# Patient Record
Sex: Female | Born: 1955 | Hispanic: Yes | Smoking: Never smoker
Health system: Southern US, Community
[De-identification: ages and names within clinical notes are randomized; demographics above are authoritative.]

## PROBLEM LIST (undated history)

## (undated) DIAGNOSIS — L97509 Non-pressure chronic ulcer of other part of unspecified foot with unspecified severity: Secondary | ICD-10-CM

## (undated) DIAGNOSIS — I10 Essential (primary) hypertension: Secondary | ICD-10-CM

## (undated) DIAGNOSIS — H539 Unspecified visual disturbance: Secondary | ICD-10-CM

## (undated) DIAGNOSIS — K275 Chronic or unspecified peptic ulcer, site unspecified, with perforation: Secondary | ICD-10-CM

## (undated) DIAGNOSIS — K219 Gastro-esophageal reflux disease without esophagitis: Secondary | ICD-10-CM

## (undated) DIAGNOSIS — K279 Peptic ulcer, site unspecified, unspecified as acute or chronic, without hemorrhage or perforation: Secondary | ICD-10-CM

## (undated) DIAGNOSIS — R413 Other amnesia: Secondary | ICD-10-CM

## (undated) DIAGNOSIS — K269 Duodenal ulcer, unspecified as acute or chronic, without hemorrhage or perforation: Secondary | ICD-10-CM

## (undated) DIAGNOSIS — M359 Systemic involvement of connective tissue, unspecified: Secondary | ICD-10-CM

## (undated) DIAGNOSIS — C4490 Unspecified malignant neoplasm of skin, unspecified: Secondary | ICD-10-CM

## (undated) DIAGNOSIS — I2692 Saddle embolus of pulmonary artery without acute cor pulmonale: Secondary | ICD-10-CM

## (undated) HISTORY — DX: Saddle embolus of pulmonary artery without acute cor pulmonale: I26.92

## (undated) HISTORY — DX: Unspecified visual disturbance: H53.9

## (undated) HISTORY — DX: Other amnesia: R41.3

## (undated) HISTORY — PX: TONSILLECTOMY: SUR1361

## (undated) HISTORY — DX: Duodenal ulcer, unspecified as acute or chronic, without hemorrhage or perforation: K26.9

## (undated) HISTORY — DX: Peptic ulcer, site unspecified, unspecified as acute or chronic, without hemorrhage or perforation: K27.9

## (undated) HISTORY — PX: DIAGNOSTIC LAPAROSCOPY: SUR761

## (undated) HISTORY — DX: Chronic or unspecified peptic ulcer, site unspecified, with perforation: K27.5

## (undated) HISTORY — DX: Essential (primary) hypertension: I10

---

## 2014-09-09 ENCOUNTER — Emergency Department (HOSPITAL_COMMUNITY)
Admission: EM | Admit: 2014-09-09 | Discharge: 2014-09-09 | Disposition: A | Discharge status: Home / Self Care | Payer: No Typology Code available for payment source | Attending: Emergency Medicine | Admitting: Emergency Medicine

## 2014-09-09 ENCOUNTER — Encounter (HOSPITAL_COMMUNITY): Payer: Self-pay | Admitting: Emergency Medicine

## 2014-09-09 ENCOUNTER — Emergency Department (HOSPITAL_COMMUNITY): Payer: No Typology Code available for payment source

## 2014-09-09 DIAGNOSIS — R0789 Other chest pain: Secondary | ICD-10-CM

## 2014-09-09 DIAGNOSIS — M542 Cervicalgia: Secondary | ICD-10-CM

## 2014-09-09 DIAGNOSIS — Y9389 Activity, other specified: Secondary | ICD-10-CM | POA: Diagnosis not present

## 2014-09-09 DIAGNOSIS — Y998 Other external cause status: Secondary | ICD-10-CM | POA: Insufficient documentation

## 2014-09-09 DIAGNOSIS — R51 Headache: Secondary | ICD-10-CM | POA: Diagnosis not present

## 2014-09-09 DIAGNOSIS — S4992XA Unspecified injury of left shoulder and upper arm, initial encounter: Secondary | ICD-10-CM | POA: Diagnosis not present

## 2014-09-09 DIAGNOSIS — S299XXA Unspecified injury of thorax, initial encounter: Secondary | ICD-10-CM | POA: Diagnosis not present

## 2014-09-09 DIAGNOSIS — Z79899 Other long term (current) drug therapy: Secondary | ICD-10-CM | POA: Insufficient documentation

## 2014-09-09 DIAGNOSIS — I10 Essential (primary) hypertension: Secondary | ICD-10-CM | POA: Diagnosis not present

## 2014-09-09 DIAGNOSIS — Y92410 Unspecified street and highway as the place of occurrence of the external cause: Secondary | ICD-10-CM | POA: Insufficient documentation

## 2014-09-09 DIAGNOSIS — S199XXA Unspecified injury of neck, initial encounter: Secondary | ICD-10-CM | POA: Diagnosis not present

## 2014-09-09 HISTORY — DX: Essential (primary) hypertension: I10

## 2014-09-09 MED ORDER — CYCLOBENZAPRINE HCL 10 MG PO TABS: 10.0000 mg | ORAL_TABLET | Freq: Two times a day (BID) | ORAL | Status: DC | PRN

## 2014-09-09 MED ORDER — NAPROXEN 500 MG PO TABS: 500.0000 mg | ORAL_TABLET | Freq: Two times a day (BID) | ORAL | Status: DC
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
## 2014-09-09 NOTE — Discharge Instructions (Signed)
X-rays were all normal. You will be sore for several days. You may find miscellaneous shards of glass.  Can shower. Medications for pain and muscle spasm.

## 2014-09-09 NOTE — ED Notes (Signed)
Removal of cervical collar cleared with Dr. Lacinda Axon.
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
## 2015-01-24 ENCOUNTER — Ambulatory Visit (INDEPENDENT_AMBULATORY_CARE_PROVIDER_SITE_OTHER): Payer: Self-pay | Admitting: Neurology

## 2015-01-24 ENCOUNTER — Encounter: Payer: Self-pay | Admitting: Neurology

## 2015-01-24 VITALS — BP 154/92 | HR 68 | Resp 16 | Ht 64.0 in | Wt 218.4 lb

## 2015-01-24 DIAGNOSIS — M542 Cervicalgia: Secondary | ICD-10-CM

## 2015-01-24 DIAGNOSIS — R413 Other amnesia: Secondary | ICD-10-CM

## 2015-01-24 DIAGNOSIS — F329 Major depressive disorder, single episode, unspecified: Secondary | ICD-10-CM

## 2015-01-24 DIAGNOSIS — F32A Depression, unspecified: Secondary | ICD-10-CM

## 2015-01-24 DIAGNOSIS — I10 Essential (primary) hypertension: Secondary | ICD-10-CM

## 2015-01-24 DIAGNOSIS — R42 Dizziness and giddiness: Secondary | ICD-10-CM

## 2015-01-24 HISTORY — DX: Essential (primary) hypertension: I10

## 2015-01-24 MED ORDER — ESCITALOPRAM OXALATE 10 MG PO TABS: 10.0000 mg | ORAL_TABLET | Freq: Every day | ORAL | Status: DC

## 2015-01-24 MED ORDER — CYCLOBENZAPRINE HCL 5 MG PO TABS: 5.0000 mg | ORAL_TABLET | Freq: Every day | ORAL | Status: DC
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
## 2015-01-25 ENCOUNTER — Telehealth: Payer: Self-pay | Admitting: *Deleted

## 2015-01-25 LAB — TSH: TSH: 1.5 u[IU]/mL (ref 0.450–4.500)

## 2015-01-25 LAB — SEDIMENTATION RATE: Sed Rate: 4 mm/hr (ref 0–40)

## 2015-01-25 LAB — VITAMIN B12: Vitamin B-12: 374 pg/mL (ref 211–946)

## 2015-01-25 NOTE — Telephone Encounter (Signed)
-----   Message from Britt Bottom, MD sent at 01/25/2015 10:09 AM EDT ----- Blood tests looked good. We'll let her know the results of the MRI after it is done.

## 2015-01-25 NOTE — Telephone Encounter (Signed)
I have spoken with Maria Murphy this morning and per RAS, advised that labs were ok; we will call with mri results once that's done.  She verbalized understanding of same/fim

## 2015-03-26 ENCOUNTER — Ambulatory Visit: Payer: Self-pay | Admitting: Neurology

## 2015-03-28 ENCOUNTER — Encounter: Payer: Self-pay | Admitting: Neurology

## 2015-04-02 ENCOUNTER — Ambulatory Visit (INDEPENDENT_AMBULATORY_CARE_PROVIDER_SITE_OTHER): Payer: Self-pay | Admitting: Neurology

## 2015-04-02 ENCOUNTER — Encounter: Payer: Self-pay | Admitting: Neurology

## 2015-04-02 VITALS — BP 160/92 | HR 86 | Resp 18 | Ht 64.0 in | Wt 217.8 lb

## 2015-04-02 DIAGNOSIS — F329 Major depressive disorder, single episode, unspecified: Secondary | ICD-10-CM

## 2015-04-02 DIAGNOSIS — M542 Cervicalgia: Secondary | ICD-10-CM

## 2015-04-02 DIAGNOSIS — F32A Depression, unspecified: Secondary | ICD-10-CM

## 2015-04-02 DIAGNOSIS — R413 Other amnesia: Secondary | ICD-10-CM

## 2015-04-02 DIAGNOSIS — Z0289 Encounter for other administrative examinations: Secondary | ICD-10-CM

## 2015-04-02 DIAGNOSIS — R42 Dizziness and giddiness: Secondary | ICD-10-CM

## 2015-04-02 MED ORDER — ESCITALOPRAM OXALATE 20 MG PO TABS
ORAL_TABLET | ORAL | Status: DC
Start: 2015-04-02 — End: 2017-12-06
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
## 2017-10-25 ENCOUNTER — Encounter: Payer: Self-pay | Admitting: Obstetrics & Gynecology

## 2017-10-25 ENCOUNTER — Ambulatory Visit (INDEPENDENT_AMBULATORY_CARE_PROVIDER_SITE_OTHER): Payer: Medicaid Other | Admitting: Obstetrics & Gynecology

## 2017-10-25 ENCOUNTER — Encounter (INDEPENDENT_AMBULATORY_CARE_PROVIDER_SITE_OTHER): Payer: Self-pay

## 2017-10-25 VITALS — BP 130/80 | HR 96 | Ht 64.0 in | Wt 215.0 lb

## 2017-10-25 DIAGNOSIS — N814 Uterovaginal prolapse, unspecified: Secondary | ICD-10-CM | POA: Diagnosis not present

## 2017-10-25 NOTE — Progress Notes (Signed)
Chief Complaint  Patient presents with  . work-in-gyn    prolapsed uterus      62 y.o. No obstetric history on file. No LMP recorded. Patient is postmenopausal. The current method of family planning is post menopausal status.  Outpatient Encounter Medications as of 10/25/2017  Medication Sig  . cyclobenzaprine (FLEXERIL) 5 MG tablet Take 1 tablet (5 mg total) by mouth at bedtime. (Patient not taking: Reported on 10/25/2017)  . escitalopram (LEXAPRO) 20 MG tablet One po daily (Patient not taking: Reported on 10/25/2017)  . naproxen (NAPROSYN) 500 MG tablet Take 1 tablet (500 mg total) by mouth 2 (two) times daily. (Patient not taking: Reported on 10/25/2017)  . valsartan (DIOVAN) 320 MG tablet Take 320 mg by mouth daily.   No facility-administered encounter medications on file as of 10/25/2017.     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 Essential hypertension, benign 01/24/2015  . Hypertension   . Memory loss   . Vision abnormalities     Past Surgical History:  Procedure Laterality Date  . TONSILLECTOMY      OB History    No data available      No Known Allergies  Social History   Socioeconomic History  . Marital status: Widowed    Spouse name: None  . Number of children: None  . Years of education: None  . Highest education level: None  Social Needs  . Financial resource strain: None  . Food insecurity - worry: None  . Food insecurity - inability: None  . Transportation needs - medical: None  . Transportation needs - non-medical: None  Occupational History  . None  Tobacco Use  . Smoking status: Never Smoker  . Smokeless tobacco: Never Used  Substance and Sexual Activity  . Alcohol  use: No    Alcohol/week: 0.6 oz    Types: 1 Glasses of wine per week    Frequency: Never  . Drug use: No  . Sexual activity: No  Other Topics Concern  . None  Social History Narrative  . None    Family History  Problem Relation Age of Onset  . High Cholesterol Mother   . Hypertension Mother   . Arthritis/Rheumatoid Mother   . Alcohol abuse Father     Medications:       Current Outpatient Medications:  .  cyclobenzaprine (FLEXERIL) 5 MG tablet, Take 1 tablet (5 mg total) by mouth at bedtime. (Patient not taking: Reported on 10/25/2017), Disp: 30 tablet, Rfl: 3 .  escitalopram (LEXAPRO) 20 MG tablet, One po daily (Patient not taking: Reported on 10/25/2017), Disp: 90 tablet, Rfl: 3 .  naproxen (NAPROSYN) 500 MG tablet, Take 1 tablet (500 mg total) by mouth 2 (two) times daily. (Patient not taking: Reported on 10/25/2017), Disp: 20 tablet, Rfl: 0 .  valsartan (DIOVAN) 320 MG tablet, Take 320 mg by mouth daily., Disp: , Rfl:   Objective Blood pressure 130/80, pulse 96, height 5\' 4"  (1.626 m), weight 215 lb (97.5 kg).  General WDWN female NAD Vulva:  normal appearing vulva with no masses, tenderness or lesions Vagina:  Grade 2 cystocoele, grade 2 uterine prolapse, rectum is well supported Cervix:  no cervical motion tenderness and no  lesions Uterus:  normal size, contour, position, consistency, mobility, non-tender Adnexa: ovaries:present,     Pertinent ROS No burning with urination, frequency or urgency No nausea, vomiting or diarrhea Nor fever chills or other constitutional symptoms   Labs or studies     Impression Diagnoses this Encounter::   ICD-10-CM   1. 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   Labs or Scans Ordered: No orders of the defined types were placed in this encounter.   Management:: milex ring with support #4 is ordered  today, will see back in 10 days to place in vagina  Follow up Return in about 10 days (around 11/04/2017) for Follow up, with Dr Elonda Husky.      All questions were answered.

## 2017-10-28 ENCOUNTER — Telehealth: Payer: Self-pay | Admitting: *Deleted

## 2017-10-28 NOTE — Telephone Encounter (Signed)
Pt requesting letter for work stating that she should not be doing heavy lifting. Informed pt that letter would be available for her to pick up at her convenience.

## 2017-11-04 ENCOUNTER — Ambulatory Visit: Payer: Medicaid Other | Admitting: Obstetrics & Gynecology

## 2017-11-04 ENCOUNTER — Encounter: Payer: Self-pay | Admitting: Obstetrics & Gynecology

## 2017-11-04 VITALS — BP 136/78 | HR 92 | Ht 64.0 in | Wt 212.0 lb

## 2017-11-04 DIAGNOSIS — N814 Uterovaginal prolapse, unspecified: Secondary | ICD-10-CM | POA: Diagnosis not present

## 2017-11-04 NOTE — Progress Notes (Signed)
Chief Complaint  Patient presents with  . Follow-up      62 y.o. No obstetric history on file. No LMP recorded. Patient is postmenopausal. The current method of family planning is post menopausal status.  Outpatient Encounter Medications as of 11/04/2017  Medication Sig  . valsartan (DIOVAN) 320 MG tablet Take 320 mg by mouth daily.  . cyclobenzaprine (FLEXERIL) 5 MG tablet Take 1 tablet (5 mg total) by mouth at bedtime. (Patient not taking: Reported on 10/25/2017)  . escitalopram (LEXAPRO) 20 MG tablet One po daily (Patient not taking: Reported on 10/25/2017)  . naproxen (NAPROSYN) 500 MG tablet Take 1 tablet (500 mg total) by mouth 2 (two) times daily. (Patient not taking: Reported on 10/25/2017)   No facility-administered encounter medications on file as of 11/04/2017.     Subjective Maria Murphy  Is here today to place her pessary for her cystocele with uterine prolapse as well She continues to have the same symptoms of pressure in the sensation that her bladder is hanging out The pain is vaginal its moderate severity last all the time Past Medical History:  Diagnosis Date  . Essential hypertension, benign 01/24/2015  . Hypertension   . Memory loss   . Vision abnormalities     Past Surgical History:  Procedure Laterality Date  . TONSILLECTOMY      OB History    No data available      No Known Allergies  Social History   Socioeconomic History  . Marital status: Widowed    Spouse name: None  . Number of children: None  . Years of education: None  . Highest education level: None  Social Needs  . Financial resource strain: None  . Food insecurity - worry: None  . Food insecurity - inability: None  . Transportation needs - medical: None  . Transportation needs - non-medical: None  Occupational History  . None  Tobacco Use  . Smoking status: Never Smoker  . Smokeless tobacco: Never Used  Substance and Sexual Activity  . Alcohol use: No    Alcohol/week:  0.6 oz    Types: 1 Glasses of wine per week    Frequency: Never  . Drug use: No  . Sexual activity: No  Other Topics Concern  . None  Social History Narrative  . None    Family History  Problem Relation Age of Onset  . High Cholesterol Mother   . Hypertension Mother   . Arthritis/Rheumatoid Mother   . Alcohol abuse Father     Medications:       Current Outpatient Medications:  .  valsartan (DIOVAN) 320 MG tablet, Take 320 mg by mouth daily., Disp: , Rfl:  .  cyclobenzaprine (FLEXERIL) 5 MG tablet, Take 1 tablet (5 mg total) by mouth at bedtime. (Patient not taking: Reported on 10/25/2017), Disp: 30 tablet, Rfl: 3 .  escitalopram (LEXAPRO) 20 MG tablet, One po daily (Patient not taking: Reported on 10/25/2017), Disp: 90 tablet, Rfl: 3 .  naproxen (NAPROSYN) 500 MG tablet, Take 1 tablet (500 mg total) by mouth 2 (two) times daily. (Patient not taking: Reported on 10/25/2017), Disp: 20 tablet, Rfl: 0  Objective Blood pressure 136/78, pulse 92, height 5\' 4"  (1.626 m), weight 212 lb (96.2 kg).  Neuro well-developed well-nourished female no acute distress slightly obese The Milex ring with support #4 is placed without difficulty The fitting is good with good seating and no inappropriate mucosal pressure The patient states it is comfortable and  she cannot tell as there  Pertinent ROS   Labs or studies     Impression Diagnoses this Encounter::   ICD-10-CM   1. 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   Labs or Scans Ordered: No orders of the defined types were placed in this encounter.   Management:: Placement of Milex ring with support #4 today follow-up in a month to see how she is doing with that Call prior if needed  Follow up Return in about 1 month (around 12/05/2017) for Follow up, with Dr Elonda Husky.      All questions were answered.

## 2017-11-05 ENCOUNTER — Encounter: Payer: Self-pay | Admitting: Obstetrics & Gynecology

## 2017-12-06 ENCOUNTER — Ambulatory Visit: Payer: Self-pay | Admitting: Obstetrics & Gynecology

## 2017-12-06 ENCOUNTER — Other Ambulatory Visit: Payer: Self-pay

## 2017-12-06 ENCOUNTER — Encounter: Payer: Self-pay | Admitting: Obstetrics & Gynecology

## 2017-12-06 ENCOUNTER — Ambulatory Visit (INDEPENDENT_AMBULATORY_CARE_PROVIDER_SITE_OTHER): Payer: Medicaid Other | Admitting: Obstetrics & Gynecology

## 2017-12-06 VITALS — BP 122/74 | HR 72 | Ht 64.0 in | Wt 208.0 lb

## 2017-12-06 DIAGNOSIS — Z4689 Encounter for fitting and adjustment of other specified devices: Secondary | ICD-10-CM | POA: Diagnosis not present

## 2017-12-06 DIAGNOSIS — N814 Uterovaginal prolapse, unspecified: Secondary | ICD-10-CM | POA: Diagnosis not present

## 2017-12-06 NOTE — Progress Notes (Signed)
Chief Complaint  Patient presents with  . pessary maintanence    Blood pressure 122/74, pulse 72, height 5\' 4"  (1.626 m), weight 208 lb (94.3 kg).  Maria Murphy presents today for routine follow up related to her pessary.   She uses a Milex ring with support #4 She reports no vaginal discharge or vaginal bleeding.  Exam reveals no undue vaginal mucosal pressure of breakdown, no discharge and no vaginal bleeding.  The pessary is removed, cleaned and replaced without difficulty.    Maria Murphy will be sen back in 3 months for continued follow up.  Maria Buff, MD  12/06/2017 2:19 PM

## 2018-03-07 ENCOUNTER — Ambulatory Visit: Payer: Medicaid Other | Admitting: Obstetrics & Gynecology

## 2018-06-16 ENCOUNTER — Other Ambulatory Visit: Payer: Self-pay

## 2018-06-16 ENCOUNTER — Emergency Department (HOSPITAL_COMMUNITY)
Admission: EM | Admit: 2018-06-16 | Discharge: 2018-06-16 | Disposition: A | Discharge status: Home / Self Care | Payer: Medicaid Other | Attending: Emergency Medicine | Admitting: Emergency Medicine

## 2018-06-16 ENCOUNTER — Encounter (HOSPITAL_COMMUNITY): Payer: Self-pay

## 2018-06-16 DIAGNOSIS — I10 Essential (primary) hypertension: Secondary | ICD-10-CM | POA: Diagnosis not present

## 2018-06-16 DIAGNOSIS — F329 Major depressive disorder, single episode, unspecified: Secondary | ICD-10-CM | POA: Insufficient documentation

## 2018-06-16 DIAGNOSIS — Z79899 Other long term (current) drug therapy: Secondary | ICD-10-CM | POA: Insufficient documentation

## 2018-06-16 DIAGNOSIS — G8929 Other chronic pain: Secondary | ICD-10-CM | POA: Insufficient documentation

## 2018-06-16 DIAGNOSIS — M25562 Pain in left knee: Secondary | ICD-10-CM | POA: Insufficient documentation

## 2018-06-16 MED ORDER — OXYCODONE-ACETAMINOPHEN 5-325 MG PO TABS: 1.0000 | ORAL_TABLET | Freq: Four times a day (QID) | ORAL | 0 refills | Status: DC | PRN

## 2018-06-16 MED ORDER — METHYLPREDNISOLONE ACETATE 80 MG/ML IJ SUSP
40.0000 mg | Freq: Once | INTRAMUSCULAR | Status: AC
  Administered 2018-06-16: 40 mg via INTRA_ARTICULAR
  Filled 2018-06-16: qty 1

## 2018-06-16 MED ORDER — BUPIVACAINE HCL (PF) 0.25 % IJ SOLN
5.0000 mL | Freq: Once | INTRAMUSCULAR | Status: AC
  Administered 2018-06-16: 5 mL
  Filled 2018-06-16: qty 30

## 2018-06-16 NOTE — ED Notes (Signed)
Pt ambulatory to waiting room. Pt verbalized understanding of discharge instructions.   

## 2018-06-16 NOTE — ED Triage Notes (Signed)
Pt c/o chronic left knee pain but worse since yesterday.  Denies injury.  Pt tearful.  Pt says has been diagnosed with arthritis in knee.
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
## 2018-07-05 ENCOUNTER — Other Ambulatory Visit (HOSPITAL_COMMUNITY): Payer: Self-pay | Admitting: Internal Medicine

## 2018-07-05 DIAGNOSIS — Z1231 Encounter for screening mammogram for malignant neoplasm of breast: Secondary | ICD-10-CM

## 2018-07-15 ENCOUNTER — Ambulatory Visit (HOSPITAL_COMMUNITY): Payer: Medicaid Other

## 2018-07-20 ENCOUNTER — Ambulatory Visit: Payer: Medicaid Other | Admitting: Orthopedic Surgery

## 2018-07-20 ENCOUNTER — Encounter: Payer: Self-pay | Admitting: Orthopedic Surgery

## 2018-07-25 ENCOUNTER — Ambulatory Visit: Payer: Medicaid Other

## 2018-07-25 ENCOUNTER — Telehealth: Payer: Self-pay

## 2018-07-25 NOTE — Telephone Encounter (Signed)
PATIENT WAS A NO SHOW AND LETTER SENT  °

## 2018-07-25 NOTE — Telephone Encounter (Signed)
noted 

## 2018-07-27 ENCOUNTER — Ambulatory Visit: Payer: Medicaid Other | Admitting: Orthopedic Surgery

## 2018-08-10 ENCOUNTER — Emergency Department (HOSPITAL_COMMUNITY): Payer: Medicaid Other

## 2018-08-10 ENCOUNTER — Encounter (HOSPITAL_COMMUNITY): Payer: Self-pay | Admitting: Emergency Medicine

## 2018-08-10 ENCOUNTER — Emergency Department (HOSPITAL_COMMUNITY)
Admission: EM | Admit: 2018-08-10 | Discharge: 2018-08-10 | Disposition: A | Discharge status: Home / Self Care | Payer: Medicaid Other | Attending: Emergency Medicine | Admitting: Emergency Medicine

## 2018-08-10 ENCOUNTER — Other Ambulatory Visit: Payer: Self-pay

## 2018-08-10 DIAGNOSIS — I10 Essential (primary) hypertension: Secondary | ICD-10-CM | POA: Insufficient documentation

## 2018-08-10 DIAGNOSIS — M1711 Unilateral primary osteoarthritis, right knee: Secondary | ICD-10-CM | POA: Diagnosis not present

## 2018-08-10 DIAGNOSIS — M25561 Pain in right knee: Secondary | ICD-10-CM | POA: Diagnosis present

## 2018-08-10 MED ORDER — OXYCODONE-ACETAMINOPHEN 5-325 MG PO TABS: 1.0000 | ORAL_TABLET | ORAL | 0 refills | Status: DC | PRN

## 2018-08-10 MED ORDER — ACETAMINOPHEN 500 MG PO TABS: 500.0000 mg | ORAL_TABLET | Freq: Four times a day (QID) | ORAL | 0 refills | Status: DC | PRN

## 2018-08-10 MED ORDER — IBUPROFEN 400 MG PO TABS: 400.0000 mg | ORAL_TABLET | Freq: Four times a day (QID) | ORAL | 0 refills | Status: DC | PRN

## 2018-08-10 NOTE — ED Notes (Signed)
Advised patient not to drive while taking prescription pain medication. Patient verbalized understanding.

## 2018-08-10 NOTE — Discharge Instructions (Signed)
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  As discussed,  a heating pad can help with your arthritis pain.   Also, you can take a combination of the ibuprofen prescribed with the tylenol in place of the oxycodone - this can give fair pain relief without the drowsiness.  Be aware that oxycodone can cause constipation - if you are prone to this you may want to consider taking a stool softener while on this medicine.  Your xrays show you have very severe arthritis in your knee.  Dr. Aline Brochure can help you further with this problem (he may suggest surgery as discussed).

## 2018-08-10 NOTE — ED Triage Notes (Signed)
Pt c/o right knee pain x 3 days and right ear pain, pt reports hx of arthritis, usually takes Ibuprofen but has not helped
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
## 2018-08-25 ENCOUNTER — Other Ambulatory Visit: Payer: Medicaid Other | Admitting: Obstetrics & Gynecology

## 2018-09-16 ENCOUNTER — Encounter: Payer: Self-pay | Admitting: Orthopedic Surgery

## 2018-09-16 ENCOUNTER — Ambulatory Visit: Payer: Medicaid Other | Admitting: Orthopedic Surgery

## 2018-09-16 ENCOUNTER — Ambulatory Visit (INDEPENDENT_AMBULATORY_CARE_PROVIDER_SITE_OTHER): Payer: Medicaid Other

## 2018-09-16 VITALS — BP 112/79 | HR 77 | Ht 64.0 in | Wt 212.0 lb

## 2018-09-16 DIAGNOSIS — M25562 Pain in left knee: Secondary | ICD-10-CM | POA: Diagnosis not present

## 2018-09-16 DIAGNOSIS — M25561 Pain in right knee: Secondary | ICD-10-CM

## 2018-09-16 DIAGNOSIS — G8929 Other chronic pain: Secondary | ICD-10-CM

## 2018-09-16 NOTE — Progress Notes (Signed)
Marine Lezotte  09/16/2018  HISTORY SECTION :  Chief Complaint  Patient presents with  . Knee Pain    Bilat but right worse   HPI The patient presents for evaluation of bilateral knee pain right worse than left  Location diffuse bilateral knee pain right more than left Duration over 5 years Quality dull aching pain Severity 10 out of 10 at its worse Associated with difficulty walking, difficulty getting up and down from a chair getting in and out of a car going up and down the steps  Prior treatment includes Aleve and ibuprofen, she is using a cane.  She had to retire from work because of issues with her knees   Review of Systems  Musculoskeletal: Positive for myalgias.  All other systems reviewed and are negative.   Past Medical History:  Diagnosis Date  . Essential hypertension, benign 01/24/2015  . Hypertension   . Memory loss   . Vision abnormalities     Past Surgical History:  Procedure Laterality Date  . TONSILLECTOMY      Social History   Tobacco Use  . Smoking status: Never Smoker  . Smokeless tobacco: Never Used  Substance Use Topics  . Alcohol use: No    Alcohol/week: 1.0 standard drinks    Types: 1 Glasses of wine per week    Frequency: Never  . Drug use: No    Family History  Problem Relation Age of Onset  . High Cholesterol Mother   . Hypertension Mother   . Arthritis/Rheumatoid Mother   . Alcohol abuse Father       Allergies  Allergen Reactions  . Wheat Extract Swelling     Current Outpatient Medications:  .  acetaminophen (TYLENOL) 500 MG tablet, Take 1 tablet (500 mg total) by mouth every 6 (six) hours as needed., Disp: 30 tablet, Rfl: 0 .  citalopram (CELEXA) 20 MG tablet, Take 20 mg by mouth daily., Disp: , Rfl:  .  ibuprofen (ADVIL,MOTRIN) 400 MG tablet, Take 1 tablet (400 mg total) by mouth every 6 (six) hours as needed for moderate pain., Disp: 30 tablet, Rfl: 0 .  lisinopril-hydrochlorothiazide (PRINZIDE,ZESTORETIC)  10-12.5 MG tablet, Take 1 tablet by mouth daily., Disp: , Rfl:  .  oxyCODONE-acetaminophen (PERCOCET/ROXICET) 5-325 MG tablet, Take 1 tablet by mouth every 4 (four) hours as needed., Disp: 20 tablet, Rfl: 0 .  traZODone (DESYREL) 50 MG tablet, Take 50 mg by mouth at bedtime., Disp: , Rfl:  .  Ginger, Zingiber officinalis, (GINGER PO), Take 1 tablet by mouth daily., Disp: , Rfl:    PHYSICAL EXAM SECTION: BP 112/79   Pulse 77   Ht 5\' 4"  (1.626 m)   Wt 212 lb (96.2 kg)   BMI 36.39 kg/m   Body mass index is 36.39 kg/m. General appearance: Well-developed well-nourished no gross deformities  Eyes clear normal vision no evidence of conjunctivitis or jaundice, extraocular muscles intact  ENT: ears hearing normal, nasal passages clear, throat clear   Lymph nodes: No lymphadenopathy  Neck is supple without palpable mass, full range of motion  Cardiovascular normal pulse and perfusion in all 4 extremities normal color without edema  Neurologically deep tendon reflexes are equal and normal, no sensation loss or deficits no pathologic reflexes  Psychological: Awake alert and oriented x3 mood and affect normal  Skin no lacerations or ulcerations no nodularity no palpable masses, no erythema or nodularity  Musculoskeletal:   Upper extremities skin is normal.  There are no sensory deficits.  Color  capillary refill is normal.  Alignment is normal.  There is no contracture subluxation atrophy or tremor.  Right knee right knee flexion is 0-60 tenderness medial lateral joint line and patellofemoral compartment Strength normal quadriceps ACL and PCL stable Edema Normal sensation, skin normal Left knee left knee flexion is 0-100 patellofemoral tenderness medial lateral joint line tenderness Quad strength normal Stable all planes Edema Normal sensation, skin normal MEDICAL DECISION SECTION:  Encounter Diagnosis  Name Primary?  . Chronic pain of both knees Yes    Imaging Have an image of  the right knee which is notable for severe arthritis severe osteophyte formation severe joint space narrowing widening of the femoral bone consistent with severe degenerative arthritis the alignment is mild varus  I took a new image of the left knee it shows severe arthritis of the knee multiple osteophytes secondary bone changes joint space narrowing, the knee looks horrible  Plan:   The patient needs bilateral total knees in sequential fashion   (Rx., Inj., surg., Frx, MRI/CT, XR:2) # 1 preop medical clearance seen by PMD  #2 need MCaid preop confirmation of therapy for 6 weeks   #3 needs Medicaid preop approval for 2 to 3 weeks in skilled nursing 10:33 AM

## 2018-09-16 NOTE — Patient Instructions (Signed)
Total Knee Replacement Total knee replacement is a procedure to replace the knee joint with an artificial (prosthetic) knee joint. The purpose of this surgery is to reduce knee pain and improve knee function. The prosthetic knee joint (prosthesis) may be made of metal, plastic or ceramic. It replaces parts of the thigh bone (femur), lower leg bone (tibia), and kneecap (patella) that are removed during the procedure. Tell a health care provider about:  Any allergies you have.  All medicines you are taking, including vitamins, herbs, eye drops, creams, and over-the-counter medicines.  Any problems you or family members have had with anesthetic medicines.  Any blood disorders you have.  Any surgeries you have had.  Any medical conditions you have.  Whether you are pregnant or may be pregnant. What are the risks? Generally, this is a safe procedure. However, problems may occur, including:  Infection.  Bleeding.  Allergic reactions to medicines.  Damage to other structures or organs.  Decreased range of motion of the knee.  Instability of the knee.  Loosening of the prosthetic joint.  Knee pain that does not go away (chronic pain). What happens before the procedure? Staying hydrated Follow instructions from your health care provider about hydration, which may include:  Up to 2 hours before the procedure - you may continue to drink clear liquids, such as water, clear fruit juice, black coffee, and plain tea.  Eating and drinking restrictions Follow instructions from your health care provider about eating and drinking, which may include:  8 hours before the procedure - stop eating heavy meals or foods such as meat, fried foods, or fatty foods.  6 hours before the procedure - stop eating light meals or foods, such as toast or cereal.  6 hours before the procedure - stop drinking milk or drinks that contain milk.  2 hours before the procedure - stop drinking clear  liquids. Medicines  Ask your health care provider about: ? Changing or stopping your regular medicines. This is especially important if you are taking diabetes medicines or blood thinners. ? Taking medicines such as aspirin and ibuprofen. These medicines can thin your blood. Do not take these medicines before your procedure if your health care provider instructs you not to.  You may be given antibiotic medicine to help prevent infection. General instructions  Have dental care and routine cleanings completed before your procedure. Plan to not have dental work done for 3 months after your procedure. Germs from anywhere in your body, including your mouth, can travel to your new joint and infect it.  Ask your health care provider how your surgical site will be marked or identified.  If your health care provider prescribes physical therapy, do exercises as instructed.  Do not use any tobacco products, such as cigarettes, chewing tobacco, or e-cigarettes. If you need help quitting, ask your health care provider.  You may have a physical exam.  You may have tests, such as: ? X-rays. ? MRI. ? CT scan. ? Bone scans.  You may have a blood or urine sample taken.  Plan to have someone take you home after the procedure.  If you will be going home right after the procedure, plan to have someone with you for at least 24 hours. It is recommended that you have someone to help care for you for at least 4-6 weeks after your procedure. What happens during the procedure?   To reduce your risk of infection: ? Your health care team will wash or sanitize their  hands. ? Your skin will be washed with soap.  An IV tube will be inserted into one of your veins.  You will be given one or more of the following: ? A medicine to help you relax (sedative). ? A medicine to numb the area (local anesthetic). ? A medicine to make you fall asleep (general anesthetic). ? A medicine that is injected into your  spine to numb the area below and slightly above the injection site (spinal anesthetic). ? A medicine that is injected into an area of your body to numb everything below the injection site (regional anesthetic).  An incision will be made in your knee.  Damaged cartilage and bone will be removed from your femur, tibia, and patella.  Parts of the prosthesis (liners) will be placed over the areas of bone and cartilage that were removed. A metal liner will be placed over your femur, and plastic liners will be placed over your tibia and the underside of your patella.  One or more small tubes (drains) may be placed near your incision to help drain extra fluid from your surgical site.  Your incision will be closed with stitches (sutures), skin glue, or adhesive strips. Medicine may be applied to your incision.  A bandage (dressing) will be placed over your incision. The procedure may vary among health care providers and hospitals. What happens after the procedure?  Your blood pressure, heart rate, breathing rate, and blood oxygen level will be monitored often until the medicines you were given have worn off.  You may continue to receive fluids and medicines through an IV tube.  You will have some pain. Pain medicines will be available to help you.  You may have fluid coming from one or more drains in your incision.  You may have to wear compression stockings. These stockings help to prevent blood clots and reduce swelling in your legs.  You will be encouraged to move around as much as possible.  You may be given a continuous passive motion machine to use at home. You will be shown how to use this machine.  Do not drive for 24 hours if you received a sedative. This information is not intended to replace advice given to you by your health care provider. Make sure you discuss any questions you have with your health care provider. Document Released: 11/16/2000 Document Revised: 01/28/2017  Document Reviewed: 07/17/2015 Elsevier Interactive Patient Education  2019 [employer].

## 2018-10-13 ENCOUNTER — Ambulatory Visit: Payer: Medicaid Other | Admitting: Obstetrics & Gynecology

## 2018-10-20 ENCOUNTER — Ambulatory Visit (INDEPENDENT_AMBULATORY_CARE_PROVIDER_SITE_OTHER): Payer: Medicaid Other | Admitting: Obstetrics & Gynecology

## 2018-10-20 ENCOUNTER — Encounter: Payer: Self-pay | Admitting: Obstetrics & Gynecology

## 2018-10-20 ENCOUNTER — Other Ambulatory Visit: Payer: Self-pay

## 2018-10-20 VITALS — BP 130/83 | HR 64 | Ht 64.0 in | Wt 204.0 lb

## 2018-10-20 DIAGNOSIS — N814 Uterovaginal prolapse, unspecified: Secondary | ICD-10-CM | POA: Diagnosis not present

## 2018-10-20 NOTE — Progress Notes (Signed)
Chief Complaint  Patient presents with  . Follow-up    doesnt like pessary      63 y.o. G2P2 No LMP recorded. Patient is postmenopausal. The current method of family planning is post menopausal status.  Outpatient Encounter Medications as of 10/20/2018  Medication Sig  . acetaminophen (TYLENOL) 500 MG tablet Take 1 tablet (500 mg total) by mouth every 6 (six) hours as needed.  . citalopram (CELEXA) 20 MG tablet Take 20 mg by mouth daily.  . Ginger, Zingiber officinalis, (GINGER PO) Take 1 tablet by mouth daily.  Marland Kitchen ibuprofen (ADVIL,MOTRIN) 400 MG tablet Take 1 tablet (400 mg total) by mouth every 6 (six) hours as needed for moderate pain.  Marland Kitchen lisinopril-hydrochlorothiazide (PRINZIDE,ZESTORETIC) 10-12.5 MG tablet Take 1 tablet by mouth daily.  Marland Kitchen oxyCODONE-acetaminophen (PERCOCET/ROXICET) 5-325 MG tablet Take 1 tablet by mouth every 4 (four) hours as needed.  . traZODone (DESYREL) 50 MG tablet Take 50 mg by mouth at bedtime.   No facility-administered encounter medications on file as of 10/20/2018.     Subjective Pt took her pessary out Does not like the discharge associated Wants permanent solution Past Medical History:  Diagnosis Date  . Essential hypertension, benign 01/24/2015  . Hypertension   . Memory loss   . Vision abnormalities     Past Surgical History:  Procedure Laterality Date  . TONSILLECTOMY      OB History    Gravida  2   Para  2   Term      Preterm      AB      Living  2     SAB      TAB      Ectopic      Multiple      Live Births              Allergies  Allergen Reactions  . Wheat Extract Swelling    Social History   Socioeconomic History  . Marital status: Widowed    Spouse name: Not on file  . Number of children: Not on file  . Years of education: Not on file  . Highest education level: Not on file  Occupational History  . Not on file  Social Needs  . Financial resource strain: Not on file  . Food insecurity:      Worry: Not on file    Inability: Not on file  . Transportation needs:    Medical: Not on file    Non-medical: Not on file  Tobacco Use  . Smoking status: Never Smoker  . Smokeless tobacco: Never Used  Substance and Sexual Activity  . Alcohol use: No    Alcohol/week: 1.0 standard drinks    Types: 1 Glasses of wine per week    Frequency: Never  . Drug use: No  . Sexual activity: Never  Lifestyle  . Physical activity:    Days per week: Not on file    Minutes per session: Not on file  . Stress: Not on file  Relationships  . Social connections:    Talks on phone: Not on file    Gets together: Not on file    Attends religious service: Not on file    Active member of club or organization: Not on file    Attends meetings of clubs or organizations: Not on file    Relationship status: Not on file  Other Topics Concern  . Not on file  Social History Narrative  .  Not on file    Family History  Problem Relation Age of Onset  . High Cholesterol Mother   . Hypertension Mother   . Arthritis/Rheumatoid Mother   . Alcohol abuse Father     Medications:       Current Outpatient Medications:  .  acetaminophen (TYLENOL) 500 MG tablet, Take 1 tablet (500 mg total) by mouth every 6 (six) hours as needed., Disp: 30 tablet, Rfl: 0 .  citalopram (CELEXA) 20 MG tablet, Take 20 mg by mouth daily., Disp: , Rfl:  .  Ginger, Zingiber officinalis, (GINGER PO), Take 1 tablet by mouth daily., Disp: , Rfl:  .  ibuprofen (ADVIL,MOTRIN) 400 MG tablet, Take 1 tablet (400 mg total) by mouth every 6 (six) hours as needed for moderate pain., Disp: 30 tablet, Rfl: 0 .  lisinopril-hydrochlorothiazide (PRINZIDE,ZESTORETIC) 10-12.5 MG tablet, Take 1 tablet by mouth daily., Disp: , Rfl:  .  oxyCODONE-acetaminophen (PERCOCET/ROXICET) 5-325 MG tablet, Take 1 tablet by mouth every 4 (four) hours as needed., Disp: 20 tablet, Rfl: 0 .  traZODone (DESYREL) 50 MG tablet, Take 50 mg by mouth at bedtime., Disp: , Rfl:    Objective Blood pressure 130/83, pulse 64, height 5\' 4"  (1.626 m), weight 204 lb (92.5 kg).  Gen WDWN NAD  Pertinent ROS No burning with urination, frequency or urgency No nausea, vomiting or diarrhea Nor fever chills or other constitutional symptoms   Labs or studies none    Impression Diagnoses this Encounter::   ICD-10-CM   1. 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   Labs or Scans Ordered: No orders of the defined types were placed in this encounter.   Management:: >recommend TVH cystocoele repair with VV suspension > pt will consider and re contact with her decision  Follow up Return if symptoms worsen or fail to improve, for ball is in her court.        Face to face time:  15 minutes  Greater than 50% of the visit time was spent in counseling and coordination of care with the patient.  The summary and outline of the counseling and care coordination is summarized in the note above.   All questions were answered.

## 2018-10-21 ENCOUNTER — Encounter: Payer: Self-pay | Admitting: Obstetrics & Gynecology

## 2018-11-01 ENCOUNTER — Other Ambulatory Visit: Payer: Self-pay | Admitting: Obstetrics & Gynecology

## 2018-11-30 ENCOUNTER — Telehealth (INDEPENDENT_AMBULATORY_CARE_PROVIDER_SITE_OTHER): Payer: Self-pay | Admitting: Radiology

## 2018-11-30 NOTE — Telephone Encounter (Signed)
I left voicemail for patient requesting return call to confirm appointment on 12/01/2018 in the [place] office and to review COVID-19 screening questions.

## 2018-12-01 ENCOUNTER — Ambulatory Visit (INDEPENDENT_AMBULATORY_CARE_PROVIDER_SITE_OTHER): Payer: Medicaid Other | Admitting: Orthopaedic Surgery

## 2018-12-01 ENCOUNTER — Other Ambulatory Visit: Payer: Self-pay

## 2018-12-06 ENCOUNTER — Other Ambulatory Visit: Payer: Medicaid Other | Admitting: Obstetrics & Gynecology

## 2018-12-18 ENCOUNTER — Encounter: Payer: Self-pay | Admitting: Obstetrics & Gynecology

## 2018-12-18 ENCOUNTER — Encounter: Payer: Self-pay | Admitting: Orthopedic Surgery

## 2018-12-19 ENCOUNTER — Other Ambulatory Visit: Payer: Self-pay | Admitting: Orthopedic Surgery

## 2018-12-19 ENCOUNTER — Telehealth: Payer: Self-pay | Admitting: Orthopedic Surgery

## 2018-12-19 DIAGNOSIS — M25561 Pain in right knee: Principal | ICD-10-CM

## 2018-12-19 DIAGNOSIS — G8929 Other chronic pain: Secondary | ICD-10-CM

## 2018-12-19 DIAGNOSIS — M25562 Pain in left knee: Principal | ICD-10-CM

## 2018-12-19 MED ORDER — TRAMADOL-ACETAMINOPHEN 37.5-325 MG PO TABS: 1.0000 | ORAL_TABLET | ORAL | 0 refills | Status: AC | PRN

## 2018-12-19 NOTE — Telephone Encounter (Signed)
Maria Murphy called and stated that her knee is still hurting her and she wants something for pain.  She is just having to wait until after the restrictions are lifted to do anything further regarding her knee pain  She uses [facility]

## 2018-12-20 NOTE — Telephone Encounter (Signed)
She needed clearance from PCP for total knee Has not done this Do you want her to schedule with you?

## 2018-12-20 NOTE — Telephone Encounter (Signed)
Patient also requesting in-person appointment with Dr Aline Brochure due to bilateral knee pain - said someone did try to call her back, and she missed the call.

## 2018-12-21 NOTE — Telephone Encounter (Signed)
I called her, she needs to see her PCP for clearance, meds from PCP   Left message for her to call back so we can advise.

## 2018-12-21 NOTE — Telephone Encounter (Signed)
You told her she  needs bilateral total knees in sequential fashion   (Rx., Inj., surg., Frx, MRI/CT, XR:2) # 1 preop medical clearance seen by PMD  #2 need MCaid preop confirmation of therapy for 6 weeks   #3 needs Medicaid preop approval for 2 to 3 weeks in skilled nursing   She has declined to get clearance from primary care, so everything just sitting. She has called to request medication. Now is requesting appointment.

## 2018-12-21 NOTE — Telephone Encounter (Signed)
No clearance firts

## 2018-12-21 NOTE — Telephone Encounter (Signed)
For??? Surgery?injection?

## 2018-12-23 NOTE — Telephone Encounter (Signed)
Patient aware.

## 2019-01-12 ENCOUNTER — Other Ambulatory Visit: Payer: Medicaid Other | Admitting: Obstetrics & Gynecology

## 2019-02-02 ENCOUNTER — Other Ambulatory Visit: Payer: Medicaid Other | Admitting: Obstetrics & Gynecology

## 2019-02-09 ENCOUNTER — Telehealth: Payer: Self-pay | Admitting: Adult Health

## 2019-02-09 NOTE — Telephone Encounter (Signed)
Patient informed we are still not allowing any visitors or children to come in during appointment time unless physical assistance is needed. Asked if has had any exposure to anyone suspected or confirmed of having COVID-19 or if she was experiencing any of the following, to reschedule: fever, cough, shortness of breath, muscle pain, diarrhea, rash, vomiting, abdominal pain, red eye, weakness, bruising, bleeding, joint pain, or a severe headache.  Stated no to all.  Asked that she complete E-check-in via mychart prior to arrival.  Advised to check-in via [facility] and call our office on arrival in our office parking lot to complete registration over the phone. Advised to also use the provided hand sanitizer when entering the office and to wear a mask if she has one, if not, we will provide one. Pt verbalized understanding.     

## 2019-02-13 ENCOUNTER — Ambulatory Visit (INDEPENDENT_AMBULATORY_CARE_PROVIDER_SITE_OTHER): Payer: Medicaid Other | Admitting: Adult Health

## 2019-02-13 ENCOUNTER — Other Ambulatory Visit: Payer: Self-pay

## 2019-02-13 ENCOUNTER — Encounter: Payer: Self-pay | Admitting: Adult Health

## 2019-02-13 ENCOUNTER — Other Ambulatory Visit (HOSPITAL_COMMUNITY)
Admission: RE | Admit: 2019-02-13 | Discharge: 2019-02-13 | Disposition: A | Discharge status: Home / Self Care | Payer: Medicaid Other | Source: Ambulatory Visit | Attending: Obstetrics & Gynecology | Admitting: Obstetrics & Gynecology

## 2019-02-13 VITALS — BP 143/82 | HR 78 | Ht 61.5 in | Wt 205.5 lb

## 2019-02-13 DIAGNOSIS — Z1212 Encounter for screening for malignant neoplasm of rectum: Secondary | ICD-10-CM | POA: Diagnosis not present

## 2019-02-13 DIAGNOSIS — Z0000 Encounter for general adult medical examination without abnormal findings: Secondary | ICD-10-CM | POA: Diagnosis not present

## 2019-02-13 DIAGNOSIS — Z1211 Encounter for screening for malignant neoplasm of colon: Secondary | ICD-10-CM | POA: Diagnosis not present

## 2019-02-13 DIAGNOSIS — Z124 Encounter for screening for malignant neoplasm of cervix: Secondary | ICD-10-CM | POA: Insufficient documentation

## 2019-02-13 DIAGNOSIS — Z01419 Encounter for gynecological examination (general) (routine) without abnormal findings: Secondary | ICD-10-CM | POA: Diagnosis not present

## 2019-02-13 DIAGNOSIS — N814 Uterovaginal prolapse, unspecified: Secondary | ICD-10-CM | POA: Insufficient documentation

## 2019-02-13 LAB — HEMOCCULT GUIAC POC 1CARD (OFFICE): Fecal Occult Blood, POC: NEGATIVE

## 2019-02-13 NOTE — Progress Notes (Signed)
Patient ID: Maria Murphy, female   DOB: April 14, 1956, 63 y.o.   MRN: [id number] History of Present Illness: Orabelle is a 63 year old black female, widowed, PM, in for a well woman gyn exam and pap. She had pessary and had it removed, did not like it.  PCP is Dr Legrand Rams.    Current Medications, Allergies, Past Medical History, Past Surgical History, Family History and Social History were reviewed in [employer] record.     Review of Systems:  Patient denies any headaches, hearing loss, fatigue, blurred vision, shortness of breath, chest pain, abdominal pain, problems with bowel movements, urination, or intercourse(not having sex). No joint pain or mood swings.   Physical Exam:BP (!) 143/82 (BP Location: Left Arm, Patient Position: Sitting, Cuff Size: Large)   Pulse 78   Ht 5' 1.5" (1.562 m)   Wt 205 lb 8 oz (93.2 kg)   BMI 38.20 kg/m  General:  Well developed, well nourished, no acute distress Skin:  Warm and dry Neck:  Midline trachea, normal thyroid, good ROM, no lymphadenopathy,no carotid bruits heard Lungs; Clear to auscultation bilaterally Breast:  No dominant palpable mass, retraction, or nipple discharge Cardiovascular: Regular rate and rhythm Abdomen:  Soft, non tender, no hepatosplenomegaly Pelvic:  External genitalia is normal in appearance, no lesions.  The vagina is pale with loss of moisture and rugae, has cystocele. Urethra has no lesions or masses. The cervix is smooth, pap with HPV performed, +descensus.  Uterus is felt to be normal size, shape, and contour.  No adnexal masses or tenderness noted.Bladder is non tender, no masses felt. Rectal: Good sphincter tone, no polyps, or hemorrhoids felt.  Hemoccult negative. Extremities/musculoskeletal:  No swelling or varicosities noted, no clubbing or cyanosis, has scratches both lower legs has cat and she climbs her pants legs. Psych:  No mood changes, alert and cooperative,seems happy Fall risk is low. PHQ 9  score is 10, is on celexa and denies being suicidal. Examination chaperoned by Levy Pupa  She said she had negative cologuard.    Impression:  1. Encounter for gynecological examination with Papanicolaou smear of cervix   2. Routine cervical smear   3. Screening for colorectal cancer   4. Cystocele with uterine descensus      Plan: Number given to get mammogram  Labs with PCP Physical in 1 year Pap in 3 if normal She is not ready to do anything about cystocele

## 2019-02-15 LAB — CYTOLOGY - PAP
Chlamydia: NEGATIVE
Diagnosis: NEGATIVE
HPV: NOT DETECTED
Neisseria Gonorrhea: NEGATIVE

## 2019-08-16 ENCOUNTER — Other Ambulatory Visit: Payer: Self-pay

## 2019-08-16 ENCOUNTER — Ambulatory Visit
Admission: EM | Admit: 2019-08-16 | Discharge: 2019-08-16 | Disposition: A | Discharge status: Home / Self Care | Payer: Medicaid Other | Attending: Urgent Care | Admitting: Urgent Care

## 2019-08-16 DIAGNOSIS — H9313 Tinnitus, bilateral: Secondary | ICD-10-CM

## 2019-08-16 DIAGNOSIS — F419 Anxiety disorder, unspecified: Secondary | ICD-10-CM

## 2019-08-16 MED ORDER — CITALOPRAM HYDROBROMIDE 20 MG PO TABS: 20.0000 mg | ORAL_TABLET | Freq: Every day | ORAL | 0 refills | Status: DC

## 2019-08-16 NOTE — ED Provider Notes (Signed)
[place]     MRN: [id number] DOB: 1955/11/01  Subjective:   Maria Murphy is a 63 y.o. female presenting for several day history of recurrent bilateral tinnitus, right worse than left.  Patient states that this is very consistent with her having run out of her Celexa.  Takes Celexa for her anxiety which is pretty severe, has a lot of stressors.  Unfortunately she has not been able to get in with her PCP lately and ran out of her Celexa about 3 weeks ago.  Denies fever, sinus pain, runny stuffy nose, sore throat, cough, ear pain, ear drainage.  No current facility-administered medications for this encounter.  Current Outpatient Medications:  .  acetaminophen (TYLENOL) 500 MG tablet, Take 1 tablet (500 mg total) by mouth every 6 (six) hours as needed., Disp: 30 tablet, Rfl: 0 .  citalopram (CELEXA) 20 MG tablet, Take 20 mg by mouth daily., Disp: , Rfl:  .  Ginger, Zingiber officinalis, (GINGER PO), Take 1 tablet by mouth. , Disp: , Rfl:  .  ibuprofen (ADVIL,MOTRIN) 400 MG tablet, Take 1 tablet (400 mg total) by mouth every 6 (six) hours as needed for moderate pain., Disp: 30 tablet, Rfl: 0 .  lisinopril-hydrochlorothiazide (PRINZIDE,ZESTORETIC) 10-12.5 MG tablet, Take 1 tablet by mouth daily., Disp: , Rfl:  .  oxyCODONE-acetaminophen (PERCOCET/ROXICET) 5-325 MG tablet, Take 1 tablet by mouth every 4 (four) hours as needed., Disp: 20 tablet, Rfl: 0 .  traZODone (DESYREL) 50 MG tablet, Take 50 mg by mouth at bedtime., Disp: , Rfl:     Allergies  Allergen Reactions  . Wheat Extract Swelling    Past Medical History:  Diagnosis Date  . Essential hypertension, benign 01/24/2015  . Hypertension   . Memory loss   . Vision abnormalities      Past Surgical History:  Procedure Laterality Date  . TONSILLECTOMY      Family History  Problem Relation Age of Onset  . High Cholesterol Mother   . Hypertension Mother   . Arthritis/Rheumatoid Mother   . Alcohol abuse  Father     Social History   Tobacco Use  . Smoking status: Never Smoker  . Smokeless tobacco: Never Used  Substance Use Topics  . Alcohol use: No    Alcohol/week: 1.0 standard drinks    Types: 1 Glasses of wine per week  . Drug use: No    ROS   Objective:   Vitals: BP 130/81 (BP Location: Right Arm)   Pulse 74   Resp 16   SpO2 96%   Physical Exam Constitutional:      General: She is not in acute distress.    Appearance: Normal appearance. She is well-developed. She is not ill-appearing, toxic-appearing or diaphoretic.  HENT:     Head: Normocephalic and atraumatic.     Right Ear: Tympanic membrane, ear canal and external ear normal. There is no impacted cerumen.     Left Ear: Tympanic membrane, ear canal and external ear normal. There is no impacted cerumen.     Nose: Nose normal.     Mouth/Throat:     Mouth: Mucous membranes are moist.  Eyes:     Extraocular Movements: Extraocular movements intact.     Pupils: Pupils are equal, round, and reactive to light.  Cardiovascular:     Rate and Rhythm: Normal rate and regular rhythm.     Pulses: Normal pulses.     Heart sounds: Normal heart sounds. No murmur. No friction rub. No gallop.  Pulmonary:     Effort: Pulmonary effort is normal. No respiratory distress.     Breath sounds: Normal breath sounds. No stridor. No wheezing, rhonchi or rales.  Skin:    General: Skin is warm and dry.     Findings: No rash.  Neurological:     Mental Status: She is alert and oriented to person, place, and time.  Psychiatric:        Mood and Affect: Mood normal.        Behavior: Behavior normal.        Thought Content: Thought content normal.        Judgment: Judgment normal.      Assessment and Plan :   1. Tinnitus of both ears   2. Anxiety     ENT exam is very reassuring, vital signs are stable.  Counseled patient that I will refill her Celexa at 20 mg once daily as opposed to her usual 40mg  as she has been off of this for  few weeks now.  Emphasized need to follow-up with her new PCP to obtain referrals and do dosing titration for her.  Also recommended she use an oral antihistamine or nasal spray to help with any possible ETD manifesting as tinnitus. Counseled patient on potential for adverse effects with medications prescribed/recommended today, ER and return-to-clinic precautions discussed, patient verbalized understanding.    Jaynee Eagles, [place] 08/16/19 1644

## 2019-08-16 NOTE — ED Triage Notes (Signed)
Pt presents to UC w/ c/o ringing in ears x3 weeks. Pt states she starts having ringing in her ears after she stops or is out of her celexa.

## 2019-08-22 ENCOUNTER — Other Ambulatory Visit (HOSPITAL_COMMUNITY): Payer: Self-pay | Admitting: Internal Medicine

## 2019-08-22 DIAGNOSIS — Z1231 Encounter for screening mammogram for malignant neoplasm of breast: Secondary | ICD-10-CM

## 2019-08-30 ENCOUNTER — Ambulatory Visit (HOSPITAL_COMMUNITY)
Admission: RE | Admit: 2019-08-30 | Discharge: 2019-08-30 | Disposition: A | Discharge status: Home / Self Care | Payer: Medicaid Other | Source: Ambulatory Visit | Attending: Internal Medicine | Admitting: Internal Medicine

## 2019-08-30 ENCOUNTER — Other Ambulatory Visit: Payer: Self-pay

## 2019-08-30 DIAGNOSIS — Z1231 Encounter for screening mammogram for malignant neoplasm of breast: Secondary | ICD-10-CM | POA: Diagnosis present

## 2019-09-04 ENCOUNTER — Other Ambulatory Visit (HOSPITAL_COMMUNITY): Payer: Self-pay | Admitting: Internal Medicine

## 2019-09-04 DIAGNOSIS — R928 Other abnormal and inconclusive findings on diagnostic imaging of breast: Secondary | ICD-10-CM

## 2019-09-12 ENCOUNTER — Ambulatory Visit (HOSPITAL_COMMUNITY)
Admission: RE | Admit: 2019-09-12 | Discharge: 2019-09-12 | Disposition: A | Discharge status: Home / Self Care | Payer: Medicaid Other | Source: Ambulatory Visit | Attending: Internal Medicine | Admitting: Internal Medicine

## 2019-09-12 ENCOUNTER — Ambulatory Visit (HOSPITAL_COMMUNITY): Admission: RE | Admit: 2019-09-12 | Payer: Medicaid Other | Source: Ambulatory Visit

## 2019-09-12 ENCOUNTER — Other Ambulatory Visit: Payer: Self-pay

## 2019-09-12 DIAGNOSIS — R928 Other abnormal and inconclusive findings on diagnostic imaging of breast: Secondary | ICD-10-CM | POA: Insufficient documentation

## 2019-10-02 ENCOUNTER — Telehealth: Payer: Self-pay | Admitting: Orthopedic Surgery

## 2019-10-02 NOTE — Telephone Encounter (Signed)
Do whatever we need to do to get the clearance

## 2019-10-02 NOTE — Telephone Encounter (Signed)
Called patient; relayed. Printed and re-faxed the letter to Dr Josephine Cables office [phone]; also called office and left a message.

## 2019-10-02 NOTE — Telephone Encounter (Signed)
I sent the letter  They have trouble receiving these some times  Do you have their fax number?   I can print/ fax  She needs appointment to discuss surgery AFTER clearance.

## 2019-10-02 NOTE — Telephone Encounter (Signed)
Patient called to relay that her primary care provider, Dr Legrand Rams, said that they are awaiting a clearance form to be filled out for surgery. Said they have not ever received a letter. Aware that covid-19 rules began shortly after her last visit. Due to last visit being January of 2020, I relayed to patient that she may need to schedule another visit - patient aware we will forward the message to clinical staff. Please advise.

## 2019-10-02 NOTE — Telephone Encounter (Signed)
Sending to Dr Aline Brochure  We last saw her in April 2020   She was told to see her primary care physician for clearance  Do you want to send letter ?  It was in the office visit notes

## 2019-11-07 ENCOUNTER — Telehealth: Payer: Self-pay | Admitting: Radiology

## 2019-11-07 NOTE — Telephone Encounter (Addendum)
Dr Legrand Rams has sent fax, patient is low risk for orthopedic surgery  A1c 6.0 BMI is 37  To Dr Bill Salinas  She needs appointment to discuss surgery / has been almost a year since she was here.

## 2019-11-07 NOTE — Telephone Encounter (Signed)
Called patient; scheduled appointment; aware.

## 2019-11-07 NOTE — Telephone Encounter (Signed)
Lets get her in

## 2019-11-10 ENCOUNTER — Ambulatory Visit: Payer: Medicaid Other | Attending: Internal Medicine

## 2019-11-10 DIAGNOSIS — Z23 Encounter for immunization: Secondary | ICD-10-CM

## 2019-11-10 NOTE — Progress Notes (Signed)
   Covid-19 Vaccination Clinic  Name:  [name]    MRN: [id number] DOB: [birth year]  11/10/2019  [name] was observed post Covid-19 immunization for 15 minutes without incident. She was provided with Vaccine Information Sheet and instruction to access the V-Safe system.   [name] was instructed to call 911 with any severe reactions post vaccine: Marland Kitchen Difficulty breathing  . Swelling of face and throat  . A fast heartbeat  . A bad rash all over body  . Dizziness and weakness   Immunizations Administered    Name Date Dose VIS Date Route   Moderna COVID-19 Vaccine 11/10/2019 11:34 AM 0.5 mL 07/25/2019 Intramuscular   Manufacturer: Moderna   Lot: [id number]   OctaVO:[id number]

## 2019-11-21 ENCOUNTER — Ambulatory Visit (INDEPENDENT_AMBULATORY_CARE_PROVIDER_SITE_OTHER): Payer: Medicaid Other | Admitting: Orthopedic Surgery

## 2019-11-21 ENCOUNTER — Encounter: Payer: Self-pay | Admitting: Orthopedic Surgery

## 2019-11-21 ENCOUNTER — Other Ambulatory Visit: Payer: Self-pay

## 2019-11-21 ENCOUNTER — Ambulatory Visit: Payer: Medicaid Other

## 2019-11-21 VITALS — BP 111/70 | HR 67 | Ht 64.0 in | Wt 210.0 lb

## 2019-11-21 DIAGNOSIS — M170 Bilateral primary osteoarthritis of knee: Secondary | ICD-10-CM | POA: Diagnosis not present

## 2019-11-21 DIAGNOSIS — G8929 Other chronic pain: Secondary | ICD-10-CM | POA: Diagnosis not present

## 2019-11-21 DIAGNOSIS — M25561 Pain in right knee: Secondary | ICD-10-CM

## 2019-11-21 DIAGNOSIS — M25562 Pain in left knee: Secondary | ICD-10-CM

## 2019-11-21 DIAGNOSIS — M1710 Unilateral primary osteoarthritis, unspecified knee: Secondary | ICD-10-CM

## 2019-11-21 NOTE — Progress Notes (Signed)
Maria Murphy  11/21/2019  Body mass index is 36.05 kg/m.   PREOP    HISTORY SECTION :  Chief Complaint  Patient presents with  . Knee Pain    right knee is worse/ both knees hurt    64 years old cleared by Dr. Legrand Rams her note is in the media section of the chart she has severe right knee pain with a severe stiffness requiring her to use a cane she has trouble bending her knee climbing squatting kneeling pain has not been relieved by usual nonoperative measures she presents for right total knee replacement    Review of Systems  All other systems reviewed and are negative.    has a past medical history of Essential hypertension, benign (01/24/2015), Hypertension, Memory loss, and Vision abnormalities.   Past Surgical History:  Procedure Laterality Date  . TONSILLECTOMY      Body mass index is 36.05 kg/m.   Allergies  Allergen Reactions  . Wheat Extract Swelling     Current Outpatient Medications:  .  acetaminophen (TYLENOL) 500 MG tablet, Take 1 tablet (500 mg total) by mouth every 6 (six) hours as needed., Disp: 30 tablet, Rfl: 0 .  citalopram (CELEXA) 20 MG tablet, Take 1 tablet (20 mg total) by mouth daily., Disp: 30 tablet, Rfl: 0 .  gabapentin (NEURONTIN) 300 MG capsule, TAKE 1 CAPSULE BY MOUTH TWICE DAILY, Disp: , Rfl:  .  Ginger, Zingiber officinalis, (GINGER PO), Take 1 tablet by mouth. , Disp: , Rfl:  .  ibuprofen (ADVIL,MOTRIN) 400 MG tablet, Take 1 tablet (400 mg total) by mouth every 6 (six) hours as needed for moderate pain., Disp: 30 tablet, Rfl: 0 .  lisinopril-hydrochlorothiazide (PRINZIDE,ZESTORETIC) 10-12.5 MG tablet, Take 1 tablet by mouth daily., Disp: , Rfl:  .  traZODone (DESYREL) 100 MG tablet, TAKE 1 TABLET BY MOUTH ONCE DAILY AFTER A MEAL, Disp: , Rfl:    PHYSICAL EXAM SECTION: 1) BP 111/70   Pulse 67   Ht 5\' 4"  (1.626 m)   Wt 210 lb (95.3 kg)   BMI 36.05 kg/m   Body mass index is 36.05 kg/m. General appearance: Well-developed  well-nourished no gross deformities  2) Cardiovascular normal pulse and perfusion , normal color   3) Neurologically deep tendon reflexes are equal and normal, no sensation loss or deficits no pathologic reflexes  4) Psychological: Awake alert and oriented x3 mood and affect normal  5) Skin no lacerations or ulcerations no nodularity no palpable masses, no erythema or nodularity  6) Musculoskeletal:   Right knee only been 70 degrees and actually comes to full extension.  She does use a cane to walk and she is limping.  She stable in the sagittal and coronal plane of all degrees of flexion she has good muscle tone and strength she has some mild distal edema  Upper extremities are normal  Left knee only bends to 90 degrees also come straight seems to be stable to stress testing neurovascular exam is intact bilaterally     MEDICAL DECISION MAKING  Images were taken of both knees today she has severe arthritis right greater than left  Right knee shows a multitude of osteophytes in the patella near the quadriceps tendon posteriorly tibia and femur  Similar findings on the left knee varus deformity is not as bad posterior osteophytes are nearly as bad and there are also multiple osteophytes around the patella    A.  Encounter Diagnoses  Name Primary?  . Chronic pain of both  knees Yes  . Primary localized osteoarthritis of knee    Plan for right total knee replacement  She has 3 steps to get in the house everything is on the first floor.  She has a friend that will help her for week  We discussed the risk of surgery including bleeding infection blood clot and stiffness.  She is an extensive risk for stiffness at the preop flexion of only 70 degrees.  She also has diabetes which makes her at increased risk of infection this was discussed  We discussed across the 6-week plan for opioid pain management and tapering  The procedure has been fully reviewed with the patient; The risks  and benefits of surgery have been discussed and explained and understood. Alternative treatment has also been reviewed, questions were encouraged and answered. The postoperative plan is also been reviewed.  No orders of the defined types were placed in this encounter.     Arther Abbott, MD  11/21/2019 2:40 PM

## 2019-11-21 NOTE — Patient Instructions (Signed)
Preparing for Knee Replacement °Getting prepared before knee replacement surgery can make your recovery easier and more comfortable. This document provides some tips and guidelines that will help you prepare for your surgery. °Talk with your health care provider so you can learn what to expect before, during, and after surgery. Ask questions if you do not understand something. °To ease concerns about your financial responsibilities, call your insurance company as soon as you decide to have surgery. Ask how much of your surgery and hospital stay will be covered. Also ask about coverage for medical equipment, rehabilitation facilities, and home care. °How should I arrange for help? °In the first couple weeks after surgery, it will likely be harder for you to do some of your regular activities. You may get tired easily, and you will have limited movement in your leg. Follow these guidelines to make sure you have all the help you need after your surgery: °· Plan to have someone take you home from the hospital. Your health care provider will tell you how many days you can expect to be in the hospital. °· Cancel all your work, caregiving, and volunteer responsibilities for at least 4-6 weeks after your surgery. °· Plan to have someone stay with you day and night for the first week. This person should be someone you are comfortable with. You may need this person to help you with your exercises and personal care, such as bathing and using the toilet. °· If you live alone, arrange for someone to take care of your home and pets for the first 4-6 weeks after surgery. °· Arrange for drivers to take you to and from follow-up appointments, the grocery store, and other places you may need to go for at least 4-6 weeks. °· Consider applying for a disabled parking permit. To get an application, contact the Department of Motor Vehicles or your health care provider's office. °How should I prepare my home? ° °  ° °· Pick a recovery  spot, but do not plan on recovering in bed. Sitting upright is better for your health. You may want to use a recliner with a small table nearby. Place the items you use most frequently on that table. These may include the TV remote, a cordless phone, a cell phone, a book or laptop computer, and a water glass. °· To see if you will be able to move around in your home with a walker, hold your hands out about 6 inches (15 cm) from your sides, and walk from your recovery spot to your kitchen and bathroom. Then walk from your bed to the bathroom. If you do not hit anything with your hands, you will have enough room for a walker. °· Minimize the use of stairs after you return home to reduce your risk of falling or tripping. °· Remove all clutter from your floors. Also remove any throw rugs. This will help you avoid tripping after your surgery. °· Move the items you use most often in your kitchen, bathroom, and bedroom to shelves and drawers that are at countertop height. °· Prepare a few meals to freeze and reheat later. °· Consider getting safety equipment that will be helpful during your recovery, such as: °? Grab bars added in the shower and near the toilet. °? A raised toilet seat to help you get on and off the toilet more easily. °? A tub or shower bench. °How should I prepare my body? °· Have a preoperative exam. °? During the exam, your health   care provider will make sure that your body is healthy enough to safely have the surgery. ? When you go to the exam, bring a complete list of all your medicines and supplements, including herbs and vitamins. ? You may need to have additional tests to ensure your safety.  Have elective dental care and routine cleanings done before your surgery. Germs from anywhere in your body, including your mouth, can travel to your new joint and infect it. It is important that you do not have any dental work done for at least 3 months after your surgery.  Maintain a healthy diet. Do  not change your diet before surgery unless your health care provider tells you to do that.  Do not use any products that contain nicotine or tobacco, such as cigarettes and e-cigarettes. These can delay bone healing after surgery. If you need help quitting, ask your health care provider.  Tell your health care provider if: ? You develop any skin infections or skin irritations. You may need to improve the condition of your skin before surgery. ? You have a fever, a cold, or any other illness in the week before your surgery.  Do not drink any alcohol for at least 48 hours before surgery.  The day before your surgery, follow instructions from your health care provider about showering, eating, drinking, and taking medicines. These directions are for your safety.  Talk to your health care provider about doing exercises before your surgery. ? Be sure to follow the exercise program only as directed by your health care provider. ? Doing these exercises in the weeks before your surgery may help reduce pain and improve function after surgery. Summary  Getting prepared before knee replacement surgery can make your recovery easier and more comfortable.  Prepare your home and arrange for help at home.  Keep all of your preoperative appointments to ensure that you are ready for your surgery.  Plan to have someone take you home from the hospital and stay with you day and night for the first week. This information is not intended to replace advice given to you by your health care provider. Make sure you discuss any questions you have with your health care provider. Document Revised: 09/27/2017 Document Reviewed: 09/27/2017 Elsevier Patient Education  [place].  Total Knee Replacement  Total knee replacement is a surgery to replace a bad knee joint with a man-made (prosthetic) joint. The man-made joint is called a prosthesis. This joint may be made of plastic, metal, or ceramic parts. It  replaces parts of the thigh bone (femur), lower leg bone (tibia), and kneecap (patella). This is done to reduce pain and help you move better. Tell your doctor about:  Any allergies you have.  All medicines you are taking. This includes vitamins, herbs, eye drops, creams, and over-the-counter medicines.  Any problems you or family members have had with anesthetic medicines.  Any blood disorders you have.  Any surgeries you have had.  Any medical conditions you have.  Whether you are pregnant or may be pregnant. What are the risks? Generally, this is a safe procedure. However, problems may occur, such as:  Infection.  Bleeding.  Blood clot.  Allergic reaction to medicines.  Damage to nerves and other parts of the knee.  Not being able to move your knee as much.  A feeling that the knee is weak or unstable.  Loosening of the new joint.  Pain that does not go away (chronic pain). What happens before  the procedure? Staying hydrated Follow instructions from your doctor about hydration. These may include:  Up to 2 hours before the procedure - you may continue to drink clear liquids. These include water, clear fruit juice, black coffee, and plain tea.  Eating and drinking Follow instructions from your doctor about eating and drinking. These may include:  8 hours before the procedure - stop eating heavy meals or foods. These include meat, fried foods, or fatty foods.  6 hours before the procedure - stop eating light meals or foods. These include toast or cereal.  6 hours before the procedure - stop drinking milk or drinks that contain milk.  2 hours before the procedure - stop drinking clear liquids. Medicines Ask your doctor about:  Changing or stopping your normal medicines. This is important.  Taking aspirin and ibuprofen. Do not take these medicines unless your doctor tells you to take them.  Taking over-the-counter medicines, vitamins, herbs, and  supplements. Tests and exams  You may have a physical exam.  You may have tests, such as: ? X-rays. ? MRI. ? CT scan. ? Bone scans.  You may have a blood or urine sample taken. Lifestyle   If your doctor tells you to do physical therapy, do exercises as told.  Keep your body and teeth clean. Germs from anywhere in your body can infect your new joint. Tell your doctor if: ? You plan to have dental care and routine cleanings. ? You get any skin infections.  If you are overweight, work with your doctor to reach a safe weight. Extra weight can affect your knee.  Do not use any products that contain nicotine or tobacco, such as cigarettes, e-cigarettes, and chewing tobacco. These can delay healing. If you need help quitting, ask your doctor. General instructions  Plan to have someone take you home from the hospital or clinic.  If you will be going home right after the procedure, plan to have someone with you for at least 24 hours. It is best to have someone to help care for you for at least 4-6 weeks after surgery.  Do not shave your legs just before surgery. This will be done in the hospital if needed.  Ask your doctor: ? How your surgery site will be marked. ? What steps will be taken to help prevent the spread of germs. These may include:  Removing hair at the surgery site.  Washing skin with a germ-killing soap. What happens during the procedure?  An IV tube will be put into one of your veins.  You will be given one or more of the following: ? A medicine to help you relax (sedative). ? A medicine to numb everything below the injection site (peripheral nerve block). ? A medicine that numbs your body below the waist (spinal anesthetic). ? A medicine to make you fall asleep (general anesthetic).  A cut (incision) will be made in your knee.  Damaged parts of your thigh bone, lower leg bone, and kneecap will be removed.  Parts of the new joint will be placed on your  thigh bone, your lower leg bone, and the underside of your kneecap.  One or more small tubes (drains) may be placed near your cut. This will help to drain fluid.  Your cut will be closed. Your doctor will use stitches (sutures), skin glue, or skin tape (adhesive) strips.  A bandage (dressing) will be placed over your cut. The procedure may vary among doctors and hospitals. What happens after the  procedure?  You will be monitored until you leave the hospital or clinic. Your blood pressure, heart rate, breathing rate, and blood oxygen level will be checked.  You will be given medicines for pain.  You may: ? Keep getting fluids through an IV tube. ? Have fluid coming from the drain. ? Have to wear special socks (compression stockings). ? Be given a knee joint motion machine (continuous passive motion machine) to use at home.  You will be told to move around as much as you can.  Do not drive until your health care provider tells you it is okay. Summary  Total knee replacement is a surgery to replace a bad knee joint with a man-made joint.  Before the procedure, follow what you are told about eating, drinking, and taking medicines.  Plan to have someone take you home from the hospital or clinic. This information is not intended to replace advice given to you by your health care provider. Make sure you discuss any questions you have with your health care provider. Document Revised: 03/24/2018 Document Reviewed: 03/24/2018 Elsevier Patient Education  [place].  Total Knee Replacement, Care After This sheet gives you information about how to care for yourself after your procedure. Your doctor may also give you more specific instructions. If you have problems or questions, contact your doctor. What can I expect after the procedure? After the procedure, it is common to have:  Pain.  Swelling.  A small amount of blood coming from your cut from surgery (incision).  Clear  fluid coming from your cut from surgery.  Limited movement of your knee. Follow these instructions at home: Medicines  Take over-the-counter and prescription medicines only as told by your doctor.  If you were prescribed a blood thinner (anticoagulant), take it as told by your doctor.  Ask your doctor if the medicine prescribed to you: ? Requires you to avoid driving or using heavy machinery. ? Can cause trouble pooping (constipation). You may need to take steps to prevent or treat trouble pooping:  Drink enough fluid to keep your pee (urine) pale yellow.  Take over-the-counter or prescription medicines.  Eat foods that are high in fiber. These include beans, whole grains, and fresh fruits and vegetables.  Limit foods that are high in fat and sugar. These include fried or sweet foods. Bathing  Do not take baths, swim, or use a hot tub until your doctor approves. Ask your doctor if you may take showers. You may only be allowed to take sponge baths.  Keep your bandage (dressing) dry until your doctor says it can be taken off. Incision care and drain care   Follow instructions from your doctor about how to take care of your cut from surgery. Make sure you: ? Wash your hands with soap and water before and after you change your bandage. If you cannot use soap and water, use hand sanitizer. ? Change your bandage as told by your doctor. ? Leave stitches (sutures), skin glue, or skin tape (adhesive) strips in place. They may need to stay in place for 2 weeks or longer. If tape strips get loose and curl up, you may trim the loose edges. Do not remove tape strips completely unless your doctor says it is okay.  Check your cut from surgery and your drain site every day for signs of infection. Check for: ? More redness, swelling, or pain. ? More fluid or blood. ? Warmth. ? Pus or a bad smell.  If you  have a drain, follow instructions from your doctor about caring for it. Managing pain,  stiffness, and swelling      If told, put ice on your knee. ? Put ice in a plastic bag or use the icing device (cold flow pad or cryocuff) that you were given. Follow your doctor's directions about how to use the icing device. ? Place a towel between your skin and the bag or between your skin and the icing device. ? Leave the ice on for 20 minutes, 2-3 times per day.  If told, put heat on your knee before you exercise. Use the heat source that your doctor recommends, such as a moist heat pack or a heating pad. ? Place a towel between your skin and the heat source. ? Leave the heat on for 20-30 minutes. ? Remove the heat if your skin turns bright red. This is very important if you are unable to feel pain, heat, or cold. You may have a greater risk of getting burned.  Move your toes often.  Raise (elevate) your knee above the level of your heart while you are sitting or lying down. ? Use several pillows to keep your leg straight. ? Do not put a pillow just under the knee. If the knee is bent for a long time, this may make the knee stiff.  Wear elastic knee support as told by your doctor. Activity  Rest as told by your doctor.  Do not sit for a long time without moving. Get up to take short walks every 1-2 hours. This is important. Ask for help if you feel weak or unsteady.  Ask your doctor what activities are safe for you.  Avoid activities that put stress on your knees. These include running, jumping rope, and jumping jacks.  Do not play contact sports until your doctor says it is okay.  Do exercises as told by your physical therapist.  If you have been sent home with a knee joint motion machine (continuous passive motion machine), use it as told by your doctor. Safety   Do not use your leg to support your body weight until your doctor says that you can. Use crutches or a walker as told by your doctor.  Do not drive until your doctor says it is okay. Ask your doctor when it  is safe to drive. General instructions  Do not use any products that contain nicotine or tobacco, such as cigarettes, e-cigarettes, and chewing tobacco. These can delay healing. If you need help quitting, ask your doctor.  Wear special socks (compression stockings) as told by your doctor.  Tell your doctor if you plan to have dental work. Also: ? Tell your dentist about your joint replacement. ? Ask your doctor if there are instructions you need to follow before dental care and routine cleanings.  Keep all follow-up visits as told by your doctor. This is important. Contact a doctor if:  You have more redness, swelling, or pain around your cut from surgery or your drain.  You have more fluid or blood coming from your cut from surgery or your drain.  You have pus or a bad smell coming from your cut from surgery or your drain.  Your cut from surgery or your drain area feels warm to the touch.  You have a fever.  Your cut breaks open.  You have knee pain that does not go away.  The movement of your knee is getting worse.  Your new joint feels loose. Get  help right away if you have:  Pain in your calf or thigh.  Swelling in your calf or thigh.  Shortness of breath.  Trouble breathing.  Chest pain. Summary  After the procedure, it is common to have pain and swelling, blood or fluid coming from your cut from surgery, and trouble moving your knee.  Follow instructions from your doctor about how to take care of your cut from surgery.  Use crutches or a walker as told by your doctor.  If you were prescribed a blood thinner, take it as told by your doctor.  Keep all follow-up visits as told by your doctor. This is important. This information is not intended to replace advice given to you by your health care provider. Make sure you discuss any questions you have with your health care provider. Document Revised: 12/19/2018 Document Reviewed: 03/24/2018 Elsevier Patient  Education  [place].

## 2019-11-22 ENCOUNTER — Telehealth: Payer: Self-pay | Admitting: Radiology

## 2019-11-22 NOTE — Telephone Encounter (Signed)
Ok to [place] to June, left message for her to call me back to discuss date.

## 2019-11-22 NOTE — Telephone Encounter (Signed)
RS June 8th

## 2019-11-22 NOTE — Telephone Encounter (Signed)
Patient called, wants to reschedule her surgery til sometime in June maybe, when her disability will be active.  Please call her to discuss.

## 2019-12-05 ENCOUNTER — Telehealth: Payer: Self-pay | Admitting: Radiology

## 2019-12-05 NOTE — Telephone Encounter (Signed)
-----   Message from Josue Hector sent at 12/04/2019 10:17 AM EDT ----- Maria Murphy,  I just spoke with this patient and she states that she wants to lose weight before doing this surgery.  She stated that she is going to call to cancel.  I am going to drop her down into the dep[place].  Will you just let me know when you talk to her so that I can either cancel or if she is going to reschedule it further out.  Thanks,  Hoyle Sauer

## 2019-12-05 NOTE — Telephone Encounter (Signed)
I have asked Hoyle Sauer to cancel  Patient not ready yet, she will let me know when she is ready. She has read online she needs to lose weight first  To you FYI

## 2019-12-13 ENCOUNTER — Ambulatory Visit: Payer: Medicaid Other | Attending: Internal Medicine

## 2019-12-13 DIAGNOSIS — Z23 Encounter for immunization: Secondary | ICD-10-CM

## 2019-12-13 NOTE — Progress Notes (Signed)
   Covid-19 Vaccination Clinic  Name:  Maria Murphy    MRN: [id number] DOB: 06-04-1956  12/13/2019  [name] was observed post Covid-19 immunization for 15 minutes without incident. She was provided with Vaccine Information Sheet and instruction to access the V-Safe system.   [name] was instructed to call 911 with any severe reactions post vaccine: Marland Kitchen Difficulty breathing  . Swelling of face and throat  . A fast heartbeat  . A bad rash all over body  . Dizziness and weakness   Immunizations Administered    Name Date Dose VIS Date Route   Moderna COVID-19 Vaccine 12/13/2019  2:42 PM 0.5 mL 07/2019 Intramuscular   Manufacturer: Moderna   Lot: [id number]   [id number]

## 2020-01-30 ENCOUNTER — Ambulatory Visit: Admit: 2020-01-30 | Payer: Medicaid Other | Admitting: Orthopedic Surgery

## 2020-01-30 SURGERY — ARTHROPLASTY, KNEE, TOTAL
Anesthesia: Choice | Site: Knee | Laterality: Right

## 2020-02-12 ENCOUNTER — Other Ambulatory Visit (HOSPITAL_COMMUNITY): Payer: Self-pay | Admitting: Internal Medicine

## 2020-02-12 DIAGNOSIS — R928 Other abnormal and inconclusive findings on diagnostic imaging of breast: Secondary | ICD-10-CM

## 2020-02-12 DIAGNOSIS — R921 Mammographic calcification found on diagnostic imaging of breast: Secondary | ICD-10-CM

## 2020-02-14 ENCOUNTER — Ambulatory Visit: Payer: Medicaid Other | Admitting: Orthopedic Surgery

## 2020-02-15 ENCOUNTER — Other Ambulatory Visit: Payer: Self-pay

## 2020-02-15 ENCOUNTER — Encounter (HOSPITAL_BASED_OUTPATIENT_CLINIC_OR_DEPARTMENT_OTHER): Payer: Medicaid Other | Attending: Internal Medicine | Admitting: Internal Medicine

## 2020-02-15 DIAGNOSIS — I890 Lymphedema, not elsewhere classified: Secondary | ICD-10-CM | POA: Diagnosis not present

## 2020-02-15 DIAGNOSIS — Z7984 Long term (current) use of oral hypoglycemic drugs: Secondary | ICD-10-CM | POA: Insufficient documentation

## 2020-02-15 DIAGNOSIS — I10 Essential (primary) hypertension: Secondary | ICD-10-CM | POA: Diagnosis not present

## 2020-02-15 DIAGNOSIS — L97222 Non-pressure chronic ulcer of left calf with fat layer exposed: Secondary | ICD-10-CM | POA: Diagnosis not present

## 2020-02-15 DIAGNOSIS — I87312 Chronic venous hypertension (idiopathic) with ulcer of left lower extremity: Secondary | ICD-10-CM | POA: Diagnosis present

## 2020-02-15 NOTE — Progress Notes (Signed)
Maria, Murphy ([id number]) Visit Report for 02/15/2020 Abuse/Suicide Risk Screen Details Patient Name: Date of Service: Maria Murphy, [place] RIA 02/15/2020 2:45 PM Medical Record Number: [id number] Patient Account Number: [id number] Date of Birth/Sex: Treating RN: 1956/04/22 (64 y.o. Maria Maria Murphy Maria Murphy: Maria Murphy Other Clinician: Referring Vivika Poythress: Treating Tykira Wachs/Extender: [employer] Murphy Weeks in Treatment: 0 Abuse/Suicide Risk Screen Items Answer ABUSE RISK SCREEN: Has anyone close to you tried to hurt or harm you recentlyo No Do you feel uncomfortable with anyone in your familyo No Has anyone forced you do things that you didnt want to doo No Electronic Signature(s) Signed: 02/15/2020 5:29:15 PM By: Maria Murphy Entered By: Maria Murphy on 02/15/2020 15:18:08 -------------------------------------------------------------------------------- Activities of Daily Living Details Patient Name: Date of Service: Maria Murphy, [place] 02/15/2020 2:45 PM Medical Record Number: [id number] Patient Account Number: [id number] Date of Birth/Sex: Treating RN: 1955/11/10 (64 y.o. Maria Maria Murphy Maria Murphy: Maria Murphy Other Clinician: Referring [place] Leedy: Treating Meshell Abdulaziz/Extender: [employer] Murphy Weeks in Treatment: 0 Activities of Daily Living Items Answer Activities of Daily Living (Please select one for each item) Drive Automobile Completely Able T Medications ake Completely Able Use T elephone Completely Able Murphy for Appearance Completely Able Use T oilet Completely Able Bath / Shower Completely Able Dress Self Completely Able Feed Self Completely Able Walk Completely Able Get In / Out Bed Completely Able Housework Completely Able Prepare Meals Completely Maria Murphy for Self Completely Able Electronic Signature(s) Signed: 02/15/2020 5:29:15 PM By: Maria Murphy Entered By: Maria Murphy on 02/15/2020 15:18:27 -------------------------------------------------------------------------------- Education Screening Details Patient Name: Date of Service: Maria Murphy, [place] 02/15/2020 2:45 PM Medical Record Number: [id number] Patient Account Number: [id number] Date of Birth/Sex: Treating RN: 27-Sep-1955 (63 y.o. Maria Maria Murphy Maria Murphy: Maria Murphy Other Clinician: Referring Jillana Selph: Treating Diamond Jentz/Extender: Tomasa Hosteller Weeks in Treatment: 0 Primary Learner Assessed: Patient Learning Preferences/Education Level/Primary Language Learning Preference: Explanation Preferred Language: English Cognitive Barrier Language Barrier: No Translator Needed: No Memory Deficit: No Emotional Barrier: No Cultural/Religious Beliefs Affecting Medical Murphy: No Physical Barrier Impaired Vision: No Impaired Hearing: No Decreased Hand dexterity: No Knowledge/Comprehension Knowledge Level: High Comprehension Level: High Ability to understand written instructions: High Ability to understand verbal instructions: High Motivation Anxiety Level: Calm Cooperation: Cooperative Education Importance: Acknowledges Need Interest in Health Problems: Asks Questions Perception: Coherent Willingness to Engage in Self-Management High Activities: Readiness to Engage in Self-Management High Activities: Electronic Signature(s) Signed: 02/15/2020 5:29:15 PM By: Maria Murphy Entered By: Maria Murphy on 02/15/2020 15:18:44 -------------------------------------------------------------------------------- Fall Risk Assessment Details Patient Name: Date of Service: Maria Murphy, Maria Murphy 02/15/2020 2:45 PM Medical Record Number: [id number] Patient Account Number: [id number] Date of Birth/Sex: Treating RN: 1956/04/08 (63 y.o. Maria Maria Murphy Maria Murphy: Maria Murphy Other Clinician: Referring  Maria Murphy: Treating Maria Murphy/Extender: [employer] Murphy Weeks in Treatment: 0 Fall Risk Assessment Items Have you had 2 or more falls in the last 12 monthso 0 No Have you had any fall that resulted in injury in the last 12 monthso 0 No FALLS RISK SCREEN History of falling - immediate or within 3 months 0 No Secondary diagnosis (Do you have 2 or more medical diagnoseso) 0 No Ambulatory aid None/bed rest/wheelchair/nurse 0 No Crutches/cane/walker 15 Yes Furniture 0 No Intravenous therapy Access/Saline/Heparin Lock 0 No Gait/Transferring Normal/ bed rest/ wheelchair 0 Yes Weak (short steps with or without shuffle, stooped but able to  lift head while walking, may seek 0 No support from furniture) Impaired (short steps with shuffle, may have difficulty arising from chair, head down, impaired 0 No balance) Mental Status Oriented to own ability 0 Yes Electronic Signature(s) Signed: 02/15/2020 5:29:15 PM By: Maria Murphy Entered By: Maria Murphy on 02/15/2020 15:19:27 -------------------------------------------------------------------------------- Foot Assessment Details Patient Name: Date of Service: Maria Murphy, Maria Murphy 02/15/2020 2:45 PM Medical Record Number: [id number] Patient Account Number: [id number] Date of Birth/Sex: Treating RN: 1956/08/17 (64 y.o. Maria Maria Murphy Taylin Leder: Maria Murphy Other Clinician: Referring [place] Voytko: Treating Ottilia Pippenger/Extender: [employer] Murphy Weeks in Treatment: 0 Foot Assessment Items Site Locations + = Sensation present, - = Sensation absent, C = Callus, U = Ulcer R = Redness, W = Warmth, M = Maceration, PU = Pre-ulcerative lesion F = Fissure, S = Swelling, Murphy = Dryness Assessment Right: Left: Other Deformity: No No Prior Foot Ulcer: No No Prior Amputation: No No Charcot Joint: No No Ambulatory Status: Ambulatory With Help Assistance Device: Cane Gait: Steady Electronic  Signature(s) Signed: 02/15/2020 5:29:15 PM By: Maria Murphy Entered By: Maria Murphy on 02/15/2020 15:22:24 -------------------------------------------------------------------------------- Nutrition Risk Screening Details Patient Name: Date of Service: Maria Murphy, Maria Murphy Murphy 02/15/2020 2:45 PM Medical Record Number: [id number] Patient Account Number: [id number] Date of Birth/Sex: Treating RN: 1956/03/27 (65 y.o. Maria Maria Murphy Jalon Blackwelder: Maria Murphy Other Clinician: Referring Swade Shonka: Treating Stella Bortle/Extender: [employer] Murphy Weeks in Treatment: 0 Height (in): 64 Weight (lbs): 197 Body Mass Index (BMI): 33.8 Nutrition Risk Screening Items Score Screening NUTRITION RISK SCREEN: I have an illness or condition that made me change the kind and/or amount of food I eat 0 No I eat fewer than two meals per day 0 No I eat few fruits and vegetables, or milk products 0 No I have three or more drinks of beer, liquor or wine almost every day 0 No I have tooth or mouth problems that make it hard for me to eat 0 No I don't always have enough money to buy the food I need 0 No I eat alone most of the time 0 No I take three or more different prescribed or over-the-counter drugs a day 1 Yes Without wanting to, I have lost or gained 10 pounds in the last six months 0 No I am not always physically able to shop, cook and/or feed myself 0 No Nutrition Protocols Good Risk Protocol 0 No interventions needed Moderate Risk Protocol High Risk Proctocol Risk Level: Good Risk Score: 1 Electronic Signature(s) Signed: 02/15/2020 5:29:15 PM By: Maria Murphy Entered By: Maria Murphy on 02/15/2020 15:19:42

## 2020-02-16 NOTE — Progress Notes (Signed)
SAPPHIRA, HARJO ([id number]) Visit Report for 02/15/2020 Chief Complaint Document Details Patient Name: Date of Service: Maria Murphy, [place] RIA 02/15/2020 2:45 PM Medical Record Number: [id number] Patient Account Number: [id number] Date of Birth/Sex: Treating RN: 1955-11-24 (64 y.o. Maria Murphy Primary Care Provider: Rosita Fire Murphy Other Clinician: Referring Provider: Treating Provider/Extender: Maria Murphy Weeks in Treatment: 0 Information Obtained from: Patient Chief Complaint 02/15/2020; patient is here for review of a wound on the left medial lower extremity Electronic Signature(s) Signed: 02/16/2020 5:27:17 PM By: Maria Ham MD Entered By: Maria Murphy on 02/15/2020 15:55:47 -------------------------------------------------------------------------------- Debridement Details Patient Name: Date of Service: Maria Murphy, [place] RIA 02/15/2020 2:45 PM Medical Record Number: [id number] Patient Account Number: [id number] Date of Birth/Sex: Treating RN: March 18, 1956 (64 y.o. Maria Murphy, Maria Murphy Primary Care Provider: Rosita Fire Murphy Other Clinician: Referring Provider: Treating Provider/Extender: Maria Murphy Weeks in Treatment: 0 Debridement Performed for Assessment: Wound #1 Left,Medial Lower Leg Performed By: Physician Maria Murphy., MD Debridement Type: Debridement Level of Consciousness (Pre-procedure): Awake and Alert Pre-procedure Verification/Time Out Yes - 15:38 Taken: Start Time: 15:39 Pain Control: Lidocaine Injectable : 1 T Area Debrided (L x W): otal 1.2 (cm) x 1.5 (cm) = 1.8 (cm) Tissue and other material debrided: Non-Viable, Eschar, Fat, Slough, Subcutaneous, Skin: Dermis , Slough Level: Skin/Subcutaneous Tissue Debridement Description: Excisional Instrument: Curette Bleeding: Minimum Hemostasis Achieved: Pressure End Time: 15:44 Procedural Pain: 0 Post Procedural Pain: 2 Response to Treatment: Procedure was  tolerated well Level of Consciousness (Post- Awake and Alert procedure): Post Debridement Measurements of Total Wound Length: (cm) 1.2 Width: (cm) 1.5 Depth: (cm) 0.2 Volume: (cm) 0.283 Character of Wound/Ulcer Post Debridement: Requires Further Debridement Post Procedure Diagnosis Same as Pre-procedure Electronic Signature(s) Signed: 02/15/2020 5:34:37 PM By: Maria Murphy Signed: 02/16/2020 5:27:17 PM By: Maria Ham MD Entered By: Maria Murphy on 02/15/2020 15:55:26 -------------------------------------------------------------------------------- HPI Details Patient Name: Date of Service: Maria Murphy, [place] 02/15/2020 2:45 PM Medical Record Number: [id number] Patient Account Number: [id number] Date of Birth/Sex: Treating RN: 04-Feb-1956 (64 y.o. Maria Murphy Primary Care Provider: Rosita Fire Murphy Other Clinician: Referring Provider: Treating Provider/Extender: Maria Murphy Weeks in Treatment: 0 History of Present Illness HPI Description: ADMISSION 02/15/2020 This is a 64 year old woman who is a type II diabetic on oral agents. She states that 3 months ago a friend's cat scratched her on the medial left lower extremity. This opened into a wound she has not been able to get this to close. She has largely been using topical antibiotic ointments of various description over-the-counter. She saw Dr. Legrand Murphy who is her primary doctor on 6/10 she was given a course of doxycycline which she is finished. Past medical history includes knee pain bilaterally secondary to osteoarthritis, hypertension, type 2 diabetes, probable diabetic neuropathy ABI was 0.96 on the left Electronic Signature(s) Signed: 02/16/2020 5:27:17 PM By: Maria Ham MD Entered By: Maria Murphy on 02/15/2020 15:57:01 -------------------------------------------------------------------------------- Physical Exam Details Patient Name: Date of Service: Maria Murphy, [place] RIA 02/15/2020 2:45  PM Medical Record Number: [id number] Patient Account Number: [id number] Date of Birth/Sex: Treating RN: 1956/08/07 (64 y.o. Maria Murphy Primary Care Provider: Rosita Fire Murphy Other Clinician: Referring Provider: Treating Provider/Extender: Maria Murphy Weeks in Treatment: 0 Constitutional Patient is hypertensive.. Pulse regular and within target range for patient.Marland Kitchen Respirations regular, non-labored and within target range.. Temperature is normal and within the target range for the patient.Marland Kitchen Appears in no distress. Respiratory work of breathing  is normal. Bilateral breath sounds are clear and equal in all lobes with no wheezes, rales or rhonchi.. Cardiovascular Heart rhythm and rate regular, without murmur or gallop.. Popliteal pulses palpable. Pedal pulses on the left are palpable. Integumentary (Hair, Skin) Multiple skin excoriations on her bilateral lower extremities secondary to pruritus. I am not sure of the etiology here.. Neurological Diabetic insensate neuropathy to light touch and vibration. Psychiatric appears at normal baseline. Notes Wound exam; the area in question is on the left medial lower extremity. Initially presenting as a small wound with surface eschar. I removed this with a #5 curette. There was completely nonviable tissue underneath this which I also removed. At that point the patient complained of too much pain. I used injectable lidocaine for anesthesia. I then continued the debridement until there was viable tissue. It is likely she is still going to require debridement next week. Electronic Signature(s) Signed: 02/16/2020 5:27:17 PM By: Maria Ham MD Entered By: Maria Murphy on 02/15/2020 15:58:48 -------------------------------------------------------------------------------- Physician Orders Details Patient Name: Date of Service: Maria Murphy, [place] 02/15/2020 2:45 PM Medical Record Number: [id number] Patient Account Number:  [id number] Date of Birth/Sex: Treating RN: 12-08-1955 (64 y.o. Maria Murphy Primary Care Provider: Rosita Fire Murphy Other Clinician: Referring Provider: Treating Provider/Extender: Maria Murphy Weeks in Treatment: 0 Verbal / Phone Orders: No Diagnosis Coding Follow-up Appointments Return Appointment in 1 week. Dressing Change Frequency Do not change entire dressing for one week. Skin Barriers/Peri-Wound Care Moisturizing lotion - patient to lotion right leg every night. TCA Cream or Ointment - mixed with lotion. apply liberally to left leg in clinic. Wound Cleansing May shower with protection. - use cast protector for showering and protection of wrap. Primary Wound Dressing Wound #1 Left,Medial Lower Leg Iodoflex Secondary Dressing Dry Gauze ABD pad Edema Control 3 Layer Compression System - Left Lower Extremity Avoid standing for long periods of time Elevate legs to the level of the heart or above for 30 minutes daily and/or when sitting, a frequency of: - throughout the day Exercise regularly Patient Medications llergies: wheat A Notifications Medication Indication Start End lidocaine DOSE topical 4 % gel - gel topical applied only in clinic for debridement by MD. Electronic Signature(s) Signed: 02/15/2020 5:34:37 PM By: Maria Murphy Signed: 02/16/2020 5:27:17 PM By: Maria Ham MD Entered By: Maria Murphy on 02/15/2020 15:49:24 Prescription 02/15/2020 -------------------------------------------------------------------------------- Marena Chancy MD Patient Name: Provider: [birth year] [id number] Date of Birth: NPI#Wanda [id number] [id number] Sex: DEA #: [phone] Phone #: License #: [place] Patient Address: [address] [place], [place] [address] [place], [place] [address] [phone] Allergies Name Reaction  Severity wheat Medication Medication: Route: Strength: Form: lidocaine topical 4% gel Class: TOPICAL LOCAL ANESTHETICS Dose: Frequency / Time: Indication: gel topical applied only in clinic for debridement by MD. Number of Refills: Number of Units: 0 Generic Substitution: Start Date: End Date: Administered at Facility: Substitution Permitted Yes Time Administered: Time Discontinued: Note to Pharmacy: Hand Signature: Date(s): Electronic Signature(s) Signed: 02/15/2020 5:34:37 PM By: Maria Murphy Signed: 02/16/2020 5:27:17 PM By: Maria Ham MD Entered By: Maria Murphy on 02/15/2020 15:49:24 -------------------------------------------------------------------------------- Problem List Details Patient Name: Date of Service: Maria Murphy, [place] 02/15/2020 2:45 PM Medical Record Number: [id number] Patient Account Number: [id number] Date of Birth/Sex: Treating RN: 1956-08-14 (64 y.o. Maria Murphy Primary Care Provider: Rosita Fire Murphy Other Clinician: Referring Provider: Treating Provider/Extender: Elise Benne, Brandon Melnick Murphy Weeks  in Treatment: 0 Active Problems ICD-10 Encounter Code Description Active Date MDM Diagnosis L97.222 Non-pressure chronic ulcer of left calf with fat layer exposed 02/15/2020 No Yes I87.312 Chronic venous hypertension (idiopathic) with ulcer of left lower extremity 02/15/2020 No Yes I89.0 Lymphedema, not elsewhere classified 02/15/2020 No Yes Inactive Problems Resolved Problems Electronic Signature(s) Signed: 02/16/2020 5:27:17 PM By: Maria Ham MD Entered By: Maria Murphy on 02/15/2020 15:55:00 -------------------------------------------------------------------------------- Progress Note Details Patient Name: Date of Service: Maria Murphy, [place] 02/15/2020 2:45 PM Medical Record Number: [id number] Patient Account Number: [id number] Date of Birth/Sex: Treating RN: 1956-01-15 (64 y.o. Maria Murphy Primary Care Provider:  Rosita Fire Murphy Other Clinician: Referring Provider: Treating Provider/Extender: Maria Murphy Weeks in Treatment: 0 Subjective Chief Complaint Information obtained from Patient 02/15/2020; patient is here for review of a wound on the left medial lower extremity History of Present Illness (HPI) ADMISSION 02/15/2020 This is a 64 year old woman who is a type II diabetic on oral agents. She states that 3 months ago a friend's cat scratched her on the medial left lower extremity. This opened into a wound she has not been able to get this to close. She has largely been using topical antibiotic ointments of various description over-the-counter. She saw Dr. Legrand Murphy who is her primary doctor on 6/10 she was given a course of doxycycline which she is finished. Past medical history includes knee pain bilaterally secondary to osteoarthritis, hypertension, type 2 diabetes, probable diabetic neuropathy ABI was 0.96 on the left Patient History Information obtained from Patient. Allergies wheat General Notes: patient thinks its a form of wheat Family History Heart Disease - Maternal Grandparents, Hypertension - Maternal Grandparents, Lung Disease - Mother, Thyroid Problems - Mother,Siblings, No family history of Cancer, Diabetes, Hereditary Spherocytosis, Kidney Disease, Seizures, Stroke, Tuberculosis. Social History Never smoker, Marital Status - Widowed, Alcohol Use - Rarely - wine on weekends, Drug Use - No History, Caffeine Use - Moderate. Medical History Eyes Denies history of Cataracts, Glaucoma, Optic Neuritis Ear/Nose/Mouth/Throat Denies history of Chronic sinus problems/congestion, Middle ear problems Hematologic/Lymphatic Denies history of Anemia, Hemophilia, Human Immunodeficiency Virus, Lymphedema, Sickle Cell Disease Respiratory Denies history of Aspiration, Asthma, Chronic Obstructive Pulmonary Disease (COPD), Pneumothorax, Sleep Apnea,  Tuberculosis Cardiovascular Patient has history of Arrhythmia, Hypertension Denies history of Angina, Congestive Heart Failure, Coronary Artery Disease, Deep Vein Thrombosis, Hypotension, Myocardial Infarction, Peripheral Arterial Disease, Peripheral Venous Disease, Phlebitis, Vasculitis Gastrointestinal Denies history of Cirrhosis , Colitis, Crohnoos, Hepatitis A, Hepatitis B, Hepatitis C Endocrine Denies history of Type I Diabetes, Type II Diabetes Genitourinary Denies history of End Stage Renal Disease Immunological Denies history of Lupus Erythematosus, Raynaudoos, Scleroderma Integumentary (Skin) Denies history of History of Burn Musculoskeletal Denies history of Gout, Rheumatoid Arthritis, Osteoarthritis, Osteomyelitis Neurologic Denies history of Dementia, Neuropathy, Quadriplegia, Paraplegia, Seizure Disorder Oncologic Denies history of Received Chemotherapy, Received Radiation Psychiatric Denies history of Anorexia/bulimia, Confinement Anxiety Hospitalization/Surgery History - tonsillectomy, 1972. Medical A Surgical History Notes nd Endocrine Pre-Diabetic, takes metformin Musculoskeletal arthritis Psychiatric depression Review of Systems (ROS) Constitutional Symptoms (General Health) Denies complaints or symptoms of Fatigue, Fever, Chills, Marked Weight Change. Eyes Denies complaints or symptoms of Dry Eyes, Vision Changes, Glasses / Contacts. Ear/Nose/Mouth/Throat Denies complaints or symptoms of Chronic sinus problems or rhinitis. Cardiovascular Denies complaints or symptoms of Chest pain. Gastrointestinal Denies complaints or symptoms of Frequent diarrhea, Nausea, Vomiting. Endocrine Denies complaints or symptoms of Heat/cold intolerance. Genitourinary Denies complaints or symptoms of Frequent urination. Integumentary (Skin) Denies complaints or symptoms of Wounds, left  lower leg Musculoskeletal Denies complaints or symptoms of Muscle Pain, Muscle  Weakness. Neurologic Denies complaints or symptoms of Numbness/parasthesias. Psychiatric Denies complaints or symptoms of Claustrophobia, Suicidal. Objective Constitutional Patient is hypertensive.. Pulse regular and within target range for patient.Marland Kitchen Respirations regular, non-labored and within target range.. Temperature is normal and within the target range for the patient.Marland Kitchen Appears in no distress. Vitals Time Taken: 3:10 PM, Height: 64 in, Source: Stated, Weight: 197 lbs, Source: Stated, BMI: 33.8, Temperature: 98.2 F, Pulse: 67 bpm, Respiratory Rate: 18 breaths/min, Blood Pressure: 156/84 mmHg. Respiratory work of breathing is normal. Bilateral breath sounds are clear and equal in all lobes with no wheezes, rales or rhonchi.. Cardiovascular Heart rhythm and rate regular, without murmur or gallop.. Popliteal pulses palpable. Pedal pulses on the left are palpable. Neurological Diabetic insensate neuropathy to light touch and vibration. Psychiatric appears at normal baseline. General Notes: Wound exam; the area in question is on the left medial lower extremity. Initially presenting as a small wound with surface eschar. I removed this with a #5 curette. There was completely nonviable tissue underneath this which I also removed. At that point the patient complained of too much pain. I used injectable lidocaine for anesthesia. I then continued the debridement until there was viable tissue. It is likely she is still going to require debridement next week. Integumentary (Hair, Skin) Multiple skin excoriations on her bilateral lower extremities secondary to pruritus. I am not sure of the etiology here.. Wound #1 status is Open. Original cause of wound was Trauma. The wound is located on the Left,Medial Lower Leg. The wound measures 1.2cm length x 1.5cm width x 0.1cm depth; 1.414cm^2 area and 0.141cm^3 volume. There is no tunneling or undermining noted. There is a none present amount of  drainage noted. The wound margin is distinct with the outline attached to the wound base. There is no granulation within the wound bed. There is a large (67-100%) amount of necrotic tissue within the wound bed including Eschar. Assessment Active Problems ICD-10 Non-pressure chronic ulcer of left calf with fat layer exposed Chronic venous hypertension (idiopathic) with ulcer of left lower extremity Lymphedema, not elsewhere classified Procedures Wound #1 Pre-procedure diagnosis of Wound #1 is a Trauma, Other located on the Left,Medial Lower Leg . There was a Excisional Skin/Subcutaneous Tissue Debridement with a total area of 1.8 sq cm performed by Maria Murphy., MD. With the following instrument(s): Curette to remove Non-Viable tissue/material. Material removed includes Fat, Eschar, Subcutaneous Tissue, Slough, and Skin: Dermis after achieving pain control using Lidocaine Injectable: 1%. A time out was conducted at 15:38, prior to the start of the procedure. A Minimum amount of bleeding was controlled with Pressure. The procedure was tolerated well with a pain level of 0 throughout and a pain level of 2 following the procedure. Post Debridement Measurements: 1.2cm length x 1.5cm width x 0.2cm depth; 0.283cm^3 volume. Character of Wound/Ulcer Post Debridement requires further debridement. Post procedure Diagnosis Wound #1: Same as Pre-Procedure Plan Follow-up Appointments: Return Appointment in 1 week. Dressing Change Frequency: Do not change entire dressing for one week. Skin Barriers/Peri-Wound Care: Moisturizing lotion - patient to lotion right leg every night. TCA Cream or Ointment - mixed with lotion. apply liberally to left leg in clinic. Wound Cleansing: May shower with protection. - use cast protector for showering and protection of wrap. Primary Wound Dressing: Wound #1 Left,Medial Lower Leg: Iodoflex Secondary Dressing: Dry Gauze ABD pad Edema Control: 3 Layer  Compression System - Left Lower Extremity Avoid  standing for long periods of time Elevate legs to the level of the heart or above for 30 minutes daily and/or when sitting, a frequency of: - throughout the day Exercise regularly The following medication(s) was prescribed: lidocaine topical 4 % gel gel topical applied only in clinic for debridement by MD. was prescribed at facility 1. Iodoflex under 3 layer compression 2. She has Medicaid which will mean that we will have to probably keep her leg under compression. She will not be able to change this, we could not supply her with wound care products 3. Likely she is going to require debridement next week hopefully not as extensively as today 4. I am not certain why she has such pruritus. I am going to use TCA and moisturizer under the wrap hopefully to help keep this under control. I spent 32 minutes in review of this patient's past medical history face-to-face evaluation and preparation of this record Electronic Signature(s) Signed: 02/16/2020 5:27:17 PM By: Maria Ham MD Entered By: Maria Murphy on 02/15/2020 16:00:03 -------------------------------------------------------------------------------- HxROS Details Patient Name: Date of Service: Maria Murphy, [place] 02/15/2020 2:45 PM Medical Record Number: [id number] Patient Account Number: [id number] Date of Birth/Sex: Treating RN: Feb 08, 1956 (64 y.o. Clearnce Sorrel Primary Care Provider: Rosita Fire Murphy Other Clinician: Referring Provider: Treating Provider/Extender: Maria Murphy Weeks in Treatment: 0 Information Obtained From Patient Constitutional Symptoms (General Health) Complaints and Symptoms: Negative for: Fatigue; Fever; Chills; Marked Weight Change Eyes Complaints and Symptoms: Negative for: Dry Eyes; Vision Changes; Glasses / Contacts Medical History: Negative for: Cataracts; Glaucoma; Optic Neuritis Ear/Nose/Mouth/Throat Complaints and  Symptoms: Negative for: Chronic sinus problems or rhinitis Medical History: Negative for: Chronic sinus problems/congestion; Middle ear problems Cardiovascular Complaints and Symptoms: Negative for: Chest pain Medical History: Positive for: Arrhythmia; Hypertension Negative for: Angina; Congestive Heart Failure; Coronary Artery Disease; Deep Vein Thrombosis; Hypotension; Myocardial Infarction; Peripheral Arterial Disease; Peripheral Venous Disease; Phlebitis; Vasculitis Gastrointestinal Complaints and Symptoms: Negative for: Frequent diarrhea; Nausea; Vomiting Medical History: Negative for: Cirrhosis ; Colitis; Crohns; Hepatitis A; Hepatitis B; Hepatitis C Endocrine Complaints and Symptoms: Negative for: Heat/cold intolerance Medical History: Negative for: Type I Diabetes; Type II Diabetes Past Medical History Notes: Pre-Diabetic, takes metformin Genitourinary Complaints and Symptoms: Negative for: Frequent urination Medical History: Negative for: End Stage Renal Disease Integumentary (Skin) Complaints and Symptoms: Negative for: Wounds Review of System Notes: left lower leg Medical History: Negative for: History of Burn Musculoskeletal Complaints and Symptoms: Negative for: Muscle Pain; Muscle Weakness Medical History: Negative for: Gout; Rheumatoid Arthritis; Osteoarthritis; Osteomyelitis Past Medical History Notes: arthritis Neurologic Complaints and Symptoms: Negative for: Numbness/parasthesias Medical History: Negative for: Dementia; Neuropathy; Quadriplegia; Paraplegia; Seizure Disorder Psychiatric Complaints and Symptoms: Negative for: Claustrophobia; Suicidal Medical History: Negative for: Anorexia/bulimia; Confinement Anxiety Past Medical History Notes: depression Hematologic/Lymphatic Medical History: Negative for: Anemia; Hemophilia; Human Immunodeficiency Virus; Lymphedema; Sickle Cell Disease Respiratory Medical History: Negative for:  Aspiration; Asthma; Chronic Obstructive Pulmonary Disease (COPD); Pneumothorax; Sleep Apnea; Tuberculosis Immunological Medical History: Negative for: Lupus Erythematosus; Raynauds; Scleroderma Oncologic Medical History: Negative for: Received Chemotherapy; Received Radiation Immunizations Pneumococcal Vaccine: Received Pneumococcal Vaccination: No Implantable Devices None Hospitalization / Surgery History Type of Hospitalization/Surgery tonsillectomy, 1972 Family and Social History Cancer: No; Diabetes: No; Heart Disease: Yes - Maternal Grandparents; Hereditary Spherocytosis: No; Hypertension: Yes - Maternal Grandparents; Kidney Disease: No; Lung Disease: Yes - Mother; Seizures: No; Stroke: No; Thyroid Problems: Yes - Mother,Siblings; Tuberculosis: No; Never smoker; Marital Status - Widowed; Alcohol Use: Rarely - wine on  weekends; Drug Use: No History; Caffeine Use: Moderate; Financial Concerns: No; Food, Clothing or Shelter Needs: No; Support System Lacking: No; Transportation Concerns: No Electronic Signature(s) Signed: 02/15/2020 5:29:15 PM By: Kela Millin Signed: 02/16/2020 5:27:17 PM By: Maria Ham MD Entered By: Kela Millin on 02/15/2020 15:18:02 -------------------------------------------------------------------------------- SuperBill Details Patient Name: Date of Service: Maria Murphy, [place] RIA 02/15/2020 Medical Record Number: [id number] Patient Account Number: [id number] Date of Birth/Sex: Treating RN: 02-Oct-1955 (64 y.o. Maria Murphy Primary Care Provider: Rosita Fire Murphy Other Clinician: Referring Provider: Treating Provider/Extender: Maria Murphy Weeks in Treatment: 0 Diagnosis Coding ICD-10 Codes Code Description [phone] Non-pressure chronic ulcer of left calf with fat layer exposed I87.312 Chronic venous hypertension (idiopathic) with ulcer of left lower extremity I89.0 Lymphedema, not elsewhere classified E11.622 Type 2  diabetes mellitus with other skin ulcer Facility Procedures CPT4 Code: [id number] Description: 99213 - WOUND CARE VISIT-LEV 3 EST PT Modifier: Quantity: 1 CPT4 Code: [id number] Description: 11042 - DEB SUBQ TISSUE 20 SQ CM/< ICD-10 Diagnosis Description L97.222 Non-pressure chronic ulcer of left calf with fat layer exposed Modifier: Quantity: 1 Physician Procedures : CPT4 Code Description Modifier [id number] WC PHYS LEVEL 3 NEW PT 25 ICD-10 Diagnosis Description L97.222 Non-pressure chronic ulcer of left calf with fat layer exposed I87.312 Chronic venous hypertension (idiopathic) with ulcer of left lower extremity  I89.0 Lymphedema, not elsewhere classified E11.622 Type 2 diabetes mellitus with other skin ulcer Quantity: 1 : [id number] 11042 - WC PHYS SUBQ TISS 20 SQ CM ICD-10 Diagnosis Description L97.222 Non-pressure chronic ulcer of left calf with fat layer exposed Quantity: 1 Electronic Signature(s) Signed: 02/15/2020 5:34:37 PM By: Maria Murphy Signed: 02/16/2020 5:27:17 PM By: Maria Ham MD Entered By: Maria Murphy on 02/15/2020 17:17:11

## 2020-02-16 NOTE — Progress Notes (Signed)
Fat Layer (Subcutaneous Tissue) Exposed: No Tendon Exposed: No Muscle Exposed: No Joint Exposed: No Bone Exposed: No Treatment Notes Wound #1 (Left, Medial Lower Leg) 2. Periwound Care Moisturizing lotion TCA Cream 3. Primary Dressing Applied Iodoflex 4. Secondary Dressing Dry Gauze 6. Support Layer Applied 3 layer compression wrap Electronic Signature(s) Signed: 02/15/2020 5:29:15 PM By: Kela Millin Entered By: Kela Millin on 02/15/2020 15:25:20 -------------------------------------------------------------------------------- Vitals Details Patient Name: Date of Service: Maria Murphy, [place] RIA 02/15/2020 2:45 PM Medical Record Number: [id number] Patient Account Number: [id number] Date of Birth/Sex: Treating RN: [birth year] (64 y.o. Maria Murphy Primary Care Dmani Mizer: Rosita Fire D Other Clinician: Referring Tymel Conely: Treating Rashel Okeefe/Extender: [employer] Weeks in Treatment: 0 Vital Signs Time Taken: 15:10 Temperature (F): 98.2 Height (in): 64 Pulse (bpm): 67 Source: Stated Respiratory Rate (breaths/min): 18 Weight (lbs): 197 Blood Pressure (mmHg): 156/84 Source: Stated Reference Range: 80 - 120 mg / dl Body Mass Index (BMI): 33.8 Electronic Signature(s) Signed: 02/15/2020 5:29:15 PM By: Kela Millin Entered By: Kela Millin on 02/15/2020 15:10:49  Maria Murphy, Maria Murphy ([id number]) Visit Report for 02/15/2020 Allergy List Details Patient Name: Date of Service: Maria Murphy, [place] RIA 02/15/2020 2:45 PM Medical Record Number: [id number] Patient Account Number: [id number] Date of Birth/Sex: Treating RN: 11-26-1955 (64 y.o. Maria Murphy Primary Care Braiden Rodman: Rosita Fire D Other Clinician: Referring Kellan Raffield: Treating Priti Consoli/Extender: [employer] Weeks in Treatment: 0 Allergies Active Allergies wheat Allergy Notes patient thinks its a form of wheat Electronic Signature(s) Signed: 02/15/2020 5:29:15 PM By: Kela Millin Entered By: Kela Millin on 02/15/2020 15:23:02 -------------------------------------------------------------------------------- Arrival Information Details Patient Name: Date of Service: Maria Murphy, [place] RIA 02/15/2020 2:45 PM Medical Record Number: [id number] Patient Account Number: [id number] Date of Birth/Sex: Treating RN: June 25, 1956 (64 y.o. Maria Murphy Primary Care Daven Pinckney: Rosita Fire D Other Clinician: Referring Greysen Devino: Treating Aarianna Hoadley/Extender: Tomasa Hosteller Weeks in Treatment: 0 Visit Information Patient Arrived: [name] Arrival Time: 15:09 Accompanied By: self Transfer Assistance: None Patient Identification Verified: Yes Secondary Verification Process Completed: Yes Patient Has Alerts: Yes Electronic Signature(s) Signed: 02/15/2020 5:29:15 PM By: Kela Millin Entered By: Kela Millin on 02/15/2020 15:10:11 -------------------------------------------------------------------------------- Clinic Level of Care Assessment Details Patient Name: Date of Service: Maria Murphy, [place] 02/15/2020 2:45 PM Medical Record Number: [id number] Patient Account Number: [id number] Date of Birth/Sex: Treating RN: [birth year] (64 y.o. [name] Primary Care Makeda Peeks: Rosita [employer] D Other Clinician: Referring Chong Wojdyla: Treating  Bracken Moffa/Extender: [employer] Weeks in Treatment: 0 Clinic Level of Care Assessment Items TOOL 1 Quantity Score X- 1 0 Use when EandM and Procedure is performed on INITIAL visit ASSESSMENTS - Nursing Assessment / Reassessment X- 1 20 General Physical Exam (combine w/ comprehensive assessment (listed just below) when performed on new pt. evals) X- 1 25 Comprehensive Assessment (HX, ROS, Risk Assessments, Wounds Hx, etc.) ASSESSMENTS - Wound and Skin Assessment / Reassessment X- 1 10 Dermatologic / Skin Assessment (not related to wound area) ASSESSMENTS - Ostomy and/or Continence Assessment and Care '[]'$  - 0 Incontinence Assessment and Management '[]'$  - 0 Ostomy Care Assessment and Management (repouching, etc.) PROCESS - Coordination of Care X - Simple Patient / Family Education for ongoing care 1 15 '[]'$  - 0 Complex (extensive) Patient / Family Education for ongoing care X- 1 10 Staff obtains Programmer, systems, Records, T Results / Process Orders est '[]'$  - 0 Staff telephones HHA, Nursing Homes / Clarify orders / etc '[]'$  - 0 Routine Transfer to another Facility (non-emergent condition) '[]'$  - 0 Routine Hospital Admission (non-emergent condition) X- 1 15 New Admissions / Biomedical engineer / Ordering NPWT Apligraf, etc. , '[]'$  - 0 Emergency Hospital Admission (emergent condition) PROCESS - Special Needs '[]'$  - 0 Pediatric / Minor Patient Management '[]'$  - 0 Isolation Patient Management '[]'$  - 0 Hearing / Language / Visual special needs '[]'$  - 0 Assessment of Community assistance (transportation, D/C planning, etc.) '[]'$  - 0 Additional assistance / Altered mentation '[]'$  - 0 Support Surface(s) Assessment (bed, cushion, seat, etc.) INTERVENTIONS - Miscellaneous '[]'$  - 0 External ear exam '[]'$  - 0 Patient Transfer (multiple staff / Civil Service fast streamer / Similar devices) '[]'$  - 0 Simple Staple / Suture removal (25 or less) '[]'$  - 0 Complex Staple / Suture removal (26 or more) '[]'$  -  0 Hypo/Hyperglycemic Management (do not check if billed separately) X- 1 15 Ankle / Brachial Index (ABI) - do not check if billed separately Has the patient been seen at the hospital within the last three years: Yes Total Score: 110 Level Of Care: New/Established -  Level 3 Electronic Signature(s) Signed: 02/15/2020 5:34:37 PM By: Deon Pilling Entered By: Deon Pilling on 02/15/2020 15:49:48 -------------------------------------------------------------------------------- Encounter Discharge Information Details Patient Name: Date of Service: Maria Murphy, [place] RIA 02/15/2020 2:45 PM Medical Record Number: [id number] Patient Account Number: [id number] Date of Birth/Sex: Treating RN: Feb 10, 1956 (64 y.o. [name] Primary Care Rochelle Nephew: Rosita Fire D Other Clinician: Referring Mccayla Shimada: Treating Ashleynicole Mcclees/Extender: [employer] Weeks in Treatment: 0 Encounter Discharge Information Items Post Procedure Vitals Discharge Condition: Stable Temperature (F): 98.2 Ambulatory Status: Cane Pulse (bpm): 67 Discharge Destination: Home Respiratory Rate (breaths/min): 18 Transportation: Private Auto Blood Pressure (mmHg): 156/84 Accompanied By: self Schedule Follow-up Appointment: Yes Clinical Summary of Care: Patient Declined Electronic Signature(s) Signed: 02/15/2020 5:20:52 PM By: Baruch Gouty RN, BSN Entered By: Baruch Gouty on 02/15/2020 16:16:49 -------------------------------------------------------------------------------- Lower Extremity Assessment Details Patient Name: Date of Service: Maria Murphy, [place] 02/15/2020 2:45 PM Medical Record Number: [id number] Patient Account Number: [id number] Date of Birth/Sex: Treating RN: [birth year] (64 y.o. Maria Murphy Primary Care Jannet Calip: Rosita Fire D Other Clinician: Referring Daysi Boggan: Treating Liddy Deam/Extender: [employer] Weeks in Treatment: 0 Edema Assessment Assessed:  [Left: No] [Right: No] E[Left: dema] [Right: :] Calf Left: Right: Point of Measurement: 29 cm From Medial Instep 41.5 cm cm Ankle Left: Right: Point of Measurement: 10 cm From Medial Instep 24.2 cm cm Vascular Assessment Pulses: Dorsalis Pedis Palpable: [Left:Yes] Blood Pressure: Brachial: [Left:156] Ankle: [Left:Dorsalis Pedis: 150 0.96] Electronic Signature(s) Signed: 02/15/2020 5:29:15 PM By: Kela Millin Entered By: Kela Millin on 02/15/2020 15:27:08 -------------------------------------------------------------------------------- Multi Wound Chart Details Patient Name: Date of Service: Maria Murphy, [place] 02/15/2020 2:45 PM Medical Record Number: [id number] Patient Account Number: [id number] Date of Birth/Sex: Treating RN: 12-Jul-1956 (64 y.o. [name] Primary Care Arlone Lenhardt: Rosita Fire D Other Clinician: Referring Aneyah Lortz: Treating Lucely Leard/Extender: [employer] Weeks in Treatment: 0 Vital Signs Height(in): 64 Pulse(bpm): 67 Weight(lbs): 197 Blood Pressure(mmHg): 156/84 Body Mass Index(BMI): 34 Temperature(F): 98.2 Respiratory Rate(breaths/min): 18 Photos: [1:No Photos Left, Medial Lower Leg] [N/A:N/A N/A] Wound Location: [1:Trauma] [N/A:N/A] Wounding Event: [1:Trauma, Other] [N/A:N/A] Primary Etiology: [1:Arrhythmia, Hypertension] [N/A:N/A] Comorbid History: [1:11/02/2019] [N/A:N/A] Date Acquired: [1:0] [N/A:N/A] Weeks of Treatment: [1:Open] [N/A:N/A] Wound Status: [1:1.2x1.5x0.1] [N/A:N/A] Measurements L x W x D (cm) [1:1.414] [N/A:N/A] A (cm) : rea [1:0.141] [N/A:N/A] Volume (cm) : [1:Unclassifiable] [N/A:N/A] Classification: [1:None Present] [N/A:N/A] Exudate A mount: [1:Distinct, outline attached] [N/A:N/A] Wound Margin: [1:None Present (0%)] [N/A:N/A] Granulation A mount: [1:Large (67-100%)] [N/A:N/A] Necrotic A mount: [1:Eschar] [N/A:N/A] Necrotic Tissue: [1:Fascia: No] [N/A:N/A] Exposed  Structures: [1:Fat Layer (Subcutaneous Tissue) Exposed: No Tendon: No Muscle: No Joint: No Bone: No None] [N/A:N/A] Epithelialization: [1:Debridement - Excisional] [N/A:N/A] Debridement: Pre-procedure Verification/Time Out 15:38 [N/A:N/A] Taken: [1:Lidocaine Injectable] [N/A:N/A] Pain Control: [1:Necrotic/Eschar, Fat, Subcutaneous,] [N/A:N/A] Tissue Debrided: [1:Slough Skin/Subcutaneous Tissue] [N/A:N/A] Level: [1:1.8] [N/A:N/A] Debridement A (sq cm): [1:rea Curette] [N/A:N/A] Instrument: [1:Minimum] [N/A:N/A] Bleeding: [1:Pressure] [N/A:N/A] Hemostasis Achieved: [1:0] [N/A:N/A] Procedural Pain: [1:2] [N/A:N/A] Post Procedural Pain: Debridement Treatment Response: Procedure was tolerated well [N/A:N/A] Post Debridement Measurements L x 1.2x1.5x0.2 [N/A:N/A] W x D (cm) [1:0.283] [N/A:N/A] Post Debridement Volume: (cm) [1:Debridement] [N/A:N/A] Treatment Notes Electronic Signature(s) Signed: 02/15/2020 5:34:37 PM By: Deon Pilling Signed: 02/16/2020 5:27:17 PM By: Linton Ham MD Entered By: Linton Ham on 02/15/2020 15:55:11 -------------------------------------------------------------------------------- Multi-Disciplinary Care Plan Details Patient Name: Date of Service: Maria Murphy, [place] 02/15/2020 2:45 PM Medical Record Number: [id number] Patient Account Number: [id number] Date of Birth/Sex: Treating RN: May 31, 1956 (64 y.o. F) [place], [place]  Maria Murphy, Maria Murphy ([id number]) Visit Report for 02/15/2020 Allergy List Details Patient Name: Date of Service: Maria Murphy, [place] RIA 02/15/2020 2:45 PM Medical Record Number: [id number] Patient Account Number: [id number] Date of Birth/Sex: Treating RN: 11-26-1955 (64 y.o. Maria Murphy Primary Care Braiden Rodman: Rosita Fire D Other Clinician: Referring Kellan Raffield: Treating Priti Consoli/Extender: [employer] Weeks in Treatment: 0 Allergies Active Allergies wheat Allergy Notes patient thinks its a form of wheat Electronic Signature(s) Signed: 02/15/2020 5:29:15 PM By: Kela Millin Entered By: Kela Millin on 02/15/2020 15:23:02 -------------------------------------------------------------------------------- Arrival Information Details Patient Name: Date of Service: Maria Murphy, [place] RIA 02/15/2020 2:45 PM Medical Record Number: [id number] Patient Account Number: [id number] Date of Birth/Sex: Treating RN: June 25, 1956 (64 y.o. Maria Murphy Primary Care Daven Pinckney: Rosita Fire D Other Clinician: Referring Greysen Devino: Treating Aarianna Hoadley/Extender: Tomasa Hosteller Weeks in Treatment: 0 Visit Information Patient Arrived: [name] Arrival Time: 15:09 Accompanied By: self Transfer Assistance: None Patient Identification Verified: Yes Secondary Verification Process Completed: Yes Patient Has Alerts: Yes Electronic Signature(s) Signed: 02/15/2020 5:29:15 PM By: Kela Millin Entered By: Kela Millin on 02/15/2020 15:10:11 -------------------------------------------------------------------------------- Clinic Level of Care Assessment Details Patient Name: Date of Service: Maria Murphy, [place] 02/15/2020 2:45 PM Medical Record Number: [id number] Patient Account Number: [id number] Date of Birth/Sex: Treating RN: [birth year] (64 y.o. [name] Primary Care Makeda Peeks: Rosita Fire D Other Clinician: Referring Chong Wojdyla: Treating  Bracken Moffa/Extender: [employer] Weeks in Treatment: 0 Clinic Level of Care Assessment Items TOOL 1 Quantity Score X- 1 0 Use when EandM and Procedure is performed on INITIAL visit ASSESSMENTS - Nursing Assessment / Reassessment X- 1 20 General Physical Exam (combine w/ comprehensive assessment (listed just below) when performed on new pt. evals) X- 1 25 Comprehensive Assessment (HX, ROS, Risk Assessments, Wounds Hx, etc.) ASSESSMENTS - Wound and Skin Assessment / Reassessment X- 1 10 Dermatologic / Skin Assessment (not related to wound area) ASSESSMENTS - Ostomy and/or Continence Assessment and Care '[]'$  - 0 Incontinence Assessment and Management '[]'$  - 0 Ostomy Care Assessment and Management (repouching, etc.) PROCESS - Coordination of Care X - Simple Patient / Family Education for ongoing care 1 15 '[]'$  - 0 Complex (extensive) Patient / Family Education for ongoing care X- 1 10 Staff obtains Programmer, systems, Records, T Results / Process Orders est '[]'$  - 0 Staff telephones HHA, Nursing Homes / Clarify orders / etc '[]'$  - 0 Routine Transfer to another Facility (non-emergent condition) '[]'$  - 0 Routine Hospital Admission (non-emergent condition) X- 1 15 New Admissions / Biomedical engineer / Ordering NPWT Apligraf, etc. , '[]'$  - 0 Emergency Hospital Admission (emergent condition) PROCESS - Special Needs '[]'$  - 0 Pediatric / Minor Patient Management '[]'$  - 0 Isolation Patient Management '[]'$  - 0 Hearing / Language / Visual special needs '[]'$  - 0 Assessment of Community assistance (transportation, D/C planning, etc.) '[]'$  - 0 Additional assistance / Altered mentation '[]'$  - 0 Support Surface(s) Assessment (bed, cushion, seat, etc.) INTERVENTIONS - Miscellaneous '[]'$  - 0 External ear exam '[]'$  - 0 Patient Transfer (multiple staff / Civil Service fast streamer / Similar devices) '[]'$  - 0 Simple Staple / Suture removal (25 or less) '[]'$  - 0 Complex Staple / Suture removal (26 or more) '[]'$  -  0 Hypo/Hyperglycemic Management (do not check if billed separately) X- 1 15 Ankle / Brachial Index (ABI) - do not check if billed separately Has the patient been seen at the hospital within the last three years: Yes Total Score: 110 Level Of Care: New/Established -

## 2020-02-22 ENCOUNTER — Other Ambulatory Visit: Payer: Self-pay

## 2020-02-22 ENCOUNTER — Encounter (HOSPITAL_BASED_OUTPATIENT_CLINIC_OR_DEPARTMENT_OTHER): Payer: Medicaid Other | Attending: Internal Medicine | Admitting: Internal Medicine

## 2020-02-22 DIAGNOSIS — I890 Lymphedema, not elsewhere classified: Secondary | ICD-10-CM | POA: Diagnosis not present

## 2020-02-22 DIAGNOSIS — L97222 Non-pressure chronic ulcer of left calf with fat layer exposed: Secondary | ICD-10-CM | POA: Insufficient documentation

## 2020-02-22 DIAGNOSIS — M1990 Unspecified osteoarthritis, unspecified site: Secondary | ICD-10-CM | POA: Diagnosis not present

## 2020-02-22 DIAGNOSIS — I87312 Chronic venous hypertension (idiopathic) with ulcer of left lower extremity: Secondary | ICD-10-CM | POA: Insufficient documentation

## 2020-02-22 DIAGNOSIS — I10 Essential (primary) hypertension: Secondary | ICD-10-CM | POA: Diagnosis not present

## 2020-02-22 DIAGNOSIS — Z7984 Long term (current) use of oral hypoglycemic drugs: Secondary | ICD-10-CM | POA: Insufficient documentation

## 2020-02-22 DIAGNOSIS — E11621 Type 2 diabetes mellitus with foot ulcer: Secondary | ICD-10-CM | POA: Insufficient documentation

## 2020-02-23 NOTE — Progress Notes (Signed)
HANALEI, GLACE ([id number]) Visit Report for 02/22/2020 Debridement Details Patient Name: Date of Service: [name], [place] RIA 02/22/2020 2:45 PM Medical Record Number: [id number] Patient Account Number: [id number] Date of Birth/Sex: Treating RN: Nov 24, 1955 (64 y.o. [name] Primary Care Provider: Rosita Fire D Other Clinician: Referring Provider: Treating Provider/Extender: [employer] Weeks in Treatment: 1 Debridement Performed for Assessment: Wound #1 Left,Medial Lower Leg Performed By: Physician Ricard Dillon., MD Debridement Type: Debridement Level of Consciousness (Pre-procedure): Awake and Alert Pre-procedure Verification/Time Out Yes - 16:25 Taken: Start Time: 16:25 T Area Debrided (L x W): otal 1.3 (cm) x 1.5 (cm) = 1.95 (cm) Tissue and other material debrided: Viable, Non-Viable, Slough, Subcutaneous, Slough Level: Skin/Subcutaneous Tissue Debridement Description: Excisional Instrument: Curette Bleeding: Minimum Hemostasis Achieved: Pressure End Time: 16:26 Procedural Pain: 0 Post Procedural Pain: 0 Response to Treatment: Procedure was tolerated well Level of Consciousness (Post- Awake and Alert procedure): Post Debridement Measurements of Total Wound Length: (cm) 1.3 Width: (cm) 1.5 Depth: (cm) 0.3 Volume: (cm) 0.459 Character of Wound/Ulcer Post Debridement: Requires Further Debridement Post Procedure Diagnosis Same as Pre-procedure Electronic Signature(s) Signed: 02/22/2020 5:47:00 PM By: Linton Ham MD Signed: 02/23/2020 4:42:40 PM By: Levan Hurst RN, BSN Entered By: Linton Ham on 02/22/2020 17:15:58 -------------------------------------------------------------------------------- HPI Details Patient Name: Date of Service: [name], [place] 02/22/2020 2:45 PM Medical Record Number: [id number] Patient Account Number: [id number] Date of Birth/Sex: Treating RN: February 22, 1956 (64 y.o. [name] Primary Care  Provider: Rosita Fire D Other Clinician: Referring Provider: Treating Provider/Extender: [employer] Weeks in Treatment: 1 History of Present Illness HPI Description: ADMISSION 02/15/2020 This is a 64 year old woman who is a type II diabetic on oral agents. She states that 3 months ago a friend's cat scratched her on the medial left lower extremity. This opened into a wound she has not been able to get this to close. She has largely been using topical antibiotic ointments of various description over-the-counter. She saw Dr. Legrand Rams who is her primary doctor on 6/10 she was given a course of doxycycline which she is finished. Past medical history includes knee pain bilaterally secondary to osteoarthritis, hypertension, type 2 diabetes, probable diabetic neuropathy ABI was 0.96 on the left 7/1; this is a patient with lymphedema. This was initially a cat scratch injury. She came into the clinic last time with a punched out area on the left medial lower leg. Aggressive debridement last week we have been using Iodoflex. Electronic Signature(s) Signed: 02/22/2020 5:47:00 PM By: Linton Ham MD Entered By: Linton Ham on 02/22/2020 17:16:38 -------------------------------------------------------------------------------- Physical Exam Details Patient Name: Date of Service: [name], [place] RIA 02/22/2020 2:45 PM Medical Record Number: [id number] Patient Account Number: [id number] Date of Birth/Sex: Treating RN: [birth year] (64 y.o. [name] Primary Care Provider: Rosita Fire D Other Clinician: Referring Provider: Treating Provider/Extender: [employer] Weeks in Treatment: 1 Cardiovascular Pedal pulses are palpable. Edema present in both extremities. We still have edema in the left leg. Notes Wound exam; hearing questions on the left medial lower extremity. Better looking surface this week but continued debridement of necrotic debris  including eschar subcutaneous tissue over the wound surface. Hemostasis with direct pressure. There is no evidence of surrounding infection Electronic Signature(s) Signed: 02/22/2020 5:47:00 PM By: Linton Ham MD Entered By: Linton Ham on 02/22/2020 17:17:47 -------------------------------------------------------------------------------- Physician Orders Details Patient Name: Date of Service: [name], [place] 02/22/2020 2:45 PM Medical Record Number: [id number] Patient Account Number: [id number]  Date of Birth/Sex: Treating RN: 14-Jan-1956 (64 y.o. [name] Primary Care Provider: Rosita Fire D Other Clinician: Referring Provider: Treating Provider/Extender: [employer] Weeks in Treatment: 1 Verbal / Phone Orders: No Diagnosis Coding ICD-10 Coding Code Description [phone] Non-pressure chronic ulcer of left calf with fat layer exposed I87.312 Chronic venous hypertension (idiopathic) with ulcer of left lower extremity I89.0 Lymphedema, not elsewhere classified Follow-up Appointments Return Appointment in 1 week. Dressing Change Frequency Wound #1 Left,Medial Lower Leg Do not change entire dressing for one week. Skin Barriers/Peri-Wound Care Moisturizing lotion Wound Cleansing May shower with protection. - use cast protector for showering and protection of wrap. Primary Wound Dressing Wound #1 Left,Medial Lower Leg Iodoflex Secondary Dressing Dry Gauze ABD pad Edema Control 3 Layer Compression System - Left Lower Extremity Avoid standing for long periods of time Elevate legs to the level of the heart or above for 30 minutes daily and/or when sitting, a frequency of: - throughout the day Exercise regularly Electronic Signature(s) Signed: 02/22/2020 5:47:00 PM By: Linton Ham MD Signed: 02/23/2020 4:42:40 PM By: Levan Hurst RN, BSN Entered By: Levan Hurst on 02/22/2020  16:27:37 -------------------------------------------------------------------------------- Problem List Details Patient Name: Date of Service: [name], [place] 02/22/2020 2:45 PM Medical Record Number: [id number] Patient Account Number: [id number] Date of Birth/Sex: Treating RN: 1955/10/06 (64 y.o. [name] Primary Care Provider: Rosita Fire D Other Clinician: Referring Provider: Treating Provider/Extender: [employer] Weeks in Treatment: 1 Active Problems ICD-10 Encounter Code Description Active Date MDM Diagnosis [phone] Non-pressure chronic ulcer of left calf with fat layer exposed 02/15/2020 No Yes I87.312 Chronic venous hypertension (idiopathic) with ulcer of left lower extremity 02/15/2020 No Yes I89.0 Lymphedema, not elsewhere classified 02/15/2020 No Yes Inactive Problems Resolved Problems Electronic Signature(s) Signed: 02/22/2020 5:47:00 PM By: Linton Ham MD Entered By: Linton Ham on 02/22/2020 17:15:41 -------------------------------------------------------------------------------- Progress Note Details Patient Name: Date of Service: [name], [place] 02/22/2020 2:45 PM Medical Record Number: [id number] Patient Account Number: [id number] Date of Birth/Sex: Treating RN: 1955/10/30 (63 y.o. [name] Primary Care Provider: Rosita Fire D Other Clinician: Referring Provider: Treating Provider/Extender: [employer] Weeks in Treatment: 1 Subjective History of Present Illness (HPI) ADMISSION 02/15/2020 This is a 64 year old woman who is a type II diabetic on oral agents. She states that 3 months ago a friend's cat scratched her on the medial left lower extremity. This opened into a wound she has not been able to get this to close. She has largely been using topical antibiotic ointments of various description over-the-counter. She saw Dr. Legrand Rams who is her primary doctor on 6/10 she was given a course of  doxycycline which she is finished. Past medical history includes knee pain bilaterally secondary to osteoarthritis, hypertension, type 2 diabetes, probable diabetic neuropathy ABI was 0.96 on the left 7/1; this is a patient with lymphedema. This was initially a cat scratch injury. She came into the clinic last time with a punched out area on the left medial lower leg. Aggressive debridement last week we have been using Iodoflex. Objective Constitutional Vitals Time Taken: 3:42 PM, Height: 64 in, Source: Stated, Weight: 197 lbs, Source: Stated, BMI: 33.8, Temperature: 98.3 F, Pulse: 96 bpm, Respiratory Rate: 18 breaths/min, Blood Pressure: 146/82 mmHg. Cardiovascular Pedal pulses are palpable. Edema present in both extremities. We still have edema in the left leg. General Notes: Wound exam; hearing questions on the left medial lower extremity. Better looking surface this week but  continued debridement of necrotic debris including eschar subcutaneous tissue over the wound surface. Hemostasis with direct pressure. There is no evidence of surrounding infection Integumentary (Hair, Skin) Wound #1 status is Open. Original cause of wound was Trauma. The wound is located on the Left,Medial Lower Leg. The wound measures 1.3cm length x 1.5cm width x 0.3cm depth; 1.532cm^2 area and 0.459cm^3 volume. There is Fat Layer (Subcutaneous Tissue) Exposed exposed. There is no tunneling or undermining noted. There is a small amount of serosanguineous drainage noted. The wound margin is distinct with the outline attached to the wound base. There is small (1-33%) red granulation within the wound bed. There is a large (67-100%) amount of necrotic tissue within the wound bed including Eschar and Adherent Slough. Assessment Active Problems ICD-10 Non-pressure chronic ulcer of left calf with fat layer exposed Chronic venous hypertension (idiopathic) with ulcer of left lower extremity Lymphedema, not elsewhere  classified Procedures Wound #1 Pre-procedure diagnosis of Wound #1 is a Trauma, Other located on the Left,Medial Lower Leg . There was a Excisional Skin/Subcutaneous Tissue Debridement with a total area of 1.95 sq cm performed by Ricard Dillon., MD. With the following instrument(s): Curette to remove Viable and Non-Viable tissue/material. Material removed includes Subcutaneous Tissue and Slough and. No specimens were taken. A time out was conducted at 16:25, prior to the start of the procedure. A Minimum amount of bleeding was controlled with Pressure. The procedure was tolerated well with a pain level of 0 throughout and a pain level of 0 following the procedure. Post Debridement Measurements: 1.3cm length x 1.5cm width x 0.3cm depth; 0.459cm^3 volume. Character of Wound/Ulcer Post Debridement requires further debridement. Post procedure Diagnosis Wound #1: Same as Pre-Procedure Pre-procedure diagnosis of Wound #1 is a Trauma, Other located on the Left,Medial Lower Leg . There was a Three Layer Compression Therapy Procedure by Levan Hurst, RN. Post procedure Diagnosis Wound #1: Same as Pre-Procedure Plan Follow-up Appointments: Return Appointment in 1 week. Dressing Change Frequency: Wound #1 Left,Medial Lower Leg: Do not change entire dressing for one week. Skin Barriers/Peri-Wound Care: Moisturizing lotion Wound Cleansing: May shower with protection. - use cast protector for showering and protection of wrap. Primary Wound Dressing: Wound #1 Left,Medial Lower Leg: Iodoflex Secondary Dressing: Dry Gauze ABD pad Edema Control: 3 Layer Compression System - Left Lower Extremity Avoid standing for long periods of time Elevate legs to the level of the heart or above for 30 minutes daily and/or when sitting, a frequency of: - throughout the day Exercise regularly 1. Continue with Iodoflex again for ongoing debridement of the wound surface 2. As soon as a surface will allow likely  a collagen-based dressing. Still under 3 layer compression Electronic Signature(s) Signed: 02/22/2020 5:47:00 PM By: Linton Ham MD Entered By: Linton Ham on 02/22/2020 17:18:28 -------------------------------------------------------------------------------- SuperBill Details Patient Name: Date of Service: [name], [place] RIA 02/22/2020 Medical Record Number: [id number] Patient Account Number: [id number] Date of Birth/Sex: Treating RN: [birth year] (63 y.o. [name] Primary Care Provider: Rosita Fire D Other Clinician: Referring Provider: Treating Provider/Extender: [employer] Weeks in Treatment: 1 Diagnosis Coding ICD-10 Codes Code Description [phone] Non-pressure chronic ulcer of left calf with fat layer exposed I87.312 Chronic venous hypertension (idiopathic) with ulcer of left lower extremity I89.0 Lymphedema, not elsewhere classified Facility Procedures CPT4 Code: [id number] I87 Description: 11042 - DEB SUBQ TISSUE 20 SQ CM/< ICD-10 Diagnosis Description L97.222 Non-pressure chronic ulcer of left calf with fat layer exposed .312 Chronic venous hypertension (idiopathic) with  ulcer of left lower extremity Modifier: Quantity: 1 Physician Procedures : CPT4 Code Description Modifier [id number] - WC PHYS SUBQ TISS 20 SQ CM ICD-10 Diagnosis Description L97.222 Non-pressure chronic ulcer of left calf with fat layer exposed I87.312 Chronic venous hypertension (idiopathic) with ulcer of left lower  extremity Quantity: 1 Electronic Signature(s) Signed: 02/22/2020 5:47:00 PM By: Linton Ham MD Entered By: Linton Ham on 02/22/2020 17:18:43

## 2020-02-23 NOTE — Progress Notes (Signed)
Patient Account Number: [id number] Date of Birth/Sex: Treating RN: [birth year] (64 y.o. Maria Murphy Maria Care [name]: Maria Murphy Other Clinician: Referring Maria Murphy: Maria Murphy Weeks in Treatment: 1 Active Problems Location of Pain Severity and Description of Pain Patient Has Paino Yes Site Locations Pain Location: Pain in Ulcers With Dressing Change: Yes Rate the pain. Current Pain Level: 2 Worst Pain Level: 10 Least Pain Level: 0 Character of Pain Describe the Pain: Tender, Other: stinging Pain Management and Medication Current Pain Management: Medication: Yes Is the Current Pain Management Adequate: Adequate How does your wound impact your activities of daily livingo Sleep: No Bathing: No Appetite: No Relationship With Others: No Bladder Continence: No Emotions: No Bowel Continence: No Work: No Toileting: No Drive: No Dressing: No Hobbies: No Electronic Signature(s) Signed: 02/22/2020 6:06:33 PM By: Maria Gouty RN, Murphy Entered By: Maria Murphy on 02/22/2020 15:50:13 -------------------------------------------------------------------------------- Patient/Caregiver Education Details Patient Name: Date of Service: Maria Murphy 7/1/2021andnbsp2:45 PM Medical Record Number: [id number] Patient Account Number: [id number] Date of Birth/Gender: Treating RN: 1956/08/24 (64 y.o. Maria Murphy Maria Care Physician: Maria Murphy Other Clinician: Referring Physician: Treating Physician/Extender: Maria Murphy Weeks in Treatment: 1 Education Assessment Education Provided To: Patient Education Topics Provided Wound/Skin Impairment: Methods: Explain/Verbal Responses: State content correctly Electronic Signature(s) Signed: 02/23/2020 4:42:40 PM By: Maria Hurst RN, Murphy Entered By: Maria Murphy on 02/22/2020 17:58:17 -------------------------------------------------------------------------------- Wound Assessment Details Patient Name: Date of Service: Maria Murphy, Maria Murphy 02/22/2020 2:45 PM Medical Record Number: [id number] Patient Account Number: [id number] Date of Birth/Sex: Treating RN: [birth year] (64 y.o. Maria Murphy Maria Murphy: Maria Murphy Other Clinician: Referring Maria Murphy: Maria Maria Murphy/Extender: Maria Murphy Weeks in Treatment: 1 Wound Status Wound Number: 1 Maria Etiology: Trauma, Other Wound Location: Left, Medial Lower Leg Wound Status: Open Wounding Event: Trauma Comorbid History: Arrhythmia, Hypertension Date Acquired: 11/02/2019 Weeks Of Treatment: 1 Clustered Wound: No Photos Photo Uploaded By: Maria Murphy on 02/23/2020 11:56:26 Wound Measurements Length: (cm) 1.3 Width: (cm) 1.5 Depth: (cm) 0.3 Area: (cm) 1.532 Volume: (cm) 0.459 % Reduction in Area: -8.3% % Reduction in Volume: -225.5% Epithelialization: None Tunneling: No Undermining: No Wound Description Classification: Full Thickness Without Exposed Support Structu Wound Margin: Distinct, outline attached Exudate Amount: Small Exudate Type: Serosanguineous Exudate Color: red, brown res Foul Odor After Cleansing: No Slough/Fibrino No Wound Bed Granulation Amount: Small (1-33%) Exposed Structure Granulation Quality: Red Fascia Exposed: No Necrotic Amount: Large (67-100%) Fat Layer (Subcutaneous Tissue) Exposed: Yes Necrotic Quality: Eschar, Adherent Slough Tendon Exposed: No Muscle Exposed: No Joint Exposed: No Bone Exposed: No Treatment Notes Wound #1 (Left, Medial Lower Leg) 2. Periwound Care Moisturizing lotion 3. Maria Dressing Applied Iodoflex 4. Secondary Dressing Dry Gauze 6. Support Layer Applied 3 layer compression Water quality scientist) Signed: 02/22/2020 6:06:33 PM By: Maria Gouty RN, Murphy Entered By: Maria Murphy on 02/22/2020 15:53:10 -------------------------------------------------------------------------------- Vitals Details Patient Name: Date of  Service: Maria Murphy, Maria Murphy 02/22/2020 2:45 PM Medical Record Number: [id number] Patient Account Number: [id number] Date of Birth/Sex: Treating RN: 12-02-1955 (64 y.o. Maria Murphy Maria Care Bren Borys: Maria Murphy Other Clinician: Referring Maria Murphy: Maria Maria Murphy/Extender: Maria Murphy Weeks in Treatment: 1 Vital Signs Time Taken: 15:42 Temperature (F): 98.3 Height (in): 64 Pulse (bpm): 96 Source: Stated Respiratory Rate (breaths/min): 18 Weight (lbs): 197 Blood Pressure (mmHg): 146/82 Source: Stated Reference Range: 80 - 120 mg / dl Body Mass Index (  Maria Murphy, Maria Murphy ([id number]) Visit Report for 02/22/2020 Arrival Information Details Patient Name: Date of Service: Maria Murphy, Maria Murphy 02/22/2020 2:45 PM Medical Record Number: [id number] Patient Account Number: [id number] Date of Birth/Sex: Treating RN: [birth year] (64 y.o. Maria Murphy Maria Care Ellakate Gonsalves: Maria Murphy Other Clinician: Referring Maria Murphy: Maria Maria Murphy/Extender: Maria Murphy Weeks in Treatment: 1 Visit Information History Since Last Visit Added or deleted any medications: No Patient Arrived: Maria Murphy Any new allergies or adverse reactions: No Arrival Time: 15:37 Had a fall or experienced change in No Accompanied By: self activities of daily living that may affect Transfer Assistance: None risk of falls: Patient Identification Verified: Yes Signs or symptoms of abuse/neglect since last visito No Secondary Verification Process Completed: Yes Hospitalized since last visit: No Patient Requires Transmission-Based Precautions: No Implantable device outside of the clinic excluding No Patient Has Alerts: Yes cellular tissue based products placed in the center since last visit: Has Dressing in Place as Prescribed: Yes Has Compression in Place as Prescribed: Yes Pain Present Now: Yes Electronic Signature(s) Signed: 02/22/2020 6:06:33 PM By: Maria Murphy Entered By: Maria Murphy on 02/22/2020 15:40:24 -------------------------------------------------------------------------------- Compression Therapy Details Patient Name: Date of Service: Maria Murphy 02/22/2020 2:45 PM Medical Record Number: [id number] Patient Account Number: [id number] Date of Birth/Sex: Treating RN: Sep 19, 1955 (63 y.o. Maria Murphy Maria Care Sinia Antosh: Maria Murphy Other Clinician: Referring Tao Satz: Maria Duilio Heritage/Extender: Maria Murphy Weeks in Treatment: 1 Compression Therapy Performed for Wound Assessment: Wound #1  Left,Medial Lower Leg Performed By: Clinician Maria Abts, RN Compression Type: Three Layer Post Procedure Diagnosis Same as Pre-procedure Electronic Signature(s) Signed: 02/23/2020 4:42:40 PM By: Maria Abts RN, Murphy Entered By: Maria Murphy on 02/22/2020 16:26:48 -------------------------------------------------------------------------------- Encounter Discharge Information Details Patient Name: Date of Service: Maria Murphy 02/22/2020 2:45 PM Medical Record Number: [id number] Patient Account Number: [id number] Date of Birth/Sex: Treating RN: 1955/12/03 (64 y.o. Maria Murphy Maria Care Maui Ahart: Maria Murphy Other Clinician: Referring Torryn Hudspeth: Maria Masiel Gentzler/Extender: Maria Murphy Weeks in Treatment: 1 Encounter Discharge Information Items Post Procedure Vitals Discharge Condition: Stable Temperature (F): 98.3 Ambulatory Status: Cane Pulse (bpm): 96 Discharge Destination: Home Respiratory Rate (breaths/min): 18 Transportation: Private Auto Blood Pressure (mmHg): 146/82 Accompanied By: self Schedule Follow-up Appointment: Yes Clinical Summary of Care: Patient Declined Electronic Signature(s) Signed: 02/22/2020 6:06:33 PM By: Maria Murphy Entered By: Maria Murphy on 02/22/2020 17:06:43 -------------------------------------------------------------------------------- Lower Extremity Assessment Details Patient Name: Date of Service: Maria Murphy 02/22/2020 2:45 PM Medical Record Number: [id number] Patient Account Number: [id number] Date of Birth/Sex: Treating RN: May 26, 1956 (64 y.o. Maria Murphy Maria Care Revella Shelton: Maria Murphy Other Clinician: Referring Yuto Cajuste: Maria Champagne Paletta/Extender: Maria Murphy Weeks in Treatment: 1 Edema Assessment Assessed: [Left: No] [Right: No] Edema: [Left: Ye] [Right: s] Calf Left: Right: Point of Measurement: 29 cm From Medial Instep 39.8 cm  cm Ankle Left: Right: Point of Measurement: 10 cm From Medial Instep 24.1 cm cm Vascular Assessment Pulses: Dorsalis Pedis Palpable: [Left:Yes] Electronic Signature(s) Signed: 02/22/2020 6:06:33 PM By: Maria Murphy Entered By: Maria Murphy on 02/22/2020 15:50:45 -------------------------------------------------------------------------------- Multi Wound Chart Details Patient Name: Date of Service: Maria Murphy 02/22/2020 2:45 PM Medical Record Number: [id number] Patient Account Number: [id number] Date of Birth/Sex: Treating RN: 29-May-1956 (63 y.o. Maria Murphy Maria Care Arleigh Dicola: Maria Murphy Other Clinician: Referring Aswad Wandrey: Maria Andee Chivers/Extender: Maria Murphy Weeks in Treatment:  Maria Murphy, Maria Murphy ([id number]) Visit Report for 02/22/2020 Arrival Information Details Patient Name: Date of Service: Maria Murphy, Maria Murphy 02/22/2020 2:45 PM Medical Record Number: [id number] Patient Account Number: [id number] Date of Birth/Sex: Treating RN: [birth year] (64 y.o. Maria Murphy Maria Care Ellakate Gonsalves: Maria Murphy Other Clinician: Referring Maria Murphy: Maria Maria Murphy/Extender: Maria Murphy Weeks in Treatment: 1 Visit Information History Since Last Visit Added or deleted any medications: No Patient Arrived: Maria Murphy Any new allergies or adverse reactions: No Arrival Time: 15:37 Had a fall or experienced change in No Accompanied By: self activities of daily living that may affect Transfer Assistance: None risk of falls: Patient Identification Verified: Yes Signs or symptoms of abuse/neglect since last visito No Secondary Verification Process Completed: Yes Hospitalized since last visit: No Patient Requires Transmission-Based Precautions: No Implantable device outside of the clinic excluding No Patient Has Alerts: Yes cellular tissue based products placed in the center since last visit: Has Dressing in Place as Prescribed: Yes Has Compression in Place as Prescribed: Yes Pain Present Now: Yes Electronic Signature(s) Signed: 02/22/2020 6:06:33 PM By: Maria Murphy Entered By: Maria Murphy on 02/22/2020 15:40:24 -------------------------------------------------------------------------------- Compression Therapy Details Patient Name: Date of Service: Maria Murphy 02/22/2020 2:45 PM Medical Record Number: [id number] Patient Account Number: [id number] Date of Birth/Sex: Treating RN: Sep 19, 1955 (63 y.o. Maria Murphy Maria Care Sinia Antosh: Maria Murphy Other Clinician: Referring Tao Satz: Maria Duilio Heritage/Extender: Maria Murphy Weeks in Treatment: 1 Compression Therapy Performed for Wound Assessment: Wound #1  Left,Medial Lower Leg Performed By: Clinician Maria Abts, RN Compression Type: Three Layer Post Procedure Diagnosis Same as Pre-procedure Electronic Signature(s) Signed: 02/23/2020 4:42:40 PM By: Maria Abts RN, Murphy Entered By: Maria Murphy on 02/22/2020 16:26:48 -------------------------------------------------------------------------------- Encounter Discharge Information Details Patient Name: Date of Service: Maria Murphy 02/22/2020 2:45 PM Medical Record Number: [id number] Patient Account Number: [id number] Date of Birth/Sex: Treating RN: 1955/12/03 (64 y.o. Maria Murphy Maria Care Maui Ahart: Maria Murphy Other Clinician: Referring Torryn Hudspeth: Maria Masiel Gentzler/Extender: Maria Murphy Weeks in Treatment: 1 Encounter Discharge Information Items Post Procedure Vitals Discharge Condition: Stable Temperature (F): 98.3 Ambulatory Status: Cane Pulse (bpm): 96 Discharge Destination: Home Respiratory Rate (breaths/min): 18 Transportation: Private Auto Blood Pressure (mmHg): 146/82 Accompanied By: self Schedule Follow-up Appointment: Yes Clinical Summary of Care: Patient Declined Electronic Signature(s) Signed: 02/22/2020 6:06:33 PM By: Maria Murphy Entered By: Maria Murphy on 02/22/2020 17:06:43 -------------------------------------------------------------------------------- Lower Extremity Assessment Details Patient Name: Date of Service: Maria Murphy 02/22/2020 2:45 PM Medical Record Number: [id number] Patient Account Number: [id number] Date of Birth/Sex: Treating RN: May 26, 1956 (64 y.o. Maria Murphy Maria Care Revella Shelton: Maria Murphy Other Clinician: Referring Yuto Cajuste: Maria Champagne Paletta/Extender: Maria Murphy Weeks in Treatment: 1 Edema Assessment Assessed: [Left: No] [Right: No] Edema: [Left: Ye] [Right: s] Calf Left: Right: Point of Measurement: 29 cm From Medial Instep 39.8 cm  cm Ankle Left: Right: Point of Measurement: 10 cm From Medial Instep 24.1 cm cm Vascular Assessment Pulses: Dorsalis Pedis Palpable: [Left:Yes] Electronic Signature(s) Signed: 02/22/2020 6:06:33 PM By: Maria Murphy Entered By: Maria Murphy on 02/22/2020 15:50:45 -------------------------------------------------------------------------------- Multi Wound Chart Details Patient Name: Date of Service: Maria Murphy 02/22/2020 2:45 PM Medical Record Number: [id number] Patient Account Number: [id number] Date of Birth/Sex: Treating RN: 29-May-1956 (63 y.o. Maria Murphy Maria Care Arleigh Dicola: Maria Murphy Other Clinician: Referring Aswad Wandrey: Maria Andee Chivers/Extender: Maria Murphy Weeks in Treatment:  1 Vital Signs Height(in): 64 Pulse(bpm): 96 Weight(lbs): 197 Blood Pressure(mmHg): 146/82 Body Mass Index(BMI): 34 Temperature(F): 98.3 Respiratory Rate(breaths/min): 18 Photos: [1:No Photos Left, Medial Lower Leg] [N/A:N/A N/A] Wound Location: [1:Trauma] [N/A:N/A] Wounding Event: [1:Trauma, Other] [N/A:N/A] Maria Etiology: [1:Arrhythmia, Hypertension] [N/A:N/A] Comorbid History: [1:11/02/2019] [N/A:N/A] Date Acquired: [1:1] [N/A:N/A] Weeks of Treatment: [1:Open] [N/A:N/A] Wound Status: [1:1.3x1.5x0.3] [N/A:N/A] Measurements L x W x Murphy (cm) [1:1.532] [N/A:N/A] A (cm) : rea [1:0.459] [N/A:N/A] Volume (cm) : [1:-8.30%] [N/A:N/A] % Reduction in A rea: [1:-225.50%] [N/A:N/A] % Reduction in Volume: [1:Full Thickness Without Exposed] [N/A:N/A] Classification: [1:Support Structures Small] [N/A:N/A] Exudate A mount: [1:Serosanguineous] [N/A:N/A] Exudate Type: [1:red, brown] [N/A:N/A] Exudate Color: [1:Distinct, outline attached] [N/A:N/A] Wound Margin: [1:Small (1-33%)] [N/A:N/A] Granulation A mount: [1:Red] [N/A:N/A] Granulation Quality: [1:Large (67-100%)] [N/A:N/A] Necrotic A mount: [1:Eschar, Adherent Slough] [N/A:N/A] Necrotic  Tissue: [1:Fat Layer (Subcutaneous Tissue)] [N/A:N/A] Exposed Structures: [1:Exposed: Yes Fascia: No Tendon: No Muscle: No Joint: No Bone: No None] [N/A:N/A] Epithelialization: [1:Debridement - Excisional] [N/A:N/A] Debridement: Pre-procedure Verification/Time Out 16:25 [N/A:N/A] Taken: [1:Subcutaneous, Slough] [N/A:N/A] Tissue Debrided: [1:Skin/Subcutaneous Tissue] [N/A:N/A] Level: [1:1.95] [N/A:N/A] Debridement A (sq cm): [1:rea Curette] [N/A:N/A] Instrument: [1:Minimum] [N/A:N/A] Bleeding: [1:Pressure] [N/A:N/A] Hemostasis A chieved: [1:0] [N/A:N/A] Procedural Pain: [1:0] [N/A:N/A] Post Procedural Pain: [1:Procedure was tolerated well] [N/A:N/A] Debridement Treatment Response: [1:1.3x1.5x0.3] [N/A:N/A] Post Debridement Measurements L x W x Murphy (cm) [1:0.459] [N/A:N/A] Post Debridement Volume: (cm) [1:Compression Therapy] [N/A:N/A] Procedures Performed: [1:Debridement] Treatment Notes Wound #1 (Left, Medial Lower Leg) 2. Periwound Care Moisturizing lotion 3. Maria Dressing Applied Iodoflex 4. Secondary Dressing Dry Gauze 6. Support Layer Applied 3 layer compression Water quality scientist) Signed: 02/22/2020 5:47:00 PM By: Linton Ham MD Signed: 02/23/2020 4:42:40 PM By: Maria Hurst RN, Murphy Entered By: Linton Ham on 02/22/2020 17:15:47 -------------------------------------------------------------------------------- Multi-Disciplinary Care Plan Details Patient Name: Date of Service: Maria Murphy, [place] 02/22/2020 2:45 PM Medical Record Number: [id number] Patient Account Number: [id number] Date of Birth/Sex: Treating RN: 10-11-1955 (64 y.o. Maria Murphy Maria Care Charika Mikelson: Maria Murphy Other Clinician: Referring Brooklin Rieger: Maria Eboni Coval/Extender: Maria Murphy Weeks in Treatment: 1 Active Inactive Orientation to the Wound Care Program Nursing Diagnoses: Knowledge deficit related to the wound healing center  program Goals: Patient/caregiver will verbalize understanding of the [facility] Date Initiated: 02/15/2020 Target Resolution Date: 03/01/2020 Goal Status: Active Interventions: Provide education on orientation to the wound center Notes: Pain, Acute or Chronic Nursing Diagnoses: Pain, acute or chronic: actual or potential Potential alteration in comfort, pain Goals: Patient will verbalize adequate pain control and receive pain control interventions during procedures as needed Date Initiated: 02/15/2020 Target Resolution Date: 02/23/2020 Goal Status: Active Patient/caregiver will verbalize comfort level met Date Initiated: 02/15/2020 Target Resolution Date: 02/23/2020 Goal Status: Active Interventions: Complete pain assessment as per visit requirements Provide education on pain management Treatment Activities: Administer pain control measures as ordered : 02/15/2020 Notes: Wound/Skin Impairment Nursing Diagnoses: Knowledge deficit related to ulceration/compromised skin integrity Goals: Patient/caregiver will verbalize understanding of skin care regimen Date Initiated: 02/15/2020 Target Resolution Date: 03/01/2020 Goal Status: Active Interventions: Assess patient/caregiver ability to perform ulcer/skin care regimen upon admission and as needed Assess ulceration(s) every visit Provide education on ulcer and skin care Treatment Activities: Skin care regimen initiated : 02/15/2020 Topical wound management initiated : 02/15/2020 Notes: Electronic Signature(s) Signed: 02/23/2020 4:42:40 PM By: Maria Hurst RN, Murphy Entered By: Maria Murphy on 02/22/2020 17:58:08 -------------------------------------------------------------------------------- Pain Assessment Details Patient Name: Date of Service: Maria Murphy, [place] 02/22/2020 2:45 PM Medical Record Number: [id number]

## 2020-02-29 ENCOUNTER — Encounter (HOSPITAL_BASED_OUTPATIENT_CLINIC_OR_DEPARTMENT_OTHER): Payer: Medicaid Other | Admitting: Internal Medicine

## 2020-02-29 DIAGNOSIS — E11621 Type 2 diabetes mellitus with foot ulcer: Secondary | ICD-10-CM | POA: Diagnosis not present

## 2020-03-05 ENCOUNTER — Ambulatory Visit: Payer: Medicaid Other | Admitting: Orthopedic Surgery

## 2020-03-05 NOTE — Progress Notes (Addendum)
Maria Murphy, Maria Murphy ([id number]) Visit Report for 02/29/2020 Debridement Details Patient Name: Date of Service: Maria Murphy, [place] RIA 02/29/2020 9:15 A M Medical Record Number: [id number] Patient Account Number: [id number] Date of Birth/Sex: Treating RN: 21-Aug-1956 (64 y.o. Female) Maria Murphy Primary Care Provider: Rosita Fire Murphy Other Clinician: Referring Provider: Treating Provider/Extender: Maria Murphy Weeks in Treatment: 2 Debridement Performed for Assessment: Wound #1 Left,Medial Lower Leg Performed By: Physician Maria Murphy., MD Debridement Type: Debridement Level of Consciousness (Pre-procedure): Awake and Alert Pre-procedure Verification/Time Out Yes - 10:01 Taken: Start Time: 10:01 Pain Control: Other : Benzocaine 20% T Area Debrided (L x W): otal 1.2 (cm) x 1.5 (cm) = 1.8 (cm) Tissue and other material debrided: Viable, Non-Viable, Slough, Subcutaneous, Slough Level: Skin/Subcutaneous Tissue Debridement Description: Excisional Instrument: Curette Bleeding: Minimum Hemostasis Achieved: Pressure End Time: 10:02 Procedural Pain: 4 Post Procedural Pain: 2 Response to Treatment: Procedure was tolerated well Level of Consciousness (Post- Awake and Alert procedure): Post Debridement Measurements of Total Wound Length: (cm) 1.2 Width: (cm) 1.5 Depth: (cm) 0.3 Volume: (cm) 0.424 Character of Wound/Ulcer Post Debridement: Requires Further Debridement Post Procedure Diagnosis Same as Pre-procedure Electronic Signature(s) Signed: 02/29/2020 5:28:43 PM By: Maria Ham MD Signed: 03/04/2020 5:04:17 PM By: Maria Hurst RN, BSN Entered By: Maria Murphy on 02/29/2020 10:04:26 -------------------------------------------------------------------------------- HPI Details Patient Name: Date of Service: Maria Murphy, [place] RIA 02/29/2020 9:15 A M Medical Record Number: [id number] Patient Account Number: [id number] Date of Birth/Sex: Treating RN: 04-07-1956 (64  y.o. Female) Maria Murphy Primary Care Provider: Peterson Murphy Other Clinician: Referring Provider: Treating Provider/Extender: Maria Murphy Weeks in Treatment: 2 History of Present Illness HPI Description: ADMISSION 02/15/2020 This is a 64 year old woman who is a type II diabetic on oral agents. She states that 3 months ago a friend's cat scratched her on the medial left lower extremity. This opened into a wound she has not been able to get this to close. She has largely been using topical antibiotic ointments of various description over-the-counter. She saw Dr. Legrand Rams who is her primary doctor on 6/10 she was given a course of doxycycline which she is finished. Past medical history includes knee pain bilaterally secondary to osteoarthritis, hypertension, type 2 diabetes, probable diabetic neuropathy ABI was 0.96 on the left 7/1; this is a patient with lymphedema. This was initially a cat scratch injury. She came into the clinic last time with a punched out area on the left medial lower leg. Aggressive debridement last week we have been using Iodoflex. 7/8; using Iodoflex under compression. Still adherent debris requiring debridement I changed to [employer]) Signed: 02/29/2020 5:28:43 PM By: Maria Ham MD Entered By: Maria Murphy on 02/29/2020 10:05:08 -------------------------------------------------------------------------------- Physical Exam Details Patient Name: Date of Service: Maria Murphy, [place] RIA 02/29/2020 9:15 A M Medical Record Number: [id number] Patient Account Number: [id number] Date of Birth/Sex: Treating RN: [birth year] (64 y.o. Female) Maria Murphy Primary Care Provider: Peterson Murphy Other Clinician: Referring Provider: Treating Provider/Extender: Maria Murphy Weeks in Treatment: 2 Constitutional Sitting or standing Blood Pressure is within target range for patient.. Pulse regular and within target  range for patient.Marland Kitchen Respirations regular, non-labored and within target range.. Temperature is normal and within the target range for the patient.Marland Kitchen Appears in no distress. Notes Wound exam; the area in question is on the left medial lower extremity. Circular wound with depth. Still requiring debridement of very adherent necrotic debris using a #3 curette. Overall there is less of  this and it certainly cleans up after debridement. Hemostasis with direct pressure no evidence of surrounding infection Electronic Signature(s) Signed: 02/29/2020 5:28:43 PM By: Maria Ham MD Entered By: Maria Murphy on 02/29/2020 10:06:36 -------------------------------------------------------------------------------- Physician Orders Details Patient Name: Date of Service: Maria Murphy, [place] RIA 02/29/2020 9:15 A M Medical Record Number: [id number] Patient Account Number: [id number] Date of Birth/Sex: Treating RN: 23-Oct-1955 (64 y.o. Female) Maria Murphy Primary Care Provider: Rosita Fire Murphy Other Clinician: Referring Provider: Treating Provider/Extender: Maria Murphy Weeks in Treatment: 2 Verbal / Phone Orders: No Diagnosis Coding ICD-10 Coding Code Description [phone] Non-pressure chronic ulcer of left calf with fat layer exposed I87.312 Chronic venous hypertension (idiopathic) with ulcer of left lower extremity I89.0 Lymphedema, not elsewhere classified Follow-up Appointments ppointment in 2 weeks. - MD visit Return A Nurse Visit: - 1 week for rewrap Dressing Change Frequency Wound #1 Left,Medial Lower Leg Do not change entire dressing for one week. Skin Barriers/Peri-Wound Care Moisturizing lotion Wound Cleansing May shower with protection. - use cast protector for showering and protection of wrap. Primary Wound Dressing Wound #1 Left,Medial Lower Leg Cutimed Sorbact - thin layer of hydrogel over sorbact Secondary Dressing Wound #1 Left,Medial Lower Leg Dry Gauze ABD  pad Edema Control 3 Layer Compression System - Left Lower Extremity Avoid standing for long periods of time Elevate legs to the level of the heart or above for 30 minutes daily and/or when sitting, a frequency of: - throughout the day Exercise regularly Electronic Signature(s) Signed: 02/29/2020 5:28:43 PM By: Maria Ham MD Signed: 03/04/2020 5:04:17 PM By: Maria Hurst RN, BSN Entered By: Maria Murphy on 02/29/2020 10:03:42 -------------------------------------------------------------------------------- Problem List Details Patient Name: Date of Service: Maria Murphy, [place] RIA 02/29/2020 9:15 A M Medical Record Number: [id number] Patient Account Number: [id number] Date of Birth/Sex: Treating RN: 1955-10-21 (64 y.o. Female) Maria Murphy Primary Care Provider: Rosita Fire Murphy Other Clinician: Referring Provider: Treating Provider/Extender: Maria Murphy Weeks in Treatment: 2 Active Problems ICD-10 Encounter Code Description Active Date MDM Diagnosis [phone] Non-pressure chronic ulcer of left calf with fat layer exposed 02/15/2020 No Yes I87.312 Chronic venous hypertension (idiopathic) with ulcer of left lower extremity 02/15/2020 No Yes I89.0 Lymphedema, not elsewhere classified 02/15/2020 No Yes Inactive Problems Resolved Problems Electronic Signature(s) Signed: 02/29/2020 5:28:43 PM By: Maria Ham MD Entered By: Maria Murphy on 02/29/2020 10:03:54 -------------------------------------------------------------------------------- Progress Note Details Patient Name: Date of Service: Maria Mirza, [place] RIA 02/29/2020 9:15 A M Medical Record Number: [id number] Patient Account Number: [id number] Date of Birth/Sex: Treating RN: [birth year] (64 y.o. Female) Maria Murphy Primary Care Provider: Peterson Murphy Other Clinician: Referring Provider: Treating Provider/Extender: Maria Murphy Weeks in Treatment: 2 Subjective History of Present  Illness (HPI) ADMISSION 02/15/2020 This is a 64 year old woman who is a type II diabetic on oral agents. She states that 3 months ago a friend's cat scratched her on the medial left lower extremity. This opened into a wound she has not been able to get this to close. She has largely been using topical antibiotic ointments of various description over-the-counter. She saw Dr. Legrand Rams who is her primary doctor on 6/10 she was given a course of doxycycline which she is finished. Past medical history includes knee pain bilaterally secondary to osteoarthritis, hypertension, type 2 diabetes, probable diabetic neuropathy ABI was 0.96 on the left 7/1; this is a patient with lymphedema. This was initially a cat scratch injury. She came into the clinic last time with a punched out  area on the left medial lower leg. Aggressive debridement last week we have been using Iodoflex. 7/8; using Iodoflex under compression. Still adherent debris requiring debridement I changed to Sorbact Objective Constitutional Sitting or standing Blood Pressure is within target range for patient.. Pulse regular and within target range for patient.Marland Kitchen Respirations regular, non-labored and within target range.. Temperature is normal and within the target range for the patient.Marland Kitchen Appears in no distress. Vitals Time Taken: 9:09 AM, Height: 64 in, Weight: 197 lbs, BMI: 33.8, Temperature: 98.0 F, Pulse: 58 bpm, Respiratory Rate: 18 breaths/min, Blood Pressure: 126/79 mmHg. General Notes: Wound exam; the area in question is on the left medial lower extremity. Circular wound with depth. Still requiring debridement of very adherent necrotic debris using a #3 curette. Overall there is less of this and it certainly cleans up after debridement. Hemostasis with direct pressure no evidence of surrounding infection Integumentary (Hair, Skin) Wound #1 status is Open. Original cause of wound was Trauma. The wound is located on the Left,Medial Lower  Leg. The wound measures 1.2cm length x 1.5cm width x 0.3cm depth; 1.414cm^2 area and 0.424cm^3 volume. There is Fat Layer (Subcutaneous Tissue) Exposed exposed. There is no tunneling or undermining noted. There is a small amount of serosanguineous drainage noted. The wound margin is distinct with the outline attached to the wound base. There is medium (34-66%) red granulation within the wound bed. There is a medium (34-66%) amount of necrotic tissue within the wound bed including Adherent Slough. Assessment Active Problems ICD-10 Non-pressure chronic ulcer of left calf with fat layer exposed Chronic venous hypertension (idiopathic) with ulcer of left lower extremity Lymphedema, not elsewhere classified Procedures Wound #1 Pre-procedure diagnosis of Wound #1 is a Trauma, Other located on the Left,Medial Lower Leg . There was a Excisional Skin/Subcutaneous Tissue Debridement with a total area of 1.8 sq cm performed by Maria Murphy., MD. With the following instrument(s): Curette to remove Viable and Non-Viable tissue/material. Material removed includes Subcutaneous Tissue and Slough and after achieving pain control using Other (Benzocaine 20%). No specimens were taken. A time out was conducted at 10:01, prior to the start of the procedure. A Minimum amount of bleeding was controlled with Pressure. The procedure was tolerated well with a pain level of 4 throughout and a pain level of 2 following the procedure. Post Debridement Measurements: 1.2cm length x 1.5cm width x 0.3cm depth; 0.424cm^3 volume. Character of Wound/Ulcer Post Debridement requires further debridement. Post procedure Diagnosis Wound #1: Same as Pre-Procedure Pre-procedure diagnosis of Wound #1 is a Trauma, Other located on the Left,Medial Lower Leg . There was a Three Layer Compression Therapy Procedure by Maria Hurst, RN. Post procedure Diagnosis Wound #1: Same as Pre-Procedure Plan Follow-up Appointments: Return  Appointment in 2 weeks. - MD visit Nurse Visit: - 1 week for rewrap Dressing Change Frequency: Wound #1 Left,Medial Lower Leg: Do not change entire dressing for one week. Skin Barriers/Peri-Wound Care: Moisturizing lotion Wound Cleansing: May shower with protection. - use cast protector for showering and protection of wrap. Primary Wound Dressing: Wound #1 Left,Medial Lower Leg: Cutimed Sorbact - thin layer of hydrogel over sorbact Secondary Dressing: Wound #1 Left,Medial Lower Leg: Dry Gauze ABD pad Edema Control: 3 Layer Compression System - Left Lower Extremity Avoid standing for long periods of time Elevate legs to the level of the heart or above for 30 minutes daily and/or when sitting, a frequency of: - throughout the day Exercise regularly 1. I change the primary dressing to Sorbac to  see if we can get rid of the last vestiges of the debris than I am having to do mechanical debridement on 2. Collagen-based dressing hopefully by the next time I see her Electronic Signature(s) Signed: 02/29/2020 5:28:43 PM By: Maria Ham MD Entered By: Maria Murphy on 02/29/2020 10:07:53 -------------------------------------------------------------------------------- SuperBill Details Patient Name: Date of Service: Maria Murphy, [place] RIA 02/29/2020 Medical Record Number: [id number] Patient Account Number: [id number] Date of Birth/Sex: Treating RN: 1955/11/18 (64 y.o. Female) Maria Murphy Primary Care Provider: Rosita Fire Murphy Other Clinician: Referring Provider: Treating Provider/Extender: Maria Murphy Weeks in Treatment: 2 Diagnosis Coding ICD-10 Codes Code Description [phone] Non-pressure chronic ulcer of left calf with fat layer exposed I87.312 Chronic venous hypertension (idiopathic) with ulcer of left lower extremity I89.0 Lymphedema, not elsewhere classified Facility Procedures CPT4 Code: [id number] Description: 61443 - DEB SUBQ TISSUE 20 SQ CM/< ICD-10  Diagnosis Description L97.222 Non-pressure chronic ulcer of left calf with fat layer exposed Modifier: Quantity: 1 Physician Procedures : CPT4 Code Description Modifier [id number] - WC PHYS SUBQ TISS 20 SQ CM ICD-10 Diagnosis Description L97.222 Non-pressure chronic ulcer of left calf with fat layer exposed Quantity: 1 Electronic Signature(s) Signed: 02/29/2020 5:28:43 PM By: Maria Ham MD Entered By: Maria Murphy on 02/29/2020 10:08:14

## 2020-03-05 NOTE — Progress Notes (Addendum)
Weight(lbs): 197 Blood Pressure(mmHg): 126/79 Body Mass Index(BMI): 34 Temperature(F): 98.0 Respiratory Rate(breaths/min): 18 Photos: [1:No Photos Left, Medial Lower Leg] [N/A:N/A N/A] Wound Location: [1:Trauma] [N/A:N/A] Wounding Event: [1:Trauma, Other] [N/A:N/A] Primary Etiology: [1:Arrhythmia, Hypertension] [N/A:N/A] Comorbid History: [1:11/02/2019] [N/A:N/A] Date Acquired: [1:2] [N/A:N/A] Weeks of Treatment: [1:Open] [N/A:N/A] Wound Status: [1:1.2x1.5x0.3] [N/A:N/A] Measurements L x W x D (cm) [1:1.414] [N/A:N/A] A (cm) : rea [1:0.424] [N/A:N/A] Volume (cm) : [1:0.00%] [N/A:N/A] % Reduction in A rea: [1:-200.70%] [N/A:N/A] % Reduction in Volume: [1:Full Thickness Without Exposed] [N/A:N/A] Classification: [1:Support Structures Small] [N/A:N/A] Exudate A mount: [1:Serosanguineous] [N/A:N/A] Exudate Type: [1:red, brown] [N/A:N/A] Exudate Color: [1:Distinct, outline attached] [N/A:N/A] Wound Margin: [1:Medium (34-66%)] [N/A:N/A] Granulation A mount: [1:Red] [N/A:N/A] Granulation Quality: [1:Medium (34-66%)] [N/A:N/A] Necrotic A mount: [1:Fat Layer (Subcutaneous Tissue)] [N/A:N/A] Exposed Structures: [1:Exposed: Yes  Fascia: No Tendon: No Muscle: No Joint: No Bone: No None] [N/A:N/A] Epithelialization: [1:Debridement - Excisional] [N/A:N/A] Debridement: Pre-procedure Verification/Time Out 10:01 [N/A:N/A] Taken: [1:Other] [N/A:N/A] Pain Control: [1:Subcutaneous, Slough] [N/A:N/A] Tissue Debrided: [1:Skin/Subcutaneous Tissue] [N/A:N/A] Level: [1:1.8] [N/A:N/A] Debridement A (sq cm): [1:rea Curette] [N/A:N/A] Instrument: [1:Minimum] [N/A:N/A] Bleeding: [1:Pressure] [N/A:N/A] Hemostasis A chieved: [1:4] [N/A:N/A] Procedural Pain: [1:2] [N/A:N/A] Post Procedural Pain: [1:Procedure was tolerated well] [N/A:N/A] Debridement Treatment Response: [1:1.2x1.5x0.3] [N/A:N/A] Post Debridement Measurements L x W x D (cm) [1:0.424] [N/A:N/A] Post Debridement Volume: (cm) [1:Compression Therapy] [N/A:N/A] Procedures Performed: [1:Debridement] Treatment Notes Electronic Signature(s) Signed: 02/29/2020 5:28:43 PM By: Linton Ham MD Signed: 03/04/2020 5:04:17 PM By: Levan Hurst RN, BSN Entered By: Linton Ham on 02/29/2020 10:04:10 -------------------------------------------------------------------------------- Multi-Disciplinary Care Plan Details Patient Name: Date of Service: [name], [place] RIA 02/29/2020 9:15 A M Medical Record Number: [id number] Patient Account Number: [id number] Date of Birth/Sex: Treating RN: September 07, 1955 (64 y.o. Female) Levan Hurst Primary Care Antoniette Peake: Rosita Fire D Other Clinician: Referring Yizel Canby: Treating Hennessey Cantrell/Extender: [employer] Weeks in Treatment: 2 Active Inactive Wound/Skin Impairment Nursing Diagnoses: Knowledge deficit related to ulceration/compromised skin integrity Goals: Patient/caregiver will verbalize understanding of skin care regimen Date Initiated: 02/15/2020 Target Resolution Date: 03/15/2020 Goal Status: Active Interventions: Assess patient/caregiver ability to perform ulcer/skin care regimen upon admission and as  needed Assess ulceration(s) every visit Provide education on ulcer and skin care Treatment Activities: Skin care regimen initiated : 02/15/2020 Topical wound management initiated : 02/15/2020 Notes: Electronic Signature(s) Signed: 03/04/2020 5:04:17 PM By: Levan Hurst RN, BSN Entered By: Levan Hurst on 02/29/2020 09:59:17 -------------------------------------------------------------------------------- Pain Assessment Details Patient Name: Date of Service: [name], [place] RIA 02/29/2020 9:15 A M Medical Record Number: [id number] Patient Account Number: [id number] Date of Birth/Sex: Treating RN: 1956/04/10 (64 y.o. Female) Levan Hurst Primary Care Donja Tipping: Rosita Fire D Other Clinician: Referring Micayla Brathwaite: Treating Bueford Arp/Extender: [employer] Weeks in Treatment: 2 Active Problems Location of Pain Severity and Description of Pain Patient Has Paino No Site Locations Pain Management and Medication Current Pain Management: Electronic Signature(s) Signed: 03/01/2020 2:13:12 PM By: Sandre Kitty Signed: 03/04/2020 5:04:17 PM By: Levan Hurst RN, BSN Entered By: Sandre Kitty on 02/29/2020 09:09:49 -------------------------------------------------------------------------------- Patient/Caregiver Education Details Patient Name: Date of Service: [name], [place] RIA 7/8/2021andnbsp9:15 [place] Record Number: [id number] Patient Account Number: [id number] Date of Birth/Gender: Treating RN: 03/11/1956 (64 y.o. Female) Levan Hurst Primary Care Physician: Peterson Ao Other Clinician: Referring Physician: Treating Physician/Extender: Tomasa Hosteller Weeks in Treatment: 2 Education Assessment Education Provided To: Patient Education Topics Provided Wound/Skin Impairment: Methods: Explain/Verbal Responses: State content correctly Electronic Signature(s) Signed: 03/04/2020 5:04:17 PM By: Levan Hurst RN, BSN  SHANYCE, DARIS ([id number]) Visit Report for 02/29/2020 Arrival Information Details Patient Name: Date of Service: [name], [place] RIA 02/29/2020 9:15 A M Medical Record Number: [id number] Patient Account Number: [id number] Date of Birth/Sex: Treating RN: 09/10/1955 (64 y.o. Female) Levan Hurst Primary Care Kaelob Persky: Rosita Fire D Other Clinician: Referring Kelliann Pendergraph: Treating Kimbery Harwood/Extender: Tomasa Hosteller Weeks in Treatment: 2 Visit Information History Since Last Visit Added or deleted any medications: No Patient Arrived: [name] Any new allergies or adverse reactions: No Arrival Time: 09:09 Had a fall or experienced change in No Accompanied By: self activities of daily living that may affect Transfer Assistance: None risk of falls: Patient Identification Verified: Yes Signs or symptoms of abuse/neglect since last visito No Secondary Verification Process Completed: Yes Hospitalized since last visit: No Patient Requires Transmission-Based Precautions: No Implantable device outside of the clinic excluding No Patient Has Alerts: Yes cellular tissue based products placed in the center since last visit: Has Dressing in Place as Prescribed: Yes Pain Present Now: No Electronic Signature(s) Signed: 03/01/2020 2:13:12 PM By: Sandre Kitty Entered By: Sandre Kitty on 02/29/2020 09:09:29 -------------------------------------------------------------------------------- Compression Therapy Details Patient Name: Date of Service: [name], [place] RIA 02/29/2020 9:15 A M Medical Record Number: [id number] Patient Account Number: [id number] Date of Birth/Sex: Treating RN: Dec 30, 1955 (64 y.o. Female) Levan Hurst Primary Care Catheleen Langhorne: Peterson Ao Other Clinician: Referring Caydan Mctavish: Treating Miniya Miguez/Extender: [employer] Weeks in Treatment: 2 Compression Therapy Performed for Wound Assessment: Wound #1 Left,Medial Lower Leg Performed By:  Clinician Levan Hurst, RN Compression Type: Three Layer Post Procedure Diagnosis Same as Pre-procedure Electronic Signature(s) Signed: 03/04/2020 5:04:17 PM By: Levan Hurst RN, BSN Entered By: Levan Hurst on 02/29/2020 10:02:08 -------------------------------------------------------------------------------- Encounter Discharge Information Details Patient Name: Date of Service: [name], [place] RIA 02/29/2020 9:15 A M Medical Record Number: [id number] Patient Account Number: [id number] Date of Birth/Sex: Treating RN: [birth year] (64 y.o. Female) Carlene Coria Primary Care Ayleen Mckinstry: Rosita Fire D Other Clinician: Referring Rossy Virag: Treating Marshia Tropea/Extender: [employer] Weeks in Treatment: 2 Encounter Discharge Information Items Post Procedure Vitals Discharge Condition: Stable Temperature (F): 98 Ambulatory Status: Cane Pulse (bpm): 58 Discharge Destination: Home Respiratory Rate (breaths/min): 18 Transportation: Private Auto Blood Pressure (mmHg): 126/79 Accompanied By: self Schedule Follow-up Appointment: Yes Clinical Summary of Care: Patient Declined Electronic Signature(s) Signed: 03/04/2020 5:13:16 PM By: Carlene Coria RN Entered By: Carlene Coria on 02/29/2020 10:17:49 -------------------------------------------------------------------------------- Lower Extremity Assessment Details Patient Name: Date of Service: [name], [place] RIA 02/29/2020 9:15 A M Medical Record Number: [id number] Patient Account Number: [id number] Date of Birth/Sex: Treating RN: 11/29/1955 (64 y.o. Female) Kela Millin Primary Care Rosio Weiss: Rosita Fire D Other Clinician: Referring Lakota Markgraf: Treating Aldridge Krzyzanowski/Extender: [employer] Weeks in Treatment: 2 Edema Assessment Assessed: [Left: No] [Right: No] Edema: [Left: Ye] [Right: s] Calf Left: Right: Point of Measurement: 29 cm From Medial Instep 39.8 cm cm Ankle Left: Right: Point of  Measurement: 10 cm From Medial Instep 24.1 cm cm Vascular Assessment Pulses: Dorsalis Pedis Palpable: [Left:Yes] Electronic Signature(s) Signed: 02/29/2020 5:25:24 PM By: Kela Millin Entered By: Kela Millin on 02/29/2020 09:23:11 -------------------------------------------------------------------------------- Multi Wound Chart Details Patient Name: Date of Service: [name], [place] RIA 02/29/2020 9:15 A M Medical Record Number: [id number] Patient Account Number: [id number] Date of Birth/Sex: Treating RN: 21-Apr-1956 (64 y.o. Female) Levan Hurst Primary Care Rasheida Broden: Peterson Ao Other Clinician: Referring Anelis Hrivnak: Treating Tennie Grussing/Extender: [employer] Weeks in Treatment: 2 Vital Signs Height(in): 64 Pulse(bpm): 51  Weight(lbs): 197 Blood Pressure(mmHg): 126/79 Body Mass Index(BMI): 34 Temperature(F): 98.0 Respiratory Rate(breaths/min): 18 Photos: [1:No Photos Left, Medial Lower Leg] [N/A:N/A N/A] Wound Location: [1:Trauma] [N/A:N/A] Wounding Event: [1:Trauma, Other] [N/A:N/A] Primary Etiology: [1:Arrhythmia, Hypertension] [N/A:N/A] Comorbid History: [1:11/02/2019] [N/A:N/A] Date Acquired: [1:2] [N/A:N/A] Weeks of Treatment: [1:Open] [N/A:N/A] Wound Status: [1:1.2x1.5x0.3] [N/A:N/A] Measurements L x W x D (cm) [1:1.414] [N/A:N/A] A (cm) : rea [1:0.424] [N/A:N/A] Volume (cm) : [1:0.00%] [N/A:N/A] % Reduction in A rea: [1:-200.70%] [N/A:N/A] % Reduction in Volume: [1:Full Thickness Without Exposed] [N/A:N/A] Classification: [1:Support Structures Small] [N/A:N/A] Exudate A mount: [1:Serosanguineous] [N/A:N/A] Exudate Type: [1:red, brown] [N/A:N/A] Exudate Color: [1:Distinct, outline attached] [N/A:N/A] Wound Margin: [1:Medium (34-66%)] [N/A:N/A] Granulation A mount: [1:Red] [N/A:N/A] Granulation Quality: [1:Medium (34-66%)] [N/A:N/A] Necrotic A mount: [1:Fat Layer (Subcutaneous Tissue)] [N/A:N/A] Exposed Structures: [1:Exposed: Yes  Fascia: No Tendon: No Muscle: No Joint: No Bone: No None] [N/A:N/A] Epithelialization: [1:Debridement - Excisional] [N/A:N/A] Debridement: Pre-procedure Verification/Time Out 10:01 [N/A:N/A] Taken: [1:Other] [N/A:N/A] Pain Control: [1:Subcutaneous, Slough] [N/A:N/A] Tissue Debrided: [1:Skin/Subcutaneous Tissue] [N/A:N/A] Level: [1:1.8] [N/A:N/A] Debridement A (sq cm): [1:rea Curette] [N/A:N/A] Instrument: [1:Minimum] [N/A:N/A] Bleeding: [1:Pressure] [N/A:N/A] Hemostasis A chieved: [1:4] [N/A:N/A] Procedural Pain: [1:2] [N/A:N/A] Post Procedural Pain: [1:Procedure was tolerated well] [N/A:N/A] Debridement Treatment Response: [1:1.2x1.5x0.3] [N/A:N/A] Post Debridement Measurements L x W x D (cm) [1:0.424] [N/A:N/A] Post Debridement Volume: (cm) [1:Compression Therapy] [N/A:N/A] Procedures Performed: [1:Debridement] Treatment Notes Electronic Signature(s) Signed: 02/29/2020 5:28:43 PM By: Linton Ham MD Signed: 03/04/2020 5:04:17 PM By: Levan Hurst RN, BSN Entered By: Linton Ham on 02/29/2020 10:04:10 -------------------------------------------------------------------------------- Multi-Disciplinary Care Plan Details Patient Name: Date of Service: [name], [place] RIA 02/29/2020 9:15 A M Medical Record Number: [id number] Patient Account Number: [id number] Date of Birth/Sex: Treating RN: September 07, 1955 (64 y.o. Female) Levan Hurst Primary Care Antoniette Peake: Rosita Fire D Other Clinician: Referring Yizel Canby: Treating Hennessey Cantrell/Extender: [employer] Weeks in Treatment: 2 Active Inactive Wound/Skin Impairment Nursing Diagnoses: Knowledge deficit related to ulceration/compromised skin integrity Goals: Patient/caregiver will verbalize understanding of skin care regimen Date Initiated: 02/15/2020 Target Resolution Date: 03/15/2020 Goal Status: Active Interventions: Assess patient/caregiver ability to perform ulcer/skin care regimen upon admission and as  needed Assess ulceration(s) every visit Provide education on ulcer and skin care Treatment Activities: Skin care regimen initiated : 02/15/2020 Topical wound management initiated : 02/15/2020 Notes: Electronic Signature(s) Signed: 03/04/2020 5:04:17 PM By: Levan Hurst RN, BSN Entered By: Levan Hurst on 02/29/2020 09:59:17 -------------------------------------------------------------------------------- Pain Assessment Details Patient Name: Date of Service: [name], [place] RIA 02/29/2020 9:15 A M Medical Record Number: [id number] Patient Account Number: [id number] Date of Birth/Sex: Treating RN: 1956/04/10 (64 y.o. Female) Levan Hurst Primary Care Donja Tipping: Rosita Fire D Other Clinician: Referring Micayla Brathwaite: Treating Bueford Arp/Extender: [employer] Weeks in Treatment: 2 Active Problems Location of Pain Severity and Description of Pain Patient Has Paino No Site Locations Pain Management and Medication Current Pain Management: Electronic Signature(s) Signed: 03/01/2020 2:13:12 PM By: Sandre Kitty Signed: 03/04/2020 5:04:17 PM By: Levan Hurst RN, BSN Entered By: Sandre Kitty on 02/29/2020 09:09:49 -------------------------------------------------------------------------------- Patient/Caregiver Education Details Patient Name: Date of Service: [name], [place] RIA 7/8/2021andnbsp9:15 [facility] Record Number: [id number] Patient Account Number: [id number] Date of Birth/Gender: Treating RN: 03/11/1956 (64 y.o. Female) Levan Hurst Primary Care Physician: Peterson Ao Other Clinician: Referring Physician: Treating Physician/Extender: Tomasa Hosteller Weeks in Treatment: 2 Education Assessment Education Provided To: Patient Education Topics Provided Wound/Skin Impairment: Methods: Explain/Verbal Responses: State content correctly Electronic Signature(s) Signed: 03/04/2020 5:04:17 PM By: Levan Hurst RN, BSN

## 2020-03-07 ENCOUNTER — Encounter (HOSPITAL_BASED_OUTPATIENT_CLINIC_OR_DEPARTMENT_OTHER): Payer: Medicaid Other | Admitting: Internal Medicine

## 2020-03-08 ENCOUNTER — Encounter (HOSPITAL_BASED_OUTPATIENT_CLINIC_OR_DEPARTMENT_OTHER): Payer: Medicaid Other | Admitting: Internal Medicine

## 2020-03-08 ENCOUNTER — Other Ambulatory Visit: Payer: Self-pay

## 2020-03-08 DIAGNOSIS — E11621 Type 2 diabetes mellitus with foot ulcer: Secondary | ICD-10-CM | POA: Diagnosis not present

## 2020-03-11 NOTE — Progress Notes (Signed)
RENAI, RHYNER ([id number]) Visit Report for 03/08/2020 Arrival Information Details Patient Name: Date of Service: Maria Murphy, Maria Murphy RIA 03/08/2020 9:00 A M Medical Record Number: [id number] Patient Account Number: [id number] Date of Birth/Sex: Treating RN: 06-May-1956 (64 y.o. Maria Murphy Primary Care Maria Murphy: Maria Murphy Other Clinician: Referring Maria Murphy: Treating Maria Murphy/Extender: Maria Murphy in Treatment: 3 Visit Information History Since Last Visit Added or deleted any medications: No Patient Arrived: Maria Murphy Any new allergies or adverse reactions: No Arrival Time: 09:04 Had a fall or experienced change in No Accompanied By: self activities of daily living that may affect Transfer Assistance: None risk of falls: Patient Identification Verified: Yes Signs or symptoms of abuse/neglect since last visito No Secondary Verification Process Completed: Yes Hospitalized since last visit: No Patient Requires Transmission-Based Precautions: No Implantable device outside of the clinic excluding No Patient Has Alerts: Yes cellular tissue based products placed in the center since last visit: Has Dressing in Place as Prescribed: Yes Pain Present Now: No Electronic Signature(s) Signed: 03/11/2020 4:00:56 PM By: Maria Murphy Entered By: Maria Murphy on 03/08/2020 09:05:15 -------------------------------------------------------------------------------- Compression Therapy Details Patient Name: Date of Service: Maria Heck, [place] RIA 03/08/2020 9:00 A M Medical Record Number: [id number] Patient Account Number: [id number] Date of Birth/Sex: Treating RN: February 16, 1956 (64 y.o. Maria Murphy Primary Care Maria Murphy: Maria Murphy Other Clinician: Referring Varnell Orvis: Treating Nehal Witting/Extender: Harmon Dun Murphy Murphy in Treatment: 3 Compression Therapy Performed for Wound Assessment: Wound #1 Left,Medial Lower Leg Performed By:  Clinician Maria Deed, RN Compression Type: Three Emergency planning/management officer) Signed: 03/08/2020 4:41:06 PM By: Maria Deed RN, BSN Entered By: Maria Murphy on 03/08/2020 09:11:18 -------------------------------------------------------------------------------- Encounter Discharge Information Details Patient Name: Date of Service: Maria Heck, [place] RIA 03/08/2020 9:00 A M Medical Record Number: [id number] Patient Account Number: [id number] Date of Birth/Sex: Treating RN: 12/11/1955 (64 y.o. Maria Murphy Primary Care Ulla Mckiernan: Other Clinician: Avon Gully Murphy Referring Britini Garcilazo: Treating Gad Aymond/Extender: Maria Murphy in Treatment: 3 Encounter Discharge Information Items Discharge Condition: Stable Ambulatory Status: Cane Discharge Destination: Home Transportation: Private Auto Accompanied By: self Schedule Follow-up Appointment: Yes Clinical Summary of Care: Patient Declined Electronic Signature(s) Signed: 03/08/2020 4:41:06 PM By: Maria Deed RN, BSN Entered By: Maria Murphy on 03/08/2020 09:12:34 -------------------------------------------------------------------------------- Patient/Caregiver Education Details Patient Name: Date of Service: Maria Heck, [place] RIA 7/16/2021andnbsp9:00 A M Medical Record Number: [id number] Patient Account Number: [id number] Date of Birth/Gender: Treating RN: [birth year] (64 y.o. Maria Murphy Primary Care Physician: Maria Murphy Other Clinician: Referring Physician: Treating Physician/Extender: Maria Murphy in Treatment: 3 Education Assessment Education Provided To: Patient Education Topics Provided Venous: Methods: Explain/Verbal Responses: Reinforcements needed, State content correctly Wound/Skin Impairment: Methods: Explain/Verbal Responses: Reinforcements needed, State content correctly Electronic Signature(s) Signed: 03/08/2020 4:41:06 PM By: Maria Deed RN, BSN Entered By: Maria Murphy on 03/08/2020 09:12:16 -------------------------------------------------------------------------------- Wound Assessment Details Patient Name: Date of Service: Maria Heck, [place] RIA 03/08/2020 9:00 A M Medical Record Number: [id number] Patient Account Number: [id number] Date of Birth/Sex: Treating RN: 01/21/1956 (64 y.o. Maria Murphy Primary Care Sheli Dorin: Maria Murphy Other Clinician: Referring Elmus Mathes: Treating Aireal Slater/Extender: Harmon Dun Murphy Murphy in Treatment: 3 Wound Status Wound Number: 1 Primary Etiology: Trauma, Other Wound Location: Left, Medial Lower Leg Wound Status: Open Wounding Event: Trauma Comorbid History: Arrhythmia, Hypertension Date Acquired: 11/02/2019 Murphy Of Treatment: 3 Clustered Wound: No Wound Measurements Length: (cm) 1.2 Width: (cm) 1.5 Depth: (cm) 0.3 Area: (cm)  1.414 Volume: (cm) 0.424 % Reduction in Area: 0% % Reduction in Volume: -200.7% Epithelialization: None Tunneling: No Undermining: No Wound Description Classification: Full Thickness Without Exposed Support Structures Wound Margin: Distinct, outline attached Exudate Amount: Small Exudate Type: Serosanguineous Exudate Color: red, brown Foul Odor After Cleansing: No Slough/Fibrino Yes Wound Bed Granulation Amount: Large (67-100%) Exposed Structure Granulation Quality: Red Fascia Exposed: No Necrotic Amount: Small (1-33%) Fat Layer (Subcutaneous Tissue) Exposed: Yes Necrotic Quality: Adherent Slough Tendon Exposed: No Muscle Exposed: No Joint Exposed: No Bone Exposed: No Treatment Notes Wound #1 (Left, Medial Lower Leg) 2. Periwound Care Moisturizing lotion 3. Primary Dressing Applied Hydrogel or K-Y Jelly Other primary dressing (specifiy in notes) 4. Secondary Dressing Dry Gauze 6. Support Layer Applied 3 layer compression wrap Notes sorbact, netting Electronic Signature(s) Signed: 03/08/2020  4:41:06 PM By: Maria Deed RN, BSN Signed: 03/08/2020 4:43:21 PM By: Cherylin Mylar Entered By: Maria Murphy on 03/08/2020 09:10:53 -------------------------------------------------------------------------------- Vitals Details Patient Name: Date of Service: Maria Heck, [place] RIA 03/08/2020 9:00 A M Medical Record Number: [id number] Patient Account Number: [id number] Date of Birth/Sex: Treating RN: 02/06/1956 (64 y.o. Maria Murphy Primary Care Braydon Kullman: Maria Murphy Other Clinician: Referring Brynnly Bonet: Treating Kawanda Drumheller/Extender: Harmon Dun Murphy Murphy in Treatment: 3 Vital Signs Time Taken: 09:05 Temperature (F): 98.1 Height (in): 64 Pulse (bpm): 54 Weight (lbs): 197 Respiratory Rate (breaths/min): 18 Body Mass Index (BMI): 33.8 Blood Pressure (mmHg): 131/57 Reference Range: 80 - 120 mg / dl Electronic Signature(s) Signed: 03/11/2020 4:00:56 PM By: Maria Murphy Entered By: Maria Murphy on 03/08/2020 09:07:56

## 2020-03-11 NOTE — Progress Notes (Signed)
GENEE, RANN ([id number]) Visit Report for 03/08/2020 SuperBill Details Patient Name: Date of Service: [name], [place] RIA 03/08/2020 Medical Record Number: [id number] Patient Account Number: [id number] Date of Birth/Sex: Treating RN: Dec 25, 1955 (64 y.o. 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

## 2020-03-12 ENCOUNTER — Other Ambulatory Visit: Payer: Self-pay

## 2020-03-12 ENCOUNTER — Ambulatory Visit (HOSPITAL_COMMUNITY)
Admission: RE | Admit: 2020-03-12 | Discharge: 2020-03-12 | Disposition: A | Discharge status: Home / Self Care | Payer: Medicaid Other | Source: Ambulatory Visit | Attending: Internal Medicine | Admitting: Internal Medicine

## 2020-03-12 DIAGNOSIS — R921 Mammographic calcification found on diagnostic imaging of breast: Secondary | ICD-10-CM

## 2020-03-12 DIAGNOSIS — R928 Other abnormal and inconclusive findings on diagnostic imaging of breast: Secondary | ICD-10-CM | POA: Diagnosis not present

## 2020-03-14 ENCOUNTER — Other Ambulatory Visit: Payer: Self-pay

## 2020-03-14 ENCOUNTER — Encounter (HOSPITAL_BASED_OUTPATIENT_CLINIC_OR_DEPARTMENT_OTHER): Payer: Medicaid Other | Admitting: Internal Medicine

## 2020-03-14 DIAGNOSIS — E11621 Type 2 diabetes mellitus with foot ulcer: Secondary | ICD-10-CM | POA: Diagnosis not present

## 2020-03-14 NOTE — Progress Notes (Signed)
[name], [name] ([id number]) Visit Report for 03/14/2020 HPI Details Patient Name: Date of Service: Maria Murphy, [place] RIA 03/14/2020 9:00 [place] Record Number: [id number] Patient Account Number: [id number] Date of Birth/Sex: Treating RN: Jan 16, 1956 (64 y.o. [name] Primary Care Provider: Rosita Fire D Other Clinician: Referring Provider: Treating Provider/Extender: [employer] Weeks in Treatment: 4 History of Present Illness HPI Description: ADMISSION 02/15/2020 This is a 64 year old woman who is a type II diabetic on oral agents. She states that 3 months ago a friend's cat scratched her on the medial left lower extremity. This opened into a wound she has not been able to get this to close. She has largely been using topical antibiotic ointments of various description over-the-counter. She saw Dr. Legrand Rams who is her primary doctor on 6/10 she was given a course of doxycycline which she is finished. Past medical history includes knee pain bilaterally secondary to osteoarthritis, hypertension, type 2 diabetes, probable diabetic neuropathy ABI was 0.96 on the left 7/1; this is a patient with lymphedema. This was initially a cat scratch injury. She came into the clinic last time with a punched out area on the left medial lower leg. Aggressive debridement last week we have been using Iodoflex. 7/8; using Iodoflex under compression. Still adherent debris requiring debridement I changed to Sorbact 7/22 using Sorbact under compression. The wound is measuring smaller and the surface looks a lot better. My plan is been to change to collagen today however there was such an improvement I continued with the Sorbact Electronic Signature(s) Signed: 03/14/2020 5:29:29 PM By: Linton Ham MD Entered By: Linton Ham on 03/14/2020 09:36:12 -------------------------------------------------------------------------------- Physical Exam Details Patient Name: Date of  Service: Maria Murphy, [place] RIA 03/14/2020 9:00 [place] Number: [id number] Patient Account Number: [id number] Date of Birth/Sex: Treating RN: 1956/05/21 (64 y.o. [name] Primary Care Provider: Rosita Fire D Other Clinician: Referring Provider: Treating Provider/Extender: [employer] Weeks in Treatment: 4 Constitutional Sitting or standing Blood Pressure is within target range for patient.. Pulse regular and within target range for patient.Marland Kitchen Respirations regular, non-labored and within target range.. Temperature is normal and within the target range for the patient.Marland Kitchen Appears in no distress. Respiratory work of breathing is normal. Cardiovascular Pedal pulses are palpable on the left. Good edema control. Notes Wound exam; the area in question is on the left medial lower extremity. Circular wound. This appears to have filled in nicely not nearly the same depth and surface area is improved. Under illumination the surface of the wound looks healthy. No mechanical debridement was felt to be necessary. The wound was debrided with wound cleanser and gauze. No evidence of surrounding infection Electronic Signature(s) Signed: 03/14/2020 5:29:29 PM By: Linton Ham MD Entered By: Linton Ham on 03/14/2020 09:37:42 -------------------------------------------------------------------------------- Physician Orders Details Patient Name: Date of Service: Maria Murphy, Uncertain RIA 03/14/2020 9:00 [place] Number: [id number] Patient Account Number: [id number] Date of Birth/Sex: Treating RN: [birth year] (64 y.o. [name] Primary Care Provider: Rosita Fire D Other Clinician: Referring Provider: Treating Provider/Extender: Tomasa Hosteller Weeks in Treatment: 4 Verbal / Phone Orders: No Diagnosis Coding ICD-10 Coding Code Description [phone] Non-pressure chronic ulcer of left calf with fat layer exposed I87.312 Chronic venous  hypertension (idiopathic) with ulcer of left lower extremity I89.0 Lymphedema, not elsewhere classified Follow-up Appointments ppointment in 2 weeks. - MD visit Return A Nurse Visit: - 1 week for rewrap Dressing Change Frequency Wound #1 Left,Medial  Lower Leg Do not change entire dressing for one week. Skin Barriers/Peri-Wound Care Moisturizing lotion Wound Cleansing May shower with protection. - use cast protector for showering and protection of wrap. Primary Wound Dressing Wound #1 Left,Medial Lower Leg Cutimed Sorbact - thin layer of hydrogel over sorbact Secondary Dressing Wound #1 Left,Medial Lower Leg Dry Gauze Edema Control 3 Layer Compression System - Left Lower Extremity Avoid standing for long periods of time Elevate legs to the level of the heart or above for 30 minutes daily and/or when sitting, a frequency of: - throughout the day Exercise regularly Electronic Signature(s) Signed: 03/14/2020 5:02:14 PM By: Levan Hurst RN, BSN Signed: 03/14/2020 5:29:29 PM By: Linton Ham MD Entered By: Levan Hurst on 03/14/2020 09:33:00 -------------------------------------------------------------------------------- Problem List Details Patient Name: Date of Service: Maria Murphy, [place] 03/14/2020 9:00 [place] Number: [id number] Patient Account Number: [id number] Date of Birth/Sex: Treating RN: 02-24-1956 (64 y.o. [name] Primary Care Provider: Rosita Fire D Other Clinician: Referring Provider: Treating Provider/Extender: [employer] Weeks in Treatment: 4 Active Problems ICD-10 Encounter Code Description Active Date MDM Diagnosis [phone] Non-pressure chronic ulcer of left calf with fat layer exposed 02/15/2020 No Yes I87.312 Chronic venous hypertension (idiopathic) with ulcer of left lower extremity 02/15/2020 No Yes I89.0 Lymphedema, not elsewhere classified 02/15/2020 No Yes Inactive Problems Resolved Problems Electronic  Signature(s) Signed: 03/14/2020 5:29:29 PM By: Linton Ham MD Entered By: Linton Ham on 03/14/2020 09:35:07 -------------------------------------------------------------------------------- Progress Note Details Patient Name: Date of Service: Maria Murphy, [place] RIA 03/14/2020 9:00 [place] Record Number: [id number] Patient Account Number: [id number] Date of Birth/Sex: Treating RN: Nov 26, 1955 (64 y.o. [name] Primary Care Provider: Rosita Fire D Other Clinician: Referring Provider: Treating Provider/Extender: [employer] Weeks in Treatment: 4 Subjective History of Present Illness (HPI) ADMISSION 02/15/2020 This is a 64 year old woman who is a type II diabetic on oral agents. She states that 3 months ago a friend's cat scratched her on the medial left lower extremity. This opened into a wound she has not been able to get this to close. She has largely been using topical antibiotic ointments of various description over-the-counter. She saw Dr. Legrand Rams who is her primary doctor on 6/10 she was given a course of doxycycline which she is finished. Past medical history includes knee pain bilaterally secondary to osteoarthritis, hypertension, type 2 diabetes, probable diabetic neuropathy ABI was 0.96 on the left 7/1; this is a patient with lymphedema. This was initially a cat scratch injury. She came into the clinic last time with a punched out area on the left medial lower leg. Aggressive debridement last week we have been using Iodoflex. 7/8; using Iodoflex under compression. Still adherent debris requiring debridement I changed to Sorbact 7/22 using Sorbact under compression. The wound is measuring smaller and the surface looks a lot better. My plan is been to change to collagen today however there was such an improvement I continued with the Sorbact Objective Constitutional Sitting or standing Blood Pressure is within target range for patient.. Pulse  regular and within target range for patient.Marland Kitchen Respirations regular, non-labored and within target range.. Temperature is normal and within the target range for the patient.Marland Kitchen Appears in no distress. Vitals Time Taken: 9:15 AM, Height: 64 in, Weight: 197 lbs, BMI: 33.8, Temperature: 98.2 F, Pulse: 68 bpm, Respiratory Rate: 18 breaths/min, Blood Pressure: 137/84 mmHg. Respiratory work of breathing is normal. Cardiovascular Pedal pulses are palpable on the left. Good edema control. General  Notes: Wound exam; the area in question is on the left medial lower extremity. Circular wound. This appears to have filled in nicely not nearly the same depth and surface area is improved. Under illumination the surface of the wound looks healthy. No mechanical debridement was felt to be necessary. The wound was debrided with wound cleanser and gauze. No evidence of surrounding infection Integumentary (Hair, Skin) Wound #1 status is Open. Original cause of wound was Trauma. The wound is located on the Left,Medial Lower Leg. The wound measures 0.9cm length x 1.3cm width x 0.2cm depth; 0.919cm^2 area and 0.184cm^3 volume. There is Fat Layer (Subcutaneous Tissue) Exposed exposed. There is no tunneling or undermining noted. There is a small amount of serosanguineous drainage noted. The wound margin is distinct with the outline attached to the wound base. There is large (67-100%) red granulation within the wound bed. There is a small (1-33%) amount of necrotic tissue within the wound bed including Adherent Slough. Assessment Active Problems ICD-10 Non-pressure chronic ulcer of left calf with fat layer exposed Chronic venous hypertension (idiopathic) with ulcer of left lower extremity Lymphedema, not elsewhere classified Procedures Wound #1 Pre-procedure diagnosis of Wound #1 is a Trauma, Other located on the Left,Medial Lower Leg . There was a Three Layer Compression Therapy Procedure by Levan Hurst, RN. Post  procedure Diagnosis Wound #1: Same as Pre-Procedure Plan Follow-up Appointments: Return Appointment in 2 weeks. - MD visit Nurse Visit: - 1 week for rewrap Dressing Change Frequency: Wound #1 Left,Medial Lower Leg: Do not change entire dressing for one week. Skin Barriers/Peri-Wound Care: Moisturizing lotion Wound Cleansing: May shower with protection. - use cast protector for showering and protection of wrap. Primary Wound Dressing: Wound #1 Left,Medial Lower Leg: Cutimed Sorbact - thin layer of hydrogel over sorbact Secondary Dressing: Wound #1 Left,Medial Lower Leg: Dry Gauze Edema Control: 3 Layer Compression System - Left Lower Extremity Avoid standing for long periods of time Elevate legs to the level of the heart or above for 30 minutes daily and/or when sitting, a frequency of: - throughout the day Exercise regularly 1. Improvement in surface area and condition of the wound. So much so I elected to push on with the Sorbact rather than changing to a collagen-based dressing. Still under 3 layer compression which she is tolerating well. 2. Should be back next week for a nurse visit Electronic Signature(s) Signed: 03/14/2020 5:29:29 PM By: Linton Ham MD Entered By: Linton Ham on 03/14/2020 09:38:35 -------------------------------------------------------------------------------- [place] Details Patient Name: Date of Service: Maria Murphy, [place] RIA 03/14/2020 Medical Record Number: [id number] Patient Account Number: [id number] Date of Birth/Sex: Treating RN: 07/29/1956 (64 y.o. [name] Primary Care Provider: Rosita Fire D Other Clinician: Referring Provider: Treating Provider/Extender: [employer] Weeks in Treatment: 4 Diagnosis Coding ICD-10 Codes Code Description [phone] Non-pressure chronic ulcer of left calf with fat layer exposed I87.312 Chronic venous hypertension (idiopathic) with ulcer of left lower extremity I89.0  Lymphedema, not elsewhere classified Facility Procedures CPT4 Code: [id number] Description: (Facility Use Only) [phone] - [facility] LWR LT LEG Modifier: Quantity: 1 Physician Procedures : CPT4 Code Description Modifier [id number] - WC PHYS LEVEL 3 - EST PT ICD-10 Diagnosis Description L97.222 Non-pressure chronic ulcer of left calf with fat layer exposed I87.312 Chronic venous hypertension (idiopathic) with ulcer of left lower  extremity I89.0 Lymphedema, not elsewhere classified Quantity: 1 Electronic Signature(s) Signed: 03/14/2020 5:02:14 PM By: Levan Hurst RN, BSN Signed: 03/14/2020 5:29:29 PM By: Linton Ham MD Entered  By: Levan Hurst on 03/14/2020 09:39:47

## 2020-03-14 NOTE — Progress Notes (Signed)
Photo Uploaded By: [name] on 03/14/2020 15:09:20 Wound  Measurements Length: (cm) 0.9 Width: (cm) 1.3 Depth: (cm) 0.2 Area: (cm) 0.91 Volume: (cm) 0.18 % Reduction in Area: 35% % Reduction in Volume: -30.5% Epithelialization: None 9 Tunneling: No 4 Undermining: No Wound Description Classification: Full Thickness Without Exposed Support Struc Wound Margin: Distinct, outline attached Exudate Amount: Small Exudate Type: Serosanguineous Exudate Color: red, brown tures Foul Odor After Cleansing: No Slough/Fibrino Yes Wound Bed Granulation Amount: Large (67-100%) Exposed Structure Granulation Quality: Red Fascia Exposed: No Necrotic Amount: Small (1-33%) Fat Layer (Subcutaneous Tissue) Exposed: Yes Necrotic Quality: Adherent Slough Tendon Exposed: No Muscle Exposed: No Joint Exposed: No Bone Exposed: No Treatment Notes Wound #1 (Left, Medial Lower Leg) 2. Periwound Care Moisturizing lotion 3. Primary Dressing Applied Hydrogel or K-Y Jelly Other primary dressing (specifiy in notes) 4. Secondary Dressing Dry Gauze 6. Support Layer Applied 3 layer compression wrap Notes sorbact, netting Electronic Signature(s) Signed: 03/14/2020 4:44:31 PM By: Kela Millin Entered By: Kela Millin on 03/14/2020 09:23:00 -------------------------------------------------------------------------------- Vitals Details Patient Name: Date of Service: Maria Murphy, [place] RIA 03/14/2020 9:00 [place] Number: [id number] Patient Account Number: [id number] Date of Birth/Sex: Treating RN: [birth year] (64 y.o. Clearnce Sorrel Primary Care Teila Skalsky: Rosita Fire D Other Clinician: Referring Estephany Perot: Treating Ashayla Subia/Extender: [employer] Weeks in Treatment: 4 Vital Signs Time Taken: 09:15 Temperature (F): 98.2 Height (in): 64 Pulse (bpm): 68 Weight (lbs): 197 Respiratory Rate (breaths/min): 18 Body Mass Index (BMI): 33.8 Blood Pressure (mmHg): 137/84 Reference Range: 80 - 120 mg / dl Electronic  Signature(s) Signed: 03/14/2020 4:44:31 PM By: Kela Millin Entered By: Kela Millin on 03/14/2020 09:16:40  Temperature(F): 98.2 Respiratory Rate(breaths/min): 18 Photos: [1:No Photos Left, Medial Lower Leg] [N/A:N/A N/A] Wound Location: [1:Trauma] [N/A:N/A] Wounding Event: [1:Trauma, Other] [N/A:N/A] Primary Etiology: [1:Arrhythmia, Hypertension] [N/A:N/A] Comorbid History: [1:11/02/2019] [N/A:N/A] Date Acquired: [1:4] [N/A:N/A] Weeks of Treatment: [1:Open] [N/A:N/A] Wound Status: [1:0.9x1.3x0.2] [N/A:N/A] Measurements L x W x D (cm) [1:0.919] [N/A:N/A] A (cm) : rea [1:0.184] [N/A:N/A] Volume (cm) : [1:35.00%] [N/A:N/A] % Reduction in A rea: [1:-30.50%] [N/A:N/A] % Reduction in Volume: [1:Full Thickness Without Exposed] [N/A:N/A] Classification: [1:Support Structures Small] [N/A:N/A] Exudate Amount: [1:Serosanguineous] [N/A:N/A] Exudate Type: [1:red, brown] [N/A:N/A] Exudate Color: [1:Distinct, outline attached] [N/A:N/A] Wound Margin: [1:Large (67-100%)] [N/A:N/A] Granulation Amount: [1:Red] [N/A:N/A] Granulation Quality: [1:Small (1-33%)] [N/A:N/A] Necrotic Amount: [1:Fat Layer (Subcutaneous Tissue)] [N/A:N/A] Exposed Structures: [1:Exposed: Yes Fascia: No Tendon: No Muscle: No Joint: No Bone: No None] [N/A:N/A] Epithelialization:  [1:Compression Therapy] [N/A:N/A] Treatment Notes Electronic Signature(s) Signed: 03/14/2020 5:02:14 PM By: Levan Hurst RN, BSN Signed: 03/14/2020 5:29:29 PM By: Linton Ham MD Entered By: Linton Ham on 03/14/2020 09:35:21 -------------------------------------------------------------------------------- Multi-Disciplinary Care Plan Details Patient Name: Date of Service: Maria Murphy, [place] 03/14/2020 9:00 [place] Record Number: [id number] Patient Account Number: [id number] Date of Birth/Sex: Treating RN: 1956-01-09 (64 y.o. [name] Primary Care Melea Prezioso: Rosita Fire D Other Clinician: Referring Crystalyn Delia: Treating Keithan Dileonardo/Extender: [employer] Weeks in Treatment: 4 Active Inactive Wound/Skin Impairment Nursing Diagnoses: Knowledge deficit related to ulceration/compromised skin integrity Goals: Patient/caregiver will verbalize understanding of skin care regimen Date Initiated: 02/15/2020 Target Resolution Date: 04/12/2020 Goal Status: Active Interventions: Assess patient/caregiver ability to perform ulcer/skin care regimen upon admission and as needed Assess ulceration(s) every visit Provide education on ulcer and skin care Treatment Activities: Skin care regimen initiated : 02/15/2020 Topical wound management initiated : 02/15/2020 Notes: Electronic Signature(s) Signed: 03/14/2020 5:02:14 PM By: Levan Hurst RN, BSN Entered By: Levan Hurst on 03/14/2020 09:20:58 -------------------------------------------------------------------------------- Pain Assessment Details Patient Name: Date of Service: Maria Murphy, [place] 03/14/2020 9:00 [place] Record Number: [id number] Patient Account Number: [id number] Date of Birth/Sex: Treating RN: Apr 10, 1956 (64 y.o. Clearnce Sorrel Primary Care Zaydn Gutridge: Rosita Fire D Other Clinician: Referring Aundria Bitterman: Treating Shavon Ashmore/Extender: [employer] Weeks in  Treatment: 4 Active Problems Location of Pain Severity and Description of Pain Patient Has Paino No Site Locations Pain Management and Medication Current Pain Management: Electronic Signature(s) Signed: 03/14/2020 4:44:31 PM By: Kela Millin Entered By: Kela Millin on 03/14/2020 09:16:45 -------------------------------------------------------------------------------- Patient/Caregiver Education Details Patient Name: Date of Service: Maria Mirza, [place] RIA 7/22/2021andnbsp9:00 [place] Number: [id number] Patient Account Number: [id number] Date of Birth/Gender: Treating RN: 03-29-1956 (64 y.o. [name] Primary Care Physician: Rosita Fire D Other Clinician: Referring Physician: Treating Physician/Extender: Tomasa Hosteller Weeks in Treatment: 4 Education Assessment Education Provided To: Patient Education Topics Provided Wound/Skin Impairment: Methods: Explain/Verbal Responses: State content correctly Electronic Signature(s) Signed: 03/14/2020 5:02:14 PM By: Levan Hurst RN, BSN Entered By: Levan Hurst on 03/14/2020 09:21:09 -------------------------------------------------------------------------------- Wound Assessment Details Patient Name: Date of Service: Maria Murphy, [place] RIA 03/14/2020 9:00 [place] Record Number: [id number] Patient Account Number: [id number] Date of Birth/Sex: Treating RN: August 24, 1956 (64 y.o. Clearnce Sorrel Primary Care Lilie Vezina: Rosita Fire D Other Clinician: Referring Dave Mergen: Treating Izaha Shughart/Extender: [employer] Weeks in Treatment: 4 Wound Status Wound Number: 1 Primary Etiology: Trauma, Other Wound Location: Left, Medial Lower Leg Wound Status: Open Wounding Event: Trauma Comorbid History: Arrhythmia, Hypertension Date Acquired: 11/02/2019 Weeks Of Treatment: 4 Clustered Wound: No Photos  Photo Uploaded By: [name] on 03/14/2020 15:09:20 Wound  Measurements Length: (cm) 0.9 Width: (cm) 1.3 Depth: (cm) 0.2 Area: (cm) 0.91 Volume: (cm) 0.18 % Reduction in Area: 35% % Reduction in Volume: -30.5% Epithelialization: None 9 Tunneling: No 4 Undermining: No Wound Description Classification: Full Thickness Without Exposed Support Struc Wound Margin: Distinct, outline attached Exudate Amount: Small Exudate Type: Serosanguineous Exudate Color: red, brown tures Foul Odor After Cleansing: No Slough/Fibrino Yes Wound Bed Granulation Amount: Large (67-100%) Exposed Structure Granulation Quality: Red Fascia Exposed: No Necrotic Amount: Small (1-33%) Fat Layer (Subcutaneous Tissue) Exposed: Yes Necrotic Quality: Adherent Slough Tendon Exposed: No Muscle Exposed: No Joint Exposed: No Bone Exposed: No Treatment Notes Wound #1 (Left, Medial Lower Leg) 2. Periwound Care Moisturizing lotion 3. Primary Dressing Applied Hydrogel or K-Y Jelly Other primary dressing (specifiy in notes) 4. Secondary Dressing Dry Gauze 6. Support Layer Applied 3 layer compression wrap Notes sorbact, netting Electronic Signature(s) Signed: 03/14/2020 4:44:31 PM By: Kela Millin Entered By: Kela Millin on 03/14/2020 09:23:00 -------------------------------------------------------------------------------- Vitals Details Patient Name: Date of Service: Maria Murphy, [place] RIA 03/14/2020 9:00 [place] Number: [id number] Patient Account Number: [id number] Date of Birth/Sex: Treating RN: [birth year] (64 y.o. Clearnce Sorrel Primary Care Teila Skalsky: Rosita Fire D Other Clinician: Referring Estephany Perot: Treating Ashayla Subia/Extender: [employer] Weeks in Treatment: 4 Vital Signs Time Taken: 09:15 Temperature (F): 98.2 Height (in): 64 Pulse (bpm): 68 Weight (lbs): 197 Respiratory Rate (breaths/min): 18 Body Mass Index (BMI): 33.8 Blood Pressure (mmHg): 137/84 Reference Range: 80 - 120 mg / dl Electronic  Signature(s) Signed: 03/14/2020 4:44:31 PM By: Kela Millin Entered By: Kela Millin on 03/14/2020 09:16:40

## 2020-03-18 ENCOUNTER — Other Ambulatory Visit: Payer: Self-pay | Admitting: Internal Medicine

## 2020-03-18 ENCOUNTER — Other Ambulatory Visit (HOSPITAL_COMMUNITY): Payer: Self-pay | Admitting: Internal Medicine

## 2020-03-18 DIAGNOSIS — R413 Other amnesia: Secondary | ICD-10-CM

## 2020-03-19 ENCOUNTER — Other Ambulatory Visit: Payer: Self-pay

## 2020-03-19 ENCOUNTER — Ambulatory Visit (HOSPITAL_COMMUNITY)
Admission: RE | Admit: 2020-03-19 | Discharge: 2020-03-19 | Disposition: A | Discharge status: Home / Self Care | Payer: Medicaid Other | Source: Ambulatory Visit | Attending: Internal Medicine | Admitting: Internal Medicine

## 2020-03-19 DIAGNOSIS — R413 Other amnesia: Secondary | ICD-10-CM | POA: Insufficient documentation

## 2020-03-21 ENCOUNTER — Other Ambulatory Visit: Payer: Self-pay

## 2020-03-21 ENCOUNTER — Encounter (HOSPITAL_BASED_OUTPATIENT_CLINIC_OR_DEPARTMENT_OTHER): Payer: Medicaid Other | Admitting: Internal Medicine

## 2020-03-21 DIAGNOSIS — E11621 Type 2 diabetes mellitus with foot ulcer: Secondary | ICD-10-CM | POA: Diagnosis not present

## 2020-03-25 ENCOUNTER — Other Ambulatory Visit: Payer: Self-pay

## 2020-03-25 ENCOUNTER — Encounter (HOSPITAL_COMMUNITY): Payer: Self-pay

## 2020-03-25 DIAGNOSIS — Z79899 Other long term (current) drug therapy: Secondary | ICD-10-CM | POA: Diagnosis not present

## 2020-03-25 DIAGNOSIS — M130 Polyarthritis, unspecified: Secondary | ICD-10-CM | POA: Diagnosis not present

## 2020-03-25 DIAGNOSIS — M25529 Pain in unspecified elbow: Secondary | ICD-10-CM | POA: Diagnosis not present

## 2020-03-25 DIAGNOSIS — M25512 Pain in left shoulder: Secondary | ICD-10-CM | POA: Diagnosis not present

## 2020-03-25 DIAGNOSIS — I10 Essential (primary) hypertension: Secondary | ICD-10-CM | POA: Insufficient documentation

## 2020-03-25 DIAGNOSIS — M26629 Arthralgia of temporomandibular joint, unspecified side: Secondary | ICD-10-CM | POA: Diagnosis not present

## 2020-03-25 DIAGNOSIS — M791 Myalgia, unspecified site: Secondary | ICD-10-CM | POA: Diagnosis not present

## 2020-03-25 DIAGNOSIS — G8929 Other chronic pain: Secondary | ICD-10-CM | POA: Insufficient documentation

## 2020-03-25 DIAGNOSIS — M25539 Pain in unspecified wrist: Secondary | ICD-10-CM | POA: Diagnosis not present

## 2020-03-25 DIAGNOSIS — Z85038 Personal history of other malignant neoplasm of large intestine: Secondary | ICD-10-CM | POA: Diagnosis not present

## 2020-03-25 DIAGNOSIS — M25511 Pain in right shoulder: Secondary | ICD-10-CM | POA: Diagnosis present

## 2020-03-25 NOTE — ED Triage Notes (Addendum)
POV from home c/o Arthritic Pain. States it is hard to walk and do ADLs. States its just been getting worse. No one at home to help.  a friend dropped her off here.

## 2020-03-26 ENCOUNTER — Emergency Department (HOSPITAL_COMMUNITY): Payer: Medicaid Other

## 2020-03-26 ENCOUNTER — Emergency Department (HOSPITAL_COMMUNITY)
Admission: EM | Admit: 2020-03-26 | Discharge: 2020-03-26 | Disposition: A | Discharge status: Home / Self Care | Payer: Medicaid Other | Attending: Emergency Medicine | Admitting: Emergency Medicine

## 2020-03-26 DIAGNOSIS — M2550 Pain in unspecified joint: Secondary | ICD-10-CM

## 2020-03-26 LAB — COMPREHENSIVE METABOLIC PANEL
ALT: 10 U/L (ref 0–44)
AST: 20 U/L (ref 15–41)
Albumin: 4.7 g/dL (ref 3.5–5.0)
Alkaline Phosphatase: 41 U/L (ref 38–126)
Anion gap: 10 (ref 5–15)
BUN: 15 mg/dL (ref 8–23)
CO2: 24 mmol/L (ref 22–32)
Calcium: 8.9 mg/dL (ref 8.9–10.3)
Chloride: 102 mmol/L (ref 98–111)
Creatinine, Ser: 0.87 mg/dL (ref 0.44–1.00)
GFR calc Af Amer: >60 mL/min (ref >60)
GFR calc non Af Amer: >60 mL/min (ref >60)
Glucose, Bld: 115 mg/dL — ABNORMAL HIGH (ref 70–99)
Potassium: 3.7 mmol/L (ref 3.5–5.1)
Sodium: 136 mmol/L (ref 135–145)
Total Bilirubin: 0.7 mg/dL (ref 0.3–1.2)
Total Protein: 7.8 g/dL (ref 6.5–8.1)

## 2020-03-26 LAB — CBC
HCT: 43.4 % (ref 36.0–46.0)
Hemoglobin: 14.2 g/dL (ref 12.0–15.0)
MCH: 29.6 pg (ref 26.0–34.0)
MCHC: 32.7 g/dL (ref 30.0–36.0)
MCV: 90.6 fL (ref 80.0–100.0)
Platelets: 179 10*3/uL (ref 150–400)
RBC: 4.79 MIL/uL (ref 3.87–5.11)
RDW: 13.9 % (ref 11.5–15.5)
WBC: 5.4 10*3/uL (ref 4.0–10.5)
nRBC: 0 % (ref 0.0–0.2)

## 2020-03-26 LAB — URIC ACID: Uric Acid, Serum: 5.6 mg/dL (ref 2.5–7.1)

## 2020-03-26 LAB — SEDIMENTATION RATE: Sed Rate: 10 mm/hr (ref 0–22)

## 2020-03-26 LAB — CK: Total CK: 75 U/L (ref 38–234)

## 2020-03-26 MED ORDER — KETOROLAC TROMETHAMINE 30 MG/ML IJ SOLN
30.0000 mg | Freq: Once | INTRAMUSCULAR | Status: AC
  Administered 2020-03-26: 30 mg via INTRAMUSCULAR
  Filled 2020-03-26: qty 1

## 2020-03-26 MED ORDER — METHOCARBAMOL 500 MG PO TABS
500.0000 mg | ORAL_TABLET | Freq: Three times a day (TID) | ORAL | 0 refills | Status: DC | PRN
Start: 2020-03-26 — End: 2020-05-17

## 2020-03-26 MED ORDER — NAPROXEN 500 MG PO TABS: 500.0000 mg | ORAL_TABLET | Freq: Two times a day (BID) | ORAL | 0 refills | Status: DC | PRN

## 2020-03-26 MED ORDER — HYDROCODONE-ACETAMINOPHEN 5-325 MG PO TABS
1.0000 | ORAL_TABLET | Freq: Once | ORAL | Status: AC
  Administered 2020-03-26: 1 via ORAL
  Filled 2020-03-26: qty 1

## 2020-03-26 NOTE — ED Notes (Signed)
Pt able to walk a few steps with cane

## 2020-03-26 NOTE — ED Notes (Signed)
+   pulse in left foot . Site marked with X

## 2020-03-26 NOTE — Discharge Instructions (Addendum)
Take the anti-inflammatories as prescribed.  Your x-rays show severe arthritis in her knees worse on the right.  There is no evidence of infection.  You should follow-up with your PCP as well as the orthopedic doctor to discuss possible knee replacement. You will be called by the case manager to arrange for home health with physical therapy and a nurse aide. Return to the ED with worsening pain, fever, vomiting, chest pain, shortness of breath, or any other concerns.
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
## 2020-03-27 NOTE — Progress Notes (Signed)
ARNOLD, DEPINTO ([id number]) Visit Report for 03/21/2020 SuperBill Details Patient Name: Date of Service: [name], [place] RIA 03/21/2020 Medical Record Number: [id number] Patient Account Number: [id number] Date of Birth/Sex: Treating RN: 1956-07-08 (64 y.o. [name] Primary Care Provider: Rosita Fire D Other Clinician: Referring Provider: Treating Provider/Extender: [employer] D Weeks in Treatment: 5 Diagnosis Coding ICD-10 Codes Code Description [phone] Non-pressure chronic ulcer of left calf with fat layer exposed I87.312 Chronic venous hypertension (idiopathic) with ulcer of left lower extremity I89.0 Lymphedema, not elsewhere classified Facility Procedures CPT4 Code Description Modifier Quantity [phone] (Facility Use Only) [phone] - APPLY MULTLAY COMPRS LWR LT LEG 1 Electronic Signature(s) Signed: 03/21/2020 11:53:13 AM By: Baruch Gouty RN, BSN Signed: 03/27/2020 9:56:59 AM By: Linton Ham MD Entered By: Baruch Gouty on 03/21/2020 09:52:26

## 2020-03-27 NOTE — Progress Notes (Signed)
0.919 Volume: (cm) 0.184 % Reduction in Area: 35% % Reduction in Volume: -30.5% Epithelialization: Small (1-33%) Tunneling: No Undermining: No Wound Description Classification: Full Thickness Without Exposed Support Structures Wound Margin: Flat and Intact Exudate Amount: Small Exudate Type: Serosanguineous Exudate Color: red, brown Foul Odor After Cleansing: No Slough/Fibrino Yes Wound Bed Granulation Amount: Large (67-100%) Exposed Structure Granulation Quality: Red Fascia Exposed: No Necrotic Amount: None Present (0%) Fat Layer (Subcutaneous Tissue) Exposed: Yes Tendon Exposed: No Muscle Exposed: No Joint Exposed: No Bone Exposed: No Treatment Notes Wound #1 (Left, Medial Lower Leg) 2. Periwound Care Moisturizing lotion 3. Primary Dressing Applied Hydrogel or K-Y Jelly Other primary dressing (specifiy in notes) 4. Secondary Dressing Dry Gauze 6. Support Layer Applied 3 layer compression wrap Notes sorbact, netting Electronic Signature(s) Signed: 03/21/2020 11:53:13 AM By: Baruch Gouty RN,  BSN Entered By: Baruch Gouty on 03/21/2020 09:52:08 -------------------------------------------------------------------------------- [place] Details Patient Name: Date of Service: [name], [place] RIA 03/21/2020 9:00 [place] Number: [id number] Patient Account Number: [id number] Date of Birth/Sex: Treating RN: 01-30-1956 (64 y.o. [name] Primary Care Chyanne Kohut: Rosita Fire D Other Clinician: Referring Kathee Tumlin: Treating Ashyr Hedgepath/Extender: [employer] D Weeks in Treatment: 5 Vital Signs Time Taken: 09:11 Temperature (F): 98.3 Height (in): 64 Pulse (bpm): 74 Weight (lbs): 197 Respiratory Rate (breaths/min): 18 Body Mass Index (BMI): 33.8 Blood Pressure (mmHg): 129/84 Reference Range: 80 - 120 mg / dl Electronic Signature(s) Signed: 03/27/2020 8:00:11 AM By: Sandre Kitty Entered By: Sandre Kitty on 03/21/2020 09:11:24  CONSETTA, COSNER ([id number]) Visit Report for 03/21/2020 Arrival Information Details Patient Name: Date of Service: [name], [place] RIA 03/21/2020 9:00 [place] Record Number: [id number] Patient Account Number: [id number] Date of Birth/Sex: Treating RN: 1956-03-10 (64 y.o. Martyn Malay, [place] Primary Care Aristeo Hankerson: Rosita Fire D Other Clinician: Referring Cash Meadow: Treating Harlee Eckroth/Extender: Tomasa Hosteller Weeks in Treatment: 5 Visit Information History Since Last Visit Added or deleted any medications: No Patient Arrived: [name] Any new allergies or adverse reactions: No Arrival Time: 09:07 Had a fall or experienced change in No Accompanied By: self activities of daily living that may affect Transfer Assistance: None risk of falls: Patient Identification Verified: Yes Signs or symptoms of abuse/neglect since last visito No Secondary Verification Process Completed: Yes Hospitalized since last visit: No Patient Requires Transmission-Based Precautions: No Implantable device outside of the clinic excluding No Patient Has Alerts: Yes cellular tissue based products placed in the center since last visit: Has Dressing in Place as Prescribed: Yes Pain Present Now: No Electronic Signature(s) Signed: 03/27/2020 8:00:11 AM By: Sandre Kitty Entered By: Sandre Kitty on 03/21/2020 09:10:20 -------------------------------------------------------------------------------- Compression Therapy Details Patient Name: Date of Service: [name], [place] RIA 03/21/2020 9:00 [place] Record Number: [id number] Patient Account Number: [id number] Date of Birth/Sex: Treating RN: 09/14/1955 (64 y.o. [name] Primary Care Blessing Ozga: Rosita Fire D Other Clinician: Referring Paden Senger: Treating Kalyan Barabas/Extender: [employer] Weeks in Treatment: 5 Compression Therapy Performed for Wound Assessment: Wound #1 Left,Medial Lower Leg Performed By: Clinician  Baruch Gouty, RN Compression Type: Three Layer Electronic Signature(s) Signed: 03/21/2020 11:53:13 AM By: Baruch Gouty RN, BSN Entered By: Baruch Gouty on 03/21/2020 09:50:24 -------------------------------------------------------------------------------- Encounter Discharge Information Details Patient Name: Date of Service: [name], [place] RIA 03/21/2020 9:00 [place] Number: [id number] Patient Account Number: [id number] Date of Birth/Sex: Treating RN: 1956-05-10 (64 y.o. [name] Primary Care Spenser Cong: Other Clinician: Rosita Fire D Referring Minnette Merida: Treating Cabrini Ruggieri/Extender: Tomasa Hosteller Weeks in Treatment: 5 Encounter Discharge Information Items Discharge Condition: Stable Ambulatory Status: Cane Discharge Destination: Home Transportation: Private Auto Accompanied By: self Schedule Follow-up Appointment: Yes Clinical Summary of Care: Patient Declined Electronic Signature(s) Signed: 03/21/2020 11:53:13 AM By: Baruch Gouty RN, BSN Entered By: Baruch Gouty on 03/21/2020 09:51:16 -------------------------------------------------------------------------------- Patient/Caregiver Education Details Patient Name: Date of Service: [name], [place] RIA 7/29/2021andnbsp9:00 [place] Number: [id number] Patient Account Number: [id number] Date of Birth/Gender: Treating RN: 18-Mar-1956 (64 y.o. [name] Primary Care Physician: Rosita Fire D Other Clinician: Referring Physician: Treating Physician/Extender: Tomasa Hosteller Weeks in Treatment: 5 Education Assessment Education Provided To: Patient Education Topics Provided Venous: Methods: Explain/Verbal Responses: Reinforcements needed, State content correctly Wound/Skin Impairment: Methods: Explain/Verbal Responses: Reinforcements needed, State content correctly Electronic Signature(s) Signed: 03/21/2020 11:53:13 AM By: Baruch Gouty  RN, BSN Entered By: Baruch Gouty on 03/21/2020 09:50:59 -------------------------------------------------------------------------------- Wound Assessment Details Patient Name: Date of Service: [name], [place] RIA 03/21/2020 9:00 [place] Record Number: [id number] Patient Account Number: [id number] Date of Birth/Sex: Treating RN: Nov 07, 1955 (64 y.o. [name] Primary Care Danesha Kirchoff: Rosita Fire D Other Clinician: Referring Rogenia Werntz: Treating Alethea Terhaar/Extender: [employer] Weeks in Treatment: 5 Wound Status Wound Number: 1 Primary Etiology: Trauma, Other Wound Location: Left, Medial Lower Leg Wound Status: Open Wounding Event: Trauma Comorbid History: Arrhythmia, Hypertension Date Acquired: 11/02/2019 Weeks Of Treatment: 5 Clustered Wound: No Wound Measurements Length: (cm) 0.9 Width: (cm) 1.3 Depth: (cm) 0.2 Area: (cm)

## 2020-03-28 ENCOUNTER — Encounter (HOSPITAL_BASED_OUTPATIENT_CLINIC_OR_DEPARTMENT_OTHER): Payer: Medicaid Other | Attending: Internal Medicine | Admitting: Physician Assistant

## 2020-03-28 ENCOUNTER — Other Ambulatory Visit: Payer: Self-pay

## 2020-03-28 DIAGNOSIS — W5503XA Scratched by cat, initial encounter: Secondary | ICD-10-CM | POA: Diagnosis not present

## 2020-03-28 DIAGNOSIS — L97222 Non-pressure chronic ulcer of left calf with fat layer exposed: Secondary | ICD-10-CM | POA: Insufficient documentation

## 2020-03-28 DIAGNOSIS — S80812A Abrasion, left lower leg, initial encounter: Secondary | ICD-10-CM | POA: Diagnosis not present

## 2020-03-28 DIAGNOSIS — I87312 Chronic venous hypertension (idiopathic) with ulcer of left lower extremity: Secondary | ICD-10-CM | POA: Insufficient documentation

## 2020-03-28 DIAGNOSIS — M1990 Unspecified osteoarthritis, unspecified site: Secondary | ICD-10-CM | POA: Diagnosis not present

## 2020-03-28 DIAGNOSIS — I890 Lymphedema, not elsewhere classified: Secondary | ICD-10-CM | POA: Diagnosis not present

## 2020-03-28 DIAGNOSIS — I10 Essential (primary) hypertension: Secondary | ICD-10-CM | POA: Insufficient documentation

## 2020-03-28 DIAGNOSIS — E11622 Type 2 diabetes mellitus with other skin ulcer: Secondary | ICD-10-CM | POA: Insufficient documentation

## 2020-03-28 NOTE — Progress Notes (Addendum)
LISETH, WANN ([id number]) Visit Report for 03/28/2020 Chief Complaint Document Details Patient Name: Date of Service: Maria Murphy, [place] RIA 03/28/2020 3:15 PM Medical Record Number: [id number] Patient Account Number: [id number] Date of Birth/Sex: Treating RN: Aug 27, 1955 (64 y.o. [name] Primary Care Provider: Rosita Fire D Other Clinician: Referring Provider: Treating Provider/Extender: Mackie Pai D Weeks in Treatment: 6 Information Obtained from: Patient Chief Complaint 02/15/2020; patient is here for review of a wound on the left medial lower extremity Electronic Signature(s) Signed: 03/28/2020 3:18:19 PM By: Worthy Keeler PA-C Entered By: Worthy Keeler on 03/28/2020 15:18:18 -------------------------------------------------------------------------------- HPI Details Patient Name: Date of Service: Maria Murphy, [place] RIA 03/28/2020 3:15 PM Medical Record Number: [id number] Patient Account Number: [id number] Date of Birth/Sex: Treating RN: December 20, 1955 (64 y.o. [name] Primary Care Provider: Rosita Fire D Other Clinician: Referring Provider: Treating Provider/Extender: Mackie Pai D Weeks in Treatment: 6 History of Present Illness HPI Description: ADMISSION 02/15/2020 This is a 64 year old woman who is a type II diabetic on oral agents. She states that 3 months ago a friend's cat scratched her on the medial left lower extremity. This opened into a wound she has not been able to get this to close. She has largely been using topical antibiotic ointments of various description over-the-counter. She saw Dr. Legrand Rams who is her primary doctor on 6/10 she was given a course of doxycycline which she is finished. Past medical history includes knee pain bilaterally secondary to osteoarthritis, hypertension, type 2 diabetes, probable diabetic neuropathy ABI was 0.96 on the left 7/1; this is a patient with lymphedema. This was initially a cat  scratch injury. She came into the clinic last time with a punched out area on the left medial lower leg. Aggressive debridement last week we have been using Iodoflex. 7/8; using Iodoflex under compression. Still adherent debris requiring debridement I changed to Sorbact 7/22 using Sorbact under compression. The wound is measuring smaller and the surface looks a lot better. My plan is been to change to collagen today however there was such an improvement I continued with the Sorbact 03/28/2020 upon evaluation today patient appears to be doing excellent in regard to her lower extremity. This is almost completely healed there is just a small pinpoint areas really to that are over the original wound location. This is almost completely closed and overall I am extremely pleased with where things stand. Electronic Signature(s) Signed: 03/28/2020 4:26:28 PM By: Worthy Keeler PA-C Entered By: Worthy Keeler on 03/28/2020 71:24:58 -------------------------------------------------------------------------------- Physical Exam Details Patient Name: Date of Service: Maria Murphy, [place] RIA 03/28/2020 3:15 PM Medical Record Number: [id number] Patient Account Number: [id number] Date of Birth/Sex: Treating RN: December 09, 1955 (64 y.o. [name] Primary Care Provider: Rosita Fire D Other Clinician: Referring Provider: Treating Provider/Extender: Mackie Pai D Weeks in Treatment: 6 Constitutional Well-nourished and well-hydrated in no acute distress. Respiratory normal breathing without difficulty. Psychiatric this patient is able to make decisions and demonstrates good insight into disease process. Alert and Oriented x 3. pleasant and cooperative. Notes It showed signs of excellent epithelization of does not appear to be any evidence of infection overall I think she is progressing quite nicely. She is very happy that this is doing so well and she is having no pain. Electronic  Signature(s) Signed: 03/28/2020 4:27:08 PM By: Worthy Keeler PA-C Entered By: Worthy Keeler on 03/28/2020 16:27:08 -------------------------------------------------------------------------------- Physician Orders Details Patient Name: Date of Service: Maria NTE,  [place] RIA 03/28/2020 3:15 PM Medical Record Number: [id number] Patient Account Number: [id number] Date of Birth/Sex: Treating RN: 1955-11-09 (64 y.o. [name] Primary Care Provider: Rosita Fire D Other Clinician: Referring Provider: Treating Provider/Extender: Mackie Pai D Weeks in Treatment: 6 Verbal / Phone Orders: No Diagnosis Coding ICD-10 Coding Code Description [phone] Non-pressure chronic ulcer of left calf with fat layer exposed I87.312 Chronic venous hypertension (idiopathic) with ulcer of left lower extremity I89.0 Lymphedema, not elsewhere classified Follow-up Appointments Return Appointment in 1 week. Dressing Change Frequency Wound #1 Left,Medial Lower Leg Do not change entire dressing for one week. Skin Barriers/Peri-Wound Care Moisturizing lotion Wound Cleansing May shower with protection. - use cast protector for showering and protection of wrap. Primary Wound Dressing Wound #1 Left,Medial Lower Leg Cutimed Sorbact - thin layer of hydrogel over sorbact Secondary Dressing Wound #1 Left,Medial Lower Leg Dry Gauze Edema Control 3 Layer Compression System - Left Lower Extremity Avoid standing for long periods of time Elevate legs to the level of the heart or above for 30 minutes daily and/or when sitting, a frequency of: - throughout the day Exercise regularly Electronic Signature(s) Signed: 03/28/2020 5:21:32 PM By: Levan Hurst RN, BSN Signed: 03/28/2020 6:02:08 PM By: Worthy Keeler PA-C Entered By: Levan Hurst on 03/28/2020 16:20:39 -------------------------------------------------------------------------------- Problem List Details Patient Name: Date of  Service: Maria Murphy, Maria Murphy RIA 03/28/2020 3:15 PM Medical Record Number: [id number] Patient Account Number: [id number] Date of Birth/Sex: Treating RN: June 10, 1956 (64 y.o. [name] Primary Care Provider: Rosita Fire D Other Clinician: Referring Provider: Treating Provider/Extender: Mackie Pai D Weeks in Treatment: 6 Active Problems ICD-10 Encounter Code Description Active Date MDM Diagnosis [phone] Non-pressure chronic ulcer of left calf with fat layer exposed 02/15/2020 No Yes I87.312 Chronic venous hypertension (idiopathic) with ulcer of left lower extremity 02/15/2020 No Yes I89.0 Lymphedema, not elsewhere classified 02/15/2020 No Yes Inactive Problems Resolved Problems Electronic Signature(s) Signed: 03/28/2020 3:18:03 PM By: Worthy Keeler PA-C Entered By: Worthy Keeler on 03/28/2020 15:18:03 -------------------------------------------------------------------------------- Progress Note Details Patient Name: Date of Service: Maria Murphy, Maria Murphy RIA 03/28/2020 3:15 PM Medical Record Number: [id number] Patient Account Number: [id number] Date of Birth/Sex: Treating RN: May 20, 1956 (64 y.o. [name] Primary Care Provider: Rosita Fire D Other Clinician: Referring Provider: Treating Provider/Extender: Mackie Pai D Weeks in Treatment: 6 Subjective Chief Complaint Information obtained from Patient 02/15/2020; patient is here for review of a wound on the left medial lower extremity History of Present Illness (HPI) ADMISSION 02/15/2020 This is a 63 year old woman who is a type II diabetic on oral agents. She states that 3 months ago a friend's cat scratched her on the medial left lower extremity. This opened into a wound she has not been able to get this to close. She has largely been using topical antibiotic ointments of various description over-the-counter. She saw Dr. Legrand Rams who is her primary doctor on 6/10 she was given a course of  doxycycline which she is finished. Past medical history includes knee pain bilaterally secondary to osteoarthritis, hypertension, type 2 diabetes, probable diabetic neuropathy ABI was 0.96 on the left 7/1; this is a patient with lymphedema. This was initially a cat scratch injury. She came into the clinic last time with a punched out area on the left medial lower leg. Aggressive debridement last week we have been using Iodoflex. 7/8; using Iodoflex under compression. Still adherent debris requiring debridement I changed to Sorbact 7/22 using Sorbact under  compression. The wound is measuring smaller and the surface looks a lot better. My plan is been to change to collagen today however there was such an improvement I continued with the Sorbact 03/28/2020 upon evaluation today patient appears to be doing excellent in regard to her lower extremity. This is almost completely healed there is just a small pinpoint areas really to that are over the original wound location. This is almost completely closed and overall I am extremely pleased with where things stand. Objective Constitutional Well-nourished and well-hydrated in no acute distress. Vitals Time Taken: 3:37 PM, Height: 64 in, Weight: 197 lbs, BMI: 33.8, Temperature: 98.8 F, Pulse: 62 bpm, Respiratory Rate: 18 breaths/min, Blood Pressure: 128/74 mmHg. Respiratory normal breathing without difficulty. Psychiatric this patient is able to make decisions and demonstrates good insight into disease process. Alert and Oriented x 3. pleasant and cooperative. General Notes: It showed signs of excellent epithelization of does not appear to be any evidence of infection overall I think she is progressing quite nicely. She is very happy that this is doing so well and she is having no pain. Integumentary (Hair, Skin) Wound #1 status is Open. Original cause of wound was Trauma. The wound is located on the Left,Medial Lower Leg. The wound measures 0.1cm length  x 0.1cm width x 0.1cm depth; 0.008cm^2 area and 0.001cm^3 volume. There is Fat Layer (Subcutaneous Tissue) Exposed exposed. There is no tunneling or undermining noted. There is a small amount of serosanguineous drainage noted. The wound margin is flat and intact. There is large (67-100%) red granulation within the wound bed. There is no necrotic tissue within the wound bed. Assessment Active Problems ICD-10 Non-pressure chronic ulcer of left calf with fat layer exposed Chronic venous hypertension (idiopathic) with ulcer of left lower extremity Lymphedema, not elsewhere classified Procedures Wound #1 Pre-procedure diagnosis of Wound #1 is a Trauma, Other located on the Left,Medial Lower Leg . There was a Three Layer Compression Therapy Procedure by Levan Hurst, RN. Post procedure Diagnosis Wound #1: Same as Pre-Procedure Plan Follow-up Appointments: Return Appointment in 1 week. Dressing Change Frequency: Wound #1 Left,Medial Lower Leg: Do not change entire dressing for one week. Skin Barriers/Peri-Wound Care: Moisturizing lotion Wound Cleansing: May shower with protection. - use cast protector for showering and protection of wrap. Primary Wound Dressing: Wound #1 Left,Medial Lower Leg: Cutimed Sorbact - thin layer of hydrogel over sorbact Secondary Dressing: Wound #1 Left,Medial Lower Leg: Dry Gauze Edema Control: 3 Layer Compression System - Left Lower Extremity Avoid standing for long periods of time Elevate legs to the level of the heart or above for 30 minutes daily and/or when sitting, a frequency of: - throughout the day Exercise regularly 1. I would recommend currently that we go ahead and continue with the wound care measures as before specifically with regard to the Sorbact followed by a 3 layer compression wrap. 2. I would recommend she elevate her legs much as possible although I think she can be as active as she wants she is needs to make sure not to stand  too long for any period of time but fortunately in general she does not seem to swell that much. We will see patient back for reevaluation in 1 week here in the clinic. If anything worsens or changes patient will contact our office for additional recommendations. Electronic Signature(s) Signed: 03/28/2020 4:28:03 PM By: Worthy Keeler PA-C Entered By: Worthy Keeler on 03/28/2020 16:28:03 -------------------------------------------------------------------------------- SuperBill Details Patient Name: Date of Service: Maria NTE,  [place] RIA 03/28/2020 Medical Record Number: [id number] Patient Account Number: [id number] Date of Birth/Sex: Treating RN: May 15, 1956 (64 y.o. [name] Primary Care Provider: Rosita Fire D Other Clinician: Referring Provider: Treating Provider/Extender: Mackie Pai D Weeks in Treatment: 6 Diagnosis Coding ICD-10 Codes Code Description [phone] Non-pressure chronic ulcer of left calf with fat layer exposed I87.312 Chronic venous hypertension (idiopathic) with ulcer of left lower extremity I89.0 Lymphedema, not elsewhere classified Facility Procedures CPT4 Code: [id number] Description: (Facility Use Only) [phone] - [facility] LWR LT LEG Modifier: Quantity: 1 Physician Procedures : CPT4 Code Description Modifier [id number] - WC PHYS LEVEL 3 - EST PT ICD-10 Diagnosis Description L97.222 Non-pressure chronic ulcer of left calf with fat layer exposed I87.312 Chronic venous hypertension (idiopathic) with ulcer of left lower  extremity I89.0 Lymphedema, not elsewhere classified Quantity: 1 Electronic Signature(s) Signed: 03/28/2020 4:28:15 PM By: Worthy Keeler PA-C Entered By: Worthy Keeler on 03/28/2020 16:28:14

## 2020-03-29 NOTE — Progress Notes (Signed)
BHAWNA, BURDINE ([id number]) Visit Report for 03/28/2020 Arrival Information Details Patient Name: Date of Service: Chryl Heck, [place] RIA 03/28/2020 3:15 PM Medical Record Number: [id number] Patient Account Number: [id number] Date of Birth/Sex: Treating RN: 01-14-1956 (64 y.o. Freddy Finner Primary Care Kase Shughart: Avon Gully D Other Clinician: Referring Shaylene Paganelli: Treating Jashanti Clinkscale/Extender: Nelwyn Salisbury D Weeks in Treatment: 6 Visit Information History Since Last Visit All ordered tests and consults were completed: No Patient Arrived: [name] Added or deleted any medications: No Arrival Time: 15:37 Any new allergies or adverse reactions: No Accompanied By: self Had a fall or experienced change in No Transfer Assistance: None activities of daily living that may affect Patient Identification Verified: Yes risk of falls: Secondary Verification Process Completed: Yes Signs or symptoms of abuse/neglect since last visito No Patient Requires Transmission-Based Precautions: No Hospitalized since last visit: No Patient Has Alerts: Yes Implantable device outside of the clinic excluding No cellular tissue based products placed in the center since last visit: Has Dressing in Place as Prescribed: Yes Has Compression in Place as Prescribed: Yes Pain Present Now: No Electronic Signature(s) Signed: 03/29/2020 4:42:20 PM By: Yevonne Pax RN Entered By: Yevonne Pax on 03/28/2020 15:37:49 -------------------------------------------------------------------------------- Compression Therapy Details Patient Name: Date of Service: Chryl Heck, [place] RIA 03/28/2020 3:15 PM Medical Record Number: [id number] Patient Account Number: [id number] Date of Birth/Sex: Treating RN: 11/14/1955 (64 y.o. Wynelle Link Primary Care Beuford Garcilazo: Avon Gully D Other Clinician: Referring Obert Espindola: Treating Estevan Kersh/Extender: Nelwyn Salisbury D Weeks in Treatment: 6 Compression Therapy  Performed for Wound Assessment: Wound #1 Left,Medial Lower Leg Performed By: Clinician Zandra Abts, RN Compression Type: Three Layer Post Procedure Diagnosis Same as Pre-procedure Electronic Signature(s) Signed: 03/28/2020 5:21:32 PM By: Zandra Abts RN, BSN Entered By: Zandra Abts on 03/28/2020 16:21:43 -------------------------------------------------------------------------------- Encounter Discharge Information Details Patient Name: Date of Service: Chryl Heck, [place] RIA 03/28/2020 3:15 PM Medical Record Number: [id number] Patient Account Number: [id number] Date of Birth/Sex: Treating RN: [birth year] (64 y.o. [name] Primary Care Mcgregor Tinnon: Avon Gully D Other Clinician: Referring Corrion Stirewalt: Treating Imanol Bihl/Extender: Marvel Plan Weeks in Treatment: 6 Encounter Discharge Information Items Discharge Condition: Stable Ambulatory Status: Cane Discharge Destination: Home Transportation: Private Auto Accompanied By: self Schedule Follow-up Appointment: Yes Clinical Summary of Care: Patient Declined Electronic Signature(s) Signed: 03/28/2020 5:04:18 PM By: Zenaida Deed RN, BSN Entered By: Zenaida Deed on 03/28/2020 16:42:52 -------------------------------------------------------------------------------- Lower Extremity Assessment Details Patient Name: Date of Service: Chryl Heck, [place] RIA 03/28/2020 3:15 PM Medical Record Number: [id number] Patient Account Number: [id number] Date of Birth/Sex: Treating RN: [birth year] (64 y.o. Freddy Finner Primary Care Faviola Klare: Avon Gully D Other Clinician: Referring Caidyn Blossom: Treating Hamna Asa/Extender: Lilly [place], Tesfaye D Weeks in Treatment: 6 Edema Assessment Assessed: [Left: No] [Right: No] Edema: [Left: Ye] [Right: s] Calf Left: Right: Point of Measurement: 29 cm From Medial Instep 43 cm cm Ankle Left: Right: Point of Measurement: 10 cm From Medial Instep 24 cm cm Electronic  Signature(s) Signed: 03/29/2020 4:42:20 PM By: Yevonne Pax RN Entered By: Yevonne Pax on 03/28/2020 15:41:40 -------------------------------------------------------------------------------- Multi-Disciplinary Care Plan Details Patient Name: Date of Service: Chryl Heck, [place] RIA 03/28/2020 3:15 PM Medical Record Number: [id number] Patient Account Number: [id number] Date of Birth/Sex: Treating RN: 12/12/1955 (64 y.o. Wynelle Link Primary Care Jakeline Dave: Avon Gully D Other Clinician: Referring Kem Hensen: Treating Roseland Braun/Extender: Nelwyn Salisbury D Weeks in Treatment: 6 Active Inactive Wound/Skin Impairment Nursing Diagnoses: Knowledge deficit related to ulceration/compromised skin  integrity Goals: Patient/caregiver will verbalize understanding of skin care regimen Date Initiated: 02/15/2020 Target Resolution Date: 04/12/2020 Goal Status: Active Interventions: Assess patient/caregiver ability to perform ulcer/skin care regimen upon admission and as needed Assess ulceration(s) every visit Provide education on ulcer and skin care Treatment Activities: Skin care regimen initiated : 02/15/2020 Topical wound management initiated : 02/15/2020 Notes: Electronic Signature(s) Signed: 03/28/2020 5:21:32 PM By: Zandra Abts RN, BSN Entered By: Zandra Abts on 03/28/2020 17:11:54 -------------------------------------------------------------------------------- Pain Assessment Details Patient Name: Date of Service: Chryl Heck, [place] RIA 03/28/2020 3:15 PM Medical Record Number: [id number] Patient Account Number: [id number] Date of Birth/Sex: Treating RN: 23-Feb-1956 (64 y.o. Freddy Finner Primary Care Yves Fodor: Avon Gully D Other Clinician: Referring Kamaya Keckler: Treating Jonnatan Hanners/Extender: Nelwyn Salisbury D Weeks in Treatment: 6 Active Problems Location of Pain Severity and Description of Pain Patient Has Paino No Site Locations Pain Management and  Medication Current Pain Management: Electronic Signature(s) Signed: 03/29/2020 4:42:20 PM By: Yevonne Pax RN Entered By: Yevonne Pax on 03/28/2020 15:38:51 -------------------------------------------------------------------------------- Patient/Caregiver Education Details Patient Name: Date of Service: Chryl [place], [place] RIA 8/5/2021andnbsp3:15 PM Medical Record Number: [id number] Patient Account Number: [id number] Date of Birth/Gender: Treating RN: [birth year] (64 y.o. Wynelle Link Primary Care Physician: Avon Gully D Other Clinician: Referring Physician: Treating Physician/Extender: Marvel Plan Weeks in Treatment: 6 Education Assessment Education Provided To: Patient Education Topics Provided Wound/Skin Impairment: Methods: Explain/Verbal Responses: State content correctly Electronic Signature(s) Signed: 03/28/2020 5:21:32 PM By: Zandra Abts RN, BSN Entered By: Zandra Abts on 03/28/2020 17:12:04 -------------------------------------------------------------------------------- Wound Assessment Details Patient Name: Date of Service: Chryl [place], [place] RIA 03/28/2020 3:15 PM Medical Record Number: [id number] Patient Account Number: [id number] Date of Birth/Sex: Treating RN: [birth year] (63 y.o. Freddy Finner Primary Care Alecia Doi: Avon Gully D Other Clinician: Referring October Peery: Treating Charda Janis/Extender: Nelwyn Salisbury D Weeks in Treatment: 6 Wound Status Wound Number: 1 Primary Etiology: Trauma, Other Wound Location: Left, Medial Lower Leg Wound Status: Open Wounding Event: Trauma Comorbid History: Arrhythmia, Hypertension Date Acquired: 11/02/2019 Weeks Of Treatment: 6 Clustered Wound: No Wound Measurements Length: (cm) 0.1 Width: (cm) 0.1 Depth: (cm) 0.1 Area: (cm) 0.008 Volume: (cm) 0.001 Wound Description Classification: Full Thickness Without Exposed Support Struc Wound Margin: Flat and Intact Exudate  Amount: Small Exudate Type: Serosanguineous Exudate Color: red, brown Foul Odor After Cleansing: Slough/Fibrino % Reduction in Area: 99.4% % Reduction in Volume: 99.3% Epithelialization: Small (1-33%) Tunneling: No Undermining: No tures No Yes Wound Bed Granulation Amount: Large (67-100%) Exposed Structure Granulation Quality: Red Fascia Exposed: No Necrotic Amount: None Present (0%) Fat Layer (Subcutaneous Tissue) Exposed: Yes Tendon Exposed: No Muscle Exposed: No Joint Exposed: No Bone Exposed: No Treatment Notes Wound #1 (Left, Medial Lower Leg) 2. Periwound Care Moisturizing lotion 3. Primary Dressing Applied Hydrogel or K-Y Jelly Other primary dressing (specifiy in notes) 4. Secondary Dressing Dry Gauze 6. Support Layer Applied 3 layer compression wrap Notes sorbact, netting Electronic Signature(s) Signed: 03/29/2020 4:42:20 PM By: Yevonne Pax RN Entered By: Yevonne Pax on 03/28/2020 15:57:00 -------------------------------------------------------------------------------- Vitals Details Patient Name: Date of Service: Chryl Heck, [place] RIA 03/28/2020 3:15 PM Medical Record Number: [id number] Patient Account Number: [id number] Date of Birth/Sex: Treating RN: 07/11/1956 (64 y.o. Freddy Finner Primary Care Hurshel Bouillon: Avon Gully D Other Clinician: Referring Mitsugi Schrader: Treating Amand Lemoine/Extender: Nelwyn Salisbury D Weeks in Treatment: 6 Vital Signs Time Taken: 15:37 Temperature (F): 98.8 Height (in): 64 Pulse (bpm): 62 Weight (lbs): 197 Respiratory Rate (breaths/min): 18  Body Mass Index (BMI): 33.8 Blood Pressure (mmHg): 128/74 Reference Range: 80 - 120 mg / dl Electronic Signature(s) Signed: 03/29/2020 4:42:20 PM By: Yevonne Pax RN Entered By: Yevonne Pax on 03/28/2020 15:38:39

## 2020-04-02 ENCOUNTER — Ambulatory Visit: Payer: Medicaid Other | Admitting: Orthopedic Surgery

## 2020-04-04 ENCOUNTER — Encounter (HOSPITAL_BASED_OUTPATIENT_CLINIC_OR_DEPARTMENT_OTHER): Payer: Medicaid Other | Admitting: Internal Medicine

## 2020-04-04 ENCOUNTER — Other Ambulatory Visit: Payer: Self-pay

## 2020-04-04 DIAGNOSIS — S80812A Abrasion, left lower leg, initial encounter: Secondary | ICD-10-CM | POA: Diagnosis not present

## 2020-04-05 NOTE — Progress Notes (Signed)
AMARIS, DELAFUENTE ([id number]) Visit Report for 04/04/2020 HPI Details Patient Name: Date of Service: Maria Murphy, [place] RIA 04/04/2020 9:45 A M Medical Record Number: [id number] Patient Account Number: [id number] Date of Birth/Sex: Treating RN: [birth year] (64 y.o. [name] Primary Care Provider: Rosita Fire D Other Clinician: Referring Provider: Treating Provider/Extender: Lelon Mast D Weeks in Treatment: 7 History of Present Illness HPI Description: ADMISSION 02/15/2020 This is a 64 year old woman who is a type II diabetic on oral agents. She states that 3 months ago a friend's cat scratched her on the medial left lower extremity. This opened into a wound she has not been able to get this to close. She has largely been using topical antibiotic ointments of various description over-the-counter. She saw Dr. Legrand Rams who is her primary doctor on 6/10 she was given a course of doxycycline which she is finished. Past medical history includes knee pain bilaterally secondary to osteoarthritis, hypertension, type 2 diabetes, probable diabetic neuropathy ABI was 0.96 on the left 7/1; this is a patient with lymphedema. This was initially a cat scratch injury. She came into the clinic last time with a punched out area on the left medial lower leg. Aggressive debridement last week we have been using Iodoflex. 7/8; using Iodoflex under compression. Still adherent debris requiring debridement I changed to Sorbact 7/22 using Sorbact under compression. The wound is measuring smaller and the surface looks a lot better. My plan is been to change to collagen today however there was such an improvement I continued with the Sorbact 03/28/2020 upon evaluation today patient appears to be doing excellent in regard to her lower extremity. This is almost completely healed there is just a small pinpoint areas really to that are over the original wound location. This is almost completely closed and  overall I am extremely pleased with where things stand. 04/04/20-Patient returns at 1 week, the wound on the left left lower extremity has closed Electronic Signature(s) Signed: 04/04/2020 9:57:58 AM By: Tobi Bastos MD, MBA Entered By: Tobi Bastos on 04/04/2020 09:57:57 -------------------------------------------------------------------------------- Physical Exam Details Patient Name: Date of Service: Maria Murphy, [place] RIA 04/04/2020 9:45 A M Medical Record Number: [id number] Patient Account Number: [id number] Date of Birth/Sex: Treating RN: [birth year] (64 y.o. [name] Primary Care Provider: Rosita Fire D Other Clinician: Referring Provider: Treating Provider/Extender: Lelon Mast D Weeks in Treatment: 7 Constitutional alert and oriented x 3. sitting or standing blood pressure is within target range for patient.. supine blood pressure is within target range for patient.. pulse regular and within target range for patient.Marland Kitchen respirations regular, non-labored and within target range for patient.Marland Kitchen temperature within target range for patient.. . . Well- nourished and well-hydrated in no acute distress. Notes Left lower extremity medial wound has closed Electronic Signature(s) Signed: 04/04/2020 9:58:17 AM By: Tobi Bastos MD, MBA Entered By: Tobi Bastos on 04/04/2020 09:58:17 -------------------------------------------------------------------------------- Physician Orders Details Patient Name: Date of Service: Maria Murphy, [place] RIA 04/04/2020 9:45 A M Medical Record Number: [id number] Patient Account Number: [id number] Date of Birth/Sex: Treating RN: 1956-05-31 (64 y.o. [name] Primary Care Provider: Rosita Fire D Other Clinician: Referring Provider: Treating Provider/Extender: Lelon Mast D Weeks in Treatment: 7 Verbal / Phone Orders: No Diagnosis Coding ICD-10 Coding Code Description [phone] Non-pressure chronic  ulcer of left calf with fat layer exposed I87.312 Chronic venous hypertension (idiopathic) with ulcer of left lower extremity I89.0 Lymphedema, not elsewhere classified Discharge From [facility] Services Discharge from [place] Signature(s)  Signed: 04/04/2020 5:03:09 PM By: Tobi Bastos MD, MBA Signed: 04/05/2020 5:19:54 PM By: Levan Hurst RN, BSN Entered By: Levan Hurst on 04/04/2020 09:57:16 -------------------------------------------------------------------------------- Problem List Details Patient Name: Date of Service: Maria Murphy, [place] RIA 04/04/2020 9:45 A M Medical Record Number: [id number] Patient Account Number: [id number] Date of Birth/Sex: Treating RN: May 03, 1956 (64 y.o. [name] Primary Care Provider: Rosita Fire D Other Clinician: Referring Provider: Treating Provider/Extender: Lelon Mast D Weeks in Treatment: 7 Active Problems ICD-10 Encounter Code Description Active Date MDM Diagnosis L97.222 Non-pressure chronic ulcer of left calf with fat layer exposed 02/15/2020 No Yes I87.312 Chronic venous hypertension (idiopathic) with ulcer of left lower extremity 02/15/2020 No Yes I89.0 Lymphedema, not elsewhere classified 02/15/2020 No Yes Inactive Problems Resolved Problems Electronic Signature(s) Signed: 04/04/2020 5:03:09 PM By: Tobi Bastos MD, MBA Signed: 04/05/2020 5:19:54 PM By: Levan Hurst RN, BSN Entered By: Levan Hurst on 04/04/2020 09:54:59 -------------------------------------------------------------------------------- Progress Note Details Patient Name: Date of Service: Maria Murphy, [place] RIA 04/04/2020 9:45 A M Medical Record Number: [id number] Patient Account Number: [id number] Date of Birth/Sex: Treating RN: 1956/06/14 (64 y.o. [name] Primary Care Provider: Rosita Fire D Other Clinician: Referring Provider: Treating Provider/Extender: Lelon Mast D Weeks in Treatment:  7 Subjective History of Present Illness (HPI) ADMISSION 02/15/2020 This is a 64 year old woman who is a type II diabetic on oral agents. She states that 3 months ago a friend's cat scratched her on the medial left lower extremity. This opened into a wound she has not been able to get this to close. She has largely been using topical antibiotic ointments of various description over-the-counter. She saw Dr. Legrand Rams who is her primary doctor on 6/10 she was given a course of doxycycline which she is finished. Past medical history includes knee pain bilaterally secondary to osteoarthritis, hypertension, type 2 diabetes, probable diabetic neuropathy ABI was 0.96 on the left 7/1; this is a patient with lymphedema. This was initially a cat scratch injury. She came into the clinic last time with a punched out area on the left medial lower leg. Aggressive debridement last week we have been using Iodoflex. 7/8; using Iodoflex under compression. Still adherent debris requiring debridement I changed to Sorbact 7/22 using Sorbact under compression. The wound is measuring smaller and the surface looks a lot better. My plan is been to change to collagen today however there was such an improvement I continued with the Sorbact 03/28/2020 upon evaluation today patient appears to be doing excellent in regard to her lower extremity. This is almost completely healed there is just a small pinpoint areas really to that are over the original wound location. This is almost completely closed and overall I am extremely pleased with where things stand. 04/04/20-Patient returns at 1 week, the wound on the left left lower extremity has closed Objective Constitutional alert and oriented x 3. sitting or standing blood pressure is within target range for patient.. supine blood pressure is within target range for patient.. pulse regular and within target range for patient.Marland Kitchen respirations regular, non-labored and within target range for  patient.Marland Kitchen temperature within target range for patient.. Well- nourished and well-hydrated in no acute distress. Vitals Time Taken: 9:18 AM, Height: 64 in, Weight: 197 lbs, BMI: 33.8, Temperature: 98.1 F, Pulse: 73 bpm, Respiratory Rate: 18 breaths/min, Blood Pressure: 116/78 mmHg. General Notes: Left lower extremity medial wound has closed Integumentary (Hair, Skin) Wound #1 status is Healed - Epithelialized. Original cause of wound was Trauma. The wound  is located on the Left,Medial Lower Leg. The wound measures 0cm length x 0cm width x 0cm depth; 0cm^2 area and 0cm^3 volume. There is no tunneling or undermining noted. There is a none present amount of drainage noted. The wound margin is distinct with the outline attached to the wound base. There is no granulation within the wound bed. There is no necrotic tissue within the wound bed. Assessment Active Problems ICD-10 Non-pressure chronic ulcer of left calf with fat layer exposed Chronic venous hypertension (idiopathic) with ulcer of left lower extremity Lymphedema, not elsewhere classified Plan Discharge From [facility] Services: Discharge from [place] -The left lower extremity distal wound is closed, patient will be discharged in the clinic -Patient has very little edema in the legs and does not need any compressive devices at this time Electronic Signature(s) Signed: 04/04/2020 9:58:54 AM By: Tobi Bastos MD, MBA Entered By: Tobi Bastos on 04/04/2020 09:58:54 -------------------------------------------------------------------------------- SuperBill Details Patient Name: Date of Service: Maria Mirza, MA [place] 04/04/2020 Medical Record Number: [id number] Patient Account Number: [id number] Date of Birth/Sex: Treating RN: 1955-09-30 (64 y.o. [name] Primary Care Provider: Rosita Fire D Other Clinician: Referring Provider: Treating Provider/Extender: Lelon Mast D Weeks in Treatment:  7 Diagnosis Coding ICD-10 Codes Code Description [phone] Non-pressure chronic ulcer of left calf with fat layer exposed I87.312 Chronic venous hypertension (idiopathic) with ulcer of left lower extremity I89.0 Lymphedema, not elsewhere classified Facility Procedures CPT4 Code: 88828003 Description: 99213 - WOUND CARE VISIT-LEV 3 EST PT Modifier: Quantity: 1 Physician Procedures : CPT4 Code Description Modifier [id number] - WC PHYS LEVEL 2 - EST PT ICD-10 Diagnosis Description L97.222 Non-pressure chronic ulcer of left calf with fat layer exposed Quantity: 1 Electronic Signature(s) Signed: 04/04/2020 5:03:09 PM By: Tobi Bastos MD, MBA Signed: 04/05/2020 5:19:54 PM By: Levan Hurst RN, BSN Previous Signature: 04/04/2020 10:00:03 AM Version By: Tobi Bastos MD, MBA Entered By: Levan Hurst on 04/04/2020 10:01:05

## 2020-04-05 NOTE — Progress Notes (Signed)
5 []  - 0 Complex Wound Cleansing - multiple wounds X- 1 5 Wound Imaging (photographs - any number of wounds) []  - 0 Wound Tracing (instead of photographs) X- 1 5 Simple Wound Measurement - one wound []  - 0 Complex Wound Measurement - multiple wounds INTERVENTIONS - Wound Dressings []  - 0 Small Wound Dressing one or multiple wounds []  - 0 Medium Wound Dressing one or multiple wounds []  - 0 Large Wound Dressing one or multiple wounds []  - 0 Application of Medications - topical []  - 0 Application of Medications - injection INTERVENTIONS - Miscellaneous []  - 0 External ear exam []  - 0 Specimen Collection (cultures, biopsies, blood, body fluids, etc.) []  - 0 Specimen(s) / Culture(s) sent or taken to Lab for analysis []  - 0 Patient Transfer (multiple staff / Civil Service fast streamer / Similar devices) []  - 0 Simple Staple / Suture removal (25 or less) []  - 0 Complex Staple / Suture removal (26 or more) []  - 0 Hypo / Hyperglycemic Management (close monitor of Blood Glucose) []  - 0 Ankle / Brachial Index (ABI) - do not check if billed separately X- 1 5 Vital Signs Has the patient been seen at the hospital within the last three years: Yes Total Score: 80 Level Of Care: New/Established - Level 3 Electronic Signature(s) Signed: 04/05/2020 5:19:54 PM By: Levan Hurst RN, BSN Entered By: Levan Hurst on 04/04/2020 10:00:54 -------------------------------------------------------------------------------- Lower Extremity Assessment Details Patient Name: Date of Service: Maria Murphy, [place] RIA 04/04/2020 9:45 A M Medical Record Number: [id number] Patient Account Number: [id number] Date of Birth/Sex: Treating RN: [birth year] (64 y.o. Clearnce Sorrel Primary Care Sudiksha Victor: Rosita Fire D Other Clinician: Referring  Karyme Mcconathy: Treating Marcie Shearon/Extender: Lelon Mast D Weeks in Treatment: 7 Edema Assessment Assessed: [Left: No] [Right: No] Edema: [Left: Ye] [Right: s] Calf Left: Right: Point of Measurement: 29 cm From Medial Instep 43 cm cm Ankle Left: Right: Point of Measurement: 10 cm From Medial Instep 24 cm cm Vascular Assessment Pulses: Dorsalis Pedis Palpable: [Left:Yes] Electronic Signature(s) Signed: 04/04/2020 5:05:13 PM By: Kela Millin Entered By: Kela Millin on 04/04/2020 09:20:56 -------------------------------------------------------------------------------- Multi-Disciplinary Care Plan Details Patient Name: Date of Service: Maria Murphy, [place] RIA 04/04/2020 9:45 A M Medical Record Number: [id number] Patient Account Number: [id number] Date of Birth/Sex: Treating RN: 1956/07/05 (64 y.o. [name] Primary Care Atleigh Gruen: Rosita Fire D Other Clinician: Referring Ainslie Mazurek: Treating Jamarien Rodkey/Extender: Lelon Mast D Weeks in Treatment: 7 Active Inactive Electronic Signature(s) Signed: 04/05/2020 5:19:54 PM By: Levan Hurst RN, BSN Entered By: Levan Hurst on 04/04/2020 10:00:21 -------------------------------------------------------------------------------- Pain Assessment Details Patient Name: Date of Service: Maria Murphy, [place] RIA 04/04/2020 9:45 A M Medical Record Number: [id number] Patient Account Number: [id number] Date of Birth/Sex: Treating RN: 06/11/1956 (64 y.o. Clearnce Sorrel Primary Care Tansy Lorek: Rosita Fire D Other Clinician: Referring Quanetta Truss: Treating Tonimarie Gritz/Extender: Lelon Mast D Weeks in Treatment: 7 Active Problems Location of Pain Severity and Description of Pain Patient Has Paino No Site Locations Pain Management and Medication Current Pain Management: Electronic Signature(s) Signed: 04/04/2020 5:05:13 PM By: Kela Millin Entered By: Kela Millin on  04/04/2020 09:20:46 -------------------------------------------------------------------------------- Patient/Caregiver Education Details Patient Name: Date of Service: Maria Mirza, [place] RIA 8/12/2021andnbsp9:45 [place] Record Number: [id number] Patient Account Number: [id number] Date of Birth/Gender: Treating RN: [birth year] (64 y.o. [name] Primary Care Physician: Rosita Fire D Other Clinician: Referring Physician: Treating Physician/Extender: Tonna Corner Weeks in Treatment: 7 Education Assessment Education Provided To: Patient Education Topics  BRITTIANY, WIEHE ([id number]) Visit Report for 04/04/2020 Arrival Information Details Patient Name: Date of Service: Maria Murphy, [place] RIA 04/04/2020 9:45 A M Medical Record Number: [id number] Patient Account Number: [id number] Date of Birth/Sex: Treating RN: Jun 21, 1956 (64 y.o. Clearnce Sorrel Primary Care Liliyana Thobe: Rosita Fire D Other Clinician: Referring Emrey Thornley: Treating Daeveon Zweber/Extender: Lelon Mast D Weeks in Treatment: 7 Visit Information History Since Last Visit Added or deleted any medications: No Patient Arrived: [name] Any new allergies or adverse reactions: No Arrival Time: 09:18 Had a fall or experienced change in No Accompanied By: self activities of daily living that may affect Transfer Assistance: None risk of falls: Patient Identification Verified: Yes Signs or symptoms of abuse/neglect since last visito No Secondary Verification Process Completed: Yes Hospitalized since last visit: No Patient Requires Transmission-Based Precautions: No Implantable device outside of the clinic excluding No Patient Has Alerts: Yes cellular tissue based products placed in the center since last visit: Has Dressing in Place as Prescribed: Yes Has Compression in Place as Prescribed: Yes Pain Present Now: No Electronic Signature(s) Signed: 04/04/2020 5:05:13 PM By: Kela Millin Entered By: Kela Millin on 04/04/2020 09:18:55 -------------------------------------------------------------------------------- Clinic Level of Care Assessment Details Patient Name: Date of Service: Maria Murphy, [place] RIA 04/04/2020 9:45 A M Medical Record Number: [id number] Patient Account Number: [id number] Date of Birth/Sex: Treating RN: 02-03-1956 (64 y.o. [name] Primary Care Hoa Deriso: Rosita Fire D Other Clinician: Referring Tressie Ragin: Treating Marthe Dant/Extender: Lelon Mast D Weeks in Treatment: 7 Clinic Level of Care Assessment Items TOOL 4  Quantity Score X- 1 0 Use when only an EandM is performed on FOLLOW-UP visit ASSESSMENTS - Nursing Assessment / Reassessment X- 1 10 Reassessment of Co-morbidities (includes updates in patient status) X- 1 5 Reassessment of Adherence to Treatment Plan ASSESSMENTS - Wound and Skin A ssessment / Reassessment X - Simple Wound Assessment / Reassessment - one wound 1 5 []  - 0 Complex Wound Assessment / Reassessment - multiple wounds []  - 0 Dermatologic / Skin Assessment (not related to wound area) ASSESSMENTS - Focused Assessment []  - 0 Circumferential Edema Measurements - multi extremities []  - 0 Nutritional Assessment / Counseling / Intervention X- 1 5 Lower Extremity Assessment (monofilament, tuning fork, pulses) []  - 0 Peripheral Arterial Disease Assessment (using hand held doppler) ASSESSMENTS - Ostomy and/or Continence Assessment and Care []  - 0 Incontinence Assessment and Management []  - 0 Ostomy Care Assessment and Management (repouching, etc.) PROCESS - Coordination of Care X - Simple Patient / Family Education for ongoing care 1 15 []  - 0 Complex (extensive) Patient / Family Education for ongoing care X- 1 10 Staff obtains Programmer, systems, Records, T Results / Process Orders est []  - 0 Staff telephones HHA, Nursing Homes / Clarify orders / etc []  - 0 Routine Transfer to another Facility (non-emergent condition) []  - 0 Routine Hospital Admission (non-emergent condition) []  - 0 New Admissions / Biomedical engineer / Ordering NPWT Apligraf, etc. , []  - 0 Emergency Hospital Admission (emergent condition) X- 1 10 Simple Discharge Coordination []  - 0 Complex (extensive) Discharge Coordination PROCESS - Special Needs []  - 0 Pediatric / Minor Patient Management []  - 0 Isolation Patient Management []  - 0 Hearing / Language / Visual special needs []  - 0 Assessment of Community assistance (transportation, D/C planning, etc.) []  - 0 Additional assistance / Altered  mentation []  - 0 Support Surface(s) Assessment (bed, cushion, seat, etc.) INTERVENTIONS - Wound Cleansing / Measurement X - Simple Wound Cleansing - one wound 1  5 []  - 0 Complex Wound Cleansing - multiple wounds X- 1 5 Wound Imaging (photographs - any number of wounds) []  - 0 Wound Tracing (instead of photographs) X- 1 5 Simple Wound Measurement - one wound []  - 0 Complex Wound Measurement - multiple wounds INTERVENTIONS - Wound Dressings []  - 0 Small Wound Dressing one or multiple wounds []  - 0 Medium Wound Dressing one or multiple wounds []  - 0 Large Wound Dressing one or multiple wounds []  - 0 Application of Medications - topical []  - 0 Application of Medications - injection INTERVENTIONS - Miscellaneous []  - 0 External ear exam []  - 0 Specimen Collection (cultures, biopsies, blood, body fluids, etc.) []  - 0 Specimen(s) / Culture(s) sent or taken to Lab for analysis []  - 0 Patient Transfer (multiple staff / Civil Service fast streamer / Similar devices) []  - 0 Simple Staple / Suture removal (25 or less) []  - 0 Complex Staple / Suture removal (26 or more) []  - 0 Hypo / Hyperglycemic Management (close monitor of Blood Glucose) []  - 0 Ankle / Brachial Index (ABI) - do not check if billed separately X- 1 5 Vital Signs Has the patient been seen at the hospital within the last three years: Yes Total Score: 80 Level Of Care: New/Established - Level 3 Electronic Signature(s) Signed: 04/05/2020 5:19:54 PM By: Levan Hurst RN, BSN Entered By: Levan Hurst on 04/04/2020 10:00:54 -------------------------------------------------------------------------------- Lower Extremity Assessment Details Patient Name: Date of Service: Maria Murphy, [place] RIA 04/04/2020 9:45 A M Medical Record Number: [id number] Patient Account Number: [id number] Date of Birth/Sex: Treating RN: [birth year] (64 y.o. Clearnce Sorrel Primary Care Sudiksha Victor: Rosita Fire D Other Clinician: Referring  Karyme Mcconathy: Treating Marcie Shearon/Extender: Lelon Mast D Weeks in Treatment: 7 Edema Assessment Assessed: [Left: No] [Right: No] Edema: [Left: Ye] [Right: s] Calf Left: Right: Point of Measurement: 29 cm From Medial Instep 43 cm cm Ankle Left: Right: Point of Measurement: 10 cm From Medial Instep 24 cm cm Vascular Assessment Pulses: Dorsalis Pedis Palpable: [Left:Yes] Electronic Signature(s) Signed: 04/04/2020 5:05:13 PM By: Kela Millin Entered By: Kela Millin on 04/04/2020 09:20:56 -------------------------------------------------------------------------------- Multi-Disciplinary Care Plan Details Patient Name: Date of Service: Maria Murphy, [place] RIA 04/04/2020 9:45 A M Medical Record Number: [id number] Patient Account Number: [id number] Date of Birth/Sex: Treating RN: 1956/07/05 (64 y.o. [name] Primary Care Atleigh Gruen: Rosita Fire D Other Clinician: Referring Ainslie Mazurek: Treating Jamarien Rodkey/Extender: Lelon Mast D Weeks in Treatment: 7 Active Inactive Electronic Signature(s) Signed: 04/05/2020 5:19:54 PM By: Levan Hurst RN, BSN Entered By: Levan Hurst on 04/04/2020 10:00:21 -------------------------------------------------------------------------------- Pain Assessment Details Patient Name: Date of Service: Maria Murphy, [place] RIA 04/04/2020 9:45 A M Medical Record Number: [id number] Patient Account Number: [id number] Date of Birth/Sex: Treating RN: 06/11/1956 (64 y.o. Clearnce Sorrel Primary Care Tansy Lorek: Rosita Fire D Other Clinician: Referring Quanetta Truss: Treating Tonimarie Gritz/Extender: Lelon Mast D Weeks in Treatment: 7 Active Problems Location of Pain Severity and Description of Pain Patient Has Paino No Site Locations Pain Management and Medication Current Pain Management: Electronic Signature(s) Signed: 04/04/2020 5:05:13 PM By: Kela Millin Entered By: Kela Millin on  04/04/2020 09:20:46 -------------------------------------------------------------------------------- Patient/Caregiver Education Details Patient Name: Date of Service: Maria Mirza, [place] R[place] 8/12/2021andnbsp9:45 [place] Record Number: [id number] Patient Account Number: [id number] Date of Birth/Gender: Treating RN: [birth year] (64 y.o. [name] Primary Care Physician: Rosita Fire D Other Clinician: Referring Physician: Treating Physician/Extender: Tonna Corner Weeks in Treatment: 7 Education Assessment Education Provided To: Patient Education Topics

## 2020-04-20 ENCOUNTER — Other Ambulatory Visit: Payer: Self-pay

## 2020-04-20 ENCOUNTER — Emergency Department (HOSPITAL_COMMUNITY): Payer: Medicaid Other

## 2020-04-20 ENCOUNTER — Emergency Department (HOSPITAL_COMMUNITY)
Admission: EM | Admit: 2020-04-20 | Discharge: 2020-04-21 | Disposition: A | Discharge status: Home / Self Care | Payer: Medicaid Other | Source: Home / Self Care | Attending: Emergency Medicine | Admitting: Emergency Medicine

## 2020-04-20 DIAGNOSIS — Z79899 Other long term (current) drug therapy: Secondary | ICD-10-CM | POA: Insufficient documentation

## 2020-04-20 DIAGNOSIS — R200 Anesthesia of skin: Secondary | ICD-10-CM

## 2020-04-20 DIAGNOSIS — R109 Unspecified abdominal pain: Secondary | ICD-10-CM | POA: Insufficient documentation

## 2020-04-20 DIAGNOSIS — R202 Paresthesia of skin: Secondary | ICD-10-CM | POA: Insufficient documentation

## 2020-04-20 DIAGNOSIS — I10 Essential (primary) hypertension: Secondary | ICD-10-CM | POA: Insufficient documentation

## 2020-04-20 DIAGNOSIS — Z20822 Contact with and (suspected) exposure to covid-19: Secondary | ICD-10-CM | POA: Insufficient documentation

## 2020-04-20 LAB — URINALYSIS, ROUTINE W REFLEX MICROSCOPIC
Glucose, UA: NEGATIVE mg/dL
Hgb urine dipstick: NEGATIVE
Ketones, ur: NEGATIVE mg/dL
Leukocytes,Ua: NEGATIVE
Nitrite: NEGATIVE
Protein, ur: NEGATIVE mg/dL
Specific Gravity, Urine: 1.03 (ref 1.005–1.030)
pH: 5 (ref 5.0–8.0)

## 2020-04-20 LAB — COMPREHENSIVE METABOLIC PANEL
ALT: 9 U/L (ref 0–44)
AST: 22 U/L (ref 15–41)
Albumin: 4.2 g/dL (ref 3.5–5.0)
Alkaline Phosphatase: 37 U/L — ABNORMAL LOW (ref 38–126)
Anion gap: 14 (ref 5–15)
BUN: 24 mg/dL — ABNORMAL HIGH (ref 8–23)
CO2: 22 mmol/L (ref 22–32)
Calcium: 8.8 mg/dL — ABNORMAL LOW (ref 8.9–10.3)
Chloride: 105 mmol/L (ref 98–111)
Creatinine, Ser: 1.31 mg/dL — ABNORMAL HIGH (ref 0.44–1.00)
GFR calc Af Amer: 50 mL/min — ABNORMAL LOW (ref >60)
GFR calc non Af Amer: 43 mL/min — ABNORMAL LOW (ref >60)
Glucose, Bld: 97 mg/dL (ref 70–99)
Potassium: 4.1 mmol/L (ref 3.5–5.1)
Sodium: 141 mmol/L (ref 135–145)
Total Bilirubin: 3 mg/dL — ABNORMAL HIGH (ref 0.3–1.2)
Total Protein: 7.3 g/dL (ref 6.5–8.1)

## 2020-04-20 LAB — CBC WITH DIFFERENTIAL/PLATELET
Abs Immature Granulocytes: 0.01 10*3/uL (ref 0.00–0.07)
Basophils Absolute: 0 10*3/uL (ref 0.0–0.1)
Basophils Relative: 0 %
Eosinophils Absolute: 0.2 10*3/uL (ref 0.0–0.5)
Eosinophils Relative: 2 %
HCT: 37.1 % (ref 36.0–46.0)
Hemoglobin: 12.4 g/dL (ref 12.0–15.0)
Immature Granulocytes: 0 %
Lymphocytes Relative: 20 %
Lymphs Abs: 1.5 10*3/uL (ref 0.7–4.0)
MCH: 29.8 pg (ref 26.0–34.0)
MCHC: 33.4 g/dL (ref 30.0–36.0)
MCV: 89.2 fL (ref 80.0–100.0)
Monocytes Absolute: 0.7 10*3/uL (ref 0.1–1.0)
Monocytes Relative: 9 %
Neutro Abs: 5.2 10*3/uL (ref 1.7–7.7)
Neutrophils Relative %: 69 %
Platelets: 173 10*3/uL (ref 150–400)
RBC: 4.16 MIL/uL (ref 3.87–5.11)
RDW: 13.9 % (ref 11.5–15.5)
WBC: 7.5 10*3/uL (ref 4.0–10.5)
nRBC: 0 % (ref 0.0–0.2)

## 2020-04-20 LAB — CBG MONITORING, ED: Glucose-Capillary: 96 mg/dL (ref 70–99)

## 2020-04-20 LAB — BLOOD GAS, ARTERIAL
Acid-Base Excess: 1.3 mmol/L (ref 0.0–2.0)
Bicarbonate: 25.7 mmol/L (ref 20.0–28.0)
Drawn by: 22223
FIO2: 21
O2 Saturation: 97.2 %
Patient temperature: 37
pCO2 arterial: 37.7 mmHg (ref 32.0–48.0)
pH, Arterial: 7.439 (ref 7.350–7.450)
pO2, Arterial: 103 mmHg (ref 83.0–108.0)

## 2020-04-20 LAB — LACTIC ACID, PLASMA: Lactic Acid, Venous: 1.4 mmol/L (ref 0.5–1.9)

## 2020-04-20 LAB — AMMONIA: Ammonia: 36 umol/L — ABNORMAL HIGH (ref 9–35)

## 2020-04-20 LAB — TROPONIN I (HIGH SENSITIVITY): Troponin I (High Sensitivity): 2 ng/L (ref <18)

## 2020-04-20 LAB — SARS CORONAVIRUS 2 BY RT PCR (HOSPITAL ORDER, PERFORMED IN ~~LOC~~ HOSPITAL LAB): SARS Coronavirus 2: NEGATIVE

## 2020-04-20 MED ORDER — IOHEXOL 300 MG/ML  SOLN
100.0000 mL | Freq: Once | INTRAMUSCULAR | Status: AC | PRN
  Administered 2020-04-20: 75 mL via INTRAVENOUS

## 2020-04-20 NOTE — Discharge Instructions (Signed)
Please have your family doctor recheck your kidney function, today your creatinine was 1.3, please also have them check your bilirubin level which was 3.  I would encourage you to follow-up with the brain specialist, Dr. Merlene Laughter, he can help to find out more about the numbness that you are having.  Your testing today was otherwise unremarkable, you have improved significantly and looks like you are back to baseline.  Please seek medical exam for any severe worsening symptoms in the emergency department but have your family doctor recheck you within the week.
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
## 2020-04-20 NOTE — ED Notes (Signed)
Pt to CT

## 2020-04-20 NOTE — ED Notes (Signed)
Pt placed on purewick 

## 2020-04-20 NOTE — ED Triage Notes (Signed)
PT from home via [facility]. Pt called EMS for "stroke." Per EMS pt was ambulatory to the front door of the home. When asking pt question during triaging pt not responding. When attempting to cut patient clothes pt stated "don't cut my clothes." Pt  then sat up in the bed unassisted.

## 2020-04-21 ENCOUNTER — Encounter (HOSPITAL_COMMUNITY): Payer: Self-pay | Admitting: Emergency Medicine

## 2020-04-21 ENCOUNTER — Inpatient Hospital Stay (HOSPITAL_COMMUNITY)
Admission: EM | Admit: 2020-04-21 | Discharge: 2020-05-17 | DRG: 326 | Disposition: A | Discharge status: Home Health | Payer: Medicaid Other | Attending: Family Medicine | Admitting: Family Medicine

## 2020-04-21 ENCOUNTER — Other Ambulatory Visit: Payer: Self-pay

## 2020-04-21 DIAGNOSIS — Y838 Other surgical procedures as the cause of abnormal reaction of the patient, or of later complication, without mention of misadventure at the time of the procedure: Secondary | ICD-10-CM | POA: Diagnosis not present

## 2020-04-21 DIAGNOSIS — Z6838 Body mass index (BMI) 38.0-38.9, adult: Secondary | ICD-10-CM

## 2020-04-21 DIAGNOSIS — F419 Anxiety disorder, unspecified: Secondary | ICD-10-CM | POA: Diagnosis present

## 2020-04-21 DIAGNOSIS — F329 Major depressive disorder, single episode, unspecified: Secondary | ICD-10-CM | POA: Diagnosis present

## 2020-04-21 DIAGNOSIS — R1013 Epigastric pain: Secondary | ICD-10-CM | POA: Diagnosis present

## 2020-04-21 DIAGNOSIS — K275 Chronic or unspecified peptic ulcer, site unspecified, with perforation: Secondary | ICD-10-CM

## 2020-04-21 DIAGNOSIS — R1012 Left upper quadrant pain: Secondary | ICD-10-CM

## 2020-04-21 DIAGNOSIS — E876 Hypokalemia: Secondary | ICD-10-CM | POA: Diagnosis not present

## 2020-04-21 DIAGNOSIS — R062 Wheezing: Secondary | ICD-10-CM

## 2020-04-21 DIAGNOSIS — N281 Cyst of kidney, acquired: Secondary | ICD-10-CM | POA: Diagnosis present

## 2020-04-21 DIAGNOSIS — K449 Diaphragmatic hernia without obstruction or gangrene: Secondary | ICD-10-CM | POA: Diagnosis present

## 2020-04-21 DIAGNOSIS — R52 Pain, unspecified: Secondary | ICD-10-CM

## 2020-04-21 DIAGNOSIS — L988 Other specified disorders of the skin and subcutaneous tissue: Secondary | ICD-10-CM | POA: Diagnosis not present

## 2020-04-21 DIAGNOSIS — B9620 Unspecified Escherichia coli [E. coli] as the cause of diseases classified elsewhere: Secondary | ICD-10-CM | POA: Diagnosis not present

## 2020-04-21 DIAGNOSIS — K863 Pseudocyst of pancreas: Secondary | ICD-10-CM | POA: Diagnosis present

## 2020-04-21 DIAGNOSIS — J9811 Atelectasis: Secondary | ICD-10-CM | POA: Diagnosis not present

## 2020-04-21 DIAGNOSIS — K659 Peritonitis, unspecified: Secondary | ICD-10-CM | POA: Diagnosis present

## 2020-04-21 DIAGNOSIS — L0291 Cutaneous abscess, unspecified: Secondary | ICD-10-CM

## 2020-04-21 DIAGNOSIS — I10 Essential (primary) hypertension: Secondary | ICD-10-CM | POA: Diagnosis present

## 2020-04-21 DIAGNOSIS — I959 Hypotension, unspecified: Secondary | ICD-10-CM

## 2020-04-21 DIAGNOSIS — Z20822 Contact with and (suspected) exposure to covid-19: Secondary | ICD-10-CM | POA: Diagnosis present

## 2020-04-21 DIAGNOSIS — K255 Chronic or unspecified gastric ulcer with perforation: Principal | ICD-10-CM | POA: Diagnosis present

## 2020-04-21 DIAGNOSIS — Z85828 Personal history of other malignant neoplasm of skin: Secondary | ICD-10-CM

## 2020-04-21 DIAGNOSIS — R109 Unspecified abdominal pain: Secondary | ICD-10-CM

## 2020-04-21 DIAGNOSIS — E669 Obesity, unspecified: Secondary | ICD-10-CM | POA: Diagnosis present

## 2020-04-21 DIAGNOSIS — I2692 Saddle embolus of pulmonary artery without acute cor pulmonale: Secondary | ICD-10-CM | POA: Diagnosis not present

## 2020-04-21 DIAGNOSIS — Z83438 Family history of other disorder of lipoprotein metabolism and other lipidemia: Secondary | ICD-10-CM

## 2020-04-21 DIAGNOSIS — I2699 Other pulmonary embolism without acute cor pulmonale: Secondary | ICD-10-CM

## 2020-04-21 DIAGNOSIS — E8770 Fluid overload, unspecified: Secondary | ICD-10-CM | POA: Diagnosis present

## 2020-04-21 DIAGNOSIS — M1990 Unspecified osteoarthritis, unspecified site: Secondary | ICD-10-CM | POA: Diagnosis present

## 2020-04-21 DIAGNOSIS — T8143XA Infection following a procedure, organ and space surgical site, initial encounter: Secondary | ICD-10-CM | POA: Diagnosis not present

## 2020-04-21 DIAGNOSIS — Z79899 Other long term (current) drug therapy: Secondary | ICD-10-CM

## 2020-04-21 DIAGNOSIS — R188 Other ascites: Secondary | ICD-10-CM | POA: Diagnosis present

## 2020-04-21 DIAGNOSIS — R0600 Dyspnea, unspecified: Secondary | ICD-10-CM

## 2020-04-21 DIAGNOSIS — Z811 Family history of alcohol abuse and dependence: Secondary | ICD-10-CM

## 2020-04-21 DIAGNOSIS — K229 Disease of esophagus, unspecified: Secondary | ICD-10-CM | POA: Diagnosis present

## 2020-04-21 DIAGNOSIS — J9601 Acute respiratory failure with hypoxia: Secondary | ICD-10-CM | POA: Diagnosis not present

## 2020-04-21 DIAGNOSIS — R000 Tachycardia, unspecified: Secondary | ICD-10-CM | POA: Diagnosis not present

## 2020-04-21 DIAGNOSIS — Z8249 Family history of ischemic heart disease and other diseases of the circulatory system: Secondary | ICD-10-CM

## 2020-04-21 DIAGNOSIS — K651 Peritoneal abscess: Secondary | ICD-10-CM | POA: Diagnosis not present

## 2020-04-21 DIAGNOSIS — R6521 Severe sepsis with septic shock: Secondary | ICD-10-CM | POA: Diagnosis not present

## 2020-04-21 DIAGNOSIS — A4181 Sepsis due to Enterococcus: Secondary | ICD-10-CM | POA: Diagnosis not present

## 2020-04-21 DIAGNOSIS — B952 Enterococcus as the cause of diseases classified elsewhere: Secondary | ICD-10-CM | POA: Diagnosis present

## 2020-04-21 DIAGNOSIS — R413 Other amnesia: Secondary | ICD-10-CM | POA: Diagnosis present

## 2020-04-21 DIAGNOSIS — K862 Cyst of pancreas: Secondary | ICD-10-CM | POA: Diagnosis present

## 2020-04-21 DIAGNOSIS — R1011 Right upper quadrant pain: Secondary | ICD-10-CM

## 2020-04-21 DIAGNOSIS — K861 Other chronic pancreatitis: Secondary | ICD-10-CM | POA: Diagnosis present

## 2020-04-21 DIAGNOSIS — K750 Abscess of liver: Secondary | ICD-10-CM | POA: Diagnosis not present

## 2020-04-21 DIAGNOSIS — Z0189 Encounter for other specified special examinations: Secondary | ICD-10-CM

## 2020-04-21 DIAGNOSIS — R198 Other specified symptoms and signs involving the digestive system and abdomen: Secondary | ICD-10-CM

## 2020-04-21 HISTORY — DX: Non-pressure chronic ulcer of other part of unspecified foot with unspecified severity: L97.509

## 2020-04-21 HISTORY — DX: Unspecified malignant neoplasm of skin, unspecified: C44.90

## 2020-04-21 LAB — COMPREHENSIVE METABOLIC PANEL
ALT: 9 U/L (ref 0–44)
AST: 18 U/L (ref 15–41)
Albumin: 4.5 g/dL (ref 3.5–5.0)
Alkaline Phosphatase: 37 U/L — ABNORMAL LOW (ref 38–126)
Anion gap: 12 (ref 5–15)
BUN: 20 mg/dL (ref 8–23)
CO2: 21 mmol/L — ABNORMAL LOW (ref 22–32)
Calcium: 8.8 mg/dL — ABNORMAL LOW (ref 8.9–10.3)
Chloride: 105 mmol/L (ref 98–111)
Creatinine, Ser: 1.03 mg/dL — ABNORMAL HIGH (ref 0.44–1.00)
GFR calc Af Amer: >60 mL/min (ref >60)
GFR calc non Af Amer: 58 mL/min — ABNORMAL LOW (ref >60)
Glucose, Bld: 94 mg/dL (ref 70–99)
Potassium: 3.7 mmol/L (ref 3.5–5.1)
Sodium: 138 mmol/L (ref 135–145)
Total Bilirubin: 4.3 mg/dL — ABNORMAL HIGH (ref 0.3–1.2)
Total Protein: 7.8 g/dL (ref 6.5–8.1)

## 2020-04-21 LAB — CBC WITH DIFFERENTIAL/PLATELET
Abs Immature Granulocytes: 0.03 10*3/uL (ref 0.00–0.07)
Basophils Absolute: 0 10*3/uL (ref 0.0–0.1)
Basophils Relative: 0 %
Eosinophils Absolute: 0 10*3/uL (ref 0.0–0.5)
Eosinophils Relative: 0 %
HCT: 40.1 % (ref 36.0–46.0)
Hemoglobin: 13.6 g/dL (ref 12.0–15.0)
Immature Granulocytes: 0 %
Lymphocytes Relative: 9 %
Lymphs Abs: 0.9 10*3/uL (ref 0.7–4.0)
MCH: 30.4 pg (ref 26.0–34.0)
MCHC: 33.9 g/dL (ref 30.0–36.0)
MCV: 89.7 fL (ref 80.0–100.0)
Monocytes Absolute: 0.6 10*3/uL (ref 0.1–1.0)
Monocytes Relative: 6 %
Neutro Abs: 7.8 10*3/uL — ABNORMAL HIGH (ref 1.7–7.7)
Neutrophils Relative %: 85 %
Platelets: 175 10*3/uL (ref 150–400)
RBC: 4.47 MIL/uL (ref 3.87–5.11)
RDW: 14.1 % (ref 11.5–15.5)
WBC: 9.3 10*3/uL (ref 4.0–10.5)
nRBC: 0 % (ref 0.0–0.2)

## 2020-04-21 LAB — ETHANOL: Alcohol, Ethyl (B): <10 mg/dL (ref <10)

## 2020-04-21 LAB — LIPASE, BLOOD: Lipase: 33 U/L (ref 11–51)

## 2020-04-21 MED ORDER — LISINOPRIL-HYDROCHLOROTHIAZIDE 10-12.5 MG PO TABS: 1.0000 | ORAL_TABLET | Freq: Every day | ORAL | Status: DC

## 2020-04-21 MED ORDER — PIPERACILLIN-TAZOBACTAM 3.375 G IVPB 30 MIN: 3.3750 g | Freq: Three times a day (TID) | INTRAVENOUS | Status: DC

## 2020-04-21 MED ORDER — METHOCARBAMOL 500 MG PO TABS: 500.0000 mg | ORAL_TABLET | Freq: Three times a day (TID) | ORAL | Status: DC | PRN

## 2020-04-21 MED ORDER — TRAZODONE HCL 50 MG PO TABS
100.0000 mg | ORAL_TABLET | Freq: Every day | ORAL | Status: DC
  Administered 2020-04-22: 100 mg via ORAL
  Filled 2020-04-21: qty 2

## 2020-04-21 MED ORDER — HYDROCODONE-ACETAMINOPHEN 5-325 MG PO TABS
1.0000 | ORAL_TABLET | Freq: Once | ORAL | Status: AC
  Administered 2020-04-21: 1 via ORAL
  Filled 2020-04-21: qty 1

## 2020-04-21 MED ORDER — ONDANSETRON HCL 4 MG PO TABS: 4.0000 mg | ORAL_TABLET | Freq: Three times a day (TID) | ORAL | 0 refills | Status: DC | PRN

## 2020-04-21 MED ORDER — IBUPROFEN 400 MG PO TABS: 400.0000 mg | ORAL_TABLET | Freq: Four times a day (QID) | ORAL | Status: DC | PRN

## 2020-04-21 MED ORDER — ONDANSETRON HCL 4 MG/2ML IJ SOLN
4.0000 mg | Freq: Once | INTRAMUSCULAR | Status: AC
  Administered 2020-04-21: 4 mg via INTRAVENOUS
  Filled 2020-04-21: qty 2

## 2020-04-21 MED ORDER — CELECOXIB 100 MG PO CAPS: 100.0000 mg | ORAL_CAPSULE | Freq: Two times a day (BID) | ORAL | 0 refills | Status: DC

## 2020-04-21 MED ORDER — GABAPENTIN 300 MG PO CAPS
300.0000 mg | ORAL_CAPSULE | Freq: Two times a day (BID) | ORAL | Status: DC
  Administered 2020-04-22: 300 mg via ORAL
  Filled 2020-04-21 (×2): qty 1

## 2020-04-21 MED ORDER — SODIUM CHLORIDE 0.9% FLUSH
3.0000 mL | INTRAVENOUS | Status: DC | PRN
  Administered 2020-04-24: 3 mL via INTRAVENOUS

## 2020-04-21 MED ORDER — MORPHINE SULFATE (PF) 4 MG/ML IV SOLN
4.0000 mg | INTRAVENOUS | Status: DC | PRN
  Administered 2020-04-22 (×2): 4 mg via INTRAVENOUS
  Filled 2020-04-21 (×2): qty 1

## 2020-04-21 MED ORDER — FENTANYL CITRATE (PF) 100 MCG/2ML IJ SOLN
50.0000 ug | Freq: Once | INTRAMUSCULAR | Status: AC
  Administered 2020-04-21: 50 ug via INTRAVENOUS
  Filled 2020-04-21: qty 2

## 2020-04-21 MED ORDER — METOCLOPRAMIDE HCL 5 MG/ML IJ SOLN
10.0000 mg | Freq: Once | INTRAMUSCULAR | Status: AC
  Administered 2020-04-21: 10 mg via INTRAVENOUS
  Filled 2020-04-21: qty 2

## 2020-04-21 MED ORDER — KETOROLAC TROMETHAMINE 30 MG/ML IJ SOLN: 30.0000 mg | Freq: Four times a day (QID) | INTRAMUSCULAR | Status: DC | PRN

## 2020-04-21 MED ORDER — SODIUM CHLORIDE 0.9 % IV SOLN: 250.0000 mL | INTRAVENOUS | Status: DC | PRN

## 2020-04-21 MED ORDER — ACETAMINOPHEN 500 MG PO TABS: 500.0000 mg | ORAL_TABLET | Freq: Four times a day (QID) | ORAL | Status: DC | PRN

## 2020-04-21 MED ORDER — CITALOPRAM HYDROBROMIDE 20 MG PO TABS
20.0000 mg | ORAL_TABLET | Freq: Every day | ORAL | Status: DC
  Filled 2020-04-21 (×2): qty 1

## 2020-04-21 MED ORDER — ONDANSETRON HCL 4 MG/2ML IJ SOLN
4.0000 mg | Freq: Four times a day (QID) | INTRAMUSCULAR | Status: DC
  Administered 2020-04-22 (×2): 4 mg via INTRAVENOUS
  Filled 2020-04-21 (×3): qty 2

## 2020-04-21 MED ORDER — FAMOTIDINE 20 MG PO TABS: 20.0000 mg | ORAL_TABLET | Freq: Two times a day (BID) | ORAL | 0 refills | Status: DC

## 2020-04-21 MED ORDER — SODIUM CHLORIDE 0.9% FLUSH
3.0000 mL | Freq: Two times a day (BID) | INTRAVENOUS | Status: DC
  Administered 2020-04-22 – 2020-04-25 (×8): 3 mL via INTRAVENOUS

## 2020-04-21 MED ORDER — DONEPEZIL HCL 5 MG PO TABS
5.0000 mg | ORAL_TABLET | Freq: Every day | ORAL | Status: DC
  Filled 2020-04-21 (×2): qty 1

## 2020-04-21 MED ORDER — HEPARIN SODIUM (PORCINE) 5000 UNIT/ML IJ SOLN: 5000.0000 [IU] | Freq: Three times a day (TID) | INTRAMUSCULAR | Status: DC
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
## 2020-04-21 NOTE — ED Provider Notes (Addendum)
   Patient signed out to me by Margarita Mail, PA-C at end of shift pending completion of work-up.  Ms Dack was seen here last night and evaluated for possible code stroke.  She reports having waxing and waning of upper abdominal pain for several days.  She returns today for increasing pain with vomiting that began this morning.  She does admit to drinking more alcohol recently along with taking Aleve for her aches and pains of her joints.  She had a CT scan of her abdomen and pelvis last evening that showed cholelithiasis without gallbladder inflammation or biliary dilatation it was also noted there were coarse calcifications in the pancreatic head that was consistent with pancreatitis.  There were 3 cystic lesions in the pancreatic head with the largest measuring 2.2 cm.  And she was found to have a bilirubin of 3.  On my exam this evening, patient has moderate tenderness of the epigastric and left upper quadrant.  Abdomen is otherwise soft and without guarding.  She reports continued nausea without vomiting here.  Her bilirubin this evening has increased to 4.3 without significant elevation of transaminases.  She is uncomfortable appearing, but nontoxic.  The source of her hyperbilirubinemia is unclear at this time.  I will consult gastroenterology for further recommendation on this patient's care.  There was one documented episode of an O2 sat of 86% sometime after pt was given IV fentanyl.  I believe this was episodic I have been in and out of the exam room several times and she has maintained O2 sats in the upper 90s and does not appear hypoxic and has not required oxygen   Labs Reviewed  CBC WITH DIFFERENTIAL/PLATELET - Abnormal; Notable for the following components:      Result Value   Neutro Abs 7.8 (*)    All other components within normal limits  COMPREHENSIVE METABOLIC PANEL - Abnormal; Notable for the following components:   CO2 21 (*)    Creatinine, Ser 1.03 (*)    Calcium 8.8 (*)     Alkaline Phosphatase 37 (*)    Total Bilirubin 4.3 (*)    GFR calc non Af Amer 58 (*)    All other components within normal limits  LIPASE, BLOOD  ETHANOL     2110 consulted gastroenterology, Dr. Gala Romney and discussed findings.  He recommends that patient be admitted to hospitalist service, n.p.o. after midnight and he will consult on this pt in the morning.  He also asked that a hepatic profile (fractionated bilirubin) be ordered for tomorrow morning  Consulted hospitalist, Dr. Linda Hedges who agrees to admit patient.   Kem Parkinson, PA-C 04/21/20 2143    Kem Parkinson, PA-C 04/21/20 2200    Noemi Chapel, MD 04/22/20 1128

## 2020-04-21 NOTE — H&P (Signed)
History and Physical    Maria Murphy KVQ:[id number] DOB: [birth year] DOA: 04/21/2020  PCP: Rosita Fire, MD (Confirm with patient/family/NH records and if not entered, this has to be entered at [facility] point of entry) Patient coming from: home  I have personally briefly reviewed patient's old medical records in [place]  Chief Complaint: epigastric pain  HPI: Maria Murphy is a 64 y.o. female with medical history significant of HTN, decreased memory on medication. Maria Murphy was seen in [facility] last night, 04/20/20 and evaluated for possible code stroke with negative results.  She also reports having had waxing and waning of upper abdominal pain for several days.  She returns to [facility] today for increasing epigastric pain with vomiting that began this morning.  She does admit to drinking more alcohol recently along with taking Aleve for her aches and pains of her joints.  She had a CT scan of her abdomen and pelvis 04/20/20 that revealed cholelithiasis without gallbladder inflammation or biliary dilatation. Coarse calcifications were noted in the pancreatic head consistent with chronic pancreatitis.  There were 3 cystic lesions in the pancreatic head with the largest measuring 2.2 cm.  And she was found to have a bilirubin of 3.  ED Course: On re-exam this evening the patient has moderate tenderness of the epigastric and left upper quadrant.  Her abdomen is otherwise soft and without guarding.  She reports continued nausea without vomiting here.  Her bilirubin this evening has increased to 4.3 without significant elevation of transaminases.  She is uncomfortable appearing, but nontoxic.  The source of her hyperbilirubinemia is unclear at this time. There was one documented episode of an O2 sat of 86% sometime after pt was given IV fentanyl. Dr Sydell Axon for GI was consulted. He recommended having the patient admitted, made NPO /p MN and he will consult in the AM. [facility] is called to admit the patient.    Review of Systems: As per HPI otherwise 10 point review of systems negative.    Past Medical History:  Diagnosis Date  . Essential hypertension, benign 01/24/2015  . Hypertension   . Memory loss   . Vision abnormalities     Past Surgical History:  Procedure Laterality Date  . TONSILLECTOMY     Soc Hx -  Widowed after 86 years of marriage. She has children and grandchildren. 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 She willhave a glass of wine on weekend days but is not a regular drinker and has never been a heavy drinker.    reports that she has never smoked. She has never used smokeless tobacco. She reports that she does not drink alcohol and does not use drugs.  Allergies  Allergen Reactions  . Wheat Extract Swelling    Family History  Problem Relation Age of Onset  . High Cholesterol Mother   . Hypertension Mother   . Arthritis/Rheumatoid Mother   . Alcohol abuse Father      Prior to Admission medications   Medication Sig Start Date End Date Taking? Authorizing Provider  donepezil (ARICEPT) 10 MG tablet Take 5 mg by mouth daily. 04/02/20  Yes [provider]  acetaminophen (TYLENOL) 500 MG tablet Take 1 tablet (500 mg total) by mouth every 6 (six) hours as needed. 08/10/18   Evalee Jefferson, PA-C  celecoxib (CELEBREX) 100 MG capsule Take 1 capsule (100 mg total) by mouth 2 (two) times daily. 04/21/20   Margarita Mail, PA-C  citalopram (CELEXA) 20 MG tablet Take 1 tablet (20  mg total) by mouth daily. 08/16/19   Jaynee Eagles, PA-C  famotidine (PEPCID) 20 MG tablet Take 1 tablet (20 mg total) by mouth 2 (two) times daily. 04/21/20   Harris, Vernie Shanks, PA-C  gabapentin (NEURONTIN) 300 MG capsule TAKE 1 CAPSULE BY MOUTH TWICE DAILY 08/23/19   [provider]  Ginger, Zingiber officinalis, (GINGER PO) Take 1 tablet by mouth.     [provider]  ibuprofen (ADVIL,MOTRIN) 400 MG tablet Take 1 tablet (400 mg total) by mouth every 6 (six) hours as needed  for moderate pain. 08/10/18   Evalee Jefferson, PA-C  lisinopril-hydrochlorothiazide (PRINZIDE,ZESTORETIC) 10-12.5 MG tablet Take 1 tablet by mouth daily. 08/07/18   [provider]  methocarbamol (ROBAXIN) 500 MG tablet Take 1 tablet (500 mg total) by mouth every 8 (eight) hours as needed for muscle spasms. 03/26/20   Rancour, Annie Main, MD  naproxen (NAPROSYN) 500 MG tablet Take 1 tablet (500 mg total) by mouth 2 (two) times daily as needed. 03/26/20   Rancour, Annie Main, MD  ondansetron (ZOFRAN) 4 MG tablet Take 1 tablet (4 mg total) by mouth every 8 (eight) hours as needed for nausea or vomiting. 04/21/20   Margarita Mail, PA-C  traZODone (DESYREL) 100 MG tablet Take 100 mg by mouth at bedtime.  08/02/19   [provider]    Physical Exam: Vitals:   04/21/20 1930 04/21/20 2030 04/21/20 2130 04/21/20 2200  BP: (!) 144/98 139/72 134/75 103/76  Pulse:  71 63 76  Resp:  16    Temp:      TempSrc:      SpO2:  (!) 86% 99% 99%  Weight:      Height:         Vitals:   04/21/20 1930 04/21/20 2030 04/21/20 2130 04/21/20 2200  BP: (!) 144/98 139/72 134/75 103/76  Pulse:  71 63 76  Resp:  16    Temp:      TempSrc:      SpO2:  (!) 86% 99% 99%  Weight:      Height:       General: overweight woman who is very uncomfortable Eyes: PERRL, lids and conjunctivae normal ENMT: Mucous membranes are moist. Posterior pharynx clear of any exudate or lesions.Normal dentition.  Neck: normal, supple, no masses, no thyromegaly Respiratory: clear to auscultation bilaterally, no wheezing, no crackles. Normal respiratory effort. No accessory muscle use.  Cardiovascular: Regular rate and rhythm, no murmurs / rubs / gallops. No extremity edema. 2+ pedal pulses. No carotid bruits.  Abdomen: hypoactive to absent BS. Diffuse tenderness to light palpation with marked tenderness and guarding to deeper palpation or percussion. Maximum tenderness RLQ, RUQ and epigastric region.  Musculoskeletal: no clubbing /  cyanosis. No joint deformity upper and lower extremities. Good ROM, no contractures. Normal muscle tone.  Skin: no rashes, lesions, ulcers. No induration Neurologic: CN 2-12 grossly intact. Sensation intact, DTR normal. Strength 5/5 in all 4.  Psychiatric: Normal judgment and insight. Alert and oriented x 3. Normal mood.     Labs on Admission: I have personally reviewed following labs and imaging studies  CBC: Recent Labs  Lab 04/20/20 2022 04/21/20 1835  WBC 7.5 9.3  NEUTROABS 5.2 7.8*  HGB 12.4 13.6  HCT 37.1 40.1  MCV 89.2 89.7  PLT 173 720   Basic Metabolic Panel: Recent Labs  Lab 04/20/20 2022 04/21/20 1835  NA 141 138  K 4.1 3.7  CL 105 105  CO2 22 21*  GLUCOSE 97 94  BUN 24*  20  CREATININE 1.31* 1.03*  CALCIUM 8.8* 8.8*   GFR: Estimated Creatinine Clearance: 61 mL/min (A) (by C-G formula based on SCr of 1.03 mg/dL (H)). Liver Function Tests: Recent Labs  Lab 04/20/20 2022 04/21/20 1835  AST 22 18  ALT 9 9  ALKPHOS 37* 37*  BILITOT 3.0* 4.3*  PROT 7.3 7.8  ALBUMIN 4.2 4.5   Recent Labs  Lab 04/21/20 1835  LIPASE 33   Recent Labs  Lab 04/20/20 2022  AMMONIA 36*   Coagulation Profile: No results for input(s): INR, PROTIME in the last 168 hours. Cardiac Enzymes: No results for input(s): CKTOTAL, CKMB, CKMBINDEX, TROPONINI in the last 168 hours. BNP (last 3 results) No results for input(s): PROBNP in the last 8760 hours. HbA1C: No results for input(s): HGBA1C in the last 72 hours. CBG: Recent Labs  Lab 04/20/20 2019  GLUCAP 96   Lipid Profile: No results for input(s): CHOL, HDL, LDLCALC, TRIG, CHOLHDL, LDLDIRECT in the last 72 hours. Thyroid Function Tests: No results for input(s): TSH, T4TOTAL, FREET4, T3FREE, THYROIDAB in the last 72 hours. Anemia Panel: No results for input(s): VITAMINB12, FOLATE, FERRITIN, TIBC, IRON, RETICCTPCT in the last 72 hours. Urine analysis:    Component Value Date/Time   COLORURINE YELLOW 04/20/2020 2020    APPEARANCEUR CLEAR 04/20/2020 2020   LABSPEC 1.030 04/20/2020 2020   PHURINE 5.0 04/20/2020 2020   GLUCOSEU NEGATIVE 04/20/2020 2020   HGBUR NEGATIVE 04/20/2020 2020   BILIRUBINUR MODERATE (A) 04/20/2020 2020   KETONESUR NEGATIVE 04/20/2020 2020   PROTEINUR NEGATIVE 04/20/2020 2020   NITRITE NEGATIVE 04/20/2020 2020   LEUKOCYTESUR NEGATIVE 04/20/2020 2020    Radiological Exams on Admission: CT Head Wo Contrast  Result Date: 04/20/2020 CLINICAL DATA:  Mental status change EXAM: CT HEAD WITHOUT CONTRAST TECHNIQUE: Contiguous axial images were obtained from the base of the skull through the vertex without intravenous contrast. COMPARISON:  CT brain 03/19/2020 FINDINGS: Brain: No acute territorial infarction, hemorrhage, or intracranial mass. The ventricles are nonenlarged. Vascular: No hyperdense vessels.  No unexpected calcification. Skull: Normal. Negative for fracture or focal lesion. Sinuses/Orbits: No acute finding. Other: None IMPRESSION: Negative non contrasted CT appearance of the brain. Electronically Signed   By: Donavan Foil M.D.   On: 04/20/2020 21:14   CT ABDOMEN PELVIS W CONTRAST  Result Date: 04/20/2020 CLINICAL DATA:  Acute abdominal pain. Elevated bilirubin. Altered mental status. EXAM: CT ABDOMEN AND PELVIS WITH CONTRAST TECHNIQUE: Multidetector CT imaging of the abdomen and pelvis was performed using the standard protocol following bolus administration of intravenous contrast. CONTRAST:  15mL OMNIPAQUE IOHEXOL 300 MG/ML  SOLN COMPARISON:  No prior abdominal imaging. FINDINGS: Lower chest: Mild hypoventilatory changes in the lung bases. No pleural fluid or confluent airspace disease. Hepatobiliary: 7 mm hypodensity in the right dome of the liver, series 2, image 12, too small to accurately characterize. Multiple cholesterol gallstones in the gallbladder. No pericholecystic inflammation or evidence of wall thickening. No common bile duct dilatation. Pancreas: Coarse calcification  in the pancreatic head. There are at least 3 cystic lesions in the pancreatic head/uncinate process, largest measuring 2.2 cm, series 2, image 29. There is an adjacent 1.3 cm cystic structure and a 1 cm cyst more inferiorly. No definite peripancreatic fat stranding or inflammation. There is no pancreatic ductal dilatation. Spleen: Normal in size without focal abnormality. Adrenals/Urinary Tract: No adrenal nodule. There is a punctate calcification involving the medial limb of the left adrenal gland. Indeterminate lesion arising from the upper right kidney measures 17  x 18 mm has internal heterogeneity and peripheral calcifications. This is adjacent to a small cortical cyst. There are scattered additional cysts within the right kidney as well as parapelvic cysts. Cortical as well as parapelvic cysts in the left kidney. No hydronephrosis. No perinephric edema. Urinary bladder is unremarkable. Stomach/Bowel: Small hiatal hernia with mild distal esophageal wall thickening. Stomach is unremarkable. Normal positioning of the duodenum and ligament of Treitz. There is no small bowel obstruction or inflammatory change. Cecum is high-riding in the right mid abdomen. Normal air-filled appendix. Transverse colonic tortuosity. Colonic diverticulosis involving the distal descending and sigmoid colon. No acute diverticulitis. Vascular/Lymphatic: Aortic atherosclerosis with mild tortuosity. No aortic aneurysm. The portal vein is patent. There is bi-iliac tortuosity. No bulky enlarged abdominopelvic lymph nodes. Reproductive: Globular enlarged uterus with multiple presumed fibroids, the largest appears projecting posteriorly and measures 5.6 cm. Right ovary tentatively visualized and normal. The left ovary is not definitively seen. Other: No free air or free fluid. Musculoskeletal: Multilevel degenerative change in the spine. Vacuum phenomena at L4-L5 and L5-S1. Prominent facet hypertrophy. Modic endplate changes at W29-F62. There  are no acute or suspicious osseous abnormalities. IMPRESSION: 1. Cholelithiasis without gallbladder inflammation. No biliary dilatation. 2. Coarse calcification in the pancreatic head consistent with chronic pancreatitis. There are at least 3 cystic lesions in the pancreatic head/uncinate process, largest measuring 2.2 cm. These may represent sequela of prior pancreatitis and pseudocysts, however recommend further evaluation with pancreatic protocol MRI for further characterization. This should ideally be performed on an elective basis after resolution of acute event when patient is able to tolerate breath hold technique. 3. Indeterminate 17 x 18 mm lesion arising from the upper right kidney has internal heterogeneity and peripheral calcifications. 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 Recommend MRI characterization of this lesion. There are additional simple cysts as well as parapelvic cysts in both kidneys. 4. Globular enlarged uterus with multiple presumed fibroids, the largest measuring 5.6 cm. 5. Small hiatal hernia with mild distal esophageal wall thickening, can be seen with reflux or esophagitis. 6. Colonic diverticulosis without acute inflammation. Aortic Atherosclerosis (ICD10-I70.0). Electronically Signed   By: Keith Rake M.D.   On: 04/20/2020 23:14   DG Chest Port 1 View  Result Date: 04/20/2020 CLINICAL DATA:  Altered mental status. EXAM: PORTABLE CHEST 1 VIEW COMPARISON:  September 09, 2014 FINDINGS: Mild, diffuse chronic appearing increased lung markings are seen without evidence of acute infiltrate, pleural effusion or pneumothorax. The cardiac silhouette is mildly enlarged. The visualized skeletal structures are unremarkable. IMPRESSION: Chronic appearing increased lung markings without evidence of acute or active cardiopulmonary disease. Electronically Signed   By: Virgina Norfolk M.D.   On: 04/20/2020 21:05    EKG:  Independently reviewed. NSR nl EKG  Assessment/Plan Active Problems:   Hyperbilirubinemia   Epigastric pain   Essential hypertension, benign  (please populate well all problems here in Problem List. (For example, if patient is on BP meds at home and you resume or decide to hold them, it is a problem that needs to be her. Same for CAD, COPD, HLD and so on)   1. GI - patient with diffuse abdominal pain worst at RUQ/epigrastric region. No leukocytosis. Normal LFTs, normal lipase. CT reveals cholesterol stones. Concern for stone related pancreatitis acute on chronic, doubt EtOH as etiology. With RLQ pain concern for appendicitis. Plan Zosyn for coverage of potential intra-abdominal infection  MRCP for better evaluation for CBD or  biliary obstruction  Maria 4mg  q4 for pain control ` Zofran for nausea  NPO /x sips with meds, ice chips  IVF  2. HTN- continue home meds  3. Psych - will continue psycho-tropic meds  DVT prophylaxis: heparin  Code Status: full code  Family Communication: attempted to call sister [name] - nonworking number Disposition Plan: TBD Consults called: Dr. Gala Romney for GI- will see patient 04/22/20 AM (with names) Admission status: obs    Adella Hare MD [facility] Pager [phone]  If 7PM-7AM, please contact night-coverage [email or web address] Password [facility]  04/21/2020, 11:20 PM

## 2020-04-21 NOTE — ED Triage Notes (Signed)
Here last night   Per note pt was not co operative  Here today w complaint of abd pain

## 2020-04-21 NOTE — Discharge Instructions (Addendum)
Wound Care Instructions: Continue to pack open portion of midline wound with iodoform gauze once daily until tract closes.    Information on my medicine - XARELTO (rivaroxaban)  This medication education was reviewed with me or my healthcare representative as part of my discharge preparation.    WHY WAS XARELTO PRESCRIBED FOR YOU? Xarelto was prescribed to treat blood clots that may have been found in the veins of your legs (deep vein thrombosis) or in your lungs (pulmonary embolism) and to reduce the risk of them occurring again.  What do you need to know about Xarelto? The starting dose is one 15 mg tablet taken TWICE daily with food for the FIRST 21 DAYS then on  05/25/20  the dose is changed to one 20 mg tablet taken ONCE A DAY with your evening meal.  DO NOT stop taking Xarelto without talking to the health care provider who prescribed the medication.  Refill your prescription for 20 mg tablets before you run out.  After discharge, you should have regular check-up appointments with your healthcare provider that is prescribing your Xarelto.  In the future your dose may need to be changed if your kidney function changes by a significant amount.  What do you do if you miss a dose? If you are taking Xarelto TWICE DAILY and you miss a dose, take it as soon as you remember. You may take two 15 mg tablets (total 30 mg) at the same time then resume your regularly scheduled 15 mg twice daily the next day.  If you are taking Xarelto ONCE DAILY and you miss a dose, take it as soon as you remember on the same day then continue your regularly scheduled once daily regimen the next day. Do not take two doses of Xarelto at the same time.   Important Safety Information Xarelto is a blood thinner medicine that can cause bleeding. You should call your healthcare provider right away if you experience any of the following: ? Bleeding from an injury or your nose that does not stop. ? Unusual colored  urine (red or dark brown) or unusual colored stools (red or black). ? Unusual bruising for unknown reasons. ? A serious fall or if you hit your head (even if there is no bleeding).  Some medicines may interact with Xarelto and might increase your risk of bleeding while on Xarelto. To help avoid this, consult your healthcare provider or pharmacist prior to using any new prescription or non-prescription medications, including herbals, vitamins, non-steroidal anti-inflammatory drugs (NSAIDs) and supplements.  This website has more information on Xarelto: [email or web address]

## 2020-04-22 ENCOUNTER — Observation Stay (HOSPITAL_COMMUNITY): Payer: Medicaid Other

## 2020-04-22 ENCOUNTER — Inpatient Hospital Stay (HOSPITAL_COMMUNITY): Payer: Medicaid Other

## 2020-04-22 ENCOUNTER — Encounter (HOSPITAL_COMMUNITY): Payer: Self-pay | Admitting: Internal Medicine

## 2020-04-22 ENCOUNTER — Observation Stay (HOSPITAL_COMMUNITY): Payer: Medicaid Other | Admitting: Anesthesiology

## 2020-04-22 ENCOUNTER — Encounter (HOSPITAL_COMMUNITY)
Admission: EM | Disposition: A | Discharge status: Home Health | Payer: Self-pay | Source: Home / Self Care | Attending: General Surgery

## 2020-04-22 DIAGNOSIS — Z8249 Family history of ischemic heart disease and other diseases of the circulatory system: Secondary | ICD-10-CM | POA: Diagnosis not present

## 2020-04-22 DIAGNOSIS — I9589 Other hypotension: Secondary | ICD-10-CM

## 2020-04-22 DIAGNOSIS — J9601 Acute respiratory failure with hypoxia: Secondary | ICD-10-CM | POA: Diagnosis not present

## 2020-04-22 DIAGNOSIS — R1013 Epigastric pain: Secondary | ICD-10-CM | POA: Diagnosis not present

## 2020-04-22 DIAGNOSIS — A4181 Sepsis due to Enterococcus: Secondary | ICD-10-CM | POA: Diagnosis not present

## 2020-04-22 DIAGNOSIS — R198 Other specified symptoms and signs involving the digestive system and abdomen: Secondary | ICD-10-CM

## 2020-04-22 DIAGNOSIS — K255 Chronic or unspecified gastric ulcer with perforation: Secondary | ICD-10-CM | POA: Diagnosis present

## 2020-04-22 DIAGNOSIS — I2692 Saddle embolus of pulmonary artery without acute cor pulmonale: Secondary | ICD-10-CM | POA: Diagnosis not present

## 2020-04-22 DIAGNOSIS — Z20822 Contact with and (suspected) exposure to covid-19: Secondary | ICD-10-CM | POA: Diagnosis present

## 2020-04-22 DIAGNOSIS — I10 Essential (primary) hypertension: Secondary | ICD-10-CM | POA: Diagnosis present

## 2020-04-22 DIAGNOSIS — K275 Chronic or unspecified peptic ulcer, site unspecified, with perforation: Secondary | ICD-10-CM

## 2020-04-22 DIAGNOSIS — J9811 Atelectasis: Secondary | ICD-10-CM | POA: Diagnosis not present

## 2020-04-22 DIAGNOSIS — K861 Other chronic pancreatitis: Secondary | ICD-10-CM | POA: Diagnosis present

## 2020-04-22 DIAGNOSIS — Z6838 Body mass index (BMI) 38.0-38.9, adult: Secondary | ICD-10-CM | POA: Diagnosis not present

## 2020-04-22 DIAGNOSIS — K863 Pseudocyst of pancreas: Secondary | ICD-10-CM | POA: Diagnosis present

## 2020-04-22 DIAGNOSIS — K862 Cyst of pancreas: Secondary | ICD-10-CM | POA: Diagnosis present

## 2020-04-22 DIAGNOSIS — R6521 Severe sepsis with septic shock: Secondary | ICD-10-CM | POA: Diagnosis not present

## 2020-04-22 DIAGNOSIS — R188 Other ascites: Secondary | ICD-10-CM | POA: Diagnosis present

## 2020-04-22 DIAGNOSIS — E861 Hypovolemia: Secondary | ICD-10-CM | POA: Diagnosis not present

## 2020-04-22 DIAGNOSIS — Z83438 Family history of other disorder of lipoprotein metabolism and other lipidemia: Secondary | ICD-10-CM | POA: Diagnosis not present

## 2020-04-22 DIAGNOSIS — K659 Peritonitis, unspecified: Secondary | ICD-10-CM | POA: Diagnosis present

## 2020-04-22 DIAGNOSIS — R1010 Upper abdominal pain, unspecified: Secondary | ICD-10-CM | POA: Diagnosis not present

## 2020-04-22 DIAGNOSIS — M1990 Unspecified osteoarthritis, unspecified site: Secondary | ICD-10-CM | POA: Diagnosis present

## 2020-04-22 DIAGNOSIS — K750 Abscess of liver: Secondary | ICD-10-CM | POA: Diagnosis not present

## 2020-04-22 DIAGNOSIS — Z79899 Other long term (current) drug therapy: Secondary | ICD-10-CM | POA: Diagnosis not present

## 2020-04-22 DIAGNOSIS — Z811 Family history of alcohol abuse and dependence: Secondary | ICD-10-CM | POA: Diagnosis not present

## 2020-04-22 DIAGNOSIS — E669 Obesity, unspecified: Secondary | ICD-10-CM | POA: Diagnosis present

## 2020-04-22 DIAGNOSIS — L0291 Cutaneous abscess, unspecified: Secondary | ICD-10-CM | POA: Diagnosis not present

## 2020-04-22 DIAGNOSIS — Y838 Other surgical procedures as the cause of abnormal reaction of the patient, or of later complication, without mention of misadventure at the time of the procedure: Secondary | ICD-10-CM | POA: Diagnosis not present

## 2020-04-22 DIAGNOSIS — R413 Other amnesia: Secondary | ICD-10-CM | POA: Diagnosis present

## 2020-04-22 DIAGNOSIS — T8143XA Infection following a procedure, organ and space surgical site, initial encounter: Secondary | ICD-10-CM | POA: Diagnosis not present

## 2020-04-22 DIAGNOSIS — K651 Peritoneal abscess: Secondary | ICD-10-CM | POA: Diagnosis not present

## 2020-04-22 HISTORY — PX: LAPAROTOMY: SHX154

## 2020-04-22 HISTORY — PX: GASTRORRHAPHY: SHX6263

## 2020-04-22 HISTORY — PX: CENTRAL LINE INSERTION: CATH118232

## 2020-04-22 HISTORY — DX: Other specified symptoms and signs involving the digestive system and abdomen: R19.8

## 2020-04-22 LAB — COMPREHENSIVE METABOLIC PANEL
ALT: 7 U/L (ref 0–44)
AST: 21 U/L (ref 15–41)
Albumin: 3.8 g/dL (ref 3.5–5.0)
Alkaline Phosphatase: 27 U/L — ABNORMAL LOW (ref 38–126)
Anion gap: 11 (ref 5–15)
BUN: 24 mg/dL — ABNORMAL HIGH (ref 8–23)
CO2: 20 mmol/L — ABNORMAL LOW (ref 22–32)
Calcium: 8.3 mg/dL — ABNORMAL LOW (ref 8.9–10.3)
Chloride: 108 mmol/L (ref 98–111)
Creatinine, Ser: 0.98 mg/dL (ref 0.44–1.00)
GFR calc Af Amer: >60 mL/min (ref >60)
GFR calc non Af Amer: >60 mL/min (ref >60)
Glucose, Bld: 130 mg/dL — ABNORMAL HIGH (ref 70–99)
Potassium: 3.8 mmol/L (ref 3.5–5.1)
Sodium: 139 mmol/L (ref 135–145)
Total Bilirubin: 1.3 mg/dL — ABNORMAL HIGH (ref 0.3–1.2)
Total Protein: 6.4 g/dL — ABNORMAL LOW (ref 6.5–8.1)

## 2020-04-22 LAB — ABO/RH: ABO/RH(D): O NEG

## 2020-04-22 LAB — URINE CULTURE: Culture: NO GROWTH

## 2020-04-22 LAB — SARS CORONAVIRUS 2 BY RT PCR (HOSPITAL ORDER, PERFORMED IN ~~LOC~~ HOSPITAL LAB): SARS Coronavirus 2: NEGATIVE

## 2020-04-22 LAB — HIV ANTIBODY (ROUTINE TESTING W REFLEX): HIV Screen 4th Generation wRfx: NONREACTIVE

## 2020-04-22 LAB — TYPE AND SCREEN
ABO/RH(D): O NEG
Antibody Screen: NEGATIVE

## 2020-04-22 LAB — MRSA PCR SCREENING: MRSA by PCR: NEGATIVE

## 2020-04-22 SURGERY — LAPAROTOMY, EXPLORATORY
Anesthesia: General | Site: Groin | Laterality: Right

## 2020-04-22 MED ORDER — SODIUM CHLORIDE 0.9 % IV BOLUS
1000.0000 mL | Freq: Once | INTRAVENOUS | Status: AC
  Administered 2020-04-22: 1000 mL via INTRAVENOUS

## 2020-04-22 MED ORDER — MIDAZOLAM HCL 2 MG/2ML IJ SOLN
INTRAMUSCULAR | Status: DC | PRN
  Administered 2020-04-22: 2 mg via INTRAVENOUS

## 2020-04-22 MED ORDER — BUPIVACAINE LIPOSOME 1.3 % IJ SUSP
INTRAMUSCULAR | Status: DC | PRN
  Administered 2020-04-22: 20 mL

## 2020-04-22 MED ORDER — LIDOCAINE HCL (PF) 1 % IJ SOLN
INTRAMUSCULAR | Status: AC
  Filled 2020-04-22: qty 2

## 2020-04-22 MED ORDER — PROPOFOL 10 MG/ML IV BOLUS
INTRAVENOUS | Status: AC
  Filled 2020-04-22: qty 20

## 2020-04-22 MED ORDER — LIDOCAINE HCL (CARDIAC) PF 50 MG/5ML IV SOSY
PREFILLED_SYRINGE | INTRAVENOUS | Status: DC | PRN
  Administered 2020-04-22: 100 mg via INTRAVENOUS

## 2020-04-22 MED ORDER — SODIUM CHLORIDE 0.9 % IR SOLN
Status: DC | PRN
  Administered 2020-04-22: 3000 mL

## 2020-04-22 MED ORDER — ONDANSETRON HCL 4 MG/2ML IJ SOLN
INTRAMUSCULAR | Status: DC | PRN
  Administered 2020-04-22: 4 mg via INTRAVENOUS

## 2020-04-22 MED ORDER — DEXAMETHASONE SODIUM PHOSPHATE 10 MG/ML IJ SOLN
INTRAMUSCULAR | Status: DC | PRN
  Administered 2020-04-22: 5 mg via INTRAVENOUS

## 2020-04-22 MED ORDER — FENTANYL CITRATE (PF) 100 MCG/2ML IJ SOLN
INTRAMUSCULAR | Status: DC | PRN
  Administered 2020-04-22: 100 ug via INTRAVENOUS
  Administered 2020-04-22: 50 ug via INTRAVENOUS
  Administered 2020-04-22: 100 ug via INTRAVENOUS

## 2020-04-22 MED ORDER — ACETAMINOPHEN 650 MG RE SUPP: 650.0000 mg | Freq: Four times a day (QID) | RECTAL | Status: DC | PRN

## 2020-04-22 MED ORDER — PANTOPRAZOLE SODIUM 40 MG IV SOLR
40.0000 mg | Freq: Two times a day (BID) | INTRAVENOUS | Status: DC
  Administered 2020-04-22 – 2020-04-25 (×7): 40 mg via INTRAVENOUS
  Filled 2020-04-22 (×8): qty 40

## 2020-04-22 MED ORDER — HEPARIN SOD (PORK) LOCK FLUSH 100 UNIT/ML IV SOLN
INTRAVENOUS | Status: AC
  Filled 2020-04-22: qty 5

## 2020-04-22 MED ORDER — KETAMINE HCL 50 MG/5ML IJ SOSY
PREFILLED_SYRINGE | INTRAMUSCULAR | Status: AC
  Filled 2020-04-22: qty 5

## 2020-04-22 MED ORDER — CHLORHEXIDINE GLUCONATE CLOTH 2 % EX PADS: 6.0000 | MEDICATED_PAD | Freq: Once | CUTANEOUS | Status: DC

## 2020-04-22 MED ORDER — SUCCINYLCHOLINE CHLORIDE 20 MG/ML IJ SOLN
INTRAMUSCULAR | Status: DC | PRN
  Administered 2020-04-22: 120 mg via INTRAVENOUS

## 2020-04-22 MED ORDER — PIPERACILLIN-TAZOBACTAM 3.375 G IVPB
3.3750 g | Freq: Three times a day (TID) | INTRAVENOUS | Status: DC
  Administered 2020-04-22 – 2020-05-06 (×43): 3.375 g via INTRAVENOUS
  Filled 2020-04-22 (×41): qty 50

## 2020-04-22 MED ORDER — ONDANSETRON HCL 4 MG/2ML IJ SOLN
4.0000 mg | Freq: Four times a day (QID) | INTRAMUSCULAR | Status: DC | PRN
  Administered 2020-04-25 – 2020-05-12 (×3): 4 mg via INTRAVENOUS
  Filled 2020-04-22 (×3): qty 2

## 2020-04-22 MED ORDER — ESMOLOL HCL 100 MG/10ML IV SOLN
INTRAVENOUS | Status: DC | PRN
  Administered 2020-04-22: 30 mg via INTRAVENOUS

## 2020-04-22 MED ORDER — PANTOPRAZOLE SODIUM 40 MG IV SOLR
40.0000 mg | Freq: Two times a day (BID) | INTRAVENOUS | Status: DC
  Administered 2020-04-22: 40 mg via INTRAVENOUS
  Filled 2020-04-22: qty 40

## 2020-04-22 MED ORDER — KETAMINE HCL 10 MG/ML IJ SOLN
INTRAMUSCULAR | Status: DC | PRN
  Administered 2020-04-22: 10 mg via INTRAVENOUS

## 2020-04-22 MED ORDER — SODIUM CHLORIDE 0.9% FLUSH
10.0000 mL | Freq: Two times a day (BID) | INTRAVENOUS | Status: DC
  Administered 2020-04-22 – 2020-04-25 (×7): 10 mL

## 2020-04-22 MED ORDER — NOREPINEPHRINE 4 MG/250ML-% IV SOLN
0.0000 ug/min | INTRAVENOUS | Status: DC
  Administered 2020-04-22: 3 ug/min via INTRAVENOUS
  Administered 2020-04-22: 8 ug/min via INTRAVENOUS
  Administered 2020-04-23: 4 ug/min via INTRAVENOUS
  Filled 2020-04-22 (×2): qty 250

## 2020-04-22 MED ORDER — ONDANSETRON 4 MG PO TBDP: 4.0000 mg | ORAL_TABLET | Freq: Four times a day (QID) | ORAL | Status: DC | PRN

## 2020-04-22 MED ORDER — LACTATED RINGERS IV SOLN
INTRAVENOUS | Status: DC
  Administered 2020-04-22: 1000 mL via INTRAVENOUS

## 2020-04-22 MED ORDER — SUGAMMADEX SODIUM 200 MG/2ML IV SOLN
INTRAVENOUS | Status: DC | PRN
  Administered 2020-04-22: 200 mg via INTRAVENOUS

## 2020-04-22 MED ORDER — PHENOL 1.4 % MT LIQD: 1.0000 | OROMUCOSAL | Status: DC | PRN

## 2020-04-22 MED ORDER — BUPIVACAINE LIPOSOME 1.3 % IJ SUSP
INTRAMUSCULAR | Status: AC
  Filled 2020-04-22: qty 10

## 2020-04-22 MED ORDER — LORAZEPAM 2 MG/ML IJ SOLN
1.0000 mg | INTRAMUSCULAR | Status: DC | PRN
  Administered 2020-04-23 – 2020-05-01 (×4): 1 mg via INTRAVENOUS
  Filled 2020-04-22 (×4): qty 1

## 2020-04-22 MED ORDER — CHLORHEXIDINE GLUCONATE 0.12 % MT SOLN: 15.0000 mL | Freq: Once | OROMUCOSAL | Status: DC

## 2020-04-22 MED ORDER — LACTATED RINGERS IV SOLN
Freq: Once | INTRAVENOUS | Status: AC
  Administered 2020-04-22: 1000 mL via INTRAVENOUS

## 2020-04-22 MED ORDER — PROMETHAZINE HCL 25 MG/ML IJ SOLN: 6.2500 mg | INTRAMUSCULAR | Status: DC | PRN

## 2020-04-22 MED ORDER — ORAL CARE MOUTH RINSE: 15.0000 mL | Freq: Once | OROMUCOSAL | Status: DC

## 2020-04-22 MED ORDER — MEPERIDINE HCL 50 MG/ML IJ SOLN: 6.2500 mg | INTRAMUSCULAR | Status: DC | PRN

## 2020-04-22 MED ORDER — PROPOFOL 10 MG/ML IV BOLUS
INTRAVENOUS | Status: DC | PRN
  Administered 2020-04-22: 40 mg via INTRAVENOUS

## 2020-04-22 MED ORDER — MIDAZOLAM HCL 2 MG/2ML IJ SOLN
INTRAMUSCULAR | Status: AC
  Filled 2020-04-22: qty 2

## 2020-04-22 MED ORDER — LACTATED RINGERS IV BOLUS
1000.0000 mL | Freq: Once | INTRAVENOUS | Status: AC
  Administered 2020-04-22: 1000 mL via INTRAVENOUS

## 2020-04-22 MED ORDER — FENTANYL CITRATE (PF) 250 MCG/5ML IJ SOLN
INTRAMUSCULAR | Status: AC
  Filled 2020-04-22: qty 10

## 2020-04-22 MED ORDER — CHLORHEXIDINE GLUCONATE CLOTH 2 % EX PADS
6.0000 | MEDICATED_PAD | Freq: Every day | CUTANEOUS | Status: DC
  Administered 2020-04-22 – 2020-05-17 (×14): 6 via TOPICAL

## 2020-04-22 MED ORDER — ENOXAPARIN SODIUM 40 MG/0.4ML ~~LOC~~ SOLN
40.0000 mg | SUBCUTANEOUS | Status: DC
  Administered 2020-04-23 – 2020-04-30 (×8): 40 mg via SUBCUTANEOUS
  Filled 2020-04-22 (×8): qty 0.4

## 2020-04-22 MED ORDER — HYDROMORPHONE HCL 1 MG/ML IJ SOLN: 0.2500 mg | INTRAMUSCULAR | Status: DC | PRN

## 2020-04-22 MED ORDER — LISINOPRIL 10 MG PO TABS
10.0000 mg | ORAL_TABLET | Freq: Every day | ORAL | Status: DC
  Filled 2020-04-22: qty 1

## 2020-04-22 MED ORDER — SODIUM CHLORIDE 0.9 % IV SOLN: INTRAVENOUS | Status: DC

## 2020-04-22 MED ORDER — SODIUM CHLORIDE 0.45 % IV SOLN: INTRAVENOUS | Status: DC

## 2020-04-22 MED ORDER — LACTATED RINGERS IV SOLN: INTRAVENOUS | Status: DC | PRN

## 2020-04-22 MED ORDER — ACETAMINOPHEN 325 MG PO TABS
650.0000 mg | ORAL_TABLET | Freq: Four times a day (QID) | ORAL | Status: DC | PRN
  Administered 2020-04-25 – 2020-05-09 (×5): 650 mg via ORAL
  Filled 2020-04-22 (×4): qty 2

## 2020-04-22 MED ORDER — ROCURONIUM BROMIDE 100 MG/10ML IV SOLN
INTRAVENOUS | Status: DC | PRN
  Administered 2020-04-22 (×2): 50 mg via INTRAVENOUS

## 2020-04-22 MED ORDER — SODIUM CHLORIDE 0.9% FLUSH: 10.0000 mL | INTRAVENOUS | Status: DC | PRN

## 2020-04-22 MED ORDER — GADOBUTROL 1 MMOL/ML IV SOLN
7.0000 mL | Freq: Once | INTRAVENOUS | Status: AC | PRN
  Administered 2020-04-22: 7 mL via INTRAVENOUS

## 2020-04-22 MED ORDER — HYDROCHLOROTHIAZIDE 12.5 MG PO CAPS
12.5000 mg | ORAL_CAPSULE | Freq: Every day | ORAL | Status: DC
  Filled 2020-04-22 (×2): qty 1

## 2020-04-22 MED ORDER — FENTANYL CITRATE (PF) 100 MCG/2ML IJ SOLN
25.0000 ug | INTRAMUSCULAR | Status: DC | PRN
  Administered 2020-04-22 – 2020-04-23 (×4): 25 ug via INTRAVENOUS
  Filled 2020-04-22 (×4): qty 2

## 2020-04-22 MED ORDER — LIDOCAINE 2% (20 MG/ML) 5 ML SYRINGE
INTRAMUSCULAR | Status: AC
  Filled 2020-04-22: qty 5

## 2020-04-22 SURGICAL SUPPLY — 71 items
APPLIER CLIP 11 MED OPEN (CLIP)
APPLIER CLIP 13 LRG OPEN (CLIP)
BARRIER SKIN 2 3/4 (OSTOMY) IMPLANT
BARRIER SKIN 2 3/4 INCH (OSTOMY)
BARRIER SKIN OD2.25 2 3/4 FLNG (OSTOMY) IMPLANT
CELLS DAT CNTRL 66122 CELL SVR (MISCELLANEOUS) ×3 IMPLANT
CHLORAPREP W/TINT 26 (MISCELLANEOUS) ×5 IMPLANT
CLAMP POUCH DRAINAGE QUIET (OSTOMY) IMPLANT
CLIP APPLIE 11 MED OPEN (CLIP) IMPLANT
CLIP APPLIE 13 LRG OPEN (CLIP) IMPLANT
CLOTH BEACON ORANGE TIMEOUT ST (SAFETY) ×5 IMPLANT
COVER LIGHT HANDLE STERIS (MISCELLANEOUS) ×10 IMPLANT
COVER WAND RF STERILE (DRAPES) ×5 IMPLANT
DRAPE WARM FLUID 44X44 (DRAPES) ×5 IMPLANT
DRSG OPSITE POSTOP 4X10 (GAUZE/BANDAGES/DRESSINGS) ×2 IMPLANT
DRSG OPSITE POSTOP 4X8 (GAUZE/BANDAGES/DRESSINGS) IMPLANT
ELECT BLADE 6 FLAT ULTRCLN (ELECTRODE) ×2 IMPLANT
ELECT REM PT RETURN 9FT ADLT (ELECTROSURGICAL) ×5
ELECTRODE REM PT RTRN 9FT ADLT (ELECTROSURGICAL) ×3 IMPLANT
EVACUATOR DRAINAGE 10X20 100CC (DRAIN) IMPLANT
EVACUATOR SILICONE 100CC (DRAIN) ×2
GLOVE BIO SURGEON STRL SZ 6.5 (GLOVE) ×5 IMPLANT
GLOVE BIO SURGEONS STRL SZ 6.5 (GLOVE) ×2
GLOVE BIOGEL PI IND STRL 6.5 (GLOVE) ×3 IMPLANT
GLOVE BIOGEL PI IND STRL 7.0 (GLOVE) ×6 IMPLANT
GLOVE BIOGEL PI INDICATOR 6.5 (GLOVE) ×6
GLOVE BIOGEL PI INDICATOR 7.0 (GLOVE) ×4
GLOVE SURG SS PI 7.5 STRL IVOR (GLOVE) ×5 IMPLANT
GOWN STRL REUS W/TWL LRG LVL3 (GOWN DISPOSABLE) ×17 IMPLANT
HANDLE SUCTION POOLE (INSTRUMENTS) ×3 IMPLANT
INST SET MAJOR GENERAL (KITS) ×5 IMPLANT
KIT REMOVER STAPLE SKIN (MISCELLANEOUS) IMPLANT
KIT TURNOVER KIT A (KITS) ×5 IMPLANT
LIGASURE IMPACT 36 18CM CVD LR (INSTRUMENTS) ×2 IMPLANT
MANIFOLD NEPTUNE II (INSTRUMENTS) ×5 IMPLANT
NDL HYPO 18GX1.5 BLUNT FILL (NEEDLE) ×3 IMPLANT
NEEDLE HYPO 18GX1.5 BLUNT FILL (NEEDLE) ×5 IMPLANT
NEEDLE HYPO 22GX1.5 SAFETY (NEEDLE) ×5 IMPLANT
NS IRRIG 1000ML POUR BTL (IV SOLUTION) ×12 IMPLANT
PACK ABDOMINAL MAJOR (CUSTOM PROCEDURE TRAY) ×2 IMPLANT
PAD ARMBOARD 7.5X6 YLW CONV (MISCELLANEOUS) ×5 IMPLANT
PENCIL SMOKE EVACUATOR COATED (MISCELLANEOUS) ×5 IMPLANT
POUCH OSTOMY 2 3/4  H 3804 (WOUND CARE)
POUCH OSTOMY 2 PC DRNBL 2.75 (WOUND CARE) IMPLANT
RELOAD LINEAR CUT PROX 55 BLUE (ENDOMECHANICALS) IMPLANT
RELOAD PROXIMATE 75MM BLUE (ENDOMECHANICALS) IMPLANT
RELOAD STAPLE 55 3.8 BLU REG (ENDOMECHANICALS) IMPLANT
RELOAD STAPLE 75 3.8 BLU REG (ENDOMECHANICALS) IMPLANT
RETRACTOR WND ALEXIS 18 MED (MISCELLANEOUS) IMPLANT
RETRACTOR WND ALEXIS 25 LRG (MISCELLANEOUS) IMPLANT
RTRCTR WOUND ALEXIS 18CM MED (MISCELLANEOUS) ×5
RTRCTR WOUND ALEXIS 25CM LRG (MISCELLANEOUS)
SET BASIN LINEN APH (SET/KITS/TRAYS/PACK) ×5 IMPLANT
SPONGE DRAIN TRACH 4X4 STRL 2S (GAUZE/BANDAGES/DRESSINGS) ×2 IMPLANT
SPONGE LAP 18X18 RF (DISPOSABLE) ×5 IMPLANT
STAPLER GUN LINEAR PROX 60 (STAPLE) IMPLANT
STAPLER PROXIMATE 55 BLUE (STAPLE) IMPLANT
STAPLER PROXIMATE 75MM BLUE (STAPLE) IMPLANT
STAPLER VISISTAT (STAPLE) ×5 IMPLANT
SUCTION POOLE HANDLE (INSTRUMENTS) ×5
SUT CHROMIC 0 SH (SUTURE) IMPLANT
SUT CHROMIC 2 0 SH (SUTURE) IMPLANT
SUT CHROMIC 3 0 SH 27 (SUTURE) IMPLANT
SUT ETHILON 3 0 FSL (SUTURE) ×2 IMPLANT
SUT PDS AB CT VIOLET #0 27IN (SUTURE) ×10 IMPLANT
SUT PROLENE 2 0 SH 30 (SUTURE) IMPLANT
SUT SILK 3 0 SH CR/8 (SUTURE) ×5 IMPLANT
SWAB CULTURE LIQ STUART DBL (MISCELLANEOUS) ×2 IMPLANT
SYR 20ML LL LF (SYRINGE) ×10 IMPLANT
TAPE HYPAFIX 4 X30'CHARGABLE (GAUZE/BANDAGES/DRESSINGS) ×2 IMPLANT
TRAY FOLEY MTR SLVR 16FR STAT (SET/KITS/TRAYS/PACK) ×3 IMPLANT

## 2020-04-22 NOTE — Consult Note (Addendum)
Referring Provider: Dr. Manuella Ghazi  Primary Care Physician:  Rosita Fire, MD Primary Gastroenterologist:  Dr. Gala Romney   Date of Admission: 04/21/20 Date of Consultation: 03/3020  Reason for Consultation:  Abdominal pain   HPI:  Maria Murphy is a 64 y.o. year old female who presented to the ED on 04/20/20 and evaluated for possible stroke but negative evaluation. She was sent home but returned again the next day with worsening diffuse abdominal pain. CT 8/28 with cholelithiasis but no inflammatory changes. She did have coarse calcifications in pancreatic head consistent with chronic pancreaittis and pancreatic lesions. Small hiatal hernia with mild distal esophageal wall thickening. Further findings as below. MRI/MRCP results pending from this morning. Bilirubin elevated at 3.0 on 8/28, increased to 4.3 yesterday and now 1.3 this morning. Transaminases and alk phos normal. Lipase normal at 33. CBC without leukocytosis or anemia. GI consulted due to persistent pain.    Patient is a difficult historian. Sister at bedside. Patient notes abdominal pain "all over". 8/28 went to ED and then sent home. States acute onset of abdominal pain on 8/28, worsening. Associated nausea. No vomiting. No chronic GERD symptoms. Pepcid at home. Naproxen BID for 2 weeks due to joint pain. Celebrex on outpatient list as well. No aspirin powders.  Drank a little wine for past 2 weeks every day for joint pain. Drank one glass a day. Prior to this, she would drink one glass of wine on weekends with meal.  +Chills, no fever. Previously did not drink alcohol daily. Pain is described as diffuse but moreso in RUQ currently.   No prior colonoscopy/EGD. Lives at home alone.     Past Medical History:  Diagnosis Date  . Essential hypertension, benign 01/24/2015  . Foot ulcer ([place])   . Hypertension   . Memory loss   . Skin cancer   . Vision abnormalities     Past Surgical History:  Procedure Laterality Date  . TONSILLECTOMY       Prior to Admission medications   Medication Sig Start Date End Date Taking? Authorizing Provider  donepezil (ARICEPT) 10 MG tablet Take 5 mg by mouth daily. 04/02/20  Yes [provider]  acetaminophen (TYLENOL) 500 MG tablet Take 1 tablet (500 mg total) by mouth every 6 (six) hours as needed. 08/10/18   Evalee Jefferson, PA-C  celecoxib (CELEBREX) 100 MG capsule Take 1 capsule (100 mg total) by mouth 2 (two) times daily. 04/21/20   Margarita Mail, PA-C  citalopram (CELEXA) 20 MG tablet Take 1 tablet (20 mg total) by mouth daily. 08/16/19   Jaynee Eagles, PA-C  famotidine (PEPCID) 20 MG tablet Take 1 tablet (20 mg total) by mouth 2 (two) times daily. 04/21/20   Harris, Vernie Shanks, PA-C  gabapentin (NEURONTIN) 300 MG capsule TAKE 1 CAPSULE BY MOUTH TWICE DAILY 08/23/19   [provider]  Ginger, Zingiber officinalis, (GINGER PO) Take 1 tablet by mouth.     [provider]  ibuprofen (ADVIL,MOTRIN) 400 MG tablet Take 1 tablet (400 mg total) by mouth every 6 (six) hours as needed for moderate pain. 08/10/18   Evalee Jefferson, PA-C  lisinopril-hydrochlorothiazide (PRINZIDE,ZESTORETIC) 10-12.5 MG tablet Take 1 tablet by mouth daily. 08/07/18   [provider]  methocarbamol (ROBAXIN) 500 MG tablet Take 1 tablet (500 mg total) by mouth every 8 (eight) hours as needed for muscle spasms. 03/26/20   Rancour, Annie Main, MD  naproxen (NAPROSYN) 500 MG tablet Take 1 tablet (500 mg total) by mouth 2 (two) times daily as needed.  03/26/20   Rancour, Jeannett Senior, MD  ondansetron (ZOFRAN) 4 MG tablet Take 1 tablet (4 mg total) by mouth every 8 (eight) hours as needed for nausea or vomiting. 04/21/20   Arthor Captain, PA-C  traZODone (DESYREL) 100 MG tablet Take 100 mg by mouth at bedtime.  08/02/19   [provider]    Current Facility-Administered Medications  Medication Dose Route Frequency Provider Last Rate Last Admin  . 0.45 % sodium chloride infusion   Intravenous Continuous Norins,  Rosalyn Gess, MD 50 mL/hr at 04/22/20 0052 New Bag at 04/22/20 0052  . 0.9 %  sodium chloride infusion  250 mL Intravenous PRN Norins, Rosalyn Gess, MD      . acetaminophen (TYLENOL) tablet 500 mg  500 mg Oral Q6H PRN Norins, Rosalyn Gess, MD      . citalopram (CELEXA) tablet 20 mg  20 mg Oral Daily Norins, Rosalyn Gess, MD      . donepezil (ARICEPT) tablet 5 mg  5 mg Oral Daily Norins, Rosalyn Gess, MD      . gabapentin (NEURONTIN) capsule 300 mg  300 mg Oral BID Norins, Rosalyn Gess, MD   300 mg at 04/22/20 0059  . heparin injection 5,000 Units  5,000 Units Subcutaneous Q8H Norins, Rosalyn Gess, MD      . lisinopril (ZESTRIL) tablet 10 mg  10 mg Oral Daily Norins, Rosalyn Gess, MD       And  . hydrochlorothiazide (MICROZIDE) capsule 12.5 mg  12.5 mg Oral Daily Norins, Rosalyn Gess, MD      . ibuprofen (ADVIL) tablet 400 mg  400 mg Oral Q6H PRN Norins, Rosalyn Gess, MD      . ketorolac (TORADOL) 30 MG/ML injection 30 mg  30 mg Intravenous Q6H PRN Norins, Rosalyn Gess, MD      . methocarbamol (ROBAXIN) tablet 500 mg  500 mg Oral Q8H PRN Norins, Rosalyn Gess, MD      . morphine 4 MG/ML injection 4 mg  4 mg Intravenous Q4H PRN Norins, Rosalyn Gess, MD   4 mg at 04/22/20 0247  . ondansetron (ZOFRAN) injection 4 mg  4 mg Intravenous Q6H Norins, Rosalyn Gess, MD   4 mg at 04/22/20 1033  . piperacillin-tazobactam (ZOSYN) IVPB 3.375 g  3.375 g Intravenous Q8H Norins, Rosalyn Gess, MD 12.5 mL/hr at 04/22/20 1030 3.375 g at 04/22/20 1030  . sodium chloride 0.9 % bolus 1,000 mL  1,000 mL Intravenous Once Sherryll Burger, Pratik D, DO      . sodium chloride flush (NS) 0.9 % injection 3 mL  3 mL Intravenous Q12H Norins, Michael E, MD      . sodium chloride flush (NS) 0.9 % injection 3 mL  3 mL Intravenous PRN Norins, Rosalyn Gess, MD      . traZODone (DESYREL) tablet 100 mg  100 mg Oral QHS Norins, Rosalyn Gess, MD   100 mg at 04/22/20 0100    Allergies as of 04/21/2020 - Review Complete 04/21/2020  Allergen Reaction Noted  . Wheat extract Swelling 10/29/2017     Family History  Problem Relation Age of Onset  . High Cholesterol Mother   . Hypertension Mother   . Arthritis/Rheumatoid Mother   . Alcohol abuse Father   . Colon cancer Neg Hx   . Colon polyps Neg Hx     Social History   Socioeconomic History  . Marital status: Widowed    Spouse name: Not on file  . Number of children: Not on file  . Years of  education: Not on file  . Highest education level: Not on file  Occupational History  . Not on file  Tobacco Use  . Smoking status: Never Smoker  . Smokeless tobacco: Never Used  Vaping Use  . Vaping Use: Never used  Substance and Sexual Activity  . Alcohol use: No    Alcohol/week: 1.0 standard drink    Types: 1 Glasses of wine per week    Comment: drank alcohol for past 2 weeks, one glass of wine. Prior to this would drink glass of alcohol on weekends with dinner.   . Drug use: No  . Sexual activity: Not Currently    Birth control/protection: Post-menopausal  Other Topics Concern  . Not on file  Social History Narrative  . Not on file   Social Determinants of Health   Financial Resource Strain:   . Difficulty of Paying Living Expenses: Not on file  Food Insecurity:   . Worried About Charity fundraiser in the Last Year: Not on file  . Ran Out of Food in the Last Year: Not on file  Transportation Needs:   . Lack of Transportation (Medical): Not on file  . Lack of Transportation (Non-Medical): Not on file  Physical Activity:   . Days of Exercise per Week: Not on file  . Minutes of Exercise per Session: Not on file  Stress:   . Feeling of Stress : Not on file  Social Connections:   . Frequency of Communication with Friends and Family: Not on file  . Frequency of Social Gatherings with Friends and Family: Not on file  . Attends Religious Services: Not on file  . Active Member of Clubs or Organizations: Not on file  . Attends Archivist Meetings: Not on file  . Marital Status: Not on file  Intimate  Partner Violence:   . Fear of Current or Ex-Partner: Not on file  . Emotionally Abused: Not on file  . Physically Abused: Not on file  . Sexually Abused: Not on file    Review of Systems: Gen: see HPI CV: Denies chest pain, heart palpitations, syncope, edema  Resp: Denies shortness of breath with rest, cough, wheezing GI: see HPI GU : Denies urinary burning, urinary frequency, urinary incontinence.  MS: see HPI Derm: Denies rash, itching, dry skin Psych: Denies depression, anxiety,confusion, or memory loss Heme: Denies bruising, bleeding, and enlarged lymph nodes.  Physical Exam: Vital signs in last 24 hours: Temp:  [97.6 F (36.4 C)-98.5 F (36.9 C)] 97.6 F (36.4 C) (08/30 1059) Pulse Rate:  [59-76] 63 (08/30 1059) Resp:  [14-20] 14 (08/30 1059) BP: (64-159)/(42-98) 73/52 (08/30 1059) SpO2:  [86 %-100 %] 100 % (08/30 1044) Weight:  [90.5 kg-90.7 kg] 90.5 kg (08/30 0037)   General:   Alert,  Uncomfortable and in pain at time of consultation, spitting up clear phlegm. Head:  Normocephalic and atraumatic. Eyes:  Sclera clear, no icterus. Ears:  Normal auditory acuity. Nose:  No deformity, discharge,  or lesions. Mouth:  No deformity or lesions, dentition normal. Lungs:  Clear throughout to auscultation.   Heart:  S1 S2 present without obvious murmurs. Abdomen:  Soft, obese, TTP diffusely but more concentrated RUQ/right-sided abdomen and epigastric. No masses, hepatosplenomegaly or hernias noted. Hypoactive bowel sounds Rectal:  Deferred  Msk:  Symmetrical without gross deformities. Normal posture. Pulses:  Normal pulses noted. Extremities:  Without  edema. Neurologic:  Alert and  oriented x 4 Psych:  Alert and cooperative. Normal mood and affect.  Intake/Output from previous day: 08/29 0701 - 08/30 0700 In: 130.6 [I.V.:106.7; IV Piggyback:23.9] Out: -  Intake/Output this shift: No intake/output data recorded.  Lab Results: Recent Labs    04/20/20 2022  04/21/20 1835  WBC 7.5 9.3  HGB 12.4 13.6  HCT 37.1 40.1  PLT 173 175   BMET Recent Labs    04/20/20 2022 04/21/20 1835 04/22/20 0629  NA 141 138 139  K 4.1 3.7 3.8  CL 105 105 108  CO2 22 21* 20*  GLUCOSE 97 94 130*  BUN 24* 20 24*  CREATININE 1.31* 1.03* 0.98  CALCIUM 8.8* 8.8* 8.3*   LFT Recent Labs    04/20/20 2022 04/21/20 1835 04/22/20 0629  PROT 7.3 7.8 6.4*  ALBUMIN 4.2 4.5 3.8  AST $Re'22 18 21  'YQl$ ALT $R'9 9 7  'pw$ ALKPHOS 37* 37* 27*  BILITOT 3.0* 4.3* 1.3*    Studies/Results: CT Head Wo Contrast  Result Date: 04/20/2020 CLINICAL DATA:  Mental status change EXAM: CT HEAD WITHOUT CONTRAST TECHNIQUE: Contiguous axial images were obtained from the base of the skull through the vertex without intravenous contrast. COMPARISON:  CT brain 03/19/2020 FINDINGS: Brain: No acute territorial infarction, hemorrhage, or intracranial mass. The ventricles are nonenlarged. Vascular: No hyperdense vessels.  No unexpected calcification. Skull: Normal. Negative for fracture or focal lesion. Sinuses/Orbits: No acute finding. Other: None IMPRESSION: Negative non contrasted CT appearance of the brain. Electronically Signed   By: Donavan Foil M.D.   On: 04/20/2020 21:14   CT ABDOMEN PELVIS W CONTRAST  Result Date: 04/20/2020 CLINICAL DATA:  Acute abdominal pain. Elevated bilirubin. Altered mental status. EXAM: CT ABDOMEN AND PELVIS WITH CONTRAST TECHNIQUE: Multidetector CT imaging of the abdomen and pelvis was performed using the standard protocol following bolus administration of intravenous contrast. CONTRAST:  37mL OMNIPAQUE IOHEXOL 300 MG/ML  SOLN COMPARISON:  No prior abdominal imaging. FINDINGS: Lower chest: Mild hypoventilatory changes in the lung bases. No pleural fluid or confluent airspace disease. Hepatobiliary: 7 mm hypodensity in the right dome of the liver, series 2, image 12, too small to accurately characterize. Multiple cholesterol gallstones in the gallbladder. No pericholecystic  inflammation or evidence of wall thickening. No common bile duct dilatation. Pancreas: Coarse calcification in the pancreatic head. There are at least 3 cystic lesions in the pancreatic head/uncinate process, largest measuring 2.2 cm, series 2, image 29. There is an adjacent 1.3 cm cystic structure and a 1 cm cyst more inferiorly. No definite peripancreatic fat stranding or inflammation. There is no pancreatic ductal dilatation. Spleen: Normal in size without focal abnormality. Adrenals/Urinary Tract: No adrenal nodule. There is a punctate calcification involving the medial limb of the left adrenal gland. Indeterminate lesion arising from the upper right kidney measures 17 x 18 mm has internal heterogeneity and peripheral calcifications. This is adjacent to a small cortical cyst. There are scattered additional cysts within the right kidney as well as parapelvic cysts. Cortical as well as parapelvic cysts in the left kidney. No hydronephrosis. No perinephric edema. Urinary bladder is unremarkable. Stomach/Bowel: Small hiatal hernia with mild distal esophageal wall thickening. Stomach is unremarkable. Normal positioning of the duodenum and ligament of Treitz. There is no small bowel obstruction or inflammatory change. Cecum is high-riding in the right mid abdomen. Normal air-filled appendix. Transverse colonic tortuosity. Colonic diverticulosis involving the distal descending and sigmoid colon. No acute diverticulitis. Vascular/Lymphatic: Aortic atherosclerosis with mild tortuosity. No aortic aneurysm. The portal vein is patent. There is bi-iliac tortuosity. No bulky enlarged abdominopelvic lymph nodes.  Reproductive: Globular enlarged uterus with multiple presumed fibroids, the largest appears projecting posteriorly and measures 5.6 cm. Right ovary tentatively visualized and normal. The left ovary is not definitively seen. Other: No free air or free fluid. Musculoskeletal: Multilevel degenerative change in the spine.  Vacuum phenomena at L4-L5 and L5-S1. Prominent facet hypertrophy. Modic endplate changes at H08-M57. There are no acute or suspicious osseous abnormalities. IMPRESSION: 1. Cholelithiasis without gallbladder inflammation. No biliary dilatation. 2. Coarse calcification in the pancreatic head consistent with chronic pancreatitis. There are at least 3 cystic lesions in the pancreatic head/uncinate process, largest measuring 2.2 cm. These may represent sequela of prior pancreatitis and pseudocysts, however recommend further evaluation with pancreatic protocol MRI for further characterization. This should ideally be performed on an elective basis after resolution of acute event when patient is able to tolerate breath hold technique. 3. Indeterminate 17 x 18 mm lesion arising from the upper right kidney has internal heterogeneity and peripheral calcifications. 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 Recommend MRI characterization of this lesion. There are additional simple cysts as well as parapelvic cysts in both kidneys. 4. Globular enlarged uterus with multiple presumed fibroids, the largest measuring 5.6 cm. 5. Small hiatal hernia with mild distal esophageal wall thickening, can be seen with reflux or esophagitis. 6. Colonic diverticulosis without acute inflammation. Aortic Atherosclerosis (ICD10-I70.0). Electronically Signed   By: Keith Rake M.D.   On: 04/20/2020 23:14    DG Chest Port 1 View  Result Date: 04/20/2020 CLINICAL DATA:  Altered mental status. EXAM: PORTABLE CHEST 1 VIEW COMPARISON:  September 09, 2014 FINDINGS: Mild, diffuse chronic appearing increased lung markings are seen without evidence of acute infiltrate, pleural effusion or pneumothorax. The cardiac silhouette is mildly enlarged. The visualized skeletal structures are unremarkable. IMPRESSION: Chronic appearing increased lung markings without evidence of acute or active  cardiopulmonary disease. Electronically Signed   By: Virgina Norfolk M.D.   On: 04/20/2020 21:05     Impression: 64 year old female with diffuse abdominal pain, worsening and moreso now located in epigastric and RUQ, with findings of cholelithiasis on CT without gallbladder inflammation, pancreatic calcifications consistent with chronic pancreatitis and cystic lesions, small hiatal hernia with mild distal esophageal wall thickening, further findings as above. Bilirubin non-specifically elevated but now trending down, with transaminases remaining normal.   MRI/MRCP resulted just after consultation with patient. Findings of pneumoperitoneum, concerning for perforated viscus organ, possible perforated gastric ulceration. Marked decline in patient per nursing staff with SBP in 50s/60s, somnolent after consultation. Went back to patient's bedside, discussed with Dr. Manuella Ghazi, who also met patient at bedside. Rapid response team called. Dr. Constance Haw aware. Patient to be transferred to ICU. NS bolus started. Further care per hospitalist and surgical team. I have attempted to call patient's sister, who was at bedside earlier this morning. I have not been able to contact or leave message.     Plan: To ICU Surgical Consult Further care per attending GI will follow peripherally Attempting to make contact with sister regarding patient's decline but have been unable to reach successfully yet.   Annitta Needs, PhD, ANP-BC [facility]     LOS: 0 days    04/22/2020, 11:02 AM

## 2020-04-22 NOTE — Progress Notes (Signed)
   04/22/20 1044  Assess: MEWS Score  Temp 97.6 F (36.4 C)  BP (!) 64/42  Pulse Rate 65  Resp 16  Level of Consciousness Alert  SpO2 100 %  O2 Device Room Air  Patient Activity (if Appropriate) In bed  Assess: MEWS Score  MEWS Temp 0  MEWS Systolic 3  MEWS Pulse 0  MEWS RR 0  MEWS LOC 0  MEWS Score 3  MEWS Score Color Yellow  Assess: if the MEWS score is Yellow or Red  Were vital signs taken at a resting state? Yes  Focused Assessment Change from prior assessment (see assessment flowsheet)  Early Detection of Sepsis Score *See Row Information* Medium  MEWS guidelines implemented *See Row Information* Yes  Treat  Pain Scale 0-10  Pain Score 10  Pain Type Acute pain  Pain Location Abdomen  Pain Orientation Left;Right;Mid  Pain Descriptors / Indicators Aching  Pain Frequency Constant  Pain Onset On-going  Patients Stated Pain Goal 0  Pain Intervention(s) MD notified (Comment);Emotional support;Environmental changes;Rest;Relaxation  Multiple Pain Sites No  Take Vital Signs  Increase Vital Sign Frequency  Yellow: Q 2hr X 2 then Q 4hr X 2, if remains yellow, continue Q 4hrs  Escalate  MEWS: Escalate Yellow: discuss with charge nurse/RN and consider discussing with provider and RRT  Notify: Charge Nurse/RN  Name of Charge Nurse/RN Notified L Shayann Garbutt   Date Charge Nurse/RN Notified 04/22/20  Time Charge Nurse/RN Notified 1044  Notify: Provider  Provider Name/Title Dr. Manuella Ghazi  Date Provider Notified 04/22/20  Time Provider Notified 1044  Notification Type Page  Notification Reason Change in status  Response See new orders  Date of Provider Response 04/22/20  Time of Provider Response 0277  Notify: Rapid Response  Name of Rapid Response RN Notified Tiffany Vogler  Date Rapid Response Notified 04/22/20  Time Rapid Response Notified 4128  Document  Patient Outcome Transferred/level of care increased  Progress note created (see row info) Yes

## 2020-04-22 NOTE — Progress Notes (Signed)
PROGRESS NOTE    Maria Murphy  NWG:[id number] DOB: [birth year] DOA: 04/21/2020 PCP: Avon Gully, MD   Brief Narrative:  Per HPI: Maria Murphy is a 64 y.o. female with medical history significant of HTN, decreased memory on medication. Maria Murphy seen in [facility] last night, 04/20/20 and evaluated for possible code stroke with negative results.She also reports having had waxing and waning of upper abdominal pain for several days. She returns to [facility] today for increasing epigastric pain with vomiting that began this morning. She does admit to drinking more alcohol recently along with taking Aleve for her aches and pains of her joints. She had a CT scan of her abdomen and pelvis 04/20/20 that revealed cholelithiasis without gallbladder inflammation or biliary dilatation. Coarse calcifications were noted in the pancreatic head consistent with chronic pancreatitis. There were 3 cystic lesions in the pancreatic head with the largest measuring 2.2 cm. And she was found to have a bilirubin of 3.  8/30: Patient was admitted with diffuse abdominal pain likely related to chronic pancreatitis and had undergone MRCP this morning with results demonstrating acute pneumoperitoneum and ascites.  She subsequently was noted to be quite hypotensive and a rapid response was called.  She has been transferred to ICU for closer management and has been started on pressors with fluid bolus ongoing.  General surgery notified for urgent need of patient to go to the OR for exploratory laparotomy.  General surgery has discussed with sister.  Assessment & Plan:   Active Problems:   Essential hypertension, benign   Hyperbilirubinemia   Epigastric pain   Perforated viscus  Acute pneumoperitoneum likely secondary to peptic ulcer perforation -Continue on Zosyn for abdominal coverage -PPI IV twice daily -Keep n.p.o. -Continue IV fluid  Acute hypotension likely secondary to above -Monitor in stepdown unit with  pressors initiated -Continue fluid bolus and aggressive IV fluid resuscitation  Chronic pancreatitis with cystic lesions -Continue on aggressive IV fluid and n.p.o. for time being -Likely related to chronic alcohol intake which will need to be used continued  Pancreatic pseudocyst versus mucinous neoplasm -Recommend EGD with FNA at a later date once stable for further evaluation  Hemorrhagic renal cyst/multiple cysts -Continue following repeat imaging, does not appear to be of clinical consequence  History of hypertension -Hold blood pressure agents in the setting of hypotension  Memory impairment -Hold donepezil and keep strict n.p.o. in anticipation for OR management   DVT prophylaxis: Heparin Code Status: Full code Family Communication:  Disposition Plan:  Status is: Observation  The patient will require care spanning > 2 midnights and should be moved to inpatient because: Unsafe d/c plan, IV treatments appropriate due to intensity of illness or inability to take PO and Inpatient level of care appropriate due to severity of illness  Dispo: The patient is from: Home              Anticipated d/c is to: Home              Anticipated d/c date is: > 3 days              Patient currently is not medically stable to d/c.  Consultants:   GI  General surgery  Procedures:   See below  Antimicrobials:  Anti-infectives (From admission, onward)   Start     Dose/Rate Route Frequency Ordered Stop   04/22/20 0100  piperacillin-tazobactam (ZOSYN) IVPB 3.375 g        3.375 g 12.5 mL/hr over 240 Minutes Intravenous Every  8 hours 04/22/20 0002     04/22/20 0000  piperacillin-tazobactam (ZOSYN) IVPB 3.375 g  Status:  Discontinued        3.375 g 100 mL/hr over 30 Minutes Intravenous Every 8 hours 04/21/20 2349 04/22/20 0002      Subjective: Patient seen and evaluated today with noted ongoing abdominal pain after returning from MRCP this morning.  Objective: Vitals:   04/22/20  0435 04/22/20 1044 04/22/20 1059 04/22/20 1113  BP: 110/67 (!) 64/42 (!) 73/52 (!) 92/52  Pulse: 71 65 63 67  Resp: 16 16 14  (!) 31  Temp: 97.9 F (36.6 C) 97.6 F (36.4 C) 97.6 F (36.4 C)   TempSrc:  Oral    SpO2: 92% 100%  96%  Weight:      Height:        Intake/Output Summary (Last 24 hours) at 04/22/2020 1209 Last data filed at 04/22/2020 1137 Gross per 24 hour  Intake 516.74 ml  Output --  Net 516.74 ml   Filed Weights   04/21/20 1352 04/22/20 0037  Weight: 90.7 kg 90.5 kg    Examination:  General exam: Appears to be in moderate distress Respiratory system: Clear to auscultation. Respiratory effort normal. Cardiovascular system: S1 & S2 heard, RRR.  Gastrointestinal system: Abdomen is tender to palpation, minimally distended Central nervous system: Alert and oriented. No focal neurological deficits. Extremities: No edema Skin: No rashes, lesions or ulcers Psychiatry: Flat affect    Data Reviewed: I have personally reviewed following labs and imaging studies  CBC: Recent Labs  Lab 04/20/20 2022 04/21/20 1835  WBC 7.5 9.3  NEUTROABS 5.2 7.8*  HGB 12.4 13.6  HCT 37.1 40.1  MCV 89.2 89.7  PLT 173 175   Basic Metabolic Panel: Recent Labs  Lab 04/20/20 2022 04/21/20 1835 04/22/20 0629  NA 141 138 139  K 4.1 3.7 3.8  CL 105 105 108  CO2 22 21* 20*  GLUCOSE 97 94 130*  BUN 24* 20 24*  CREATININE 1.31* 1.03* 0.98  CALCIUM 8.8* 8.8* 8.3*   GFR: Estimated Creatinine Clearance: 64 mL/min (by C-G formula based on SCr of 0.98 mg/dL). Liver Function Tests: Recent Labs  Lab 04/20/20 2022 04/21/20 1835 04/22/20 0629  AST 22 18 21   ALT 9 9 7   ALKPHOS 37* 37* 27*  BILITOT 3.0* 4.3* 1.3*  PROT 7.3 7.8 6.4*  ALBUMIN 4.2 4.5 3.8   Recent Labs  Lab 04/21/20 1835  LIPASE 33   Recent Labs  Lab 04/20/20 2022  AMMONIA 36*   Coagulation Profile: No results for input(s): INR, PROTIME in the last 168 hours. Cardiac Enzymes: No results for  input(s): CKTOTAL, CKMB, CKMBINDEX, TROPONINI in the last 168 hours. BNP (last 3 results) No results for input(s): PROBNP in the last 8760 hours. HbA1C: No results for input(s): HGBA1C in the last 72 hours. CBG: Recent Labs  Lab 04/20/20 2019  GLUCAP 96   Lipid Profile: No results for input(s): CHOL, HDL, LDLCALC, TRIG, CHOLHDL, LDLDIRECT in the last 72 hours. Thyroid Function Tests: No results for input(s): TSH, T4TOTAL, FREET4, T3FREE, THYROIDAB in the last 72 hours. Anemia Panel: No results for input(s): VITAMINB12, FOLATE, FERRITIN, TIBC, IRON, RETICCTPCT in the last 72 hours. Sepsis Labs: Recent Labs  Lab 04/20/20 2022  LATICACIDVEN 1.4    Recent Results (from the past 240 hour(s))  Urine Culture     Status: None   Collection Time: 04/20/20  8:20 PM   Specimen: Urine, Catheterized  Result Value Ref Range Status  Specimen Description   Final    URINE, CATHETERIZED Performed at [facility], [address], [place], [place] [address]    Special Requests   Final    NONE Performed at [facility], [address] [place], [place] [address]    Culture   Final    NO GROWTH Performed at [facility], [address] [place], [place] [address]    Report Status 04/22/2020 FINAL  Final  SARS Coronavirus 2 by RT PCR (hospital order, performed in [facility] hospital lab) Nasopharyngeal Nasopharyngeal Swab     Status: None   Collection Time: 04/20/20  8:21 PM   Specimen: Nasopharyngeal Swab  Result Value Ref Range Status   SARS Coronavirus 2 NEGATIVE NEGATIVE Final    Comment: (NOTE) SARS-CoV-2 target nucleic acids are NOT DETECTED.  The SARS-CoV-2 RNA is generally detectable in upper and lower respiratory specimens during the acute phase of infection. The lowest concentration of SARS-CoV-2 viral copies this assay can detect is 250 copies / mL. A negative result does not preclude SARS-CoV-2 infection and should not be used as the sole basis for treatment or  other patient management decisions.  A negative result may occur with improper specimen collection / handling, submission of specimen other than nasopharyngeal swab, presence of viral mutation(s) within the areas targeted by this assay, and inadequate number of viral copies (<250 copies / mL). A negative result must be combined with clinical observations, patient history, and epidemiological information.  Fact Sheet for Patients:   BoilerBrush.com.cy  Fact Sheet for Healthcare Providers: https://pope.com/  This test is not yet approved or  cleared by the [place] FDA and has been authorized for detection and/or diagnosis of SARS-CoV-2 by FDA under an Emergency Use Authorization (EUA).  This EUA will remain in effect (meaning this test can be used) for the duration of the COVID-19 declaration under Section 564(b)(1) of the Act, 21 U.S.C. section 360bbb-3(b)(1), unless the authorization is terminated or revoked sooner.  Performed at [facility], [address], [place], [place] [address]   SARS Coronavirus 2 by RT PCR (hospital order, performed in [facility] hospital lab) Nasopharyngeal Nasopharyngeal Swab     Status: None   Collection Time: 04/22/20 12:16 AM   Specimen: Nasopharyngeal Swab  Result Value Ref Range Status   SARS Coronavirus 2 NEGATIVE NEGATIVE Final    Comment: (NOTE) SARS-CoV-2 target nucleic acids are NOT DETECTED.  The SARS-CoV-2 RNA is generally detectable in upper and lower respiratory specimens during the acute phase of infection. The lowest concentration of SARS-CoV-2 viral copies this assay can detect is 250 copies / mL. A negative result does not preclude SARS-CoV-2 infection and should not be used as the sole basis for treatment or other patient management decisions.  A negative result may occur with improper specimen collection / handling, submission of specimen other than nasopharyngeal swab,  presence of viral mutation(s) within the areas targeted by this assay, and inadequate number of viral copies (<250 copies / mL). A negative result must be combined with clinical observations, patient history, and epidemiological information.  Fact Sheet for Patients:   BoilerBrush.com.cy  Fact Sheet for Healthcare Providers: https://pope.com/  This test is not yet approved or  cleared by the [place] FDA and has been authorized for detection and/or diagnosis of SARS-CoV-2 by FDA under an Emergency Use Authorization (EUA).  This EUA will remain in effect (meaning this test can be used) for the duration of the COVID-19 declaration under Section  564(b)(1) of the Act, 21 U.S.C. section 360bbb-3(b)(1), unless the authorization is terminated or revoked sooner.  Performed at [facility], [address] [place], [place] [address]          Radiology Studies: CT Head Wo Contrast  Result Date: 04/20/2020 CLINICAL DATA:  Mental status change EXAM: CT HEAD WITHOUT CONTRAST TECHNIQUE: Contiguous axial images were obtained from the base of the skull through the vertex without intravenous contrast. COMPARISON:  CT brain 03/19/2020 FINDINGS: Brain: No acute territorial infarction, hemorrhage, or intracranial mass. The ventricles are nonenlarged. Vascular: No hyperdense vessels.  No unexpected calcification. Skull: Normal. Negative for fracture or focal lesion. Sinuses/Orbits: No acute finding. Other: None IMPRESSION: Negative non contrasted CT appearance of the brain. Electronically Signed   By: Jasmine Pang M.D.   On: 04/20/2020 21:14   CT ABDOMEN PELVIS W CONTRAST  Result Date: 04/20/2020 CLINICAL DATA:  Acute abdominal pain. Elevated bilirubin. Altered mental status. EXAM: CT ABDOMEN AND PELVIS WITH CONTRAST TECHNIQUE: Multidetector CT imaging of the abdomen and pelvis was performed using the standard protocol following bolus administration of  intravenous contrast. CONTRAST:  75mL OMNIPAQUE IOHEXOL 300 MG/ML  SOLN COMPARISON:  No prior abdominal imaging. FINDINGS: Lower chest: Mild hypoventilatory changes in the lung bases. No pleural fluid or confluent airspace disease. Hepatobiliary: 7 mm hypodensity in the right dome of the liver, series 2, image 12, too small to accurately characterize. Multiple cholesterol gallstones in the gallbladder. No pericholecystic inflammation or evidence of wall thickening. No common bile duct dilatation. Pancreas: Coarse calcification in the pancreatic head. There are at least 3 cystic lesions in the pancreatic head/uncinate process, largest measuring 2.2 cm, series 2, image 29. There is an adjacent 1.3 cm cystic structure and a 1 cm cyst more inferiorly. No definite peripancreatic fat stranding or inflammation. There is no pancreatic ductal dilatation. Spleen: Normal in size without focal abnormality. Adrenals/Urinary Tract: No adrenal nodule. There is a punctate calcification involving the medial limb of the left adrenal gland. Indeterminate lesion arising from the upper right kidney measures 17 x 18 mm has internal heterogeneity and peripheral calcifications. This is adjacent to a small cortical cyst. There are scattered additional cysts within the right kidney as well as parapelvic cysts. Cortical as well as parapelvic cysts in the left kidney. No hydronephrosis. No perinephric edema. Urinary bladder is unremarkable. Stomach/Bowel: Small hiatal hernia with mild distal esophageal wall thickening. Stomach is unremarkable. Normal positioning of the duodenum and ligament of Treitz. There is no small bowel obstruction or inflammatory change. Cecum is high-riding in the right mid abdomen. Normal air-filled appendix. Transverse colonic tortuosity. Colonic diverticulosis involving the distal descending and sigmoid colon. No acute diverticulitis. Vascular/Lymphatic: Aortic atherosclerosis with mild tortuosity. No aortic  aneurysm. The portal vein is patent. There is bi-iliac tortuosity. No bulky enlarged abdominopelvic lymph nodes. Reproductive: Globular enlarged uterus with multiple presumed fibroids, the largest appears projecting posteriorly and measures 5.6 cm. Right ovary tentatively visualized and normal. The left ovary is not definitively seen. Other: No free air or free fluid. Musculoskeletal: Multilevel degenerative change in the spine. Vacuum phenomena at L4-L5 and L5-S1. Prominent facet hypertrophy. Modic endplate changes at T11-T12. There are no acute or suspicious osseous abnormalities. IMPRESSION: 1. Cholelithiasis without gallbladder inflammation. No biliary dilatation. 2. Coarse calcification in the pancreatic head consistent with chronic pancreatitis. There are at least 3 cystic lesions in the pancreatic head/uncinate process, largest measuring 2.2 cm. These may represent sequela of prior pancreatitis and pseudocysts, however recommend further evaluation  with pancreatic protocol MRI for further characterization. This should ideally be performed on an elective basis after resolution of acute event when patient is able to tolerate breath hold technique. 3. Indeterminate 17 x 18 mm lesion arising from the upper right kidney has internal heterogeneity and peripheral calcifications. This likely represents a Bosniak 67F renal cyst, but is not definitively characterized on the current exam. Recommend MRI characterization of this lesion. There are additional simple cysts as well as parapelvic cysts in both kidneys. 4. Globular enlarged uterus with multiple presumed fibroids, the largest measuring 5.6 cm. 5. Small hiatal hernia with mild distal esophageal wall thickening, can be seen with reflux or esophagitis. 6. Colonic diverticulosis without acute inflammation. Aortic Atherosclerosis (ICD10-I70.0). Electronically Signed   By: Narda Rutherford M.D.   On: 04/20/2020 23:14   MR 3D Recon At Scanner  Result Date:  04/22/2020 CLINICAL DATA:  Pancreatitis suspected, characterize pancreatic lesions, characterize incidental right renal lesion EXAM: MRI ABDOMEN WITHOUT AND WITH CONTRAST (INCLUDING MRCP) TECHNIQUE: Multiplanar multisequence MR imaging of the abdomen was performed both before and after the administration of intravenous contrast. Heavily T2-weighted images of the biliary and pancreatic ducts were obtained, and three-dimensional MRCP images were rendered by post processing. CONTRAST:  7mL GADAVIST GADOBUTROL 1 MMOL/ML IV SOLN COMPARISON:  CT abdomen pelvis, 04/20/2020 FINDINGS: Lower chest: Small right pleural effusion associated atelectasis or consolidation. Trace left pleural effusion. Hepatobiliary: No mass or other parenchymal abnormality identified. Gallstones in the gallbladder. No gallbladder wall thickening. No biliary ductal dilatation. Pancreas: There is a multicystic fluid signal lesion in the anterior pancreatic head measuring 4.1 x 2.6 x 3.0 cm (series 5, image 26, series 4, image 12). There are multiple associated small calcifications. This is clearly distinct from the pancreatic duct and there is no pancreatic ductal dilatation. No solid contrast enhancing component. Spleen:  Within normal limits in size and appearance. Adrenals/Urinary Tract: No masses identified. Numerous bilateral renal cysts, predominantly parapelvic although also with several cortical cysts. An exophytic cyst of the superior pole of the right kidney is partially calcified and demonstrates mixed intrinsic signal, but demonstrates no contrast enhancement. Remaining cysts are simple and fluid signal without enhancement. No evidence of hydronephrosis. Stomach/Bowel: There is gastric mucosal thickening of the antrum and pylorus and a possible small ulceration of the anterior gastric antrum (series 5, image 22). Vascular/Lymphatic: No pathologically enlarged lymph nodes identified. No abdominal aortic aneurysm demonstrated. Other:  There  is new moderate volume ascites and pneumoperitoneum. Musculoskeletal: No suspicious bone lesions identified. IMPRESSION: 1. There is new moderate volume ascites throughout the abdomen and pneumoperitoneum, concerning for perforated viscus organ. There is gastric mucosal thickening of the antrum and pylorus and a possible small ulceration of the anterior pylorus, suggesting a perforated gastric ulceration although nidus of perforation is not definitively localized. 2. There is a multicystic fluid signal lesion in the anterior pancreatic head measuring 4.1 cm. This is clearly distinct from the pancreatic duct and there are multiple associated small calcifications. Favor pancreatic pseudocyst although mucinous cystic neoplasm is a substantial differential consideration given appearance. Given lesion size recommend consideration of EUS and FNA for diagnosis. 3. No pancreatic ductal dilatation. 4. Numerous bilateral renal cysts, predominantly parapelvic although also with several cortical cysts. An exophytic cyst of the superior pole of the right kidney is partially calcified and demonstrates mixed intrinsic signal, but demonstrates no contrast enhancement. This is consistent with a benign proteinaceous or hemorrhagic cyst. 5. Cholelithiasis. No evidence of acute cholecystitis. No biliary ductal  dilatation. These results were called by telephone at the time of interpretation on 04/22/2020 at 10:17 am to Dr. Sherryll Burger, who verbally acknowledged these results. Electronically Signed   By: Lauralyn Primes M.D.   On: 04/22/2020 10:31   DG Chest Port 1 View  Result Date: 04/20/2020 CLINICAL DATA:  Altered mental status. EXAM: PORTABLE CHEST 1 VIEW COMPARISON:  September 09, 2014 FINDINGS: Mild, diffuse chronic appearing increased lung markings are seen without evidence of acute infiltrate, pleural effusion or pneumothorax. The cardiac silhouette is mildly enlarged. The visualized skeletal structures are unremarkable. IMPRESSION:  Chronic appearing increased lung markings without evidence of acute or active cardiopulmonary disease. Electronically Signed   By: Aram Candela M.D.   On: 04/20/2020 21:05   MR ABDOMEN MRCP W WO CONTAST  Result Date: 04/22/2020 CLINICAL DATA:  Pancreatitis suspected, characterize pancreatic lesions, characterize incidental right renal lesion EXAM: MRI ABDOMEN WITHOUT AND WITH CONTRAST (INCLUDING MRCP) TECHNIQUE: Multiplanar multisequence MR imaging of the abdomen was performed both before and after the administration of intravenous contrast. Heavily T2-weighted images of the biliary and pancreatic ducts were obtained, and three-dimensional MRCP images were rendered by post processing. CONTRAST:  7mL GADAVIST GADOBUTROL 1 MMOL/ML IV SOLN COMPARISON:  CT abdomen pelvis, 04/20/2020 FINDINGS: Lower chest: Small right pleural effusion associated atelectasis or consolidation. Trace left pleural effusion. Hepatobiliary: No mass or other parenchymal abnormality identified. Gallstones in the gallbladder. No gallbladder wall thickening. No biliary ductal dilatation. Pancreas: There is a multicystic fluid signal lesion in the anterior pancreatic head measuring 4.1 x 2.6 x 3.0 cm (series 5, image 26, series 4, image 12). There are multiple associated small calcifications. This is clearly distinct from the pancreatic duct and there is no pancreatic ductal dilatation. No solid contrast enhancing component. Spleen:  Within normal limits in size and appearance. Adrenals/Urinary Tract: No masses identified. Numerous bilateral renal cysts, predominantly parapelvic although also with several cortical cysts. An exophytic cyst of the superior pole of the right kidney is partially calcified and demonstrates mixed intrinsic signal, but demonstrates no contrast enhancement. Remaining cysts are simple and fluid signal without enhancement. No evidence of hydronephrosis. Stomach/Bowel: There is gastric mucosal thickening of the  antrum and pylorus and a possible small ulceration of the anterior gastric antrum (series 5, image 22). Vascular/Lymphatic: No pathologically enlarged lymph nodes identified. No abdominal aortic aneurysm demonstrated. Other:  There is new moderate volume ascites and pneumoperitoneum. Musculoskeletal: No suspicious bone lesions identified. IMPRESSION: 1. There is new moderate volume ascites throughout the abdomen and pneumoperitoneum, concerning for perforated viscus organ. There is gastric mucosal thickening of the antrum and pylorus and a possible small ulceration of the anterior pylorus, suggesting a perforated gastric ulceration although nidus of perforation is not definitively localized. 2. There is a multicystic fluid signal lesion in the anterior pancreatic head measuring 4.1 cm. This is clearly distinct from the pancreatic duct and there are multiple associated small calcifications. Favor pancreatic pseudocyst although mucinous cystic neoplasm is a substantial differential consideration given appearance. Given lesion size recommend consideration of EUS and FNA for diagnosis. 3. No pancreatic ductal dilatation. 4. Numerous bilateral renal cysts, predominantly parapelvic although also with several cortical cysts. An exophytic cyst of the superior pole of the right kidney is partially calcified and demonstrates mixed intrinsic signal, but demonstrates no contrast enhancement. This is consistent with a benign proteinaceous or hemorrhagic cyst. 5. Cholelithiasis. No evidence of acute cholecystitis. No biliary ductal dilatation. These results were called by telephone at the time of interpretation  on 04/22/2020 at 10:17 am to Dr. Sherryll Burger, who verbally acknowledged these results. Electronically Signed   By: Lauralyn Primes M.D.   On: 04/22/2020 10:31        Scheduled Meds: . Chlorhexidine Gluconate Cloth  6 each Topical Daily  . citalopram  20 mg Oral Daily  . donepezil  5 mg Oral Daily  . gabapentin  300 mg Oral  BID  . heparin  5,000 Units Subcutaneous Q8H  . lisinopril  10 mg Oral Daily   And  . hydrochlorothiazide  12.5 mg Oral Daily  . ondansetron (ZOFRAN) IV  4 mg Intravenous Q6H  . sodium chloride flush  3 mL Intravenous Q12H  . traZODone  100 mg Oral QHS   Continuous Infusions: . sodium chloride 50 mL/hr at 04/22/20 0052  . sodium chloride    . norepinephrine (LEVOPHED) Adult infusion 3 mcg/min (04/22/20 1137)  . piperacillin-tazobactam (ZOSYN)  IV 12.5 mL/hr at 04/22/20 1137     LOS: 0 days    Time spent: 35 minutes    Baron Parmelee Hoover Brunette, DO [facility]  If 7PM-7AM, please contact night-coverage [email or web address] 04/22/2020, 12:09 PM

## 2020-04-22 NOTE — Op Note (Signed)
Patient:  Maria Murphy  DOB:  08/14/1956  MRN:  [id number]   Preop Diagnosis: Pneumoperitoneum, peritonitis  Postop Diagnosis: Same, perforated prepyloric gastric ulcer  Procedure: Exploratory laparotomy, gastrorrhaphy Phillip Heal plication), central line placement  Surgeon: Aviva Signs, MD  Assistant: Curlene Labrum, MD  Anes: General endotracheal  Indications: Patient is a 64 year old black female who was admitted yesterday evening for work-up of cholelithiasis who on MRCP this morning was noted to have pneumoperitoneum and free fluid in the abdomen.  The patient now comes emergently to the operating room for exploratory laparotomy.  The risks and benefits of the procedure including bleeding, infection, cardiopulmonary difficulties, and the possibility of remaining intubated after the surgery were fully explained to the patient, who gave informed consent.  Procedure note: After induction of general endotracheal anesthesia, the left upper chest was prepped and draped using usual sterile technique with ChloraPrep.  Surgical site confirmation was performed.  I attempted to place a left subclavian triple-lumen catheter, but I could not thread the guidewire.  I thus went to the right femoral area and prepped it with ChloraPrep.  A needle was advanced into the right femoral vein using the Seldinger technique without difficulty.  The guidewire was then advanced.  Introducer was placed over the guidewire.  The triple-lumen catheter was then inserted and the guidewire removed.  Good backflow of venous blood was noted on aspiration of all 3 ports.  All 3 ports were flushed with saline.  There was secured in place at the skin level using a 3-0 silk suture.  Betadine ointment and dry sterile dressing were applied.  Next, we proceeded with the exploratory laparotomy.  Surgical site confirmation was performed.  An upper midline incision was made.  The peritoneal cavity was entered into without difficulty.   Immediately upon entering the abdomen, bilious fluid was found.  Aerobic cultures were taken and sent to microbiology.  I did evacuate the bilious fluid and a three quarters of a centimeter perforation was noted in the prepyloric nature along the lesser curve of the stomach and the antrum.  There was no evidence of malignancy.  We elected to proceed with a Graham plication.  Several 3-0 silk sutures were laid across the perforation.  The piece of omentum was freed away from the greater curvature of the stomach using the LigaSure and then secured to the anterior aspect of the perforation using a 3-0 silk sutures.  The previously placed that silk sutures were overlie the omentum to secured in place.  #10 flat Jackson-Pratt drain was then placed into this region and brought through separate stab wound inferior to the right of the midline.  It was secured in place using a 3-0 nylon suture.  The abdominal cavity was again copiously irrigated with normal saline until the effluent was clear.  The fascia was then reapproximated using an 0 PDS running suture.  The subcutaneous layer was irrigated with normal saline.  Exparel was instilled into the surrounding wound.  The skin was closed using staples.  Betadine ointment and dry sterile dressings were applied.  All tape and needle counts were correct at the end of the procedure.  The patient was transferred to the ICU intubated in guarded but stable condition, with the intent of extubating once the muscle relaxant wore off.  Complications: None  EBL: 25 cc  Specimen: Intra-abdominal aerobic cultures

## 2020-04-22 NOTE — Consult Note (Signed)
Reason for Consult: Pneumoperitoneum, hypotension Referring Physician: Dr. Carolann Littler Maria Murphy is an 64 y.o. female.  HPI: Patient is a 64 year old black female who was admitted yesterday evening for upper abdominal pain that was felt to be secondary to chronic cholecystitis and cholelithiasis.  She was undergoing an MRCP today for her elevated bilirubin and she was found to have new free fluid in the abdomen and pneumoperitoneum.  A rapid response team was called as soon as she got back to her bed due to hypotension and she was transferred to the intensive care unit.  General surgery was asked to see her emergently for the pneumoperitoneum.  Patient states that she takes Naprosyn approximately twice a day to help out with arthritis.  She has never been told she has an ulcer.  She drinks wine on occasion.  She states her abdomen hurts when she moves.  Past Medical History:  Diagnosis Date  . Essential hypertension, benign 01/24/2015  . Foot ulcer ([place])   . Hypertension   . Memory loss   . Skin cancer   . Vision abnormalities     Past Surgical History:  Procedure Laterality Date  . TONSILLECTOMY      Family History  Problem Relation Age of Onset  . High Cholesterol Mother   . Hypertension Mother   . Arthritis/Rheumatoid Mother   . Alcohol abuse Father   . Colon cancer Neg Hx   . Colon polyps Neg Hx     Social History:  reports that she has never smoked. She has never used smokeless tobacco. She reports that she does not drink alcohol and does not use drugs.  Allergies:  Allergies  Allergen Reactions  . Wheat Extract Swelling    Medications: I have reviewed the patient's current medications.  Results for orders placed or performed during the hospital encounter of 04/21/20 (from the past 48 hour(s))  CBC with Differential     Status: Abnormal   Collection Time: 04/21/20  6:35 PM  Result Value Ref Range   WBC 9.3 4.0 - 10.5 K/uL   RBC 4.47 3.87 - 5.11 MIL/uL   Hemoglobin  13.6 12.0 - 15.0 g/dL   HCT 40.1 36 - 46 %   MCV 89.7 80.0 - 100.0 fL   MCH 30.4 26.0 - 34.0 pg   MCHC 33.9 30.0 - 36.0 g/dL   RDW 14.1 11.5 - 15.5 %   Platelets 175 150 - 400 K/uL   nRBC 0.0 0.0 - 0.2 %   Neutrophils Relative % 85 %   Neutro Abs 7.8 (H) 1.7 - 7.7 K/uL   Lymphocytes Relative 9 %   Lymphs Abs 0.9 0.7 - 4.0 K/uL   Monocytes Relative 6 %   Monocytes Absolute 0.6 0 - 1 K/uL   Eosinophils Relative 0 %   Eosinophils Absolute 0.0 0 - 0 K/uL   Basophils Relative 0 %   Basophils Absolute 0.0 0 - 0 K/uL   Immature Granulocytes 0 %   Abs Immature Granulocytes 0.03 0.00 - 0.07 K/uL    Comment: Performed at [facility], [address], [place], [place] [address]  Comprehensive metabolic panel     Status: Abnormal   Collection Time: 04/21/20  6:35 PM  Result Value Ref Range   Sodium 138 135 - 145 mmol/L   Potassium 3.7 3.5 - 5.1 mmol/L   Chloride 105 98 - 111 mmol/L   CO2 21 (L) 22 - 32 mmol/L   Glucose, Bld 94 70 - 99 mg/dL  Comment: Glucose reference range applies only to samples taken after fasting for at least 8 hours.   BUN 20 8 - 23 mg/dL   Creatinine, Ser 1.03 (H) 0.44 - 1.00 mg/dL   Calcium 8.8 (L) 8.9 - 10.3 mg/dL   Total Protein 7.8 6.5 - 8.1 g/dL   Albumin 4.5 3.5 - 5.0 g/dL   AST 18 15 - 41 U/L   ALT 9 0 - 44 U/L   Alkaline Phosphatase 37 (L) 38 - 126 U/L   Total Bilirubin 4.3 (H) 0.3 - 1.2 mg/dL   GFR calc non Af Amer 58 (L) >60 mL/min   GFR calc Af Amer >60 >60 mL/min   Anion gap 12 5 - 15    Comment: Performed at [facility], [address] [place], [place] [address]  Lipase, blood     Status: None   Collection Time: 04/21/20  6:35 PM  Result Value Ref Range   Lipase 33 11 - 51 U/L    Comment: Performed at [facility], [address], [place], [place] [address]  Ethanol     Status: None   Collection Time: 04/21/20  6:35 PM  Result Value Ref Range   Alcohol, Ethyl (B) <10 <10 mg/dL    Comment: (NOTE) Lowest detectable limit for serum alcohol is  10 mg/dL.  For medical purposes only. Performed at [facility], [address], [place], [place] [address]   HIV Antibody (routine testing w rflx)     Status: None   Collection Time: 04/21/20  6:35 PM  Result Value Ref Range   HIV Screen 4th Generation wRfx Non Reactive Non Reactive    Comment: Performed at [facility], [place] [address] [place], [place] [address]  SARS Coronavirus 2 by RT PCR (hospital order, performed in [facility] hospital lab) Nasopharyngeal Nasopharyngeal Swab     Status: None   Collection Time: 04/22/20 12:16 AM   Specimen: Nasopharyngeal Swab  Result Value Ref Range   SARS Coronavirus 2 NEGATIVE NEGATIVE    Comment: (NOTE) SARS-CoV-2 target nucleic acids are NOT DETECTED.  The SARS-CoV-2 RNA is generally detectable in upper and lower respiratory specimens during the acute phase of infection. The lowest concentration of SARS-CoV-2 viral copies this assay can detect is 250 copies / mL. A negative result does not preclude SARS-CoV-2 infection and should not be used as the sole basis for treatment or other patient management decisions.  A negative result may occur with improper specimen collection / handling, submission of specimen other than nasopharyngeal swab, presence of viral mutation(s) within the areas targeted by this assay, and inadequate number of viral copies (<250 copies / mL). A negative result must be combined with clinical observations, patient history, and epidemiological information.  Fact Sheet for Patients:   StrictlyIdeas.no  Fact Sheet for Healthcare Providers: BankingDealers.co.za  This test is not yet approved or  cleared by the [place] FDA and has been authorized for detection and/or diagnosis of SARS-CoV-2 by FDA under an Emergency Use Authorization (EUA).  This EUA will remain in effect (meaning this test can be used) for the duration of the COVID-19 declaration under  Section 564(b)(1) of the Act, 21 U.S.C. section 360bbb-3(b)(1), unless the authorization is terminated or revoked sooner.  Performed at [facility], [address] [place], [place] [address]   Comprehensive metabolic panel     Status: Abnormal   Collection Time: 04/22/20  6:29 AM  Result Value Ref Range   Sodium 139 135 -  145 mmol/L   Potassium 3.8 3.5 - 5.1 mmol/L   Chloride 108 98 - 111 mmol/L   CO2 20 (L) 22 - 32 mmol/L   Glucose, Bld 130 (H) 70 - 99 mg/dL    Comment: Glucose reference range applies only to samples taken after fasting for at least 8 hours.   BUN 24 (H) 8 - 23 mg/dL   Creatinine, Ser 0.98 0.44 - 1.00 mg/dL   Calcium 8.3 (L) 8.9 - 10.3 mg/dL   Total Protein 6.4 (L) 6.5 - 8.1 g/dL   Albumin 3.8 3.5 - 5.0 g/dL   AST 21 15 - 41 U/L   ALT 7 0 - 44 U/L   Alkaline Phosphatase 27 (L) 38 - 126 U/L   Total Bilirubin 1.3 (H) 0.3 - 1.2 mg/dL   GFR calc non Af Amer >60 >60 mL/min   GFR calc Af Amer >60 >60 mL/min   Anion gap 11 5 - 15    Comment: Performed at [facility], [address], [place], [place] [address]    CT Head Wo Contrast  Result Date: 04/20/2020 CLINICAL DATA:  Mental status change EXAM: CT HEAD WITHOUT CONTRAST TECHNIQUE: Contiguous axial images were obtained from the base of the skull through the vertex without intravenous contrast. COMPARISON:  CT brain 03/19/2020 FINDINGS: Brain: No acute territorial infarction, hemorrhage, or intracranial mass. The ventricles are nonenlarged. Vascular: No hyperdense vessels.  No unexpected calcification. Skull: Normal. Negative for fracture or focal lesion. Sinuses/Orbits: No acute finding. Other: None IMPRESSION: Negative non contrasted CT appearance of the brain. Electronically Signed   By: Donavan Foil M.D.   On: 04/20/2020 21:14   CT ABDOMEN PELVIS W CONTRAST  Result Date: 04/20/2020 CLINICAL DATA:  Acute abdominal pain. Elevated bilirubin. Altered mental status. EXAM: CT ABDOMEN AND PELVIS WITH CONTRAST TECHNIQUE:  Multidetector CT imaging of the abdomen and pelvis was performed using the standard protocol following bolus administration of intravenous contrast. CONTRAST:  71mL OMNIPAQUE IOHEXOL 300 MG/ML  SOLN COMPARISON:  No prior abdominal imaging. FINDINGS: Lower chest: Mild hypoventilatory changes in the lung bases. No pleural fluid or confluent airspace disease. Hepatobiliary: 7 mm hypodensity in the right dome of the liver, series 2, image 12, too small to accurately characterize. Multiple cholesterol gallstones in the gallbladder. No pericholecystic inflammation or evidence of wall thickening. No common bile duct dilatation. Pancreas: Coarse calcification in the pancreatic head. There are at least 3 cystic lesions in the pancreatic head/uncinate process, largest measuring 2.2 cm, series 2, image 29. There is an adjacent 1.3 cm cystic structure and a 1 cm cyst more inferiorly. No definite peripancreatic fat stranding or inflammation. There is no pancreatic ductal dilatation. Spleen: Normal in size without focal abnormality. Adrenals/Urinary Tract: No adrenal nodule. There is a punctate calcification involving the medial limb of the left adrenal gland. Indeterminate lesion arising from the upper right kidney measures 17 x 18 mm has internal heterogeneity and peripheral calcifications. This is adjacent to a small cortical cyst. There are scattered additional cysts within the right kidney as well as parapelvic cysts. Cortical as well as parapelvic cysts in the left kidney. No hydronephrosis. No perinephric edema. Urinary bladder is unremarkable. Stomach/Bowel: Small hiatal hernia with mild distal esophageal wall thickening. Stomach is unremarkable. Normal positioning of the duodenum and ligament of Treitz. There is no small bowel obstruction or inflammatory change. Cecum is high-riding in the right mid abdomen. Normal air-filled appendix. Transverse colonic tortuosity. Colonic diverticulosis involving the distal descending  and sigmoid  colon. No acute diverticulitis. Vascular/Lymphatic: Aortic atherosclerosis with mild tortuosity. No aortic aneurysm. The portal vein is patent. There is bi-iliac tortuosity. No bulky enlarged abdominopelvic lymph nodes. Reproductive: Globular enlarged uterus with multiple presumed fibroids, the largest appears projecting posteriorly and measures 5.6 cm. Right ovary tentatively visualized and normal. The left ovary is not definitively seen. Other: No free air or free fluid. Musculoskeletal: Multilevel degenerative change in the spine. Vacuum phenomena at L4-L5 and L5-S1. Prominent facet hypertrophy. Modic endplate changes at W29-F62. There are no acute or suspicious osseous abnormalities. IMPRESSION: 1. Cholelithiasis without gallbladder inflammation. No biliary dilatation. 2. Coarse calcification in the pancreatic head consistent with chronic pancreatitis. There are at least 3 cystic lesions in the pancreatic head/uncinate process, largest measuring 2.2 cm. These may represent sequela of prior pancreatitis and pseudocysts, however recommend further evaluation with pancreatic protocol MRI for further characterization. This should ideally be performed on an elective basis after resolution of acute event when patient is able to tolerate breath hold technique. 3. Indeterminate 17 x 18 mm lesion arising from the upper right kidney has internal heterogeneity and peripheral calcifications. This likely represents a Bosniak 65F renal cyst, but is not definitively characterized on the current exam. Recommend MRI characterization of this lesion. There are additional simple cysts as well as parapelvic cysts in both kidneys. 4. Globular enlarged uterus with multiple presumed fibroids, the largest measuring 5.6 cm. 5. Small hiatal hernia with mild distal esophageal wall thickening, can be seen with reflux or esophagitis. 6. Colonic diverticulosis without acute inflammation. Aortic Atherosclerosis (ICD10-I70.0).  Electronically Signed   By: Keith Rake M.D.   On: 04/20/2020 23:14   MR 3D Recon At Scanner  Result Date: 04/22/2020 CLINICAL DATA:  Pancreatitis suspected, characterize pancreatic lesions, characterize incidental right renal lesion EXAM: MRI ABDOMEN WITHOUT AND WITH CONTRAST (INCLUDING MRCP) TECHNIQUE: Multiplanar multisequence MR imaging of the abdomen was performed both before and after the administration of intravenous contrast. Heavily T2-weighted images of the biliary and pancreatic ducts were obtained, and three-dimensional MRCP images were rendered by post processing. CONTRAST:  1mL GADAVIST GADOBUTROL 1 MMOL/ML IV SOLN COMPARISON:  CT abdomen pelvis, 04/20/2020 FINDINGS: Lower chest: Small right pleural effusion associated atelectasis or consolidation. Trace left pleural effusion. Hepatobiliary: No mass or other parenchymal abnormality identified. Gallstones in the gallbladder. No gallbladder wall thickening. No biliary ductal dilatation. Pancreas: There is a multicystic fluid signal lesion in the anterior pancreatic head measuring 4.1 x 2.6 x 3.0 cm (series 5, image 26, series 4, image 12). There are multiple associated small calcifications. This is clearly distinct from the pancreatic duct and there is no pancreatic ductal dilatation. No solid contrast enhancing component. Spleen:  Within normal limits in size and appearance. Adrenals/Urinary Tract: No masses identified. Numerous bilateral renal cysts, predominantly parapelvic although also with several cortical cysts. An exophytic cyst of the superior pole of the right kidney is partially calcified and demonstrates mixed intrinsic signal, but demonstrates no contrast enhancement. Remaining cysts are simple and fluid signal without enhancement. No evidence of hydronephrosis. Stomach/Bowel: There is gastric mucosal thickening of the antrum and pylorus and a possible small ulceration of the anterior gastric antrum (series 5, image 22).  Vascular/Lymphatic: No pathologically enlarged lymph nodes identified. No abdominal aortic aneurysm demonstrated. Other:  There is new moderate volume ascites and pneumoperitoneum. Musculoskeletal: No suspicious bone lesions identified. IMPRESSION: 1. There is new moderate volume ascites throughout the abdomen and pneumoperitoneum, concerning for perforated viscus organ. There is gastric mucosal thickening of the  antrum and pylorus and a possible small ulceration of the anterior pylorus, suggesting a perforated gastric ulceration although nidus of perforation is not definitively localized. 2. There is a multicystic fluid signal lesion in the anterior pancreatic head measuring 4.1 cm. This is clearly distinct from the pancreatic duct and there are multiple associated small calcifications. Favor pancreatic pseudocyst although mucinous cystic neoplasm is a substantial differential consideration given appearance. Given lesion size recommend consideration of EUS and FNA for diagnosis. 3. No pancreatic ductal dilatation. 4. Numerous bilateral renal cysts, predominantly parapelvic although also with several cortical cysts. An exophytic cyst of the superior pole of the right kidney is partially calcified and demonstrates mixed intrinsic signal, but demonstrates no contrast enhancement. This is consistent with a benign proteinaceous or hemorrhagic cyst. 5. Cholelithiasis. No evidence of acute cholecystitis. No biliary ductal dilatation. These results were called by telephone at the time of interpretation on 04/22/2020 at 10:17 am to Dr. Manuella Ghazi, who verbally acknowledged these results. Electronically Signed   By: Eddie Candle M.D.   On: 04/22/2020 10:31   DG Chest Port 1 View  Result Date: 04/20/2020 CLINICAL DATA:  Altered mental status. EXAM: PORTABLE CHEST 1 VIEW COMPARISON:  September 09, 2014 FINDINGS: Mild, diffuse chronic appearing increased lung markings are seen without evidence of acute infiltrate, pleural effusion  or pneumothorax. The cardiac silhouette is mildly enlarged. The visualized skeletal structures are unremarkable. IMPRESSION: Chronic appearing increased lung markings without evidence of acute or active cardiopulmonary disease. Electronically Signed   By: Virgina Norfolk M.D.   On: 04/20/2020 21:05   MR ABDOMEN MRCP W WO CONTAST  Result Date: 04/22/2020 CLINICAL DATA:  Pancreatitis suspected, characterize pancreatic lesions, characterize incidental right renal lesion EXAM: MRI ABDOMEN WITHOUT AND WITH CONTRAST (INCLUDING MRCP) TECHNIQUE: Multiplanar multisequence MR imaging of the abdomen was performed both before and after the administration of intravenous contrast. Heavily T2-weighted images of the biliary and pancreatic ducts were obtained, and three-dimensional MRCP images were rendered by post processing. CONTRAST:  76mL GADAVIST GADOBUTROL 1 MMOL/ML IV SOLN COMPARISON:  CT abdomen pelvis, 04/20/2020 FINDINGS: Lower chest: Small right pleural effusion associated atelectasis or consolidation. Trace left pleural effusion. Hepatobiliary: No mass or other parenchymal abnormality identified. Gallstones in the gallbladder. No gallbladder wall thickening. No biliary ductal dilatation. Pancreas: There is a multicystic fluid signal lesion in the anterior pancreatic head measuring 4.1 x 2.6 x 3.0 cm (series 5, image 26, series 4, image 12). There are multiple associated small calcifications. This is clearly distinct from the pancreatic duct and there is no pancreatic ductal dilatation. No solid contrast enhancing component. Spleen:  Within normal limits in size and appearance. Adrenals/Urinary Tract: No masses identified. Numerous bilateral renal cysts, predominantly parapelvic although also with several cortical cysts. An exophytic cyst of the superior pole of the right kidney is partially calcified and demonstrates mixed intrinsic signal, but demonstrates no contrast enhancement. Remaining cysts are simple and  fluid signal without enhancement. No evidence of hydronephrosis. Stomach/Bowel: There is gastric mucosal thickening of the antrum and pylorus and a possible small ulceration of the anterior gastric antrum (series 5, image 22). Vascular/Lymphatic: No pathologically enlarged lymph nodes identified. No abdominal aortic aneurysm demonstrated. Other:  There is new moderate volume ascites and pneumoperitoneum. Musculoskeletal: No suspicious bone lesions identified. IMPRESSION: 1. There is new moderate volume ascites throughout the abdomen and pneumoperitoneum, concerning for perforated viscus organ. There is gastric mucosal thickening of the antrum and pylorus and a possible small ulceration of the anterior pylorus,  suggesting a perforated gastric ulceration although nidus of perforation is not definitively localized. 2. There is a multicystic fluid signal lesion in the anterior pancreatic head measuring 4.1 cm. This is clearly distinct from the pancreatic duct and there are multiple associated small calcifications. Favor pancreatic pseudocyst although mucinous cystic neoplasm is a substantial differential consideration given appearance. Given lesion size recommend consideration of EUS and FNA for diagnosis. 3. No pancreatic ductal dilatation. 4. Numerous bilateral renal cysts, predominantly parapelvic although also with several cortical cysts. An exophytic cyst of the superior pole of the right kidney is partially calcified and demonstrates mixed intrinsic signal, but demonstrates no contrast enhancement. This is consistent with a benign proteinaceous or hemorrhagic cyst. 5. Cholelithiasis. No evidence of acute cholecystitis. No biliary ductal dilatation. These results were called by telephone at the time of interpretation on 04/22/2020 at 10:17 am to Dr. Manuella Ghazi, who verbally acknowledged these results. Electronically Signed   By: Eddie Candle M.D.   On: 04/22/2020 10:31    ROS:  Pertinent items are noted in  HPI.  Blood pressure (!) 92/52, pulse 67, temperature 97.6 F (36.4 C), resp. rate (!) 31, height 5\' 4"  (1.626 m), weight 90.5 kg, SpO2 96 %. Physical Exam: Pleasant black female in moderate distress when she moves Abdomen is diffusely tender with palpation.  She does have rigidity.  MRI and CT scan images personally reviewed  Assessment/Plan: Impression: Acute onset pneumoperitoneum, probably from a peptic ulcer perforation Plan: We will proceed with fluid resuscitation.  Agree with Levophed.  She is already on Zosyn.  Either myself or Dr. Constance Haw will proceed with exploratory laparotomy, possible central line placement.  The risks and benefits of the procedure including bleeding, infection, and cardiopulmonary difficulties were fully explained to the patient and her sister, Maria Murphy, who gave informed consent.  Foley and NG tube placement have been ordered.  She is receiving fluid boluses.  Aviva Signs 04/22/2020, 11:59 AM

## 2020-04-22 NOTE — Progress Notes (Signed)
JP drain in place and intact to right lower abd. Small amount of dark red blood in drain. Dressing around is clean/intact

## 2020-04-22 NOTE — Anesthesia Preprocedure Evaluation (Addendum)
Anesthesia Evaluation  Patient identified by MRN, date of birth, ID band Patient awake    Reviewed: Allergy & Precautions, NPO status , Patient's Chart, lab work & pertinent test results  History of Anesthesia Complications Negative for: history of anesthetic complications  Airway Mallampati: II  TM Distance: >3 FB Neck ROM: Full    Dental  (+) Dental Advisory Given, Lower Dentures, Missing   Pulmonary neg pulmonary ROS,           Cardiovascular Exercise Tolerance: Good hypertension, Pt. on medications and Pt. on home beta blockers  Rhythm:Regular Rate:Normal     Neuro/Psych PSYCHIATRIC DISORDERS Depression negative neurological ROS     GI/Hepatic negative GI ROS, Neg liver ROS,   Endo/Other  negative endocrine ROS  Renal/GU negative Renal ROS     Musculoskeletal negative musculoskeletal ROS (+)   Abdominal   Peds  Hematology negative hematology ROS (+)   Anesthesia Other Findings   Reproductive/Obstetrics negative OB ROS                          Anesthesia Physical Anesthesia Plan  ASA: IV and emergent  Anesthesia Plan: General   Post-op Pain Management:    Induction: Intravenous, Rapid sequence and Cricoid pressure planned  PONV Risk Score and Plan: 4 or greater and Ondansetron and Dexamethasone  Airway Management Planned: Oral ETT  Additional Equipment:   Intra-op Plan:   Post-operative Plan: Extubation in OR and Possible Post-op intubation/ventilation  Informed Consent: I have reviewed the patients History and Physical, chart, labs and discussed the procedure including the risks, benefits and alternatives for the proposed anesthesia with the patient or authorized representative who has indicated his/her understanding and acceptance.     Dental advisory given  Plan Discussed with: CRNA and Surgeon  Anesthesia Plan Comments: (Patient is in septic shock. Blood  pressures low, received 2 liters of iv fluid bolus in ICU, will give one more liter of LR bolus before patient goes to OR.)      Anesthesia Quick Evaluation

## 2020-04-22 NOTE — Progress Notes (Signed)
Reacting pt in ICU. Pt arrived [id number]. Primary ICU RN at bedside as well.

## 2020-04-22 NOTE — Anesthesia Procedure Notes (Signed)
Arterial Line Insertion Performed by: Denese Killings, MD, Angela Vazguez, Octavio Graves, CRNA, CRNA  Patient location: OR. Preanesthetic checklist: patient identified, IV checked, site marked, risks and benefits discussed, surgical consent, monitors and equipment checked, pre-op evaluation and timeout performed Lidocaine 1% used for infiltration Right, radial was placed Catheter size: 20 G Hand hygiene performed  and Seldinger technique used Allen's test indicative of satisfactory collateral circulation Attempts: 1 Procedure performed using ultrasound guided technique. Ultrasound Notes:anatomy identified Following insertion, dressing applied. Patient tolerated the procedure well with no immediate complications.

## 2020-04-22 NOTE — H&P (View-Only) (Signed)
Reason for Consult: Pneumoperitoneum, hypotension Referring Physician: [name] is an 64 y.o. female.  HPI: Patient is a 64 year old black female who was admitted yesterday evening for upper abdominal pain that was felt to be secondary to chronic cholecystitis and cholelithiasis.  Maria Murphy was undergoing an MRCP today for her elevated bilirubin and Maria Murphy was found to have new free fluid in the abdomen and pneumoperitoneum.  A rapid response team was called as soon as Maria Murphy got back to her bed due to hypotension and Maria Murphy was transferred to the intensive care unit.  General surgery was asked to see her emergently for the pneumoperitoneum.  Patient states that Maria Murphy takes Naprosyn approximately twice a day to help out with arthritis.  Maria Murphy has never been told Maria Murphy has an ulcer.  Maria Murphy drinks wine on occasion.  Maria Murphy states her abdomen hurts when Maria Murphy moves.  Past Medical History:  Diagnosis Date  . Essential hypertension, benign 01/24/2015  . Foot ulcer ([place])   . Hypertension   . Memory loss   . Skin cancer   . Vision abnormalities     Past Surgical History:  Procedure Laterality Date  . TONSILLECTOMY      Family History  Problem Relation Age of Onset  . High Cholesterol Mother   . Hypertension Mother   . Arthritis/Rheumatoid Mother   . Alcohol abuse Father   . Colon cancer Neg Hx   . Colon polyps Neg Hx     Social History:  reports that Maria Murphy has never smoked. Maria Murphy has never used smokeless tobacco. Maria Murphy reports that Maria Murphy does not drink alcohol and does not use drugs.  Allergies:  Allergies  Allergen Reactions  . Wheat Extract Swelling    Medications: I have reviewed the patient's current medications.  Results for orders placed or performed during the hospital encounter of 04/21/20 (from the past 48 hour(s))  CBC with Differential     Status: Abnormal   Collection Time: 04/21/20  6:35 PM  Result Value Ref Range   WBC 9.3 4.0 - 10.5 K/uL   RBC 4.47 3.87 - 5.11 MIL/uL   Hemoglobin  13.6 12.0 - 15.0 g/dL   HCT 40.1 36 - 46 %   MCV 89.7 80.0 - 100.0 fL   MCH 30.4 26.0 - 34.0 pg   MCHC 33.9 30.0 - 36.0 g/dL   RDW 14.1 11.5 - 15.5 %   Platelets 175 150 - 400 K/uL   nRBC 0.0 0.0 - 0.2 %   Neutrophils Relative % 85 %   Neutro Abs 7.8 (H) 1.7 - 7.7 K/uL   Lymphocytes Relative 9 %   Lymphs Abs 0.9 0.7 - 4.0 K/uL   Monocytes Relative 6 %   Monocytes Absolute 0.6 0 - 1 K/uL   Eosinophils Relative 0 %   Eosinophils Absolute 0.0 0 - 0 K/uL   Basophils Relative 0 %   Basophils Absolute 0.0 0 - 0 K/uL   Immature Granulocytes 0 %   Abs Immature Granulocytes 0.03 0.00 - 0.07 K/uL    Comment: Performed at [facility], [address] [place],  [address]  Comprehensive metabolic panel     Status: Abnormal   Collection Time: 04/21/20  6:35 PM  Result Value Ref Range   Sodium 138 135 - 145 mmol/L   Potassium 3.7 3.5 - 5.1 mmol/L   Chloride 105 98 - 111 mmol/L   CO2 21 (L) 22 - 32 mmol/L   Glucose, Bld 94 70 - 99 mg/dL  Comment: Glucose reference range applies only to samples taken after fasting for at least 8 hours.   BUN 20 8 - 23 mg/dL   Creatinine, Ser 1.03 (H) 0.44 - 1.00 mg/dL   Calcium 8.8 (L) 8.9 - 10.3 mg/dL   Total Protein 7.8 6.5 - 8.1 g/dL   Albumin 4.5 3.5 - 5.0 g/dL   AST 18 15 - 41 U/L   ALT 9 0 - 44 U/L   Alkaline Phosphatase 37 (L) 38 - 126 U/L   Total Bilirubin 4.3 (H) 0.3 - 1.2 mg/dL   GFR calc non Af Amer 58 (L) >60 mL/min   GFR calc Af Amer >60 >60 mL/min   Anion gap 12 5 - 15    Comment: Performed at [facility], [address], [place], [place] [address]  Lipase, blood     Status: None   Collection Time: 04/21/20  6:35 PM  Result Value Ref Range   Lipase 33 11 - 51 U/L    Comment: Performed at [facility], [address], [place], [place] [address]  Ethanol     Status: None   Collection Time: 04/21/20  6:35 PM  Result Value Ref Range   Alcohol, Ethyl (B) <10 <10 mg/dL    Comment: (NOTE) Lowest detectable limit for serum alcohol is  10 mg/dL.  For medical purposes only. Performed at [facility], [address] [place], [place] [address]   HIV Antibody (routine testing w rflx)     Status: None   Collection Time: 04/21/20  6:35 PM  Result Value Ref Range   HIV Screen 4th Generation wRfx Non Reactive Non Reactive    Comment: Performed at [facility], [place] [address], [place], [place] [address]  SARS Coronavirus 2 by RT PCR (hospital order, performed in [facility] hospital lab) Nasopharyngeal Nasopharyngeal Swab     Status: None   Collection Time: 04/22/20 12:16 AM   Specimen: Nasopharyngeal Swab  Result Value Ref Range   SARS Coronavirus 2 NEGATIVE NEGATIVE    Comment: (NOTE) SARS-CoV-2 target nucleic acids are NOT DETECTED.  The SARS-CoV-2 RNA is generally detectable in upper and lower respiratory specimens during the acute phase of infection. The lowest concentration of SARS-CoV-2 viral copies this assay can detect is 250 copies / mL. A negative result does not preclude SARS-CoV-2 infection and should not be used as the sole basis for treatment or other patient management decisions.  A negative result may occur with improper specimen collection / handling, submission of specimen other than nasopharyngeal swab, presence of viral mutation(s) within the areas targeted by this assay, and inadequate number of viral copies (<250 copies / mL). A negative result must be combined with clinical observations, patient history, and epidemiological information.  Fact Sheet for Patients:   StrictlyIdeas.no  Fact Sheet for Healthcare Providers: BankingDealers.co.za  This test is not yet approved or  cleared by the [place] FDA and has been authorized for detection and/or diagnosis of SARS-CoV-2 by FDA under an Emergency Use Authorization (EUA).  This EUA will remain in effect (meaning this test can be used) for the duration of the COVID-19 declaration under  Section 564(b)(1) of the Act, 21 U.S.C. section 360bbb-3(b)(1), unless the authorization is terminated or revoked sooner.  Performed at [facility], [address] [place], [place] [address]   Comprehensive metabolic panel     Status: Abnormal   Collection Time: 04/22/20  6:29 AM  Result Value Ref Range   Sodium 139 135 -  145 mmol/L   Potassium 3.8 3.5 - 5.1 mmol/L   Chloride 108 98 - 111 mmol/L   CO2 20 (L) 22 - 32 mmol/L   Glucose, Bld 130 (H) 70 - 99 mg/dL    Comment: Glucose reference range applies only to samples taken after fasting for at least 8 hours.   BUN 24 (H) 8 - 23 mg/dL   Creatinine, Ser 0.98 0.44 - 1.00 mg/dL   Calcium 8.3 (L) 8.9 - 10.3 mg/dL   Total Protein 6.4 (L) 6.5 - 8.1 g/dL   Albumin 3.8 3.5 - 5.0 g/dL   AST 21 15 - 41 U/L   ALT 7 0 - 44 U/L   Alkaline Phosphatase 27 (L) 38 - 126 U/L   Total Bilirubin 1.3 (H) 0.3 - 1.2 mg/dL   GFR calc non Af Amer >60 >60 mL/min   GFR calc Af Amer >60 >60 mL/min   Anion gap 11 5 - 15    Comment: Performed at [facility], [address], [place], [place] [address]    CT Head Wo Contrast  Result Date: 04/20/2020 CLINICAL DATA:  Mental status change EXAM: CT HEAD WITHOUT CONTRAST TECHNIQUE: Contiguous axial images were obtained from the base of the skull through the vertex without intravenous contrast. COMPARISON:  CT brain 03/19/2020 FINDINGS: Brain: No acute territorial infarction, hemorrhage, or intracranial mass. The ventricles are nonenlarged. Vascular: No hyperdense vessels.  No unexpected calcification. Skull: Normal. Negative for fracture or focal lesion. Sinuses/Orbits: No acute finding. Other: None IMPRESSION: Negative non contrasted CT appearance of the brain. Electronically Signed   By: Donavan Foil M.D.   On: 04/20/2020 21:14   CT ABDOMEN PELVIS W CONTRAST  Result Date: 04/20/2020 CLINICAL DATA:  Acute abdominal pain. Elevated bilirubin. Altered mental status. EXAM: CT ABDOMEN AND PELVIS WITH CONTRAST TECHNIQUE:  Multidetector CT imaging of the abdomen and pelvis was performed using the standard protocol following bolus administration of intravenous contrast. CONTRAST:  65mL OMNIPAQUE IOHEXOL 300 MG/ML  SOLN COMPARISON:  No prior abdominal imaging. FINDINGS: Lower chest: Mild hypoventilatory changes in the lung bases. No pleural fluid or confluent airspace disease. Hepatobiliary: 7 mm hypodensity in the right dome of the liver, series 2, image 12, too small to accurately characterize. Multiple cholesterol gallstones in the gallbladder. No pericholecystic inflammation or evidence of wall thickening. No common bile duct dilatation. Pancreas: Coarse calcification in the pancreatic head. There are at least 3 cystic lesions in the pancreatic head/uncinate process, largest measuring 2.2 cm, series 2, image 29. There is an adjacent 1.3 cm cystic structure and a 1 cm cyst more inferiorly. No definite peripancreatic fat stranding or inflammation. There is no pancreatic ductal dilatation. Spleen: Normal in size without focal abnormality. Adrenals/Urinary Tract: No adrenal nodule. There is a punctate calcification involving the medial limb of the left adrenal gland. Indeterminate lesion arising from the upper right kidney measures 17 x 18 mm has internal heterogeneity and peripheral calcifications. This is adjacent to a small cortical cyst. There are scattered additional cysts within the right kidney as well as parapelvic cysts. Cortical as well as parapelvic cysts in the left kidney. No hydronephrosis. No perinephric edema. Urinary bladder is unremarkable. Stomach/Bowel: Small hiatal hernia with mild distal esophageal wall thickening. Stomach is unremarkable. Normal positioning of the duodenum and ligament of Treitz. There is no small bowel obstruction or inflammatory change. Cecum is high-riding in the right mid abdomen. Normal air-filled appendix. Transverse colonic tortuosity. Colonic diverticulosis involving the distal descending  and sigmoid  colon. No acute diverticulitis. Vascular/Lymphatic: Aortic atherosclerosis with mild tortuosity. No aortic aneurysm. The portal vein is patent. There is bi-iliac tortuosity. No bulky enlarged abdominopelvic lymph nodes. Reproductive: Globular enlarged uterus with multiple presumed fibroids, the largest appears projecting posteriorly and measures 5.6 cm. Right ovary tentatively visualized and normal. The left ovary is not definitively seen. Other: No free air or free fluid. Musculoskeletal: Multilevel degenerative change in the spine. Vacuum phenomena at L4-L5 and L5-S1. Prominent facet hypertrophy. Modic endplate changes at I43-P29. There are no acute or suspicious osseous abnormalities. IMPRESSION: 1. Cholelithiasis without gallbladder inflammation. No biliary dilatation. 2. Coarse calcification in the pancreatic head consistent with chronic pancreatitis. There are at least 3 cystic lesions in the pancreatic head/uncinate process, largest measuring 2.2 cm. These may represent sequela of prior pancreatitis and pseudocysts, however recommend further evaluation with pancreatic protocol MRI for further characterization. This should ideally be performed on an elective basis after resolution of acute event when patient is able to tolerate breath hold technique. 3. Indeterminate 17 x 18 mm lesion arising from the upper right kidney has internal heterogeneity and peripheral calcifications. This likely represents a Bosniak 79F renal cyst, but is not definitively characterized on the current exam. Recommend MRI characterization of this lesion. There are additional simple cysts as well as parapelvic cysts in both kidneys. 4. Globular enlarged uterus with multiple presumed fibroids, the largest measuring 5.6 cm. 5. Small hiatal hernia with mild distal esophageal wall thickening, can be seen with reflux or esophagitis. 6. Colonic diverticulosis without acute inflammation. Aortic Atherosclerosis (ICD10-I70.0).  Electronically Signed   By: Keith Rake M.D.   On: 04/20/2020 23:14   MR 3D Recon At Scanner  Result Date: 04/22/2020 CLINICAL DATA:  Pancreatitis suspected, characterize pancreatic lesions, characterize incidental right renal lesion EXAM: MRI ABDOMEN WITHOUT AND WITH CONTRAST (INCLUDING MRCP) TECHNIQUE: Multiplanar multisequence MR imaging of the abdomen was performed both before and after the administration of intravenous contrast. Heavily T2-weighted images of the biliary and pancreatic ducts were obtained, and three-dimensional MRCP images were rendered by post processing. CONTRAST:  59mL GADAVIST GADOBUTROL 1 MMOL/ML IV SOLN COMPARISON:  CT abdomen pelvis, 04/20/2020 FINDINGS: Lower chest: Small right pleural effusion associated atelectasis or consolidation. Trace left pleural effusion. Hepatobiliary: No mass or other parenchymal abnormality identified. Gallstones in the gallbladder. No gallbladder wall thickening. No biliary ductal dilatation. Pancreas: There is a multicystic fluid signal lesion in the anterior pancreatic head measuring 4.1 x 2.6 x 3.0 cm (series 5, image 26, series 4, image 12). There are multiple associated small calcifications. This is clearly distinct from the pancreatic duct and there is no pancreatic ductal dilatation. No solid contrast enhancing component. Spleen:  Within normal limits in size and appearance. Adrenals/Urinary Tract: No masses identified. Numerous bilateral renal cysts, predominantly parapelvic although also with several cortical cysts. An exophytic cyst of the superior pole of the right kidney is partially calcified and demonstrates mixed intrinsic signal, but demonstrates no contrast enhancement. Remaining cysts are simple and fluid signal without enhancement. No evidence of hydronephrosis. Stomach/Bowel: There is gastric mucosal thickening of the antrum and pylorus and a possible small ulceration of the anterior gastric antrum (series 5, image 22).  Vascular/Lymphatic: No pathologically enlarged lymph nodes identified. No abdominal aortic aneurysm demonstrated. Other:  There is new moderate volume ascites and pneumoperitoneum. Musculoskeletal: No suspicious bone lesions identified. IMPRESSION: 1. There is new moderate volume ascites throughout the abdomen and pneumoperitoneum, concerning for perforated viscus organ. There is gastric mucosal thickening of the  antrum and pylorus and a possible small ulceration of the anterior pylorus, suggesting a perforated gastric ulceration although nidus of perforation is not definitively localized. 2. There is a multicystic fluid signal lesion in the anterior pancreatic head measuring 4.1 cm. This is clearly distinct from the pancreatic duct and there are multiple associated small calcifications. Favor pancreatic pseudocyst although mucinous cystic neoplasm is a substantial differential consideration given appearance. Given lesion size recommend consideration of EUS and FNA for diagnosis. 3. No pancreatic ductal dilatation. 4. Numerous bilateral renal cysts, predominantly parapelvic although also with several cortical cysts. An exophytic cyst of the superior pole of the right kidney is partially calcified and demonstrates mixed intrinsic signal, but demonstrates no contrast enhancement. This is consistent with a benign proteinaceous or hemorrhagic cyst. 5. Cholelithiasis. No evidence of acute cholecystitis. No biliary ductal dilatation. These results were called by telephone at the time of interpretation on 04/22/2020 at 10:17 am to Dr. Manuella Ghazi, who verbally acknowledged these results. Electronically Signed   By: Eddie Candle M.D.   On: 04/22/2020 10:31   DG Chest Port 1 View  Result Date: 04/20/2020 CLINICAL DATA:  Altered mental status. EXAM: PORTABLE CHEST 1 VIEW COMPARISON:  September 09, 2014 FINDINGS: Mild, diffuse chronic appearing increased lung markings are seen without evidence of acute infiltrate, pleural effusion  or pneumothorax. The cardiac silhouette is mildly enlarged. The visualized skeletal structures are unremarkable. IMPRESSION: Chronic appearing increased lung markings without evidence of acute or active cardiopulmonary disease. Electronically Signed   By: Virgina Norfolk M.D.   On: 04/20/2020 21:05   MR ABDOMEN MRCP W WO CONTAST  Result Date: 04/22/2020 CLINICAL DATA:  Pancreatitis suspected, characterize pancreatic lesions, characterize incidental right renal lesion EXAM: MRI ABDOMEN WITHOUT AND WITH CONTRAST (INCLUDING MRCP) TECHNIQUE: Multiplanar multisequence MR imaging of the abdomen was performed both before and after the administration of intravenous contrast. Heavily T2-weighted images of the biliary and pancreatic ducts were obtained, and three-dimensional MRCP images were rendered by post processing. CONTRAST:  42mL GADAVIST GADOBUTROL 1 MMOL/ML IV SOLN COMPARISON:  CT abdomen pelvis, 04/20/2020 FINDINGS: Lower chest: Small right pleural effusion associated atelectasis or consolidation. Trace left pleural effusion. Hepatobiliary: No mass or other parenchymal abnormality identified. Gallstones in the gallbladder. No gallbladder wall thickening. No biliary ductal dilatation. Pancreas: There is a multicystic fluid signal lesion in the anterior pancreatic head measuring 4.1 x 2.6 x 3.0 cm (series 5, image 26, series 4, image 12). There are multiple associated small calcifications. This is clearly distinct from the pancreatic duct and there is no pancreatic ductal dilatation. No solid contrast enhancing component. Spleen:  Within normal limits in size and appearance. Adrenals/Urinary Tract: No masses identified. Numerous bilateral renal cysts, predominantly parapelvic although also with several cortical cysts. An exophytic cyst of the superior pole of the right kidney is partially calcified and demonstrates mixed intrinsic signal, but demonstrates no contrast enhancement. Remaining cysts are simple and  fluid signal without enhancement. No evidence of hydronephrosis. Stomach/Bowel: There is gastric mucosal thickening of the antrum and pylorus and a possible small ulceration of the anterior gastric antrum (series 5, image 22). Vascular/Lymphatic: No pathologically enlarged lymph nodes identified. No abdominal aortic aneurysm demonstrated. Other:  There is new moderate volume ascites and pneumoperitoneum. Musculoskeletal: No suspicious bone lesions identified. IMPRESSION: 1. There is new moderate volume ascites throughout the abdomen and pneumoperitoneum, concerning for perforated viscus organ. There is gastric mucosal thickening of the antrum and pylorus and a possible small ulceration of the anterior pylorus,  suggesting a perforated gastric ulceration although nidus of perforation is not definitively localized. 2. There is a multicystic fluid signal lesion in the anterior pancreatic head measuring 4.1 cm. This is clearly distinct from the pancreatic duct and there are multiple associated small calcifications. Favor pancreatic pseudocyst although mucinous cystic neoplasm is a substantial differential consideration given appearance. Given lesion size recommend consideration of EUS and FNA for diagnosis. 3. No pancreatic ductal dilatation. 4. Numerous bilateral renal cysts, predominantly parapelvic although also with several cortical cysts. An exophytic cyst of the superior pole of the right kidney is partially calcified and demonstrates mixed intrinsic signal, but demonstrates no contrast enhancement. This is consistent with a benign proteinaceous or hemorrhagic cyst. 5. Cholelithiasis. No evidence of acute cholecystitis. No biliary ductal dilatation. These results were called by telephone at the time of interpretation on 04/22/2020 at 10:17 am to Dr. Manuella Ghazi, who verbally acknowledged these results. Electronically Signed   By: Eddie Candle M.D.   On: 04/22/2020 10:31    ROS:  Pertinent items are noted in  HPI.  Blood pressure (!) 92/52, pulse 67, temperature 97.6 F (36.4 C), resp. rate (!) 31, height 5\' 4"  (1.626 m), weight 90.5 kg, SpO2 96 %. Physical Exam: Pleasant black female in moderate distress when Maria Murphy moves Abdomen is diffusely tender with palpation.  Maria Murphy does have rigidity.  MRI and CT scan images personally reviewed  Assessment/Plan: Impression: Acute onset pneumoperitoneum, probably from a peptic ulcer perforation Plan: We will proceed with fluid resuscitation.  Agree with Levophed.  Maria Murphy is already on Zosyn.  Either myself or Dr. Constance Haw will proceed with exploratory laparotomy, possible central line placement.  The risks and benefits of the procedure including bleeding, infection, and cardiopulmonary difficulties were fully explained to the patient and her sister, [name], who gave informed consent.  Foley and NG tube placement have been ordered.  Maria Murphy is receiving fluid boluses.  Aviva Signs 04/22/2020, 11:59 AM

## 2020-04-22 NOTE — Transfer of Care (Addendum)
Immediate Anesthesia Transfer of Care Note  Patient: Maria Murphy  Procedure(s) Performed: EXPLORATORY LAPAROTOMY (N/A ) CENTRAL LINE INSERTION (Right Groin)  Patient Location: PACU and ICU  Anesthesia Type:General  Level of Consciousness: awake, alert  and sedated  Airway & Oxygen Therapy: Patient Spontanous Breathing and Patient connected to face mask oxygen  Post-op Assessment: Report given to RN and Post -op Vital signs reviewed and stable  Post vital signs: Reviewed and stable  Last Vitals:  Vitals Value Taken Time  BP 144/71 1810  Temp 98   Pulse 99 04/22/20 1828  Resp 21 04/22/20 1828  SpO2 95 % 04/22/20 1828  Vitals shown include unvalidated device data.  Last Pain:  Vitals:   04/22/20 1754  TempSrc:   PainSc: 0-No pain      Patients Stated Pain Goal: 0 (65/68/12 7517)  Complications: No complications documented. Kept her intubated and transferred to ICU via continuous monitoring, extubated in ICU, patient's vital signs stable during transportation and in ICU after extubation

## 2020-04-22 NOTE — Addendum Note (Signed)
Addendum  created 04/22/20 1909 by Denese Killings, MD   Order list changed

## 2020-04-22 NOTE — Interval H&P Note (Signed)
History and Physical Interval Note:  04/22/2020 3:15 PM  Maria Murphy  has presented today for surgery, with the diagnosis of perforated viscus.  The various methods of treatment have been discussed with the patient and family. After consideration of risks, benefits and other options for treatment, the patient has consented to  Procedure(s): EXPLORATORY LAPAROTOMY (N/A) as a surgical intervention.  The patient's history has been reviewed, patient examined, no change in status, stable for surgery.  I have reviewed the patient's chart and labs.  Questions were answered to the patient's satisfaction.     Dr. Arnoldo Morale saw patient earlier. I have also seen her and checked on her. Continues to be on levophed at 5 mcg. Received some fluid. More appropriate now and oriented X 4 (self, location, situation, time) and she says that she understand the risk of surgery including but not limited to bleeding, infection and risk of finding something like cancer, risk of being critical ill and needing to stay on the ventilator.   She would want to receive blood if needed, and she is NOT a Jehovah witness. She IS CONSENTING TO BLOOD. She understands risk of allergic reactions and infections.    She has 2 children but does not want them to decide her care/ does not want them to be decision makers.  She has a sister in town who we can relay messages. She wants her sister, Maria Murphy in [place] to be her decision maker if needed. We are ensuring we have her number and will make sure social work is aware of these issues.   This conversation was had in front of Benay Pillow, RN and [facility] RN and Dr. Wyatt Haste.    Virl Cagey

## 2020-04-22 NOTE — Anesthesia Procedure Notes (Signed)
Procedure Name: Intubation Performed by: Tacy Learn, CRNA Pre-anesthesia Checklist: Patient identified, Emergency Drugs available, Suction available, Timeout performed and Patient being monitored Patient Re-evaluated:Patient Re-evaluated prior to induction Oxygen Delivery Method: Circle system utilized Preoxygenation: Pre-oxygenation with 100% oxygen Induction Type: IV induction Laryngoscope Size: Miller and 2 Grade View: Grade II Tube size: 7.0 mm Number of attempts: 1 Airway Equipment and Method: Stylet Placement Confirmation: ETT inserted through vocal cords under direct vision,  positive ETCO2,  breath sounds checked- equal and bilateral and CO2 detector Secured at: 21 cm Tube secured with: Tape Dental Injury: Teeth and Oropharynx as per pre-operative assessment

## 2020-04-22 NOTE — Anesthesia Postprocedure Evaluation (Signed)
Anesthesia Post Note  Patient: Zandra Abts  Procedure(s) Performed: EXPLORATORY LAPAROTOMY (N/A ) CENTRAL LINE INSERTION (Right Groin) GASTRORRHAPHY  Patient location during evaluation: ICU Anesthesia Type: General Level of consciousness: awake and alert, oriented and sedated Pain management: pain level controlled Vital Signs Assessment: post-procedure vital signs reviewed and stable Respiratory status: spontaneous breathing, respiratory function stable and patient connected to nasal cannula oxygen Cardiovascular status: blood pressure returned to baseline (preop levels, on levophed 21mcg/min) Postop Assessment: no apparent nausea or vomiting Anesthetic complications: no   No complications documented.   Last Vitals:  Vitals:   04/22/20 1754 04/22/20 1821  BP: 117/73   Pulse: 91   Resp: 17   Temp: 36.7 C   SpO2: 100% 95%    Last Pain:  Vitals:   04/22/20 1754  TempSrc:   PainSc: 0-No pain                 Ileana Chalupa C Dominick Zertuche

## 2020-04-23 ENCOUNTER — Encounter (HOSPITAL_COMMUNITY): Payer: Self-pay | Admitting: General Surgery

## 2020-04-23 ENCOUNTER — Ambulatory Visit: Payer: Self-pay | Admitting: Orthopedic Surgery

## 2020-04-23 DIAGNOSIS — I959 Hypotension, unspecified: Secondary | ICD-10-CM

## 2020-04-23 LAB — COMPREHENSIVE METABOLIC PANEL
ALT: 22 U/L (ref 0–44)
AST: 59 U/L — ABNORMAL HIGH (ref 15–41)
Albumin: 3.2 g/dL — ABNORMAL LOW (ref 3.5–5.0)
Alkaline Phosphatase: 26 U/L — ABNORMAL LOW (ref 38–126)
Anion gap: 13 (ref 5–15)
BUN: 32 mg/dL — ABNORMAL HIGH (ref 8–23)
CO2: 19 mmol/L — ABNORMAL LOW (ref 22–32)
Calcium: 7.9 mg/dL — ABNORMAL LOW (ref 8.9–10.3)
Chloride: 107 mmol/L (ref 98–111)
Creatinine, Ser: 1.65 mg/dL — ABNORMAL HIGH (ref 0.44–1.00)
GFR calc Af Amer: 38 mL/min — ABNORMAL LOW (ref >60)
GFR calc non Af Amer: 33 mL/min — ABNORMAL LOW (ref >60)
Glucose, Bld: 137 mg/dL — ABNORMAL HIGH (ref 70–99)
Potassium: 4.7 mmol/L (ref 3.5–5.1)
Sodium: 139 mmol/L (ref 135–145)
Total Bilirubin: 0.9 mg/dL (ref 0.3–1.2)
Total Protein: 6.4 g/dL — ABNORMAL LOW (ref 6.5–8.1)

## 2020-04-23 LAB — CBC
HCT: 49.3 % — ABNORMAL HIGH (ref 36.0–46.0)
Hemoglobin: 15.7 g/dL — ABNORMAL HIGH (ref 12.0–15.0)
MCH: 29.6 pg (ref 26.0–34.0)
MCHC: 31.8 g/dL (ref 30.0–36.0)
MCV: 93 fL (ref 80.0–100.0)
Platelets: 166 10*3/uL (ref 150–400)
RBC: 5.3 MIL/uL — ABNORMAL HIGH (ref 3.87–5.11)
RDW: 14.6 % (ref 11.5–15.5)
WBC: 12.4 10*3/uL — ABNORMAL HIGH (ref 4.0–10.5)
nRBC: 0 % (ref 0.0–0.2)

## 2020-04-23 LAB — MAGNESIUM: Magnesium: 1.7 mg/dL (ref 1.7–2.4)

## 2020-04-23 LAB — PHOSPHORUS: Phosphorus: 4 mg/dL (ref 2.5–4.6)

## 2020-04-23 MED ORDER — SODIUM CHLORIDE 0.9 % IV BOLUS
1000.0000 mL | Freq: Once | INTRAVENOUS | Status: AC
  Administered 2020-04-23: 1000 mL via INTRAVENOUS

## 2020-04-23 MED ORDER — FENTANYL CITRATE (PF) 100 MCG/2ML IJ SOLN
25.0000 ug | INTRAMUSCULAR | Status: DC | PRN
  Administered 2020-04-23 – 2020-05-03 (×15): 25 ug via INTRAVENOUS
  Filled 2020-04-23 (×15): qty 2

## 2020-04-23 MED ORDER — SODIUM CHLORIDE 0.9 % IV BOLUS
500.0000 mL | Freq: Once | INTRAVENOUS | Status: AC
  Administered 2020-04-23: 500 mL via INTRAVENOUS

## 2020-04-23 MED ORDER — MENTHOL 3 MG MT LOZG
1.0000 | LOZENGE | OROMUCOSAL | Status: DC | PRN
  Administered 2020-04-25: 3 mg via ORAL
  Filled 2020-04-23 (×2): qty 9

## 2020-04-23 NOTE — Progress Notes (Addendum)
1 Day Post-Op  Subjective: Maria Murphy reports feeling better today than yesterday. She has no abdominal pain at rest but has intermittent right lower quadrant cramping near her JP tube. She denies n/v since surgery and has had no trouble with eating ice chips. She denies having any BM and flatus.  Denies fever, body aches, chills, chest pain, shortness of breath.  Objective: Vital signs in last 24 hours: Temp:  [97.6 F (36.4 C)-98.1 F (36.7 C)] 98.1 F (36.7 C) (08/31 0500) Pulse Rate:  [63-91] 79 (08/31 0645) Resp:  [12-71] 23 (08/31 0645) BP: (64-136)/(42-92) 136/70 (08/31 0630) SpO2:  [92 %-100 %] 98 % (08/31 0645) Arterial Line BP: (88-122)/(51-71) 112/57 (08/31 0645)    Intake/Output from previous day: 08/30 0701 - 08/31 0700 In: 6656.6 [I.V.:5195.9; IV Piggyback:1460.7] Out: 1050 [Urine:600; Emesis/NG output:250; Drains:175; Blood:25] Intake/Output this shift: No intake/output data recorded.  Physical Exam Vitals and nursing note reviewed.  Constitutional: no acute distress, tired appearing and resting comfortably in bed, appears dry Cardiovascular: regular rate and rhythm, normal heart sounds Pulmonary: effort normal, normal breath sounds bilaterally Abdominal: flat and soft, slight TTP diffusely, no rebound tenderness, bowel sounds normal, surgical site without erythema, warmth, or drainage Skin: warm and dry, appears dry  Lab Results:  Recent Labs    04/21/20 1835 04/23/20 0451  WBC 9.3 12.4*  HGB 13.6 15.7*  HCT 40.1 49.3*  PLT 175 166   BMET Recent Labs    04/22/20 0629 04/23/20 0451  NA 139 139  K 3.8 4.7  CL 108 107  CO2 20* 19*  GLUCOSE 130* 137*  BUN 24* 32*  CREATININE 0.98 1.65*  CALCIUM 8.3* 7.9*   Studies/Results: MR 3D Recon At Scanner  Result Date: 04/22/2020 CLINICAL DATA:  Pancreatitis suspected, characterize pancreatic lesions, characterize incidental right renal lesion EXAM: MRI ABDOMEN WITHOUT AND WITH CONTRAST (INCLUDING  MRCP) TECHNIQUE: Multiplanar multisequence MR imaging of the abdomen was performed both before and after the administration of intravenous contrast. Heavily T2-weighted images of the biliary and pancreatic ducts were obtained, and three-dimensional MRCP images were rendered by post processing. CONTRAST:  38mL GADAVIST GADOBUTROL 1 MMOL/ML IV SOLN COMPARISON:  CT abdomen pelvis, 04/20/2020 FINDINGS: Lower chest: Small right pleural effusion associated atelectasis or consolidation. Trace left pleural effusion. Hepatobiliary: No mass or other parenchymal abnormality identified. Gallstones in the gallbladder. No gallbladder wall thickening. No biliary ductal dilatation. Pancreas: There is a multicystic fluid signal lesion in the anterior pancreatic head measuring 4.1 x 2.6 x 3.0 cm (series 5, image 26, series 4, image 12). There are multiple associated small calcifications. This is clearly distinct from the pancreatic duct and there is no pancreatic ductal dilatation. No solid contrast enhancing component. Spleen:  Within normal limits in size and appearance. Adrenals/Urinary Tract: No masses identified. Numerous bilateral renal cysts, predominantly parapelvic although also with several cortical cysts. An exophytic cyst of the superior pole of the right kidney is partially calcified and demonstrates mixed intrinsic signal, but demonstrates no contrast enhancement. Remaining cysts are simple and fluid signal without enhancement. No evidence of hydronephrosis. Stomach/Bowel: There is gastric mucosal thickening of the antrum and pylorus and a possible small ulceration of the anterior gastric antrum (series 5, image 22). Vascular/Lymphatic: No pathologically enlarged lymph nodes identified. No abdominal aortic aneurysm demonstrated. Other:  There is new moderate volume ascites and pneumoperitoneum. Musculoskeletal: No suspicious bone lesions identified. IMPRESSION: 1. There is new moderate volume ascites throughout the  abdomen and pneumoperitoneum, concerning for perforated viscus organ.  There is gastric mucosal thickening of the antrum and pylorus and a possible small ulceration of the anterior pylorus, suggesting a perforated gastric ulceration although nidus of perforation is not definitively localized. 2. There is a multicystic fluid signal lesion in the anterior pancreatic head measuring 4.1 cm. This is clearly distinct from the pancreatic duct and there are multiple associated small calcifications. Favor pancreatic pseudocyst although mucinous cystic neoplasm is a substantial differential consideration given appearance. Given lesion size recommend consideration of EUS and FNA for diagnosis. 3. No pancreatic ductal dilatation. 4. Numerous bilateral renal cysts, predominantly parapelvic although also with several cortical cysts. An exophytic cyst of the superior pole of the right kidney is partially calcified and demonstrates mixed intrinsic signal, but demonstrates no contrast enhancement. This is consistent with a benign proteinaceous or hemorrhagic cyst. 5. Cholelithiasis. No evidence of acute cholecystitis. No biliary ductal dilatation. These results were called by telephone at the time of interpretation on 04/22/2020 at 10:17 am to Dr. Manuella Ghazi, who verbally acknowledged these results. Electronically Signed   By: Eddie Candle M.D.   On: 04/22/2020 10:31   DG Chest Port 1 View  Result Date: 04/22/2020 CLINICAL DATA:  Peri op EXAM: PORTABLE CHEST 1 VIEW COMPARISON:  04/22/2020, CT 04/20/2020, MRI 04/22/2020 FINDINGS: Esophageal tube tip overlies the gastric outlet. New cutaneous staples over the right hemiabdomen. Low lung volumes. Borderline cardiomegaly. Patchy atelectasis at the bases. Resolution of previously noted free air beneath the right diaphragm. No pneumothorax. IMPRESSION: 1. Esophageal tube tip overlies the gastric outlet. 2. Low lung volumes with patchy basilar atelectasis. Resolution of previously noted free  air beneath the right diaphragm Electronically Signed   By: Donavan Foil M.D.   On: 04/22/2020 20:08   DG CHEST PORT 1 VIEW  Result Date: 04/22/2020 CLINICAL DATA:  Pneumoperitoneum. EXAM: PORTABLE CHEST 1 VIEW COMPARISON:  04/22/2020 MR and prior studies FINDINGS: Pneumoperitoneum underlying the RIGHT hemidiaphragm is noted, identified on earlier MRI. An NG tube is present with tip overlying the distal stomach. Bilateral LOWER lung opacities/atelectasis are present. There is no evidence of pneumothorax. IMPRESSION: 1. Pneumoperitoneum underlying the RIGHT hemidiaphragm. 2. Bilateral LOWER lung opacities/atelectasis. Electronically Signed   By: Margarette Canada M.D.   On: 04/22/2020 12:43   MR ABDOMEN MRCP W WO CONTAST  Result Date: 04/22/2020 CLINICAL DATA:  Pancreatitis suspected, characterize pancreatic lesions, characterize incidental right renal lesion EXAM: MRI ABDOMEN WITHOUT AND WITH CONTRAST (INCLUDING MRCP) TECHNIQUE: Multiplanar multisequence MR imaging of the abdomen was performed both before and after the administration of intravenous contrast. Heavily T2-weighted images of the biliary and pancreatic ducts were obtained, and three-dimensional MRCP images were rendered by post processing. CONTRAST:  18mL GADAVIST GADOBUTROL 1 MMOL/ML IV SOLN COMPARISON:  CT abdomen pelvis, 04/20/2020 FINDINGS: Lower chest: Small right pleural effusion associated atelectasis or consolidation. Trace left pleural effusion. Hepatobiliary: No mass or other parenchymal abnormality identified. Gallstones in the gallbladder. No gallbladder wall thickening. No biliary ductal dilatation. Pancreas: There is a multicystic fluid signal lesion in the anterior pancreatic head measuring 4.1 x 2.6 x 3.0 cm (series 5, image 26, series 4, image 12). There are multiple associated small calcifications. This is clearly distinct from the pancreatic duct and there is no pancreatic ductal dilatation. No solid contrast enhancing component.  Spleen:  Within normal limits in size and appearance. Adrenals/Urinary Tract: No masses identified. Numerous bilateral renal cysts, predominantly parapelvic although also with several cortical cysts. An exophytic cyst of the superior pole of the right kidney is  partially calcified and demonstrates mixed intrinsic signal, but demonstrates no contrast enhancement. Remaining cysts are simple and fluid signal without enhancement. No evidence of hydronephrosis. Stomach/Bowel: There is gastric mucosal thickening of the antrum and pylorus and a possible small ulceration of the anterior gastric antrum (series 5, image 22). Vascular/Lymphatic: No pathologically enlarged lymph nodes identified. No abdominal aortic aneurysm demonstrated. Other:  There is new moderate volume ascites and pneumoperitoneum. Musculoskeletal: No suspicious bone lesions identified. IMPRESSION: 1. There is new moderate volume ascites throughout the abdomen and pneumoperitoneum, concerning for perforated viscus organ. There is gastric mucosal thickening of the antrum and pylorus and a possible small ulceration of the anterior pylorus, suggesting a perforated gastric ulceration although nidus of perforation is not definitively localized. 2. There is a multicystic fluid signal lesion in the anterior pancreatic head measuring 4.1 cm. This is clearly distinct from the pancreatic duct and there are multiple associated small calcifications. Favor pancreatic pseudocyst although mucinous cystic neoplasm is a substantial differential consideration given appearance. Given lesion size recommend consideration of EUS and FNA for diagnosis. 3. No pancreatic ductal dilatation. 4. Numerous bilateral renal cysts, predominantly parapelvic although also with several cortical cysts. An exophytic cyst of the superior pole of the right kidney is partially calcified and demonstrates mixed intrinsic signal, but demonstrates no contrast enhancement. This is consistent with a  benign proteinaceous or hemorrhagic cyst. 5. Cholelithiasis. No evidence of acute cholecystitis. No biliary ductal dilatation. These results were called by telephone at the time of interpretation on 04/22/2020 at 10:17 am to Dr. Manuella Ghazi, who verbally acknowledged these results. Electronically Signed   By: Eddie Candle M.D.   On: 04/22/2020 10:31    Anti-infectives: Anti-infectives (From admission, onward)   Start     Dose/Rate Route Frequency Ordered Stop   04/22/20 0100  piperacillin-tazobactam (ZOSYN) IVPB 3.375 g        3.375 g 12.5 mL/hr over 240 Minutes Intravenous Every 8 hours 04/22/20 0002     04/22/20 0000  piperacillin-tazobactam (ZOSYN) IVPB 3.375 g  Status:  Discontinued        3.375 g 100 mL/hr over 30 Minutes Intravenous Every 8 hours 04/21/20 2349 04/22/20 0002      Assessment/Plan: s/p Procedure(s): EXPLORATORY LAPAROTOMY CENTRAL LINE INSERTION GASTRORRHAPHY Maria Murphy is a 64 yo female 1 day post-op from exploratory laparotomy, gastrorrhaphy, and femoral line insertion. Pt is feeling well today with abdominal pain well controlled on Fentanyl. Has had no n/v and denies having BM or flatus since surgery. Hemodynamically stable at this time, last dose Levophed at 8:09 this AM. Having some crampy pain around JP tube that is likely due to muscle irritation at the site. Leukocytosis at 12.4 from 9.3, this is not surprising given gastric perforation and inflammatory reaction, remains afebrile. F/u CBC in AM and monitor for improvement. sCr elevated at 1.65 from 0.98 yesterday with only small amount of concentrated urine output since surgery. Likely prerenal in etiology given hypotension yesterday and use of levophed for BP support. Appears dry on exam and hemoconcentrated at hct to 49.3 from 40.1. Will provide 1 L fluid bolus this AM and watch for improvement, proceed cautiously given mild cardiomegaly on CXR. Continue to monitor Mag and Phos over the next several days and replete as  necessary. -IV NS 150 mL/hr -fentanyl 37mcg q2h PRN -Zosyn 3.375g q8h -avoid nephrotoxic medications including NSAIDs -f/u CBC, Mag, Phos in AM    LOS: 1 day    Maria Murphy 04/23/2020

## 2020-04-23 NOTE — Progress Notes (Signed)
PROGRESS NOTE    Jamella Neri  ZOX:[id number] DOB: [birth year] DOA: 04/21/2020 PCP: Avon Gully, MD   Brief Narrative:  Per HPI: Maria Murphy a 64 y.o.femalewith medical history significant ofHTN, decreased memory on medication.Ms Reinantewas seenin [facility], 8/28/21and evaluated for possible code stroke with negative results.Shealsoreports havinghadwaxing and waning of upper abdominal pain for several days. She returnsto [facility]today for increasingepigastricpain with vomiting that began this morning. She does admit to drinking more alcohol recently along with taking Aleve for her aches and pains of her joints. She had a CT scan of her abdomen and pelvis8/28/21that revealedcholelithiasis without gallbladder inflammation or biliary dilatation. Coarse calcificationswere notedin the pancreatic head consistent withchronicpancreatitis. There were 3 cystic lesions in the pancreatic head with the largest measuring 2.2 cm. And she was found to have a bilirubin of 3.  8/30: Patient was admitted with diffuse abdominal pain likely related to chronic pancreatitis and had undergone MRCP this morning with results demonstrating acute pneumoperitoneum and ascites.  She subsequently was noted to be quite hypotensive and a rapid response was called.  She has been transferred to ICU for closer management and has been started on pressors with fluid bolus ongoing.  General surgery notified for urgent need of patient to go to the OR for exploratory laparotomy.  General surgery has discussed with sister.  8/31: Patient is doing well after surgery this morning.  She denies significant abdominal pain.  She continues to remain on Levophed for blood pressure support this morning which will be weaned.  Creatinine levels noted to be somewhat elevated.  She will need continued monitoring and will be transferred to general surgery service.  Assessment & Plan:   Active Problems:    Essential hypertension, benign   Hyperbilirubinemia   Epigastric pain   Perforated viscus   Perforated abdominal viscus   Peptic ulcer with perforation (HCC)   Acute pneumoperitoneum likely secondary to peptic ulcer perforation -Status post exploratory laparotomy with gastrorrhaphy POD #1 -Continue on Zosyn for abdominal coverage -Fentanyl as needed for pain control -PPI IV twice daily -Advance diet per general surgery -Continue IV fluid  Acute hypotension likely secondary to above-improving -Wean pressors as tolerated -Consider removal of a line and femoral line once off pressors and stable  Chronic pancreatitis with cystic lesions -Continue on aggressive IV fluid and n.p.o. for time being -Likely related to chronic alcohol intake which will need to be used continued  Pancreatic pseudocyst versus mucinous neoplasm -Recommend EGD with FNA at a later date once stable for further evaluation  Hemorrhagic renal cyst/multiple cysts -Continue following repeat imaging, does not appear to be of clinical consequence  History of hypertension -Hold blood pressure agents in the setting of hypotension  Memory impairment -Hold donepezil and keep strict n.p.o. in anticipation for OR management  Hospitalist team will sign off for now, please reconsult as needed.   DVT prophylaxis:  Lovenox Code Status: Full code Family Communication:  Discussed with sister at bedside on 8/30 Disposition Plan:  Status is: Inpatient  Remains inpatient appropriate because:IV treatments appropriate due to intensity of illness or inability to take PO   Dispo: The patient is from: Home              Anticipated d/c is to: Home              Anticipated d/c date is: 3 days              Patient currently is not medically stable to d/c.  Consultants:   GI  General surgery  Procedures:  Exploratory laparotomy with gastrorrhaphy and central line placement 8/30  Antimicrobials:   Anti-infectives (From admission, onward)   Start     Dose/Rate Route Frequency Ordered Stop   04/22/20 0100  piperacillin-tazobactam (ZOSYN) IVPB 3.375 g        3.375 g 12.5 mL/hr over 240 Minutes Intravenous Every 8 hours 04/22/20 0002     04/22/20 0000  piperacillin-tazobactam (ZOSYN) IVPB 3.375 g  Status:  Discontinued        3.375 g 100 mL/hr over 30 Minutes Intravenous Every 8 hours 04/21/20 2349 04/22/20 0002       Subjective: Patient seen and evaluated today with no new acute complaints or concerns. No acute concerns or events noted overnight.  She continues to remain on some vasopressor support this morning, but no significant concerns otherwise noted except for mild abdominal pain.  Objective: Vitals:   04/23/20 0500 04/23/20 0615 04/23/20 0630 04/23/20 0645  BP:  128/74 136/70   Pulse: 70 73 78 79  Resp: (!) 22 17 (!) 22 (!) 23  Temp: 98.1 F (36.7 C)     TempSrc: Oral     SpO2: 99% 99% 98% 98%  Weight:      Height:        Intake/Output Summary (Last 24 hours) at 04/23/2020 0745 Last data filed at 04/23/2020 0400 Gross per 24 hour  Intake 6656.6 ml  Output 1050 ml  Net 5606.6 ml   Filed Weights   04/21/20 1352 04/22/20 0037  Weight: 90.7 kg 90.5 kg    Examination:  General exam: Appears calm and comfortable  Respiratory system: Clear to auscultation. Respiratory effort normal.  Currently on nasal cannula oxygen support Cardiovascular system: S1 & S2 heard, RRR. Gastrointestinal system: Abdomen is nondistended, soft and nontender.  Incision clean dry and intact with JP drain with serosanguineous fluid. Central nervous system: Alert and oriented. No focal neurological deficits. Extremities: Symmetric 5 x 5 power. Skin: No rashes, lesions or ulcers Psychiatry: Judgement and insight appear normal. Mood & affect appropriate.     Data Reviewed: I have personally reviewed following labs and imaging studies  CBC: Recent Labs  Lab 04/20/20 2022  04/21/20 1835 04/23/20 0451  WBC 7.5 9.3 12.4*  NEUTROABS 5.2 7.8*  --   HGB 12.4 13.6 15.7*  HCT 37.1 40.1 49.3*  MCV 89.2 89.7 93.0  PLT 173 175 166   Basic Metabolic Panel: Recent Labs  Lab 04/20/20 2022 04/21/20 1835 04/22/20 0629 04/23/20 0451  NA 141 138 139 139  K 4.1 3.7 3.8 4.7  CL 105 105 108 107  CO2 22 21* 20* 19*  GLUCOSE 97 94 130* 137*  BUN 24* 20 24* 32*  CREATININE 1.31* 1.03* 0.98 1.65*  CALCIUM 8.8* 8.8* 8.3* 7.9*  MG  --   --   --  1.7  PHOS  --   --   --  4.0   GFR: Estimated Creatinine Clearance: 38 mL/min (A) (by C-G formula based on SCr of 1.65 mg/dL (H)). Liver Function Tests: Recent Labs  Lab 04/20/20 2022 04/21/20 1835 04/22/20 0629 04/23/20 0451  AST 22 18 21  59*  ALT 9 9 7 22   ALKPHOS 37* 37* 27* 26*  BILITOT 3.0* 4.3* 1.3* 0.9  PROT 7.3 7.8 6.4* 6.4*  ALBUMIN 4.2 4.5 3.8 3.2*   Recent Labs  Lab 04/21/20 1835  LIPASE 33   Recent Labs  Lab 04/20/20 2022  AMMONIA 36*  Coagulation Profile: No results for input(s): INR, PROTIME in the last 168 hours. Cardiac Enzymes: No results for input(s): CKTOTAL, CKMB, CKMBINDEX, TROPONINI in the last 168 hours. BNP (last 3 results) No results for input(s): PROBNP in the last 8760 hours. HbA1C: No results for input(s): HGBA1C in the last 72 hours. CBG: Recent Labs  Lab 04/20/20 2019  GLUCAP 96   Lipid Profile: No results for input(s): CHOL, HDL, LDLCALC, TRIG, CHOLHDL, LDLDIRECT in the last 72 hours. Thyroid Function Tests: No results for input(s): TSH, T4TOTAL, FREET4, T3FREE, THYROIDAB in the last 72 hours. Anemia Panel: No results for input(s): VITAMINB12, FOLATE, FERRITIN, TIBC, IRON, RETICCTPCT in the last 72 hours. Sepsis Labs: Recent Labs  Lab 04/20/20 2022  LATICACIDVEN 1.4    Recent Results (from the past 240 hour(s))  Urine Culture     Status: None   Collection Time: 04/20/20  8:20 PM   Specimen: Urine, Catheterized  Result Value Ref Range Status   Specimen  Description   Final    URINE, CATHETERIZED Performed at [facility], [address] [place], [place] [address]    Special Requests   Final    NONE Performed at [facility], [address] [place], [place] [address]    Culture   Final    NO GROWTH Performed at [facility], [address], [place], [place] [address]    Report Status 04/22/2020 FINAL  Final  SARS Coronavirus 2 by RT PCR (hospital order, performed in [facility] hospital lab) Nasopharyngeal Nasopharyngeal Swab     Status: None   Collection Time: 04/20/20  8:21 PM   Specimen: Nasopharyngeal Swab  Result Value Ref Range Status   SARS Coronavirus 2 NEGATIVE NEGATIVE Final    Comment: (NOTE) SARS-CoV-2 target nucleic acids are NOT DETECTED.  The SARS-CoV-2 RNA is generally detectable in upper and lower respiratory specimens during the acute phase of infection. The lowest concentration of SARS-CoV-2 viral copies this assay can detect is 250 copies / mL. A negative result does not preclude SARS-CoV-2 infection and should not be used as the sole basis for treatment or other patient management decisions.  A negative result may occur with improper specimen collection / handling, submission of specimen other than nasopharyngeal swab, presence of viral mutation(s) within the areas targeted by this assay, and inadequate number of viral copies (<250 copies / mL). A negative result must be combined with clinical observations, patient history, and epidemiological information.  Fact Sheet for Patients:   BoilerBrush.com.cy  Fact Sheet for Healthcare Providers: https://pope.com/  This test is not yet approved or  cleared by the [place] FDA and has been authorized for detection and/or diagnosis of SARS-CoV-2 by FDA under an Emergency Use Authorization (EUA).  This EUA will remain in effect (meaning this test can be used) for the duration of the COVID-19 declaration  under Section 564(b)(1) of the Act, 21 U.S.C. section 360bbb-3(b)(1), unless the authorization is terminated or revoked sooner.  Performed at [facility], [address], [place], [place] [address]   SARS Coronavirus 2 by RT PCR (hospital order, performed in [facility] hospital lab) Nasopharyngeal Nasopharyngeal Swab     Status: None   Collection Time: 04/22/20 12:16 AM   Specimen: Nasopharyngeal Swab  Result Value Ref Range Status   SARS Coronavirus 2 NEGATIVE NEGATIVE Final    Comment: (NOTE) SARS-CoV-2 target nucleic acids are NOT DETECTED.  The SARS-CoV-2 RNA is generally detectable in upper and lower respiratory specimens during the acute  phase of infection. The lowest concentration of SARS-CoV-2 viral copies this assay can detect is 250 copies / mL. A negative result does not preclude SARS-CoV-2 infection and should not be used as the sole basis for treatment or other patient management decisions.  A negative result may occur with improper specimen collection / handling, submission of specimen other than nasopharyngeal swab, presence of viral mutation(s) within the areas targeted by this assay, and inadequate number of viral copies (<250 copies / mL). A negative result must be combined with clinical observations, patient history, and epidemiological information.  Fact Sheet for Patients:   BoilerBrush.com.cy  Fact Sheet for Healthcare Providers: https://pope.com/  This test is not yet approved or  cleared by the [place] FDA and has been authorized for detection and/or diagnosis of SARS-CoV-2 by FDA under an Emergency Use Authorization (EUA).  This EUA will remain in effect (meaning this test can be used) for the duration of the COVID-19 declaration under Section 564(b)(1) of the Act, 21 U.S.C. section 360bbb-3(b)(1), unless the authorization is terminated or revoked sooner.  Performed at [facility], [address], [place], [place] [address]   MRSA PCR Screening     Status: None   Collection Time: 04/22/20 11:23 AM   Specimen: Nasopharyngeal  Result Value Ref Range Status   MRSA by PCR NEGATIVE NEGATIVE Final    Comment:        The GeneXpert MRSA Assay (FDA approved for NASAL specimens only), is one component of a comprehensive MRSA colonization surveillance program. It is not intended to diagnose MRSA infection nor to guide or monitor treatment for MRSA infections. Performed at [facility], [address] [place], [place] [address]   Aerobic Culture (superficial specimen)     Status: None (Preliminary result)   Collection Time: 04/22/20  6:45 PM   Specimen: Wound  Result Value Ref Range Status   Specimen Description WOUND DRAINAGE  Final   Special Requests INTRA ABDOMINAL FLUID  Final   Gram Stain   Final    RARE WBC PRESENT,BOTH PMN AND MONONUCLEAR NO ORGANISMS SEEN Performed at [facility], [address] [place], [place] [address]    Culture PENDING  Incomplete   Report Status PENDING  Incomplete         Radiology Studies: MR 3D Recon At Scanner  Result Date: 04/22/2020 CLINICAL DATA:  Pancreatitis suspected, characterize pancreatic lesions, characterize incidental right renal lesion EXAM: MRI ABDOMEN WITHOUT AND WITH CONTRAST (INCLUDING MRCP) TECHNIQUE: Multiplanar multisequence MR imaging of the abdomen was performed both before and after the administration of intravenous contrast. Heavily T2-weighted images of the biliary and pancreatic ducts were obtained, and three-dimensional MRCP images were rendered by post processing. CONTRAST:  7mL GADAVIST GADOBUTROL 1 MMOL/ML IV SOLN COMPARISON:  CT abdomen pelvis, 04/20/2020 FINDINGS: Lower chest: Small right pleural effusion associated atelectasis or consolidation. Trace left pleural effusion. Hepatobiliary: No mass or other parenchymal abnormality identified. Gallstones in the gallbladder. No gallbladder wall thickening. No  biliary ductal dilatation. Pancreas: There is a multicystic fluid signal lesion in the anterior pancreatic head measuring 4.1 x 2.6 x 3.0 cm (series 5, image 26, series 4, image 12). There are multiple associated small calcifications. This is clearly distinct from the pancreatic duct and there is no pancreatic ductal dilatation. No solid contrast enhancing component. Spleen:  Within normal limits in size and appearance. Adrenals/Urinary Tract: No masses identified. Numerous bilateral renal cysts, predominantly parapelvic although also with several cortical cysts. An exophytic cyst  of the superior pole of the right kidney is partially calcified and demonstrates mixed intrinsic signal, but demonstrates no contrast enhancement. Remaining cysts are simple and fluid signal without enhancement. No evidence of hydronephrosis. Stomach/Bowel: There is gastric mucosal thickening of the antrum and pylorus and a possible small ulceration of the anterior gastric antrum (series 5, image 22). Vascular/Lymphatic: No pathologically enlarged lymph nodes identified. No abdominal aortic aneurysm demonstrated. Other:  There is new moderate volume ascites and pneumoperitoneum. Musculoskeletal: No suspicious bone lesions identified. IMPRESSION: 1. There is new moderate volume ascites throughout the abdomen and pneumoperitoneum, concerning for perforated viscus organ. There is gastric mucosal thickening of the antrum and pylorus and a possible small ulceration of the anterior pylorus, suggesting a perforated gastric ulceration although nidus of perforation is not definitively localized. 2. There is a multicystic fluid signal lesion in the anterior pancreatic head measuring 4.1 cm. This is clearly distinct from the pancreatic duct and there are multiple associated small calcifications. Favor pancreatic pseudocyst although mucinous cystic neoplasm is a substantial differential consideration given appearance. Given lesion size recommend  consideration of EUS and FNA for diagnosis. 3. No pancreatic ductal dilatation. 4. Numerous bilateral renal cysts, predominantly parapelvic although also with several cortical cysts. An exophytic cyst of the superior pole of the right kidney is partially calcified and demonstrates mixed intrinsic signal, but demonstrates no contrast enhancement. This is consistent with a benign proteinaceous or hemorrhagic cyst. 5. Cholelithiasis. No evidence of acute cholecystitis. No biliary ductal dilatation. These results were called by telephone at the time of interpretation on 04/22/2020 at 10:17 am to Dr. Sherryll Burger, who verbally acknowledged these results. Electronically Signed   By: Lauralyn Primes M.D.   On: 04/22/2020 10:31   DG Chest Port 1 View  Result Date: 04/22/2020 CLINICAL DATA:  Peri op EXAM: PORTABLE CHEST 1 VIEW COMPARISON:  04/22/2020, CT 04/20/2020, MRI 04/22/2020 FINDINGS: Esophageal tube tip overlies the gastric outlet. New cutaneous staples over the right hemiabdomen. Low lung volumes. Borderline cardiomegaly. Patchy atelectasis at the bases. Resolution of previously noted free air beneath the right diaphragm. No pneumothorax. IMPRESSION: 1. Esophageal tube tip overlies the gastric outlet. 2. Low lung volumes with patchy basilar atelectasis. Resolution of previously noted free air beneath the right diaphragm Electronically Signed   By: Jasmine Pang M.D.   On: 04/22/2020 20:08   DG CHEST PORT 1 VIEW  Result Date: 04/22/2020 CLINICAL DATA:  Pneumoperitoneum. EXAM: PORTABLE CHEST 1 VIEW COMPARISON:  04/22/2020 MR and prior studies FINDINGS: Pneumoperitoneum underlying the RIGHT hemidiaphragm is noted, identified on earlier MRI. An NG tube is present with tip overlying the distal stomach. Bilateral LOWER lung opacities/atelectasis are present. There is no evidence of pneumothorax. IMPRESSION: 1. Pneumoperitoneum underlying the RIGHT hemidiaphragm. 2. Bilateral LOWER lung opacities/atelectasis. Electronically  Signed   By: Harmon Pier M.D.   On: 04/22/2020 12:43   MR ABDOMEN MRCP W WO CONTAST  Result Date: 04/22/2020 CLINICAL DATA:  Pancreatitis suspected, characterize pancreatic lesions, characterize incidental right renal lesion EXAM: MRI ABDOMEN WITHOUT AND WITH CONTRAST (INCLUDING MRCP) TECHNIQUE: Multiplanar multisequence MR imaging of the abdomen was performed both before and after the administration of intravenous contrast. Heavily T2-weighted images of the biliary and pancreatic ducts were obtained, and three-dimensional MRCP images were rendered by post processing. CONTRAST:  7mL GADAVIST GADOBUTROL 1 MMOL/ML IV SOLN COMPARISON:  CT abdomen pelvis, 04/20/2020 FINDINGS: Lower chest: Small right pleural effusion associated atelectasis or consolidation. Trace left pleural effusion. Hepatobiliary: No mass or other parenchymal abnormality  identified. Gallstones in the gallbladder. No gallbladder wall thickening. No biliary ductal dilatation. Pancreas: There is a multicystic fluid signal lesion in the anterior pancreatic head measuring 4.1 x 2.6 x 3.0 cm (series 5, image 26, series 4, image 12). There are multiple associated small calcifications. This is clearly distinct from the pancreatic duct and there is no pancreatic ductal dilatation. No solid contrast enhancing component. Spleen:  Within normal limits in size and appearance. Adrenals/Urinary Tract: No masses identified. Numerous bilateral renal cysts, predominantly parapelvic although also with several cortical cysts. An exophytic cyst of the superior pole of the right kidney is partially calcified and demonstrates mixed intrinsic signal, but demonstrates no contrast enhancement. Remaining cysts are simple and fluid signal without enhancement. No evidence of hydronephrosis. Stomach/Bowel: There is gastric mucosal thickening of the antrum and pylorus and a possible small ulceration of the anterior gastric antrum (series 5, image 22). Vascular/Lymphatic: No  pathologically enlarged lymph nodes identified. No abdominal aortic aneurysm demonstrated. Other:  There is new moderate volume ascites and pneumoperitoneum. Musculoskeletal: No suspicious bone lesions identified. IMPRESSION: 1. There is new moderate volume ascites throughout the abdomen and pneumoperitoneum, concerning for perforated viscus organ. There is gastric mucosal thickening of the antrum and pylorus and a possible small ulceration of the anterior pylorus, suggesting a perforated gastric ulceration although nidus of perforation is not definitively localized. 2. There is a multicystic fluid signal lesion in the anterior pancreatic head measuring 4.1 cm. This is clearly distinct from the pancreatic duct and there are multiple associated small calcifications. Favor pancreatic pseudocyst although mucinous cystic neoplasm is a substantial differential consideration given appearance. Given lesion size recommend consideration of EUS and FNA for diagnosis. 3. No pancreatic ductal dilatation. 4. Numerous bilateral renal cysts, predominantly parapelvic although also with several cortical cysts. An exophytic cyst of the superior pole of the right kidney is partially calcified and demonstrates mixed intrinsic signal, but demonstrates no contrast enhancement. This is consistent with a benign proteinaceous or hemorrhagic cyst. 5. Cholelithiasis. No evidence of acute cholecystitis. No biliary ductal dilatation. These results were called by telephone at the time of interpretation on 04/22/2020 at 10:17 am to Dr. Sherryll Burger, who verbally acknowledged these results. Electronically Signed   By: Lauralyn Primes M.D.   On: 04/22/2020 10:31        Scheduled Meds: . Chlorhexidine Gluconate Cloth  6 each Topical Daily  . enoxaparin (LOVENOX) injection  40 mg Subcutaneous Q24H  . pantoprazole (PROTONIX) IV  40 mg Intravenous Q12H  . sodium chloride flush  10-40 mL Intracatheter Q12H  . sodium chloride flush  3 mL Intravenous Q12H    Continuous Infusions: . sodium chloride 150 mL/hr at 04/23/20 0510  . norepinephrine (LEVOPHED) Adult infusion 7 mcg/min (04/23/20 0114)  . piperacillin-tazobactam (ZOSYN)  IV 3.375 g (04/23/20 0019)     LOS: 1 day    Time spent: 35 minutes    Rehanna Oloughlin D Sherryll Burger, DO [facility]  If 7PM-7AM, please contact night-coverage [email or web address] 04/23/2020, 7:45 AM

## 2020-04-24 ENCOUNTER — Encounter: Payer: Self-pay | Admitting: Gastroenterology

## 2020-04-24 ENCOUNTER — Telehealth: Payer: Self-pay | Admitting: Gastroenterology

## 2020-04-24 DIAGNOSIS — I2699 Other pulmonary embolism without acute cor pulmonale: Secondary | ICD-10-CM

## 2020-04-24 HISTORY — DX: Other pulmonary embolism without acute cor pulmonale: I26.99

## 2020-04-24 LAB — COMPREHENSIVE METABOLIC PANEL
ALT: 14 U/L (ref 0–44)
AST: 25 U/L (ref 15–41)
Albumin: 2.5 g/dL — ABNORMAL LOW (ref 3.5–5.0)
Alkaline Phosphatase: 23 U/L — ABNORMAL LOW (ref 38–126)
Anion gap: 9 (ref 5–15)
BUN: 24 mg/dL — ABNORMAL HIGH (ref 8–23)
CO2: 20 mmol/L — ABNORMAL LOW (ref 22–32)
Calcium: 7.4 mg/dL — ABNORMAL LOW (ref 8.9–10.3)
Chloride: 111 mmol/L (ref 98–111)
Creatinine, Ser: 1.06 mg/dL — ABNORMAL HIGH (ref 0.44–1.00)
GFR calc Af Amer: >60 mL/min (ref >60)
GFR calc non Af Amer: 56 mL/min — ABNORMAL LOW (ref >60)
Glucose, Bld: 84 mg/dL (ref 70–99)
Potassium: 3.5 mmol/L (ref 3.5–5.1)
Sodium: 140 mmol/L (ref 135–145)
Total Bilirubin: 0.8 mg/dL (ref 0.3–1.2)
Total Protein: 5.3 g/dL — ABNORMAL LOW (ref 6.5–8.1)

## 2020-04-24 LAB — CBC
HCT: 34.1 % — ABNORMAL LOW (ref 36.0–46.0)
Hemoglobin: 10.9 g/dL — ABNORMAL LOW (ref 12.0–15.0)
MCH: 29.5 pg (ref 26.0–34.0)
MCHC: 32 g/dL (ref 30.0–36.0)
MCV: 92.4 fL (ref 80.0–100.0)
Platelets: 119 10*3/uL — ABNORMAL LOW (ref 150–400)
RBC: 3.69 MIL/uL — ABNORMAL LOW (ref 3.87–5.11)
RDW: 14.9 % (ref 11.5–15.5)
WBC: 9 10*3/uL (ref 4.0–10.5)
nRBC: 0 % (ref 0.0–0.2)

## 2020-04-24 LAB — PHOSPHORUS: Phosphorus: 1.9 mg/dL — ABNORMAL LOW (ref 2.5–4.6)

## 2020-04-24 LAB — H. PYLORI ANTIBODY, IGG: H Pylori IgG: 0.14 Index Value (ref 0.00–0.79)

## 2020-04-24 LAB — MAGNESIUM: Magnesium: 1.6 mg/dL — ABNORMAL LOW (ref 1.7–2.4)

## 2020-04-24 MED ORDER — KCL IN DEXTROSE-NACL 20-5-0.45 MEQ/L-%-% IV SOLN: INTRAVENOUS | Status: DC

## 2020-04-24 MED ORDER — MAGNESIUM SULFATE 2 GM/50ML IV SOLN
2.0000 g | Freq: Once | INTRAVENOUS | Status: AC
  Administered 2020-04-24: 2 g via INTRAVENOUS
  Filled 2020-04-24: qty 50

## 2020-04-24 MED ORDER — POTASSIUM PHOSPHATES 15 MMOLE/5ML IV SOLN
20.0000 mmol | Freq: Once | INTRAVENOUS | Status: AC
  Administered 2020-04-24: 20 mmol via INTRAVENOUS
  Filled 2020-04-24: qty 6.67

## 2020-04-24 NOTE — Progress Notes (Signed)
Pharmacy Electrolyte Replacement  Recent Labs:  Recent Labs    04/24/20 0442  K 3.5  MG 1.6*  PHOS 1.9*  CREATININE 1.06*    Low Critical Values (K </= 2.5, Phos </= 1, Mg </= 1) Present: Phos = 1.9  A/Plan: 2 Day post op with JP tube and low Phos. MD consulted to replace IV. Will Give 20mmols of KPhos and f/u labs in AM  Thanks for the opportunity to participate in the care of this patient,  Isac Sarna, BS [place], [place] Pharmacist Pager [phone]

## 2020-04-24 NOTE — Telephone Encounter (Signed)
Maria Murphy, please arrange hospital follow-up in 6-8 weeks with me. Thanks!

## 2020-04-24 NOTE — Progress Notes (Signed)
2 Days Post-Op  Subjective: Maria Murphy reports feeling improved today. She has good pain control currently and denies any nausea or vomiting. Has not yet had a bowel movement or flatus. Has had no fever, body aches, or chills.  Objective: Vital signs in last 24 hours: Temp:  [97.6 F (36.4 C)-98.5 F (36.9 C)] 97.6 F (36.4 C) (08/31 2000) Pulse Rate:  [77-115] 88 (09/01 0700) Resp:  [13-29] 26 (09/01 0700) BP: (92-135)/(59-78) 113/66 (09/01 0700) SpO2:  [94 %-98 %] 96 % (09/01 0700) Arterial Line BP: (77-128)/(38-71) 100/59 (09/01 0700)    Intake/Output from previous day: 08/31 0701 - 09/01 0700 In: 4602 [I.V.:2942.5; IV Piggyback:1659.5] Out: 1435 [Urine:1000; Drains:435] Intake/Output this shift: No intake/output data recorded.  Physical Exam Vitals and nursing note reviewed.  Constitutional: no acute distress, resting comfortably in bed Cardiovascular: regular rate and rhythm, normal heart sounds Pulmonary: effort normal, normal breath sounds bilaterally Abdominal: flat, nontender, midline incision without erythema or purulence, small amount of serosanguinous drainage from JP tube. Skin: warm and dry Neurological: alert, no focal deficit  Lab Results:  Recent Labs    04/23/20 0451 04/24/20 0442  WBC 12.4* 9.0  HGB 15.7* 10.9*  HCT 49.3* 34.1*  PLT 166 119*   BMET Recent Labs    04/23/20 0451 04/24/20 0442  NA 139 140  K 4.7 3.5  CL 107 111  CO2 19* 20*  GLUCOSE 137* 84  BUN 32* 24*  CREATININE 1.65* 1.06*  CALCIUM 7.9* 7.4*   Studies/Results: MR 3D Recon At Scanner  Result Date: 04/22/2020 CLINICAL DATA:  Pancreatitis suspected, characterize pancreatic lesions, characterize incidental right renal lesion EXAM: MRI ABDOMEN WITHOUT AND WITH CONTRAST (INCLUDING MRCP) TECHNIQUE: Multiplanar multisequence MR imaging of the abdomen was performed both before and after the administration of intravenous contrast. Heavily T2-weighted images of the biliary and  pancreatic ducts were obtained, and three-dimensional MRCP images were rendered by post processing. CONTRAST:  46mL GADAVIST GADOBUTROL 1 MMOL/ML IV SOLN COMPARISON:  CT abdomen pelvis, 04/20/2020 FINDINGS: Lower chest: Small right pleural effusion associated atelectasis or consolidation. Trace left pleural effusion. Hepatobiliary: No mass or other parenchymal abnormality identified. Gallstones in the gallbladder. No gallbladder wall thickening. No biliary ductal dilatation. Pancreas: There is a multicystic fluid signal lesion in the anterior pancreatic head measuring 4.1 x 2.6 x 3.0 cm (series 5, image 26, series 4, image 12). There are multiple associated small calcifications. This is clearly distinct from the pancreatic duct and there is no pancreatic ductal dilatation. No solid contrast enhancing component. Spleen:  Within normal limits in size and appearance. Adrenals/Urinary Tract: No masses identified. Numerous bilateral renal cysts, predominantly parapelvic although also with several cortical cysts. An exophytic cyst of the superior pole of the right kidney is partially calcified and demonstrates mixed intrinsic signal, but demonstrates no contrast enhancement. Remaining cysts are simple and fluid signal without enhancement. No evidence of hydronephrosis. Stomach/Bowel: There is gastric mucosal thickening of the antrum and pylorus and a possible small ulceration of the anterior gastric antrum (series 5, image 22). Vascular/Lymphatic: No pathologically enlarged lymph nodes identified. No abdominal aortic aneurysm demonstrated. Other:  There is new moderate volume ascites and pneumoperitoneum. Musculoskeletal: No suspicious bone lesions identified. IMPRESSION: 1. There is new moderate volume ascites throughout the abdomen and pneumoperitoneum, concerning for perforated viscus organ. There is gastric mucosal thickening of the antrum and pylorus and a possible small ulceration of the anterior pylorus, suggesting  a perforated gastric ulceration although nidus of perforation is not definitively  localized. 2. There is a multicystic fluid signal lesion in the anterior pancreatic head measuring 4.1 cm. This is clearly distinct from the pancreatic duct and there are multiple associated small calcifications. Favor pancreatic pseudocyst although mucinous cystic neoplasm is a substantial differential consideration given appearance. Given lesion size recommend consideration of EUS and FNA for diagnosis. 3. No pancreatic ductal dilatation. 4. Numerous bilateral renal cysts, predominantly parapelvic although also with several cortical cysts. An exophytic cyst of the superior pole of the right kidney is partially calcified and demonstrates mixed intrinsic signal, but demonstrates no contrast enhancement. This is consistent with a benign proteinaceous or hemorrhagic cyst. 5. Cholelithiasis. No evidence of acute cholecystitis. No biliary ductal dilatation. These results were called by telephone at the time of interpretation on 04/22/2020 at 10:17 am to Dr. Manuella Ghazi, who verbally acknowledged these results. Electronically Signed   By: Eddie Candle M.D.   On: 04/22/2020 10:31   DG Chest Port 1 View  Result Date: 04/22/2020 CLINICAL DATA:  Peri op EXAM: PORTABLE CHEST 1 VIEW COMPARISON:  04/22/2020, CT 04/20/2020, MRI 04/22/2020 FINDINGS: Esophageal tube tip overlies the gastric outlet. New cutaneous staples over the right hemiabdomen. Low lung volumes. Borderline cardiomegaly. Patchy atelectasis at the bases. Resolution of previously noted free air beneath the right diaphragm. No pneumothorax. IMPRESSION: 1. Esophageal tube tip overlies the gastric outlet. 2. Low lung volumes with patchy basilar atelectasis. Resolution of previously noted free air beneath the right diaphragm Electronically Signed   By: Donavan Foil M.D.   On: 04/22/2020 20:08   DG CHEST PORT 1 VIEW  Result Date: 04/22/2020 CLINICAL DATA:  Pneumoperitoneum. EXAM:  PORTABLE CHEST 1 VIEW COMPARISON:  04/22/2020 MR and prior studies FINDINGS: Pneumoperitoneum underlying the RIGHT hemidiaphragm is noted, identified on earlier MRI. An NG tube is present with tip overlying the distal stomach. Bilateral LOWER lung opacities/atelectasis are present. There is no evidence of pneumothorax. IMPRESSION: 1. Pneumoperitoneum underlying the RIGHT hemidiaphragm. 2. Bilateral LOWER lung opacities/atelectasis. Electronically Signed   By: Margarette Canada M.D.   On: 04/22/2020 12:43   MR ABDOMEN MRCP W WO CONTAST  Result Date: 04/22/2020 CLINICAL DATA:  Pancreatitis suspected, characterize pancreatic lesions, characterize incidental right renal lesion EXAM: MRI ABDOMEN WITHOUT AND WITH CONTRAST (INCLUDING MRCP) TECHNIQUE: Multiplanar multisequence MR imaging of the abdomen was performed both before and after the administration of intravenous contrast. Heavily T2-weighted images of the biliary and pancreatic ducts were obtained, and three-dimensional MRCP images were rendered by post processing. CONTRAST:  11mL GADAVIST GADOBUTROL 1 MMOL/ML IV SOLN COMPARISON:  CT abdomen pelvis, 04/20/2020 FINDINGS: Lower chest: Small right pleural effusion associated atelectasis or consolidation. Trace left pleural effusion. Hepatobiliary: No mass or other parenchymal abnormality identified. Gallstones in the gallbladder. No gallbladder wall thickening. No biliary ductal dilatation. Pancreas: There is a multicystic fluid signal lesion in the anterior pancreatic head measuring 4.1 x 2.6 x 3.0 cm (series 5, image 26, series 4, image 12). There are multiple associated small calcifications. This is clearly distinct from the pancreatic duct and there is no pancreatic ductal dilatation. No solid contrast enhancing component. Spleen:  Within normal limits in size and appearance. Adrenals/Urinary Tract: No masses identified. Numerous bilateral renal cysts, predominantly parapelvic although also with several cortical  cysts. An exophytic cyst of the superior pole of the right kidney is partially calcified and demonstrates mixed intrinsic signal, but demonstrates no contrast enhancement. Remaining cysts are simple and fluid signal without enhancement. No evidence of hydronephrosis. Stomach/Bowel: There is gastric mucosal thickening  of the antrum and pylorus and a possible small ulceration of the anterior gastric antrum (series 5, image 22). Vascular/Lymphatic: No pathologically enlarged lymph nodes identified. No abdominal aortic aneurysm demonstrated. Other:  There is new moderate volume ascites and pneumoperitoneum. Musculoskeletal: No suspicious bone lesions identified. IMPRESSION: 1. There is new moderate volume ascites throughout the abdomen and pneumoperitoneum, concerning for perforated viscus organ. There is gastric mucosal thickening of the antrum and pylorus and a possible small ulceration of the anterior pylorus, suggesting a perforated gastric ulceration although nidus of perforation is not definitively localized. 2. There is a multicystic fluid signal lesion in the anterior pancreatic head measuring 4.1 cm. This is clearly distinct from the pancreatic duct and there are multiple associated small calcifications. Favor pancreatic pseudocyst although mucinous cystic neoplasm is a substantial differential consideration given appearance. Given lesion size recommend consideration of EUS and FNA for diagnosis. 3. No pancreatic ductal dilatation. 4. Numerous bilateral renal cysts, predominantly parapelvic although also with several cortical cysts. An exophytic cyst of the superior pole of the right kidney is partially calcified and demonstrates mixed intrinsic signal, but demonstrates no contrast enhancement. This is consistent with a benign proteinaceous or hemorrhagic cyst. 5. Cholelithiasis. No evidence of acute cholecystitis. No biliary ductal dilatation. These results were called by telephone at the time of  interpretation on 04/22/2020 at 10:17 am to Dr. Manuella Ghazi, who verbally acknowledged these results. Electronically Signed   By: Eddie Candle M.D.   On: 04/22/2020 10:31    Anti-infectives: Anti-infectives (From admission, onward)   Start     Dose/Rate Route Frequency Ordered Stop   04/22/20 0100  piperacillin-tazobactam (ZOSYN) IVPB 3.375 g        3.375 g 12.5 mL/hr over 240 Minutes Intravenous Every 8 hours 04/22/20 0002     04/22/20 0000  piperacillin-tazobactam (ZOSYN) IVPB 3.375 g  Status:  Discontinued        3.375 g 100 mL/hr over 30 Minutes Intravenous Every 8 hours 04/21/20 2349 04/22/20 0002      Assessment/Plan: s/p Procedure(s): EXPLORATORY LAPAROTOMY CENTRAL LINE INSERTION GASTRORRHAPHY Maria Murphy is a 64 year old female 2 days post-op from exploratory laparotomy, gastrorrhaphy, and central line insertion. Maria Murphy continues to improve today with regards to her pain. Has yet to have a bowel movement or flatus. She has been afebrile since surgery and leukocytosis improved to 9.0 from 12.4. Mag 1.6 and phos 1.9 today, will replete. Hgb decreased to 10.9 from 13.6 prior to surgery, will continue to monitor. Creatinine returned to baseline today at 1.06 from 1.65 yesterday. Small amount of urine output recorded this AM. Pt is hemodynamically stable off of levophed since yesterday. JP tube draining serosanguinois fluid without signs of biliary drainage. Will order upper GI contrast study tomorrow to assess for leakage at gastrorrhaphy site. -NPO, small sips with meds -D5, 1/2 NS 100 mL/hr -fentanyl injection 25 mcg PRN -Zosyn 3.375g q8h, Day 3 -IV Mag Sulfate 2g -IV K-Phos 20 mmol  LOS: 2 days    Fulton Reek 04/24/2020

## 2020-04-24 NOTE — Progress Notes (Signed)
Bladder scan performed, revealed about 200 mls. urine in bladder.  No urine has been collected by purewick and bed is not wet.

## 2020-04-25 ENCOUNTER — Inpatient Hospital Stay (HOSPITAL_COMMUNITY): Payer: Medicaid Other

## 2020-04-25 LAB — CBC
HCT: 34.3 % — ABNORMAL LOW (ref 36.0–46.0)
Hemoglobin: 11.1 g/dL — ABNORMAL LOW (ref 12.0–15.0)
MCH: 29.4 pg (ref 26.0–34.0)
MCHC: 32.4 g/dL (ref 30.0–36.0)
MCV: 90.7 fL (ref 80.0–100.0)
Platelets: 142 10*3/uL — ABNORMAL LOW (ref 150–400)
RBC: 3.78 MIL/uL — ABNORMAL LOW (ref 3.87–5.11)
RDW: 14.9 % (ref 11.5–15.5)
WBC: 12.8 10*3/uL — ABNORMAL HIGH (ref 4.0–10.5)
nRBC: 0 % (ref 0.0–0.2)

## 2020-04-25 LAB — BASIC METABOLIC PANEL
Anion gap: 7 (ref 5–15)
BUN: 13 mg/dL (ref 8–23)
CO2: 19 mmol/L — ABNORMAL LOW (ref 22–32)
Calcium: 7.5 mg/dL — ABNORMAL LOW (ref 8.9–10.3)
Chloride: 110 mmol/L (ref 98–111)
Creatinine, Ser: 0.77 mg/dL (ref 0.44–1.00)
GFR calc Af Amer: >60 mL/min (ref >60)
GFR calc non Af Amer: >60 mL/min (ref >60)
Glucose, Bld: 161 mg/dL — ABNORMAL HIGH (ref 70–99)
Potassium: 3.7 mmol/L (ref 3.5–5.1)
Sodium: 136 mmol/L (ref 135–145)

## 2020-04-25 LAB — MAGNESIUM: Magnesium: 2.1 mg/dL (ref 1.7–2.4)

## 2020-04-25 LAB — PHOSPHORUS: Phosphorus: 1.7 mg/dL — ABNORMAL LOW (ref 2.5–4.6)

## 2020-04-25 MED ORDER — POTASSIUM PHOSPHATES 15 MMOLE/5ML IV SOLN
20.0000 mmol | Freq: Once | INTRAVENOUS | Status: AC
  Administered 2020-04-25: 20 mmol via INTRAVENOUS
  Filled 2020-04-25: qty 6.67

## 2020-04-25 MED ORDER — IOPAMIDOL (ISOVUE-300) INJECTION 61%
150.0000 mL | Freq: Once | INTRAVENOUS | Status: AC | PRN
  Administered 2020-04-25: 150 mL via ORAL

## 2020-04-25 NOTE — Progress Notes (Signed)
Pharmacy Electrolyte Replacement  Recent Labs:  Recent Labs    04/25/20 0545  K 3.7  MG 2.1  PHOS 1.7*  CREATININE 0.77    Low Critical Values (K </= 2.5, Phos </= 1, Mg </= 1) Present: Phos = 1.9> 1.7  A/Plan: Day 3 post op with JP tube and low Phos still.  MD consulted to replace IV. Will Give an additional  85mmols of KPhos and f/u labs in AM  Thanks for the opportunity to participate in the care of this patient,  Isac Sarna, BS Vena [place], [place] Pharmacist Pager [phone]

## 2020-04-25 NOTE — Evaluation (Signed)
Physical Therapy Evaluation Patient Details Name: Maria Murphy MRN: [id number] DOB: 1956-02-29 Today's Date: 04/25/2020   History of Present Illness  Maria Murphy is a 64 y.o. female s/p  Exploratory laparotomy, gastrorrhaphy [facility] plication), central line placement on 04/22/20 with medical history significant of HTN, decreased memory on medication. Maria Murphy was seen in [facility] last night, 04/20/20 and evaluated for possible code stroke with negative results.  She also reports having had waxing and waning of upper abdominal pain for several days.  She returns to [facility] today for increasing epigastric pain with vomiting that began this morning.  She does admit to drinking more alcohol recently along with taking Aleve for her aches and pains of her joints.  She had a CT scan of her abdomen and pelvis 04/20/20 that revealed cholelithiasis without gallbladder inflammation or biliary dilatation. Coarse calcifications were noted in the pancreatic head consistent with chronic pancreatitis.  There were 3 cystic lesions in the pancreatic head with the largest measuring 2.2 cm.  And she was found to have a bilirubin of 3.    Clinical Impression  Patient has difficulty sitting up at bedside due to c/o increased abdominal pain requiring Mod/max assist to complete supine to sitting, has limited right knee flexion resulting in increased time/labord movement for completing sit to stands and limited to a few steps at bedside due to severe fall risk.  Patient able to transfer to [facility] to have a bowel movement before transferring to chair.  Patient tolerated sitting up in chair after therapy - nursing staff notified.  Patient will benefit from continued physical therapy in hospital and recommended venue below to increase strength, balance, endurance for safe ADLs and gait.     Follow Up Recommendations SNF    Equipment Recommendations  Rolling walker with 5" wheels    Recommendations for Other Services        Precautions / Restrictions Precautions Precautions: Fall Restrictions Weight Bearing Restrictions: No      Mobility  Bed Mobility Overal bed mobility: Needs Assistance Bed Mobility: Supine to Sit     Supine to sit: Mod assist;Max assist     General bed mobility comments: slow labored movement with c/o increased abdominal pain with movement  Transfers Overall transfer level: Needs assistance Equipment used: Rolling walker (2 wheeled) Transfers: Sit to/from [facility] Sit to Stand: Mod assist Stand pivot transfers: Mod assist       General transfer comment: slow labored movement, has difficulty completing sit to stands with RLE due to decreased knee flexion  Ambulation/Gait Ambulation/Gait assistance: Mod assist;Max assist Gait Distance (Feet): 4 Feet Assistive device: Rolling walker (2 wheeled) Gait Pattern/deviations: Decreased step length - right;Decreased step length - left;Decreased stride length Gait velocity: decreased   General Gait Details: limited to 4-5 slow labored shuffling steps with difficulty advancing RLE due to limited knee flexion and generalized weakness  Stairs            Wheelchair Mobility    Modified Rankin (Stroke Patients Only)       Balance Overall balance assessment: Needs assistance Sitting-balance support: Feet supported;No upper extremity supported Sitting balance-Leahy Scale: Fair Sitting balance - Comments: seated at EOB   Standing balance support: During functional activity;Bilateral upper extremity supported Standing balance-Leahy Scale: Poor Standing balance comment: using RW                             Pertinent Vitals/Pain Pain Assessment: 0-10 Pain  Score: 7  Pain Location: abdomen at surgical site Pain Descriptors / Indicators: Sore;Guarding;Grimacing Pain Intervention(s): Limited activity within patient's tolerance;Monitored during session;Repositioned    Home Living  Family/patient expects to be discharged to:: Private residence Living Arrangements: Alone Available Help at Discharge: Friend(s);Available PRN/intermittently Type of Home: Apartment Home Access: Stairs to enter Entrance Stairs-Rails: None Entrance Stairs-Number of Steps: 1 Home Layout: One level Home Equipment: Cane - single point      Prior Function Level of Independence: Independent with assistive device(s)         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SPC, does not drive     Hand Dominance        Extremity/Trunk Assessment   Upper Extremity Assessment Upper Extremity Assessment: Generalized weakness    Lower Extremity Assessment Lower Extremity Assessment: Generalized weakness    Cervical / Trunk Assessment Cervical / Trunk Assessment: Kyphotic  Communication   Communication: No difficulties  Cognition Arousal/Alertness: Awake/alert Behavior During Therapy: WFL for tasks assessed/performed Overall Cognitive Status: Within Functional Limits for tasks assessed                                        General Comments      Exercises     Assessment/Plan    PT Assessment Patient needs continued PT services  PT Problem List Decreased strength;Decreased activity tolerance;Decreased balance;Decreased mobility       PT Treatment Interventions Gait training;Stair training;Functional mobility training;Therapeutic activities;Therapeutic exercise;Patient/family education;Balance training    PT Goals (Current goals can be found in the Care Plan section)  Acute Rehab PT Goals Patient Stated Goal: return home able to take care of self PT Goal Formulation: With patient Time For Goal Achievement: 05/09/20 Potential to Achieve Goals: Good    Frequency Min 4X/week   Barriers to discharge        Co-evaluation               AM-PAC PT "6 Clicks" Mobility  Outcome Measure Help needed turning from your back to your side while in a flat bed  without using bedrails?: A Lot Help needed moving from lying on your back to sitting on the side of a flat bed without using bedrails?: A Lot Help needed moving to and from a bed to a chair (including a wheelchair)?: A Lot Help needed standing up from a chair using your arms (e.g., wheelchair or bedside chair)?: A Lot Help needed to walk in hospital room?: A Lot Help needed climbing 3-5 steps with a railing? : Total 6 Click Score: 11    End of Session   Activity Tolerance: Patient tolerated treatment well;Patient limited by fatigue Patient left: in chair;with call bell/phone within reach Nurse Communication: Mobility status PT Visit Diagnosis: Unsteadiness on feet (R26.81);Other abnormalities of gait and mobility (R26.89);Muscle weakness (generalized) (M62.81)    Time: 1031-1101 PT Time Calculation (min) (ACUTE ONLY): 30 min   Charges:   PT Evaluation $PT Eval Moderate Complexity: 1 Mod PT Treatments $Therapeutic Activity: 23-37 mins        12:28 PM, 04/25/20 Lonell Grandchild, MPT Physical Therapist with [facility] [phone] office [phone] mobile phone

## 2020-04-25 NOTE — Progress Notes (Signed)
A-Line and Femoral Line removed, per order. No issues noted.

## 2020-04-25 NOTE — Progress Notes (Addendum)
3 Days Post-Op  Subjective: Maria Murphy reports improvement in her abdominal cramping and pain this morning. She denies nausea or vomiting and has had no body aches or chills. She has had some ice chips without nausea or abdominal pain. Maria Murphy does report some shortness of breath that started yesterday afternoon. She denies any chest pain, palpitations, leg pain or redness.  Objective: Vital signs in last 24 hours: Temp:  [98.5 F (36.9 C)-99.1 F (37.3 C)] 98.8 F (37.1 C) (09/02 0750) Pulse Rate:  [84-99] 84 (09/02 0750) Resp:  [16-34] 25 (09/02 0750) BP: (122-174)/(66-98) 160/96 (09/02 0730) SpO2:  [96 %-100 %] 97 % (09/02 0750) Arterial Line BP: (106-160)/(68-83) 160/83 (09/02 0400) Weight:  [102 kg] 102 kg (09/02 0424)    Intake/Output from previous day: 09/01 0701 - 09/02 0700 In: 2370.3 [I.V.:1690.3; IV Piggyback:680] Out: 935 [Urine:900; Drains:35] Intake/Output this shift: No intake/output data recorded.  Physical Exam Vitals and nursing note reviewed.  Constitutional: no acute distress, resting comfortably in bed Neck: +JVD to mid-neck at 30 degrees Cardiovascular: regular rate and rhythm, normal heart sounds Pulmonary: effort normal, normal breath sounds anteriorly - unable to auscultate posteriorly due to pt pain with movement Abdominal: flat, nontender, midline incision without erythema or purulence, small amount of serosanguinous drainage from JP tube Musculoskeletal: 1+ pitting edema right lower extremity, trace pitting edema left lower extremity Skin: warm and dry Neurological: alert, no focal deficit  Lab Results:  Recent Labs    04/24/20 0442 04/25/20 0545  WBC 9.0 12.8*  HGB 10.9* 11.1*  HCT 34.1* 34.3*  PLT 119* 142*   BMET Recent Labs    04/24/20 0442 04/25/20 0545  NA 140 136  K 3.5 3.7  CL 111 110  CO2 20* 19*  GLUCOSE 84 161*  BUN 24* 13  CREATININE 1.06* 0.77  CALCIUM 7.4* 7.5*    Anti-infectives: Anti-infectives (From  admission, onward)   Start     Dose/Rate Route Frequency Ordered Stop   04/22/20 0100  piperacillin-tazobactam (ZOSYN) IVPB 3.375 g        3.375 g 12.5 mL/hr over 240 Minutes Intravenous Every 8 hours 04/22/20 0002     04/22/20 0000  piperacillin-tazobactam (ZOSYN) IVPB 3.375 g  Status:  Discontinued        3.375 g 100 mL/hr over 30 Minutes Intravenous Every 8 hours 04/21/20 2349 04/22/20 0002      Assessment/Plan: s/p Procedure(s): EXPLORATORY LAPAROTOMY CENTRAL LINE INSERTION GASTRORRHAPHY Maria Murphy is a 64 year old female 2 days post-op from exploratory laparotomy, gastrorrhaphy, and central line insertion She no longer has crampy pain in her right abdomen but does have pain with movement. This continues to improve. Small amount of serosanguinous fluid in JP tube. Gastrograffin study to be performed today to assure no gastric leakage. She was afebrile overnight and hemodynamically stable since d/c levophed 2 days ago. Mag improved to 2.1. Phosphorous remains low at 1.7, replete per pharmacy. Leukocytosis to 12.8 from 9.0, will continue to watch. Urine output has improved, 900 mL collected this AM and sCr at 0.77. However, shortness of breath, bilateral LE pitting edema, net positive I/O of ~1.5 L, and +JVD along with weight gain since admission day indicate she is likely volume overloaded from fluid resuscitation. Will decrease maintenance fluids to 50 mL/hr and watch for improvement. -NPO pending gastrograffin study -IV D5 1/2 NS 50 mL/hr -fentanyl injection, 25 mcg PRN -Zosyn 3.375g q8H, Day 4 -Phosphorous repletion per pharmacy    LOS: 3 days  Paulino Door Hind Chesler 04/25/2020

## 2020-04-25 NOTE — Plan of Care (Signed)
  Problem: Acute Rehab PT Goals(only PT should resolve) Goal: Pt Will Go Supine/Side To Sit Outcome: Progressing Flowsheets (Taken 04/25/2020 1232) Pt will go Supine/Side to Sit:  with minimal assist  with moderate assist Goal: Patient Will Transfer Sit To/From Stand Outcome: Progressing Flowsheets (Taken 04/25/2020 1232) Patient will transfer sit to/from stand: with minimal assist Goal: Pt Will Transfer Bed To Chair/Chair To Bed Outcome: Progressing Flowsheets (Taken 04/25/2020 1232) Pt will Transfer Bed to Chair/Chair to Bed: with min assist Goal: Pt Will Ambulate Outcome: Progressing Flowsheets (Taken 04/25/2020 1232) Pt will Ambulate:  25 feet  with minimal assist  with moderate assist  with rolling walker   12:32 PM, 04/25/20 Lonell Grandchild, MPT Physical Therapist with [facility] [phone] office [phone] mobile phone

## 2020-04-25 NOTE — Progress Notes (Signed)
Patient called nurse into room and asked to see different nurse upon this RN answering call light. Other nurse entered room to patient complaining that this RN and NT have done nothing to help patient all night. Patient has had several questions throughout shift, all thoroughly answered by this RN. Patient also complained that staff did not perform bladder scan on her which was done at 0000 and that I&O cath was not preformed without doctor's order. This RN did explain to patient at time of bladder scan that doctor's order would be necessary before catheterization could be performed. Patient also states that I&O cath was never performed at all. All questions respectfully answered throughout shift concerning all care and patient has been cared for appropriately with bladder scan and I&O cath performed per protocol.

## 2020-04-26 LAB — CBC
HCT: 35 % — ABNORMAL LOW (ref 36.0–46.0)
Hemoglobin: 11.3 g/dL — ABNORMAL LOW (ref 12.0–15.0)
MCH: 29.4 pg (ref 26.0–34.0)
MCHC: 32.3 g/dL (ref 30.0–36.0)
MCV: 91.1 fL (ref 80.0–100.0)
Platelets: 145 10*3/uL — ABNORMAL LOW (ref 150–400)
RBC: 3.84 MIL/uL — ABNORMAL LOW (ref 3.87–5.11)
RDW: 14.8 % (ref 11.5–15.5)
WBC: 13.2 10*3/uL — ABNORMAL HIGH (ref 4.0–10.5)
nRBC: 0 % (ref 0.0–0.2)

## 2020-04-26 LAB — BASIC METABOLIC PANEL
Anion gap: 7 (ref 5–15)
BUN: 9 mg/dL (ref 8–23)
CO2: 20 mmol/L — ABNORMAL LOW (ref 22–32)
Calcium: 7.8 mg/dL — ABNORMAL LOW (ref 8.9–10.3)
Chloride: 110 mmol/L (ref 98–111)
Creatinine, Ser: 0.65 mg/dL (ref 0.44–1.00)
GFR calc Af Amer: >60 mL/min (ref >60)
GFR calc non Af Amer: >60 mL/min (ref >60)
Glucose, Bld: 131 mg/dL — ABNORMAL HIGH (ref 70–99)
Potassium: 3.4 mmol/L — ABNORMAL LOW (ref 3.5–5.1)
Sodium: 137 mmol/L (ref 135–145)

## 2020-04-26 LAB — AEROBIC CULTURE W GRAM STAIN (SUPERFICIAL SPECIMEN)

## 2020-04-26 LAB — PHOSPHORUS: Phosphorus: 1.9 mg/dL — ABNORMAL LOW (ref 2.5–4.6)

## 2020-04-26 LAB — MAGNESIUM: Magnesium: 2 mg/dL (ref 1.7–2.4)

## 2020-04-26 MED ORDER — PANTOPRAZOLE SODIUM 40 MG PO TBEC
40.0000 mg | DELAYED_RELEASE_TABLET | Freq: Two times a day (BID) | ORAL | Status: DC
  Administered 2020-04-26 – 2020-05-06 (×22): 40 mg via ORAL
  Filled 2020-04-26 (×21): qty 1

## 2020-04-26 MED ORDER — POTASSIUM PHOSPHATES 15 MMOLE/5ML IV SOLN
20.0000 mmol | Freq: Once | INTRAVENOUS | Status: AC
  Administered 2020-04-26: 20 mmol via INTRAVENOUS
  Filled 2020-04-26: qty 6.67

## 2020-04-26 MED ORDER — FLUCONAZOLE IN SODIUM CHLORIDE 400-0.9 MG/200ML-% IV SOLN
400.0000 mg | INTRAVENOUS | Status: AC
  Administered 2020-04-26 – 2020-05-06 (×10): 400 mg via INTRAVENOUS
  Filled 2020-04-26 (×15): qty 200

## 2020-04-26 NOTE — Progress Notes (Signed)
Pharmacy Electrolyte Replacement  Recent Labs:  Recent Labs    04/26/20 0803  K 3.4*  MG 2.0  PHOS 1.9*  CREATININE 0.65    Low Critical Values (K </= 2.5, Phos </= 1, Mg </= 1) Present: Phos = 1.9  A/Plan: Day 3 post op with JP tube and low Phos still.  MD consulted to replace IV. Will Give an additional  58mmols of KPhos and f/u labs in AM  Thanks for the opportunity to participate in the care of this patient,  Margot Ables, PharmD Clinical Pharmacist 04/26/2020 12:01 PM

## 2020-04-26 NOTE — Progress Notes (Signed)
Physical Therapy Treatment Patient Details Name: Malayiah Mcbrayer MRN: [id number] DOB: 1955-11-22 Today's Date: 04/26/2020    History of Present Illness Raianna Slight is a 64 y.o. female s/p  Exploratory laparotomy, gastrorrhaphy [facility] plication), central line placement on 04/22/20 with medical history significant of HTN, decreased memory on medication. Ms Tonkovich was seen in [facility] last night, 04/20/20 and evaluated for possible code stroke with negative results.  She also reports having had waxing and waning of upper abdominal pain for several days.  She returns to [facility] today for increasing epigastric pain with vomiting that began this morning.  She does admit to drinking more alcohol recently along with taking Aleve for her aches and pains of her joints.  She had a CT scan of her abdomen and pelvis 04/20/20 that revealed cholelithiasis without gallbladder inflammation or biliary dilatation. Coarse calcifications were noted in the pancreatic head consistent with chronic pancreatitis.  There were 3 cystic lesions in the pancreatic head with the largest measuring 2.2 cm.  And she was found to have a bilirubin of 3.    PT Comments    Patient requires mod assist to transition to seated EOB with HOB elevated and has c/o increased abdominal pain throughout. She demonstrates good sitting tolerance and balance while seated EOB using unilateral and bilateral UE support intermittently. She requires mod assist, RW, and verbal cueing to transfer to standing. She demonstrates fair standing tolerance with RW and has trunk flexed secondary to pain and weakness. She is limited to a few slow, shuffled, lateral steps at bedside. Patient completes seated exercises EOB within limited range. Patient assisted back into bed at end of session. Patient stated she was cold throughout session and she appeared flush - RN notified. RN was also notified that patient stated L ankle wound was looking a little worse than it was  previously. Patient will benefit from continued physical therapy in hospital and recommended venue below to increase strength, balance, endurance for safe ADLs and gait.   Follow Up Recommendations  SNF     Equipment Recommendations  Rolling walker with 5" wheels    Recommendations for Other Services       Precautions / Restrictions Precautions Precautions: Fall Restrictions Weight Bearing Restrictions: No    Mobility  Bed Mobility Overal bed mobility: Needs Assistance Bed Mobility: Supine to Sit     Supine to sit: Mod assist;HOB elevated     General bed mobility comments: slow labored movement with c/o increased abdominal pain with movement, pull to seated with HOB elevated  Transfers   Equipment used: Rolling walker (2 wheeled) Transfers: Sit to/from Stand Sit to Stand: Mod assist         General transfer comment: slow, labored transition, cueing for glute activation and upright posture  Ambulation/Gait Ambulation/Gait assistance: Mod assist Gait Distance (Feet): 2 Feet Assistive device: Rolling walker (2 wheeled) Gait Pattern/deviations: Decreased step length - right;Decreased step length - left;Shuffle Gait velocity: decreased   General Gait Details: limited to several small, shuffled, lateral steps at bedside with RW   Stairs             Wheelchair Mobility    Modified Rankin (Stroke Patients Only)       Balance Overall balance assessment: Needs assistance Sitting-balance support: Feet supported;No upper extremity supported Sitting balance-Leahy Scale: Fair Sitting balance - Comments: seated at EOB   Standing balance support: During functional activity;Bilateral upper extremity supported Standing balance-Leahy Scale: Poor Standing balance comment: using RW  Cognition Arousal/Alertness: Awake/alert Behavior During Therapy: WFL for tasks assessed/performed Overall Cognitive Status: Within Functional  Limits for tasks assessed                                        Exercises General Exercises - Lower Extremity Long Arc Quad: AROM;Both;10 reps;Seated Hip Flexion/Marching: AROM;10 reps;Both;Seated Toe Raises: AROM;Both;10 reps Heel Raises: AROM;Both;10 reps;Seated    General Comments        Pertinent Vitals/Pain Pain Assessment: 0-10 Pain Score: 8  Pain Location: abdomen at surgical site Pain Descriptors / Indicators: Sore;Guarding;Grimacing Pain Intervention(s): Limited activity within patient's tolerance;Monitored during session;Repositioned;Utilized relaxation techniques    Home Living                      Prior Function            PT Goals (current goals can now be found in the care plan section) Acute Rehab PT Goals Patient Stated Goal: return home able to take care of self PT Goal Formulation: With patient Time For Goal Achievement: 05/09/20 Potential to Achieve Goals: Good Progress towards PT goals: Progressing toward goals    Frequency    Min 4X/week      PT Plan Current plan remains appropriate    Co-evaluation              AM-PAC PT "6 Clicks" Mobility   Outcome Measure  Help needed turning from your back to your side while in a flat bed without using bedrails?: A Lot Help needed moving from lying on your back to sitting on the side of a flat bed without using bedrails?: A Lot Help needed moving to and from a bed to a chair (including a wheelchair)?: A Lot Help needed standing up from a chair using your arms (e.g., wheelchair or bedside chair)?: A Lot Help needed to walk in hospital room?: A Lot Help needed climbing 3-5 steps with a railing? : Total 6 Click Score: 11    End of Session Equipment Utilized During Treatment: Gait belt Activity Tolerance: Patient tolerated treatment well;Patient limited by fatigue Patient left: in bed;with bed alarm set;with call bell/phone within reach Nurse Communication: Mobility  status;Other (comment) (LLE wound) PT Visit Diagnosis: Unsteadiness on feet (R26.81);Other abnormalities of gait and mobility (R26.89);Muscle weakness (generalized) (M62.81)     Time: 2035-5974 PT Time Calculation (min) (ACUTE ONLY): 25 min  Charges:  $Therapeutic Exercise: 8-22 mins $Therapeutic Activity: 8-22 mins                    2:51 PM, 04/26/20 Mearl Latin PT, DPT Physical Therapist at [facility]

## 2020-04-26 NOTE — Progress Notes (Addendum)
4 Days Post-Op  Subjective: Maria. Maria Murphy reports having well controlled pain. She only feels some occasional crampy pain in her lower abdomen. Has passed gas and had a small loose bowel movement today. No nausea or vomiting. Reports having some ginger ale since her upper GI study yesterday without any nausea or abdominal pain/pressure. She reports that her shortness of breath worsened last night but this morning is improved and better than yesterday when it started.  Objective: Vital signs in last 24 hours: Temp:  [98.8 F (37.1 C)-99.9 F (37.7 C)] 99.5 F (37.5 C) (09/03 0500) Pulse Rate:  [75-97] 76 (09/03 0500) Resp:  [15-30] 16 (09/03 0500) BP: (150-177)/(79-99) 150/82 (09/03 0500) SpO2:  [93 %-97 %] 96 % (09/03 0500) Last BM Date: 04/25/20  Intake/Output from previous day: 09/02 0701 - 09/03 0700 In: 2219.1 [P.O.:240; I.V.:1322.2; IV Piggyback:656.9] Out: 592 [Urine:200; Drains:390; Stool:2] Intake/Output this shift: No intake/output data recorded.  Physical Exam Vitals and nursing note reviewed.  Constitutional: no acute distress, resting comfortably in bed Neck: JVP disappears with inspiration Cardiovascular: regular rate and rhythm, normal heart sounds Pulmonary: effort normal, normal breath sounds bilaterally Abdominal: flat, nontender,NABS, midline incision without erythema or purulence, small amount of serosanguinous drainage from JP tube Musculoskeletal: trace bilateral lower extremity edema Skin: warm and dry Neurological: alert, no focal deficit  Lab Results:  Recent Labs    04/25/20 0545 04/26/20 0803  WBC 12.8* 13.2*  HGB 11.1* 11.3*  HCT 34.3* 35.0*  PLT 142* 145*   BMET Recent Labs    04/25/20 0545 04/26/20 0803  NA 136 137  K 3.7 3.4*  CL 110 110  CO2 19* 20*  GLUCOSE 161* 131*  BUN 13 9  CREATININE 0.77 0.65  CALCIUM 7.5* 7.8*   PT/INR No results for input(s): LABPROT, INR in the last 72 hours.  Studies/Results: DG UGI W SINGLE CM  (SOL OR THIN BA)  Result Date: 04/25/2020 CLINICAL DATA:  Perforated prepyloric ulcer due to naproxen, post repair EXAM: WATER SOLUBLE UPPER GI SERIES TECHNIQUE: Single-column upper GI series was performed using water soluble contrast. CONTRAST:  167mL ISOVUE-300 IOPAMIDOL (ISOVUE-300) INJECTION 61% orally COMPARISON:  CT abdomen and pelvis 04/20/2020 FLUOROSCOPY TIME:  Fluoroscopy Time:  1 minutes 18 seconds Radiation Exposure Index (if provided by the fluoroscopic device): 49.5 mGy Number of Acquired Spot Images: 8 + multiple fluoroscopic screen captures FINDINGS: Scout image demonstrates tip of nasogastric tube projecting over stomach. JP drain projects over duodenal bulb, pylorus, and prepyloric region. Skin clips from upper abdominal laparotomy. Gaseous distention of stomach. Contrast opacifies the gastric lumen and with patient turned to RIGHT lateral decubitus position, empties from the stomach into duodenum. No evidence of gastric outlet obstruction. Mild bowel wall thickening of the pylorus, duodenal bulb and pre pyloric mucosa. No extraluminal contrast extravasation identified. No contrast is seen along the JP drain. IMPRESSION: Edema at the surgical region at the pylorus, pre pyloric, and duodenal bulb regions. No evidence of contrast extravasation. Electronically Signed   By: Lavonia Dana M.D.   On: 04/25/2020 10:11    Anti-infectives: Anti-infectives (From admission, onward)   Start     Dose/Rate Route Frequency Ordered Stop   04/26/20 1000  fluconazole (DIFLUCAN) IVPB 400 mg        400 mg 100 mL/hr over 120 Minutes Intravenous Every 24 hours 04/26/20 0907     04/22/20 0100  piperacillin-tazobactam (ZOSYN) IVPB 3.375 g        3.375 g 12.5 mL/hr over 240 Minutes  Intravenous Every 8 hours 04/22/20 0002     04/22/20 0000  piperacillin-tazobactam (ZOSYN) IVPB 3.375 g  Status:  Discontinued        3.375 g 100 mL/hr over 30 Minutes Intravenous Every 8 hours 04/21/20 2349 04/22/20 0002       Assessment/Plan: s/p Procedure(s): EXPLORATORY LAPAROTOMY CENTRAL LINE INSERTION GASTRORRHAPHY Maria Murphy is a 64 year old female 4 days post-op from exploratory laparotomy, gastrorrhaphy, and central line insertion. Maria Murphy continues to do well. She has well controlled pain. Upper GI study yesterday did not show any gastric leakage so diet has been advanced. Tolerated clear liquids well thus far, will trial soft diet today. She has passed gas and had a BM today. Pt hemodynamically stable, will d/c central line as no further need for levophed. Hct stable at 35.0. Phosphorous 1.9 from 1.7 yesterday, will continue to monitor and replete per pharmacy. K slightly low at 3.4, repleting as needed. Mild leukocytosis to 13.2 but has been afebrile with minimal pain and clean, well healing incision sites. Intraabdominal culture showed rare enterococcus species and yeast. Will continue Zosyn and start diflucan. -soft diet -OOB -d/c NG tube -IV D5 1.2 NS with KCl 20 mEq/L 50 mL/hr -Zosyn 3.375g q8h, Day 5 -Diflucan per pharmacy -Pantoprazole 40 mg BID    LOS: 4 days    Fulton Reek 04/26/2020

## 2020-04-26 NOTE — Progress Notes (Signed)
Pharmacy Antibiotic Note  Maria Murphy is a 64 y.o. female admitted on 04/21/2020 with intra-abdominal infection.  Pharmacy has been consulted for fluconazole dosing.  Plan: Fluconazole 400 mg IV every 24 hours. Monitor labs, c/s, and patient improvement.  Height: 5\' 4"  (162.6 cm) Weight: 102 kg (224 lb 13.9 oz) IBW/kg (Calculated) : 54.7  Temp (24hrs), Avg:99.3 F (37.4 C), Min:98.8 F (37.1 C), Max:99.9 F (37.7 C)  Recent Labs  Lab 04/20/20 2022 04/20/20 2022 04/21/20 1835 04/22/20 0629 04/23/20 0451 04/24/20 0442 04/25/20 0545 04/26/20 0803  WBC 7.5   < > 9.3  --  12.4* 9.0 12.8* 13.2*  CREATININE 1.31*   < > 1.03* 0.98 1.65* 1.06* 0.77  --   LATICACIDVEN 1.4  --   --   --   --   --   --   --    < > = values in this interval not displayed.    Estimated Creatinine Clearance: 83.6 mL/min (by C-G formula based on SCr of 0.77 mg/dL).    Allergies  Allergen Reactions  . Wheat Extract Swelling    Antimicrobials this admission: Fluconazole 9/3 >>  Zosyn 8/30 >>   Dose adjustments this admission: N/A  Microbiology results: IA fluid Cx: rare yeast and enterococcus gallinarum 8/30 MRSA PCR: neg  Thank you for allowing pharmacy to be a part of this patient's care.  Margot Ables, PharmD Clinical Pharmacist 04/26/2020 10:36 AM

## 2020-04-27 LAB — PHOSPHORUS: Phosphorus: 2.8 mg/dL (ref 2.5–4.6)

## 2020-04-27 MED ORDER — OXYCODONE HCL 5 MG PO TABS
5.0000 mg | ORAL_TABLET | ORAL | Status: DC | PRN
  Administered 2020-04-27 – 2020-05-06 (×29): 5 mg via ORAL
  Filled 2020-04-27 (×30): qty 1

## 2020-04-27 MED ORDER — GERHARDT'S BUTT CREAM
TOPICAL_CREAM | Freq: Every day | CUTANEOUS | Status: DC
  Filled 2020-04-27 (×2): qty 1

## 2020-04-27 NOTE — Progress Notes (Signed)
[facility] Progress Note  5 Days Post-Op  Subjective: PT recommending SNF. Tolerating a diet and had BM. Feel ok she says. JP with serous output.   Objective: Vital signs in last 24 hours: Temp:  [98.8 F (37.1 C)-100.5 F (38.1 C)] 98.8 F (37.1 C) (09/04 0656) Pulse Rate:  [71-83] 73 (09/04 0656) Resp:  [16-18] 18 (09/04 0656) BP: (138-160)/(71-88) 160/87 (09/04 0656) SpO2:  [95 %-97 %] 97 % (09/04 0656) Last BM Date: 04/26/20  Intake/Output from previous day: 09/03 0701 - 09/04 0700 In: 1415.9 [P.O.:240; I.V.:651.8; IV Piggyback:524.1] Out: 385 [Drains:385] Intake/Output this shift: Total I/O In: -  Out: 560 [Urine:450; Drains:110]  General appearance: alert, cooperative and no distress Resp: normal work of breathing GI: soft, nondistended, appropriately tender, honeycomb with no drainage or erythema, JP with serous output  Lab Results:  Recent Labs    04/25/20 0545 04/26/20 0803  WBC 12.8* 13.2*  HGB 11.1* 11.3*  HCT 34.3* 35.0*  PLT 142* 145*   BMET Recent Labs    04/25/20 0545 04/26/20 0803  NA 136 137  K 3.7 3.4*  CL 110 110  CO2 19* 20*  GLUCOSE 161* 131*  BUN 13 9  CREATININE 0.77 0.65  CALCIUM 7.5* 7.8*   Anti-infectives: Anti-infectives (From admission, onward)   Start     Dose/Rate Route Frequency Ordered Stop   04/26/20 1000  fluconazole (DIFLUCAN) IVPB 400 mg        400 mg 100 mL/hr over 120 Minutes Intravenous Every 24 hours 04/26/20 0907     04/22/20 0100  piperacillin-tazobactam (ZOSYN) IVPB 3.375 g        3.375 g 12.5 mL/hr over 240 Minutes Intravenous Every 8 hours 04/22/20 0002     04/22/20 0000  piperacillin-tazobactam (ZOSYN) IVPB 3.375 g  Status:  Discontinued        3.375 g 100 mL/hr over 30 Minutes Intravenous Every 8 hours 04/21/20 2349 04/22/20 0002      Assessment/Plan: Ms. Bristol is a 64 yo s/p Ex lap, gastrorrhaphy ([facility] plication) doing well and tolerating a diet.   Added Roxicodone for  pain, fentanyl for severe pain IS, OOB to chair if possible HD ok Soft diet, JP with serous output, will keep in place for now until d/c 385 out today Labs in AM Zosyn and diflucan for contamination post op UOP adequate SCDs, lovenox  SNF placement, social work consult placed    LOS: 5 days    Virl Cagey 04/27/2020

## 2020-04-28 LAB — BASIC METABOLIC PANEL
Anion gap: 9 (ref 5–15)
BUN: 6 mg/dL — ABNORMAL LOW (ref 8–23)
CO2: 21 mmol/L — ABNORMAL LOW (ref 22–32)
Calcium: 7.7 mg/dL — ABNORMAL LOW (ref 8.9–10.3)
Chloride: 104 mmol/L (ref 98–111)
Creatinine, Ser: 0.64 mg/dL (ref 0.44–1.00)
GFR calc Af Amer: >60 mL/min (ref >60)
GFR calc non Af Amer: >60 mL/min (ref >60)
Glucose, Bld: 127 mg/dL — ABNORMAL HIGH (ref 70–99)
Potassium: 3 mmol/L — ABNORMAL LOW (ref 3.5–5.1)
Sodium: 134 mmol/L — ABNORMAL LOW (ref 135–145)

## 2020-04-28 LAB — CBC WITH DIFFERENTIAL/PLATELET
Abs Immature Granulocytes: 0.58 10*3/uL — ABNORMAL HIGH (ref 0.00–0.07)
Basophils Absolute: 0 10*3/uL (ref 0.0–0.1)
Basophils Relative: 0 %
Eosinophils Absolute: 0.2 10*3/uL (ref 0.0–0.5)
Eosinophils Relative: 1 %
HCT: 34.3 % — ABNORMAL LOW (ref 36.0–46.0)
Hemoglobin: 11.2 g/dL — ABNORMAL LOW (ref 12.0–15.0)
Immature Granulocytes: 4 %
Lymphocytes Relative: 8 %
Lymphs Abs: 1.1 10*3/uL (ref 0.7–4.0)
MCH: 29.1 pg (ref 26.0–34.0)
MCHC: 32.7 g/dL (ref 30.0–36.0)
MCV: 89.1 fL (ref 80.0–100.0)
Monocytes Absolute: 1.7 10*3/uL — ABNORMAL HIGH (ref 0.1–1.0)
Monocytes Relative: 12 %
Neutro Abs: 10.2 10*3/uL — ABNORMAL HIGH (ref 1.7–7.7)
Neutrophils Relative %: 75 %
Platelets: 167 10*3/uL (ref 150–400)
RBC: 3.85 MIL/uL — ABNORMAL LOW (ref 3.87–5.11)
RDW: 14.6 % (ref 11.5–15.5)
WBC: 13.7 10*3/uL — ABNORMAL HIGH (ref 4.0–10.5)
nRBC: 0 % (ref 0.0–0.2)

## 2020-04-28 LAB — SARS CORONAVIRUS 2 BY RT PCR (HOSPITAL ORDER, PERFORMED IN ~~LOC~~ HOSPITAL LAB): SARS Coronavirus 2: NEGATIVE

## 2020-04-28 NOTE — Progress Notes (Signed)
6 Days Post-Op  Subjective: No complaints of pain.  Still gets winded with physical therapy.  Objective: Vital signs in last 24 hours: Temp:  [98.8 F (37.1 C)-99.1 F (37.3 C)] 98.8 F (37.1 C) (09/05 0512) Pulse Rate:  [77-79] 78 (09/05 0512) Resp:  [18-20] 18 (09/05 0001) BP: (149-154)/(76-82) 149/82 (09/05 0512) SpO2:  [95 %-97 %] 95 % (09/05 0512) Last BM Date: 04/27/20  Intake/Output from previous day: 09/04 0701 - 09/05 0700 In: 1964.5 [P.O.:960; I.V.:658.1; IV Piggyback:346.4] Out: 2230 [Urine:1900; Drains:330] Intake/Output this shift: Total I/O In: 320 [P.O.:320] Out: 850 [Urine:850]  General appearance: alert, cooperative and no distress Resp: clear to auscultation bilaterally Cardio: regular rate and rhythm, S1, S2 normal, no murmur, click, rub or gallop GI: Soft, incision healing well.  JP drainage serosanguineous.  Lab Results:  Recent Labs    04/26/20 0803 04/28/20 0705  WBC 13.2* 13.7*  HGB 11.3* 11.2*  HCT 35.0* 34.3*  PLT 145* 167   BMET Recent Labs    04/26/20 0803 04/28/20 0705  NA 137 134*  K 3.4* 3.0*  CL 110 104  CO2 20* 21*  GLUCOSE 131* 127*  BUN 9 6*  CREATININE 0.65 0.64  CALCIUM 7.8* 7.7*   PT/INR No results for input(s): LABPROT, INR in the last 72 hours.  Studies/Results: No results found.  Anti-infectives: Anti-infectives (From admission, onward)   Start     Dose/Rate Route Frequency Ordered Stop   04/26/20 1000  fluconazole (DIFLUCAN) IVPB 400 mg        400 mg 100 mL/hr over 120 Minutes Intravenous Every 24 hours 04/26/20 0907     04/22/20 0100  piperacillin-tazobactam (ZOSYN) IVPB 3.375 g        3.375 g 12.5 mL/hr over 240 Minutes Intravenous Every 8 hours 04/22/20 0002     04/22/20 0000  piperacillin-tazobactam (ZOSYN) IVPB 3.375 g  Status:  Discontinued        3.375 g 100 mL/hr over 30 Minutes Intravenous Every 8 hours 04/21/20 2349 04/22/20 0002      Assessment/Plan: s/p Procedure(s): EXPLORATORY  LAPAROTOMY CENTRAL LINE INSERTION GASTRORRHAPHY Impression: Continues to progress well.  Awaiting skilled nursing placement.  Continue Zosyn and Diflucan.  LOS: 6 days    Aviva Signs 04/28/2020

## 2020-04-29 MED ORDER — ALBUTEROL SULFATE (2.5 MG/3ML) 0.083% IN NEBU
2.5000 mg | INHALATION_SOLUTION | Freq: Four times a day (QID) | RESPIRATORY_TRACT | Status: DC | PRN
  Administered 2020-04-29 – 2020-04-30 (×2): 2.5 mg via RESPIRATORY_TRACT
  Filled 2020-04-29 (×2): qty 3

## 2020-04-29 NOTE — Progress Notes (Signed)
Patient with some wheezing RN and respiratory reporting. Albuterol neb ordered and CXR for AM.  Curlene Labrum, MD
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
## 2020-04-29 NOTE — Progress Notes (Signed)
Pharmacy Antibiotic Note  Maria Murphy is a 64 y.o. female admitted on 04/21/2020 with intra-abdominal infection.  Pharmacy has been consulted for fluconazole dosing.  Plan: Fluconazole 400 mg IV every 24 hours. Monitor labs, c/s, and patient improvement.  Height: 5\' 4"  (162.6 cm) Weight: 102 kg (224 lb 13.9 oz) IBW/kg (Calculated) : 54.7  Temp (24hrs), Avg:99.3 F (37.4 C), Min:98.5 F (36.9 C), Max:100.6 F (38.1 C)  Recent Labs  Lab 04/23/20 0451 04/24/20 0442 04/25/20 0545 04/26/20 0803 04/28/20 0705  WBC 12.4* 9.0 12.8* 13.2* 13.7*  CREATININE 1.65* 1.06* 0.77 0.65 0.64    Estimated Creatinine Clearance: 83.6 mL/min (by C-G formula based on SCr of 0.64 mg/dL).    Allergies  Allergen Reactions  . Wheat Extract Swelling    Antimicrobials this admission: Fluconazole 9/3 >>  Zosyn 8/30 >>   Dose adjustments this admission: N/A  Microbiology results: IA fluid Cx: rare yeast and enterococcus gallinarum 8/30 MRSA PCR: neg  Thank you for allowing pharmacy to be a part of this patient's care.  Thomasenia Sales, PharmD, MBA, BCGP Clinical Pharmacist  04/29/2020 7:52 AM

## 2020-04-29 NOTE — Progress Notes (Signed)
I assessed patient earlier at RN's request.  RN stated that she believed that patient was wheezing.  Went in and you could hear audible wheezing, but it was not very loud.  Listed to BS and there was some expiratory wheeze, but patient seemed pretty diminished.  Had RN call doctor for medication.  Also asked patient to continue to do her IS and to take nice deep breaths.  We talked about the fact that she was hurting in her abdomen, but I told patient even though I knew she was in pain, she still needed to do her IS and taking nice deep breaths.  Gave patient her Albuterol PRN treatment.  Will continue to monitor patient.

## 2020-04-29 NOTE — Progress Notes (Signed)
Physical Therapy Treatment Patient Details Name: Eliyanah Elgersma MRN: [id number] DOB: 09/15/1955 Today's Date: 04/29/2020    History of Present Illness Arzu Mcgaughey is a 64 y.o. female s/p  Exploratory laparotomy, gastrorrhaphy Phillip Heal plication), central line placement on 04/22/20 with medical history significant of HTN, decreased memory on medication. Ms Palecek was seen in [facility] last night, 04/20/20 and evaluated for possible code stroke with negative results.  She also reports having had waxing and waning of upper abdominal pain for several days.  She returns to [facility] today for increasing epigastric pain with vomiting that began this morning.  She does admit to drinking more alcohol recently along with taking Aleve for her aches and pains of her joints.  She had a CT scan of her abdomen and pelvis 04/20/20 that revealed cholelithiasis without gallbladder inflammation or biliary dilatation. Coarse calcifications were noted in the pancreatic head consistent with chronic pancreatitis.  There were 3 cystic lesions in the pancreatic head with the largest measuring 2.2 cm.  And she was found to have a bilirubin of 3.    PT Comments    Patient requires mod assist to pull to seated EOB. She is limited by pain throughout session but is agreeable to continue to participate. Patient completes seated exercises showing fair/good sitting balance. She fatigues quickly with sitting today. She performs lateral scoots in bed without physical assist and requires assist for LE movement back into bed. Patient instructed and able to perform supine exercises with moderate increase in symptoms. Patient educated on continuing to complete while in hospital for improved LE circulation. Patient limited by pain and fatigue today. Patient will benefit from continued physical therapy in hospital and recommended venue below to increase strength, balance, endurance for safe ADLs and gait.    Follow Up Recommendations  SNF      Equipment Recommendations  Rolling walker with 5" wheels    Recommendations for Other Services       Precautions / Restrictions Precautions Precautions: Fall Restrictions Weight Bearing Restrictions: No    Mobility  Bed Mobility Overal bed mobility: Needs Assistance Bed Mobility: Supine to Sit;Sit to Supine     Supine to sit: Mod assist;HOB elevated Sit to supine: Mod assist   General bed mobility comments: slow labored movement with c/o increased abdominal pain with movement, pull to seated with HOB elevated  Transfers Overall transfer level: Needs assistance   Transfers: Lateral/Scoot Transfers          Lateral/Scoot Transfers: Min guard General transfer comment: lateral scooting EOB without physical assist  Ambulation/Gait                 Stairs             Wheelchair Mobility    Modified Rankin (Stroke Patients Only)       Balance Overall balance assessment: Needs assistance Sitting-balance support: Feet supported;No upper extremity supported Sitting balance-Leahy Scale: Fair Sitting balance - Comments: fair/good seated EOB                                    Cognition Arousal/Alertness: Awake/alert Behavior During Therapy: WFL for tasks assessed/performed Overall Cognitive Status: Within Functional Limits for tasks assessed  Exercises General Exercises - Lower Extremity Ankle Circles/Pumps: AROM;Both;5 reps;Seated Quad Sets: AROM;Both;5 reps;Supine Long Arc Quad: AROM;Both;Seated;5 reps Heel Slides: AROM;Both;5 reps;Supine Hip Flexion/Marching: AROM;Both;Seated;5 reps Toe Raises: AROM;Both;10 reps;Seated Heel Raises: AROM;Both;10 reps;Seated    General Comments        Pertinent Vitals/Pain Pain Assessment: 0-10 Pain Location: abdomen at surgical site Pain Descriptors / Indicators: Sore;Guarding;Grimacing Pain Intervention(s): Limited activity within  patient's tolerance;Monitored during session;Repositioned;Utilized relaxation techniques    Home Living                      Prior Function            PT Goals (current goals can now be found in the care plan section) Acute Rehab PT Goals Patient Stated Goal: return home able to take care of self PT Goal Formulation: With patient Time For Goal Achievement: 05/09/20 Potential to Achieve Goals: Good Progress towards PT goals: Progressing toward goals    Frequency    Min 4X/week      PT Plan Current plan remains appropriate    Co-evaluation              AM-PAC PT "6 Clicks" Mobility   Outcome Measure  Help needed turning from your back to your side while in a flat bed without using bedrails?: A Lot Help needed moving from lying on your back to sitting on the side of a flat bed without using bedrails?: A Lot Help needed moving to and from a bed to a chair (including a wheelchair)?: A Lot Help needed standing up from a chair using your arms (e.g., wheelchair or bedside chair)?: A Lot Help needed to walk in hospital room?: A Lot Help needed climbing 3-5 steps with a railing? : Total 6 Click Score: 11    End of Session   Activity Tolerance: Patient limited by fatigue;Patient limited by pain Patient left: in bed;with bed alarm set;with call bell/phone within reach Nurse Communication: Mobility status PT Visit Diagnosis: Unsteadiness on feet (R26.81);Other abnormalities of gait and mobility (R26.89);Muscle weakness (generalized) (M62.81)     Time: 8341-9622 PT Time Calculation (min) (ACUTE ONLY): 10 min  Charges:  $Therapeutic Exercise: 8-22 mins                     3:02 PM, 04/29/20 Mearl Latin PT, DPT Physical Therapist at [facility]

## 2020-04-29 NOTE — Progress Notes (Signed)
[facility] Progress Note  7 Days Post-Op  Subjective: No placement available today. Complaining of soreness after sitting yesterday. Feels weak. Some upper respiratory sounds. Up in chair now. Very overnight but resolved this AM, likely ateletasis.   Objective: Vital signs in last 24 hours: Temp:  [98.5 F (36.9 C)-100.6 F (38.1 C)] 98.8 F (37.1 C) (09/06 0554) Pulse Rate:  [83-93] 83 (09/06 0554) Resp:  [20] 20 (09/06 0554) BP: (153-162)/(81-84) 153/84 (09/06 0554) SpO2:  [93 %-98 %] 93 % (09/06 0554) Last BM Date: 04/28/20  Intake/Output from previous day: 09/05 0701 - 09/06 0700 In: 1610.4 [P.O.:1260; I.V.:350.4] Out: 2580 [Urine:2300; Drains:280] Intake/Output this shift: No intake/output data recorded.  General appearance: alert, cooperative and no distress Resp: normal work of breathing GI: soft, nondistended, JP with light SS output, midline with honeycomb no erythema or drainage, appropriately tender  Lab Results:  Recent Labs    04/28/20 0705  WBC 13.7*  HGB 11.2*  HCT 34.3*  PLT 167   BMET Recent Labs    04/28/20 0705  NA 134*  K 3.0*  CL 104  CO2 21*  GLUCOSE 127*  BUN 6*  CREATININE 0.64  CALCIUM 7.7*    Anti-infectives: Anti-infectives (From admission, onward)   Start     Dose/Rate Route Frequency Ordered Stop   04/26/20 1000  fluconazole (DIFLUCAN) IVPB 400 mg        400 mg 100 mL/hr over 120 Minutes Intravenous Every 24 hours 04/26/20 0907     04/22/20 0100  piperacillin-tazobactam (ZOSYN) IVPB 3.375 g        3.375 g 12.5 mL/hr over 240 Minutes Intravenous Every 8 hours 04/22/20 0002     04/22/20 0000  piperacillin-tazobactam (ZOSYN) IVPB 3.375 g  Status:  Discontinued        3.375 g 100 mL/hr over 30 Minutes Intravenous Every 8 hours 04/21/20 2349 04/22/20 0002      Assessment/Plan: Ms. Hayashida is a 64 yo s/p Ex lap, gastrorrhaphy (graham plication) doing well and tolerating a diet.   Encouraged use of PRN if  needed IS, OOB, up in chair Soft diet, JP out at dc Zosyn and diflucan post op, can d/c on discharge as would have received sufficient dosing post op UOP good SCDs, lovenox SNF placement pending bed, repeat COVID negative, may need another if > 48 hrs since that test when she gets bed    LOS: 7 days    Virl Cagey 04/29/2020
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
## 2020-04-30 ENCOUNTER — Inpatient Hospital Stay (HOSPITAL_COMMUNITY): Payer: Medicaid Other

## 2020-04-30 LAB — CBC
HCT: 37.1 % (ref 36.0–46.0)
Hemoglobin: 12.2 g/dL (ref 12.0–15.0)
MCH: 29.5 pg (ref 26.0–34.0)
MCHC: 32.9 g/dL (ref 30.0–36.0)
MCV: 89.8 fL (ref 80.0–100.0)
Platelets: 200 10*3/uL (ref 150–400)
RBC: 4.13 MIL/uL (ref 3.87–5.11)
RDW: 14.7 % (ref 11.5–15.5)
WBC: 19.5 10*3/uL — ABNORMAL HIGH (ref 4.0–10.5)
nRBC: 0 % (ref 0.0–0.2)

## 2020-04-30 LAB — BASIC METABOLIC PANEL
Anion gap: 11 (ref 5–15)
BUN: 5 mg/dL — ABNORMAL LOW (ref 8–23)
CO2: 26 mmol/L (ref 22–32)
Calcium: 8.2 mg/dL — ABNORMAL LOW (ref 8.9–10.3)
Chloride: 99 mmol/L (ref 98–111)
Creatinine, Ser: 0.71 mg/dL (ref 0.44–1.00)
GFR calc Af Amer: >60 mL/min (ref >60)
GFR calc non Af Amer: >60 mL/min (ref >60)
Glucose, Bld: 144 mg/dL — ABNORMAL HIGH (ref 70–99)
Potassium: 3.4 mmol/L — ABNORMAL LOW (ref 3.5–5.1)
Sodium: 136 mmol/L (ref 135–145)

## 2020-04-30 MED ORDER — ALBUTEROL SULFATE (2.5 MG/3ML) 0.083% IN NEBU
2.5000 mg | INHALATION_SOLUTION | RESPIRATORY_TRACT | Status: DC | PRN
  Administered 2020-04-30: 2.5 mg via RESPIRATORY_TRACT
  Filled 2020-04-30: qty 3

## 2020-04-30 MED ORDER — SODIUM CHLORIDE 0.9 % IV SOLN
INTRAVENOUS | Status: DC | PRN
  Administered 2020-04-30 – 2020-05-06 (×2): 500 mL via INTRAVENOUS
  Administered 2020-05-07: 1000 mL via INTRAVENOUS

## 2020-04-30 MED ORDER — IOHEXOL 300 MG/ML  SOLN
100.0000 mL | Freq: Once | INTRAMUSCULAR | Status: AC | PRN
  Administered 2020-04-30: 100 mL via INTRAVENOUS

## 2020-04-30 NOTE — Progress Notes (Signed)
Pharmacy Antibiotic Note  Maria Murphy is a 64 y.o. female admitted on 04/21/2020 with intra-abdominal infection.  Pharmacy has been consulted for fluconazole dosing. Patient is improving, afebrile.   Plan: Continue Fluconazole 400 mg IV every 24 hours. Continue zosyn 3.375 g IV q8h for EID Monitor labs, c/s, and patient improvement. F/U transition to po tx  Height: 5\' 4"  (162.6 cm) Weight: 102 kg (224 lb 13.9 oz) IBW/kg (Calculated) : 54.7  Temp (24hrs), Avg:99.2 F (37.3 C), Min:99.2 F (37.3 C), Max:99.2 F (37.3 C)  Recent Labs  Lab 04/24/20 0442 04/25/20 0545 04/26/20 0803 04/28/20 0705  WBC 9.0 12.8* 13.2* 13.7*  CREATININE 1.06* 0.77 0.65 0.64    Estimated Creatinine Clearance: 83.6 mL/min (by C-G formula based on SCr of 0.64 mg/dL).    Allergies  Allergen Reactions   Wheat Extract Swelling    Antimicrobials this admission: Fluconazole 9/3 >>  Zosyn 8/30 >>   Dose adjustments this admission: N/A  Microbiology results: 8/30 Iintra-Abdominal fluid Cx: candida albicans  rare enterococcus gallinarum: s-amp, linezolid R- Vanc 8/30 MRSA PCR: neg Thank you for allowing pharmacy to be a part of this patients care.  Isac Sarna, BS Pharm D, [place] Clinical Pharmacist Pager [phone] 04/30/2020 9:14 AM

## 2020-04-30 NOTE — Progress Notes (Signed)
8 Days Post-Op  Subjective: Patient comfortable this morning.  He denies any significant incisional pain.  States her breathing is okay.  Objective: Vital signs in last 24 hours: Temp:  [99.2 F (37.3 C)] 99.2 F (37.3 C) (09/07 0529) Pulse Rate:  [87-102] 102 (09/07 0529) Resp:  [15-16] 15 (09/07 0529) BP: (150-159)/(88-95) 150/88 (09/07 0529) SpO2:  [91 %-94 %] 94 % (09/07 0529) Last BM Date: 04/28/20  Intake/Output from previous day: 09/06 0701 - 09/07 0700 In: -  Out: 755 [Urine:600; Drains:155] Intake/Output this shift: No intake/output data recorded.  General appearance: alert, cooperative and no distress Resp: clear to auscultation bilaterally Cardio: regular rate and rhythm, S1, S2 normal, no murmur, click, rub or gallop GI: Soft, incision healing well.  JP drainage serous in nature.  JP drain removed.  Lab Results:  Recent Labs    04/28/20 0705  WBC 13.7*  HGB 11.2*  HCT 34.3*  PLT 167   BMET Recent Labs    04/28/20 0705  NA 134*  K 3.0*  CL 104  CO2 21*  GLUCOSE 127*  BUN 6*  CREATININE 0.64  CALCIUM 7.7*   PT/INR No results for input(s): LABPROT, INR in the last 72 hours.  Studies/Results: DG Chest Port 1 View  Result Date: 04/30/2020 CLINICAL DATA:  Wheezing and coughing. EXAM: PORTABLE CHEST 1 VIEW COMPARISON:  One-view chest x-ray 04/22/2020 FINDINGS: Enteric tube was removed. Heart is enlarged. Pulmonary vascular congestion is again noted. Aeration and lung volumes are proved bilaterally. Chronic elevation of the right hemidiaphragm is noted. Mild degenerative changes are noted in the cervical spine and shoulders. IMPRESSION: 1. Stable cardiomegaly and mild pulmonary vascular congestion. 2. Improved aeration and lung volumes bilaterally. Electronically Signed   By: San Morelle M.D.   On: 04/30/2020 05:12    Anti-infectives: Anti-infectives (From admission, onward)   Start     Dose/Rate Route Frequency Ordered Stop   04/26/20 1000   fluconazole (DIFLUCAN) IVPB 400 mg        400 mg 100 mL/hr over 120 Minutes Intravenous Every 24 hours 04/26/20 0907     04/22/20 0100  piperacillin-tazobactam (ZOSYN) IVPB 3.375 g        3.375 g 12.5 mL/hr over 240 Minutes Intravenous Every 8 hours 04/22/20 0002     04/22/20 0000  piperacillin-tazobactam (ZOSYN) IVPB 3.375 g  Status:  Discontinued        3.375 g 100 mL/hr over 30 Minutes Intravenous Every 8 hours 04/21/20 2349 04/22/20 0002      Assessment/Plan: s/p Procedure(s): EXPLORATORY LAPAROTOMY CENTRAL LINE INSERTION GASTRORRHAPHY Impression: Postoperative day 8, status post gastrorrhaphy.  Central line has been removed.  JP drain removed.  Awaiting nursing home placement.  Will stop antibiotics on discharge.  LOS: 8 days    Aviva Signs 04/30/2020

## 2020-04-30 NOTE — Progress Notes (Signed)
Physical Therapy Treatment Patient Details Name: Maria Murphy MRN: [id number] DOB: July 02, 1956 Today's Date: 04/30/2020    History of Present Illness 64 y.o. female s/p  Exploratory laparotomy, gastrorrhaphy Maria Murphy plication), central line placement on 04/22/20 with medical history significant of HTN, decreased memory on medication. Maria Murphy was seen in [facility] last night, 04/20/20 and evaluated for possible code stroke with negative results.  She also reports having had waxing and waning of upper abdominal pain for several days.  She returns to [facility] today for increasing epigastric pain with vomiting that began this morning.  She does admit to drinking more alcohol recently along with taking Aleve for her aches and pains of her joints.  She had a CT scan of her abdomen and pelvis 04/20/20 that revealed cholelithiasis without gallbladder inflammation or biliary dilatation. Coarse calcifications were noted in the pancreatic head consistent with chronic pancreatitis.  There were 3 cystic lesions in the pancreatic head with the largest measuring 2.2 cm.  And she was found to have a bilirubin of 3.    PT Comments    Pt requires mod A to transfer to sitting EOB, heavy cues for sequencing through log rolling and assistance to upright trunk. Once sitting, pt became fatigued and increased pain and unable to tolerate sitting EOB for exercise so pt performed lateral scooting to [facility] then assisted back to supine. Pt requires mod A to scoot up in bed, cued to push through bil feet. Pt tolerates supine therapeutic exercises, but with increased pain so unable to tolerate additional exercises. Pt left supine in bed with alarm on and all needs in reach. Patient will benefit from continued physical therapy in hospital and recommendations below to increase strength, balance, endurance for safe ADLs and gait.    Follow Up Recommendations  SNF     Equipment Recommendations  Rolling walker with 5" wheels     Recommendations for Other Services       Precautions / Restrictions Precautions Precautions: Fall Restrictions Weight Bearing Restrictions: No    Mobility  Bed Mobility Overal bed mobility: Needs Assistance Bed Mobility: Supine to Sit;Sit to Supine  Supine to sit: Mod assist;HOB elevated Sit to supine: Mod assist   General bed mobility comments: mod A form trunk uprighting through log rolling to sit up at bedside, cues for sequencing, mod A to return BLE back into bed, slow, labored movement  Transfers  General transfer comment: pt able to compelte 3 lateral scoots towards HOB with min G assist, unable to stand or take steps due to abdominal pain  Ambulation/Gait  General Gait Details: unable to stand or take steps due to abdominal pain   Stairs             Wheelchair Mobility    Modified Rankin (Stroke Patients Only)       Balance Overall balance assessment: Needs assistance Sitting-balance support: Feet supported;No upper extremity supported Sitting balance-Leahy Scale: Fair Sitting balance - Comments: seated EOB         Cognition Arousal/Alertness: Awake/alert Behavior During Therapy: WFL for tasks assessed/performed Overall Cognitive Status: Within Functional Limits for tasks assessed          Exercises General Exercises - Lower Extremity Ankle Circles/Pumps: AROM;Strengthening;Both;20 reps;Supine Short Arc Quad: AROM;Strengthening;Both;10 reps;Supine Heel Slides: AROM;Strengthening;Both;10 reps;Supine    General Comments General comments (skin integrity, edema, etc.): SpOt 94-95% on RA with HR 99-106 with mobility/exercise      Pertinent Vitals/Pain Pain Assessment: 0-10 Pain Score: 5  Pain Location: abdomen  surgical site Pain Descriptors / Indicators: Aching;Sore;Grimacing;Discomfort Pain Intervention(s): Limited activity within patient's tolerance;Monitored during session;Premedicated before session;Repositioned    Home Living                       Prior Function            PT Goals (current goals can now be found in the care plan section) Acute Rehab PT Goals Patient Stated Goal: return home able to take care of self PT Goal Formulation: With patient Time For Goal Achievement: 05/09/20 Potential to Achieve Goals: Good Progress towards PT goals: Progressing toward goals    Frequency    Min 4X/week      PT Plan Current plan remains appropriate    Co-evaluation              AM-PAC PT "6 Clicks" Mobility   Outcome Measure  Help needed turning from your back to your side while in a flat bed without using bedrails?: A Lot Help needed moving from lying on your back to sitting on the side of a flat bed without using bedrails?: A Lot Help needed moving to and from a bed to a chair (including a wheelchair)?: A Lot Help needed standing up from a chair using your arms (e.g., wheelchair or bedside chair)?: A Lot Help needed to walk in hospital room?: A Lot Help needed climbing 3-5 steps with a railing? : Total 6 Click Score: 11    End of Session   Activity Tolerance: Patient limited by pain Patient left: in bed;with bed alarm set;with call bell/phone within reach Nurse Communication: Mobility status PT Visit Diagnosis: Unsteadiness on feet (R26.81);Other abnormalities of gait and mobility (R26.89);Muscle weakness (generalized) (M62.81)     Time: 2956-2130 PT Time Calculation (min) (ACUTE ONLY): 24 min  Charges:  $Therapeutic Exercise: 8-22 mins $Therapeutic Activity: 8-22 mins                      Maria Murphy PT, DPT 04/30/20, 1:57 PM [phone]

## 2020-04-30 NOTE — Progress Notes (Signed)
8 Days Post-Op  Subjective: Maria Murphy reports some abdominal soreness this morning. She states that she continues to have some shortness of breath but this seems to be improving. She states that using incentive spirometry and moving around seem to help. She continues to have watery bowel movements. Occasional nausea after eating. Some throat soreness since NG tube removal. Reports some momentary, occasional chest pain in her right chest. Says that this only lasts for a second or two. She cannot identify any triggers or alleviating factors. Otherwise denies fever, body aches, chills, and palpitations.  Objective: Vital signs in last 24 hours: Temp:  [99.2 F (37.3 C)] 99.2 F (37.3 C) (09/07 0529) Pulse Rate:  [87-102] 102 (09/07 0529) Resp:  [15-16] 15 (09/07 0529) BP: (150-159)/(88-95) 150/88 (09/07 0529) SpO2:  [91 %-94 %] 94 % (09/07 0529) Last BM Date: 04/28/20  Intake/Output from previous day: 09/06 0701 - 09/07 0700 In: -  Out: 755 [Urine:600; Drains:155] Intake/Output this shift: No intake/output data recorded.  General appearance: alert, cooperative and no distress Resp: normal work of breathing, normal breath sounds bilaterally GI: soft, nondistended, appropriately tender, honeycomb with no drainage or erythema, JP with serous output  Lab Results:  Recent Labs    04/28/20 0705  WBC 13.7*  HGB 11.2*  HCT 34.3*  PLT 167   BMET Recent Labs    04/28/20 0705  NA 134*  K 3.0*  CL 104  CO2 21*  GLUCOSE 127*  BUN 6*  CREATININE 0.64  CALCIUM 7.7*    Studies/Results: DG Chest Port 1 View  Result Date: 04/30/2020 CLINICAL DATA:  Wheezing and coughing. EXAM: PORTABLE CHEST 1 VIEW COMPARISON:  One-view chest x-ray 04/22/2020 FINDINGS: Enteric tube was removed. Heart is enlarged. Pulmonary vascular congestion is again noted. Aeration and lung volumes are proved bilaterally. Chronic elevation of the right hemidiaphragm is noted. Mild degenerative changes are noted in  the cervical spine and shoulders. IMPRESSION: 1. Stable cardiomegaly and mild pulmonary vascular congestion. 2. Improved aeration and lung volumes bilaterally. Electronically Signed   By: San Morelle M.D.   On: 04/30/2020 05:12    Anti-infectives: Anti-infectives (From admission, onward)   Start     Dose/Rate Route Frequency Ordered Stop   04/26/20 1000  fluconazole (DIFLUCAN) IVPB 400 mg        400 mg 100 mL/hr over 120 Minutes Intravenous Every 24 hours 04/26/20 0907     04/22/20 0100  piperacillin-tazobactam (ZOSYN) IVPB 3.375 g        3.375 g 12.5 mL/hr over 240 Minutes Intravenous Every 8 hours 04/22/20 0002     04/22/20 0000  piperacillin-tazobactam (ZOSYN) IVPB 3.375 g  Status:  Discontinued        3.375 g 100 mL/hr over 30 Minutes Intravenous Every 8 hours 04/21/20 2349 04/22/20 0002      Assessment/Plan: s/p Procedure(s): EXPLORATORY LAPAROTOMY CENTRAL LINE INSERTION GASTRORRHAPHY Maria Murphy is a 64 yo s/p Ex lap, gastrorrhaphy, post-op day 8.  Improved shortness of breath with movement and IS. Portable CXR this AM showing continued vascular congestion but improved aeration bilaterally. Chest pain likely musculoskeletal in nature given several days post-op in bed and no concerning features, will continue to monitor. Tolerating diet well though still having some nausea intermittently after meals. Having consistent BM and urine output is good. Hemodynamically stable. K low on 9/5 at 3.0, will replete today. D/c pending SNF placement. -Soft diet -IS, OOB, Up in Chair -continue PT -Zosyn and diflucan until d/c -SCDs, lovenox -K-Chlor  40 mEq BID today   LOS: 8 days    Fulton Reek 04/30/2020

## 2020-04-30 NOTE — TOC Progression Note (Signed)
Transition of Care [facility]) - Progression Note   Patient Details  Name: Maria Murphy MRN: [id number] Date of Birth: 01-16-1956  Transition of Care [facility]) CM/SW [place], LCSW Phone Number: 04/30/2020, 4:38 PM  Clinical Narrative: Patient received bed offers from [facility]. CSW followed up with Ebony Hail with [facility]. Per Ebony Hail, there are no available beds at this time and it will be several days before beds are available. CSW followed up with Jackelyn Poling at [facility]. Per Jackelyn Poling, she will need corporate approval and to meet the patient at the hospital. TOC to follow.  Expected Discharge Plan: [place] Barriers to Discharge: Continued Medical Work up  Expected Discharge Plan and Services Expected Discharge Plan: [place] In-house Referral: Clinical Social Work Post Acute Care Choice: [facility] Living arrangements for the past 2 months: Apartment  Readmission Risk Interventions Readmission Risk Prevention Plan 04/29/2020  Medication Screening Complete  Transportation Screening Complete  Some recent data might be hidden

## 2020-05-01 ENCOUNTER — Inpatient Hospital Stay (HOSPITAL_COMMUNITY): Payer: Medicaid Other

## 2020-05-01 ENCOUNTER — Encounter (HOSPITAL_COMMUNITY): Payer: Self-pay | Admitting: Internal Medicine

## 2020-05-01 ENCOUNTER — Ambulatory Visit (HOSPITAL_COMMUNITY): Payer: Medicaid Other | Attending: General Surgery

## 2020-05-01 DIAGNOSIS — K275 Chronic or unspecified peptic ulcer, site unspecified, with perforation: Secondary | ICD-10-CM

## 2020-05-01 DIAGNOSIS — I2692 Saddle embolus of pulmonary artery without acute cor pulmonale: Secondary | ICD-10-CM

## 2020-05-01 LAB — CBC
HCT: 32.8 % — ABNORMAL LOW (ref 36.0–46.0)
Hemoglobin: 10.8 g/dL — ABNORMAL LOW (ref 12.0–15.0)
MCH: 29.4 pg (ref 26.0–34.0)
MCHC: 32.9 g/dL (ref 30.0–36.0)
MCV: 89.4 fL (ref 80.0–100.0)
Platelets: 198 10*3/uL (ref 150–400)
RBC: 3.67 MIL/uL — ABNORMAL LOW (ref 3.87–5.11)
RDW: 14.6 % (ref 11.5–15.5)
WBC: 17 10*3/uL — ABNORMAL HIGH (ref 4.0–10.5)
nRBC: 0 % (ref 0.0–0.2)

## 2020-05-01 LAB — PROTIME-INR
INR: 1.2 (ref 0.8–1.2)
Prothrombin Time: 14.3 seconds (ref 11.4–15.2)

## 2020-05-01 MED ORDER — FENTANYL CITRATE (PF) 100 MCG/2ML IJ SOLN
INTRAMUSCULAR | Status: AC | PRN
  Administered 2020-05-01 (×2): 50 ug via INTRAVENOUS

## 2020-05-01 MED ORDER — MIDAZOLAM HCL 2 MG/2ML IJ SOLN
INTRAMUSCULAR | Status: AC | PRN
  Administered 2020-05-01 (×2): 1 mg via INTRAVENOUS
  Administered 2020-05-01: 0.5 mg via INTRAVENOUS

## 2020-05-01 MED ORDER — MIDAZOLAM HCL 2 MG/2ML IJ SOLN
INTRAMUSCULAR | Status: AC
  Filled 2020-05-01: qty 4

## 2020-05-01 MED ORDER — HEPARIN BOLUS VIA INFUSION
5000.0000 [IU] | Freq: Once | INTRAVENOUS | Status: AC
  Administered 2020-05-01: 5000 [IU] via INTRAVENOUS
  Filled 2020-05-01: qty 5000

## 2020-05-01 MED ORDER — FENTANYL CITRATE (PF) 100 MCG/2ML IJ SOLN
INTRAMUSCULAR | Status: AC
Start: 2020-05-01 — End: 2020-05-02
  Filled 2020-05-01: qty 4

## 2020-05-01 MED ORDER — IOHEXOL 350 MG/ML SOLN
100.0000 mL | Freq: Once | INTRAVENOUS | Status: AC | PRN
  Administered 2020-05-01: 100 mL via INTRAVENOUS

## 2020-05-01 MED ORDER — HEPARIN (PORCINE) 25000 UT/250ML-% IV SOLN
1600.0000 [IU]/h | INTRAVENOUS | Status: DC
  Administered 2020-05-01: 1300 [IU]/h via INTRAVENOUS
  Administered 2020-05-02: 1500 [IU]/h via INTRAVENOUS
  Administered 2020-05-03: 1600 [IU]/h via INTRAVENOUS
  Filled 2020-05-01 (×3): qty 250

## 2020-05-01 MED ORDER — SODIUM CHLORIDE 0.9% FLUSH
5.0000 mL | Freq: Three times a day (TID) | INTRAVENOUS | Status: DC
  Administered 2020-05-01 – 2020-05-13 (×29): 5 mL

## 2020-05-01 NOTE — Progress Notes (Signed)
Neurointerventional Radiology Brief Note:  Maria Murphy is a 64 year old woman admitted to [facility] 10 days with perforated gastric ulcer POD #8 gastrorrhaphy. She was progressing toward discharge with improvement.  Her central line and surgical JP drain were removed yesterday in anticipation of this, however appears she developed acute nausea/vomiting/pain overnight. CT Abdomen Pelvis shows pelvic fluid collection.   Dr. Laurence Ferrari has approved [name] for a pelvic aspiration/drain.  Very small window, but likely contributing to her symptoms/bowel obstruction and percutaneous attempt can be made.    Discussed with RN who reports patient is stable and consentable.  RN to assist in arranging [facility] transport.  Patient to return to [facility] after procedure.   Dr. Laurence Ferrari also notes concern for possible PE based on CT imaging yesterday. Per RN, patient did increase neb treatments yesterday due to episodes of wheezing and required O2 for shortness of breath while lying flat for scan overnight.  Dr. Arnoldo Morale notified and is aware.  He is ordering a stat CT Chest.  Proceed with transport to IR today as long as patient remains stable this AM.   Brynda Greathouse, MS RD PA-C 8:55 AM

## 2020-05-01 NOTE — H&P (Signed)
History and Physical    Maria Murphy TWS:[id number] DOB: September 23, 1955 DOA: 04/21/2020  PCP: Rosita Fire, MD (Confirm with patient/family/NH records and if not entered, this has to be entered at [facility] point of entry) Patient coming from: [facility]  I have personally briefly reviewed patient's old medical records in [place]  Chief Complaint: Feeling okay  HPI: Maria Murphy is a 64 y.o. female with medical history significant of recent perforated prepyloric gastric ulcer secondary to NSAIDs status post exploratory laparotomy and gastrorrhaphy Maria Murphy plication), chronic NSAIDs use for joint pains, HTN, memory loss, presented to [facility] 10 days ago for new onset of epigastric pain for which patient has been taking extra NSAIDs.  Before that, patient had a positive CT abdomen showed cholelithiasis and gallbladder inflammation/cholecystitis.  Work-up showed enlargement of pancreatic to 2.2 cm, bilirubin increased to 3.  GI consultation required, MRCP demonstrated pneumoperitoneum and ascites.  Patient was started on Zosyn on admission and remained n.p.o.    Soon after admission, patient developed severe sepsis with drop of blood pressure then septic shock, and had to be admitted to ICU for pressor support.  Surgical consult aquired, decision was made to take patient to the OR.  During exploratory laparotomy, bilious fluid was found in peritoneal cavity, and a three quarters of centimeter perforation was found in the prepyloric area along the lesser curvature of the stomach and antrum and  Graham plication was proceeded.  Patient tolerated surgery well.  Peritoneal culture came back positive for Enterococcus and rare Candida.  Zosyn and Diflucan were continued after surgery.  Patient initially recovered well after surgery, JP drain was removed yesterday (POD #8).  Patient was ready to be discharged to SNF to complete antibiotic treatment as of yesterday.  Also noted that the  patient has an up-trending of WBC count 13.7>19.5>17. But no fever or drop of BP.  Overnight, patient developed acute nausea vomiting and lower abdominal discomfort.  CT abdomen pelvis with contrast showed new 5.3 x 14.0 x 3.9 cm abscess in pelvis, and saddle pulmonary embolism accidentally.  A dedicated CTA was done today showed multifocal PE arising in a saddle type distribution each main pulmonary artery with extension into multiple lower lobe pulmonary arteries.  There is also absence consistent with at least submassive PE with RV/LV=1:1.  DVT study negative for DVT today.  Patient denies any chest pain or shortness of breath no cough no fever chills.  Continues to complain about lower abdominal/pelvic heaviness.  Surgeon discussed with IR, given recent surgery patient not a candidate for TPA treatment.  And given the size of PE, surgeon request medicine admission to treat pulmonary embolism, and surgeon agreed with heparin drip.  Review of Systems: As per HPI otherwise 14 point review of systems negative.    Past Medical History:  Diagnosis Date  . Essential hypertension, benign 01/24/2015  . Foot ulcer ([place])   . Hypertension   . Memory loss   . Skin cancer   . Vision abnormalities     Past Surgical History:  Procedure Laterality Date  . CENTRAL LINE INSERTION Right 04/22/2020   Procedure: CENTRAL LINE INSERTION;  Surgeon: Virl Cagey, MD;  Location: [facility];  Service: General;  Laterality: Right;  . GASTRORRHAPHY  04/22/2020   Procedure: GASTRORRHAPHY;  Surgeon: Virl Cagey, MD;  Location: [facility];  Service: General;;  . LAPAROTOMY N/A 04/22/2020   Procedure: EXPLORATORY LAPAROTOMY;  Surgeon: Virl Cagey, MD;  Location: [facility];  Service: General;  Laterality: N/A;  . TONSILLECTOMY       reports that she has never smoked. She has never used smokeless tobacco. She reports that she does not drink alcohol and does not use drugs.  Allergies  Allergen Reactions  .  Wheat Extract Swelling    Family History  Problem Relation Age of Onset  . High Cholesterol Mother   . Hypertension Mother   . Arthritis/Rheumatoid Mother   . Alcohol abuse Father   . Colon cancer Neg Hx   . Colon polyps Neg Hx      Prior to Admission medications   Medication Sig Start Date End Date Taking? Authorizing Provider  acetaminophen (TYLENOL) 500 MG tablet Take 1 tablet (500 mg total) by mouth every 6 (six) hours as needed. 08/10/18  Yes Idol, Almyra Free, PA-C  citalopram (CELEXA) 20 MG tablet Take 1 tablet (20 mg total) by mouth daily. 08/16/19  Yes Jaynee Eagles, PA-C  donepezil (ARICEPT) 10 MG tablet Take 5-10 mg by mouth in the morning and at bedtime.  04/02/20  Yes [provider]  gabapentin (NEURONTIN) 300 MG capsule Take 300 mg by mouth 2 (two) times daily.  08/23/19  Yes [provider]  lisinopril-hydrochlorothiazide (PRINZIDE,ZESTORETIC) 10-12.5 MG tablet Take 1 tablet by mouth daily. 08/07/18  Yes [provider]  traZODone (DESYREL) 100 MG tablet Take 200 mg by mouth at bedtime.  08/02/19  Yes [provider]  celecoxib (CELEBREX) 100 MG capsule Take 1 capsule (100 mg total) by mouth 2 (two) times daily. 04/21/20   Margarita Mail, PA-C  famotidine (PEPCID) 20 MG tablet Take 1 tablet (20 mg total) by mouth 2 (two) times daily. 04/21/20   Harris, Abigail, PA-C  Ginger, Zingiber officinalis, (GINGER PO) Take 1 tablet by mouth daily.     [provider]  methocarbamol (ROBAXIN) 500 MG tablet Take 1 tablet (500 mg total) by mouth every 8 (eight) hours as needed for muscle spasms. Patient not taking: Reported on 04/22/2020 03/26/20   Ezequiel Essex, MD  naproxen (NAPROSYN) 500 MG tablet Take 1 tablet (500 mg total) by mouth 2 (two) times daily as needed. Patient not taking: Reported on 04/22/2020 03/26/20   Ezequiel Essex, MD  ondansetron (ZOFRAN) 4 MG tablet Take 1 tablet (4 mg total) by mouth every 8 (eight) hours as needed for nausea  or vomiting. 04/21/20   Margarita Mail, PA-C    Physical Exam: Vitals:   05/01/20 1400 05/01/20 1405 05/01/20 1410 05/01/20 1415  BP: 120/74 121/70 126/75 134/76  Pulse: 89 96 90 97  Resp: 15 20 14 19   Temp:      TempSrc:      SpO2: 95% 95% 96% 96%  Weight:      Height:        Constitutional: NAD, calm, comfortable Vitals:   05/01/20 1400 05/01/20 1405 05/01/20 1410 05/01/20 1415  BP: 120/74 121/70 126/75 134/76  Pulse: 89 96 90 97  Resp: 15 20 14 19   Temp:      TempSrc:      SpO2: 95% 95% 96% 96%  Weight:      Height:       Eyes: PERRL, lids and conjunctivae normal ENMT: Mucous membranes are moist. Posterior pharynx clear of any exudate or lesions.Normal dentition.  Neck: normal, supple, no masses, no thyromegaly Respiratory: clear to auscultation bilaterally, no wheezing, no crackles. Normal respiratory effort. No accessory muscle use.  Cardiovascular: Regular rate and rhythm, no murmurs / rubs / gallops.  No extremity edema. 2+ pedal pulses. No carotid bruits.  Abdomen: no tenderness, no masses palpated. No hepatosplenomegaly. Bowel sounds positive.  Surgical site clean, no discharge no bleeding Musculoskeletal: no clubbing / cyanosis. No joint deformity upper and lower extremities. Good ROM, no contractures. Normal muscle tone.  Skin: no rashes, lesions, ulcers. No induration Neurologic: CN 2-12 grossly intact. Sensation intact, DTR normal. Strength 5/5 in all 4.  Psychiatric: Normal judgment and insight. Alert and oriented x 3. Normal mood.     Labs on Admission: I have personally reviewed following labs and imaging studies  CBC: Recent Labs  Lab 04/25/20 0545 04/26/20 0803 04/28/20 0705 04/30/20 0906 05/01/20 0534  WBC 12.8* 13.2* 13.7* 19.5* 17.0*  NEUTROABS  --   --  10.2*  --   --   HGB 11.1* 11.3* 11.2* 12.2 10.8*  HCT 34.3* 35.0* 34.3* 37.1 32.8*  MCV 90.7 91.1 89.1 89.8 89.4  PLT 142* 145* 167 200 956   Basic Metabolic Panel: Recent Labs  Lab  04/25/20 0545 04/26/20 0803 04/27/20 1045 04/28/20 0705 04/30/20 0906  NA 136 137  --  134* 136  K 3.7 3.4*  --  3.0* 3.4*  CL 110 110  --  104 99  CO2 19* 20*  --  21* 26  GLUCOSE 161* 131*  --  127* 144*  BUN 13 9  --  6* 5*  CREATININE 0.77 0.65  --  0.64 0.71  CALCIUM 7.5* 7.8*  --  7.7* 8.2*  MG 2.1 2.0  --   --   --   PHOS 1.7* 1.9* 2.8  --   --    GFR: Estimated Creatinine Clearance: 83.6 mL/min (by C-G formula based on SCr of 0.71 mg/dL). Liver Function Tests: No results for input(s): AST, ALT, ALKPHOS, BILITOT, PROT, ALBUMIN in the last 168 hours. No results for input(s): LIPASE, AMYLASE in the last 168 hours. No results for input(s): AMMONIA in the last 168 hours. Coagulation Profile: Recent Labs  Lab 05/01/20 0818  INR 1.2   Cardiac Enzymes: No results for input(s): CKTOTAL, CKMB, CKMBINDEX, TROPONINI in the last 168 hours. BNP (last 3 results) No results for input(s): PROBNP in the last 8760 hours. HbA1C: No results for input(s): HGBA1C in the last 72 hours. CBG: No results for input(s): GLUCAP in the last 168 hours. Lipid Profile: No results for input(s): CHOL, HDL, LDLCALC, TRIG, CHOLHDL, LDLDIRECT in the last 72 hours. Thyroid Function Tests: No results for input(s): TSH, T4TOTAL, FREET4, T3FREE, THYROIDAB in the last 72 hours. Anemia Panel: No results for input(s): VITAMINB12, FOLATE, FERRITIN, TIBC, IRON, RETICCTPCT in the last 72 hours. Urine analysis:    Component Value Date/Time   COLORURINE YELLOW 04/20/2020 2020   APPEARANCEUR CLEAR 04/20/2020 2020   LABSPEC 1.030 04/20/2020 2020   PHURINE 5.0 04/20/2020 2020   GLUCOSEU NEGATIVE 04/20/2020 2020   HGBUR NEGATIVE 04/20/2020 2020   BILIRUBINUR MODERATE (A) 04/20/2020 2020   KETONESUR NEGATIVE 04/20/2020 2020   PROTEINUR NEGATIVE 04/20/2020 2020   NITRITE NEGATIVE 04/20/2020 2020   LEUKOCYTESUR NEGATIVE 04/20/2020 2020    Radiological Exams on Admission: CT ANGIO CHEST PE W OR WO  CONTRAST  Result Date: 05/01/2020 CLINICAL DATA:  Pulmonary embolus seen on recent abdominal CT. Recent repair of gastric ulcer. EXAM: CT ANGIOGRAPHY CHEST WITH CONTRAST TECHNIQUE: Multidetector CT imaging of the chest was performed using the standard protocol during bolus administration of intravenous contrast. Multiplanar CT image reconstructions and MIPs were obtained to evaluate the vascular anatomy.  CONTRAST:  12mL OMNIPAQUE IOHEXOL 350 MG/ML SOLN COMPARISON:  CT abdomen and pelvis including lower lung regions April 30, 2020 FINDINGS: Cardiovascular: Pulmonary emboli arise in each main pulmonary artery in a saddle type distribution with extension of pulmonary emboli into multiple lower lobe pulmonary arterial branches bilaterally. Right ventricle to left ventricle diameter ratio is measured at 1.1 consistent with a degree of right heart strain. There is no appreciable thoracic aortic aneurysm or dissection. Visualized great vessels appear normal. No pericardial effusion or pericardial thickening is evident. Mediastinum/Nodes: Thyroid appears unremarkable. No adenopathy is evident. There is a small hiatal hernia. Lungs/Pleura: There are fairly small pleural effusions bilaterally with consolidation in each lower lobe region, somewhat more on the right than on the left. Upper lung regions are clear. No pneumothorax. Upper Abdomen: There is a mild degree of ascites. There is mild pneumoperitoneum which was present 1 day prior. Incomplete visualization of upper pole renal cystic lesions again noted with complex lesion arising from the periphery of the upper pole the right kidney, better seen on prior CT of the abdomen. Cholelithiasis again noted. Postoperative changes noted in the upper right abdomen, incompletely visualized. Air in the midline upper abdominal wall region likely of recent postoperative etiology. Musculoskeletal: Degenerative change noted in the thoracic spine. No blastic or lytic bone lesions.  No appreciable chest wall lesions. Review of the MIP images confirms the above findings. IMPRESSION: 1. Again noted multifocal pulmonary embolus with pulmonary emboli arising in a saddle type distribution from each main pulmonary artery with extension into multiple lower lobe pulmonary arteries bilaterally. Positive for acute PE with CTevidence of right heart strain (RV/LV Ratio = 1.1) consistent with at least submassive (intermediate risk) PE. The presence of right heart strain has been associated with an increased risk of morbidity and mortality. 2. Airspace consolidation consistent with combination of pneumonia and compressive atelectasis in each lower lung region with fairly small pleural effusions bilaterally. 3.  No appreciable adenopathy. 4.  Small hiatal hernia again noted. 5. Pneumoperitoneum likely secondary to recent surgery. Mild ascites present. Postoperative changes in right upper quadrant again noted. Air in the upper abdominal wall is likely of recent postoperative etiology. 6.  Cholelithiasis. 7. Incomplete visualization of renal cystic lesions with complex mass arising from upper pole right kidney, better seen and characterized on recent abdominal CT examination. These results will be called to the ordering clinician or representative by the Radiologist Assistant, and communication documented in the PACS or [employer]. Electronically Signed   By: Lowella Grip III M.D.   On: 05/01/2020 11:22   CT ABDOMEN PELVIS W CONTRAST  Addendum Date: 05/01/2020   ADDENDUM REPORT: 05/01/2020 08:21 ADDENDUM: Upon further review, there are new thin non-occlusive saddle pulmonary emboli involving the left and right pulmonary arteries with some extension into both lower lobes. Dedicated CTA of the chest is recommended for further evaluation. Critical Value/emergent results were called by telephone on 05/01/2020 at 8:20 am to provider [facility], who verbally acknowledged these results. Electronically  Signed   By: Titus Dubin M.D.   On: 05/01/2020 08:21   Result Date: 05/01/2020 CLINICAL DATA:  History of perforated pre-pyloric gastric ulcer status post repair 8 days ago, now with worsening epigastric pain and vomiting. EXAM: CT ABDOMEN AND PELVIS WITH CONTRAST TECHNIQUE: Multidetector CT imaging of the abdomen and pelvis was performed using the standard protocol following bolus administration of intravenous contrast. CONTRAST:  159mL OMNIPAQUE IOHEXOL 300 MG/ML  SOLN COMPARISON:  MRI abdomen dated April 22, 2020. CT abdomen pelvis dated April 20, 2020. FINDINGS: Lower chest: Unchanged small right and trace left pleural effusions with adjacent lower lobe atelectasis. Hepatobiliary: Unchanged subcentimeter low-density lesion in the hepatic dome, too small to characterize. A small amount of perihepatic fluid with more focal 5.1 x 2.1 x 2.0 cm subcapsular fluid collection along the inferior right liver (series 2, image 31: Series 5, image 70) multiple gallstones again noted. No gallbladder wall thickening or biliary dilatation. Pancreas: Multiple cystic lesions in pancreatic head measuring up to 2.2 cm are unchanged. No ductal dilatation or surrounding inflammatory changes. Spleen: Normal in size without focal abnormality. Adrenals/Urinary Tract: Adrenal glands are unremarkable. Unchanged bilateral renal cysts, including a 1.8 cm complex cyst in the upper pole of the right kidney as characterized on recent MRI. No renal calculi or hydronephrosis. Bladder is unremarkable. Stomach/Bowel: Unchanged small hiatal hernia. Mildly pre-pyloric gastric wall thickening is likely postsurgical. There are few mildly dilated loops of jejunum in the mid and lower abdomen with gradual transition to ileum. Oral contrast reaches the terminal ileum. No bowel wall thickening or surrounding inflammatory changes. Left-sided colonic diverticulosis. Normal appendix. Vascular/Lymphatic: No significant vascular findings. No enlarged  abdominal or pelvic lymph nodes. Reproductive: Unchanged fibroid uterus.  No adnexal mass. Other: New 5.3 x 14.0 x 3.9 cm (AP by transverse by CC) rim enhancing fluid collection in the pelvis anterior to the uterine fundus. There are few residual foci of pneumoperitoneum in the right upper quadrant and deep to the midline abdominal incision. Musculoskeletal: No acute or significant osseous findings. IMPRESSION: 1. New 5.3 x 14.0 x 3.9 cm abscess in the pelvis. 2. Small amount of perihepatic fluid with more focal 5.1 x 2.1 x 2.0 cm subcapsular fluid collection along the inferior right liver, also concerning for abscess. 3. Trace residual pneumoperitoneum in the right upper quadrant and deep to the midline abdominal incision, likely postsurgical. 4. Mildly dilated loops of small bowel in the mid and lower abdomen are favored to represent ileus as oral contrast reaches the terminal ileum. 5. Unchanged cystic lesions in the pancreatic head measuring up to 2.2 cm, better evaluated on recent MRI. Please see that report for follow-up recommendations. 6. Unchanged cholelithiasis. 7. Unchanged small right and trace left pleural effusions. Electronically Signed: By: Titus Dubin M.D. On: 04/30/2020 19:04   US Venous Img Lower Bilateral (DVT)  Result Date: 05/01/2020 CLINICAL DATA:  Recent pulmonary embolus EXAM: BILATERAL LOWER EXTREMITY VENOUS DUPLEX ULTRASOUND TECHNIQUE: Gray-scale sonography with graded compression, as well as color Doppler and duplex ultrasound were performed to evaluate the lower extremity deep venous systems from the level of the common femoral vein and including the common femoral, femoral, profunda femoral, popliteal and calf veins including the posterior tibial, peroneal and gastrocnemius veins when visible. The superficial great saphenous vein was also interrogated. Spectral Doppler was utilized to evaluate flow at rest and with distal augmentation maneuvers in the common femoral, femoral and  popliteal veins. COMPARISON:  CT abdomen and pelvis including portions of lower chest April 30, 2020 FINDINGS: RIGHT LOWER EXTREMITY Common Femoral Vein: No evidence of thrombus. Normal compressibility, respiratory phasicity and response to augmentation. Saphenofemoral Junction: No evidence of thrombus. Normal compressibility and flow on color Doppler imaging. Profunda Femoral Vein: No evidence of thrombus. Normal compressibility and flow on color Doppler imaging. Femoral Vein: No evidence of thrombus. Normal compressibility, respiratory phasicity and response to augmentation. Popliteal Vein: No evidence of thrombus. Normal compressibility, respiratory phasicity and response to augmentation. Calf Veins:  No evidence of thrombus. Normal compressibility and flow on color Doppler imaging. Superficial Great Saphenous Vein: No evidence of thrombus. Normal compressibility. Venous Reflux:  None. Other Findings:  None. LEFT LOWER EXTREMITY Common Femoral Vein: No evidence of thrombus. Normal compressibility, respiratory phasicity and response to augmentation. Saphenofemoral Junction: No evidence of thrombus. Normal compressibility and flow on color Doppler imaging. Profunda Femoral Vein: No evidence of thrombus. Normal compressibility and flow on color Doppler imaging. Femoral Vein: No evidence of thrombus. Normal compressibility, respiratory phasicity and response to augmentation. Popliteal Vein: No evidence of thrombus. Normal compressibility, respiratory phasicity and response to augmentation. Calf Veins: No evidence of thrombus. Normal compressibility and flow on color Doppler imaging. Superficial Great Saphenous Vein: No evidence of thrombus. Normal compressibility. Venous Reflux:  None. Other Findings:  None. IMPRESSION: No evidence of deep venous thrombosis in either lower extremity. Electronically Signed   By: Lowella Grip III M.D.   On: 05/01/2020 10:17   DG Chest Port 1 View  Result Date:  04/30/2020 CLINICAL DATA:  Wheezing and coughing. EXAM: PORTABLE CHEST 1 VIEW COMPARISON:  One-view chest x-ray 04/22/2020 FINDINGS: Enteric tube was removed. Heart is enlarged. Pulmonary vascular congestion is again noted. Aeration and lung volumes are proved bilaterally. Chronic elevation of the right hemidiaphragm is noted. Mild degenerative changes are noted in the cervical spine and shoulders. IMPRESSION: 1. Stable cardiomegaly and mild pulmonary vascular congestion. 2. Improved aeration and lung volumes bilaterally. Electronically Signed   By: San Morelle M.D.   On: 04/30/2020 05:12    EKG: Independently reviewed.  Sinus rhythm, no acute ST changes  Assessment/Plan Active Problems:   Essential hypertension, benign   Hyperbilirubinemia   Epigastric pain   Perforated viscus   Perforated abdominal viscus   Peptic ulcer with perforation (HCC)   Hypotension  (please populate well all problems here in Problem List. (For example, if patient is on BP meds at home and you resume or decide to hold them, it is a problem that needs to be her. Same for CAD, COPD, HLD and so on)  Submassive PE -As discussed with surgeon and IR, not a candidate for tPA, for recent intra-abdominal surgery.  Surgeon agreed with heparin drip.  Given the size of the PE, surgeon concerned about patient's deterioration and may need further IR/pulmonary intervention, thus ask medicine to admit/trasfer pt to [facility] for close monitoring. -Echo to evaluate RV function -DVT negative. -Probably a provoked PE given the history of GI perforation and surgery, recommend outpatient hematology follow-up for hypercoagulable state work-up.  Peritonitis status post septic shock -Continue Zosyn and Diflucan for now, discussed with infection disease doctor, recommend at least 2 weeks Zosyn plus Diflucan Diflucan and rescan CT abdomen pelvis. -Re-adjust antibiotic regimen according to culture sent today  Recent gastric perforation  status post exploratory laparotomy and Maria Murphy patch -Continue PPI -Surgeon cleared patient to resume regular diet and discharge to SNF  Intra-abdominal abscess -Undergoing IR guided drainage, IR plans to remove drainage in about 2 weeks, follow-up culture to readjust antibiotics -Anatomical location of the pelvic abscess seems somewhat far from the gastric perforation, d/w ID attending Dr. Linus Salmons, infection disease recommend continue Zosyn and Diflucan for at least 2 weeks from now and rescan CT abdomen and pelvis to document resolution.  Pancreatic cyst -Outpatient GI follow-up for EUS/FNA to rule out malignancy  HTN -BP borderline low, hold BP meds for now.  Memory loss -Continue home meds and SSRI.  DVT prophylaxis: Heparin drip  Code Status: Full  Family Communication: Sister not picking up x2. Disposition Plan: Pt sick, will need more than 2 midnight hospital stay Consults called: IR. ID over phone Admission status: PCU   Lequita Halt MD [facility] Pager [phone]  05/01/2020, 2:29 PM

## 2020-05-01 NOTE — Sedation Documentation (Signed)
Patient transported to 4E Room 11. Patient informed by Dr. Roosevelt Locks prior to sedation and procedure that she will be transferred here to [facility] for management of her pulmonary embolism. Patient sister to be informed by MD on transfer.   1445-Patient transferred to [place] room 11. Report given to 4E RN. Patient belongings (cellphone) transferred with patient to room.

## 2020-05-01 NOTE — Progress Notes (Signed)
Patient currently at [facility], IR.  Had extensive discussion with Dr. Laurence Ferrari.  It is not felt that she needs IR treatment of her pulmonary emboli.  She will have drainage of her pelvic abscess.  Due to the newly diagnosed saddle embolus, it is felt the patient would be better served at [facility] should she suddenly decompensate and need emergent interventional radiology.  I have discussed this with Dr. Roosevelt Locks at [facility] who was agreed to transfer the patient to the hospitalist service.  Dr. Laurence Ferrari of IR is aware.  I have tried to contact a sister of the patient but was unable to get them.  Please call me at [phone] with any questions.  From a surgical standpoint, nothing more is needed.  She should continue on Protonix as this was an NSAID induced peptic ulcer perforation.

## 2020-05-01 NOTE — Progress Notes (Signed)
Patient arrived from IR to 4E room 11 after transfer from [facility].  CHG bath and skin assessment completed.  Telemetry monitor applied and CCMD notified.  Pt oriented to unit and room to include call light and phone.  Will continue to monitor.

## 2020-05-01 NOTE — Progress Notes (Addendum)
9 Days Post-Op  Subjective: Pt reports having some lower midline abdominal pain that was not present several days ago. She has been passing gas but has not had a bowel movement since yesterday. Minimal appetite today and continues to have some shortness of breath. Pt requiring O2 at 2 LPM this morning.  Objective: Vital signs in last 24 hours: Temp:  [99 F (37.2 C)-99.5 F (37.5 C)] 99.1 F (37.3 C) (09/08 0541) Pulse Rate:  [89-101] 89 (09/08 0541) Resp:  [14-20] 14 (09/08 0541) BP: (142-163)/(90-97) 161/91 (09/08 0541) SpO2:  [93 %-98 %] 97 % (09/08 0541) Last BM Date: 04/30/20  Intake/Output from previous day: 09/07 0701 - 09/08 0700 In: 100 [IV Piggyback:100] Out: 1750 [Urine:1750]  General appearance:alert, cooperative and no distress Resp:normal work of breathing, normal breath sounds bilaterally AL:PFXT, nondistended, appropriately tender, steri-strips in place over midline incision - without erythema or drainage, JP tube removed  Lab Results:  Recent Labs    04/30/20 0906 05/01/20 0534  WBC 19.5* 17.0*  HGB 12.2 10.8*  HCT 37.1 32.8*  PLT 200 198   BMET Recent Labs    04/30/20 0906  NA 136  K 3.4*  CL 99  CO2 26  GLUCOSE 144*  BUN 5*  CREATININE 0.71  CALCIUM 8.2*   Studies/Results: CT ABDOMEN PELVIS W CONTRAST  Addendum Date: 05/01/2020   ADDENDUM REPORT: 05/01/2020 08:21 ADDENDUM: Upon further review, there are new thin non-occlusive saddle pulmonary emboli involving the left and right pulmonary arteries with some extension into both lower lobes. Dedicated CTA of the chest is recommended for further evaluation. Critical Value/emergent results were called by telephone on 05/01/2020 at 8:20 am to provider [facility], who verbally acknowledged these results. Electronically Signed   By: Titus Dubin M.D.   On: 05/01/2020 08:21   Result Date: 05/01/2020 CLINICAL DATA:  History of perforated pre-pyloric gastric ulcer status post repair 8 days ago, now  with worsening epigastric pain and vomiting. EXAM: CT ABDOMEN AND PELVIS WITH CONTRAST TECHNIQUE: Multidetector CT imaging of the abdomen and pelvis was performed using the standard protocol following bolus administration of intravenous contrast. CONTRAST:  157mL OMNIPAQUE IOHEXOL 300 MG/ML  SOLN COMPARISON:  MRI abdomen dated April 22, 2020. CT abdomen pelvis dated April 20, 2020. FINDINGS: Lower chest: Unchanged small right and trace left pleural effusions with adjacent lower lobe atelectasis. Hepatobiliary: Unchanged subcentimeter low-density lesion in the hepatic dome, too small to characterize. A small amount of perihepatic fluid with more focal 5.1 x 2.1 x 2.0 cm subcapsular fluid collection along the inferior right liver (series 2, image 31: Series 5, image 70) multiple gallstones again noted. No gallbladder wall thickening or biliary dilatation. Pancreas: Multiple cystic lesions in pancreatic head measuring up to 2.2 cm are unchanged. No ductal dilatation or surrounding inflammatory changes. Spleen: Normal in size without focal abnormality. Adrenals/Urinary Tract: Adrenal glands are unremarkable. Unchanged bilateral renal cysts, including a 1.8 cm complex cyst in the upper pole of the right kidney as characterized on recent MRI. No renal calculi or hydronephrosis. Bladder is unremarkable. Stomach/Bowel: Unchanged small hiatal hernia. Mildly pre-pyloric gastric wall thickening is likely postsurgical. There are few mildly dilated loops of jejunum in the mid and lower abdomen with gradual transition to ileum. Oral contrast reaches the terminal ileum. No bowel wall thickening or surrounding inflammatory changes. Left-sided colonic diverticulosis. Normal appendix. Vascular/Lymphatic: No significant vascular findings. No enlarged abdominal or pelvic lymph nodes. Reproductive: Unchanged fibroid uterus.  No adnexal mass. Other: New 5.3 x  14.0 x 3.9 cm (AP by transverse by CC) rim enhancing fluid collection in the  pelvis anterior to the uterine fundus. There are few residual foci of pneumoperitoneum in the right upper quadrant and deep to the midline abdominal incision. Musculoskeletal: No acute or significant osseous findings. IMPRESSION: 1. New 5.3 x 14.0 x 3.9 cm abscess in the pelvis. 2. Small amount of perihepatic fluid with more focal 5.1 x 2.1 x 2.0 cm subcapsular fluid collection along the inferior right liver, also concerning for abscess. 3. Trace residual pneumoperitoneum in the right upper quadrant and deep to the midline abdominal incision, likely postsurgical. 4. Mildly dilated loops of small bowel in the mid and lower abdomen are favored to represent ileus as oral contrast reaches the terminal ileum. 5. Unchanged cystic lesions in the pancreatic head measuring up to 2.2 cm, better evaluated on recent MRI. Please see that report for follow-up recommendations. 6. Unchanged cholelithiasis. 7. Unchanged small right and trace left pleural effusions. Electronically Signed: By: Titus Dubin M.D. On: 04/30/2020 19:04   DG Chest Port 1 View  Result Date: 04/30/2020 CLINICAL DATA:  Wheezing and coughing. EXAM: PORTABLE CHEST 1 VIEW COMPARISON:  One-view chest x-ray 04/22/2020 FINDINGS: Enteric tube was removed. Heart is enlarged. Pulmonary vascular congestion is again noted. Aeration and lung volumes are proved bilaterally. Chronic elevation of the right hemidiaphragm is noted. Mild degenerative changes are noted in the cervical spine and shoulders. IMPRESSION: 1. Stable cardiomegaly and mild pulmonary vascular congestion. 2. Improved aeration and lung volumes bilaterally. Electronically Signed   By: San Morelle M.D.   On: 04/30/2020 05:12    Anti-infectives: Anti-infectives (From admission, onward)   Start     Dose/Rate Route Frequency Ordered Stop   04/26/20 1000  fluconazole (DIFLUCAN) IVPB 400 mg        400 mg 100 mL/hr over 120 Minutes Intravenous Every 24 hours 04/26/20 0907     04/22/20 0100   piperacillin-tazobactam (ZOSYN) IVPB 3.375 g        3.375 g 12.5 mL/hr over 240 Minutes Intravenous Every 8 hours 04/22/20 0002     04/22/20 0000  piperacillin-tazobactam (ZOSYN) IVPB 3.375 g  Status:  Discontinued        3.375 g 100 mL/hr over 30 Minutes Intravenous Every 8 hours 04/21/20 2349 04/22/20 0002      Assessment/Plan: s/p Procedure(s): EXPLORATORY LAPAROTOMY CENTRAL LINE INSERTION GASTRORRHAPHY Ms. Johndrow is a 64 yo s/p Ex lap, gastrorrhaphy, post-op day 9. Pt hemodynamically stable and afebrile but with persistent leukocytosis and new lower midline abdominal pain. Ct abdomen shows new abscess in the pelvis measuring 5.3x14.0x3.9 cm. Additional findings concerning for smaller abscess in the subcapsular area of the inferior right liver. This is likely contributing to her continued leukocytosis and new lower midline abdominal pain. Pt to be transported to [facility] for drainage by IR. Will continue anti-infectives with Zosyn and Diflucan. Additionally, thin, non-occlusive saddle emboli found in pulmonary arteries. This is likely the cause of her shortness of breath/intermittent oxygen requirement. Pt is not a candidate for TPA given recent surgery. Will perform CT angio chest, LE duplex ultrasound, and heparinize the patient. -NPO -CT angio chest -LE duplex ultrasound -hold Lovenox -heparin gtt pending abscess drainage -Zosyn and diflucan   LOS: 9 days    Fulton Reek 05/01/2020

## 2020-05-01 NOTE — TOC Progression Note (Signed)
Transition of Care [facility]) - Progression Note   Patient Details  Name: Shivani Barrantes MRN: [id number] Date of Birth: 1956-03-01  Transition [facility] CM/SW [place], LCSW Phone Number: 05/01/2020, 1:13 PM  Clinical Narrative: CSW spoke with Debbie from [place] for the patient to be assessed today. CSW updated by physician that patient was transferred to [facility] due to PE. CSW updated Debbie at [place]. Patient cannot be assessed by Jackelyn Poling at this time. TOC to follow.  Expected Discharge Plan: [place] Barriers to Discharge: Continued Medical Work up  Expected Discharge Plan and Services Expected Discharge Plan: [place] In-house Referral: Clinical Social Work Post Acute Care Choice: [place] Living arrangements for the past 2 months: Apartment  Readmission Risk Interventions Readmission Risk Prevention Plan 04/29/2020  Medication Screening Complete  Transportation Screening Complete  Some recent data might be hidden

## 2020-05-01 NOTE — Procedures (Signed)
Interventional Radiology Procedure Note  Procedure: Placement of a 96F drain into the low abdominopelvic fluid collection.  Aspiration yields 30 mL purulent appearing fluid.   Complications: None  Estimated Blood Loss: None  Recommendations: - Drain to JP bulb - Flush Q shift   Signed,  Criselda Peaches, MD

## 2020-05-01 NOTE — Progress Notes (Signed)
Danise Mina, RN at [facility] 4E 11 called to receive report.  Report given.  Sister called and will stop by and get patient's belongings

## 2020-05-01 NOTE — Consult Note (Signed)
Chief Complaint: Patient was seen in consultation today for intra-abdominal fluid collection  Referring Physician(s): Dr. Aviva Signs  Supervising Physician: Jacqulynn Cadet  Patient Status: [facility] - In-pt  History of Present Illness: Maria Murphy is a 64 y.o. female with history of HTN who presented to the [facility] ED 04/20/20 with upper abdominal pain for several days, vomiting, recent alcohol use.  CT Abdomen Pelvis shows cholelithiasis, possible pancreatitis. GI was consulted and MRI/MRCP obtained which showed pneumoperitoneum with perforated gastric ulcer. Patient now POD #8 from gastrorrhaphy.  She was progressing well toward discharge when she develop acute pain last evening.  CT Abdomen/Pelvis obtained which showed: 1. New 5.3 x 14.0 x 3.9 cm abscess in the pelvis. 2. Small amount of perihepatic fluid with more focal 5.1 x 2.1 x 2.0 cm subcapsular fluid collection along the inferior right liver, also concerning for abscess. 3. Trace residual pneumoperitoneum in the right upper quadrant and deep to the midline abdominal incision, likely postsurgical. 4. Mildly dilated loops of small bowel in the mid and lower abdomen are favored to represent ileus as oral contrast reaches the terminal ileum. 5. Unchanged cystic lesions in the pancreatic head measuring up to 2.2 cm, better evaluated on recent MRI. Please see that report for follow-up recommendations. 6. Unchanged cholelithiasis. 7. Unchanged small right and trace left pleural effusions.  Upon further review, there are new thin non-occlusive saddle pulmonary emboli involving the left and right pulmonary arteries with some extension into both lower lobes. Dedicated CTA of the chest is recommended for further evaluation.  IR consulted for aspiration and drainage of pelvic fluid collection.  Patient case reviewed by Dr. Laurence Ferrari who approves for percutaneous attempt today.   Discussed with RN this AM who noted new change in  O2 requirement overnight likely related to new CT findings of saddle PE which was later confirmed by CTA Chest this AM.   Patient transported to [facility] IR via [place].  She arrives in stable condition, mentating well. Describes ongoing generalized abdominal pain, difficulty urinating. Mild nausea. She is agreeable to procedure today.   Past Medical History:  Diagnosis Date  . Essential hypertension, benign 01/24/2015  . Foot ulcer ([place])   . Hypertension   . Memory loss   . Skin cancer   . Vision abnormalities     Past Surgical History:  Procedure Laterality Date  . CENTRAL LINE INSERTION Right 04/22/2020   Procedure: CENTRAL LINE INSERTION;  Surgeon: Virl Cagey, MD;  Location: [facility] ORS;  Service: General;  Laterality: Right;  . GASTRORRHAPHY  04/22/2020   Procedure: GASTRORRHAPHY;  Surgeon: Virl Cagey, MD;  Location: [facility] ORS;  Service: General;;  . LAPAROTOMY N/A 04/22/2020   Procedure: EXPLORATORY LAPAROTOMY;  Surgeon: Virl Cagey, MD;  Location: [facility] ORS;  Service: General;  Laterality: N/A;  . TONSILLECTOMY      Allergies: Wheat extract  Medications: Prior to Admission medications   Medication Sig Start Date End Date Taking? Authorizing Provider  acetaminophen (TYLENOL) 500 MG tablet Take 1 tablet (500 mg total) by mouth every 6 (six) hours as needed. 08/10/18  Yes Idol, Almyra Free, PA-C  citalopram (CELEXA) 20 MG tablet Take 1 tablet (20 mg total) by mouth daily. 08/16/19  Yes Jaynee Eagles, PA-C  donepezil (ARICEPT) 10 MG tablet Take 5-10 mg by mouth in the morning and at bedtime.  04/02/20  Yes [provider]  gabapentin (NEURONTIN) 300 MG capsule Take 300 mg by mouth 2 (two) times daily.  08/23/19  Yes  [provider]  lisinopril-hydrochlorothiazide (PRINZIDE,ZESTORETIC) 10-12.5 MG tablet Take 1 tablet by mouth daily. 08/07/18  Yes [provider]  traZODone (DESYREL) 100 MG tablet Take 200 mg by mouth at bedtime.  08/02/19  Yes [provider]  celecoxib (CELEBREX) 100 MG capsule Take 1 capsule (100 mg total) by mouth 2 (two) times daily. 04/21/20   Margarita Mail, PA-C  famotidine (PEPCID) 20 MG tablet Take 1 tablet (20 mg total) by mouth 2 (two) times daily. 04/21/20   Harris, Abigail, PA-C  Ginger, Zingiber officinalis, (GINGER PO) Take 1 tablet by mouth daily.     [provider]  methocarbamol (ROBAXIN) 500 MG tablet Take 1 tablet (500 mg total) by mouth every 8 (eight) hours as needed for muscle spasms. Patient not taking: Reported on 04/22/2020 03/26/20   Ezequiel Essex, MD  naproxen (NAPROSYN) 500 MG tablet Take 1 tablet (500 mg total) by mouth 2 (two) times daily as needed. Patient not taking: Reported on 04/22/2020 03/26/20   Ezequiel Essex, MD  ondansetron (ZOFRAN) 4 MG tablet Take 1 tablet (4 mg total) by mouth every 8 (eight) hours as needed for nausea or vomiting. 04/21/20   Margarita Mail, PA-C     Family History  Problem Relation Age of Onset  . High Cholesterol Mother   . Hypertension Mother   . Arthritis/Rheumatoid Mother   . Alcohol abuse Father   . Colon cancer Neg Hx   . Colon polyps Neg Hx     Social History   Socioeconomic History  . Marital status: Widowed    Spouse name: Not on file  . Number of children: Not on file  . Years of education: Not on file  . Highest education level: Not on file  Occupational History  . Not on file  Tobacco Use  . Smoking status: Never Smoker  . Smokeless tobacco: Never Used  Vaping Use  . Vaping Use: Never used  Substance and Sexual Activity  . Alcohol use: No    Alcohol/week: 1.0 standard drink    Types: 1 Glasses of wine per week    Comment: drank alcohol for past 2 weeks, one glass of wine. Prior to this would drink glass of alcohol on weekends with dinner.   . Drug use: No  . Sexual activity: Not Currently    Birth control/protection: Post-menopausal  Other Topics Concern  . Not on file  Social History Narrative  . Not on file    Social Determinants of Health   Financial Resource Strain:   . Difficulty of Paying Living Expenses: Not on file  Food Insecurity:   . Worried About Charity fundraiser in the Last Year: Not on file  . Ran Out of Food in the Last Year: Not on file  Transportation Needs:   . Lack of Transportation (Medical): Not on file  . Lack of Transportation (Non-Medical): Not on file  Physical Activity:   . Days of Exercise per Week: Not on file  . Minutes of Exercise per Session: Not on file  Stress:   . Feeling of Stress : Not on file  Social Connections:   . Frequency of Communication with Friends and Family: Not on file  . Frequency of Social Gatherings with Friends and Family: Not on file  . Attends Religious Services: Not on file  . Active Member of Clubs or Organizations: Not on file  . Attends Archivist Meetings: Not on file  . Marital Status: Not on file  Review of Systems: A 12 point ROS discussed and pertinent positives are indicated in the HPI above.  All other systems are negative.  Review of Systems  Constitutional: Negative for fatigue and fever.  Respiratory: Negative for cough and shortness of breath.   Cardiovascular: Negative for chest pain.  Gastrointestinal: Positive for abdominal pain, nausea and vomiting.  Genitourinary: Positive for difficulty urinating.  Musculoskeletal: Negative for back pain.  Psychiatric/Behavioral: Negative for behavioral problems and confusion.    Vital Signs: BP (!) 142/80   Pulse 82   Temp 98.9 F (37.2 C)   Resp 16   Ht 5\' 4"  (1.626 m)   Wt 224 lb 13.9 oz (102 kg)   SpO2 97%   BMI 38.60 kg/m   Physical Exam Vitals and nursing note reviewed.  Constitutional:      General: She is not in acute distress.    Appearance: She is well-developed. She is not ill-appearing.  Cardiovascular:     Rate and Rhythm: Normal rate and regular rhythm.  Pulmonary:     Effort: Pulmonary effort is normal.     Breath sounds:  Normal breath sounds.  Abdominal:     Palpations: Abdomen is soft.     Tenderness: There is abdominal tenderness.  Skin:    General: Skin is warm and dry.  Neurological:     General: No focal deficit present.     Mental Status: She is alert and oriented to person, place, and time.  Psychiatric:        Mood and Affect: Mood normal.        Behavior: Behavior normal.      MD Evaluation Airway: WNL Heart: WNL Abdomen: WNL Chest/ Lungs: Other (comments) Chest/ lungs comments: PE ASA  Classification: 3 Mallampati/Airway Score: One   Imaging: CT Head Wo Contrast  Result Date: 04/20/2020 CLINICAL DATA:  Mental status change EXAM: CT HEAD WITHOUT CONTRAST TECHNIQUE: Contiguous axial images were obtained from the base of the skull through the vertex without intravenous contrast. COMPARISON:  CT brain 03/19/2020 FINDINGS: Brain: No acute territorial infarction, hemorrhage, or intracranial mass. The ventricles are nonenlarged. Vascular: No hyperdense vessels.  No unexpected calcification. Skull: Normal. Negative for fracture or focal lesion. Sinuses/Orbits: No acute finding. Other: None IMPRESSION: Negative non contrasted CT appearance of the brain. Electronically Signed   By: Donavan Foil M.D.   On: 04/20/2020 21:14   CT ANGIO CHEST PE W OR WO CONTRAST  Result Date: 05/01/2020 CLINICAL DATA:  Pulmonary embolus seen on recent abdominal CT. Recent repair of gastric ulcer. EXAM: CT ANGIOGRAPHY CHEST WITH CONTRAST TECHNIQUE: Multidetector CT imaging of the chest was performed using the standard protocol during bolus administration of intravenous contrast. Multiplanar CT image reconstructions and MIPs were obtained to evaluate the vascular anatomy. CONTRAST:  172mL OMNIPAQUE IOHEXOL 350 MG/ML SOLN COMPARISON:  CT abdomen and pelvis including lower lung regions April 30, 2020 FINDINGS: Cardiovascular: Pulmonary emboli arise in each main pulmonary artery in a saddle type distribution with extension  of pulmonary emboli into multiple lower lobe pulmonary arterial branches bilaterally. Right ventricle to left ventricle diameter ratio is measured at 1.1 consistent with a degree of right heart strain. There is no appreciable thoracic aortic aneurysm or dissection. Visualized great vessels appear normal. No pericardial effusion or pericardial thickening is evident. Mediastinum/Nodes: Thyroid appears unremarkable. No adenopathy is evident. There is a small hiatal hernia. Lungs/Pleura: There are fairly small pleural effusions bilaterally with consolidation in each lower lobe region, somewhat more on the  right than on the left. Upper lung regions are clear. No pneumothorax. Upper Abdomen: There is a mild degree of ascites. There is mild pneumoperitoneum which was present 1 day prior. Incomplete visualization of upper pole renal cystic lesions again noted with complex lesion arising from the periphery of the upper pole the right kidney, better seen on prior CT of the abdomen. Cholelithiasis again noted. Postoperative changes noted in the upper right abdomen, incompletely visualized. Air in the midline upper abdominal wall region likely of recent postoperative etiology. Musculoskeletal: Degenerative change noted in the thoracic spine. No blastic or lytic bone lesions. No appreciable chest wall lesions. Review of the MIP images confirms the above findings. IMPRESSION: 1. Again noted multifocal pulmonary embolus with pulmonary emboli arising in a saddle type distribution from each main pulmonary artery with extension into multiple lower lobe pulmonary arteries bilaterally. Positive for acute PE with CTevidence of right heart strain (RV/LV Ratio = 1.1) consistent with at least submassive (intermediate risk) PE. The presence of right heart strain has been associated with an increased risk of morbidity and mortality. 2. Airspace consolidation consistent with combination of pneumonia and compressive atelectasis in each lower  lung region with fairly small pleural effusions bilaterally. 3.  No appreciable adenopathy. 4.  Small hiatal hernia again noted. 5. Pneumoperitoneum likely secondary to recent surgery. Mild ascites present. Postoperative changes in right upper quadrant again noted. Air in the upper abdominal wall is likely of recent postoperative etiology. 6.  Cholelithiasis. 7. Incomplete visualization of renal cystic lesions with complex mass arising from upper pole right kidney, better seen and characterized on recent abdominal CT examination. These results will be called to the ordering clinician or representative by the Radiologist Assistant, and communication documented in the PACS or [employer]. Electronically Signed   By: Lowella Grip III M.D.   On: 05/01/2020 11:22   CT ABDOMEN PELVIS W CONTRAST  Addendum Date: 05/01/2020   ADDENDUM REPORT: 05/01/2020 08:21 ADDENDUM: Upon further review, there are new thin non-occlusive saddle pulmonary emboli involving the left and right pulmonary arteries with some extension into both lower lobes. Dedicated CTA of the chest is recommended for further evaluation. Critical Value/emergent results were called by telephone on 05/01/2020 at 8:20 am to provider [facility], who verbally acknowledged these results. Electronically Signed   By: Titus Dubin M.D.   On: 05/01/2020 08:21   Result Date: 05/01/2020 CLINICAL DATA:  History of perforated pre-pyloric gastric ulcer status post repair 8 days ago, now with worsening epigastric pain and vomiting. EXAM: CT ABDOMEN AND PELVIS WITH CONTRAST TECHNIQUE: Multidetector CT imaging of the abdomen and pelvis was performed using the standard protocol following bolus administration of intravenous contrast. CONTRAST:  188mL OMNIPAQUE IOHEXOL 300 MG/ML  SOLN COMPARISON:  MRI abdomen dated April 22, 2020. CT abdomen pelvis dated April 20, 2020. FINDINGS: Lower chest: Unchanged small right and trace left pleural effusions with adjacent  lower lobe atelectasis. Hepatobiliary: Unchanged subcentimeter low-density lesion in the hepatic dome, too small to characterize. A small amount of perihepatic fluid with more focal 5.1 x 2.1 x 2.0 cm subcapsular fluid collection along the inferior right liver (series 2, image 31: Series 5, image 70) multiple gallstones again noted. No gallbladder wall thickening or biliary dilatation. Pancreas: Multiple cystic lesions in pancreatic head measuring up to 2.2 cm are unchanged. No ductal dilatation or surrounding inflammatory changes. Spleen: Normal in size without focal abnormality. Adrenals/Urinary Tract: Adrenal glands are unremarkable. Unchanged bilateral renal cysts, including a 1.8 cm complex  cyst in the upper pole of the right kidney as characterized on recent MRI. No renal calculi or hydronephrosis. Bladder is unremarkable. Stomach/Bowel: Unchanged small hiatal hernia. Mildly pre-pyloric gastric wall thickening is likely postsurgical. There are few mildly dilated loops of jejunum in the mid and lower abdomen with gradual transition to ileum. Oral contrast reaches the terminal ileum. No bowel wall thickening or surrounding inflammatory changes. Left-sided colonic diverticulosis. Normal appendix. Vascular/Lymphatic: No significant vascular findings. No enlarged abdominal or pelvic lymph nodes. Reproductive: Unchanged fibroid uterus.  No adnexal mass. Other: New 5.3 x 14.0 x 3.9 cm (AP by transverse by CC) rim enhancing fluid collection in the pelvis anterior to the uterine fundus. There are few residual foci of pneumoperitoneum in the right upper quadrant and deep to the midline abdominal incision. Musculoskeletal: No acute or significant osseous findings. IMPRESSION: 1. New 5.3 x 14.0 x 3.9 cm abscess in the pelvis. 2. Small amount of perihepatic fluid with more focal 5.1 x 2.1 x 2.0 cm subcapsular fluid collection along the inferior right liver, also concerning for abscess. 3. Trace residual pneumoperitoneum in  the right upper quadrant and deep to the midline abdominal incision, likely postsurgical. 4. Mildly dilated loops of small bowel in the mid and lower abdomen are favored to represent ileus as oral contrast reaches the terminal ileum. 5. Unchanged cystic lesions in the pancreatic head measuring up to 2.2 cm, better evaluated on recent MRI. Please see that report for follow-up recommendations. 6. Unchanged cholelithiasis. 7. Unchanged small right and trace left pleural effusions. Electronically Signed: By: Titus Dubin M.D. On: 04/30/2020 19:04   CT ABDOMEN PELVIS W CONTRAST  Result Date: 04/20/2020 CLINICAL DATA:  Acute abdominal pain. Elevated bilirubin. Altered mental status. EXAM: CT ABDOMEN AND PELVIS WITH CONTRAST TECHNIQUE: Multidetector CT imaging of the abdomen and pelvis was performed using the standard protocol following bolus administration of intravenous contrast. CONTRAST:  110mL OMNIPAQUE IOHEXOL 300 MG/ML  SOLN COMPARISON:  No prior abdominal imaging. FINDINGS: Lower chest: Mild hypoventilatory changes in the lung bases. No pleural fluid or confluent airspace disease. Hepatobiliary: 7 mm hypodensity in the right dome of the liver, series 2, image 12, too small to accurately characterize. Multiple cholesterol gallstones in the gallbladder. No pericholecystic inflammation or evidence of wall thickening. No common bile duct dilatation. Pancreas: Coarse calcification in the pancreatic head. There are at least 3 cystic lesions in the pancreatic head/uncinate process, largest measuring 2.2 cm, series 2, image 29. There is an adjacent 1.3 cm cystic structure and a 1 cm cyst more inferiorly. No definite peripancreatic fat stranding or inflammation. There is no pancreatic ductal dilatation. Spleen: Normal in size without focal abnormality. Adrenals/Urinary Tract: No adrenal nodule. There is a punctate calcification involving the medial limb of the left adrenal gland. Indeterminate lesion arising from the  upper right kidney measures 17 x 18 mm has internal heterogeneity and peripheral calcifications. This is adjacent to a small cortical cyst. There are scattered additional cysts within the right kidney as well as parapelvic cysts. Cortical as well as parapelvic cysts in the left kidney. No hydronephrosis. No perinephric edema. Urinary bladder is unremarkable. Stomach/Bowel: Small hiatal hernia with mild distal esophageal wall thickening. Stomach is unremarkable. Normal positioning of the duodenum and ligament of Treitz. There is no small bowel obstruction or inflammatory change. Cecum is high-riding in the right mid abdomen. Normal air-filled appendix. Transverse colonic tortuosity. Colonic diverticulosis involving the distal descending and sigmoid colon. No acute diverticulitis. Vascular/Lymphatic: Aortic atherosclerosis with mild  tortuosity. No aortic aneurysm. The portal vein is patent. There is bi-iliac tortuosity. No bulky enlarged abdominopelvic lymph nodes. Reproductive: Globular enlarged uterus with multiple presumed fibroids, the largest appears projecting posteriorly and measures 5.6 cm. Right ovary tentatively visualized and normal. The left ovary is not definitively seen. Other: No free air or free fluid. Musculoskeletal: Multilevel degenerative change in the spine. Vacuum phenomena at L4-L5 and L5-S1. Prominent facet hypertrophy. Modic endplate changes at C94-B09. There are no acute or suspicious osseous abnormalities. IMPRESSION: 1. Cholelithiasis without gallbladder inflammation. No biliary dilatation. 2. Coarse calcification in the pancreatic head consistent with chronic pancreatitis. There are at least 3 cystic lesions in the pancreatic head/uncinate process, largest measuring 2.2 cm. These may represent sequela of prior pancreatitis and pseudocysts, however recommend further evaluation with pancreatic protocol MRI for further characterization. This should ideally be performed on an elective basis  after resolution of acute event when patient is able to tolerate breath hold technique. 3. Indeterminate 17 x 18 mm lesion arising from the upper right kidney has internal heterogeneity and peripheral calcifications. This likely represents a Bosniak 9F renal cyst, but is not definitively characterized on the current exam. Recommend MRI characterization of this lesion. There are additional simple cysts as well as parapelvic cysts in both kidneys. 4. Globular enlarged uterus with multiple presumed fibroids, the largest measuring 5.6 cm. 5. Small hiatal hernia with mild distal esophageal wall thickening, can be seen with reflux or esophagitis. 6. Colonic diverticulosis without acute inflammation. Aortic Atherosclerosis (ICD10-I70.0). Electronically Signed   By: Keith Rake M.D.   On: 04/20/2020 23:14   MR 3D Recon At Scanner  Result Date: 04/22/2020 CLINICAL DATA:  Pancreatitis suspected, characterize pancreatic lesions, characterize incidental right renal lesion EXAM: MRI ABDOMEN WITHOUT AND WITH CONTRAST (INCLUDING MRCP) TECHNIQUE: Multiplanar multisequence MR imaging of the abdomen was performed both before and after the administration of intravenous contrast. Heavily T2-weighted images of the biliary and pancreatic ducts were obtained, and three-dimensional MRCP images were rendered by post processing. CONTRAST:  20mL GADAVIST GADOBUTROL 1 MMOL/ML IV SOLN COMPARISON:  CT abdomen pelvis, 04/20/2020 FINDINGS: Lower chest: Small right pleural effusion associated atelectasis or consolidation. Trace left pleural effusion. Hepatobiliary: No mass or other parenchymal abnormality identified. Gallstones in the gallbladder. No gallbladder wall thickening. No biliary ductal dilatation. Pancreas: There is a multicystic fluid signal lesion in the anterior pancreatic head measuring 4.1 x 2.6 x 3.0 cm (series 5, image 26, series 4, image 12). There are multiple associated small calcifications. This is clearly distinct  from the pancreatic duct and there is no pancreatic ductal dilatation. No solid contrast enhancing component. Spleen:  Within normal limits in size and appearance. Adrenals/Urinary Tract: No masses identified. Numerous bilateral renal cysts, predominantly parapelvic although also with several cortical cysts. An exophytic cyst of the superior pole of the right kidney is partially calcified and demonstrates mixed intrinsic signal, but demonstrates no contrast enhancement. Remaining cysts are simple and fluid signal without enhancement. No evidence of hydronephrosis. Stomach/Bowel: There is gastric mucosal thickening of the antrum and pylorus and a possible small ulceration of the anterior gastric antrum (series 5, image 22). Vascular/Lymphatic: No pathologically enlarged lymph nodes identified. No abdominal aortic aneurysm demonstrated. Other:  There is new moderate volume ascites and pneumoperitoneum. Musculoskeletal: No suspicious bone lesions identified. IMPRESSION: 1. There is new moderate volume ascites throughout the abdomen and pneumoperitoneum, concerning for perforated viscus organ. There is gastric mucosal thickening of the antrum and pylorus and a possible small ulceration of  the anterior pylorus, suggesting a perforated gastric ulceration although nidus of perforation is not definitively localized. 2. There is a multicystic fluid signal lesion in the anterior pancreatic head measuring 4.1 cm. This is clearly distinct from the pancreatic duct and there are multiple associated small calcifications. Favor pancreatic pseudocyst although mucinous cystic neoplasm is a substantial differential consideration given appearance. Given lesion size recommend consideration of EUS and FNA for diagnosis. 3. No pancreatic ductal dilatation. 4. Numerous bilateral renal cysts, predominantly parapelvic although also with several cortical cysts. An exophytic cyst of the superior pole of the right kidney is partially calcified  and demonstrates mixed intrinsic signal, but demonstrates no contrast enhancement. This is consistent with a benign proteinaceous or hemorrhagic cyst. 5. Cholelithiasis. No evidence of acute cholecystitis. No biliary ductal dilatation. These results were called by telephone at the time of interpretation on 04/22/2020 at 10:17 am to Dr. Manuella Ghazi, who verbally acknowledged these results. Electronically Signed   By: Eddie Candle M.D.   On: 04/22/2020 10:31   US Venous Img Lower Bilateral (DVT)  Result Date: 05/01/2020 CLINICAL DATA:  Recent pulmonary embolus EXAM: BILATERAL LOWER EXTREMITY VENOUS DUPLEX ULTRASOUND TECHNIQUE: Gray-scale sonography with graded compression, as well as color Doppler and duplex ultrasound were performed to evaluate the lower extremity deep venous systems from the level of the common femoral vein and including the common femoral, femoral, profunda femoral, popliteal and calf veins including the posterior tibial, peroneal and gastrocnemius veins when visible. The superficial great saphenous vein was also interrogated. Spectral Doppler was utilized to evaluate flow at rest and with distal augmentation maneuvers in the common femoral, femoral and popliteal veins. COMPARISON:  CT abdomen and pelvis including portions of lower chest April 30, 2020 FINDINGS: RIGHT LOWER EXTREMITY Common Femoral Vein: No evidence of thrombus. Normal compressibility, respiratory phasicity and response to augmentation. Saphenofemoral Junction: No evidence of thrombus. Normal compressibility and flow on color Doppler imaging. Profunda Femoral Vein: No evidence of thrombus. Normal compressibility and flow on color Doppler imaging. Femoral Vein: No evidence of thrombus. Normal compressibility, respiratory phasicity and response to augmentation. Popliteal Vein: No evidence of thrombus. Normal compressibility, respiratory phasicity and response to augmentation. Calf Veins: No evidence of thrombus. Normal compressibility  and flow on color Doppler imaging. Superficial Great Saphenous Vein: No evidence of thrombus. Normal compressibility. Venous Reflux:  None. Other Findings:  None. LEFT LOWER EXTREMITY Common Femoral Vein: No evidence of thrombus. Normal compressibility, respiratory phasicity and response to augmentation. Saphenofemoral Junction: No evidence of thrombus. Normal compressibility and flow on color Doppler imaging. Profunda Femoral Vein: No evidence of thrombus. Normal compressibility and flow on color Doppler imaging. Femoral Vein: No evidence of thrombus. Normal compressibility, respiratory phasicity and response to augmentation. Popliteal Vein: No evidence of thrombus. Normal compressibility, respiratory phasicity and response to augmentation. Calf Veins: No evidence of thrombus. Normal compressibility and flow on color Doppler imaging. Superficial Great Saphenous Vein: No evidence of thrombus. Normal compressibility. Venous Reflux:  None. Other Findings:  None. IMPRESSION: No evidence of deep venous thrombosis in either lower extremity. Electronically Signed   By: Lowella Grip III M.D.   On: 05/01/2020 10:17   DG Chest Port 1 View  Result Date: 04/30/2020 CLINICAL DATA:  Wheezing and coughing. EXAM: PORTABLE CHEST 1 VIEW COMPARISON:  One-view chest x-ray 04/22/2020 FINDINGS: Enteric tube was removed. Heart is enlarged. Pulmonary vascular congestion is again noted. Aeration and lung volumes are proved bilaterally. Chronic elevation of the right hemidiaphragm is noted. Mild degenerative changes are  noted in the cervical spine and shoulders. IMPRESSION: 1. Stable cardiomegaly and mild pulmonary vascular congestion. 2. Improved aeration and lung volumes bilaterally. Electronically Signed   By: San Morelle M.D.   On: 04/30/2020 05:12   DG Chest Port 1 View  Result Date: 04/22/2020 CLINICAL DATA:  Peri op EXAM: PORTABLE CHEST 1 VIEW COMPARISON:  04/22/2020, CT 04/20/2020, MRI 04/22/2020 FINDINGS:  Esophageal tube tip overlies the gastric outlet. New cutaneous staples over the right hemiabdomen. Low lung volumes. Borderline cardiomegaly. Patchy atelectasis at the bases. Resolution of previously noted free air beneath the right diaphragm. No pneumothorax. IMPRESSION: 1. Esophageal tube tip overlies the gastric outlet. 2. Low lung volumes with patchy basilar atelectasis. Resolution of previously noted free air beneath the right diaphragm Electronically Signed   By: Donavan Foil M.D.   On: 04/22/2020 20:08   DG CHEST PORT 1 VIEW  Result Date: 04/22/2020 CLINICAL DATA:  Pneumoperitoneum. EXAM: PORTABLE CHEST 1 VIEW COMPARISON:  04/22/2020 MR and prior studies FINDINGS: Pneumoperitoneum underlying the RIGHT hemidiaphragm is noted, identified on earlier MRI. An NG tube is present with tip overlying the distal stomach. Bilateral LOWER lung opacities/atelectasis are present. There is no evidence of pneumothorax. IMPRESSION: 1. Pneumoperitoneum underlying the RIGHT hemidiaphragm. 2. Bilateral LOWER lung opacities/atelectasis. Electronically Signed   By: Margarette Canada M.D.   On: 04/22/2020 12:43   DG Chest Port 1 View  Result Date: 04/20/2020 CLINICAL DATA:  Altered mental status. EXAM: PORTABLE CHEST 1 VIEW COMPARISON:  September 09, 2014 FINDINGS: Mild, diffuse chronic appearing increased lung markings are seen without evidence of acute infiltrate, pleural effusion or pneumothorax. The cardiac silhouette is mildly enlarged. The visualized skeletal structures are unremarkable. IMPRESSION: Chronic appearing increased lung markings without evidence of acute or active cardiopulmonary disease. Electronically Signed   By: Virgina Norfolk M.D.   On: 04/20/2020 21:05   DG UGI W SINGLE CM (SOL OR THIN BA)  Result Date: 04/25/2020 CLINICAL DATA:  Perforated prepyloric ulcer due to naproxen, post repair EXAM: WATER SOLUBLE UPPER GI SERIES TECHNIQUE: Single-column upper GI series was performed using water soluble  contrast. CONTRAST:  135mL ISOVUE-300 IOPAMIDOL (ISOVUE-300) INJECTION 61% orally COMPARISON:  CT abdomen and pelvis 04/20/2020 FLUOROSCOPY TIME:  Fluoroscopy Time:  1 minutes 18 seconds Radiation Exposure Index (if provided by the fluoroscopic device): 49.5 mGy Number of Acquired Spot Images: 8 + multiple fluoroscopic screen captures FINDINGS: Scout image demonstrates tip of nasogastric tube projecting over stomach. JP drain projects over duodenal bulb, pylorus, and prepyloric region. Skin clips from upper abdominal laparotomy. Gaseous distention of stomach. Contrast opacifies the gastric lumen and with patient turned to RIGHT lateral decubitus position, empties from the stomach into duodenum. No evidence of gastric outlet obstruction. Mild bowel wall thickening of the pylorus, duodenal bulb and pre pyloric mucosa. No extraluminal contrast extravasation identified. No contrast is seen along the JP drain. IMPRESSION: Edema at the surgical region at the pylorus, pre pyloric, and duodenal bulb regions. No evidence of contrast extravasation. Electronically Signed   By: Lavonia Dana M.D.   On: 04/25/2020 10:11   MR ABDOMEN MRCP W WO CONTAST  Result Date: 04/22/2020 CLINICAL DATA:  Pancreatitis suspected, characterize pancreatic lesions, characterize incidental right renal lesion EXAM: MRI ABDOMEN WITHOUT AND WITH CONTRAST (INCLUDING MRCP) TECHNIQUE: Multiplanar multisequence MR imaging of the abdomen was performed both before and after the administration of intravenous contrast. Heavily T2-weighted images of the biliary and pancreatic ducts were obtained, and three-dimensional MRCP images were rendered by post processing.  CONTRAST:  75mL GADAVIST GADOBUTROL 1 MMOL/ML IV SOLN COMPARISON:  CT abdomen pelvis, 04/20/2020 FINDINGS: Lower chest: Small right pleural effusion associated atelectasis or consolidation. Trace left pleural effusion. Hepatobiliary: No mass or other parenchymal abnormality identified. Gallstones in  the gallbladder. No gallbladder wall thickening. No biliary ductal dilatation. Pancreas: There is a multicystic fluid signal lesion in the anterior pancreatic head measuring 4.1 x 2.6 x 3.0 cm (series 5, image 26, series 4, image 12). There are multiple associated small calcifications. This is clearly distinct from the pancreatic duct and there is no pancreatic ductal dilatation. No solid contrast enhancing component. Spleen:  Within normal limits in size and appearance. Adrenals/Urinary Tract: No masses identified. Numerous bilateral renal cysts, predominantly parapelvic although also with several cortical cysts. An exophytic cyst of the superior pole of the right kidney is partially calcified and demonstrates mixed intrinsic signal, but demonstrates no contrast enhancement. Remaining cysts are simple and fluid signal without enhancement. No evidence of hydronephrosis. Stomach/Bowel: There is gastric mucosal thickening of the antrum and pylorus and a possible small ulceration of the anterior gastric antrum (series 5, image 22). Vascular/Lymphatic: No pathologically enlarged lymph nodes identified. No abdominal aortic aneurysm demonstrated. Other:  There is new moderate volume ascites and pneumoperitoneum. Musculoskeletal: No suspicious bone lesions identified. IMPRESSION: 1. There is new moderate volume ascites throughout the abdomen and pneumoperitoneum, concerning for perforated viscus organ. There is gastric mucosal thickening of the antrum and pylorus and a possible small ulceration of the anterior pylorus, suggesting a perforated gastric ulceration although nidus of perforation is not definitively localized. 2. There is a multicystic fluid signal lesion in the anterior pancreatic head measuring 4.1 cm. This is clearly distinct from the pancreatic duct and there are multiple associated small calcifications. Favor pancreatic pseudocyst although mucinous cystic neoplasm is a substantial differential consideration  given appearance. Given lesion size recommend consideration of EUS and FNA for diagnosis. 3. No pancreatic ductal dilatation. 4. Numerous bilateral renal cysts, predominantly parapelvic although also with several cortical cysts. An exophytic cyst of the superior pole of the right kidney is partially calcified and demonstrates mixed intrinsic signal, but demonstrates no contrast enhancement. This is consistent with a benign proteinaceous or hemorrhagic cyst. 5. Cholelithiasis. No evidence of acute cholecystitis. No biliary ductal dilatation. These results were called by telephone at the time of interpretation on 04/22/2020 at 10:17 am to Dr. Manuella Ghazi, who verbally acknowledged these results. Electronically Signed   By: Eddie Candle M.D.   On: 04/22/2020 10:31    Labs:  CBC: Recent Labs    04/26/20 0803 04/28/20 0705 04/30/20 0906 05/01/20 0534  WBC 13.2* 13.7* 19.5* 17.0*  HGB 11.3* 11.2* 12.2 10.8*  HCT 35.0* 34.3* 37.1 32.8*  PLT 145* 167 200 198    COAGS: Recent Labs    05/01/20 0818  INR 1.2    BMP: Recent Labs    04/25/20 0545 04/26/20 0803 04/28/20 0705 04/30/20 0906  NA 136 137 134* 136  K 3.7 3.4* 3.0* 3.4*  CL 110 110 104 99  CO2 19* 20* 21* 26  GLUCOSE 161* 131* 127* 144*  BUN 13 9 6* 5*  CALCIUM 7.5* 7.8* 7.7* 8.2*  CREATININE 0.77 0.65 0.64 0.71  GFRNONAA >60 >60 >60 >60  GFRAA >60 >60 >60 >60    LIVER FUNCTION TESTS: Recent Labs    04/21/20 1835 04/22/20 0629 04/23/20 0451 04/24/20 0442  BILITOT 4.3* 1.3* 0.9 0.8  AST 18 21 59* 25  ALT 9 7 22  14  ALKPHOS 37* 27* 26* 23*  PROT 7.8 6.4* 6.4* 5.3*  ALBUMIN 4.5 3.8 3.2* 2.5*    TUMOR MARKERS: No results for input(s): AFPTM, CEA, CA199, CHROMGRNA in the last 8760 hours.  Assessment and Plan: Intra-abdominal fluid collection Patient POD #8 from perforated gastric ulcer repair.  Progressing well with noted shortness of breath while working with PT, "[place]." Now with pelvic  fluid collection and newly identified saddle PE.  Lovenox held for procedure today in anticipation of drain placement with plans to initiate heparin therapy once intervention complete.   Patient arrives to Radiology today in stable condition.  She is on 2L [place] with O2 sats 98%, HR 88.  States she has no shortness of breath at rest.   WBC 17.0 INR 1.2  Risks and benefits discussed with the patient including bleeding, infection, damage to adjacent structures, bowel perforation/fistula connection, and sepsis.  All of the patient's questions were answered, patient is agreeable to proceed. Consent signed and in chart.  Thank you for this interesting consult.  I greatly enjoyed meeting Vear Staton and look forward to participating in their care.  A copy of this report was sent to the requesting provider on this date.  Electronically Signed: Docia Barrier, PA 05/01/2020, 1:02 PM   I spent a total of 40 Minutes    in face to face in clinical consultation, greater than 50% of which was counseling/coordinating care for intra-abdominal fluid collection.

## 2020-05-01 NOTE — Progress Notes (Addendum)
ANTICOAGULATION CONSULT NOTE - Initial Consult  Pharmacy Consult for heparin IV Indication: pulmonary embolus  Allergies  Allergen Reactions  . Wheat Extract Swelling    Patient Measurements: Height: 5\' 4"  (162.6 cm) Weight: 102 kg (224 lb 13.9 oz) IBW/kg (Calculated) : 54.7 Heparin Dosing Weight: 75 kg  Vital Signs: Temp: 98.9 F (37.2 C) (09/08 0858) Temp Source: Oral (09/08 0541) BP: 134/76 (09/08 1415) Pulse Rate: 97 (09/08 1415)  Labs: Recent Labs    04/30/20 0906 05/01/20 0534 05/01/20 0818  HGB 12.2 10.8*  --   HCT 37.1 32.8*  --   PLT 200 198  --   LABPROT  --   --  14.3  INR  --   --  1.2  CREATININE 0.71  --   --     Estimated Creatinine Clearance: 83.6 mL/min (by C-G formula based on SCr of 0.71 mg/dL).   Medical History: Past Medical History:  Diagnosis Date  . Essential hypertension, benign 01/24/2015  . Foot ulcer ([place])   . Hypertension   . Memory loss   . Skin cancer   . Vision abnormalities     Medications:  Medications Prior to Admission  Medication Sig Dispense Refill Last Dose  . acetaminophen (TYLENOL) 500 MG tablet Take 1 tablet (500 mg total) by mouth every 6 (six) hours as needed. 30 tablet 0 unknown  . citalopram (CELEXA) 20 MG tablet Take 1 tablet (20 mg total) by mouth daily. 30 tablet 0 unknown  . donepezil (ARICEPT) 10 MG tablet Take 5-10 mg by mouth in the morning and at bedtime.    unknown  . gabapentin (NEURONTIN) 300 MG capsule Take 300 mg by mouth 2 (two) times daily.    unknown  . lisinopril-hydrochlorothiazide (PRINZIDE,ZESTORETIC) 10-12.5 MG tablet Take 1 tablet by mouth daily.   unknown  . traZODone (DESYREL) 100 MG tablet Take 200 mg by mouth at bedtime.    unknown  . Ginger, Zingiber officinalis, (GINGER PO) Take 1 tablet by mouth daily.    unknown  . methocarbamol (ROBAXIN) 500 MG tablet Take 1 tablet (500 mg total) by mouth every 8 (eight) hours as needed for muscle spasms. (Patient not taking: Reported on 04/22/2020)  20 tablet 0 Not Taking at Unknown time  . naproxen (NAPROSYN) 500 MG tablet Take 1 tablet (500 mg total) by mouth 2 (two) times daily as needed. (Patient not taking: Reported on 04/22/2020) 30 tablet 0 Not Taking at Unknown time    Assessment: Pharmacy consulted to dose heparin in patient with non-occlusive saddle emboli found in pulmonary arteries.  Patient not on anticoagulation prior to admission.  Last lovenox dose 9/7 0900.  Patient is now transferring to [facility] after IR drainage of pelvic fluid.  Per IR procedure protocol- standard bleeding risk protocol for abdominal abscess drainage.  Will start heparin 6 hours post procedure.  CBC WNL  Goal of Therapy:  Heparin level 0.3-0.7 units/ml Monitor platelets by anticoagulation protocol: Yes   Plan:  Start heparin infusion 6 hours post procedure. Give 5000 units bolus x 1 Start heparin infusion at 1300 units/hr Check anti-Xa level in 6 hours and daily while on heparin Continue to monitor H&H and platelets  Margot Ables, PharmD Clinical Pharmacist 05/01/2020 2:21 PM

## 2020-05-02 ENCOUNTER — Inpatient Hospital Stay (HOSPITAL_COMMUNITY): Payer: Medicaid Other

## 2020-05-02 DIAGNOSIS — I2692 Saddle embolus of pulmonary artery without acute cor pulmonale: Secondary | ICD-10-CM

## 2020-05-02 LAB — CBC WITH DIFFERENTIAL/PLATELET
Abs Immature Granulocytes: 0.08 10*3/uL — ABNORMAL HIGH (ref 0.00–0.07)
Basophils Absolute: 0 10*3/uL (ref 0.0–0.1)
Basophils Relative: 0 %
Eosinophils Absolute: 0.2 10*3/uL (ref 0.0–0.5)
Eosinophils Relative: 1 %
HCT: 31.2 % — ABNORMAL LOW (ref 36.0–46.0)
Hemoglobin: 10.5 g/dL — ABNORMAL LOW (ref 12.0–15.0)
Immature Granulocytes: 1 %
Lymphocytes Relative: 9 %
Lymphs Abs: 1.3 10*3/uL (ref 0.7–4.0)
MCH: 30 pg (ref 26.0–34.0)
MCHC: 33.7 g/dL (ref 30.0–36.0)
MCV: 89.1 fL (ref 80.0–100.0)
Monocytes Absolute: 1 10*3/uL (ref 0.1–1.0)
Monocytes Relative: 7 %
Neutro Abs: 11.6 10*3/uL — ABNORMAL HIGH (ref 1.7–7.7)
Neutrophils Relative %: 82 %
Platelets: 203 10*3/uL (ref 150–400)
RBC: 3.5 MIL/uL — ABNORMAL LOW (ref 3.87–5.11)
RDW: 14.4 % (ref 11.5–15.5)
WBC: 14.1 10*3/uL — ABNORMAL HIGH (ref 4.0–10.5)
nRBC: 0 % (ref 0.0–0.2)

## 2020-05-02 LAB — COMPREHENSIVE METABOLIC PANEL
ALT: 15 U/L (ref 0–44)
AST: 25 U/L (ref 15–41)
Albumin: 2 g/dL — ABNORMAL LOW (ref 3.5–5.0)
Alkaline Phosphatase: 64 U/L (ref 38–126)
Anion gap: 10 (ref 5–15)
BUN: 6 mg/dL — ABNORMAL LOW (ref 8–23)
CO2: 27 mmol/L (ref 22–32)
Calcium: 7.9 mg/dL — ABNORMAL LOW (ref 8.9–10.3)
Chloride: 98 mmol/L (ref 98–111)
Creatinine, Ser: 0.75 mg/dL (ref 0.44–1.00)
GFR calc Af Amer: >60 mL/min (ref >60)
GFR calc non Af Amer: >60 mL/min (ref >60)
Glucose, Bld: 109 mg/dL — ABNORMAL HIGH (ref 70–99)
Potassium: 4 mmol/L (ref 3.5–5.1)
Sodium: 135 mmol/L (ref 135–145)
Total Bilirubin: 0.7 mg/dL (ref 0.3–1.2)
Total Protein: 5.4 g/dL — ABNORMAL LOW (ref 6.5–8.1)

## 2020-05-02 LAB — ECHOCARDIOGRAM COMPLETE
Area-P 1/2: 5.66 cm2
Height: 64 in
S' Lateral: 3.1 cm
Weight: 3597.91 oz

## 2020-05-02 LAB — HEPARIN LEVEL (UNFRACTIONATED)
Heparin Unfractionated: 0.18 IU/mL — ABNORMAL LOW (ref 0.30–0.70)
Heparin Unfractionated: 0.34 IU/mL (ref 0.30–0.70)
Heparin Unfractionated: 0.54 IU/mL (ref 0.30–0.70)

## 2020-05-02 LAB — PHOSPHORUS: Phosphorus: 3.3 mg/dL (ref 2.5–4.6)

## 2020-05-02 MED ORDER — HEPARIN BOLUS VIA INFUSION
2000.0000 [IU] | Freq: Once | INTRAVENOUS | Status: AC
  Administered 2020-05-02: 2000 [IU] via INTRAVENOUS
  Filled 2020-05-02: qty 2000

## 2020-05-02 MED ORDER — LORAZEPAM 1 MG PO TABS
1.0000 mg | ORAL_TABLET | ORAL | Status: DC | PRN
  Administered 2020-05-02 – 2020-05-05 (×6): 1 mg via ORAL
  Filled 2020-05-02 (×6): qty 1

## 2020-05-02 NOTE — Progress Notes (Signed)
[place] for heparin Indication: pulmonary embolus  Allergies  Allergen Reactions  . Wheat Extract Swelling    Patient Measurements: Height: 5\' 4"  (162.6 cm) Weight: 102 kg (224 lb 13.9 oz) IBW/kg (Calculated) : 54.7 Heparin Dosing Weight: 75 kg  Vital Signs: Temp: 98.8 F (37.1 C) (09/09 2000) Temp Source: Oral (09/09 2000) BP: 148/83 (09/09 2000) Pulse Rate: 91 (09/09 2000)  Labs: Recent Labs    04/30/20 0906 04/30/20 0906 05/01/20 0534 05/01/20 0818 05/02/20 0130 05/02/20 1223 05/02/20 2112  HGB 12.2   < > 10.8*  --  10.5*  --   --   HCT 37.1  --  32.8*  --  31.2*  --   --   PLT 200  --  198  --  203  --   --   LABPROT  --   --   --  14.3  --   --   --   INR  --   --   --  1.2  --   --   --   HEPARINUNFRC  --   --   --   --  0.18* 0.34 0.54  CREATININE 0.71  --   --   --  0.75  --   --    < > = values in this interval not displayed.    Estimated Creatinine Clearance: 83.6 mL/min (by C-G formula based on SCr of 0.75 mg/dL).  Assessment: 85 yoF admitted with cholecystitis and septic shock s/p ex lap c/b saddle PE noted 9/8. Pharmacy to dose IV heparin. No AC PTA. -heparin level at goal at 1600 units/hr   Goal of Therapy:  Heparin level 0.3-0.7 units/ml Monitor platelets by anticoagulation protocol: Yes   Plan:  Continue heparin 1600 units/h Daily heparin level/CBC  Hildred Laser, PharmD Clinical Pharmacist **Pharmacist phone directory can now be found on [email or web address] ([email or web address]).  Listed under [employer] [facility].

## 2020-05-02 NOTE — Progress Notes (Signed)
[place] for heparin Indication: pulmonary embolus  Allergies  Allergen Reactions  . Wheat Extract Swelling    Patient Measurements: Height: 5\' 4"  (162.6 cm) Weight: 102 kg (224 lb 13.9 oz) IBW/kg (Calculated) : 54.7 Heparin Dosing Weight: 75 kg  Vital Signs: Temp: 98.4 F (36.9 C) (09/09 0823) Temp Source: Oral (09/09 0823) BP: 143/79 (09/09 1200) Pulse Rate: 71 (09/09 1200)  Labs: Recent Labs    04/30/20 0906 04/30/20 0906 05/01/20 0534 05/01/20 0818 05/02/20 0130 05/02/20 1223  HGB 12.2   < > 10.8*  --  10.5*  --   HCT 37.1  --  32.8*  --  31.2*  --   PLT 200  --  198  --  203  --   LABPROT  --   --   --  14.3  --   --   INR  --   --   --  1.2  --   --   HEPARINUNFRC  --   --   --   --  0.18* 0.34  CREATININE 0.71  --   --   --  0.75  --    < > = values in this interval not displayed.    Estimated Creatinine Clearance: 83.6 mL/min (by C-G formula based on SCr of 0.75 mg/dL).  Assessment: 62 yoF admitted with cholecystitis and septic shock s/p ex lap c/b saddle PE noted 9/8. Pharmacy to dose IV heparin. No AC PTA.  Heparin level therapeutic at 0.34 this morning, CBC is stable. Will increase infusion slightly to maintain therapeutic range (bolus given this morning).  Goal of Therapy:  Heparin level 0.3-0.7 units/ml Monitor platelets by anticoagulation protocol: Yes   Plan:  Increase heparin to 1600 units/h Confirmatory heparin level in 6h   Arrie Senate, PharmD, BCPS Clinical Pharmacist [phone] Please check AMION for all [place] numbers 05/02/2020

## 2020-05-02 NOTE — Progress Notes (Signed)
PROGRESS NOTE    Maria Murphy  NFA:[id number] DOB: [birth year] DOA: 04/21/2020 PCP: Avon Gully, MD    Brief Narrative:  Patient was transferred from [facility] to [facility], for further management multifocal/ saddle pulmonary embolism.  64 year old female, hospitalized to [facility] on 8/29, with the working diagnosis biliary pancreatitis, chronic cholecystitis/cholelithiasis.  Patient underwent further work-up with MRCP, which showed free fluid in the abdomen and pneumoperitoneum.  Patient was placed on broad-spectrum antibiotic therapy and vasopressors, she underwent emergent laparotomy.  Patient was found to have a perforation in the prepyloric area along the lesser curvature of the stomach and antrum, and a Cheree Ditto application was performed. Surgical culture positive for Enterococcus and Candida.  Patient was stabilized with antibiotic therapy, she developed worsening leukocytosis.  A CT of the abdomen pelvis on 9/7 showed a new 5.3 x 14 x 3.9 cm abscess in the pelvis, 5.1 x 2.6 x 2.0 subcapsular fluid collection along the right inferior liver, also concerning for abscess.  CT of the chest with multifocal pulmonary embolus, pulmonary emboli arising in the saddle Distribution from each main pulmonary artery with extension into multiple lower lobe point arteries bilaterally.  Positive right heart strain, consistent with submassive.   Assessment & Plan:   Active Problems:   Essential hypertension, benign   Hyperbilirubinemia   Epigastric pain   Perforated viscus   Perforated abdominal viscus   Peptic ulcer with perforation (HCC)   Hypotension   Acute saddle pulmonary embolism without acute cor pulmonale (HCC)   1. Submassive acute pulmonary embolism/ saddle with multiple bilateral embolus. Blood pressure 143 mmHg systolic, oxygenation is 93% on 2 L/min per [place]. She continue to have dyspnea but not chest pain. Pending echocardiogram report.   Will continue anticoagulation with IV heparin, if  echocardiogram with preserved RV function will transition to oral antithrombotic therapy with DOAC.   Continue telemetry and oxymetry monitoring for now.   Follow chest film with right base atelectasis.   2. Abdominal abscess pelvic and hepatic/ enterococcus and candida/ sp IR drainage. Pelvic drain 80 cc over last 24 H. Positive abdominal pain. Wbc is down to 14.1.  Continue with antibiotic therapy with Zosyn and Fluconazole, plan to continue antibiotic therapy for a total of 2 weeks, with follow up CT abdomen to confirm resolution.   3. Perforated gastric ulcer/ peritonitis/ sp laparotomy and graham application. Continue antiacid therapy with pantoprazole, patient is tolerating po well, with no nausea or vomiting.   Continue pain control with oxycodone and IV fentanyl.   4. HTN. Continue to hold on antihypertensive medications to prevent hypotension.   5. Depression. As needed lorazepam, will change from IV to po as needed.    Patient continue to be at high risk for worsening PE  Status is: Inpatient  Remains inpatient appropriate because:IV treatments appropriate due to intensity of illness or inability to take PO   Dispo: The patient is from: Home              Anticipated d/c is to: SNF              Anticipated d/c date is: 3 days              Patient currently is not medically stable to d/c.  DVT prophylaxis: IV heparin   Code Status:   full  Family Communication:  No family at the bedside       Consultants:   Surgery   IR   ID   Procedures:   Laparotomy  IR pelvic drain to abscess.   Antimicrobials:   Zosyn   Fluconazole     Subjective: Patient with positive dyspnea, no chest pain, no nausea or vomiting. Mild abdominal pain when eating.   Objective: Vitals:   05/01/20 2341 05/02/20 0200 05/02/20 0400 05/02/20 0823  BP: 132/80 (!) 143/76 137/69 (!) 144/79  Pulse: 93 94 85   Resp: 18 17 15 18   Temp: 99.2 F (37.3 C)  98.7 F (37.1 C) 98.4 F  (36.9 C)  TempSrc: Oral  Oral Oral  SpO2: 96% 96% 94% 93%  Weight:      Height:        Intake/Output Summary (Last 24 hours) at 05/02/2020 1230 Last data filed at 05/02/2020 0828 Gross per 24 hour  Intake 408.69 ml  Output 1980 ml  Net -1571.31 ml   Filed Weights   04/21/20 1352 04/22/20 0037 04/25/20 0424  Weight: 90.7 kg 90.5 kg 102 kg    Examination:   General: deconditioned  Neurology: Awake and alert, non focal  E ENT: mild  pallor, no icterus, oral mucosa moist Cardiovascular: No JVD. S1-S2 present, no S3 rhythmic, no gallops, rubs, or murmurs. Non pitting bilateral lower extremity edema. Pulmonary: decreased breath sounds bilaterally at bases, decreased air movement, no wheezing, rhonchi or rales. Gastrointestinal. Abdomen distended and but not tender to superficial palpation Skin. No rashes Musculoskeletal: no joint deformities     Data Reviewed: I have personally reviewed following labs and imaging studies  CBC: Recent Labs  Lab 04/26/20 0803 04/28/20 0705 04/30/20 0906 05/01/20 0534 05/02/20 0130  WBC 13.2* 13.7* 19.5* 17.0* 14.1*  NEUTROABS  --  10.2*  --   --  11.6*  HGB 11.3* 11.2* 12.2 10.8* 10.5*  HCT 35.0* 34.3* 37.1 32.8* 31.2*  MCV 91.1 89.1 89.8 89.4 89.1  PLT 145* 167 200 198 203   Basic Metabolic Panel: Recent Labs  Lab 04/26/20 0803 04/27/20 1045 04/28/20 0705 04/30/20 0906 05/02/20 0130  NA 137  --  134* 136 135  K 3.4*  --  3.0* 3.4* 4.0  CL 110  --  104 99 98  CO2 20*  --  21* 26 27  GLUCOSE 131*  --  127* 144* 109*  BUN 9  --  6* 5* 6*  CREATININE 0.65  --  0.64 0.71 0.75  CALCIUM 7.8*  --  7.7* 8.2* 7.9*  MG 2.0  --   --   --   --   PHOS 1.9* 2.8  --   --  3.3   GFR: Estimated Creatinine Clearance: 83.6 mL/min (by C-G formula based on SCr of 0.75 mg/dL). Liver Function Tests: Recent Labs  Lab 05/02/20 0130  AST 25  ALT 15  ALKPHOS 64  BILITOT 0.7  PROT 5.4*  ALBUMIN 2.0*   No results for input(s): LIPASE, AMYLASE  in the last 168 hours. No results for input(s): AMMONIA in the last 168 hours. Coagulation Profile: Recent Labs  Lab 05/01/20 0818  INR 1.2   Cardiac Enzymes: No results for input(s): CKTOTAL, CKMB, CKMBINDEX, TROPONINI in the last 168 hours. BNP (last 3 results) No results for input(s): PROBNP in the last 8760 hours. HbA1C: No results for input(s): HGBA1C in the last 72 hours. CBG: No results for input(s): GLUCAP in the last 168 hours. Lipid Profile: No results for input(s): CHOL, HDL, LDLCALC, TRIG, CHOLHDL, LDLDIRECT in the last 72 hours. Thyroid Function Tests: No results for input(s): TSH, T4TOTAL, FREET4, T3FREE, THYROIDAB in the last 72  hours. Anemia Panel: No results for input(s): VITAMINB12, FOLATE, FERRITIN, TIBC, IRON, RETICCTPCT in the last 72 hours.    Radiology Studies: I have reviewed all of the imaging during this hospital visit personally     Scheduled Meds: . Chlorhexidine Gluconate Cloth  6 each Topical Daily  . Gerhardt's butt cream   Topical Daily  . pantoprazole  40 mg Oral BID  . sodium chloride flush  5 mL Intracatheter Q8H   Continuous Infusions: . sodium chloride 500 mL (04/30/20 1145)  . fluconazole (DIFLUCAN) IV 400 mg (04/30/20 1147)  . heparin 1,500 Units/hr (05/02/20 0843)  . piperacillin-tazobactam (ZOSYN)  IV 3.375 g (05/02/20 4098)     LOS: 10 days        [place] Youngblood Annett Gula, MD

## 2020-05-02 NOTE — Progress Notes (Signed)
[place] for heparin Indication: pulmonary embolus  Allergies  Allergen Reactions  . Wheat Extract Swelling    Patient Measurements: Height: 5\' 4"  (162.6 cm) Weight: 102 kg (224 lb 13.9 oz) IBW/kg (Calculated) : 54.7 Heparin Dosing Weight: 75 kg  Vital Signs: Temp: 99.2 F (37.3 C) (09/08 2341) Temp Source: Oral (09/08 2341) BP: 143/76 (09/09 0200) Pulse Rate: 94 (09/09 0200)  Labs: Recent Labs    04/30/20 0906 04/30/20 0906 05/01/20 0534 05/01/20 0818 05/02/20 0130  HGB 12.2   < > 10.8*  --  10.5*  HCT 37.1  --  32.8*  --  31.2*  PLT 200  --  198  --  203  LABPROT  --   --   --  14.3  --   INR  --   --   --  1.2  --   HEPARINUNFRC  --   --   --   --  0.18*  CREATININE 0.71  --   --   --  0.75   < > = values in this interval not displayed.    Estimated Creatinine Clearance: 83.6 mL/min (by C-G formula based on SCr of 0.75 mg/dL).  Assessment: 64 y.o. female with PE for heparin  Goal of Therapy:  Heparin level 0.3-0.7 units/ml Monitor platelets by anticoagulation protocol: Yes   Plan:  Heparin 2000 units IV bolus, then increase heparin 1500 units/hr Check heparin level in 6 hours.   Phillis Knack, PharmD, BCPS     05/02/2020 3:47 AM

## 2020-05-02 NOTE — Progress Notes (Signed)
  Echocardiogram 2D Echocardiogram has been performed.  Maria Murphy 05/02/2020, 2:04 PM

## 2020-05-02 NOTE — Progress Notes (Signed)
Referring Physician(s): * No referring provider recorded for this case *  Supervising Physician: Sandi Mariscal  Patient Status:  [facility] - In-pt  Chief Complaint: Intra-abdominal fluid collection  Subjective: Patient lying in bed.  Appears more short of breath today although O2 95% on 1 L [place].  Drain in place.  No complaints related to drain.   Allergies: Wheat extract  Medications: Prior to Admission medications   Medication Sig Start Date End Date Taking? Authorizing Provider  acetaminophen (TYLENOL) 500 MG tablet Take 1 tablet (500 mg total) by mouth every 6 (six) hours as needed. 08/10/18  Yes Idol, Almyra Free, PA-C  citalopram (CELEXA) 20 MG tablet Take 1 tablet (20 mg total) by mouth daily. 08/16/19  Yes Jaynee Eagles, PA-C  donepezil (ARICEPT) 10 MG tablet Take 5-10 mg by mouth in the morning and at bedtime.  04/02/20  Yes [provider]  gabapentin (NEURONTIN) 300 MG capsule Take 300 mg by mouth 2 (two) times daily.  08/23/19  Yes [provider]  lisinopril-hydrochlorothiazide (PRINZIDE,ZESTORETIC) 10-12.5 MG tablet Take 1 tablet by mouth daily. 08/07/18  Yes [provider]  traZODone (DESYREL) 100 MG tablet Take 200 mg by mouth at bedtime.  08/02/19  Yes [provider]  celecoxib (CELEBREX) 100 MG capsule Take 1 capsule (100 mg total) by mouth 2 (two) times daily. 04/21/20   Margarita Mail, PA-C  famotidine (PEPCID) 20 MG tablet Take 1 tablet (20 mg total) by mouth 2 (two) times daily. 04/21/20   Harris, Abigail, PA-C  Ginger, Zingiber officinalis, (GINGER PO) Take 1 tablet by mouth daily.     [provider]  methocarbamol (ROBAXIN) 500 MG tablet Take 1 tablet (500 mg total) by mouth every 8 (eight) hours as needed for muscle spasms. Patient not taking: Reported on 04/22/2020 03/26/20   Ezequiel Essex, MD  naproxen (NAPROSYN) 500 MG tablet Take 1 tablet (500 mg total) by mouth 2 (two) times daily as needed. Patient not taking: Reported on  04/22/2020 03/26/20   Ezequiel Essex, MD  ondansetron (ZOFRAN) 4 MG tablet Take 1 tablet (4 mg total) by mouth every 8 (eight) hours as needed for nausea or vomiting. 04/21/20   Margarita Mail, PA-C     Vital Signs: BP (!) 143/79   Pulse 71   Temp 98.4 F (36.9 C) (Oral)   Resp 16   Ht 5\' 4"  (1.626 m)   Wt 224 lb 13.9 oz (102 kg)   SpO2 93%   BMI 38.60 kg/m   Physical Exam  NAD, alert, appears less comfortable Chest: increased work of breathing, shallow breaths Abdomen: soft, non-tender. LLQ drain in place.  Small amount of serosanguinous output. Flushes easily with return of thick material  Imaging: DG Chest 1 View  Result Date: 05/02/2020 CLINICAL DATA:  At dyspnea EXAM: CHEST  1 VIEW COMPARISON:  Portable exam 1540 hours compared to 04/30/2020 FINDINGS: Chronic elevation of RIGHT diaphragm. Enlargement of cardiac silhouette. Mediastinal contours and pulmonary vascularity normal. Persistent RIGHT basilar atelectasis. No infiltrate, pleural effusion or pneumothorax. IMPRESSION: Persistent RIGHT basilar atelectasis. Electronically Signed   By: Lavonia Dana M.D.   On: 05/02/2020 15:55   CT ANGIO CHEST PE W OR WO CONTRAST  Result Date: 05/01/2020 CLINICAL DATA:  Pulmonary embolus seen on recent abdominal CT. Recent repair of gastric ulcer. EXAM: CT ANGIOGRAPHY CHEST WITH CONTRAST TECHNIQUE: Multidetector CT imaging of the chest was performed using the standard protocol during bolus administration of intravenous contrast. Multiplanar CT image reconstructions and MIPs  were obtained to evaluate the vascular anatomy. CONTRAST:  116mL OMNIPAQUE IOHEXOL 350 MG/ML SOLN COMPARISON:  CT abdomen and pelvis including lower lung regions April 30, 2020 FINDINGS: Cardiovascular: Pulmonary emboli arise in each main pulmonary artery in a saddle type distribution with extension of pulmonary emboli into multiple lower lobe pulmonary arterial branches bilaterally. Right ventricle to left ventricle diameter  ratio is measured at 1.1 consistent with a degree of right heart strain. There is no appreciable thoracic aortic aneurysm or dissection. Visualized great vessels appear normal. No pericardial effusion or pericardial thickening is evident. Mediastinum/Nodes: Thyroid appears unremarkable. No adenopathy is evident. There is a small hiatal hernia. Lungs/Pleura: There are fairly small pleural effusions bilaterally with consolidation in each lower lobe region, somewhat more on the right than on the left. Upper lung regions are clear. No pneumothorax. Upper Abdomen: There is a mild degree of ascites. There is mild pneumoperitoneum which was present 1 day prior. Incomplete visualization of upper pole renal cystic lesions again noted with complex lesion arising from the periphery of the upper pole the right kidney, better seen on prior CT of the abdomen. Cholelithiasis again noted. Postoperative changes noted in the upper right abdomen, incompletely visualized. Air in the midline upper abdominal wall region likely of recent postoperative etiology. Musculoskeletal: Degenerative change noted in the thoracic spine. No blastic or lytic bone lesions. No appreciable chest wall lesions. Review of the MIP images confirms the above findings. IMPRESSION: 1. Again noted multifocal pulmonary embolus with pulmonary emboli arising in a saddle type distribution from each main pulmonary artery with extension into multiple lower lobe pulmonary arteries bilaterally. Positive for acute PE with CTevidence of right heart strain (RV/LV Ratio = 1.1) consistent with at least submassive (intermediate risk) PE. The presence of right heart strain has been associated with an increased risk of morbidity and mortality. 2. Airspace consolidation consistent with combination of pneumonia and compressive atelectasis in each lower lung region with fairly small pleural effusions bilaterally. 3.  No appreciable adenopathy. 4.  Small hiatal hernia again noted.  5. Pneumoperitoneum likely secondary to recent surgery. Mild ascites present. Postoperative changes in right upper quadrant again noted. Air in the upper abdominal wall is likely of recent postoperative etiology. 6.  Cholelithiasis. 7. Incomplete visualization of renal cystic lesions with complex mass arising from upper pole right kidney, better seen and characterized on recent abdominal CT examination. These results will be called to the ordering clinician or representative by the Radiologist Assistant, and communication documented in the PACS or [employer]. Electronically Signed   By: Lowella Grip III M.D.   On: 05/01/2020 11:22   CT ABDOMEN PELVIS W CONTRAST  Addendum Date: 05/01/2020   ADDENDUM REPORT: 05/01/2020 08:21 ADDENDUM: Upon further review, there are new thin non-occlusive saddle pulmonary emboli involving the left and right pulmonary arteries with some extension into both lower lobes. Dedicated CTA of the chest is recommended for further evaluation. Critical Value/emergent results were called by telephone on 05/01/2020 at 8:20 am to provider [facility], who verbally acknowledged these results. Electronically Signed   By: Titus Dubin M.D.   On: 05/01/2020 08:21   Result Date: 05/01/2020 CLINICAL DATA:  History of perforated pre-pyloric gastric ulcer status post repair 8 days ago, now with worsening epigastric pain and vomiting. EXAM: CT ABDOMEN AND PELVIS WITH CONTRAST TECHNIQUE: Multidetector CT imaging of the abdomen and pelvis was performed using the standard protocol following bolus administration of intravenous contrast. CONTRAST:  150mL OMNIPAQUE IOHEXOL 300 MG/ML  SOLN COMPARISON:  MRI abdomen dated April 22, 2020. CT abdomen pelvis dated April 20, 2020. FINDINGS: Lower chest: Unchanged small right and trace left pleural effusions with adjacent lower lobe atelectasis. Hepatobiliary: Unchanged subcentimeter low-density lesion in the hepatic dome, too small to characterize. A  small amount of perihepatic fluid with more focal 5.1 x 2.1 x 2.0 cm subcapsular fluid collection along the inferior right liver (series 2, image 31: Series 5, image 70) multiple gallstones again noted. No gallbladder wall thickening or biliary dilatation. Pancreas: Multiple cystic lesions in pancreatic head measuring up to 2.2 cm are unchanged. No ductal dilatation or surrounding inflammatory changes. Spleen: Normal in size without focal abnormality. Adrenals/Urinary Tract: Adrenal glands are unremarkable. Unchanged bilateral renal cysts, including a 1.8 cm complex cyst in the upper pole of the right kidney as characterized on recent MRI. No renal calculi or hydronephrosis. Bladder is unremarkable. Stomach/Bowel: Unchanged small hiatal hernia. Mildly pre-pyloric gastric wall thickening is likely postsurgical. There are few mildly dilated loops of jejunum in the mid and lower abdomen with gradual transition to ileum. Oral contrast reaches the terminal ileum. No bowel wall thickening or surrounding inflammatory changes. Left-sided colonic diverticulosis. Normal appendix. Vascular/Lymphatic: No significant vascular findings. No enlarged abdominal or pelvic lymph nodes. Reproductive: Unchanged fibroid uterus.  No adnexal mass. Other: New 5.3 x 14.0 x 3.9 cm (AP by transverse by CC) rim enhancing fluid collection in the pelvis anterior to the uterine fundus. There are few residual foci of pneumoperitoneum in the right upper quadrant and deep to the midline abdominal incision. Musculoskeletal: No acute or significant osseous findings. IMPRESSION: 1. New 5.3 x 14.0 x 3.9 cm abscess in the pelvis. 2. Small amount of perihepatic fluid with more focal 5.1 x 2.1 x 2.0 cm subcapsular fluid collection along the inferior right liver, also concerning for abscess. 3. Trace residual pneumoperitoneum in the right upper quadrant and deep to the midline abdominal incision, likely postsurgical. 4. Mildly dilated loops of small bowel in  the mid and lower abdomen are favored to represent ileus as oral contrast reaches the terminal ileum. 5. Unchanged cystic lesions in the pancreatic head measuring up to 2.2 cm, better evaluated on recent MRI. Please see that report for follow-up recommendations. 6. Unchanged cholelithiasis. 7. Unchanged small right and trace left pleural effusions. Electronically Signed: By: Titus Dubin M.D. On: 04/30/2020 19:04   US Venous Img Lower Bilateral (DVT)  Result Date: 05/01/2020 CLINICAL DATA:  Recent pulmonary embolus EXAM: BILATERAL LOWER EXTREMITY VENOUS DUPLEX ULTRASOUND TECHNIQUE: Gray-scale sonography with graded compression, as well as color Doppler and duplex ultrasound were performed to evaluate the lower extremity deep venous systems from the level of the common femoral vein and including the common femoral, femoral, profunda femoral, popliteal and calf veins including the posterior tibial, peroneal and gastrocnemius veins when visible. The superficial great saphenous vein was also interrogated. Spectral Doppler was utilized to evaluate flow at rest and with distal augmentation maneuvers in the common femoral, femoral and popliteal veins. COMPARISON:  CT abdomen and pelvis including portions of lower chest April 30, 2020 FINDINGS: RIGHT LOWER EXTREMITY Common Femoral Vein: No evidence of thrombus. Normal compressibility, respiratory phasicity and response to augmentation. Saphenofemoral Junction: No evidence of thrombus. Normal compressibility and flow on color Doppler imaging. Profunda Femoral Vein: No evidence of thrombus. Normal compressibility and flow on color Doppler imaging. Femoral Vein: No evidence of thrombus. Normal compressibility, respiratory phasicity and response to augmentation. Popliteal Vein: No evidence of thrombus. Normal compressibility, respiratory  phasicity and response to augmentation. Calf Veins: No evidence of thrombus. Normal compressibility and flow on color Doppler  imaging. Superficial Great Saphenous Vein: No evidence of thrombus. Normal compressibility. Venous Reflux:  None. Other Findings:  None. LEFT LOWER EXTREMITY Common Femoral Vein: No evidence of thrombus. Normal compressibility, respiratory phasicity and response to augmentation. Saphenofemoral Junction: No evidence of thrombus. Normal compressibility and flow on color Doppler imaging. Profunda Femoral Vein: No evidence of thrombus. Normal compressibility and flow on color Doppler imaging. Femoral Vein: No evidence of thrombus. Normal compressibility, respiratory phasicity and response to augmentation. Popliteal Vein: No evidence of thrombus. Normal compressibility, respiratory phasicity and response to augmentation. Calf Veins: No evidence of thrombus. Normal compressibility and flow on color Doppler imaging. Superficial Great Saphenous Vein: No evidence of thrombus. Normal compressibility. Venous Reflux:  None. Other Findings:  None. IMPRESSION: No evidence of deep venous thrombosis in either lower extremity. Electronically Signed   By: Lowella Grip III M.D.   On: 05/01/2020 10:17   DG Chest Port 1 View  Result Date: 04/30/2020 CLINICAL DATA:  Wheezing and coughing. EXAM: PORTABLE CHEST 1 VIEW COMPARISON:  One-view chest x-ray 04/22/2020 FINDINGS: Enteric tube was removed. Heart is enlarged. Pulmonary vascular congestion is again noted. Aeration and lung volumes are proved bilaterally. Chronic elevation of the right hemidiaphragm is noted. Mild degenerative changes are noted in the cervical spine and shoulders. IMPRESSION: 1. Stable cardiomegaly and mild pulmonary vascular congestion. 2. Improved aeration and lung volumes bilaterally. Electronically Signed   By: San Morelle M.D.   On: 04/30/2020 05:12   CT IMAGE GUIDED FLUID DRAIN BY CATHETER  Result Date: 05/02/2020 INDICATION: 64 year old female approximately 8 days status post open surgical repair of a perforated pre-pyloric gastric ulcer.  Recent CT imaging demonstrated new accumulation of a fluid collection in the dependent anatomic pelvis anterior to the uterus and superior to the bladder. She presents for CT-guided drain placement. EXAM: CT-guided drain placement MEDICATIONS: The patient is currently admitted to the hospital and receiving intravenous antibiotics. The antibiotics were administered within an appropriate time frame prior to the initiation of the procedure. ANESTHESIA/SEDATION: Fentanyl 100 mcg IV; Versed 2.5 mg IV Moderate Sedation Time:  27 minutes The patient was continuously monitored during the procedure by the interventional radiology nurse under my direct supervision. COMPLICATIONS: None immediate. PROCEDURE: Informed written consent was obtained from the patient after a thorough discussion of the procedural risks, benefits and alternatives. All questions were addressed. Maximal Sterile Barrier Technique was utilized including caps, mask, sterile gowns, sterile gloves, sterile drape, hand hygiene and skin antiseptic. A timeout was performed prior to the initiation of the procedure. A planning axial CT scan was performed. The fluid collection was successfully identified. There is a very small window approaching from inferior to the sigmoid colon. A skin entry site was selected and marked. The overlying skin was sterilely prepped and draped in the standard fashion using chlorhexidine skin prep. Local anesthesia was attained by infiltration with 1% lidocaine. A small dermatotomy was made. Under intermittent CT guidance, an 18 gauge trocar needle was carefully advanced under the sigmoid colon and into the fluid collection. A 0.035 wire was coiled in the fluid collection. The skin tract was dilated to 12 [place]. A Cook 12 [place] all-purpose drainage catheter was then advanced over the wire and formed. Aspiration yields approximately 30 mL of purulent appearing fluid. The fluid was sent for Gram stain and culture. The drainage  catheter was gently flushed and secured to the skin  with 0 Prolene suture and an adhesive fixation device. The patient tolerated the procedure well. IMPRESSION: Successful placement of a 12 French drainage catheter into the dependent low abdominal fluid collection. Aspiration yields 30 mL of purulent appearing fluid which was sent for Gram stain and culture. PLAN: 1. Maintain drain to JP bulb suction. 2. Flush drainage catheter with 10 mL saline at least once per shift. 3. When drain output is minimal (serous and less than 15 mL per day for at least 48 hours) in the patient is clinically improved, the drainage catheter can be removed. Signed, Criselda Peaches, MD, [facility] Electronically Signed   By: Jacqulynn Cadet M.D.   On: 05/02/2020 09:36    Labs:  CBC: Recent Labs    04/28/20 0705 04/30/20 0906 05/01/20 0534 05/02/20 0130  WBC 13.7* 19.5* 17.0* 14.1*  HGB 11.2* 12.2 10.8* 10.5*  HCT 34.3* 37.1 32.8* 31.2*  PLT 167 200 198 203    COAGS: Recent Labs    05/01/20 0818  INR 1.2    BMP: Recent Labs    04/26/20 0803 04/28/20 0705 04/30/20 0906 05/02/20 0130  NA 137 134* 136 135  K 3.4* 3.0* 3.4* 4.0  CL 110 104 99 98  CO2 20* 21* 26 27  GLUCOSE 131* 127* 144* 109*  BUN 9 6* 5* 6*  CALCIUM 7.8* 7.7* 8.2* 7.9*  CREATININE 0.65 0.64 0.71 0.75  GFRNONAA >60 >60 >60 >60  GFRAA >60 >60 >60 >60    LIVER FUNCTION TESTS: Recent Labs    04/22/20 0629 04/23/20 0451 04/24/20 0442 05/02/20 0130  BILITOT 1.3* 0.9 0.8 0.7  AST 21 59* 25 25  ALT 7 22 14 15   ALKPHOS 27* 26* 23* 64  PROT 6.4* 6.4* 5.3* 5.4*  ALBUMIN 3.8 3.2* 2.5* 2.0*    Assessment and Plan: Intra-abdominal fluid collection s/p aspiration and drainage with LLQ drain placement 9/8 by Dr. Garlon Hatchet remains in place with sero-sanguinous output.  WBC improved to 14.5 Afebrile.  Cx with NGTD.   Appears more short of breath today,  encouraged deep breaths. RN discussing with primary team.  On 1L [place].  O2 94%.   Continue current management and routine drain care.  IR following.    Electronically Signed: Docia Barrier, PA 05/02/2020, 4:34 PM   I spent a total of 15 Minutes at the the patient's bedside AND on the patient's hospital floor or unit, greater than 50% of which was counseling/coordinating care for intra-abdominal fluid collection.

## 2020-05-02 NOTE — TOC Progression Note (Addendum)
Transition of Care [facility]) - Progression Note    Patient Details  Name: [name] MRN: [id number] Date of Birth: 1956-06-15  Transition of Care [facility]) CM/SW [place], [place] Phone Number: 05/02/2020, 2:47 PM  Clinical Narrative:     CSW spoke with patient to provide bed offers. Patient wants CSW to call [facility] to see if they can accept her for SNF placement. CSW called Debbie with [facility] and she has requested to come see patient tomorrow morning. Debbie with [facility] will call CSW first thing in the morning and plan to go see patient to see if she can accept patient for SNF placement.CSW let patient know that CSW and Jackelyn Poling will see her in the morning.  Pending bed offers.  CSW will continue to follow. Expected Discharge Plan: [place] Barriers to Discharge: Continued Medical Work up  Expected Discharge Plan and Services Expected Discharge Plan: [place] In-house Referral: Clinical Social Work   Post Acute Care Choice: [place] Living arrangements for the past 2 months: Apartment                                       Social Determinants of Health (SDOH) Interventions    Readmission Risk Interventions Readmission Risk Prevention Plan 04/29/2020  Medication Screening Complete  Transportation Screening Complete  Some recent data might be hidden

## 2020-05-02 NOTE — Progress Notes (Signed)
Pt noted to be crying and when asked, reports 8-10/10 pain in her mid/right abdomen.  Vitals stable.  Emotional support given.  Ordered pain and anxiety medication administered and MD made aware.  Will continue to monitor.

## 2020-05-02 NOTE — Progress Notes (Signed)
PT Cancellation Note  Patient Details Name: Maria Murphy MRN: [id number] DOB: 1956/02/29   Cancelled Treatment:    Reason Eval/Treat Not Completed: Medical issues which prohibited therapy Pt transferred to [facility] from [facility] due to [facility] PE.  She has been started on heparin but levels are below therapeutic level.  Will hold PT today. Abran Richard, PT Acute Rehab Services Pager [phone] [facility] [place] 05/02/2020, 10:08 AM

## 2020-05-03 LAB — CBC WITH DIFFERENTIAL/PLATELET
Abs Immature Granulocytes: 0.08 10*3/uL — ABNORMAL HIGH (ref 0.00–0.07)
Basophils Absolute: 0 10*3/uL (ref 0.0–0.1)
Basophils Relative: 0 %
Eosinophils Absolute: 0.1 10*3/uL (ref 0.0–0.5)
Eosinophils Relative: 1 %
HCT: 30.8 % — ABNORMAL LOW (ref 36.0–46.0)
Hemoglobin: 10.5 g/dL — ABNORMAL LOW (ref 12.0–15.0)
Immature Granulocytes: 1 %
Lymphocytes Relative: 9 %
Lymphs Abs: 1.1 10*3/uL (ref 0.7–4.0)
MCH: 30.2 pg (ref 26.0–34.0)
MCHC: 34.1 g/dL (ref 30.0–36.0)
MCV: 88.5 fL (ref 80.0–100.0)
Monocytes Absolute: 1 10*3/uL (ref 0.1–1.0)
Monocytes Relative: 8 %
Neutro Abs: 9.9 10*3/uL — ABNORMAL HIGH (ref 1.7–7.7)
Neutrophils Relative %: 81 %
Platelets: 210 10*3/uL (ref 150–400)
RBC: 3.48 MIL/uL — ABNORMAL LOW (ref 3.87–5.11)
RDW: 14 % (ref 11.5–15.5)
WBC: 12.2 10*3/uL — ABNORMAL HIGH (ref 4.0–10.5)
nRBC: 0.2 % (ref 0.0–0.2)

## 2020-05-03 LAB — BASIC METABOLIC PANEL
Anion gap: 9 (ref 5–15)
BUN: 5 mg/dL — ABNORMAL LOW (ref 8–23)
CO2: 25 mmol/L (ref 22–32)
Calcium: 7.9 mg/dL — ABNORMAL LOW (ref 8.9–10.3)
Chloride: 101 mmol/L (ref 98–111)
Creatinine, Ser: 0.72 mg/dL (ref 0.44–1.00)
GFR calc Af Amer: >60 mL/min (ref >60)
GFR calc non Af Amer: >60 mL/min (ref >60)
Glucose, Bld: 108 mg/dL — ABNORMAL HIGH (ref 70–99)
Potassium: 3.3 mmol/L — ABNORMAL LOW (ref 3.5–5.1)
Sodium: 135 mmol/L (ref 135–145)

## 2020-05-03 LAB — HEPARIN LEVEL (UNFRACTIONATED): Heparin Unfractionated: 0.58 IU/mL (ref 0.30–0.70)

## 2020-05-03 MED ORDER — RIVAROXABAN 20 MG PO TABS: 20.0000 mg | ORAL_TABLET | Freq: Every day | ORAL | Status: DC

## 2020-05-03 MED ORDER — RIVAROXABAN 15 MG PO TABS
15.0000 mg | ORAL_TABLET | Freq: Two times a day (BID) | ORAL | Status: DC
  Administered 2020-05-03 – 2020-05-06 (×7): 15 mg via ORAL
  Filled 2020-05-03 (×7): qty 1

## 2020-05-03 NOTE — Progress Notes (Signed)
PROGRESS NOTE    Maria Murphy  ZOX:[id number] DOB: 06-05-1956 DOA: 04/21/2020 PCP: Avon Gully, MD    Brief Narrative:  Patient was transferred from [facility] to [facility], for further management multifocal/ saddle pulmonary embolism.  64 year old female, hospitalized to [facility] on 8/29, with the working diagnosis biliary pancreatitis, chronic cholecystitis/cholelithiasis.  Patient underwent further work-up with MRCP, which showed free fluid in the abdomen and pneumoperitoneum.  Patient was placed on broad-spectrum antibiotic therapy and vasopressors, she underwent emergent laparotomy.  Patient was found to have a perforation in the prepyloric area along the lesser curvature of the stomach and antrum, and a Cheree Ditto application was performed. Surgical culture positive for Enterococcus and Candida.  Patient was stabilized with antibiotic therapy, she developed worsening leukocytosis.  A CT of the abdomen pelvis on 9/7 showed a new 5.3 x 14 x 3.9 cm abscess in the pelvis, 5.1 x 2.6 x 2.0 subcapsular fluid collection along the right inferior liver, also concerning for abscess.  CT of the chest with multifocal pulmonary embolus, pulmonary emboli arising in the saddle Distribution from each main pulmonary artery with extension into multiple lower lobe point arteries bilaterally.  Positive right heart strain, consistent with submassive.  Patient has been placed on heparin drip with good toleration. Patient very weak and deconditioned.    Assessment & Plan:   Active Problems:   Essential hypertension, benign   Hyperbilirubinemia   Epigastric pain   Perforated viscus   Perforated abdominal viscus   Peptic ulcer with perforation (HCC)   Hypotension   Acute saddle pulmonary embolism without acute cor pulmonale (HCC)   1. Submassive acute pulmonary embolism/ saddle with multiple bilateral embolus. Patient continue to be hemodynamically stable, but very weak and deconditioned. Tolerating well anticoagulation  with IV heparin. Echocardiogram with preserved LV and RV function.    Will transition patient to rivaroxaban for anticoagulation. Continue with airway clearing techniques with incentive spirometer. Continue with oxymetry monitoring and supplemental 02 per [place].   2. Abdominal abscess pelvic and hepatic/ enterococcus and candida/ sp IR drainage. Pelvic drain 20 cc over last 24 H. Wbc continue trending down to 12,2 .  Tolerating well antibiotic therapy with Zosyn and Fluconazole. Will plan to repeat CT abdomen and pelvis at completion of therapy, or if worsening abdominal pain or leukocytosis may have to do it earlier.   3. Perforated gastric ulcer/ peritonitis/ sp laparotomy and graham application. Tolerating po well, will continue with  Pantoprazole and pain control with oxycodone and fentanyl.   4. HTN. Holding on antihypertensive medications,.   5. Depression. On PRN lorazepam.    Status is: Inpatient  Remains inpatient appropriate because:IV treatments appropriate due to intensity of illness or inability to take PO   Dispo: The patient is from: Home              Anticipated d/c is to: SNF              Anticipated d/c date is: 3 days              Patient currently is not medically stable to d/c.  DVT prophylaxis: rivaroxaban   Code Status:   full  Family Communication:  No family at the bedside      Consultants:   Surgery   IR   ID   Procedures:   Laparotomy   IR pelvic drain to abscess.   Antimicrobials:   Zosyn   Fluconazole     Subjective: Patient with intermittent dyspnea and chest pain,  improved with analgesics and antiacids.   Objective: Vitals:   05/03/20 0329 05/03/20 0800 05/03/20 1013 05/03/20 1200  BP: (!) 156/80 (!) 143/83  (!) 153/79  Pulse: 88 91 84 92  Resp: 20 (!) 23 18 (!) 22  Temp: 99 F (37.2 C) 98.8 F (37.1 C)  98.4 F (36.9 C)  TempSrc: Oral Oral  Oral  SpO2: 93% 92% 94% 93%  Weight:      Height:        Intake/Output  Summary (Last 24 hours) at 05/03/2020 1512 Last data filed at 05/03/2020 0800 Gross per 24 hour  Intake 582 ml  Output 2270 ml  Net -1688 ml   Filed Weights   04/21/20 1352 04/22/20 0037 04/25/20 0424  Weight: 90.7 kg 90.5 kg 102 kg    Examination:   General:  Deconditioned  Neurology: Awake and alert, non focal  E ENT: no pallor, no icterus, oral mucosa moist Cardiovascular: No JVD. S1-S2 present, rhythmic, no gallops, rubs, or murmurs. No lower extremity edema. Pulmonary: decreased breath sounds bilaterally, mild rhonchi but no wheezing.  Gastrointestinal. Abdomen soft and non tender.  Skin. No rashes Musculoskeletal: no joint deformities     Data Reviewed: I have personally reviewed following labs and imaging studies  CBC: Recent Labs  Lab 04/28/20 0705 04/30/20 0906 05/01/20 0534 05/02/20 0130 05/03/20 0242  WBC 13.7* 19.5* 17.0* 14.1* 12.2*  NEUTROABS 10.2*  --   --  11.6* 9.9*  HGB 11.2* 12.2 10.8* 10.5* 10.5*  HCT 34.3* 37.1 32.8* 31.2* 30.8*  MCV 89.1 89.8 89.4 89.1 88.5  PLT 167 200 198 203 210   Basic Metabolic Panel: Recent Labs  Lab 04/27/20 1045 04/28/20 0705 04/30/20 0906 05/02/20 0130 05/03/20 0242  NA  --  134* 136 135 135  K  --  3.0* 3.4* 4.0 3.3*  CL  --  104 99 98 101  CO2  --  21* 26 27 25   GLUCOSE  --  127* 144* 109* 108*  BUN  --  6* 5* 6* 5*  CREATININE  --  0.64 0.71 0.75 0.72  CALCIUM  --  7.7* 8.2* 7.9* 7.9*  PHOS 2.8  --   --  3.3  --    GFR: Estimated Creatinine Clearance: 83.6 mL/min (by C-G formula based on SCr of 0.72 mg/dL). Liver Function Tests: Recent Labs  Lab 05/02/20 0130  AST 25  ALT 15  ALKPHOS 64  BILITOT 0.7  PROT 5.4*  ALBUMIN 2.0*   No results for input(s): LIPASE, AMYLASE in the last 168 hours. No results for input(s): AMMONIA in the last 168 hours. Coagulation Profile: Recent Labs  Lab 05/01/20 0818  INR 1.2   Cardiac Enzymes: No results for input(s): CKTOTAL, CKMB, CKMBINDEX, TROPONINI in  the last 168 hours. BNP (last 3 results) No results for input(s): PROBNP in the last 8760 hours. HbA1C: No results for input(s): HGBA1C in the last 72 hours. CBG: No results for input(s): GLUCAP in the last 168 hours. Lipid Profile: No results for input(s): CHOL, HDL, LDLCALC, TRIG, CHOLHDL, LDLDIRECT in the last 72 hours. Thyroid Function Tests: No results for input(s): TSH, T4TOTAL, FREET4, T3FREE, THYROIDAB in the last 72 hours. Anemia Panel: No results for input(s): VITAMINB12, FOLATE, FERRITIN, TIBC, IRON, RETICCTPCT in the last 72 hours.    Radiology Studies: I have reviewed all of the imaging during this hospital visit personally     Scheduled Meds: . Chlorhexidine Gluconate Cloth  6 each Topical Daily  . Gerhardt's  butt cream   Topical Daily  . pantoprazole  40 mg Oral BID  . sodium chloride flush  5 mL Intracatheter Q8H   Continuous Infusions: . sodium chloride 500 mL (04/30/20 1145)  . fluconazole (DIFLUCAN) IV 400 mg (05/03/20 0846)  . heparin 1,600 Units/hr (05/03/20 0043)  . piperacillin-tazobactam (ZOSYN)  IV 3.375 g (05/03/20 0840)     LOS: 11 days        Keaun Schnabel Annett Gula, MD

## 2020-05-03 NOTE — TOC Progression Note (Addendum)
Transition of Care [facility]) - Progression Note    Patient Details  Name: Maria Murphy MRN: [id number] Date of Birth: [birth year]  [facility] CM/SW [place], [place] Phone Number: 05/03/2020, 5:03 PM  Clinical Narrative:     CSW spoke with Debbie with [facility] who was unable to come see patient today. Jackelyn Poling is going to come see patient between 9am and 10am tomorrow morning. CSW called patient to let her know Jackelyn Poling will be by to assess her in the morning for possible SNF placement. Patient is in agreement and is hopeful for possible SNF placement with [facility].  TOC team will continue to follow.  Expected Discharge Plan: [place] Barriers to Discharge: Continued Medical Work up  Expected Discharge Plan and Services Expected Discharge Plan: [place] In-house Referral: Clinical Social Work   Post Acute Care Choice: [place] Living arrangements for the past 2 months: Apartment                                       Social Determinants of Health (SDOH) Interventions    Readmission Risk Interventions Readmission Risk Prevention Plan 04/29/2020  Medication Screening Complete  Transportation Screening Complete  Some recent data might be hidden

## 2020-05-03 NOTE — Progress Notes (Signed)
ANTICOAGULATION + ANTIBIOTIC CONSULT NOTE - Follow Up Consult  Pharmacy Consult for Heparin;  Fluconazole Indication: pulmonary embolus; intra-abdominal infection  Allergies  Allergen Reactions  . Wheat Extract Swelling    Patient Measurements: Height: 5\' 4"  (162.6 cm) Weight: 102 kg (224 lb 13.9 oz) IBW/kg (Calculated) : 54.7 Heparin Dosing Weight: 80 kg  Vital Signs: Temp: 98.4 F (36.9 C) (09/10 1200) Temp Source: Oral (09/10 1200) BP: 153/79 (09/10 1200) Pulse Rate: 92 (09/10 1200)  Labs: Recent Labs    05/01/20 0534 05/01/20 0534 05/01/20 0818 05/02/20 0130 05/02/20 0130 05/02/20 1223 05/02/20 2112 05/03/20 0242 05/03/20 1141  HGB 10.8*   < >  --  10.5*  --   --   --  10.5*  --   HCT 32.8*  --   --  31.2*  --   --   --  30.8*  --   PLT 198  --   --  203  --   --   --  210  --   LABPROT  --   --  14.3  --   --   --   --   --   --   INR  --   --  1.2  --   --   --   --   --   --   HEPARINUNFRC  --   --   --  0.18*   < > 0.34 0.54  --  0.58  CREATININE  --   --   --  0.75  --   --   --  0.72  --    < > = values in this interval not displayed.    Estimated Creatinine Clearance: 83.6 mL/min (by C-G formula based on SCr of 0.72 mg/dL).  Assessment:  31 yoF admitted 8/29 with cholecystitis and septic shock, s/p exp lap 8/30 for erforated prepyloric gastric ulcer and peritonitis.   Pharmacy consulted to dose IV heparin for non-occlusive saddle PE and multiple bilateral emboli per CTA on 9/8. Dopplers negative for DVT. No antiboagulation PTA.   Heparin level remains therapeutic (0.58) on 1600 units/hr. CBC stable, no bleeding reported.   On day #8 Fluconazole and day#12 Zosyn for peritonitis.abdominal abscess.  8/30 culture grew Enterococcus and Candida albicans. Drain placed in IR 9/8.   9/8 abscess culture:  No growth < 24 hrs to date.  Goal of Therapy:  Heparin level 0.3-0.7 units/ml Monitor platelets by anticoagulation protocol: Yes  Appropriate  Fluconazole dose for indication   Plan:  Continue heparin drip at 1600 units/hr Daily heparin level and CBC while on heparin. Follow up for oral anticoagulation when able.  Continue Fluconazole 400 mg IV q24hrs. Also on Zosyn 3.375 gm IV q8h (each over 4 hours) Noted plan for at least 2 wks antibiotics from drain placement. Follow up final culture,  Arty Baumgartner, [place] Phone: [phone] 05/03/2020,1:20 PM

## 2020-05-03 NOTE — Progress Notes (Signed)
ANTICOAGULATION + ANTIBIOTIC CONSULT NOTE - Follow Up Consult  Pharmacy Consult for Heparin>>Rivaroxaban Indication: pulmonary embolus  Allergies  Allergen Reactions  . Wheat Extract Swelling    Patient Measurements: Height: 5\' 4"  (162.6 cm) Weight: 102 kg (224 lb 13.9 oz) IBW/kg (Calculated) : 54.7 Heparin Dosing Weight: 80 kg  Vital Signs: Temp: 98.4 F (36.9 C) (09/10 1200) Temp Source: Oral (09/10 1200) BP: 153/79 (09/10 1200) Pulse Rate: 98 (09/10 1530)  Labs: Recent Labs    05/01/20 0534 05/01/20 0534 05/01/20 0818 05/02/20 0130 05/02/20 0130 05/02/20 1223 05/02/20 2112 05/03/20 0242 05/03/20 1141  HGB 10.8*   < >  --  10.5*  --   --   --  10.5*  --   HCT 32.8*  --   --  31.2*  --   --   --  30.8*  --   PLT 198  --   --  203  --   --   --  210  --   LABPROT  --   --  14.3  --   --   --   --   --   --   INR  --   --  1.2  --   --   --   --   --   --   HEPARINUNFRC  --   --   --  0.18*   < > 0.34 0.54  --  0.58  CREATININE  --   --   --  0.75  --   --   --  0.72  --    < > = values in this interval not displayed.    Estimated Creatinine Clearance: 83.6 mL/min (by C-G formula based on SCr of 0.72 mg/dL).  Assessment:  42 yoF admitted 8/29 with cholecystitis and septic shock, s/p exp lap 8/30 for erforated prepyloric gastric ulcer and peritonitis.   Pharmacy consulted to dose IV heparin for non-occlusive saddle PE and multiple bilateral emboli per CTA on 9/8. Dopplers negative for DVT. No antiboagulation PTA. CBC stable, no bleeding reported.  MD now wishes to start rivaroxaban. Will start 15mg  BID for 3 weeks , then 20mg  daily thereafter.    Goal of Therapy:  Monitor platelets by anticoagulation protocol: Yes     Plan:  D/C heparin drip at time of first rivaroxaban dose Rivaroxaban 15mg  PO BID x 21 days, then 20mg  daily  Nyair Depaulo A. Levada Dy, PharmD, BCPS, FNKF Clinical Pharmacist [place] Please utilize Amion for appropriate phone number to reach  the unit pharmacist ([place])   05/03/2020,4:39 PM

## 2020-05-03 NOTE — Progress Notes (Signed)
Referring Physician(s): Dr. Arnoldo Morale  Supervising Physician: Jacqulynn Cadet  Patient Status:  [facility] - In-pt  Chief Complaint: Intra-abdominal fluid collection  Subjective: Patient lying in bed. Falls asleep easily.  Reports epigastric/RUQ pain.  Drain in place.  No complaints related to drain.  Has not worked with PT since transfer.   Allergies: Wheat extract  Medications: Prior to Admission medications   Medication Sig Start Date End Date Taking? Authorizing Provider  acetaminophen (TYLENOL) 500 MG tablet Take 1 tablet (500 mg total) by mouth every 6 (six) hours as needed. 08/10/18  Yes Idol, Almyra Free, PA-C  citalopram (CELEXA) 20 MG tablet Take 1 tablet (20 mg total) by mouth daily. 08/16/19  Yes Jaynee Eagles, PA-C  donepezil (ARICEPT) 10 MG tablet Take 5-10 mg by mouth in the morning and at bedtime.  04/02/20  Yes [provider]  gabapentin (NEURONTIN) 300 MG capsule Take 300 mg by mouth 2 (two) times daily.  08/23/19  Yes [provider]  lisinopril-hydrochlorothiazide (PRINZIDE,ZESTORETIC) 10-12.5 MG tablet Take 1 tablet by mouth daily. 08/07/18  Yes [provider]  traZODone (DESYREL) 100 MG tablet Take 200 mg by mouth at bedtime.  08/02/19  Yes [provider]  celecoxib (CELEBREX) 100 MG capsule Take 1 capsule (100 mg total) by mouth 2 (two) times daily. 04/21/20   Margarita Mail, PA-C  famotidine (PEPCID) 20 MG tablet Take 1 tablet (20 mg total) by mouth 2 (two) times daily. 04/21/20   Harris, Abigail, PA-C  Ginger, Zingiber officinalis, (GINGER PO) Take 1 tablet by mouth daily.     [provider]  methocarbamol (ROBAXIN) 500 MG tablet Take 1 tablet (500 mg total) by mouth every 8 (eight) hours as needed for muscle spasms. Patient not taking: Reported on 04/22/2020 03/26/20   Ezequiel Essex, MD  naproxen (NAPROSYN) 500 MG tablet Take 1 tablet (500 mg total) by mouth 2 (two) times daily as needed. Patient not taking: Reported on  04/22/2020 03/26/20   Ezequiel Essex, MD  ondansetron (ZOFRAN) 4 MG tablet Take 1 tablet (4 mg total) by mouth every 8 (eight) hours as needed for nausea or vomiting. 04/21/20   Margarita Mail, PA-C     Vital Signs: BP (!) 153/79 (BP Location: Left Arm)   Pulse 92   Temp 98.4 F (36.9 C) (Oral)   Resp (!) 22   Ht 5\' 4"  (1.626 m)   Wt 224 lb 13.9 oz (102 kg)   SpO2 93%   BMI 38.60 kg/m   Physical Exam  NAD, alert, appears less comfortable Chest: increased work of breathing Abdomen: soft. LLQ drain in place. Thick, bloody output.  Large clot noted in tubing which was stripped. Flushes with some resistance.   Imaging: DG Chest 1 View  Result Date: 05/02/2020 CLINICAL DATA:  At dyspnea EXAM: CHEST  1 VIEW COMPARISON:  Portable exam 1540 hours compared to 04/30/2020 FINDINGS: Chronic elevation of RIGHT diaphragm. Enlargement of cardiac silhouette. Mediastinal contours and pulmonary vascularity normal. Persistent RIGHT basilar atelectasis. No infiltrate, pleural effusion or pneumothorax. IMPRESSION: Persistent RIGHT basilar atelectasis. Electronically Signed   By: Lavonia Dana M.D.   On: 05/02/2020 15:55   CT ANGIO CHEST PE W OR WO CONTRAST  Result Date: 05/01/2020 CLINICAL DATA:  Pulmonary embolus seen on recent abdominal CT. Recent repair of gastric ulcer. EXAM: CT ANGIOGRAPHY CHEST WITH CONTRAST TECHNIQUE: Multidetector CT imaging of the chest was performed using the standard protocol during bolus administration of intravenous contrast. Multiplanar CT image reconstructions and MIPs  were obtained to evaluate the vascular anatomy. CONTRAST:  178mL OMNIPAQUE IOHEXOL 350 MG/ML SOLN COMPARISON:  CT abdomen and pelvis including lower lung regions April 30, 2020 FINDINGS: Cardiovascular: Pulmonary emboli arise in each main pulmonary artery in a saddle type distribution with extension of pulmonary emboli into multiple lower lobe pulmonary arterial branches bilaterally. Right ventricle to left  ventricle diameter ratio is measured at 1.1 consistent with a degree of right heart strain. There is no appreciable thoracic aortic aneurysm or dissection. Visualized great vessels appear normal. No pericardial effusion or pericardial thickening is evident. Mediastinum/Nodes: Thyroid appears unremarkable. No adenopathy is evident. There is a small hiatal hernia. Lungs/Pleura: There are fairly small pleural effusions bilaterally with consolidation in each lower lobe region, somewhat more on the right than on the left. Upper lung regions are clear. No pneumothorax. Upper Abdomen: There is a mild degree of ascites. There is mild pneumoperitoneum which was present 1 day prior. Incomplete visualization of upper pole renal cystic lesions again noted with complex lesion arising from the periphery of the upper pole the right kidney, better seen on prior CT of the abdomen. Cholelithiasis again noted. Postoperative changes noted in the upper right abdomen, incompletely visualized. Air in the midline upper abdominal wall region likely of recent postoperative etiology. Musculoskeletal: Degenerative change noted in the thoracic spine. No blastic or lytic bone lesions. No appreciable chest wall lesions. Review of the MIP images confirms the above findings. IMPRESSION: 1. Again noted multifocal pulmonary embolus with pulmonary emboli arising in a saddle type distribution from each main pulmonary artery with extension into multiple lower lobe pulmonary arteries bilaterally. Positive for acute PE with CTevidence of right heart strain (RV/LV Ratio = 1.1) consistent with at least submassive (intermediate risk) PE. The presence of right heart strain has been associated with an increased risk of morbidity and mortality. 2. Airspace consolidation consistent with combination of pneumonia and compressive atelectasis in each lower lung region with fairly small pleural effusions bilaterally. 3.  No appreciable adenopathy. 4.  Small hiatal  hernia again noted. 5. Pneumoperitoneum likely secondary to recent surgery. Mild ascites present. Postoperative changes in right upper quadrant again noted. Air in the upper abdominal wall is likely of recent postoperative etiology. 6.  Cholelithiasis. 7. Incomplete visualization of renal cystic lesions with complex mass arising from upper pole right kidney, better seen and characterized on recent abdominal CT examination. These results will be called to the ordering clinician or representative by the Radiologist Assistant, and communication documented in the PACS or [employer]. Electronically Signed   By: Lowella Grip III M.D.   On: 05/01/2020 11:22   CT ABDOMEN PELVIS W CONTRAST  Addendum Date: 05/01/2020   ADDENDUM REPORT: 05/01/2020 08:21 ADDENDUM: Upon further review, there are new thin non-occlusive saddle pulmonary emboli involving the left and right pulmonary arteries with some extension into both lower lobes. Dedicated CTA of the chest is recommended for further evaluation. Critical Value/emergent results were called by telephone on 05/01/2020 at 8:20 am to provider [facility], who verbally acknowledged these results. Electronically Signed   By: Titus Dubin M.D.   On: 05/01/2020 08:21   Result Date: 05/01/2020 CLINICAL DATA:  History of perforated pre-pyloric gastric ulcer status post repair 8 days ago, now with worsening epigastric pain and vomiting. EXAM: CT ABDOMEN AND PELVIS WITH CONTRAST TECHNIQUE: Multidetector CT imaging of the abdomen and pelvis was performed using the standard protocol following bolus administration of intravenous contrast. CONTRAST:  145mL OMNIPAQUE IOHEXOL 300 MG/ML  SOLN COMPARISON:  MRI abdomen dated April 22, 2020. CT abdomen pelvis dated April 20, 2020. FINDINGS: Lower chest: Unchanged small right and trace left pleural effusions with adjacent lower lobe atelectasis. Hepatobiliary: Unchanged subcentimeter low-density lesion in the hepatic dome, too small  to characterize. A small amount of perihepatic fluid with more focal 5.1 x 2.1 x 2.0 cm subcapsular fluid collection along the inferior right liver (series 2, image 31: Series 5, image 70) multiple gallstones again noted. No gallbladder wall thickening or biliary dilatation. Pancreas: Multiple cystic lesions in pancreatic head measuring up to 2.2 cm are unchanged. No ductal dilatation or surrounding inflammatory changes. Spleen: Normal in size without focal abnormality. Adrenals/Urinary Tract: Adrenal glands are unremarkable. Unchanged bilateral renal cysts, including a 1.8 cm complex cyst in the upper pole of the right kidney as characterized on recent MRI. No renal calculi or hydronephrosis. Bladder is unremarkable. Stomach/Bowel: Unchanged small hiatal hernia. Mildly pre-pyloric gastric wall thickening is likely postsurgical. There are few mildly dilated loops of jejunum in the mid and lower abdomen with gradual transition to ileum. Oral contrast reaches the terminal ileum. No bowel wall thickening or surrounding inflammatory changes. Left-sided colonic diverticulosis. Normal appendix. Vascular/Lymphatic: No significant vascular findings. No enlarged abdominal or pelvic lymph nodes. Reproductive: Unchanged fibroid uterus.  No adnexal mass. Other: New 5.3 x 14.0 x 3.9 cm (AP by transverse by CC) rim enhancing fluid collection in the pelvis anterior to the uterine fundus. There are few residual foci of pneumoperitoneum in the right upper quadrant and deep to the midline abdominal incision. Musculoskeletal: No acute or significant osseous findings. IMPRESSION: 1. New 5.3 x 14.0 x 3.9 cm abscess in the pelvis. 2. Small amount of perihepatic fluid with more focal 5.1 x 2.1 x 2.0 cm subcapsular fluid collection along the inferior right liver, also concerning for abscess. 3. Trace residual pneumoperitoneum in the right upper quadrant and deep to the midline abdominal incision, likely postsurgical. 4. Mildly dilated  loops of small bowel in the mid and lower abdomen are favored to represent ileus as oral contrast reaches the terminal ileum. 5. Unchanged cystic lesions in the pancreatic head measuring up to 2.2 cm, better evaluated on recent MRI. Please see that report for follow-up recommendations. 6. Unchanged cholelithiasis. 7. Unchanged small right and trace left pleural effusions. Electronically Signed: By: Titus Dubin M.D. On: 04/30/2020 19:04   US Venous Img Lower Bilateral (DVT)  Result Date: 05/01/2020 CLINICAL DATA:  Recent pulmonary embolus EXAM: BILATERAL LOWER EXTREMITY VENOUS DUPLEX ULTRASOUND TECHNIQUE: Gray-scale sonography with graded compression, as well as color Doppler and duplex ultrasound were performed to evaluate the lower extremity deep venous systems from the level of the common femoral vein and including the common femoral, femoral, profunda femoral, popliteal and calf veins including the posterior tibial, peroneal and gastrocnemius veins when visible. The superficial great saphenous vein was also interrogated. Spectral Doppler was utilized to evaluate flow at rest and with distal augmentation maneuvers in the common femoral, femoral and popliteal veins. COMPARISON:  CT abdomen and pelvis including portions of lower chest April 30, 2020 FINDINGS: RIGHT LOWER EXTREMITY Common Femoral Vein: No evidence of thrombus. Normal compressibility, respiratory phasicity and response to augmentation. Saphenofemoral Junction: No evidence of thrombus. Normal compressibility and flow on color Doppler imaging. Profunda Femoral Vein: No evidence of thrombus. Normal compressibility and flow on color Doppler imaging. Femoral Vein: No evidence of thrombus. Normal compressibility, respiratory phasicity and response to augmentation. Popliteal Vein: No evidence of thrombus. Normal compressibility, respiratory  phasicity and response to augmentation. Calf Veins: No evidence of thrombus. Normal compressibility and flow  on color Doppler imaging. Superficial Great Saphenous Vein: No evidence of thrombus. Normal compressibility. Venous Reflux:  None. Other Findings:  None. LEFT LOWER EXTREMITY Common Femoral Vein: No evidence of thrombus. Normal compressibility, respiratory phasicity and response to augmentation. Saphenofemoral Junction: No evidence of thrombus. Normal compressibility and flow on color Doppler imaging. Profunda Femoral Vein: No evidence of thrombus. Normal compressibility and flow on color Doppler imaging. Femoral Vein: No evidence of thrombus. Normal compressibility, respiratory phasicity and response to augmentation. Popliteal Vein: No evidence of thrombus. Normal compressibility, respiratory phasicity and response to augmentation. Calf Veins: No evidence of thrombus. Normal compressibility and flow on color Doppler imaging. Superficial Great Saphenous Vein: No evidence of thrombus. Normal compressibility. Venous Reflux:  None. Other Findings:  None. IMPRESSION: No evidence of deep venous thrombosis in either lower extremity. Electronically Signed   By: Lowella Grip III M.D.   On: 05/01/2020 10:17   DG Chest Port 1 View  Result Date: 04/30/2020 CLINICAL DATA:  Wheezing and coughing. EXAM: PORTABLE CHEST 1 VIEW COMPARISON:  One-view chest x-ray 04/22/2020 FINDINGS: Enteric tube was removed. Heart is enlarged. Pulmonary vascular congestion is again noted. Aeration and lung volumes are proved bilaterally. Chronic elevation of the right hemidiaphragm is noted. Mild degenerative changes are noted in the cervical spine and shoulders. IMPRESSION: 1. Stable cardiomegaly and mild pulmonary vascular congestion. 2. Improved aeration and lung volumes bilaterally. Electronically Signed   By: San Morelle M.D.   On: 04/30/2020 05:12   ECHOCARDIOGRAM COMPLETE  Result Date: 05/02/2020    ECHOCARDIOGRAM REPORT   Patient Name:   PARADISE VENSEL Date of Exam: 05/02/2020 Medical Rec #:  [id number]      Height:        64.0 in Accession #:    [id number]     Weight:       224.9 lb Date of Birth:  08/18/1956      BSA:          2.056 m Patient Age:    67 years       BP:           143/79 mmHg Patient Gender: F              HR:           71 bpm. Exam Location:  Inpatient Procedure: 2D Echo, Cardiac Doppler and Color Doppler Indications:    Pulmonary Embolus  History:        Patient has no prior history of Echocardiogram examinations.                 Risk Factors:Hypertension and Obesity. Pulmonary embolus, s/p GI                 surgery.  Sonographer:    Dustin Flock Referring Phys: [id number] [place]  1. Left ventricular ejection fraction, by estimation, is 60 to 65%. The left ventricle has normal function. The left ventricle has no regional wall motion abnormalities. Left ventricular diastolic parameters were normal. There is the interventricular septum is flattened in systole, consistent with right ventricular pressure overload.  2. Right ventricular systolic function is normal. The right ventricular size is mildly enlarged. There is moderately elevated pulmonary artery systolic pressure. The estimated right ventricular systolic pressure is 76.2 mmHg.  3. The mitral valve is normal in structure. Trivial mitral valve regurgitation. No evidence of mitral stenosis.  4.  The aortic valve is tricuspid. Aortic valve regurgitation is not visualized. No aortic stenosis is present. Comparison(s): No prior Echocardiogram. FINDINGS  Left Ventricle: Left ventricular ejection fraction, by estimation, is 60 to 65%. The left ventricle has normal function. The left ventricle has no regional wall motion abnormalities. The left ventricular internal cavity size was normal in size. There is  no left ventricular hypertrophy. The interventricular septum is flattened in systole, consistent with right ventricular pressure overload. Left ventricular diastolic parameters were normal. Right Ventricle: The right ventricular size is mildly  enlarged. No increase in right ventricular wall thickness. Right ventricular systolic function is normal. There is moderately elevated pulmonary artery systolic pressure. The tricuspid regurgitant velocity is 3.28 m/s, and with an assumed right atrial pressure of 8 mmHg, the estimated right ventricular systolic pressure is 85.0 mmHg. Left Atrium: Left atrial size was normal in size. Right Atrium: Right atrial size was normal in size. Pericardium: There is no evidence of pericardial effusion. Mitral Valve: The mitral valve is normal in structure. Trivial mitral valve regurgitation. No evidence of mitral valve stenosis. Tricuspid Valve: The tricuspid valve is normal in structure. Tricuspid valve regurgitation is trivial. No evidence of tricuspid stenosis. Aortic Valve: The aortic valve is tricuspid. Aortic valve regurgitation is not visualized. No aortic stenosis is present. Pulmonic Valve: The pulmonic valve was not well visualized. Pulmonic valve regurgitation is not visualized. Aorta: The aortic root and ascending aorta are structurally normal, with no evidence of dilitation. Venous: The inferior vena cava was not well visualized. IAS/Shunts: The atrial septum is grossly normal.  LEFT VENTRICLE PLAX 2D LVIDd:         5.10 cm  Diastology LVIDs:         3.10 cm  LV e' medial:    12.70 cm/s LV PW:         1.00 cm  LV E/e' medial:  6.7 LV IVS:        1.00 cm  LV e' lateral:   10.90 cm/s LVOT diam:     2.40 cm  LV E/e' lateral: 7.8 LV SV:         118 LV SV Index:   57 LVOT Area:     4.52 cm  RIGHT VENTRICLE RV Basal diam:  3.20 cm RV S prime:     21.30 cm/s TAPSE (M-mode): 3.8 cm LEFT ATRIUM             Index       RIGHT ATRIUM           Index LA diam:        3.70 cm 1.80 cm/m  RA Area:     15.60 cm LA Vol (A2C):   51.8 ml 25.19 ml/m RA Volume:   38.90 ml  18.92 ml/m LA Vol (A4C):   38.3 ml 18.63 ml/m LA Biplane Vol: 49.0 ml 23.83 ml/m  AORTIC VALVE LVOT Vmax:   126.00 cm/s LVOT Vmean:  84.700 cm/s LVOT VTI:     0.261 m  AORTA Ao Root diam: 2.90 cm MITRAL VALVE               TRICUSPID VALVE MV Area (PHT): 5.66 cm    TR Peak grad:   43.0 mmHg MV Decel Time: 134 msec    TR Vmax:        328.00 cm/s MV E velocity: 85.30 cm/s MV A velocity: 93.00 cm/s  SHUNTS MV E/A ratio:  0.92  Systemic VTI:  0.26 m                            Systemic Diam: 2.40 cm Buford Dresser MD Electronically signed by Buford Dresser MD Signature Date/Time: 05/02/2020/6:06:31 PM    Final    CT IMAGE GUIDED FLUID DRAIN BY CATHETER  Result Date: 05/02/2020 INDICATION: 65 year old female approximately 8 days status post open surgical repair of a perforated pre-pyloric gastric ulcer. Recent CT imaging demonstrated new accumulation of a fluid collection in the dependent anatomic pelvis anterior to the uterus and superior to the bladder. She presents for CT-guided drain placement. EXAM: CT-guided drain placement MEDICATIONS: The patient is currently admitted to the hospital and receiving intravenous antibiotics. The antibiotics were administered within an appropriate time frame prior to the initiation of the procedure. ANESTHESIA/SEDATION: Fentanyl 100 mcg IV; Versed 2.5 mg IV Moderate Sedation Time:  27 minutes The patient was continuously monitored during the procedure by the interventional radiology nurse under my direct supervision. COMPLICATIONS: None immediate. PROCEDURE: Informed written consent was obtained from the patient after a thorough discussion of the procedural risks, benefits and alternatives. All questions were addressed. Maximal Sterile Barrier Technique was utilized including caps, mask, sterile gowns, sterile gloves, sterile drape, hand hygiene and skin antiseptic. A timeout was performed prior to the initiation of the procedure. A planning axial CT scan was performed. The fluid collection was successfully identified. There is a very small window approaching from inferior to the sigmoid colon. A skin entry site was  selected and marked. The overlying skin was sterilely prepped and draped in the standard fashion using chlorhexidine skin prep. Local anesthesia was attained by infiltration with 1% lidocaine. A small dermatotomy was made. Under intermittent CT guidance, an 18 gauge trocar needle was carefully advanced under the sigmoid colon and into the fluid collection. A 0.035 wire was coiled in the fluid collection. The skin tract was dilated to 12 [place]. A Cook 12 [place] all-purpose drainage catheter was then advanced over the wire and formed. Aspiration yields approximately 30 mL of purulent appearing fluid. The fluid was sent for Gram stain and culture. The drainage catheter was gently flushed and secured to the skin with 0 Prolene suture and an adhesive fixation device. The patient tolerated the procedure well. IMPRESSION: Successful placement of a 12 French drainage catheter into the dependent low abdominal fluid collection. Aspiration yields 30 mL of purulent appearing fluid which was sent for Gram stain and culture. PLAN: 1. Maintain drain to JP bulb suction. 2. Flush drainage catheter with 10 mL saline at least once per shift. 3. When drain output is minimal (serous and less than 15 mL per day for at least 48 hours) in the patient is clinically improved, the drainage catheter can be removed. Signed, Criselda Peaches, MD, [facility] Radiology Electronically Signed   By: Jacqulynn Cadet M.D.   On: 05/02/2020 09:36    Labs:  CBC: Recent Labs    04/30/20 0906 05/01/20 0534 05/02/20 0130 05/03/20 0242  WBC 19.5* 17.0* 14.1* 12.2*  HGB 12.2 10.8* 10.5* 10.5*  HCT 37.1 32.8* 31.2* 30.8*  PLT 200 198 203 210    COAGS: Recent Labs    05/01/20 0818  INR 1.2    BMP: Recent Labs    04/28/20 0705 04/30/20 0906 05/02/20 0130 05/03/20 0242  NA 134* 136 135 135  K 3.0* 3.4* 4.0 3.3*  CL 104 99  98 101  CO2 21* 26 27 25   GLUCOSE 127* 144*  109* 108*  BUN 6* 5* 6* 5*  CALCIUM 7.7* 8.2* 7.9* 7.9*  CREATININE 0.64 0.71 0.75 0.72  GFRNONAA >60 >60 >60 >60  GFRAA >60 >60 >60 >60    LIVER FUNCTION TESTS: Recent Labs    04/22/20 0629 04/23/20 0451 04/24/20 0442 05/02/20 0130  BILITOT 1.3* 0.9 0.8 0.7  AST 21 59* 25 25  ALT 7 22 14 15   ALKPHOS 27* 26* 23* 64  PROT 6.4* 6.4* 5.3* 5.4*  ALBUMIN 3.8 3.2* 2.5* 2.0*    Assessment and Plan: Intra-abdominal fluid collection s/p aspiration and drainage with LLQ drain placement 9/8 by Dr. Garlon Hatchet remains in place with thick, bloody output.  WBC improved to 12.2 Afebrile.  Cx with NGTD.   Reports ongoing abdominal pain, shortness of breath. Unsure if being followed by surgery here.  Have reached out to primary team.   On 1L [place].  O2 94%.   Continue current management and routine drain care.  IR following.   Consider CT Abdomen Pelvis in 2-3 days if no change.   Electronically Signed: Docia Barrier, PA 05/03/2020, 2:46 PM   I spent a total of 15 Minutes at the the patient's bedside AND on the patient's hospital floor or unit, greater than 50% of which was counseling/coordinating care for intra-abdominal fluid collection.

## 2020-05-04 LAB — BASIC METABOLIC PANEL
Anion gap: 11 (ref 5–15)
BUN: 7 mg/dL — ABNORMAL LOW (ref 8–23)
CO2: 25 mmol/L (ref 22–32)
Calcium: 8 mg/dL — ABNORMAL LOW (ref 8.9–10.3)
Chloride: 99 mmol/L (ref 98–111)
Creatinine, Ser: 0.66 mg/dL (ref 0.44–1.00)
GFR calc Af Amer: >60 mL/min (ref >60)
GFR calc non Af Amer: >60 mL/min (ref >60)
Glucose, Bld: 123 mg/dL — ABNORMAL HIGH (ref 70–99)
Potassium: 3.2 mmol/L — ABNORMAL LOW (ref 3.5–5.1)
Sodium: 135 mmol/L (ref 135–145)

## 2020-05-04 LAB — CBC
HCT: 30.7 % — ABNORMAL LOW (ref 36.0–46.0)
Hemoglobin: 10.1 g/dL — ABNORMAL LOW (ref 12.0–15.0)
MCH: 28.6 pg (ref 26.0–34.0)
MCHC: 32.9 g/dL (ref 30.0–36.0)
MCV: 87 fL (ref 80.0–100.0)
Platelets: 231 10*3/uL (ref 150–400)
RBC: 3.53 MIL/uL — ABNORMAL LOW (ref 3.87–5.11)
RDW: 13.8 % (ref 11.5–15.5)
WBC: 12 10*3/uL — ABNORMAL HIGH (ref 4.0–10.5)
nRBC: 0 % (ref 0.0–0.2)

## 2020-05-04 MED ORDER — POTASSIUM CHLORIDE CRYS ER 20 MEQ PO TBCR
40.0000 meq | EXTENDED_RELEASE_TABLET | ORAL | Status: AC
  Administered 2020-05-04 (×2): 40 meq via ORAL
  Filled 2020-05-04 (×2): qty 2

## 2020-05-04 MED ORDER — FENTANYL CITRATE (PF) 100 MCG/2ML IJ SOLN
25.0000 ug | Freq: Four times a day (QID) | INTRAMUSCULAR | Status: DC | PRN
  Administered 2020-05-04: 25 ug via INTRAVENOUS
  Filled 2020-05-04: qty 2

## 2020-05-04 NOTE — Progress Notes (Signed)
Physical Therapy Treatment Patient Details Name: Maria Murphy MRN: [id number] DOB: 28-May-1956 Today's Date: 05/04/2020    History of Present Illness Pt adm to [facility] on 8/29 with biliary pancreatitis, chronic cholecystitis. Pt found to have gastric perforation and underwent emergent laparotomy for repair. On 9/7 pt found to have pelvic abscesses and drain placed. 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 Transferred to [facility] on 9/8.  PMH - HTN, foot ulcer, memory loss, obesity.    PT Comments    Pt had been followed at [facility] by PT. Pt continues to have poor mobility and require extensive assistance. Continue to recommend SNF and goals updated.    Follow Up Recommendations  SNF     Equipment Recommendations  Rolling walker with 5" wheels    Recommendations for Other Services       Precautions / Restrictions Precautions Precautions: Fall    Mobility  Bed Mobility Overal bed mobility: Needs Assistance Bed Mobility: Supine to Sit;Sit to Supine     Supine to sit: +2 for physical assistance;Mod assist;HOB elevated Sit to supine: Mod assist;+2 for physical assistance   General bed mobility comments: Assist to bring legs off of bed, elevate trunk into sitting and bring hips to EOB. Assist to lower trunk and bring legs back up into bed.  Transfers Overall transfer level: Needs assistance Equipment used: Rolling walker (2 wheeled)   Sit to Stand: +2 physical assistance;Max assist         General transfer comment: Assist to bring hips up using bed pad underneath. Unable to come fully into stand. More difficult due to pt's rt knee will not go into flexion which prevents her from being able to use that leg to push up. Performed x 2  Ambulation/Gait                 Stairs             Wheelchair Mobility    Modified Rankin (Stroke Patients Only)       Balance Overall balance assessment: Needs assistance Sitting-balance support: Feet supported;No  upper extremity supported Sitting balance-Leahy Scale: Fair Sitting balance - Comments: Sat EOB x 10 minutes   Standing balance support: During functional activity;Bilateral upper extremity supported Standing balance-Leahy Scale: Zero Standing balance comment: Unable to fully stand with +2 max assist                            Cognition Arousal/Alertness: Awake/alert Behavior During Therapy: WFL for tasks assessed/performed Overall Cognitive Status: Within Functional Limits for tasks assessed                                        Exercises      General Comments General comments (skin integrity, edema, etc.): Pt reported dizziness on second standing attempt.      Pertinent Vitals/Pain Pain Assessment: Faces Faces Pain Scale: Hurts even more Pain Location: abdomen with mobility Pain Descriptors / Indicators: Sore;Grimacing;Guarding Pain Intervention(s): Limited activity within patient's tolerance;Monitored during session;Repositioned    Home Living                      Prior Function            PT Goals (current goals can now be found in the care plan section) Acute Rehab PT Goals Patient  Stated Goal: return home able to take care of self PT Goal Formulation: With patient Time For Goal Achievement: 05/23/20 Potential to Achieve Goals: Fair Progress towards PT goals: Goals downgraded-see care plan    Frequency    Min 2X/week      PT Plan Current plan remains appropriate;Frequency needs to be updated    Co-evaluation              AM-PAC PT "6 Clicks" Mobility   Outcome Measure  Help needed turning from your back to your side while in a flat bed without using bedrails?: Total Help needed moving from lying on your back to sitting on the side of a flat bed without using bedrails?: Total Help needed moving to and from a bed to a chair (including a wheelchair)?: Total Help needed standing up from a chair using your arms  (e.g., wheelchair or bedside chair)?: Total Help needed to walk in hospital room?: Total Help needed climbing 3-5 steps with a railing? : Total 6 Click Score: 6    End of Session Equipment Utilized During Treatment: Gait belt Activity Tolerance: Patient limited by fatigue Patient left: in bed;with call bell/phone within reach;with family/visitor present Nurse Communication: Mobility status PT Visit Diagnosis: Unsteadiness on feet (R26.81);Other abnormalities of gait and mobility (R26.89);Muscle weakness (generalized) (M62.81)     Time: 1420-1440 PT Time Calculation (min) (ACUTE ONLY): 20 min  Charges:  $Therapeutic Activity: 8-22 mins                     [place] Pager [phone] Office [place] 05/04/2020, 4:00 PM

## 2020-05-04 NOTE — Plan of Care (Signed)
  Problem: Clinical Measurements: Goal: Respiratory complications will improve Outcome: Progressing Goal: Cardiovascular complication will be avoided Outcome: Progressing   Problem: Health Behavior/Discharge Planning: Goal: Ability to manage health-related needs will improve Outcome: Not Progressing   

## 2020-05-04 NOTE — TOC Progression Note (Signed)
Transition of Care [facility]) - Progression Note    Patient Details  Name: Maria Murphy MRN: [id number] Date of Birth: Mar 28, 1956  Transition of Care [facility]) CM/SW [place], [place] Phone Number: [phone] 05/04/2020, 2:39 PM  Clinical Narrative:     CSW attempted to follow up with Debbie from [facility] to ascertain if they will accept. CSW had to send a text due to VM being full. CSW awaiting a call back.  TOC team will continue to assist with discharge planning needs.   Expected Discharge Plan: [place] Barriers to Discharge: Continued Medical Work up  Expected Discharge Plan and Services Expected Discharge Plan: [place] In-house Referral: Clinical Social Work   Post Acute Care Choice: [place] Living arrangements for the past 2 months: Apartment                                       Social Determinants of Health (SDOH) Interventions    Readmission Risk Interventions Readmission Risk Prevention Plan 04/29/2020  Medication Screening Complete  Transportation Screening Complete  Some recent data might be hidden

## 2020-05-04 NOTE — Progress Notes (Signed)
PROGRESS NOTE    Maria Murphy  NWG:[id number] DOB: 1956-06-19 DOA: 04/21/2020 PCP: Avon Gully, MD    Brief Narrative:  [facility]for further management multifocal/saddlepulmonary embolism. [facility] admitted [facility] on [facility] with the working diagnosis of perforated prepyloric gastric ulcer.   64 year old female, [facility],with the working diagnosis biliary pancreatitis,chronic cholecystitis/cholelithiasis. Patient underwent further work-up with MRCP, which showed free fluid in the abdomen and pneumoperitoneum. Patient was placed on broad-spectrum antibiotic therapy and vasopressors, she underwent emergent laparotomy. Patient was found to have a perforation in the prepyloric area along the lesser curvature of the stomach and antrum, and a Cheree Ditto application was performed. Surgical culture positive for Enterococcus and Candida. Patient was stabilized with antibiotic therapy,but she developed worsening leukocytosis.  A CT of the abdomen pelvis on 9/7 showed a new 5.3 x 14 x 3.9 cm abscess in the pelvis,5.1 x 2.6 x 2.0 subcapsular fluid collection along the right inferior liver, also concerning for abscess. CT of the chest with multifocal pulmonary embolus, pulmonary emboli arising in the saddle distribution from each main pulmonary artery with extension into multiple lower lobe point arteries bilaterally. Positive right heart strain,consistent with submassive.  She underwent 12 F drain placement into the low abdominopelvic fluid collection. Culture positive for enterococcus and candida.   Patient has been placed on heparin drip with good toleration. Patient very weak and deconditioned.   Echocardiogram with preserved RV function, now transitioned to oral anticoagulation with good toleration.    Assessment & Plan:   Principal Problem:   Perforated abdominal viscus Active Problems:   Essential hypertension, benign    Hyperbilirubinemia   Epigastric pain   Perforated viscus   Peptic ulcer with perforation (HCC)   Hypotension   Acute saddle pulmonary embolism without acute cor pulmonale (HCC)   1. Submassive acute pulmonary embolism/ saddle with multiple bilateral embolus/ acute hypoxic respiratory failure.echocardiogram with preserved LV and RV function. Now transitioned to oral anticoagulation with rivaroxaban.   Oxymetry is 96% on supplemental 02, patient continue to have dyspnea. She does have poor inspiratory effort and pulmonary atelectasis, continue to encourage incentive spirometer and out of bed to chair tid with meals.   PT/OT, patient will need SNF.   2. Abdominal abscess pelvic and hepatic/ enterococcus and candida/ sp IR drainage.Pelvic drain 30 cc over last 24 H. Wbc continue trending down to 12,0 .  Continue with Zosyn and Fluconazole #12/14  3. Perforated gastric ulcer/ peritonitis/ sp laparotomy and graham application. Continue with Pantoprazole, tolerating po well, but continue to have pain, requiring fentanyl IV and oxycodone for control. No nausea or vomiting.   Consult nutrition.   4.HTN. stable blood pressure off antihypertensive medications.   5. Depression. Continue with PRN lorazepam.   6. Hypokalemia. K down to 3,2 today, stable renal function with serum cr at 0.66 with serum bicarbonate at 25. Add 80 meq KCl in 2 divided doses and will follow up on renal panel in am, including Mg.     Status is: Inpatient  Remains inpatient appropriate because:IV treatments appropriate due to intensity of illness or inability to take PO   Dispo: The patient is from: Home              Anticipated d/c is to: SNF              Anticipated d/c date is: 2 days              Patient currently is not  medically stable to d/c.   DVT prophylaxis: rivaroxaban   Code Status:   full  Family Communication:  No family at the bedside. I offer call her family but she prefers to  communicate her self with them.      Consultants:  Surgery   IR   ID  Procedures:  Laparotomy   IR pelvic drain to abscess.  Antimicrobials:  Zosyn  Fluconazole   Subjective: Patient continue to have abdominal pain, but improved with analgesics, no nausea or vomiting, dyspnea has improved but not yet back to baseline, continue to be very weak and deconditioned   Objective: Vitals:   05/04/20 0005 05/04/20 0425 05/04/20 0750 05/04/20 0901  BP: (!) 146/80 136/80 (!) 142/83   Pulse: 99 87 86 79  Resp: 16 17 17 20   Temp: 98.4 F (36.9 C) 98.1 F (36.7 C) 98.4 F (36.9 C)   TempSrc: Oral Oral Oral   SpO2: 96% 96% 96% 96%  Weight:      Height:        Intake/Output Summary (Last 24 hours) at 05/04/2020 1152 Last data filed at 05/04/2020 0624 Gross per 24 hour  Intake 360 ml  Output 1230 ml  Net -870 ml   Filed Weights   04/21/20 1352 04/22/20 0037 04/25/20 0424  Weight: 90.7 kg 90.5 kg 102 kg    Examination:   General: deconditioned, mild dyspnea at rest.  Neurology: Awake and alert, non focal  E ENT: mild pallor, no icterus, oral mucosa moist Cardiovascular: No JVD. S1-S2 present, rhythmic, no gallops, rubs, or murmurs. No lower extremity edema. Pulmonary: vesicular breath sounds bilaterally, adequate air movement, no wheezing, rhonchi or rales. Gastrointestinal. Abdomen mild distended, but not tender to superficial palpation. No rebound or guarding.  Skin. No rashes Musculoskeletal: no joint deformities     Data Reviewed: I have personally reviewed following labs and imaging studies  CBC: Recent Labs  Lab 04/28/20 0705 04/28/20 0705 04/30/20 0906 05/01/20 0534 05/02/20 0130 05/03/20 0242 05/04/20 0146  WBC 13.7*   < > 19.5* 17.0* 14.1* 12.2* 12.0*  NEUTROABS 10.2*  --   --   --  11.6* 9.9*  --   HGB 11.2*   < > 12.2 10.8* 10.5* 10.5* 10.1*  HCT 34.3*   < > 37.1 32.8* 31.2* 30.8* 30.7*  MCV 89.1   < > 89.8 89.4 89.1 88.5 87.0  PLT  167   < > 200 198 203 210 231   < > = values in this interval not displayed.   Basic Metabolic Panel: Recent Labs  Lab 04/28/20 0705 04/30/20 0906 05/02/20 0130 05/03/20 0242 05/04/20 0146  NA 134* 136 135 135 135  K 3.0* 3.4* 4.0 3.3* 3.2*  CL 104 99 98 101 99  CO2 21* 26 27 25 25   GLUCOSE 127* 144* 109* 108* 123*  BUN 6* 5* 6* 5* 7*  CREATININE 0.64 0.71 0.75 0.72 0.66  CALCIUM 7.7* 8.2* 7.9* 7.9* 8.0*  PHOS  --   --  3.3  --   --    GFR: Estimated Creatinine Clearance: 83.6 mL/min (by C-G formula based on SCr of 0.66 mg/dL). Liver Function Tests: Recent Labs  Lab 05/02/20 0130  AST 25  ALT 15  ALKPHOS 64  BILITOT 0.7  PROT 5.4*  ALBUMIN 2.0*   No results for input(s): LIPASE, AMYLASE in the last 168 hours. No results for input(s): AMMONIA in the last 168 hours. Coagulation Profile: Recent Labs  Lab 05/01/20 0818  INR  1.2   Cardiac Enzymes: No results for input(s): CKTOTAL, CKMB, CKMBINDEX, TROPONINI in the last 168 hours. BNP (last 3 results) No results for input(s): PROBNP in the last 8760 hours. HbA1C: No results for input(s): HGBA1C in the last 72 hours. CBG: No results for input(s): GLUCAP in the last 168 hours. Lipid Profile: No results for input(s): CHOL, HDL, LDLCALC, TRIG, CHOLHDL, LDLDIRECT in the last 72 hours. Thyroid Function Tests: No results for input(s): TSH, T4TOTAL, FREET4, T3FREE, THYROIDAB in the last 72 hours. Anemia Panel: No results for input(s): VITAMINB12, FOLATE, FERRITIN, TIBC, IRON, RETICCTPCT in the last 72 hours.    Radiology Studies: I have reviewed all of the imaging during this hospital visit personally     Scheduled Meds: . Chlorhexidine Gluconate Cloth  6 each Topical Daily  . Gerhardt's butt cream   Topical Daily  . pantoprazole  40 mg Oral BID  . potassium chloride  40 mEq Oral Q4H  . Rivaroxaban  15 mg Oral BID WC   Followed by  . [START ON 05/24/2020] rivaroxaban  20 mg Oral Q supper  . sodium chloride  flush  5 mL Intracatheter Q8H   Continuous Infusions: . sodium chloride 500 mL (04/30/20 1145)  . fluconazole (DIFLUCAN) IV 400 mg (05/04/20 0919)  . piperacillin-tazobactam (ZOSYN)  IV 3.375 g (05/04/20 0916)     LOS: 12 days        Maria Murin Annett Gula, MD

## 2020-05-05 LAB — CBC
HCT: 31 % — ABNORMAL LOW (ref 36.0–46.0)
Hemoglobin: 10.1 g/dL — ABNORMAL LOW (ref 12.0–15.0)
MCH: 28.7 pg (ref 26.0–34.0)
MCHC: 32.6 g/dL (ref 30.0–36.0)
MCV: 88.1 fL (ref 80.0–100.0)
Platelets: 219 10*3/uL (ref 150–400)
RBC: 3.52 MIL/uL — ABNORMAL LOW (ref 3.87–5.11)
RDW: 14 % (ref 11.5–15.5)
WBC: 9 10*3/uL (ref 4.0–10.5)
nRBC: 0 % (ref 0.0–0.2)

## 2020-05-05 LAB — BASIC METABOLIC PANEL
Anion gap: 8 (ref 5–15)
BUN: 7 mg/dL — ABNORMAL LOW (ref 8–23)
CO2: 25 mmol/L (ref 22–32)
Calcium: 8.3 mg/dL — ABNORMAL LOW (ref 8.9–10.3)
Chloride: 102 mmol/L (ref 98–111)
Creatinine, Ser: 0.77 mg/dL (ref 0.44–1.00)
GFR calc Af Amer: >60 mL/min (ref >60)
GFR calc non Af Amer: >60 mL/min (ref >60)
Glucose, Bld: 138 mg/dL — ABNORMAL HIGH (ref 70–99)
Potassium: 4 mmol/L (ref 3.5–5.1)
Sodium: 135 mmol/L (ref 135–145)

## 2020-05-05 LAB — SARS CORONAVIRUS 2 BY RT PCR (HOSPITAL ORDER, PERFORMED IN ~~LOC~~ HOSPITAL LAB): SARS Coronavirus 2: NEGATIVE

## 2020-05-05 LAB — GLUCOSE, CAPILLARY: Glucose-Capillary: 133 mg/dL — ABNORMAL HIGH (ref 70–99)

## 2020-05-05 LAB — MAGNESIUM: Magnesium: 1.8 mg/dL (ref 1.7–2.4)

## 2020-05-05 NOTE — Plan of Care (Signed)
  Problem: Clinical Measurements: Goal: Respiratory complications will improve Outcome: Progressing   

## 2020-05-05 NOTE — TOC Progression Note (Addendum)
Transition of Care [facility]) - Progression Note    Patient Details  Name: Maria Murphy MRN: [id number] Date of Birth: 12-Jan-1956  Transition of Care [facility]) CM/SW [place], [place] Phone Number: 05/05/2020, 12:03 PM  Clinical Narrative:     Update 9/12 3:39pm-CSW and Case Manager Steffanie Dunn went and saw patient at bedside to speak with her about discharge plans. Patient says that she wants to home. Patient told CSW and Case manager that she will have friends and neighbors checking in on her through out the day at her home. Case manager explained that right now she needs 24/7 supervision. Patient said she would not have someone that could be with her 24/7. Patient agreed to SNF placement with [facility] and understands that she needs 24/7 care at a SNF. CSW let patient know that we awaiting to hear back from [facility] to confirm placement and will follow up with her tomorrow once confirmed.  CSW will continue to follow.  Update 9/12 2:46pm-CSW spoke with patient and patient has changed her mind and has declined SNF. Patient wants to go home with home health services. CSW told patient she will reach out to case manager to follow up with her about home health needs.  CSW will continue to follow.  CSW spoke with patient and informed patient that CSW has not heard back from [place] with [employer] health. Patient has decided to go with [facility] for SNF placement. CSW called [facility] to confirm bed offer. CSW awaiting call back for confirmation.   CSW will continue to follow.   Expected Discharge Plan: [place] Barriers to Discharge: Continued Medical Work up  Expected Discharge Plan and Services Expected Discharge Plan: [place] In-house Referral: Clinical Social Work   Post Acute Care Choice: [place] Living arrangements for the past 2 months: Apartment                                       Social Determinants  of Health (SDOH) Interventions    Readmission Risk Interventions Readmission Risk Prevention Plan 04/29/2020  Medication Screening Complete  Transportation Screening Complete  Some recent data might be hidden

## 2020-05-05 NOTE — Progress Notes (Signed)
PROGRESS NOTE    Maria Murphy  UJW:[id number] DOB: Aug 30, 1955 DOA: 04/21/2020 PCP: Avon Gully, MD    Brief Narrative:  [facility]for further management multifocal/saddlepulmonary embolism. [facility] on [facility] with the working diagnosis of perforated prepyloric gastric ulcer.   64 year old female, [facility],with the working diagnosis biliary pancreatitis,chronic cholecystitis/cholelithiasis. Patient underwent further work-up with MRCP, which showed free fluid in the abdomen and pneumoperitoneum. Patient was placed on broad-spectrum antibiotic therapy and vasopressors, she underwent emergent laparotomy. Patient was found to have a perforation in the prepyloric area along the lesser curvature of the stomach and antrum, and a Cheree Ditto application was performed. Surgical culture positive for Enterococcus and Candida. Patient was stabilized with antibiotic therapy,but she developed worsening leukocytosis.  A CT of the abdomen pelvis on 9/7 showed a new 5.3 x 14 x 3.9 cm abscess in the pelvis,5.1 x 2.6 x 2.0 subcapsular fluid collection along the right inferior liver, also concerning for abscess. CT of the chest with multifocal pulmonary embolus, pulmonary emboli arising in the saddle distribution from each main pulmonary artery with extension into multiple lower lobe point arteries bilaterally. Positive right heart strain,consistent with submassive.  She underwent 12 F drain placement into the low abdominopelvic fluid collection. Culture positive for enterococcus and candida.   Patient has been placed on heparin drip with good toleration. Patient very weak and deconditioned.  Echocardiogram with preserved RV function, now transitioned to oral anticoagulation with good toleration.   Transitioned to oral anticoagulation with good toleration, patient continue to be very weak and deconditioned, plan to transfer to  SNF.    Assessment & Plan:   Principal Problem:   Perforated abdominal viscus Active Problems:   Essential hypertension, benign   Hyperbilirubinemia   Epigastric pain   Perforated viscus   Peptic ulcer with perforation (HCC)   Hypotension   Acute saddle pulmonary embolism without acute cor pulmonale (HCC)   1. Submassive acute pulmonary embolism/ saddle with multiple bilateral embolus/ acute hypoxic respiratory failure.echocardiogram with preserved LV and RV function. Continue to be hemodynamically stable, and oxygenating well. Working with Facilities manager.   Continue anticoagulation with rivaroxaban. Ok to discontinue telemetry. Mobility per PT and OT, patient will need SNF.   2. Abdominal abscess pelvic and hepatic/ enterococcus and candida/ sp IR drainage.Pelvic draindecreasing down to 10 cc over last 24 H.leukocytosis has resolved, today wbc down to 9,0/  . Antibiotic therapy with Zosyn and Fluconazole #13/14  3. Perforated gastric ulcer/ peritonitis/ sp laparotomy and graham application.OnPantoprazole, with improved pain control, continue with oxycodone and IV fentanyl.  Improved po intake with no nausea or vomiting.   Nutritional supplements.   4.HTN.Continue to hold on antihypertensive medications. Blood pressure is 136/71 this am.   5. Depression.On PRNlorazepam.  6. Hypokalemia. Stable renal function with serum cr at 0.77 and K at 4,0 ,bicarbonate at 25 and Mg at 1,8.  Add 2 g Mg sulfate. Patient is tolerating po well.      Status is: Inpatient  Remains inpatient appropriate because:Unsafe d/c plan and Inpatient level of care appropriate due to severity of illness   Dispo: The patient is from: Home              Anticipated d/c is to: SNF              Anticipated d/c date is: 1 day              Patient currently is medically stable  to d/c.   DVT prophylaxis: rivaroxaban   Code Status:   full  Family Communication:  No family at the  bedside     Consultants:  Surgery   IR   ID  Procedures:  Laparotomy   IR pelvic drain to abscess.  Antimicrobials:  Zosyn  Fluconazole  Subjective: Patient continue to be very weak and deconditioned, no nausea or vomiting, no chest pain or dyspnea. Abdominal pain improved with analgesics.   Objective: Vitals:   05/04/20 1952 05/05/20 0017 05/05/20 0355 05/05/20 0831  BP: 136/88 134/84 119/77 136/71  Pulse: 97 94 87 86  Resp: 20 (!) 23 17 20   Temp: 98.8 F (37.1 C) 98.9 F (37.2 C) 98.3 F (36.8 C) 99 F (37.2 C)  TempSrc: Oral Oral Oral Oral  SpO2: 96% 95% 96% 95%  Weight:      Height:        Intake/Output Summary (Last 24 hours) at 05/05/2020 0845 Last data filed at 05/05/2020 0611 Gross per 24 hour  Intake 529.95 ml  Output 2010 ml  Net -1480.05 ml   Filed Weights   04/21/20 1352 04/22/20 0037 04/25/20 0424  Weight: 90.7 kg 90.5 kg 102 kg    Examination:   General: Not in pain or dyspnea, deconditioned.  Neurology: Awake and alert, non focal  E ENT: no pallor, no icterus, oral mucosa moist Cardiovascular: No JVD. S1-S2 present, rhythmic, no gallops, rubs, or murmurs. No lower extremity edema. Pulmonary: positive breath sounds bilaterally, adequate air movement, no wheezing, rhonchi or rales. Gastrointestinal. Abdomen soft and non tender. Drain in place.  Skin. No rashes Musculoskeletal: no joint deformities     Data Reviewed: I have personally reviewed following labs and imaging studies  CBC: Recent Labs  Lab 05/01/20 0534 05/02/20 0130 05/03/20 0242 05/04/20 0146 05/05/20 0332  WBC 17.0* 14.1* 12.2* 12.0* 9.0  NEUTROABS  --  11.6* 9.9*  --   --   HGB 10.8* 10.5* 10.5* 10.1* 10.1*  HCT 32.8* 31.2* 30.8* 30.7* 31.0*  MCV 89.4 89.1 88.5 87.0 88.1  PLT 198 203 210 231 219   Basic Metabolic Panel: Recent Labs  Lab 04/30/20 0906 05/02/20 0130 05/03/20 0242 05/04/20 0146 05/05/20 0332  NA 136 135 135 135 135  K 3.4* 4.0  3.3* 3.2* 4.0  CL 99 98 101 99 102  CO2 26 27 25 25 25   GLUCOSE 144* 109* 108* 123* 138*  BUN 5* 6* 5* 7* 7*  CREATININE 0.71 0.75 0.72 0.66 0.77  CALCIUM 8.2* 7.9* 7.9* 8.0* 8.3*  MG  --   --   --   --  1.8  PHOS  --  3.3  --   --   --    GFR: Estimated Creatinine Clearance: 83.6 mL/min (by C-G formula based on SCr of 0.77 mg/dL). Liver Function Tests: Recent Labs  Lab 05/02/20 0130  AST 25  ALT 15  ALKPHOS 64  BILITOT 0.7  PROT 5.4*  ALBUMIN 2.0*   No results for input(s): LIPASE, AMYLASE in the last 168 hours. No results for input(s): AMMONIA in the last 168 hours. Coagulation Profile: Recent Labs  Lab 05/01/20 0818  INR 1.2   Cardiac Enzymes: No results for input(s): CKTOTAL, CKMB, CKMBINDEX, TROPONINI in the last 168 hours. BNP (last 3 results) No results for input(s): PROBNP in the last 8760 hours. HbA1C: No results for input(s): HGBA1C in the last 72 hours. CBG: No results for input(s): GLUCAP in the last 168 hours. Lipid Profile: No  results for input(s): CHOL, HDL, LDLCALC, TRIG, CHOLHDL, LDLDIRECT in the last 72 hours. Thyroid Function Tests: No results for input(s): TSH, T4TOTAL, FREET4, T3FREE, THYROIDAB in the last 72 hours. Anemia Panel: No results for input(s): VITAMINB12, FOLATE, FERRITIN, TIBC, IRON, RETICCTPCT in the last 72 hours.    Radiology Studies: I have reviewed all of the imaging during this hospital visit personally     Scheduled Meds: . Chlorhexidine Gluconate Cloth  6 each Topical Daily  . Gerhardt's butt cream   Topical Daily  . pantoprazole  40 mg Oral BID  . Rivaroxaban  15 mg Oral BID WC   Followed by  . [START ON 05/24/2020] rivaroxaban  20 mg Oral Q supper  . sodium chloride flush  5 mL Intracatheter Q8H   Continuous Infusions: . sodium chloride 500 mL (04/30/20 1145)  . fluconazole (DIFLUCAN) IV 400 mg (05/04/20 0919)  . piperacillin-tazobactam (ZOSYN)  IV 3.375 g (05/05/20 0016)     LOS: 13 days         Maria Dziuba Annett Gula, MD

## 2020-05-06 ENCOUNTER — Inpatient Hospital Stay (HOSPITAL_COMMUNITY): Payer: Medicaid Other

## 2020-05-06 DIAGNOSIS — I959 Hypotension, unspecified: Secondary | ICD-10-CM

## 2020-05-06 LAB — AEROBIC/ANAEROBIC CULTURE W GRAM STAIN (SURGICAL/DEEP WOUND)

## 2020-05-06 MED ORDER — PIPERACILLIN-TAZOBACTAM 3.375 G IVPB 30 MIN
3.3750 g | Freq: Once | INTRAVENOUS | Status: AC
  Administered 2020-05-06: 3.375 g via INTRAVENOUS
  Filled 2020-05-06: qty 50

## 2020-05-06 MED ORDER — PANTOPRAZOLE SODIUM 40 MG PO TBEC: 40.0000 mg | DELAYED_RELEASE_TABLET | Freq: Two times a day (BID) | ORAL | 0 refills | Status: DC

## 2020-05-06 MED ORDER — PANTOPRAZOLE SODIUM 40 MG IV SOLR
40.0000 mg | Freq: Two times a day (BID) | INTRAVENOUS | Status: DC
  Administered 2020-05-07 – 2020-05-13 (×14): 40 mg via INTRAVENOUS
  Filled 2020-05-06 (×14): qty 40

## 2020-05-06 MED ORDER — RIVAROXABAN 20 MG PO TABS
20.0000 mg | ORAL_TABLET | Freq: Every day | ORAL | 0 refills | Status: DC
Start: 2020-05-24 — End: 2020-07-12

## 2020-05-06 MED ORDER — PIPERACILLIN-TAZOBACTAM 3.375 G IVPB
3.3750 g | Freq: Three times a day (TID) | INTRAVENOUS | Status: DC
  Administered 2020-05-07 – 2020-05-13 (×20): 3.375 g via INTRAVENOUS
  Filled 2020-05-06 (×20): qty 50

## 2020-05-06 MED ORDER — RIVAROXABAN 15 MG PO TABS: 15.0000 mg | ORAL_TABLET | Freq: Two times a day (BID) | ORAL | 0 refills | Status: DC

## 2020-05-06 MED ORDER — OXYCODONE HCL 5 MG PO TABS
5.0000 mg | ORAL_TABLET | Freq: Four times a day (QID) | ORAL | 0 refills | Status: DC | PRN
Start: 2020-05-06 — End: 2020-07-23

## 2020-05-06 MED ORDER — IOHEXOL 300 MG/ML  SOLN
100.0000 mL | Freq: Once | INTRAMUSCULAR | Status: AC | PRN
  Administered 2020-05-06: 100 mL via INTRAVENOUS

## 2020-05-06 MED ORDER — PIPERACILLIN-TAZOBACTAM 3.375 G IVPB 30 MIN: 3.3750 g | Freq: Three times a day (TID) | INTRAVENOUS | Status: DC

## 2020-05-06 MED ORDER — LACTATED RINGERS IV SOLN: INTRAVENOUS | Status: DC

## 2020-05-06 MED ORDER — LORAZEPAM 2 MG/ML IJ SOLN
1.0000 mg | Freq: Four times a day (QID) | INTRAMUSCULAR | Status: DC | PRN
  Administered 2020-05-09 – 2020-05-16 (×8): 1 mg via INTRAVENOUS
  Filled 2020-05-06 (×8): qty 1

## 2020-05-06 MED ORDER — LORAZEPAM 1 MG PO TABS: 1.0000 mg | ORAL_TABLET | Freq: Four times a day (QID) | ORAL | 0 refills | Status: DC | PRN

## 2020-05-06 MED ORDER — FLUCONAZOLE IN SODIUM CHLORIDE 400-0.9 MG/200ML-% IV SOLN
400.0000 mg | INTRAVENOUS | Status: AC
  Administered 2020-05-07 – 2020-05-13 (×7): 400 mg via INTRAVENOUS
  Filled 2020-05-06 (×7): qty 200

## 2020-05-06 NOTE — Progress Notes (Signed)
ANTICOAGULATION CONSULT NOTE - Follow Up Consult  Pharmacy Consult for Xarelto Indication: pulmonary embolus  Allergies  Allergen Reactions  . Wheat Extract Swelling    Patient Measurements: Height: 5\' 4"  (162.6 cm) Weight: 102 kg (224 lb 13.9 oz) IBW/kg (Calculated) : 54.7  Vital Signs: Temp: 98.2 F (36.8 C) (09/13 0337) Temp Source: Oral (09/13 0337) BP: 132/91 (09/13 0337) Pulse Rate: 92 (09/13 0337)  Labs: Recent Labs    05/03/20 1141 05/04/20 0146 05/05/20 0332  HGB  --  10.1* 10.1*  HCT  --  30.7* 31.0*  PLT  --  231 219  HEPARINUNFRC 0.58  --   --   CREATININE  --  0.66 0.77    Estimated Creatinine Clearance: 83.6 mL/min (by C-G formula based on SCr of 0.77 mg/dL).   Assessment: Anticoag: Heparin>xarelto for Submassive acute pulmonary embolism/ saddle with multiple bilateral embolus per CTA 9/8. dopplers negative for DVT on 9/8. Hgb 10.1 stable.  - s/p peptic ulcer perforation  Goal of Therapy:  Therapeutic oral anticoagulation   Plan:  Xarelto 15 BID through 9/30 Xarelto 20mg  daily   Maria Murphy, PharmD, BCPS Clinical Staff Pharmacist [email or web address] Alford Murphy, Eshani Maestre Stillinger 05/06/2020,7:58 AM

## 2020-05-06 NOTE — Progress Notes (Signed)
Nutrition Brief Note  RD consulted for assessment.  Originally planned to d/c to SNF this afternoon but this has now been pushed until tomorrow. If patient remains admitted tomorrow, RD to see at bedside for full assessment.   Mariana Single RD, LDN Clinical Nutrition Pager listed in [place]

## 2020-05-06 NOTE — TOC Progression Note (Signed)
Transition of Care [facility]) - Progression Note    Patient Details  Name: Maria Murphy MRN: [id number] Date of Birth: 11-03-1955  Transition of Care [facility]) CM/SW [place], [place] Phone Number: 05/06/2020, 8:45 AM  Clinical Narrative:     CSW spoke with [facility] and they can accept patient for SNF placement.  Patient has SNF bed at [facility] when medically ready.  CSW will continue to follow.  Expected Discharge Plan: [place] Barriers to Discharge: Continued Medical Work up  Expected Discharge Plan and Services Expected Discharge Plan: [place] In-house Referral: Clinical Social Work   Post Acute Care Choice: [place] Living arrangements for the past 2 months: Apartment                                       Social Determinants of Health (SDOH) Interventions    Readmission Risk Interventions Readmission Risk Prevention Plan 04/29/2020  Medication Screening Complete  Transportation Screening Complete  Some recent data might be hidden

## 2020-05-06 NOTE — Discharge Summary (Addendum)
Physician Discharge Summary  Maria Murphy TDS:[id number] DOB: 13-Apr-1956 DOA: 04/21/2020  PCP: Rosita Fire, MD  Admit date: 04/21/2020 Discharge date: 05/06/2020  Admitted From: Home  Disposition:  SNF   Recommendations for Outpatient Follow-up and new medication changes:  1. Follow up with Dr. Legrand Rams in 7 days.  2. Patient has been placed on pantoprazole for peptic ulcer disease 3. 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  64 year old female, [facility],with the working diagnosis biliary pancreatitis,chronic cholecystitis/cholelithiasis. Patient underwent further work-up with MRCP, which showed free fluid in the abdomen and pneumoperitoneum. Patient was placed on broad-spectrum antibiotic therapy and vasopressors, she underwent emergent laparotomy. Patient was found to have a perforation in the prepyloric area along the lesser curvature of the stomach and antrum, and a Phillip Heal application was performed. Surgical culture positive for Enterococcus and Candida. Patient was stabilized with antibiotic therapy,butshe developed worsening leukocytosis.  A CT of the abdomen pelvis on 9/7 showed a new 5.3 x 14 x 3.9 cm abscess in the pelvis,5.1 x 2.6 x 2.0 subcapsular fluid collection along the right inferior liver, also concerning for abscess. CT of the chest with multifocal pulmonary embolus, pulmonary emboli arising in the saddledistribution from each main pulmonary artery with extension into multiple lower lobe point arteries bilaterally. Positive right heart strain,consistent with  submassive.  She underwent 12 F drain placement into the low abdominopelvic fluid collection. Culture positive for enterococcus and candida.  Patient has been placed on heparin drip with good toleration. Patient very weak and deconditioned.  Echocardiogram with preserved RV function, now transitioned to oral anticoagulation with good toleration.  Patient continue to be very weak and deconditioned, plan to transfer to SNF.    1. Submassive acute pulmonary embolism/ saddle with multiple bilateral embolus/ acute hypoxic respiratory failure.Patient received initially intravenous heparin with good coloration, now transitioned to rivaroxaban.  She remained hemodynamically stable, echocardiography showed preserved RV and LV function. Ultrasonography of the lower extremities negative for deep vein thrombosis.  This is considered a provoked venous thromboembolism related to critical care illness, patient will required antithrombotic therapy for at least 6 months.   2. Abdominal abscess pelvic and hepatic/ enterococcus and candida/ sp IR drainage.with septic shock, not present on admission.  Patient received intravenous Zosyn and fluconazole for for 14 days with good toleration.  The output from the drain has been reduceding.  Her abdominal pain and leukocytosis have improved.  Will check with radiology drain care, before patient gets discharged.  Follow up CT abdomen and pelvis will be performed at the finalization of antibiotic therapy.   3. Perforated gastric ulcer/ peritonitis/ sp laparotomy and graham application. Patient underwent emergent laparotomy, finding perforated prepyloric ulcer, she underwent a successful Phillip Heal application. Her diet advancement, she received initially proton pump inhibitors intravenously now been transitioned to oral.  Her abdominal pain has improved.  4.HTN. Antihypertensive agents were held during her hospitalization.  5. Depression.  Episodic  anxiety, improved with lorazepam.   6. Hypokalemia/hypomagnesemia.   Her renal function remained stable, her electrolytes were corrected, at discharge sodium 135, potassium 4.0, chloride 102, bicarb 25, BUN 7 and creatinine 0.77.  Discharge Diagnoses:  Principal Problem:   Perforated abdominal viscus  cysts, predominantly parapelvic although also with several cortical cysts. An exophytic cyst of the superior pole of the right kidney is partially calcified and demonstrates mixed intrinsic signal, but demonstrates no contrast enhancement. This is consistent with a benign proteinaceous or hemorrhagic cyst. 5. Cholelithiasis. No evidence of acute cholecystitis. No biliary ductal dilatation. These results were called by telephone at the time of interpretation on 04/22/2020 at 10:17 am to Dr. Manuella Ghazi, who verbally acknowledged these results. Electronically Signed   By: Eddie Candle M.D.   On: 04/22/2020 10:31   ECHOCARDIOGRAM COMPLETE  Result Date: 05/02/2020    ECHOCARDIOGRAM REPORT   Patient Name:   Maria Murphy Date of Exam: 05/02/2020 Medical Rec #:  [id number]      Height:       64.0 in Accession #:    [id number]     Weight:       224.9 lb Date of  Birth:  1956/03/28      BSA:          2.056 m Patient Age:    64 years       BP:           143/79 mmHg Patient Gender: F              HR:           71 bpm. Exam Location:  Inpatient Procedure: 2D Echo, Cardiac Doppler and Color Doppler Indications:    Pulmonary Embolus  History:        Patient has no prior history of Echocardiogram examinations.                 Risk Factors:Hypertension and Obesity. Pulmonary embolus, s/p GI                 surgery.  Sonographer:    Dustin Flock Referring Phys: [id number] LeRoy  1. Left ventricular ejection fraction, by estimation, is 60 to 65%. The left ventricle has normal function. The left ventricle has no regional wall motion abnormalities. Left ventricular diastolic parameters were normal. There is the interventricular septum is flattened in systole, consistent with right ventricular pressure overload.  2. Right ventricular systolic function is normal. The right ventricular size is mildly enlarged. There is moderately elevated pulmonary artery systolic pressure. The estimated right ventricular systolic pressure is 53.2 mmHg.  3. The mitral valve is normal in structure. Trivial mitral valve regurgitation. No evidence of mitral stenosis.  4. The aortic valve is tricuspid. Aortic valve regurgitation is not visualized. No aortic stenosis is present. Comparison(s): No prior Echocardiogram. FINDINGS  Left Ventricle: Left ventricular ejection fraction, by estimation, is 60 to 65%. The left ventricle has normal function. The left ventricle has no regional wall motion abnormalities. The left ventricular internal cavity size was normal in size. There is  no left ventricular hypertrophy. The interventricular septum is flattened in systole, consistent with right ventricular pressure overload. Left ventricular diastolic parameters were normal. Right Ventricle: The right ventricular size is mildly enlarged. No increase in right ventricular wall thickness. Right  ventricular systolic function is normal. There is moderately elevated pulmonary artery systolic pressure. The tricuspid regurgitant velocity is 3.28 m/s, and with an assumed right atrial pressure of 8 mmHg, the estimated right ventricular systolic pressure is 99.2 mmHg. Left Atrium: Left atrial size was normal in size. Right Atrium: Right atrial size was normal in size. Pericardium: There is no evidence of pericardial effusion. Mitral Valve: The mitral valve is normal in structure.  cysts, predominantly parapelvic although also with several cortical cysts. An exophytic cyst of the superior pole of the right kidney is partially calcified and demonstrates mixed intrinsic signal, but demonstrates no contrast enhancement. This is consistent with a benign proteinaceous or hemorrhagic cyst. 5. Cholelithiasis. No evidence of acute cholecystitis. No biliary ductal dilatation. These results were called by telephone at the time of interpretation on 04/22/2020 at 10:17 am to Dr. Manuella Ghazi, who verbally acknowledged these results. Electronically Signed   By: Eddie Candle M.D.   On: 04/22/2020 10:31   ECHOCARDIOGRAM COMPLETE  Result Date: 05/02/2020    ECHOCARDIOGRAM REPORT   Patient Name:   Maria Murphy Date of Exam: 05/02/2020 Medical Rec #:  [id number]      Height:       64.0 in Accession #:    [id number]     Weight:       224.9 lb Date of  Birth:  1956/03/28      BSA:          2.056 m Patient Age:    64 years       BP:           143/79 mmHg Patient Gender: F              HR:           71 bpm. Exam Location:  Inpatient Procedure: 2D Echo, Cardiac Doppler and Color Doppler Indications:    Pulmonary Embolus  History:        Patient has no prior history of Echocardiogram examinations.                 Risk Factors:Hypertension and Obesity. Pulmonary embolus, s/p GI                 surgery.  Sonographer:    Dustin Flock Referring Phys: [id number] LeRoy  1. Left ventricular ejection fraction, by estimation, is 60 to 65%. The left ventricle has normal function. The left ventricle has no regional wall motion abnormalities. Left ventricular diastolic parameters were normal. There is the interventricular septum is flattened in systole, consistent with right ventricular pressure overload.  2. Right ventricular systolic function is normal. The right ventricular size is mildly enlarged. There is moderately elevated pulmonary artery systolic pressure. The estimated right ventricular systolic pressure is 53.2 mmHg.  3. The mitral valve is normal in structure. Trivial mitral valve regurgitation. No evidence of mitral stenosis.  4. The aortic valve is tricuspid. Aortic valve regurgitation is not visualized. No aortic stenosis is present. Comparison(s): No prior Echocardiogram. FINDINGS  Left Ventricle: Left ventricular ejection fraction, by estimation, is 60 to 65%. The left ventricle has normal function. The left ventricle has no regional wall motion abnormalities. The left ventricular internal cavity size was normal in size. There is  no left ventricular hypertrophy. The interventricular septum is flattened in systole, consistent with right ventricular pressure overload. Left ventricular diastolic parameters were normal. Right Ventricle: The right ventricular size is mildly enlarged. No increase in right ventricular wall thickness. Right  ventricular systolic function is normal. There is moderately elevated pulmonary artery systolic pressure. The tricuspid regurgitant velocity is 3.28 m/s, and with an assumed right atrial pressure of 8 mmHg, the estimated right ventricular systolic pressure is 99.2 mmHg. Left Atrium: Left atrial size was normal in size. Right Atrium: Right atrial size was normal in size. Pericardium: There is no evidence of pericardial effusion. Mitral Valve: The mitral valve is normal in structure.  cysts, predominantly parapelvic although also with several cortical cysts. An exophytic cyst of the superior pole of the right kidney is partially calcified and demonstrates mixed intrinsic signal, but demonstrates no contrast enhancement. This is consistent with a benign proteinaceous or hemorrhagic cyst. 5. Cholelithiasis. No evidence of acute cholecystitis. No biliary ductal dilatation. These results were called by telephone at the time of interpretation on 04/22/2020 at 10:17 am to Dr. Manuella Ghazi, who verbally acknowledged these results. Electronically Signed   By: Eddie Candle M.D.   On: 04/22/2020 10:31   ECHOCARDIOGRAM COMPLETE  Result Date: 05/02/2020    ECHOCARDIOGRAM REPORT   Patient Name:   Maria Murphy Date of Exam: 05/02/2020 Medical Rec #:  [id number]      Height:       64.0 in Accession #:    [id number]     Weight:       224.9 lb Date of  Birth:  1956/03/28      BSA:          2.056 m Patient Age:    64 years       BP:           143/79 mmHg Patient Gender: F              HR:           71 bpm. Exam Location:  Inpatient Procedure: 2D Echo, Cardiac Doppler and Color Doppler Indications:    Pulmonary Embolus  History:        Patient has no prior history of Echocardiogram examinations.                 Risk Factors:Hypertension and Obesity. Pulmonary embolus, s/p GI                 surgery.  Sonographer:    Dustin Flock Referring Phys: [id number] LeRoy  1. Left ventricular ejection fraction, by estimation, is 60 to 65%. The left ventricle has normal function. The left ventricle has no regional wall motion abnormalities. Left ventricular diastolic parameters were normal. There is the interventricular septum is flattened in systole, consistent with right ventricular pressure overload.  2. Right ventricular systolic function is normal. The right ventricular size is mildly enlarged. There is moderately elevated pulmonary artery systolic pressure. The estimated right ventricular systolic pressure is 53.2 mmHg.  3. The mitral valve is normal in structure. Trivial mitral valve regurgitation. No evidence of mitral stenosis.  4. The aortic valve is tricuspid. Aortic valve regurgitation is not visualized. No aortic stenosis is present. Comparison(s): No prior Echocardiogram. FINDINGS  Left Ventricle: Left ventricular ejection fraction, by estimation, is 60 to 65%. The left ventricle has normal function. The left ventricle has no regional wall motion abnormalities. The left ventricular internal cavity size was normal in size. There is  no left ventricular hypertrophy. The interventricular septum is flattened in systole, consistent with right ventricular pressure overload. Left ventricular diastolic parameters were normal. Right Ventricle: The right ventricular size is mildly enlarged. No increase in right ventricular wall thickness. Right  ventricular systolic function is normal. There is moderately elevated pulmonary artery systolic pressure. The tricuspid regurgitant velocity is 3.28 m/s, and with an assumed right atrial pressure of 8 mmHg, the estimated right ventricular systolic pressure is 99.2 mmHg. Left Atrium: Left atrial size was normal in size. Right Atrium: Right atrial size was normal in size. Pericardium: There is no evidence of pericardial effusion. Mitral Valve: The mitral valve is normal in structure.  cysts, predominantly parapelvic although also with several cortical cysts. An exophytic cyst of the superior pole of the right kidney is partially calcified and demonstrates mixed intrinsic signal, but demonstrates no contrast enhancement. This is consistent with a benign proteinaceous or hemorrhagic cyst. 5. Cholelithiasis. No evidence of acute cholecystitis. No biliary ductal dilatation. These results were called by telephone at the time of interpretation on 04/22/2020 at 10:17 am to Dr. Manuella Ghazi, who verbally acknowledged these results. Electronically Signed   By: Eddie Candle M.D.   On: 04/22/2020 10:31   ECHOCARDIOGRAM COMPLETE  Result Date: 05/02/2020    ECHOCARDIOGRAM REPORT   Patient Name:   Maria Murphy Date of Exam: 05/02/2020 Medical Rec #:  [id number]      Height:       64.0 in Accession #:    [id number]     Weight:       224.9 lb Date of  Birth:  1956/03/28      BSA:          2.056 m Patient Age:    64 years       BP:           143/79 mmHg Patient Gender: F              HR:           71 bpm. Exam Location:  Inpatient Procedure: 2D Echo, Cardiac Doppler and Color Doppler Indications:    Pulmonary Embolus  History:        Patient has no prior history of Echocardiogram examinations.                 Risk Factors:Hypertension and Obesity. Pulmonary embolus, s/p GI                 surgery.  Sonographer:    Dustin Flock Referring Phys: [id number] LeRoy  1. Left ventricular ejection fraction, by estimation, is 60 to 65%. The left ventricle has normal function. The left ventricle has no regional wall motion abnormalities. Left ventricular diastolic parameters were normal. There is the interventricular septum is flattened in systole, consistent with right ventricular pressure overload.  2. Right ventricular systolic function is normal. The right ventricular size is mildly enlarged. There is moderately elevated pulmonary artery systolic pressure. The estimated right ventricular systolic pressure is 53.2 mmHg.  3. The mitral valve is normal in structure. Trivial mitral valve regurgitation. No evidence of mitral stenosis.  4. The aortic valve is tricuspid. Aortic valve regurgitation is not visualized. No aortic stenosis is present. Comparison(s): No prior Echocardiogram. FINDINGS  Left Ventricle: Left ventricular ejection fraction, by estimation, is 60 to 65%. The left ventricle has normal function. The left ventricle has no regional wall motion abnormalities. The left ventricular internal cavity size was normal in size. There is  no left ventricular hypertrophy. The interventricular septum is flattened in systole, consistent with right ventricular pressure overload. Left ventricular diastolic parameters were normal. Right Ventricle: The right ventricular size is mildly enlarged. No increase in right ventricular wall thickness. Right  ventricular systolic function is normal. There is moderately elevated pulmonary artery systolic pressure. The tricuspid regurgitant velocity is 3.28 m/s, and with an assumed right atrial pressure of 8 mmHg, the estimated right ventricular systolic pressure is 99.2 mmHg. Left Atrium: Left atrial size was normal in size. Right Atrium: Right atrial size was normal in size. Pericardium: There is no evidence of pericardial effusion. Mitral Valve: The mitral valve is normal in structure.  Physician Discharge Summary  Maria Murphy TDS:[id number] DOB: 13-Apr-1956 DOA: 04/21/2020  PCP: Rosita Fire, MD  Admit date: 04/21/2020 Discharge date: 05/06/2020  Admitted From: Home  Disposition:  SNF   Recommendations for Outpatient Follow-up and new medication changes:  1. Follow up with Dr. Legrand Rams in 7 days.  2. Patient has been placed on pantoprazole for peptic ulcer disease 3. 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  64 year old female, hospitalized[facility],with the working diagnosis biliary pancreatitis,chronic cholecystitis/cholelithiasis. Patient underwent further work-up with MRCP, which showed free fluid in the abdomen and pneumoperitoneum. Patient was placed on broad-spectrum antibiotic therapy and vasopressors, she underwent emergent laparotomy. Patient was found to have a perforation in the prepyloric area along the lesser curvature of the stomach and antrum, and a Phillip Heal application was performed. Surgical culture positive for Enterococcus and Candida. Patient was stabilized with antibiotic therapy,butshe developed worsening leukocytosis.  A CT of the abdomen pelvis on 9/7 showed a new 5.3 x 14 x 3.9 cm abscess in the pelvis,5.1 x 2.6 x 2.0 subcapsular fluid collection along the right inferior liver, also concerning for abscess. CT of the chest with multifocal pulmonary embolus, pulmonary emboli arising in the saddledistribution from each main pulmonary artery with extension into multiple lower lobe point arteries bilaterally. Positive right heart strain,consistent with  submassive.  She underwent 12 F drain placement into the low abdominopelvic fluid collection. Culture positive for enterococcus and candida.  Patient has been placed on heparin drip with good toleration. Patient very weak and deconditioned.  Echocardiogram with preserved RV function, now transitioned to oral anticoagulation with good toleration.  Patient continue to be very weak and deconditioned, plan to transfer to SNF.    1. Submassive acute pulmonary embolism/ saddle with multiple bilateral embolus/ acute hypoxic respiratory failure.Patient received initially intravenous heparin with good coloration, now transitioned to rivaroxaban.  She remained hemodynamically stable, echocardiography showed preserved RV and LV function. Ultrasonography of the lower extremities negative for deep vein thrombosis.  This is considered a provoked venous thromboembolism related to critical care illness, patient will required antithrombotic therapy for at least 6 months.   2. Abdominal abscess pelvic and hepatic/ enterococcus and candida/ sp IR drainage.with septic shock, not present on admission.  Patient received intravenous Zosyn and fluconazole for for 14 days with good toleration.  The output from the drain has been reduceding.  Her abdominal pain and leukocytosis have improved.  Will check with radiology drain care, before patient gets discharged.  Follow up CT abdomen and pelvis will be performed at the finalization of antibiotic therapy.   3. Perforated gastric ulcer/ peritonitis/ sp laparotomy and graham application. Patient underwent emergent laparotomy, finding perforated prepyloric ulcer, she underwent a successful Phillip Heal application. Her diet advancement, she received initially proton pump inhibitors intravenously now been transitioned to oral.  Her abdominal pain has improved.  4.HTN. Antihypertensive agents were held during her hospitalization.  5. Depression.  Episodic  anxiety, improved with lorazepam.   6. Hypokalemia/hypomagnesemia.   Her renal function remained stable, her electrolytes were corrected, at discharge sodium 135, potassium 4.0, chloride 102, bicarb 25, BUN 7 and creatinine 0.77.  Discharge Diagnoses:  Principal Problem:   Perforated abdominal viscus  Physician Discharge Summary  Maria Murphy TDS:[id number] DOB: 13-Apr-1956 DOA: 04/21/2020  PCP: Rosita Fire, MD  Admit date: 04/21/2020 Discharge date: 05/06/2020  Admitted From: Home  Disposition:  SNF   Recommendations for Outpatient Follow-up and new medication changes:  1. Follow up with Dr. Legrand Rams in 7 days.  2. Patient has been placed on pantoprazole for peptic ulcer disease 3. 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  64 year old female, [facility],with the working diagnosis biliary pancreatitis,chronic cholecystitis/cholelithiasis. Patient underwent further work-up with MRCP, which showed free fluid in the abdomen and pneumoperitoneum. Patient was placed on broad-spectrum antibiotic therapy and vasopressors, she underwent emergent laparotomy. Patient was found to have a perforation in the prepyloric area along the lesser curvature of the stomach and antrum, and a Phillip Heal application was performed. Surgical culture positive for Enterococcus and Candida. Patient was stabilized with antibiotic therapy,butshe developed worsening leukocytosis.  A CT of the abdomen pelvis on 9/7 showed a new 5.3 x 14 x 3.9 cm abscess in the pelvis,5.1 x 2.6 x 2.0 subcapsular fluid collection along the right inferior liver, also concerning for abscess. CT of the chest with multifocal pulmonary embolus, pulmonary emboli arising in the saddledistribution from each main pulmonary artery with extension into multiple lower lobe point arteries bilaterally. Positive right heart strain,consistent with  submassive.  She underwent 12 F drain placement into the low abdominopelvic fluid collection. Culture positive for enterococcus and candida.  Patient has been placed on heparin drip with good toleration. Patient very weak and deconditioned.  Echocardiogram with preserved RV function, now transitioned to oral anticoagulation with good toleration.  Patient continue to be very weak and deconditioned, plan to transfer to SNF.    1. Submassive acute pulmonary embolism/ saddle with multiple bilateral embolus/ acute hypoxic respiratory failure.Patient received initially intravenous heparin with good coloration, now transitioned to rivaroxaban.  She remained hemodynamically stable, echocardiography showed preserved RV and LV function. Ultrasonography of the lower extremities negative for deep vein thrombosis.  This is considered a provoked venous thromboembolism related to critical care illness, patient will required antithrombotic therapy for at least 6 months.   2. Abdominal abscess pelvic and hepatic/ enterococcus and candida/ sp IR drainage.with septic shock, not present on admission.  Patient received intravenous Zosyn and fluconazole for for 14 days with good toleration.  The output from the drain has been reduceding.  Her abdominal pain and leukocytosis have improved.  Will check with radiology drain care, before patient gets discharged.  Follow up CT abdomen and pelvis will be performed at the finalization of antibiotic therapy.   3. Perforated gastric ulcer/ peritonitis/ sp laparotomy and graham application. Patient underwent emergent laparotomy, finding perforated prepyloric ulcer, she underwent a successful Phillip Heal application. Her diet advancement, she received initially proton pump inhibitors intravenously now been transitioned to oral.  Her abdominal pain has improved.  4.HTN. Antihypertensive agents were held during her hospitalization.  5. Depression.  Episodic  anxiety, improved with lorazepam.   6. Hypokalemia/hypomagnesemia.   Her renal function remained stable, her electrolytes were corrected, at discharge sodium 135, potassium 4.0, chloride 102, bicarb 25, BUN 7 and creatinine 0.77.  Discharge Diagnoses:  Principal Problem:   Perforated abdominal viscus  Physician Discharge Summary  Maria Murphy TDS:[id number] DOB: 13-Apr-1956 DOA: 04/21/2020  PCP: Rosita Fire, MD  Admit date: 04/21/2020 Discharge date: 05/06/2020  Admitted From: Home  Disposition:  SNF   Recommendations for Outpatient Follow-up and new medication changes:  1. Follow up with Dr. Legrand Rams in 7 days.  2. Patient has been placed on pantoprazole for peptic ulcer disease 3. 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  64 year old female, [facility],with the working diagnosis biliary pancreatitis,chronic cholecystitis/cholelithiasis. Patient underwent further work-up with MRCP, which showed free fluid in the abdomen and pneumoperitoneum. Patient was placed on broad-spectrum antibiotic therapy and vasopressors, she underwent emergent laparotomy. Patient was found to have a perforation in the prepyloric area along the lesser curvature of the stomach and antrum, and a Phillip Heal application was performed. Surgical culture positive for Enterococcus and Candida. Patient was stabilized with antibiotic therapy,butshe developed worsening leukocytosis.  A CT of the abdomen pelvis on 9/7 showed a new 5.3 x 14 x 3.9 cm abscess in the pelvis,5.1 x 2.6 x 2.0 subcapsular fluid collection along the right inferior liver, also concerning for abscess. CT of the chest with multifocal pulmonary embolus, pulmonary emboli arising in the saddledistribution from each main pulmonary artery with extension into multiple lower lobe point arteries bilaterally. Positive right heart strain,consistent with  submassive.  She underwent 12 F drain placement into the low abdominopelvic fluid collection. Culture positive for enterococcus and candida.  Patient has been placed on heparin drip with good toleration. Patient very weak and deconditioned.  Echocardiogram with preserved RV function, now transitioned to oral anticoagulation with good toleration.  Patient continue to be very weak and deconditioned, plan to transfer to SNF.    1. Submassive acute pulmonary embolism/ saddle with multiple bilateral embolus/ acute hypoxic respiratory failure.Patient received initially intravenous heparin with good coloration, now transitioned to rivaroxaban.  She remained hemodynamically stable, echocardiography showed preserved RV and LV function. Ultrasonography of the lower extremities negative for deep vein thrombosis.  This is considered a provoked venous thromboembolism related to critical care illness, patient will required antithrombotic therapy for at least 6 months.   2. Abdominal abscess pelvic and hepatic/ enterococcus and candida/ sp IR drainage.with septic shock, not present on admission.  Patient received intravenous Zosyn and fluconazole for for 14 days with good toleration.  The output from the drain has been reduceding.  Her abdominal pain and leukocytosis have improved.  Will check with radiology drain care, before patient gets discharged.  Follow up CT abdomen and pelvis will be performed at the finalization of antibiotic therapy.   3. Perforated gastric ulcer/ peritonitis/ sp laparotomy and graham application. Patient underwent emergent laparotomy, finding perforated prepyloric ulcer, she underwent a successful Phillip Heal application. Her diet advancement, she received initially proton pump inhibitors intravenously now been transitioned to oral.  Her abdominal pain has improved.  4.HTN. Antihypertensive agents were held during her hospitalization.  5. Depression.  Episodic  anxiety, improved with lorazepam.   6. Hypokalemia/hypomagnesemia.   Her renal function remained stable, her electrolytes were corrected, at discharge sodium 135, potassium 4.0, chloride 102, bicarb 25, BUN 7 and creatinine 0.77.  Discharge Diagnoses:  Principal Problem:   Perforated abdominal viscus  cysts, predominantly parapelvic although also with several cortical cysts. An exophytic cyst of the superior pole of the right kidney is partially calcified and demonstrates mixed intrinsic signal, but demonstrates no contrast enhancement. This is consistent with a benign proteinaceous or hemorrhagic cyst. 5. Cholelithiasis. No evidence of acute cholecystitis. No biliary ductal dilatation. These results were called by telephone at the time of interpretation on 04/22/2020 at 10:17 am to Dr. Manuella Ghazi, who verbally acknowledged these results. Electronically Signed   By: Eddie Candle M.D.   On: 04/22/2020 10:31   ECHOCARDIOGRAM COMPLETE  Result Date: 05/02/2020    ECHOCARDIOGRAM REPORT   Patient Name:   Maria Murphy Date of Exam: 05/02/2020 Medical Rec #:  [id number]      Height:       64.0 in Accession #:    [id number]     Weight:       224.9 lb Date of  Birth:  1956/03/28      BSA:          2.056 m Patient Age:    64 years       BP:           143/79 mmHg Patient Gender: F              HR:           71 bpm. Exam Location:  Inpatient Procedure: 2D Echo, Cardiac Doppler and Color Doppler Indications:    Pulmonary Embolus  History:        Patient has no prior history of Echocardiogram examinations.                 Risk Factors:Hypertension and Obesity. Pulmonary embolus, s/p GI                 surgery.  Sonographer:    Dustin Flock Referring Phys: [id number] LeRoy  1. Left ventricular ejection fraction, by estimation, is 60 to 65%. The left ventricle has normal function. The left ventricle has no regional wall motion abnormalities. Left ventricular diastolic parameters were normal. There is the interventricular septum is flattened in systole, consistent with right ventricular pressure overload.  2. Right ventricular systolic function is normal. The right ventricular size is mildly enlarged. There is moderately elevated pulmonary artery systolic pressure. The estimated right ventricular systolic pressure is 53.2 mmHg.  3. The mitral valve is normal in structure. Trivial mitral valve regurgitation. No evidence of mitral stenosis.  4. The aortic valve is tricuspid. Aortic valve regurgitation is not visualized. No aortic stenosis is present. Comparison(s): No prior Echocardiogram. FINDINGS  Left Ventricle: Left ventricular ejection fraction, by estimation, is 60 to 65%. The left ventricle has normal function. The left ventricle has no regional wall motion abnormalities. The left ventricular internal cavity size was normal in size. There is  no left ventricular hypertrophy. The interventricular septum is flattened in systole, consistent with right ventricular pressure overload. Left ventricular diastolic parameters were normal. Right Ventricle: The right ventricular size is mildly enlarged. No increase in right ventricular wall thickness. Right  ventricular systolic function is normal. There is moderately elevated pulmonary artery systolic pressure. The tricuspid regurgitant velocity is 3.28 m/s, and with an assumed right atrial pressure of 8 mmHg, the estimated right ventricular systolic pressure is 99.2 mmHg. Left Atrium: Left atrial size was normal in size. Right Atrium: Right atrial size was normal in size. Pericardium: There is no evidence of pericardial effusion. Mitral Valve: The mitral valve is normal in structure.  cysts, predominantly parapelvic although also with several cortical cysts. An exophytic cyst of the superior pole of the right kidney is partially calcified and demonstrates mixed intrinsic signal, but demonstrates no contrast enhancement. This is consistent with a benign proteinaceous or hemorrhagic cyst. 5. Cholelithiasis. No evidence of acute cholecystitis. No biliary ductal dilatation. These results were called by telephone at the time of interpretation on 04/22/2020 at 10:17 am to Dr. Manuella Ghazi, who verbally acknowledged these results. Electronically Signed   By: Eddie Candle M.D.   On: 04/22/2020 10:31   ECHOCARDIOGRAM COMPLETE  Result Date: 05/02/2020    ECHOCARDIOGRAM REPORT   Patient Name:   Maria Murphy Date of Exam: 05/02/2020 Medical Rec #:  [id number]      Height:       64.0 in Accession #:    [id number]     Weight:       224.9 lb Date of  Birth:  1956/03/28      BSA:          2.056 m Patient Age:    64 years       BP:           143/79 mmHg Patient Gender: F              HR:           71 bpm. Exam Location:  Inpatient Procedure: 2D Echo, Cardiac Doppler and Color Doppler Indications:    Pulmonary Embolus  History:        Patient has no prior history of Echocardiogram examinations.                 Risk Factors:Hypertension and Obesity. Pulmonary embolus, s/p GI                 surgery.  Sonographer:    Dustin Flock Referring Phys: [id number] LeRoy  1. Left ventricular ejection fraction, by estimation, is 60 to 65%. The left ventricle has normal function. The left ventricle has no regional wall motion abnormalities. Left ventricular diastolic parameters were normal. There is the interventricular septum is flattened in systole, consistent with right ventricular pressure overload.  2. Right ventricular systolic function is normal. The right ventricular size is mildly enlarged. There is moderately elevated pulmonary artery systolic pressure. The estimated right ventricular systolic pressure is 53.2 mmHg.  3. The mitral valve is normal in structure. Trivial mitral valve regurgitation. No evidence of mitral stenosis.  4. The aortic valve is tricuspid. Aortic valve regurgitation is not visualized. No aortic stenosis is present. Comparison(s): No prior Echocardiogram. FINDINGS  Left Ventricle: Left ventricular ejection fraction, by estimation, is 60 to 65%. The left ventricle has normal function. The left ventricle has no regional wall motion abnormalities. The left ventricular internal cavity size was normal in size. There is  no left ventricular hypertrophy. The interventricular septum is flattened in systole, consistent with right ventricular pressure overload. Left ventricular diastolic parameters were normal. Right Ventricle: The right ventricular size is mildly enlarged. No increase in right ventricular wall thickness. Right  ventricular systolic function is normal. There is moderately elevated pulmonary artery systolic pressure. The tricuspid regurgitant velocity is 3.28 m/s, and with an assumed right atrial pressure of 8 mmHg, the estimated right ventricular systolic pressure is 99.2 mmHg. Left Atrium: Left atrial size was normal in size. Right Atrium: Right atrial size was normal in size. Pericardium: There is no evidence of pericardial effusion. Mitral Valve: The mitral valve is normal in structure.  cysts, predominantly parapelvic although also with several cortical cysts. An exophytic cyst of the superior pole of the right kidney is partially calcified and demonstrates mixed intrinsic signal, but demonstrates no contrast enhancement. This is consistent with a benign proteinaceous or hemorrhagic cyst. 5. Cholelithiasis. No evidence of acute cholecystitis. No biliary ductal dilatation. These results were called by telephone at the time of interpretation on 04/22/2020 at 10:17 am to Dr. Manuella Ghazi, who verbally acknowledged these results. Electronically Signed   By: Eddie Candle M.D.   On: 04/22/2020 10:31   ECHOCARDIOGRAM COMPLETE  Result Date: 05/02/2020    ECHOCARDIOGRAM REPORT   Patient Name:   Maria Murphy Date of Exam: 05/02/2020 Medical Rec #:  [id number]      Height:       64.0 in Accession #:    [id number]     Weight:       224.9 lb Date of  Birth:  1956/03/28      BSA:          2.056 m Patient Age:    64 years       BP:           143/79 mmHg Patient Gender: F              HR:           71 bpm. Exam Location:  Inpatient Procedure: 2D Echo, Cardiac Doppler and Color Doppler Indications:    Pulmonary Embolus  History:        Patient has no prior history of Echocardiogram examinations.                 Risk Factors:Hypertension and Obesity. Pulmonary embolus, s/p GI                 surgery.  Sonographer:    Dustin Flock Referring Phys: [id number] LeRoy  1. Left ventricular ejection fraction, by estimation, is 60 to 65%. The left ventricle has normal function. The left ventricle has no regional wall motion abnormalities. Left ventricular diastolic parameters were normal. There is the interventricular septum is flattened in systole, consistent with right ventricular pressure overload.  2. Right ventricular systolic function is normal. The right ventricular size is mildly enlarged. There is moderately elevated pulmonary artery systolic pressure. The estimated right ventricular systolic pressure is 53.2 mmHg.  3. The mitral valve is normal in structure. Trivial mitral valve regurgitation. No evidence of mitral stenosis.  4. The aortic valve is tricuspid. Aortic valve regurgitation is not visualized. No aortic stenosis is present. Comparison(s): No prior Echocardiogram. FINDINGS  Left Ventricle: Left ventricular ejection fraction, by estimation, is 60 to 65%. The left ventricle has normal function. The left ventricle has no regional wall motion abnormalities. The left ventricular internal cavity size was normal in size. There is  no left ventricular hypertrophy. The interventricular septum is flattened in systole, consistent with right ventricular pressure overload. Left ventricular diastolic parameters were normal. Right Ventricle: The right ventricular size is mildly enlarged. No increase in right ventricular wall thickness. Right  ventricular systolic function is normal. There is moderately elevated pulmonary artery systolic pressure. The tricuspid regurgitant velocity is 3.28 m/s, and with an assumed right atrial pressure of 8 mmHg, the estimated right ventricular systolic pressure is 99.2 mmHg. Left Atrium: Left atrial size was normal in size. Right Atrium: Right atrial size was normal in size. Pericardium: There is no evidence of pericardial effusion. Mitral Valve: The mitral valve is normal in structure.  Physician Discharge Summary  Maria Murphy TDS:[id number] DOB: 13-Apr-1956 DOA: 04/21/2020  PCP: Rosita Fire, MD  Admit date: 04/21/2020 Discharge date: 05/06/2020  Admitted From: Home  Disposition:  SNF   Recommendations for Outpatient Follow-up and new medication changes:  1. Follow up with Dr. Legrand Rams in 7 days.  2. Patient has been placed on pantoprazole for peptic ulcer disease 3. 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  64 year old female, [facility],with the working diagnosis biliary pancreatitis,chronic cholecystitis/cholelithiasis. Patient underwent further work-up with MRCP, which showed free fluid in the abdomen and pneumoperitoneum. Patient was placed on broad-spectrum antibiotic therapy and vasopressors, she underwent emergent laparotomy. Patient was found to have a perforation in the prepyloric area along the lesser curvature of the stomach and antrum, and a Phillip Heal application was performed. Surgical culture positive for Enterococcus and Candida. Patient was stabilized with antibiotic therapy,butshe developed worsening leukocytosis.  A CT of the abdomen pelvis on 9/7 showed a new 5.3 x 14 x 3.9 cm abscess in the pelvis,5.1 x 2.6 x 2.0 subcapsular fluid collection along the right inferior liver, also concerning for abscess. CT of the chest with multifocal pulmonary embolus, pulmonary emboli arising in the saddledistribution from each main pulmonary artery with extension into multiple lower lobe point arteries bilaterally. Positive right heart strain,consistent with  submassive.  She underwent 12 F drain placement into the low abdominopelvic fluid collection. Culture positive for enterococcus and candida.  Patient has been placed on heparin drip with good toleration. Patient very weak and deconditioned.  Echocardiogram with preserved RV function, now transitioned to oral anticoagulation with good toleration.  Patient continue to be very weak and deconditioned, plan to transfer to SNF.    1. Submassive acute pulmonary embolism/ saddle with multiple bilateral embolus/ acute hypoxic respiratory failure.Patient received initially intravenous heparin with good coloration, now transitioned to rivaroxaban.  She remained hemodynamically stable, echocardiography showed preserved RV and LV function. Ultrasonography of the lower extremities negative for deep vein thrombosis.  This is considered a provoked venous thromboembolism related to critical care illness, patient will required antithrombotic therapy for at least 6 months.   2. Abdominal abscess pelvic and hepatic/ enterococcus and candida/ sp IR drainage.with septic shock, not present on admission.  Patient received intravenous Zosyn and fluconazole for for 14 days with good toleration.  The output from the drain has been reduceding.  Her abdominal pain and leukocytosis have improved.  Will check with radiology drain care, before patient gets discharged.  Follow up CT abdomen and pelvis will be performed at the finalization of antibiotic therapy.   3. Perforated gastric ulcer/ peritonitis/ sp laparotomy and graham application. Patient underwent emergent laparotomy, finding perforated prepyloric ulcer, she underwent a successful Phillip Heal application. Her diet advancement, she received initially proton pump inhibitors intravenously now been transitioned to oral.  Her abdominal pain has improved.  4.HTN. Antihypertensive agents were held during her hospitalization.  5. Depression.  Episodic  anxiety, improved with lorazepam.   6. Hypokalemia/hypomagnesemia.   Her renal function remained stable, her electrolytes were corrected, at discharge sodium 135, potassium 4.0, chloride 102, bicarb 25, BUN 7 and creatinine 0.77.  Discharge Diagnoses:  Principal Problem:   Perforated abdominal viscus  cysts, predominantly parapelvic although also with several cortical cysts. An exophytic cyst of the superior pole of the right kidney is partially calcified and demonstrates mixed intrinsic signal, but demonstrates no contrast enhancement. This is consistent with a benign proteinaceous or hemorrhagic cyst. 5. Cholelithiasis. No evidence of acute cholecystitis. No biliary ductal dilatation. These results were called by telephone at the time of interpretation on 04/22/2020 at 10:17 am to Dr. Manuella Ghazi, who verbally acknowledged these results. Electronically Signed   By: Eddie Candle M.D.   On: 04/22/2020 10:31   ECHOCARDIOGRAM COMPLETE  Result Date: 05/02/2020    ECHOCARDIOGRAM REPORT   Patient Name:   Maria Murphy Date of Exam: 05/02/2020 Medical Rec #:  [id number]      Height:       64.0 in Accession #:    [id number]     Weight:       224.9 lb Date of  Birth:  1956/03/28      BSA:          2.056 m Patient Age:    64 years       BP:           143/79 mmHg Patient Gender: F              HR:           71 bpm. Exam Location:  Inpatient Procedure: 2D Echo, Cardiac Doppler and Color Doppler Indications:    Pulmonary Embolus  History:        Patient has no prior history of Echocardiogram examinations.                 Risk Factors:Hypertension and Obesity. Pulmonary embolus, s/p GI                 surgery.  Sonographer:    Dustin Flock Referring Phys: [id number] LeRoy  1. Left ventricular ejection fraction, by estimation, is 60 to 65%. The left ventricle has normal function. The left ventricle has no regional wall motion abnormalities. Left ventricular diastolic parameters were normal. There is the interventricular septum is flattened in systole, consistent with right ventricular pressure overload.  2. Right ventricular systolic function is normal. The right ventricular size is mildly enlarged. There is moderately elevated pulmonary artery systolic pressure. The estimated right ventricular systolic pressure is 53.2 mmHg.  3. The mitral valve is normal in structure. Trivial mitral valve regurgitation. No evidence of mitral stenosis.  4. The aortic valve is tricuspid. Aortic valve regurgitation is not visualized. No aortic stenosis is present. Comparison(s): No prior Echocardiogram. FINDINGS  Left Ventricle: Left ventricular ejection fraction, by estimation, is 60 to 65%. The left ventricle has normal function. The left ventricle has no regional wall motion abnormalities. The left ventricular internal cavity size was normal in size. There is  no left ventricular hypertrophy. The interventricular septum is flattened in systole, consistent with right ventricular pressure overload. Left ventricular diastolic parameters were normal. Right Ventricle: The right ventricular size is mildly enlarged. No increase in right ventricular wall thickness. Right  ventricular systolic function is normal. There is moderately elevated pulmonary artery systolic pressure. The tricuspid regurgitant velocity is 3.28 m/s, and with an assumed right atrial pressure of 8 mmHg, the estimated right ventricular systolic pressure is 99.2 mmHg. Left Atrium: Left atrial size was normal in size. Right Atrium: Right atrial size was normal in size. Pericardium: There is no evidence of pericardial effusion. Mitral Valve: The mitral valve is normal in structure.  Physician Discharge Summary  Maria Murphy TDS:[id number] DOB: 13-Apr-1956 DOA: 04/21/2020  PCP: Rosita Fire, MD  Admit date: 04/21/2020 Discharge date: 05/06/2020  Admitted From: Home  Disposition:  SNF   Recommendations for Outpatient Follow-up and new medication changes:  1. Follow up with Dr. Legrand Rams in 7 days.  2. Patient has been placed on pantoprazole for peptic ulcer disease 3. 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  64 year old female, [facility],with the working diagnosis biliary pancreatitis,chronic cholecystitis/cholelithiasis. Patient underwent further work-up with MRCP, which showed free fluid in the abdomen and pneumoperitoneum. Patient was placed on broad-spectrum antibiotic therapy and vasopressors, she underwent emergent laparotomy. Patient was found to have a perforation in the prepyloric area along the lesser curvature of the stomach and antrum, and a Phillip Heal application was performed. Surgical culture positive for Enterococcus and Candida. Patient was stabilized with antibiotic therapy,butshe developed worsening leukocytosis.  A CT of the abdomen pelvis on 9/7 showed a new 5.3 x 14 x 3.9 cm abscess in the pelvis,5.1 x 2.6 x 2.0 subcapsular fluid collection along the right inferior liver, also concerning for abscess. CT of the chest with multifocal pulmonary embolus, pulmonary emboli arising in the saddledistribution from each main pulmonary artery with extension into multiple lower lobe point arteries bilaterally. Positive right heart strain,consistent with  submassive.  She underwent 12 F drain placement into the low abdominopelvic fluid collection. Culture positive for enterococcus and candida.  Patient has been placed on heparin drip with good toleration. Patient very weak and deconditioned.  Echocardiogram with preserved RV function, now transitioned to oral anticoagulation with good toleration.  Patient continue to be very weak and deconditioned, plan to transfer to SNF.    1. Submassive acute pulmonary embolism/ saddle with multiple bilateral embolus/ acute hypoxic respiratory failure.Patient received initially intravenous heparin with good coloration, now transitioned to rivaroxaban.  She remained hemodynamically stable, echocardiography showed preserved RV and LV function. Ultrasonography of the lower extremities negative for deep vein thrombosis.  This is considered a provoked venous thromboembolism related to critical care illness, patient will required antithrombotic therapy for at least 6 months.   2. Abdominal abscess pelvic and hepatic/ enterococcus and candida/ sp IR drainage.with septic shock, not present on admission.  Patient received intravenous Zosyn and fluconazole for for 14 days with good toleration.  The output from the drain has been reduceding.  Her abdominal pain and leukocytosis have improved.  Will check with radiology drain care, before patient gets discharged.  Follow up CT abdomen and pelvis will be performed at the finalization of antibiotic therapy.   3. Perforated gastric ulcer/ peritonitis/ sp laparotomy and graham application. Patient underwent emergent laparotomy, finding perforated prepyloric ulcer, she underwent a successful Phillip Heal application. Her diet advancement, she received initially proton pump inhibitors intravenously now been transitioned to oral.  Her abdominal pain has improved.  4.HTN. Antihypertensive agents were held during her hospitalization.  5. Depression.  Episodic  anxiety, improved with lorazepam.   6. Hypokalemia/hypomagnesemia.   Her renal function remained stable, her electrolytes were corrected, at discharge sodium 135, potassium 4.0, chloride 102, bicarb 25, BUN 7 and creatinine 0.77.  Discharge Diagnoses:  Principal Problem:   Perforated abdominal viscus  cysts, predominantly parapelvic although also with several cortical cysts. An exophytic cyst of the superior pole of the right kidney is partially calcified and demonstrates mixed intrinsic signal, but demonstrates no contrast enhancement. This is consistent with a benign proteinaceous or hemorrhagic cyst. 5. Cholelithiasis. No evidence of acute cholecystitis. No biliary ductal dilatation. These results were called by telephone at the time of interpretation on 04/22/2020 at 10:17 am to Dr. Manuella Ghazi, who verbally acknowledged these results. Electronically Signed   By: Eddie Candle M.D.   On: 04/22/2020 10:31   ECHOCARDIOGRAM COMPLETE  Result Date: 05/02/2020    ECHOCARDIOGRAM REPORT   Patient Name:   Maria Murphy Date of Exam: 05/02/2020 Medical Rec #:  [id number]      Height:       64.0 in Accession #:    [id number]     Weight:       224.9 lb Date of  Birth:  1956/03/28      BSA:          2.056 m Patient Age:    64 years       BP:           143/79 mmHg Patient Gender: F              HR:           71 bpm. Exam Location:  Inpatient Procedure: 2D Echo, Cardiac Doppler and Color Doppler Indications:    Pulmonary Embolus  History:        Patient has no prior history of Echocardiogram examinations.                 Risk Factors:Hypertension and Obesity. Pulmonary embolus, s/p GI                 surgery.  Sonographer:    Dustin Flock Referring Phys: [id number] [place]  1. Left ventricular ejection fraction, by estimation, is 60 to 65%. The left ventricle has normal function. The left ventricle has no regional wall motion abnormalities. Left ventricular diastolic parameters were normal. There is the interventricular septum is flattened in systole, consistent with right ventricular pressure overload.  2. Right ventricular systolic function is normal. The right ventricular size is mildly enlarged. There is moderately elevated pulmonary artery systolic pressure. The estimated right ventricular systolic pressure is 53.2 mmHg.  3. The mitral valve is normal in structure. Trivial mitral valve regurgitation. No evidence of mitral stenosis.  4. The aortic valve is tricuspid. Aortic valve regurgitation is not visualized. No aortic stenosis is present. Comparison(s): No prior Echocardiogram. FINDINGS  Left Ventricle: Left ventricular ejection fraction, by estimation, is 60 to 65%. The left ventricle has normal function. The left ventricle has no regional wall motion abnormalities. The left ventricular internal cavity size was normal in size. There is  no left ventricular hypertrophy. The interventricular septum is flattened in systole, consistent with right ventricular pressure overload. Left ventricular diastolic parameters were normal. Right Ventricle: The right ventricular size is mildly enlarged. No increase in right ventricular wall thickness. Right  ventricular systolic function is normal. There is moderately elevated pulmonary artery systolic pressure. The tricuspid regurgitant velocity is 3.28 m/s, and with an assumed right atrial pressure of 8 mmHg, the estimated right ventricular systolic pressure is 99.2 mmHg. Left Atrium: Left atrial size was normal in size. Right Atrium: Right atrial size was normal in size. Pericardium: There is no evidence of pericardial effusion. Mitral Valve: The mitral valve is normal in structure.

## 2020-05-06 NOTE — Social Work (Signed)
Plan was for dc to [facility] room 207-B today but dc canceled as CT not completed today. Notified Melissa with [facility] [phone] who confirmed they are able to accept tomorrow. SW will follow.   Wandra Feinstein, MSW, LCSW [phone] (coverage)

## 2020-05-06 NOTE — Progress Notes (Signed)
Report called to Sun at [place]. Detailed history given, medications reviewed. Informed nurse that  Pt will be coming to  Facility after she receives 1600 dose of Zosyn. Questions answered to satisfaction, phone number given to [facility] should she have any questions and/or concerns.   Maxson Oddo M

## 2020-05-06 NOTE — Progress Notes (Signed)
HOSPITAL MEDICINE OVERNIGHT EVENT NOTE    Called and notified by Dr. Mckinley Jewel with radiology no concerning CT imaging results of the abdomen and pelvis performed this evening.  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  I went to go evaluate the patient at the bedside.  Patient is awake alert and oriented x3.  Patient is reported that she was feeling better day by day suddenly this morning she began to develop a recurrence of sharp abdominal pain, now severe intensity, beginning in the periumbilical region and now diffuse.  Patient also has been experiencing extremely poor appetite today without vomiting.  Abdominal exam reveals diffusely tender abdomen that is somewhat firm with bowel sounds that are present.  Left lower quadrant JP drain is still present draining serosanguineous drainage.  There is a notable dressing in the epigastric region that is over the previous surgical site from the last laparotomy, 1 I temporarily removed this dressing I observed ongoing what looks to be yellow to purulent drainage.  Patient is hemodynamically stable with mild tachycardia.  Case discussed with Dr. Redmond Pulling with general surgery.  He has advised that the patient be immediately made n.p.o., placed on intravenous fluids and restarted on intravenous antibiotics.  He is also advised that the patient's oral anticoagulants be held in preparation for possible surgery in the next 1 to 2 days.  Once Xarelto has worn off, patient will likely need to be transitioned to heparin infusion perioperatively.  He stated that surgery will round on the patient tomorrow morning.  He stated that he will place orders for new labs to evaluate the patient further.  Continue close Monitoring.  Vernelle Emerald   MD [facility]

## 2020-05-06 NOTE — Progress Notes (Signed)
OT Cancellation Note  Patient Details Name: Maria Murphy MRN: [id number] DOB: [birth year]   Cancelled Treatment:    Reason Eval/Treat Not Completed:  Pt discharging to [facility] today, will defer OT eval to SNF.  Malka So 05/06/2020, 9:45 AM  Nestor Lewandowsky, OTR/L Acute Rehabilitation Services Pager: [phone] Office: [phone]

## 2020-05-07 ENCOUNTER — Inpatient Hospital Stay (HOSPITAL_COMMUNITY): Payer: Medicaid Other

## 2020-05-07 LAB — COMPREHENSIVE METABOLIC PANEL
ALT: 22 U/L (ref 0–44)
AST: 40 U/L (ref 15–41)
Albumin: 2.4 g/dL — ABNORMAL LOW (ref 3.5–5.0)
Alkaline Phosphatase: 54 U/L (ref 38–126)
Anion gap: 9 (ref 5–15)
BUN: 7 mg/dL — ABNORMAL LOW (ref 8–23)
CO2: 26 mmol/L (ref 22–32)
Calcium: 8.6 mg/dL — ABNORMAL LOW (ref 8.9–10.3)
Chloride: 99 mmol/L (ref 98–111)
Creatinine, Ser: 0.83 mg/dL (ref 0.44–1.00)
GFR calc Af Amer: >60 mL/min (ref >60)
GFR calc non Af Amer: >60 mL/min (ref >60)
Glucose, Bld: 116 mg/dL — ABNORMAL HIGH (ref 70–99)
Potassium: 4.2 mmol/L (ref 3.5–5.1)
Sodium: 134 mmol/L — ABNORMAL LOW (ref 135–145)
Total Bilirubin: 0.6 mg/dL (ref 0.3–1.2)
Total Protein: 6.7 g/dL (ref 6.5–8.1)

## 2020-05-07 LAB — CBC
HCT: 32.6 % — ABNORMAL LOW (ref 36.0–46.0)
Hemoglobin: 11 g/dL — ABNORMAL LOW (ref 12.0–15.0)
MCH: 29.6 pg (ref 26.0–34.0)
MCHC: 33.7 g/dL (ref 30.0–36.0)
MCV: 87.6 fL (ref 80.0–100.0)
Platelets: 231 10*3/uL (ref 150–400)
RBC: 3.72 MIL/uL — ABNORMAL LOW (ref 3.87–5.11)
RDW: 13.5 % (ref 11.5–15.5)
WBC: 7.1 10*3/uL (ref 4.0–10.5)
nRBC: 0 % (ref 0.0–0.2)

## 2020-05-07 LAB — HEPARIN LEVEL (UNFRACTIONATED)
Heparin Unfractionated: >2.2 IU/mL — ABNORMAL HIGH (ref 0.30–0.70)
Heparin Unfractionated: >2.2 IU/mL — ABNORMAL HIGH (ref 0.30–0.70)

## 2020-05-07 LAB — APTT
aPTT: >200 seconds (ref 24–36)
aPTT: 168 seconds (ref 24–36)

## 2020-05-07 LAB — PREALBUMIN: Prealbumin: 9.4 mg/dL — ABNORMAL LOW (ref 18–38)

## 2020-05-07 MED ORDER — FENTANYL CITRATE (PF) 100 MCG/2ML IJ SOLN
INTRAMUSCULAR | Status: AC | PRN
  Administered 2020-05-07: 50 ug via INTRAVENOUS

## 2020-05-07 MED ORDER — MORPHINE SULFATE (PF) 4 MG/ML IV SOLN
4.0000 mg | INTRAVENOUS | Status: DC | PRN
  Administered 2020-05-07: 4 mg via INTRAVENOUS
  Filled 2020-05-07: qty 1

## 2020-05-07 MED ORDER — HEPARIN (PORCINE) 25000 UT/250ML-% IV SOLN
1300.0000 [IU]/h | INTRAVENOUS | Status: AC
  Administered 2020-05-07: 1250 [IU]/h via INTRAVENOUS
  Administered 2020-05-08 – 2020-05-09 (×3): 1000 [IU]/h via INTRAVENOUS
  Administered 2020-05-10 – 2020-05-12 (×3): 1150 [IU]/h via INTRAVENOUS
  Administered 2020-05-13 – 2020-05-14 (×2): 1400 [IU]/h via INTRAVENOUS
  Filled 2020-05-07 (×8): qty 250

## 2020-05-07 MED ORDER — ADULT MULTIVITAMIN W/MINERALS CH
1.0000 | ORAL_TABLET | Freq: Every day | ORAL | Status: DC
  Administered 2020-05-07 – 2020-05-17 (×11): 1 via ORAL
  Filled 2020-05-07 (×11): qty 1

## 2020-05-07 MED ORDER — MORPHINE SULFATE (PF) 2 MG/ML IV SOLN
2.0000 mg | INTRAVENOUS | Status: DC | PRN
  Administered 2020-05-07 – 2020-05-08 (×4): 2 mg via INTRAVENOUS
  Filled 2020-05-07 (×4): qty 1

## 2020-05-07 MED ORDER — MIDAZOLAM HCL 2 MG/2ML IJ SOLN
INTRAMUSCULAR | Status: AC | PRN
  Administered 2020-05-07: 1 mg via INTRAVENOUS

## 2020-05-07 MED ORDER — ENSURE ENLIVE PO LIQD
237.0000 mL | Freq: Two times a day (BID) | ORAL | Status: DC
  Administered 2020-05-09 – 2020-05-17 (×14): 237 mL via ORAL

## 2020-05-07 MED ORDER — HEPARIN BOLUS VIA INFUSION
4000.0000 [IU] | Freq: Once | INTRAVENOUS | Status: AC
  Administered 2020-05-07: 4000 [IU] via INTRAVENOUS
  Filled 2020-05-07: qty 4000

## 2020-05-07 MED ORDER — FENTANYL CITRATE (PF) 100 MCG/2ML IJ SOLN
INTRAMUSCULAR | Status: AC
  Filled 2020-05-07: qty 2

## 2020-05-07 MED ORDER — MIDAZOLAM HCL 2 MG/2ML IJ SOLN
INTRAMUSCULAR | Status: AC
  Filled 2020-05-07: qty 2

## 2020-05-07 MED ORDER — FENTANYL CITRATE (PF) 100 MCG/2ML IJ SOLN
12.5000 ug | INTRAMUSCULAR | Status: DC | PRN
Start: 2020-05-07 — End: 2020-05-07

## 2020-05-07 MED ORDER — DEXTROSE IN LACTATED RINGERS 5 % IV SOLN: INTRAVENOUS | Status: DC

## 2020-05-07 NOTE — Procedures (Signed)
Interventional Radiology Procedure Note  Procedure: CT aspiration of small sub-hepatic fluid collection, ~3-5cc aspirated.  Cx sent .  Complications: None  Recommendations:  - Follow up culture - OK to restart heparin - Routine wound care   Signed,  Dulcy Fanny. Earleen Newport, DO

## 2020-05-07 NOTE — Consult Note (Signed)
Chief Complaint: Patient was seen in consultation today for abdominal fluid collection aspiration Chief Complaint  Patient presents with  . Abdominal Pain   at the request of Dr Doylene Canning   Supervising Physician: Corrie Mckusick  Patient Status: [facility] - In-pt  History of Present Illness: Maria Murphy is a 64 y.o. female   Hx recent PE Known to IR Pelvic abscess drain placed in IR 9/8 Post op abscess -- repair perforated pre- pyloric gastric ulcer- [facility] Dr Arnoldo Morale  Pt was doing well with drain Recovering from PE Last day or so new abd pain  CT yesterday:  IMPRESSION: 1. Interval development of a 3.4 x 1.8 x 1.9 cm right upper abdominal gas and fluid collection just anterior to the gallbladder fundus that appears to be continuous with the area of prior pre-pyloric gastric ulcer perforation (and therefore in keeping with a persistent enteric leak). This new subhepatic abscess also appears to be continuous with the upper abdominal subcutaneous soft tissues/incision consistent with a cutaneous fistula. 2. Interval decrease in size of a 3.4 x 2.1 x 1.8 cm subcapsular right hepatic fluid collection. 3. Interval placement of a left pelvic drain with interval decrease in size of a left pelvic gas and fluid collection with the largest pocket now measuring 8 x 5 cm (from 11 x 5 cm). 4. Interval decrease in bilateral trace pleural effusions with bibasilar atelectasis. 5. Other imaging findings of potential clinical significance: Cholelithiasis. Similar-appearing cystic pancreatic head and neck lesions measuring up to 2.2 cm with associated calcifications that are better evaluated on MR abdomen 04/22/2020. Stable 2.3 cm heterogeneous right renal lesion that likely represents a benign proteinaceous versus hemorrhagic cyst per MRI abdomen 04/22/2020.   Request made for sub hepatic fluid collection aspiration Dr Earleen Newport has reviewed imaging and approves procedure    Past Medical  History:  Diagnosis Date  . Essential hypertension, benign 01/24/2015  . Foot ulcer ([place])   . Hypertension   . Memory loss   . Skin cancer   . Vision abnormalities     Past Surgical History:  Procedure Laterality Date  . CENTRAL LINE INSERTION Right 04/22/2020   Procedure: CENTRAL LINE INSERTION;  Surgeon: Virl Cagey, MD;  Location: [facility];  Service: General;  Laterality: Right;  . GASTRORRHAPHY  04/22/2020   Procedure: GASTRORRHAPHY;  Surgeon: Virl Cagey, MD;  Location: [facility];  Service: General;;  . LAPAROTOMY N/A 04/22/2020   Procedure: EXPLORATORY LAPAROTOMY;  Surgeon: Virl Cagey, MD;  Location: [facility];  Service: General;  Laterality: N/A;  . TONSILLECTOMY      Allergies: Wheat extract  Medications: Prior to Admission medications   Medication Sig Start Date End Date Taking? Authorizing Provider  acetaminophen (TYLENOL) 500 MG tablet Take 1 tablet (500 mg total) by mouth every 6 (six) hours as needed. 08/10/18  Yes Idol, Almyra Free, PA-C  citalopram (CELEXA) 20 MG tablet Take 1 tablet (20 mg total) by mouth daily. 08/16/19  Yes Jaynee Eagles, PA-C  donepezil (ARICEPT) 10 MG tablet Take 5-10 mg by mouth in the morning and at bedtime.  04/02/20  Yes [provider]  gabapentin (NEURONTIN) 300 MG capsule Take 300 mg by mouth 2 (two) times daily.  08/23/19  Yes [provider]  lisinopril-hydrochlorothiazide (PRINZIDE,ZESTORETIC) 10-12.5 MG tablet Take 1 tablet by mouth daily. 08/07/18  Yes [provider]  traZODone (DESYREL) 100 MG tablet Take 200 mg by mouth at bedtime.  08/02/19  Yes [provider]  Ginger, Zingiber  officinalis, (GINGER PO) Take 1 tablet by mouth daily.     [provider]  LORazepam (ATIVAN) 1 MG tablet Take 1 tablet (1 mg total) by mouth every 6 (six) hours as needed for anxiety. 05/06/20   Arrien, Jimmy Picket, MD  methocarbamol (ROBAXIN) 500 MG tablet Take 1 tablet (500 mg total) by mouth every 8  (eight) hours as needed for muscle spasms. Patient not taking: Reported on 04/22/2020 03/26/20   Ezequiel Essex, MD  naproxen (NAPROSYN) 500 MG tablet Take 1 tablet (500 mg total) by mouth 2 (two) times daily as needed. Patient not taking: Reported on 04/22/2020 03/26/20   Ezequiel Essex, MD  oxyCODONE (OXY IR/ROXICODONE) 5 MG immediate release tablet Take 1 tablet (5 mg total) by mouth every 6 (six) hours as needed for moderate pain or severe pain. 05/06/20   Arrien, Jimmy Picket, MD  pantoprazole (PROTONIX) 40 MG tablet Take 1 tablet (40 mg total) by mouth 2 (two) times daily. 05/06/20 06/05/20  Arrien, Jimmy Picket, MD  Rivaroxaban (XARELTO) 15 MG TABS tablet Take 1 tablet (15 mg total) by mouth 2 (two) times daily with a meal for 17 days. Complete on 05/23/20 05/06/20 05/23/20  Arrien, Jimmy Picket, MD  rivaroxaban (XARELTO) 20 MG TABS tablet Take 1 tablet (20 mg total) by mouth daily with supper. Start 05/24/20 05/24/20   Arrien, Jimmy Picket, MD     Family History  Problem Relation Age of Onset  . High Cholesterol Mother   . Hypertension Mother   . Arthritis/Rheumatoid Mother   . Alcohol abuse Father   . Colon cancer Neg Hx   . Colon polyps Neg Hx     Social History   Socioeconomic History  . Marital status: Widowed    Spouse name: Not on file  . Number of children: Not on file  . Years of education: Not on file  . Highest education level: Not on file  Occupational History  . Not on file  Tobacco Use  . Smoking status: Never Smoker  . Smokeless tobacco: Never Used  Vaping Use  . Vaping Use: Never used  Substance and Sexual Activity  . Alcohol use: No    Alcohol/week: 1.0 standard drink    Types: 1 Glasses of wine per week    Comment: drank alcohol for past 2 weeks, one glass of wine. Prior to this would drink glass of alcohol on weekends with dinner.   . Drug use: No  . Sexual activity: Not Currently    Birth control/protection: Post-menopausal  Other Topics  Concern  . Not on file  Social History Narrative  . Not on file   Social Determinants of Health   Financial Resource Strain:   . Difficulty of Paying Living Expenses: Not on file  Food Insecurity:   . Worried About Charity fundraiser in the Last Year: Not on file  . Ran Out of Food in the Last Year: Not on file  Transportation Needs:   . Lack of Transportation (Medical): Not on file  . Lack of Transportation (Non-Medical): Not on file  Physical Activity:   . Days of Exercise per Week: Not on file  . Minutes of Exercise per Session: Not on file  Stress:   . Feeling of Stress : Not on file  Social Connections:   . Frequency of Communication with Friends and Family: Not on file  . Frequency of Social Gatherings with Friends and Family: Not on file  . Attends Religious Services: Not on  file  . Active Member of Clubs or Organizations: Not on file  . Attends Archivist Meetings: Not on file  . Marital Status: Not on file    Review of Systems: A 12 point ROS discussed and pertinent positives are indicated in the HPI above.  All other systems are negative.  Review of Systems  Constitutional: Positive for activity change. Negative for fever.  Respiratory: Positive for shortness of breath.   Gastrointestinal: Positive for abdominal pain and nausea.  Neurological: Negative for weakness.  Psychiatric/Behavioral: Negative for behavioral problems and confusion.    Vital Signs: BP 129/82 (BP Location: Left Arm)   Pulse 86   Temp 98.5 F (36.9 C) (Oral)   Resp 20   Ht 5\' 4"  (1.626 m)   Wt 224 lb 13.9 oz (102 kg)   SpO2 95%   BMI 38.60 kg/m   Physical Exam Vitals reviewed.  Cardiovascular:     Rate and Rhythm: Regular rhythm.  Pulmonary:     Effort: Pulmonary effort is normal.     Breath sounds: No wheezing.  Abdominal:     General: Bowel sounds are decreased.     Palpations: Abdomen is soft.     Tenderness: There is abdominal tenderness.  Skin:    General:  Skin is warm.  Neurological:     Mental Status: She is alert and oriented to person, place, and time.  Psychiatric:        Behavior: Behavior normal.     Imaging: DG Chest 1 View  Result Date: 05/02/2020 CLINICAL DATA:  At dyspnea EXAM: CHEST  1 VIEW COMPARISON:  Portable exam 1540 hours compared to 04/30/2020 FINDINGS: Chronic elevation of RIGHT diaphragm. Enlargement of cardiac silhouette. Mediastinal contours and pulmonary vascularity normal. Persistent RIGHT basilar atelectasis. No infiltrate, pleural effusion or pneumothorax. IMPRESSION: Persistent RIGHT basilar atelectasis. Electronically Signed   By: Lavonia Dana M.D.   On: 05/02/2020 15:55   CT Head Wo Contrast  Result Date: 04/20/2020 CLINICAL DATA:  Mental status change EXAM: CT HEAD WITHOUT CONTRAST TECHNIQUE: Contiguous axial images were obtained from the base of the skull through the vertex without intravenous contrast. COMPARISON:  CT brain 03/19/2020 FINDINGS: Brain: No acute territorial infarction, hemorrhage, or intracranial mass. The ventricles are nonenlarged. Vascular: No hyperdense vessels.  No unexpected calcification. Skull: Normal. Negative for fracture or focal lesion. Sinuses/Orbits: No acute finding. Other: None IMPRESSION: Negative non contrasted CT appearance of the brain. Electronically Signed   By: Donavan Foil M.D.   On: 04/20/2020 21:14   CT ANGIO CHEST PE W OR WO CONTRAST  Result Date: 05/01/2020 CLINICAL DATA:  Pulmonary embolus seen on recent abdominal CT. Recent repair of gastric ulcer. EXAM: CT ANGIOGRAPHY CHEST WITH CONTRAST TECHNIQUE: Multidetector CT imaging of the chest was performed using the standard protocol during bolus administration of intravenous contrast. Multiplanar CT image reconstructions and MIPs were obtained to evaluate the vascular anatomy. CONTRAST:  118mL OMNIPAQUE IOHEXOL 350 MG/ML SOLN COMPARISON:  CT abdomen and pelvis including lower lung regions April 30, 2020 FINDINGS:  Cardiovascular: Pulmonary emboli arise in each main pulmonary artery in a saddle type distribution with extension of pulmonary emboli into multiple lower lobe pulmonary arterial branches bilaterally. Right ventricle to left ventricle diameter ratio is measured at 1.1 consistent with a degree of right heart strain. There is no appreciable thoracic aortic aneurysm or dissection. Visualized great vessels appear normal. No pericardial effusion or pericardial thickening is evident. Mediastinum/Nodes: Thyroid appears unremarkable. No adenopathy is evident. There  is a small hiatal hernia. Lungs/Pleura: There are fairly small pleural effusions bilaterally with consolidation in each lower lobe region, somewhat more on the right than on the left. Upper lung regions are clear. No pneumothorax. Upper Abdomen: There is a mild degree of ascites. There is mild pneumoperitoneum which was present 1 day prior. Incomplete visualization of upper pole renal cystic lesions again noted with complex lesion arising from the periphery of the upper pole the right kidney, better seen on prior CT of the abdomen. Cholelithiasis again noted. Postoperative changes noted in the upper right abdomen, incompletely visualized. Air in the midline upper abdominal wall region likely of recent postoperative etiology. Musculoskeletal: Degenerative change noted in the thoracic spine. No blastic or lytic bone lesions. No appreciable chest wall lesions. Review of the MIP images confirms the above findings. IMPRESSION: 1. Again noted multifocal pulmonary embolus with pulmonary emboli arising in a saddle type distribution from each main pulmonary artery with extension into multiple lower lobe pulmonary arteries bilaterally. Positive for acute PE with CTevidence of right heart strain (RV/LV Ratio = 1.1) consistent with at least submassive (intermediate risk) PE. The presence of right heart strain has been associated with an increased risk of morbidity and  mortality. 2. Airspace consolidation consistent with combination of pneumonia and compressive atelectasis in each lower lung region with fairly small pleural effusions bilaterally. 3.  No appreciable adenopathy. 4.  Small hiatal hernia again noted. 5. Pneumoperitoneum likely secondary to recent surgery. Mild ascites present. Postoperative changes in right upper quadrant again noted. Air in the upper abdominal wall is likely of recent postoperative etiology. 6.  Cholelithiasis. 7. Incomplete visualization of renal cystic lesions with complex mass arising from upper pole right kidney, better seen and characterized on recent abdominal CT examination. These results will be called to the ordering clinician or representative by the Radiologist Assistant, and communication documented in the PACS or [employer]. Electronically Signed   By: Lowella Grip III M.D.   On: 05/01/2020 11:22   CT ABDOMEN PELVIS W CONTRAST  Addendum Date: 05/06/2020   ADDENDUM REPORT: 05/06/2020 22:39 ADDENDUM: These results were called by telephone at the time of interpretation on 05/06/2020 at 10:38 pm to provider Dr. Cyd Silence, who verbally acknowledged these results. Electronically Signed   By: Iven Finn M.D.   On: 05/06/2020 22:39   Result Date: 05/06/2020 CLINICAL DATA:  Intra-abdominal abscess follow-up in a patient with perforated pre-pyloric gastric ulcer status post repair EXAM: CT ABDOMEN AND PELVIS WITH CONTRAST TECHNIQUE: Multidetector CT imaging of the abdomen and pelvis was performed using the standard protocol following bolus administration of intravenous contrast. CONTRAST:  162mL OMNIPAQUE IOHEXOL 300 MG/ML  SOLN COMPARISON:  CT abdomen pelvis 04/30/2020, CT abdomen pelvis 04/20/2020, MR abdomen 04/22/2020 FINDINGS: Lower chest: Interval decrease in residual trace bilateral pleural effusions. Associated atelectasis of the bilateral, right greater than left lower lobes. Hepatobiliary: Interval decrease in size of  a 3.4 x 2.1 x 1.8 cm (from 5.1 x 2.1 x 2cm) subcapsular fluid collection along the right inferior hepatic lobe (3:32). Subcentimeter hypodensity within the right hepatic dome is still too small to characterize. Cholelithiasis again noted. No biliary ductal dilatation. Pancreas: Redemonstration several fluid density lesions with associated calcifications open (3:40) within the head of the pancreas measuring up to 2.2 cm (3:40). The lesions are better evaluated on MRI abdomen 04/22/2020. Otherwise no main pancreatic duct dilatation distally. No abnormal pancreatic contour. Spleen: Normal in size without focal abnormality. Adrenals/Urinary Tract: No adrenal nodule bilaterally. Redemonstration  of bilateral peripelvic cysts. Bilateral subcentimeter hypodensities are too small to characterize. Similar-appearing 2.9 cm fluid density lesion that likely represents a simple renal cyst within the left kidney. Redemonstration of a 2.3 cm heterogeneous exophytic lesion arising from the right kidney (3:34) that likely represents a benign proteinaceous versus hemorrhagic cyst per MR abdomen 04/22/20. Bilateral kidneys enhance symmetrically. No hydronephrosis. No hydroureter. The urinary bladder is unremarkable. Stomach/Bowel: PO contrast is noted to reach the rectum. No PO contrast extravasation. Stomach is within normal limits. Appendix appears normal. No evidence of bowel wall thickening, distention, or inflammatory changes. Vascular/Lymphatic: No significant vascular findings are present. No enlarged abdominal or pelvic lymph nodes. Reproductive: Fibroid uterus.  No adnexal mass. Other: Trace free intraperitoneal fluid. Persistent free intraperitoneal gas within the upper abdomen with stable to slightly increased volume. Interval development of a new 3.4 x 1.8 x 1.9 cm gas and fluid collection just anterior to the gallbladder fundus (3:34) that appears to be continuous with the site of previous pre-pyloric gastric ulcer  perforation (6:48-54). Interval placement of a left pelvic drain that terminates within a known irregular fluid and gas organized collection that has decreased in size with its largest pocket of fluid measuring 8 x 5.1 cm open (from 11 x 5 cm). Musculoskeletal: Suggestion of a cutaneous tract between the new right upper abdomen gas and fluid collection and upper mid subcutaneous soft tissues of the anterior abdomen/incision (3:36, 7:71). Healing anterior abdominal incision with nonspecific soft tissue densities along the peritoneum (3:64, 73) likely related to recent surgery/closure. Mild subcutaneous soft tissue edema. No acute or significant osseous findings. Multilevel degenerative changes of the spine. IMPRESSION: 1. Interval development of a 3.4 x 1.8 x 1.9 cm right upper abdominal gas and fluid collection just anterior to the gallbladder fundus that appears to be continuous with the area of prior pre-pyloric gastric ulcer perforation (and therefore in keeping with a persistent enteric leak). This new subhepatic abscess also appears to be continuous with the upper abdominal subcutaneous soft tissues/incision consistent with a cutaneous fistula. 2. Interval decrease in size of a 3.4 x 2.1 x 1.8 cm subcapsular right hepatic fluid collection. 3. Interval placement of a left pelvic drain with interval decrease in size of a left pelvic gas and fluid collection with the largest pocket now measuring 8 x 5 cm (from 11 x 5 cm). 4. Interval decrease in bilateral trace pleural effusions with bibasilar atelectasis. 5. Other imaging findings of potential clinical significance: Cholelithiasis. Similar-appearing cystic pancreatic head and neck lesions measuring up to 2.2 cm with associated calcifications that are better evaluated on MR abdomen 04/22/2020. Stable 2.3 cm heterogeneous right renal lesion that likely represents a benign proteinaceous versus hemorrhagic cyst per MRI abdomen 04/22/2020. Electronically Signed: By:  Iven Finn M.D. On: 05/06/2020 22:23   CT ABDOMEN PELVIS W CONTRAST  Addendum Date: 05/01/2020   ADDENDUM REPORT: 05/01/2020 08:21 ADDENDUM: Upon further review, there are new thin non-occlusive saddle pulmonary emboli involving the left and right pulmonary arteries with some extension into both lower lobes. Dedicated CTA of the chest is recommended for further evaluation. Critical Value/emergent results were called by telephone on 05/01/2020 at 8:20 am to provider [facility], who verbally acknowledged these results. Electronically Signed   By: Titus Dubin M.D.   On: 05/01/2020 08:21   Result Date: 05/01/2020 CLINICAL DATA:  History of perforated pre-pyloric gastric ulcer status post repair 8 days ago, now with worsening epigastric pain and vomiting. EXAM: CT ABDOMEN AND PELVIS WITH  CONTRAST TECHNIQUE: Multidetector CT imaging of the abdomen and pelvis was performed using the standard protocol following bolus administration of intravenous contrast. CONTRAST:  131mL OMNIPAQUE IOHEXOL 300 MG/ML  SOLN COMPARISON:  MRI abdomen dated April 22, 2020. CT abdomen pelvis dated April 20, 2020. FINDINGS: Lower chest: Unchanged small right and trace left pleural effusions with adjacent lower lobe atelectasis. Hepatobiliary: Unchanged subcentimeter low-density lesion in the hepatic dome, too small to characterize. A small amount of perihepatic fluid with more focal 5.1 x 2.1 x 2.0 cm subcapsular fluid collection along the inferior right liver (series 2, image 31: Series 5, image 70) multiple gallstones again noted. No gallbladder wall thickening or biliary dilatation. Pancreas: Multiple cystic lesions in pancreatic head measuring up to 2.2 cm are unchanged. No ductal dilatation or surrounding inflammatory changes. Spleen: Normal in size without focal abnormality. Adrenals/Urinary Tract: Adrenal glands are unremarkable. Unchanged bilateral renal cysts, including a 1.8 cm complex cyst in the upper pole of the right  kidney as characterized on recent MRI. No renal calculi or hydronephrosis. Bladder is unremarkable. Stomach/Bowel: Unchanged small hiatal hernia. Mildly pre-pyloric gastric wall thickening is likely postsurgical. There are few mildly dilated loops of jejunum in the mid and lower abdomen with gradual transition to ileum. Oral contrast reaches the terminal ileum. No bowel wall thickening or surrounding inflammatory changes. Left-sided colonic diverticulosis. Normal appendix. Vascular/Lymphatic: No significant vascular findings. No enlarged abdominal or pelvic lymph nodes. Reproductive: Unchanged fibroid uterus.  No adnexal mass. Other: New 5.3 x 14.0 x 3.9 cm (AP by transverse by CC) rim enhancing fluid collection in the pelvis anterior to the uterine fundus. There are few residual foci of pneumoperitoneum in the right upper quadrant and deep to the midline abdominal incision. Musculoskeletal: No acute or significant osseous findings. IMPRESSION: 1. New 5.3 x 14.0 x 3.9 cm abscess in the pelvis. 2. Small amount of perihepatic fluid with more focal 5.1 x 2.1 x 2.0 cm subcapsular fluid collection along the inferior right liver, also concerning for abscess. 3. Trace residual pneumoperitoneum in the right upper quadrant and deep to the midline abdominal incision, likely postsurgical. 4. Mildly dilated loops of small bowel in the mid and lower abdomen are favored to represent ileus as oral contrast reaches the terminal ileum. 5. Unchanged cystic lesions in the pancreatic head measuring up to 2.2 cm, better evaluated on recent MRI. Please see that report for follow-up recommendations. 6. Unchanged cholelithiasis. 7. Unchanged small right and trace left pleural effusions. Electronically Signed: By: Titus Dubin M.D. On: 04/30/2020 19:04   CT ABDOMEN PELVIS W CONTRAST  Result Date: 04/20/2020 CLINICAL DATA:  Acute abdominal pain. Elevated bilirubin. Altered mental status. EXAM: CT ABDOMEN AND PELVIS WITH CONTRAST  TECHNIQUE: Multidetector CT imaging of the abdomen and pelvis was performed using the standard protocol following bolus administration of intravenous contrast. CONTRAST:  5mL OMNIPAQUE IOHEXOL 300 MG/ML  SOLN COMPARISON:  No prior abdominal imaging. FINDINGS: Lower chest: Mild hypoventilatory changes in the lung bases. No pleural fluid or confluent airspace disease. Hepatobiliary: 7 mm hypodensity in the right dome of the liver, series 2, image 12, too small to accurately characterize. Multiple cholesterol gallstones in the gallbladder. No pericholecystic inflammation or evidence of wall thickening. No common bile duct dilatation. Pancreas: Coarse calcification in the pancreatic head. There are at least 3 cystic lesions in the pancreatic head/uncinate process, largest measuring 2.2 cm, series 2, image 29. There is an adjacent 1.3 cm cystic structure and a 1 cm cyst more inferiorly. No  definite peripancreatic fat stranding or inflammation. There is no pancreatic ductal dilatation. Spleen: Normal in size without focal abnormality. Adrenals/Urinary Tract: No adrenal nodule. There is a punctate calcification involving the medial limb of the left adrenal gland. Indeterminate lesion arising from the upper right kidney measures 17 x 18 mm has internal heterogeneity and peripheral calcifications. This is adjacent to a small cortical cyst. There are scattered additional cysts within the right kidney as well as parapelvic cysts. Cortical as well as parapelvic cysts in the left kidney. No hydronephrosis. No perinephric edema. Urinary bladder is unremarkable. Stomach/Bowel: Small hiatal hernia with mild distal esophageal wall thickening. Stomach is unremarkable. Normal positioning of the duodenum and ligament of Treitz. There is no small bowel obstruction or inflammatory change. Cecum is high-riding in the right mid abdomen. Normal air-filled appendix. Transverse colonic tortuosity. Colonic diverticulosis involving the distal  descending and sigmoid colon. No acute diverticulitis. Vascular/Lymphatic: Aortic atherosclerosis with mild tortuosity. No aortic aneurysm. The portal vein is patent. There is bi-iliac tortuosity. No bulky enlarged abdominopelvic lymph nodes. Reproductive: Globular enlarged uterus with multiple presumed fibroids, the largest appears projecting posteriorly and measures 5.6 cm. Right ovary tentatively visualized and normal. The left ovary is not definitively seen. Other: No free air or free fluid. Musculoskeletal: Multilevel degenerative change in the spine. Vacuum phenomena at L4-L5 and L5-S1. Prominent facet hypertrophy. Modic endplate changes at M35-T61. There are no acute or suspicious osseous abnormalities. IMPRESSION: 1. Cholelithiasis without gallbladder inflammation. No biliary dilatation. 2. Coarse calcification in the pancreatic head consistent with chronic pancreatitis. There are at least 3 cystic lesions in the pancreatic head/uncinate process, largest measuring 2.2 cm. These may represent sequela of prior pancreatitis and pseudocysts, however recommend further evaluation with pancreatic protocol MRI for further characterization. This should ideally be performed on an elective basis after resolution of acute event when patient is able to tolerate breath hold technique. 3. Indeterminate 17 x 18 mm lesion arising from the upper right kidney has internal heterogeneity and peripheral calcifications. 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 Recommend MRI characterization of this lesion. There are additional simple cysts as well as parapelvic cysts in both kidneys. 4. Globular enlarged uterus with multiple presumed fibroids, the largest measuring 5.6 cm. 5. Small hiatal hernia with mild distal esophageal wall thickening, can be seen with reflux or esophagitis. 6. Colonic diverticulosis without acute inflammation. Aortic Atherosclerosis  (ICD10-I70.0). Electronically Signed   By: Keith Rake M.D.   On: 04/20/2020 23:14   MR 3D Recon At Scanner  Result Date: 04/22/2020 CLINICAL DATA:  Pancreatitis suspected, characterize pancreatic lesions, characterize incidental right renal lesion EXAM: MRI ABDOMEN WITHOUT AND WITH CONTRAST (INCLUDING MRCP) TECHNIQUE: Multiplanar multisequence MR imaging of the abdomen was performed both before and after the administration of intravenous contrast. Heavily T2-weighted images of the biliary and pancreatic ducts were obtained, and three-dimensional MRCP images were rendered by post processing. CONTRAST:  30mL GADAVIST GADOBUTROL 1 MMOL/ML IV SOLN COMPARISON:  CT abdomen pelvis, 04/20/2020 FINDINGS: Lower chest: Small right pleural effusion associated atelectasis or consolidation. Trace left pleural effusion. Hepatobiliary: No mass or other parenchymal abnormality identified. Gallstones in the gallbladder. No gallbladder wall thickening. No biliary ductal dilatation. Pancreas: There is a multicystic fluid signal lesion in the anterior pancreatic head measuring 4.1 x 2.6 x 3.0 cm (series 5, image 26, series 4, image 12). There are multiple associated small calcifications. This is clearly distinct from the pancreatic duct and there  is no pancreatic ductal dilatation. No solid contrast enhancing component. Spleen:  Within normal limits in size and appearance. Adrenals/Urinary Tract: No masses identified. Numerous bilateral renal cysts, predominantly parapelvic although also with several cortical cysts. An exophytic cyst of the superior pole of the right kidney is partially calcified and demonstrates mixed intrinsic signal, but demonstrates no contrast enhancement. Remaining cysts are simple and fluid signal without enhancement. No evidence of hydronephrosis. Stomach/Bowel: There is gastric mucosal thickening of the antrum and pylorus and a possible small ulceration of the anterior gastric antrum (series 5, image  22). Vascular/Lymphatic: No pathologically enlarged lymph nodes identified. No abdominal aortic aneurysm demonstrated. Other:  There is new moderate volume ascites and pneumoperitoneum. Musculoskeletal: No suspicious bone lesions identified. IMPRESSION: 1. There is new moderate volume ascites throughout the abdomen and pneumoperitoneum, concerning for perforated viscus organ. There is gastric mucosal thickening of the antrum and pylorus and a possible small ulceration of the anterior pylorus, suggesting a perforated gastric ulceration although nidus of perforation is not definitively localized. 2. There is a multicystic fluid signal lesion in the anterior pancreatic head measuring 4.1 cm. This is clearly distinct from the pancreatic duct and there are multiple associated small calcifications. Favor pancreatic pseudocyst although mucinous cystic neoplasm is a substantial differential consideration given appearance. Given lesion size recommend consideration of EUS and FNA for diagnosis. 3. No pancreatic ductal dilatation. 4. Numerous bilateral renal cysts, predominantly parapelvic although also with several cortical cysts. An exophytic cyst of the superior pole of the right kidney is partially calcified and demonstrates mixed intrinsic signal, but demonstrates no contrast enhancement. This is consistent with a benign proteinaceous or hemorrhagic cyst. 5. Cholelithiasis. No evidence of acute cholecystitis. No biliary ductal dilatation. These results were called by telephone at the time of interpretation on 04/22/2020 at 10:17 am to Dr. Manuella Ghazi, who verbally acknowledged these results. Electronically Signed   By: Eddie Candle M.D.   On: 04/22/2020 10:31   US Venous Img Lower Bilateral (DVT)  Result Date: 05/01/2020 CLINICAL DATA:  Recent pulmonary embolus EXAM: BILATERAL LOWER EXTREMITY VENOUS DUPLEX ULTRASOUND TECHNIQUE: Gray-scale sonography with graded compression, as well as color Doppler and duplex ultrasound were  performed to evaluate the lower extremity deep venous systems from the level of the common femoral vein and including the common femoral, femoral, profunda femoral, popliteal and calf veins including the posterior tibial, peroneal and gastrocnemius veins when visible. The superficial great saphenous vein was also interrogated. Spectral Doppler was utilized to evaluate flow at rest and with distal augmentation maneuvers in the common femoral, femoral and popliteal veins. COMPARISON:  CT abdomen and pelvis including portions of lower chest April 30, 2020 FINDINGS: RIGHT LOWER EXTREMITY Common Femoral Vein: No evidence of thrombus. Normal compressibility, respiratory phasicity and response to augmentation. Saphenofemoral Junction: No evidence of thrombus. Normal compressibility and flow on color Doppler imaging. Profunda Femoral Vein: No evidence of thrombus. Normal compressibility and flow on color Doppler imaging. Femoral Vein: No evidence of thrombus. Normal compressibility, respiratory phasicity and response to augmentation. Popliteal Vein: No evidence of thrombus. Normal compressibility, respiratory phasicity and response to augmentation. Calf Veins: No evidence of thrombus. Normal compressibility and flow on color Doppler imaging. Superficial Great Saphenous Vein: No evidence of thrombus. Normal compressibility. Venous Reflux:  None. Other Findings:  None. LEFT LOWER EXTREMITY Common Femoral Vein: No evidence of thrombus. Normal compressibility, respiratory phasicity and response to augmentation. Saphenofemoral Junction: No evidence of thrombus. Normal compressibility and flow on color Doppler imaging. Profunda Femoral  Vein: No evidence of thrombus. Normal compressibility and flow on color Doppler imaging. Femoral Vein: No evidence of thrombus. Normal compressibility, respiratory phasicity and response to augmentation. Popliteal Vein: No evidence of thrombus. Normal compressibility, respiratory phasicity and  response to augmentation. Calf Veins: No evidence of thrombus. Normal compressibility and flow on color Doppler imaging. Superficial Great Saphenous Vein: No evidence of thrombus. Normal compressibility. Venous Reflux:  None. Other Findings:  None. IMPRESSION: No evidence of deep venous thrombosis in either lower extremity. Electronically Signed   By: Lowella Grip III M.D.   On: 05/01/2020 10:17   DG Chest Port 1 View  Result Date: 04/30/2020 CLINICAL DATA:  Wheezing and coughing. EXAM: PORTABLE CHEST 1 VIEW COMPARISON:  One-view chest x-ray 04/22/2020 FINDINGS: Enteric tube was removed. Heart is enlarged. Pulmonary vascular congestion is again noted. Aeration and lung volumes are proved bilaterally. Chronic elevation of the right hemidiaphragm is noted. Mild degenerative changes are noted in the cervical spine and shoulders. IMPRESSION: 1. Stable cardiomegaly and mild pulmonary vascular congestion. 2. Improved aeration and lung volumes bilaterally. Electronically Signed   By: San Morelle M.D.   On: 04/30/2020 05:12   DG Chest Port 1 View  Result Date: 04/22/2020 CLINICAL DATA:  Peri op EXAM: PORTABLE CHEST 1 VIEW COMPARISON:  04/22/2020, CT 04/20/2020, MRI 04/22/2020 FINDINGS: Esophageal tube tip overlies the gastric outlet. New cutaneous staples over the right hemiabdomen. Low lung volumes. Borderline cardiomegaly. Patchy atelectasis at the bases. Resolution of previously noted free air beneath the right diaphragm. No pneumothorax. IMPRESSION: 1. Esophageal tube tip overlies the gastric outlet. 2. Low lung volumes with patchy basilar atelectasis. Resolution of previously noted free air beneath the right diaphragm Electronically Signed   By: Donavan Foil M.D.   On: 04/22/2020 20:08   DG CHEST PORT 1 VIEW  Result Date: 04/22/2020 CLINICAL DATA:  Pneumoperitoneum. EXAM: PORTABLE CHEST 1 VIEW COMPARISON:  04/22/2020 MR and prior studies FINDINGS: Pneumoperitoneum underlying the RIGHT  hemidiaphragm is noted, identified on earlier MRI. An NG tube is present with tip overlying the distal stomach. Bilateral LOWER lung opacities/atelectasis are present. There is no evidence of pneumothorax. IMPRESSION: 1. Pneumoperitoneum underlying the RIGHT hemidiaphragm. 2. Bilateral LOWER lung opacities/atelectasis. Electronically Signed   By: Margarette Canada M.D.   On: 04/22/2020 12:43   DG Chest Port 1 View  Result Date: 04/20/2020 CLINICAL DATA:  Altered mental status. EXAM: PORTABLE CHEST 1 VIEW COMPARISON:  September 09, 2014 FINDINGS: Mild, diffuse chronic appearing increased lung markings are seen without evidence of acute infiltrate, pleural effusion or pneumothorax. The cardiac silhouette is mildly enlarged. The visualized skeletal structures are unremarkable. IMPRESSION: Chronic appearing increased lung markings without evidence of acute or active cardiopulmonary disease. Electronically Signed   By: Virgina Norfolk M.D.   On: 04/20/2020 21:05   DG UGI W SINGLE CM (SOL OR THIN BA)  Result Date: 04/25/2020 CLINICAL DATA:  Perforated prepyloric ulcer due to naproxen, post repair EXAM: WATER SOLUBLE UPPER GI SERIES TECHNIQUE: Single-column upper GI series was performed using water soluble contrast. CONTRAST:  130mL ISOVUE-300 IOPAMIDOL (ISOVUE-300) INJECTION 61% orally COMPARISON:  CT abdomen and pelvis 04/20/2020 FLUOROSCOPY TIME:  Fluoroscopy Time:  1 minutes 18 seconds Radiation Exposure Index (if provided by the fluoroscopic device): 49.5 mGy Number of Acquired Spot Images: 8 + multiple fluoroscopic screen captures FINDINGS: Scout image demonstrates tip of nasogastric tube projecting over stomach. JP drain projects over duodenal bulb, pylorus, and prepyloric region. Skin clips from upper abdominal laparotomy. Gaseous distention of  stomach. Contrast opacifies the gastric lumen and with patient turned to RIGHT lateral decubitus position, empties from the stomach into duodenum. No evidence of gastric  outlet obstruction. Mild bowel wall thickening of the pylorus, duodenal bulb and pre pyloric mucosa. No extraluminal contrast extravasation identified. No contrast is seen along the JP drain. IMPRESSION: Edema at the surgical region at the pylorus, pre pyloric, and duodenal bulb regions. No evidence of contrast extravasation. Electronically Signed   By: Lavonia Dana M.D.   On: 04/25/2020 10:11   MR ABDOMEN MRCP W WO CONTAST  Result Date: 04/22/2020 CLINICAL DATA:  Pancreatitis suspected, characterize pancreatic lesions, characterize incidental right renal lesion EXAM: MRI ABDOMEN WITHOUT AND WITH CONTRAST (INCLUDING MRCP) TECHNIQUE: Multiplanar multisequence MR imaging of the abdomen was performed both before and after the administration of intravenous contrast. Heavily T2-weighted images of the biliary and pancreatic ducts were obtained, and three-dimensional MRCP images were rendered by post processing. CONTRAST:  18mL GADAVIST GADOBUTROL 1 MMOL/ML IV SOLN COMPARISON:  CT abdomen pelvis, 04/20/2020 FINDINGS: Lower chest: Small right pleural effusion associated atelectasis or consolidation. Trace left pleural effusion. Hepatobiliary: No mass or other parenchymal abnormality identified. Gallstones in the gallbladder. No gallbladder wall thickening. No biliary ductal dilatation. Pancreas: There is a multicystic fluid signal lesion in the anterior pancreatic head measuring 4.1 x 2.6 x 3.0 cm (series 5, image 26, series 4, image 12). There are multiple associated small calcifications. This is clearly distinct from the pancreatic duct and there is no pancreatic ductal dilatation. No solid contrast enhancing component. Spleen:  Within normal limits in size and appearance. Adrenals/Urinary Tract: No masses identified. Numerous bilateral renal cysts, predominantly parapelvic although also with several cortical cysts. An exophytic cyst of the superior pole of the right kidney is partially calcified and demonstrates mixed  intrinsic signal, but demonstrates no contrast enhancement. Remaining cysts are simple and fluid signal without enhancement. No evidence of hydronephrosis. Stomach/Bowel: There is gastric mucosal thickening of the antrum and pylorus and a possible small ulceration of the anterior gastric antrum (series 5, image 22). Vascular/Lymphatic: No pathologically enlarged lymph nodes identified. No abdominal aortic aneurysm demonstrated. Other:  There is new moderate volume ascites and pneumoperitoneum. Musculoskeletal: No suspicious bone lesions identified. IMPRESSION: 1. There is new moderate volume ascites throughout the abdomen and pneumoperitoneum, concerning for perforated viscus organ. There is gastric mucosal thickening of the antrum and pylorus and a possible small ulceration of the anterior pylorus, suggesting a perforated gastric ulceration although nidus of perforation is not definitively localized. 2. There is a multicystic fluid signal lesion in the anterior pancreatic head measuring 4.1 cm. This is clearly distinct from the pancreatic duct and there are multiple associated small calcifications. Favor pancreatic pseudocyst although mucinous cystic neoplasm is a substantial differential consideration given appearance. Given lesion size recommend consideration of EUS and FNA for diagnosis. 3. No pancreatic ductal dilatation. 4. Numerous bilateral renal cysts, predominantly parapelvic although also with several cortical cysts. An exophytic cyst of the superior pole of the right kidney is partially calcified and demonstrates mixed intrinsic signal, but demonstrates no contrast enhancement. This is consistent with a benign proteinaceous or hemorrhagic cyst. 5. Cholelithiasis. No evidence of acute cholecystitis. No biliary ductal dilatation. These results were called by telephone at the time of interpretation on 04/22/2020 at 10:17 am to Dr. Manuella Ghazi, who verbally acknowledged these results. Electronically Signed   By:  Eddie Candle M.D.   On: 04/22/2020 10:31   ECHOCARDIOGRAM COMPLETE  Result Date: 05/02/2020  ECHOCARDIOGRAM REPORT   Patient Name:   Maria Murphy Date of Exam: 05/02/2020 Medical Rec #:  [id number]      Height:       64.0 in Accession #:    [id number]     Weight:       224.9 lb Date of Birth:  05-Jan-1956      BSA:          2.056 m Patient Age:    54 years       BP:           143/79 mmHg Patient Gender: F              HR:           71 bpm. Exam Location:  Inpatient Procedure: 2D Echo, Cardiac Doppler and Color Doppler Indications:    Pulmonary Embolus  History:        Patient has no prior history of Echocardiogram examinations.                 Risk Factors:Hypertension and Obesity. Pulmonary embolus, s/p GI                 surgery.  Sonographer:    Dustin Flock Referring Phys: [id number] [place]  1. Left ventricular ejection fraction, by estimation, is 60 to 65%. The left ventricle has normal function. The left ventricle has no regional wall motion abnormalities. Left ventricular diastolic parameters were normal. There is the interventricular septum is flattened in systole, consistent with right ventricular pressure overload.  2. Right ventricular systolic function is normal. The right ventricular size is mildly enlarged. There is moderately elevated pulmonary artery systolic pressure. The estimated right ventricular systolic pressure is 24.2 mmHg.  3. The mitral valve is normal in structure. Trivial mitral valve regurgitation. No evidence of mitral stenosis.  4. The aortic valve is tricuspid. Aortic valve regurgitation is not visualized. No aortic stenosis is present. Comparison(s): No prior Echocardiogram. FINDINGS  Left Ventricle: Left ventricular ejection fraction, by estimation, is 60 to 65%. The left ventricle has normal function. The left ventricle has no regional wall motion abnormalities. The left ventricular internal cavity size was normal in size. There is  no left ventricular  hypertrophy. The interventricular septum is flattened in systole, consistent with right ventricular pressure overload. Left ventricular diastolic parameters were normal. Right Ventricle: The right ventricular size is mildly enlarged. No increase in right ventricular wall thickness. Right ventricular systolic function is normal. There is moderately elevated pulmonary artery systolic pressure. The tricuspid regurgitant velocity is 3.28 m/s, and with an assumed right atrial pressure of 8 mmHg, the estimated right ventricular systolic pressure is 35.3 mmHg. Left Atrium: Left atrial size was normal in size. Right Atrium: Right atrial size was normal in size. Pericardium: There is no evidence of pericardial effusion. Mitral Valve: The mitral valve is normal in structure. Trivial mitral valve regurgitation. No evidence of mitral valve stenosis. Tricuspid Valve: The tricuspid valve is normal in structure. Tricuspid valve regurgitation is trivial. No evidence of tricuspid stenosis. Aortic Valve: The aortic valve is tricuspid. Aortic valve regurgitation is not visualized. No aortic stenosis is present. Pulmonic Valve: The pulmonic valve was not well visualized. Pulmonic valve regurgitation is not visualized. Aorta: The aortic root and ascending aorta are structurally normal, with no evidence of dilitation. Venous: The inferior vena cava was not well visualized. IAS/Shunts: The atrial septum is grossly normal.  LEFT VENTRICLE PLAX 2D LVIDd:  5.10 cm  Diastology LVIDs:         3.10 cm  LV e' medial:    12.70 cm/s LV PW:         1.00 cm  LV E/e' medial:  6.7 LV IVS:        1.00 cm  LV e' lateral:   10.90 cm/s LVOT diam:     2.40 cm  LV E/e' lateral: 7.8 LV SV:         118 LV SV Index:   57 LVOT Area:     4.52 cm  RIGHT VENTRICLE RV Basal diam:  3.20 cm RV S prime:     21.30 cm/s TAPSE (M-mode): 3.8 cm LEFT ATRIUM             Index       RIGHT ATRIUM           Index LA diam:        3.70 cm 1.80 cm/m  RA Area:     15.60  cm LA Vol (A2C):   51.8 ml 25.19 ml/m RA Volume:   38.90 ml  18.92 ml/m LA Vol (A4C):   38.3 ml 18.63 ml/m LA Biplane Vol: 49.0 ml 23.83 ml/m  AORTIC VALVE LVOT Vmax:   126.00 cm/s LVOT Vmean:  84.700 cm/s LVOT VTI:    0.261 m  AORTA Ao Root diam: 2.90 cm MITRAL VALVE               TRICUSPID VALVE MV Area (PHT): 5.66 cm    TR Peak grad:   43.0 mmHg MV Decel Time: 134 msec    TR Vmax:        328.00 cm/s MV E velocity: 85.30 cm/s MV A velocity: 93.00 cm/s  SHUNTS MV E/A ratio:  0.92        Systemic VTI:  0.26 m                            Systemic Diam: 2.40 cm Buford Dresser MD Electronically signed by Buford Dresser MD Signature Date/Time: 05/02/2020/6:06:31 PM    Final    CT IMAGE GUIDED FLUID DRAIN BY CATHETER  Result Date: 05/02/2020 INDICATION: 64 year old female approximately 8 days status post open surgical repair of a perforated pre-pyloric gastric ulcer. Recent CT imaging demonstrated new accumulation of a fluid collection in the dependent anatomic pelvis anterior to the uterus and superior to the bladder. She presents for CT-guided drain placement. EXAM: CT-guided drain placement MEDICATIONS: The patient is currently admitted to the hospital and receiving intravenous antibiotics. The antibiotics were administered within an appropriate time frame prior to the initiation of the procedure. ANESTHESIA/SEDATION: Fentanyl 100 mcg IV; Versed 2.5 mg IV Moderate Sedation Time:  27 minutes The patient was continuously monitored during the procedure by the interventional radiology nurse under my direct supervision. COMPLICATIONS: None immediate. PROCEDURE: Informed written consent was obtained from the patient after a thorough discussion of the procedural risks, benefits and alternatives. All questions were addressed. Maximal Sterile Barrier Technique was utilized including caps, mask, sterile gowns, sterile gloves, sterile drape, hand hygiene and skin antiseptic. A timeout was performed prior to  the initiation of the procedure. A planning axial CT scan was performed. The fluid collection was successfully identified. There is a very small window approaching from inferior to the sigmoid colon. A skin entry site was selected and marked. The overlying skin was sterilely prepped and draped in the standard fashion  using chlorhexidine skin prep. Local anesthesia was attained by infiltration with 1% lidocaine. A small dermatotomy was made. Under intermittent CT guidance, an 18 gauge trocar needle was carefully advanced under the sigmoid colon and into the fluid collection. A 0.035 wire was coiled in the fluid collection. The skin tract was dilated to 12 [place]. A Cook 12 [place] all-purpose drainage catheter was then advanced over the wire and formed. Aspiration yields approximately 30 mL of purulent appearing fluid. The fluid was sent for Gram stain and culture. The drainage catheter was gently flushed and secured to the skin with 0 Prolene suture and an adhesive fixation device. The patient tolerated the procedure well. IMPRESSION: Successful placement of a 12 French drainage catheter into the dependent low abdominal fluid collection. Aspiration yields 30 mL of purulent appearing fluid which was sent for Gram stain and culture. PLAN: 1. Maintain drain to JP bulb suction. 2. Flush drainage catheter with 10 mL saline at least once per shift. 3. When drain output is minimal (serous and less than 15 mL per day for at least 48 hours) in the patient is clinically improved, the drainage catheter can be removed. Signed, Criselda Peaches, MD, [facility] Electronically Signed   By: Jacqulynn Cadet M.D.   On: 05/02/2020 09:36    Labs:  CBC: Recent Labs    05/03/20 0242 05/04/20 0146 05/05/20 0332 05/07/20 0330  WBC 12.2* 12.0* 9.0 7.1  HGB 10.5* 10.1* 10.1* 11.0*  HCT 30.8* 30.7* 31.0* 32.6*  PLT 210 231 219 231    COAGS: Recent Labs     05/01/20 0818  INR 1.2    BMP: Recent Labs    05/03/20 0242 05/04/20 0146 05/05/20 0332 05/07/20 0330  NA 135 135 135 134*  K 3.3* 3.2* 4.0 4.2  CL 101 99 102 99  CO2 25 25 25 26   GLUCOSE 108* 123* 138* 116*  BUN 5* 7* 7* 7*  CALCIUM 7.9* 8.0* 8.3* 8.6*  CREATININE 0.72 0.66 0.77 0.83  GFRNONAA >60 >60 >60 >60  GFRAA >60 >60 >60 >60    LIVER FUNCTION TESTS: Recent Labs    04/23/20 0451 04/24/20 0442 05/02/20 0130 05/07/20 0330  BILITOT 0.9 0.8 0.7 0.6  AST 59* 25 25 40  ALT 22 14 15 22   ALKPHOS 26* 23* 64 54  PROT 6.4* 5.3* 5.4* 6.7  ALBUMIN 3.2* 2.5* 2.0* 2.4*    TUMOR MARKERS: No results for input(s): AFPTM, CEA, CA199, CHROMGRNA in the last 8760 hours.  Assessment and Plan:  Sub hepatic fluid collection For aspiration in IR today Risks and benefits of aspiration of sub hepatic fluid collection was discussed with the patient and/or patient's family including, but not limited to bleeding, infection, damage to adjacent structures or low yield requiring additional tests.  All of the questions were answered and there is agreement to proceed. Consent signed and in chart.   Thank you for this interesting consult.  I greatly enjoyed meeting Anniemae Haberkorn and look forward to participating in their care.  A copy of this report was sent to the requesting provider on this date.  Electronically Signed: Lavonia Drafts, PA-C 05/07/2020, 10:38 AM   I spent a total of 40 Minutes    in face to face in clinical consultation, greater than 50% of which was counseling/coordinating care for abd fluid collection aspiration

## 2020-05-07 NOTE — Sedation Documentation (Signed)
Patient is resting comfortably. 

## 2020-05-07 NOTE — Sedation Documentation (Addendum)
Transported pt to CT. Assisted pt to CT table with assistance. Pt tolerated well. Iamges taken for procedure. Pt is resting at this time awaiting MD. Vitals are stable. Consent is signed by pt, pt is NPO, alert and oriented x4.

## 2020-05-07 NOTE — Sedation Documentation (Signed)
Pt tolerated procedure very well. Total time 10 mins Versed 1mg  Fentanyl 22mcg

## 2020-05-07 NOTE — Progress Notes (Addendum)
PROGRESS NOTE    Maria Murphy  VPX:[id number] DOB: 16-Jul-1956 DOA: 04/21/2020 PCP: Avon Gully, MD    Brief Narrative:  [facility]),for further management multifocal/saddlepulmonary embolism.[facility]ially admitted to [facility] on 8/29 with the working diagnosis of perforated prepyloric gastric ulcer.  64 year old female, [facility],with the working diagnosis biliary pancreatitis,chronic cholecystitis/cholelithiasis. Patient underwent further work-up with MRCP, which showed free fluid in the abdomen and pneumoperitoneum. Patient was placed on broad-spectrum antibiotic therapy and vasopressors, she underwent emergent laparotomy. Patient was found to have a perforation in the prepyloric area along the lesser curvature of the stomach and antrum, and a Cheree Ditto application was performed. Surgical culture positive for Enterococcus and Candida. Patient was stabilized with antibiotic therapy,butshe developed worsening leukocytosis.  A CT of the abdomen pelvis on 9/7 showed a new 5.3 x 14 x 3.9 cm abscess in the pelvis,5.1 x 2.6 x 2.0 subcapsular fluid collection along the right inferior liver, also concerning for abscess. CT of the chest with multifocal pulmonary embolus, pulmonary emboli arising in the saddledistribution from each main pulmonary artery with extension into multiple lower lobe point arteries bilaterally. Positive right heart strain,consistent with submassive.  She underwent 12 F drain placement into the low abdominopelvic fluid collection. Culture positive for enterococcus and candida.  Patient has been placed on heparin drip with good toleration. Patient very weak and deconditioned.  Echocardiogram with preserved RV function, now transitioned to oral anticoagulation with good toleration.  Patient continue to be very weak and deconditioned, plan to transfer to SNF.   Follow up CT abdomen and pelvis (09/13), after  completing 2 weeks of antibiotic therapy with development of 3.4x1.8x.19 right upper abdominal gas and fluid collection in the area of prior pre-pyloric gastric ulcer perforation. New subhepatic abscess, with cutaneous fistula.    Assessment & Plan:   Principal Problem:   Perforated abdominal viscus Active Problems:   Essential hypertension, benign   Hyperbilirubinemia   Epigastric pain   Perforated viscus   Peptic ulcer with perforation (HCC)   Hypotension   Acute saddle pulmonary embolism without acute cor pulmonale (HCC)   1. Submassive acute pulmonary embolism/ saddle with multiple bilateral embolus/ acute hypoxic respiratory failure.echocardiogram with preserved LV and RV function. Patient was transitioned to rivaroxaban with good toleration, in the setting of new abdominal collection/ abscess that will need surgical intervention, will change back to heparin drip per pharmacy protocol.   2. Abdominal abscess pelvic and hepatic/ enterococcus and candida/ sp IR drainage.CT with development of new collection at the site of prepyloric ulcer perforation, possible peritonitis. New subhepatic abscess. Wbc this am is 7,1.   Will resume antibiotic therapy with Zosyn and Fluconazole, surgery has been consulted for further management.   Patient is no NPO, will add IV fluids with dextrose.   3. Perforated gastric ulcer/ peritonitis/ sp laparotomy and graham application.Will change back to IV pantoprazole, continue pain control with IV morphine. Patient no NPO for possible surgical intervention.   4.HTN.holding blood pressure medications.   5. Depression.As PRNlorazepam.  6. Hypokalemia. Stable renal function with serum cr at 0,83 with K at 4,2 and serum bicarbonate at 26. Will add IV fluids with balanced electrolyte solutions and dextrose.    Patient continue to be at high risk for worsening peritonitis.   Status is: Inpatient  Remains inpatient appropriate  because:Inpatient level of care appropriate due to severity of illness   Dispo: The patient is from: Home  Anticipated d/c is to: SNF              Anticipated d/c date is: > 3 days              Patient currently is not medically stable to d/c.   DVT prophylaxis: Heparin drip   Code Status:   full  Family Communication:  No family at the bedside     Consultants:   Surgery   ID   IR    Procedures:   Laparotomy   IR pelvic drain to abscess.   Antimicrobials:   Zosyn and fluconazole.   Subjective: Patient with new worsening abdominal pain, improved with analgesics, no nausea or vomiting, has been placed NPO since last night.   Objective: Vitals:   05/06/20 2018 05/07/20 0006 05/07/20 0427 05/07/20 0801  BP: (!) 150/92 (!) 136/95 112/80 129/82  Pulse: 92 99 90 86  Resp: 20 20 15 20   Temp: 98.4 F (36.9 C) 98.7 F (37.1 C) 98.4 F (36.9 C) 98.5 F (36.9 C)  TempSrc: Oral Oral Oral Oral  SpO2: 100% 97% 92% 95%  Weight:      Height:        Intake/Output Summary (Last 24 hours) at 05/07/2020 0832 Last data filed at 05/07/2020 0603 Gross per 24 hour  Intake --  Output 700 ml  Net -700 ml   Filed Weights   04/21/20 1352 04/22/20 0037 04/25/20 0424  Weight: 90.7 kg 90.5 kg 102 kg    Examination:   General: Not in pain or dyspnea, deconditioned  Neurology: Awake and alert, non focal  E ENT: mild pallor, no icterus, oral mucosa moist Cardiovascular: No JVD. S1-S2 present, rhythmic, no gallops, rubs, or murmurs. No lower extremity edema. Pulmonary: positive breath sounds bilaterally, adequate air movement, no wheezing, rhonchi or rales. Gastrointestinal. Abdomen mild distended and tender to superficial palpation, no rebound. Skin. No rashes Musculoskeletal: no joint deformities     Data Reviewed: I have personally reviewed following labs and imaging studies  CBC: Recent Labs  Lab 05/02/20 0130 05/03/20 0242 05/04/20 0146  05/05/20 0332 05/07/20 0330  WBC 14.1* 12.2* 12.0* 9.0 7.1  NEUTROABS 11.6* 9.9*  --   --   --   HGB 10.5* 10.5* 10.1* 10.1* 11.0*  HCT 31.2* 30.8* 30.7* 31.0* 32.6*  MCV 89.1 88.5 87.0 88.1 87.6  PLT 203 210 231 219 231   Basic Metabolic Panel: Recent Labs  Lab 05/02/20 0130 05/03/20 0242 05/04/20 0146 05/05/20 0332 05/07/20 0330  NA 135 135 135 135 134*  K 4.0 3.3* 3.2* 4.0 4.2  CL 98 101 99 102 99  CO2 27 25 25 25 26   GLUCOSE 109* 108* 123* 138* 116*  BUN 6* 5* 7* 7* 7*  CREATININE 0.75 0.72 0.66 0.77 0.83  CALCIUM 7.9* 7.9* 8.0* 8.3* 8.6*  MG  --   --   --  1.8  --   PHOS 3.3  --   --   --   --    GFR: Estimated Creatinine Clearance: 80.6 mL/min (by C-G formula based on SCr of 0.83 mg/dL). Liver Function Tests: Recent Labs  Lab 05/02/20 0130 05/07/20 0330  AST 25 40  ALT 15 22  ALKPHOS 64 54  BILITOT 0.7 0.6  PROT 5.4* 6.7  ALBUMIN 2.0* 2.4*   No results for input(s): LIPASE, AMYLASE in the last 168 hours. No results for input(s): AMMONIA in the last 168 hours. Coagulation Profile: Recent Labs  Lab 05/01/20 0818  INR 1.2   Cardiac Enzymes: No results for input(s): CKTOTAL, CKMB, CKMBINDEX, TROPONINI in the last 168 hours. BNP (last 3 results) No results for input(s): PROBNP in the last 8760 hours. HbA1C: No results for input(s): HGBA1C in the last 72 hours. CBG: Recent Labs  Lab 05/05/20 1621  GLUCAP 133*   Lipid Profile: No results for input(s): CHOL, HDL, LDLCALC, TRIG, CHOLHDL, LDLDIRECT in the last 72 hours. Thyroid Function Tests: No results for input(s): TSH, T4TOTAL, FREET4, T3FREE, THYROIDAB in the last 72 hours. Anemia Panel: No results for input(s): VITAMINB12, FOLATE, FERRITIN, TIBC, IRON, RETICCTPCT in the last 72 hours.    Radiology Studies: I have reviewed all of the imaging during this hospital visit personally     Scheduled Meds: . Chlorhexidine Gluconate Cloth  6 each Topical Daily  . Gerhardt's butt cream   Topical  Daily  . pantoprazole (PROTONIX) IV  40 mg Intravenous Q12H  . sodium chloride flush  5 mL Intracatheter Q8H   Continuous Infusions: . sodium chloride 1,000 mL (05/07/20 0012)  . fluconazole (DIFLUCAN) IV    . lactated ringers 125 mL/hr at 05/07/20 0829  . piperacillin-tazobactam (ZOSYN)  IV 3.375 g (05/07/20 0013)     LOS: 15 days        Branda Chaudhary Annett Gula, MD

## 2020-05-07 NOTE — Progress Notes (Signed)
Pt received from IR. VSS. Clear liquid tray ordered.  Clyde Canterbury, RN

## 2020-05-07 NOTE — Consult Note (Signed)
[facility] Surgery Consult Note  Maria Murphy 06/14/1956  [id number].    Requesting MD: Arrien  Chief Complaint/Reason for Consult: possible leak around repair of gastric ulcer HPI:  Patient is a 64 year old female with history of HTN and depression who was admitted to [facility] 8/29 with abdominal pain. Initial concern was for biliary source of abdominal pain and then on MRCP patient was noted to have pneumoperitoneum from presumed peptic ulcer perforation. Patient developed some hypotension and had to be transferred to ICU and started on pressors, after some resuscitation was taken to the OR for exploratory laparotomy and underwent Graham patch repair of gastric ulcer 8/30. UGI obtained 9/2 and was negative for leak at that time, she was started on a diet. Placement at SNF was planned. Patient complained of more abdominal pain 9/8 and was requiring increased oxygen, CTs showed pelvic abscess and acute PE. IR was consulted for drainage of pelvic abscess and transfer to [facility] requested given acute PE. General surgery was not consulted once she arrived at [facility] as post-operative state was felt to be stable. Follow up CT was checked prior to planned discharge 9/13 and showed new collection around prior repair of gastric ulcer and surgery was consulted to see.    ROS: Review of Systems  Constitutional: Negative for chills and fever.  Respiratory: Positive for shortness of breath. Negative for wheezing.   Cardiovascular: Negative for chest pain and palpitations.  Gastrointestinal: Positive for abdominal pain and constipation. Negative for nausea and vomiting.  Genitourinary: Negative for dysuria, frequency and urgency.  All other systems reviewed and are negative.   Family History  Problem Relation Age of Onset  . High Cholesterol Mother   . Hypertension Mother   . Arthritis/Rheumatoid Mother   . Alcohol abuse Father   . Colon cancer Neg Hx   . Colon polyps  Neg Hx     Past Medical History:  Diagnosis Date  . Essential hypertension, benign 01/24/2015  . Foot ulcer ([place])   . Hypertension   . Memory loss   . Skin cancer   . Vision abnormalities     Past Surgical History:  Procedure Laterality Date  . CENTRAL LINE INSERTION Right 04/22/2020   Procedure: CENTRAL LINE INSERTION;  Surgeon: Virl Cagey, MD;  Location: [facility];  Service: General;  Laterality: Right;  . GASTRORRHAPHY  04/22/2020   Procedure: GASTRORRHAPHY;  Surgeon: Virl Cagey, MD;  Location: [facility]S;  Service: General;;  . LAPAROTOMY N/A 04/22/2020   Procedure: EXPLORATORY LAPAROTOMY;  Surgeon: Virl Cagey, MD;  Location: [facility]S;  Service: General;  Laterality: N/A;  . TONSILLECTOMY      Social History:  reports that she has never smoked. She has never used smokeless tobacco. She reports that she does not drink alcohol and does not use drugs.  Allergies:  Allergies  Allergen Reactions  . Wheat Extract Swelling    Medications Prior to Admission  Medication Sig Dispense Refill  . acetaminophen (TYLENOL) 500 MG tablet Take 1 tablet (500 mg total) by mouth every 6 (six) hours as needed. 30 tablet 0  . citalopram (CELEXA) 20 MG tablet Take 1 tablet (20 mg total) by mouth daily. 30 tablet 0  . donepezil (ARICEPT) 10 MG tablet Take 5-10 mg by mouth in the morning and at bedtime.     . gabapentin (NEURONTIN) 300 MG capsule Take 300 mg by mouth 2 (two) times daily.     Marland Kitchen lisinopril-hydrochlorothiazide (PRINZIDE,ZESTORETIC)  10-12.5 MG tablet Take 1 tablet by mouth daily.    . traZODone (DESYREL) 100 MG tablet Take 200 mg by mouth at bedtime.     . Ginger, Zingiber officinalis, (GINGER PO) Take 1 tablet by mouth daily.     . methocarbamol (ROBAXIN) 500 MG tablet Take 1 tablet (500 mg total) by mouth every 8 (eight) hours as needed for muscle spasms. (Patient not taking: Reported on 04/22/2020) 20 tablet 0  . naproxen (NAPROSYN) 500 MG tablet Take 1 tablet (500 mg  total) by mouth 2 (two) times daily as needed. (Patient not taking: Reported on 04/22/2020) 30 tablet 0    Blood pressure 129/82, pulse 86, temperature 98.5 F (36.9 C), temperature source Oral, resp. rate 20, height 5\' 4"  (1.626 m), weight 102 kg, SpO2 95 %. Physical Exam:  General: pleasant, WD, obese female who is sitting up in cha HEENT: Sclera are noninjected.  PERRL.  Ears and nose without any masses or lesions.  Mouth is pink and moist Heart: regular, rate, and rhythm.  Normal s1,s2. No obvious murmurs, gallops, or rubs noted.  Palpable radial and pedal pulses bilaterally Lungs: CTAB, no wheezes, rhonchi, or rales noted.  Respiratory effort nonlabored Abd: soft, NT, ND, +BS, midline incision with small opening superiorly with some purulent drainage MS: all 4 extremities are symmetrical with no cyanosis, clubbing, or edema. Skin: warm and dry with no masses, lesions, or rashes Neuro: Cranial nerves 2-12 grossly intact, sensation grossly intact  Psych: A&Ox3 with an appropriate affect.   Results for orders placed or performed during the hospital encounter of 04/21/20 (from the past 48 hour(s))  Glucose, capillary     Status: Abnormal   Collection Time: 05/05/20  4:21 PM  Result Value Ref Range   Glucose-Capillary 133 (H) 70 - 99 mg/dL    Comment: Glucose reference range applies only to samples taken after fasting for at least 8 hours.  SARS Coronavirus 2 by RT PCR (hospital order, performed in [facility] hospital lab) Nasopharyngeal Nasopharyngeal Swab     Status: None   Collection Time: 05/05/20  6:00 PM   Specimen: Nasopharyngeal Swab  Result Value Ref Range   SARS Coronavirus 2 NEGATIVE NEGATIVE    Comment: (NOTE) SARS-CoV-2 target nucleic acids are NOT DETECTED.  The SARS-CoV-2 RNA is generally detectable in upper and lower respiratory specimens during the acute phase of infection. The lowest concentration of SARS-CoV-2 viral copies this assay can detect is 250 copies / mL.  A negative result does not preclude SARS-CoV-2 infection and should not be used as the sole basis for treatment or other patient management decisions.  A negative result may occur with improper specimen collection / handling, submission of specimen other than nasopharyngeal swab, presence of viral mutation(s) within the areas targeted by this assay, and inadequate number of viral copies (<250 copies / mL). A negative result must be combined with clinical observations, patient history, and epidemiological information.  Fact Sheet for Patients:   StrictlyIdeas.no  Fact Sheet for Healthcare Providers: BankingDealers.co.za  This test is not yet approved or  cleared by the [place] FDA and has been authorized for detection and/or diagnosis of SARS-CoV-2 by FDA under an Emergency Use Authorization (EUA).  This EUA will remain in effect (meaning this test can be used) for the duration of the COVID-19 declaration under Section 564(b)(1) of the Act, 21 U.S.C. section 360bbb-3(b)(1), unless the authorization is terminated or revoked sooner.  Performed at [facility], [place]  [address], [place], [place] [address]   CBC     Status: Abnormal   Collection Time: 05/07/20  3:30 AM  Result Value Ref Range   WBC 7.1 4.0 - 10.5 K/uL   RBC 3.72 (L) 3.87 - 5.11 MIL/uL   Hemoglobin 11.0 (L) 12.0 - 15.0 g/dL   HCT 32.6 (L) 36 - 46 %   MCV 87.6 80.0 - 100.0 fL   MCH 29.6 26.0 - 34.0 pg   MCHC 33.7 30.0 - 36.0 g/dL   RDW 13.5 11.5 - 15.5 %   Platelets 231 150 - 400 K/uL   nRBC 0.0 0.0 - 0.2 %    Comment: Performed at [facility], [place] [address], [place], [place] [address]  Comprehensive metabolic panel     Status: Abnormal   Collection Time: 05/07/20  3:30 AM  Result Value Ref Range   Sodium 134 (L) 135 - 145 mmol/L   Potassium 4.2 3.5 - 5.1 mmol/L   Chloride 99 98 - 111 mmol/L   CO2 26 22 - 32 mmol/L   Glucose, Bld 116 (H) 70 - 99  mg/dL    Comment: Glucose reference range applies only to samples taken after fasting for at least 8 hours.   BUN 7 (L) 8 - 23 mg/dL   Creatinine, Ser 0.83 0.44 - 1.00 mg/dL   Calcium 8.6 (L) 8.9 - 10.3 mg/dL   Total Protein 6.7 6.5 - 8.1 g/dL   Albumin 2.4 (L) 3.5 - 5.0 g/dL   AST 40 15 - 41 U/L   ALT 22 0 - 44 U/L   Alkaline Phosphatase 54 38 - 126 U/L   Total Bilirubin 0.6 0.3 - 1.2 mg/dL   GFR calc non Af Amer >60 >60 mL/min   GFR calc Af Amer >60 >60 mL/min   Anion gap 9 5 - 15    Comment: Performed at [facility] [address] [place], [place] [address]  Prealbumin     Status: Abnormal   Collection Time: 05/07/20  3:30 AM  Result Value Ref Range   Prealbumin 9.4 (L) 18 - 38 mg/dL    Comment: Performed at [facility] [address], [place], [place] [address]   CT ABDOMEN PELVIS W CONTRAST  Addendum Date: 05/06/2020   ADDENDUM REPORT: 05/06/2020 22:39 ADDENDUM: These results were called by telephone at the time of interpretation on 05/06/2020 at 10:38 pm to provider Dr. Cyd Silence, who verbally acknowledged these results. Electronically Signed   By: Iven Finn M.D.   On: 05/06/2020 22:39   Result Date: 05/06/2020 CLINICAL DATA:  Intra-abdominal abscess follow-up in a patient with perforated pre-pyloric gastric ulcer status post repair EXAM: CT ABDOMEN AND PELVIS WITH CONTRAST TECHNIQUE: Multidetector CT imaging of the abdomen and pelvis was performed using the standard protocol following bolus administration of intravenous contrast. CONTRAST:  151mL OMNIPAQUE IOHEXOL 300 MG/ML  SOLN COMPARISON:  CT abdomen pelvis 04/30/2020, CT abdomen pelvis 04/20/2020, MR abdomen 04/22/2020 FINDINGS: Lower chest: Interval decrease in residual trace bilateral pleural effusions. Associated atelectasis of the bilateral, right greater than left lower lobes. Hepatobiliary: Interval decrease in size of a 3.4 x 2.1 x 1.8 cm (from 5.1 x 2.1 x 2cm) subcapsular fluid collection along the  right inferior hepatic lobe (3:32). Subcentimeter hypodensity within the right hepatic dome is still too small to characterize. Cholelithiasis again noted. No biliary ductal dilatation. Pancreas: Redemonstration several fluid density lesions with associated calcifications open (3:40) within the head of the pancreas  measuring up to 2.2 cm (3:40). The lesions are better evaluated on MRI abdomen 04/22/2020. Otherwise no main pancreatic duct dilatation distally. No abnormal pancreatic contour. Spleen: Normal in size without focal abnormality. Adrenals/Urinary Tract: No adrenal nodule bilaterally. Redemonstration of bilateral peripelvic cysts. Bilateral subcentimeter hypodensities are too small to characterize. Similar-appearing 2.9 cm fluid density lesion that likely represents a simple renal cyst within the left kidney. Redemonstration of a 2.3 cm heterogeneous exophytic lesion arising from the right kidney (3:34) that likely represents a benign proteinaceous versus hemorrhagic cyst per MR abdomen 04/22/20. Bilateral kidneys enhance symmetrically. No hydronephrosis. No hydroureter. The urinary bladder is unremarkable. Stomach/Bowel: PO contrast is noted to reach the rectum. No PO contrast extravasation. Stomach is within normal limits. Appendix appears normal. No evidence of bowel wall thickening, distention, or inflammatory changes. Vascular/Lymphatic: No significant vascular findings are present. No enlarged abdominal or pelvic lymph nodes. Reproductive: Fibroid uterus.  No adnexal mass. Other: Trace free intraperitoneal fluid. Persistent free intraperitoneal gas within the upper abdomen with stable to slightly increased volume. Interval development of a new 3.4 x 1.8 x 1.9 cm gas and fluid collection just anterior to the gallbladder fundus (3:34) that appears to be continuous with the site of previous pre-pyloric gastric ulcer perforation (6:48-54). Interval placement of a left pelvic drain that terminates within a  known irregular fluid and gas organized collection that has decreased in size with its largest pocket of fluid measuring 8 x 5.1 cm open (from 11 x 5 cm). Musculoskeletal: Suggestion of a cutaneous tract between the new right upper abdomen gas and fluid collection and upper mid subcutaneous soft tissues of the anterior abdomen/incision (3:36, 7:71). Healing anterior abdominal incision with nonspecific soft tissue densities along the peritoneum (3:64, 73) likely related to recent surgery/closure. Mild subcutaneous soft tissue edema. No acute or significant osseous findings. Multilevel degenerative changes of the spine. IMPRESSION: 1. Interval development of a 3.4 x 1.8 x 1.9 cm right upper abdominal gas and fluid collection just anterior to the gallbladder fundus that appears to be continuous with the area of prior pre-pyloric gastric ulcer perforation (and therefore in keeping with a persistent enteric leak). This new subhepatic abscess also appears to be continuous with the upper abdominal subcutaneous soft tissues/incision consistent with a cutaneous fistula. 2. Interval decrease in size of a 3.4 x 2.1 x 1.8 cm subcapsular right hepatic fluid collection. 3. Interval placement of a left pelvic drain with interval decrease in size of a left pelvic gas and fluid collection with the largest pocket now measuring 8 x 5 cm (from 11 x 5 cm). 4. Interval decrease in bilateral trace pleural effusions with bibasilar atelectasis. 5. Other imaging findings of potential clinical significance: Cholelithiasis. Similar-appearing cystic pancreatic head and neck lesions measuring up to 2.2 cm with associated calcifications that are better evaluated on MR abdomen 04/22/2020. Stable 2.3 cm heterogeneous right renal lesion that likely represents a benign proteinaceous versus hemorrhagic cyst per MRI abdomen 04/22/2020. Electronically Signed: By: Iven Finn M.D. On: 05/06/2020 22:23      Assessment/Plan HTN Depression Acute  PE - heparin gtt  S/P exploratory laparotomy and Graham patch for perforated gastric ulcer by Dr. Arnoldo Morale 8/30 Pelvic abscess s/p IR drainage New RUQ collection around site of prior ulcer repair - POD#15 - concern for possible anastomotic leak although no contrast extrav noted on CT - plan to aspirate/drain collection in IR today, will send for cxs - if collection reaccumulates, that would be more suggestive of leak -  collection seems to be decompressing through midline incision some, hopefully with aspiration today this will stop - ok to have liquids after procedure today - mobilize as able  FEN: NPO for IR procedure, IVF VTE: heparin gtt ID: [email or web address]  Follow up: Dr. Ed Blalock, [facility] Surgery 05/07/2020, 11:27 AM Please see Amion for pager number during day hours 7:00am-4:30pm

## 2020-05-07 NOTE — Progress Notes (Addendum)
ANTICOAGULATION CONSULT NOTE - Initial Consult  Pharmacy Consult for Heparin Indication: Submassive acute pulmonary embolism/ saddle with multiple bilateral embolus  Allergies  Allergen Reactions  . Wheat Extract Swelling    Patient Measurements: Height: 5\' 4"  (162.6 cm) Weight: 102 kg (224 lb 13.9 oz) IBW/kg (Calculated) : 54.7 Heparin Dosing Weight: 78.4 kg  Vital Signs: Temp: 98.5 F (36.9 C) (09/14 0801) Temp Source: Oral (09/14 0801) BP: 129/82 (09/14 0801) Pulse Rate: 86 (09/14 0801)  Labs: Recent Labs    05/05/20 0332 05/07/20 0330  HGB 10.1* 11.0*  HCT 31.0* 32.6*  PLT 219 231  CREATININE 0.77 0.83    Estimated Creatinine Clearance: 80.6 mL/min (by C-G formula based on SCr of 0.83 mg/dL).   Medical History: Past Medical History:  Diagnosis Date  . Essential hypertension, benign 01/24/2015  . Foot ulcer ([place])   . Hypertension   . Memory loss   . Skin cancer   . Vision abnormalities     Assessment:  Anticoag: Heparin>xarelto for Submassive acute pulmonary embolism/ saddle with multiple bilateral embolus/ per CTA 9/8; dopplers negative for DVT on 9/8. Hgb 11 up. Plts WNL - s/p peptic ulcer perforation s/p repair 8/30 - - 9/14: Sharp/severe  abdominal pain overnight. Lower quad surgical site with yellow purulent drainage. Surgery recommended stop DOAC (last dose 9/13 at 1637) and resume abx in preparation for surgery.  Goal of Therapy:  APTT 66-102 Heparin level 0.3-0.7 units/ml Monitor platelets by anticoagulation protocol: Yes   Plan:  DOAC d/c'd Heparin 4000 unit IV bolus  Heparin infusion at 1250 units/hr Check heparin level (likely will be elevated from DOAC interaction) and aPTT 6-8 hrs after heparin starts. Daily HL and APTT until correlation and daily CBC   Emmitt Matthews S. Alford Highland, PharmD, BCPS Clinical Staff Pharmacist [email or web address] Alford Highland, [place] 05/07/2020,8:07 AM

## 2020-05-07 NOTE — Sedation Documentation (Signed)
Patient is resting comfortably. VSS °

## 2020-05-07 NOTE — Progress Notes (Addendum)
ANTICOAGULATION CONSULT NOTE - Follow-Up Consult  Pharmacy Consult for Heparin Indication:  Submassive acute pulmonary embolism/saddle with multiple bilateral emboli  Allergies  Allergen Reactions  . Wheat Extract Swelling    Patient Measurements: Height: 5\' 4"  (162.6 cm) Weight: 102 kg (224 lb 13.9 oz) IBW/kg (Calculated) : 54.7 Heparin Dosing Weight: 78.4 kg  Vital Signs: Temp: 98.1 F (36.7 C) (09/14 1753) Temp Source: Oral (09/14 1753) BP: 141/86 (09/14 1753) Pulse Rate: 88 (09/14 1753)  Labs: Recent Labs    05/05/20 0332 05/07/20 0330 05/07/20 1555 05/07/20 1843  HGB 10.1* 11.0*  --   --   HCT 31.0* 32.6*  --   --   PLT 219 231  --   --   APTT  --   --  >200* 168*  HEPARINUNFRC  --   --  >2.20* >2.20*  CREATININE 0.77 0.83  --   --     Estimated Creatinine Clearance: 80.6 mL/min (by C-G formula based on SCr of 0.83 mg/dL).   Medical History: Past Medical History:  Diagnosis Date  . Essential hypertension, benign 01/24/2015  . Foot ulcer ([place])   . Hypertension   . Memory loss   . Skin cancer   . Vision abnormalities     Assessment:  Heparin>Xarelto for submassive acute pulmonary embolism/ saddle with multiple bilateral emboli/ per CTA 9/8; dopplers negative for DVT on 9/8. Hgb 11 up. Plts WNL - s/p peptic ulcer perforation, s/p repair 8/30 - - 9/14: sharp/severe abdominal pain overnight; lower quad surgical site with yellow purulent drainage; surgery recommended stopping Xarelto (last dose 9/13 at 1637) and resuming heparin in preparation for surgery. Due to recent Xarelto exposure, will monitor anticoagulation using aPTT until aPTT and heparin levels correlate.  Pt is S/P CT aspiration of small sub-hepatic fluid collection this afternoon; per IR note, can restart heparin after procedure (~5:20 PM).  Per RN, heparin infusion was not stopped for procedure. aPTT and heparin level were sent prior to procedure at 1555 PM (~7 hrs after heparin 4000 units IV  bolus, followed by initiation of heparin infusion at 1250 units/hr) and resulted at 1710 PM (after pt in procedure) as follows: aPTT >200 sec, heparin level >2.20 units/ml. RN was instructed to hold heparin infusion while these labs were repeated. Repeat aPTT and heparin level: 168 sec, >2.20 units/ml. Spoke with phlebotomist who drew second set of levels and he confirmed that labs were drawn from opposite arm from that in which the heparin was infusing. At this point, heparin infusion has been held ~1.5 hrs while waiting for repeat labs. Per RN, no bleeding issues observed.  Goal of Therapy:  aPTT 66-102 Heparin level 0.3-0.7 units/ml Monitor platelets by anticoagulation protocol: Yes   Plan:  Restart heparin infusion at 1000 units/hr Check 6-hr aPTT, heparin level Monitor daily aPTT, heparin level, CBC Monitor for signs/symptoms of bleeding  Gillermina Hu, PharmD, BCPS, [facility] Clinical Pharmacist 05/07/2020,7:34 PM

## 2020-05-07 NOTE — Progress Notes (Signed)
Initial Nutrition Assessment  DOCUMENTATION CODES:   Not applicable  INTERVENTION:   Obtain recent weight (last one documented on 9/2)  Once diet advanced:   Ensure Enlive po BID, each supplement provides 350 kcal and 20 grams of protein  MVI daily   NUTRITION DIAGNOSIS:   Increased nutrient needs related to post-op healing as evidenced by estimated needs.  GOAL:   Patient will meet greater than or equal to 90% of their needs  MONITOR:   PO intake, Supplement acceptance, Weight trends, Labs, I & O's  REASON FOR ASSESSMENT:   Consult Assessment of nutrition requirement/status  ASSESSMENT:   Patient with PMH significant for HTN and memory loss. Presented to [facility] with acute onset pneumoperitoneum likely from peptic ulcer perforation. Transferred to [facility] for submassive PE and peritonitis.   8/28- admit [facility] 8/30- MRCP shows new free fluid in abdomen and pneumoperitoneum, ex lap, gastrorrhaphy  9/8- tx [facility], pelvic abscess drain placement  Found to have sub hepatic fluid collection. Plan aspiration in IR today.   Pt endorses a decreased appetite over the last three weeks due to worsening fatigue. States she tried to eat vegetables and fruit during this time period but could only tolerate bites at a time. Appetite this admission has progressed slowly. Last 8 meal completions charted as 10-75%. Discussed the importance of protein intake for preservation of lean body mass. Pt willing to have Ensure once diet advanced.   Pt reports a UBW of 200 lb and denies recent wt loss. Records indicate pt has maintained her weight over the last year.   Drips: D5 in LR @ 75 ml/hr  Labs: Na 134 (L) CBG 116-138  NUTRITION - FOCUSED PHYSICAL EXAM:    Most Recent Value  Orbital Region No depletion  Upper Arm Region No depletion  Thoracic and Lumbar Region Unable to assess  Buccal Region No depletion  Temple Region No depletion  Clavicle Bone Region No depletion  Clavicle and Acromion  Bone Region No depletion  Scapular Bone Region Unable to assess  Dorsal Hand No depletion  Patellar Region Unable to assess  Anterior Thigh Region Unable to assess  Posterior Calf Region Unable to assess  Edema (RD Assessment) Unable to assess  Hair Reviewed  Eyes Reviewed  Mouth Reviewed  Skin Reviewed  Nails Reviewed     Diet Order:   Diet Order            Diet NPO time specified  Diet effective now           Diet - low sodium heart healthy                 EDUCATION NEEDS:   Not appropriate for education at this time  Skin:  Skin Assessment: Skin Integrity Issues: Skin Integrity Issues:: Incisions Incisions: abdomen  Last BM:  9/11  Height:   Ht Readings from Last 1 Encounters:  04/21/20 5\' 4"  (7.510 m)    Weight:   Wt Readings from Last 1 Encounters:  04/25/20 102 kg    BMI:  Body mass index is 38.6 kg/m.  Estimated Nutritional Needs:   Kcal:  1700-1900 kcal  Protein:  85-100 grams  Fluid:  >/= 1.7 L/day   Mariana Single RD, LDN Clinical Nutrition Pager listed in [place]

## 2020-05-07 NOTE — Progress Notes (Signed)
PT Cancellation Note  Patient Details Name: Maria Murphy MRN: [id number] DOB: [birth year]   Cancelled Treatment:    Reason Eval/Treat Not Completed: Other (comment). Pt was up in chair for 2 hours earlier and is now exhausted and waiting to go to IR. Will continue to follow.    Shary Decamp [facility] 05/07/2020, 3:35 PM [place] Pager [phone] Office [phone]

## 2020-05-08 DIAGNOSIS — R1010 Upper abdominal pain, unspecified: Secondary | ICD-10-CM

## 2020-05-08 DIAGNOSIS — L0291 Cutaneous abscess, unspecified: Secondary | ICD-10-CM

## 2020-05-08 DIAGNOSIS — I2692 Saddle embolus of pulmonary artery without acute cor pulmonale: Secondary | ICD-10-CM

## 2020-05-08 LAB — BASIC METABOLIC PANEL
Anion gap: 12 (ref 5–15)
BUN: 6 mg/dL — ABNORMAL LOW (ref 8–23)
CO2: 24 mmol/L (ref 22–32)
Calcium: 8.5 mg/dL — ABNORMAL LOW (ref 8.9–10.3)
Chloride: 100 mmol/L (ref 98–111)
Creatinine, Ser: 0.79 mg/dL (ref 0.44–1.00)
GFR calc Af Amer: >60 mL/min (ref >60)
GFR calc non Af Amer: >60 mL/min (ref >60)
Glucose, Bld: 125 mg/dL — ABNORMAL HIGH (ref 70–99)
Potassium: 3.6 mmol/L (ref 3.5–5.1)
Sodium: 136 mmol/L (ref 135–145)

## 2020-05-08 LAB — CBC
HCT: 32.4 % — ABNORMAL LOW (ref 36.0–46.0)
Hemoglobin: 10.8 g/dL — ABNORMAL LOW (ref 12.0–15.0)
MCH: 29.2 pg (ref 26.0–34.0)
MCHC: 33.3 g/dL (ref 30.0–36.0)
MCV: 87.6 fL (ref 80.0–100.0)
Platelets: 231 10*3/uL (ref 150–400)
RBC: 3.7 MIL/uL — ABNORMAL LOW (ref 3.87–5.11)
RDW: 13.5 % (ref 11.5–15.5)
WBC: 7.4 10*3/uL (ref 4.0–10.5)
nRBC: 0 % (ref 0.0–0.2)

## 2020-05-08 LAB — APTT
aPTT: 136 seconds — ABNORMAL HIGH (ref 24–36)
aPTT: 82 seconds — ABNORMAL HIGH (ref 24–36)
aPTT: 41 seconds — ABNORMAL HIGH (ref 24–36)

## 2020-05-08 LAB — CBC WITH DIFFERENTIAL/PLATELET
Abs Immature Granulocytes: 0.02 10*3/uL (ref 0.00–0.07)
Basophils Absolute: 0 10*3/uL (ref 0.0–0.1)
Basophils Relative: 0 %
Eosinophils Absolute: 0.2 10*3/uL (ref 0.0–0.5)
Eosinophils Relative: 3 %
HCT: 31.9 % — ABNORMAL LOW (ref 36.0–46.0)
Hemoglobin: 10.5 g/dL — ABNORMAL LOW (ref 12.0–15.0)
Immature Granulocytes: 0 %
Lymphocytes Relative: 13 %
Lymphs Abs: 1 10*3/uL (ref 0.7–4.0)
MCH: 28.7 pg (ref 26.0–34.0)
MCHC: 32.9 g/dL (ref 30.0–36.0)
MCV: 87.2 fL (ref 80.0–100.0)
Monocytes Absolute: 0.8 10*3/uL (ref 0.1–1.0)
Monocytes Relative: 12 %
Neutro Abs: 5.3 10*3/uL (ref 1.7–7.7)
Neutrophils Relative %: 72 %
Platelets: 213 10*3/uL (ref 150–400)
RBC: 3.66 MIL/uL — ABNORMAL LOW (ref 3.87–5.11)
RDW: 13.5 % (ref 11.5–15.5)
WBC: 7.3 10*3/uL (ref 4.0–10.5)
nRBC: 0 % (ref 0.0–0.2)

## 2020-05-08 LAB — HEPARIN LEVEL (UNFRACTIONATED): Heparin Unfractionated: >2.2 IU/mL — ABNORMAL HIGH (ref 0.30–0.70)

## 2020-05-08 MED ORDER — DOCUSATE SODIUM 100 MG PO CAPS
100.0000 mg | ORAL_CAPSULE | Freq: Two times a day (BID) | ORAL | Status: DC
  Administered 2020-05-08: 100 mg via ORAL
  Filled 2020-05-08 (×2): qty 1

## 2020-05-08 MED ORDER — BISACODYL 10 MG RE SUPP: 10.0000 mg | Freq: Every day | RECTAL | Status: DC

## 2020-05-08 MED ORDER — OXYCODONE HCL 5 MG PO TABS
5.0000 mg | ORAL_TABLET | ORAL | Status: DC | PRN
  Administered 2020-05-08 – 2020-05-17 (×14): 5 mg via ORAL
  Filled 2020-05-08 (×14): qty 1

## 2020-05-08 MED ORDER — MORPHINE SULFATE (PF) 2 MG/ML IV SOLN
1.0000 mg | INTRAVENOUS | Status: DC | PRN
  Administered 2020-05-08 (×3): 2 mg via INTRAVENOUS
  Filled 2020-05-08 (×3): qty 1

## 2020-05-08 NOTE — Progress Notes (Signed)
ANTICOAGULATION CONSULT NOTE - Follow Up Consult  Pharmacy Consult for Heparin Indication: pulmonary embolus  Allergies  Allergen Reactions  . Wheat Extract Swelling    Patient Measurements: Height: 5\' 4"  (162.6 cm) Weight: 102 kg (224 lb 13.9 oz) IBW/kg (Calculated) : 54.7 Heparin Dosing Weight:  78.4 kg  Vital Signs: Temp: 98.6 F (37 C) (09/15 0800) Temp Source: Oral (09/15 0800) BP: 132/70 (09/15 0800) Pulse Rate: 80 (09/15 1100)  Labs: Recent Labs    05/07/20 0330 05/07/20 1555 05/07/20 1555 05/07/20 1843 05/08/20 0243 05/08/20 1129  HGB 11.0*  --   --   --  10.5*  --   HCT 32.6*  --   --   --  31.9*  --   PLT 231  --   --   --  213  --   APTT  --  >200*   < > 168* 136* 82*  HEPARINUNFRC  --  >2.20*  --  >2.20* >2.20*  --   CREATININE 0.83  --   --   --  0.79  --    < > = values in this interval not displayed.    Estimated Creatinine Clearance: 83.6 mL/min (by C-G formula based on SCr of 0.79 mg/dL).   Assessment:  Anticoag: Heparin>Xarelto for Submassive acute pulmonary embolism/ saddle with multiple bilateral embolus/ per CTA 9/8; dopplers negative for DVT on 9/8. Hgb 11 up. Plts WNL - s/p peptic ulcer perforation - 9/14: Surgery recommended stop DOAC (last dose 9/13 at 1637) for procedure.  - 9/15: aPTT 168, 136, Hgb 10.5 relatively stable. Plts WNL. Abdom incision draining serosanguinous fluid. APTT 82 in goal.  Goal of Therapy:  aPTT 66-102 seconds Monitor platelets by anticoagulation protocol: Yes   Plan:  Con't IV heparin at 900 units/hr Check confirmatory aPTT in 6 hr. Daily HL, aPTT, INR F/u when safe to resume DOAC Xarelto d/c'd 9/13 (education done already)   [employer]. Alford Highland, PharmD, BCPS Clinical Staff Pharmacist [email or web address] Alford Highland, [place] Yeatts [place] 05/08/2020,12:25 PM

## 2020-05-08 NOTE — Progress Notes (Signed)
Referring Physician(s): Lovick,A  Supervising Physician: Suttle,D  Patient Status:  [facility] - In-pt  Chief Complaint: Left pelvic pain/abscess, nausea   Subjective: Pt c/o soreness left pelvic region at drain site, occ nausea   Allergies: Wheat extract  Medications: Prior to Admission medications   Medication Sig Start Date End Date Taking? Authorizing Provider  acetaminophen (TYLENOL) 500 MG tablet Take 1 tablet (500 mg total) by mouth every 6 (six) hours as needed. 08/10/18  Yes Idol, Almyra Free, PA-C  citalopram (CELEXA) 20 MG tablet Take 1 tablet (20 mg total) by mouth daily. 08/16/19  Yes Jaynee Eagles, PA-C  donepezil (ARICEPT) 10 MG tablet Take 5-10 mg by mouth in the morning and at bedtime.  04/02/20  Yes [provider]  gabapentin (NEURONTIN) 300 MG capsule Take 300 mg by mouth 2 (two) times daily.  08/23/19  Yes [provider]  lisinopril-hydrochlorothiazide (PRINZIDE,ZESTORETIC) 10-12.5 MG tablet Take 1 tablet by mouth daily. 08/07/18  Yes [provider]  traZODone (DESYREL) 100 MG tablet Take 200 mg by mouth at bedtime.  08/02/19  Yes [provider]  Ginger, Zingiber officinalis, (GINGER PO) Take 1 tablet by mouth daily.     [provider]  LORazepam (ATIVAN) 1 MG tablet Take 1 tablet (1 mg total) by mouth every 6 (six) hours as needed for anxiety. 05/06/20   Arrien, Jimmy Picket, MD  methocarbamol (ROBAXIN) 500 MG tablet Take 1 tablet (500 mg total) by mouth every 8 (eight) hours as needed for muscle spasms. Patient not taking: Reported on 04/22/2020 03/26/20   Ezequiel Essex, MD  naproxen (NAPROSYN) 500 MG tablet Take 1 tablet (500 mg total) by mouth 2 (two) times daily as needed. Patient not taking: Reported on 04/22/2020 03/26/20   Ezequiel Essex, MD  oxyCODONE (OXY IR/ROXICODONE) 5 MG immediate release tablet Take 1 tablet (5 mg total) by mouth every 6 (six) hours as needed for moderate pain or severe pain. 05/06/20   Arrien,  Jimmy Picket, MD  pantoprazole (PROTONIX) 40 MG tablet Take 1 tablet (40 mg total) by mouth 2 (two) times daily. 05/06/20 06/05/20  Arrien, Jimmy Picket, MD  Rivaroxaban (XARELTO) 15 MG TABS tablet Take 1 tablet (15 mg total) by mouth 2 (two) times daily with a meal for 17 days. Complete on 05/23/20 05/06/20 05/23/20  Arrien, Jimmy Picket, MD  rivaroxaban (XARELTO) 20 MG TABS tablet Take 1 tablet (20 mg total) by mouth daily with supper. Start 05/24/20 05/24/20   Arrien, Jimmy Picket, MD     Vital Signs: BP 132/70 (BP Location: Left Arm)   Pulse 80   Temp 98.6 F (37 C) (Oral)   Resp 18   Ht 5\' 4"  (1.626 m)   Wt 224 lb 13.9 oz (102 kg)   SpO2 94%   BMI 38.60 kg/m   Physical Exam awake; LLQ drain intact, some bloody drainage noted at insertion site, sl tender to palpation, OP 20 cc bloody fluid; can push saline via drain but no sig fluid aspirated back  Imaging: CT ABDOMEN PELVIS W CONTRAST  Addendum Date: 05/06/2020   ADDENDUM REPORT: 05/06/2020 22:39 ADDENDUM: These results were called by telephone at the time of interpretation on 05/06/2020 at 10:38 pm to provider Dr. Cyd Silence, who verbally acknowledged these results. Electronically Signed   By: Iven Finn M.D.   On: 05/06/2020 22:39   Result Date: 05/06/2020 CLINICAL DATA:  Intra-abdominal abscess follow-up in a patient with perforated pre-pyloric gastric ulcer status post repair EXAM: CT ABDOMEN AND  PELVIS WITH CONTRAST TECHNIQUE: Multidetector CT imaging of the abdomen and pelvis was performed using the standard protocol following bolus administration of intravenous contrast. CONTRAST:  133mL OMNIPAQUE IOHEXOL 300 MG/ML  SOLN COMPARISON:  CT abdomen pelvis 04/30/2020, CT abdomen pelvis 04/20/2020, MR abdomen 04/22/2020 FINDINGS: Lower chest: Interval decrease in residual trace bilateral pleural effusions. Associated atelectasis of the bilateral, right greater than left lower lobes. Hepatobiliary: Interval decrease in size  of a 3.4 x 2.1 x 1.8 cm (from 5.1 x 2.1 x 2cm) subcapsular fluid collection along the right inferior hepatic lobe (3:32). Subcentimeter hypodensity within the right hepatic dome is still too small to characterize. Cholelithiasis again noted. No biliary ductal dilatation. Pancreas: Redemonstration several fluid density lesions with associated calcifications open (3:40) within the head of the pancreas measuring up to 2.2 cm (3:40). The lesions are better evaluated on MRI abdomen 04/22/2020. Otherwise no main pancreatic duct dilatation distally. No abnormal pancreatic contour. Spleen: Normal in size without focal abnormality. Adrenals/Urinary Tract: No adrenal nodule bilaterally. Redemonstration of bilateral peripelvic cysts. Bilateral subcentimeter hypodensities are too small to characterize. Similar-appearing 2.9 cm fluid density lesion that likely represents a simple renal cyst within the left kidney. Redemonstration of a 2.3 cm heterogeneous exophytic lesion arising from the right kidney (3:34) that likely represents a benign proteinaceous versus hemorrhagic cyst per MR abdomen 04/22/20. Bilateral kidneys enhance symmetrically. No hydronephrosis. No hydroureter. The urinary bladder is unremarkable. Stomach/Bowel: PO contrast is noted to reach the rectum. No PO contrast extravasation. Stomach is within normal limits. Appendix appears normal. No evidence of bowel wall thickening, distention, or inflammatory changes. Vascular/Lymphatic: No significant vascular findings are present. No enlarged abdominal or pelvic lymph nodes. Reproductive: Fibroid uterus.  No adnexal mass. Other: Trace free intraperitoneal fluid. Persistent free intraperitoneal gas within the upper abdomen with stable to slightly increased volume. Interval development of a new 3.4 x 1.8 x 1.9 cm gas and fluid collection just anterior to the gallbladder fundus (3:34) that appears to be continuous with the site of previous pre-pyloric gastric ulcer  perforation (6:48-54). Interval placement of a left pelvic drain that terminates within a known irregular fluid and gas organized collection that has decreased in size with its largest pocket of fluid measuring 8 x 5.1 cm open (from 11 x 5 cm). Musculoskeletal: Suggestion of a cutaneous tract between the new right upper abdomen gas and fluid collection and upper mid subcutaneous soft tissues of the anterior abdomen/incision (3:36, 7:71). Healing anterior abdominal incision with nonspecific soft tissue densities along the peritoneum (3:64, 73) likely related to recent surgery/closure. Mild subcutaneous soft tissue edema. No acute or significant osseous findings. Multilevel degenerative changes of the spine. IMPRESSION: 1. Interval development of a 3.4 x 1.8 x 1.9 cm right upper abdominal gas and fluid collection just anterior to the gallbladder fundus that appears to be continuous with the area of prior pre-pyloric gastric ulcer perforation (and therefore in keeping with a persistent enteric leak). This new subhepatic abscess also appears to be continuous with the upper abdominal subcutaneous soft tissues/incision consistent with a cutaneous fistula. 2. Interval decrease in size of a 3.4 x 2.1 x 1.8 cm subcapsular right hepatic fluid collection. 3. Interval placement of a left pelvic drain with interval decrease in size of a left pelvic gas and fluid collection with the largest pocket now measuring 8 x 5 cm (from 11 x 5 cm). 4. Interval decrease in bilateral trace pleural effusions with bibasilar atelectasis. 5. Other imaging findings of potential clinical significance: Cholelithiasis.  Similar-appearing cystic pancreatic head and neck lesions measuring up to 2.2 cm with associated calcifications that are better evaluated on MR abdomen 04/22/2020. Stable 2.3 cm heterogeneous right renal lesion that likely represents a benign proteinaceous versus hemorrhagic cyst per MRI abdomen 04/22/2020. Electronically Signed: By:  Iven Finn M.D. On: 05/06/2020 22:23   CT ASPIRATION  Result Date: 05/08/2020 INDICATION: 64 year old female with a history of Graham patch for perforated gastric ulcer, with new fluid collection in the sub a [place] space referred for aspiration EXAM: CT-GUIDED ASPIRATE OF UPPER ABDOMINAL FLUID COLLECTION MEDICATIONS: The patient is currently admitted to the hospital and receiving intravenous antibiotics. The antibiotics were administered within an appropriate time frame prior to the initiation of the procedure. ANESTHESIA/SEDATION: Fentanyl 1.0 mcg IV; Versed 50 mg IV Moderate Sedation Time:  10 minutes The patient was continuously monitored during the procedure by the interventional radiology nurse under my direct supervision. COMPLICATIONS: None PROCEDURE: Informed written consent was obtained from the patient after a thorough discussion of the procedural risks, benefits and alternatives. All questions were addressed. Maximal Sterile Barrier Technique was utilized including caps, mask, sterile gowns, sterile gloves, sterile drape, hand hygiene and skin antiseptic. A timeout was performed prior to the initiation of the procedure. Patient was positioned supine position on CT gantry table. Scout CT was acquired for planning purposes. Patient is prepped and draped in the usual sterile fashion. 1% lidocaine was used for local anesthesia. Using CT guidance, Yueh needle was advanced into the fluid collection of the subhepatic space. Aspirate of the gas and fluid collection was accomplished with approximately 3-4 cc of bloody fluid with some complexity. Culture was sent. Yueh catheter was removed and a sterile bandage was placed. Patient tolerated the procedure well and remained hemodynamically stable throughout. No complications were encountered and no significant blood loss. IMPRESSION: Status post CT-guided aspirate of subhepatic fluid collection, with a culture sent with some complex bloody fluid. Signed,  Dulcy Fanny. Dellia Nims, RPVI [facility] Electronically Signed   By: Corrie Mckusick D.O.   On: 05/08/2020 07:08    Labs:  CBC: Recent Labs    05/04/20 0146 05/05/20 0332 05/07/20 0330 05/08/20 0243  WBC 12.0* 9.0 7.1 7.3  HGB 10.1* 10.1* 11.0* 10.5*  HCT 30.7* 31.0* 32.6* 31.9*  PLT 231 219 231 213    COAGS: Recent Labs    05/01/20 0818 05/07/20 1555 05/07/20 1843 05/08/20 0243  INR 1.2  --   --   --   APTT  --  >200* 168* 136*    BMP: Recent Labs    05/04/20 0146 05/05/20 0332 05/07/20 0330 05/08/20 0243  NA 135 135 134* 136  K 3.2* 4.0 4.2 3.6  CL 99 102 99 100  CO2 25 25 26 24   GLUCOSE 123* 138* 116* 125*  BUN 7* 7* 7* 6*  CALCIUM 8.0* 8.3* 8.6* 8.5*  CREATININE 0.66 0.77 0.83 0.79  GFRNONAA >60 >60 >60 >60  GFRAA >60 >60 >60 >60    LIVER FUNCTION TESTS: Recent Labs    04/23/20 0451 04/24/20 0442 05/02/20 0130 05/07/20 0330  BILITOT 0.9 0.8 0.7 0.6  AST 59* 25 25 40  ALT 22 14 15 22   ALKPHOS 26* 23* 64 54  PROT 6.4* 5.3* 5.4* 6.7  ALBUMIN 3.2* 2.5* 2.0* 2.4*    Assessment and Plan: Pt with hx Graham patch for perf gastric ulcer, postop pelvic fluid/subhepatic fluid collections; s/p LLQ drain 9/8 followed by aspiration of small  component of subhepatic fluid on 9/14 (3-4 cc total); also on hep drip for PE; afebrile; WBC nl; hgb stable; creat nl; left pelvic fl cx- few candida/enterococcus; subhepatic fl cx pend; minimal fluid return from LLQ drain now/small amt bloody fluid- will review latest CT with IR MD   Electronically Signed: D. Rowe Robert, PA-C 05/08/2020, 10:56 AM   I spent a total of 15 minutes at the the patient's bedside AND on the patient's hospital floor or unit, greater than 50% of which was counseling/coordinating care for left pelvic fluid collection    Patient ID: Valborg Friar, female   DOB: August 27, 1955, 64 y.o.   MRN: [id number]

## 2020-05-08 NOTE — Progress Notes (Signed)
[facility] Progress Note  16 Days Post-Op  Subjective: Patient reports abdominal discomfort this AM. She has not had a BM or passed gas in several days. Got up to chair with assistance yesterday.   Per RN, LLQ drain also seems to be clogged frequently even with flushing and stripping tubing.   Objective: Vital signs in last 24 hours: Temp:  [98.1 F (36.7 C)-99 F (37.2 C)] 98.5 F (36.9 C) (09/15 0357) Pulse Rate:  [74-95] 83 (09/15 0357) Resp:  [16-22] 17 (09/15 0357) BP: (124-154)/(75-87) 124/75 (09/15 0357) SpO2:  [93 %-96 %] 93 % (09/15 0357) Last BM Date: 05/03/20  Intake/Output from previous day: 09/14 0701 - 09/15 0700 In: 2206.2 [I.V.:1846.2; IV Piggyback:350] Out: 2220 [Urine:2200; Drains:20] Intake/Output this shift: No intake/output data recorded.  PE: General: pleasant, WD, obese female who is sitting up in cha HEENT: Sclera are noninjected.  PERRL.  Ears and nose without any masses or lesions.  Mouth is pink and moist Heart: regular, rate, and rhythm.  Normal s1,s2. No obvious murmurs, gallops, or rubs noted.  Palpable radial and pedal pulses bilaterally Lungs: CTAB, no wheezes, rhonchi, or rales noted.  Respiratory effort nonlabored Abd: soft, NT, ND, +BS, midline incision with small opening superiorly which is 4 cm in depth and does not seem to trach inferiorly or superiorly along incision, purulent drainage, LLQ drain with scant bloody material    Lab Results:  Recent Labs    05/07/20 0330 05/08/20 0243  WBC 7.1 7.3  HGB 11.0* 10.5*  HCT 32.6* 31.9*  PLT 231 213   BMET Recent Labs    05/07/20 0330 05/08/20 0243  NA 134* 136  K 4.2 3.6  CL 99 100  CO2 26 24  GLUCOSE 116* 125*  BUN 7* 6*  CREATININE 0.83 0.79  CALCIUM 8.6* 8.5*   PT/INR No results for input(s): LABPROT, INR in the last 72 hours. CMP     Component Value Date/Time   NA 136 05/08/2020 0243   K 3.6 05/08/2020 0243   CL 100 05/08/2020 0243   CO2 24  05/08/2020 0243   GLUCOSE 125 (H) 05/08/2020 0243   BUN 6 (L) 05/08/2020 0243   CREATININE 0.79 05/08/2020 0243   CALCIUM 8.5 (L) 05/08/2020 0243   PROT 6.7 05/07/2020 0330   ALBUMIN 2.4 (L) 05/07/2020 0330   AST 40 05/07/2020 0330   ALT 22 05/07/2020 0330   ALKPHOS 54 05/07/2020 0330   BILITOT 0.6 05/07/2020 0330   GFRNONAA >60 05/08/2020 0243   GFRAA >60 05/08/2020 0243   Lipase     Component Value Date/Time   LIPASE 33 04/21/2020 1835       Studies/Results: CT ABDOMEN PELVIS W CONTRAST  Addendum Date: 05/06/2020   ADDENDUM REPORT: 05/06/2020 22:39 ADDENDUM: These results were called by telephone at the time of interpretation on 05/06/2020 at 10:38 pm to provider Dr. Cyd Silence, who verbally acknowledged these results. Electronically Signed   By: Iven Finn M.D.   On: 05/06/2020 22:39   Result Date: 05/06/2020 CLINICAL DATA:  Intra-abdominal abscess follow-up in a patient with perforated pre-pyloric gastric ulcer status post repair EXAM: CT ABDOMEN AND PELVIS WITH CONTRAST TECHNIQUE: Multidetector CT imaging of the abdomen and pelvis was performed using the standard protocol following bolus administration of intravenous contrast. CONTRAST:  156mL OMNIPAQUE IOHEXOL 300 MG/ML  SOLN COMPARISON:  CT abdomen pelvis 04/30/2020, CT abdomen pelvis 04/20/2020, MR abdomen 04/22/2020 FINDINGS: Lower chest: Interval decrease in residual trace bilateral pleural effusions. Associated atelectasis  of the bilateral, right greater than left lower lobes. Hepatobiliary: Interval decrease in size of a 3.4 x 2.1 x 1.8 cm (from 5.1 x 2.1 x 2cm) subcapsular fluid collection along the right inferior hepatic lobe (3:32). Subcentimeter hypodensity within the right hepatic dome is still too small to characterize. Cholelithiasis again noted. No biliary ductal dilatation. Pancreas: Redemonstration several fluid density lesions with associated calcifications open (3:40) within the head of the pancreas measuring up  to 2.2 cm (3:40). The lesions are better evaluated on MRI abdomen 04/22/2020. Otherwise no main pancreatic duct dilatation distally. No abnormal pancreatic contour. Spleen: Normal in size without focal abnormality. Adrenals/Urinary Tract: No adrenal nodule bilaterally. Redemonstration of bilateral peripelvic cysts. Bilateral subcentimeter hypodensities are too small to characterize. Similar-appearing 2.9 cm fluid density lesion that likely represents a simple renal cyst within the left kidney. Redemonstration of a 2.3 cm heterogeneous exophytic lesion arising from the right kidney (3:34) that likely represents a benign proteinaceous versus hemorrhagic cyst per MR abdomen 04/22/20. Bilateral kidneys enhance symmetrically. No hydronephrosis. No hydroureter. The urinary bladder is unremarkable. Stomach/Bowel: PO contrast is noted to reach the rectum. No PO contrast extravasation. Stomach is within normal limits. Appendix appears normal. No evidence of bowel wall thickening, distention, or inflammatory changes. Vascular/Lymphatic: No significant vascular findings are present. No enlarged abdominal or pelvic lymph nodes. Reproductive: Fibroid uterus.  No adnexal mass. Other: Trace free intraperitoneal fluid. Persistent free intraperitoneal gas within the upper abdomen with stable to slightly increased volume. Interval development of a new 3.4 x 1.8 x 1.9 cm gas and fluid collection just anterior to the gallbladder fundus (3:34) that appears to be continuous with the site of previous pre-pyloric gastric ulcer perforation (6:48-54). Interval placement of a left pelvic drain that terminates within a known irregular fluid and gas organized collection that has decreased in size with its largest pocket of fluid measuring 8 x 5.1 cm open (from 11 x 5 cm). Musculoskeletal: Suggestion of a cutaneous tract between the new right upper abdomen gas and fluid collection and upper mid subcutaneous soft tissues of the anterior  abdomen/incision (3:36, 7:71). Healing anterior abdominal incision with nonspecific soft tissue densities along the peritoneum (3:64, 73) likely related to recent surgery/closure. Mild subcutaneous soft tissue edema. No acute or significant osseous findings. Multilevel degenerative changes of the spine. IMPRESSION: 1. Interval development of a 3.4 x 1.8 x 1.9 cm right upper abdominal gas and fluid collection just anterior to the gallbladder fundus that appears to be continuous with the area of prior pre-pyloric gastric ulcer perforation (and therefore in keeping with a persistent enteric leak). This new subhepatic abscess also appears to be continuous with the upper abdominal subcutaneous soft tissues/incision consistent with a cutaneous fistula. 2. Interval decrease in size of a 3.4 x 2.1 x 1.8 cm subcapsular right hepatic fluid collection. 3. Interval placement of a left pelvic drain with interval decrease in size of a left pelvic gas and fluid collection with the largest pocket now measuring 8 x 5 cm (from 11 x 5 cm). 4. Interval decrease in bilateral trace pleural effusions with bibasilar atelectasis. 5. Other imaging findings of potential clinical significance: Cholelithiasis. Similar-appearing cystic pancreatic head and neck lesions measuring up to 2.2 cm with associated calcifications that are better evaluated on MR abdomen 04/22/2020. Stable 2.3 cm heterogeneous right renal lesion that likely represents a benign proteinaceous versus hemorrhagic cyst per MRI abdomen 04/22/2020. Electronically Signed: By: Iven Finn M.D. On: 05/06/2020 22:23   CT ASPIRATION  Result  Date: 05/08/2020 INDICATION: 64 year old female with a history of Graham patch for perforated gastric ulcer, with new fluid collection in the sub a [place]add[place] space referred for aspiration EXAM: CT-GUIDED ASPIRATE OF UPPER ABDOMINAL FLUID COLLECTION MEDICATIONS: The patient is currently admitted to the hospital and receiving intravenous  antibiotics. The antibiotics were administered within an appropriate time frame prior to the initiation of the procedure. ANESTHESIA/SEDATION: Fentanyl 1.0 mcg IV; Versed 50 mg IV Moderate Sedation Time:  10 minutes The patient was continuously monitored during the procedure by the interventional radiology nurse under my direct supervision. COMPLICATIONS: None PROCEDURE: Informed written consent was obtained from the patient after a thorough discussion of the procedural risks, benefits and alternatives. All questions were addressed. Maximal Sterile Barrier Technique was utilized including caps, mask, sterile gowns, sterile gloves, sterile drape, hand hygiene and skin antiseptic. A timeout was performed prior to the initiation of the procedure. Patient was positioned supine position on CT gantry table. Scout CT was acquired for planning purposes. Patient is prepped and draped in the usual sterile fashion. 1% lidocaine was used for local anesthesia. Using CT guidance, Yueh needle was advanced into the fluid collection of the subhepatic space. Aspirate of the gas and fluid collection was accomplished with approximately 3-4 cc of bloody fluid with some complexity. Culture was sent. Yueh catheter was removed and a sterile bandage was placed. Patient tolerated the procedure well and remained hemodynamically stable throughout. No complications were encountered and no significant blood loss. IMPRESSION: Status post CT-guided aspirate of subhepatic fluid collection, with a culture sent with some complex bloody fluid. Signed, Dulcy Fanny. Dellia Nims, RPVI [facility] Radiology Electronically Signed   By: Corrie Mckusick D.O.   On: 05/08/2020 07:08    Anti-infectives: Anti-infectives (From admission, onward)   Start     Dose/Rate Route Frequency Ordered Stop   05/07/20 1000  fluconazole (DIFLUCAN) IVPB 400 mg        400 mg 100 mL/hr over 120 Minutes Intravenous Every 24 hours  05/06/20 2353 05/14/20 0959   05/07/20 0000  piperacillin-tazobactam (ZOSYN) IVPB 3.375 g  Status:  Discontinued        3.375 g 100 mL/hr over 30 Minutes Intravenous Every 8 hours 05/06/20 2353 05/06/20 2355   05/07/20 0000  piperacillin-tazobactam (ZOSYN) IVPB 3.375 g        3.375 g 12.5 mL/hr over 240 Minutes Intravenous Every 8 hours 05/06/20 2356 05/13/20 2359   05/06/20 1600  piperacillin-tazobactam (ZOSYN) IVPB 3.375 g        3.375 g 100 mL/hr over 30 Minutes Intravenous  Once 05/06/20 0947 05/06/20 1708   04/26/20 1000  fluconazole (DIFLUCAN) IVPB 400 mg        400 mg 100 mL/hr over 120 Minutes Intravenous Every 24 hours 04/26/20 0907 05/06/20 2359   04/22/20 0100  piperacillin-tazobactam (ZOSYN) IVPB 3.375 g  Status:  Discontinued        3.375 g 12.5 mL/hr over 240 Minutes Intravenous Every 8 hours 04/22/20 0002 05/06/20 0946   04/22/20 0000  piperacillin-tazobactam (ZOSYN) IVPB 3.375 g  Status:  Discontinued        3.375 g 100 mL/hr over 30 Minutes Intravenous Every 8 hours 04/21/20 2349 04/22/20 0002       Assessment/Plan HTN Depression Acute PE - heparin gtt  S/P exploratory laparotomy and Graham patch for perforated gastric ulcer by Dr. Arnoldo Morale 8/30 Pelvic abscess s/p IR drainage New RUQ collection around site of prior ulcer repair - POD#16 -  concern for possible anastomotic leak although no contrast extrav noted on CT - s/p IR aspiration of fluid yesterday, cx pending  - if collection reaccumulates, that would be more suggestive of leak - daily iodoform packing to midline wound - cont clears, suppository today - mobilize as able  FEN: CLD, IVF VTE: heparin gtt ID: [email or web address]  Follow up: Dr. Arnoldo Morale  LOS: 16 days    Norm Parcel , [facility] 05/08/2020, 8:04 AM Please see Amion for pager number during day hours 7:00am-4:30pm

## 2020-05-08 NOTE — Progress Notes (Signed)
[facility]                                                                              Patient Demographics  Maria Murphy, is a 64 y.o. female, DOB - July 17, 1956, WUJ:[id number]  Admit date - 04/21/2020   Admitting Physician Emeline General, MD  Outpatient Primary MD for the patient is Avon Gully, MD  Outpatient specialists:   LOS - 16  days   Medical records reviewed and are as summarized below:    Chief Complaint  Patient presents with  . Abdominal Pain       Brief summary   64 year old female, hospitalizedto [facility] on8/29,with the working diagnosis biliary pancreatitis,chronic cholecystitis/cholelithiasis. Patient underwent further work-up with MRCP, which showed free fluid in the abdomen and pneumoperitoneum. Patient was placed on broad-spectrum antibiotic therapy and vasopressors, she underwent emergent laparotomy. Patient was found to have a perforation in the prepyloric area along the lesser curvature of the stomach and antrum, and a Cheree Ditto application was performed. Surgical culture positive for Enterococcus and Candida. Patient was stabilized with antibiotic therapy,butshe developed worsening leukocytosis.  A CT of the abdomen pelvis on 9/7 showed a new 5.3 x 14 x 3.9 cm abscess in the pelvis,5.1 x 2.6 x 2.0 subcapsular fluid collection along the right inferior liver, also concerning for abscess. CT of the chest with multifocal pulmonary embolus, pulmonary emboli arising in the saddledistribution from each main pulmonary artery with extension into multiple lower lobe point arteries bilaterally. Positive right heart strain,consistent with submassive. She underwent 12 F drain placement into the low abdominopelvic fluid collection. Culture positive for enterococcus and candida. Patient has been placed on heparin drip. Echo with preserved RV function  Follow up CT abdomen and pelvis (09/13), after completing 2 weeks of antibiotic therapy with  development of 3.4x1.8x.19 right upper abdominal gas and fluid collection in the area of prior pre-pyloric gastric ulcer perforation. New subhepatic abscess, with cutaneous fistula General surgery, IR reconsulted    Assessment & Plan    Principal Problem: Acute submassive pulmonary embolism/saddle embolism with multiple bilateral embolus/acute hypoxic respiratory failure -2D echo with preserved LV and RV function. -Patient was placed on IV heparin drip, subsequently transitioned to Xarelto -Xarelto held due to new abdominal collection/abscess and patient was changed back to heparin drip  -Continue heparin drip per pharmacy protocol   Active Problems: Perforated gastric ulcer status post ex lap and Graham patch by Dr. Lovell Sheehan 8/30 Pelvic abscess status post IR drainage, cultures Enterococcus, Candida -General surgery and IR following, status post IR aspiration of the fluid, cultures pending -Per surgery, if collection reaccumulate, would be more suggestive of leak, remains high risk for peritonitis -Continue dressing change, mobilize, clear liquid diet -Continue Zosyn, fluconazole -Continue IV PPI   Essential hypertension BP currently stable  History of depression Currently stable, as needed lorazepam  Hypokalemia -Resolved, potassium 3.6  Constipation -Add bowel regimen, Colace and Dulcolax   Obesity Estimated body mass index is 38.6 kg/m as calculated from the following:   Height as of this encounter: 5\' 4"  (1.626 m).   Weight as of this encounter: 102 kg.  Code Status: Full  CODE STATUS DVT Prophylaxis: Heparin drip Family Communication: Discussed all imaging results, lab results, explained to the patient    Disposition Plan:     Status is: Inpatient  Remains inpatient appropriate because:Inpatient level of care appropriate due to severity of illness   Dispo: The patient is from: Home              Anticipated d/c is to: SNF              Anticipated d/c date  is: > 3 days              Patient currently is not medically stable to d/c.      Time Spent in minutes      Procedures:  Laparotomy IR pelvic drain to abscess  Consultants:   General surgery Infectious disease Interventional radiology  Antimicrobials:   Anti-infectives (From admission, onward)   Start     Dose/Rate Route Frequency Ordered Stop   05/07/20 1000  fluconazole (DIFLUCAN) IVPB 400 mg        400 mg 100 mL/hr over 120 Minutes Intravenous Every 24 hours 05/06/20 2353 05/14/20 0959   05/07/20 0000  piperacillin-tazobactam (ZOSYN) IVPB 3.375 g  Status:  Discontinued        3.375 g 100 mL/hr over 30 Minutes Intravenous Every 8 hours 05/06/20 2353 05/06/20 2355   05/07/20 0000  piperacillin-tazobactam (ZOSYN) IVPB 3.375 g        3.375 g 12.5 mL/hr over 240 Minutes Intravenous Every 8 hours 05/06/20 2356 05/13/20 2359   05/06/20 1600  piperacillin-tazobactam (ZOSYN) IVPB 3.375 g        3.375 g 100 mL/hr over 30 Minutes Intravenous  Once 05/06/20 0947 05/06/20 1708   04/26/20 1000  fluconazole (DIFLUCAN) IVPB 400 mg        400 mg 100 mL/hr over 120 Minutes Intravenous Every 24 hours 04/26/20 0907 05/06/20 2359   04/22/20 0100  piperacillin-tazobactam (ZOSYN) IVPB 3.375 g  Status:  Discontinued        3.375 g 12.5 mL/hr over 240 Minutes Intravenous Every 8 hours 04/22/20 0002 05/06/20 0946   04/22/20 0000  piperacillin-tazobactam (ZOSYN) IVPB 3.375 g  Status:  Discontinued        3.375 g 100 mL/hr over 30 Minutes Intravenous Every 8 hours 04/21/20 2349 04/22/20 0002          Medications  Scheduled Meds: . bisacodyl  10 mg Rectal Daily  . Chlorhexidine Gluconate Cloth  6 each Topical Daily  . feeding supplement (ENSURE ENLIVE)  237 mL Oral BID BM  . Gerhardt's butt cream   Topical Daily  . multivitamin with minerals  1 tablet Oral Daily  . pantoprazole (PROTONIX) IV  40 mg Intravenous Q12H  . sodium chloride flush  5 mL Intracatheter Q8H    Continuous Infusions: . sodium chloride 10 mL/hr at 05/07/20 1600  . dextrose 5% lactated ringers 75 mL/hr at 05/08/20 0221  . fluconazole (DIFLUCAN) IV Stopped (05/07/20 1049)  . heparin 900 Units/hr (05/08/20 0425)  . piperacillin-tazobactam (ZOSYN)  IV 3.375 g (05/08/20 0919)   PRN Meds:.sodium chloride, acetaminophen **OR** acetaminophen, albuterol, LORazepam, morphine injection, ondansetron **OR** ondansetron (ZOFRAN) IV, oxyCODONE, phenol      Subjective:   Maria Murphy was seen and examined today.  No acute issues overnight, still has abdominal discomfort 5/10.  States no BM.  No fever chills, no active nausea vomiting.  Objective:   Vitals:   05/07/20 1956 05/07/20 2351 05/08/20 0357 05/08/20  0800  BP: 131/81 (!) 142/83 124/75 132/70  Pulse: 95 89 83 80  Resp: (!) 22 19 17 18   Temp: 98.5 F (36.9 C) 99 F (37.2 C) 98.5 F (36.9 C) 98.6 F (37 C)  TempSrc: Oral Oral Oral Oral  SpO2: 96% 96% 93% 94%  Weight:      Height:        Intake/Output Summary (Last 24 hours) at 05/08/2020 0946 Last data filed at 05/08/2020 2952 Gross per 24 hour  Intake 2206.17 ml  Output 2220 ml  Net -13.83 ml     Wt Readings from Last 3 Encounters:  04/25/20 102 kg  03/25/20 90.7 kg  11/21/19 95.3 kg     Exam  General: Alert and oriented x 3, NAD  Cardiovascular: S1 S2 auscultated, no murmurs, RRR  Respiratory: Clear to auscultation bilaterally, no wheezing, rales or rhonchi  Gastrointestinal: Soft, midline incision, dressing intact, LLQ drain  Ext: no pedal edema bilaterally  Neuro: no new deficits  Musculoskeletal: No digital cyanosis, clubbing  Skin: No rashes  Psych: Normal affect and demeanor, alert and oriented x3    Data Reviewed:  I have personally reviewed following labs and imaging studies  Micro Results Recent Results (from the past 240 hour(s))  SARS Coronavirus 2 by RT PCR (hospital order, performed in [facility] hospital lab) Nasopharyngeal  Nasopharyngeal Swab     Status: None   Collection Time: 04/28/20 12:38 PM   Specimen: Nasopharyngeal Swab  Result Value Ref Range Status   SARS Coronavirus 2 NEGATIVE NEGATIVE Final    Comment: (NOTE) SARS-CoV-2 target nucleic acids are NOT DETECTED.  The SARS-CoV-2 RNA is generally detectable in upper and lower respiratory specimens during the acute phase of infection. The lowest concentration of SARS-CoV-2 viral copies this assay can detect is 250 copies / mL. A negative result does not preclude SARS-CoV-2 infection and should not be used as the sole basis for treatment or other patient management decisions.  A negative result may occur with improper specimen collection / handling, submission of specimen other than nasopharyngeal swab, presence of viral mutation(s) within the areas targeted by this assay, and inadequate number of viral copies (<250 copies / mL). A negative result must be combined with clinical observations, patient history, and epidemiological information.  Fact Sheet for Patients:   BoilerBrush.com.cy  Fact Sheet for Healthcare Providers: https://pope.com/  This test is not yet approved or  cleared by the [place] FDA and has been authorized for detection and/or diagnosis of SARS-CoV-2 by FDA under an Emergency Use Authorization (EUA).  This EUA will remain in effect (meaning this test can be used) for the duration of the COVID-19 declaration under Section 564(b)(1) of the Act, 21 U.S.C. section 360bbb-3(b)(1), unless the authorization is terminated or revoked sooner.  Performed at [facility], [address], [place], [place] [address]   Aerobic/Anaerobic Culture (surgical/deep wound)     Status: None   Collection Time: 05/01/20  2:43 PM   Specimen: Abscess  Result Value Ref Range Status   Specimen Description   Final    ABSCESS Performed at [facility], [address], [place], [place] [address]     Special Requests ABSCESS  Final   Gram Stain   Final    ABUNDANT WBC PRESENT,BOTH PMN AND MONONUCLEAR NO ORGANISMS SEEN    Culture   Final    FEW CANDIDA ALBICANS NO ANAEROBES ISOLATED Performed at [facility], [address] [place], [place] [address]  Report Status 05/06/2020 FINAL  Final  SARS Coronavirus 2 by RT PCR (hospital order, performed in [facility] hospital lab) Nasopharyngeal Nasopharyngeal Swab     Status: None   Collection Time: 05/05/20  6:00 PM   Specimen: Nasopharyngeal Swab  Result Value Ref Range Status   SARS Coronavirus 2 NEGATIVE NEGATIVE Final    Comment: (NOTE) SARS-CoV-2 target nucleic acids are NOT DETECTED.  The SARS-CoV-2 RNA is generally detectable in upper and lower respiratory specimens during the acute phase of infection. The lowest concentration of SARS-CoV-2 viral copies this assay can detect is 250 copies / mL. A negative result does not preclude SARS-CoV-2 infection and should not be used as the sole basis for treatment or other patient management decisions.  A negative result may occur with improper specimen collection / handling, submission of specimen other than nasopharyngeal swab, presence of viral mutation(s) within the areas targeted by this assay, and inadequate number of viral copies (<250 copies / mL). A negative result must be combined with clinical observations, patient history, and epidemiological information.  Fact Sheet for Patients:   BoilerBrush.com.cy  Fact Sheet for Healthcare Providers: https://pope.com/  This test is not yet approved or  cleared by the [place] FDA and has been authorized for detection and/or diagnosis of SARS-CoV-2 by FDA under an Emergency Use Authorization (EUA).  This EUA will remain in effect (meaning this test can be used) for the duration of the COVID-19 declaration under Section 564(b)(1) of the Act, 21 U.S.C. section  360bbb-3(b)(1), unless the authorization is terminated or revoked sooner.  Performed at [facility], [address], [place], [place] [address]   Aerobic/Anaerobic Culture (surgical/deep wound)     Status: None (Preliminary result)   Collection Time: 05/07/20  5:22 PM   Specimen: Abdomen; Abscess  Result Value Ref Range Status   Specimen Description ABDOMEN ABSCESS  Final   Special Requests   Final    SUB HEPATIC FLUID NEAR PRIOR GASTRIC ULCER PERFORATION   Gram Stain   Final    ABUNDANT WBC PRESENT, PREDOMINANTLY PMN NO ORGANISMS SEEN Performed at [facility], [address], [place], [place] [address]    Culture PENDING  Incomplete   Report Status PENDING  Incomplete    Radiology Reports DG Chest 1 View  Result Date: 05/02/2020 CLINICAL DATA:  At dyspnea EXAM: CHEST  1 VIEW COMPARISON:  Portable exam 1540 hours compared to 04/30/2020 FINDINGS: Chronic elevation of RIGHT diaphragm. Enlargement of cardiac silhouette. Mediastinal contours and pulmonary vascularity normal. Persistent RIGHT basilar atelectasis. No infiltrate, pleural effusion or pneumothorax. IMPRESSION: Persistent RIGHT basilar atelectasis. Electronically Signed   By: Ulyses Southward M.D.   On: 05/02/2020 15:55   CT Head Wo Contrast  Result Date: 04/20/2020 CLINICAL DATA:  Mental status change EXAM: CT HEAD WITHOUT CONTRAST TECHNIQUE: Contiguous axial images were obtained from the base of the skull through the vertex without intravenous contrast. COMPARISON:  CT brain 03/19/2020 FINDINGS: Brain: No acute territorial infarction, hemorrhage, or intracranial mass. The ventricles are nonenlarged. Vascular: No hyperdense vessels.  No unexpected calcification. Skull: Normal. Negative for fracture or focal lesion. Sinuses/Orbits: No acute finding. Other: None IMPRESSION: Negative non contrasted CT appearance of the brain. Electronically Signed   By: Jasmine Pang M.D.   On: 04/20/2020 21:14   CT ANGIO CHEST PE W OR WO  CONTRAST  Result Date: 05/01/2020 CLINICAL DATA:  Pulmonary embolus seen on recent abdominal CT. Recent repair of gastric ulcer. EXAM: CT ANGIOGRAPHY  CHEST WITH CONTRAST TECHNIQUE: Multidetector CT imaging of the chest was performed using the standard protocol during bolus administration of intravenous contrast. Multiplanar CT image reconstructions and MIPs were obtained to evaluate the vascular anatomy. CONTRAST:  OMNIPAQUE IOHEXOL 350 MG/ML SOLN COMPARISON:  CT abdomen and pelvis including lower lung regions April 30, 2020 FINDINGS: Cardiovascular: Pulmonary emboli arise in each main pulmonary artery in a saddle type distribution with extension of pulmonary emboli into multiple lower lobe pulmonary arterial branches bilaterally. Right ventricle to left ventricle diameter ratio is measured at 1.1 consistent with a degree of right heart strain. There is no appreciable thoracic aortic aneurysm or dissection. Visualized great vessels appear normal. No pericardial effusion or pericardial thickening is evident. Mediastinum/Nodes: Thyroid appears unremarkable. No adenopathy is evident. There is a small hiatal hernia. Lungs/Pleura: There are fairly small pleural effusions bilaterally with consolidation in each lower lobe region, somewhat more on the right than on the left. Upper lung regions are clear. No pneumothorax. Upper Abdomen: There is a mild degree of ascites. There is mild pneumoperitoneum which was present 1 day prior. Incomplete visualization of upper pole renal cystic lesions again noted with complex lesion arising from the periphery of the upper pole the right kidney, better seen on prior CT of the abdomen. Cholelithiasis again noted. Postoperative changes noted in the upper right abdomen, incompletely visualized. Air in the midline upper abdominal wall region likely of recent postoperative etiology. Musculoskeletal: Degenerative change noted in the thoracic spine. No blastic or lytic bone lesions.  No appreciable chest wall lesions. Review of the MIP images confirms the above findings. IMPRESSION: 1. Again noted multifocal pulmonary embolus with pulmonary emboli arising in a saddle type distribution from each main pulmonary artery with extension into multiple lower lobe pulmonary arteries bilaterally. Positive for acute PE with CTevidence of right heart strain (RV/LV Ratio = 1.1) consistent with at least submassive (intermediate risk) PE. The presence of right heart strain has been associated with an increased risk of morbidity and mortality. 2. Airspace consolidation consistent with combination of pneumonia and compressive atelectasis in each lower lung region with fairly small pleural effusions bilaterally. 3.  No appreciable adenopathy. 4.  Small hiatal hernia again noted. 5. Pneumoperitoneum likely secondary to recent surgery. Mild ascites present. Postoperative changes in right upper quadrant again noted. Air in the upper abdominal wall is likely of recent postoperative etiology. 6.  Cholelithiasis. 7. Incomplete visualization of renal cystic lesions with complex mass arising from upper pole right kidney, better seen and characterized on recent abdominal CT examination. These results will be called to the ordering clinician or representative by the Radiologist Assistant, and communication documented in the PACS or [employer]. Electronically Signed   By: Bretta Bang III M.D.   On: 05/01/2020 11:22   CT ABDOMEN PELVIS W CONTRAST  Addendum Date: 05/06/2020   ADDENDUM REPORT: 05/06/2020 22:39 ADDENDUM: These results were called by telephone at the time of interpretation on 05/06/2020 at 10:38 pm to provider Dr. Leafy Half, who verbally acknowledged these results. Electronically Signed   By: Tish Frederickson M.D.   On: 05/06/2020 22:39   Result Date: 05/06/2020 CLINICAL DATA:  Intra-abdominal abscess follow-up in a patient with perforated pre-pyloric gastric ulcer status post repair EXAM: CT  ABDOMEN AND PELVIS WITH CONTRAST TECHNIQUE: Multidetector CT imaging of the abdomen and pelvis was performed using the standard protocol following bolus administration of intravenous contrast. CONTRAST:  OMNIPAQUE IOHEXOL 300 MG/ML  SOLN COMPARISON:  CT abdomen pelvis 04/30/2020, CT  abdomen pelvis 04/20/2020, MR abdomen 04/22/2020 FINDINGS: Lower chest: Interval decrease in residual trace bilateral pleural effusions. Associated atelectasis of the bilateral, right greater than left lower lobes. Hepatobiliary: Interval decrease in size of a 3.4 x 2.1 x 1.8 cm (from 5.1 x 2.1 x 2cm) subcapsular fluid collection along the right inferior hepatic lobe (3:32). Subcentimeter hypodensity within the right hepatic dome is still too small to characterize. Cholelithiasis again noted. No biliary ductal dilatation. Pancreas: Redemonstration several fluid density lesions with associated calcifications open (3:40) within the head of the pancreas measuring up to 2.2 cm (3:40). The lesions are better evaluated on MRI abdomen 04/22/2020. Otherwise no main pancreatic duct dilatation distally. No abnormal pancreatic contour. Spleen: Normal in size without focal abnormality. Adrenals/Urinary Tract: No adrenal nodule bilaterally. Redemonstration of bilateral peripelvic cysts. Bilateral subcentimeter hypodensities are too small to characterize. Similar-appearing 2.9 cm fluid density lesion that likely represents a simple renal cyst within the left kidney. Redemonstration of a 2.3 cm heterogeneous exophytic lesion arising from the right kidney (3:34) that likely represents a benign proteinaceous versus hemorrhagic cyst per MR abdomen 04/22/20. Bilateral kidneys enhance symmetrically. No hydronephrosis. No hydroureter. The urinary bladder is unremarkable. Stomach/Bowel: PO contrast is noted to reach the rectum. No PO contrast extravasation. Stomach is within normal limits. Appendix appears normal. No evidence of bowel wall thickening,  distention, or inflammatory changes. Vascular/Lymphatic: No significant vascular findings are present. No enlarged abdominal or pelvic lymph nodes. Reproductive: Fibroid uterus.  No adnexal mass. Other: Trace free intraperitoneal fluid. Persistent free intraperitoneal gas within the upper abdomen with stable to slightly increased volume. Interval development of a new 3.4 x 1.8 x 1.9 cm gas and fluid collection just anterior to the gallbladder fundus (3:34) that appears to be continuous with the site of previous pre-pyloric gastric ulcer perforation (6:48-54). Interval placement of a left pelvic drain that terminates within a known irregular fluid and gas organized collection that has decreased in size with its largest pocket of fluid measuring 8 x 5.1 cm open (from 11 x 5 cm). Musculoskeletal: Suggestion of a cutaneous tract between the new right upper abdomen gas and fluid collection and upper mid subcutaneous soft tissues of the anterior abdomen/incision (3:36, 7:71). Healing anterior abdominal incision with nonspecific soft tissue densities along the peritoneum (3:64, 73) likely related to recent surgery/closure. Mild subcutaneous soft tissue edema. No acute or significant osseous findings. Multilevel degenerative changes of the spine. IMPRESSION: 1. Interval development of a 3.4 x 1.8 x 1.9 cm right upper abdominal gas and fluid collection just anterior to the gallbladder fundus that appears to be continuous with the area of prior pre-pyloric gastric ulcer perforation (and therefore in keeping with a persistent enteric leak). This new subhepatic abscess also appears to be continuous with the upper abdominal subcutaneous soft tissues/incision consistent with a cutaneous fistula. 2. Interval decrease in size of a 3.4 x 2.1 x 1.8 cm subcapsular right hepatic fluid collection. 3. Interval placement of a left pelvic drain with interval decrease in size of a left pelvic gas and fluid collection with the largest pocket  now measuring 8 x 5 cm (from 11 x 5 cm). 4. Interval decrease in bilateral trace pleural effusions with bibasilar atelectasis. 5. Other imaging findings of potential clinical significance: Cholelithiasis. Similar-appearing cystic pancreatic head and neck lesions measuring up to 2.2 cm with associated calcifications that are better evaluated on MR abdomen 04/22/2020. Stable 2.3 cm heterogeneous right renal lesion that likely represents a benign proteinaceous versus hemorrhagic cyst per  MRI abdomen 04/22/2020. Electronically Signed: By: Tish Frederickson M.D. On: 05/06/2020 22:23   CT ABDOMEN PELVIS W CONTRAST  Addendum Date: 05/01/2020   ADDENDUM REPORT: 05/01/2020 08:21 ADDENDUM: Upon further review, there are new thin non-occlusive saddle pulmonary emboli involving the left and right pulmonary arteries with some extension into both lower lobes. Dedicated CTA of the chest is recommended for further evaluation. Critical Value/emergent results were called by telephone on 05/01/2020 at 8:20 am to provider [facility], who verbally acknowledged these results. Electronically Signed   By: Obie Dredge M.D.   On: 05/01/2020 08:21   Result Date: 05/01/2020 CLINICAL DATA:  History of perforated pre-pyloric gastric ulcer status post repair 8 days ago, now with worsening epigastric pain and vomiting. EXAM: CT ABDOMEN AND PELVIS WITH CONTRAST TECHNIQUE: Multidetector CT imaging of the abdomen and pelvis was performed using the standard protocol following bolus administration of intravenous contrast. CONTRAST:  OMNIPAQUE IOHEXOL 300 MG/ML  SOLN COMPARISON:  MRI abdomen dated April 22, 2020. CT abdomen pelvis dated April 20, 2020. FINDINGS: Lower chest: Unchanged small right and trace left pleural effusions with adjacent lower lobe atelectasis. Hepatobiliary: Unchanged subcentimeter low-density lesion in the hepatic dome, too small to characterize. A small amount of perihepatic fluid with more focal 5.1 x 2.1 x 2.0 cm  subcapsular fluid collection along the inferior right liver (series 2, image 31: Series 5, image 70) multiple gallstones again noted. No gallbladder wall thickening or biliary dilatation. Pancreas: Multiple cystic lesions in pancreatic head measuring up to 2.2 cm are unchanged. No ductal dilatation or surrounding inflammatory changes. Spleen: Normal in size without focal abnormality. Adrenals/Urinary Tract: Adrenal glands are unremarkable. Unchanged bilateral renal cysts, including a 1.8 cm complex cyst in the upper pole of the right kidney as characterized on recent MRI. No renal calculi or hydronephrosis. Bladder is unremarkable. Stomach/Bowel: Unchanged small hiatal hernia. Mildly pre-pyloric gastric wall thickening is likely postsurgical. There are few mildly dilated loops of jejunum in the mid and lower abdomen with gradual transition to ileum. Oral contrast reaches the terminal ileum. No bowel wall thickening or surrounding inflammatory changes. Left-sided colonic diverticulosis. Normal appendix. Vascular/Lymphatic: No significant vascular findings. No enlarged abdominal or pelvic lymph nodes. Reproductive: Unchanged fibroid uterus.  No adnexal mass. Other: New 5.3 x 14.0 x 3.9 cm (AP by transverse by CC) rim enhancing fluid collection in the pelvis anterior to the uterine fundus. There are few residual foci of pneumoperitoneum in the right upper quadrant and deep to the midline abdominal incision. Musculoskeletal: No acute or significant osseous findings. IMPRESSION: 1. New 5.3 x 14.0 x 3.9 cm abscess in the pelvis. 2. Small amount of perihepatic fluid with more focal 5.1 x 2.1 x 2.0 cm subcapsular fluid collection along the inferior right liver, also concerning for abscess. 3. Trace residual pneumoperitoneum in the right upper quadrant and deep to the midline abdominal incision, likely postsurgical. 4. Mildly dilated loops of small bowel in the mid and lower abdomen are favored to represent ileus as oral  contrast reaches the terminal ileum. 5. Unchanged cystic lesions in the pancreatic head measuring up to 2.2 cm, better evaluated on recent MRI. Please see that report for follow-up recommendations. 6. Unchanged cholelithiasis. 7. Unchanged small right and trace left pleural effusions. Electronically Signed: By: Obie Dredge M.D. On: 04/30/2020 19:04   CT ABDOMEN PELVIS W CONTRAST  Result Date: 04/20/2020 CLINICAL DATA:  Acute abdominal pain. Elevated bilirubin. Altered mental status. EXAM: CT ABDOMEN AND PELVIS WITH CONTRAST TECHNIQUE:  Multidetector CT imaging of the abdomen and pelvis was performed using the standard protocol following bolus administration of intravenous contrast. CONTRAST:  75mL OMNIPAQUE IOHEXOL 300 MG/ML  SOLN COMPARISON:  No prior abdominal imaging. FINDINGS: Lower chest: Mild hypoventilatory changes in the lung bases. No pleural fluid or confluent airspace disease. Hepatobiliary: 7 mm hypodensity in the right dome of the liver, series 2, image 12, too small to accurately characterize. Multiple cholesterol gallstones in the gallbladder. No pericholecystic inflammation or evidence of wall thickening. No common bile duct dilatation. Pancreas: Coarse calcification in the pancreatic head. There are at least 3 cystic lesions in the pancreatic head/uncinate process, largest measuring 2.2 cm, series 2, image 29. There is an adjacent 1.3 cm cystic structure and a 1 cm cyst more inferiorly. No definite peripancreatic fat stranding or inflammation. There is no pancreatic ductal dilatation. Spleen: Normal in size without focal abnormality. Adrenals/Urinary Tract: No adrenal nodule. There is a punctate calcification involving the medial limb of the left adrenal gland. Indeterminate lesion arising from the upper right kidney measures 17 x 18 mm has internal heterogeneity and peripheral calcifications. This is adjacent to a small cortical cyst. There are scattered additional cysts within the right  kidney as well as parapelvic cysts. Cortical as well as parapelvic cysts in the left kidney. No hydronephrosis. No perinephric edema. Urinary bladder is unremarkable. Stomach/Bowel: Small hiatal hernia with mild distal esophageal wall thickening. Stomach is unremarkable. Normal positioning of the duodenum and ligament of Treitz. There is no small bowel obstruction or inflammatory change. Cecum is high-riding in the right mid abdomen. Normal air-filled appendix. Transverse colonic tortuosity. Colonic diverticulosis involving the distal descending and sigmoid colon. No acute diverticulitis. Vascular/Lymphatic: Aortic atherosclerosis with mild tortuosity. No aortic aneurysm. The portal vein is patent. There is bi-iliac tortuosity. No bulky enlarged abdominopelvic lymph nodes. Reproductive: Globular enlarged uterus with multiple presumed fibroids, the largest appears projecting posteriorly and measures 5.6 cm. Right ovary tentatively visualized and normal. The left ovary is not definitively seen. Other: No free air or free fluid. Musculoskeletal: Multilevel degenerative change in the spine. Vacuum phenomena at L4-L5 and L5-S1. Prominent facet hypertrophy. Modic endplate changes at T11-T12. There are no acute or suspicious osseous abnormalities. IMPRESSION: 1. Cholelithiasis without gallbladder inflammation. No biliary dilatation. 2. Coarse calcification in the pancreatic head consistent with chronic pancreatitis. There are at least 3 cystic lesions in the pancreatic head/uncinate process, largest measuring 2.2 cm. These may represent sequela of prior pancreatitis and pseudocysts, however recommend further evaluation with pancreatic protocol MRI for further characterization. This should ideally be performed on an elective basis after resolution of acute event when patient is able to tolerate breath hold technique. 3. Indeterminate 17 x 18 mm lesion arising from the upper right kidney has internal heterogeneity and  peripheral calcifications. This likely represents a Bosniak 71F renal cyst, but is not definitively characterized on the current exam. Recommend MRI characterization of this lesion. There are additional simple cysts as well as parapelvic cysts in both kidneys. 4. Globular enlarged uterus with multiple presumed fibroids, the largest measuring 5.6 cm. 5. Small hiatal hernia with mild distal esophageal wall thickening, can be seen with reflux or esophagitis. 6. Colonic diverticulosis without acute inflammation. Aortic Atherosclerosis (ICD10-I70.0). Electronically Signed   By: Narda Rutherford M.D.   On: 04/20/2020 23:14   CT ASPIRATION  Result Date: 05/08/2020 INDICATION: 64 year old female with a history of Graham patch for perforated gastric ulcer, with new fluid collection in the sub a [place] space referred  for aspiration EXAM: CT-GUIDED ASPIRATE OF UPPER ABDOMINAL FLUID COLLECTION MEDICATIONS: The patient is currently admitted to the hospital and receiving intravenous antibiotics. The antibiotics were administered within an appropriate time frame prior to the initiation of the procedure. ANESTHESIA/SEDATION: Fentanyl 1.0 mcg IV; Versed 50 mg IV Moderate Sedation Time:  10 minutes The patient was continuously monitored during the procedure by the interventional radiology nurse under my direct supervision. COMPLICATIONS: None PROCEDURE: Informed written consent was obtained from the patient after a thorough discussion of the procedural risks, benefits and alternatives. All questions were addressed. Maximal Sterile Barrier Technique was utilized including caps, mask, sterile gowns, sterile gloves, sterile drape, hand hygiene and skin antiseptic. A timeout was performed prior to the initiation of the procedure. Patient was positioned supine position on CT gantry table. Scout CT was acquired for planning purposes. Patient is prepped and draped in the usual sterile fashion. 1% lidocaine was used for local anesthesia.  Using CT guidance, Yueh needle was advanced into the fluid collection of the subhepatic space. Aspirate of the gas and fluid collection was accomplished with approximately 3-4 cc of bloody fluid with some complexity. Culture was sent. Yueh catheter was removed and a sterile bandage was placed. Patient tolerated the procedure well and remained hemodynamically stable throughout. No complications were encountered and no significant blood loss. IMPRESSION: Status post CT-guided aspirate of subhepatic fluid collection, with a culture sent with some complex bloody fluid. Signed, Yvone Neu. Reyne Dumas, RPVI [facility] Radiology Electronically Signed   By: [name] D.O.   On: 05/08/2020 07:08   MR 3D Recon At Scanner  Result Date: 04/22/2020 CLINICAL DATA:  Pancreatitis suspected, characterize pancreatic lesions, characterize incidental right renal lesion EXAM: MRI ABDOMEN WITHOUT AND WITH CONTRAST (INCLUDING MRCP) TECHNIQUE: Multiplanar multisequence MR imaging of the abdomen was performed both before and after the administration of intravenous contrast. Heavily T2-weighted images of the biliary and pancreatic ducts were obtained, and three-dimensional MRCP images were rendered by post processing. CONTRAST:  7mL GADAVIST GADOBUTROL 1 MMOL/ML IV SOLN COMPARISON:  CT abdomen pelvis, 04/20/2020 FINDINGS: Lower chest: Small right pleural effusion associated atelectasis or consolidation. Trace left pleural effusion. Hepatobiliary: No mass or other parenchymal abnormality identified. Gallstones in the gallbladder. No gallbladder wall thickening. No biliary ductal dilatation. Pancreas: There is a multicystic fluid signal lesion in the anterior pancreatic head measuring 4.1 x 2.6 x 3.0 cm (series 5, image 26, series 4, image 12). There are multiple associated small calcifications. This is clearly distinct from the pancreatic duct and there is no pancreatic ductal dilatation.  No solid contrast enhancing component. Spleen:  Within normal limits in size and appearance. Adrenals/Urinary Tract: No masses identified. Numerous bilateral renal cysts, predominantly parapelvic although also with several cortical cysts. An exophytic cyst of the superior pole of the right kidney is partially calcified and demonstrates mixed intrinsic signal, but demonstrates no contrast enhancement. Remaining cysts are simple and fluid signal without enhancement. No evidence of hydronephrosis. Stomach/Bowel: There is gastric mucosal thickening of the antrum and pylorus and a possible small ulceration of the anterior gastric antrum (series 5, image 22). Vascular/Lymphatic: No pathologically enlarged lymph nodes identified. No abdominal aortic aneurysm demonstrated. Other:  There is new moderate volume ascites and pneumoperitoneum. Musculoskeletal: No suspicious bone lesions identified. IMPRESSION: 1. There is new moderate volume ascites throughout the abdomen and pneumoperitoneum, concerning for perforated viscus organ. There is gastric mucosal thickening of the antrum and pylorus and a possible small ulceration of  the anterior pylorus, suggesting a perforated gastric ulceration although nidus of perforation is not definitively localized. 2. There is a multicystic fluid signal lesion in the anterior pancreatic head measuring 4.1 cm. This is clearly distinct from the pancreatic duct and there are multiple associated small calcifications. Favor pancreatic pseudocyst although mucinous cystic neoplasm is a substantial differential consideration given appearance. Given lesion size recommend consideration of EUS and FNA for diagnosis. 3. No pancreatic ductal dilatation. 4. Numerous bilateral renal cysts, predominantly parapelvic although also with several cortical cysts. An exophytic cyst of the superior pole of the right kidney is partially calcified and demonstrates mixed intrinsic signal, but demonstrates no contrast  enhancement. This is consistent with a benign proteinaceous or hemorrhagic cyst. 5. Cholelithiasis. No evidence of acute cholecystitis. No biliary ductal dilatation. These results were called by telephone at the time of interpretation on 04/22/2020 at 10:17 am to Dr. Sherryll Burger, who verbally acknowledged these results. Electronically Signed   By: Lauralyn Primes M.D.   On: 04/22/2020 10:31   US Venous Img Lower Bilateral (DVT)  Result Date: 05/01/2020 CLINICAL DATA:  Recent pulmonary embolus EXAM: BILATERAL LOWER EXTREMITY VENOUS DUPLEX ULTRASOUND TECHNIQUE: Gray-scale sonography with graded compression, as well as color Doppler and duplex ultrasound were performed to evaluate the lower extremity deep venous systems from the level of the common femoral vein and including the common femoral, femoral, profunda femoral, popliteal and calf veins including the posterior tibial, peroneal and gastrocnemius veins when visible. The superficial great saphenous vein was also interrogated. Spectral Doppler was utilized to evaluate flow at rest and with distal augmentation maneuvers in the common femoral, femoral and popliteal veins. COMPARISON:  CT abdomen and pelvis including portions of lower chest April 30, 2020 FINDINGS: RIGHT LOWER EXTREMITY Common Femoral Vein: No evidence of thrombus. Normal compressibility, respiratory phasicity and response to augmentation. Saphenofemoral Junction: No evidence of thrombus. Normal compressibility and flow on color Doppler imaging. Profunda Femoral Vein: No evidence of thrombus. Normal compressibility and flow on color Doppler imaging. Femoral Vein: No evidence of thrombus. Normal compressibility, respiratory phasicity and response to augmentation. Popliteal Vein: No evidence of thrombus. Normal compressibility, respiratory phasicity and response to augmentation. Calf Veins: No evidence of thrombus. Normal compressibility and flow on color Doppler imaging. Superficial Great Saphenous Vein:  No evidence of thrombus. Normal compressibility. Venous Reflux:  None. Other Findings:  None. LEFT LOWER EXTREMITY Common Femoral Vein: No evidence of thrombus. Normal compressibility, respiratory phasicity and response to augmentation. Saphenofemoral Junction: No evidence of thrombus. Normal compressibility and flow on color Doppler imaging. Profunda Femoral Vein: No evidence of thrombus. Normal compressibility and flow on color Doppler imaging. Femoral Vein: No evidence of thrombus. Normal compressibility, respiratory phasicity and response to augmentation. Popliteal Vein: No evidence of thrombus. Normal compressibility, respiratory phasicity and response to augmentation. Calf Veins: No evidence of thrombus. Normal compressibility and flow on color Doppler imaging. Superficial Great Saphenous Vein: No evidence of thrombus. Normal compressibility. Venous Reflux:  None. Other Findings:  None. IMPRESSION: No evidence of deep venous thrombosis in either lower extremity. Electronically Signed   By: Bretta Bang III M.D.   On: 05/01/2020 10:17   DG Chest Port 1 View  Result Date: 04/30/2020 CLINICAL DATA:  Wheezing and coughing. EXAM: PORTABLE CHEST 1 VIEW COMPARISON:  One-view chest x-ray 04/22/2020 FINDINGS: Enteric tube was removed. Heart is enlarged. Pulmonary vascular congestion is again noted. Aeration and lung volumes are proved bilaterally. Chronic elevation of the right hemidiaphragm is noted. Mild degenerative changes are  noted in the cervical spine and shoulders. IMPRESSION: 1. Stable cardiomegaly and mild pulmonary vascular congestion. 2. Improved aeration and lung volumes bilaterally. Electronically Signed   By: Marin Roberts M.D.   On: 04/30/2020 05:12   DG Chest Port 1 View  Result Date: 04/22/2020 CLINICAL DATA:  Peri op EXAM: PORTABLE CHEST 1 VIEW COMPARISON:  04/22/2020, CT 04/20/2020, MRI 04/22/2020 FINDINGS: Esophageal tube tip overlies the gastric outlet. New cutaneous staples  over the right hemiabdomen. Low lung volumes. Borderline cardiomegaly. Patchy atelectasis at the bases. Resolution of previously noted free air beneath the right diaphragm. No pneumothorax. IMPRESSION: 1. Esophageal tube tip overlies the gastric outlet. 2. Low lung volumes with patchy basilar atelectasis. Resolution of previously noted free air beneath the right diaphragm Electronically Signed   By: Jasmine Pang M.D.   On: 04/22/2020 20:08   DG CHEST PORT 1 VIEW  Result Date: 04/22/2020 CLINICAL DATA:  Pneumoperitoneum. EXAM: PORTABLE CHEST 1 VIEW COMPARISON:  04/22/2020 MR and prior studies FINDINGS: Pneumoperitoneum underlying the RIGHT hemidiaphragm is noted, identified on earlier MRI. An NG tube is present with tip overlying the distal stomach. Bilateral LOWER lung opacities/atelectasis are present. There is no evidence of pneumothorax. IMPRESSION: 1. Pneumoperitoneum underlying the RIGHT hemidiaphragm. 2. Bilateral LOWER lung opacities/atelectasis. Electronically Signed   By: Harmon Pier M.D.   On: 04/22/2020 12:43   DG Chest Port 1 View  Result Date: 04/20/2020 CLINICAL DATA:  Altered mental status. EXAM: PORTABLE CHEST 1 VIEW COMPARISON:  September 09, 2014 FINDINGS: Mild, diffuse chronic appearing increased lung markings are seen without evidence of acute infiltrate, pleural effusion or pneumothorax. The cardiac silhouette is mildly enlarged. The visualized skeletal structures are unremarkable. IMPRESSION: Chronic appearing increased lung markings without evidence of acute or active cardiopulmonary disease. Electronically Signed   By: Aram Candela M.D.   On: 04/20/2020 21:05   DG UGI W SINGLE CM (SOL OR THIN BA)  Result Date: 04/25/2020 CLINICAL DATA:  Perforated prepyloric ulcer due to naproxen, post repair EXAM: WATER SOLUBLE UPPER GI SERIES TECHNIQUE: Single-column upper GI series was performed using water soluble contrast. CONTRAST:  ISOVUE-300 IOPAMIDOL (ISOVUE-300) INJECTION 61%  orally COMPARISON:  CT abdomen and pelvis 04/20/2020 FLUOROSCOPY TIME:  Fluoroscopy Time:  1 minutes 18 seconds Radiation Exposure Index (if provided by the fluoroscopic device): 49.5 mGy Number of Acquired Spot Images: 8 + multiple fluoroscopic screen captures FINDINGS: Scout image demonstrates tip of nasogastric tube projecting over stomach. JP drain projects over duodenal bulb, pylorus, and prepyloric region. Skin clips from upper abdominal laparotomy. Gaseous distention of stomach. Contrast opacifies the gastric lumen and with patient turned to RIGHT lateral decubitus position, empties from the stomach into duodenum. No evidence of gastric outlet obstruction. Mild bowel wall thickening of the pylorus, duodenal bulb and pre pyloric mucosa. No extraluminal contrast extravasation identified. No contrast is seen along the JP drain. IMPRESSION: Edema at the surgical region at the pylorus, pre pyloric, and duodenal bulb regions. No evidence of contrast extravasation. Electronically Signed   By: Ulyses Southward M.D.   On: 04/25/2020 10:11   MR ABDOMEN MRCP W WO CONTAST  Result Date: 04/22/2020 CLINICAL DATA:  Pancreatitis suspected, characterize pancreatic lesions, characterize incidental right renal lesion EXAM: MRI ABDOMEN WITHOUT AND WITH CONTRAST (INCLUDING MRCP) TECHNIQUE: Multiplanar multisequence MR imaging of the abdomen was performed both before and after the administration of intravenous contrast. Heavily T2-weighted images of the biliary and pancreatic ducts were obtained, and three-dimensional MRCP images were rendered by post processing.  CONTRAST:  7mL GADAVIST GADOBUTROL 1 MMOL/ML IV SOLN COMPARISON:  CT abdomen pelvis, 04/20/2020 FINDINGS: Lower chest: Small right pleural effusion associated atelectasis or consolidation. Trace left pleural effusion. Hepatobiliary: No mass or other parenchymal abnormality identified. Gallstones in the gallbladder. No gallbladder wall thickening. No biliary ductal  dilatation. Pancreas: There is a multicystic fluid signal lesion in the anterior pancreatic head measuring 4.1 x 2.6 x 3.0 cm (series 5, image 26, series 4, image 12). There are multiple associated small calcifications. This is clearly distinct from the pancreatic duct and there is no pancreatic ductal dilatation. No solid contrast enhancing component. Spleen:  Within normal limits in size and appearance. Adrenals/Urinary Tract: No masses identified. Numerous bilateral renal cysts, predominantly parapelvic although also with several cortical cysts. An exophytic cyst of the superior pole of the right kidney is partially calcified and demonstrates mixed intrinsic signal, but demonstrates no contrast enhancement. Remaining cysts are simple and fluid signal without enhancement. No evidence of hydronephrosis. Stomach/Bowel: There is gastric mucosal thickening of the antrum and pylorus and a possible small ulceration of the anterior gastric antrum (series 5, image 22). Vascular/Lymphatic: No pathologically enlarged lymph nodes identified. No abdominal aortic aneurysm demonstrated. Other:  There is new moderate volume ascites and pneumoperitoneum. Musculoskeletal: No suspicious bone lesions identified. IMPRESSION: 1. There is new moderate volume ascites throughout the abdomen and pneumoperitoneum, concerning for perforated viscus organ. There is gastric mucosal thickening of the antrum and pylorus and a possible small ulceration of the anterior pylorus, suggesting a perforated gastric ulceration although nidus of perforation is not definitively localized. 2. There is a multicystic fluid signal lesion in the anterior pancreatic head measuring 4.1 cm. This is clearly distinct from the pancreatic duct and there are multiple associated small calcifications. Favor pancreatic pseudocyst although mucinous cystic neoplasm is a substantial differential consideration given appearance. Given lesion size recommend consideration of EUS  and FNA for diagnosis. 3. No pancreatic ductal dilatation. 4. Numerous bilateral renal cysts, predominantly parapelvic although also with several cortical cysts. An exophytic cyst of the superior pole of the right kidney is partially calcified and demonstrates mixed intrinsic signal, but demonstrates no contrast enhancement. This is consistent with a benign proteinaceous or hemorrhagic cyst. 5. Cholelithiasis. No evidence of acute cholecystitis. No biliary ductal dilatation. These results were called by telephone at the time of interpretation on 04/22/2020 at 10:17 am to Dr. Sherryll Burger, who verbally acknowledged these results. Electronically Signed   By: Lauralyn Primes M.D.   On: 04/22/2020 10:31   ECHOCARDIOGRAM COMPLETE  Result Date: 05/02/2020    ECHOCARDIOGRAM REPORT   Patient Name:   VIKTORYA LEIFHEIT Date of Exam: 05/02/2020 Medical Rec #:  [id number]      Height:       64.0 in Accession #:    [id number]     Weight:       224.9 lb Date of Birth:  1955/10/14      BSA:          2.056 m Patient Age:    63 years       BP:           143/79 mmHg Patient Gender: F              HR:           71 bpm. Exam Location:  Inpatient Procedure: 2D Echo, Cardiac Doppler and Color Doppler Indications:    Pulmonary Embolus  History:        Patient has no prior  history of Echocardiogram examinations.                 Risk Factors:Hypertension and Obesity. Pulmonary embolus, s/p GI                 surgery.  Sonographer:    Lavenia Atlas Referring Phys: [id number] PING T ZHANG IMPRESSIONS  1. Left ventricular ejection fraction, by estimation, is 60 to 65%. The left ventricle has normal function. The left ventricle has no regional wall motion abnormalities. Left ventricular diastolic parameters were normal. There is the interventricular septum is flattened in systole, consistent with right ventricular pressure overload.  2. Right ventricular systolic function is normal. The right ventricular size is mildly enlarged. There is moderately elevated  pulmonary artery systolic pressure. The estimated right ventricular systolic pressure is 51.0 mmHg.  3. The mitral valve is normal in structure. Trivial mitral valve regurgitation. No evidence of mitral stenosis.  4. The aortic valve is tricuspid. Aortic valve regurgitation is not visualized. No aortic stenosis is present. Comparison(s): No prior Echocardiogram. FINDINGS  Left Ventricle: Left ventricular ejection fraction, by estimation, is 60 to 65%. The left ventricle has normal function. The left ventricle has no regional wall motion abnormalities. The left ventricular internal cavity size was normal in size. There is  no left ventricular hypertrophy. The interventricular septum is flattened in systole, consistent with right ventricular pressure overload. Left ventricular diastolic parameters were normal. Right Ventricle: The right ventricular size is mildly enlarged. No increase in right ventricular wall thickness. Right ventricular systolic function is normal. There is moderately elevated pulmonary artery systolic pressure. The tricuspid regurgitant velocity is 3.28 m/s, and with an assumed right atrial pressure of 8 mmHg, the estimated right ventricular systolic pressure is 51.0 mmHg. Left Atrium: Left atrial size was normal in size. Right Atrium: Right atrial size was normal in size. Pericardium: There is no evidence of pericardial effusion. Mitral Valve: The mitral valve is normal in structure. Trivial mitral valve regurgitation. No evidence of mitral valve stenosis. Tricuspid Valve: The tricuspid valve is normal in structure. Tricuspid valve regurgitation is trivial. No evidence of tricuspid stenosis. Aortic Valve: The aortic valve is tricuspid. Aortic valve regurgitation is not visualized. No aortic stenosis is present. Pulmonic Valve: The pulmonic valve was not well visualized. Pulmonic valve regurgitation is not visualized. Aorta: The aortic root and ascending aorta are structurally normal, with no  evidence of dilitation. Venous: The inferior vena cava was not well visualized. IAS/Shunts: The atrial septum is grossly normal.  LEFT VENTRICLE PLAX 2D LVIDd:         5.10 cm  Diastology LVIDs:         3.10 cm  LV e' medial:    12.70 cm/s LV PW:         1.00 cm  LV E/e' medial:  6.7 LV IVS:        1.00 cm  LV e' lateral:   10.90 cm/s LVOT diam:     2.40 cm  LV E/e' lateral: 7.8 LV SV:         118 LV SV Index:   57 LVOT Area:     4.52 cm  RIGHT VENTRICLE RV Basal diam:  3.20 cm RV S prime:     21.30 cm/s TAPSE (M-mode): 3.8 cm LEFT ATRIUM             Index       RIGHT ATRIUM           Index LA  diam:        3.70 cm 1.80 cm/m  RA Area:     15.60 cm LA Vol (A2C):   51.8 ml 25.19 ml/m RA Volume:   38.90 ml  18.92 ml/m LA Vol (A4C):   38.3 ml 18.63 ml/m LA Biplane Vol: 49.0 ml 23.83 ml/m  AORTIC VALVE LVOT Vmax:   126.00 cm/s LVOT Vmean:  84.700 cm/s LVOT VTI:    0.261 m  AORTA Ao Root diam: 2.90 cm MITRAL VALVE               TRICUSPID VALVE MV Area (PHT): 5.66 cm    TR Peak grad:   43.0 mmHg MV Decel Time: 134 msec    TR Vmax:        328.00 cm/s MV E velocity: 85.30 cm/s MV A velocity: 93.00 cm/s  SHUNTS MV E/A ratio:  0.92        Systemic VTI:  0.26 m                            Systemic Diam: 2.40 cm Jodelle Red MD Electronically signed by Jodelle Red MD Signature Date/Time: 05/02/2020/6:06:31 PM    Final    CT IMAGE GUIDED FLUID DRAIN BY CATHETER  Result Date: 05/02/2020 INDICATION: 64 year old female approximately 8 days status post open surgical repair of a perforated pre-pyloric gastric ulcer. Recent CT imaging demonstrated new accumulation of a fluid collection in the dependent anatomic pelvis anterior to the uterus and superior to the bladder. She presents for CT-guided drain placement. EXAM: CT-guided drain placement MEDICATIONS: The patient is currently admitted to the hospital and receiving intravenous antibiotics. The antibiotics were administered within an appropriate time frame  prior to the initiation of the procedure. ANESTHESIA/SEDATION: Fentanyl 100 mcg IV; Versed 2.5 mg IV Moderate Sedation Time:  27 minutes The patient was continuously monitored during the procedure by the interventional radiology nurse under my direct supervision. COMPLICATIONS: None immediate. PROCEDURE: Informed written consent was obtained from the patient after a thorough discussion of the procedural risks, benefits and alternatives. All questions were addressed. Maximal Sterile Barrier Technique was utilized including caps, mask, sterile gowns, sterile gloves, sterile drape, hand hygiene and skin antiseptic. A timeout was performed prior to the initiation of the procedure. A planning axial CT scan was performed. The fluid collection was successfully identified. There is a very small window approaching from inferior to the sigmoid colon. A skin entry site was selected and marked. The overlying skin was sterilely prepped and draped in the standard fashion using chlorhexidine skin prep. Local anesthesia was attained by infiltration with 1% lidocaine. A small dermatotomy was made. Under intermittent CT guidance, an 18 gauge trocar needle was carefully advanced under the sigmoid colon and into the fluid collection. A 0.035 wire was coiled in the fluid collection. The skin tract was dilated to 12 [place]. A Cook 12 [place] all-purpose drainage catheter was then advanced over the wire and formed. Aspiration yields approximately 30 mL of purulent appearing fluid. The fluid was sent for Gram stain and culture. The drainage catheter was gently flushed and secured to the skin with 0 Prolene suture and an adhesive fixation device. The patient tolerated the procedure well. IMPRESSION: Successful placement of a 12 French drainage catheter into the dependent low abdominal fluid collection. Aspiration yields 30 mL of purulent appearing fluid which was sent for Gram stain and culture. PLAN: 1. Maintain drain to JP bulb suction. 2.  Flush drainage catheter with 10 mL saline at least once per shift. 3. When drain output is minimal (serous and less than 15 mL per day for at least 48 hours) in the patient is clinically improved, the drainage catheter can be removed. Signed, Sterling Big, MD, RPVI [facility] Radiology Electronically Signed   By: Malachy Moan M.D.   On: 05/02/2020 09:36    Lab Data:  CBC: Recent Labs  Lab 05/02/20 0130 05/02/20 0130 05/03/20 0242 05/04/20 0146 05/05/20 0332 05/07/20 0330 05/08/20 0243  WBC 14.1*   < > 12.2* 12.0* 9.0 7.1 7.3  NEUTROABS 11.6*  --  9.9*  --   --   --  5.3  HGB 10.5*   < > 10.5* 10.1* 10.1* 11.0* 10.5*  HCT 31.2*   < > 30.8* 30.7* 31.0* 32.6* 31.9*  MCV 89.1   < > 88.5 87.0 88.1 87.6 87.2  PLT 203   < > 210 231 219 231 213   < > = values in this interval not displayed.   Basic Metabolic Panel: Recent Labs  Lab 05/02/20 0130 05/02/20 0130 05/03/20 0242 05/04/20 0146 05/05/20 0332 05/07/20 0330 05/08/20 0243  NA 135   < > 135 135 135 134* 136  K 4.0   < > 3.3* 3.2* 4.0 4.2 3.6  CL 98   < > 101 99 102 99 100  CO2 27   < > 25 25 25 26 24   GLUCOSE 109*   < > 108* 123* 138* 116* 125*  BUN 6*   < > 5* 7* 7* 7* 6*  CREATININE 0.75   < > 0.72 0.66 0.77 0.83 0.79  CALCIUM 7.9*   < > 7.9* 8.0* 8.3* 8.6* 8.5*  MG  --   --   --   --  1.8  --   --   PHOS 3.3  --   --   --   --   --   --    < > = values in this interval not displayed.   GFR: Estimated Creatinine Clearance: 83.6 mL/min (by C-G formula based on SCr of 0.79 mg/dL). Liver Function Tests: Recent Labs  Lab 05/02/20 0130 05/07/20 0330  AST 25 40  ALT 15 22  ALKPHOS 64 54  BILITOT 0.7 0.6  PROT 5.4* 6.7  ALBUMIN 2.0* 2.4*   No results for input(s): LIPASE, AMYLASE in the last 168 hours. No results for input(s): AMMONIA in the last 168 hours. Coagulation Profile: No results for input(s): INR, PROTIME in the last 168 hours. Cardiac  Enzymes: No results for input(s): CKTOTAL, CKMB, CKMBINDEX, TROPONINI in the last 168 hours. BNP (last 3 results) No results for input(s): PROBNP in the last 8760 hours. HbA1C: No results for input(s): HGBA1C in the last 72 hours. CBG: Recent Labs  Lab 05/05/20 1621  GLUCAP 133*   Lipid Profile: No results for input(s): CHOL, HDL, LDLCALC, TRIG, CHOLHDL, LDLDIRECT in the last 72 hours. Thyroid Function Tests: No results for input(s): TSH, T4TOTAL, FREET4, T3FREE, THYROIDAB in the last 72 hours. Anemia Panel: No results for input(s): VITAMINB12, FOLATE, FERRITIN, TIBC, IRON, RETICCTPCT in the last 72 hours. Urine analysis:    Component Value Date/Time   COLORURINE YELLOW 04/20/2020 2020   APPEARANCEUR CLEAR 04/20/2020 2020   LABSPEC 1.030 04/20/2020 2020   PHURINE 5.0 04/20/2020 2020   GLUCOSEU NEGATIVE 04/20/2020 2020   HGBUR NEGATIVE 04/20/2020 2020   BILIRUBINUR MODERATE (A) 04/20/2020 2020  KETONESUR NEGATIVE 04/20/2020 2020   PROTEINUR NEGATIVE 04/20/2020 2020   NITRITE NEGATIVE 04/20/2020 2020   LEUKOCYTESUR NEGATIVE 04/20/2020 2020     Channelle Bottger M.D. [facility] 05/08/2020, 9:46 AM   Call night coverage person covering after 7pm

## 2020-05-08 NOTE — Evaluation (Signed)
Occupational Therapy Evaluation Patient Details Name: Maria Murphy MRN: [id number] DOB: August 16, 1956 Today's Date: 05/08/2020    History of Present Illness Pt adm to [facility] on 8/29 with biliary pancreatitis, chronic cholecystitis. Pt found to have gastric perforation and underwent emergent laparotomy for repair. On 9/7 pt found to have pelvic abscesses and drain placed. 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 Transferred to [facility] on 9/8.  PMH - HTN, foot ulcer, memory loss, obesity.   Clinical Impression   Pt was functioning modified independently with a cane prior to admission. Presents with abdominal pain, generalized weakness and poor standing balance. She requires mod assist for bed mobility, +2 mod to stand and min + second person for safety with ambulation with RW. She needs set up to total assist for ADL. Pt is highly motivated to return to independence. Will follow acutely.    Follow Up Recommendations  SNF;Supervision/Assistance - 24 hour    Equipment Recommendations       Recommendations for Other Services       Precautions / Restrictions Precautions Precautions: Fall Precaution Comments: painful abdominal wound, premedicate      Mobility Bed Mobility Overal bed mobility: Needs Assistance Bed Mobility: Rolling;Sidelying to Sit Rolling: Mod assist Sidelying to sit: Mod assist       General bed mobility comments: rolled to minimize abdominal pain, assist for LEs over EOB to and to raise trunk  Transfers Overall transfer level: Needs assistance Equipment used: Rolling walker (2 wheeled) Transfers: Sit to/from Stand Sit to Stand: +2 physical assistance;Mod assist         General transfer comment: cues for hand placement, assist to rise and steady    Balance Overall balance assessment: Needs assistance   Sitting balance-Leahy Scale: Fair       Standing balance-Leahy Scale: Poor Standing balance comment: reliant on B UE and external support                            ADL either performed or assessed with clinical judgement   ADL Overall ADL's : Needs assistance/impaired Eating/Feeding: Set up;Sitting   Grooming: Wash/dry face;Brushing hair;Sitting;Set up   Upper Body Bathing: Minimal assistance;Sitting   Lower Body Bathing: Total assistance;Sit to/from stand   Upper Body Dressing : Moderate assistance;Sitting   Lower Body Dressing: Total assistance;Bed level   Toilet Transfer: Minimal assistance;Ambulation;+2 for safety/equipment;RW;+2 for physical assistance   Toileting- Clothing Manipulation and Hygiene: Total assistance;Sit to/from stand       Functional mobility during ADLs: Minimal assistance;+2 for safety/equipment;Rolling walker;+2 for physical assistance       Vision Baseline Vision/History: No visual deficits       Perception     Praxis      Pertinent Vitals/Pain Pain Assessment: 0-10 Pain Score: 8  Pain Location: abdomen with mobility Pain Descriptors / Indicators: Grimacing;Guarding;Crying Pain Intervention(s): Repositioned;Patient requesting pain meds-RN notified     Hand Dominance Right   Extremity/Trunk Assessment Upper Extremity Assessment Upper Extremity Assessment: Overall WFL for tasks assessed;Generalized weakness   Lower Extremity Assessment Lower Extremity Assessment: Defer to PT evaluation   Cervical / Trunk Assessment Cervical / Trunk Assessment: Kyphotic   Communication Communication Communication: No difficulties   Cognition Arousal/Alertness: Awake/alert Behavior During Therapy: WFL for tasks assessed/performed Overall Cognitive Status: Within Functional Limits for tasks assessed  General Comments       Exercises     Shoulder Instructions      Home Living Family/patient expects to be discharged to:: Private residence Living Arrangements: Alone Available Help at Discharge: Friend(s);Available  PRN/intermittently Type of Home: Apartment Home Access: Stairs to enter Entrance Stairs-Number of Steps: 1 Entrance Stairs-Rails: None Home Layout: One level     Bathroom Shower/Tub: Teacher, early years/pre: Standard     Home Equipment: Cane - single point          Prior Functioning/Environment Level of Independence: Independent with assistive device(s)        Comments: Community ambulator using SPC, does not drive        OT Problem List: Decreased strength;Decreased activity tolerance;Impaired balance (sitting and/or standing);Decreased knowledge of use of DME or AE;Obesity;Pain      OT Treatment/Interventions: Self-care/ADL training;DME and/or AE instruction;Therapeutic activities;Patient/family education;Balance training    OT Goals(Current goals can be found in the care plan section) Acute Rehab OT Goals Patient Stated Goal: get stronger, less pain OT Goal Formulation: With patient Time For Goal Achievement: 05/22/20 Potential to Achieve Goals: Good ADL Goals Pt Will Perform Grooming: with min assist;standing Pt Will Perform Upper Body Bathing: with min assist;sitting Pt Will Perform Upper Body Dressing: with min assist;sitting Pt Will Transfer to Toilet: with min assist;ambulating;bedside commode Pt Will Perform Toileting - Clothing Manipulation and hygiene: with min assist;sit to/from stand Additional ADL Goal #1: Pt will perform bed mobility with min assist in preparation for ADL.  OT Frequency: Min 2X/week   Barriers to D/C:            Co-evaluation              AM-PAC OT "6 Clicks" Daily Activity     Outcome Measure Help from another person eating meals?: A Little Help from another person taking care of personal grooming?: A Little Help from another person toileting, which includes using toliet, bedpan, or urinal?: Total Help from another person bathing (including washing, rinsing, drying)?: A Lot Help from another person to put on  and taking off regular upper body clothing?: A Lot Help from another person to put on and taking off regular lower body clothing?: Total 6 Click Score: 12   End of Session Nurse Communication: Patient requests pain meds  Activity Tolerance: Patient limited by pain Patient left: in chair;with call bell/phone within reach;with nursing/sitter in room  OT Visit Diagnosis: Unsteadiness on feet (R26.81);Other abnormalities of gait and mobility (R26.89);Pain;Muscle weakness (generalized) (M62.81)                Time: 6226-3335 OT Time Calculation (min): 29 min Charges:  OT General Charges $OT Visit: 1 Visit OT Evaluation $OT Eval Moderate Complexity: 1 Mod  Nestor Lewandowsky, OTR/L Acute Rehabilitation Services Pager: [phone] Office: [phone]  Malka So 05/08/2020, 2:05 PM

## 2020-05-08 NOTE — Progress Notes (Signed)
[place] for Heparin Indication:  pulmonary embolism  Allergies  Allergen Reactions  . Wheat Extract Swelling    Patient Measurements: Height: 5\' 4"  (162.6 cm) Weight: 102 kg (224 lb 13.9 oz) IBW/kg (Calculated) : 54.7 Heparin Dosing Weight: 78.4 kg  Vital Signs: Temp: 98.5 F (36.9 C) (09/15 0357) Temp Source: Oral (09/15 0357) BP: 124/75 (09/15 0357) Pulse Rate: 83 (09/15 0357)  Labs: Recent Labs    05/07/20 0330 05/07/20 1555 05/07/20 1843 05/08/20 0243  HGB 11.0*  --   --  10.5*  HCT 32.6*  --   --  31.9*  PLT 231  --   --  213  APTT  --  >200* 168* 136*  HEPARINUNFRC  --  >2.20* >2.20*  --   CREATININE 0.83  --   --  0.79    Estimated Creatinine Clearance: 83.6 mL/min (by C-G formula based on SCr of 0.79 mg/dL).  Assessment: 64 y.o. female with recent PE, Xarelto on hold for CT aspiration, for heparin  Goal of Therapy:  aPTT 66-102 Heparin level 0.3-0.7 units/ml Monitor platelets by anticoagulation protocol: Yes   Plan:  Decrease Heparin 900 units/hr  Phillis Knack, PharmD, BCPS  05/08/2020,4:16 AM

## 2020-05-08 NOTE — Progress Notes (Signed)
Was told in report that patient abdo. Incision has been oozing fluid and they applied ABD pad. Change dressing this a.m. and patient has small open area under steri stripes at top of incision draining min. Amt seriosang. fluid . M.D. please address.

## 2020-05-08 NOTE — Progress Notes (Signed)
Patient ID: Maria Murphy, female   DOB: Jun 29, 1956, 64 y.o.   MRN: [id number] Pt's most recent CT was reviewed by Dr. Earleen Newport; there remains some residual fluid around existing left pelvic drain despite recent minimal output. Will schedule pt for image guided drain injection/ exchange/upsizing on 9/16. Pt aware and consent obtained.

## 2020-05-08 NOTE — TOC Progression Note (Signed)
Transition of Care [facility]) - Progression Note    Patient Details  Name: Maria Murphy MRN: [id number] Date of Birth: May 13, 1956  Transition of Care [facility]) CM/SW [place], [place] Phone Number: 05/08/2020, 9:57 AM  Clinical Narrative:     CSW confirmed bed offer with [facility], once the patient is medically stable.  Thurmond Butts, MSW, [place] Clinical Social Worker   Expected Discharge Plan: Skilled Nursing Facility Barriers to Discharge: Continued Medical Work up  Expected Discharge Plan and Services Expected Discharge Plan: [place] In-house Referral: Clinical Social Work   Post Acute Care Choice: [place] Living arrangements for the past 2 months: Apartment Expected Discharge Date: 05/06/20                                     Social Determinants of Health (SDOH) Interventions    Readmission Risk Interventions Readmission Risk Prevention Plan 04/29/2020  Medication Screening Complete  Transportation Screening Complete  Some recent data might be hidden

## 2020-05-08 NOTE — Progress Notes (Signed)
ANTICOAGULATION CONSULT NOTE - [place] for Heparin Indication: pulmonary embolus  Allergies  Allergen Reactions  . Wheat Extract Swelling    Patient Measurements: Height: 5\' 4"  (162.6 cm) Weight: 102 kg (224 lb 13.9 oz) IBW/kg (Calculated) : 54.7 Heparin Dosing Weight:  78.4 kg  Vital Signs: Temp: 99 F (37.2 C) (09/15 1615) Temp Source: Oral (09/15 1615) BP: 135/80 (09/15 1448) Pulse Rate: 85 (09/15 1448)  Labs: Recent Labs    05/07/20 0330 05/07/20 1555 05/07/20 1555 05/07/20 1843 05/07/20 1843 05/08/20 0243 05/08/20 1129 05/08/20 1757  HGB 11.0*  --   --   --   --  10.5*  --   --   HCT 32.6*  --   --   --   --  31.9*  --   --   PLT 231  --   --   --   --  213  --   --   APTT  --  >200*   < > 168*   < > 136* 82* 41*  HEPARINUNFRC  --  >2.20*  --  >2.20*  --  >2.20*  --   --   CREATININE 0.83  --   --   --   --  0.79  --   --    < > = values in this interval not displayed.    Estimated Creatinine Clearance: 83.6 mL/min (by C-G formula based on SCr of 0.79 mg/dL).   Assessment:  Heparin>Xarelto for submassive acute pulmonary embolism/ saddle with multiple bilateral embolus/per CTA 9/8; dopplers negative for DVT on 9/8.  - S/P peptic ulcer perforation - 9/14: surgery recommended stopping Xarelto (last dose 9/13 at 1637) and resuming heparin in preparation for surgery. Due to recent Xarelto exposure, will monitor anticoagulation using aPTT until aPTT and heparin levels correlate.  On 9/14, pt underwent CT aspiration of small sub-hepatic fluid collection. Per IR note, there remains residual fluid around existing L pelvin drain despite recent minimal input; pt scheduled for image-guided drain injection/exchange/upsizing on 9/16.  aPTT was within goal range at 82 sec this AM on heparin infusion at 900 units/hr; confirmatory aPTT drawn ~6.5 hrs later was subtherapeutic at 41 sec. CBC relatively stable. Per RN, no issues with IV or bleeding  observed.  Goal of Therapy:  aPTT 66-102 seconds Monitor platelets by anticoagulation protocol: Yes   Plan:  Increase heparin infusion to 1000 units/hr Check 6-hr aPTT, heparin level Monitor daily aPTT, heparin level, CBC Monitor for signs/symptoms of bleeding  Gillermina Hu, PharmD, BCPS, [facility] Clinical Pharmacist 05/08/2020,6:42 PM

## 2020-05-08 NOTE — Progress Notes (Signed)
Physical Therapy Treatment Patient Details Name: Maria Murphy MRN: [id number] DOB: [birth year] Today's Date: 05/08/2020    History of Present Illness Pt adm to [facility] on 8/29 with biliary pancreatitis, chronic cholecystitis. Pt found to have gastric perforation and underwent emergent laparotomy for repair. On 9/7 pt found to have pelvic abscesses and drain placed. 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 Transferred to [facility] on 9/8.  PMH - HTN, foot ulcer, memory loss, obesity.    PT Comments    Pt is making slow progress toward goals, today limited by pain.  She needed mod assist of 2 persons in general, but due to her motivation, she should progress well toward Independence.  Emphasis today on transition, scooting, sitting balance, sit to stand and short distance gait in the RW.   Follow Up Recommendations  SNF     Equipment Recommendations  Rolling walker with 5" wheels    Recommendations for Other Services       Precautions / Restrictions Precautions Precautions: Fall Precaution Comments: painful abdominal wound, premedicate    Mobility  Bed Mobility Overal bed mobility: Needs Assistance Bed Mobility: Rolling;Sidelying to Sit Rolling: Mod assist Sidelying to sit: Mod assist       General bed mobility comments: rolled to minimize abdominal pain, assist for LEs over EOB to and to raise trunk  Transfers Overall transfer level: Needs assistance Equipment used: Rolling walker (2 wheeled) Transfers: Sit to/from Stand Sit to Stand: +2 physical assistance;Mod assist         General transfer comment: cues for hand placement, assist to rise and steady  Ambulation/Gait Ambulation/Gait assistance: Mod assist Gait Distance (Feet): 6 Feet Assistive device: Rolling walker (2 wheeled) Gait Pattern/deviations: Decreased step length - right;Decreased step length - left;Shuffle Gait velocity: decreased Gait velocity interpretation: <1.31 ft/sec, indicative of  household ambulator General Gait Details: short low amplitude steps, stability assist.  Pt struggling with pain.   Stairs             Wheelchair Mobility    Modified Rankin (Stroke Patients Only)       Balance Overall balance assessment: Needs assistance   Sitting balance-Leahy Scale: Fair       Standing balance-Leahy Scale: Poor Standing balance comment: reliant on B UE and external support                            Cognition Arousal/Alertness: Awake/alert Behavior During Therapy: WFL for tasks assessed/performed Overall Cognitive Status: Within Functional Limits for tasks assessed                                        Exercises Other Exercises Other Exercises: warm up hip/knee flex/ext ROM with graded resistance in extension.    General Comments        Pertinent Vitals/Pain Pain Assessment: 0-10 Pain Score: 8  Pain Location: abdomen with mobility Pain Descriptors / Indicators: Grimacing;Guarding;Crying Pain Intervention(s): Patient requesting pain meds-RN notified;Repositioned    Home Living Family/patient expects to be discharged to:: Private residence Living Arrangements: Alone Available Help at Discharge: Friend(s);Available PRN/intermittently Type of Home: Apartment Home Access: Stairs to enter Entrance Stairs-Rails: None Home Layout: One level Home Equipment: Cane - single point      Prior Function Level of Independence: Independent with assistive device(s)      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, does not drive   PT Goals (current goals can now be found in the care plan section) Acute Rehab PT Goals Patient Stated Goal: get stronger, less pain PT Goal Formulation: With patient Time For Goal Achievement: 05/23/20 Potential to Achieve Goals: Fair Progress towards PT goals: Progressing toward goals    Frequency    Min 2X/week      PT Plan Current plan remains appropriate    Co-evaluation               AM-PAC PT "6 Clicks" Mobility   Outcome Measure  Help needed turning from your back to your side while in a flat bed without using bedrails?: A Lot Help needed moving from lying on your back to sitting on the side of a flat bed without using bedrails?: A Lot Help needed moving to and from a bed to a chair (including a wheelchair)?: A Lot Help needed standing up from a chair using your arms (e.g., wheelchair or bedside chair)?: A Lot Help needed to walk in hospital room?: A Lot Help needed climbing 3-5 steps with a railing? : Total 6 Click Score: 11    End of Session   Activity Tolerance: Patient limited by pain Patient left: in chair;with call bell/phone within reach;with chair alarm set Nurse Communication: Mobility status PT Visit Diagnosis: Unsteadiness on feet (R26.81);Other abnormalities of gait and mobility (R26.89);Muscle weakness (generalized) (M62.81)     Time: 4403-4742 PT Time Calculation (min) (ACUTE ONLY): 29 min  Charges:  $Gait Training: 8-22 mins                     05/08/2020  Maria Murphy., PT Acute Rehabilitation Services [phone]  (pager) [phone]  (office)   Maria Murphy Maria Murphy 05/08/2020, 6:29 PM

## 2020-05-09 ENCOUNTER — Inpatient Hospital Stay (HOSPITAL_COMMUNITY): Payer: Medicaid Other

## 2020-05-09 HISTORY — PX: IR CATHETER TUBE CHANGE: IMG717

## 2020-05-09 LAB — SURGICAL PCR SCREEN
MRSA, PCR: NEGATIVE
Staphylococcus aureus: NEGATIVE

## 2020-05-09 LAB — APTT
aPTT: 94 seconds — ABNORMAL HIGH (ref 24–36)
aPTT: 74 seconds — ABNORMAL HIGH (ref 24–36)

## 2020-05-09 LAB — CBC
HCT: 29.5 % — ABNORMAL LOW (ref 36.0–46.0)
Hemoglobin: 9.9 g/dL — ABNORMAL LOW (ref 12.0–15.0)
MCH: 29.1 pg (ref 26.0–34.0)
MCHC: 33.6 g/dL (ref 30.0–36.0)
MCV: 86.8 fL (ref 80.0–100.0)
Platelets: 211 10*3/uL (ref 150–400)
RBC: 3.4 MIL/uL — ABNORMAL LOW (ref 3.87–5.11)
RDW: 13.5 % (ref 11.5–15.5)
WBC: 6.8 10*3/uL (ref 4.0–10.5)
nRBC: 0 % (ref 0.0–0.2)

## 2020-05-09 LAB — HEPARIN LEVEL (UNFRACTIONATED): Heparin Unfractionated: 0.35 IU/mL (ref 0.30–0.70)

## 2020-05-09 MED ORDER — SODIUM CHLORIDE 0.9% FLUSH
10.0000 mL | Freq: Three times a day (TID) | INTRAVENOUS | Status: DC
  Administered 2020-05-09 – 2020-05-12 (×10): 10 mL

## 2020-05-09 MED ORDER — IOHEXOL 300 MG/ML  SOLN
50.0000 mL | Freq: Once | INTRAMUSCULAR | Status: AC | PRN
  Administered 2020-05-09: 15 mL

## 2020-05-09 MED ORDER — LIDOCAINE HCL 1 % IJ SOLN
INTRAMUSCULAR | Status: AC | PRN
  Administered 2020-05-09: 15 mL

## 2020-05-09 MED ORDER — LIDOCAINE HCL 1 % IJ SOLN
INTRAMUSCULAR | Status: AC
  Filled 2020-05-09: qty 20

## 2020-05-09 NOTE — Progress Notes (Addendum)
[facility] Progress Note  17 Days Post-Op  Subjective: Patient reports she has had 3 BM in the last 24 hrs. She is tolerating diet. She reports some sharp RLQ pain. LLQ drain was exchanged this AM.   Objective: Vital signs in last 24 hours: Temp:  [98.6 F (37 C)-99.4 F (37.4 C)] 98.8 F (37.1 C) (09/16 0800) Pulse Rate:  [74-88] 74 (09/16 0800) Resp:  [16-25] 18 (09/16 0800) BP: (135-154)/(79-89) 139/82 (09/16 0800) SpO2:  [91 %-99 %] 93 % (09/16 0800) Last BM Date: 05/08/20  Intake/Output from previous day: 09/15 0701 - 09/16 0700 In: 20 [I.V.:20] Out: 1300 [Urine:1300] Intake/Output this shift: No intake/output data recorded.  PE: General: pleasant, WD,obese female who issitting up in cha HEENT: Sclera are noninjected. PERRL. Ears and nose without any masses or lesions. Mouth is pink and moist Heart: regular, rate, and rhythm. Normal s1,s2. No obvious murmurs, gallops, or rubs noted. Palpable radial and pedal pulses bilaterally Lungs: CTAB, no wheezes, rhonchi, or rales noted. Respiratory effort nonlabored Abd: soft, mildly ttp in RLQ, ND, +BS,midline incision with small opening superiorly which is 4 cm in depth and does not seem to trach inferiorly or superiorly along incision, purulent drainage, LLQ drain just exchanged   Lab Results:  Recent Labs    05/08/20 2056 05/09/20 0745  WBC 7.4 6.8  HGB 10.8* 9.9*  HCT 32.4* 29.5*  PLT 231 211   BMET Recent Labs    05/07/20 0330 05/08/20 0243  NA 134* 136  K 4.2 3.6  CL 99 100  CO2 26 24  GLUCOSE 116* 125*  BUN 7* 6*  CREATININE 0.83 0.79  CALCIUM 8.6* 8.5*   PT/INR No results for input(s): LABPROT, INR in the last 72 hours. CMP     Component Value Date/Time   NA 136 05/08/2020 0243   K 3.6 05/08/2020 0243   CL 100 05/08/2020 0243   CO2 24 05/08/2020 0243   GLUCOSE 125 (H) 05/08/2020 0243   BUN 6 (L) 05/08/2020 0243   CREATININE 0.79 05/08/2020 0243   CALCIUM 8.5 (L)  05/08/2020 0243   PROT 6.7 05/07/2020 0330   ALBUMIN 2.4 (L) 05/07/2020 0330   AST 40 05/07/2020 0330   ALT 22 05/07/2020 0330   ALKPHOS 54 05/07/2020 0330   BILITOT 0.6 05/07/2020 0330   GFRNONAA >60 05/08/2020 0243   GFRAA >60 05/08/2020 0243   Lipase     Component Value Date/Time   LIPASE 33 04/21/2020 1835       Studies/Results: IR Catheter Tube Change  Result Date: 05/09/2020 INDICATION: 64 year old female with a history Graham patch for ruptured hollow viscus EXAM: IMAGE GUIDED REPLACEMENT/UPSIZE OF PERCUTANEOUS DRAIN MEDICATIONS: The patient is currently admitted to the hospital and receiving intravenous antibiotics. The antibiotics were administered within an appropriate time frame prior to the initiation of the procedure. ANESTHESIA/SEDATION: None COMPLICATIONS: None PROCEDURE: Informed written consent was obtained from the patient after a thorough discussion of the procedural risks, benefits and alternatives. All questions were addressed. Maximal Sterile Barrier Technique was utilized including caps, mask, sterile gowns, sterile gloves, sterile drape, hand hygiene and skin antiseptic. A timeout was performed prior to the initiation of the procedure. Patient is position supine position on the interventional II table. The drain was prepped and draped in the usual sterile fashion. 1% lidocaine was used for local anesthesia. Contrast was injected confirming location, and also identifying that the drain was partially blocked with significant resistance upon injection. Bentson wire was passed through  the drain and modified Seldinger technique was used to upsize the drain from [place] to a 14 [place] drain. Aspiration of significant volume of old blood products was performed. Drain was attached to gravity drainage.  Suture was placed. Patient had some discomfort during the procedure the remained hemodynamically stable. No complications. IMPRESSION: Routine exchange and up size of pelvic  drain with 2 a 14 French drain and evacuation of significant volume of old blood products. Signed, Dulcy Fanny. Dellia Nims, [facility] Radiology PLAN: Future drain changes should consider moderate sedation, given the patient's discomfort Electronically Signed   By: Corrie Mckusick D.O.   On: 05/09/2020 09:03   CT ASPIRATION  Result Date: 05/08/2020 INDICATION: 64 year old female with a history of Graham patch for perforated gastric ulcer, with new fluid collection in the sub a [place] space referred for aspiration EXAM: CT-GUIDED ASPIRATE OF UPPER ABDOMINAL FLUID COLLECTION MEDICATIONS: The patient is currently admitted to the hospital and receiving intravenous antibiotics. The antibiotics were administered within an appropriate time frame prior to the initiation of the procedure. ANESTHESIA/SEDATION: Fentanyl 1.0 mcg IV; Versed 50 mg IV Moderate Sedation Time:  10 minutes The patient was continuously monitored during the procedure by the interventional radiology nurse under my direct supervision. COMPLICATIONS: None PROCEDURE: Informed written consent was obtained from the patient after a thorough discussion of the procedural risks, benefits and alternatives. All questions were addressed. Maximal Sterile Barrier Technique was utilized including caps, mask, sterile gowns, sterile gloves, sterile drape, hand hygiene and skin antiseptic. A timeout was performed prior to the initiation of the procedure. Patient was positioned supine position on CT gantry table. Scout CT was acquired for planning purposes. Patient is prepped and draped in the usual sterile fashion. 1% lidocaine was used for local anesthesia. Using CT guidance, Yueh needle was advanced into the fluid collection of the subhepatic space. Aspirate of the gas and fluid collection was accomplished with approximately 3-4 cc of bloody fluid with some complexity. Culture was sent. Yueh catheter was removed and  a sterile bandage was placed. Patient tolerated the procedure well and remained hemodynamically stable throughout. No complications were encountered and no significant blood loss. IMPRESSION: Status post CT-guided aspirate of subhepatic fluid collection, with a culture sent with some complex bloody fluid. Signed, Dulcy Fanny. Dellia Nims, R[facility] Radiology Electronically Signed   By: Corrie Mckusick D.O.   On: 05/08/2020 07:08    Anti-infectives: Anti-infectives (From admission, onward)   Start     Dose/Rate Route Frequency Ordered Stop   05/07/20 1000  fluconazole (DIFLUCAN) IVPB 400 mg        400 mg 100 mL/hr over 120 Minutes Intravenous Every 24 hours 05/06/20 2353 05/14/20 0959   05/07/20 0000  piperacillin-tazobactam (ZOSYN) IVPB 3.375 g  Status:  Discontinued        3.375 g 100 mL/hr over 30 Minutes Intravenous Every 8 hours 05/06/20 2353 05/06/20 2355   05/07/20 0000  piperacillin-tazobactam (ZOSYN) IVPB 3.375 g        3.375 g 12.5 mL/hr over 240 Minutes Intravenous Every 8 hours 05/06/20 2356 05/13/20 2359   05/06/20 1600  piperacillin-tazobactam (ZOSYN) IVPB 3.375 g        3.375 g 100 mL/hr over 30 Minutes Intravenous  Once 05/06/20 0947 05/06/20 1708   04/26/20 1000  fluconazole (DIFLUCAN) IVPB 400 mg        400 mg 100 mL/hr over 120 Minutes Intravenous Every 24  hours 04/26/20 0907 05/06/20 2359   04/22/20 0100  piperacillin-tazobactam (ZOSYN) IVPB 3.375 g  Status:  Discontinued        3.375 g 12.5 mL/hr over 240 Minutes Intravenous Every 8 hours 04/22/20 0002 05/06/20 0946   04/22/20 0000  piperacillin-tazobactam (ZOSYN) IVPB 3.375 g  Status:  Discontinued        3.375 g 100 mL/hr over 30 Minutes Intravenous Every 8 hours 04/21/20 2349 04/22/20 0002       Assessment/Plan HTN Depression Acute PE - heparin gtt  S/P exploratory laparotomy and Graham patch for perforated gastric ulcer by Dr. Arnoldo Morale 8/30 Pelvic abscess  s/p IR drainage New RUQ collection around site of prior ulcer repair - POD#17 - concern for possible anastomotic leak although no contrast extrav noted on CT - s/p IR aspiration of fluid 9/14, cx pending  - if collection reaccumulates, that would be more suggestive of leak - daily iodoform packing to midline wound - having bowel function and tolerating soft diet - mobilize as able - recommend repeat CT with PO and IV contrast Sunday to evaluate if collection has reaccumulated, we will follow peripherally until repeat CT done  FEN: soft diet  VTE: heparin gtt ID: [email or web address]  Follow up: Dr. Arnoldo Morale  LOS: 38 days    Norm Parcel , [facility] 05/09/2020, 9:59 AM Please see Amion for pager number during day hours 7:00am-4:30pm

## 2020-05-09 NOTE — Progress Notes (Signed)
By: Eddie Candle M.D.   On: 04/22/2020 10:31   ECHOCARDIOGRAM COMPLETE  Result Date: 05/02/2020    ECHOCARDIOGRAM REPORT   Patient Name:   Maria Murphy Date of Exam: 05/02/2020 Medical Rec #:   [id number]      Height:       64.0 in Accession #:    [id number]     Weight:       224.9 lb Date of Birth:  12/01/1955      BSA:          2.056 m Patient Age:    64 years       BP:           143/79 mmHg Patient Gender: F              HR:           71 bpm. Exam Location:  Inpatient Procedure: 2D Echo, Cardiac Doppler and Color Doppler Indications:    Pulmonary Embolus  History:        Patient has no prior history of Echocardiogram examinations.                 Risk Factors:Hypertension and Obesity. Pulmonary embolus, s/p GI                 surgery.  Sonographer:    Dustin Flock Referring Phys: [id number] [place]  1. Left ventricular ejection fraction, by estimation, is 60 to 65%. The left ventricle has normal function. The left ventricle has no regional wall motion abnormalities. Left ventricular diastolic parameters were normal. There is the interventricular septum is flattened in systole, consistent with right ventricular pressure overload.  2. Right ventricular systolic function is normal. The right ventricular size is mildly enlarged. There is moderately elevated pulmonary artery systolic pressure. The estimated right ventricular systolic pressure is 86.7 mmHg.  3. The mitral valve is normal in structure. Trivial mitral valve regurgitation. No evidence of mitral stenosis.  4. The aortic valve is tricuspid. Aortic valve regurgitation is not visualized. No aortic stenosis is present. Comparison(s): No prior Echocardiogram. FINDINGS  Left Ventricle: Left ventricular ejection fraction, by estimation, is 60 to 65%. The left ventricle has normal function. The left ventricle has no regional wall motion abnormalities. The left ventricular internal cavity size was normal in size. There is  no left ventricular hypertrophy. The interventricular septum is flattened in systole, consistent with right ventricular pressure overload. Left ventricular diastolic parameters were normal. Right Ventricle: The right  ventricular size is mildly enlarged. No increase in right ventricular wall thickness. Right ventricular systolic function is normal. There is moderately elevated pulmonary artery systolic pressure. The tricuspid regurgitant velocity is 3.28 m/s, and with an assumed right atrial pressure of 8 mmHg, the estimated right ventricular systolic pressure is 67.2 mmHg. Left Atrium: Left atrial size was normal in size. Right Atrium: Right atrial size was normal in size. Pericardium: There is no evidence of pericardial effusion. Mitral Valve: The mitral valve is normal in structure. Trivial mitral valve regurgitation. No evidence of mitral valve stenosis. Tricuspid Valve: The tricuspid valve is normal in structure. Tricuspid valve regurgitation is trivial. No evidence of tricuspid stenosis. Aortic Valve: The aortic valve is tricuspid. Aortic valve regurgitation is not visualized. No aortic stenosis is present. Pulmonic Valve: The pulmonic valve was not well visualized. Pulmonic valve regurgitation is not visualized. Aorta: The aortic root and ascending aorta are structurally normal, with no evidence of dilitation. Venous: The inferior vena cava  By: Eddie Candle M.D.   On: 04/22/2020 10:31   ECHOCARDIOGRAM COMPLETE  Result Date: 05/02/2020    ECHOCARDIOGRAM REPORT   Patient Name:   Maria Murphy Date of Exam: 05/02/2020 Medical Rec #:   [id number]      Height:       64.0 in Accession #:    [id number]     Weight:       224.9 lb Date of Birth:  12/01/1955      BSA:          2.056 m Patient Age:    64 years       BP:           143/79 mmHg Patient Gender: F              HR:           71 bpm. Exam Location:  Inpatient Procedure: 2D Echo, Cardiac Doppler and Color Doppler Indications:    Pulmonary Embolus  History:        Patient has no prior history of Echocardiogram examinations.                 Risk Factors:Hypertension and Obesity. Pulmonary embolus, s/p GI                 surgery.  Sonographer:    Dustin Flock Referring Phys: [id number] [place]  1. Left ventricular ejection fraction, by estimation, is 60 to 65%. The left ventricle has normal function. The left ventricle has no regional wall motion abnormalities. Left ventricular diastolic parameters were normal. There is the interventricular septum is flattened in systole, consistent with right ventricular pressure overload.  2. Right ventricular systolic function is normal. The right ventricular size is mildly enlarged. There is moderately elevated pulmonary artery systolic pressure. The estimated right ventricular systolic pressure is 86.7 mmHg.  3. The mitral valve is normal in structure. Trivial mitral valve regurgitation. No evidence of mitral stenosis.  4. The aortic valve is tricuspid. Aortic valve regurgitation is not visualized. No aortic stenosis is present. Comparison(s): No prior Echocardiogram. FINDINGS  Left Ventricle: Left ventricular ejection fraction, by estimation, is 60 to 65%. The left ventricle has normal function. The left ventricle has no regional wall motion abnormalities. The left ventricular internal cavity size was normal in size. There is  no left ventricular hypertrophy. The interventricular septum is flattened in systole, consistent with right ventricular pressure overload. Left ventricular diastolic parameters were normal. Right Ventricle: The right  ventricular size is mildly enlarged. No increase in right ventricular wall thickness. Right ventricular systolic function is normal. There is moderately elevated pulmonary artery systolic pressure. The tricuspid regurgitant velocity is 3.28 m/s, and with an assumed right atrial pressure of 8 mmHg, the estimated right ventricular systolic pressure is 67.2 mmHg. Left Atrium: Left atrial size was normal in size. Right Atrium: Right atrial size was normal in size. Pericardium: There is no evidence of pericardial effusion. Mitral Valve: The mitral valve is normal in structure. Trivial mitral valve regurgitation. No evidence of mitral valve stenosis. Tricuspid Valve: The tricuspid valve is normal in structure. Tricuspid valve regurgitation is trivial. No evidence of tricuspid stenosis. Aortic Valve: The aortic valve is tricuspid. Aortic valve regurgitation is not visualized. No aortic stenosis is present. Pulmonic Valve: The pulmonic valve was not well visualized. Pulmonic valve regurgitation is not visualized. Aorta: The aortic root and ascending aorta are structurally normal, with no evidence of dilitation. Venous: The inferior vena cava  [facility]                                                                              Patient Demographics  Kylena Mole, is a 64 y.o. female, DOB - [birth year], GNF:[id number]  Admit date - 04/21/2020   Admitting Physician Lequita Halt, MD  Outpatient Primary MD for the patient is Rosita Fire, MD  Outpatient specialists:   LOS - 17  days   Medical records reviewed and are as summarized below:    Chief Complaint  Patient presents with  . Abdominal Pain       Brief summary   64 year old female, hospitalizedto [facility] on[facility],with the working diagnosis biliary pancreatitis,chronic cholecystitis/cholelithiasis. Patient underwent further work-up with MRCP, which showed free fluid in the abdomen and pneumoperitoneum. Patient was placed on broad-spectrum antibiotic therapy and vasopressors, she underwent emergent laparotomy. Patient was found to have a perforation in the prepyloric area along the lesser curvature of the stomach and antrum, and a Phillip Heal application was performed. Surgical culture positive for Enterococcus and Candida. Patient was stabilized with antibiotic therapy,butshe developed worsening leukocytosis.  A CT of the abdomen pelvis on 9/7 showed a new 5.3 x 14 x 3.9 cm abscess in the pelvis,5.1 x 2.6 x 2.0 subcapsular fluid collection along the right inferior liver, also concerning for abscess. CT of the chest with multifocal pulmonary embolus, pulmonary emboli arising in the saddledistribution from each main pulmonary artery with extension into multiple lower lobe point arteries bilaterally. Positive right heart strain,consistent with submassive. She underwent 12 F drain placement into the low abdominopelvic fluid collection. Culture positive for enterococcus and candida. Patient has been placed on heparin drip. Echo with preserved RV function  Follow up CT abdomen and pelvis (09/13), after completing 2 weeks of antibiotic therapy with  development of 3.4x1.8x.19 right upper abdominal gas and fluid collection in the area of prior pre-pyloric gastric ulcer perforation. New subhepatic abscess, with cutaneous fistula General surgery, IR reconsulted    Assessment & Plan    Principal Problem: Acute submassive pulmonary embolism/saddle embolism with multiple bilateral embolus/acute hypoxic respiratory failure -2D echo with preserved LV and RV function. -Patient was placed on IV heparin drip, subsequently transitioned to [place] held due to new abdominal collection/abscess and patient was changed back to heparin drip  -Continue heparin drip per pharmacy protocol, until interventions completed and safe to resume per surgery    Active Problems: Perforated gastric ulcer status post ex lap and Graham patch by Dr. Arnoldo Morale 8/30 Pelvic abscess status post IR drainage, cultures Enterococcus, Candida -General surgery and IR following, status post IR aspiration of the fluid 9/14, cultures pending -Per surgery, if collection reaccumulate, would be more suggestive of leak, remains high risk for peritonitis. Repeat CT with PO and IV contrast on Sunday.  -Continue dressing change, mobilize, underwent exchange of abdominal drain today per IR  -Continue Zosyn, fluconazole -Continue IV PPI   Essential hypertension BP currently stable  History of depression Currently stable, as needed lorazepam  Hypokalemia -Resolved  Constipation -Had 3 BMs in the last 24 hours, will hold Colace and Dulcolax   Obesity Estimated body mass index  [facility]                                                                              Patient Demographics  Kylena Mole, is a 64 y.o. female, DOB - [birth year], GNF:[id number]  Admit date - 04/21/2020   Admitting Physician Lequita Halt, MD  Outpatient Primary MD for the patient is Rosita Fire, MD  Outpatient specialists:   LOS - 17  days   Medical records reviewed and are as summarized below:    Chief Complaint  Patient presents with  . Abdominal Pain       Brief summary   64 year old female, hospitalized[facility] on[facility],with the working diagnosis biliary pancreatitis,chronic cholecystitis/cholelithiasis. Patient underwent further work-up with MRCP, which showed free fluid in the abdomen and pneumoperitoneum. Patient was placed on broad-spectrum antibiotic therapy and vasopressors, she underwent emergent laparotomy. Patient was found to have a perforation in the prepyloric area along the lesser curvature of the stomach and antrum, and a Phillip Heal application was performed. Surgical culture positive for Enterococcus and Candida. Patient was stabilized with antibiotic therapy,butshe developed worsening leukocytosis.  A CT of the abdomen pelvis on 9/7 showed a new 5.3 x 14 x 3.9 cm abscess in the pelvis,5.1 x 2.6 x 2.0 subcapsular fluid collection along the right inferior liver, also concerning for abscess. CT of the chest with multifocal pulmonary embolus, pulmonary emboli arising in the saddledistribution from each main pulmonary artery with extension into multiple lower lobe point arteries bilaterally. Positive right heart strain,consistent with submassive. She underwent 12 F drain placement into the low abdominopelvic fluid collection. Culture positive for enterococcus and candida. Patient has been placed on heparin drip. Echo with preserved RV function  Follow up CT abdomen and pelvis (09/13), after completing 2 weeks of antibiotic therapy with  development of 3.4x1.8x.19 right upper abdominal gas and fluid collection in the area of prior pre-pyloric gastric ulcer perforation. New subhepatic abscess, with cutaneous fistula General surgery, IR reconsulted    Assessment & Plan    Principal Problem: Acute submassive pulmonary embolism/saddle embolism with multiple bilateral embolus/acute hypoxic respiratory failure -2D echo with preserved LV and RV function. -Patient was placed on IV heparin drip, subsequently transitioned to [place] held due to new abdominal collection/abscess and patient was changed back to heparin drip  -Continue heparin drip per pharmacy protocol, until interventions completed and safe to resume per surgery    Active Problems: Perforated gastric ulcer status post ex lap and Graham patch by Dr. Arnoldo Morale 8/30 Pelvic abscess status post IR drainage, cultures Enterococcus, Candida -General surgery and IR following, status post IR aspiration of the fluid 9/14, cultures pending -Per surgery, if collection reaccumulate, would be more suggestive of leak, remains high risk for peritonitis. Repeat CT with PO and IV contrast on Sunday.  -Continue dressing change, mobilize, underwent exchange of abdominal drain today per IR  -Continue Zosyn, fluconazole -Continue IV PPI   Essential hypertension BP currently stable  History of depression Currently stable, as needed lorazepam  Hypokalemia -Resolved  Constipation -Had 3 BMs in the last 24 hours, will hold Colace and Dulcolax   Obesity Estimated body mass index  [facility]                                                                              Patient Demographics  Kylena Mole, is a 64 y.o. female, DOB - [birth year], GNF:[id number]  Admit date - 04/21/2020   Admitting Physician Lequita Halt, MD  Outpatient Primary MD for the patient is Rosita Fire, MD  Outpatient specialists:   LOS - 17  days   Medical records reviewed and are as summarized below:    Chief Complaint  Patient presents with  . Abdominal Pain       Brief summary   64 year old female, hospitalized[facility],with the working diagnosis biliary pancreatitis,chronic cholecystitis/cholelithiasis. Patient underwent further work-up with MRCP, which showed free fluid in the abdomen and pneumoperitoneum. Patient was placed on broad-spectrum antibiotic therapy and vasopressors, she underwent emergent laparotomy. Patient was found to have a perforation in the prepyloric area along the lesser curvature of the stomach and antrum, and a Phillip Heal application was performed. Surgical culture positive for Enterococcus and Candida. Patient was stabilized with antibiotic therapy,butshe developed worsening leukocytosis.  A CT of the abdomen pelvis on 9/7 showed a new 5.3 x 14 x 3.9 cm abscess in the pelvis,5.1 x 2.6 x 2.0 subcapsular fluid collection along the right inferior liver, also concerning for abscess. CT of the chest with multifocal pulmonary embolus, pulmonary emboli arising in the saddledistribution from each main pulmonary artery with extension into multiple lower lobe point arteries bilaterally. Positive right heart strain,consistent with submassive. She underwent 12 F drain placement into the low abdominopelvic fluid collection. Culture positive for enterococcus and candida. Patient has been placed on heparin drip. Echo with preserved RV function  Follow up CT abdomen and pelvis (09/13), after completing 2 weeks of antibiotic therapy with  development of 3.4x1.8x.19 right upper abdominal gas and fluid collection in the area of prior pre-pyloric gastric ulcer perforation. New subhepatic abscess, with cutaneous fistula General surgery, IR reconsulted    Assessment & Plan    Principal Problem: Acute submassive pulmonary embolism/saddle embolism with multiple bilateral embolus/acute hypoxic respiratory failure -2D echo with preserved LV and RV function. -Patient was placed on IV heparin drip, subsequently transitioned to [place] held due to new abdominal collection/abscess and patient was changed back to heparin drip  -Continue heparin drip per pharmacy protocol, until interventions completed and safe to resume per surgery    Active Problems: Perforated gastric ulcer status post ex lap and Graham patch by Dr. Arnoldo Morale 8/30 Pelvic abscess status post IR drainage, cultures Enterococcus, Candida -General surgery and IR following, status post IR aspiration of the fluid 9/14, cultures pending -Per surgery, if collection reaccumulate, would be more suggestive of leak, remains high risk for peritonitis. Repeat CT with PO and IV contrast on Sunday.  -Continue dressing change, mobilize, underwent exchange of abdominal drain today per IR  -Continue Zosyn, fluconazole -Continue IV PPI   Essential hypertension BP currently stable  History of depression Currently stable, as needed lorazepam  Hypokalemia -Resolved  Constipation -Had 3 BMs in the last 24 hours, will hold Colace and Dulcolax   Obesity Estimated body mass index  By: Eddie Candle M.D.   On: 04/22/2020 10:31   ECHOCARDIOGRAM COMPLETE  Result Date: 05/02/2020    ECHOCARDIOGRAM REPORT   Patient Name:   Maria Murphy Date of Exam: 05/02/2020 Medical Rec #:   [id number]      Height:       64.0 in Accession #:    [id number]     Weight:       224.9 lb Date of Birth:  12/01/1955      BSA:          2.056 m Patient Age:    64 years       BP:           143/79 mmHg Patient Gender: F              HR:           71 bpm. Exam Location:  Inpatient Procedure: 2D Echo, Cardiac Doppler and Color Doppler Indications:    Pulmonary Embolus  History:        Patient has no prior history of Echocardiogram examinations.                 Risk Factors:Hypertension and Obesity. Pulmonary embolus, s/p GI                 surgery.  Sonographer:    Dustin Flock Referring Phys: [id number] [place]  1. Left ventricular ejection fraction, by estimation, is 60 to 65%. The left ventricle has normal function. The left ventricle has no regional wall motion abnormalities. Left ventricular diastolic parameters were normal. There is the interventricular septum is flattened in systole, consistent with right ventricular pressure overload.  2. Right ventricular systolic function is normal. The right ventricular size is mildly enlarged. There is moderately elevated pulmonary artery systolic pressure. The estimated right ventricular systolic pressure is 86.7 mmHg.  3. The mitral valve is normal in structure. Trivial mitral valve regurgitation. No evidence of mitral stenosis.  4. The aortic valve is tricuspid. Aortic valve regurgitation is not visualized. No aortic stenosis is present. Comparison(s): No prior Echocardiogram. FINDINGS  Left Ventricle: Left ventricular ejection fraction, by estimation, is 60 to 65%. The left ventricle has normal function. The left ventricle has no regional wall motion abnormalities. The left ventricular internal cavity size was normal in size. There is  no left ventricular hypertrophy. The interventricular septum is flattened in systole, consistent with right ventricular pressure overload. Left ventricular diastolic parameters were normal. Right Ventricle: The right  ventricular size is mildly enlarged. No increase in right ventricular wall thickness. Right ventricular systolic function is normal. There is moderately elevated pulmonary artery systolic pressure. The tricuspid regurgitant velocity is 3.28 m/s, and with an assumed right atrial pressure of 8 mmHg, the estimated right ventricular systolic pressure is 67.2 mmHg. Left Atrium: Left atrial size was normal in size. Right Atrium: Right atrial size was normal in size. Pericardium: There is no evidence of pericardial effusion. Mitral Valve: The mitral valve is normal in structure. Trivial mitral valve regurgitation. No evidence of mitral valve stenosis. Tricuspid Valve: The tricuspid valve is normal in structure. Tricuspid valve regurgitation is trivial. No evidence of tricuspid stenosis. Aortic Valve: The aortic valve is tricuspid. Aortic valve regurgitation is not visualized. No aortic stenosis is present. Pulmonic Valve: The pulmonic valve was not well visualized. Pulmonic valve regurgitation is not visualized. Aorta: The aortic root and ascending aorta are structurally normal, with no evidence of dilitation. Venous: The inferior vena cava  [facility]                                                                              Patient Demographics  Kylena Mole, is a 64 y.o. female, DOB - [birth year], GNF:[id number]  Admit date - 04/21/2020   Admitting Physician Lequita Halt, MD  Outpatient Primary MD for the patient is Rosita Fire, MD  Outpatient specialists:   LOS - 17  days   Medical records reviewed and are as summarized below:    Chief Complaint  Patient presents with  . Abdominal Pain       Brief summary   64 year old female, hospitalized[facility],with the working diagnosis biliary pancreatitis,chronic cholecystitis/cholelithiasis. Patient underwent further work-up with MRCP, which showed free fluid in the abdomen and pneumoperitoneum. Patient was placed on broad-spectrum antibiotic therapy and vasopressors, she underwent emergent laparotomy. Patient was found to have a perforation in the prepyloric area along the lesser curvature of the stomach and antrum, and a Phillip Heal application was performed. Surgical culture positive for Enterococcus and Candida. Patient was stabilized with antibiotic therapy,butshe developed worsening leukocytosis.  A CT of the abdomen pelvis on 9/7 showed a new 5.3 x 14 x 3.9 cm abscess in the pelvis,5.1 x 2.6 x 2.0 subcapsular fluid collection along the right inferior liver, also concerning for abscess. CT of the chest with multifocal pulmonary embolus, pulmonary emboli arising in the saddledistribution from each main pulmonary artery with extension into multiple lower lobe point arteries bilaterally. Positive right heart strain,consistent with submassive. She underwent 12 F drain placement into the low abdominopelvic fluid collection. Culture positive for enterococcus and candida. Patient has been placed on heparin drip. Echo with preserved RV function  Follow up CT abdomen and pelvis (09/13), after completing 2 weeks of antibiotic therapy with  development of 3.4x1.8x.19 right upper abdominal gas and fluid collection in the area of prior pre-pyloric gastric ulcer perforation. New subhepatic abscess, with cutaneous fistula General surgery, IR reconsulted    Assessment & Plan    Principal Problem: Acute submassive pulmonary embolism/saddle embolism with multiple bilateral embolus/acute hypoxic respiratory failure -2D echo with preserved LV and RV function. -Patient was placed on IV heparin drip, subsequently transitioned to [place] held due to new abdominal collection/abscess and patient was changed back to heparin drip  -Continue heparin drip per pharmacy protocol, until interventions completed and safe to resume per surgery    Active Problems: Perforated gastric ulcer status post ex lap and Graham patch by Dr. Arnoldo Morale 8/30 Pelvic abscess status post IR drainage, cultures Enterococcus, Candida -General surgery and IR following, status post IR aspiration of the fluid 9/14, cultures pending -Per surgery, if collection reaccumulate, would be more suggestive of leak, remains high risk for peritonitis. Repeat CT with PO and IV contrast on Sunday.  -Continue dressing change, mobilize, underwent exchange of abdominal drain today per IR  -Continue Zosyn, fluconazole -Continue IV PPI   Essential hypertension BP currently stable  History of depression Currently stable, as needed lorazepam  Hypokalemia -Resolved  Constipation -Had 3 BMs in the last 24 hours, will hold Colace and Dulcolax   Obesity Estimated body mass index  By: Eddie Candle M.D.   On: 04/22/2020 10:31   ECHOCARDIOGRAM COMPLETE  Result Date: 05/02/2020    ECHOCARDIOGRAM REPORT   Patient Name:   Maria Murphy Date of Exam: 05/02/2020 Medical Rec #:   [id number]      Height:       64.0 in Accession #:    [id number]     Weight:       224.9 lb Date of Birth:  12/01/1955      BSA:          2.056 m Patient Age:    64 years       BP:           143/79 mmHg Patient Gender: F              HR:           71 bpm. Exam Location:  Inpatient Procedure: 2D Echo, Cardiac Doppler and Color Doppler Indications:    Pulmonary Embolus  History:        Patient has no prior history of Echocardiogram examinations.                 Risk Factors:Hypertension and Obesity. Pulmonary embolus, s/p GI                 surgery.  Sonographer:    Dustin Flock Referring Phys: [id number] [place]  1. Left ventricular ejection fraction, by estimation, is 60 to 65%. The left ventricle has normal function. The left ventricle has no regional wall motion abnormalities. Left ventricular diastolic parameters were normal. There is the interventricular septum is flattened in systole, consistent with right ventricular pressure overload.  2. Right ventricular systolic function is normal. The right ventricular size is mildly enlarged. There is moderately elevated pulmonary artery systolic pressure. The estimated right ventricular systolic pressure is 86.7 mmHg.  3. The mitral valve is normal in structure. Trivial mitral valve regurgitation. No evidence of mitral stenosis.  4. The aortic valve is tricuspid. Aortic valve regurgitation is not visualized. No aortic stenosis is present. Comparison(s): No prior Echocardiogram. FINDINGS  Left Ventricle: Left ventricular ejection fraction, by estimation, is 60 to 65%. The left ventricle has normal function. The left ventricle has no regional wall motion abnormalities. The left ventricular internal cavity size was normal in size. There is  no left ventricular hypertrophy. The interventricular septum is flattened in systole, consistent with right ventricular pressure overload. Left ventricular diastolic parameters were normal. Right Ventricle: The right  ventricular size is mildly enlarged. No increase in right ventricular wall thickness. Right ventricular systolic function is normal. There is moderately elevated pulmonary artery systolic pressure. The tricuspid regurgitant velocity is 3.28 m/s, and with an assumed right atrial pressure of 8 mmHg, the estimated right ventricular systolic pressure is 67.2 mmHg. Left Atrium: Left atrial size was normal in size. Right Atrium: Right atrial size was normal in size. Pericardium: There is no evidence of pericardial effusion. Mitral Valve: The mitral valve is normal in structure. Trivial mitral valve regurgitation. No evidence of mitral valve stenosis. Tricuspid Valve: The tricuspid valve is normal in structure. Tricuspid valve regurgitation is trivial. No evidence of tricuspid stenosis. Aortic Valve: The aortic valve is tricuspid. Aortic valve regurgitation is not visualized. No aortic stenosis is present. Pulmonic Valve: The pulmonic valve was not well visualized. Pulmonic valve regurgitation is not visualized. Aorta: The aortic root and ascending aorta are structurally normal, with no evidence of dilitation. Venous: The inferior vena cava  By: Eddie Candle M.D.   On: 04/22/2020 10:31   ECHOCARDIOGRAM COMPLETE  Result Date: 05/02/2020    ECHOCARDIOGRAM REPORT   Patient Name:   Maria Murphy Date of Exam: 05/02/2020 Medical Rec #:   [id number]      Height:       64.0 in Accession #:    [id number]     Weight:       224.9 lb Date of Birth:  12/01/1955      BSA:          2.056 m Patient Age:    64 years       BP:           143/79 mmHg Patient Gender: F              HR:           71 bpm. Exam Location:  Inpatient Procedure: 2D Echo, Cardiac Doppler and Color Doppler Indications:    Pulmonary Embolus  History:        Patient has no prior history of Echocardiogram examinations.                 Risk Factors:Hypertension and Obesity. Pulmonary embolus, s/p GI                 surgery.  Sonographer:    Dustin Flock Referring Phys: [id number] [place]  1. Left ventricular ejection fraction, by estimation, is 60 to 65%. The left ventricle has normal function. The left ventricle has no regional wall motion abnormalities. Left ventricular diastolic parameters were normal. There is the interventricular septum is flattened in systole, consistent with right ventricular pressure overload.  2. Right ventricular systolic function is normal. The right ventricular size is mildly enlarged. There is moderately elevated pulmonary artery systolic pressure. The estimated right ventricular systolic pressure is 86.7 mmHg.  3. The mitral valve is normal in structure. Trivial mitral valve regurgitation. No evidence of mitral stenosis.  4. The aortic valve is tricuspid. Aortic valve regurgitation is not visualized. No aortic stenosis is present. Comparison(s): No prior Echocardiogram. FINDINGS  Left Ventricle: Left ventricular ejection fraction, by estimation, is 60 to 65%. The left ventricle has normal function. The left ventricle has no regional wall motion abnormalities. The left ventricular internal cavity size was normal in size. There is  no left ventricular hypertrophy. The interventricular septum is flattened in systole, consistent with right ventricular pressure overload. Left ventricular diastolic parameters were normal. Right Ventricle: The right  ventricular size is mildly enlarged. No increase in right ventricular wall thickness. Right ventricular systolic function is normal. There is moderately elevated pulmonary artery systolic pressure. The tricuspid regurgitant velocity is 3.28 m/s, and with an assumed right atrial pressure of 8 mmHg, the estimated right ventricular systolic pressure is 67.2 mmHg. Left Atrium: Left atrial size was normal in size. Right Atrium: Right atrial size was normal in size. Pericardium: There is no evidence of pericardial effusion. Mitral Valve: The mitral valve is normal in structure. Trivial mitral valve regurgitation. No evidence of mitral valve stenosis. Tricuspid Valve: The tricuspid valve is normal in structure. Tricuspid valve regurgitation is trivial. No evidence of tricuspid stenosis. Aortic Valve: The aortic valve is tricuspid. Aortic valve regurgitation is not visualized. No aortic stenosis is present. Pulmonic Valve: The pulmonic valve was not well visualized. Pulmonic valve regurgitation is not visualized. Aorta: The aortic root and ascending aorta are structurally normal, with no evidence of dilitation. Venous: The inferior vena cava  By: Eddie Candle M.D.   On: 04/22/2020 10:31   ECHOCARDIOGRAM COMPLETE  Result Date: 05/02/2020    ECHOCARDIOGRAM REPORT   Patient Name:   Maria Murphy Date of Exam: 05/02/2020 Medical Rec #:   [id number]      Height:       64.0 in Accession #:    [id number]     Weight:       224.9 lb Date of Birth:  12/01/1955      BSA:          2.056 m Patient Age:    64 years       BP:           143/79 mmHg Patient Gender: F              HR:           71 bpm. Exam Location:  Inpatient Procedure: 2D Echo, Cardiac Doppler and Color Doppler Indications:    Pulmonary Embolus  History:        Patient has no prior history of Echocardiogram examinations.                 Risk Factors:Hypertension and Obesity. Pulmonary embolus, s/p GI                 surgery.  Sonographer:    Dustin Flock Referring Phys: [id number] [place]  1. Left ventricular ejection fraction, by estimation, is 60 to 65%. The left ventricle has normal function. The left ventricle has no regional wall motion abnormalities. Left ventricular diastolic parameters were normal. There is the interventricular septum is flattened in systole, consistent with right ventricular pressure overload.  2. Right ventricular systolic function is normal. The right ventricular size is mildly enlarged. There is moderately elevated pulmonary artery systolic pressure. The estimated right ventricular systolic pressure is 86.7 mmHg.  3. The mitral valve is normal in structure. Trivial mitral valve regurgitation. No evidence of mitral stenosis.  4. The aortic valve is tricuspid. Aortic valve regurgitation is not visualized. No aortic stenosis is present. Comparison(s): No prior Echocardiogram. FINDINGS  Left Ventricle: Left ventricular ejection fraction, by estimation, is 60 to 65%. The left ventricle has normal function. The left ventricle has no regional wall motion abnormalities. The left ventricular internal cavity size was normal in size. There is  no left ventricular hypertrophy. The interventricular septum is flattened in systole, consistent with right ventricular pressure overload. Left ventricular diastolic parameters were normal. Right Ventricle: The right  ventricular size is mildly enlarged. No increase in right ventricular wall thickness. Right ventricular systolic function is normal. There is moderately elevated pulmonary artery systolic pressure. The tricuspid regurgitant velocity is 3.28 m/s, and with an assumed right atrial pressure of 8 mmHg, the estimated right ventricular systolic pressure is 67.2 mmHg. Left Atrium: Left atrial size was normal in size. Right Atrium: Right atrial size was normal in size. Pericardium: There is no evidence of pericardial effusion. Mitral Valve: The mitral valve is normal in structure. Trivial mitral valve regurgitation. No evidence of mitral valve stenosis. Tricuspid Valve: The tricuspid valve is normal in structure. Tricuspid valve regurgitation is trivial. No evidence of tricuspid stenosis. Aortic Valve: The aortic valve is tricuspid. Aortic valve regurgitation is not visualized. No aortic stenosis is present. Pulmonic Valve: The pulmonic valve was not well visualized. Pulmonic valve regurgitation is not visualized. Aorta: The aortic root and ascending aorta are structurally normal, with no evidence of dilitation. Venous: The inferior vena cava  [facility]                                                                              Patient Demographics  Kylena Mole, is a 64 y.o. female, DOB - [birth year], GNF:[id number]  Admit date - 04/21/2020   Admitting Physician Lequita Halt, MD  Outpatient Primary MD for the patient is Rosita Fire, MD  Outpatient specialists:   LOS - 17  days   Medical records reviewed and are as summarized below:    Chief Complaint  Patient presents with  . Abdominal Pain       Brief summary   64 year old female, hospitalizedto [facility] on[facility],with the working diagnosis biliary pancreatitis,chronic cholecystitis/cholelithiasis. Patient underwent further work-up with MRCP, which showed free fluid in the abdomen and pneumoperitoneum. Patient was placed on broad-spectrum antibiotic therapy and vasopressors, she underwent emergent laparotomy. Patient was found to have a perforation in the prepyloric area along the lesser curvature of the stomach and antrum, and a Phillip Heal application was performed. Surgical culture positive for Enterococcus and Candida. Patient was stabilized with antibiotic therapy,butshe developed worsening leukocytosis.  A CT of the abdomen pelvis on 9/7 showed a new 5.3 x 14 x 3.9 cm abscess in the pelvis,5.1 x 2.6 x 2.0 subcapsular fluid collection along the right inferior liver, also concerning for abscess. CT of the chest with multifocal pulmonary embolus, pulmonary emboli arising in the saddledistribution from each main pulmonary artery with extension into multiple lower lobe point arteries bilaterally. Positive right heart strain,consistent with submassive. She underwent 12 F drain placement into the low abdominopelvic fluid collection. Culture positive for enterococcus and candida. Patient has been placed on heparin drip. Echo with preserved RV function  Follow up CT abdomen and pelvis (09/13), after completing 2 weeks of antibiotic therapy with  development of 3.4x1.8x.19 right upper abdominal gas and fluid collection in the area of prior pre-pyloric gastric ulcer perforation. New subhepatic abscess, with cutaneous fistula General surgery, IR reconsulted    Assessment & Plan    Principal Problem: Acute submassive pulmonary embolism/saddle embolism with multiple bilateral embolus/acute hypoxic respiratory failure -2D echo with preserved LV and RV function. -Patient was placed on IV heparin drip, subsequently transitioned to [place] held due to new abdominal collection/abscess and patient was changed back to heparin drip  -Continue heparin drip per pharmacy protocol, until interventions completed and safe to resume per surgery    Active Problems: Perforated gastric ulcer status post ex lap and Graham patch by Dr. Arnoldo Morale 8/30 Pelvic abscess status post IR drainage, cultures Enterococcus, Candida -General surgery and IR following, status post IR aspiration of the fluid 9/14, cultures pending -Per surgery, if collection reaccumulate, would be more suggestive of leak, remains high risk for peritonitis. Repeat CT with PO and IV contrast on Sunday.  -Continue dressing change, mobilize, underwent exchange of abdominal drain today per IR  -Continue Zosyn, fluconazole -Continue IV PPI   Essential hypertension BP currently stable  History of depression Currently stable, as needed lorazepam  Hypokalemia -Resolved  Constipation -Had 3 BMs in the last 24 hours, will hold Colace and Dulcolax   Obesity Estimated body mass index  [facility]                                                                              Patient Demographics  Kylena Mole, is a 64 y.o. female, DOB - [birth year], GNF:[id number]  Admit date - 04/21/2020   Admitting Physician Lequita Halt, MD  Outpatient Primary MD for the patient is Rosita Fire, MD  Outpatient specialists:   LOS - 17  days   Medical records reviewed and are as summarized below:    Chief Complaint  Patient presents with  . Abdominal Pain       Brief summary   64 year old female, hospitalized[facility],with the working diagnosis biliary pancreatitis,chronic cholecystitis/cholelithiasis. Patient underwent further work-up with MRCP, which showed free fluid in the abdomen and pneumoperitoneum. Patient was placed on broad-spectrum antibiotic therapy and vasopressors, she underwent emergent laparotomy. Patient was found to have a perforation in the prepyloric area along the lesser curvature of the stomach and antrum, and a Phillip Heal application was performed. Surgical culture positive for Enterococcus and Candida. Patient was stabilized with antibiotic therapy,butshe developed worsening leukocytosis.  A CT of the abdomen pelvis on 9/7 showed a new 5.3 x 14 x 3.9 cm abscess in the pelvis,5.1 x 2.6 x 2.0 subcapsular fluid collection along the right inferior liver, also concerning for abscess. CT of the chest with multifocal pulmonary embolus, pulmonary emboli arising in the saddledistribution from each main pulmonary artery with extension into multiple lower lobe point arteries bilaterally. Positive right heart strain,consistent with submassive. She underwent 12 F drain placement into the low abdominopelvic fluid collection. Culture positive for enterococcus and candida. Patient has been placed on heparin drip. Echo with preserved RV function  Follow up CT abdomen and pelvis (09/13), after completing 2 weeks of antibiotic therapy with  development of 3.4x1.8x.19 right upper abdominal gas and fluid collection in the area of prior pre-pyloric gastric ulcer perforation. New subhepatic abscess, with cutaneous fistula General surgery, IR reconsulted    Assessment & Plan    Principal Problem: Acute submassive pulmonary embolism/saddle embolism with multiple bilateral embolus/acute hypoxic respiratory failure -2D echo with preserved LV and RV function. -Patient was placed on IV heparin drip, subsequently transitioned to [place] held due to new abdominal collection/abscess and patient was changed back to heparin drip  -Continue heparin drip per pharmacy protocol, until interventions completed and safe to resume per surgery    Active Problems: Perforated gastric ulcer status post ex lap and Graham patch by Dr. Arnoldo Morale 8/30 Pelvic abscess status post IR drainage, cultures Enterococcus, Candida -General surgery and IR following, status post IR aspiration of the fluid 9/14, cultures pending -Per surgery, if collection reaccumulate, would be more suggestive of leak, remains high risk for peritonitis. Repeat CT with PO and IV contrast on Sunday.  -Continue dressing change, mobilize, underwent exchange of abdominal drain today per IR  -Continue Zosyn, fluconazole -Continue IV PPI   Essential hypertension BP currently stable  History of depression Currently stable, as needed lorazepam  Hypokalemia -Resolved  Constipation -Had 3 BMs in the last 24 hours, will hold Colace and Dulcolax   Obesity Estimated body mass index  [facility]                                                                              Patient Demographics  Kylena Mole, is a 64 y.o. female, DOB - [birth year], GNF:[id number]  Admit date - 04/21/2020   Admitting Physician Lequita Halt, MD  Outpatient Primary MD for the patient is Rosita Fire, MD  Outpatient specialists:   LOS - 17  days   Medical records reviewed and are as summarized below:    Chief Complaint  Patient presents with  . Abdominal Pain       Brief summary   64 year old female, hospitalizedto [facility],with the working diagnosis biliary pancreatitis,chronic cholecystitis/cholelithiasis. Patient underwent further work-up with MRCP, which showed free fluid in the abdomen and pneumoperitoneum. Patient was placed on broad-spectrum antibiotic therapy and vasopressors, she underwent emergent laparotomy. Patient was found to have a perforation in the prepyloric area along the lesser curvature of the stomach and antrum, and a Phillip Heal application was performed. Surgical culture positive for Enterococcus and Candida. Patient was stabilized with antibiotic therapy,butshe developed worsening leukocytosis.  A CT of the abdomen pelvis on 9/7 showed a new 5.3 x 14 x 3.9 cm abscess in the pelvis,5.1 x 2.6 x 2.0 subcapsular fluid collection along the right inferior liver, also concerning for abscess. CT of the chest with multifocal pulmonary embolus, pulmonary emboli arising in the saddledistribution from each main pulmonary artery with extension into multiple lower lobe point arteries bilaterally. Positive right heart strain,consistent with submassive. She underwent 12 F drain placement into the low abdominopelvic fluid collection. Culture positive for enterococcus and candida. Patient has been placed on heparin drip. Echo with preserved RV function  Follow up CT abdomen and pelvis (09/13), after completing 2 weeks of antibiotic therapy with  development of 3.4x1.8x.19 right upper abdominal gas and fluid collection in the area of prior pre-pyloric gastric ulcer perforation. New subhepatic abscess, with cutaneous fistula General surgery, IR reconsulted    Assessment & Plan    Principal Problem: Acute submassive pulmonary embolism/saddle embolism with multiple bilateral embolus/acute hypoxic respiratory failure -2D echo with preserved LV and RV function. -Patient was placed on IV heparin drip, subsequently transitioned to [place] held due to new abdominal collection/abscess and patient was changed back to heparin drip  -Continue heparin drip per pharmacy protocol, until interventions completed and safe to resume per surgery    Active Problems: Perforated gastric ulcer status post ex lap and Graham patch by Dr. Arnoldo Morale 8/30 Pelvic abscess status post IR drainage, cultures Enterococcus, Candida -General surgery and IR following, status post IR aspiration of the fluid 9/14, cultures pending -Per surgery, if collection reaccumulate, would be more suggestive of leak, remains high risk for peritonitis. Repeat CT with PO and IV contrast on Sunday.  -Continue dressing change, mobilize, underwent exchange of abdominal drain today per IR  -Continue Zosyn, fluconazole -Continue IV PPI   Essential hypertension BP currently stable  History of depression Currently stable, as needed lorazepam  Hypokalemia -Resolved  Constipation -Had 3 BMs in the last 24 hours, will hold Colace and Dulcolax   Obesity Estimated body mass index  By: Eddie Candle M.D.   On: 04/22/2020 10:31   ECHOCARDIOGRAM COMPLETE  Result Date: 05/02/2020    ECHOCARDIOGRAM REPORT   Patient Name:   Maria Murphy Date of Exam: 05/02/2020 Medical Rec #:   [id number]      Height:       64.0 in Accession #:    [id number]     Weight:       224.9 lb Date of Birth:  12/01/1955      BSA:          2.056 m Patient Age:    64 years       BP:           143/79 mmHg Patient Gender: F              HR:           71 bpm. Exam Location:  Inpatient Procedure: 2D Echo, Cardiac Doppler and Color Doppler Indications:    Pulmonary Embolus  History:        Patient has no prior history of Echocardiogram examinations.                 Risk Factors:Hypertension and Obesity. Pulmonary embolus, s/p GI                 surgery.  Sonographer:    Dustin Flock Referring Phys: [id number] [place]  1. Left ventricular ejection fraction, by estimation, is 60 to 65%. The left ventricle has normal function. The left ventricle has no regional wall motion abnormalities. Left ventricular diastolic parameters were normal. There is the interventricular septum is flattened in systole, consistent with right ventricular pressure overload.  2. Right ventricular systolic function is normal. The right ventricular size is mildly enlarged. There is moderately elevated pulmonary artery systolic pressure. The estimated right ventricular systolic pressure is 86.7 mmHg.  3. The mitral valve is normal in structure. Trivial mitral valve regurgitation. No evidence of mitral stenosis.  4. The aortic valve is tricuspid. Aortic valve regurgitation is not visualized. No aortic stenosis is present. Comparison(s): No prior Echocardiogram. FINDINGS  Left Ventricle: Left ventricular ejection fraction, by estimation, is 60 to 65%. The left ventricle has normal function. The left ventricle has no regional wall motion abnormalities. The left ventricular internal cavity size was normal in size. There is  no left ventricular hypertrophy. The interventricular septum is flattened in systole, consistent with right ventricular pressure overload. Left ventricular diastolic parameters were normal. Right Ventricle: The right  ventricular size is mildly enlarged. No increase in right ventricular wall thickness. Right ventricular systolic function is normal. There is moderately elevated pulmonary artery systolic pressure. The tricuspid regurgitant velocity is 3.28 m/s, and with an assumed right atrial pressure of 8 mmHg, the estimated right ventricular systolic pressure is 67.2 mmHg. Left Atrium: Left atrial size was normal in size. Right Atrium: Right atrial size was normal in size. Pericardium: There is no evidence of pericardial effusion. Mitral Valve: The mitral valve is normal in structure. Trivial mitral valve regurgitation. No evidence of mitral valve stenosis. Tricuspid Valve: The tricuspid valve is normal in structure. Tricuspid valve regurgitation is trivial. No evidence of tricuspid stenosis. Aortic Valve: The aortic valve is tricuspid. Aortic valve regurgitation is not visualized. No aortic stenosis is present. Pulmonic Valve: The pulmonic valve was not well visualized. Pulmonic valve regurgitation is not visualized. Aorta: The aortic root and ascending aorta are structurally normal, with no evidence of dilitation. Venous: The inferior vena cava  By: Eddie Candle M.D.   On: 04/22/2020 10:31   ECHOCARDIOGRAM COMPLETE  Result Date: 05/02/2020    ECHOCARDIOGRAM REPORT   Patient Name:   Maria Murphy Date of Exam: 05/02/2020 Medical Rec #:   [id number]      Height:       64.0 in Accession #:    [id number]     Weight:       224.9 lb Date of Birth:  12/01/1955      BSA:          2.056 m Patient Age:    64 years       BP:           143/79 mmHg Patient Gender: F              HR:           71 bpm. Exam Location:  Inpatient Procedure: 2D Echo, Cardiac Doppler and Color Doppler Indications:    Pulmonary Embolus  History:        Patient has no prior history of Echocardiogram examinations.                 Risk Factors:Hypertension and Obesity. Pulmonary embolus, s/p GI                 surgery.  Sonographer:    Dustin Flock Referring Phys: [id number] [place]  1. Left ventricular ejection fraction, by estimation, is 60 to 65%. The left ventricle has normal function. The left ventricle has no regional wall motion abnormalities. Left ventricular diastolic parameters were normal. There is the interventricular septum is flattened in systole, consistent with right ventricular pressure overload.  2. Right ventricular systolic function is normal. The right ventricular size is mildly enlarged. There is moderately elevated pulmonary artery systolic pressure. The estimated right ventricular systolic pressure is 86.7 mmHg.  3. The mitral valve is normal in structure. Trivial mitral valve regurgitation. No evidence of mitral stenosis.  4. The aortic valve is tricuspid. Aortic valve regurgitation is not visualized. No aortic stenosis is present. Comparison(s): No prior Echocardiogram. FINDINGS  Left Ventricle: Left ventricular ejection fraction, by estimation, is 60 to 65%. The left ventricle has normal function. The left ventricle has no regional wall motion abnormalities. The left ventricular internal cavity size was normal in size. There is  no left ventricular hypertrophy. The interventricular septum is flattened in systole, consistent with right ventricular pressure overload. Left ventricular diastolic parameters were normal. Right Ventricle: The right  ventricular size is mildly enlarged. No increase in right ventricular wall thickness. Right ventricular systolic function is normal. There is moderately elevated pulmonary artery systolic pressure. The tricuspid regurgitant velocity is 3.28 m/s, and with an assumed right atrial pressure of 8 mmHg, the estimated right ventricular systolic pressure is 67.2 mmHg. Left Atrium: Left atrial size was normal in size. Right Atrium: Right atrial size was normal in size. Pericardium: There is no evidence of pericardial effusion. Mitral Valve: The mitral valve is normal in structure. Trivial mitral valve regurgitation. No evidence of mitral valve stenosis. Tricuspid Valve: The tricuspid valve is normal in structure. Tricuspid valve regurgitation is trivial. No evidence of tricuspid stenosis. Aortic Valve: The aortic valve is tricuspid. Aortic valve regurgitation is not visualized. No aortic stenosis is present. Pulmonic Valve: The pulmonic valve was not well visualized. Pulmonic valve regurgitation is not visualized. Aorta: The aortic root and ascending aorta are structurally normal, with no evidence of dilitation. Venous: The inferior vena cava  [facility]                                                                              Patient Demographics  Kylena Mole, is a 64 y.o. female, DOB - [birth year], GNF:[id number]  Admit date - 04/21/2020   Admitting Physician Lequita Halt, MD  Outpatient Primary MD for the patient is Rosita Fire, MD  Outpatient specialists:   LOS - 17  days   Medical records reviewed and are as summarized below:    Chief Complaint  Patient presents with  . Abdominal Pain       Brief summary   64 year old female, hospitalizedto [facility] on[facility],with the working diagnosis biliary pancreatitis,chronic cholecystitis/cholelithiasis. Patient underwent further work-up with MRCP, which showed free fluid in the abdomen and pneumoperitoneum. Patient was placed on broad-spectrum antibiotic therapy and vasopressors, she underwent emergent laparotomy. Patient was found to have a perforation in the prepyloric area along the lesser curvature of the stomach and antrum, and a Phillip Heal application was performed. Surgical culture positive for Enterococcus and Candida. Patient was stabilized with antibiotic therapy,butshe developed worsening leukocytosis.  A CT of the abdomen pelvis on 9/7 showed a new 5.3 x 14 x 3.9 cm abscess in the pelvis,5.1 x 2.6 x 2.0 subcapsular fluid collection along the right inferior liver, also concerning for abscess. CT of the chest with multifocal pulmonary embolus, pulmonary emboli arising in the saddledistribution from each main pulmonary artery with extension into multiple lower lobe point arteries bilaterally. Positive right heart strain,consistent with submassive. She underwent 12 F drain placement into the low abdominopelvic fluid collection. Culture positive for enterococcus and candida. Patient has been placed on heparin drip. Echo with preserved RV function  Follow up CT abdomen and pelvis (09/13), after completing 2 weeks of antibiotic therapy with  development of [id number].19 right upper abdominal gas and fluid collection in the area of prior pre-pyloric gastric ulcer perforation. New subhepatic abscess, with cutaneous fistula General surgery, IR reconsulted    Assessment & Plan    Principal Problem: Acute submassive pulmonary embolism/saddle embolism with multiple bilateral embolus/acute hypoxic respiratory failure -2D echo with preserved LV and RV function. -Patient was placed on IV heparin drip, subsequently transitioned to [place] held due to new abdominal collection/abscess and patient was changed back to heparin drip  -Continue heparin drip per pharmacy protocol, until interventions completed and safe to resume per surgery    Active Problems: Perforated gastric ulcer status post ex lap and Graham patch by Dr. Arnoldo Morale 8/30 Pelvic abscess status post IR drainage, cultures Enterococcus, Candida -General surgery and IR following, status post IR aspiration of the fluid 9/14, cultures pending -Per surgery, if collection reaccumulate, would be more suggestive of leak, remains high risk for peritonitis. Repeat CT with PO and IV contrast on Sunday.  -Continue dressing change, mobilize, underwent exchange of abdominal drain today per IR  -Continue Zosyn, fluconazole -Continue IV PPI   Essential hypertension BP currently stable  History of depression Currently stable, as needed lorazepam  Hypokalemia -Resolved  Constipation -Had 3 BMs in the last 24 hours, will hold Colace and Dulcolax   Obesity Estimated body mass index

## 2020-05-09 NOTE — Progress Notes (Signed)
ANTICOAGULATION CONSULT NOTE - [place] for Heparin Indication: pulmonary embolus  Allergies  Allergen Reactions  . Wheat Extract Swelling    Patient Measurements: Height: 5\' 4"  (162.6 cm) Weight: 102 kg (224 lb 13.9 oz) IBW/kg (Calculated) : 54.7 Heparin Dosing Weight:  78.4 kg  Vital Signs: Temp: 98.7 F (37.1 C) (09/16 1502) Temp Source: Oral (09/16 1502) BP: 141/96 (09/16 1502) Pulse Rate: 77 (09/16 1502)  Labs: Recent Labs    05/07/20 0330 05/07/20 0330 05/07/20 1555 05/07/20 1843 05/08/20 0243 05/08/20 0243 05/08/20 1129 05/08/20 1757 05/08/20 2056 05/09/20 0745 05/09/20 1440  HGB 11.0*   < >  --   --  10.5*   < >  --   --  10.8* 9.9*  --   HCT 32.6*   < >  --   --  31.9*  --   --   --  32.4* 29.5*  --   PLT 231   < >  --   --  213  --   --   --  231 211  --   APTT  --    < >   < > 168* 136*  --    < > 41*  --  94* 74*  HEPARINUNFRC  --   --    < > >2.20* >2.20*  --   --   --   --  0.35  --   CREATININE 0.83  --   --   --  0.79  --   --   --   --   --   --    < > = values in this interval not displayed.    Estimated Creatinine Clearance: 83.6 mL/min (by C-G formula based on SCr of 0.79 mg/dL).   Assessment:  Heparin>Xarelto>heparin for submassive acute pulmonary embolism/ saddle with multiple bilateral embolus/per CTA 9/8; dopplers negative for DVT on 9/8.  - S/P peptic ulcer perforation - 9/14: surgery recommended stopping Xarelto (last dose 9/13 at 1637) and resuming heparin in preparation for surgery. Due to recent Xarelto exposure, will monitor anticoagulation using aPTT until aPTT and heparin levels correlate.  On 9/14, pt underwent CT aspiration of small sub-hepatic fluid collection. Per IR note, there remains residual fluid around existing L pelvin drain despite recent minimal input; pt had image-guided drain injection/exchange/upsizing on 9/16.  APTT and HL was within goal range at 94 sec and 0.35 this AM on heparin  infusion at 1000 units/hr; Hgb and Plt trend down to 9.9 and 211 this am. No issues with IV or bleeding noted.  Confirmatory aPTT this afternoon is therapeutic at 74 sec  Goal of Therapy:  Heparin level 0.3-0.7 units/ml aPTT 66-102 seconds Monitor platelets by anticoagulation protocol: Yes   Plan:  Continue heparin 1000 units/hr Monitor daily aPTT, heparin level, CBC Monitor for signs/symptoms of bleeding   Thank you for allowing [place] to participate in this patients care.   Jens Som, PharmD Please see amion for complete clinical pharmacist phone list. 05/09/2020 3:17 PM

## 2020-05-09 NOTE — Progress Notes (Signed)
ANTICOAGULATION CONSULT NOTE - [place] for Heparin Indication: pulmonary embolus  Allergies  Allergen Reactions  . Wheat Extract Swelling    Patient Measurements: Height: 5\' 4"  (162.6 cm) Weight: 102 kg (224 lb 13.9 oz) IBW/kg (Calculated) : 54.7 Heparin Dosing Weight:  78.4 kg  Vital Signs: Temp: 98.6 F (37 C) (09/16 0421) Temp Source: Oral (09/16 0421) BP: 141/79 (09/16 0421) Pulse Rate: 78 (09/16 0421)  Labs: Recent Labs    05/07/20 0330 05/07/20 0330 05/07/20 1555 05/07/20 1843 05/08/20 0243 05/08/20 0243 05/08/20 1129 05/08/20 1757 05/08/20 2056 05/09/20 0745  HGB 11.0*   < >  --   --  10.5*   < >  --   --  10.8* 9.9*  HCT 32.6*   < >  --   --  31.9*  --   --   --  32.4* 29.5*  PLT 231   < >  --   --  213  --   --   --  231 211  APTT  --    < >   < > 168* 136*   < > 82* 41*  --  94*  HEPARINUNFRC  --   --    < > >2.20* >2.20*  --   --   --   --  0.35  CREATININE 0.83  --   --   --  0.79  --   --   --   --   --    < > = values in this interval not displayed.    Estimated Creatinine Clearance: 83.6 mL/min (by C-G formula based on SCr of 0.79 mg/dL).   Assessment:  Heparin>Xarelto for submassive acute pulmonary embolism/ saddle with multiple bilateral embolus/per CTA 9/8; dopplers negative for DVT on 9/8.  - S/P peptic ulcer perforation - 9/14: surgery recommended stopping Xarelto (last dose 9/13 at 1637) and resuming heparin in preparation for surgery. Due to recent Xarelto exposure, will monitor anticoagulation using aPTT until aPTT and heparin levels correlate.  On 9/14, pt underwent CT aspiration of small sub-hepatic fluid collection. Per IR note, there remains residual fluid around existing L pelvin drain despite recent minimal input; pt had image-guided drain injection/exchange/upsizing on 9/16.  APTT and HL was within goal range at 94 sec and 0.35 this AM on heparin infusion at 1000 units/hr; Hgb and Plt trend down to 9.9  and 211 this am. No issues with IV or bleeding noted.  Goal of Therapy:  Heparin level 0.3-0.7 units/ml aPTT 66-102 seconds Monitor platelets by anticoagulation protocol: Yes   Plan:  Continue heparin 1000 units/hr Check 6-hr aPTT Monitor daily aPTT, heparin level, CBC Monitor for signs/symptoms of bleeding   Thank you for allowing [place] to participate in this patients care.   Jens Som, PharmD Please see amion for complete clinical pharmacist phone list. 05/09/2020 9:15 AM

## 2020-05-09 NOTE — Procedures (Signed)
Interventional Radiology Procedure Note  Procedure: Routine exchange of abdominal fluid drain.  Upsize to 28F. Return of significant volume of old blood products. To gravity.    Complications: None  Recommendations:  - To gravity - Do not submerge - Routine drain care - strongly consider moderate sedation for future drain exchanges given discomfort   Signed,  Dulcy Fanny. Earleen Newport, DO

## 2020-05-10 DIAGNOSIS — R0600 Dyspnea, unspecified: Secondary | ICD-10-CM

## 2020-05-10 LAB — CBC
HCT: 30.7 % — ABNORMAL LOW (ref 36.0–46.0)
Hemoglobin: 10.1 g/dL — ABNORMAL LOW (ref 12.0–15.0)
MCH: 28.5 pg (ref 26.0–34.0)
MCHC: 32.9 g/dL (ref 30.0–36.0)
MCV: 86.7 fL (ref 80.0–100.0)
Platelets: 219 10*3/uL (ref 150–400)
RBC: 3.54 MIL/uL — ABNORMAL LOW (ref 3.87–5.11)
RDW: 13.6 % (ref 11.5–15.5)
WBC: 7 10*3/uL (ref 4.0–10.5)
nRBC: 0 % (ref 0.0–0.2)

## 2020-05-10 LAB — HEPARIN LEVEL (UNFRACTIONATED)
Heparin Unfractionated: 0.28 IU/mL — ABNORMAL LOW (ref 0.30–0.70)
Heparin Unfractionated: 0.35 IU/mL (ref 0.30–0.70)

## 2020-05-10 LAB — APTT: aPTT: 116 seconds — ABNORMAL HIGH (ref 24–36)

## 2020-05-10 NOTE — Progress Notes (Signed)
Referring Physician(s): Rosita Fire [facility])  Supervising Physician: Sandi Mariscal  Patient Status:  [facility] - In-pt  Chief Complaint: "Rib pain"  Subjective:  History of ruptured pre-pyloric gastric ulcer s/p ex lap with gastrorrhaphy Phillip Heal patch) complicated by development of pelvic fluid collection s/p pelvic drain placement in IR 05/01/2020; most recently exchanged and upsized in IR 05/09/2020. Patient awake and alert sitting in bed watching TV. Complains of bilateral rib pain, rated 7/10 at this time. Pelvic drain site c/d/i.   Allergies: Wheat extract  Medications: Prior to Admission medications   Medication Sig Start Date End Date Taking? Authorizing Provider  acetaminophen (TYLENOL) 500 MG tablet Take 1 tablet (500 mg total) by mouth every 6 (six) hours as needed. 08/10/18  Yes Idol, Almyra Free, PA-C  citalopram (CELEXA) 20 MG tablet Take 1 tablet (20 mg total) by mouth daily. 08/16/19  Yes Jaynee Eagles, PA-C  donepezil (ARICEPT) 10 MG tablet Take 5-10 mg by mouth in the morning and at bedtime.  04/02/20  Yes [provider]  gabapentin (NEURONTIN) 300 MG capsule Take 300 mg by mouth 2 (two) times daily.  08/23/19  Yes [provider]  lisinopril-hydrochlorothiazide (PRINZIDE,ZESTORETIC) 10-12.5 MG tablet Take 1 tablet by mouth daily. 08/07/18  Yes [provider]  traZODone (DESYREL) 100 MG tablet Take 200 mg by mouth at bedtime.  08/02/19  Yes [provider]  Ginger, Zingiber officinalis, (GINGER PO) Take 1 tablet by mouth daily.     [provider]  LORazepam (ATIVAN) 1 MG tablet Take 1 tablet (1 mg total) by mouth every 6 (six) hours as needed for anxiety. 05/06/20   Arrien, Jimmy Picket, MD  methocarbamol (ROBAXIN) 500 MG tablet Take 1 tablet (500 mg total) by mouth every 8 (eight) hours as needed for muscle spasms. Patient not taking: Reported on 04/22/2020 03/26/20   Ezequiel Essex, MD  naproxen (NAPROSYN) 500 MG tablet Take 1  tablet (500 mg total) by mouth 2 (two) times daily as needed. Patient not taking: Reported on 04/22/2020 03/26/20   Ezequiel Essex, MD  oxyCODONE (OXY IR/ROXICODONE) 5 MG immediate release tablet Take 1 tablet (5 mg total) by mouth every 6 (six) hours as needed for moderate pain or severe pain. 05/06/20   Arrien, Jimmy Picket, MD  pantoprazole (PROTONIX) 40 MG tablet Take 1 tablet (40 mg total) by mouth 2 (two) times daily. 05/06/20 06/05/20  Arrien, Jimmy Picket, MD  Rivaroxaban (XARELTO) 15 MG TABS tablet Take 1 tablet (15 mg total) by mouth 2 (two) times daily with a meal for 17 days. Complete on 05/23/20 05/06/20 05/23/20  Arrien, Jimmy Picket, MD  rivaroxaban (XARELTO) 20 MG TABS tablet Take 1 tablet (20 mg total) by mouth daily with supper. Start 05/24/20 05/24/20   Arrien, Jimmy Picket, MD     Vital Signs: BP (!) 145/84 (BP Location: Left Arm)   Pulse 76   Temp 98.6 F (37 C) (Oral)   Resp 20   Ht 5\' 4"  (1.626 m)   Wt 224 lb 13.9 oz (102 kg)   SpO2 95%   BMI 38.60 kg/m   Physical Exam Vitals and nursing note reviewed.  Constitutional:      General: She is not in acute distress.    Appearance: Normal appearance.  Pulmonary:     Effort: Pulmonary effort is normal. No respiratory distress.  Abdominal:     Comments: Pelvic drain site without tenderness, erythema, drainage, or active bleeding; approximately 10-15 cc bloody output in gravity bag; drain  flushes/aspirates without resistance.  Skin:    General: Skin is warm and dry.  Neurological:     Mental Status: She is alert and oriented to person, place, and time.     Imaging: CT ABDOMEN PELVIS W CONTRAST  Addendum Date: 05/06/2020   ADDENDUM REPORT: 05/06/2020 22:39 ADDENDUM: These results were called by telephone at the time of interpretation on 05/06/2020 at 10:38 pm to provider Dr. Cyd Silence, who verbally acknowledged these results. Electronically Signed   By: Iven Finn M.D.   On: 05/06/2020 22:39   Result  Date: 05/06/2020 CLINICAL DATA:  Intra-abdominal abscess follow-up in a patient with perforated pre-pyloric gastric ulcer status post repair EXAM: CT ABDOMEN AND PELVIS WITH CONTRAST TECHNIQUE: Multidetector CT imaging of the abdomen and pelvis was performed using the standard protocol following bolus administration of intravenous contrast. CONTRAST:  142mL OMNIPAQUE IOHEXOL 300 MG/ML  SOLN COMPARISON:  CT abdomen pelvis 04/30/2020, CT abdomen pelvis 04/20/2020, MR abdomen 04/22/2020 FINDINGS: Lower chest: Interval decrease in residual trace bilateral pleural effusions. Associated atelectasis of the bilateral, right greater than left lower lobes. Hepatobiliary: Interval decrease in size of a 3.4 x 2.1 x 1.8 cm (from 5.1 x 2.1 x 2cm) subcapsular fluid collection along the right inferior hepatic lobe (3:32). Subcentimeter hypodensity within the right hepatic dome is still too small to characterize. Cholelithiasis again noted. No biliary ductal dilatation. Pancreas: Redemonstration several fluid density lesions with associated calcifications open (3:40) within the head of the pancreas measuring up to 2.2 cm (3:40). The lesions are better evaluated on MRI abdomen 04/22/2020. Otherwise no main pancreatic duct dilatation distally. No abnormal pancreatic contour. Spleen: Normal in size without focal abnormality. Adrenals/Urinary Tract: No adrenal nodule bilaterally. Redemonstration of bilateral peripelvic cysts. Bilateral subcentimeter hypodensities are too small to characterize. Similar-appearing 2.9 cm fluid density lesion that likely represents a simple renal cyst within the left kidney. Redemonstration of a 2.3 cm heterogeneous exophytic lesion arising from the right kidney (3:34) that likely represents a benign proteinaceous versus hemorrhagic cyst per MR abdomen 04/22/20. Bilateral kidneys enhance symmetrically. No hydronephrosis. No hydroureter. The urinary bladder is unremarkable. Stomach/Bowel: PO contrast is noted  to reach the rectum. No PO contrast extravasation. Stomach is within normal limits. Appendix appears normal. No evidence of bowel wall thickening, distention, or inflammatory changes. Vascular/Lymphatic: No significant vascular findings are present. No enlarged abdominal or pelvic lymph nodes. Reproductive: Fibroid uterus.  No adnexal mass. Other: Trace free intraperitoneal fluid. Persistent free intraperitoneal gas within the upper abdomen with stable to slightly increased volume. Interval development of a new 3.4 x 1.8 x 1.9 cm gas and fluid collection just anterior to the gallbladder fundus (3:34) that appears to be continuous with the site of previous pre-pyloric gastric ulcer perforation (6:48-54). Interval placement of a left pelvic drain that terminates within a known irregular fluid and gas organized collection that has decreased in size with its largest pocket of fluid measuring 8 x 5.1 cm open (from 11 x 5 cm). Musculoskeletal: Suggestion of a cutaneous tract between the new right upper abdomen gas and fluid collection and upper mid subcutaneous soft tissues of the anterior abdomen/incision (3:36, 7:71). Healing anterior abdominal incision with nonspecific soft tissue densities along the peritoneum (3:64, 73) likely related to recent surgery/closure. Mild subcutaneous soft tissue edema. No acute or significant osseous findings. Multilevel degenerative changes of the spine. IMPRESSION: 1. Interval development of a 3.4 x 1.8 x 1.9 cm right upper abdominal gas and fluid collection just anterior to the gallbladder fundus  that appears to be continuous with the area of prior pre-pyloric gastric ulcer perforation (and therefore in keeping with a persistent enteric leak). This new subhepatic abscess also appears to be continuous with the upper abdominal subcutaneous soft tissues/incision consistent with a cutaneous fistula. 2. Interval decrease in size of a 3.4 x 2.1 x 1.8 cm subcapsular right hepatic fluid  collection. 3. Interval placement of a left pelvic drain with interval decrease in size of a left pelvic gas and fluid collection with the largest pocket now measuring 8 x 5 cm (from 11 x 5 cm). 4. Interval decrease in bilateral trace pleural effusions with bibasilar atelectasis. 5. Other imaging findings of potential clinical significance: Cholelithiasis. Similar-appearing cystic pancreatic head and neck lesions measuring up to 2.2 cm with associated calcifications that are better evaluated on MR abdomen 04/22/2020. Stable 2.3 cm heterogeneous right renal lesion that likely represents a benign proteinaceous versus hemorrhagic cyst per MRI abdomen 04/22/2020. Electronically Signed: By: Iven Finn M.D. On: 05/06/2020 22:23   IR Catheter Tube Change  Result Date: 05/09/2020 INDICATION: 64 year old female with a history Graham patch for ruptured hollow viscus EXAM: IMAGE GUIDED REPLACEMENT/UPSIZE OF PERCUTANEOUS DRAIN MEDICATIONS: The patient is currently admitted to the hospital and receiving intravenous antibiotics. The antibiotics were administered within an appropriate time frame prior to the initiation of the procedure. ANESTHESIA/SEDATION: None COMPLICATIONS: None PROCEDURE: Informed written consent was obtained from the patient after a thorough discussion of the procedural risks, benefits and alternatives. All questions were addressed. Maximal Sterile Barrier Technique was utilized including caps, mask, sterile gowns, sterile gloves, sterile drape, hand hygiene and skin antiseptic. A timeout was performed prior to the initiation of the procedure. Patient is position supine position on the interventional II table. The drain was prepped and draped in the usual sterile fashion. 1% lidocaine was used for local anesthesia. Contrast was injected confirming location, and also identifying that the drain was partially blocked with significant resistance upon injection. Bentson wire was passed through the drain  and modified Seldinger technique was used to upsize the drain from 12 [place] to a 14 [place] drain. Aspiration of significant volume of old blood products was performed. Drain was attached to gravity drainage.  Suture was placed. Patient had some discomfort during the procedure the remained hemodynamically stable. No complications. IMPRESSION: Routine exchange and up size of pelvic drain with 2 a 14 French drain and evacuation of significant volume of old blood products. Signed, Dulcy Fanny. Dellia Nims, [place] [facility] Radiology PLAN: Future drain changes should consider moderate sedation, given the patient's discomfort Electronically Signed   By: Corrie Mckusick D.O.   On: 05/09/2020 09:03   CT ASPIRATION  Result Date: 05/08/2020 INDICATION: 64 year old female with a history of Graham patch for perforated gastric ulcer, with new fluid collection in the sub a [place] space referred for aspiration EXAM: CT-GUIDED ASPIRATE OF UPPER ABDOMINAL FLUID COLLECTION MEDICATIONS: The patient is currently admitted to the hospital and receiving intravenous antibiotics. The antibiotics were administered within an appropriate time frame prior to the initiation of the procedure. ANESTHESIA/SEDATION: Fentanyl 1.0 mcg IV; Versed 50 mg IV Moderate Sedation Time:  10 minutes The patient was continuously monitored during the procedure by the interventional radiology nurse under my direct supervision. COMPLICATIONS: None PROCEDURE: Informed written consent was obtained from the patient after a thorough discussion of the procedural risks, benefits and alternatives. All questions were addressed. Maximal Sterile Barrier Technique was utilized including caps, mask, sterile gowns, sterile gloves,  sterile drape, hand hygiene and skin antiseptic. A timeout was performed prior to the initiation of the procedure. Patient was positioned supine position on CT gantry table. Scout CT was acquired for  planning purposes. Patient is prepped and draped in the usual sterile fashion. 1% lidocaine was used for local anesthesia. Using CT guidance, Yueh needle was advanced into the fluid collection of the subhepatic space. Aspirate of the gas and fluid collection was accomplished with approximately 3-4 cc of bloody fluid with some complexity. Culture was sent. Yueh catheter was removed and a sterile bandage was placed. Patient tolerated the procedure well and remained hemodynamically stable throughout. No complications were encountered and no significant blood loss. IMPRESSION: Status post CT-guided aspirate of subhepatic fluid collection, with a culture sent with some complex bloody fluid. Signed, Dulcy Fanny. Dellia Nims, [facility] Radiology Electronically Signed   By: Corrie Mckusick D.O.   On: 05/08/2020 07:08    Labs:  CBC: Recent Labs    05/08/20 0243 05/08/20 2056 05/09/20 0745 05/10/20 0214  WBC 7.3 7.4 6.8 7.0  HGB 10.5* 10.8* 9.9* 10.1*  HCT 31.9* 32.4* 29.5* 30.7*  PLT 213 231 211 219    COAGS: Recent Labs    05/01/20 0818 05/07/20 1555 05/08/20 1757 05/09/20 0745 05/09/20 1440 05/10/20 0214  INR 1.2  --   --   --   --   --   APTT  --    < > 41* 94* 74* 116*   < > = values in this interval not displayed.    BMP: Recent Labs    05/04/20 0146 05/05/20 0332 05/07/20 0330 05/08/20 0243  NA 135 135 134* 136  K 3.2* 4.0 4.2 3.6  CL 99 102 99 100  CO2 25 25 26 24   GLUCOSE 123* 138* 116* 125*  BUN 7* 7* 7* 6*  CALCIUM 8.0* 8.3* 8.6* 8.5*  CREATININE 0.66 0.77 0.83 0.79  GFRNONAA >60 >60 >60 >60  GFRAA >60 >60 >60 >60    LIVER FUNCTION TESTS: Recent Labs    04/23/20 0451 04/24/20 0442 05/02/20 0130 05/07/20 0330  BILITOT 0.9 0.8 0.7 0.6  AST 59* 25 25 40  ALT 22 14 15 22   ALKPHOS 26* 23* 64 54  PROT 6.4* 5.3* 5.4* 6.7  ALBUMIN 3.2* 2.5* 2.0* 2.4*    Assessment and Plan:  History of ruptured pre-pyloric  gastric ulcer s/p ex lap with gastrorrhaphy Phillip Heal patch) complicated by development of pelvic fluid collection s/p pelvic drain placement in IR 05/01/2020; most recently exchanged and upsized in IR 05/09/2020. Pelvic drain stable with approximately 10-15 cc bloody output in gravity bag. Continue current drain management- continue with Qshift flushes/monitor of output. Plan for repeat CT/possible drain injection when output <10 cc/day (assess for possible removal). Further plans per TRH/CCS- appreciate and agree with management. IR to follow.   Electronically Signed: Earley Abide, PA-C 05/10/2020, 11:26 AM   I spent a total of 25 Minutes at the the patient's bedside AND on the patient's hospital floor or unit, greater than 50% of which was counseling/coordinating care for pelvic fluid collection s/p pelvic drain placement.

## 2020-05-10 NOTE — Progress Notes (Signed)
Occupational Therapy Treatment Patient Details Name: Maria Murphy MRN: [id number] DOB: 11-22-1955 Today's Date: 05/10/2020    History of present illness Pt adm to [facility] on 8/29 with biliary pancreatitis, chronic cholecystitis. Pt found to have gastric perforation and underwent emergent laparotomy for repair. On 9/7 pt found to have pelvic abscesses and drain placed. 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 Transferred to [facility] on 9/8.  PMH - HTN, foot ulcer, memory loss, obesity.   OT comments  Patient motivated for participation with OT treatment with focus on functional transfers, short distance mobility in prep for BADLs, and pain management strategies with movement. Patient continues to require +2 assist for sit to stand transfers and for bed mobility 2/2 pain and generalized weakness. Patient would benefit from continued acute OT services to maximize safety and independence with self-care tasks in prep for safe d/c to next level of care.    Follow Up Recommendations  SNF;Supervision/Assistance - 24 hour    Equipment Recommendations  Other (comment) (Defer to next level of care)    Recommendations for Other Services      Precautions / Restrictions Precautions Precautions: Fall Precaution Comments: painful abdominal wound, premedicate Restrictions Weight Bearing Restrictions: No       Mobility Bed Mobility Overal bed mobility: Needs Assistance Bed Mobility: Rolling;Sit to Sidelying Rolling: Mod assist Sidelying to sit: Mod assist   Sit to supine: Mod assist;+2 for physical assistance   General bed mobility comments: Mod A +2 for return to supine at trunk and BLE 2/2 pain in abdomen with cues for technique.   Transfers Overall transfer level: Needs assistance Equipment used: Rolling walker (2 wheeled) Transfers: Sit to/from Stand Sit to Stand: Mod assist;+2 safety/equipment         General transfer comment: Patient with good recall of hand placement on RW  from previous PT session.     Balance   Sitting-balance support: Feet supported;No upper extremity supported Sitting balance-Leahy Scale: Fair     Standing balance support: During functional activity;Bilateral upper extremity supported Standing balance-Leahy Scale: Poor Standing balance comment: Reliant on BUE                           ADL either performed or assessed with clinical judgement   ADL                           Toilet Transfer: Minimal assistance;Ambulation;+2 for safety/equipment;RW;+2 for physical assistance                   Vision       Perception     Praxis      Cognition Arousal/Alertness: Awake/alert Behavior During Therapy: WFL for tasks assessed/performed Overall Cognitive Status: Within Functional Limits for tasks assessed                                          Exercises Exercises: Other exercises Other Exercises Other Exercises: warm up hip/knee flex/ext ROM with graded resistance in extension.   Shoulder Instructions       General Comments      Pertinent Vitals/ Pain       Pain Assessment: 0-10 Pain Score: 7  Pain Location: abdomen with mobility Pain Descriptors / Indicators: Grimacing;Guarding Pain Intervention(s): Monitored during session;Repositioned  Home Living  Prior Functioning/Environment              Frequency  Min 2X/week        Progress Toward Goals  OT Goals(current goals can now be found in the care plan section)  Progress towards OT goals: Progressing toward goals  Acute Rehab OT Goals Patient Stated Goal: To decrease pain OT Goal Formulation: With patient Time For Goal Achievement: 05/22/20 Potential to Achieve Goals: Good ADL Goals Pt Will Perform Grooming: with min assist;standing Pt Will Perform Upper Body Bathing: with min assist;sitting Pt Will Perform Upper Body Dressing: with min  assist;sitting Pt Will Transfer to Toilet: with min assist;ambulating;bedside commode Pt Will Perform Toileting - Clothing Manipulation and hygiene: with min assist;sit to/from stand Additional ADL Goal #1: Pt will perform bed mobility with min assist in preparation for ADL.  Plan Discharge plan remains appropriate    Co-evaluation                 AM-PAC OT "6 Clicks" Daily Activity     Outcome Measure   Help from another person eating meals?: A Little Help from another person taking care of personal grooming?: A Little Help from another person toileting, which includes using toliet, bedpan, or urinal?: Total Help from another person bathing (including washing, rinsing, drying)?: A Lot Help from another person to put on and taking off regular upper body clothing?: A Lot Help from another person to put on and taking off regular lower body clothing?: Total 6 Click Score: 12    End of Session Equipment Utilized During Treatment: Gait belt;Rolling walker  OT Visit Diagnosis: Unsteadiness on feet (R26.81);Other abnormalities of gait and mobility (R26.89);Pain;Muscle weakness (generalized) (M62.81) Pain - part of body:  (Abdomen)   Activity Tolerance Patient limited by pain   Patient Left in bed;with call bell/phone within reach;with bed alarm set;with family/visitor present   Nurse Communication          Time: 5784-6962 OT Time Calculation (min): 24 min  Charges: OT General Charges $OT Visit: 1 Visit OT Treatments $Therapeutic Activity: 23-37 mins  Nafeesa Dils H. OTR/L Supplemental OT, Department of rehab services [phone]   Roque Schill R H. 05/10/2020, 3:02 PM

## 2020-05-10 NOTE — Progress Notes (Signed)
[facility]                                                                              Patient Demographics  Maria Murphy, is a 64 y.o. female, DOB - [birth year], YNW:[id number]  Admit date - 04/21/2020   Admitting Physician Emeline General, MD  Outpatient Primary MD for the patient is Avon Gully, MD  Outpatient specialists:   LOS - 18  days   Medical records reviewed and are as summarized below:    Chief Complaint  Patient presents with  . Abdominal Pain       Brief summary   64 year old female, hospitalizedto [facility] on8/29,with the working diagnosis biliary pancreatitis,chronic cholecystitis/cholelithiasis. Patient underwent further work-up with MRCP, which showed free fluid in the abdomen and pneumoperitoneum. Patient was placed on broad-spectrum antibiotic therapy and vasopressors, she underwent emergent laparotomy. Patient was found to have a perforation in the prepyloric area along the lesser curvature of the stomach and antrum, and a Cheree Ditto application was performed. Surgical culture positive for Enterococcus and Candida. Patient was stabilized with antibiotic therapy,butshe developed worsening leukocytosis.  A CT of the abdomen pelvis on 9/7 showed a new 5.3 x 14 x 3.9 cm abscess in the pelvis,5.1 x 2.6 x 2.0 subcapsular fluid collection along the right inferior liver, also concerning for abscess. CT of the chest with multifocal pulmonary embolus, pulmonary emboli arising in the saddledistribution from each main pulmonary artery with extension into multiple lower lobe point arteries bilaterally. Positive right heart strain,consistent with submassive. She underwent 12 F drain placement into the low abdominopelvic fluid collection. Culture positive for enterococcus and candida. Patient has been placed on heparin drip. Echo with preserved RV function  Follow up CT abdomen and pelvis (09/13), after completing 2 weeks of antibiotic therapy with  development of 3.4x1.8x.19 right upper abdominal gas and fluid collection in the area of prior pre-pyloric gastric ulcer perforation. New subhepatic abscess, with cutaneous fistula General surgery, IR reconsulted    Assessment & Plan    Principal Problem: Acute submassive pulmonary embolism/saddle embolism with multiple bilateral embolus/acute hypoxic respiratory failure -2D echo with preserved LV and RV function. -Patient was placed on IV heparin drip, subsequently transitioned to Xarelto -Xarelto held due to new abdominal collection/abscess and patient was changed back to heparin drip  -Continue heparin drip per pharmacy protocol, until interventions completed and safe to resume per surgery    Active Problems: Perforated gastric ulcer status post ex lap and Graham patch by Dr. Lovell Sheehan 8/30 Pelvic abscess status post IR drainage, cultures Enterococcus, Candida -General surgery and IR following, status post IR aspiration of the fluid 9/14, cultures positive for E. coli, Candida tropicalis -Per surgery, if collection reaccumulate, would be more suggestive of leak, remains high risk for peritonitis. Repeat CT with PO and IV contrast on Sunday.  -Continue dressing change, mobilize, underwent exchange of abdominal drain by IR on 9/16 -Continue IV Zosyn, fluconazole, PPI    Essential hypertension BP stable  History of depression Currently stable  Hypokalemia -Follow BMET in a.m.  Constipation -Holding Colace and Dulcolax due to diarrhea on 9/16   Obesity Estimated body mass index  is 38.6 kg/m as calculated from the following:   Height as of this encounter: 5\' 4"  (1.626 m).   Weight as of this encounter: 102 kg.  Code Status: Full CODE STATUS DVT Prophylaxis: Heparin drip Family Communication: Discussed all imaging results, lab results, explained to the patient    Disposition Plan:     Status is: Inpatient  Remains inpatient appropriate because:Inpatient level of care  appropriate due to severity of illness   Dispo: The patient is from: Home              Anticipated d/c is to: SNF              Anticipated d/c date is: > 3 days              Patient currently is not medically stable to d/c.      Time Spent in minutes      Procedures:  Laparotomy IR pelvic drain to abscess  Consultants:   General surgery Infectious disease Interventional radiology  Antimicrobials:   Anti-infectives (From admission, onward)   Start     Dose/Rate Route Frequency Ordered Stop   05/07/20 1000  fluconazole (DIFLUCAN) IVPB 400 mg        400 mg 100 mL/hr over 120 Minutes Intravenous Every 24 hours 05/06/20 2353 05/14/20 0959   05/07/20 0000  piperacillin-tazobactam (ZOSYN) IVPB 3.375 g  Status:  Discontinued        3.375 g 100 mL/hr over 30 Minutes Intravenous Every 8 hours 05/06/20 2353 05/06/20 2355   05/07/20 0000  piperacillin-tazobactam (ZOSYN) IVPB 3.375 g        3.375 g 12.5 mL/hr over 240 Minutes Intravenous Every 8 hours 05/06/20 2356 05/13/20 2359   05/06/20 1600  piperacillin-tazobactam (ZOSYN) IVPB 3.375 g        3.375 g 100 mL/hr over 30 Minutes Intravenous  Once 05/06/20 0947 05/06/20 1708   04/26/20 1000  fluconazole (DIFLUCAN) IVPB 400 mg        400 mg 100 mL/hr over 120 Minutes Intravenous Every 24 hours 04/26/20 0907 05/06/20 2359   04/22/20 0100  piperacillin-tazobactam (ZOSYN) IVPB 3.375 g  Status:  Discontinued        3.375 g 12.5 mL/hr over 240 Minutes Intravenous Every 8 hours 04/22/20 0002 05/06/20 0946   04/22/20 0000  piperacillin-tazobactam (ZOSYN) IVPB 3.375 g  Status:  Discontinued        3.375 g 100 mL/hr over 30 Minutes Intravenous Every 8 hours 04/21/20 2349 04/22/20 0002         Medications  Scheduled Meds: . Chlorhexidine Gluconate Cloth  6 each Topical Daily  . feeding supplement (ENSURE ENLIVE)  237 mL Oral BID BM  . Gerhardt's butt cream   Topical Daily  . multivitamin with minerals  1 tablet Oral Daily    . pantoprazole (PROTONIX) IV  40 mg Intravenous Q12H  . sodium chloride flush  10 mL Intracatheter Q8H  . sodium chloride flush  5 mL Intracatheter Q8H   Continuous Infusions: . sodium chloride 10 mL/hr at 05/07/20 1600  . dextrose 5% lactated ringers 75 mL/hr at 05/08/20 1617  . fluconazole (DIFLUCAN) IV 400 mg (05/10/20 0950)  . heparin 1,150 Units/hr (05/10/20 1235)  . piperacillin-tazobactam (ZOSYN)  IV 3.375 g (05/10/20 0754)   PRN Meds:.sodium chloride, acetaminophen **OR** acetaminophen, albuterol, LORazepam, morphine injection, ondansetron **OR** ondansetron (ZOFRAN) IV, oxyCODONE, phenol      Subjective:   Maria Murphy was seen and examined today.  No acute complaints today, occasionally pain under the ribs on taking deep breaths.  No active nausea vomiting, fevers or chills.     Objective:   Vitals:   05/09/20 2000 05/10/20 0400 05/10/20 0752 05/10/20 1130  BP: (!) 155/86 130/78 (!) 145/84 (!) 149/92  Pulse: 85 78 76 84  Resp: 20 17 20  (!) 22  Temp: 99.2 F (37.3 C) 98.6 F (37 C) 98.6 F (37 C) 97.7 F (36.5 C)  TempSrc: Oral Oral Oral Oral  SpO2: 95% 95% 95% 96%  Weight:      Height:        Intake/Output Summary (Last 24 hours) at 05/10/2020 1354 Last data filed at 05/10/2020 1218 Gross per 24 hour  Intake 240 ml  Output 740 ml  Net -500 ml     Wt Readings from Last 3 Encounters:  04/25/20 102 kg  03/25/20 90.7 kg  11/21/19 95.3 kg    Physical Exam  General: Alert and oriented x 3, NAD  Cardiovascular: S1 S2 clear, RRR. No pedal edema b/l  Respiratory: CTAB, no wheezing, rales or rhonchi  Gastrointestinal: Soft, mildly tender, midline incision, LLQ drain  Ext: no pedal edema bilaterally  Neuro: no new deficits  Musculoskeletal: No cyanosis, clubbing  Skin: No rashes  Psych: Normal affect and demeanor, alert and oriented x3         Data Reviewed:  I have personally reviewed following labs and imaging studies  Micro  Results Recent Results (from the past 240 hour(s))  Aerobic/Anaerobic Culture (surgical/deep wound)     Status: None   Collection Time: 05/01/20  2:43 PM   Specimen: Abscess  Result Value Ref Range Status   Specimen Description   Final    ABSCESS Performed at [facility], [address], [place], [place] [address]    Special Requests ABSCESS  Final   Gram Stain   Final    ABUNDANT WBC PRESENT,BOTH PMN AND MONONUCLEAR NO ORGANISMS SEEN    Culture   Final    FEW CANDIDA ALBICANS NO ANAEROBES ISOLATED Performed at [facility], [address], [place], [place] [address]    Report Status 05/06/2020 FINAL  Final  SARS Coronavirus 2 by RT PCR (hospital order, performed in [facility] hospital lab) Nasopharyngeal Nasopharyngeal Swab     Status: None   Collection Time: 05/05/20  6:00 PM   Specimen: Nasopharyngeal Swab  Result Value Ref Range Status   SARS Coronavirus 2 NEGATIVE NEGATIVE Final    Comment: (NOTE) SARS-CoV-2 target nucleic acids are NOT DETECTED.  The SARS-CoV-2 RNA is generally detectable in upper and lower respiratory specimens during the acute phase of infection. The lowest concentration of SARS-CoV-2 viral copies this assay can detect is 250 copies / mL. A negative result does not preclude SARS-CoV-2 infection and should not be used as the sole basis for treatment or other patient management decisions.  A negative result may occur with improper specimen collection / handling, submission of specimen other than nasopharyngeal swab, presence of viral mutation(s) within the areas targeted by this assay, and inadequate number of viral copies (<250 copies / mL). A negative result must be combined with clinical observations, patient history, and epidemiological information.  Fact Sheet for Patients:   BoilerBrush.com.cy  Fact Sheet for Healthcare Providers: https://pope.com/  This test is not yet approved or   cleared by the [place] FDA and has been authorized for detection and/or diagnosis of SARS-CoV-2 by FDA under an Emergency Use Authorization (EUA).  This EUA will remain in effect (meaning this test can be used) for the duration of the COVID-19 declaration under Section 564(b)(1) of the Act, 21 U.S.C. section 360bbb-3(b)(1), unless the authorization is terminated or revoked sooner.  Performed at [facility], [address] [place], [place] [address]   Aerobic/Anaerobic Culture (surgical/deep wound)     Status: None (Preliminary result)   Collection Time: 05/07/20  5:22 PM   Specimen: Abdomen; Abscess  Result Value Ref Range Status   Specimen Description ABDOMEN ABSCESS  Final   Special Requests   Final    SUB HEPATIC FLUID NEAR PRIOR GASTRIC ULCER PERFORATION   Gram Stain   Final    ABUNDANT WBC PRESENT, PREDOMINANTLY PMN NO ORGANISMS SEEN    Culture   Final    RARE ESCHERICHIA COLI FEW CANDIDA TROPICALIS CULTURE REINCUBATED FOR BETTER GROWTH Performed at [facility], [address] [place], [place] [address]    Report Status PENDING  Incomplete   Organism ID, Bacteria ESCHERICHIA COLI  Final      Susceptibility   Escherichia coli - MIC*    AMPICILLIN >=32 RESISTANT Resistant     CEFAZOLIN 16 SENSITIVE Sensitive     CEFEPIME <=0.12 SENSITIVE Sensitive     CEFTAZIDIME <=1 SENSITIVE Sensitive     CEFTRIAXONE <=0.25 SENSITIVE Sensitive     CIPROFLOXACIN <=0.25 SENSITIVE Sensitive     GENTAMICIN <=1 SENSITIVE Sensitive     IMIPENEM <=0.25 SENSITIVE Sensitive     TRIMETH/SULFA <=20 SENSITIVE Sensitive     AMPICILLIN/SULBACTAM >=32 RESISTANT Resistant     PIP/TAZO <=4 SENSITIVE Sensitive     * RARE ESCHERICHIA COLI  Surgical PCR screen     Status: None   Collection Time: 05/08/20 10:33 PM   Specimen: Nasal Mucosa; Nasal Swab  Result Value Ref Range Status   MRSA, PCR NEGATIVE NEGATIVE Final   Staphylococcus aureus NEGATIVE NEGATIVE Final    Comment:  (NOTE) The Xpert SA Assay (FDA approved for NASAL specimens in patients 32 years of age and older), is one component of a comprehensive surveillance program. It is not intended to diagnose infection nor to guide or monitor treatment. Performed at [facility], [address] [place], [place] [address]     Radiology Reports DG Chest 1 View  Result Date: 05/02/2020 CLINICAL DATA:  At dyspnea EXAM: CHEST  1 VIEW COMPARISON:  Portable exam 1540 hours compared to 04/30/2020 FINDINGS: Chronic elevation of RIGHT diaphragm. Enlargement of cardiac silhouette. Mediastinal contours and pulmonary vascularity normal. Persistent RIGHT basilar atelectasis. No infiltrate, pleural effusion or pneumothorax. IMPRESSION: Persistent RIGHT basilar atelectasis. Electronically Signed   By: Ulyses Southward M.D.   On: 05/02/2020 15:55   CT Head Wo Contrast  Result Date: 04/20/2020 CLINICAL DATA:  Mental status change EXAM: CT HEAD WITHOUT CONTRAST TECHNIQUE: Contiguous axial images were obtained from the base of the skull through the vertex without intravenous contrast. COMPARISON:  CT brain 03/19/2020 FINDINGS: Brain: No acute territorial infarction, hemorrhage, or intracranial mass. The ventricles are nonenlarged. Vascular: No hyperdense vessels.  No unexpected calcification. Skull: Normal. Negative for fracture or focal lesion. Sinuses/Orbits: No acute finding. Other: None IMPRESSION: Negative non contrasted CT appearance of the brain. Electronically Signed   By: Jasmine Pang M.D.   On: 04/20/2020 21:14   CT ANGIO CHEST PE W OR WO CONTRAST  Result Date: 05/01/2020 CLINICAL DATA:  Pulmonary embolus seen on recent abdominal CT. Recent repair of gastric ulcer. EXAM: CT ANGIOGRAPHY  CHEST WITH CONTRAST TECHNIQUE: Multidetector CT imaging of the chest was performed using the standard protocol during bolus administration of intravenous contrast. Multiplanar CT image reconstructions and MIPs were obtained to evaluate the  vascular anatomy. CONTRAST:  OMNIPAQUE IOHEXOL 350 MG/ML SOLN COMPARISON:  CT abdomen and pelvis including lower lung regions April 30, 2020 FINDINGS: Cardiovascular: Pulmonary emboli arise in each main pulmonary artery in a saddle type distribution with extension of pulmonary emboli into multiple lower lobe pulmonary arterial branches bilaterally. Right ventricle to left ventricle diameter ratio is measured at 1.1 consistent with a degree of right heart strain. There is no appreciable thoracic aortic aneurysm or dissection. Visualized great vessels appear normal. No pericardial effusion or pericardial thickening is evident. Mediastinum/Nodes: Thyroid appears unremarkable. No adenopathy is evident. There is a small hiatal hernia. Lungs/Pleura: There are fairly small pleural effusions bilaterally with consolidation in each lower lobe region, somewhat more on the right than on the left. Upper lung regions are clear. No pneumothorax. Upper Abdomen: There is a mild degree of ascites. There is mild pneumoperitoneum which was present 1 day prior. Incomplete visualization of upper pole renal cystic lesions again noted with complex lesion arising from the periphery of the upper pole the right kidney, better seen on prior CT of the abdomen. Cholelithiasis again noted. Postoperative changes noted in the upper right abdomen, incompletely visualized. Air in the midline upper abdominal wall region likely of recent postoperative etiology. Musculoskeletal: Degenerative change noted in the thoracic spine. No blastic or lytic bone lesions. No appreciable chest wall lesions. Review of the MIP images confirms the above findings. IMPRESSION: 1. Again noted multifocal pulmonary embolus with pulmonary emboli arising in a saddle type distribution from each main pulmonary artery with extension into multiple lower lobe pulmonary arteries bilaterally. Positive for acute PE with CTevidence of right heart strain (RV/LV Ratio = 1.1)  consistent with at least submassive (intermediate risk) PE. The presence of right heart strain has been associated with an increased risk of morbidity and mortality. 2. Airspace consolidation consistent with combination of pneumonia and compressive atelectasis in each lower lung region with fairly small pleural effusions bilaterally. 3.  No appreciable adenopathy. 4.  Small hiatal hernia again noted. 5. Pneumoperitoneum likely secondary to recent surgery. Mild ascites present. Postoperative changes in right upper quadrant again noted. Air in the upper abdominal wall is likely of recent postoperative etiology. 6.  Cholelithiasis. 7. Incomplete visualization of renal cystic lesions with complex mass arising from upper pole right kidney, better seen and characterized on recent abdominal CT examination. These results will be called to the ordering clinician or representative by the Radiologist Assistant, and communication documented in the PACS or [employer]. Electronically Signed   By: Bretta Bang III M.D.   On: 05/01/2020 11:22   CT ABDOMEN PELVIS W CONTRAST  Addendum Date: 05/06/2020   ADDENDUM REPORT: 05/06/2020 22:39 ADDENDUM: These results were called by telephone at the time of interpretation on 05/06/2020 at 10:38 pm to provider Dr. Leafy Half, who verbally acknowledged these results. Electronically Signed   By: Tish Frederickson M.D.   On: 05/06/2020 22:39   Result Date: 05/06/2020 CLINICAL DATA:  Intra-abdominal abscess follow-up in a patient with perforated pre-pyloric gastric ulcer status post repair EXAM: CT ABDOMEN AND PELVIS WITH CONTRAST TECHNIQUE: Multidetector CT imaging of the abdomen and pelvis was performed using the standard protocol following bolus administration of intravenous contrast. CONTRAST:  OMNIPAQUE IOHEXOL 300 MG/ML  SOLN COMPARISON:  CT abdomen pelvis 04/30/2020, CT  abdomen pelvis 04/20/2020, MR abdomen 04/22/2020 FINDINGS: Lower chest: Interval decrease in residual  trace bilateral pleural effusions. Associated atelectasis of the bilateral, right greater than left lower lobes. Hepatobiliary: Interval decrease in size of a 3.4 x 2.1 x 1.8 cm (from 5.1 x 2.1 x 2cm) subcapsular fluid collection along the right inferior hepatic lobe (3:32). Subcentimeter hypodensity within the right hepatic dome is still too small to characterize. Cholelithiasis again noted. No biliary ductal dilatation. Pancreas: Redemonstration several fluid density lesions with associated calcifications open (3:40) within the head of the pancreas measuring up to 2.2 cm (3:40). The lesions are better evaluated on MRI abdomen 04/22/2020. Otherwise no main pancreatic duct dilatation distally. No abnormal pancreatic contour. Spleen: Normal in size without focal abnormality. Adrenals/Urinary Tract: No adrenal nodule bilaterally. Redemonstration of bilateral peripelvic cysts. Bilateral subcentimeter hypodensities are too small to characterize. Similar-appearing 2.9 cm fluid density lesion that likely represents a simple renal cyst within the left kidney. Redemonstration of a 2.3 cm heterogeneous exophytic lesion arising from the right kidney (3:34) that likely represents a benign proteinaceous versus hemorrhagic cyst per MR abdomen 04/22/20. Bilateral kidneys enhance symmetrically. No hydronephrosis. No hydroureter. The urinary bladder is unremarkable. Stomach/Bowel: PO contrast is noted to reach the rectum. No PO contrast extravasation. Stomach is within normal limits. Appendix appears normal. No evidence of bowel wall thickening, distention, or inflammatory changes. Vascular/Lymphatic: No significant vascular findings are present. No enlarged abdominal or pelvic lymph nodes. Reproductive: Fibroid uterus.  No adnexal mass. Other: Trace free intraperitoneal fluid. Persistent free intraperitoneal gas within the upper abdomen with stable to slightly increased volume. Interval development of a new 3.4 x 1.8 x 1.9 cm gas  and fluid collection just anterior to the gallbladder fundus (3:34) that appears to be continuous with the site of previous pre-pyloric gastric ulcer perforation (6:48-54). Interval placement of a left pelvic drain that terminates within a known irregular fluid and gas organized collection that has decreased in size with its largest pocket of fluid measuring 8 x 5.1 cm open (from 11 x 5 cm). Musculoskeletal: Suggestion of a cutaneous tract between the new right upper abdomen gas and fluid collection and upper mid subcutaneous soft tissues of the anterior abdomen/incision (3:36, 7:71). Healing anterior abdominal incision with nonspecific soft tissue densities along the peritoneum (3:64, 73) likely related to recent surgery/closure. Mild subcutaneous soft tissue edema. No acute or significant osseous findings. Multilevel degenerative changes of the spine. IMPRESSION: 1. Interval development of a 3.4 x 1.8 x 1.9 cm right upper abdominal gas and fluid collection just anterior to the gallbladder fundus that appears to be continuous with the area of prior pre-pyloric gastric ulcer perforation (and therefore in keeping with a persistent enteric leak). This new subhepatic abscess also appears to be continuous with the upper abdominal subcutaneous soft tissues/incision consistent with a cutaneous fistula. 2. Interval decrease in size of a 3.4 x 2.1 x 1.8 cm subcapsular right hepatic fluid collection. 3. Interval placement of a left pelvic drain with interval decrease in size of a left pelvic gas and fluid collection with the largest pocket now measuring 8 x 5 cm (from 11 x 5 cm). 4. Interval decrease in bilateral trace pleural effusions with bibasilar atelectasis. 5. Other imaging findings of potential clinical significance: Cholelithiasis. Similar-appearing cystic pancreatic head and neck lesions measuring up to 2.2 cm with associated calcifications that are better evaluated on MR abdomen 04/22/2020. Stable 2.3 cm  heterogeneous right renal lesion that likely represents a benign proteinaceous versus hemorrhagic cyst  per MRI abdomen 04/22/2020. Electronically Signed: By: Tish Frederickson M.D. On: 05/06/2020 22:23   CT ABDOMEN PELVIS W CONTRAST  Addendum Date: 05/01/2020   ADDENDUM REPORT: 05/01/2020 08:21 ADDENDUM: Upon further review, there are new thin non-occlusive saddle pulmonary emboli involving the left and right pulmonary arteries with some extension into both lower lobes. Dedicated CTA of the chest is recommended for further evaluation. Critical Value/emergent results were called by telephone on 05/01/2020 at 8:20 am to provider [facility], who verbally acknowledged these results. Electronically Signed   By: Obie Dredge M.D.   On: 05/01/2020 08:21   Result Date: 05/01/2020 CLINICAL DATA:  History of perforated pre-pyloric gastric ulcer status post repair 8 days ago, now with worsening epigastric pain and vomiting. EXAM: CT ABDOMEN AND PELVIS WITH CONTRAST TECHNIQUE: Multidetector CT imaging of the abdomen and pelvis was performed using the standard protocol following bolus administration of intravenous contrast. CONTRAST:  OMNIPAQUE IOHEXOL 300 MG/ML  SOLN COMPARISON:  MRI abdomen dated April 22, 2020. CT abdomen pelvis dated April 20, 2020. FINDINGS: Lower chest: Unchanged small right and trace left pleural effusions with adjacent lower lobe atelectasis. Hepatobiliary: Unchanged subcentimeter low-density lesion in the hepatic dome, too small to characterize. A small amount of perihepatic fluid with more focal 5.1 x 2.1 x 2.0 cm subcapsular fluid collection along the inferior right liver (series 2, image 31: Series 5, image 70) multiple gallstones again noted. No gallbladder wall thickening or biliary dilatation. Pancreas: Multiple cystic lesions in pancreatic head measuring up to 2.2 cm are unchanged. No ductal dilatation or surrounding inflammatory changes. Spleen: Normal in size without focal  abnormality. Adrenals/Urinary Tract: Adrenal glands are unremarkable. Unchanged bilateral renal cysts, including a 1.8 cm complex cyst in the upper pole of the right kidney as characterized on recent MRI. No renal calculi or hydronephrosis. Bladder is unremarkable. Stomach/Bowel: Unchanged small hiatal hernia. Mildly pre-pyloric gastric wall thickening is likely postsurgical. There are few mildly dilated loops of jejunum in the mid and lower abdomen with gradual transition to ileum. Oral contrast reaches the terminal ileum. No bowel wall thickening or surrounding inflammatory changes. Left-sided colonic diverticulosis. Normal appendix. Vascular/Lymphatic: No significant vascular findings. No enlarged abdominal or pelvic lymph nodes. Reproductive: Unchanged fibroid uterus.  No adnexal mass. Other: New 5.3 x 14.0 x 3.9 cm (AP by transverse by CC) rim enhancing fluid collection in the pelvis anterior to the uterine fundus. There are few residual foci of pneumoperitoneum in the right upper quadrant and deep to the midline abdominal incision. Musculoskeletal: No acute or significant osseous findings. IMPRESSION: 1. New 5.3 x 14.0 x 3.9 cm abscess in the pelvis. 2. Small amount of perihepatic fluid with more focal 5.1 x 2.1 x 2.0 cm subcapsular fluid collection along the inferior right liver, also concerning for abscess. 3. Trace residual pneumoperitoneum in the right upper quadrant and deep to the midline abdominal incision, likely postsurgical. 4. Mildly dilated loops of small bowel in the mid and lower abdomen are favored to represent ileus as oral contrast reaches the terminal ileum. 5. Unchanged cystic lesions in the pancreatic head measuring up to 2.2 cm, better evaluated on recent MRI. Please see that report for follow-up recommendations. 6. Unchanged cholelithiasis. 7. Unchanged small right and trace left pleural effusions. Electronically Signed: By: Obie Dredge M.D. On: 04/30/2020 19:04   CT ABDOMEN PELVIS  W CONTRAST  Result Date: 04/20/2020 CLINICAL DATA:  Acute abdominal pain. Elevated bilirubin. Altered mental status. EXAM: CT ABDOMEN AND PELVIS WITH CONTRAST  TECHNIQUE: Multidetector CT imaging of the abdomen and pelvis was performed using the standard protocol following bolus administration of intravenous contrast. CONTRAST:  75mL OMNIPAQUE IOHEXOL 300 MG/ML  SOLN COMPARISON:  No prior abdominal imaging. FINDINGS: Lower chest: Mild hypoventilatory changes in the lung bases. No pleural fluid or confluent airspace disease. Hepatobiliary: 7 mm hypodensity in the right dome of the liver, series 2, image 12, too small to accurately characterize. Multiple cholesterol gallstones in the gallbladder. No pericholecystic inflammation or evidence of wall thickening. No common bile duct dilatation. Pancreas: Coarse calcification in the pancreatic head. There are at least 3 cystic lesions in the pancreatic head/uncinate process, largest measuring 2.2 cm, series 2, image 29. There is an adjacent 1.3 cm cystic structure and a 1 cm cyst more inferiorly. No definite peripancreatic fat stranding or inflammation. There is no pancreatic ductal dilatation. Spleen: Normal in size without focal abnormality. Adrenals/Urinary Tract: No adrenal nodule. There is a punctate calcification involving the medial limb of the left adrenal gland. Indeterminate lesion arising from the upper right kidney measures 17 x 18 mm has internal heterogeneity and peripheral calcifications. This is adjacent to a small cortical cyst. There are scattered additional cysts within the right kidney as well as parapelvic cysts. Cortical as well as parapelvic cysts in the left kidney. No hydronephrosis. No perinephric edema. Urinary bladder is unremarkable. Stomach/Bowel: Small hiatal hernia with mild distal esophageal wall thickening. Stomach is unremarkable. Normal positioning of the duodenum and ligament of Treitz. There is no small bowel obstruction or  inflammatory change. Cecum is high-riding in the right mid abdomen. Normal air-filled appendix. Transverse colonic tortuosity. Colonic diverticulosis involving the distal descending and sigmoid colon. No acute diverticulitis. Vascular/Lymphatic: Aortic atherosclerosis with mild tortuosity. No aortic aneurysm. The portal vein is patent. There is bi-iliac tortuosity. No bulky enlarged abdominopelvic lymph nodes. Reproductive: Globular enlarged uterus with multiple presumed fibroids, the largest appears projecting posteriorly and measures 5.6 cm. Right ovary tentatively visualized and normal. The left ovary is not definitively seen. Other: No free air or free fluid. Musculoskeletal: Multilevel degenerative change in the spine. Vacuum phenomena at L4-L5 and L5-S1. Prominent facet hypertrophy. Modic endplate changes at T11-T12. There are no acute or suspicious osseous abnormalities. IMPRESSION: 1. Cholelithiasis without gallbladder inflammation. No biliary dilatation. 2. Coarse calcification in the pancreatic head consistent with chronic pancreatitis. There are at least 3 cystic lesions in the pancreatic head/uncinate process, largest measuring 2.2 cm. These may represent sequela of prior pancreatitis and pseudocysts, however recommend further evaluation with pancreatic protocol MRI for further characterization. This should ideally be performed on an elective basis after resolution of acute event when patient is able to tolerate breath hold technique. 3. Indeterminate 17 x 18 mm lesion arising from the upper right kidney has internal heterogeneity and peripheral calcifications. 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 Recommend MRI characterization of this lesion. There are additional simple cysts as well as parapelvic cysts in both kidneys. 4. Globular enlarged uterus with multiple presumed fibroids, the largest measuring 5.6 cm. 5. Small hiatal hernia  with mild distal esophageal wall thickening, can be seen with reflux or esophagitis. 6. Colonic diverticulosis without acute inflammation. Aortic Atherosclerosis (ICD10-I70.0). Electronically Signed   By: Narda Rutherford M.D.   On: 04/20/2020 23:14   IR Catheter Tube Change  Result Date: 05/09/2020 INDICATION: 64 year old female with a history Graham patch for ruptured hollow viscus EXAM: IMAGE GUIDED REPLACEMENT/UPSIZE OF PERCUTANEOUS DRAIN MEDICATIONS: The  patient is currently admitted to the hospital and receiving intravenous antibiotics. The antibiotics were administered within an appropriate time frame prior to the initiation of the procedure. ANESTHESIA/SEDATION: None COMPLICATIONS: None PROCEDURE: Informed written consent was obtained from the patient after a thorough discussion of the procedural risks, benefits and alternatives. All questions were addressed. Maximal Sterile Barrier Technique was utilized including caps, mask, sterile gowns, sterile gloves, sterile drape, hand hygiene and skin antiseptic. A timeout was performed prior to the initiation of the procedure. Patient is position supine position on the interventional II table. The drain was prepped and draped in the usual sterile fashion. 1% lidocaine was used for local anesthesia. Contrast was injected confirming location, and also identifying that the drain was partially blocked with significant resistance upon injection. Bentson wire was passed through the drain and modified Seldinger technique was used to upsize the drain from 12 [place] to a 14 [place] drain. Aspiration of significant volume of old blood products was performed. Drain was attached to gravity drainage.  Suture was placed. Patient had some discomfort during the procedure the remained hemodynamically stable. No complications. IMPRESSION: Routine exchange and up size of pelvic drain with 2 a 14 French drain and evacuation of significant volume of old blood products. Signed, Yvone Neu. Reyne Dumas, RPVI [facility] Radiology PLAN: Future drain changes should consider moderate sedation, given the patient's discomfort Electronically Signed   By: [name] D.O.   On: 05/09/2020 09:03   CT ASPIRATION  Result Date: 05/08/2020 INDICATION: 64 year old female with a history of Graham patch for perforated gastric ulcer, with new fluid collection in the sub a Paddock space referred for aspiration EXAM: CT-GUIDED ASPIRATE OF UPPER ABDOMINAL FLUID COLLECTION MEDICATIONS: The patient is currently admitted to the hospital and receiving intravenous antibiotics. The antibiotics were administered within an appropriate time frame prior to the initiation of the procedure. ANESTHESIA/SEDATION: Fentanyl 1.0 mcg IV; Versed 50 mg IV Moderate Sedation Time:  10 minutes The patient was continuously monitored during the procedure by the interventional radiology nurse under my direct supervision. COMPLICATIONS: None PROCEDURE: Informed written consent was obtained from the patient after a thorough discussion of the procedural risks, benefits and alternatives. All questions were addressed. Maximal Sterile Barrier Technique was utilized including caps, mask, sterile gowns, sterile gloves, sterile drape, hand hygiene and skin antiseptic. A timeout was performed prior to the initiation of the procedure. Patient was positioned supine position on CT gantry table. Scout CT was acquired for planning purposes. Patient is prepped and draped in the usual sterile fashion. 1% lidocaine was used for local anesthesia. Using CT guidance, Yueh needle was advanced into the fluid collection of the subhepatic space. Aspirate of the gas and fluid collection was accomplished with approximately 3-4 cc of bloody fluid with some complexity. Culture was sent. Yueh catheter was removed and a sterile bandage was placed. Patient tolerated the procedure well and remained hemodynamically stable  throughout. No complications were encountered and no significant blood loss. IMPRESSION: Status post CT-guided aspirate of subhepatic fluid collection, with a culture sent with some complex bloody fluid. Signed, Yvone Neu. Reyne Dumas, RPVI [facility] Electronically Signed   By: [name] D.O.   On: 05/08/2020 07:08   MR 3D Recon At Scanner  Result Date: 04/22/2020 CLINICAL DATA:  Pancreatitis suspected, characterize pancreatic lesions, characterize incidental right renal lesion EXAM: MRI ABDOMEN WITHOUT AND WITH CONTRAST (INCLUDING MRCP) TECHNIQUE: Multiplanar multisequence MR imaging of the  abdomen was performed both before and after the administration of intravenous contrast. Heavily T2-weighted images of the biliary and pancreatic ducts were obtained, and three-dimensional MRCP images were rendered by post processing. CONTRAST:  7mL GADAVIST GADOBUTROL 1 MMOL/ML IV SOLN COMPARISON:  CT abdomen pelvis, 04/20/2020 FINDINGS: Lower chest: Small right pleural effusion associated atelectasis or consolidation. Trace left pleural effusion. Hepatobiliary: No mass or other parenchymal abnormality identified. Gallstones in the gallbladder. No gallbladder wall thickening. No biliary ductal dilatation. Pancreas: There is a multicystic fluid signal lesion in the anterior pancreatic head measuring 4.1 x 2.6 x 3.0 cm (series 5, image 26, series 4, image 12). There are multiple associated small calcifications. This is clearly distinct from the pancreatic duct and there is no pancreatic ductal dilatation. No solid contrast enhancing component. Spleen:  Within normal limits in size and appearance. Adrenals/Urinary Tract: No masses identified. Numerous bilateral renal cysts, predominantly parapelvic although also with several cortical cysts. An exophytic cyst of the superior pole of the right kidney is partially calcified and demonstrates mixed intrinsic signal, but  demonstrates no contrast enhancement. Remaining cysts are simple and fluid signal without enhancement. No evidence of hydronephrosis. Stomach/Bowel: There is gastric mucosal thickening of the antrum and pylorus and a possible small ulceration of the anterior gastric antrum (series 5, image 22). Vascular/Lymphatic: No pathologically enlarged lymph nodes identified. No abdominal aortic aneurysm demonstrated. Other:  There is new moderate volume ascites and pneumoperitoneum. Musculoskeletal: No suspicious bone lesions identified. IMPRESSION: 1. There is new moderate volume ascites throughout the abdomen and pneumoperitoneum, concerning for perforated viscus organ. There is gastric mucosal thickening of the antrum and pylorus and a possible small ulceration of the anterior pylorus, suggesting a perforated gastric ulceration although nidus of perforation is not definitively localized. 2. There is a multicystic fluid signal lesion in the anterior pancreatic head measuring 4.1 cm. This is clearly distinct from the pancreatic duct and there are multiple associated small calcifications. Favor pancreatic pseudocyst although mucinous cystic neoplasm is a substantial differential consideration given appearance. Given lesion size recommend consideration of EUS and FNA for diagnosis. 3. No pancreatic ductal dilatation. 4. Numerous bilateral renal cysts, predominantly parapelvic although also with several cortical cysts. An exophytic cyst of the superior pole of the right kidney is partially calcified and demonstrates mixed intrinsic signal, but demonstrates no contrast enhancement. This is consistent with a benign proteinaceous or hemorrhagic cyst. 5. Cholelithiasis. No evidence of acute cholecystitis. No biliary ductal dilatation. These results were called by telephone at the time of interpretation on 04/22/2020 at 10:17 am to Dr. Sherryll Burger, who verbally acknowledged these results. Electronically Signed   By: Lauralyn Primes M.D.   On:  04/22/2020 10:31   US Venous Img Lower Bilateral (DVT)  Result Date: 05/01/2020 CLINICAL DATA:  Recent pulmonary embolus EXAM: BILATERAL LOWER EXTREMITY VENOUS DUPLEX ULTRASOUND TECHNIQUE: Gray-scale sonography with graded compression, as well as color Doppler and duplex ultrasound were performed to evaluate the lower extremity deep venous systems from the level of the common femoral vein and including the common femoral, femoral, profunda femoral, popliteal and calf veins including the posterior tibial, peroneal and gastrocnemius veins when visible. The superficial great saphenous vein was also interrogated. Spectral Doppler was utilized to evaluate flow at rest and with distal augmentation maneuvers in the common femoral, femoral and popliteal veins. COMPARISON:  CT abdomen and pelvis including portions of lower chest April 30, 2020 FINDINGS: RIGHT LOWER EXTREMITY Common Femoral Vein: No evidence of thrombus. Normal compressibility, respiratory phasicity and  response to augmentation. Saphenofemoral Junction: No evidence of thrombus. Normal compressibility and flow on color Doppler imaging. Profunda Femoral Vein: No evidence of thrombus. Normal compressibility and flow on color Doppler imaging. Femoral Vein: No evidence of thrombus. Normal compressibility, respiratory phasicity and response to augmentation. Popliteal Vein: No evidence of thrombus. Normal compressibility, respiratory phasicity and response to augmentation. Calf Veins: No evidence of thrombus. Normal compressibility and flow on color Doppler imaging. Superficial Great Saphenous Vein: No evidence of thrombus. Normal compressibility. Venous Reflux:  None. Other Findings:  None. LEFT LOWER EXTREMITY Common Femoral Vein: No evidence of thrombus. Normal compressibility, respiratory phasicity and response to augmentation. Saphenofemoral Junction: No evidence of thrombus. Normal compressibility and flow on color Doppler imaging. Profunda Femoral Vein:  No evidence of thrombus. Normal compressibility and flow on color Doppler imaging. Femoral Vein: No evidence of thrombus. Normal compressibility, respiratory phasicity and response to augmentation. Popliteal Vein: No evidence of thrombus. Normal compressibility, respiratory phasicity and response to augmentation. Calf Veins: No evidence of thrombus. Normal compressibility and flow on color Doppler imaging. Superficial Great Saphenous Vein: No evidence of thrombus. Normal compressibility. Venous Reflux:  None. Other Findings:  None. IMPRESSION: No evidence of deep venous thrombosis in either lower extremity. Electronically Signed   By: Bretta Bang III M.D.   On: 05/01/2020 10:17   DG Chest Port 1 View  Result Date: 04/30/2020 CLINICAL DATA:  Wheezing and coughing. EXAM: PORTABLE CHEST 1 VIEW COMPARISON:  One-view chest x-ray 04/22/2020 FINDINGS: Enteric tube was removed. Heart is enlarged. Pulmonary vascular congestion is again noted. Aeration and lung volumes are proved bilaterally. Chronic elevation of the right hemidiaphragm is noted. Mild degenerative changes are noted in the cervical spine and shoulders. IMPRESSION: 1. Stable cardiomegaly and mild pulmonary vascular congestion. 2. Improved aeration and lung volumes bilaterally. Electronically Signed   By: Marin Roberts M.D.   On: 04/30/2020 05:12   DG Chest Port 1 View  Result Date: 04/22/2020 CLINICAL DATA:  Peri op EXAM: PORTABLE CHEST 1 VIEW COMPARISON:  04/22/2020, CT 04/20/2020, MRI 04/22/2020 FINDINGS: Esophageal tube tip overlies the gastric outlet. New cutaneous staples over the right hemiabdomen. Low lung volumes. Borderline cardiomegaly. Patchy atelectasis at the bases. Resolution of previously noted free air beneath the right diaphragm. No pneumothorax. IMPRESSION: 1. Esophageal tube tip overlies the gastric outlet. 2. Low lung volumes with patchy basilar atelectasis. Resolution of previously noted free air beneath the right  diaphragm Electronically Signed   By: Jasmine Pang M.D.   On: 04/22/2020 20:08   DG CHEST PORT 1 VIEW  Result Date: 04/22/2020 CLINICAL DATA:  Pneumoperitoneum. EXAM: PORTABLE CHEST 1 VIEW COMPARISON:  04/22/2020 MR and prior studies FINDINGS: Pneumoperitoneum underlying the RIGHT hemidiaphragm is noted, identified on earlier MRI. An NG tube is present with tip overlying the distal stomach. Bilateral LOWER lung opacities/atelectasis are present. There is no evidence of pneumothorax. IMPRESSION: 1. Pneumoperitoneum underlying the RIGHT hemidiaphragm. 2. Bilateral LOWER lung opacities/atelectasis. Electronically Signed   By: Harmon Pier M.D.   On: 04/22/2020 12:43   DG Chest Port 1 View  Result Date: 04/20/2020 CLINICAL DATA:  Altered mental status. EXAM: PORTABLE CHEST 1 VIEW COMPARISON:  September 09, 2014 FINDINGS: Mild, diffuse chronic appearing increased lung markings are seen without evidence of acute infiltrate, pleural effusion or pneumothorax. The cardiac silhouette is mildly enlarged. The visualized skeletal structures are unremarkable. IMPRESSION: Chronic appearing increased lung markings without evidence of acute or active cardiopulmonary disease. Electronically Signed   By: Demetrius Revel.D.  On: 04/20/2020 21:05   DG UGI W SINGLE CM (SOL OR THIN BA)  Result Date: 04/25/2020 CLINICAL DATA:  Perforated prepyloric ulcer due to naproxen, post repair EXAM: WATER SOLUBLE UPPER GI SERIES TECHNIQUE: Single-column upper GI series was performed using water soluble contrast. CONTRAST:  ISOVUE-300 IOPAMIDOL (ISOVUE-300) INJECTION 61% orally COMPARISON:  CT abdomen and pelvis 04/20/2020 FLUOROSCOPY TIME:  Fluoroscopy Time:  1 minutes 18 seconds Radiation Exposure Index (if provided by the fluoroscopic device): 49.5 mGy Number of Acquired Spot Images: 8 + multiple fluoroscopic screen captures FINDINGS: Scout image demonstrates tip of nasogastric tube projecting over stomach. JP drain projects  over duodenal bulb, pylorus, and prepyloric region. Skin clips from upper abdominal laparotomy. Gaseous distention of stomach. Contrast opacifies the gastric lumen and with patient turned to RIGHT lateral decubitus position, empties from the stomach into duodenum. No evidence of gastric outlet obstruction. Mild bowel wall thickening of the pylorus, duodenal bulb and pre pyloric mucosa. No extraluminal contrast extravasation identified. No contrast is seen along the JP drain. IMPRESSION: Edema at the surgical region at the pylorus, pre pyloric, and duodenal bulb regions. No evidence of contrast extravasation. Electronically Signed   By: Ulyses Southward M.D.   On: 04/25/2020 10:11   MR ABDOMEN MRCP W WO CONTAST  Result Date: 04/22/2020 CLINICAL DATA:  Pancreatitis suspected, characterize pancreatic lesions, characterize incidental right renal lesion EXAM: MRI ABDOMEN WITHOUT AND WITH CONTRAST (INCLUDING MRCP) TECHNIQUE: Multiplanar multisequence MR imaging of the abdomen was performed both before and after the administration of intravenous contrast. Heavily T2-weighted images of the biliary and pancreatic ducts were obtained, and three-dimensional MRCP images were rendered by post processing. CONTRAST:  7mL GADAVIST GADOBUTROL 1 MMOL/ML IV SOLN COMPARISON:  CT abdomen pelvis, 04/20/2020 FINDINGS: Lower chest: Small right pleural effusion associated atelectasis or consolidation. Trace left pleural effusion. Hepatobiliary: No mass or other parenchymal abnormality identified. Gallstones in the gallbladder. No gallbladder wall thickening. No biliary ductal dilatation. Pancreas: There is a multicystic fluid signal lesion in the anterior pancreatic head measuring 4.1 x 2.6 x 3.0 cm (series 5, image 26, series 4, image 12). There are multiple associated small calcifications. This is clearly distinct from the pancreatic duct and there is no pancreatic ductal dilatation. No solid contrast enhancing component. Spleen:  Within  normal limits in size and appearance. Adrenals/Urinary Tract: No masses identified. Numerous bilateral renal cysts, predominantly parapelvic although also with several cortical cysts. An exophytic cyst of the superior pole of the right kidney is partially calcified and demonstrates mixed intrinsic signal, but demonstrates no contrast enhancement. Remaining cysts are simple and fluid signal without enhancement. No evidence of hydronephrosis. Stomach/Bowel: There is gastric mucosal thickening of the antrum and pylorus and a possible small ulceration of the anterior gastric antrum (series 5, image 22). Vascular/Lymphatic: No pathologically enlarged lymph nodes identified. No abdominal aortic aneurysm demonstrated. Other:  There is new moderate volume ascites and pneumoperitoneum. Musculoskeletal: No suspicious bone lesions identified. IMPRESSION: 1. There is new moderate volume ascites throughout the abdomen and pneumoperitoneum, concerning for perforated viscus organ. There is gastric mucosal thickening of the antrum and pylorus and a possible small ulceration of the anterior pylorus, suggesting a perforated gastric ulceration although nidus of perforation is not definitively localized. 2. There is a multicystic fluid signal lesion in the anterior pancreatic head measuring 4.1 cm. This is clearly distinct from the pancreatic duct and there are multiple associated small calcifications. Favor pancreatic pseudocyst although mucinous cystic neoplasm is a substantial differential consideration given  appearance. Given lesion size recommend consideration of EUS and FNA for diagnosis. 3. No pancreatic ductal dilatation. 4. Numerous bilateral renal cysts, predominantly parapelvic although also with several cortical cysts. An exophytic cyst of the superior pole of the right kidney is partially calcified and demonstrates mixed intrinsic signal, but demonstrates no contrast enhancement. This is consistent with a benign  proteinaceous or hemorrhagic cyst. 5. Cholelithiasis. No evidence of acute cholecystitis. No biliary ductal dilatation. These results were called by telephone at the time of interpretation on 04/22/2020 at 10:17 am to Dr. Sherryll Burger, who verbally acknowledged these results. Electronically Signed   By: Lauralyn Primes M.D.   On: 04/22/2020 10:31   ECHOCARDIOGRAM COMPLETE  Result Date: 05/02/2020    ECHOCARDIOGRAM REPORT   Patient Name:   JENECIA WINEGAR Date of Exam: 05/02/2020 Medical Rec #:  [id number]      Height:       64.0 in Accession #:    [id number]     Weight:       224.9 lb Date of Birth:  10/03/1955      BSA:          2.056 m Patient Age:    63 years       BP:           143/79 mmHg Patient Gender: F              HR:           71 bpm. Exam Location:  Inpatient Procedure: 2D Echo, Cardiac Doppler and Color Doppler Indications:    Pulmonary Embolus  History:        Patient has no prior history of Echocardiogram examinations.                 Risk Factors:Hypertension and Obesity. Pulmonary embolus, s/p GI                 surgery.  Sonographer:    Lavenia Atlas Referring Phys: [id number] PING T ZHANG IMPRESSIONS  1. Left ventricular ejection fraction, by estimation, is 60 to 65%. The left ventricle has normal function. The left ventricle has no regional wall motion abnormalities. Left ventricular diastolic parameters were normal. There is the interventricular septum is flattened in systole, consistent with right ventricular pressure overload.  2. Right ventricular systolic function is normal. The right ventricular size is mildly enlarged. There is moderately elevated pulmonary artery systolic pressure. The estimated right ventricular systolic pressure is 51.0 mmHg.  3. The mitral valve is normal in structure. Trivial mitral valve regurgitation. No evidence of mitral stenosis.  4. The aortic valve is tricuspid. Aortic valve regurgitation is not visualized. No aortic stenosis is present. Comparison(s): No prior  Echocardiogram. FINDINGS  Left Ventricle: Left ventricular ejection fraction, by estimation, is 60 to 65%. The left ventricle has normal function. The left ventricle has no regional wall motion abnormalities. The left ventricular internal cavity size was normal in size. There is  no left ventricular hypertrophy. The interventricular septum is flattened in systole, consistent with right ventricular pressure overload. Left ventricular diastolic parameters were normal. Right Ventricle: The right ventricular size is mildly enlarged. No increase in right ventricular wall thickness. Right ventricular systolic function is normal. There is moderately elevated pulmonary artery systolic pressure. The tricuspid regurgitant velocity is 3.28 m/s, and with an assumed right atrial pressure of 8 mmHg, the estimated right ventricular systolic pressure is 51.0 mmHg. Left Atrium: Left atrial size was normal in size. Right Atrium: Right atrial size  was normal in size. Pericardium: There is no evidence of pericardial effusion. Mitral Valve: The mitral valve is normal in structure. Trivial mitral valve regurgitation. No evidence of mitral valve stenosis. Tricuspid Valve: The tricuspid valve is normal in structure. Tricuspid valve regurgitation is trivial. No evidence of tricuspid stenosis. Aortic Valve: The aortic valve is tricuspid. Aortic valve regurgitation is not visualized. No aortic stenosis is present. Pulmonic Valve: The pulmonic valve was not well visualized. Pulmonic valve regurgitation is not visualized. Aorta: The aortic root and ascending aorta are structurally normal, with no evidence of dilitation. Venous: The inferior vena cava was not well visualized. IAS/Shunts: The atrial septum is grossly normal.  LEFT VENTRICLE PLAX 2D LVIDd:         5.10 cm  Diastology LVIDs:         3.10 cm  LV e' medial:    12.70 cm/s LV PW:         1.00 cm  LV E/e' medial:  6.7 LV IVS:        1.00 cm  LV e' lateral:   10.90 cm/s LVOT diam:      2.40 cm  LV E/e' lateral: 7.8 LV SV:         118 LV SV Index:   57 LVOT Area:     4.52 cm  RIGHT VENTRICLE RV Basal diam:  3.20 cm RV S prime:     21.30 cm/s TAPSE (M-mode): 3.8 cm LEFT ATRIUM             Index       RIGHT ATRIUM           Index LA diam:        3.70 cm 1.80 cm/m  RA Area:     15.60 cm LA Vol (A2C):   51.8 ml 25.19 ml/m RA Volume:   38.90 ml  18.92 ml/m LA Vol (A4C):   38.3 ml 18.63 ml/m LA Biplane Vol: 49.0 ml 23.83 ml/m  AORTIC VALVE LVOT Vmax:   126.00 cm/s LVOT Vmean:  84.700 cm/s LVOT VTI:    0.261 m  AORTA Ao Root diam: 2.90 cm MITRAL VALVE               TRICUSPID VALVE MV Area (PHT): 5.66 cm    TR Peak grad:   43.0 mmHg MV Decel Time: 134 msec    TR Vmax:        328.00 cm/s MV E velocity: 85.30 cm/s MV A velocity: 93.00 cm/s  SHUNTS MV E/A ratio:  0.92        Systemic VTI:  0.26 m                            Systemic Diam: 2.40 cm Jodelle Red MD Electronically signed by Jodelle Red MD Signature Date/Time: 05/02/2020/6:06:31 PM    Final    CT IMAGE GUIDED FLUID DRAIN BY CATHETER  Result Date: 05/02/2020 INDICATION: 64 year old female approximately 8 days status post open surgical repair of a perforated pre-pyloric gastric ulcer. Recent CT imaging demonstrated new accumulation of a fluid collection in the dependent anatomic pelvis anterior to the uterus and superior to the bladder. She presents for CT-guided drain placement. EXAM: CT-guided drain placement MEDICATIONS: The patient is currently admitted to the hospital and receiving intravenous antibiotics. The antibiotics were administered within an appropriate time frame prior to the initiation of the procedure. ANESTHESIA/SEDATION: Fentanyl 100 mcg IV; Versed 2.5 mg IV  Moderate Sedation Time:  27 minutes The patient was continuously monitored during the procedure by the interventional radiology nurse under my direct supervision. COMPLICATIONS: None immediate. PROCEDURE: Informed written consent was obtained from the  patient after a thorough discussion of the procedural risks, benefits and alternatives. All questions were addressed. Maximal Sterile Barrier Technique was utilized including caps, mask, sterile gowns, sterile gloves, sterile drape, hand hygiene and skin antiseptic. A timeout was performed prior to the initiation of the procedure. A planning axial CT scan was performed. The fluid collection was successfully identified. There is a very small window approaching from inferior to the sigmoid colon. A skin entry site was selected and marked. The overlying skin was sterilely prepped and draped in the standard fashion using chlorhexidine skin prep. Local anesthesia was attained by infiltration with 1% lidocaine. A small dermatotomy was made. Under intermittent CT guidance, an 18 gauge trocar needle was carefully advanced under the sigmoid colon and into the fluid collection. A 0.035 wire was coiled in the fluid collection. The skin tract was dilated to 12 [place]. A Cook 12 [place] all-purpose drainage catheter was then advanced over the wire and formed. Aspiration yields approximately 30 mL of purulent appearing fluid. The fluid was sent for Gram stain and culture. The drainage catheter was gently flushed and secured to the skin with 0 Prolene suture and an adhesive fixation device. The patient tolerated the procedure well. IMPRESSION: Successful placement of a 12 French drainage catheter into the dependent low abdominal fluid collection. Aspiration yields 30 mL of purulent appearing fluid which was sent for Gram stain and culture. PLAN: 1. Maintain drain to JP bulb suction. 2. Flush drainage catheter with 10 mL saline at least once per shift. 3. When drain output is minimal (serous and less than 15 mL per day for at least 48 hours) in the patient is clinically improved, the drainage catheter can be removed. Signed, Sterling Big, MD, RPVI [facility]  Electronically Signed   By: Malachy Moan M.D.   On: 05/02/2020 09:36    Lab Data:  CBC: Recent Labs  Lab 05/07/20 0330 05/08/20 0243 05/08/20 2056 05/09/20 0745 05/10/20 0214  WBC 7.1 7.3 7.4 6.8 7.0  NEUTROABS  --  5.3  --   --   --   HGB 11.0* 10.5* 10.8* 9.9* 10.1*  HCT 32.6* 31.9* 32.4* 29.5* 30.7*  MCV 87.6 87.2 87.6 86.8 86.7  PLT 231 213 231 211 219   Basic Metabolic Panel: Recent Labs  Lab 05/04/20 0146 05/05/20 0332 05/07/20 0330 05/08/20 0243  NA 135 135 134* 136  K 3.2* 4.0 4.2 3.6  CL 99 102 99 100  CO2 25 25 26 24   GLUCOSE 123* 138* 116* 125*  BUN 7* 7* 7* 6*  CREATININE 0.66 0.77 0.83 0.79  CALCIUM 8.0* 8.3* 8.6* 8.5*  MG  --  1.8  --   --    GFR: Estimated Creatinine Clearance: 83.6 mL/min (by C-G formula based on SCr of 0.79 mg/dL). Liver Function Tests: Recent Labs  Lab 05/07/20 0330  AST 40  ALT 22  ALKPHOS 54  BILITOT 0.6  PROT 6.7  ALBUMIN 2.4*   No results for input(s): LIPASE, AMYLASE in the last 168 hours. No results for input(s): AMMONIA in the last 168 hours. Coagulation Profile: No results for input(s): INR, PROTIME in the last 168 hours. Cardiac Enzymes: No results for input(s): CKTOTAL, CKMB, CKMBINDEX, TROPONINI in the last 168 hours. BNP (last  3 results) No results for input(s): PROBNP in the last 8760 hours. HbA1C: No results for input(s): HGBA1C in the last 72 hours. CBG: Recent Labs  Lab 05/05/20 1621  GLUCAP 133*   Lipid Profile: No results for input(s): CHOL, HDL, LDLCALC, TRIG, CHOLHDL, LDLDIRECT in the last 72 hours. Thyroid Function Tests: No results for input(s): TSH, T4TOTAL, FREET4, T3FREE, THYROIDAB in the last 72 hours. Anemia Panel: No results for input(s): VITAMINB12, FOLATE, FERRITIN, TIBC, IRON, RETICCTPCT in the last 72 hours. Urine analysis:    Component Value Date/Time   COLORURINE YELLOW 04/20/2020 2020   APPEARANCEUR CLEAR 04/20/2020 2020   LABSPEC 1.030 04/20/2020 2020   PHURINE 5.0  04/20/2020 2020   GLUCOSEU NEGATIVE 04/20/2020 2020   HGBUR NEGATIVE 04/20/2020 2020   BILIRUBINUR MODERATE (A) 04/20/2020 2020   KETONESUR NEGATIVE 04/20/2020 2020   PROTEINUR NEGATIVE 04/20/2020 2020   NITRITE NEGATIVE 04/20/2020 2020   LEUKOCYTESUR NEGATIVE 04/20/2020 2020     Khalon Cansler M.D. [facility] 05/10/2020, 1:54 PM   Call night coverage person covering after 7pm

## 2020-05-10 NOTE — Progress Notes (Signed)
ANTICOAGULATION CONSULT NOTE - [place] for Heparin Indication: pulmonary embolus  Allergies  Allergen Reactions  . Wheat Extract Swelling    Patient Measurements: Height: 5\' 4"  (162.6 cm) Weight: 102 kg (224 lb 13.9 oz) IBW/kg (Calculated) : 54.7 Heparin Dosing Weight:  78.4 kg  Vital Signs: Temp: 97.7 F (36.5 C) (09/17 1130) Temp Source: Oral (09/17 1130) BP: 149/92 (09/17 1130) Pulse Rate: 84 (09/17 1130)  Labs: Recent Labs    05/08/20 0243 05/08/20 0243 05/08/20 1129 05/08/20 2056 05/09/20 0745 05/09/20 1440 05/10/20 0214 05/10/20 1040  HGB 10.5*   < >  --  10.8* 9.9*  --  10.1*  --   HCT 31.9*  --   --  32.4* 29.5*  --  30.7*  --   PLT 213  --   --  231 211  --  219  --   APTT 136*   < >   < >  --  94* 74* 116*  --   HEPARINUNFRC >2.20*   < >  --   --  0.35  --  0.28* 0.35  CREATININE 0.79  --   --   --   --   --   --   --    < > = values in this interval not displayed.    Estimated Creatinine Clearance: 83.6 mL/min (by C-G formula based on SCr of 0.79 mg/dL).   Assessment:  Heparin>Xarelto>heparin for submassive acute pulmonary embolism/ saddle with multiple bilateral embolus/per CTA 9/8; dopplers negative for DVT on 9/8.  - S/P peptic ulcer perforation - 9/14: surgery recommended stopping Xarelto (last dose 9/13 at 1637) and resuming heparin in preparation for surgery. Due to recent Xarelto exposure, will monitor anticoagulation using aPTT until aPTT and heparin levels correlate.  On 9/14, pt underwent CT aspiration of small sub-hepatic fluid collection. Per IR note, there remains residual fluid around existing L pelvin drain despite recent minimal input; pt had image-guided drain injection/exchange/upsizing on 9/16.  HL is on the lower end of therapeutic at 0.35. CBC stable. No issues with IV or bleeding noted.   Goal of Therapy:  Heparin level 0.3-0.7 units/ml aPTT 66-102 seconds Monitor platelets by anticoagulation  protocol: Yes   Plan:  Increase heparin to 1150 units/hr Monitor daily heparin level, CBC Monitor for signs/symptoms of bleeding F/u restart DOAC   Thank you for allowing [place] to participate in this patients care.   Jens Som, PharmD Please see amion for complete clinical pharmacist phone list. 05/10/2020 12:15 PM

## 2020-05-10 NOTE — Progress Notes (Signed)
ANTICOAGULATION CONSULT NOTE - Follow Up Consult  Pharmacy Consult for heparin Indication: pulmonary embolus  Labs: Recent Labs    05/08/20 0243 05/08/20 0243 05/08/20 1129 05/08/20 1757 05/08/20 2056 05/08/20 2056 05/09/20 0745 05/09/20 1440 05/10/20 0214  HGB 10.5*   < >  --   --  10.8*   < > 9.9*  --  10.1*  HCT 31.9*   < >  --   --  32.4*  --  29.5*  --  30.7*  PLT 213   < >  --   --  231  --  211  --  219  APTT 136*  --    < > 41*  --   --  94* 74*  --   HEPARINUNFRC >2.20*  --   --   --   --   --  0.35  --  0.28*  CREATININE 0.79  --   --   --   --   --   --   --   --    < > = values in this interval not displayed.    Assessment: 64yo female subtherapeutic on heparin after two PTTs at goal, likely d/t Xarelto clearing; no gtt issues or signs of bleeding per RN.  Goal of Therapy:  Heparin level 0.3-0.7 units/ml   Plan:  Will increase heparin gtt by 1 unit/kg/hr to 1100 units/hr and check level in 6 hours.    Wynona Neat, PharmD, BCPS  05/10/2020,3:33 AM

## 2020-05-10 NOTE — Progress Notes (Signed)
Physical Therapy Treatment Patient Details Name: Maria Murphy MRN: [id number] DOB: 1956/07/20 Today's Date: 05/10/2020    History of Present Illness Pt adm to [facility] on 8/29 with biliary pancreatitis, chronic cholecystitis. Pt found to have gastric perforation and underwent emergent laparotomy for repair. On 9/7 pt found to have pelvic abscesses and drain placed. 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 Transferred to [facility] on 9/8.  PMH - HTN, foot ulcer, memory loss, obesity.    PT Comments    Pt still hurting, but more able to work through the pain, assist in the rolling, scooting to EOB with her UE assist.  Good effort during sit to stand and progressing her gait with the RW.    Follow Up Recommendations  SNF     Equipment Recommendations  Rolling walker with 5" wheels    Recommendations for Other Services       Precautions / Restrictions Precautions Precautions: Fall Precaution Comments: painful abdominal wound, premedicate    Mobility  Bed Mobility Overal bed mobility: Needs Assistance Bed Mobility: Rolling;Sidelying to Sit Rolling: Mod assist Sidelying to sit: Mod assist       General bed mobility comments: pt still needing cues for technique and LE/trunk assist for roll and coming up from L elbow.  Transfers Overall transfer level: Needs assistance Equipment used: Rolling walker (2 wheeled) Transfers: Sit to/from Stand Sit to Stand: Mod assist;+2 safety/equipment         General transfer comment: cues for hand placement, assist to rise and steady  Ambulation/Gait Ambulation/Gait assistance: Min assist;Mod assist;+2 safety/equipment Gait Distance (Feet): 13 Feet Assistive device: Rolling walker (2 wheeled) Gait Pattern/deviations: Step-through pattern;Decreased step length - right;Decreased stance time - right;Decreased step length - left;Decreased stride length Gait velocity: decreased Gait velocity interpretation: <1.31 ft/sec, indicative of  household ambulator General Gait Details: Continues to use low-amplitude steps, but longer and more decisive.,   Stairs             Wheelchair Mobility    Modified Rankin (Stroke Patients Only)       Balance   Sitting-balance support: Feet supported;No upper extremity supported Sitting balance-Leahy Scale: Fair     Standing balance support: During functional activity;Bilateral upper extremity supported Standing balance-Leahy Scale: Poor Standing balance comment: reliant on B UE and external support, but with upright posture and significantly more control of her gaitl                            Cognition Arousal/Alertness: Awake/alert Behavior During Therapy: WFL for tasks assessed/performed Overall Cognitive Status: Within Functional Limits for tasks assessed                                        Exercises Other Exercises Other Exercises: warm up hip/knee flex/ext ROM with graded resistance in extension.    General Comments        Pertinent Vitals/Pain Pain Assessment: 0-10 Pain Score: 7  Pain Location: abdomen with mobility Pain Descriptors / Indicators: Grimacing;Guarding Pain Intervention(s): Monitored during session    Home Living                      Prior Function            PT Goals (current goals can now be found in the care plan section) Acute  Rehab PT Goals PT Goal Formulation: With patient Time For Goal Achievement: 05/23/20 Potential to Achieve Goals: Fair Progress towards PT goals: Progressing toward goals    Frequency    Min 2X/week      PT Plan Current plan remains appropriate    Co-evaluation              AM-PAC PT "6 Clicks" Mobility   Outcome Measure  Help needed turning from your back to your side while in a flat bed without using bedrails?: A Lot Help needed moving from lying on your back to sitting on the side of a flat bed without using bedrails?: A Lot Help needed moving  to and from a bed to a chair (including a wheelchair)?: A Lot Help needed standing up from a chair using your arms (e.g., wheelchair or bedside chair)?: A Lot Help needed to walk in hospital room?: A Lot Help needed climbing 3-5 steps with a railing? : Total 6 Click Score: 11    End of Session   Activity Tolerance: Patient limited by pain Patient left: in chair;with call bell/phone within reach;with chair alarm set Nurse Communication: Mobility status PT Visit Diagnosis: Unsteadiness on feet (R26.81);Other abnormalities of gait and mobility (R26.89);Muscle weakness (generalized) (M62.81)     Time: 6213-0865 PT Time Calculation (min) (ACUTE ONLY): 26 min  Charges:  $Gait Training: 8-22 mins $Therapeutic Activity: 8-22 mins                     05/10/2020  Jacinto Halim., PT Acute Rehabilitation Services [phone]  (pager) [phone]  (office)   Eliseo Gum Francess Mullen 05/10/2020, 12:29 PM

## 2020-05-11 LAB — COMPREHENSIVE METABOLIC PANEL
ALT: 25 U/L (ref 0–44)
AST: 60 U/L — ABNORMAL HIGH (ref 15–41)
Albumin: 2.5 g/dL — ABNORMAL LOW (ref 3.5–5.0)
Alkaline Phosphatase: 51 U/L (ref 38–126)
Anion gap: 9 (ref 5–15)
BUN: 6 mg/dL — ABNORMAL LOW (ref 8–23)
CO2: 24 mmol/L (ref 22–32)
Calcium: 8.6 mg/dL — ABNORMAL LOW (ref 8.9–10.3)
Chloride: 103 mmol/L (ref 98–111)
Creatinine, Ser: 0.69 mg/dL (ref 0.44–1.00)
GFR calc Af Amer: >60 mL/min (ref >60)
GFR calc non Af Amer: >60 mL/min (ref >60)
Glucose, Bld: 120 mg/dL — ABNORMAL HIGH (ref 70–99)
Potassium: 3.6 mmol/L (ref 3.5–5.1)
Sodium: 136 mmol/L (ref 135–145)
Total Bilirubin: 0.5 mg/dL (ref 0.3–1.2)
Total Protein: 6.5 g/dL (ref 6.5–8.1)

## 2020-05-11 LAB — CBC
HCT: 31.7 % — ABNORMAL LOW (ref 36.0–46.0)
Hemoglobin: 10.4 g/dL — ABNORMAL LOW (ref 12.0–15.0)
MCH: 28.7 pg (ref 26.0–34.0)
MCHC: 32.8 g/dL (ref 30.0–36.0)
MCV: 87.6 fL (ref 80.0–100.0)
Platelets: 200 10*3/uL (ref 150–400)
RBC: 3.62 MIL/uL — ABNORMAL LOW (ref 3.87–5.11)
RDW: 13.7 % (ref 11.5–15.5)
WBC: 7.1 10*3/uL (ref 4.0–10.5)
nRBC: 0 % (ref 0.0–0.2)

## 2020-05-11 LAB — HEPARIN LEVEL (UNFRACTIONATED): Heparin Unfractionated: 0.48 IU/mL (ref 0.30–0.70)

## 2020-05-11 NOTE — Progress Notes (Signed)
[facility] Progress Note  19 Days Post-Op  Subjective: No acute complaints. Having bowel function and tolerating diet.  Objective: Vital signs in last 24 hours: Temp:  [98.3 F (36.8 C)-99.1 F (37.3 C)] 98.3 F (36.8 C) (09/18 0700) Pulse Rate:  [83-86] 84 (09/18 0700) Resp:  [18-22] 19 (09/18 0700) BP: (135-164)/(86-96) 145/86 (09/18 0700) SpO2:  [96 %-99 %] 96 % (09/18 0700) Last BM Date: 05/10/20  Intake/Output from previous day: 09/17 0701 - 09/18 0700 In: 240 [P.O.:240] Out: 1175 [Urine:1165; Drains:10] Intake/Output this shift: No intake/output data recorded.  PE: General: pleasant, NAD HEENT: Sclera are noninjected. PERRL. Ears and nose without any masses or lesions. Mouth is pink and moist Lungs: Respiratory effort nonlabored Abd: soft, nondistended,LLQ drain serosanguinous Extremities: warm and well-perfused   Lab Results:  Recent Labs    05/10/20 0214 05/11/20 0159  WBC 7.0 7.1  HGB 10.1* 10.4*  HCT 30.7* 31.7*  PLT 219 200   BMET Recent Labs    05/11/20 0159  NA 136  K 3.6  CL 103  CO2 24  GLUCOSE 120*  BUN 6*  CREATININE 0.69  CALCIUM 8.6*   PT/INR No results for input(s): LABPROT, INR in the last 72 hours. CMP     Component Value Date/Time   NA 136 05/11/2020 0159   K 3.6 05/11/2020 0159   CL 103 05/11/2020 0159   CO2 24 05/11/2020 0159   GLUCOSE 120 (H) 05/11/2020 0159   BUN 6 (L) 05/11/2020 0159   CREATININE 0.69 05/11/2020 0159   CALCIUM 8.6 (L) 05/11/2020 0159   PROT 6.5 05/11/2020 0159   ALBUMIN 2.5 (L) 05/11/2020 0159   AST 60 (H) 05/11/2020 0159   ALT 25 05/11/2020 0159   ALKPHOS 51 05/11/2020 0159   BILITOT 0.5 05/11/2020 0159   GFRNONAA >60 05/11/2020 0159   GFRAA >60 05/11/2020 0159   Lipase     Component Value Date/Time   LIPASE 33 04/21/2020 1835       Studies/Results: No results found.  Anti-infectives: Anti-infectives (From admission, onward)   Start     Dose/Rate Route Frequency  Ordered Stop   05/07/20 1000  fluconazole (DIFLUCAN) IVPB 400 mg        400 mg 100 mL/hr over 120 Minutes Intravenous Every 24 hours 05/06/20 2353 05/14/20 0959   05/07/20 0000  piperacillin-tazobactam (ZOSYN) IVPB 3.375 g  Status:  Discontinued        3.375 g 100 mL/hr over 30 Minutes Intravenous Every 8 hours 05/06/20 2353 05/06/20 2355   05/07/20 0000  piperacillin-tazobactam (ZOSYN) IVPB 3.375 g        3.375 g 12.5 mL/hr over 240 Minutes Intravenous Every 8 hours 05/06/20 2356 05/13/20 2359   05/06/20 1600  piperacillin-tazobactam (ZOSYN) IVPB 3.375 g        3.375 g 100 mL/hr over 30 Minutes Intravenous  Once 05/06/20 0947 05/06/20 1708   04/26/20 1000  fluconazole (DIFLUCAN) IVPB 400 mg        400 mg 100 mL/hr over 120 Minutes Intravenous Every 24 hours 04/26/20 0907 05/06/20 2359   04/22/20 0100  piperacillin-tazobactam (ZOSYN) IVPB 3.375 g  Status:  Discontinued        3.375 g 12.5 mL/hr over 240 Minutes Intravenous Every 8 hours 04/22/20 0002 05/06/20 0946   04/22/20 0000  piperacillin-tazobactam (ZOSYN) IVPB 3.375 g  Status:  Discontinued        3.375 g 100 mL/hr over 30 Minutes Intravenous Every 8 hours 04/21/20 2349 04/22/20  0002       Assessment/Plan HTN Depression Acute PE - heparin gtt  S/P exploratory laparotomy and Graham patch for perforated gastric ulcer by Dr. Arnoldo Morale 8/30 Pelvic abscess s/p IR drainage New RUQ collection around site of prior ulcer repair - concern for possible anastomotic leak although no contrast extrav noted on CT - s/p IR aspiration of fluid 9/14, cultures with E coli - continue Zosyn - daily iodoform packing to midline wound - Recommend repeat CT with PO and IV contrast tomorrow to evaluate if collection has reaccumulated  FEN: soft diet  VTE: heparin gtt ID: [email or web address]  Follow up: Dr. Arnoldo Morale  LOS: 44 days    Dwan Bolt , [facility] 05/11/2020, 11:44 AM Please see Amion for pager number during day  hours 7:00am-4:30pm

## 2020-05-11 NOTE — Progress Notes (Signed)
PROGRESS NOTE    Maria Murphy  WHQ:[id number] DOB: Feb 03, 1956 DOA: 04/21/2020 PCP: Rosita Fire, MD   Brief Narrative:  64 year old female, hospitalizedto [facility] on[facility],with the working diagnosis biliary pancreatitis,chronic cholecystitis/cholelithiasis. Patient underwent further work-up with MRCP, which showed free fluid in the abdomen and pneumoperitoneum. Patient was placed on broad-spectrum antibiotic therapy and vasopressors, she underwent emergent laparotomy. Patient was found to have a perforation in the prepyloric area along the lesser curvature of the stomach and antrum, and a Phillip Heal application was performed. Surgical culture positive for Enterococcus and Candida. Patient was stabilized with antibiotic therapy,butshe has developed worsening leukocytosis.  A CT of the abdomen pelvis on 9/7 showed a new 5.3 x 14 x 3.9 cm abscess in the pelvis,5.1 x 2.6 x 2.0 subcapsular fluid collection along the right inferior liver, also concerning for abscess. CT of the chest with multifocal pulmonary embolus, pulmonary emboli arising in the saddledistribution from each main pulmonary artery with extension into multiple lower lobe point arteries bilaterally. Positive right heart strain,consistent with submassive. She underwent 12 F drain placement into the low abdominopelvic fluid collection. Culture positive for enterococcus and candida. Patient has been placed on heparin drip. Echo with preserved RV function  Follow up CT abdomen and pelvis (09/13), after completing 2 weeks of antibiotic therapy with development of 3.4x1.8x.19 right upper abdominal gas and fluid collection in the area of prior pre-pyloric gastric ulcer perforation. New subhepatic abscess, with cutaneous fistula General surgery, IR reconsulted.  s/p IR aspiration of the fluid 9/14.  cultures growing E. coli she is continued on Zosyn,  General surgery recommended repeat CT scan with p.o. and IV contrast tomorrow to  evaluate if collection is re-accumulated.  Assessment & Plan:   Principal Problem:   Perforated abdominal viscus Active Problems:   Essential hypertension, benign   Hyperbilirubinemia   Epigastric pain   Perforated viscus   Peptic ulcer with perforation (HCC)   Hypotension   Acute saddle pulmonary embolism without acute cor pulmonale (HCC)   Acute submassive pulmonary embolism/saddle embolism with multiple bilateral embolus/acute hypoxic respiratory failure -2D echo with preserved LV and RV function. -Patient was placed on IV heparin drip, subsequently transitioned to Xarelto. -Xarelto held due to new abdominal collection/abscess and patient was changed back to heparin drip  -Continue heparin drip per pharmacy protocol, until interventions completed and safe to resume per surgery. -General surgery recommended repeat CT scan with p.o. and IV contrast tomorrow to evaluate if collection is re-accumulated.   Active Problems: Perforated gastric ulcer status post ex lap and Graham patch by Dr. Arnoldo Morale 8/30 Pelvic abscess status post IR drainage, cultures Enterococcus, Candida -General surgery and IR following, status post IR aspiration of the fluid 9/14, cultures positive for E. coli, Candida tropicalis -Per surgery, if collection reaccumulate, would be more suggestive of leak, remains high risk for peritonitis.   Repeat CT with PO and IV contrast on Sunday.  -Continue dressing change, mobilize, underwent exchange of abdominal drain by IR on 9/16 -Continue IV Zosyn, fluconazole, PPI.   Essential hypertension BP remains elevated.  Continue Home BP meds.  History of depression Currently stable.  Hypokalemia - Improved. -Follow BMET in a.m.  Constipation -Holding Colace and Dulcolax due to diarrhea on 9/16.   Obesity Estimated body mass index is 38.6 kg/m as calculated from the following:   Height as of this encounter: 5\' 4"  (1.626 m).   Weight as of this encounter:  102 kg.    DVT prophylaxis: Heparin gtt. Code Status: Full Family  Communication: No one at bed side. Disposition Plan:  Status is: Inpatient  Remains inpatient appropriate because:IV treatments appropriate due to intensity of illness or inability to take PO   Dispo: The patient is from: Home              Anticipated d/c is to: SNF              Anticipated d/c date is: 3 days              Patient currently is not medically stable to d/c.      Consultants:    General surgery  Infectious disease  Interventional radiology.  Procedures: Laparotomy, IR attempted drainage. Antimicrobials: Anti-infectives (From admission, onward)   Start     Dose/Rate Route Frequency Ordered Stop   05/07/20 1000  fluconazole (DIFLUCAN) IVPB 400 mg        400 mg 100 mL/hr over 120 Minutes Intravenous Every 24 hours 05/06/20 2353 05/14/20 0959   05/07/20 0000  piperacillin-tazobactam (ZOSYN) IVPB 3.375 g  Status:  Discontinued        3.375 g 100 mL/hr over 30 Minutes Intravenous Every 8 hours 05/06/20 2353 05/06/20 2355   05/07/20 0000  piperacillin-tazobactam (ZOSYN) IVPB 3.375 g        3.375 g 12.5 mL/hr over 240 Minutes Intravenous Every 8 hours 05/06/20 2356 05/13/20 2359   05/06/20 1600  piperacillin-tazobactam (ZOSYN) IVPB 3.375 g        3.375 g 100 mL/hr over 30 Minutes Intravenous  Once 05/06/20 0947 05/06/20 1708   04/26/20 1000  fluconazole (DIFLUCAN) IVPB 400 mg        400 mg 100 mL/hr over 120 Minutes Intravenous Every 24 hours 04/26/20 0907 05/06/20 2359   04/22/20 0100  piperacillin-tazobactam (ZOSYN) IVPB 3.375 g  Status:  Discontinued        3.375 g 12.5 mL/hr over 240 Minutes Intravenous Every 8 hours 04/22/20 0002 05/06/20 0946   04/22/20 0000  piperacillin-tazobactam (ZOSYN) IVPB 3.375 g  Status:  Discontinued        3.375 g 100 mL/hr over 30 Minutes Intravenous Every 8 hours 04/21/20 2349 04/22/20 0002      Subjective: Patient was seen and examined at bedside.   Overnight events noted.  She reports feeling better. she had slight pinkish color fluid in the drain.  She reports pain is better controlled, denies nausea vomiting and diarrhea.  Objective: Vitals:   05/10/20 2100 05/11/20 0400 05/11/20 0700 05/11/20 1242  BP: (!) 164/96 135/86 (!) 145/86 (!) 149/86  Pulse: 86 85 84 79  Resp: (!) 22 18 19 20   Temp: 99.1 F (37.3 C) 98.5 F (36.9 C) 98.3 F (36.8 C) 98.3 F (36.8 C)  TempSrc: Oral Oral Oral Oral  SpO2: 96% 96% 96% 96%  Weight:      Height:        Intake/Output Summary (Last 24 hours) at 05/11/2020 1517 Last data filed at 05/10/2020 2100 Gross per 24 hour  Intake --  Output 435 ml  Net -435 ml   Filed Weights   04/21/20 1352 04/22/20 0037 04/25/20 0424  Weight: 90.7 kg 90.5 kg 102 kg    Examination:  General exam: Appears calm and comfortable, Alert, oriented x 3 Respiratory system: Clear to auscultation. Respiratory effort normal. Cardiovascular system: S1 & S2 heard, RRR. No JVD, murmurs, rubs, gallops or clicks. No pedal edema. Gastrointestinal system: Abdomen is nondistended, soft and  Mildly tender in RUQ. No organomegaly or masses felt.  Normal bowel sounds heard. Central nervous system: Alert and oriented. No focal neurological deficits. Extremities: No edema, no clubbing, no cyanosis. Skin: No rashes, lesions or ulcers Psychiatry: Judgement and insight appear normal. Mood & affect appropriate.     Data Reviewed: I have personally reviewed following labs and imaging studies  CBC: Recent Labs  Lab 05/08/20 0243 05/08/20 2056 05/09/20 0745 05/10/20 0214 05/11/20 0159  WBC 7.3 7.4 6.8 7.0 7.1  NEUTROABS 5.3  --   --   --   --   HGB 10.5* 10.8* 9.9* 10.1* 10.4*  HCT 31.9* 32.4* 29.5* 30.7* 31.7*  MCV 87.2 87.6 86.8 86.7 87.6  PLT 213 231 211 219 045   Basic Metabolic Panel: Recent Labs  Lab 05/05/20 0332 05/07/20 0330 05/08/20 0243 05/11/20 0159  NA 135 134* 136 136  K 4.0 4.2 3.6 3.6  CL 102 99  100 103  CO2 25 26 24 24   GLUCOSE 138* 116* 125* 120*  BUN 7* 7* 6* 6*  CREATININE 0.77 0.83 0.79 0.69  CALCIUM 8.3* 8.6* 8.5* 8.6*  MG 1.8  --   --   --    GFR: Estimated Creatinine Clearance: 83.6 mL/min (by C-G formula based on SCr of 0.69 mg/dL). Liver Function Tests: Recent Labs  Lab 05/07/20 0330 05/11/20 0159  AST 40 60*  ALT 22 25  ALKPHOS 54 51  BILITOT 0.6 0.5  PROT 6.7 6.5  ALBUMIN 2.4* 2.5*   No results for input(s): LIPASE, AMYLASE in the last 168 hours. No results for input(s): AMMONIA in the last 168 hours. Coagulation Profile: No results for input(s): INR, PROTIME in the last 168 hours. Cardiac Enzymes: No results for input(s): CKTOTAL, CKMB, CKMBINDEX, TROPONINI in the last 168 hours. BNP (last 3 results) No results for input(s): PROBNP in the last 8760 hours. HbA1C: No results for input(s): HGBA1C in the last 72 hours. CBG: Recent Labs  Lab 05/05/20 1621  GLUCAP 133*   Lipid Profile: No results for input(s): CHOL, HDL, LDLCALC, TRIG, CHOLHDL, LDLDIRECT in the last 72 hours. Thyroid Function Tests: No results for input(s): TSH, T4TOTAL, FREET4, T3FREE, THYROIDAB in the last 72 hours. Anemia Panel: No results for input(s): VITAMINB12, FOLATE, FERRITIN, TIBC, IRON, RETICCTPCT in the last 72 hours. Sepsis Labs: No results for input(s): PROCALCITON, LATICACIDVEN in the last 168 hours.  Recent Results (from the past 240 hour(s))  SARS Coronavirus 2 by RT PCR (hospital order, performed in [facility] hospital lab) Nasopharyngeal Nasopharyngeal Swab     Status: None   Collection Time: 05/05/20  6:00 PM   Specimen: Nasopharyngeal Swab  Result Value Ref Range Status   SARS Coronavirus 2 NEGATIVE NEGATIVE Final    Comment: (NOTE) SARS-CoV-2 target nucleic acids are NOT DETECTED.  The SARS-CoV-2 RNA is generally detectable in upper and lower respiratory specimens during the acute phase of infection. The lowest concentration of SARS-CoV-2 viral copies  this assay can detect is 250 copies / mL. A negative result does not preclude SARS-CoV-2 infection and should not be used as the sole basis for treatment or other patient management decisions.  A negative result may occur with improper specimen collection / handling, submission of specimen other than nasopharyngeal swab, presence of viral mutation(s) within the areas targeted by this assay, and inadequate number of viral copies (<250 copies / mL). A negative result must be combined with clinical observations, patient history, and epidemiological information.  Fact Sheet for Patients:   StrictlyIdeas.no  Fact Sheet for Healthcare Providers: BankingDealers.co.za  This test is not yet approved or  cleared by the [place] and has been authorized for detection and/or diagnosis of SARS-CoV-2 by FDA under an Emergency Use Authorization (EUA).  This EUA will remain in effect (meaning this test can be used) for the duration of the COVID-19 declaration under Section 564(b)(1) of the Act, 21 U.S.C. section 360bbb-3(b)(1), unless the authorization is terminated or revoked sooner.  Performed at [facility], [place] [address], [place], [place] [address]   Aerobic/Anaerobic Culture (surgical/deep wound)     Status: None (Preliminary result)   Collection Time: 05/07/20  5:22 PM   Specimen: Abdomen; Abscess  Result Value Ref Range Status   Specimen Description ABDOMEN ABSCESS  Final   Special Requests   Final    SUB HEPATIC FLUID NEAR PRIOR GASTRIC ULCER PERFORATION   Gram Stain   Final    ABUNDANT WBC PRESENT, PREDOMINANTLY PMN NO ORGANISMS SEEN Performed at [facility], [address] [place], [place] [address]    Culture   Final    RARE ESCHERICHIA COLI FEW CANDIDA TROPICALIS NO ANAEROBES ISOLATED; CULTURE IN PROGRESS FOR 5 DAYS    Report Status PENDING  Incomplete   Organism ID, Bacteria ESCHERICHIA COLI  Final       Susceptibility   Escherichia coli - MIC*    AMPICILLIN >=32 RESISTANT Resistant     CEFAZOLIN 16 SENSITIVE Sensitive     CEFEPIME <=0.12 SENSITIVE Sensitive     CEFTAZIDIME <=1 SENSITIVE Sensitive     CEFTRIAXONE <=0.25 SENSITIVE Sensitive     CIPROFLOXACIN <=0.25 SENSITIVE Sensitive     GENTAMICIN <=1 SENSITIVE Sensitive     IMIPENEM <=0.25 SENSITIVE Sensitive     TRIMETH/SULFA <=20 SENSITIVE Sensitive     AMPICILLIN/SULBACTAM >=32 RESISTANT Resistant     PIP/TAZO <=4 SENSITIVE Sensitive     * RARE ESCHERICHIA COLI  Surgical PCR screen     Status: None   Collection Time: 05/08/20 10:33 PM   Specimen: Nasal Mucosa; Nasal Swab  Result Value Ref Range Status   MRSA, PCR NEGATIVE NEGATIVE Final   Staphylococcus aureus NEGATIVE NEGATIVE Final    Comment: (NOTE) The Xpert SA Assay (FDA approved for NASAL specimens in patients 42 years of age and older), is one component of a comprehensive surveillance program. It is not intended to diagnose infection nor to guide or monitor treatment. Performed at [facility], [place] [address] [place], [place] [address]      Radiology Studies: No results found.  Scheduled Meds: . Chlorhexidine Gluconate Cloth  6 each Topical Daily  . feeding supplement (ENSURE ENLIVE)  237 mL Oral BID BM  . Gerhardt's butt cream   Topical Daily  . multivitamin with minerals  1 tablet Oral Daily  . pantoprazole (PROTONIX) IV  40 mg Intravenous Q12H  . sodium chloride flush  10 mL Intracatheter Q8H  . sodium chloride flush  5 mL Intracatheter Q8H   Continuous Infusions: . sodium chloride 10 mL/hr at 05/07/20 1600  . dextrose 5% lactated ringers 75 mL/hr at 05/08/20 1617  . fluconazole (DIFLUCAN) IV 400 mg (05/11/20 0952)  . heparin 1,150 Units/hr (05/11/20 0802)  . piperacillin-tazobactam (ZOSYN)  IV 3.375 g (05/11/20 0807)     LOS: 19 days    Time spent: 35 mins.    Shawna Clamp, MD [facility]   If 7PM-7AM, please contact  night-coverage

## 2020-05-11 NOTE — Progress Notes (Signed)
ANTICOAGULATION CONSULT NOTE - Follow-Up Consult  Pharmacy Consult for Heparin Indication: pulmonary embolus  Allergies  Allergen Reactions   Wheat Extract Swelling    Patient Measurements: Height: 5\' 4"  (162.6 cm) Weight: 102 kg (224 lb 13.9 oz) IBW/kg (Calculated) : 54.7 Heparin Dosing Weight:  78.4 kg  Vital Signs: Temp: 98.5 F (36.9 C) (09/18 0400) Temp Source: Oral (09/18 0400) BP: 135/86 (09/18 0400) Pulse Rate: 85 (09/18 0400)  Labs: Recent Labs    05/09/20 0745 05/09/20 0745 05/09/20 1440 05/10/20 0214 05/10/20 1040 05/11/20 0159  HGB 9.9*   < >  --  10.1*  --  10.4*  HCT 29.5*  --   --  30.7*  --  31.7*  PLT 211  --   --  219  --  200  APTT 94*  --  74* 116*  --   --   HEPARINUNFRC 0.35   < >  --  0.28* 0.35 0.48  CREATININE  --   --   --   --   --  0.69   < > = values in this interval not displayed.    Estimated Creatinine Clearance: 83.6 mL/min (by C-G formula based on SCr of 0.69 mg/dL).   Assessment:  Heparin>Xarelto>heparin for submassive acute pulmonary embolism/ saddle with multiple bilateral embolus/per CTA 9/8; dopplers negative for DVT on 9/8.  - S/P peptic ulcer perforation - 9/14: surgery recommended stopping Xarelto (last dose 9/13 at 1637) and resuming heparin in preparation for surgery. Due to recent Xarelto exposure, will monitor anticoagulation using aPTT until aPTT and heparin levels correlate.  On 9/14, pt underwent CT aspiration of small sub-hepatic fluid collection. Per IR note, there remains residual fluid around existing L pelvin drain despite recent minimal input; pt had image-guided drain injection/exchange/upsizing on 9/16.  HL is therapeutic at 0.48. CBC stable. No issues with IV or bleeding noted.   Goal of Therapy:  Heparin level 0.3-0.7 units/ml aPTT 66-102 seconds Monitor platelets by anticoagulation protocol: Yes   Plan:  Continue IV Heparin at 1150 units/hr Monitor daily heparin level, CBC Monitor for  signs/symptoms of bleeding F/u restart DOAC   Thank you for allowing [place] to participate in this patients care.   Norina Buzzard, PharmD PGY1 Pharmacy Resident 05/11/2020 7:17 AM  Please see amion for complete clinical pharmacist phone list.

## 2020-05-12 LAB — COMPREHENSIVE METABOLIC PANEL
ALT: 23 U/L (ref 0–44)
AST: 41 U/L (ref 15–41)
Albumin: 2.5 g/dL — ABNORMAL LOW (ref 3.5–5.0)
Alkaline Phosphatase: 43 U/L (ref 38–126)
Anion gap: 9 (ref 5–15)
BUN: 6 mg/dL — ABNORMAL LOW (ref 8–23)
CO2: 23 mmol/L (ref 22–32)
Calcium: 8.6 mg/dL — ABNORMAL LOW (ref 8.9–10.3)
Chloride: 104 mmol/L (ref 98–111)
Creatinine, Ser: 0.72 mg/dL (ref 0.44–1.00)
GFR calc Af Amer: >60 mL/min (ref >60)
GFR calc non Af Amer: >60 mL/min (ref >60)
Glucose, Bld: 125 mg/dL — ABNORMAL HIGH (ref 70–99)
Potassium: 3.2 mmol/L — ABNORMAL LOW (ref 3.5–5.1)
Sodium: 136 mmol/L (ref 135–145)
Total Bilirubin: 0.5 mg/dL (ref 0.3–1.2)
Total Protein: 6.5 g/dL (ref 6.5–8.1)

## 2020-05-12 LAB — PHOSPHORUS: Phosphorus: 3.6 mg/dL (ref 2.5–4.6)

## 2020-05-12 LAB — CBC
HCT: 30.7 % — ABNORMAL LOW (ref 36.0–46.0)
Hemoglobin: 10 g/dL — ABNORMAL LOW (ref 12.0–15.0)
MCH: 28.4 pg (ref 26.0–34.0)
MCHC: 32.6 g/dL (ref 30.0–36.0)
MCV: 87.2 fL (ref 80.0–100.0)
Platelets: 189 10*3/uL (ref 150–400)
RBC: 3.52 MIL/uL — ABNORMAL LOW (ref 3.87–5.11)
RDW: 14 % (ref 11.5–15.5)
WBC: 6.8 10*3/uL (ref 4.0–10.5)
nRBC: 0 % (ref 0.0–0.2)

## 2020-05-12 LAB — AEROBIC/ANAEROBIC CULTURE W GRAM STAIN (SURGICAL/DEEP WOUND)

## 2020-05-12 LAB — HEPARIN LEVEL (UNFRACTIONATED): Heparin Unfractionated: 0.5 IU/mL (ref 0.30–0.70)

## 2020-05-12 LAB — MAGNESIUM: Magnesium: 1.7 mg/dL (ref 1.7–2.4)

## 2020-05-12 MED ORDER — DOCUSATE SODIUM 100 MG PO CAPS: 100.0000 mg | ORAL_CAPSULE | Freq: Every day | ORAL | Status: DC | PRN

## 2020-05-12 MED ORDER — POTASSIUM CHLORIDE CRYS ER 20 MEQ PO TBCR
20.0000 meq | EXTENDED_RELEASE_TABLET | Freq: Two times a day (BID) | ORAL | Status: AC
  Administered 2020-05-12 (×2): 20 meq via ORAL
  Filled 2020-05-12 (×2): qty 1

## 2020-05-12 NOTE — Progress Notes (Signed)
20 Days Post-Op   Subjective/Chief Complaint: Has been eating. Reports a pork chop did not agree with her but has been eating other things.   Objective: Vital signs in last 24 hours: Temp:  [98.3 F (36.8 C)-99.6 F (37.6 C)] 98.3 F (36.8 C) (09/19 0309) Pulse Rate:  [78-99] 86 (09/19 0400) Resp:  [14-21] 21 (09/19 0400) BP: (133-157)/(78-93) 133/78 (09/19 0400) SpO2:  [96 %-98 %] 96 % (09/19 0400) Last BM Date: 05/11/20  Intake/Output from previous day: 09/18 0701 - 09/19 0700 In: 1169.9 [P.O.:360; I.V.:759.9; IV Piggyback:50] Out: 1560 [Urine:1550; Drains:10] Intake/Output this shift: No intake/output data recorded.  GI: soft, NT, drain SS  Lab Results:  Recent Labs    05/11/20 0159 05/12/20 0707  WBC 7.1 6.8  HGB 10.4* 10.0*  HCT 31.7* 30.7*  PLT 200 189   BMET Recent Labs    05/11/20 0159 05/12/20 0707  NA 136 136  K 3.6 3.2*  CL 103 104  CO2 24 23  GLUCOSE 120* 125*  BUN 6* 6*  CREATININE 0.69 0.72  CALCIUM 8.6* 8.6*   PT/INR No results for input(s): LABPROT, INR in the last 72 hours. ABG No results for input(s): PHART, HCO3 in the last 72 hours.  Invalid input(s): PCO2, PO2  Studies/Results: No results found.  Anti-infectives: Anti-infectives (From admission, onward)   Start     Dose/Rate Route Frequency Ordered Stop   05/07/20 1000  fluconazole (DIFLUCAN) IVPB 400 mg        400 mg 100 mL/hr over 120 Minutes Intravenous Every 24 hours 05/06/20 2353 05/14/20 0959   05/07/20 0000  piperacillin-tazobactam (ZOSYN) IVPB 3.375 g  Status:  Discontinued        3.375 g 100 mL/hr over 30 Minutes Intravenous Every 8 hours 05/06/20 2353 05/06/20 2355   05/07/20 0000  piperacillin-tazobactam (ZOSYN) IVPB 3.375 g        3.375 g 12.5 mL/hr over 240 Minutes Intravenous Every 8 hours 05/06/20 2356 05/13/20 2359   05/06/20 1600  piperacillin-tazobactam (ZOSYN) IVPB 3.375 g        3.375 g 100 mL/hr over 30 Minutes Intravenous  Once 05/06/20 0947 05/06/20  1708   04/26/20 1000  fluconazole (DIFLUCAN) IVPB 400 mg        400 mg 100 mL/hr over 120 Minutes Intravenous Every 24 hours 04/26/20 0907 05/06/20 2359   04/22/20 0100  piperacillin-tazobactam (ZOSYN) IVPB 3.375 g  Status:  Discontinued        3.375 g 12.5 mL/hr over 240 Minutes Intravenous Every 8 hours 04/22/20 0002 05/06/20 0946   04/22/20 0000  piperacillin-tazobactam (ZOSYN) IVPB 3.375 g  Status:  Discontinued        3.375 g 100 mL/hr over 30 Minutes Intravenous Every 8 hours 04/21/20 2349 04/22/20 0002      Assessment/Plan: HTN Depression Acute PE - heparin gtt  S/P exploratory laparotomy and Graham patch for perforated gastric ulcer by Dr. Arnoldo Morale 8/30 Pelvic abscess s/p IR drainage New RUQ collection around site of prior ulcer repair - concern for possible anastomotic leak although no contrast extrav noted on CT -s/p IR aspiration of fluid 9/14, cultures with E coli - continue Zosyn -daily iodoform packing to midline wound - Has been tolerating diet, drain remains low output and SS. WBC WNL. Hold off on CT today. If something changes may consider tomorrow.  KXF:GHWE diet  VTE: heparin gtt ID: [email or web address]  Follow up: Dr. Arnoldo Morale  LOS: 20 days    Maria Murphy 05/12/2020

## 2020-05-12 NOTE — Progress Notes (Signed)
PROGRESS NOTE    Maria Murphy  OHY:[id number] DOB: 01-03-1956 DOA: 04/21/2020 PCP: Rosita Fire, MD   Brief Narrative:  64 year old female, hospitalizedto [facility] on[facility],with the working diagnosis biliary pancreatitis,chronic cholecystitis/cholelithiasis. Patient underwent further work-up with MRCP, which showed free fluid in the abdomen and pneumoperitoneum. Patient was placed on broad-spectrum antibiotic therapy and vasopressors, she underwent emergent laparotomy. Patient was found to have a perforation in the prepyloric area along the lesser curvature of the stomach and antrum, and a Phillip Heal application was performed. Surgical culture positive for Enterococcus and Candida. Patient was stabilized with antibiotic therapy,butshe has developed worsening leukocytosis.  A CT of the abdomen pelvis on 9/7 showed a new 5.3 x 14 x 3.9 cm abscess in the pelvis,5.1 x 2.6 x 2.0 subcapsular fluid collection along the right inferior liver, also concerning for abscess. CT of the chest with multifocal pulmonary embolus, pulmonary emboli arising in the saddledistribution from each main pulmonary artery with extension into multiple lower lobe point arteries bilaterally. Positive right heart strain,consistent with submassive. She underwent 12 F drain placement into the low abdominopelvic fluid collection. Culture positive for enterococcus and candida. Patient has been placed on heparin drip. Echo with preserved RV function  Follow up CT abdomen and pelvis (09/13), after completing 2 weeks of antibiotic therapy with development of 3.4x1.8x.19 right upper abdominal gas and fluid collection in the area of prior pre-pyloric gastric ulcer perforation. New subhepatic abscess, with cutaneous fistula General surgery, IR reconsulted.  s/p IR aspiration of the fluid 9/14.  cultures growing E. Coli.  She is continued on Zosyn,  General surgery recommended repeat CT scan with p.o. and IV contrast tomorrow to  evaluate if collection is re-accumulated.  Assessment & Plan:   Principal Problem:   Perforated abdominal viscus Active Problems:   Essential hypertension, benign   Hyperbilirubinemia   Epigastric pain   Perforated viscus   Peptic ulcer with perforation (HCC)   Hypotension   Acute saddle pulmonary embolism without acute cor pulmonale (HCC)   Acute submassive pulmonary embolism/saddle embolism with multiple bilateral embolus/acute hypoxic respiratory failure -2D echo with preserved LV and RV function. -Patient was placed on IV heparin drip, subsequently transitioned to Xarelto. -Xarelto held due to new abdominal collection/abscess and patient was changed back to heparin drip  -Continue heparin drip per pharmacy protocol, until interventions completed and safe to resume per surgery. -General surgery recommended repeat CT scan with p.o. and IV contrast tomorrow to evaluate if collection is re-accumulated.   Active Problems: Perforated gastric ulcer status post ex lap and Graham patch by Dr. Arnoldo Morale 8/30 Pelvic abscess status post IR drainage, cultures Enterococcus, Candida -General surgery and IR following, status post IR aspiration of the fluid 9/14, cultures positive for E. coli, Candida tropicalis -Per surgery, if collection reaccumulate, would be more suggestive of leak, remains high risk for peritonitis.   Repeat CT with PO and IV contrast on Monday.  -Continue dressing change, mobilize, underwent exchange of abdominal drain by IR on 9/16 -Continue IV Zosyn, fluconazole, PPI. -- She has been tolerating diet, drain output is low.   Essential hypertension BP remains elevated.  Continue Home BP meds. Hydralazine PRN   History of depression Currently stable.  Hypokalemia - Replaced,  -Follow BMET in a.m.  Constipation:  Colace and Dulcolax    Obesity Estimated body mass index is 38.6 kg/m as calculated from the following:   Height as of this encounter: 5\' 4"   (1.626 m).   Weight as of this encounter: 102 kg.  DVT prophylaxis: Heparin gtt. Code Status: Full Family Communication: No one at bed side. Disposition Plan:  Status is: Inpatient  Remains inpatient appropriate because:IV treatments appropriate due to intensity of illness or inability to take PO   Dispo: The patient is from: Home              Anticipated d/c is to: SNF              Anticipated d/c date is: 3 days              Patient currently is not medically stable to d/c.    Consultants:    General surgery  Infectious disease  Interventional radiology.  Procedures: Laparotomy, IR attempted drainage. Antimicrobials: Anti-infectives (From admission, onward)   Start     Dose/Rate Route Frequency Ordered Stop   05/07/20 1000  fluconazole (DIFLUCAN) IVPB 400 mg        400 mg 100 mL/hr over 120 Minutes Intravenous Every 24 hours 05/06/20 2353 05/14/20 0959   05/07/20 0000  piperacillin-tazobactam (ZOSYN) IVPB 3.375 g  Status:  Discontinued        3.375 g 100 mL/hr over 30 Minutes Intravenous Every 8 hours 05/06/20 2353 05/06/20 2355   05/07/20 0000  piperacillin-tazobactam (ZOSYN) IVPB 3.375 g        3.375 g 12.5 mL/hr over 240 Minutes Intravenous Every 8 hours 05/06/20 2356 05/13/20 2359   05/06/20 1600  piperacillin-tazobactam (ZOSYN) IVPB 3.375 g        3.375 g 100 mL/hr over 30 Minutes Intravenous  Once 05/06/20 0947 05/06/20 1708   04/26/20 1000  fluconazole (DIFLUCAN) IVPB 400 mg        400 mg 100 mL/hr over 120 Minutes Intravenous Every 24 hours 04/26/20 0907 05/06/20 2359   04/22/20 0100  piperacillin-tazobactam (ZOSYN) IVPB 3.375 g  Status:  Discontinued        3.375 g 12.5 mL/hr over 240 Minutes Intravenous Every 8 hours 04/22/20 0002 05/06/20 0946   04/22/20 0000  piperacillin-tazobactam (ZOSYN) IVPB 3.375 g  Status:  Discontinued        3.375 g 100 mL/hr over 30 Minutes Intravenous Every 8 hours 04/21/20 2349 04/22/20 0002       Subjective: Patient was seen and examined at bedside.  Overnight events noted.  She reports her abdominal pain is better, denies, Nausea, vomiting , diarrhea.  She has minimal pinkish output in the drain.  Objective: Vitals:   05/12/20 0309 05/12/20 0400 05/12/20 0800 05/12/20 1323  BP: (!) 141/91 133/78 (!) 148/89 (!) 154/90  Pulse: 78 86 78 88  Resp: 14 (!) 21 18 19   Temp: 98.3 F (36.8 C)   98.3 F (36.8 C)  TempSrc: Oral   Oral  SpO2: 96% 96% 94% 96%  Weight:      Height:        Intake/Output Summary (Last 24 hours) at 05/12/2020 1341 Last data filed at 05/12/2020 0900 Gross per 24 hour  Intake 1289.87 ml  Output 1560 ml  Net -270.13 ml   Filed Weights   04/21/20 1352 04/22/20 0037 04/25/20 0424  Weight: 90.7 kg 90.5 kg 102 kg    Examination:  General exam: Appears calm and comfortable, Alert, oriented x 3 Respiratory system: Clear to auscultation. Respiratory effort normal. Cardiovascular system: S1 & S2 heard, RRR. No JVD, murmurs, rubs, gallops or clicks. No pedal edema. Gastrointestinal system: Abdomen is nondistended, soft and  Non tender in RUQ. No organomegaly or masses felt. Normal bowel sounds  heard. Drain noted on left side. Central nervous system: Alert and oriented. No focal neurological deficits. Extremities: No edema, no clubbing, no cyanosis. Skin: No rashes, lesions or ulcers Psychiatry: Judgement and insight appear normal. Mood & affect appropriate.     Data Reviewed: I have personally reviewed following labs and imaging studies  CBC: Recent Labs  Lab 05/08/20 0243 05/08/20 0243 05/08/20 2056 05/09/20 0745 05/10/20 0214 05/11/20 0159 05/12/20 0707  WBC 7.3   < > 7.4 6.8 7.0 7.1 6.8  NEUTROABS 5.3  --   --   --   --   --   --   HGB 10.5*   < > 10.8* 9.9* 10.1* 10.4* 10.0*  HCT 31.9*   < > 32.4* 29.5* 30.7* 31.7* 30.7*  MCV 87.2   < > 87.6 86.8 86.7 87.6 87.2  PLT 213   < > 231 211 219 200 189   < > = values in this interval not  displayed.   Basic Metabolic Panel: Recent Labs  Lab 05/07/20 0330 05/08/20 0243 05/11/20 0159 05/12/20 0707  NA 134* 136 136 136  K 4.2 3.6 3.6 3.2*  CL 99 100 103 104  CO2 26 24 24 23   GLUCOSE 116* 125* 120* 125*  BUN 7* 6* 6* 6*  CREATININE 0.83 0.79 0.69 0.72  CALCIUM 8.6* 8.5* 8.6* 8.6*  MG  --   --   --  1.7  PHOS  --   --   --  3.6   GFR: Estimated Creatinine Clearance: 83.6 mL/min (by C-G formula based on SCr of 0.72 mg/dL). Liver Function Tests: Recent Labs  Lab 05/07/20 0330 05/11/20 0159 05/12/20 0707  AST 40 60* 41  ALT 22 25 23   ALKPHOS 54 51 43  BILITOT 0.6 0.5 0.5  PROT 6.7 6.5 6.5  ALBUMIN 2.4* 2.5* 2.5*   No results for input(s): LIPASE, AMYLASE in the last 168 hours. No results for input(s): AMMONIA in the last 168 hours. Coagulation Profile: No results for input(s): INR, PROTIME in the last 168 hours. Cardiac Enzymes: No results for input(s): CKTOTAL, CKMB, CKMBINDEX, TROPONINI in the last 168 hours. BNP (last 3 results) No results for input(s): PROBNP in the last 8760 hours. HbA1C: No results for input(s): HGBA1C in the last 72 hours. CBG: Recent Labs  Lab 05/05/20 1621  GLUCAP 133*   Lipid Profile: No results for input(s): CHOL, HDL, LDLCALC, TRIG, CHOLHDL, LDLDIRECT in the last 72 hours. Thyroid Function Tests: No results for input(s): TSH, T4TOTAL, FREET4, T3FREE, THYROIDAB in the last 72 hours. Anemia Panel: No results for input(s): VITAMINB12, FOLATE, FERRITIN, TIBC, IRON, RETICCTPCT in the last 72 hours. Sepsis Labs: No results for input(s): PROCALCITON, LATICACIDVEN in the last 168 hours.  Recent Results (from the past 240 hour(s))  SARS Coronavirus 2 by RT PCR (hospital order, performed in [facility] hospital lab) Nasopharyngeal Nasopharyngeal Swab     Status: None   Collection Time: 05/05/20  6:00 PM   Specimen: Nasopharyngeal Swab  Result Value Ref Range Status   SARS Coronavirus 2 NEGATIVE NEGATIVE Final    Comment:  (NOTE) SARS-CoV-2 target nucleic acids are NOT DETECTED.  The SARS-CoV-2 RNA is generally detectable in upper and lower respiratory specimens during the acute phase of infection. The lowest concentration of SARS-CoV-2 viral copies this assay can detect is 250 copies / mL. A negative result does not preclude SARS-CoV-2 infection and should not be used as the sole basis for treatment or other patient management decisions.  A negative result may occur with improper specimen collection / handling, submission of specimen other than nasopharyngeal swab, presence of viral mutation(s) within the areas targeted by this assay, and inadequate number of viral copies (<250 copies / mL). A negative result must be combined with clinical observations, patient history, and epidemiological information.  Fact Sheet for Patients:   StrictlyIdeas.no  Fact Sheet for Healthcare Providers: BankingDealers.co.za  This test is not yet approved or  cleared by the [place] FDA and has been authorized for detection and/or diagnosis of SARS-CoV-2 by FDA under an Emergency Use Authorization (EUA).  This EUA will remain in effect (meaning this test can be used) for the duration of the COVID-19 declaration under Section 564(b)(1) of the Act, 21 U.S.C. section 360bbb-3(b)(1), unless the authorization is terminated or revoked sooner.  Performed at [facility], [place] [address], [place], [place] [address]   Aerobic/Anaerobic Culture (surgical/deep wound)     Status: None   Collection Time: 05/07/20  5:22 PM   Specimen: Abdomen; Abscess  Result Value Ref Range Status   Specimen Description ABDOMEN ABSCESS  Final   Special Requests   Final    SUB HEPATIC FLUID NEAR PRIOR GASTRIC ULCER PERFORATION   Gram Stain   Final    ABUNDANT WBC PRESENT, PREDOMINANTLY PMN NO ORGANISMS SEEN    Culture   Final    RARE ESCHERICHIA COLI FEW CANDIDA TROPICALIS NO  ANAEROBES ISOLATED Performed at [facility], [place] [address] [place], [place] [address]    Report Status 05/12/2020 FINAL  Final   Organism ID, Bacteria ESCHERICHIA COLI  Final      Susceptibility   Escherichia coli - MIC*    AMPICILLIN >=32 RESISTANT Resistant     CEFAZOLIN 16 SENSITIVE Sensitive     CEFEPIME <=0.12 SENSITIVE Sensitive     CEFTAZIDIME <=1 SENSITIVE Sensitive     CEFTRIAXONE <=0.25 SENSITIVE Sensitive     CIPROFLOXACIN <=0.25 SENSITIVE Sensitive     GENTAMICIN <=1 SENSITIVE Sensitive     IMIPENEM <=0.25 SENSITIVE Sensitive     TRIMETH/SULFA <=20 SENSITIVE Sensitive     AMPICILLIN/SULBACTAM >=32 RESISTANT Resistant     PIP/TAZO <=4 SENSITIVE Sensitive     * RARE ESCHERICHIA COLI  Surgical PCR screen     Status: None   Collection Time: 05/08/20 10:33 PM   Specimen: Nasal Mucosa; Nasal Swab  Result Value Ref Range Status   MRSA, PCR NEGATIVE NEGATIVE Final   Staphylococcus aureus NEGATIVE NEGATIVE Final    Comment: (NOTE) The Xpert SA Assay (FDA approved for NASAL specimens in patients 59 years of age and older), is one component of a comprehensive surveillance program. It is not intended to diagnose infection nor to guide or monitor treatment. Performed at [facility], [place] [address], [place], [place] [address]      Radiology Studies: No results found.  Scheduled Meds: . Chlorhexidine Gluconate Cloth  6 each Topical Daily  . feeding supplement (ENSURE ENLIVE)  237 mL Oral BID BM  . Gerhardt's butt cream   Topical Daily  . multivitamin with minerals  1 tablet Oral Daily  . pantoprazole (PROTONIX) IV  40 mg Intravenous Q12H  . potassium chloride  20 mEq Oral BID  . sodium chloride flush  10 mL Intracatheter Q8H  . sodium chloride flush  5 mL Intracatheter Q8H   Continuous Infusions: . sodium chloride 10 mL/hr at 05/07/20 1600  . dextrose 5% lactated ringers 75 mL/hr at 05/12/20  6681  . fluconazole (DIFLUCAN) IV 400 mg (05/12/20 0953)  .  heparin 1,150 Units/hr (05/12/20 0930)  . piperacillin-tazobactam (ZOSYN)  IV 3.375 g (05/12/20 0903)     LOS: 20 days    Time spent: 25 mins.    Shawna Clamp, MD [facility]   If 7PM-7AM, please contact night-coverage

## 2020-05-13 ENCOUNTER — Inpatient Hospital Stay (HOSPITAL_COMMUNITY): Payer: Medicaid Other

## 2020-05-13 LAB — CBC
HCT: 29.6 % — ABNORMAL LOW (ref 36.0–46.0)
Hemoglobin: 9.6 g/dL — ABNORMAL LOW (ref 12.0–15.0)
MCH: 28.7 pg (ref 26.0–34.0)
MCHC: 32.4 g/dL (ref 30.0–36.0)
MCV: 88.6 fL (ref 80.0–100.0)
Platelets: 195 10*3/uL (ref 150–400)
RBC: 3.34 MIL/uL — ABNORMAL LOW (ref 3.87–5.11)
RDW: 14.1 % (ref 11.5–15.5)
WBC: 6.9 10*3/uL (ref 4.0–10.5)
nRBC: 0 % (ref 0.0–0.2)

## 2020-05-13 LAB — HEPARIN LEVEL (UNFRACTIONATED)
Heparin Unfractionated: 0.16 IU/mL — ABNORMAL LOW (ref 0.30–0.70)
Heparin Unfractionated: 0.49 IU/mL (ref 0.30–0.70)
Heparin Unfractionated: 0.49 IU/mL (ref 0.30–0.70)

## 2020-05-13 LAB — POTASSIUM: Potassium: 3.7 mmol/L (ref 3.5–5.1)

## 2020-05-13 MED ORDER — AMOXICILLIN-POT CLAVULANATE 875-125 MG PO TABS
1.0000 | ORAL_TABLET | Freq: Two times a day (BID) | ORAL | Status: DC
  Administered 2020-05-13 – 2020-05-15 (×4): 1 via ORAL
  Filled 2020-05-13 (×4): qty 1

## 2020-05-13 MED ORDER — IOHEXOL 9 MG/ML PO SOLN
500.0000 mL | ORAL | Status: AC
  Administered 2020-05-13 (×2): 500 mL via ORAL

## 2020-05-13 MED ORDER — IOHEXOL 300 MG/ML  SOLN
100.0000 mL | Freq: Once | INTRAMUSCULAR | Status: AC | PRN
  Administered 2020-05-13: 100 mL via INTRAVENOUS

## 2020-05-13 NOTE — Progress Notes (Signed)
Encouraged patient to get out of bed to chair. Patient declines at this time. Will continue to encourage. Raziel Koenigs, Bettina Gavia RN

## 2020-05-13 NOTE — Progress Notes (Signed)
PROGRESS NOTE    Maria Murphy  ZOX:[id number] DOB: Jun 26, 1956 DOA: 04/21/2020 PCP: Rosita Fire, MD   Brief Narrative:  64 year old female, hospitalizedto [facility] on[facility],with the working diagnosis biliary pancreatitis,chronic cholecystitis/cholelithiasis. Patient underwent further work-up with MRCP, which showed free fluid in the abdomen and pneumoperitoneum. Patient was placed on broad-spectrum antibiotic therapy and vasopressors, she underwent emergent laparotomy. Patient was found to have a perforation in the prepyloric area along the lesser curvature of the stomach and antrum, and a Phillip Heal application was performed. Surgical culture positive for Enterococcus and Candida. Patient was stabilized with antibiotic therapy,butshe has developed worsening leukocytosis.  A CT of the abdomen pelvis on 9/7 showed a new 5.3 x 14 x 3.9 cm abscess in the pelvis,5.1 x 2.6 x 2.0 subcapsular fluid collection along the right inferior liver, also concerning for abscess. CT of the chest with multifocal pulmonary embolus, pulmonary emboli arising in the saddledistribution from each main pulmonary artery with extension into multiple lower lobe point arteries bilaterally. Positive right heart strain,consistent with submassive. She underwent 12 F drain placement into the low abdominopelvic fluid collection. Culture positive for enterococcus and candida. Patient has been placed on heparin drip. Echo with preserved RV function  Follow up CT abdomen and pelvis (09/13), after completing 2 weeks of antibiotic therapy with development of 3.4x1.8x.19 right upper abdominal gas and fluid collection in the area of prior pre-pyloric gastric ulcer perforation. New subhepatic abscess, with cutaneous fistula General surgery, IR reconsulted.  s/p IR aspiration of the fluid 9/14.  cultures growing E. Coli.  She is continued on Zosyn,  General surgery recommended repeat CT scan with p.o. and IV contrast today to evaluate  if collection is re-accumulated.  Assessment & Plan:   Principal Problem:   Perforated abdominal viscus Active Problems:   Essential hypertension, benign   Hyperbilirubinemia   Epigastric pain   Perforated viscus   Peptic ulcer with perforation (HCC)   Hypotension   Acute saddle pulmonary embolism without acute cor pulmonale (HCC)   Acute submassive pulmonary embolism/saddle embolism with multiple bilateral embolus/acute hypoxic respiratory failure -2D echo with preserved LV and RV function. -Patient was placed on IV heparin drip, subsequently transitioned to Xarelto. -Xarelto held due to new abdominal collection/abscess and patient was changed back to heparin drip  -Continue heparin drip per pharmacy protocol, until interventions completed and safe to resume per surgery. -General surgery recommended repeat CT scan with p.o. and IV contrast today to evaluate if collection is re-accumulated.   Active Problems: Perforated gastric ulcer status post ex lap and Graham patch by Dr. Arnoldo Morale 8/30 Pelvic abscess status post IR drainage, cultures Enterococcus, Candida -General surgery and IR following, status post IR aspiration of the fluid 9/14, cultures positive for E. coli, Candida tropicalis -Per surgery, if collection reaccumulate, would be more suggestive of leak, remains high risk for peritonitis.   Repeat CT with PO and IV contrast today. -Continue dressing change, mobilize, underwent exchange of abdominal drain by IR on 9/16 -Continue IV Zosyn, fluconazole, PPI. -- She has been tolerating diet, drain output is low.   Essential hypertension BP remains elevated.  Continue Home BP meds. Hydralazine PRN   History of depression Currently stable.  Hypokalemia - Replaced,  -Follow BMET in a.m.  Constipation:  Colace and Dulcolax    Obesity Estimated body mass index is 38.6 kg/m as calculated from the following:   Height as of this encounter: 5\' 4"  (1.626 m).    Weight as of this encounter: 102 kg.  DVT prophylaxis: Heparin gtt. Code Status: Full Family Communication: discussed with patient in detail. Disposition Plan:  Status is: Inpatient  Remains inpatient appropriate because:IV treatments appropriate due to intensity of illness or inability to take PO   Dispo: The patient is from: Home              Anticipated d/c is to: SNF              Anticipated d/c date is: 3 days              Patient currently is not medically stable to d/c.    Consultants:    General surgery  Infectious disease  Interventional radiology.  Procedures: Laparotomy, IR attempted drainage. Antimicrobials: Anti-infectives (From admission, onward)   Start     Dose/Rate Route Frequency Ordered Stop   05/07/20 1000  fluconazole (DIFLUCAN) IVPB 400 mg        400 mg 100 mL/hr over 120 Minutes Intravenous Every 24 hours 05/06/20 2353 05/13/20 1206   05/07/20 0000  piperacillin-tazobactam (ZOSYN) IVPB 3.375 g  Status:  Discontinued        3.375 g 100 mL/hr over 30 Minutes Intravenous Every 8 hours 05/06/20 2353 05/06/20 2355   05/07/20 0000  piperacillin-tazobactam (ZOSYN) IVPB 3.375 g        3.375 g 12.5 mL/hr over 240 Minutes Intravenous Every 8 hours 05/06/20 2356 05/13/20 2359   05/06/20 1600  piperacillin-tazobactam (ZOSYN) IVPB 3.375 g        3.375 g 100 mL/hr over 30 Minutes Intravenous  Once 05/06/20 0947 05/06/20 1708   04/26/20 1000  fluconazole (DIFLUCAN) IVPB 400 mg        400 mg 100 mL/hr over 120 Minutes Intravenous Every 24 hours 04/26/20 0907 05/06/20 2359   04/22/20 0100  piperacillin-tazobactam (ZOSYN) IVPB 3.375 g  Status:  Discontinued        3.375 g 12.5 mL/hr over 240 Minutes Intravenous Every 8 hours 04/22/20 0002 05/06/20 0946   04/22/20 0000  piperacillin-tazobactam (ZOSYN) IVPB 3.375 g  Status:  Discontinued        3.375 g 100 mL/hr over 30 Minutes Intravenous Every 8 hours 04/21/20 2349 04/22/20 0002       Subjective: Patient was seen and examined at bedside.  Overnight events noted.  Patient reports pain is improved, denies any nausea, vomiting and diarrhea.  She was drinking oral contrast and anticipating CT abdominal pelvis today.   Objective: Vitals:   05/12/20 2013 05/12/20 2321 05/13/20 0740 05/13/20 1358  BP: (!) 145/87 137/79 (!) 148/86 (!) 170/90  Pulse: 94 85 82 73  Resp: (!) 24 (!) 22 20 20   Temp: 99 F (37.2 C) 98.9 F (37.2 C) 98.6 F (37 C) 98.6 F (37 C)  TempSrc: Oral Oral Oral Oral  SpO2: 96% 96% 96% 97%  Weight:      Height:        Intake/Output Summary (Last 24 hours) at 05/13/2020 1456 Last data filed at 05/13/2020 1356 Gross per 24 hour  Intake 3492.19 ml  Output 3465 ml  Net 27.19 ml   Filed Weights   04/21/20 1352 04/22/20 0037 04/25/20 0424  Weight: 90.7 kg 90.5 kg 102 kg    Examination:  General exam: Appears calm and comfortable, Alert, oriented x 3 Respiratory system: Clear to auscultation. Respiratory effort normal. Cardiovascular system: S1 & S2 heard, RRR. No JVD, murmurs, rubs, gallops or clicks. No pedal edema. Gastrointestinal system: Abdomen is nondistended, soft and  Non tender,  No organomegaly or masses felt. Normal bowel sounds heard. Drain noted on left side. Central nervous system: Alert and oriented. No focal neurological deficits. Extremities: No edema, no clubbing, no cyanosis. Skin: No rashes, lesions or ulcers Psychiatry: Judgement and insight appear normal. Mood & affect appropriate.     Data Reviewed: I have personally reviewed following labs and imaging studies  CBC: Recent Labs  Lab 05/08/20 0243 05/08/20 2056 05/09/20 0745 05/10/20 0214 05/11/20 0159 05/12/20 0707 05/13/20 0141  WBC 7.3   < > 6.8 7.0 7.1 6.8 6.9  NEUTROABS 5.3  --   --   --   --   --   --   HGB 10.5*   < > 9.9* 10.1* 10.4* 10.0* 9.6*  HCT 31.9*   < > 29.5* 30.7* 31.7* 30.7* 29.6*  MCV 87.2   < > 86.8 86.7 87.6 87.2 88.6  PLT 213   < >  211 219 200 189 195   < > = values in this interval not displayed.   Basic Metabolic Panel: Recent Labs  Lab 05/07/20 0330 05/08/20 0243 05/11/20 0159 05/12/20 0707 05/13/20 0141  NA 134* 136 136 136  --   K 4.2 3.6 3.6 3.2* 3.7  CL 99 100 103 104  --   CO2 26 24 24 23   --   GLUCOSE 116* 125* 120* 125*  --   BUN 7* 6* 6* 6*  --   CREATININE 0.83 0.79 0.69 0.72  --   CALCIUM 8.6* 8.5* 8.6* 8.6*  --   MG  --   --   --  1.7  --   PHOS  --   --   --  3.6  --    GFR: Estimated Creatinine Clearance: 83.6 mL/min (by C-G formula based on SCr of 0.72 mg/dL). Liver Function Tests: Recent Labs  Lab 05/07/20 0330 05/11/20 0159 05/12/20 0707  AST 40 60* 41  ALT 22 25 23   ALKPHOS 54 51 43  BILITOT 0.6 0.5 0.5  PROT 6.7 6.5 6.5  ALBUMIN 2.4* 2.5* 2.5*   No results for input(s): LIPASE, AMYLASE in the last 168 hours. No results for input(s): AMMONIA in the last 168 hours. Coagulation Profile: No results for input(s): INR, PROTIME in the last 168 hours. Cardiac Enzymes: No results for input(s): CKTOTAL, CKMB, CKMBINDEX, TROPONINI in the last 168 hours. BNP (last 3 results) No results for input(s): PROBNP in the last 8760 hours. HbA1C: No results for input(s): HGBA1C in the last 72 hours. CBG: No results for input(s): GLUCAP in the last 168 hours. Lipid Profile: No results for input(s): CHOL, HDL, LDLCALC, TRIG, CHOLHDL, LDLDIRECT in the last 72 hours. Thyroid Function Tests: No results for input(s): TSH, T4TOTAL, FREET4, T3FREE, THYROIDAB in the last 72 hours. Anemia Panel: No results for input(s): VITAMINB12, FOLATE, FERRITIN, TIBC, IRON, RETICCTPCT in the last 72 hours. Sepsis Labs: No results for input(s): PROCALCITON, LATICACIDVEN in the last 168 hours.  Recent Results (from the past 240 hour(s))  SARS Coronavirus 2 by RT PCR (hospital order, performed in [facility] hospital lab) Nasopharyngeal Nasopharyngeal Swab     Status: None   Collection Time: 05/05/20  6:00 PM    Specimen: Nasopharyngeal Swab  Result Value Ref Range Status   SARS Coronavirus 2 NEGATIVE NEGATIVE Final    Comment: (NOTE) SARS-CoV-2 target nucleic acids are NOT DETECTED.  The SARS-CoV-2 RNA is generally detectable in upper and lower respiratory specimens during the acute phase of infection. The lowest concentration of  SARS-CoV-2 viral copies this assay can detect is 250 copies / mL. A negative result does not preclude SARS-CoV-2 infection and should not be used as the sole basis for treatment or other patient management decisions.  A negative result may occur with improper specimen collection / handling, submission of specimen other than nasopharyngeal swab, presence of viral mutation(s) within the areas targeted by this assay, and inadequate number of viral copies (<250 copies / mL). A negative result must be combined with clinical observations, patient history, and epidemiological information.  Fact Sheet for Patients:   StrictlyIdeas.no  Fact Sheet for Healthcare Providers: BankingDealers.co.za  This test is not yet approved or  cleared by the [place] FDA and has been authorized for detection and/or diagnosis of SARS-CoV-2 by FDA under an Emergency Use Authorization (EUA).  This EUA will remain in effect (meaning this test can be used) for the duration of the COVID-19 declaration under Section 564(b)(1) of the Act, 21 U.S.C. section 360bbb-3(b)(1), unless the authorization is terminated or revoked sooner.  Performed at [facility], [place] [address] [place], [place] [address]   Aerobic/Anaerobic Culture (surgical/deep wound)     Status: None   Collection Time: 05/07/20  5:22 PM   Specimen: Abdomen; Abscess  Result Value Ref Range Status   Specimen Description ABDOMEN ABSCESS  Final   Special Requests   Final    SUB HEPATIC FLUID NEAR PRIOR GASTRIC ULCER PERFORATION   Gram Stain   Final    ABUNDANT WBC  PRESENT, PREDOMINANTLY PMN NO ORGANISMS SEEN    Culture   Final    RARE ESCHERICHIA COLI FEW CANDIDA TROPICALIS NO ANAEROBES ISOLATED Performed at [facility], [place] [address], [place], [place] [address]    Report Status 05/12/2020 FINAL  Final   Organism ID, Bacteria ESCHERICHIA COLI  Final      Susceptibility   Escherichia coli - MIC*    AMPICILLIN >=32 RESISTANT Resistant     CEFAZOLIN 16 SENSITIVE Sensitive     CEFEPIME <=0.12 SENSITIVE Sensitive     CEFTAZIDIME <=1 SENSITIVE Sensitive     CEFTRIAXONE <=0.25 SENSITIVE Sensitive     CIPROFLOXACIN <=0.25 SENSITIVE Sensitive     GENTAMICIN <=1 SENSITIVE Sensitive     IMIPENEM <=0.25 SENSITIVE Sensitive     TRIMETH/SULFA <=20 SENSITIVE Sensitive     AMPICILLIN/SULBACTAM >=32 RESISTANT Resistant     PIP/TAZO <=4 SENSITIVE Sensitive     * RARE ESCHERICHIA COLI  Surgical PCR screen     Status: None   Collection Time: 05/08/20 10:33 PM   Specimen: Nasal Mucosa; Nasal Swab  Result Value Ref Range Status   MRSA, PCR NEGATIVE NEGATIVE Final   Staphylococcus aureus NEGATIVE NEGATIVE Final    Comment: (NOTE) The Xpert SA Assay (FDA approved for NASAL specimens in patients 52 years of age and older), is one component of a comprehensive surveillance program. It is not intended to diagnose infection nor to guide or monitor treatment. Performed at [facility], [place] [address], [place], [place] [address]      Radiology Studies: CT ABDOMEN PELVIS W CONTRAST  Result Date: 05/13/2020 CLINICAL DATA:  Status post repair of perforated gastric ulcer complicated by intra-abdominal abscesses requiring drainage. Evaluate for recurrent right upper quadrant fluid collection. EXAM: CT ABDOMEN AND PELVIS WITH CONTRAST TECHNIQUE: Multidetector CT imaging of the abdomen and pelvis was performed using the standard protocol following bolus administration of intravenous contrast. CONTRAST:  124mL OMNIPAQUE IOHEXOL 300 MG/ML  SOLN  COMPARISON:   CT abdomen pelvis dated May 06, 2020. FINDINGS: Lower chest: Unchanged trace bilateral pleural effusions and bibasilar atelectasis. Hepatobiliary: Small subcapsular fluid collection along the inferior right hepatic lobe has slightly decreased in size, currently measuring 2.3 x 1.7 cm, previously 3.4 x 2.1 cm. Multiple gallstones again noted. No biliary dilatation. Pancreas: Unchanged cystic lesions with associated calcifications within the pancreatic head measuring up to 2.2 cm, better evaluated on recent MRI. No ductal dilatation or surrounding inflammatory changes. Spleen: Normal in size without focal abnormality. Adrenals/Urinary Tract: The adrenal glands are unremarkable. Bilateral parapelvic renal cysts again noted with new high density material within several cysts in the mid and lower pole of the left kidney (series 3, image 41; series 8, image 16). Unchanged 2.2 cm complex cyst in the upper pole of the right kidney is characterized on recent MRI. No renal calculi or hydronephrosis. The bladder is unremarkable. Stomach/Bowel: No oral contrast extravasation. Unchanged small hiatal hernia. The stomach is otherwise within normal limits. No bowel wall thickening, distention, or surrounding inflammatory changes. Sigmoid colonic diverticulosis. Normal appendix. Vascular/Lymphatic: No significant vascular findings are present. No enlarged abdominal or pelvic lymph nodes. Reproductive: Unchanged fibroid uterus.  No adnexal mass. Other: Reaccumulated subhepatic gas and fluid collection anterior to the gallbladder (series 3, image 31), currently measuring 1.7 x 2.3 cm, previously 1.8 x 3.5 cm. Redemonstrated small tract extending from this collection to the distal stomach (series 3, images 32 and 33). Small midline fluid collection along the upper abdominal incision is not significantly changed (series 3, image 37), currently measuring 1.9 x 0.9 cm, previously 2.0 x 0.9 cm. Suprapubic pelvic fluid collection has  nearly resolved status post drainage. The pigtail drainage catheter remains in place. Small volume pneumoperitoneum has resolved. Musculoskeletal: No acute or significant osseous findings. IMPRESSION: 1. Reaccumulated subhepatic gas and fluid collection anterior to the gallbladder, currently measuring 1.7 x 2.3 cm, previously 1.8 x 3.5 cm. Redemonstrated small tract extending from this collection to the distal stomach, concerning for leak. No oral contrast extravasation. 2. Small midline fluid collection along the upper abdominal incision is not significantly changed, currently measuring 1.9 x 0.9 cm, previously 2.0 x 0.9 cm. It is unclear if this communicates with the subhepatic abscess. 3. Suprapubic pelvic fluid collection has nearly resolved status post drainage. Decreasing subcapsular abscess along the inferior right liver. 4. New high density material within several left renal parapelvic cysts, suspicious for spontaneous hemorrhage and potentially related to anticoagulation. No hydronephrosis. Electronically Signed   By: Titus Dubin M.D.   On: 05/13/2020 13:12    Scheduled Meds: . Chlorhexidine Gluconate Cloth  6 each Topical Daily  . feeding supplement (ENSURE ENLIVE)  237 mL Oral BID BM  . Gerhardt's butt cream   Topical Daily  . multivitamin with minerals  1 tablet Oral Daily  . pantoprazole (PROTONIX) IV  40 mg Intravenous Q12H   Continuous Infusions: . sodium chloride 10 mL/hr at 05/07/20 1600  . dextrose 5% lactated ringers 75 mL/hr at 05/13/20 1301  . heparin 1,400 Units/hr (05/13/20 1301)  . piperacillin-tazobactam (ZOSYN)  IV 12.5 mL/hr at 05/13/20 1301     LOS: 21 days    Time spent: 25 mins.    Shawna Clamp, MD [facility]   If 7PM-7AM, please contact night-coverage

## 2020-05-13 NOTE — Progress Notes (Signed)
ANTICOAGULATION CONSULT NOTE - Follow Up Consult  Pharmacy Consult for heparin Indication: pulmonary embolus  Labs: Recent Labs    05/11/20 0159 05/11/20 0159 05/12/20 0707 05/13/20 0141  HGB 10.4*   < > 10.0* 9.6*  HCT 31.7*  --  30.7* 29.6*  PLT 200  --  189 195  HEPARINUNFRC 0.48  --  0.50 0.16*  CREATININE 0.69  --  0.72  --    < > = values in this interval not displayed.    Assessment: 64yo female subtherapeutic on heparin after two levels at goal; no gtt issues or signs of bleeding per RN.  Goal of Therapy:  Heparin level 0.3-0.7 units/ml   Plan:  Will increase heparin gtt by 3 units/kgABW/hr to 1400 units/hr and check level in 6 hours.    Wynona Neat, PharmD, BCPS  05/13/2020,2:54 AM

## 2020-05-13 NOTE — Progress Notes (Signed)
Patient assist with 2 nursing staff with walker to chair. Patient sitting up to eat supper. Will monitor .Nelwyn Hebdon, Bettina Gavia RN

## 2020-05-13 NOTE — Progress Notes (Signed)
Unable to unscrew tubing to pelvic drain in order to flush per orders. IR made aware and stated IR staff member would come up to evaluate drain. Will monitor patient. Abryana Lykens, Bettina Gavia RN

## 2020-05-13 NOTE — Progress Notes (Signed)
ANTICOAGULATION CONSULT NOTE - [place] for Heparin Indication: pulmonary embolus  Allergies  Allergen Reactions  . Wheat Extract Swelling    Patient Measurements: Height: 5\' 4"  (162.6 cm) Weight: 102 kg (224 lb 13.9 oz) IBW/kg (Calculated) : 54.7 Heparin Dosing Weight:  78.4 kg  Vital Signs: Temp: 98.6 F (37 C) (09/20 0740) Temp Source: Oral (09/20 0740) BP: 148/86 (09/20 0740) Pulse Rate: 82 (09/20 0740)  Labs: Recent Labs    05/11/20 0159 05/11/20 0159 05/12/20 0707 05/13/20 0141 05/13/20 0915  HGB 10.4*   < > 10.0* 9.6*  --   HCT 31.7*  --  30.7* 29.6*  --   PLT 200  --  189 195  --   HEPARINUNFRC 0.48   < > 0.50 0.16* 0.49  CREATININE 0.69  --  0.72  --   --    < > = values in this interval not displayed.    Estimated Creatinine Clearance: 83.6 mL/min (by C-G formula based on SCr of 0.72 mg/dL).   Assessment: 64 yo female on heparin for submassive acute pulmonary embolism/ saddle with multiple bilateral embolus/per CTA 9/8; dopplers negative for DVT on 9/8. Xarelto was started but then was stopped for procedures (last dose 9/13). Patient noted w/ new RUQ collection around site of prior ulcer repair - S/P peptic ulcer perforation -9/14: Pelvic abscess s/p IR drainage  Heparin level at goal , hg stable  Goal of Therapy:  Heparin level 0.3-0.7 units/ml aPTT 66-102 seconds Monitor platelets by anticoagulation protocol: Yes   Plan:  Continue IV Heparin at 1150 units/hr Confirm heparin level later today Daily heparin level and CBC  Hildred Laser, PharmD Clinical Pharmacist **Pharmacist phone directory can now be found on [email or web address] ([email or web address]).  Listed under [place].

## 2020-05-13 NOTE — Plan of Care (Signed)
  Problem: Health Behavior/Discharge Planning: Goal: Ability to manage health-related needs will improve Outcome: Progressing   Problem: Clinical Measurements: Goal: Respiratory complications will improve Outcome: Progressing Goal: Cardiovascular complication will be avoided Outcome: Progressing

## 2020-05-13 NOTE — Progress Notes (Addendum)
21 Days Post-Op   Subjective/Chief Complaint: No complaints Tolerating po Minimal pain   Objective: Vital signs in last 24 hours: Temp:  [98.3 F (36.8 C)-99 F (37.2 C)] 98.6 F (37 C) (09/20 0740) Pulse Rate:  [78-94] 82 (09/20 0740) Resp:  [14-24] 20 (09/20 0740) BP: (137-154)/(79-103) 148/86 (09/20 0740) SpO2:  [94 %-97 %] 96 % (09/20 0740) Last BM Date: 05/12/20  Intake/Output from previous day: 09/19 0701 - 09/20 0700 In: 2629.1 [P.O.:240; I.V.:1643.5; IV Piggyback:745.6] Out: 1265 [Urine:1250; Drains:15] Intake/Output this shift: No intake/output data recorded.  Exam: Awake and alert Abdomen minimally tender, drain serosang  Lab Results:  Recent Labs    05/12/20 0707 05/13/20 0141  WBC 6.8 6.9  HGB 10.0* 9.6*  HCT 30.7* 29.6*  PLT 189 195   BMET Recent Labs    05/11/20 0159 05/11/20 0159 05/12/20 0707 05/13/20 0141  NA 136  --  136  --   K 3.6   < > 3.2* 3.7  CL 103  --  104  --   CO2 24  --  23  --   GLUCOSE 120*  --  125*  --   BUN 6*  --  6*  --   CREATININE 0.69  --  0.72  --   CALCIUM 8.6*  --  8.6*  --    < > = values in this interval not displayed.   PT/INR No results for input(s): LABPROT, INR in the last 72 hours. ABG No results for input(s): PHART, HCO3 in the last 72 hours.  Invalid input(s): PCO2, PO2  Studies/Results: No results found.  Anti-infectives: Anti-infectives (From admission, onward)   Start     Dose/Rate Route Frequency Ordered Stop   05/07/20 1000  fluconazole (DIFLUCAN) IVPB 400 mg        400 mg 100 mL/hr over 120 Minutes Intravenous Every 24 hours 05/06/20 2353 05/14/20 0959   05/07/20 0000  piperacillin-tazobactam (ZOSYN) IVPB 3.375 g  Status:  Discontinued        3.375 g 100 mL/hr over 30 Minutes Intravenous Every 8 hours 05/06/20 2353 05/06/20 2355   05/07/20 0000  piperacillin-tazobactam (ZOSYN) IVPB 3.375 g        3.375 g 12.5 mL/hr over 240 Minutes Intravenous Every 8 hours 05/06/20 2356 05/13/20  2359   05/06/20 1600  piperacillin-tazobactam (ZOSYN) IVPB 3.375 g        3.375 g 100 mL/hr over 30 Minutes Intravenous  Once 05/06/20 0947 05/06/20 1708   04/26/20 1000  fluconazole (DIFLUCAN) IVPB 400 mg        400 mg 100 mL/hr over 120 Minutes Intravenous Every 24 hours 04/26/20 0907 05/06/20 2359   04/22/20 0100  piperacillin-tazobactam (ZOSYN) IVPB 3.375 g  Status:  Discontinued        3.375 g 12.5 mL/hr over 240 Minutes Intravenous Every 8 hours 04/22/20 0002 05/06/20 0946   04/22/20 0000  piperacillin-tazobactam (ZOSYN) IVPB 3.375 g  Status:  Discontinued        3.375 g 100 mL/hr over 30 Minutes Intravenous Every 8 hours 04/21/20 2349 04/22/20 0002      Assessment/Plan: HTN Depression Acute PE - heparin gtt  S/P exploratory laparotomy and Graham patch for perforated gastric ulcer by Dr. Arnoldo Morale 8/30 Pelvic abscess s/p IR drainage New RUQ collection around site of prior ulcer repair  Continue antibiotic Check CT scan for follow-up   LOS: 21 days    Coralie Keens MD 05/13/2020

## 2020-05-13 NOTE — Progress Notes (Signed)
ANTICOAGULATION CONSULT NOTE - Follow-Up Consult  Pharmacy Consult for Heparin Indication: pulmonary embolus  Allergies  Allergen Reactions   Wheat Extract Swelling    Patient Measurements: Height: 5\' 4"  (162.6 cm) Weight: 102 kg (224 lb 13.9 oz) IBW/kg (Calculated) : 54.7 Heparin Dosing Weight:  78.4 kg  Vital Signs: Temp: 98.6 F (37 C) (09/20 1358) Temp Source: Oral (09/20 1358) BP: 170/90 (09/20 1358) Pulse Rate: 73 (09/20 1358)  Labs: Recent Labs    05/11/20 0159 05/11/20 0159 05/12/20 0707 05/12/20 0707 05/13/20 0141 05/13/20 0915 05/13/20 1546  HGB 10.4*   < > 10.0*  --  9.6*  --   --   HCT 31.7*  --  30.7*  --  29.6*  --   --   PLT 200  --  189  --  195  --   --   HEPARINUNFRC 0.48   < > 0.50   < > 0.16* 0.49 0.49  CREATININE 0.69  --  0.72  --   --   --   --    < > = values in this interval not displayed.    Estimated Creatinine Clearance: 83.6 mL/min (by C-G formula based on SCr of 0.72 mg/dL).   Assessment: 64 yo female on heparin for submassive acute pulmonary embolism/ saddle with multiple bilateral embolus/per CTA 9/8; dopplers negative for DVT on 9/8. Xarelto was started but then was stopped for procedures (last dose 9/13). Patient noted w/ new RUQ collection around site of prior ulcer repair - S/P peptic ulcer perforation -9/14: Pelvic abscess s/p IR drainage  Confirm heparin level came back therapeutic again tonight. Cont same rate and f/u in AM.   Goal of Therapy:  Heparin level 0.3-0.7 units/ml aPTT 66-102 seconds Monitor platelets by anticoagulation protocol: Yes   Plan:  Continue IV Heparin at 1150 units/hr Daily heparin level and CBC  Onnie Boer, PharmD, BCIDP, AAHIVP, CPP Infectious Disease Pharmacist 05/13/2020 4:56 PM

## 2020-05-13 NOTE — Progress Notes (Signed)
Referring Physician(s): Legrand Rams, [place] Physician: Aletta Edouard  Patient Status:  [facility] - In-pt  Chief Complaint: Follow up pelvic drain placed 05/01/20 in IR with upsize 05/09/20  Subjective:  Patient seen in room today watching TV, she reports pain at her right arm pit which radiates down to her right breast. She thought that this pain was related to her abdominal pain but now that her abdominal pain has resolved and the pain is still there she's wondering if it's something else. Nothing makes the pain better or worse that she has noticed and it not changed with eating or movement.   Per RN unable to unscrew drain to flush today but otherwise no concerns noted.  Allergies: Wheat extract  Medications: Prior to Admission medications   Medication Sig Start Date End Date Taking? Authorizing Provider  acetaminophen (TYLENOL) 500 MG tablet Take 1 tablet (500 mg total) by mouth every 6 (six) hours as needed. 08/10/18  Yes Idol, Almyra Free, PA-C  citalopram (CELEXA) 20 MG tablet Take 1 tablet (20 mg total) by mouth daily. 08/16/19  Yes Jaynee Eagles, PA-C  donepezil (ARICEPT) 10 MG tablet Take 5-10 mg by mouth in the morning and at bedtime.  04/02/20  Yes [provider]  gabapentin (NEURONTIN) 300 MG capsule Take 300 mg by mouth 2 (two) times daily.  08/23/19  Yes [provider]  lisinopril-hydrochlorothiazide (PRINZIDE,ZESTORETIC) 10-12.5 MG tablet Take 1 tablet by mouth daily. 08/07/18  Yes [provider]  traZODone (DESYREL) 100 MG tablet Take 200 mg by mouth at bedtime.  08/02/19  Yes [provider]  Ginger, Zingiber officinalis, (GINGER PO) Take 1 tablet by mouth daily.     [provider]  LORazepam (ATIVAN) 1 MG tablet Take 1 tablet (1 mg total) by mouth every 6 (six) hours as needed for anxiety. 05/06/20   Arrien, Jimmy Picket, MD  methocarbamol (ROBAXIN) 500 MG tablet Take 1 tablet (500 mg total) by mouth every 8 (eight) hours  as needed for muscle spasms. Patient not taking: Reported on 04/22/2020 03/26/20   Ezequiel Essex, MD  naproxen (NAPROSYN) 500 MG tablet Take 1 tablet (500 mg total) by mouth 2 (two) times daily as needed. Patient not taking: Reported on 04/22/2020 03/26/20   Ezequiel Essex, MD  oxyCODONE (OXY IR/ROXICODONE) 5 MG immediate release tablet Take 1 tablet (5 mg total) by mouth every 6 (six) hours as needed for moderate pain or severe pain. 05/06/20   Arrien, Jimmy Picket, MD  pantoprazole (PROTONIX) 40 MG tablet Take 1 tablet (40 mg total) by mouth 2 (two) times daily. 05/06/20 06/05/20  Arrien, Jimmy Picket, MD  Rivaroxaban (XARELTO) 15 MG TABS tablet Take 1 tablet (15 mg total) by mouth 2 (two) times daily with a meal for 17 days. Complete on 05/23/20 05/06/20 05/23/20  Arrien, Jimmy Picket, MD  rivaroxaban (XARELTO) 20 MG TABS tablet Take 1 tablet (20 mg total) by mouth daily with supper. Start 05/24/20 05/24/20   Arrien, Jimmy Picket, MD     Vital Signs: BP (!) 170/90 (BP Location: Left Arm)   Pulse 73   Temp 98.6 F (37 C) (Oral)   Resp 20   Ht 5\' 4"  (1.626 m)   Wt 224 lb 13.9 oz (102 kg)   SpO2 97%   BMI 38.60 kg/m   Physical Exam Vitals and nursing note reviewed.  Constitutional:      General: She is not in acute distress. HENT:     Head: Normocephalic.  Cardiovascular:  Rate and Rhythm: Normal rate.  Pulmonary:     Effort: Pulmonary effort is normal.  Abdominal:     General: There is no distension.     Palpations: Abdomen is soft.     Tenderness: There is no abdominal tenderness.     Comments: (+) left sided drain to gravity with small amount of frank blood in drainage bag. Able to flush/aspirate with 10 cc NS without issues today.  Insertion site unremarkable.  Skin:    General: Skin is warm and dry.  Neurological:     Mental Status: She is alert. Mental status is at baseline.     Imaging: CT ABDOMEN PELVIS W CONTRAST  Result Date: 05/13/2020 CLINICAL  DATA:  Status post repair of perforated gastric ulcer complicated by intra-abdominal abscesses requiring drainage. Evaluate for recurrent right upper quadrant fluid collection. EXAM: CT ABDOMEN AND PELVIS WITH CONTRAST TECHNIQUE: Multidetector CT imaging of the abdomen and pelvis was performed using the standard protocol following bolus administration of intravenous contrast. CONTRAST:  139mL OMNIPAQUE IOHEXOL 300 MG/ML  SOLN COMPARISON:  CT abdomen pelvis dated May 06, 2020. FINDINGS: Lower chest: Unchanged trace bilateral pleural effusions and bibasilar atelectasis. Hepatobiliary: Small subcapsular fluid collection along the inferior right hepatic lobe has slightly decreased in size, currently measuring 2.3 x 1.7 cm, previously 3.4 x 2.1 cm. Multiple gallstones again noted. No biliary dilatation. Pancreas: Unchanged cystic lesions with associated calcifications within the pancreatic head measuring up to 2.2 cm, better evaluated on recent MRI. No ductal dilatation or surrounding inflammatory changes. Spleen: Normal in size without focal abnormality. Adrenals/Urinary Tract: The adrenal glands are unremarkable. Bilateral parapelvic renal cysts again noted with new high density material within several cysts in the mid and lower pole of the left kidney (series 3, image 41; series 8, image 16). Unchanged 2.2 cm complex cyst in the upper pole of the right kidney is characterized on recent MRI. No renal calculi or hydronephrosis. The bladder is unremarkable. Stomach/Bowel: No oral contrast extravasation. Unchanged small hiatal hernia. The stomach is otherwise within normal limits. No bowel wall thickening, distention, or surrounding inflammatory changes. Sigmoid colonic diverticulosis. Normal appendix. Vascular/Lymphatic: No significant vascular findings are present. No enlarged abdominal or pelvic lymph nodes. Reproductive: Unchanged fibroid uterus.  No adnexal mass. Other: Reaccumulated subhepatic gas and fluid  collection anterior to the gallbladder (series 3, image 31), currently measuring 1.7 x 2.3 cm, previously 1.8 x 3.5 cm. Redemonstrated small tract extending from this collection to the distal stomach (series 3, images 32 and 33). Small midline fluid collection along the upper abdominal incision is not significantly changed (series 3, image 37), currently measuring 1.9 x 0.9 cm, previously 2.0 x 0.9 cm. Suprapubic pelvic fluid collection has nearly resolved status post drainage. The pigtail drainage catheter remains in place. Small volume pneumoperitoneum has resolved. Musculoskeletal: No acute or significant osseous findings. IMPRESSION: 1. Reaccumulated subhepatic gas and fluid collection anterior to the gallbladder, currently measuring 1.7 x 2.3 cm, previously 1.8 x 3.5 cm. Redemonstrated small tract extending from this collection to the distal stomach, concerning for leak. No oral contrast extravasation. 2. Small midline fluid collection along the upper abdominal incision is not significantly changed, currently measuring 1.9 x 0.9 cm, previously 2.0 x 0.9 cm. It is unclear if this communicates with the subhepatic abscess. 3. Suprapubic pelvic fluid collection has nearly resolved status post drainage. Decreasing subcapsular abscess along the inferior right liver. 4. New high density material within several left renal parapelvic cysts, suspicious for spontaneous hemorrhage  and potentially related to anticoagulation. No hydronephrosis. Electronically Signed   By: Titus Dubin M.D.   On: 05/13/2020 13:12    Labs:  CBC: Recent Labs    05/10/20 0214 05/11/20 0159 05/12/20 0707 05/13/20 0141  WBC 7.0 7.1 6.8 6.9  HGB 10.1* 10.4* 10.0* 9.6*  HCT 30.7* 31.7* 30.7* 29.6*  PLT 219 200 189 195    COAGS: Recent Labs    05/01/20 0818 05/07/20 1555 05/08/20 1757 05/09/20 0745 05/09/20 1440 05/10/20 0214  INR 1.2  --   --   --   --   --   APTT  --    < > 41* 94* 74* 116*   < > = values in this  interval not displayed.    BMP: Recent Labs    05/07/20 0330 05/07/20 0330 05/08/20 0243 05/11/20 0159 05/12/20 0707 05/13/20 0141  NA 134*  --  136 136 136  --   K 4.2   < > 3.6 3.6 3.2* 3.7  CL 99  --  100 103 104  --   CO2 26  --  24 24 23   --   GLUCOSE 116*  --  125* 120* 125*  --   BUN 7*  --  6* 6* 6*  --   CALCIUM 8.6*  --  8.5* 8.6* 8.6*  --   CREATININE 0.83  --  0.79 0.69 0.72  --   GFRNONAA >60  --  >60 >60 >60  --   GFRAA >60  --  >60 >60 >60  --    < > = values in this interval not displayed.    LIVER FUNCTION TESTS: Recent Labs    05/02/20 0130 05/07/20 0330 05/11/20 0159 05/12/20 0707  BILITOT 0.7 0.6 0.5 0.5  AST 25 40 60* 41  ALT 15 22 25 23   ALKPHOS 64 54 51 43  PROT 5.4* 6.7 6.5 6.5  ALBUMIN 2.0* 2.4* 2.5* 2.5*    Assessment and Plan:  64 y/o F s/p pelvic drain placed 05/01/20 in IR with upsize 05/09/20 seen today for drain follow up. Luer lock connection tubing difficult to remove but was able to be unscrewed successfully by this writer, likely 2/2 dried blood on threads. Drain flushed/aspirated with 10 cc NS with return of frank blood, no pain or leakage with flushing. Insertion site unremarkable.  Per RN scant drainage today and per I/O ~10 cc output QD for last several days. CT scan today shows near resolution of suprapubic fluid collection. Will d/c flushes today and continue to monitor output for additional 1-2 days and if output remains minimal will plan to remove at bedside.  Additionally noted on CT abd/pelvis today was reaccumulation of subhepatic gas and fluid collection which was previously aspirated in IR on 9/15. Imaging reviewed by IR attending (Dr. Kathlene Cote) today who feels fluid collection is too small for percutaneous aspiration/drain placement at this time.   Electronically Signed: Joaquim Nam, PA-C 05/13/2020, 2:16 PM   I spent a total of 15 Minutes at the the patient's bedside AND on the patient's hospital floor or unit,  greater than 50% of which was counseling/coordinating care for pelvic drain follow up.

## 2020-05-14 LAB — BASIC METABOLIC PANEL
Anion gap: 11 (ref 5–15)
BUN: 5 mg/dL — ABNORMAL LOW (ref 8–23)
CO2: 23 mmol/L (ref 22–32)
Calcium: 8.7 mg/dL — ABNORMAL LOW (ref 8.9–10.3)
Chloride: 103 mmol/L (ref 98–111)
Creatinine, Ser: 0.69 mg/dL (ref 0.44–1.00)
GFR calc Af Amer: >60 mL/min (ref >60)
GFR calc non Af Amer: >60 mL/min (ref >60)
Glucose, Bld: 118 mg/dL — ABNORMAL HIGH (ref 70–99)
Potassium: 3.7 mmol/L (ref 3.5–5.1)
Sodium: 137 mmol/L (ref 135–145)

## 2020-05-14 LAB — CBC
HCT: 30.1 % — ABNORMAL LOW (ref 36.0–46.0)
Hemoglobin: 9.9 g/dL — ABNORMAL LOW (ref 12.0–15.0)
MCH: 29.1 pg (ref 26.0–34.0)
MCHC: 32.9 g/dL (ref 30.0–36.0)
MCV: 88.5 fL (ref 80.0–100.0)
Platelets: 220 10*3/uL (ref 150–400)
RBC: 3.4 MIL/uL — ABNORMAL LOW (ref 3.87–5.11)
RDW: 14.1 % (ref 11.5–15.5)
WBC: 8.5 10*3/uL (ref 4.0–10.5)
nRBC: 0 % (ref 0.0–0.2)

## 2020-05-14 LAB — HEPARIN LEVEL (UNFRACTIONATED)
Heparin Unfractionated: 0.84 IU/mL — ABNORMAL HIGH (ref 0.30–0.70)
Heparin Unfractionated: 0.6 IU/mL (ref 0.30–0.70)

## 2020-05-14 LAB — MAGNESIUM: Magnesium: 1.8 mg/dL (ref 1.7–2.4)

## 2020-05-14 MED ORDER — RIVAROXABAN 15 MG PO TABS
15.0000 mg | ORAL_TABLET | Freq: Two times a day (BID) | ORAL | Status: DC
  Administered 2020-05-14 – 2020-05-17 (×7): 15 mg via ORAL
  Filled 2020-05-14 (×7): qty 1

## 2020-05-14 MED ORDER — RIVAROXABAN 20 MG PO TABS: 20.0000 mg | ORAL_TABLET | Freq: Every day | ORAL | Status: DC

## 2020-05-14 MED ORDER — PANTOPRAZOLE SODIUM 40 MG PO TBEC
40.0000 mg | DELAYED_RELEASE_TABLET | Freq: Two times a day (BID) | ORAL | Status: DC
  Administered 2020-05-14 – 2020-05-17 (×7): 40 mg via ORAL
  Filled 2020-05-14 (×7): qty 1

## 2020-05-14 NOTE — Progress Notes (Signed)
ANTICOAGULATION CONSULT NOTE - [place] for Heparin Indication: pulmonary embolus  Allergies  Allergen Reactions  . Wheat Extract Swelling    Patient Measurements: Height: 5\' 4"  (162.6 cm) Weight: 102 kg (224 lb 13.9 oz) IBW/kg (Calculated) : 54.7 Heparin Dosing Weight:  78.4 kg  Vital Signs: Temp: 98.4 F (36.9 C) (09/21 1132) Temp Source: Oral (09/21 1132) BP: 143/92 (09/21 1132) Pulse Rate: 87 (09/21 1132)  Labs: Recent Labs    05/12/20 0707 05/12/20 0707 05/13/20 0141 05/13/20 0915 05/13/20 1546 05/14/20 0105 05/14/20 1114  HGB 10.0*   < > 9.6*  --   --  9.9*  --   HCT 30.7*  --  29.6*  --   --  30.1*  --   PLT 189  --  195  --   --  220  --   HEPARINUNFRC 0.50   < > 0.16*   < > 0.49 0.84* 0.60  CREATININE 0.72  --   --   --   --  0.69  --    < > = values in this interval not displayed.    Estimated Creatinine Clearance: 83.6 mL/min (by C-G formula based on SCr of 0.69 mg/dL).   Assessment: 64 yo female on heparin for submassive acute pulmonary embolism/ saddle with multiple bilateral embolus/per CTA 9/8; dopplers negative for DVT on 9/8. Xarelto was started but then was stopped for procedures (last dose 9/13). Patient noted w/ new RUQ collection around site of prior ulcer repair - S/P peptic ulcer perforation -9/14: Pelvic abscess s/p IR drainage  Heparin level this morning is therapeutic at 0.6 on lower rate.  Goal of Therapy:  Heparin level 0.3-0.7 units/ml aPTT 66-102 seconds Monitor platelets by anticoagulation protocol: Yes   Plan:  Continue IV Heparin at 1300 units/hr Daily heparin level and CBC  Arrie Senate, PharmD, BCPS Clinical Pharmacist [phone] Please check AMION for all [place] numbers 05/14/2020

## 2020-05-14 NOTE — Progress Notes (Signed)
Nutrition Follow Up  DOCUMENTATION CODES:   Not applicable  INTERVENTION:   Obtain recent weight (last one documented on 9/2)   Ensure Enlive po BID, each supplement provides 350 kcal and 20 grams of protein  MVI daily   NUTRITION DIAGNOSIS:   Increased nutrient needs related to post-op healing as evidenced by estimated needs.  Ongoing  GOAL:   Patient will meet greater than or equal to 90% of their needs   Progressing   MONITOR:   PO intake, Supplement acceptance, Weight trends, Labs, I & O's  REASON FOR ASSESSMENT:   Consult Assessment of nutrition requirement/status  ASSESSMENT:   Patient with PMH significant for HTN and memory loss. Presented to [facility] with acute onset pneumoperitoneum likely from peptic ulcer perforation. Transferred to [facility] for submassive PE and peritonitis.   8/28- admit [facility] 8/30- MRCP shows new free fluid in abdomen and pneumoperitoneum, ex lap, gastrorrhaphy  9/8- tx [facility], pelvic abscess drain placement 9/16- exchange abdominal drain  New fluid collection around site of prior ulcer repair. Too small to aspirate or drain. Appetite progressing per pt. Last five meal completions charted as 40-75%. Drinking Ensure BID and would like to continue.   Admission weight: 90.5 kg (taken 9/2) Current weight: 102 kg   Drips: D5 @ 75 ml/hr  Medications: MVI with minerals Labs: CBG 118-125  Diet Order:   Diet Order            DIET SOFT Room service appropriate? Yes; Fluid consistency: Thin  Diet effective now           Diet - low sodium heart healthy                 EDUCATION NEEDS:   Not appropriate for education at this time  Skin:  Skin Assessment: Skin Integrity Issues: Skin Integrity Issues:: Incisions Incisions: abdomen  Last BM:  9/19  Height:   Ht Readings from Last 1 Encounters:  04/21/20 5\' 4"  (1.626 m)    Weight:   Wt Readings from Last 1 Encounters:  04/25/20 102 kg    BMI:  Body mass index is 38.6  kg/m.  Estimated Nutritional Needs:   Kcal:  1700-1900 kcal  Protein:  85-100 grams  Fluid:  >/= 1.7 L/day   Mariana Single RD, LDN Clinical Nutrition Pager listed in [place]

## 2020-05-14 NOTE — Progress Notes (Signed)
22 Days Post-Op   Subjective/Chief Complaint: No complaints this morning   Objective: Vital signs in last 24 hours: Temp:  [98.5 F (36.9 C)-98.9 F (37.2 C)] 98.9 F (37.2 C) (09/21 0751) Pulse Rate:  [73-84] 84 (09/21 0751) Resp:  [17-20] 17 (09/21 0751) BP: (151-170)/(84-91) 157/86 (09/21 0751) SpO2:  [95 %-99 %] 96 % (09/21 0751) Last BM Date: 05/12/20  Intake/Output from previous day: 09/20 0701 - 09/21 0700 In: 2546 [I.V.:2302.3; IV Piggyback:243.6] Out: 3800 [Urine:3800] Intake/Output this shift: No intake/output data recorded.  Exam: Awake and alert Abdomen soft, wound packing in place to small wound Drain serosang  Lab Results:  Recent Labs    05/13/20 0141 05/14/20 0105  WBC 6.9 8.5  HGB 9.6* 9.9*  HCT 29.6* 30.1*  PLT 195 220   BMET Recent Labs    05/12/20 0707 05/12/20 0707 05/13/20 0141 05/14/20 0105  NA 136  --   --  137  K 3.2*   < > 3.7 3.7  CL 104  --   --  103  CO2 23  --   --  23  GLUCOSE 125*  --   --  118*  BUN 6*  --   --  5*  CREATININE 0.72  --   --  0.69  CALCIUM 8.6*  --   --  8.7*   < > = values in this interval not displayed.   PT/INR No results for input(s): LABPROT, INR in the last 72 hours. ABG No results for input(s): PHART, HCO3 in the last 72 hours.  Invalid input(s): PCO2, PO2  Studies/Results: CT ABDOMEN PELVIS W CONTRAST  Result Date: 05/13/2020 CLINICAL DATA:  Status post repair of perforated gastric ulcer complicated by intra-abdominal abscesses requiring drainage. Evaluate for recurrent right upper quadrant fluid collection. EXAM: CT ABDOMEN AND PELVIS WITH CONTRAST TECHNIQUE: Multidetector CT imaging of the abdomen and pelvis was performed using the standard protocol following bolus administration of intravenous contrast. CONTRAST:  133mL OMNIPAQUE IOHEXOL 300 MG/ML  SOLN COMPARISON:  CT abdomen pelvis dated May 06, 2020. FINDINGS: Lower chest: Unchanged trace bilateral pleural effusions and bibasilar  atelectasis. Hepatobiliary: Small subcapsular fluid collection along the inferior right hepatic lobe has slightly decreased in size, currently measuring 2.3 x 1.7 cm, previously 3.4 x 2.1 cm. Multiple gallstones again noted. No biliary dilatation. Pancreas: Unchanged cystic lesions with associated calcifications within the pancreatic head measuring up to 2.2 cm, better evaluated on recent MRI. No ductal dilatation or surrounding inflammatory changes. Spleen: Normal in size without focal abnormality. Adrenals/Urinary Tract: The adrenal glands are unremarkable. Bilateral parapelvic renal cysts again noted with new high density material within several cysts in the mid and lower pole of the left kidney (series 3, image 41; series 8, image 16). Unchanged 2.2 cm complex cyst in the upper pole of the right kidney is characterized on recent MRI. No renal calculi or hydronephrosis. The bladder is unremarkable. Stomach/Bowel: No oral contrast extravasation. Unchanged small hiatal hernia. The stomach is otherwise within normal limits. No bowel wall thickening, distention, or surrounding inflammatory changes. Sigmoid colonic diverticulosis. Normal appendix. Vascular/Lymphatic: No significant vascular findings are present. No enlarged abdominal or pelvic lymph nodes. Reproductive: Unchanged fibroid uterus.  No adnexal mass. Other: Reaccumulated subhepatic gas and fluid collection anterior to the gallbladder (series 3, image 31), currently measuring 1.7 x 2.3 cm, previously 1.8 x 3.5 cm. Redemonstrated small tract extending from this collection to the distal stomach (series 3, images 32 and 33). Small midline fluid collection  along the upper abdominal incision is not significantly changed (series 3, image 37), currently measuring 1.9 x 0.9 cm, previously 2.0 x 0.9 cm. Suprapubic pelvic fluid collection has nearly resolved status post drainage. The pigtail drainage catheter remains in place. Small volume pneumoperitoneum has  resolved. Musculoskeletal: No acute or significant osseous findings. IMPRESSION: 1. Reaccumulated subhepatic gas and fluid collection anterior to the gallbladder, currently measuring 1.7 x 2.3 cm, previously 1.8 x 3.5 cm. Redemonstrated small tract extending from this collection to the distal stomach, concerning for leak. No oral contrast extravasation. 2. Small midline fluid collection along the upper abdominal incision is not significantly changed, currently measuring 1.9 x 0.9 cm, previously 2.0 x 0.9 cm. It is unclear if this communicates with the subhepatic abscess. 3. Suprapubic pelvic fluid collection has nearly resolved status post drainage. Decreasing subcapsular abscess along the inferior right liver. 4. New high density material within several left renal parapelvic cysts, suspicious for spontaneous hemorrhage and potentially related to anticoagulation. No hydronephrosis. Electronically Signed   By: Titus Dubin M.D.   On: 05/13/2020 13:12    Anti-infectives: Anti-infectives (From admission, onward)   Start     Dose/Rate Route Frequency Ordered Stop   05/13/20 1700  amoxicillin-clavulanate (AUGMENTIN) 875-125 MG per tablet 1 tablet        1 tablet Oral 2 times daily 05/13/20 1541     05/07/20 1000  fluconazole (DIFLUCAN) IVPB 400 mg        400 mg 100 mL/hr over 120 Minutes Intravenous Every 24 hours 05/06/20 2353 05/13/20 1206   05/07/20 0000  piperacillin-tazobactam (ZOSYN) IVPB 3.375 g  Status:  Discontinued        3.375 g 100 mL/hr over 30 Minutes Intravenous Every 8 hours 05/06/20 2353 05/06/20 2355   05/07/20 0000  piperacillin-tazobactam (ZOSYN) IVPB 3.375 g  Status:  Discontinued        3.375 g 12.5 mL/hr over 240 Minutes Intravenous Every 8 hours 05/06/20 2356 05/13/20 1541   05/06/20 1600  piperacillin-tazobactam (ZOSYN) IVPB 3.375 g        3.375 g 100 mL/hr over 30 Minutes Intravenous  Once 05/06/20 0947 05/06/20 1708   04/26/20 1000  fluconazole (DIFLUCAN) IVPB 400 mg         400 mg 100 mL/hr over 120 Minutes Intravenous Every 24 hours 04/26/20 0907 05/06/20 2359   04/22/20 0100  piperacillin-tazobactam (ZOSYN) IVPB 3.375 g  Status:  Discontinued        3.375 g 12.5 mL/hr over 240 Minutes Intravenous Every 8 hours 04/22/20 0002 05/06/20 0946   04/22/20 0000  piperacillin-tazobactam (ZOSYN) IVPB 3.375 g  Status:  Discontinued        3.375 g 100 mL/hr over 30 Minutes Intravenous Every 8 hours 04/21/20 2349 04/22/20 0002      Assessment/Plan: HTN Depression Acute PE - heparin gtt  S/P exploratory laparotomy and Graham patch for perforated gastric ulcer by Dr. Arnoldo Morale 8/30 Pelvic abscess s/p IR drainage New RUQ collection around site of prior ulcer repair  CT scan shows minimal recurrent fluid in the RUQ too small to place drain or aspirate Clinically, she is doing well and has no clinical evidence of leak at the ulcer Changed to oral antibiotics yesterday OK from a surgical standpoint for oral anticoag meds   LOS: 22 days    Maria Murphy 05/14/2020

## 2020-05-14 NOTE — Progress Notes (Signed)
Referring Physician(s): Rosita Fire [facility])  Supervising Physician: Suzette Battiest  Patient Status:  [facility] - In-pt  Chief Complaint: None  Subjective:  History of ruptured pre-pyloric gastric ulcer s/p ex lap with gastrorrhaphy Phillip Heal patch) complicated by development of pelvic fluid collection s/p pelvic drain placement in IR 05/01/2020; most recently exchanged and upsized in IR 05/09/2020. Patient awake and alert sitting in bed watching TV with no complaints at this time. Female friend/family member at bedside. Pelvic drain site c/d/i.   Allergies: Wheat extract  Medications: Prior to Admission medications   Medication Sig Start Date End Date Taking? Authorizing Provider  acetaminophen (TYLENOL) 500 MG tablet Take 1 tablet (500 mg total) by mouth every 6 (six) hours as needed. 08/10/18  Yes Idol, Almyra Free, PA-C  citalopram (CELEXA) 20 MG tablet Take 1 tablet (20 mg total) by mouth daily. 08/16/19  Yes Jaynee Eagles, PA-C  donepezil (ARICEPT) 10 MG tablet Take 5-10 mg by mouth in the morning and at bedtime.  04/02/20  Yes [provider]  gabapentin (NEURONTIN) 300 MG capsule Take 300 mg by mouth 2 (two) times daily.  08/23/19  Yes [provider]  lisinopril-hydrochlorothiazide (PRINZIDE,ZESTORETIC) 10-12.5 MG tablet Take 1 tablet by mouth daily. 08/07/18  Yes [provider]  traZODone (DESYREL) 100 MG tablet Take 200 mg by mouth at bedtime.  08/02/19  Yes [provider]  Ginger, Zingiber officinalis, (GINGER PO) Take 1 tablet by mouth daily.     [provider]  LORazepam (ATIVAN) 1 MG tablet Take 1 tablet (1 mg total) by mouth every 6 (six) hours as needed for anxiety. 05/06/20   Arrien, Jimmy Picket, MD  methocarbamol (ROBAXIN) 500 MG tablet Take 1 tablet (500 mg total) by mouth every 8 (eight) hours as needed for muscle spasms. Patient not taking: Reported on 04/22/2020 03/26/20   Ezequiel Essex, MD  naproxen (NAPROSYN) 500 MG tablet  Take 1 tablet (500 mg total) by mouth 2 (two) times daily as needed. Patient not taking: Reported on 04/22/2020 03/26/20   Ezequiel Essex, MD  oxyCODONE (OXY IR/ROXICODONE) 5 MG immediate release tablet Take 1 tablet (5 mg total) by mouth every 6 (six) hours as needed for moderate pain or severe pain. 05/06/20   Arrien, Jimmy Picket, MD  pantoprazole (PROTONIX) 40 MG tablet Take 1 tablet (40 mg total) by mouth 2 (two) times daily. 05/06/20 06/05/20  Arrien, Jimmy Picket, MD  Rivaroxaban (XARELTO) 15 MG TABS tablet Take 1 tablet (15 mg total) by mouth 2 (two) times daily with a meal for 17 days. Complete on 05/23/20 05/06/20 05/23/20  Arrien, Jimmy Picket, MD  rivaroxaban (XARELTO) 20 MG TABS tablet Take 1 tablet (20 mg total) by mouth daily with supper. Start 05/24/20 05/24/20   Arrien, Jimmy Picket, MD     Vital Signs: BP (!) 157/86 (BP Location: Left Arm)   Pulse 84   Temp 98.9 F (37.2 C) (Oral)   Resp 17   Ht 5\' 4"  (1.626 m)   Wt 224 lb 13.9 oz (102 kg)   SpO2 96%   BMI 38.60 kg/m   Physical Exam Vitals and nursing note reviewed.  Constitutional:      General: She is not in acute distress. Pulmonary:     Effort: Pulmonary effort is normal. No respiratory distress.  Abdominal:     Comments: Pelvic drain site without tenderness, erythema, drainage, or active bleeding; minimal output of bloody fluid in gravity bag.  Skin:    General: Skin  is warm and dry.  Neurological:     Mental Status: She is alert and oriented to person, place, and time.     Imaging: CT ABDOMEN PELVIS W CONTRAST  Result Date: 05/13/2020 CLINICAL DATA:  Status post repair of perforated gastric ulcer complicated by intra-abdominal abscesses requiring drainage. Evaluate for recurrent right upper quadrant fluid collection. EXAM: CT ABDOMEN AND PELVIS WITH CONTRAST TECHNIQUE: Multidetector CT imaging of the abdomen and pelvis was performed using the standard protocol following bolus administration of  intravenous contrast. CONTRAST:  165mL OMNIPAQUE IOHEXOL 300 MG/ML  SOLN COMPARISON:  CT abdomen pelvis dated May 06, 2020. FINDINGS: Lower chest: Unchanged trace bilateral pleural effusions and bibasilar atelectasis. Hepatobiliary: Small subcapsular fluid collection along the inferior right hepatic lobe has slightly decreased in size, currently measuring 2.3 x 1.7 cm, previously 3.4 x 2.1 cm. Multiple gallstones again noted. No biliary dilatation. Pancreas: Unchanged cystic lesions with associated calcifications within the pancreatic head measuring up to 2.2 cm, better evaluated on recent MRI. No ductal dilatation or surrounding inflammatory changes. Spleen: Normal in size without focal abnormality. Adrenals/Urinary Tract: The adrenal glands are unremarkable. Bilateral parapelvic renal cysts again noted with new high density material within several cysts in the mid and lower pole of the left kidney (series 3, image 41; series 8, image 16). Unchanged 2.2 cm complex cyst in the upper pole of the right kidney is characterized on recent MRI. No renal calculi or hydronephrosis. The bladder is unremarkable. Stomach/Bowel: No oral contrast extravasation. Unchanged small hiatal hernia. The stomach is otherwise within normal limits. No bowel wall thickening, distention, or surrounding inflammatory changes. Sigmoid colonic diverticulosis. Normal appendix. Vascular/Lymphatic: No significant vascular findings are present. No enlarged abdominal or pelvic lymph nodes. Reproductive: Unchanged fibroid uterus.  No adnexal mass. Other: Reaccumulated subhepatic gas and fluid collection anterior to the gallbladder (series 3, image 31), currently measuring 1.7 x 2.3 cm, previously 1.8 x 3.5 cm. Redemonstrated small tract extending from this collection to the distal stomach (series 3, images 32 and 33). Small midline fluid collection along the upper abdominal incision is not significantly changed (series 3, image 37), currently  measuring 1.9 x 0.9 cm, previously 2.0 x 0.9 cm. Suprapubic pelvic fluid collection has nearly resolved status post drainage. The pigtail drainage catheter remains in place. Small volume pneumoperitoneum has resolved. Musculoskeletal: No acute or significant osseous findings. IMPRESSION: 1. Reaccumulated subhepatic gas and fluid collection anterior to the gallbladder, currently measuring 1.7 x 2.3 cm, previously 1.8 x 3.5 cm. Redemonstrated small tract extending from this collection to the distal stomach, concerning for leak. No oral contrast extravasation. 2. Small midline fluid collection along the upper abdominal incision is not significantly changed, currently measuring 1.9 x 0.9 cm, previously 2.0 x 0.9 cm. It is unclear if this communicates with the subhepatic abscess. 3. Suprapubic pelvic fluid collection has nearly resolved status post drainage. Decreasing subcapsular abscess along the inferior right liver. 4. New high density material within several left renal parapelvic cysts, suspicious for spontaneous hemorrhage and potentially related to anticoagulation. No hydronephrosis. Electronically Signed   By: Titus Dubin M.D.   On: 05/13/2020 13:12    Labs:  CBC: Recent Labs    05/11/20 0159 05/12/20 0707 05/13/20 0141 05/14/20 0105  WBC 7.1 6.8 6.9 8.5  HGB 10.4* 10.0* 9.6* 9.9*  HCT 31.7* 30.7* 29.6* 30.1*  PLT 200 189 195 220    COAGS: Recent Labs    05/01/20 0818 05/07/20 1555 05/08/20 1757 05/09/20 0745 05/09/20 1440 05/10/20  0214  INR 1.2  --   --   --   --   --   APTT  --    < > 41* 94* 74* 116*   < > = values in this interval not displayed.    BMP: Recent Labs    05/08/20 0243 05/08/20 0243 05/11/20 0159 05/12/20 0707 05/13/20 0141 05/14/20 0105  NA 136  --  136 136  --  137  K 3.6   < > 3.6 3.2* 3.7 3.7  CL 100  --  103 104  --  103  CO2 24  --  24 23  --  23  GLUCOSE 125*  --  120* 125*  --  118*  BUN 6*  --  6* 6*  --  5*  CALCIUM 8.5*  --  8.6* 8.6*   --  8.7*  CREATININE 0.79  --  0.69 0.72  --  0.69  GFRNONAA >60  --  >60 >60  --  >60  GFRAA >60  --  >60 >60  --  >60   < > = values in this interval not displayed.    LIVER FUNCTION TESTS: Recent Labs    05/02/20 0130 05/07/20 0330 05/11/20 0159 05/12/20 0707  BILITOT 0.7 0.6 0.5 0.5  AST 25 40 60* 41  ALT 15 22 25 23   ALKPHOS 64 54 51 43  PROT 5.4* 6.7 6.5 6.5  ALBUMIN 2.0* 2.4* 2.5* 2.5*    Assessment and Plan:  History of ruptured pre-pyloric gastric ulcer s/p ex lap with gastrorrhaphy Phillip Heal patch) complicated by development of pelvic fluid collection s/p pelvic drain placement in IR 05/01/2020; most recently exchanged and upsized in IR 05/09/2020. Pelvic drain stable with minimal output of bloody fluid in gravity bag (no additional output from drain in past 24 hours per chart). Continue current drain management- continue with Qshift monitor of output. Do NOT flush this drain at this time- if output minimal x 24 hours plan for drain removal at bedside. Further plans per TRH/[facility]S- appreciate and agree with management. IR to follow.   Electronically Signed: Earley Abide, PA-C 05/14/2020, 10:43 AM   I spent a total of 25 Minutes at the the patient's bedside AND on the patient's hospital floor or unit, greater than 50% of which was counseling/coordinating care for pelvic fluid collection s/p pelvic drain placement.

## 2020-05-14 NOTE — Progress Notes (Signed)
Physical Therapy Treatment Patient Details Name: Maria Murphy MRN: [id number] DOB: Jan 10, 1956 Today's Date: 05/14/2020    History of Present Illness Pt adm to [facility] on 8/29 with biliary pancreatitis, chronic cholecystitis. Pt found to have gastric perforation and underwent emergent laparotomy for repair. On 9/7 pt found to have pelvic abscesses and drain placed. 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 Transferred to [facility] on 9/8.  PMH - HTN, foot ulcer, memory loss, obesity.    PT Comments    Pt making slow steady progress. Continue to recommend ST-SNF.    Follow Up Recommendations  SNF     Equipment Recommendations  Rolling walker with 5" wheels    Recommendations for Other Services       Precautions / Restrictions Precautions Precautions: Fall    Mobility  Bed Mobility Overal bed mobility: Needs Assistance Bed Mobility: Rolling;Sidelying to Sit Rolling: Mod assist Sidelying to sit: Mod assist       General bed mobility comments: Assist to elevate trunk into sitting  Transfers Overall transfer level: Needs assistance Equipment used: Rolling walker (2 wheeled) Transfers: Sit to/from Stand Sit to Stand: Mod assist         General transfer comment: Assist to bring hips up and for balance. Pt pulling up on walker despite verbal cues. Pt has limited rt knee flexion which means she cannot bring rt foot back in under her in preparation for standing  Ambulation/Gait Ambulation/Gait assistance: Min assist;+2 safety/equipment Gait Distance (Feet): 18 Feet Assistive device: Rolling walker (2 wheeled) Gait Pattern/deviations: Step-through pattern;Decreased step length - right;Decreased step length - left;Shuffle Gait velocity: decreased Gait velocity interpretation: <1.31 ft/sec, indicative of household ambulator General Gait Details: Assist for balance and support.    Stairs             Wheelchair Mobility    Modified Rankin (Stroke Patients  Only)       Balance   Sitting-balance support: Feet supported;No upper extremity supported Sitting balance-Leahy Scale: Fair     Standing balance support: During functional activity;Bilateral upper extremity supported Standing balance-Leahy Scale: Poor Standing balance comment: walker and min assist for static standing                            Cognition Arousal/Alertness: Awake/alert Behavior During Therapy: WFL for tasks assessed/performed Overall Cognitive Status: Within Functional Limits for tasks assessed                                        Exercises      General Comments General comments (skin integrity, edema, etc.): VSS on RA. Dyspnea 3/4      Pertinent Vitals/Pain Pain Assessment: Faces Faces Pain Scale: Hurts little more Pain Location: abdomen with mobility Pain Descriptors / Indicators: Grimacing;Guarding Pain Intervention(s): Monitored during session;Repositioned    Home Living                      Prior Function            PT Goals (current goals can now be found in the care plan section) Progress towards PT goals: Progressing toward goals    Frequency    Min 2X/week      PT Plan Current plan remains appropriate    Co-evaluation  AM-PAC PT "6 Clicks" Mobility   Outcome Measure  Help needed turning from your back to your side while in a flat bed without using bedrails?: A Lot Help needed moving from lying on your back to sitting on the side of a flat bed without using bedrails?: A Lot Help needed moving to and from a bed to a chair (including a wheelchair)?: A Lot Help needed standing up from a chair using your arms (e.g., wheelchair or bedside chair)?: A Lot Help needed to walk in hospital room?: A Lot Help needed climbing 3-5 steps with a railing? : Total 6 Click Score: 11    End of Session Equipment Utilized During Treatment: Gait belt Activity Tolerance: Patient limited by  fatigue Patient left: in chair;with call bell/phone within reach;with chair alarm set Nurse Communication: Other (comment) (purewick out) PT Visit Diagnosis: Unsteadiness on feet (R26.81);Other abnormalities of gait and mobility (R26.89);Muscle weakness (generalized) (M62.81)     Time: 0272-5366 PT Time Calculation (min) (ACUTE ONLY): 15 min  Charges:  $Gait Training: 8-22 mins                     [place] Pager [phone] Office Center 05/14/2020, 3:27 PM

## 2020-05-14 NOTE — Progress Notes (Signed)
PROGRESS NOTE    Maria Murphy  VOJ:[id number] DOB: [birth year] DOA: 04/21/2020 PCP: Rosita Fire, MD   Brief Narrative:  64 year old female, hospitalizedto [facility] on8/[facility],with the working diagnosis biliary pancreatitis,chronic cholecystitis/cholelithiasis. Patient underwent further work-up with MRCP, which showed free fluid in the abdomen and pneumoperitoneum. Patient was placed on broad-spectrum antibiotic therapy and vasopressors, she underwent emergent laparotomy. Patient was found to have a perforation in the prepyloric area along the lesser curvature of the stomach and antrum, and a Phillip Heal application was performed. Surgical culture positive for Enterococcus and Candida. Patient was stabilized with antibiotic therapy,butshe has developed worsening leukocytosis.  A CT of the abdomen pelvis on 9/7 showed a new 5.3 x 14 x 3.9 cm abscess in the pelvis,5.1 x 2.6 x 2.0 subcapsular fluid collection along the right inferior liver, also concerning for abscess. CT of the chest with multifocal pulmonary embolus, pulmonary emboli arising in the saddledistribution from each main pulmonary artery with extension into multiple lower lobe point arteries bilaterally. Positive right heart strain,consistent with submassive. She underwent 12 F drain placement into the low abdominopelvic fluid collection. Culture positive for enterococcus and candida. Patient has been placed on heparin drip. Echo with preserved RV function  Follow up CT abdomen and pelvis (09/13), after completing 2 weeks of antibiotic therapy with development of 3.4x1.8x.19 right upper abdominal gas and fluid collection in the area of prior pre-pyloric gastric ulcer perforation. New subhepatic abscess, with cutaneous fistula.  General surgery, IR reconsulted.  s/p IR aspiration of the fluid 9/14.  cultures growing E. Coli.  She is continued on Zosyn, transitioned to augmentin yesterday, repeat CT scan shows minimal recollection in the  RUQ, too small to place a drain or aspirate, there is no clinical evidence of leakage at the ulcer.  Anticoagulation can be transitioned to oral anticoagulant.  Assessment & Plan:   Principal Problem:   Perforated abdominal viscus Active Problems:   Essential hypertension, benign   Hyperbilirubinemia   Epigastric pain   Perforated viscus   Peptic ulcer with perforation (HCC)   Hypotension   Acute saddle pulmonary embolism without acute cor pulmonale (HCC)   Acute submassive pulmonary embolism/saddle embolism with multiple bilateral embolus/acute hypoxic respiratory failure. -2D echo with preserved LV and RV function. -Patient was placed on IV heparin drip, subsequently transitioned to Xarelto. -Xarelto held due to new abdominal collection/abscess and patient was changed back to heparin drip  -Continue heparin drip per pharmacy protocol, until interventions completed and safe to resume per surgery. -General surgery recommended repeat CT scan with p.o. and IV contrast today to evaluate if collection is re-accumulated. - will resume Xarelto and dc heparin. Cleared from surgery.   Active Problems: Perforated gastric ulcer status post ex lap and Graham patch by Dr. Arnoldo Morale 8/30 Pelvic abscess status post IR drainage, cultures Enterococcus, Candida -General surgery and IR following, status post IR aspiration of the fluid 9/14, cultures positive for E. coli, Candida tropicalis -Per surgery, if collection reaccumulate, would be more suggestive of leak, remains high risk for peritonitis.  - repeat CT scan shows minimal recollection in the RUQ, too small to place a drain or aspirate, there is no clinical evidence of leakage at the ulcer. -Continue dressing change, mobilize, underwent exchange of abdominal drain by IR on 9/16 -Continued on  IV Zosyn, fluconazole, PPI. -- She has been tolerating diet, drain output is low.  - Antibiotic changed to Augmentin yesterday.  Essential  hypertension BP remains elevated.  Continue Home BP meds. Hydralazine PRN  History of depression Currently stable.  Hypokalemia - Improved . Replaced,  -Follow BMET in a.m.  Constipation:  Colace and Dulcolax    Obesity Estimated body mass index is 38.6 kg/m as calculated from the following:   Height as of this encounter: 5\' 4"  (1.626 m).   Weight as of this encounter: 102 kg.    DVT prophylaxis: Heparin gtt. Will be transitioned to xarelto. Code Status: Full Family Communication:  Sister at bed side, discussed with patient in detail. Disposition Plan:  Status is: Inpatient  Remains inpatient appropriate because:IV treatments appropriate due to intensity of illness or inability to take PO   Dispo: The patient is from: Home              Anticipated d/c is to: SNF              Anticipated d/c date is: 3 days              Patient currently is not medically stable to d/c.    Consultants:    General surgery  Infectious disease  Interventional radiology.  Procedures: Laparotomy, IR attempted drainage. Antimicrobials: Anti-infectives (From admission, onward)   Start     Dose/Rate Route Frequency Ordered Stop   05/13/20 1700  amoxicillin-clavulanate (AUGMENTIN) 875-125 MG per tablet 1 tablet        1 tablet Oral 2 times daily 05/13/20 1541     05/07/20 1000  fluconazole (DIFLUCAN) IVPB 400 mg        400 mg 100 mL/hr over 120 Minutes Intravenous Every 24 hours 05/06/20 2353 05/13/20 1206   05/07/20 0000  piperacillin-tazobactam (ZOSYN) IVPB 3.375 g  Status:  Discontinued        3.375 g 100 mL/hr over 30 Minutes Intravenous Every 8 hours 05/06/20 2353 05/06/20 2355   05/07/20 0000  piperacillin-tazobactam (ZOSYN) IVPB 3.375 g  Status:  Discontinued        3.375 g 12.5 mL/hr over 240 Minutes Intravenous Every 8 hours 05/06/20 2356 05/13/20 1541   05/06/20 1600  piperacillin-tazobactam (ZOSYN) IVPB 3.375 g        3.375 g 100 mL/hr over 30 Minutes  Intravenous  Once 05/06/20 0947 05/06/20 1708   04/26/20 1000  fluconazole (DIFLUCAN) IVPB 400 mg        400 mg 100 mL/hr over 120 Minutes Intravenous Every 24 hours 04/26/20 0907 05/06/20 2359   04/22/20 0100  piperacillin-tazobactam (ZOSYN) IVPB 3.375 g  Status:  Discontinued        3.375 g 12.5 mL/hr over 240 Minutes Intravenous Every 8 hours 04/22/20 0002 05/06/20 0946   04/22/20 0000  piperacillin-tazobactam (ZOSYN) IVPB 3.375 g  Status:  Discontinued        3.375 g 100 mL/hr over 30 Minutes Intravenous Every 8 hours 04/21/20 2349 04/22/20 0002      Subjective: Patient was seen and examined at bedside.  Overnight events noted.  She reports feeling better,  pain is better,  she is able to pass gas but has not had a bowel movement yet. Objective: Vitals:   05/14/20 0001 05/14/20 0426 05/14/20 0751 05/14/20 1132  BP: (!) 151/88 (!) 154/91 (!) 157/86 (!) 143/92  Pulse: 84 80 84 87  Resp: 20 20 17 20   Temp: 98.5 F (36.9 C) 98.7 F (37.1 C) 98.9 F (37.2 C) 98.4 F (36.9 C)  TempSrc: Oral Oral Oral Oral  SpO2: 95% 98% 96% 95%  Weight:      Height:  Intake/Output Summary (Last 24 hours) at 05/14/2020 1246 Last data filed at 05/14/2020 1100 Gross per 24 hour  Intake 2545.95 ml  Output 3600 ml  Net -1054.05 ml   Filed Weights   04/21/20 1352 04/22/20 0037 04/25/20 0424  Weight: 90.7 kg 90.5 kg 102 kg    Examination:  General exam: Appears calm and comfortable, Alert, oriented x 3 Respiratory system: Clear to auscultation. Respiratory effort normal. Cardiovascular system: S1 & S2 heard, RRR. No JVD, murmurs, rubs, gallops or clicks. No pedal edema. Gastrointestinal system: Abdomen is nondistended, soft and Mildly tender in RUQ, No organomegaly or masses felt. Normal bowel sounds heard. Drain noted on left side.  Pinkish colored fluid, minimal output Central nervous system: Alert and oriented. No focal neurological deficits. Extremities: No edema, no clubbing, no  cyanosis. Skin: No rashes, lesions or ulcers Psychiatry: Judgement and insight appear normal. Mood & affect appropriate.     Data Reviewed: I have personally reviewed following labs and imaging studies  CBC: Recent Labs  Lab 05/08/20 0243 05/08/20 2056 05/10/20 0214 05/11/20 0159 05/12/20 0707 05/13/20 0141 05/14/20 0105  WBC 7.3   < > 7.0 7.1 6.8 6.9 8.5  NEUTROABS 5.3  --   --   --   --   --   --   HGB 10.5*   < > 10.1* 10.4* 10.0* 9.6* 9.9*  HCT 31.9*   < > 30.7* 31.7* 30.7* 29.6* 30.1*  MCV 87.2   < > 86.7 87.6 87.2 88.6 88.5  PLT 213   < > 219 200 189 195 220   < > = values in this interval not displayed.   Basic Metabolic Panel: Recent Labs  Lab 05/08/20 0243 05/11/20 0159 05/12/20 0707 05/13/20 0141 05/14/20 0105  NA 136 136 136  --  137  K 3.6 3.6 3.2* 3.7 3.7  CL 100 103 104  --  103  CO2 24 24 23   --  23  GLUCOSE 125* 120* 125*  --  118*  BUN 6* 6* 6*  --  5*  CREATININE 0.79 0.69 0.72  --  0.69  CALCIUM 8.5* 8.6* 8.6*  --  8.7*  MG  --   --  1.7  --  1.8  PHOS  --   --  3.6  --   --    GFR: Estimated Creatinine Clearance: 83.6 mL/min (by C-G formula based on SCr of 0.69 mg/dL). Liver Function Tests: Recent Labs  Lab 05/11/20 0159 05/12/20 0707  AST 60* 41  ALT 25 23  ALKPHOS 51 43  BILITOT 0.5 0.5  PROT 6.5 6.5  ALBUMIN 2.5* 2.5*   No results for input(s): LIPASE, AMYLASE in the last 168 hours. No results for input(s): AMMONIA in the last 168 hours. Coagulation Profile: No results for input(s): INR, PROTIME in the last 168 hours. Cardiac Enzymes: No results for input(s): CKTOTAL, CKMB, CKMBINDEX, TROPONINI in the last 168 hours. BNP (last 3 results) No results for input(s): PROBNP in the last 8760 hours. HbA1C: No results for input(s): HGBA1C in the last 72 hours. CBG: No results for input(s): GLUCAP in the last 168 hours. Lipid Profile: No results for input(s): CHOL, HDL, LDLCALC, TRIG, CHOLHDL, LDLDIRECT in the last 72  hours. Thyroid Function Tests: No results for input(s): TSH, T4TOTAL, FREET4, T3FREE, THYROIDAB in the last 72 hours. Anemia Panel: No results for input(s): VITAMINB12, FOLATE, FERRITIN, TIBC, IRON, RETICCTPCT in the last 72 hours. Sepsis Labs: No results for input(s): PROCALCITON, LATICACIDVEN in the  last 168 hours.  Recent Results (from the past 240 hour(s))  SARS Coronavirus 2 by RT PCR (hospital order, performed in [facility] hospital lab) Nasopharyngeal Nasopharyngeal Swab     Status: None   Collection Time: 05/05/20  6:00 PM   Specimen: Nasopharyngeal Swab  Result Value Ref Range Status   SARS Coronavirus 2 NEGATIVE NEGATIVE Final    Comment: (NOTE) SARS-CoV-2 target nucleic acids are NOT DETECTED.  The SARS-CoV-2 RNA is generally detectable in upper and lower respiratory specimens during the acute phase of infection. The lowest concentration of SARS-CoV-2 viral copies this assay can detect is 250 copies / mL. A negative result does not preclude SARS-CoV-2 infection and should not be used as the sole basis for treatment or other patient management decisions.  A negative result may occur with improper specimen collection / handling, submission of specimen other than nasopharyngeal swab, presence of viral mutation(s) within the areas targeted by this assay, and inadequate number of viral copies (<250 copies / mL). A negative result must be combined with clinical observations, patient history, and epidemiological information.  Fact Sheet for Patients:   StrictlyIdeas.no  Fact Sheet for Healthcare Providers: BankingDealers.co.za  This test is not yet approved or  cleared by the [place] FDA and has been authorized for detection and/or diagnosis of SARS-CoV-2 by FDA under an Emergency Use Authorization (EUA).  This EUA will remain in effect (meaning this test can be used) for the duration of the COVID-19 declaration under  Section 564(b)(1) of the Act, 21 U.S.C. section 360bbb-3(b)(1), unless the authorization is terminated or revoked sooner.  Performed at [facility], [place] [address] [place], [place] [address]   Aerobic/Anaerobic Culture (surgical/deep wound)     Status: None   Collection Time: 05/07/20  5:22 PM   Specimen: Abdomen; Abscess  Result Value Ref Range Status   Specimen Description ABDOMEN ABSCESS  Final   Special Requests   Final    SUB HEPATIC FLUID NEAR PRIOR GASTRIC ULCER PERFORATION   Gram Stain   Final    ABUNDANT WBC PRESENT, PREDOMINANTLY PMN NO ORGANISMS SEEN    Culture   Final    RARE ESCHERICHIA COLI FEW CANDIDA TROPICALIS NO ANAEROBES ISOLATED Performed at [facility], [place] [address], [place], [place] [address]    Report Status 05/12/2020 FINAL  Final   Organism ID, Bacteria ESCHERICHIA COLI  Final      Susceptibility   Escherichia coli - MIC*    AMPICILLIN >=32 RESISTANT Resistant     CEFAZOLIN 16 SENSITIVE Sensitive     CEFEPIME <=0.12 SENSITIVE Sensitive     CEFTAZIDIME <=1 SENSITIVE Sensitive     CEFTRIAXONE <=0.25 SENSITIVE Sensitive     CIPROFLOXACIN <=0.25 SENSITIVE Sensitive     GENTAMICIN <=1 SENSITIVE Sensitive     IMIPENEM <=0.25 SENSITIVE Sensitive     TRIMETH/SULFA <=20 SENSITIVE Sensitive     AMPICILLIN/SULBACTAM >=32 RESISTANT Resistant     PIP/TAZO <=4 SENSITIVE Sensitive     * RARE ESCHERICHIA COLI  Surgical PCR screen     Status: None   Collection Time: 05/08/20 10:33 PM   Specimen: Nasal Mucosa; Nasal Swab  Result Value Ref Range Status   MRSA, PCR NEGATIVE NEGATIVE Final   Staphylococcus aureus NEGATIVE NEGATIVE Final    Comment: (NOTE) The Xpert SA Assay (FDA approved for NASAL specimens in patients 37 years of age and older), is one component of a comprehensive surveillance program. It is not intended to diagnose  infection nor to guide or monitor treatment. Performed at [facility], [place] [address], [place],  [place] [address]      Radiology Studies: CT ABDOMEN PELVIS W CONTRAST  Result Date: 05/13/2020 CLINICAL DATA:  Status post repair of perforated gastric ulcer complicated by intra-abdominal abscesses requiring drainage. Evaluate for recurrent right upper quadrant fluid collection. EXAM: CT ABDOMEN AND PELVIS WITH CONTRAST TECHNIQUE: Multidetector CT imaging of the abdomen and pelvis was performed using the standard protocol following bolus administration of intravenous contrast. CONTRAST:  160mL OMNIPAQUE IOHEXOL 300 MG/ML  SOLN COMPARISON:  CT abdomen pelvis dated May 06, 2020. FINDINGS: Lower chest: Unchanged trace bilateral pleural effusions and bibasilar atelectasis. Hepatobiliary: Small subcapsular fluid collection along the inferior right hepatic lobe has slightly decreased in size, currently measuring 2.3 x 1.7 cm, previously 3.4 x 2.1 cm. Multiple gallstones again noted. No biliary dilatation. Pancreas: Unchanged cystic lesions with associated calcifications within the pancreatic head measuring up to 2.2 cm, better evaluated on recent MRI. No ductal dilatation or surrounding inflammatory changes. Spleen: Normal in size without focal abnormality. Adrenals/Urinary Tract: The adrenal glands are unremarkable. Bilateral parapelvic renal cysts again noted with new high density material within several cysts in the mid and lower pole of the left kidney (series 3, image 41; series 8, image 16). Unchanged 2.2 cm complex cyst in the upper pole of the right kidney is characterized on recent MRI. No renal calculi or hydronephrosis. The bladder is unremarkable. Stomach/Bowel: No oral contrast extravasation. Unchanged small hiatal hernia. The stomach is otherwise within normal limits. No bowel wall thickening, distention, or surrounding inflammatory changes. Sigmoid colonic diverticulosis. Normal appendix. Vascular/Lymphatic: No significant vascular findings are present. No enlarged abdominal or pelvic lymph nodes.  Reproductive: Unchanged fibroid uterus.  No adnexal mass. Other: Reaccumulated subhepatic gas and fluid collection anterior to the gallbladder (series 3, image 31), currently measuring 1.7 x 2.3 cm, previously 1.8 x 3.5 cm. Redemonstrated small tract extending from this collection to the distal stomach (series 3, images 32 and 33). Small midline fluid collection along the upper abdominal incision is not significantly changed (series 3, image 37), currently measuring 1.9 x 0.9 cm, previously 2.0 x 0.9 cm. Suprapubic pelvic fluid collection has nearly resolved status post drainage. The pigtail drainage catheter remains in place. Small volume pneumoperitoneum has resolved. Musculoskeletal: No acute or significant osseous findings. IMPRESSION: 1. Reaccumulated subhepatic gas and fluid collection anterior to the gallbladder, currently measuring 1.7 x 2.3 cm, previously 1.8 x 3.5 cm. Redemonstrated small tract extending from this collection to the distal stomach, concerning for leak. No oral contrast extravasation. 2. Small midline fluid collection along the upper abdominal incision is not significantly changed, currently measuring 1.9 x 0.9 cm, previously 2.0 x 0.9 cm. It is unclear if this communicates with the subhepatic abscess. 3. Suprapubic pelvic fluid collection has nearly resolved status post drainage. Decreasing subcapsular abscess along the inferior right liver. 4. New high density material within several left renal parapelvic cysts, suspicious for spontaneous hemorrhage and potentially related to anticoagulation. No hydronephrosis. Electronically Signed   By: Titus Dubin M.D.   On: 05/13/2020 13:12    Scheduled Meds:  amoxicillin-clavulanate  1 tablet Oral BID   Chlorhexidine Gluconate Cloth  6 each Topical Daily   feeding supplement (ENSURE ENLIVE)  237 mL Oral BID BM   Gerhardt's butt cream   Topical Daily   multivitamin with minerals  1 tablet Oral Daily   pantoprazole  40 mg Oral BID  Continuous Infusions:  sodium chloride 10 mL/hr at 05/07/20 1600   dextrose 5% lactated ringers 75 mL/hr at 05/14/20 0051   heparin 1,300 Units/hr (05/14/20 0508)     LOS: 22 days    Time spent: 25 mins.    Shawna Clamp, MD [facility]   If 7PM-7AM, please contact night-coverage

## 2020-05-14 NOTE — Progress Notes (Signed)
ANTICOAGULATION CONSULT NOTE - [facility] for Heparin>> xarelto Indication: pulmonary embolus  Allergies  Allergen Reactions  . Wheat Extract Swelling    Patient Measurements: Height: 5\' 4"  (162.6 cm) Weight: 102 kg (224 lb 13.9 oz) IBW/kg (Calculated) : 54.7 Heparin Dosing Weight:  78.4 kg  Vital Signs: Temp: 98.4 F (36.9 C) (09/21 1132) Temp Source: Oral (09/21 1132) BP: 143/92 (09/21 1132) Pulse Rate: 87 (09/21 1132)  Labs: Recent Labs    05/12/20 0707 05/12/20 0707 05/13/20 0141 05/13/20 0915 05/13/20 1546 05/14/20 0105 05/14/20 1114  HGB 10.0*   < > 9.6*  --   --  9.9*  --   HCT 30.7*  --  29.6*  --   --  30.1*  --   PLT 189  --  195  --   --  220  --   HEPARINUNFRC 0.50   < > 0.16*   < > 0.49 0.84* 0.60  CREATININE 0.72  --   --   --   --  0.69  --    < > = values in this interval not displayed.    Estimated Creatinine Clearance: 83.6 mL/min (by C-G formula based on SCr of 0.69 mg/dL).   Assessment: 64 yo female on heparin for submassive acute pulmonary embolism/ saddle with multiple bilateral embolus/per CTA 9/8; dopplers negative for DVT on 9/8. Xarelto was started on 9/10 but then was stopped for procedures (last dose 9/13). Patient noted w/ new RUQ collection around site of prior ulcer repair - S/P peptic ulcer perforation -9/14: Pelvic abscess s/p IR drainage  Xarelto to restart 9/21. Discussed with MD since xarelto was started 9/10 and she has been on heparin since then, plans will be to resume xarelto with completion of the load on 10/1. Xarelto 20mg /day to start on 10/2  Goal of Therapy:  Heparin level 0.3-0.7 units/ml aPTT 66-102 seconds Monitor platelets by anticoagulation protocol: Yes   Plan:  Discontinue heparin at [facility] 15mg  po bid with meals until 10/1 then begin 20mg /day on 10/2  Hildred Laser, PharmD Clinical Pharmacist **Pharmacist phone directory can now be found on [email or web address] ([email or web address]).  Listed  under [place].

## 2020-05-14 NOTE — Progress Notes (Signed)
ANTICOAGULATION CONSULT NOTE - Follow Up Consult  Pharmacy Consult for heparin Indication: pulmonary embolus  Labs: Recent Labs    05/12/20 0707 05/12/20 0707 05/13/20 0141 05/13/20 0141 05/13/20 0915 05/13/20 1546 05/14/20 0105  HGB 10.0*   < > 9.6*  --   --   --  9.9*  HCT 30.7*  --  29.6*  --   --   --  30.1*  PLT 189  --  195  --   --   --  220  HEPARINUNFRC 0.50   < > 0.16*   < > 0.49 0.49 0.84*  CREATININE 0.72  --   --   --   --   --  0.69   < > = values in this interval not displayed.    Assessment: 64yo female now supratherapeutic on heparin after two levels at goal; no gtt issues or signs of bleeding per RN.  Goal of Therapy:  Heparin level 0.3-0.7 units/ml   Plan:  Will decrease heparin gtt by 1 unit/kgABW/hr to 1300 units/hr and check level in 6 hours.    Wynona Neat, PharmD, BCPS  05/14/2020,3:45 AM

## 2020-05-15 LAB — CBC
HCT: 30.3 % — ABNORMAL LOW (ref 36.0–46.0)
Hemoglobin: 9.8 g/dL — ABNORMAL LOW (ref 12.0–15.0)
MCH: 28.3 pg (ref 26.0–34.0)
MCHC: 32.3 g/dL (ref 30.0–36.0)
MCV: 87.6 fL (ref 80.0–100.0)
Platelets: 208 10*3/uL (ref 150–400)
RBC: 3.46 MIL/uL — ABNORMAL LOW (ref 3.87–5.11)
RDW: 14.1 % (ref 11.5–15.5)
WBC: 6.8 10*3/uL (ref 4.0–10.5)
nRBC: 0 % (ref 0.0–0.2)

## 2020-05-15 LAB — BASIC METABOLIC PANEL
Anion gap: 12 (ref 5–15)
BUN: 5 mg/dL — ABNORMAL LOW (ref 8–23)
CO2: 22 mmol/L (ref 22–32)
Calcium: 8.8 mg/dL — ABNORMAL LOW (ref 8.9–10.3)
Chloride: 104 mmol/L (ref 98–111)
Creatinine, Ser: 0.67 mg/dL (ref 0.44–1.00)
GFR calc Af Amer: >60 mL/min (ref >60)
GFR calc non Af Amer: >60 mL/min (ref >60)
Glucose, Bld: 128 mg/dL — ABNORMAL HIGH (ref 70–99)
Potassium: 3.7 mmol/L (ref 3.5–5.1)
Sodium: 138 mmol/L (ref 135–145)

## 2020-05-15 LAB — SARS CORONAVIRUS 2 (TAT 6-24 HRS): SARS Coronavirus 2: NEGATIVE

## 2020-05-15 LAB — HEPARIN LEVEL (UNFRACTIONATED): Heparin Unfractionated: >2.2 IU/mL — ABNORMAL HIGH (ref 0.30–0.70)

## 2020-05-15 LAB — MAGNESIUM: Magnesium: 1.7 mg/dL (ref 1.7–2.4)

## 2020-05-15 LAB — PHOSPHORUS: Phosphorus: 4 mg/dL (ref 2.5–4.6)

## 2020-05-15 MED ORDER — CEFDINIR 300 MG PO CAPS
300.0000 mg | ORAL_CAPSULE | Freq: Two times a day (BID) | ORAL | Status: DC
  Administered 2020-05-15 – 2020-05-17 (×5): 300 mg via ORAL
  Filled 2020-05-15 (×5): qty 1

## 2020-05-15 MED ORDER — FLUCONAZOLE 100 MG PO TABS
400.0000 mg | ORAL_TABLET | Freq: Every day | ORAL | Status: DC
  Administered 2020-05-15 – 2020-05-17 (×3): 400 mg via ORAL
  Filled 2020-05-15 (×4): qty 4

## 2020-05-15 NOTE — Progress Notes (Signed)
PROGRESS NOTE    Maria Murphy  HWE:[id number] DOB: 1955/11/19 DOA: 04/21/2020 PCP: Rosita Fire, MD   Brief Narrative:  64 year old female, hospitalizedto [facility] on8/[facility],with the working diagnosis biliary pancreatitis,chronic cholecystitis/cholelithiasis. Patient underwent further work-up with MRCP, which showed free fluid in the abdomen and pneumoperitoneum. Patient was placed on broad-spectrum antibiotic therapy and vasopressors, she underwent emergent laparotomy. Patient was found to have a perforation in the prepyloric area along the lesser curvature of the stomach and antrum, and a Phillip Heal application was performed. Surgical culture positive for Enterococcus and Candida. Patient was stabilized with antibiotic therapy,butshe has developed worsening leukocytosis.  A CT of the abdomen pelvis on 9/7 showed a new 5.3 x 14 x 3.9 cm abscess in the pelvis,5.1 x 2.6 x 2.0 subcapsular fluid collection along the right inferior liver, also concerning for abscess. CT of the chest with multifocal pulmonary embolus, pulmonary emboli arising in the saddledistribution from each main pulmonary artery with extension into multiple lower lobe point arteries bilaterally. Positive right heart strain,consistent with submassive. She underwent 12 F drain placement into the low abdominopelvic fluid collection. Culture positive for enterococcus and candida. Patient has been placed on heparin drip. Echo with preserved RV function  Follow up CT abdomen and pelvis (09/13), after completing 2 weeks of antibiotic therapy with development of 3.4x1.8x.19 right upper abdominal gas and fluid collection in the area of prior pre-pyloric gastric ulcer perforation. New subhepatic abscess, with cutaneous fistula.  General surgery, IR reconsulted.  s/p IR aspiration of the fluid 9/14.  cultures growing E. Coli.  She is continued on Zosyn, transitioned to oral antibiotics, repeat CT scan shows minimal recollection in the  RUQ, too small to place a drain or aspirate, there is no clinical evidence of leakage at the ulcer.  Anticoagulation transitioned to oral anticoagulant. Drain was removed at bed side, General surgery signed off.  Patient will follow up with Dr. Arnoldo Morale outpatient.  Awaiting nursing home placement.  Assessment & Plan:    Principal Problem:   Perforated abdominal viscus Active Problems:   Essential hypertension, benign   Hyperbilirubinemia   Epigastric pain   Perforated viscus   Peptic ulcer with perforation (HCC)   Hypotension   Acute saddle pulmonary embolism without acute cor pulmonale (HCC)   Acute submassive pulmonary embolism/saddle embolism with multiple bilateral embolus/acute hypoxic respiratory failure. -2D echo with preserved LV and RV function. -Patient was placed on IV heparin drip, subsequently transitioned to Xarelto. -Xarelto held due to new abdominal collection/abscess and patient was changed back to heparin drip  -Continue heparin drip per pharmacy protocol, until interventions completed and safe to resume per surgery. -General surgery recommended repeat CT scan with p.o. and IV contrast today to evaluate if collection is re-accumulated. -Xarelto resumed and heparin discontinued. Cleared from surgery.   Active Problems: Perforated gastric ulcer status post ex lap and Graham patch by Dr. Arnoldo Morale 8/30 Pelvic abscess status post IR drainage, cultures Enterococcus, Candida -General surgery and IR following, status post IR aspiration of the fluid 9/14, cultures positive for E. coli, Candida tropicalis -Per surgery, if collection reaccumulate, would be more suggestive of leak, remains high risk for peritonitis.  - repeat CT scan shows minimal recollection in the RUQ, too small to place a drain or aspirate, there is no clinical evidence of leakage at the ulcer. -Continue dressing change, mobilize, underwent exchange of abdominal drain by IR on 9/16 -Continued on  IV Zosyn,  fluconazole, PPI. -She has been tolerating diet, drain output is low.  -Antibiotic  changed to cefdinir and fluconazole 9/22. - recommended 14 days of po Abx. -Drain was removed at bed side by IR 9/22 , General surgery signed off. -Patient will follow up with Dr. Arnoldo Morale outpatient.  Essential hypertension BP remains better controlled. Continue Home BP meds. Hydralazine PRN   History of depression Currently stable.  Hypokalemia - Improved . Replaced,  -Follow BMET in a.m.  Constipation:  Colace and Dulcolax    Obesity Estimated body mass index is 38.6 kg/m as calculated from the following:   Height as of this encounter: 5\' 4"  (1.626 m).   Weight as of this encounter: 102 kg.    DVT prophylaxis: Xarelto Code Status: Full Family Communication:  Sister at bed side, discussed with patient in detail. Disposition Plan:  Status is: Inpatient  Remains inpatient appropriate because:IV treatments appropriate due to intensity of illness or inability to take PO   Dispo: The patient is from: Home              Anticipated d/c is to: SNF              Anticipated d/c date is:1 days              Patient currently is not medically stable to d/c.    Consultants:   General surgery  Infectious disease  Interventional radiology.  Procedures: Laparotomy, IR attempted drainage. Antimicrobials: Anti-infectives (From admission, onward)   Start     Dose/Rate Route Frequency Ordered Stop   05/15/20 1000  fluconazole (DIFLUCAN) tablet 400 mg        400 mg Oral Daily 05/15/20 0917 05/22/20 0959   05/15/20 1000  cefdinir (OMNICEF) capsule 300 mg        300 mg Oral Every 12 hours 05/15/20 0917 05/22/20 0959   05/13/20 1700  amoxicillin-clavulanate (AUGMENTIN) 875-125 MG per tablet 1 tablet  Status:  Discontinued        1 tablet Oral 2 times daily 05/13/20 1541 05/15/20 0917   05/07/20 1000  fluconazole (DIFLUCAN) IVPB 400 mg        400 mg 100 mL/hr over 120 Minutes  Intravenous Every 24 hours 05/06/20 2353 05/13/20 1206   05/07/20 0000  piperacillin-tazobactam (ZOSYN) IVPB 3.375 g  Status:  Discontinued        3.375 g 100 mL/hr over 30 Minutes Intravenous Every 8 hours 05/06/20 2353 05/06/20 2355   05/07/20 0000  piperacillin-tazobactam (ZOSYN) IVPB 3.375 g  Status:  Discontinued        3.375 g 12.5 mL/hr over 240 Minutes Intravenous Every 8 hours 05/06/20 2356 05/13/20 1541   05/06/20 1600  piperacillin-tazobactam (ZOSYN) IVPB 3.375 g        3.375 g 100 mL/hr over 30 Minutes Intravenous  Once 05/06/20 0947 05/06/20 1708   04/26/20 1000  fluconazole (DIFLUCAN) IVPB 400 mg        400 mg 100 mL/hr over 120 Minutes Intravenous Every 24 hours 04/26/20 0907 05/06/20 2359   04/22/20 0100  piperacillin-tazobactam (ZOSYN) IVPB 3.375 g  Status:  Discontinued        3.375 g 12.5 mL/hr over 240 Minutes Intravenous Every 8 hours 04/22/20 0002 05/06/20 0946   04/22/20 0000  piperacillin-tazobactam (ZOSYN) IVPB 3.375 g  Status:  Discontinued        3.375 g 100 mL/hr over 30 Minutes Intravenous Every 8 hours 04/21/20 2349 04/22/20 0002      Subjective: Patient was seen and examined at bedside.  Overnight events noted.  She reports  feeling better,  abdominal pain has improved.  Drain was removed by IR at bedside.  Tolerated well.  She has been tolerating diet.   Objective: Vitals:   05/14/20 2313 05/15/20 0400 05/15/20 0726 05/15/20 1245  BP: 134/79 138/78 138/87 (!) 156/104  Pulse: 91 85 76   Resp: 20 20 (!) 22 20  Temp: 99.1 F (37.3 C) 98.2 F (36.8 C) 98.7 F (37.1 C) 98.7 F (37.1 C)  TempSrc: Oral Oral Oral Oral  SpO2: 94% 95% 94% 95%  Weight:      Height:        Intake/Output Summary (Last 24 hours) at 05/15/2020 1452 Last data filed at 05/15/2020 1103 Gross per 24 hour  Intake 240 ml  Output 2105 ml  Net -1865 ml   Filed Weights   04/21/20 1352 04/22/20 0037 04/25/20 0424  Weight: 90.7 kg 90.5 kg 102 kg    Examination:  General  exam: Appears calm and comfortable, Alert, oriented x 3 Respiratory system: Clear to auscultation. Respiratory effort normal. Cardiovascular system: S1 & S2 heard, RRR. No JVD, murmurs, rubs, gallops or clicks. No pedal edema. Gastrointestinal system: Abdomen is nondistended, soft and non tender , No organomegaly or masses felt. Normal bowel sounds heard. Scars noted. Central nervous system: Alert and oriented. No focal neurological deficits. Extremities: No edema, no clubbing, no cyanosis. Skin: No rashes, lesions or ulcers Psychiatry: Judgement and insight appear normal. Mood & affect appropriate.     Data Reviewed: I have personally reviewed following labs and imaging studies  CBC: Recent Labs  Lab 05/11/20 0159 05/12/20 0707 05/13/20 0141 05/14/20 0105 05/15/20 0359  WBC 7.1 6.8 6.9 8.5 6.8  HGB 10.4* 10.0* 9.6* 9.9* 9.8*  HCT 31.7* 30.7* 29.6* 30.1* 30.3*  MCV 87.6 87.2 88.6 88.5 87.6  PLT 200 189 195 220 182   Basic Metabolic Panel: Recent Labs  Lab 05/11/20 0159 05/12/20 0707 05/13/20 0141 05/14/20 0105 05/15/20 0359  NA 136 136  --  137 138  K 3.6 3.2* 3.7 3.7 3.7  CL 103 104  --  103 104  CO2 24 23  --  23 22  GLUCOSE 120* 125*  --  118* 128*  BUN 6* 6*  --  5* 5*  CREATININE 0.69 0.72  --  0.69 0.67  CALCIUM 8.6* 8.6*  --  8.7* 8.8*  MG  --  1.7  --  1.8 1.7  PHOS  --  3.6  --   --  4.0   GFR: Estimated Creatinine Clearance: 83.6 mL/min (by C-G formula based on SCr of 0.67 mg/dL). Liver Function Tests: Recent Labs  Lab 05/11/20 0159 05/12/20 0707  AST 60* 41  ALT 25 23  ALKPHOS 51 43  BILITOT 0.5 0.5  PROT 6.5 6.5  ALBUMIN 2.5* 2.5*   No results for input(s): LIPASE, AMYLASE in the last 168 hours. No results for input(s): AMMONIA in the last 168 hours. Coagulation Profile: No results for input(s): INR, PROTIME in the last 168 hours. Cardiac Enzymes: No results for input(s): CKTOTAL, CKMB, CKMBINDEX, TROPONINI in the last 168 hours. BNP (last  3 results) No results for input(s): PROBNP in the last 8760 hours. HbA1C: No results for input(s): HGBA1C in the last 72 hours. CBG: No results for input(s): GLUCAP in the last 168 hours. Lipid Profile: No results for input(s): CHOL, HDL, LDLCALC, TRIG, CHOLHDL, LDLDIRECT in the last 72 hours. Thyroid Function Tests: No results for input(s): TSH, T4TOTAL, FREET4, T3FREE, THYROIDAB in the last 72  hours. Anemia Panel: No results for input(s): VITAMINB12, FOLATE, FERRITIN, TIBC, IRON, RETICCTPCT in the last 72 hours. Sepsis Labs: No results for input(s): PROCALCITON, LATICACIDVEN in the last 168 hours.  Recent Results (from the past 240 hour(s))  SARS Coronavirus 2 by RT PCR (hospital order, performed in [facility] hospital lab) Nasopharyngeal Nasopharyngeal Swab     Status: None   Collection Time: 05/05/20  6:00 PM   Specimen: Nasopharyngeal Swab  Result Value Ref Range Status   SARS Coronavirus 2 NEGATIVE NEGATIVE Final    Comment: (NOTE) SARS-CoV-2 target nucleic acids are NOT DETECTED.  The SARS-CoV-2 RNA is generally detectable in upper and lower respiratory specimens during the acute phase of infection. The lowest concentration of SARS-CoV-2 viral copies this assay can detect is 250 copies / mL. A negative result does not preclude SARS-CoV-2 infection and should not be used as the sole basis for treatment or other patient management decisions.  A negative result may occur with improper specimen collection / handling, submission of specimen other than nasopharyngeal swab, presence of viral mutation(s) within the areas targeted by this assay, and inadequate number of viral copies (<250 copies / mL). A negative result must be combined with clinical observations, patient history, and epidemiological information.  Fact Sheet for Patients:   StrictlyIdeas.no  Fact Sheet for Healthcare Providers: BankingDealers.co.za  This test is  not yet approved or  cleared by the [place] FDA and has been authorized for detection and/or diagnosis of SARS-CoV-2 by FDA under an Emergency Use Authorization (EUA).  This EUA will remain in effect (meaning this test can be used) for the duration of the COVID-19 declaration under Section 564(b)(1) of the Act, 21 U.S.C. section 360bbb-3(b)(1), unless the authorization is terminated or revoked sooner.  Performed at [facility], [place] [address] [place], [place] [address]   Aerobic/Anaerobic Culture (surgical/deep wound)     Status: None   Collection Time: 05/07/20  5:22 PM   Specimen: Abdomen; Abscess  Result Value Ref Range Status   Specimen Description ABDOMEN ABSCESS  Final   Special Requests   Final    SUB HEPATIC FLUID NEAR PRIOR GASTRIC ULCER PERFORATION   Gram Stain   Final    ABUNDANT WBC PRESENT, PREDOMINANTLY PMN NO ORGANISMS SEEN    Culture   Final    RARE ESCHERICHIA COLI FEW CANDIDA TROPICALIS NO ANAEROBES ISOLATED Performed at [facility], [place] [address] [place], [place] [address]    Report Status 05/12/2020 FINAL  Final   Organism ID, Bacteria ESCHERICHIA COLI  Final      Susceptibility   Escherichia coli - MIC*    AMPICILLIN >=32 RESISTANT Resistant     CEFAZOLIN 16 SENSITIVE Sensitive     CEFEPIME <=0.12 SENSITIVE Sensitive     CEFTAZIDIME <=1 SENSITIVE Sensitive     CEFTRIAXONE <=0.25 SENSITIVE Sensitive     CIPROFLOXACIN <=0.25 SENSITIVE Sensitive     GENTAMICIN <=1 SENSITIVE Sensitive     IMIPENEM <=0.25 SENSITIVE Sensitive     TRIMETH/SULFA <=20 SENSITIVE Sensitive     AMPICILLIN/SULBACTAM >=32 RESISTANT Resistant     PIP/TAZO <=4 SENSITIVE Sensitive     * RARE ESCHERICHIA COLI  Surgical PCR screen     Status: None   Collection Time: 05/08/20 10:33 PM   Specimen: Nasal Mucosa; Nasal Swab  Result Value Ref Range Status   MRSA, PCR NEGATIVE NEGATIVE Final   Staphylococcus aureus NEGATIVE NEGATIVE Final    Comment: (NOTE) The  Xpert  SA Assay (FDA approved for NASAL specimens in patients 60 years of age and older), is one component of a comprehensive surveillance program. It is not intended to diagnose infection nor to guide or monitor treatment. Performed at [facility], [place] [address], [place], [place] [address]      Radiology Studies: No results found.  Scheduled Meds: . cefdinir  300 mg Oral Q12H  . Chlorhexidine Gluconate Cloth  6 each Topical Daily  . feeding supplement (ENSURE ENLIVE)  237 mL Oral BID BM  . fluconazole  400 mg Oral Daily  . Gerhardt's butt cream   Topical Daily  . multivitamin with minerals  1 tablet Oral Daily  . pantoprazole  40 mg Oral BID  . Rivaroxaban  15 mg Oral BID WC   Followed by  . [START ON 05/25/2020] rivaroxaban  20 mg Oral Q supper   Continuous Infusions: . sodium chloride 10 mL/hr at 05/07/20 1600  . dextrose 5% lactated ringers 75 mL/hr at 05/15/20 0422     LOS: 23 days    Time spent: 25 mins.    Shawna Clamp, MD [facility]   If 7PM-7AM, please contact night-coverage

## 2020-05-15 NOTE — Progress Notes (Signed)
Referring Physician(s): Rosita Fire [facility])  Supervising Physician: Sandi Mariscal  Patient Status:  [facility] - In-pt  Chief Complaint: None  Subjective:  History of ruptured pre-pyloric gastric ulcer s/p ex lap with gastrorrhaphy Phillip Heal patch) complicated by development of pelvic fluid collection s/p pelvic drain placement in IR 05/01/2020; most recently exchanged and upsized in IR 05/09/2020. Patient awake and alert sitting in bed watching TV with no complaints at this time. Pelvic drain site c/d/i.   Allergies: Wheat extract  Medications: Prior to Admission medications   Medication Sig Start Date End Date Taking? Authorizing Provider  acetaminophen (TYLENOL) 500 MG tablet Take 1 tablet (500 mg total) by mouth every 6 (six) hours as needed. 08/10/18  Yes Idol, Almyra Free, PA-C  citalopram (CELEXA) 20 MG tablet Take 1 tablet (20 mg total) by mouth daily. 08/16/19  Yes Jaynee Eagles, PA-C  donepezil (ARICEPT) 10 MG tablet Take 5-10 mg by mouth in the morning and at bedtime.  04/02/20  Yes [provider]  gabapentin (NEURONTIN) 300 MG capsule Take 300 mg by mouth 2 (two) times daily.  08/23/19  Yes [provider]  lisinopril-hydrochlorothiazide (PRINZIDE,ZESTORETIC) 10-12.5 MG tablet Take 1 tablet by mouth daily. 08/07/18  Yes [provider]  traZODone (DESYREL) 100 MG tablet Take 200 mg by mouth at bedtime.  08/02/19  Yes [provider]  Ginger, Zingiber officinalis, (GINGER PO) Take 1 tablet by mouth daily.     [provider]  LORazepam (ATIVAN) 1 MG tablet Take 1 tablet (1 mg total) by mouth every 6 (six) hours as needed for anxiety. 05/06/20   Arrien, Jimmy Picket, MD  methocarbamol (ROBAXIN) 500 MG tablet Take 1 tablet (500 mg total) by mouth every 8 (eight) hours as needed for muscle spasms. Patient not taking: Reported on 04/22/2020 03/26/20   Ezequiel Essex, MD  naproxen (NAPROSYN) 500 MG tablet Take 1 tablet (500 mg total) by mouth 2 (two)  times daily as needed. Patient not taking: Reported on 04/22/2020 03/26/20   Ezequiel Essex, MD  oxyCODONE (OXY IR/ROXICODONE) 5 MG immediate release tablet Take 1 tablet (5 mg total) by mouth every 6 (six) hours as needed for moderate pain or severe pain. 05/06/20   Arrien, Jimmy Picket, MD  pantoprazole (PROTONIX) 40 MG tablet Take 1 tablet (40 mg total) by mouth 2 (two) times daily. 05/06/20 06/05/20  Arrien, Jimmy Picket, MD  Rivaroxaban (XARELTO) 15 MG TABS tablet Take 1 tablet (15 mg total) by mouth 2 (two) times daily with a meal for 17 days. Complete on 05/23/20 05/06/20 05/23/20  Arrien, Jimmy Picket, MD  rivaroxaban (XARELTO) 20 MG TABS tablet Take 1 tablet (20 mg total) by mouth daily with supper. Start 05/24/20 05/24/20   Arrien, Jimmy Picket, MD     Vital Signs: BP 138/87 (BP Location: Left Arm)   Pulse 76   Temp 98.7 F (37.1 C) (Oral)   Resp (!) 22   Ht 5\' 4"  (1.626 m)   Wt 224 lb 13.9 oz (102 kg)   SpO2 94%   BMI 38.60 kg/m   Physical Exam Vitals and nursing note reviewed.  Constitutional:      General: She is not in acute distress. Pulmonary:     Effort: Pulmonary effort is normal. No respiratory distress.  Abdominal:     Comments: Pelvic drain site without tenderness, erythema, drainage, or active bleeding; minimal output of bloody fluid in gravity bag (similar appearing to yesterday) - drain removed intact and pressure dressing applied.  Skin:    General: Skin is warm and dry.  Neurological:     Mental Status: She is alert and oriented to person, place, and time.     Imaging: CT ABDOMEN PELVIS W CONTRAST  Result Date: 05/13/2020 CLINICAL DATA:  Status post repair of perforated gastric ulcer complicated by intra-abdominal abscesses requiring drainage. Evaluate for recurrent right upper quadrant fluid collection. EXAM: CT ABDOMEN AND PELVIS WITH CONTRAST TECHNIQUE: Multidetector CT imaging of the abdomen and pelvis was performed using the standard  protocol following bolus administration of intravenous contrast. CONTRAST:  143mL OMNIPAQUE IOHEXOL 300 MG/ML  SOLN COMPARISON:  CT abdomen pelvis dated May 06, 2020. FINDINGS: Lower chest: Unchanged trace bilateral pleural effusions and bibasilar atelectasis. Hepatobiliary: Small subcapsular fluid collection along the inferior right hepatic lobe has slightly decreased in size, currently measuring 2.3 x 1.7 cm, previously 3.4 x 2.1 cm. Multiple gallstones again noted. No biliary dilatation. Pancreas: Unchanged cystic lesions with associated calcifications within the pancreatic head measuring up to 2.2 cm, better evaluated on recent MRI. No ductal dilatation or surrounding inflammatory changes. Spleen: Normal in size without focal abnormality. Adrenals/Urinary Tract: The adrenal glands are unremarkable. Bilateral parapelvic renal cysts again noted with new high density material within several cysts in the mid and lower pole of the left kidney (series 3, image 41; series 8, image 16). Unchanged 2.2 cm complex cyst in the upper pole of the right kidney is characterized on recent MRI. No renal calculi or hydronephrosis. The bladder is unremarkable. Stomach/Bowel: No oral contrast extravasation. Unchanged small hiatal hernia. The stomach is otherwise within normal limits. No bowel wall thickening, distention, or surrounding inflammatory changes. Sigmoid colonic diverticulosis. Normal appendix. Vascular/Lymphatic: No significant vascular findings are present. No enlarged abdominal or pelvic lymph nodes. Reproductive: Unchanged fibroid uterus.  No adnexal mass. Other: Reaccumulated subhepatic gas and fluid collection anterior to the gallbladder (series 3, image 31), currently measuring 1.7 x 2.3 cm, previously 1.8 x 3.5 cm. Redemonstrated small tract extending from this collection to the distal stomach (series 3, images 32 and 33). Small midline fluid collection along the upper abdominal incision is not significantly  changed (series 3, image 37), currently measuring 1.9 x 0.9 cm, previously 2.0 x 0.9 cm. Suprapubic pelvic fluid collection has nearly resolved status post drainage. The pigtail drainage catheter remains in place. Small volume pneumoperitoneum has resolved. Musculoskeletal: No acute or significant osseous findings. IMPRESSION: 1. Reaccumulated subhepatic gas and fluid collection anterior to the gallbladder, currently measuring 1.7 x 2.3 cm, previously 1.8 x 3.5 cm. Redemonstrated small tract extending from this collection to the distal stomach, concerning for leak. No oral contrast extravasation. 2. Small midline fluid collection along the upper abdominal incision is not significantly changed, currently measuring 1.9 x 0.9 cm, previously 2.0 x 0.9 cm. It is unclear if this communicates with the subhepatic abscess. 3. Suprapubic pelvic fluid collection has nearly resolved status post drainage. Decreasing subcapsular abscess along the inferior right liver. 4. New high density material within several left renal parapelvic cysts, suspicious for spontaneous hemorrhage and potentially related to anticoagulation. No hydronephrosis. Electronically Signed   By: Titus Dubin M.D.   On: 05/13/2020 13:12    Labs:  CBC: Recent Labs    05/12/20 0707 05/13/20 0141 05/14/20 0105 05/15/20 0359  WBC 6.8 6.9 8.5 6.8  HGB 10.0* 9.6* 9.9* 9.8*  HCT 30.7* 29.6* 30.1* 30.3*  PLT 189 195 220 208    COAGS: Recent Labs    05/01/20 0818 05/07/20 1555 05/08/20  1757 05/09/20 0745 05/09/20 1440 05/10/20 0214  INR 1.2  --   --   --   --   --   APTT  --    < > 41* 94* 74* 116*   < > = values in this interval not displayed.    BMP: Recent Labs    05/11/20 0159 05/11/20 0159 05/12/20 0707 05/13/20 0141 05/14/20 0105 05/15/20 0359  NA 136  --  136  --  137 138  K 3.6   < > 3.2* 3.7 3.7 3.7  CL 103  --  104  --  103 104  CO2 24  --  23  --  23 22  GLUCOSE 120*  --  125*  --  118* 128*  BUN 6*  --  6*   --  5* 5*  CALCIUM 8.6*  --  8.6*  --  8.7* 8.8*  CREATININE 0.69  --  0.72  --  0.69 0.67  GFRNONAA >60  --  >60  --  >60 >60  GFRAA >60  --  >60  --  >60 >60   < > = values in this interval not displayed.    LIVER FUNCTION TESTS: Recent Labs    05/02/20 0130 05/07/20 0330 05/11/20 0159 05/12/20 0707  BILITOT 0.7 0.6 0.5 0.5  AST 25 40 60* 41  ALT 15 22 25 23   ALKPHOS 64 54 51 43  PROT 5.4* 6.7 6.5 6.5  ALBUMIN 2.0* 2.4* 2.5* 2.5*    Assessment and Plan:  History of ruptured pre-pyloric gastric ulcer s/p ex lap with gastrorrhaphy Phillip Heal patch) complicated by development of pelvic fluid collection s/p pelvic drain placement in IR 05/01/2020; most recently exchanged and upsized in IR 05/09/2020. Pelvic drain stable with minimal output of bloody fluid in gravity bag (5 cc additional output from drain in past 24 hours per chart)- minimal output x 48-72 hours. Pelvic drain removed at bedside without complications, removed intact, EBL = 0, patient tolerated procedure well, pressure dressing (gauze + tape) applied to site.  Post-wound instructions: 1- Ok to shower 48 hours post- removal. 2- No submerging (swimming, bathing) for 7 days post-removal. 3- Dressing to remain x 48 hours (unless saturated then replace)- no further dressing changes needed after this, ensure area remains clean/dry until fully healed.  Further plans per TRH/CCS- appreciate and agree with management. Please call IR with questions/concerns.   Electronically Signed: Earley Abide, PA-C 05/15/2020, 11:04 AM   I spent a total of 25 Minutes at the the patient's bedside AND on the patient's hospital floor or unit, greater than 50% of which was counseling/coordinating care for pelvic fluid collection s/p pelvic drain placement.

## 2020-05-15 NOTE — Progress Notes (Signed)
Mobility Specialist: Progress Note   05/15/20 1726  Mobility  Activity Ambulated in room  Level of Assistance Moderate assist, patient does 50-74%  Assistive Device Front wheel walker  Distance Ambulated (ft) 30 ft (63ft, seated break, 77ft)  Mobility Response Tolerated fair  Mobility performed by Mobility specialist  Bed Position High-fowlers  $Mobility charge 1 Mobility   Pre-Mobility: 85 HR, 156/93 BP, 95% SpO2 During Mobility: 132 HR Post-Mobility: 112 HR, 141/101 BP, 100% SpO2  Pt c/o of her knee "locking up" and said she needed to sit down. Pt is motivated to walk but says if her knee locks up she needs to sit down. Recommend chair follow if ambulating in the hallway.   [facility] Moder Mobility Specialist

## 2020-05-15 NOTE — Progress Notes (Signed)
[facility] Progress Note  23 Days Post-Op  Subjective: Patient reports abdominal pain is well controlled. She is tolerating PO intake and having bowel function.   Objective: Vital signs in last 24 hours: Temp:  [98.2 F (36.8 C)-99.1 F (37.3 C)] 98.7 F (37.1 C) (09/22 0726) Pulse Rate:  [76-100] 76 (09/22 0726) Resp:  [20-22] 22 (09/22 0726) BP: (134-156)/(78-95) 138/87 (09/22 0726) SpO2:  [94 %-100 %] 94 % (09/22 0726) Last BM Date: 05/12/20  Intake/Output from previous day: 09/21 0701 - 09/22 0700 In: -  Out: 1705 [Urine:1700; Drains:5] Intake/Output this shift: Total I/O In: 240 [P.O.:240] Out: -   PE: General: pleasant, WD,obese female who isin bed in NAD HEENT: Sclera are noninjected. PERRL. Ears and nose without any masses or lesions. Mouth is pink and moist Heart: regular, rate, and rhythm.  Lungs: CTAB, no wheezes, rhonchi, or rales noted. Respiratory effort nonlabored Abd: soft, mildly ttp in RLQ, ND, +BS,midline incision with small opening superiorlywhich is now 2.5 cm in depth and serous appearing drainage, LLQ drain w/ scant fluid   Lab Results:  Recent Labs    05/14/20 0105 05/15/20 0359  WBC 8.5 6.8  HGB 9.9* 9.8*  HCT 30.1* 30.3*  PLT 220 208   BMET Recent Labs    05/14/20 0105 05/15/20 0359  NA 137 138  K 3.7 3.7  CL 103 104  CO2 23 22  GLUCOSE 118* 128*  BUN 5* 5*  CREATININE 0.69 0.67  CALCIUM 8.7* 8.8*   PT/INR No results for input(s): LABPROT, INR in the last 72 hours. CMP     Component Value Date/Time   NA 138 05/15/2020 0359   K 3.7 05/15/2020 0359   CL 104 05/15/2020 0359   CO2 22 05/15/2020 0359   GLUCOSE 128 (H) 05/15/2020 0359   BUN 5 (L) 05/15/2020 0359   CREATININE 0.67 05/15/2020 0359   CALCIUM 8.8 (L) 05/15/2020 0359   PROT 6.5 05/12/2020 0707   ALBUMIN 2.5 (L) 05/12/2020 0707   AST 41 05/12/2020 0707   ALT 23 05/12/2020 0707   ALKPHOS 43 05/12/2020 0707   BILITOT 0.5 05/12/2020 0707    GFRNONAA >60 05/15/2020 0359   GFRAA >60 05/15/2020 0359   Lipase     Component Value Date/Time   LIPASE 33 04/21/2020 1835       Studies/Results: CT ABDOMEN PELVIS W CONTRAST  Result Date: 05/13/2020 CLINICAL DATA:  Status post repair of perforated gastric ulcer complicated by intra-abdominal abscesses requiring drainage. Evaluate for recurrent right upper quadrant fluid collection. EXAM: CT ABDOMEN AND PELVIS WITH CONTRAST TECHNIQUE: Multidetector CT imaging of the abdomen and pelvis was performed using the standard protocol following bolus administration of intravenous contrast. CONTRAST:  138mL OMNIPAQUE IOHEXOL 300 MG/ML  SOLN COMPARISON:  CT abdomen pelvis dated May 06, 2020. FINDINGS: Lower chest: Unchanged trace bilateral pleural effusions and bibasilar atelectasis. Hepatobiliary: Small subcapsular fluid collection along the inferior right hepatic lobe has slightly decreased in size, currently measuring 2.3 x 1.7 cm, previously 3.4 x 2.1 cm. Multiple gallstones again noted. No biliary dilatation. Pancreas: Unchanged cystic lesions with associated calcifications within the pancreatic head measuring up to 2.2 cm, better evaluated on recent MRI. No ductal dilatation or surrounding inflammatory changes. Spleen: Normal in size without focal abnormality. Adrenals/Urinary Tract: The adrenal glands are unremarkable. Bilateral parapelvic renal cysts again noted with new high density material within several cysts in the mid and lower pole of the left kidney (series 3, image 41; series 8,  image 16). Unchanged 2.2 cm complex cyst in the upper pole of the right kidney is characterized on recent MRI. No renal calculi or hydronephrosis. The bladder is unremarkable. Stomach/Bowel: No oral contrast extravasation. Unchanged small hiatal hernia. The stomach is otherwise within normal limits. No bowel wall thickening, distention, or surrounding inflammatory changes. Sigmoid colonic diverticulosis. Normal  appendix. Vascular/Lymphatic: No significant vascular findings are present. No enlarged abdominal or pelvic lymph nodes. Reproductive: Unchanged fibroid uterus.  No adnexal mass. Other: Reaccumulated subhepatic gas and fluid collection anterior to the gallbladder (series 3, image 31), currently measuring 1.7 x 2.3 cm, previously 1.8 x 3.5 cm. Redemonstrated small tract extending from this collection to the distal stomach (series 3, images 32 and 33). Small midline fluid collection along the upper abdominal incision is not significantly changed (series 3, image 37), currently measuring 1.9 x 0.9 cm, previously 2.0 x 0.9 cm. Suprapubic pelvic fluid collection has nearly resolved status post drainage. The pigtail drainage catheter remains in place. Small volume pneumoperitoneum has resolved. Musculoskeletal: No acute or significant osseous findings. IMPRESSION: 1. Reaccumulated subhepatic gas and fluid collection anterior to the gallbladder, currently measuring 1.7 x 2.3 cm, previously 1.8 x 3.5 cm. Redemonstrated small tract extending from this collection to the distal stomach, concerning for leak. No oral contrast extravasation. 2. Small midline fluid collection along the upper abdominal incision is not significantly changed, currently measuring 1.9 x 0.9 cm, previously 2.0 x 0.9 cm. It is unclear if this communicates with the subhepatic abscess. 3. Suprapubic pelvic fluid collection has nearly resolved status post drainage. Decreasing subcapsular abscess along the inferior right liver. 4. New high density material within several left renal parapelvic cysts, suspicious for spontaneous hemorrhage and potentially related to anticoagulation. No hydronephrosis. Electronically Signed   By: Titus Dubin M.D.   On: 05/13/2020 13:12    Anti-infectives: Anti-infectives (From admission, onward)   Start     Dose/Rate Route Frequency Ordered Stop   05/15/20 1000  fluconazole (DIFLUCAN) tablet 400 mg        400 mg Oral  Daily 05/15/20 0917 05/22/20 0959   05/15/20 1000  cefdinir (OMNICEF) capsule 300 mg        300 mg Oral Every 12 hours 05/15/20 0917 05/22/20 0959   05/13/20 1700  amoxicillin-clavulanate (AUGMENTIN) 875-125 MG per tablet 1 tablet  Status:  Discontinued        1 tablet Oral 2 times daily 05/13/20 1541 05/15/20 0917   05/07/20 1000  fluconazole (DIFLUCAN) IVPB 400 mg        400 mg 100 mL/hr over 120 Minutes Intravenous Every 24 hours 05/06/20 2353 05/13/20 1206   05/07/20 0000  piperacillin-tazobactam (ZOSYN) IVPB 3.375 g  Status:  Discontinued        3.375 g 100 mL/hr over 30 Minutes Intravenous Every 8 hours 05/06/20 2353 05/06/20 2355   05/07/20 0000  piperacillin-tazobactam (ZOSYN) IVPB 3.375 g  Status:  Discontinued        3.375 g 12.5 mL/hr over 240 Minutes Intravenous Every 8 hours 05/06/20 2356 05/13/20 1541   05/06/20 1600  piperacillin-tazobactam (ZOSYN) IVPB 3.375 g        3.375 g 100 mL/hr over 30 Minutes Intravenous  Once 05/06/20 0947 05/06/20 1708   04/26/20 1000  fluconazole (DIFLUCAN) IVPB 400 mg        400 mg 100 mL/hr over 120 Minutes Intravenous Every 24 hours 04/26/20 0907 05/06/20 2359   04/22/20 0100  piperacillin-tazobactam (ZOSYN) IVPB 3.375 g  Status:  Discontinued        3.375 g 12.5 mL/hr over 240 Minutes Intravenous Every 8 hours 04/22/20 0002 05/06/20 0946   04/22/20 0000  piperacillin-tazobactam (ZOSYN) IVPB 3.375 g  Status:  Discontinued        3.375 g 100 mL/hr over 30 Minutes Intravenous Every 8 hours 04/21/20 2349 04/22/20 0002       Assessment/Plan HTN Depression Acute PE - xarelto   S/P exploratory laparotomy and Graham patch for perforated gastric ulcer by Dr. Arnoldo Morale 8/30 Pelvic abscess s/p IR drainage New RUQ collection around site of prior ulcer repair - concern for possible anastomotic leak although no contrast extrav noted on CT -s/p IR aspiration of fluid 9/14, cultures with E coli - continue Zosyn - repeat CT showed persistent  collection but no contrast extrav  - clinically she is doing well and has no clinical evidence of leak at the ulcer - recommend total of 14 days PO abx  - IR planning to possibly remove LLQ drain today -continue daily iodoform packing to midline wound until tract closes - stable for discharge to SNF from a surgical standpoint and recommend follow up with Dr. Arnoldo Morale in [place] - general surgery will sign off, please call if we can be of further assistance   HUD:JSHF diet  VTE: xarelto  ID: IV Zosyn/diflucan stopped 9/21; PO omnicef/diflucan 9/21>> Follow up: Dr. Arnoldo Morale  LOS: 23 days    Norm Parcel , [facility] Surgery 05/15/2020, 9:46 AM Please see Amion for pager number during day hours 7:00am-4:30pm

## 2020-05-16 LAB — HEMOGLOBIN AND HEMATOCRIT, BLOOD
HCT: 29.9 % — ABNORMAL LOW (ref 36.0–46.0)
Hemoglobin: 9.8 g/dL — ABNORMAL LOW (ref 12.0–15.0)

## 2020-05-16 NOTE — Progress Notes (Signed)
Occupational Therapy Treatment Patient Details Name: Maria Murphy MRN: [id number] DOB: [birth year] Today's Date: 05/16/2020    History of present illness Pt adm to [facility] on 8/29 with biliary pancreatitis, chronic cholecystitis. Pt found to have gastric perforation and underwent emergent laparotomy for repair. On 9/7 pt found to have pelvic abscesses and drain placed. 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 Transferred to [facility] on 9/8.  PMH - HTN, foot ulcer, memory loss, obesity.   OT comments  Pt progressing towards OT goals this session, abdomen continues to be painful, but willing to perform seated level grooming EOB and SPT with mod A. Family present throughout session and encouraging. POC remains appropriate at this time.    Follow Up Recommendations  SNF;Supervision/Assistance - 24 hour    Equipment Recommendations  Other (comment) (defer to next venue of care)    Recommendations for Other Services      Precautions / Restrictions Precautions Precautions: Fall Precaution Comments: painful abdominal wound, premedicate Restrictions Weight Bearing Restrictions: No       Mobility Bed Mobility Overal bed mobility: Needs Assistance Bed Mobility: Rolling;Sidelying to Sit Rolling: Mod assist Sidelying to sit: Mod assist       General bed mobility comments: Assist to elevate trunk into sitting  Transfers Overall transfer level: Needs assistance Equipment used: 1 person hand held assist Transfers: Sit to/from Stand Sit to Stand: Mod assist Stand pivot transfers: Mod assist       General transfer comment: Assist to bring hips up and for balance. Pt has limited rt knee flexion which means she cannot bring rt foot back in under her in preparation for standing    Balance Overall balance assessment: Needs assistance Sitting-balance support: Feet supported;No upper extremity supported Sitting balance-Leahy Scale: Fair Sitting balance - Comments: Sat EOB x 10  minutes   Standing balance support: During functional activity;Bilateral upper extremity supported Standing balance-Leahy Scale: Poor                             ADL either performed or assessed with clinical judgement   ADL Overall ADL's : Needs assistance/impaired     Grooming: Wash/dry face;Sitting;Set up;Oral care Grooming Details (indicate cue type and reason): EOB             Lower Body Dressing: Maximal assistance;Sit to/from stand   Toilet Transfer: Moderate assistance;Stand-pivot Toilet Transfer Details (indicate cue type and reason): face to face                 Vision       Perception     Praxis      Cognition Arousal/Alertness: Awake/alert Behavior During Therapy: WFL for tasks assessed/performed Overall Cognitive Status: Within Functional Limits for tasks assessed                                          Exercises     Shoulder Instructions       General Comments VSS on RA throughout session, family present during session    Pertinent Vitals/ Pain       Pain Assessment: Faces Faces Pain Scale: Hurts even more Pain Location: abdomen with mobility Pain Descriptors / Indicators: Grimacing;Guarding Pain Intervention(s): Monitored during session;Repositioned  Home Living  Prior Functioning/Environment              Frequency  Min 2X/week        Progress Toward Goals  OT Goals(current goals can now be found in the care plan section)  Progress towards OT goals: Progressing toward goals  Acute Rehab OT Goals Patient Stated Goal: decrease pain OT Goal Formulation: With patient Time For Goal Achievement: 05/22/20 Potential to Achieve Goals: Good  Plan Discharge plan remains appropriate    Co-evaluation                 AM-PAC OT "6 Clicks" Daily Activity     Outcome Measure   Help from another person eating meals?: A Little Help  from another person taking care of personal grooming?: A Little Help from another person toileting, which includes using toliet, bedpan, or urinal?: A Lot Help from another person bathing (including washing, rinsing, drying)?: A Lot Help from another person to put on and taking off regular upper body clothing?: A Lot Help from another person to put on and taking off regular lower body clothing?: Total 6 Click Score: 13    End of Session Equipment Utilized During Treatment: Gait belt  OT Visit Diagnosis: Unsteadiness on feet (R26.81);Other abnormalities of gait and mobility (R26.89);Pain;Muscle weakness (generalized) (M62.81) Pain - part of body:  (abdomen)   Activity Tolerance Patient limited by pain   Patient Left in bed;with call bell/phone within reach;with bed alarm set;with family/visitor present   Nurse Communication Mobility status        Time: 1120-1145 OT Time Calculation (min): 25 min  Charges: OT General Charges $OT Visit: 1 Visit OT Treatments $Self Care/Home Management : 8-22 mins $Therapeutic Activity: 8-22 mins  Jesse Sans OTR/L Acute Rehabilitation Services Pager: [phone] Office: [place] 05/16/2020, 1:18 PM

## 2020-05-16 NOTE — TOC Progression Note (Signed)
Transition of Care [facility]) - Progression Note    Patient Details  Name: Maria Murphy MRN: [id number] Date of Birth: [birth year]  Transition of Care [facility]) CM/SW [place], [place] Phone Number: 05/16/2020, 11:01 AM  Clinical Narrative:     CSW visit with patient at bedside. Patient agreeable to SNF placement but has declined SNF bed with [facility]. Patient inquired about [facility].   CSW contacted [facility], spoke with Jackelyn Poling- she will review and call CSW back.   Thurmond Butts, MSW, [place] Clinical Social Worker   Expected Discharge Plan: Skilled Nursing Facility Barriers to Discharge: Continued Medical Work up  Expected Discharge Plan and Services Expected Discharge Plan: [place] In-house Referral: Clinical Social Work   Post Acute Care Choice: [place] Living arrangements for the past 2 months: Apartment Expected Discharge Date: 05/06/20                                     Social Determinants of Health (SDOH) Interventions    Readmission Risk Interventions Readmission Risk Prevention Plan 04/29/2020  Medication Screening Complete  Transportation Screening Complete  Some recent data might be hidden

## 2020-05-16 NOTE — Progress Notes (Signed)
PROGRESS NOTE    Maria Murphy  BZJ:[id number] DOB: 1956/06/13 DOA: 04/21/2020 PCP: Rosita Fire, MD   Brief Narrative:  64 year old female, hospitalizedto [facility] on[facility],with the working diagnosis biliary pancreatitis,chronic cholecystitis/cholelithiasis. Patient underwent further work-up with MRCP, which showed free fluid in the abdomen and pneumoperitoneum. Patient was placed on broad-spectrum antibiotic therapy and vasopressors, she underwent emergent laparotomy. Patient was found to have a perforation in the prepyloric area along the lesser curvature of the stomach and antrum, and a Phillip Heal application was performed. Surgical culture positive for Enterococcus and Candida. Patient was stabilized with antibiotic therapy,butshe has developed worsening leukocytosis.  A CT of the abdomen pelvis on 9/7 showed a new 5.3 x 14 x 3.9 cm abscess in the pelvis,5.1 x 2.6 x 2.0 subcapsular fluid collection along the right inferior liver, also concerning for abscess. CT of the chest with multifocal pulmonary embolus, pulmonary emboli arising in the saddledistribution from each main pulmonary artery with extension into multiple lower lobe point arteries bilaterally. Positive right heart strain,consistent with submassive. She underwent 12 F drain placement into the low abdominopelvic fluid collection. Culture positive for enterococcus and candida. Patient has been placed on heparin drip. Echo with preserved RV function  Follow up CT abdomen and pelvis (09/13), after completing 2 weeks of antibiotic therapy with development of 3.4x1.8x.19 right upper abdominal gas and fluid collection in the area of prior pre-pyloric gastric ulcer perforation. New subhepatic abscess, with cutaneous fistula.  General surgery, IR reconsulted.  s/p IR aspiration of the fluid 9/14.  cultures growing E. Coli.  She is continued on Zosyn, transitioned to oral antibiotics, repeat CT scan shows minimal recollection in the  RUQ, too small to place a drain or aspirate, there is no clinical evidence of leakage at the ulcer.  Anticoagulation transitioned to oral anticoagulant. Drain was removed at bed side, General surgery signed off.  Patient will follow up with Dr. Arnoldo Morale outpatient.  Awaiting nursing home placement.  Assessment & Plan:    Principal Problem:   Perforated abdominal viscus Active Problems:   Essential hypertension, benign   Hyperbilirubinemia   Epigastric pain   Perforated viscus   Peptic ulcer with perforation (HCC)   Hypotension   Acute saddle pulmonary embolism without acute cor pulmonale (HCC)   Acute submassive pulmonary embolism/saddle embolism with multiple bilateral embolus/acute hypoxic respiratory failure. -2D echo with preserved LV and RV function. -Patient was placed on IV heparin drip, subsequently transitioned to Xarelto. -Xarelto held due to new abdominal collection/abscess and patient was changed back to heparin drip  -Continue heparin drip per pharmacy protocol, until interventions completed and safe to resume per surgery. -General surgery recommended repeat CT scan with p.o. and IV contrast today to evaluate if collection is re-accumulated. -Xarelto resumed and heparin discontinued. Cleared from surgery.   Active Problems: Perforated gastric ulcer status post ex lap and Graham patch by Dr. Arnoldo Morale 8/30 Pelvic abscess status post IR drainage, cultures Enterococcus, Candida -General surgery and IR following, status post IR aspiration of the fluid 9/14, cultures positive for E. coli, Candida tropicalis -Per surgery, if collection reaccumulate, would be more suggestive of leak, remains high risk for peritonitis.  - repeat CT scan shows minimal recollection in the RUQ, too small to place a drain or aspirate, there is no clinical evidence of leakage at the ulcer. -Continue dressing change, mobilize, underwent exchange of abdominal drain by IR on 9/16 -Continued on  IV Zosyn,  fluconazole, PPI. -She has been tolerating diet, drain output is low.  -Antibiotic  changed to cefdinir and fluconazole 9/22. - recommended 14 days of po Abx. -Drain was removed at bed side by IR 9/22 , General surgery signed off. -Patient will follow up with Dr. Arnoldo Morale outpatient.  Essential hypertension BP remains better controlled. Continue Home BP meds. Hydralazine PRN   History of depression Currently stable.  Hypokalemia - Improved . Replaced,  -Follow BMET in a.m.  Constipation:  Colace and Dulcolax    Obesity Estimated body mass index is 38.6 kg/m as calculated from the following:   Height as of this encounter: 5\' 4"  (1.626 m).   Weight as of this encounter: 102 kg.    DVT prophylaxis: Xarelto Code Status: Full Family Communication:  Sister at bed side, discussed with patient in detail. Disposition Plan:  Status is: Inpatient  Remains inpatient appropriate because:IV treatments appropriate due to intensity of illness or inability to take PO   Dispo: The patient is from: Home              Anticipated d/c is to: SNF              Anticipated d/c date is:1 days              Patient currently is not medically stable to d/c.    Consultants:   General surgery  Infectious disease  Interventional radiology.  Procedures: Laparotomy, IR attempted drainage. Antimicrobials: Anti-infectives (From admission, onward)   Start     Dose/Rate Route Frequency Ordered Stop   05/15/20 1000  fluconazole (DIFLUCAN) tablet 400 mg        400 mg Oral Daily 05/15/20 0917 05/22/20 0959   05/15/20 1000  cefdinir (OMNICEF) capsule 300 mg        300 mg Oral Every 12 hours 05/15/20 0917 05/22/20 0959   05/13/20 1700  amoxicillin-clavulanate (AUGMENTIN) 875-125 MG per tablet 1 tablet  Status:  Discontinued        1 tablet Oral 2 times daily 05/13/20 1541 05/15/20 0917   05/07/20 1000  fluconazole (DIFLUCAN) IVPB 400 mg        400 mg 100 mL/hr over 120 Minutes  Intravenous Every 24 hours 05/06/20 2353 05/13/20 1206   05/07/20 0000  piperacillin-tazobactam (ZOSYN) IVPB 3.375 g  Status:  Discontinued        3.375 g 100 mL/hr over 30 Minutes Intravenous Every 8 hours 05/06/20 2353 05/06/20 2355   05/07/20 0000  piperacillin-tazobactam (ZOSYN) IVPB 3.375 g  Status:  Discontinued        3.375 g 12.5 mL/hr over 240 Minutes Intravenous Every 8 hours 05/06/20 2356 05/13/20 1541   05/06/20 1600  piperacillin-tazobactam (ZOSYN) IVPB 3.375 g        3.375 g 100 mL/hr over 30 Minutes Intravenous  Once 05/06/20 0947 05/06/20 1708   04/26/20 1000  fluconazole (DIFLUCAN) IVPB 400 mg        400 mg 100 mL/hr over 120 Minutes Intravenous Every 24 hours 04/26/20 0907 05/06/20 2359   04/22/20 0100  piperacillin-tazobactam (ZOSYN) IVPB 3.375 g  Status:  Discontinued        3.375 g 12.5 mL/hr over 240 Minutes Intravenous Every 8 hours 04/22/20 0002 05/06/20 0946   04/22/20 0000  piperacillin-tazobactam (ZOSYN) IVPB 3.375 g  Status:  Discontinued        3.375 g 100 mL/hr over 30 Minutes Intravenous Every 8 hours 04/21/20 2349 04/22/20 0002      Subjective: Patient was seen and examined at bedside.  Overnight events noted.  She reports  feeling much better, but reports pain in her back.  Describes pain as needles.  Drain was removed yesterday,  there is no bleeding noted.  She denies any nausea, vomiting or diarrhea.  Objective: Vitals:   05/16/20 0349 05/16/20 0804 05/16/20 1110 05/16/20 1116  BP: (!) 142/79 (!) 139/92 (!) 141/87 (!) 157/93  Pulse: 80 95 87   Resp: 20 20 16    Temp: 98.4 F (36.9 C) 98.2 F (36.8 C) 98.2 F (36.8 C)   TempSrc: Oral Oral Oral   SpO2: 96% 97% 98%   Weight:      Height:        Intake/Output Summary (Last 24 hours) at 05/16/2020 1523 Last data filed at 05/16/2020 1100 Gross per 24 hour  Intake --  Output 3000 ml  Net -3000 ml   Filed Weights   04/21/20 1352 04/22/20 0037 04/25/20 0424  Weight: 90.7 kg 90.5 kg 102 kg     Examination:  General exam: Appears calm and comfortable, Alert, oriented x 3 Respiratory system: Clear to auscultation. Respiratory effort normal. Cardiovascular system: S1 & S2 heard, RRR. No JVD, murmurs, rubs, gallops or clicks. No pedal edema. Gastrointestinal system: Abdomen is nondistended, soft and non tender , No organomegaly or masses felt. Normal bowel sounds heard. Scars noted. Central nervous system: Alert and oriented. No focal neurological deficits. Extremities: No edema, no clubbing, no cyanosis. Skin: No rashes, lesions or ulcers, dressing noted over drain removal area. Psychiatry: Judgement and insight appear normal. Mood & affect appropriate.     Data Reviewed: I have personally reviewed following labs and imaging studies  CBC: Recent Labs  Lab 05/11/20 0159 05/11/20 0159 05/12/20 0707 05/13/20 0141 05/14/20 0105 05/15/20 0359 05/16/20 0239  WBC 7.1  --  6.8 6.9 8.5 6.8  --   HGB 10.4*   < > 10.0* 9.6* 9.9* 9.8* 9.8*  HCT 31.7*   < > 30.7* 29.6* 30.1* 30.3* 29.9*  MCV 87.6  --  87.2 88.6 88.5 87.6  --   PLT 200  --  189 195 220 208  --    < > = values in this interval not displayed.   Basic Metabolic Panel: Recent Labs  Lab 05/11/20 0159 05/12/20 0707 05/13/20 0141 05/14/20 0105 05/15/20 0359  NA 136 136  --  137 138  K 3.6 3.2* 3.7 3.7 3.7  CL 103 104  --  103 104  CO2 24 23  --  23 22  GLUCOSE 120* 125*  --  118* 128*  BUN 6* 6*  --  5* 5*  CREATININE 0.69 0.72  --  0.69 0.67  CALCIUM 8.6* 8.6*  --  8.7* 8.8*  MG  --  1.7  --  1.8 1.7  PHOS  --  3.6  --   --  4.0   GFR: Estimated Creatinine Clearance: 83.6 mL/min (by C-G formula based on SCr of 0.67 mg/dL). Liver Function Tests: Recent Labs  Lab 05/11/20 0159 05/12/20 0707  AST 60* 41  ALT 25 23  ALKPHOS 51 43  BILITOT 0.5 0.5  PROT 6.5 6.5  ALBUMIN 2.5* 2.5*   No results for input(s): LIPASE, AMYLASE in the last 168 hours. No results for input(s): AMMONIA in the last 168  hours. Coagulation Profile: No results for input(s): INR, PROTIME in the last 168 hours. Cardiac Enzymes: No results for input(s): CKTOTAL, CKMB, CKMBINDEX, TROPONINI in the last 168 hours. BNP (last 3 results) No results for input(s): PROBNP in the last 8760 hours.  HbA1C: No results for input(s): HGBA1C in the last 72 hours. CBG: No results for input(s): GLUCAP in the last 168 hours. Lipid Profile: No results for input(s): CHOL, HDL, LDLCALC, TRIG, CHOLHDL, LDLDIRECT in the last 72 hours. Thyroid Function Tests: No results for input(s): TSH, T4TOTAL, FREET4, T3FREE, THYROIDAB in the last 72 hours. Anemia Panel: No results for input(s): VITAMINB12, FOLATE, FERRITIN, TIBC, IRON, RETICCTPCT in the last 72 hours. Sepsis Labs: No results for input(s): PROCALCITON, LATICACIDVEN in the last 168 hours.  Recent Results (from the past 240 hour(s))  Aerobic/Anaerobic Culture (surgical/deep wound)     Status: None   Collection Time: 05/07/20  5:22 PM   Specimen: Abdomen; Abscess  Result Value Ref Range Status   Specimen Description ABDOMEN ABSCESS  Final   Special Requests   Final    SUB HEPATIC FLUID NEAR PRIOR GASTRIC ULCER PERFORATION   Gram Stain   Final    ABUNDANT WBC PRESENT, PREDOMINANTLY PMN NO ORGANISMS SEEN    Culture   Final    RARE ESCHERICHIA COLI FEW CANDIDA TROPICALIS NO ANAEROBES ISOLATED Performed at [facility], [place] [address], [place], [place] [address]    Report Status 05/12/2020 FINAL  Final   Organism ID, Bacteria ESCHERICHIA COLI  Final      Susceptibility   Escherichia coli - MIC*    AMPICILLIN >=32 RESISTANT Resistant     CEFAZOLIN 16 SENSITIVE Sensitive     CEFEPIME <=0.12 SENSITIVE Sensitive     CEFTAZIDIME <=1 SENSITIVE Sensitive     CEFTRIAXONE <=0.25 SENSITIVE Sensitive     CIPROFLOXACIN <=0.25 SENSITIVE Sensitive     GENTAMICIN <=1 SENSITIVE Sensitive     IMIPENEM <=0.25 SENSITIVE Sensitive     TRIMETH/SULFA <=20 SENSITIVE Sensitive      AMPICILLIN/SULBACTAM >=32 RESISTANT Resistant     PIP/TAZO <=4 SENSITIVE Sensitive     * RARE ESCHERICHIA COLI  Surgical PCR screen     Status: None   Collection Time: 05/08/20 10:33 PM   Specimen: Nasal Mucosa; Nasal Swab  Result Value Ref Range Status   MRSA, PCR NEGATIVE NEGATIVE Final   Staphylococcus aureus NEGATIVE NEGATIVE Final    Comment: (NOTE) The Xpert SA Assay (FDA approved for NASAL specimens in patients 71 years of age and older), is one component of a comprehensive surveillance program. It is not intended to diagnose infection nor to guide or monitor treatment. Performed at [facility], [place] [address] [place], [place] [address]   SARS CORONAVIRUS 2 (TAT 6-24 HRS) Nasopharyngeal Nasopharyngeal Swab     Status: None   Collection Time: 05/15/20  5:50 PM   Specimen: Nasopharyngeal Swab  Result Value Ref Range Status   SARS Coronavirus 2 NEGATIVE NEGATIVE Final    Comment: (NOTE) SARS-CoV-2 target nucleic acids are NOT DETECTED.  The SARS-CoV-2 RNA is generally detectable in upper and lower respiratory specimens during the acute phase of infection. Negative results do not preclude SARS-CoV-2 infection, do not rule out co-infections with other pathogens, and should not be used as the sole basis for treatment or other patient management decisions. Negative results must be combined with clinical observations, patient history, and epidemiological information. The expected result is Negative.  Fact Sheet for Patients: SugarRoll.be  Fact Sheet for Healthcare Providers: https://www.woods-mathews.com/  This test is not yet approved or cleared by the [place] FDA and  has been authorized for detection and/or diagnosis of SARS-CoV-2 by FDA under an Emergency Use Authorization (EUA). This EUA will remain  in effect (meaning this test can be used) for the duration of the COVID-19 declaration under Se ction 564(b)(1) of  the Act, 21 U.S.C. section 360bbb-3(b)(1), unless the authorization is terminated or revoked sooner.  Performed at [facility], [place] [address] [place], [place] [address]      Radiology Studies: No results found.  Scheduled Meds: . cefdinir  300 mg Oral Q12H  . Chlorhexidine Gluconate Cloth  6 each Topical Daily  . feeding supplement (ENSURE ENLIVE)  237 mL Oral BID BM  . fluconazole  400 mg Oral Daily  . Gerhardt's butt cream   Topical Daily  . multivitamin with minerals  1 tablet Oral Daily  . pantoprazole  40 mg Oral BID  . Rivaroxaban  15 mg Oral BID WC   Followed by  . [START ON 05/25/2020] rivaroxaban  20 mg Oral Q supper   Continuous Infusions: . sodium chloride 10 mL/hr at 05/07/20 1600  . dextrose 5% lactated ringers 75 mL/hr at 05/15/20 1755     LOS: 24 days    Time spent: 25 mins.    Shawna Clamp, MD [facility]   If 7PM-7AM, please contact night-coverage

## 2020-05-17 MED ORDER — FLUCONAZOLE 200 MG PO TABS
400.0000 mg | ORAL_TABLET | Freq: Every day | ORAL | 0 refills | Status: DC
Start: 2020-05-18 — End: 2020-07-06

## 2020-05-17 MED ORDER — CEFDINIR 300 MG PO CAPS
300.0000 mg | ORAL_CAPSULE | Freq: Two times a day (BID) | ORAL | 0 refills | Status: DC
Start: 2020-05-17 — End: 2020-05-26

## 2020-05-17 NOTE — Progress Notes (Signed)
Discharge instructions provided to patient. Follow-up appointments and medication changes reviewed. All questions answered. IVs removed. Patient to be escorted home by sister.   Gailen Shelter RN

## 2020-05-17 NOTE — Discharge Summary (Addendum)
Physician Discharge Summary  Maria Murphy RSW:[id number] DOB: 01/11/1956 DOA: 04/21/2020  PCP: Rosita Fire, MD  Admit date: 04/21/2020.  Discharge date: 05/17/2020  Admitted From: Home.  Disposition:   Home with home services.  Recommendations for Outpatient Follow-up:  1. Follow up with PCP in 1-2 weeks. 2. Please obtain BMP/CBC in one week. 3.   Advised to follow-up with general surgery Dr. Arnoldo Morale in [place] as scheduled. 4.   Advised to take cefdinir twice a day for 10 days to complete 14-day course for oral antibiotics. 5.   Advised to take Diflucan 400 mg daily for 4 more days to complete 7-day treatment for Candida. 6.   Advised to continue Xarelto as directed for pulmonary embolism.  Home Health: None. Equipment/Devices: None  Discharge Condition: Stable CODE STATUS:Full code Diet recommendation: Heart Healthy   Brief Summary / Hospital course: 64 year old female, [facility] on[facility],with the working diagnosis biliary pancreatitis,chronic cholecystitis/cholelithiasis. Patient underwent further work-up with MRCP, which showed free fluid in the abdomen and pneumoperitoneum. Patient was placed on broad-spectrum antibiotic therapy and vasopressors, She underwent emergent laparotomy. Patient was found to have perforation in the prepyloric area along the lesser curvature of the stomach and antrum, and a Phillip Heal application was performed. Surgical culture were positive for Enterococcus and Candida. Patient was stabilized with antibiotic therapy,butshe has developed worsening leukocytosis. A CT of the abdomen pelvis on 9/7 showed a new 5.3 x 14 x 3.9 cm abscess in the pelvis,5.1 x 2.6 x 2.0 subcapsular fluid collection along the right inferior liver, also concerning for abscess. CT of the chest with multifocal pulmonary embolus, pulmonary emboli arising in the saddledistribution from each main pulmonary artery with extension into multiple lower lobe  point arteries bilaterally. Positive right heart strain,consistent with submassive PE. She underwent 12 F drain placement into the lower abdominopelvic fluid collection. Culture positive for enterococcus and candida. Patient has been placed on heparin drip. Echo with preserved RV function. Patient was transferred to [facility] for further management.  Follow up CT abdomen and pelvis (09/13), after completion of 2 weeks of antibiotic therapy with development of 3.4x1.8x.19 right upper abdominal gas and fluid collection in the area of prior pre-pyloric gastric ulcer perforation. New subhepatic abscess, with cutaneous fistula.  General surgery, IR reconsulted.  s/p IR aspiration of the fluid 9/14.  cultures growing E. Coli.  She is continued on Zosyn, transitioned to oral antibiotics, repeat CT scan shows minimal recollection in the RUQ, too small to place a drain or aspirate, there is no clinical evidence of leakage at the ulcer.  Anticoagulation transitioned to oral anticoagulant. Drain was removed at bed side, General surgery signed off. Patient will follow up with Dr. Arnoldo Morale outpatient.  PT recommended SNF , Patient refused to go to nursing home instead she prefers to go home home.  Home health services has been arranged.  She was managed for below problems.  Discharge Diagnoses:  Principal Problem:   Perforated abdominal viscus Active Problems:   Essential hypertension, benign   Hyperbilirubinemia   Epigastric pain   Perforated viscus   Peptic ulcer with perforation (HCC)   Hypotension   Acute saddle pulmonary embolism without acute cor pulmonale (HCC)   Acute submassive pulmonary embolism/saddle embolism with multiple bilateral embolus/acute hypoxic respiratory failure. -2D echo with preserved LV and RV function. -Patient was placed on IV heparin drip, subsequently transitioned to Xarelto. -Xarelto held due to new abdominal collection/abscess and patient was changed back to  heparin drip  -Continue  heparin drip per pharmacy protocol, until interventions completed and safe to resume per surgery. -General surgery recommended repeat CT scan with p.o. and IV contrast today to evaluate if collection is re-accumulated. -Xarelto resumed and heparin discontinued. Cleared from surgery.   Active Problems: Perforated gastric ulcer status post ex lap and Graham patch by Dr. Arnoldo Morale 8/30 Pelvic abscess status post IR drainage, cultures Enterococcus, Candida -General surgery and IR following, status post IR aspiration of the fluid 9/14,cultures positive for E. coli, Candida tropicalis -Per surgery, if collection reaccumulate, would be more suggestive of leak, remains high risk for peritonitis. - repeat CT scan shows minimal recollection in the RUQ, too small to place a drain or aspirate, there is no clinical evidence of leakage at the ulcer. -Continue dressing change, mobilize, underwent exchange of abdominal drainby IR on 9/16 -Continued on  IV Zosyn, fluconazole, PPI. -She has been tolerating diet, drain output is low.  -Antibiotic changed to cefdinir and fluconazole 9/22. - recommended 14 days of po Abx. -Drain was removed at bed side by IR 9/22 , General surgery signed off. -Patient will follow up with Dr. Arnoldo Morale outpatient.  Essential hypertension BP remains better controlled. Continue Home BP meds. Hydralazine PRN   History of depression Currently stable.  Hypokalemia - Improved . Replaced,  -Follow BMET in a.m.  Constipation: Colace and Dulcolax   Obesity Estimated body mass index is 38.6 kg/m as calculated from the following: Height as of this encounter: 5\' 4"  (1.626 m). Weight as of this encounter: 102 kg.   Discharge Instructions  Discharge Instructions    Call MD for:  difficulty breathing, headache or visual disturbances   Complete by: As directed    Call MD for:  persistant dizziness or light-headedness   Complete by: As  directed    Call MD for:  persistant nausea and vomiting   Complete by: As directed    Diet - low sodium heart healthy   Complete by: As directed    Diet - low sodium heart healthy   Complete by: As directed    Diet Carb Modified   Complete by: As directed    Discharge instructions   Complete by: As directed    Please follow up with primary care in 7 days.   Discharge instructions   Complete by: As directed    Advised to follow-up with primary care physician in 1 week. Advised to follow-up with general surgery Dr. Arnoldo Morale in [place] as scheduled. Advised to take cefdinir twice a day for 10 days to complete 14-day course for oral antibiotics. Advised to take Diflucan 400 mg daily for 5 more days to complete 7-day treatment for Candida. Advised to continue Xarelto as directed.   Discharge wound care:   Complete by: As directed    Follow up General surgery Dr. Arnoldo Morale.   Increase activity slowly   Complete by: As directed    Increase activity slowly   Complete by: As directed    No wound care   Complete by: As directed      Allergies as of 05/17/2020      Reactions   Wheat Extract Swelling      Medication List    STOP taking these medications   methocarbamol 500 MG tablet Commonly known as: ROBAXIN   naproxen 500 MG tablet Commonly known as: NAPROSYN     TAKE these medications   acetaminophen 500 MG tablet Commonly known as: TYLENOL Take 1 tablet (500 mg total) by mouth every 6 (six)  hours as needed.   cefdinir 300 MG capsule Commonly known as: OMNICEF Take 1 capsule (300 mg total) by mouth every 12 (twelve) hours.   citalopram 20 MG tablet Commonly known as: CELEXA Take 1 tablet (20 mg total) by mouth daily.   donepezil 10 MG tablet Commonly known as: ARICEPT Take 5-10 mg by mouth in the morning and at bedtime.   fluconazole 200 MG tablet Commonly known as: DIFLUCAN Take 2 tablets (400 mg total) by mouth daily. Start taking on: May 18, 2020    gabapentin 300 MG capsule Commonly known as: NEURONTIN Take 300 mg by mouth 2 (two) times daily.   GINGER PO Take 1 tablet by mouth daily.   lisinopril-hydrochlorothiazide 10-12.5 MG tablet Commonly known as: ZESTORETIC Take 1 tablet by mouth daily.   LORazepam 1 MG tablet Commonly known as: ATIVAN Take 1 tablet (1 mg total) by mouth every 6 (six) hours as needed for anxiety.   oxyCODONE 5 MG immediate release tablet Commonly known as: Oxy IR/ROXICODONE Take 1 tablet (5 mg total) by mouth every 6 (six) hours as needed for moderate pain or severe pain.   pantoprazole 40 MG tablet Commonly known as: PROTONIX Take 1 tablet (40 mg total) by mouth 2 (two) times daily.   Rivaroxaban 15 MG Tabs tablet Commonly known as: XARELTO Take 1 tablet (15 mg total) by mouth 2 (two) times daily with a meal for 17 days. Complete on 05/23/20   rivaroxaban 20 MG Tabs tablet Commonly known as: XARELTO Take 1 tablet (20 mg total) by mouth daily with supper. Start 05/24/20 Start taking on: May 24, 2020   traZODone 100 MG tablet Commonly known as: DESYREL Take 200 mg by mouth at bedtime.            Discharge Care Instructions  (From admission, onward)         Start     Ordered   05/17/20 0000  Discharge wound care:       Comments: Follow up General surgery Dr. Arnoldo Morale.   05/17/20 1153          Contact information for follow-up providers    Schedule an appointment as soon as possible for a visit  with Rourk, Cristopher Estimable, MD.   Specialty: Gastroenterology Contact information: [address] [place] [address] [phone]        Aviva Signs, MD. Call.   Specialty: General Surgery Why: Call and schedule post-operative follow up appointment.  Contact information: [address] [address] [phone]            Contact information for after-discharge care    Destination    [facility] .   Service: Skilled Nursing Contact  information: [place] [phone]                 Allergies  Allergen Reactions  . Wheat Extract Swelling    Consultations:  General Surgery  Interventional radiology.   Procedures/Studies: DG Chest 1 View  Result Date: 05/02/2020 CLINICAL DATA:  At dyspnea EXAM: CHEST  1 VIEW COMPARISON:  Portable exam 1540 hours compared to 04/30/2020 FINDINGS: Chronic elevation of RIGHT diaphragm. Enlargement of cardiac silhouette. Mediastinal contours and pulmonary vascularity normal. Persistent RIGHT basilar atelectasis. No infiltrate, pleural effusion or pneumothorax. IMPRESSION: Persistent RIGHT basilar atelectasis. Electronically Signed   By: Lavonia Dana M.D.   On: 05/02/2020 15:55   CT Head Wo Contrast  Result Date: 04/20/2020 CLINICAL DATA:  Mental status change EXAM: CT HEAD WITHOUT CONTRAST TECHNIQUE: Contiguous axial images were obtained from the base of the skull through the vertex without intravenous contrast. COMPARISON:  CT brain 03/19/2020 FINDINGS: Brain: No acute territorial infarction, hemorrhage, or intracranial mass. The ventricles are nonenlarged. Vascular: No hyperdense vessels.  No unexpected calcification. Skull: Normal. Negative for fracture or focal lesion. Sinuses/Orbits: No acute finding. Other: None IMPRESSION: Negative non contrasted CT appearance of the brain. Electronically Signed   By: Donavan Foil M.D.   On: 04/20/2020 21:14   CT ANGIO CHEST PE W OR WO CONTRAST  Result Date: 05/01/2020 CLINICAL DATA:  Pulmonary embolus seen on recent abdominal CT. Recent repair of gastric ulcer. EXAM: CT ANGIOGRAPHY CHEST WITH CONTRAST TECHNIQUE: Multidetector CT imaging of the chest was performed using the standard protocol during bolus administration of intravenous contrast. Multiplanar CT image reconstructions and MIPs were obtained to evaluate the vascular anatomy. CONTRAST:  176mL OMNIPAQUE IOHEXOL 350 MG/ML SOLN COMPARISON:  CT  abdomen and pelvis including lower lung regions April 30, 2020 FINDINGS: Cardiovascular: Pulmonary emboli arise in each main pulmonary artery in a saddle type distribution with extension of pulmonary emboli into multiple lower lobe pulmonary arterial branches bilaterally. Right ventricle to left ventricle diameter ratio is measured at 1.1 consistent with a degree of right heart strain. There is no appreciable thoracic aortic aneurysm or dissection. Visualized great vessels appear normal. No pericardial effusion or pericardial thickening is evident. Mediastinum/Nodes: Thyroid appears unremarkable. No adenopathy is evident. There is a small hiatal hernia. Lungs/Pleura: There are fairly small pleural effusions bilaterally with consolidation in each lower lobe region, somewhat more on the right than on the left. Upper lung regions are clear. No pneumothorax. Upper Abdomen: There is a mild degree of ascites. There is mild pneumoperitoneum which was present 1 day prior. Incomplete visualization of upper pole renal cystic lesions again noted with complex lesion arising from the periphery of the upper pole the right kidney, better seen on prior CT of the abdomen. Cholelithiasis again noted. Postoperative changes noted in the upper right abdomen, incompletely visualized. Air in the midline upper abdominal wall region likely of recent postoperative etiology. Musculoskeletal: Degenerative change noted in the thoracic spine. No blastic or lytic bone lesions. No appreciable chest wall lesions. Review of the MIP images confirms the above findings. IMPRESSION: 1. Again noted multifocal pulmonary embolus with pulmonary emboli arising in a saddle type distribution from each main pulmonary artery with extension into multiple lower lobe pulmonary arteries bilaterally. Positive for acute PE with CTevidence of right heart strain (RV/LV Ratio = 1.1) consistent with at least submassive (intermediate risk) PE. The presence of right  heart strain has been associated with an increased risk of morbidity and mortality. 2. Airspace consolidation consistent with combination of pneumonia and compressive atelectasis in each lower lung region with fairly small pleural effusions bilaterally. 3.  No appreciable adenopathy. 4.  Small hiatal hernia again noted. 5. Pneumoperitoneum likely secondary to recent surgery. Mild ascites present. Postoperative changes in right upper quadrant again noted. Air in the upper abdominal wall is likely of recent postoperative etiology. 6.  Cholelithiasis. 7. Incomplete visualization of renal cystic lesions with complex mass arising from upper pole right kidney, better seen and characterized on recent abdominal CT examination. These results will be called to the ordering clinician or representative by the Radiologist Assistant, and communication documented in the PACS or [employer]. Electronically Signed   By: Lowella Grip III M.D.   On: 05/01/2020 11:22  CT ABDOMEN PELVIS W CONTRAST  Result Date: 05/13/2020 CLINICAL DATA:  Status post repair of perforated gastric ulcer complicated by intra-abdominal abscesses requiring drainage. Evaluate for recurrent right upper quadrant fluid collection. EXAM: CT ABDOMEN AND PELVIS WITH CONTRAST TECHNIQUE: Multidetector CT imaging of the abdomen and pelvis was performed using the standard protocol following bolus administration of intravenous contrast. CONTRAST:  161mL OMNIPAQUE IOHEXOL 300 MG/ML  SOLN COMPARISON:  CT abdomen pelvis dated May 06, 2020. FINDINGS: Lower chest: Unchanged trace bilateral pleural effusions and bibasilar atelectasis. Hepatobiliary: Small subcapsular fluid collection along the inferior right hepatic lobe has slightly decreased in size, currently measuring 2.3 x 1.7 cm, previously 3.4 x 2.1 cm. Multiple gallstones again noted. No biliary dilatation. Pancreas: Unchanged cystic lesions with associated calcifications within the pancreatic head  measuring up to 2.2 cm, better evaluated on recent MRI. No ductal dilatation or surrounding inflammatory changes. Spleen: Normal in size without focal abnormality. Adrenals/Urinary Tract: The adrenal glands are unremarkable. Bilateral parapelvic renal cysts again noted with new high density material within several cysts in the mid and lower pole of the left kidney (series 3, image 41; series 8, image 16). Unchanged 2.2 cm complex cyst in the upper pole of the right kidney is characterized on recent MRI. No renal calculi or hydronephrosis. The bladder is unremarkable. Stomach/Bowel: No oral contrast extravasation. Unchanged small hiatal hernia. The stomach is otherwise within normal limits. No bowel wall thickening, distention, or surrounding inflammatory changes. Sigmoid colonic diverticulosis. Normal appendix. Vascular/Lymphatic: No significant vascular findings are present. No enlarged abdominal or pelvic lymph nodes. Reproductive: Unchanged fibroid uterus.  No adnexal mass. Other: Reaccumulated subhepatic gas and fluid collection anterior to the gallbladder (series 3, image 31), currently measuring 1.7 x 2.3 cm, previously 1.8 x 3.5 cm. Redemonstrated small tract extending from this collection to the distal stomach (series 3, images 32 and 33). Small midline fluid collection along the upper abdominal incision is not significantly changed (series 3, image 37), currently measuring 1.9 x 0.9 cm, previously 2.0 x 0.9 cm. Suprapubic pelvic fluid collection has nearly resolved status post drainage. The pigtail drainage catheter remains in place. Small volume pneumoperitoneum has resolved. Musculoskeletal: No acute or significant osseous findings. IMPRESSION: 1. Reaccumulated subhepatic gas and fluid collection anterior to the gallbladder, currently measuring 1.7 x 2.3 cm, previously 1.8 x 3.5 cm. Redemonstrated small tract extending from this collection to the distal stomach, concerning for leak. No oral contrast  extravasation. 2. Small midline fluid collection along the upper abdominal incision is not significantly changed, currently measuring 1.9 x 0.9 cm, previously 2.0 x 0.9 cm. It is unclear if this communicates with the subhepatic abscess. 3. Suprapubic pelvic fluid collection has nearly resolved status post drainage. Decreasing subcapsular abscess along the inferior right liver. 4. New high density material within several left renal parapelvic cysts, suspicious for spontaneous hemorrhage and potentially related to anticoagulation. No hydronephrosis. Electronically Signed   By: Titus Dubin M.D.   On: 05/13/2020 13:12   CT ABDOMEN PELVIS W CONTRAST  Addendum Date: 05/06/2020   ADDENDUM REPORT: 05/06/2020 22:39 ADDENDUM: These results were called by telephone at the time of interpretation on 05/06/2020 at 10:38 pm to provider Dr. Cyd Silence, who verbally acknowledged these results. Electronically Signed   By: Iven Finn M.D.   On: 05/06/2020 22:39   Result Date: 05/06/2020 CLINICAL DATA:  Intra-abdominal abscess follow-up in a patient with perforated pre-pyloric gastric ulcer status post repair EXAM: CT ABDOMEN AND PELVIS WITH CONTRAST TECHNIQUE: Multidetector CT imaging  of the abdomen and pelvis was performed using the standard protocol following bolus administration of intravenous contrast. CONTRAST:  172mL OMNIPAQUE IOHEXOL 300 MG/ML  SOLN COMPARISON:  CT abdomen pelvis 04/30/2020, CT abdomen pelvis 04/20/2020, MR abdomen 04/22/2020 FINDINGS: Lower chest: Interval decrease in residual trace bilateral pleural effusions. Associated atelectasis of the bilateral, right greater than left lower lobes. Hepatobiliary: Interval decrease in size of a 3.4 x 2.1 x 1.8 cm (from 5.1 x 2.1 x 2cm) subcapsular fluid collection along the right inferior hepatic lobe (3:32). Subcentimeter hypodensity within the right hepatic dome is still too small to characterize. Cholelithiasis again noted. No biliary ductal dilatation.  Pancreas: Redemonstration several fluid density lesions with associated calcifications open (3:40) within the head of the pancreas measuring up to 2.2 cm (3:40). The lesions are better evaluated on MRI abdomen 04/22/2020. Otherwise no main pancreatic duct dilatation distally. No abnormal pancreatic contour. Spleen: Normal in size without focal abnormality. Adrenals/Urinary Tract: No adrenal nodule bilaterally. Redemonstration of bilateral peripelvic cysts. Bilateral subcentimeter hypodensities are too small to characterize. Similar-appearing 2.9 cm fluid density lesion that likely represents a simple renal cyst within the left kidney. Redemonstration of a 2.3 cm heterogeneous exophytic lesion arising from the right kidney (3:34) that likely represents a benign proteinaceous versus hemorrhagic cyst per MR abdomen 04/22/20. Bilateral kidneys enhance symmetrically. No hydronephrosis. No hydroureter. The urinary bladder is unremarkable. Stomach/Bowel: PO contrast is noted to reach the rectum. No PO contrast extravasation. Stomach is within normal limits. Appendix appears normal. No evidence of bowel wall thickening, distention, or inflammatory changes. Vascular/Lymphatic: No significant vascular findings are present. No enlarged abdominal or pelvic lymph nodes. Reproductive: Fibroid uterus.  No adnexal mass. Other: Trace free intraperitoneal fluid. Persistent free intraperitoneal gas within the upper abdomen with stable to slightly increased volume. Interval development of a new 3.4 x 1.8 x 1.9 cm gas and fluid collection just anterior to the gallbladder fundus (3:34) that appears to be continuous with the site of previous pre-pyloric gastric ulcer perforation (6:48-54). Interval placement of a left pelvic drain that terminates within a known irregular fluid and gas organized collection that has decreased in size with its largest pocket of fluid measuring 8 x 5.1 cm open (from 11 x 5 cm). Musculoskeletal: Suggestion of a  cutaneous tract between the new right upper abdomen gas and fluid collection and upper mid subcutaneous soft tissues of the anterior abdomen/incision (3:36, 7:71). Healing anterior abdominal incision with nonspecific soft tissue densities along the peritoneum (3:64, 73) likely related to recent surgery/closure. Mild subcutaneous soft tissue edema. No acute or significant osseous findings. Multilevel degenerative changes of the spine. IMPRESSION: 1. Interval development of a 3.4 x 1.8 x 1.9 cm right upper abdominal gas and fluid collection just anterior to the gallbladder fundus that appears to be continuous with the area of prior pre-pyloric gastric ulcer perforation (and therefore in keeping with a persistent enteric leak). This new subhepatic abscess also appears to be continuous with the upper abdominal subcutaneous soft tissues/incision consistent with a cutaneous fistula. 2. Interval decrease in size of a 3.4 x 2.1 x 1.8 cm subcapsular right hepatic fluid collection. 3. Interval placement of a left pelvic drain with interval decrease in size of a left pelvic gas and fluid collection with the largest pocket now measuring 8 x 5 cm (from 11 x 5 cm). 4. Interval decrease in bilateral trace pleural effusions with bibasilar atelectasis. 5. Other imaging findings of potential clinical significance: Cholelithiasis. Similar-appearing cystic pancreatic head and neck lesions  measuring up to 2.2 cm with associated calcifications that are better evaluated on MR abdomen 04/22/2020. Stable 2.3 cm heterogeneous right renal lesion that likely represents a benign proteinaceous versus hemorrhagic cyst per MRI abdomen 04/22/2020. Electronically Signed: By: Iven Finn M.D. On: 05/06/2020 22:23   CT ABDOMEN PELVIS W CONTRAST  Addendum Date: 05/01/2020   ADDENDUM REPORT: 05/01/2020 08:21 ADDENDUM: Upon further review, there are new thin non-occlusive saddle pulmonary emboli involving the left and right pulmonary arteries with  some extension into both lower lobes. Dedicated CTA of the chest is recommended for further evaluation. Critical Value/emergent results were called by telephone on 05/01/2020 at 8:20 am to provider [facility], who verbally acknowledged these results. Electronically Signed   By: Titus Dubin M.D.   On: 05/01/2020 08:21   Result Date: 05/01/2020 CLINICAL DATA:  History of perforated pre-pyloric gastric ulcer status post repair 8 days ago, now with worsening epigastric pain and vomiting. EXAM: CT ABDOMEN AND PELVIS WITH CONTRAST TECHNIQUE: Multidetector CT imaging of the abdomen and pelvis was performed using the standard protocol following bolus administration of intravenous contrast. CONTRAST:  111mL OMNIPAQUE IOHEXOL 300 MG/ML  SOLN COMPARISON:  MRI abdomen dated April 22, 2020. CT abdomen pelvis dated April 20, 2020. FINDINGS: Lower chest: Unchanged small right and trace left pleural effusions with adjacent lower lobe atelectasis. Hepatobiliary: Unchanged subcentimeter low-density lesion in the hepatic dome, too small to characterize. A small amount of perihepatic fluid with more focal 5.1 x 2.1 x 2.0 cm subcapsular fluid collection along the inferior right liver (series 2, image 31: Series 5, image 70) multiple gallstones again noted. No gallbladder wall thickening or biliary dilatation. Pancreas: Multiple cystic lesions in pancreatic head measuring up to 2.2 cm are unchanged. No ductal dilatation or surrounding inflammatory changes. Spleen: Normal in size without focal abnormality. Adrenals/Urinary Tract: Adrenal glands are unremarkable. Unchanged bilateral renal cysts, including a 1.8 cm complex cyst in the upper pole of the right kidney as characterized on recent MRI. No renal calculi or hydronephrosis. Bladder is unremarkable. Stomach/Bowel: Unchanged small hiatal hernia. Mildly pre-pyloric gastric wall thickening is likely postsurgical. There are few mildly dilated loops of jejunum in the mid and lower  abdomen with gradual transition to ileum. Oral contrast reaches the terminal ileum. No bowel wall thickening or surrounding inflammatory changes. Left-sided colonic diverticulosis. Normal appendix. Vascular/Lymphatic: No significant vascular findings. No enlarged abdominal or pelvic lymph nodes. Reproductive: Unchanged fibroid uterus.  No adnexal mass. Other: New 5.3 x 14.0 x 3.9 cm (AP by transverse by CC) rim enhancing fluid collection in the pelvis anterior to the uterine fundus. There are few residual foci of pneumoperitoneum in the right upper quadrant and deep to the midline abdominal incision. Musculoskeletal: No acute or significant osseous findings. IMPRESSION: 1. New 5.3 x 14.0 x 3.9 cm abscess in the pelvis. 2. Small amount of perihepatic fluid with more focal 5.1 x 2.1 x 2.0 cm subcapsular fluid collection along the inferior right liver, also concerning for abscess. 3. Trace residual pneumoperitoneum in the right upper quadrant and deep to the midline abdominal incision, likely postsurgical. 4. Mildly dilated loops of small bowel in the mid and lower abdomen are favored to represent ileus as oral contrast reaches the terminal ileum. 5. Unchanged cystic lesions in the pancreatic head measuring up to 2.2 cm, better evaluated on recent MRI. Please see that report for follow-up recommendations. 6. Unchanged cholelithiasis. 7. Unchanged small right and trace left pleural effusions. Electronically Signed: By: Orville Govern.D.  On: 04/30/2020 19:04   CT ABDOMEN PELVIS W CONTRAST  Result Date: 04/20/2020 CLINICAL DATA:  Acute abdominal pain. Elevated bilirubin. Altered mental status. EXAM: CT ABDOMEN AND PELVIS WITH CONTRAST TECHNIQUE: Multidetector CT imaging of the abdomen and pelvis was performed using the standard protocol following bolus administration of intravenous contrast. CONTRAST:  81mL OMNIPAQUE IOHEXOL 300 MG/ML  SOLN COMPARISON:  No prior abdominal imaging. FINDINGS: Lower chest: Mild  hypoventilatory changes in the lung bases. No pleural fluid or confluent airspace disease. Hepatobiliary: 7 mm hypodensity in the right dome of the liver, series 2, image 12, too small to accurately characterize. Multiple cholesterol gallstones in the gallbladder. No pericholecystic inflammation or evidence of wall thickening. No common bile duct dilatation. Pancreas: Coarse calcification in the pancreatic head. There are at least 3 cystic lesions in the pancreatic head/uncinate process, largest measuring 2.2 cm, series 2, image 29. There is an adjacent 1.3 cm cystic structure and a 1 cm cyst more inferiorly. No definite peripancreatic fat stranding or inflammation. There is no pancreatic ductal dilatation. Spleen: Normal in size without focal abnormality. Adrenals/Urinary Tract: No adrenal nodule. There is a punctate calcification involving the medial limb of the left adrenal gland. Indeterminate lesion arising from the upper right kidney measures 17 x 18 mm has internal heterogeneity and peripheral calcifications. This is adjacent to a small cortical cyst. There are scattered additional cysts within the right kidney as well as parapelvic cysts. Cortical as well as parapelvic cysts in the left kidney. No hydronephrosis. No perinephric edema. Urinary bladder is unremarkable. Stomach/Bowel: Small hiatal hernia with mild distal esophageal wall thickening. Stomach is unremarkable. Normal positioning of the duodenum and ligament of Treitz. There is no small bowel obstruction or inflammatory change. Cecum is high-riding in the right mid abdomen. Normal air-filled appendix. Transverse colonic tortuosity. Colonic diverticulosis involving the distal descending and sigmoid colon. No acute diverticulitis. Vascular/Lymphatic: Aortic atherosclerosis with mild tortuosity. No aortic aneurysm. The portal vein is patent. There is bi-iliac tortuosity. No bulky enlarged abdominopelvic lymph nodes. Reproductive: Globular enlarged  uterus with multiple presumed fibroids, the largest appears projecting posteriorly and measures 5.6 cm. Right ovary tentatively visualized and normal. The left ovary is not definitively seen. Other: No free air or free fluid. Musculoskeletal: Multilevel degenerative change in the spine. Vacuum phenomena at L4-L5 and L5-S1. Prominent facet hypertrophy. Modic endplate changes at O17-P10. There are no acute or suspicious osseous abnormalities. IMPRESSION: 1. Cholelithiasis without gallbladder inflammation. No biliary dilatation. 2. Coarse calcification in the pancreatic head consistent with chronic pancreatitis. There are at least 3 cystic lesions in the pancreatic head/uncinate process, largest measuring 2.2 cm. These may represent sequela of prior pancreatitis and pseudocysts, however recommend further evaluation with pancreatic protocol MRI for further characterization. This should ideally be performed on an elective basis after resolution of acute event when patient is able to tolerate breath hold technique. 3. Indeterminate 17 x 18 mm lesion arising from the upper right kidney has internal heterogeneity and peripheral calcifications. This likely represents a Bosniak 59F renal cyst, but is not definitively characterized on the current exam. Recommend MRI characterization of this lesion. There are additional simple cysts as well as parapelvic cysts in both kidneys. 4. Globular enlarged uterus with multiple presumed fibroids, the largest measuring 5.6 cm. 5. Small hiatal hernia with mild distal esophageal wall thickening, can be seen with reflux or esophagitis. 6. Colonic diverticulosis without acute inflammation. Aortic Atherosclerosis (ICD10-I70.0). Electronically Signed   By: Keith Rake M.D.   On: 04/20/2020  23:14   IR Catheter Tube Change  Result Date: 05/09/2020 INDICATION: 64 year old female with a history Graham patch for ruptured hollow viscus EXAM: IMAGE GUIDED REPLACEMENT/UPSIZE OF PERCUTANEOUS  DRAIN MEDICATIONS: The patient is currently admitted to the hospital and receiving intravenous antibiotics. The antibiotics were administered within an appropriate time frame prior to the initiation of the procedure. ANESTHESIA/SEDATION: None COMPLICATIONS: None PROCEDURE: Informed written consent was obtained from the patient after a thorough discussion of the procedural risks, benefits and alternatives. All questions were addressed. Maximal Sterile Barrier Technique was utilized including caps, mask, sterile gowns, sterile gloves, sterile drape, hand hygiene and skin antiseptic. A timeout was performed prior to the initiation of the procedure. Patient is position supine position on the interventional II table. The drain was prepped and draped in the usual sterile fashion. 1% lidocaine was used for local anesthesia. Contrast was injected confirming location, and also identifying that the drain was partially blocked with significant resistance upon injection. Bentson wire was passed through the drain and modified Seldinger technique was used to upsize the drain from 12 [place] to a 14 [place] drain. Aspiration of significant volume of old blood products was performed. Drain was attached to gravity drainage.  Suture was placed. Patient had some discomfort during the procedure the remained hemodynamically stable. No complications. IMPRESSION: Routine exchange and up size of pelvic drain with 2 a 14 French drain and evacuation of significant volume of old blood products. Signed, Dulcy Fanny. Dellia Nims, [facility] Radiology PLAN: Future drain changes should consider moderate sedation, given the patient's discomfort Electronically Signed   By: Corrie Mckusick D.O.   On: 05/09/2020 09:03   CT ASPIRATION  Result Date: 05/08/2020 INDICATION: 64 year old female with a history of Graham patch for perforated gastric ulcer, with new fluid collection in the sub a Paddock space  referred for aspiration EXAM: CT-GUIDED ASPIRATE OF UPPER ABDOMINAL FLUID COLLECTION MEDICATIONS: The patient is currently admitted to the hospital and receiving intravenous antibiotics. The antibiotics were administered within an appropriate time frame prior to the initiation of the procedure. ANESTHESIA/SEDATION: Fentanyl 1.0 mcg IV; Versed 50 mg IV Moderate Sedation Time:  10 minutes The patient was continuously monitored during the procedure by the interventional radiology nurse under my direct supervision. COMPLICATIONS: None PROCEDURE: Informed written consent was obtained from the patient after a thorough discussion of the procedural risks, benefits and alternatives. All questions were addressed. Maximal Sterile Barrier Technique was utilized including caps, mask, sterile gowns, sterile gloves, sterile drape, hand hygiene and skin antiseptic. A timeout was performed prior to the initiation of the procedure. Patient was positioned supine position on CT gantry table. Scout CT was acquired for planning purposes. Patient is prepped and draped in the usual sterile fashion. 1% lidocaine was used for local anesthesia. Using CT guidance, Yueh needle was advanced into the fluid collection of the subhepatic space. Aspirate of the gas and fluid collection was accomplished with approximately 3-4 cc of bloody fluid with some complexity. Culture was sent. Yueh catheter was removed and a sterile bandage was placed. Patient tolerated the procedure well and remained hemodynamically stable throughout. No complications were encountered and no significant blood loss. IMPRESSION: Status post CT-guided aspirate of subhepatic fluid collection, with a culture sent with some complex bloody fluid. Signed, Dulcy Fanny. Dellia Nims, R[facility] Radiology Electronically Signed   By: Corrie Mckusick D.O.   On: 05/08/2020 07:08   MR 3D Recon At Scanner  Result Date: 04/22/2020 CLINICAL  DATA:  Pancreatitis suspected, characterize pancreatic lesions, characterize incidental right renal lesion EXAM: MRI ABDOMEN WITHOUT AND WITH CONTRAST (INCLUDING MRCP) TECHNIQUE: Multiplanar multisequence MR imaging of the abdomen was performed both before and after the administration of intravenous contrast. Heavily T2-weighted images of the biliary and pancreatic ducts were obtained, and three-dimensional MRCP images were rendered by post processing. CONTRAST:  60mL GADAVIST GADOBUTROL 1 MMOL/ML IV SOLN COMPARISON:  CT abdomen pelvis, 04/20/2020 FINDINGS: Lower chest: Small right pleural effusion associated atelectasis or consolidation. Trace left pleural effusion. Hepatobiliary: No mass or other parenchymal abnormality identified. Gallstones in the gallbladder. No gallbladder wall thickening. No biliary ductal dilatation. Pancreas: There is a multicystic fluid signal lesion in the anterior pancreatic head measuring 4.1 x 2.6 x 3.0 cm (series 5, image 26, series 4, image 12). There are multiple associated small calcifications. This is clearly distinct from the pancreatic duct and there is no pancreatic ductal dilatation. No solid contrast enhancing component. Spleen:  Within normal limits in size and appearance. Adrenals/Urinary Tract: No masses identified. Numerous bilateral renal cysts, predominantly parapelvic although also with several cortical cysts. An exophytic cyst of the superior pole of the right kidney is partially calcified and demonstrates mixed intrinsic signal, but demonstrates no contrast enhancement. Remaining cysts are simple and fluid signal without enhancement. No evidence of hydronephrosis. Stomach/Bowel: There is gastric mucosal thickening of the antrum and pylorus and a possible small ulceration of the anterior gastric antrum (series 5, image 22). Vascular/Lymphatic: No pathologically enlarged lymph nodes identified. No abdominal aortic aneurysm demonstrated. Other:  There is new moderate  volume ascites and pneumoperitoneum. Musculoskeletal: No suspicious bone lesions identified. IMPRESSION: 1. There is new moderate volume ascites throughout the abdomen and pneumoperitoneum, concerning for perforated viscus organ. There is gastric mucosal thickening of the antrum and pylorus and a possible small ulceration of the anterior pylorus, suggesting a perforated gastric ulceration although nidus of perforation is not definitively localized. 2. There is a multicystic fluid signal lesion in the anterior pancreatic head measuring 4.1 cm. This is clearly distinct from the pancreatic duct and there are multiple associated small calcifications. Favor pancreatic pseudocyst although mucinous cystic neoplasm is a substantial differential consideration given appearance. Given lesion size recommend consideration of EUS and FNA for diagnosis. 3. No pancreatic ductal dilatation. 4. Numerous bilateral renal cysts, predominantly parapelvic although also with several cortical cysts. An exophytic cyst of the superior pole of the right kidney is partially calcified and demonstrates mixed intrinsic signal, but demonstrates no contrast enhancement. This is consistent with a benign proteinaceous or hemorrhagic cyst. 5. Cholelithiasis. No evidence of acute cholecystitis. No biliary ductal dilatation. These results were called by telephone at the time of interpretation on 04/22/2020 at 10:17 am to Dr. Manuella Ghazi, who verbally acknowledged these results. Electronically Signed   By: Eddie Candle M.D.   On: 04/22/2020 10:31   US Venous Img Lower Bilateral (DVT)  Result Date: 05/01/2020 CLINICAL DATA:  Recent pulmonary embolus EXAM: BILATERAL LOWER EXTREMITY VENOUS DUPLEX ULTRASOUND TECHNIQUE: Gray-scale sonography with graded compression, as well as color Doppler and duplex ultrasound were performed to evaluate the lower extremity deep venous systems from the level of the common femoral vein and including the common femoral, femoral,  profunda femoral, popliteal and calf veins including the posterior tibial, peroneal and gastrocnemius veins when visible. The superficial great saphenous vein was also interrogated. Spectral Doppler was utilized to evaluate flow at rest and with distal augmentation maneuvers in the common femoral, femoral  and popliteal veins. COMPARISON:  CT abdomen and pelvis including portions of lower chest April 30, 2020 FINDINGS: RIGHT LOWER EXTREMITY Common Femoral Vein: No evidence of thrombus. Normal compressibility, respiratory phasicity and response to augmentation. Saphenofemoral Junction: No evidence of thrombus. Normal compressibility and flow on color Doppler imaging. Profunda Femoral Vein: No evidence of thrombus. Normal compressibility and flow on color Doppler imaging. Femoral Vein: No evidence of thrombus. Normal compressibility, respiratory phasicity and response to augmentation. Popliteal Vein: No evidence of thrombus. Normal compressibility, respiratory phasicity and response to augmentation. Calf Veins: No evidence of thrombus. Normal compressibility and flow on color Doppler imaging. Superficial Great Saphenous Vein: No evidence of thrombus. Normal compressibility. Venous Reflux:  None. Other Findings:  None. LEFT LOWER EXTREMITY Common Femoral Vein: No evidence of thrombus. Normal compressibility, respiratory phasicity and response to augmentation. Saphenofemoral Junction: No evidence of thrombus. Normal compressibility and flow on color Doppler imaging. Profunda Femoral Vein: No evidence of thrombus. Normal compressibility and flow on color Doppler imaging. Femoral Vein: No evidence of thrombus. Normal compressibility, respiratory phasicity and response to augmentation. Popliteal Vein: No evidence of thrombus. Normal compressibility, respiratory phasicity and response to augmentation. Calf Veins: No evidence of thrombus. Normal compressibility and flow on color Doppler imaging. Superficial Great Saphenous  Vein: No evidence of thrombus. Normal compressibility. Venous Reflux:  None. Other Findings:  None. IMPRESSION: No evidence of deep venous thrombosis in either lower extremity. Electronically Signed   By: Lowella Grip III M.D.   On: 05/01/2020 10:17   DG Chest Port 1 View  Result Date: 04/30/2020 CLINICAL DATA:  Wheezing and coughing. EXAM: PORTABLE CHEST 1 VIEW COMPARISON:  One-view chest x-ray 04/22/2020 FINDINGS: Enteric tube was removed. Heart is enlarged. Pulmonary vascular congestion is again noted. Aeration and lung volumes are proved bilaterally. Chronic elevation of the right hemidiaphragm is noted. Mild degenerative changes are noted in the cervical spine and shoulders. IMPRESSION: 1. Stable cardiomegaly and mild pulmonary vascular congestion. 2. Improved aeration and lung volumes bilaterally. Electronically Signed   By: San Morelle M.D.   On: 04/30/2020 05:12   DG Chest Port 1 View  Result Date: 04/22/2020 CLINICAL DATA:  Peri op EXAM: PORTABLE CHEST 1 VIEW COMPARISON:  04/22/2020, CT 04/20/2020, MRI 04/22/2020 FINDINGS: Esophageal tube tip overlies the gastric outlet. New cutaneous staples over the right hemiabdomen. Low lung volumes. Borderline cardiomegaly. Patchy atelectasis at the bases. Resolution of previously noted free air beneath the right diaphragm. No pneumothorax. IMPRESSION: 1. Esophageal tube tip overlies the gastric outlet. 2. Low lung volumes with patchy basilar atelectasis. Resolution of previously noted free air beneath the right diaphragm Electronically Signed   By: Donavan Foil M.D.   On: 04/22/2020 20:08   DG CHEST PORT 1 VIEW  Result Date: 04/22/2020 CLINICAL DATA:  Pneumoperitoneum. EXAM: PORTABLE CHEST 1 VIEW COMPARISON:  04/22/2020 MR and prior studies FINDINGS: Pneumoperitoneum underlying the RIGHT hemidiaphragm is noted, identified on earlier MRI. An NG tube is present with tip overlying the distal stomach. Bilateral LOWER lung opacities/atelectasis  are present. There is no evidence of pneumothorax. IMPRESSION: 1. Pneumoperitoneum underlying the RIGHT hemidiaphragm. 2. Bilateral LOWER lung opacities/atelectasis. Electronically Signed   By: Margarette Canada M.D.   On: 04/22/2020 12:43   DG Chest Port 1 View  Result Date: 04/20/2020 CLINICAL DATA:  Altered mental status. EXAM: PORTABLE CHEST 1 VIEW COMPARISON:  September 09, 2014 FINDINGS: Mild, diffuse chronic appearing increased lung markings are seen without evidence of acute infiltrate, pleural effusion or pneumothorax. The cardiac  silhouette is mildly enlarged. The visualized skeletal structures are unremarkable. IMPRESSION: Chronic appearing increased lung markings without evidence of acute or active cardiopulmonary disease. Electronically Signed   By: Virgina Norfolk M.D.   On: 04/20/2020 21:05   DG UGI W SINGLE CM (SOL OR THIN BA)  Result Date: 04/25/2020 CLINICAL DATA:  Perforated prepyloric ulcer due to naproxen, post repair EXAM: WATER SOLUBLE UPPER GI SERIES TECHNIQUE: Single-column upper GI series was performed using water soluble contrast. CONTRAST:  153mL ISOVUE-300 IOPAMIDOL (ISOVUE-300) INJECTION 61% orally COMPARISON:  CT abdomen and pelvis 04/20/2020 FLUOROSCOPY TIME:  Fluoroscopy Time:  1 minutes 18 seconds Radiation Exposure Index (if provided by the fluoroscopic device): 49.5 mGy Number of Acquired Spot Images: 8 + multiple fluoroscopic screen captures FINDINGS: Scout image demonstrates tip of nasogastric tube projecting over stomach. JP drain projects over duodenal bulb, pylorus, and prepyloric region. Skin clips from upper abdominal laparotomy. Gaseous distention of stomach. Contrast opacifies the gastric lumen and with patient turned to RIGHT lateral decubitus position, empties from the stomach into duodenum. No evidence of gastric outlet obstruction. Mild bowel wall thickening of the pylorus, duodenal bulb and pre pyloric mucosa. No extraluminal contrast extravasation identified. No  contrast is seen along the JP drain. IMPRESSION: Edema at the surgical region at the pylorus, pre pyloric, and duodenal bulb regions. No evidence of contrast extravasation. Electronically Signed   By: Lavonia Dana M.D.   On: 04/25/2020 10:11   MR ABDOMEN MRCP W WO CONTAST  Result Date: 04/22/2020 CLINICAL DATA:  Pancreatitis suspected, characterize pancreatic lesions, characterize incidental right renal lesion EXAM: MRI ABDOMEN WITHOUT AND WITH CONTRAST (INCLUDING MRCP) TECHNIQUE: Multiplanar multisequence MR imaging of the abdomen was performed both before and after the administration of intravenous contrast. Heavily T2-weighted images of the biliary and pancreatic ducts were obtained, and three-dimensional MRCP images were rendered by post processing. CONTRAST:  71mL GADAVIST GADOBUTROL 1 MMOL/ML IV SOLN COMPARISON:  CT abdomen pelvis, 04/20/2020 FINDINGS: Lower chest: Small right pleural effusion associated atelectasis or consolidation. Trace left pleural effusion. Hepatobiliary: No mass or other parenchymal abnormality identified. Gallstones in the gallbladder. No gallbladder wall thickening. No biliary ductal dilatation. Pancreas: There is a multicystic fluid signal lesion in the anterior pancreatic head measuring 4.1 x 2.6 x 3.0 cm (series 5, image 26, series 4, image 12). There are multiple associated small calcifications. This is clearly distinct from the pancreatic duct and there is no pancreatic ductal dilatation. No solid contrast enhancing component. Spleen:  Within normal limits in size and appearance. Adrenals/Urinary Tract: No masses identified. Numerous bilateral renal cysts, predominantly parapelvic although also with several cortical cysts. An exophytic cyst of the superior pole of the right kidney is partially calcified and demonstrates mixed intrinsic signal, but demonstrates no contrast enhancement. Remaining cysts are simple and fluid signal without enhancement. No evidence of hydronephrosis.  Stomach/Bowel: There is gastric mucosal thickening of the antrum and pylorus and a possible small ulceration of the anterior gastric antrum (series 5, image 22). Vascular/Lymphatic: No pathologically enlarged lymph nodes identified. No abdominal aortic aneurysm demonstrated. Other:  There is new moderate volume ascites and pneumoperitoneum. Musculoskeletal: No suspicious bone lesions identified. IMPRESSION: 1. There is new moderate volume ascites throughout the abdomen and pneumoperitoneum, concerning for perforated viscus organ. There is gastric mucosal thickening of the antrum and pylorus and a possible small ulceration of the anterior pylorus, suggesting a perforated gastric ulceration although nidus of perforation is not definitively localized. 2. There is a multicystic fluid signal lesion in  the anterior pancreatic head measuring 4.1 cm. This is clearly distinct from the pancreatic duct and there are multiple associated small calcifications. Favor pancreatic pseudocyst although mucinous cystic neoplasm is a substantial differential consideration given appearance. Given lesion size recommend consideration of EUS and FNA for diagnosis. 3. No pancreatic ductal dilatation. 4. Numerous bilateral renal cysts, predominantly parapelvic although also with several cortical cysts. An exophytic cyst of the superior pole of the right kidney is partially calcified and demonstrates mixed intrinsic signal, but demonstrates no contrast enhancement. This is consistent with a benign proteinaceous or hemorrhagic cyst. 5. Cholelithiasis. No evidence of acute cholecystitis. No biliary ductal dilatation. These results were called by telephone at the time of interpretation on 04/22/2020 at 10:17 am to Dr. Manuella Ghazi, who verbally acknowledged these results. Electronically Signed   By: Eddie Candle M.D.   On: 04/22/2020 10:31   ECHOCARDIOGRAM COMPLETE  Result Date: 05/02/2020    ECHOCARDIOGRAM REPORT   Patient Name:   GHINA BITTINGER Date  of Exam: 05/02/2020 Medical Rec #:  [id number]      Height:       64.0 in Accession #:    [id number]     Weight:       224.9 lb Date of Birth:  05-Sep-1955      BSA:          2.056 m Patient Age:    67 years       BP:           143/79 mmHg Patient Gender: F              HR:           71 bpm. Exam Location:  Inpatient Procedure: 2D Echo, Cardiac Doppler and Color Doppler Indications:    Pulmonary Embolus  History:        Patient has no prior history of Echocardiogram examinations.                 Risk Factors:Hypertension and Obesity. Pulmonary embolus, s/p GI                 surgery.  Sonographer:    Dustin Flock Referring Phys: [id number] [place]  1. Left ventricular ejection fraction, by estimation, is 60 to 65%. The left ventricle has normal function. The left ventricle has no regional wall motion abnormalities. Left ventricular diastolic parameters were normal. There is the interventricular septum is flattened in systole, consistent with right ventricular pressure overload.  2. Right ventricular systolic function is normal. The right ventricular size is mildly enlarged. There is moderately elevated pulmonary artery systolic pressure. The estimated right ventricular systolic pressure is 30.8 mmHg.  3. The mitral valve is normal in structure. Trivial mitral valve regurgitation. No evidence of mitral stenosis.  4. The aortic valve is tricuspid. Aortic valve regurgitation is not visualized. No aortic stenosis is present. Comparison(s): No prior Echocardiogram. FINDINGS  Left Ventricle: Left ventricular ejection fraction, by estimation, is 60 to 65%. The left ventricle has normal function. The left ventricle has no regional wall motion abnormalities. The left ventricular internal cavity size was normal in size. There is  no left ventricular hypertrophy. The interventricular septum is flattened in systole, consistent with right ventricular pressure overload. Left ventricular diastolic parameters were  normal. Right Ventricle: The right ventricular size is mildly enlarged. No increase in right ventricular wall thickness. Right ventricular systolic function is normal. There is moderately elevated pulmonary artery systolic pressure. The tricuspid regurgitant velocity is  3.28 m/s, and with an assumed right atrial pressure of 8 mmHg, the estimated right ventricular systolic pressure is 57.8 mmHg. Left Atrium: Left atrial size was normal in size. Right Atrium: Right atrial size was normal in size. Pericardium: There is no evidence of pericardial effusion. Mitral Valve: The mitral valve is normal in structure. Trivial mitral valve regurgitation. No evidence of mitral valve stenosis. Tricuspid Valve: The tricuspid valve is normal in structure. Tricuspid valve regurgitation is trivial. No evidence of tricuspid stenosis. Aortic Valve: The aortic valve is tricuspid. Aortic valve regurgitation is not visualized. No aortic stenosis is present. Pulmonic Valve: The pulmonic valve was not well visualized. Pulmonic valve regurgitation is not visualized. Aorta: The aortic root and ascending aorta are structurally normal, with no evidence of dilitation. Venous: The inferior vena cava was not well visualized. IAS/Shunts: The atrial septum is grossly normal.  LEFT VENTRICLE PLAX 2D LVIDd:         5.10 cm  Diastology LVIDs:         3.10 cm  LV e' medial:    12.70 cm/s LV PW:         1.00 cm  LV E/e' medial:  6.7 LV IVS:        1.00 cm  LV e' lateral:   10.90 cm/s LVOT diam:     2.40 cm  LV E/e' lateral: 7.8 LV SV:         118 LV SV Index:   57 LVOT Area:     4.52 cm  RIGHT VENTRICLE RV Basal diam:  3.20 cm RV S prime:     21.30 cm/s TAPSE (M-mode): 3.8 cm LEFT ATRIUM             Index       RIGHT ATRIUM           Index LA diam:        3.70 cm 1.80 cm/m  RA Area:     15.60 cm LA Vol (A2C):   51.8 ml 25.19 ml/m RA Volume:   38.90 ml  18.92 ml/m LA Vol (A4C):   38.3 ml 18.63 ml/m LA Biplane Vol: 49.0 ml 23.83 ml/m  AORTIC VALVE  LVOT Vmax:   126.00 cm/s LVOT Vmean:  84.700 cm/s LVOT VTI:    0.261 m  AORTA Ao Root diam: 2.90 cm MITRAL VALVE               TRICUSPID VALVE MV Area (PHT): 5.66 cm    TR Peak grad:   43.0 mmHg MV Decel Time: 134 msec    TR Vmax:        328.00 cm/s MV E velocity: 85.30 cm/s MV A velocity: 93.00 cm/s  SHUNTS MV E/A ratio:  0.92        Systemic VTI:  0.26 m                            Systemic Diam: 2.40 cm Buford Dresser MD Electronically signed by Buford Dresser MD Signature Date/Time: 05/02/2020/6:06:31 PM    Final    CT IMAGE GUIDED FLUID DRAIN BY CATHETER  Result Date: 05/02/2020 INDICATION: 64 year old female approximately 8 days status post open surgical repair of a perforated pre-pyloric gastric ulcer. Recent CT imaging demonstrated new accumulation of a fluid collection in the dependent anatomic pelvis anterior to the uterus and superior to the bladder. She presents for CT-guided drain placement. EXAM: CT-guided drain placement MEDICATIONS: The patient  is currently admitted to the hospital and receiving intravenous antibiotics. The antibiotics were administered within an appropriate time frame prior to the initiation of the procedure. ANESTHESIA/SEDATION: Fentanyl 100 mcg IV; Versed 2.5 mg IV Moderate Sedation Time:  27 minutes The patient was continuously monitored during the procedure by the interventional radiology nurse under my direct supervision. COMPLICATIONS: None immediate. PROCEDURE: Informed written consent was obtained from the patient after a thorough discussion of the procedural risks, benefits and alternatives. All questions were addressed. Maximal Sterile Barrier Technique was utilized including caps, mask, sterile gowns, sterile gloves, sterile drape, hand hygiene and skin antiseptic. A timeout was performed prior to the initiation of the procedure. A planning axial CT scan was performed. The fluid collection was successfully identified. There is a very small window  approaching from inferior to the sigmoid colon. A skin entry site was selected and marked. The overlying skin was sterilely prepped and draped in the standard fashion using chlorhexidine skin prep. Local anesthesia was attained by infiltration with 1% lidocaine. A small dermatotomy was made. Under intermittent CT guidance, an 18 gauge trocar needle was carefully advanced under the sigmoid colon and into the fluid collection. A 0.035 wire was coiled in the fluid collection. The skin tract was dilated to 12 [place]. A Cook 12 [place] all-purpose drainage catheter was then advanced over the wire and formed. Aspiration yields approximately 30 mL of purulent appearing fluid. The fluid was sent for Gram stain and culture. The drainage catheter was gently flushed and secured to the skin with 0 Prolene suture and an adhesive fixation device. The patient tolerated the procedure well. IMPRESSION: Successful placement of a 12 French drainage catheter into the dependent low abdominal fluid collection. Aspiration yields 30 mL of purulent appearing fluid which was sent for Gram stain and culture. PLAN: 1. Maintain drain to JP bulb suction. 2. Flush drainage catheter with 10 mL saline at least once per shift. 3. When drain output is minimal (serous and less than 15 mL per day for at least 48 hours) in the patient is clinically improved, the drainage catheter can be removed. Signed, Criselda Peaches, MD, [facility] Radiology Electronically Signed   By: Jacqulynn Cadet M.D.   On: 05/02/2020 09:36       Subjective: Patient was seen and examined at bedside.  Overnight events noted.  Patient reports feeling much better,  abdominal pain has resolved.  Denies nausea, vomiting and diarrhea.  Patient is being discharged to skilled nursing facility.  Discharge Exam: Vitals:   05/17/20 0746 05/17/20 1100  BP: 137/89 127/85  Pulse: 93 60  Resp: 19 20  Temp: 98.8 F (37.1  C) 98.3 F (36.8 C)  SpO2: 95% 90%   Vitals:   05/16/20 2319 05/17/20 0354 05/17/20 0746 05/17/20 1100  BP: (!) 153/87 118/79 137/89 127/85  Pulse: 96 89 93 60  Resp: 18 19 19 20   Temp: 98.7 F (37.1 C) 98.4 F (36.9 C) 98.8 F (37.1 C) 98.3 F (36.8 C)  TempSrc: Oral Oral Oral Oral  SpO2: 98% 96% 95% 90%  Weight:      Height:        General: Pt is alert, awake, not in acute distress Cardiovascular: RRR, S1/S2 +, no rubs, no gallops Respiratory: CTA bilaterally, no wheezing, no rhonchi Abdominal: Soft, NT, ND, bowel sounds + Dressing noted. Extremities: no edema, no cyanosis    The results of significant diagnostics from this hospitalization (including imaging, microbiology, ancillary  and laboratory) are listed below for reference.     Microbiology: Recent Results (from the past 240 hour(s))  Aerobic/Anaerobic Culture (surgical/deep wound)     Status: None   Collection Time: 05/07/20  5:22 PM   Specimen: Abdomen; Abscess  Result Value Ref Range Status   Specimen Description ABDOMEN ABSCESS  Final   Special Requests   Final    SUB HEPATIC FLUID NEAR PRIOR GASTRIC ULCER PERFORATION   Gram Stain   Final    ABUNDANT WBC PRESENT, PREDOMINANTLY PMN NO ORGANISMS SEEN    Culture   Final    RARE ESCHERICHIA COLI FEW CANDIDA TROPICALIS NO ANAEROBES ISOLATED Performed at [facility], [place] [address] [place], [place] [address]    Report Status 05/12/2020 FINAL  Final   Organism ID, Bacteria ESCHERICHIA COLI  Final      Susceptibility   Escherichia coli - MIC*    AMPICILLIN >=32 RESISTANT Resistant     CEFAZOLIN 16 SENSITIVE Sensitive     CEFEPIME <=0.12 SENSITIVE Sensitive     CEFTAZIDIME <=1 SENSITIVE Sensitive     CEFTRIAXONE <=0.25 SENSITIVE Sensitive     CIPROFLOXACIN <=0.25 SENSITIVE Sensitive     GENTAMICIN <=1 SENSITIVE Sensitive     IMIPENEM <=0.25 SENSITIVE Sensitive     TRIMETH/SULFA <=20 SENSITIVE Sensitive     AMPICILLIN/SULBACTAM >=32 RESISTANT  Resistant     PIP/TAZO <=4 SENSITIVE Sensitive     * RARE ESCHERICHIA COLI  Surgical PCR screen     Status: None   Collection Time: 05/08/20 10:33 PM   Specimen: Nasal Mucosa; Nasal Swab  Result Value Ref Range Status   MRSA, PCR NEGATIVE NEGATIVE Final   Staphylococcus aureus NEGATIVE NEGATIVE Final    Comment: (NOTE) The Xpert SA Assay (FDA approved for NASAL specimens in patients 57 years of age and older), is one component of a comprehensive surveillance program. It is not intended to diagnose infection nor to guide or monitor treatment. Performed at [facility], [place] [address], [place], [place] [address]   SARS CORONAVIRUS 2 (TAT 6-24 HRS) Nasopharyngeal Nasopharyngeal Swab     Status: None   Collection Time: 05/15/20  5:50 PM   Specimen: Nasopharyngeal Swab  Result Value Ref Range Status   SARS Coronavirus 2 NEGATIVE NEGATIVE Final    Comment: (NOTE) SARS-CoV-2 target nucleic acids are NOT DETECTED.  The SARS-CoV-2 RNA is generally detectable in upper and lower respiratory specimens during the acute phase of infection. Negative results do not preclude SARS-CoV-2 infection, do not rule out co-infections with other pathogens, and should not be used as the sole basis for treatment or other patient management decisions. Negative results must be combined with clinical observations, patient history, and epidemiological information. The expected result is Negative.  Fact Sheet for Patients: SugarRoll.be  Fact Sheet for Healthcare Providers: https://www.woods-mathews.com/  This test is not yet approved or cleared by the [place] FDA and  has been authorized for detection and/or diagnosis of SARS-CoV-2 by FDA under an Emergency Use Authorization (EUA). This EUA will remain  in effect (meaning this test can be used) for the duration of the COVID-19 declaration under Se ction 564(b)(1) of the Act, 21 U.S.C. section  360bbb-3(b)(1), unless the authorization is terminated or revoked sooner.  Performed at [facility], [place] [address] [place], [place] [address]      Labs: BNP (last 3 results) No results for input(s): BNP in the last 8760 hours. Basic Metabolic Panel: Recent Labs  Lab 05/11/20 0159 05/12/20 0707 05/13/20 0141 05/14/20 0105 05/15/20 0359  NA 136 136  --  137 138  K 3.6 3.2* 3.7 3.7 3.7  CL 103 104  --  103 104  CO2 24 23  --  23 22  GLUCOSE 120* 125*  --  118* 128*  BUN 6* 6*  --  5* 5*  CREATININE 0.69 0.72  --  0.69 0.67  CALCIUM 8.6* 8.6*  --  8.7* 8.8*  MG  --  1.7  --  1.8 1.7  PHOS  --  3.6  --   --  4.0   Liver Function Tests: Recent Labs  Lab 05/11/20 0159 05/12/20 0707  AST 60* 41  ALT 25 23  ALKPHOS 51 43  BILITOT 0.5 0.5  PROT 6.5 6.5  ALBUMIN 2.5* 2.5*   No results for input(s): LIPASE, AMYLASE in the last 168 hours. No results for input(s): AMMONIA in the last 168 hours. CBC: Recent Labs  Lab 05/11/20 0159 05/11/20 0159 05/12/20 0707 05/13/20 0141 05/14/20 0105 05/15/20 0359 05/16/20 0239  WBC 7.1  --  6.8 6.9 8.5 6.8  --   HGB 10.4*   < > 10.0* 9.6* 9.9* 9.8* 9.8*  HCT 31.7*   < > 30.7* 29.6* 30.1* 30.3* 29.9*  MCV 87.6  --  87.2 88.6 88.5 87.6  --   PLT 200  --  189 195 220 208  --    < > = values in this interval not displayed.   Cardiac Enzymes: No results for input(s): CKTOTAL, CKMB, CKMBINDEX, TROPONINI in the last 168 hours. BNP: Invalid input(s): POCBNP CBG: No results for input(s): GLUCAP in the last 168 hours. D-Dimer No results for input(s): DDIMER in the last 72 hours. Hgb A1c No results for input(s): HGBA1C in the last 72 hours. Lipid Profile No results for input(s): CHOL, HDL, LDLCALC, TRIG, CHOLHDL, LDLDIRECT in the last 72 hours. Thyroid function studies No results for input(s): TSH, T4TOTAL, T3FREE, THYROIDAB in the last 72 hours.  Invalid input(s): FREET3 Anemia work up No results for input(s):  VITAMINB12, FOLATE, FERRITIN, TIBC, IRON, RETICCTPCT in the last 72 hours. Urinalysis    Component Value Date/Time   COLORURINE YELLOW 04/20/2020 2020   APPEARANCEUR CLEAR 04/20/2020 2020   LABSPEC 1.030 04/20/2020 2020   PHURINE 5.0 04/20/2020 2020   GLUCOSEU NEGATIVE 04/20/2020 2020   HGBUR NEGATIVE 04/20/2020 2020   BILIRUBINUR MODERATE (A) 04/20/2020 2020   KETONESUR NEGATIVE 04/20/2020 2020   PROTEINUR NEGATIVE 04/20/2020 2020   NITRITE NEGATIVE 04/20/2020 2020   LEUKOCYTESUR NEGATIVE 04/20/2020 2020   Sepsis Labs Invalid input(s): PROCALCITONIN,  WBC,  LACTICIDVEN Microbiology Recent Results (from the past 240 hour(s))  Aerobic/Anaerobic Culture (surgical/deep wound)     Status: None   Collection Time: 05/07/20  5:22 PM   Specimen: Abdomen; Abscess  Result Value Ref Range Status   Specimen Description ABDOMEN ABSCESS  Final   Special Requests   Final    SUB HEPATIC FLUID NEAR PRIOR GASTRIC ULCER PERFORATION   Gram Stain   Final    ABUNDANT WBC PRESENT, PREDOMINANTLY PMN NO ORGANISMS SEEN    Culture   Final    RARE ESCHERICHIA COLI FEW CANDIDA TROPICALIS NO ANAEROBES ISOLATED Performed at [facility], [place] [address], [place], [place] [address]    Report Status 05/12/2020 FINAL  Final   Organism ID, Bacteria ESCHERICHIA COLI  Final      Susceptibility   Escherichia coli - MIC*  AMPICILLIN >=32 RESISTANT Resistant     CEFAZOLIN 16 SENSITIVE Sensitive     CEFEPIME <=0.12 SENSITIVE Sensitive     CEFTAZIDIME <=1 SENSITIVE Sensitive     CEFTRIAXONE <=0.25 SENSITIVE Sensitive     CIPROFLOXACIN <=0.25 SENSITIVE Sensitive     GENTAMICIN <=1 SENSITIVE Sensitive     IMIPENEM <=0.25 SENSITIVE Sensitive     TRIMETH/SULFA <=20 SENSITIVE Sensitive     AMPICILLIN/SULBACTAM >=32 RESISTANT Resistant     PIP/TAZO <=4 SENSITIVE Sensitive     * RARE ESCHERICHIA COLI  Surgical PCR screen     Status: None   Collection Time: 05/08/20 10:33 PM   Specimen: Nasal Mucosa;  Nasal Swab  Result Value Ref Range Status   MRSA, PCR NEGATIVE NEGATIVE Final   Staphylococcus aureus NEGATIVE NEGATIVE Final    Comment: (NOTE) The Xpert SA Assay (FDA approved for NASAL specimens in patients 23 years of age and older), is one component of a comprehensive surveillance program. It is not intended to diagnose infection nor to guide or monitor treatment. Performed at [facility], [place] [address], [place], [place] [address]   SARS CORONAVIRUS 2 (TAT 6-24 HRS) Nasopharyngeal Nasopharyngeal Swab     Status: None   Collection Time: 05/15/20  5:50 PM   Specimen: Nasopharyngeal Swab  Result Value Ref Range Status   SARS Coronavirus 2 NEGATIVE NEGATIVE Final    Comment: (NOTE) SARS-CoV-2 target nucleic acids are NOT DETECTED.  The SARS-CoV-2 RNA is generally detectable in upper and lower respiratory specimens during the acute phase of infection. Negative results do not preclude SARS-CoV-2 infection, do not rule out co-infections with other pathogens, and should not be used as the sole basis for treatment or other patient management decisions. Negative results must be combined with clinical observations, patient history, and epidemiological information. The expected result is Negative.  Fact Sheet for Patients: SugarRoll.be  Fact Sheet for Healthcare Providers: https://www.woods-mathews.com/  This test is not yet approved or cleared by the [place] FDA and  has been authorized for detection and/or diagnosis of SARS-CoV-2 by FDA under an Emergency Use Authorization (EUA). This EUA will remain  in effect (meaning this test can be used) for the duration of the COVID-19 declaration under Se ction 564(b)(1) of the Act, 21 U.S.C. section 360bbb-3(b)(1), unless the authorization is terminated or revoked sooner.  Performed at [facility], [place] [address] [place], [place] [address]      Time coordinating  discharge: Over 30 minutes  SIGNED:   Shawna Clamp, MD  [facility] 05/17/2020, 11:54 AM Pager   If 7PM-7AM, please contact night-coverage [email or web address]

## 2020-05-17 NOTE — TOC Transition Note (Signed)
Transition of Care (TOC) - CM/SW Discharge Note Maria Gibbons RN, BSN Transitions of Care Unit 4E- RN Case Manager See Treatment Team for direct phone #    Patient Details  Name: Maria Murphy MRN: [id number] Date of Birth: [birth year]  Transition of Care [facility]) CM/SW Contact:  Maria Patricia, RN Phone Number: 05/17/2020, 4:07 PM   Clinical Narrative:    Pt stable for transition today was to go to SNF however has changed her mind and has decided she does not want to go to STSNF and wants to go home to her sister's home- Maria Murphy [phone] Discussed at the bedside transition plan along with Maria Murphy CSW to be sure pt understood potential barriers for [facility] services and make sure pt had understanding that she would not receive as consistent of therapy as she would in a skilled facility- pt voiced understanding and "willing to take a chance" - she states she wants to go home with sister and not to SNF as she does not feel like the SNF offer is a good one.  Pt voiced she uses a cane PTA- will need a RW and 3n1 for home- will request orders from MD- will also need HHPT- and CM will try to secure services- choice offered Per CMS guidelines from medicare.gov website with star ratings (copy placed in shadow chart) per voiced she does not have a preference - defers to CM to call agencies and see if she can find one to accept referral. Pt provided consent to call sister and confirm plans to come to her home with [facility] services- call made however sister did not answer- msg left with return call info provided.   Notified MD of change in transition plan for home with [facility]- and needed orders- MD to work on changing d/c paperwork and place needed orders.   Call made to [employer] for DME needs- RW and 3n1 to be delivered to room prior to discharge  Calls made to [facility] with [facility] for [facility] needs-  [facility]- unable to accept - no PT staff available  [facility]- OON with  Medicaid [facility]- able to accept for HHPT- with start of care for [facility] 9/27 or Tues 9/28  Spoke with pt again to inform of secured [facility] agency- pt voices her appreciation.   1620- sister returned call and confirmed she has spoken with her sister and is in agreement with plan to have her come to her home and will assist. Sister will plan to transport once DME has been delivered.     Final next level of care: [place] Barriers to Discharge: Barriers Resolved   Patient Goals and CMS Choice Patient states their goals for this hospitalization and ongoing recovery are:: return home with sister CMS Medicare.gov Compare Post Acute Care list provided to:: Patient Choice offered to / list presented to : Patient  Discharge Placement                Home with [facility]        Discharge Plan and Services In-house Referral: Clinical Social Work Discharge Planning Services: CM Consult Post Acute Care Choice: Home Health          DME Arranged: 3-N-1, [place] rolling DME Agency: [facility] Date DME Agency Contacted: 05/17/20 Time DME Agency Contacted: [phone] Representative spoke with at DME Agency: [place]: PT [place]: [place] Date [place]: 05/17/20 Time [place]: [id number] Representative spoke  with at [place]: [place] ([place]) Interventions     Readmission Risk Interventions Readmission Risk Prevention Plan 05/17/2020 04/29/2020  Medication Screening - Complete  Transportation Screening Complete Complete  PCP or Specialist Appt within 5-7 Days Complete -  Home Care Screening Complete -  Medication Review (RN CM) Complete -  Some recent data might be hidden

## 2020-05-25 ENCOUNTER — Encounter (HOSPITAL_COMMUNITY): Payer: Self-pay | Admitting: Emergency Medicine

## 2020-05-25 ENCOUNTER — Emergency Department (HOSPITAL_COMMUNITY)
Admission: EM | Admit: 2020-05-25 | Discharge: 2020-05-25 | Disposition: A | Discharge status: Home / Self Care | Payer: Medicaid Other | Attending: Emergency Medicine | Admitting: Emergency Medicine

## 2020-05-25 ENCOUNTER — Other Ambulatory Visit: Payer: Self-pay

## 2020-05-25 DIAGNOSIS — Z85820 Personal history of malignant melanoma of skin: Secondary | ICD-10-CM | POA: Insufficient documentation

## 2020-05-25 DIAGNOSIS — G8929 Other chronic pain: Secondary | ICD-10-CM | POA: Insufficient documentation

## 2020-05-25 DIAGNOSIS — M25562 Pain in left knee: Secondary | ICD-10-CM | POA: Diagnosis not present

## 2020-05-25 DIAGNOSIS — M25561 Pain in right knee: Secondary | ICD-10-CM | POA: Diagnosis not present

## 2020-05-25 DIAGNOSIS — Z79899 Other long term (current) drug therapy: Secondary | ICD-10-CM | POA: Insufficient documentation

## 2020-05-25 DIAGNOSIS — Z7901 Long term (current) use of anticoagulants: Secondary | ICD-10-CM | POA: Insufficient documentation

## 2020-05-25 DIAGNOSIS — I10 Essential (primary) hypertension: Secondary | ICD-10-CM | POA: Diagnosis not present

## 2020-05-25 MED ORDER — OXYCODONE HCL 5 MG PO TABS
5.0000 mg | ORAL_TABLET | Freq: Once | ORAL | Status: AC
  Administered 2020-05-25: 5 mg via ORAL
  Filled 2020-05-25: qty 1

## 2020-05-25 MED ORDER — ACETAMINOPHEN 500 MG PO TABS
1000.0000 mg | ORAL_TABLET | Freq: Once | ORAL | Status: AC
  Administered 2020-05-25: 1000 mg via ORAL
  Filled 2020-05-25: qty 2

## 2020-05-25 NOTE — ED Triage Notes (Signed)
Pt. Stated, I need to get a Cortizone shot in my knees so I can walk and feel better.
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
## 2020-05-25 NOTE — Discharge Instructions (Addendum)
Take tylenol 650 mg every 6 hours as needed for pain. Follow up with primary care and orthopaedics for further chronic pain management

## 2020-05-26 ENCOUNTER — Other Ambulatory Visit: Payer: Self-pay

## 2020-05-26 ENCOUNTER — Encounter (HOSPITAL_COMMUNITY): Payer: Self-pay | Admitting: Emergency Medicine

## 2020-05-26 ENCOUNTER — Ambulatory Visit (HOSPITAL_COMMUNITY)
Admission: EM | Admit: 2020-05-26 | Discharge: 2020-05-26 | Disposition: A | Discharge status: Home / Self Care | Payer: Medicaid Other | Attending: Family Medicine | Admitting: Family Medicine

## 2020-05-26 DIAGNOSIS — L089 Local infection of the skin and subcutaneous tissue, unspecified: Secondary | ICD-10-CM

## 2020-05-26 DIAGNOSIS — T148XXA Other injury of unspecified body region, initial encounter: Secondary | ICD-10-CM | POA: Diagnosis not present

## 2020-05-26 MED ORDER — CEPHALEXIN 500 MG PO CAPS: 500.0000 mg | ORAL_CAPSULE | Freq: Four times a day (QID) | ORAL | 0 refills | Status: AC

## 2020-05-26 NOTE — ED Triage Notes (Signed)
Patient reports she needs a wound rechecked.  8/29 had a procedure done.  Incision has ben healing nicely.  Noticed pain and oozing 2 days ago.  Surgical wound to abdomen.  Visible oozing on dressing and is having pain--these are new symptoms

## 2020-05-26 NOTE — Discharge Instructions (Addendum)
Take the antibiotics as prescribed Follow up with your doctor on Tuesday as planned for recheck.
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
## 2020-05-28 ENCOUNTER — Ambulatory Visit (INDEPENDENT_AMBULATORY_CARE_PROVIDER_SITE_OTHER): Payer: Medicaid Other | Admitting: General Surgery

## 2020-05-28 ENCOUNTER — Encounter: Payer: Self-pay | Admitting: General Surgery

## 2020-05-28 ENCOUNTER — Other Ambulatory Visit: Payer: Self-pay

## 2020-05-28 VITALS — BP 130/78 | HR 63 | Temp 98.4°F | Resp 14 | Ht 64.0 in | Wt 190.0 lb

## 2020-05-28 DIAGNOSIS — Z09 Encounter for follow-up examination after completed treatment for conditions other than malignant neoplasm: Secondary | ICD-10-CM

## 2020-05-28 NOTE — Progress Notes (Signed)
Subjective:     Maria Murphy  Here for wound check.  Patient had a small blister opened up along the upper aspect of her incision.  She was started on antibiotics in the emergency room.  She was started 3 days ago.  She denies any fevers.  She is otherwise doing well.  No gastric contents noted from wound. Objective:    BP 130/78   Pulse 63   Temp 98.4 F (36.9 C) (Oral)   Resp 14   Ht 5\' 4"  (1.626 m)   Wt 190 lb (86.2 kg)   SpO2 96%   BMI 32.61 kg/m   General:  alert, cooperative and no distress  Abdomen soft, incision with small punctate granulomatous opening present along the superior aspect.  No significant purulent drainage.  Silver nitrate applied.     Assessment:    Small wound opening along superior aspect of the incision.  Does not appear infected.    Plan:   Patient told to continue keeping wound clean and dry.  Finish antibiotic course.  Follow-up here in 1 week for wound check.

## 2020-06-04 ENCOUNTER — Ambulatory Visit: Payer: Medicaid Other | Admitting: General Surgery

## 2020-06-25 ENCOUNTER — Ambulatory Visit: Payer: Medicaid Other | Admitting: Gastroenterology

## 2020-07-05 ENCOUNTER — Other Ambulatory Visit: Payer: Self-pay

## 2020-07-05 ENCOUNTER — Inpatient Hospital Stay (HOSPITAL_COMMUNITY)
Admission: EM | Admit: 2020-07-05 | Discharge: 2020-07-12 | DRG: 383 | Disposition: A | Discharge status: Home / Self Care | Payer: Medicaid Other | Attending: Internal Medicine | Admitting: Internal Medicine

## 2020-07-05 ENCOUNTER — Encounter (HOSPITAL_COMMUNITY): Payer: Self-pay

## 2020-07-05 ENCOUNTER — Emergency Department (HOSPITAL_COMMUNITY): Payer: Medicaid Other

## 2020-07-05 DIAGNOSIS — Z91018 Allergy to other foods: Secondary | ICD-10-CM

## 2020-07-05 DIAGNOSIS — Z20822 Contact with and (suspected) exposure to covid-19: Secondary | ICD-10-CM | POA: Diagnosis present

## 2020-07-05 DIAGNOSIS — Z85828 Personal history of other malignant neoplasm of skin: Secondary | ICD-10-CM

## 2020-07-05 DIAGNOSIS — K275 Chronic or unspecified peptic ulcer, site unspecified, with perforation: Secondary | ICD-10-CM | POA: Diagnosis present

## 2020-07-05 DIAGNOSIS — R413 Other amnesia: Secondary | ICD-10-CM | POA: Diagnosis present

## 2020-07-05 DIAGNOSIS — I2692 Saddle embolus of pulmonary artery without acute cor pulmonale: Secondary | ICD-10-CM | POA: Diagnosis present

## 2020-07-05 DIAGNOSIS — Z7901 Long term (current) use of anticoagulants: Secondary | ICD-10-CM

## 2020-07-05 DIAGNOSIS — K449 Diaphragmatic hernia without obstruction or gangrene: Secondary | ICD-10-CM | POA: Diagnosis present

## 2020-07-05 DIAGNOSIS — R1110 Vomiting, unspecified: Secondary | ICD-10-CM

## 2020-07-05 DIAGNOSIS — Z83438 Family history of other disorder of lipoprotein metabolism and other lipidemia: Secondary | ICD-10-CM

## 2020-07-05 DIAGNOSIS — Y92009 Unspecified place in unspecified non-institutional (private) residence as the place of occurrence of the external cause: Secondary | ICD-10-CM

## 2020-07-05 DIAGNOSIS — K315 Obstruction of duodenum: Secondary | ICD-10-CM | POA: Diagnosis present

## 2020-07-05 DIAGNOSIS — R1011 Right upper quadrant pain: Secondary | ICD-10-CM

## 2020-07-05 DIAGNOSIS — K862 Cyst of pancreas: Secondary | ICD-10-CM | POA: Diagnosis present

## 2020-07-05 DIAGNOSIS — W19XXXA Unspecified fall, initial encounter: Secondary | ICD-10-CM | POA: Diagnosis present

## 2020-07-05 DIAGNOSIS — Z8249 Family history of ischemic heart disease and other diseases of the circulatory system: Secondary | ICD-10-CM

## 2020-07-05 DIAGNOSIS — R109 Unspecified abdominal pain: Secondary | ICD-10-CM | POA: Diagnosis present

## 2020-07-05 DIAGNOSIS — I10 Essential (primary) hypertension: Secondary | ICD-10-CM | POA: Diagnosis present

## 2020-07-05 DIAGNOSIS — K8020 Calculus of gallbladder without cholecystitis without obstruction: Secondary | ICD-10-CM | POA: Diagnosis present

## 2020-07-05 DIAGNOSIS — Z79899 Other long term (current) drug therapy: Secondary | ICD-10-CM

## 2020-07-05 DIAGNOSIS — K269 Duodenal ulcer, unspecified as acute or chronic, without hemorrhage or perforation: Principal | ICD-10-CM | POA: Diagnosis present

## 2020-07-05 DIAGNOSIS — T39395A Adverse effect of other nonsteroidal anti-inflammatory drugs [NSAID], initial encounter: Secondary | ICD-10-CM | POA: Diagnosis present

## 2020-07-05 DIAGNOSIS — F419 Anxiety disorder, unspecified: Secondary | ICD-10-CM | POA: Diagnosis present

## 2020-07-05 DIAGNOSIS — Z811 Family history of alcohol abuse and dependence: Secondary | ICD-10-CM

## 2020-07-05 LAB — COMPREHENSIVE METABOLIC PANEL
ALT: 10 U/L (ref 0–44)
AST: 17 U/L (ref 15–41)
Albumin: 4.2 g/dL (ref 3.5–5.0)
Alkaline Phosphatase: 54 U/L (ref 38–126)
Anion gap: 9 (ref 5–15)
BUN: 9 mg/dL (ref 8–23)
CO2: 26 mmol/L (ref 22–32)
Calcium: 9.2 mg/dL (ref 8.9–10.3)
Chloride: 103 mmol/L (ref 98–111)
Creatinine, Ser: 0.71 mg/dL (ref 0.44–1.00)
GFR, Estimated: >60 mL/min (ref >60)
Glucose, Bld: 121 mg/dL — ABNORMAL HIGH (ref 70–99)
Potassium: 3.8 mmol/L (ref 3.5–5.1)
Sodium: 138 mmol/L (ref 135–145)
Total Bilirubin: 0.7 mg/dL (ref 0.3–1.2)
Total Protein: 7.3 g/dL (ref 6.5–8.1)

## 2020-07-05 LAB — CBC
HCT: 37.4 % (ref 36.0–46.0)
Hemoglobin: 12.1 g/dL (ref 12.0–15.0)
MCH: 29.1 pg (ref 26.0–34.0)
MCHC: 32.4 g/dL (ref 30.0–36.0)
MCV: 89.9 fL (ref 80.0–100.0)
Platelets: 210 10*3/uL (ref 150–400)
RBC: 4.16 MIL/uL (ref 3.87–5.11)
RDW: 14.6 % (ref 11.5–15.5)
WBC: 6.6 10*3/uL (ref 4.0–10.5)
nRBC: 0 % (ref 0.0–0.2)

## 2020-07-05 LAB — LIPASE, BLOOD: Lipase: 30 U/L (ref 11–51)

## 2020-07-05 MED ORDER — PIPERACILLIN-TAZOBACTAM 3.375 G IVPB 30 MIN
3.3750 g | Freq: Once | INTRAVENOUS | Status: AC
  Administered 2020-07-05: 3.375 g via INTRAVENOUS
  Filled 2020-07-05: qty 50

## 2020-07-05 MED ORDER — ONDANSETRON HCL 4 MG/2ML IJ SOLN
4.0000 mg | Freq: Once | INTRAMUSCULAR | Status: AC
  Administered 2020-07-05: 4 mg via INTRAVENOUS
  Filled 2020-07-05: qty 2

## 2020-07-05 MED ORDER — MORPHINE SULFATE (PF) 4 MG/ML IV SOLN
4.0000 mg | Freq: Once | INTRAVENOUS | Status: AC
  Administered 2020-07-05: 4 mg via INTRAVENOUS
  Filled 2020-07-05: qty 1

## 2020-07-05 MED ORDER — IOHEXOL 300 MG/ML  SOLN
100.0000 mL | Freq: Once | INTRAMUSCULAR | Status: AC | PRN
  Administered 2020-07-05: 100 mL via INTRAVENOUS

## 2020-07-05 MED ORDER — SODIUM CHLORIDE 0.9 % IV BOLUS
1000.0000 mL | Freq: Once | INTRAVENOUS | Status: AC
  Administered 2020-07-05: 1000 mL via INTRAVENOUS
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
## 2020-07-05 NOTE — ED Triage Notes (Signed)
Pt reports right sided abdominal pain since yesterday. Vomiting green bile. Pt reports abdomen feels like it cramping

## 2020-07-06 ENCOUNTER — Other Ambulatory Visit: Payer: Self-pay

## 2020-07-06 ENCOUNTER — Encounter (HOSPITAL_COMMUNITY): Payer: Self-pay | Admitting: Family Medicine

## 2020-07-06 DIAGNOSIS — Z83438 Family history of other disorder of lipoprotein metabolism and other lipidemia: Secondary | ICD-10-CM | POA: Diagnosis not present

## 2020-07-06 DIAGNOSIS — W19XXXA Unspecified fall, initial encounter: Secondary | ICD-10-CM | POA: Diagnosis present

## 2020-07-06 DIAGNOSIS — K8020 Calculus of gallbladder without cholecystitis without obstruction: Secondary | ICD-10-CM | POA: Diagnosis present

## 2020-07-06 DIAGNOSIS — Z20822 Contact with and (suspected) exposure to covid-19: Secondary | ICD-10-CM | POA: Diagnosis present

## 2020-07-06 DIAGNOSIS — R109 Unspecified abdominal pain: Secondary | ICD-10-CM | POA: Diagnosis not present

## 2020-07-06 DIAGNOSIS — K862 Cyst of pancreas: Secondary | ICD-10-CM | POA: Diagnosis present

## 2020-07-06 DIAGNOSIS — Z7901 Long term (current) use of anticoagulants: Secondary | ICD-10-CM | POA: Diagnosis not present

## 2020-07-06 DIAGNOSIS — K449 Diaphragmatic hernia without obstruction or gangrene: Secondary | ICD-10-CM | POA: Diagnosis present

## 2020-07-06 DIAGNOSIS — R1013 Epigastric pain: Secondary | ICD-10-CM | POA: Diagnosis not present

## 2020-07-06 DIAGNOSIS — Y92009 Unspecified place in unspecified non-institutional (private) residence as the place of occurrence of the external cause: Secondary | ICD-10-CM | POA: Diagnosis not present

## 2020-07-06 DIAGNOSIS — Z811 Family history of alcohol abuse and dependence: Secondary | ICD-10-CM | POA: Diagnosis not present

## 2020-07-06 DIAGNOSIS — I2692 Saddle embolus of pulmonary artery without acute cor pulmonale: Secondary | ICD-10-CM | POA: Diagnosis present

## 2020-07-06 DIAGNOSIS — R1011 Right upper quadrant pain: Secondary | ICD-10-CM | POA: Diagnosis present

## 2020-07-06 DIAGNOSIS — Z8711 Personal history of peptic ulcer disease: Secondary | ICD-10-CM | POA: Diagnosis not present

## 2020-07-06 DIAGNOSIS — I10 Essential (primary) hypertension: Secondary | ICD-10-CM | POA: Diagnosis present

## 2020-07-06 DIAGNOSIS — Z79899 Other long term (current) drug therapy: Secondary | ICD-10-CM | POA: Diagnosis not present

## 2020-07-06 DIAGNOSIS — T39395A Adverse effect of other nonsteroidal anti-inflammatory drugs [NSAID], initial encounter: Secondary | ICD-10-CM | POA: Diagnosis present

## 2020-07-06 DIAGNOSIS — K275 Chronic or unspecified peptic ulcer, site unspecified, with perforation: Secondary | ICD-10-CM | POA: Diagnosis not present

## 2020-07-06 DIAGNOSIS — R1010 Upper abdominal pain, unspecified: Secondary | ICD-10-CM | POA: Diagnosis not present

## 2020-07-06 DIAGNOSIS — R933 Abnormal findings on diagnostic imaging of other parts of digestive tract: Secondary | ICD-10-CM | POA: Diagnosis not present

## 2020-07-06 DIAGNOSIS — R112 Nausea with vomiting, unspecified: Secondary | ICD-10-CM | POA: Diagnosis not present

## 2020-07-06 DIAGNOSIS — K315 Obstruction of duodenum: Secondary | ICD-10-CM | POA: Diagnosis present

## 2020-07-06 DIAGNOSIS — Z85828 Personal history of other malignant neoplasm of skin: Secondary | ICD-10-CM | POA: Diagnosis not present

## 2020-07-06 DIAGNOSIS — Z91018 Allergy to other foods: Secondary | ICD-10-CM | POA: Diagnosis not present

## 2020-07-06 DIAGNOSIS — F419 Anxiety disorder, unspecified: Secondary | ICD-10-CM | POA: Diagnosis present

## 2020-07-06 DIAGNOSIS — Z8249 Family history of ischemic heart disease and other diseases of the circulatory system: Secondary | ICD-10-CM | POA: Diagnosis not present

## 2020-07-06 DIAGNOSIS — K269 Duodenal ulcer, unspecified as acute or chronic, without hemorrhage or perforation: Secondary | ICD-10-CM | POA: Diagnosis present

## 2020-07-06 DIAGNOSIS — R413 Other amnesia: Secondary | ICD-10-CM | POA: Diagnosis present

## 2020-07-06 LAB — URINALYSIS, ROUTINE W REFLEX MICROSCOPIC
Bilirubin Urine: NEGATIVE
Glucose, UA: NEGATIVE mg/dL
Hgb urine dipstick: NEGATIVE
Ketones, ur: 20 mg/dL — AB
Leukocytes,Ua: NEGATIVE
Nitrite: NEGATIVE
Protein, ur: NEGATIVE mg/dL
Specific Gravity, Urine: 1.013 (ref 1.005–1.030)
pH: 6 (ref 5.0–8.0)

## 2020-07-06 LAB — CBC WITH DIFFERENTIAL/PLATELET
Abs Immature Granulocytes: 0.05 10*3/uL (ref 0.00–0.07)
Basophils Absolute: 0 10*3/uL (ref 0.0–0.1)
Basophils Relative: 0 %
Eosinophils Absolute: 0 10*3/uL (ref 0.0–0.5)
Eosinophils Relative: 0 %
HCT: 35.6 % — ABNORMAL LOW (ref 36.0–46.0)
Hemoglobin: 11.1 g/dL — ABNORMAL LOW (ref 12.0–15.0)
Immature Granulocytes: 1 %
Lymphocytes Relative: 10 %
Lymphs Abs: 0.8 10*3/uL (ref 0.7–4.0)
MCH: 29.1 pg (ref 26.0–34.0)
MCHC: 31.2 g/dL (ref 30.0–36.0)
MCV: 93.2 fL (ref 80.0–100.0)
Monocytes Absolute: 0.6 10*3/uL (ref 0.1–1.0)
Monocytes Relative: 8 %
Neutro Abs: 6.2 10*3/uL (ref 1.7–7.7)
Neutrophils Relative %: 81 %
Platelets: 182 10*3/uL (ref 150–400)
RBC: 3.82 MIL/uL — ABNORMAL LOW (ref 3.87–5.11)
RDW: 14.4 % (ref 11.5–15.5)
WBC: 7.7 10*3/uL (ref 4.0–10.5)
nRBC: 0 % (ref 0.0–0.2)

## 2020-07-06 LAB — COMPREHENSIVE METABOLIC PANEL
ALT: 10 U/L (ref 0–44)
AST: 17 U/L (ref 15–41)
Albumin: 3.6 g/dL (ref 3.5–5.0)
Alkaline Phosphatase: 47 U/L (ref 38–126)
Anion gap: 7 (ref 5–15)
BUN: 10 mg/dL (ref 8–23)
CO2: 23 mmol/L (ref 22–32)
Calcium: 8.3 mg/dL — ABNORMAL LOW (ref 8.9–10.3)
Chloride: 106 mmol/L (ref 98–111)
Creatinine, Ser: 0.65 mg/dL (ref 0.44–1.00)
GFR, Estimated: >60 mL/min (ref >60)
Glucose, Bld: 109 mg/dL — ABNORMAL HIGH (ref 70–99)
Potassium: 3.6 mmol/L (ref 3.5–5.1)
Sodium: 136 mmol/L (ref 135–145)
Total Bilirubin: 0.8 mg/dL (ref 0.3–1.2)
Total Protein: 6.2 g/dL — ABNORMAL LOW (ref 6.5–8.1)

## 2020-07-06 LAB — MAGNESIUM: Magnesium: 1.8 mg/dL (ref 1.7–2.4)

## 2020-07-06 LAB — RESPIRATORY PANEL BY RT PCR (FLU A&B, COVID)
Influenza A by PCR: NEGATIVE
Influenza B by PCR: NEGATIVE
SARS Coronavirus 2 by RT PCR: NEGATIVE

## 2020-07-06 LAB — TSH: TSH: 0.69 u[IU]/mL (ref 0.350–4.500)

## 2020-07-06 MED ORDER — HEPARIN SODIUM (PORCINE) 5000 UNIT/ML IJ SOLN
5000.0000 [IU] | Freq: Three times a day (TID) | INTRAMUSCULAR | Status: DC
  Administered 2020-07-06 (×2): 5000 [IU] via SUBCUTANEOUS
  Filled 2020-07-06 (×2): qty 1

## 2020-07-06 MED ORDER — ONDANSETRON HCL 4 MG/2ML IJ SOLN
4.0000 mg | Freq: Four times a day (QID) | INTRAMUSCULAR | Status: DC | PRN
  Administered 2020-07-06 – 2020-07-07 (×2): 4 mg via INTRAVENOUS
  Filled 2020-07-06 (×2): qty 2

## 2020-07-06 MED ORDER — PIPERACILLIN-TAZOBACTAM 3.375 G IVPB
3.3750 g | Freq: Three times a day (TID) | INTRAVENOUS | Status: DC
  Administered 2020-07-06: 3.375 g via INTRAVENOUS
  Filled 2020-07-06: qty 50

## 2020-07-06 MED ORDER — MORPHINE SULFATE (PF) 4 MG/ML IV SOLN
4.0000 mg | INTRAVENOUS | Status: DC | PRN
  Administered 2020-07-06 – 2020-07-12 (×6): 4 mg via INTRAVENOUS
  Filled 2020-07-06 (×7): qty 1

## 2020-07-06 MED ORDER — HYDRALAZINE HCL 20 MG/ML IJ SOLN: 10.0000 mg | Freq: Four times a day (QID) | INTRAMUSCULAR | Status: DC | PRN

## 2020-07-06 MED ORDER — ONDANSETRON HCL 4 MG PO TABS
4.0000 mg | ORAL_TABLET | Freq: Four times a day (QID) | ORAL | Status: DC | PRN
  Administered 2020-07-06: 4 mg via ORAL
  Filled 2020-07-06: qty 1

## 2020-07-06 MED ORDER — PANTOPRAZOLE SODIUM 40 MG IV SOLR
40.0000 mg | Freq: Two times a day (BID) | INTRAVENOUS | Status: DC
  Administered 2020-07-06 – 2020-07-08 (×5): 40 mg via INTRAVENOUS
  Filled 2020-07-06 (×5): qty 40

## 2020-07-06 MED ORDER — RIVAROXABAN 20 MG PO TABS
20.0000 mg | ORAL_TABLET | Freq: Every day | ORAL | Status: DC
  Administered 2020-07-06 – 2020-07-08 (×3): 20 mg via ORAL
  Filled 2020-07-06 (×4): qty 1

## 2020-07-06 MED ORDER — LORAZEPAM 2 MG/ML IJ SOLN: 0.5000 mg | Freq: Four times a day (QID) | INTRAMUSCULAR | Status: DC | PRN

## 2020-07-06 MED ORDER — SODIUM CHLORIDE 0.9 % IV SOLN: INTRAVENOUS | Status: AC

## 2020-07-06 NOTE — Progress Notes (Signed)
Pharmacy Antibiotic Note  Maria Murphy is a 64 y.o. female admitted on 07/05/2020 with intra-abdominal infection.  Pharmacy has been consulted for zosyn dosing. CT of the abdomen shows possible  Inflammatory fistula versus recurrent ulcer Plan: Zosyn 3.375g IV q8h (4 hour infusion).  F/U cxs and clinical progress Monitor V/S, labs  Height: 5\' 4"  (162.6 cm) Weight: 86.2 kg (190 lb) IBW/kg (Calculated) : 54.7  Temp (24hrs), Avg:98 F (36.7 C), Min:97.9 F (36.6 C), Max:98.1 F (36.7 C)  Recent Labs  Lab 07/05/20 1554 07/06/20 0538  WBC 6.6 7.7  CREATININE 0.71 0.65    Estimated Creatinine Clearance: 76.5 mL/min (by C-G formula based on SCr of 0.65 mg/dL).    Allergies  Allergen Reactions  . Wheat Extract Swelling    Antimicrobials this admission: Zosyn 11/13 >>   Microbiology results: 11/13 BCx: pending   Thank you for allowing pharmacy to be a part of this patient's care.  Isac Sarna, BS [place], [place] Clinical Pharmacist Pager [phone] 07/06/2020 10:22 AM

## 2020-07-06 NOTE — H&P (Signed)
TRH H&P    Patient Demographics:    Maria Murphy, is a 64 y.o. female  MRN: [id number]  DOB - 1955-10-31  Admit Date - 07/05/2020  Referring MD/NP/PA: Geralyn Corwin  Outpatient Primary MD for the patient is Patient, No Pcp Per  Patient coming from: Home  Chief complaint- Abdominal pain, nausea, and vomiting   HPI:    Maria Murphy  is a 64 y.o. female, with history of PE, memory loss, hypertension, skin cancer, and more presents the ED with a chief complaint of abdominal pain.  Patient reports that she had onset of dizziness 2 days ago, when sitting or standing, feeling like the room was spinning around her.  The next day she started having stomach pain in the right quadrant described as sharp.  She reports no radiation in the stomach pain.  She also developed crampy stomach pain in her epigastric region.  That pain radiated down to her umbilicus.  Patient started feeling nauseous and vomited 10 times.  She reports that she had not eaten in 2 days so the emesis appeared as bile.  She reports no blood or coffee-ground emesis.  She also reports diarrhea 4-5 times per day.  She took Pepto-Bismol which helped.  Before Pepto her bowel movements were watery brown, after Pepto they were partially formed.  She reports no hematochezia or melena.  Her pain was unaffected by her bowel movements.  She reports that she has not been feverish, but she takes Tylenol all the time.  She does report feeling chilled.  She has had increased fatigue over the last 2 days, and she has acid reflux which she has not had before.  She reports that last night she woke up with palpitations several times during the night.  She has not sure how long palpitations lasted.  On review of systems she also admits a temporary pruritic skin rash on her right arm that resolved on its own.  Patient reports no smoking, no illicit drugs.  She drinks a glass of wine  on Friday and Sunday.  She is vaccinated for Covid.  She is full code  Chart review reveals an extended stay at [facility] a couple of months ago.  She was admitted on August 29 and discharged on September 24.  At that time she came in with abdominal pain and the working diagnosis was biliary pancreatitis.  An MRCP showed free fluid in the abdomen and pneumoperitoneum.  Patient was started on broad-spectrum antibiotics and also required vasopressors.  Emergent laparotomy was done that showed proof of prepyloric area of lesser curvature of stomach.  Surgical culture grew Enterococcus and Candida.  Her stay was complicated by an abscess in her pelvis on repeat CT.  She also had multifocal pulmonary embolus in saddle distribution with right heart strain, submassive PE.  Patient was transferred to [facility] and started on heparin drip.  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  IR aspiration of fluid was done.  Culture of that fluid  grew E. coli.  Patient was then put on Zosyn.  She improved and was discharged on oral antibiotics with follow-up with Dr. Arnoldo Morale.  She did follow-up on October 5 and she was noted to have an opening in her wound.  She was told to finish her antibiotics and follow-up next week.  Patient was lost to follow-up after that.  In the ER Temperature 98.1, heart rate 57, respiratory rate 18, blood pressure 147/68, satting at 93% No leukocytosis with a white blood cell count of 6.6, hemoglobin 12.1 Chemistry panel is unremarkable Lipase of 30 CT abdomen showed recent gastric ulcer repair with postop fluid that could be indicative of inflammatory fistula versus recurrent ulcer, recommend endoscopy.  Cholelithiasis without cholecystitis.  Stable cyst on head of pancreas compatible with recent pancreatitis.  Stable bilateral renal cysts.  Diverticulosis without diverticulitis, fibroid uterus. Covid and flu negative N.p.o. UA  pending Morphine, Zofran, Zosyn, 1 L NaCl, blood cultures GI consulted and reports that they will see her in the morning    Review of systems:    In addition to the HPI above,  No Fever patient admits to chills, No Headache, No changes with Vision or hearing, No problems swallowing food or Liquids, No Chest pain, Cough or Shortness of Breath, Admits to abdominal pain, admits to nausea and vomiting, admits to diarrhea, admits to acid reflux  no Blood in stool or Urine, No dysuria, No new skin rashes or bruises, No new joints pains-aches,  No new weakness, tingling, numbness in any extremity, No recent weight gain or loss, No polyuria, polydypsia or polyphagia, No significant Mental Stressors.  All other systems reviewed and are negative.    Past History of the following :    Past Medical History:  Diagnosis Date  . Essential hypertension, benign 01/24/2015  . Foot ulcer ([place])   . Hypertension   . Memory loss   . Skin cancer   . Vision abnormalities       Past Surgical History:  Procedure Laterality Date  . CENTRAL LINE INSERTION Right 04/22/2020   Procedure: CENTRAL LINE INSERTION;  Surgeon: Virl Cagey, MD;  Location: [facility];  Service: General;  Laterality: Right;  . GASTRORRHAPHY  04/22/2020   Procedure: GASTRORRHAPHY;  Surgeon: Virl Cagey, MD;  Location: [facility];  Service: General;;  . IR CATHETER TUBE CHANGE  05/09/2020  . LAPAROTOMY N/A 04/22/2020   Procedure: EXPLORATORY LAPAROTOMY;  Surgeon: Virl Cagey, MD;  Location: [facility];  Service: General;  Laterality: N/A;  . TONSILLECTOMY        Social History:      Social History   Tobacco Use  . Smoking status: Never Smoker  . Smokeless tobacco: Never Used  Substance Use Topics  . Alcohol use: No    Alcohol/week: 1.0 standard drink    Types: 1 Glasses of wine per week    Comment: drank alcohol for past 2 weeks, one glass of wine. Prior to this would drink glass of alcohol on weekends with  dinner.        Family History :     Family History  Problem Relation Age of Onset  . High Cholesterol Mother   . Hypertension Mother   . Arthritis/Rheumatoid Mother   . Alcohol abuse Father   . Colon cancer Neg Hx   . Colon polyps Neg Hx       Home Medications:   Prior to Admission medications   Medication Sig Start Date End Date Taking?  Authorizing Provider  acetaminophen (TYLENOL) 500 MG tablet Take 1 tablet (500 mg total) by mouth every 6 (six) hours as needed. 08/10/18   Evalee Jefferson, PA-C  citalopram (CELEXA) 20 MG tablet Take 1 tablet (20 mg total) by mouth daily. 08/16/19   Jaynee Eagles, PA-C  donepezil (ARICEPT) 10 MG tablet Take 5-10 mg by mouth in the morning and at bedtime.  04/02/20   [provider]  fluconazole (DIFLUCAN) 200 MG tablet Take 2 tablets (400 mg total) by mouth daily. 05/18/20   Shawna Clamp, MD  gabapentin (NEURONTIN) 300 MG capsule Take 300 mg by mouth 2 (two) times daily.  08/23/19   [provider]  Ginger, Zingiber officinalis, (GINGER PO) Take 1 tablet by mouth daily.     [provider]  lisinopril-hydrochlorothiazide (PRINZIDE,ZESTORETIC) 10-12.5 MG tablet Take 1 tablet by mouth daily. 08/07/18   [provider]  LORazepam (ATIVAN) 1 MG tablet Take 1 tablet (1 mg total) by mouth every 6 (six) hours as needed for anxiety. 05/06/20   Arrien, Jimmy Picket, MD  oxyCODONE (OXY IR/ROXICODONE) 5 MG immediate release tablet Take 1 tablet (5 mg total) by mouth every 6 (six) hours as needed for moderate pain or severe pain. 05/06/20   Arrien, Jimmy Picket, MD  pantoprazole (PROTONIX) 40 MG tablet Take 1 tablet (40 mg total) by mouth 2 (two) times daily. 05/06/20 06/05/20  Arrien, Jimmy Picket, MD  Rivaroxaban (XARELTO) 15 MG TABS tablet Take 1 tablet (15 mg total) by mouth 2 (two) times daily with a meal for 17 days. Complete on 05/23/20 05/06/20 05/23/20  Arrien, Jimmy Picket, MD  rivaroxaban (XARELTO) 20 MG TABS  tablet Take 1 tablet (20 mg total) by mouth daily with supper. Start 05/24/20 05/24/20   Arrien, Jimmy Picket, MD  traZODone (DESYREL) 100 MG tablet Take 200 mg by mouth at bedtime.  08/02/19   [provider]     Allergies:     Allergies  Allergen Reactions  . Wheat Extract Swelling     Physical Exam:   Vitals  Blood pressure (!) 147/68, pulse 60, temperature 98.1 F (36.7 C), temperature source Oral, resp. rate 20, height 5\' 4"  (1.626 m), weight 86.2 kg, SpO2 93 %.  1.  General: Lying supine in bed asleep, no acute distress  2. Psychiatric: Mood and behavior normal for situation, pleasant and cooperative with exam  3. Neurologic: Cranial nerves II through XII grossly intact, moves all 4 extremities voluntarily, no focal deficit on limited exam  4. HEENMT:  Head is atraumatic, normocephalic, pupils reactive to light, neck is supple, trachea is midline thyromegaly on palpation, mucous membranes are moist  5. Respiratory : Lungs are clear to auscultation bilaterally without wheeze rhonchi or rales  6. Cardiovascular : Heart rate is normal, rhythm is regular, no murmurs rubs or gallops Peripheral edema present  7. Gastrointestinal:  Abdomen is soft, nondistended, tender to the right upper quadrant, bowel sounds hyperactive  8. Skin:  No acute lesions on limited skin exam  9.Musculoskeletal:  Peripheral edema, no acute deformity, no calf tenderness    Data Review:    CBC Recent Labs  Lab 07/05/20 1554  WBC 6.6  HGB 12.1  HCT 37.4  PLT 210  MCV 89.9  MCH 29.1  MCHC 32.4  RDW 14.6   ------------------------------------------------------------------------------------------------------------------  Results for orders placed or performed during the hospital encounter of 07/05/20 (from the past 48 hour(s))  Lipase, blood     Status: None   Collection Time:  07/05/20  3:54 PM  Result Value Ref Range   Lipase 30 11 - 51 U/L    Comment: Performed at  [facility], [address], [place], [place] [address]  Comprehensive metabolic panel     Status: Abnormal   Collection Time: 07/05/20  3:54 PM  Result Value Ref Range   Sodium 138 135 - 145 mmol/L   Potassium 3.8 3.5 - 5.1 mmol/L   Chloride 103 98 - 111 mmol/L   CO2 26 22 - 32 mmol/L   Glucose, Bld 121 (H) 70 - 99 mg/dL    Comment: Glucose reference range applies only to samples taken after fasting for at least 8 hours.   BUN 9 8 - 23 mg/dL   Creatinine, Ser 0.71 0.44 - 1.00 mg/dL   Calcium 9.2 8.9 - 10.3 mg/dL   Total Protein 7.3 6.5 - 8.1 g/dL   Albumin 4.2 3.5 - 5.0 g/dL   AST 17 15 - 41 U/L   ALT 10 0 - 44 U/L   Alkaline Phosphatase 54 38 - 126 U/L   Total Bilirubin 0.7 0.3 - 1.2 mg/dL   GFR, Estimated >60 >60 mL/min    Comment: (NOTE) Calculated using the CKD-EPI Creatinine Equation (2021)    Anion gap 9 5 - 15    Comment: Performed at [facility], [address] [place], [place] [address]  CBC     Status: None   Collection Time: 07/05/20  3:54 PM  Result Value Ref Range   WBC 6.6 4.0 - 10.5 K/uL   RBC 4.16 3.87 - 5.11 MIL/uL   Hemoglobin 12.1 12.0 - 15.0 g/dL   HCT 37.4 36 - 46 %   MCV 89.9 80.0 - 100.0 fL   MCH 29.1 26.0 - 34.0 pg   MCHC 32.4 30.0 - 36.0 g/dL   RDW 14.6 11.5 - 15.5 %   Platelets 210 150 - 400 K/uL   nRBC 0.0 0.0 - 0.2 %    Comment: Performed at [facility], [address], [place], [place] [address]    Chemistries  Recent Labs  Lab 07/05/20 1554  NA 138  K 3.8  CL 103  CO2 26  GLUCOSE 121*  BUN 9  CREATININE 0.71  CALCIUM 9.2  AST 17  ALT 10  ALKPHOS 54  BILITOT 0.7   ------------------------------------------------------------------------------------------------------------------  ------------------------------------------------------------------------------------------------------------------ GFR: Estimated Creatinine Clearance: 76.5 mL/min (by C-G formula based on SCr of 0.71 mg/dL). Liver Function Tests: Recent Labs  Lab  07/05/20 1554  AST 17  ALT 10  ALKPHOS 54  BILITOT 0.7  PROT 7.3  ALBUMIN 4.2   Recent Labs  Lab 07/05/20 1554  LIPASE 30   No results for input(s): AMMONIA in the last 168 hours. Coagulation Profile: No results for input(s): INR, PROTIME in the last 168 hours. Cardiac Enzymes: No results for input(s): CKTOTAL, CKMB, CKMBINDEX, TROPONINI in the last 168 hours. BNP (last 3 results) No results for input(s): PROBNP in the last 8760 hours. HbA1C: No results for input(s): HGBA1C in the last 72 hours. CBG: No results for input(s): GLUCAP in the last 168 hours. Lipid Profile: No results for input(s): CHOL, HDL, LDLCALC, TRIG, CHOLHDL, LDLDIRECT in the last 72 hours. Thyroid Function Tests: No results for input(s): TSH, T4TOTAL, FREET4, T3FREE, THYROIDAB in the last 72 hours. Anemia Panel: No results for input(s): VITAMINB12, FOLATE, FERRITIN, TIBC, IRON, RETICCTPCT in the last 72 hours.  --------------------------------------------------------------------------------------------------------------- Urine analysis:    Component Value Date/Time   COLORURINE YELLOW 04/20/2020  [place] 04/20/2020 2020   LABSPEC 1.030 04/20/2020 2020   PHURINE 5.0 04/20/2020 2020   GLUCOSEU NEGATIVE 04/20/2020 2020   HGBUR NEGATIVE 04/20/2020 2020   BILIRUBINUR MODERATE (A) 04/20/2020 2020   KETONESUR NEGATIVE 04/20/2020 2020   PROTEINUR NEGATIVE 04/20/2020 2020   NITRITE NEGATIVE 04/20/2020 2020   LEUKOCYTESUR NEGATIVE 04/20/2020 2020      Imaging Results:    CT Abdomen Pelvis W Contrast  Result Date: 07/05/2020 CLINICAL DATA:  Right upper and right lower quadrant abdominal pain, vomiting EXAM: CT ABDOMEN AND PELVIS WITH CONTRAST TECHNIQUE: Multidetector CT imaging of the abdomen and pelvis was performed using the standard protocol following bolus administration of intravenous contrast. CONTRAST:  143mL OMNIPAQUE IOHEXOL 300 MG/ML  SOLN COMPARISON:  05/13/2020 FINDINGS:  Lower chest: No acute pleural or parenchymal lung disease. Bibasilar hypoventilatory changes. Hepatobiliary: Multiple calcified gallstones again identified without cholecystitis. The liver is unremarkable. Pancreas: Stable cystic structure in the pancreatic head measuring 2.8 cm, with associated coarse calcifications, likely representing residual pseudocyst after previous pancreatitis. There are no acute inflammatory changes. Spleen: Normal in size without focal abnormality. Adrenals/Urinary Tract: Adrenal glands are unremarkable. There are numerous bilateral peripelvic and cortical cysts within the kidneys. Complex peripherally calcified cyst upper pole right kidney measures 2 cm. These have been previously characterized by MRI and shown to demonstrate benign characteristics. The bladder is unremarkable. Stomach/Bowel: No bowel obstruction or ileus. Diverticulosis of the descending and sigmoid colon without diverticulitis. Normal appendix in the right lower quadrant. There is a small amount of free fluid in the right upper quadrant adjacent to the gastric antrum/pylorus. A small tract containing a punctate focus of gas extends from the gastric wall toward the porta hepatis, reference image 26 series 2 and image 34 series 5. Differential diagnosis would be postoperative/post inflammatory fistula versus Recurrent ulceration. No evidence of abscess. Vascular/Lymphatic: No significant vascular findings are present. No enlarged abdominal or pelvic lymph nodes. Reproductive: Enlarged lobular uterus consistent with multiple fibroids, stable. No adnexal masses. Other: Trace free fluid and punctate focus of extraluminal gas in the right upper quadrant as above. No evidence of abscess. Postsurgical changes from previous midline laparotomy. No abdominal wall hernia. Musculoskeletal: No acute or destructive bony lesions. Reconstructed images demonstrate no additional findings. IMPRESSION: 1. Trace free fluid in the right upper  quadrant surrounding the gastric antrum/pylorus, with a small amount of gas tracking into the adjacent sub hepatic space. This is in the region of previous gastric ulcer repair and postoperative fluid collection. Differential diagnosis would include postoperative/post inflammatory fistula versus Recurrent ulceration. Endoscopy may be useful for further evaluation. 2. Cholelithiasis with no evidence of cholecystitis. 3. Stable cyst in the head of the pancreas, consistent with sequela of previous pancreatitis. 4. Stable bilateral renal cysts. 5. Diverticulosis without diverticulitis. 6. Fibroid uterus. These results were called by telephone at the time of interpretation on 07/05/2020 at 9:51pm to provider DR LONG, who verbally acknowledged these results. Electronically Signed   By: Randa Ngo M.D.   On: 07/05/2020 21:56       Assessment & Plan:    Active Problems:   Abdominal pain   1. Abdominal pain 1. Possibly postop inflammatory fistula versus recurrent ulcer 2. N.p.o. 3. Protonix IV twice daily 4. Consult GI for possible endoscopy 5. Morphine for pain 6. Zofran for nausea 7. Zosyn 8. Continue to monitor 2. Pulmonary embolism 1. Pulmonary embolism at last hospitalization 2. Discharged on Xarelto 3. Holding Xarelto in case of surgical  procedure 4. Continue to monitor 3. Hypertension 1. Holding p.o. medications for strict n.p.o. 2. Hydralazine as needed 4. Anxiety 1. Holding p.o. Ativan for strict n.p.o. 2. As needed IV Ativan 5.    DVT Prophylaxis-   Heparin- SCDs   AM Labs Ordered, also please review Full Orders  Family Communication: No family at bedside Code Status: Full  Admission status: Inpatient :The appropriate admission status for this patient is INPATIENT. Inpatient status is judged to be reasonable and necessary in order to provide the required intensity of service to ensure the patient's safety. The patient's presenting symptoms, physical exam findings, and  initial radiographic and laboratory data in the context of their chronic comorbidities is felt to place them at high risk for further clinical deterioration. Furthermore, it is not anticipated that the patient will be medically stable for discharge from the hospital within 2 midnights of admission. The following factors support the admission status of inpatient.     The patient's presenting symptoms include abdominal pain. The worrisome physical exam findings include right-sided abdominal tenderness The initial radiographic and laboratory data are worrisome because of CT shows postop fluid that could be indicative of inflammatory fistula versus recurrent ulcer recommended endoscopy The chronic co-morbidities include memory loss, hypertension, PE       * I certify that at the point of admission it is my clinical judgment that the patient will require inpatient hospital care spanning beyond 2 midnights from the point of admission due to high intensity of service, high risk for further deterioration and high frequency of surveillance required.*  Time spent in minutes : [place]

## 2020-07-06 NOTE — Consult Note (Signed)
Consulting  Provider: Dr. Clearence Ped Primary Care Physician:  Patient, No Pcp Per  Reason for Consultation: Abnormal CT scan, abdominal pain  HPI:  Maria Murphy is a 64 y.o. female with a past medical history of hypertension, memory loss, who presented to the [facility] ER last evening with abdominal pain.  Patient has a complicated past GI history.  She presented at the end of August with abdominal pain, found to have a perforated prepyloric ulcer in the setting of chronic NSAID use, underwent exploratory laparotomy with surgical fixation of this.  Her postoperative course was complicated by saddle pulmonary embolus and numerous pelvic abscesses requiring transfer to [facility] for further treatment.  She was discharged 3 to 4 weeks later and states she was doing pretty well until yesterday when she began having severe right upper quadrant and right lower quadrant pain.  Also notes nausea and vomiting for the past 2 to 3 days.  States she has been unable to even tolerate water without vomiting.  No melena or hematochezia.  No hematemesis.   Work-up in the ER revealed patient was hemodynamically stable.  No leukocytosis, hemoglobin 12.1.CT abdomen pelvis which I personally reviewed showed questionable fistulous track into the right subhepatic space though no evidence of perforation or abscess formation.  She states that after receiving morphine her symptoms pretty much resolved.  Past Medical History:  Diagnosis Date  . Essential hypertension, benign 01/24/2015  . Foot ulcer ([place])   . Hypertension   . Memory loss   . Skin cancer   . Vision abnormalities     Past Surgical History:  Procedure Laterality Date  . CENTRAL LINE INSERTION Right 04/22/2020   Procedure: CENTRAL LINE INSERTION;  Surgeon: Virl Cagey, MD;  Location: [facility];  Service: General;  Laterality: Right;  . GASTRORRHAPHY  04/22/2020   Procedure: GASTRORRHAPHY;  Surgeon: Virl Cagey, MD;  Location: [facility];   Service: General;;  . IR CATHETER TUBE CHANGE  05/09/2020  . LAPAROTOMY N/A 04/22/2020   Procedure: EXPLORATORY LAPAROTOMY;  Surgeon: Virl Cagey, MD;  Location: [facility];  Service: General;  Laterality: N/A;  . TONSILLECTOMY      Prior to Admission medications   Medication Sig Start Date End Date Taking? Authorizing Provider  acetaminophen (TYLENOL) 500 MG tablet Take 1 tablet (500 mg total) by mouth every 6 (six) hours as needed. 08/10/18   Evalee Jefferson, PA-C  citalopram (CELEXA) 20 MG tablet Take 1 tablet (20 mg total) by mouth daily. 08/16/19   Jaynee Eagles, PA-C  donepezil (ARICEPT) 10 MG tablet Take 5-10 mg by mouth in the morning and at bedtime.  04/02/20   [provider]  fluconazole (DIFLUCAN) 200 MG tablet Take 2 tablets (400 mg total) by mouth daily. 05/18/20   Shawna Clamp, MD  gabapentin (NEURONTIN) 300 MG capsule Take 300 mg by mouth 2 (two) times daily.  08/23/19   [provider]  Ginger, Zingiber officinalis, (GINGER PO) Take 1 tablet by mouth daily.     [provider]  lisinopril-hydrochlorothiazide (PRINZIDE,ZESTORETIC) 10-12.5 MG tablet Take 1 tablet by mouth daily. 08/07/18   [provider]  LORazepam (ATIVAN) 1 MG tablet Take 1 tablet (1 mg total) by mouth every 6 (six) hours as needed for anxiety. 05/06/20   Arrien, Jimmy Picket, MD  oxyCODONE (OXY IR/ROXICODONE) 5 MG immediate release tablet Take 1 tablet (5 mg total) by mouth every 6 (six) hours as needed for moderate pain or severe pain. 05/06/20  Arrien, Jimmy Picket, MD  pantoprazole (PROTONIX) 40 MG tablet Take 1 tablet (40 mg total) by mouth 2 (two) times daily. 05/06/20 06/05/20  Arrien, Jimmy Picket, MD  Rivaroxaban (XARELTO) 15 MG TABS tablet Take 1 tablet (15 mg total) by mouth 2 (two) times daily with a meal for 17 days. Complete on 05/23/20 05/06/20 05/23/20  Arrien, Jimmy Picket, MD  rivaroxaban (XARELTO) 20 MG TABS tablet Take 1 tablet (20 mg total) by mouth  daily with supper. Start 05/24/20 05/24/20   Arrien, Jimmy Picket, MD  traZODone (DESYREL) 100 MG tablet Take 200 mg by mouth at bedtime.  08/02/19   [provider]    Current Facility-Administered Medications  Medication Dose Route Frequency Provider Last Rate Last Admin  . 0.9 %  sodium chloride infusion   Intravenous Continuous Zierle-Ghosh, Asia B, DO 50 mL/hr at 07/06/20 0528 New Bag at 07/06/20 0528  . heparin injection 5,000 Units  5,000 Units Subcutaneous Q8H Zierle-Ghosh, Asia B, DO   5,000 Units at 07/06/20 0528  . hydrALAZINE (APRESOLINE) injection 10 mg  10 mg Intravenous Q6H PRN Zierle-Ghosh, Asia B, DO      . LORazepam (ATIVAN) injection 0.5 mg  0.5 mg Intravenous Q6H PRN Zierle-Ghosh, Asia B, DO      . morphine 4 MG/ML injection 4 mg  4 mg Intravenous Q4H PRN Zierle-Ghosh, Asia B, DO   4 mg at 07/06/20 1008  . ondansetron (ZOFRAN) tablet 4 mg  4 mg Oral Q6H PRN Zierle-Ghosh, Asia B, DO       Or  . ondansetron (ZOFRAN) injection 4 mg  4 mg Intravenous Q6H PRN Zierle-Ghosh, Asia B, DO   4 mg at 07/06/20 1008  . pantoprazole (PROTONIX) injection 40 mg  40 mg Intravenous Q12H Zierle-Ghosh, Asia B, DO   40 mg at 07/06/20 0927  . piperacillin-tazobactam (ZOSYN) IVPB 3.375 g  3.375 g Intravenous Q8H Memon, Jolaine Artist, MD 12.5 mL/hr at 07/06/20 1032 3.375 g at 07/06/20 1032   Current Outpatient Medications  Medication Sig Dispense Refill  . acetaminophen (TYLENOL) 500 MG tablet Take 1 tablet (500 mg total) by mouth every 6 (six) hours as needed. 30 tablet 0  . citalopram (CELEXA) 20 MG tablet Take 1 tablet (20 mg total) by mouth daily. 30 tablet 0  . donepezil (ARICEPT) 10 MG tablet Take 5-10 mg by mouth in the morning and at bedtime.     . fluconazole (DIFLUCAN) 200 MG tablet Take 2 tablets (400 mg total) by mouth daily. 10 tablet 0  . gabapentin (NEURONTIN) 300 MG capsule Take 300 mg by mouth 2 (two) times daily.     . Ginger, Zingiber officinalis, (GINGER PO) Take 1 tablet  by mouth daily.     Marland Kitchen lisinopril-hydrochlorothiazide (PRINZIDE,ZESTORETIC) 10-12.5 MG tablet Take 1 tablet by mouth daily.    Marland Kitchen LORazepam (ATIVAN) 1 MG tablet Take 1 tablet (1 mg total) by mouth every 6 (six) hours as needed for anxiety. 10 tablet 0  . oxyCODONE (OXY IR/ROXICODONE) 5 MG immediate release tablet Take 1 tablet (5 mg total) by mouth every 6 (six) hours as needed for moderate pain or severe pain. 10 tablet 0  . pantoprazole (PROTONIX) 40 MG tablet Take 1 tablet (40 mg total) by mouth 2 (two) times daily. 60 tablet 0  . Rivaroxaban (XARELTO) 15 MG TABS tablet Take 1 tablet (15 mg total) by mouth 2 (two) times daily with a meal for 17 days. Complete on 05/23/20 42 tablet 0  . rivaroxaban (XARELTO)  20 MG TABS tablet Take 1 tablet (20 mg total) by mouth daily with supper. Start 05/24/20 30 tablet 0  . traZODone (DESYREL) 100 MG tablet Take 200 mg by mouth at bedtime.       Allergies as of 07/05/2020 - Review Complete 07/05/2020  Allergen Reaction Noted  . Wheat extract Swelling 10/29/2017    Family History  Problem Relation Age of Onset  . High Cholesterol Mother   . Hypertension Mother   . Arthritis/Rheumatoid Mother   . Alcohol abuse Father   . Colon cancer Neg Hx   . Colon polyps Neg Hx     Social History   Socioeconomic History  . Marital status: Widowed    Spouse name: Not on file  . Number of children: Not on file  . Years of education: Not on file  . Highest education level: Not on file  Occupational History  . Not on file  Tobacco Use  . Smoking status: Never Smoker  . Smokeless tobacco: Never Used  Vaping Use  . Vaping Use: Never used  Substance and Sexual Activity  . Alcohol use: No    Alcohol/week: 1.0 standard drink    Types: 1 Glasses of wine per week    Comment: drank alcohol for past 2 weeks, one glass of wine. Prior to this would drink glass of alcohol on weekends with dinner.   . Drug use: No  . Sexual activity: Not Currently    Birth  control/protection: Post-menopausal  Other Topics Concern  . Not on file  Social History Narrative  . Not on file   Social Determinants of Health   Financial Resource Strain:   . Difficulty of Paying Living Expenses: Not on file  Food Insecurity:   . Worried About Charity fundraiser in the Last Year: Not on file  . Ran Out of Food in the Last Year: Not on file  Transportation Needs:   . Lack of Transportation (Medical): Not on file  . Lack of Transportation (Non-Medical): Not on file  Physical Activity:   . Days of Exercise per Week: Not on file  . Minutes of Exercise per Session: Not on file  Stress:   . Feeling of Stress : Not on file  Social Connections:   . Frequency of Communication with Friends and Family: Not on file  . Frequency of Social Gatherings with Friends and Family: Not on file  . Attends Religious Services: Not on file  . Active Member of Clubs or Organizations: Not on file  . Attends Archivist Meetings: Not on file  . Marital Status: Not on file  Intimate Partner Violence:   . Fear of Current or Ex-Partner: Not on file  . Emotionally Abused: Not on file  . Physically Abused: Not on file  . Sexually Abused: Not on file    Review of Systems: General: Negative for anorexia, weight loss, fever, chills, fatigue, weakness. Eyes: Negative for vision changes.  ENT: Negative for hoarseness, difficulty swallowing , nasal congestion. CV: Negative for chest pain, angina, palpitations, dyspnea on exertion, peripheral edema.  Respiratory: Negative for dyspnea at rest, dyspnea on exertion, cough, sputum, wheezing.  GI: See history of present illness. GU:  Negative for dysuria, hematuria, urinary incontinence, urinary frequency, nocturnal urination.  MS: Negative for joint pain, low back pain.  Derm: Negative for rash or itching.  Neuro: Negative for weakness, abnormal sensation, seizure, frequent headaches, memory loss, confusion.  Psych: Negative for  anxiety, depression, suicidal ideation, hallucinations.  Endo: Negative for unusual weight change.  Heme: Negative for bruising or bleeding. Allergy: Negative for rash or hives.  Physical Exam: Vital signs in last 24 hours: Temp:  [97.9 F (36.6 C)-98.1 F (36.7 C)] 97.9 F (36.6 C) (11/13 0800) Pulse Rate:  [51-95] 58 (11/13 1200) Resp:  [15-29] 19 (11/13 1200) BP: (115-177)/(48-104) 137/75 (11/13 1200) SpO2:  [93 %-100 %] 95 % (11/13 1200) Weight:  [86.2 kg] 86.2 kg (11/12 1458)   General:   Alert,  Well-developed, well-nourished, pleasant and cooperative in NAD Head:  Normocephalic and atraumatic. Eyes:  Sclera clear, no icterus.   Conjunctiva pink. Ears:  Normal auditory acuity. Nose:  No deformity, discharge,  or lesions. Mouth:  No deformity or lesions, dentition normal. Neck:  Supple; no masses or thyromegaly. Lungs:  Clear throughout to auscultation.   No wheezes, crackles, or rhonchi. No acute distress. Heart:  Regular rate and rhythm; no murmurs, clicks, rubs,  or gallops. Abdomen:  Soft, nontender and nondistended. No masses, hepatosplenomegaly or hernias noted. Normal bowel sounds, without guarding, and without rebound.   Rectal:  Deferred until time of colonoscopy.   Msk:  Symmetrical without gross deformities. Normal posture. Pulses:  Normal pulses noted. Extremities:  Without clubbing or edema. Neurologic:  Alert and  oriented x4;  grossly normal neurologically. Skin:  Intact without significant lesions or rashes. Cervical Nodes:  No significant cervical adenopathy. Psych:  Alert and cooperative. Normal mood and affect.  Intake/Output from previous day: 11/12 0701 - 11/13 0700 In: 1342.8 [IV Piggyback:1342.8] Out: -  Intake/Output this shift: No intake/output data recorded.  Lab Results: Recent Labs    07/05/20 1554 07/06/20 0538  WBC 6.6 7.7  HGB 12.1 11.1*  HCT 37.4 35.6*  PLT 210 182   BMET Recent Labs    07/05/20 1554 07/06/20 0538  NA 138  136  K 3.8 3.6  CL 103 106  CO2 26 23  GLUCOSE 121* 109*  BUN 9 10  CREATININE 0.71 0.65  CALCIUM 9.2 8.3*   LFT Recent Labs    07/05/20 1554 07/06/20 0538  PROT 7.3 6.2*  ALBUMIN 4.2 3.6  AST 17 17  ALT 10 10  ALKPHOS 54 47  BILITOT 0.7 0.8   PT/INR No results for input(s): LABPROT, INR in the last 72 hours. Hepatitis Panel No results for input(s): HEPBSAG, HCVAB, HEPAIGM, HEPBIGM in the last 72 hours. C-Diff No results for input(s): CDIFFTOX in the last 72 hours.  Studies/Results: CT Abdomen Pelvis W Contrast  Result Date: 07/05/2020 CLINICAL DATA:  Right upper and right lower quadrant abdominal pain, vomiting EXAM: CT ABDOMEN AND PELVIS WITH CONTRAST TECHNIQUE: Multidetector CT imaging of the abdomen and pelvis was performed using the standard protocol following bolus administration of intravenous contrast. CONTRAST:  120mL OMNIPAQUE IOHEXOL 300 MG/ML  SOLN COMPARISON:  05/13/2020 FINDINGS: Lower chest: No acute pleural or parenchymal lung disease. Bibasilar hypoventilatory changes. Hepatobiliary: Multiple calcified gallstones again identified without cholecystitis. The liver is unremarkable. Pancreas: Stable cystic structure in the pancreatic head measuring 2.8 cm, with associated coarse calcifications, likely representing residual pseudocyst after previous pancreatitis. There are no acute inflammatory changes. Spleen: Normal in size without focal abnormality. Adrenals/Urinary Tract: Adrenal glands are unremarkable. There are numerous bilateral peripelvic and cortical cysts within the kidneys. Complex peripherally calcified cyst upper pole right kidney measures 2 cm. These have been previously characterized by MRI and shown to demonstrate benign characteristics. The bladder is unremarkable. Stomach/Bowel: No bowel obstruction or ileus. Diverticulosis of the descending  and sigmoid colon without diverticulitis. Normal appendix in the right lower quadrant. There is a small amount of  free fluid in the right upper quadrant adjacent to the gastric antrum/pylorus. A small tract containing a punctate focus of gas extends from the gastric wall toward the porta hepatis, reference image 26 series 2 and image 34 series 5. Differential diagnosis would be postoperative/post inflammatory fistula versus Recurrent ulceration. No evidence of abscess. Vascular/Lymphatic: No significant vascular findings are present. No enlarged abdominal or pelvic lymph nodes. Reproductive: Enlarged lobular uterus consistent with multiple fibroids, stable. No adnexal masses. Other: Trace free fluid and punctate focus of extraluminal gas in the right upper quadrant as above. No evidence of abscess. Postsurgical changes from previous midline laparotomy. No abdominal wall hernia. Musculoskeletal: No acute or destructive bony lesions. Reconstructed images demonstrate no additional findings. IMPRESSION: 1. Trace free fluid in the right upper quadrant surrounding the gastric antrum/pylorus, with a small amount of gas tracking into the adjacent sub hepatic space. This is in the region of previous gastric ulcer repair and postoperative fluid collection. Differential diagnosis would include postoperative/post inflammatory fistula versus Recurrent ulceration. Endoscopy may be useful for further evaluation. 2. Cholelithiasis with no evidence of cholecystitis. 3. Stable cyst in the head of the pancreas, consistent with sequela of previous pancreatitis. 4. Stable bilateral renal cysts. 5. Diverticulosis without diverticulitis. 6. Fibroid uterus. These results were called by telephone at the time of interpretation on 07/05/2020 at 9:51pm to provider DR LONG, who verbally acknowledged these results. Electronically Signed   By: Randa Ngo M.D.   On: 07/05/2020 21:56    Impression: *Abdominal pain *Nausea and vomiting *History of peptic ulcer disease with perforation status post surgical repair  Plan: Discussed case in depth with  surgery and looked at CT findings.  Findings are likely all postsurgical in nature.  Does not appear that she has any active abscess formation or perforation at this time.  We will hold off on EGD at this juncture.  Recommend stopping antibiotics as she has not had any evidence of infection including fever or leukocytosis.  Patient to be started on a full liquid diet and advance as tolerated.  Would continue on IV PPI while inpatient and switch to p.o. once stable for discharge.  GI to continue to follow along.  Elon Alas. Abbey Chatters, D.O. Gastroenterology and Hepatology [facility]    LOS: 0 days     07/06/2020, 12:26 PM

## 2020-07-06 NOTE — ED Notes (Signed)
Pt is stable and holding for a med surg bed. Will obtained per med surg protocol.

## 2020-07-06 NOTE — Consult Note (Signed)
Reason for Consult:  Abdominal pain Referring Physician: Dr. Margarita Mail Pujol is an 64 y.o. female.  HPI: Patient is a 64 year old black female status post gastrorrhaphy in September of this year with a complicated postoperative course including saddle pulmonary embolus and pelvic abscess who presents with a 24-hour history of nausea, vomiting, diarrhea, and right-sided abdominal pain.  She was seen in the emergency room and given pain medications.  She states her abdominal pain has mostly resolved.  A CT scan of the abdomen was performed which revealed a question of a fistulous track into the right subhepatic space, though there is no abscess or no free air present.  She has been given pain medications and states her abdominal pain has mostly resolved.  She was supposed to be on Xarelto for her saddle pulmonary embolus, but she has not taken it.  She denies any fever or chills.  Past Medical History:  Diagnosis Date  . Essential hypertension, benign 01/24/2015  . Foot ulcer ([place])   . Hypertension   . Memory loss   . Skin cancer   . Vision abnormalities     Past Surgical History:  Procedure Laterality Date  . CENTRAL LINE INSERTION Right 04/22/2020   Procedure: CENTRAL LINE INSERTION;  Surgeon: Virl Cagey, MD;  Location: [facility];  Service: General;  Laterality: Right;  . GASTRORRHAPHY  04/22/2020   Procedure: GASTRORRHAPHY;  Surgeon: Virl Cagey, MD;  Location: [facility];  Service: General;;  . IR CATHETER TUBE CHANGE  05/09/2020  . LAPAROTOMY N/A 04/22/2020   Procedure: EXPLORATORY LAPAROTOMY;  Surgeon: Virl Cagey, MD;  Location: [facility];  Service: General;  Laterality: N/A;  . TONSILLECTOMY      Family History  Problem Relation Age of Onset  . High Cholesterol Mother   . Hypertension Mother   . Arthritis/Rheumatoid Mother   . Alcohol abuse Father   . Colon cancer Neg Hx   . Colon polyps Neg Hx     Social History:  reports that she has never smoked. She has  never used smokeless tobacco. She reports that she does not drink alcohol and does not use drugs.  Allergies:  Allergies  Allergen Reactions  . Wheat Extract Swelling    Medications: I have reviewed the patient's current medications.  Results for orders placed or performed during the hospital encounter of 07/05/20 (from the past 48 hour(s))  Lipase, blood     Status: None   Collection Time: 07/05/20  3:54 PM  Result Value Ref Range   Lipase 30 11 - 51 U/L    Comment: Performed at [facility], [address], [place], [place] [address]  Comprehensive metabolic panel     Status: Abnormal   Collection Time: 07/05/20  3:54 PM  Result Value Ref Range   Sodium 138 135 - 145 mmol/L   Potassium 3.8 3.5 - 5.1 mmol/L   Chloride 103 98 - 111 mmol/L   CO2 26 22 - 32 mmol/L   Glucose, Bld 121 (H) 70 - 99 mg/dL    Comment: Glucose reference range applies only to samples taken after fasting for at least 8 hours.   BUN 9 8 - 23 mg/dL   Creatinine, Ser 0.71 0.44 - 1.00 mg/dL   Calcium 9.2 8.9 - 10.3 mg/dL   Total Protein 7.3 6.5 - 8.1 g/dL   Albumin 4.2 3.5 - 5.0 g/dL   AST 17 15 - 41 U/L   ALT 10 0 - 44 U/L  Alkaline Phosphatase 54 38 - 126 U/L   Total Bilirubin 0.7 0.3 - 1.2 mg/dL   GFR, Estimated >60 >60 mL/min    Comment: (NOTE) Calculated using the CKD-EPI Creatinine Equation (2021)    Anion gap 9 5 - 15    Comment: Performed at [facility], [address], [place], [place] [address]  CBC     Status: None   Collection Time: 07/05/20  3:54 PM  Result Value Ref Range   WBC 6.6 4.0 - 10.5 K/uL   RBC 4.16 3.87 - 5.11 MIL/uL   Hemoglobin 12.1 12.0 - 15.0 g/dL   HCT 37.4 36 - 46 %   MCV 89.9 80.0 - 100.0 fL   MCH 29.1 26.0 - 34.0 pg   MCHC 32.4 30.0 - 36.0 g/dL   RDW 14.6 11.5 - 15.5 %   Platelets 210 150 - 400 K/uL   nRBC 0.0 0.0 - 0.2 %    Comment: Performed at [facility], [address], [place], [place] [address]  Respiratory Panel by RT PCR (Flu A&B, Covid) - Nasopharyngeal  Swab     Status: None   Collection Time: 07/05/20 11:27 PM   Specimen: Nasopharyngeal Swab  Result Value Ref Range   SARS Coronavirus 2 by RT PCR NEGATIVE NEGATIVE   Influenza A by PCR NEGATIVE NEGATIVE   Influenza B by PCR NEGATIVE NEGATIVE    Comment: Performed at [facility], [address], [place], [place] [address]  Culture, blood (routine x 2)     Status: None (Preliminary result)   Collection Time: 07/06/20 12:59 AM   Specimen: BLOOD LEFT HAND  Result Value Ref Range   Specimen Description BLOOD LEFT HAND    Special Requests      BOTTLES DRAWN AEROBIC AND ANAEROBIC Blood Culture adequate volume   Culture      NO GROWTH < 12 HOURS Performed at [facility], [address] [place], [place] [address]    Report Status PENDING   Culture, blood (routine x 2)     Status: None (Preliminary result)   Collection Time: 07/06/20  1:05 AM   Specimen: Right Antecubital; Blood  Result Value Ref Range   Specimen Description RIGHT ANTECUBITAL    Special Requests      BOTTLES DRAWN AEROBIC AND ANAEROBIC Blood Culture adequate volume   Culture      NO GROWTH < 12 HOURS Performed at [facility], [address] [place], [place] [address]    Report Status PENDING   TSH     Status: None   Collection Time: 07/06/20  1:05 AM  Result Value Ref Range   TSH 0.690 0.350 - 4.500 uIU/mL    Comment: Performed by a 3rd Generation assay with a functional sensitivity of <=0.01 uIU/mL. Performed at [facility], [address] [place], [place] [address]   Comprehensive metabolic panel     Status: Abnormal   Collection Time: 07/06/20  5:38 AM  Result Value Ref Range   Sodium 136 135 - 145 mmol/L   Potassium 3.6 3.5 - 5.1 mmol/L   Chloride 106 98 - 111 mmol/L   CO2 23 22 - 32 mmol/L   Glucose, Bld 109 (H) 70 - 99 mg/dL    Comment: Glucose reference range applies only to samples taken after fasting for at least 8 hours.   BUN 10 8 - 23 mg/dL   Creatinine, Ser 0.65 0.44 - 1.00 mg/dL   Calcium 8.3  (L) 8.9 - 10.3 mg/dL  Total Protein 6.2 (L) 6.5 - 8.1 g/dL   Albumin 3.6 3.5 - 5.0 g/dL   AST 17 15 - 41 U/L   ALT 10 0 - 44 U/L   Alkaline Phosphatase 47 38 - 126 U/L   Total Bilirubin 0.8 0.3 - 1.2 mg/dL   GFR, Estimated >60 >60 mL/min    Comment: (NOTE) Calculated using the CKD-EPI Creatinine Equation (2021)    Anion gap 7 5 - 15    Comment: Performed at [facility], [address] [place], [place] [address]  Magnesium     Status: None   Collection Time: 07/06/20  5:38 AM  Result Value Ref Range   Magnesium 1.8 1.7 - 2.4 mg/dL    Comment: Performed at [facility], [address] [place], [place] [address]  CBC WITH DIFFERENTIAL     Status: Abnormal   Collection Time: 07/06/20  5:38 AM  Result Value Ref Range   WBC 7.7 4.0 - 10.5 K/uL   RBC 3.82 (L) 3.87 - 5.11 MIL/uL   Hemoglobin 11.1 (L) 12.0 - 15.0 g/dL   HCT 35.6 (L) 36 - 46 %   MCV 93.2 80.0 - 100.0 fL   MCH 29.1 26.0 - 34.0 pg   MCHC 31.2 30.0 - 36.0 g/dL   RDW 14.4 11.5 - 15.5 %   Platelets 182 150 - 400 K/uL   nRBC 0.0 0.0 - 0.2 %   Neutrophils Relative % 81 %   Neutro Abs 6.2 1.7 - 7.7 K/uL   Lymphocytes Relative 10 %   Lymphs Abs 0.8 0.7 - 4.0 K/uL   Monocytes Relative 8 %   Monocytes Absolute 0.6 0.1 - 1.0 K/uL   Eosinophils Relative 0 %   Eosinophils Absolute 0.0 0.0 - 0.5 K/uL   Basophils Relative 0 %   Basophils Absolute 0.0 0.0 - 0.1 K/uL   Immature Granulocytes 1 %   Abs Immature Granulocytes 0.05 0.00 - 0.07 K/uL    Comment: Performed at [facility], [address], [place], [place] [address]    CT Abdomen Pelvis W Contrast  Result Date: 07/05/2020 CLINICAL DATA:  Right upper and right lower quadrant abdominal pain, vomiting EXAM: CT ABDOMEN AND PELVIS WITH CONTRAST TECHNIQUE: Multidetector CT imaging of the abdomen and pelvis was performed using the standard protocol following bolus administration of intravenous contrast. CONTRAST:  151mL OMNIPAQUE IOHEXOL 300 MG/ML  SOLN COMPARISON:  05/13/2020  FINDINGS: Lower chest: No acute pleural or parenchymal lung disease. Bibasilar hypoventilatory changes. Hepatobiliary: Multiple calcified gallstones again identified without cholecystitis. The liver is unremarkable. Pancreas: Stable cystic structure in the pancreatic head measuring 2.8 cm, with associated coarse calcifications, likely representing residual pseudocyst after previous pancreatitis. There are no acute inflammatory changes. Spleen: Normal in size without focal abnormality. Adrenals/Urinary Tract: Adrenal glands are unremarkable. There are numerous bilateral peripelvic and cortical cysts within the kidneys. Complex peripherally calcified cyst upper pole right kidney measures 2 cm. These have been previously characterized by MRI and shown to demonstrate benign characteristics. The bladder is unremarkable. Stomach/Bowel: No bowel obstruction or ileus. Diverticulosis of the descending and sigmoid colon without diverticulitis. Normal appendix in the right lower quadrant. There is a small amount of free fluid in the right upper quadrant adjacent to the gastric antrum/pylorus. A small tract containing a punctate focus of gas extends from the gastric wall toward the porta hepatis, reference image 26 series 2 and image 34 series 5. Differential diagnosis would be postoperative/post inflammatory fistula versus Recurrent ulceration. No evidence  of abscess. Vascular/Lymphatic: No significant vascular findings are present. No enlarged abdominal or pelvic lymph nodes. Reproductive: Enlarged lobular uterus consistent with multiple fibroids, stable. No adnexal masses. Other: Trace free fluid and punctate focus of extraluminal gas in the right upper quadrant as above. No evidence of abscess. Postsurgical changes from previous midline laparotomy. No abdominal wall hernia. Musculoskeletal: No acute or destructive bony lesions. Reconstructed images demonstrate no additional findings. IMPRESSION: 1. Trace free fluid in the  right upper quadrant surrounding the gastric antrum/pylorus, with a small amount of gas tracking into the adjacent sub hepatic space. This is in the region of previous gastric ulcer repair and postoperative fluid collection. Differential diagnosis would include postoperative/post inflammatory fistula versus Recurrent ulceration. Endoscopy may be useful for further evaluation. 2. Cholelithiasis with no evidence of cholecystitis. 3. Stable cyst in the head of the pancreas, consistent with sequela of previous pancreatitis. 4. Stable bilateral renal cysts. 5. Diverticulosis without diverticulitis. 6. Fibroid uterus. These results were called by telephone at the time of interpretation on 07/05/2020 at 9:51pm to provider DR LONG, who verbally acknowledged these results. Electronically Signed   By: Randa Ngo M.D.   On: 07/05/2020 21:56    ROS:  Pertinent items are noted in HPI.  Blood pressure 134/67, pulse (!) 55, temperature 97.9 F (36.6 C), temperature source Oral, resp. rate 15, height 5\' 4"  (1.626 m), weight 86.2 kg, SpO2 94 %. Physical Exam: Pleasant black female no acute distress Head is normocephalic, atraumatic Lungs clear to auscultation with good breath sounds bilaterally Heart examination reveals regular rate and rhythm without S3, S4, murmurs Abdomen is soft, nontender, nondistended.  A well-healed upper midline surgical scars present.  Bowel sounds are present.  CT scan images personally reviewed  Assessment/Plan: Impression: Right-sided abdominal pain, nausea, and diarrhea of unknown etiology.  Given the CT findings, I feel these are mostly postoperative in nature and no active process is going on at this time.  I would not perform an EGD as it is known that she had peptic ulcer disease.  Will start full liquid diet.  Given that she has no leukocytosis, would stop Zosyn.  Would continue to monitor.  Definitely requires PPI coverage.  Discussed with Dr. Roderic Palau.  Aviva Signs 07/06/2020, 11:07 AM

## 2020-07-06 NOTE — ED Notes (Signed)
PT report called to [facility], verbalized complete understanding of pt report and current condition denies questions at this time. Pt resting on stretcher rail sup x 2 call light in hand. Pt remains a/o x 4 skin warm dry intact. Pt denies needs or pain at this time. Pt VSS NAD PT is hemodynamically stable at this time and awaiting transport to clean ready room.

## 2020-07-06 NOTE — Plan of Care (Signed)
  Problem: Health Behavior/Discharge Planning: Goal: Ability to manage health-related needs will improve Outcome: Progressing   Problem: Clinical Measurements: Goal: Ability to maintain clinical measurements within normal limits will improve Outcome: Progressing   Problem: Clinical Measurements: Goal: Will remain free from infection Outcome: Progressing   

## 2020-07-06 NOTE — Progress Notes (Signed)
Patient admitted to the hospital earlier this morning by Dr. Clearence Ped  Patient seen and examined.  Reports some indigestion in her epigastrium, otherwise abdominal pain appears to have improved.  No nausea or vomiting.  She is tender in her right abdomen, otherwise exam is unrevealing  Assessment/plan:  Abdominal pain -Etiology is unclear -CT of the abdomen shows possible.  Inflammatory fistula versus recurrent ulcer -Patient will be seen by GI and considered for possible colonoscopy -Keep n.p.o. for now -Continue supportive management with antiemetics and pain medicine -She is continued on Zosyn  Pulmonary embolism -On previous hospitalization, patient was diagnosed with pulmonary embolism -She has been discharged on Xarelto -Patient reports that she had only taken Xarelto for a month and has not taken any anticoagulants in approximately 2 weeks -We will need to restart anticoagulation once it is determined that she does not need any procedures -She is not short of breath at this time  Hypertension -Blood pressures currently stable -Resume oral medications when she is no longer n.p.o.  Kathie Dike

## 2020-07-07 DIAGNOSIS — K275 Chronic or unspecified peptic ulcer, site unspecified, with perforation: Secondary | ICD-10-CM | POA: Diagnosis not present

## 2020-07-07 DIAGNOSIS — R1013 Epigastric pain: Secondary | ICD-10-CM | POA: Diagnosis not present

## 2020-07-07 DIAGNOSIS — I2692 Saddle embolus of pulmonary artery without acute cor pulmonale: Secondary | ICD-10-CM

## 2020-07-07 MED ORDER — ONDANSETRON HCL 4 MG/2ML IJ SOLN
4.0000 mg | Freq: Four times a day (QID) | INTRAMUSCULAR | Status: DC
  Administered 2020-07-07 – 2020-07-12 (×16): 4 mg via INTRAVENOUS
  Filled 2020-07-07 (×16): qty 2

## 2020-07-07 MED ORDER — SUCRALFATE 1 GM/10ML PO SUSP
1.0000 g | Freq: Three times a day (TID) | ORAL | Status: DC
  Administered 2020-07-07 – 2020-07-12 (×15): 1 g via ORAL
  Filled 2020-07-07 (×16): qty 10

## 2020-07-07 MED ORDER — ACETAMINOPHEN 325 MG PO TABS
650.0000 mg | ORAL_TABLET | Freq: Four times a day (QID) | ORAL | Status: DC | PRN
  Administered 2020-07-07 – 2020-07-12 (×3): 650 mg via ORAL
  Filled 2020-07-07 (×3): qty 2

## 2020-07-07 MED ORDER — POTASSIUM CHLORIDE CRYS ER 20 MEQ PO TBCR
40.0000 meq | EXTENDED_RELEASE_TABLET | Freq: Once | ORAL | Status: AC
  Administered 2020-07-07: 40 meq via ORAL
  Filled 2020-07-07: qty 2

## 2020-07-07 NOTE — Progress Notes (Addendum)
Subjective: Patient states she had one episode of epigastric pain.  No nausea or vomiting is noted.  She is resting comfortably this morning.  She denies emesis.  Objective: Vital signs in last 24 hours: Temp:  [98.4 F (36.9 C)-99.7 F (37.6 C)] 99.7 F (37.6 C) (11/14 0500) Pulse Rate:  [50-79] 51 (11/14 0500) Resp:  [11-20] 16 (11/14 0500) BP: (119-151)/(62-104) 141/68 (11/14 0500) SpO2:  [93 %-98 %] 97 % (11/14 0500) Weight:  [85.5 kg-86.3 kg] 85.5 kg (11/14 0500) Last BM Date: 07/05/20  Intake/Output from previous day: 11/13 0701 - 11/14 0700 In: 475.2 [I.V.:375.2; IV Piggyback:100] Out: -  Intake/Output this shift: No intake/output data recorded.  General appearance: alert, cooperative and no distress GI: soft, non-tender; bowel sounds normal; no masses,  no organomegaly  Lab Results:  Recent Labs    07/05/20 1554 07/06/20 0538  WBC 6.6 7.7  HGB 12.1 11.1*  HCT 37.4 35.6*  PLT 210 182   BMET Recent Labs    07/05/20 1554 07/06/20 0538  NA 138 136  K 3.8 3.6  CL 103 106  CO2 26 23  GLUCOSE 121* 109*  BUN 9 10  CREATININE 0.71 0.65  CALCIUM 9.2 8.3*   PT/INR No results for input(s): LABPROT, INR in the last 72 hours.  Studies/Results: CT Abdomen Pelvis W Contrast  Result Date: 07/05/2020 CLINICAL DATA:  Right upper and right lower quadrant abdominal pain, vomiting EXAM: CT ABDOMEN AND PELVIS WITH CONTRAST TECHNIQUE: Multidetector CT imaging of the abdomen and pelvis was performed using the standard protocol following bolus administration of intravenous contrast. CONTRAST:  189mL OMNIPAQUE IOHEXOL 300 MG/ML  SOLN COMPARISON:  05/13/2020 FINDINGS: Lower chest: No acute pleural or parenchymal lung disease. Bibasilar hypoventilatory changes. Hepatobiliary: Multiple calcified gallstones again identified without cholecystitis. The liver is unremarkable. Pancreas: Stable cystic structure in the pancreatic head measuring 2.8 cm, with associated coarse  calcifications, likely representing residual pseudocyst after previous pancreatitis. There are no acute inflammatory changes. Spleen: Normal in size without focal abnormality. Adrenals/Urinary Tract: Adrenal glands are unremarkable. There are numerous bilateral peripelvic and cortical cysts within the kidneys. Complex peripherally calcified cyst upper pole right kidney measures 2 cm. These have been previously characterized by MRI and shown to demonstrate benign characteristics. The bladder is unremarkable. Stomach/Bowel: No bowel obstruction or ileus. Diverticulosis of the descending and sigmoid colon without diverticulitis. Normal appendix in the right lower quadrant. There is a small amount of free fluid in the right upper quadrant adjacent to the gastric antrum/pylorus. A small tract containing a punctate focus of gas extends from the gastric wall toward the porta hepatis, reference image 26 series 2 and image 34 series 5. Differential diagnosis would be postoperative/post inflammatory fistula versus Recurrent ulceration. No evidence of abscess. Vascular/Lymphatic: No significant vascular findings are present. No enlarged abdominal or pelvic lymph nodes. Reproductive: Enlarged lobular uterus consistent with multiple fibroids, stable. No adnexal masses. Other: Trace free fluid and punctate focus of extraluminal gas in the right upper quadrant as above. No evidence of abscess. Postsurgical changes from previous midline laparotomy. No abdominal wall hernia. Musculoskeletal: No acute or destructive bony lesions. Reconstructed images demonstrate no additional findings. IMPRESSION: 1. Trace free fluid in the right upper quadrant surrounding the gastric antrum/pylorus, with a small amount of gas tracking into the adjacent sub hepatic space. This is in the region of previous gastric ulcer repair and postoperative fluid collection. Differential diagnosis would include postoperative/post inflammatory fistula versus  Recurrent ulceration. Endoscopy may be useful for  further evaluation. 2. Cholelithiasis with no evidence of cholecystitis. 3. Stable cyst in the head of the pancreas, consistent with sequela of previous pancreatitis. 4. Stable bilateral renal cysts. 5. Diverticulosis without diverticulitis. 6. Fibroid uterus. These results were called by telephone at the time of interpretation on 07/05/2020 at 9:51pm to provider DR LONG, who verbally acknowledged these results. Electronically Signed   By: Randa Ngo M.D.   On: 07/05/2020 21:56    Anti-infectives: Anti-infectives (From admission, onward)   Start     Dose/Rate Route Frequency Ordered Stop   07/06/20 1030  piperacillin-tazobactam (ZOSYN) IVPB 3.375 g  Status:  Discontinued        3.375 g 12.5 mL/hr over 240 Minutes Intravenous Every 8 hours 07/06/20 1020 07/06/20 1653   07/05/20 2330  piperacillin-tazobactam (ZOSYN) IVPB 3.375 g        3.375 g 100 mL/hr over 30 Minutes Intravenous  Once 07/05/20 2325 07/06/20 0020      Assessment/Plan: Impression: Epigastric abdominal pain.  Seems to be improved.  This could be the result of gastritis or residual peptic ulcer disease.  This is being treated empirically.  She does have cholelithiasis, thus will watch for biliary colic, although this seems atypical.  Liver function tests were all within normal limits. Plan: Will advance to soft diet.  LOS: 1 day    Aviva Signs 07/07/2020

## 2020-07-07 NOTE — Progress Notes (Signed)
Subjective: Patient doing well this morning, tired.  Does note 1 episode of epigastric pain though overall doing much better.  No nausea or vomiting.  Tolerating full liquid diet.  Objective: Vital signs in last 24 hours: Temp:  [98.4 F (36.9 C)-99.7 F (37.6 C)] 99.7 F (37.6 C) (11/14 0500) Pulse Rate:  [50-79] 51 (11/14 0500) Resp:  [11-20] 16 (11/14 0500) BP: (119-151)/(62-74) 141/68 (11/14 0500) SpO2:  [93 %-98 %] 97 % (11/14 0500) Weight:  [85.5 kg-86.3 kg] 85.5 kg (11/14 0500) Last BM Date: 07/05/20 General:   Alert and oriented, pleasant Abdomen:  Bowel sounds present, soft, non-tender, non-distended. No HSM or hernias noted. No rebound or guarding. No masses appreciated  Msk:  Symmetrical without gross deformities. Normal posture. Pulses:  Normal pulses noted. Extremities:  Without clubbing or edema. Neurologic:  Alert and  oriented x4;  grossly normal neurologically. Skin:  Warm and dry, intact without significant lesions.  Cervical Nodes:  No significant cervical adenopathy. Psych:  Alert and cooperative. Normal mood and affect.  Intake/Output from previous day: 11/13 0701 - 11/14 0700 In: 475.2 [I.V.:375.2; IV Piggyback:100] Out: -  Intake/Output this shift: Total I/O In: 240 [P.O.:240] Out: -   Lab Results: Recent Labs    07/05/20 1554 07/06/20 0538  WBC 6.6 7.7  HGB 12.1 11.1*  HCT 37.4 35.6*  PLT 210 182   BMET Recent Labs    07/05/20 1554 07/06/20 0538  NA 138 136  K 3.8 3.6  CL 103 106  CO2 26 23  GLUCOSE 121* 109*  BUN 9 10  CREATININE 0.71 0.65  CALCIUM 9.2 8.3*   LFT Recent Labs    07/05/20 1554 07/06/20 0538  PROT 7.3 6.2*  ALBUMIN 4.2 3.6  AST 17 17  ALT 10 10  ALKPHOS 54 47  BILITOT 0.7 0.8   PT/INR No results for input(s): LABPROT, INR in the last 72 hours. Hepatitis Panel No results for input(s): HEPBSAG, HCVAB, HEPAIGM, HEPBIGM in the last 72 hours.   Studies/Results: CT Abdomen Pelvis W Contrast  Result Date:  07/05/2020 CLINICAL DATA:  Right upper and right lower quadrant abdominal pain, vomiting EXAM: CT ABDOMEN AND PELVIS WITH CONTRAST TECHNIQUE: Multidetector CT imaging of the abdomen and pelvis was performed using the standard protocol following bolus administration of intravenous contrast. CONTRAST:  153mL OMNIPAQUE IOHEXOL 300 MG/ML  SOLN COMPARISON:  05/13/2020 FINDINGS: Lower chest: No acute pleural or parenchymal lung disease. Bibasilar hypoventilatory changes. Hepatobiliary: Multiple calcified gallstones again identified without cholecystitis. The liver is unremarkable. Pancreas: Stable cystic structure in the pancreatic head measuring 2.8 cm, with associated coarse calcifications, likely representing residual pseudocyst after previous pancreatitis. There are no acute inflammatory changes. Spleen: Normal in size without focal abnormality. Adrenals/Urinary Tract: Adrenal glands are unremarkable. There are numerous bilateral peripelvic and cortical cysts within the kidneys. Complex peripherally calcified cyst upper pole right kidney measures 2 cm. These have been previously characterized by MRI and shown to demonstrate benign characteristics. The bladder is unremarkable. Stomach/Bowel: No bowel obstruction or ileus. Diverticulosis of the descending and sigmoid colon without diverticulitis. Normal appendix in the right lower quadrant. There is a small amount of free fluid in the right upper quadrant adjacent to the gastric antrum/pylorus. A small tract containing a punctate focus of gas extends from the gastric wall toward the porta hepatis, reference image 26 series 2 and image 34 series 5. Differential diagnosis would be postoperative/post inflammatory fistula versus Recurrent ulceration. No evidence of abscess. Vascular/Lymphatic: No significant vascular findings  are present. No enlarged abdominal or pelvic lymph nodes. Reproductive: Enlarged lobular uterus consistent with multiple fibroids, stable. No adnexal  masses. Other: Trace free fluid and punctate focus of extraluminal gas in the right upper quadrant as above. No evidence of abscess. Postsurgical changes from previous midline laparotomy. No abdominal wall hernia. Musculoskeletal: No acute or destructive bony lesions. Reconstructed images demonstrate no additional findings. IMPRESSION: 1. Trace free fluid in the right upper quadrant surrounding the gastric antrum/pylorus, with a small amount of gas tracking into the adjacent sub hepatic space. This is in the region of previous gastric ulcer repair and postoperative fluid collection. Differential diagnosis would include postoperative/post inflammatory fistula versus Recurrent ulceration. Endoscopy may be useful for further evaluation. 2. Cholelithiasis with no evidence of cholecystitis. 3. Stable cyst in the head of the pancreas, consistent with sequela of previous pancreatitis. 4. Stable bilateral renal cysts. 5. Diverticulosis without diverticulitis. 6. Fibroid uterus. These results were called by telephone at the time of interpretation on 07/05/2020 at 9:51pm to provider DR LONG, who verbally acknowledged these results. Electronically Signed   By: Randa Ngo M.D.   On: 07/05/2020 21:56    Assessment: *Abdominal pain *Nausea vomiting *History of peptic ulcer disease with perforation status post surgical repair *Cholelithiasis without cholecystitis  Plan: Patient continues to clinically improve, agree with advancing her diet today.  No indication for EGD at this juncture.  Recommend continued IV PPI while inpatient and switching to p.o. once stable for discharge.  GI to continue to follow along.  Elon Alas. Abbey Chatters, D.O. Gastroenterology and Hepatology [facility]   LOS: 1 day    07/07/2020, 12:12 PM

## 2020-07-07 NOTE — Progress Notes (Signed)
PROGRESS NOTE    Maria Murphy  MEQ:[id number] DOB: 05/03/1956 DOA: 07/05/2020 PCP: Patient, No Pcp Per    Brief Narrative:  64 year old female with recent hospitalization and complicated course involving perforated gastric ulcer treated with operative management subsequent development of abdominal abscess which required drain placement.  Hospital course was further complicated by development of saddle pulmonary embolus and she was started on anticoagulation.  Patient presents to the hospital with abdominal pain, nausea and vomiting.  CT showed possible inflammatory fistula versus recurrent ulcer.  GI and general surgery following.  Felt her symptoms may be related to gastritis versus residual peptic ulcer disease.  Clinically she appears to be stable.  Advancing diet as tolerated with symptom management   Assessment & Plan:   Active Problems:   Peptic ulcer with perforation ([place])   Acute saddle pulmonary embolism without acute cor pulmonale (H[facility])   Abdominal pain   Abdominal pain -Etiology is unclear -CT of the abdomen shows possible inflammatory fistula versus recurrent ulcer -GI and general surgery following -Symptoms may be related to gastritis versus residual peptic ulcer disease -She is on PPI, will add Carafate -Advance diet as tolerated, no plans on endoscopic evaluation at this time -Continue supportive management with antiemetics and pain medicine -No leukocytosis or fever at this time.   Pulmonary embolism -On previous hospitalization, patient was diagnosed with saddle pulmonary embolism -She had been discharged on Xarelto -Patient reports that she had only taken Xarelto for a month and has not taken any anticoagulants in approximately 2 weeks -She was restarted on Xarelto and will likely need a minimum of 6 months of therapy -She is not short of breath at this time  Hypertension -Blood pressures currently stable -Resume oral medications when she is no longer  n.p.o.   DVT prophylaxis: SCDs Start: 07/06/20 0519 rivaroxaban (XARELTO) tablet 20 mg  Code Status: Full code Family Communication: Discussed with patient Disposition Plan: Status is: Inpatient  Remains inpatient appropriate because:IV treatments appropriate due to intensity of illness or inability to take PO   Dispo: The patient is from: Home              Anticipated d/c is to: Home              Anticipated d/c date is: 1 day              Patient currently is not medically stable to d/c.    Consultants:   General surgery  Gastroenterology  Procedures:     Antimicrobials:       Subjective: No diarrhea.  Reports that she is having trouble tolerating p.o. intake and has been spitting up what ever she tries to eat.  Reports that abdominal pain gets worse after trying to eat.  Objective: Vitals:   07/06/20 2259 07/06/20 2300 07/07/20 0500 07/07/20 1248  BP: (!) 151/66  (!) 141/68 (!) 125/55  Pulse: (!) 57  (!) 51 (!) 55  Resp: 16  16 18   Temp: 99 F (37.2 C)  99.7 F (37.6 C) 98.9 F (37.2 C)  TempSrc: Oral  Oral Oral  SpO2: 97%  97% 97%  Weight:  86.3 kg 85.5 kg   Height:  5\' 4"  (1.626 m)      Intake/Output Summary (Last 24 hours) at 07/07/2020 1732 Last data filed at 07/07/2020 1627 Gross per 24 hour  Intake 480 ml  Output --  Net 480 ml   Filed Weights   07/05/20 1458 07/06/20 2300 07/07/20 0500  Weight:  86.2 kg 86.3 kg 85.5 kg    Examination:  General exam: Appears calm and comfortable  Respiratory system: Clear to auscultation. Respiratory effort normal. Cardiovascular system: S1 & S2 heard, RRR. No JVD, murmurs, rubs, gallops or clicks. No pedal edema. Gastrointestinal system: Abdomen is nondistended, soft , tender on right abdomen and epigastrium.. No organomegaly or masses felt. Normal bowel sounds heard. Central nervous system: Alert and oriented. No focal neurological deficits. Extremities: Symmetric 5 x 5 power. Skin: No rashes, lesions  or ulcers Psychiatry: Judgement and insight appear normal. Mood & affect appropriate.     Data Reviewed: I have personally reviewed following labs and imaging studies  CBC: Recent Labs  Lab 07/05/20 1554 07/06/20 0538  WBC 6.6 7.7  NEUTROABS  --  6.2  HGB 12.1 11.1*  HCT 37.4 35.6*  MCV 89.9 93.2  PLT 210 509   Basic Metabolic Panel: Recent Labs  Lab 07/05/20 1554 07/06/20 0538  NA 138 136  K 3.8 3.6  CL 103 106  CO2 26 23  GLUCOSE 121* 109*  BUN 9 10  CREATININE 0.71 0.65  CALCIUM 9.2 8.3*  MG  --  1.8   GFR: Estimated Creatinine Clearance: 76.1 mL/min (by C-G formula based on SCr of 0.65 mg/dL). Liver Function Tests: Recent Labs  Lab 07/05/20 1554 07/06/20 0538  AST 17 17  ALT 10 10  ALKPHOS 54 47  BILITOT 0.7 0.8  PROT 7.3 6.2*  ALBUMIN 4.2 3.6   Recent Labs  Lab 07/05/20 1554  LIPASE 30   No results for input(s): AMMONIA in the last 168 hours. Coagulation Profile: No results for input(s): INR, PROTIME in the last 168 hours. Cardiac Enzymes: No results for input(s): CKTOTAL, CKMB, CKMBINDEX, TROPONINI in the last 168 hours. BNP (last 3 results) No results for input(s): PROBNP in the last 8760 hours. HbA1C: No results for input(s): HGBA1C in the last 72 hours. CBG: No results for input(s): GLUCAP in the last 168 hours. Lipid Profile: No results for input(s): CHOL, HDL, LDLCALC, TRIG, CHOLHDL, LDLDIRECT in the last 72 hours. Thyroid Function Tests: Recent Labs    07/06/20 0105  TSH 0.690   Anemia Panel: No results for input(s): VITAMINB12, FOLATE, FERRITIN, TIBC, IRON, RETICCTPCT in the last 72 hours. Sepsis Labs: No results for input(s): PROCALCITON, LATICACIDVEN in the last 168 hours.  Recent Results (from the past 240 hour(s))  Respiratory Panel by RT PCR (Flu A&B, Covid) - Nasopharyngeal Swab     Status: None   Collection Time: 07/05/20 11:27 PM   Specimen: Nasopharyngeal Swab  Result Value Ref Range Status   SARS Coronavirus 2 by  RT PCR NEGATIVE NEGATIVE Final   Influenza A by PCR NEGATIVE NEGATIVE Final   Influenza B by PCR NEGATIVE NEGATIVE Final    Comment: Performed at [facility], [address], [place], [place] [address]  Culture, blood (routine x 2)     Status: None (Preliminary result)   Collection Time: 07/06/20 12:59 AM   Specimen: BLOOD LEFT HAND  Result Value Ref Range Status   Specimen Description BLOOD LEFT HAND  Final   Special Requests   Final    BOTTLES DRAWN AEROBIC AND ANAEROBIC Blood Culture adequate volume   Culture   Final    NO GROWTH < 12 HOURS Performed at [facility], [address], [place], [place] [address]    Report Status PENDING  Incomplete  Culture, blood (routine x 2)     Status: None (Preliminary result)  Collection Time: 07/06/20  1:05 AM   Specimen: Right Antecubital; Blood  Result Value Ref Range Status   Specimen Description RIGHT ANTECUBITAL  Final   Special Requests   Final    BOTTLES DRAWN AEROBIC AND ANAEROBIC Blood Culture adequate volume   Culture   Final    NO GROWTH < 12 HOURS Performed at [facility], [address], [place], [place] [address]    Report Status PENDING  Incomplete         Radiology Studies: CT Abdomen Pelvis W Contrast  Result Date: 07/05/2020 CLINICAL DATA:  Right upper and right lower quadrant abdominal pain, vomiting EXAM: CT ABDOMEN AND PELVIS WITH CONTRAST TECHNIQUE: Multidetector CT imaging of the abdomen and pelvis was performed using the standard protocol following bolus administration of intravenous contrast. CONTRAST:  137mL OMNIPAQUE IOHEXOL 300 MG/ML  SOLN COMPARISON:  05/13/2020 FINDINGS: Lower chest: No acute pleural or parenchymal lung disease. Bibasilar hypoventilatory changes. Hepatobiliary: Multiple calcified gallstones again identified without cholecystitis. The liver is unremarkable. Pancreas: Stable cystic structure in the pancreatic head measuring 2.8 cm, with associated coarse calcifications, likely representing  residual pseudocyst after previous pancreatitis. There are no acute inflammatory changes. Spleen: Normal in size without focal abnormality. Adrenals/Urinary Tract: Adrenal glands are unremarkable. There are numerous bilateral peripelvic and cortical cysts within the kidneys. Complex peripherally calcified cyst upper pole right kidney measures 2 cm. These have been previously characterized by MRI and shown to demonstrate benign characteristics. The bladder is unremarkable. Stomach/Bowel: No bowel obstruction or ileus. Diverticulosis of the descending and sigmoid colon without diverticulitis. Normal appendix in the right lower quadrant. There is a small amount of free fluid in the right upper quadrant adjacent to the gastric antrum/pylorus. A small tract containing a punctate focus of gas extends from the gastric wall toward the porta hepatis, reference image 26 series 2 and image 34 series 5. Differential diagnosis would be postoperative/post inflammatory fistula versus Recurrent ulceration. No evidence of abscess. Vascular/Lymphatic: No significant vascular findings are present. No enlarged abdominal or pelvic lymph nodes. Reproductive: Enlarged lobular uterus consistent with multiple fibroids, stable. No adnexal masses. Other: Trace free fluid and punctate focus of extraluminal gas in the right upper quadrant as above. No evidence of abscess. Postsurgical changes from previous midline laparotomy. No abdominal wall hernia. Musculoskeletal: No acute or destructive bony lesions. Reconstructed images demonstrate no additional findings. IMPRESSION: 1. Trace free fluid in the right upper quadrant surrounding the gastric antrum/pylorus, with a small amount of gas tracking into the adjacent sub hepatic space. This is in the region of previous gastric ulcer repair and postoperative fluid collection. Differential diagnosis would include postoperative/post inflammatory fistula versus Recurrent ulceration. Endoscopy may be  useful for further evaluation. 2. Cholelithiasis with no evidence of cholecystitis. 3. Stable cyst in the head of the pancreas, consistent with sequela of previous pancreatitis. 4. Stable bilateral renal cysts. 5. Diverticulosis without diverticulitis. 6. Fibroid uterus. These results were called by telephone at the time of interpretation on 07/05/2020 at 9:51pm to provider DR LONG, who verbally acknowledged these results. Electronically Signed   By: Randa Ngo M.D.   On: 07/05/2020 21:56        Scheduled Meds: . ondansetron (ZOFRAN) IV  4 mg Intravenous Q6H  . pantoprazole (PROTONIX) IV  40 mg Intravenous Q12H  . potassium chloride  40 mEq Oral Once  . rivaroxaban  20 mg Oral Q supper  . sucralfate  1 g Oral TID WC & HS   Continuous Infusions:  LOS: 1 day    Time spent: 38mins    Kathie Dike, MD [facility]   If 7PM-7AM, please contact night-coverage [email or web address]  07/07/2020, 5:32 PM

## 2020-07-07 NOTE — Plan of Care (Signed)
  Problem: Education: Goal: Knowledge of General Education information will improve Description Including pain rating scale, medication(s)/side effects and non-pharmacologic comfort measures Outcome: Progressing   Problem: Health Behavior/Discharge Planning: Goal: Ability to manage health-related needs will improve Outcome: Progressing   

## 2020-07-08 DIAGNOSIS — K275 Chronic or unspecified peptic ulcer, site unspecified, with perforation: Secondary | ICD-10-CM | POA: Diagnosis not present

## 2020-07-08 DIAGNOSIS — R1013 Epigastric pain: Secondary | ICD-10-CM | POA: Diagnosis not present

## 2020-07-08 DIAGNOSIS — I2692 Saddle embolus of pulmonary artery without acute cor pulmonale: Secondary | ICD-10-CM | POA: Diagnosis not present

## 2020-07-08 MED ORDER — PANTOPRAZOLE SODIUM 40 MG PO TBEC: 40.0000 mg | DELAYED_RELEASE_TABLET | Freq: Two times a day (BID) | ORAL | Status: DC

## 2020-07-08 MED ORDER — PANTOPRAZOLE SODIUM 40 MG PO TBEC
40.0000 mg | DELAYED_RELEASE_TABLET | Freq: Two times a day (BID) | ORAL | Status: DC
  Administered 2020-07-08 – 2020-07-09 (×3): 40 mg via ORAL
  Filled 2020-07-08 (×3): qty 1

## 2020-07-08 NOTE — Progress Notes (Signed)
PROGRESS NOTE    Maria Murphy  VOJ:[id number] DOB: 04-05-1956 DOA: 07/05/2020 PCP: Patient, No Pcp Per    Brief Narrative:  64 year old female with recent hospitalization and complicated course involving perforated gastric ulcer treated with operative management subsequent development of abdominal abscess which required drain placement.  Hospital course was further complicated by development of saddle pulmonary embolus and she was started on anticoagulation.  Patient presents to the hospital with abdominal pain, nausea and vomiting.  CT showed possible inflammatory fistula versus recurrent ulcer.  GI and general surgery following.  Felt her symptoms may be related to gastritis versus residual peptic ulcer disease.  Clinically she appears to be stable.  Advancing diet as tolerated with symptom management   Assessment & Plan:   Active Problems:   Peptic ulcer with perforation ([place])   Acute saddle pulmonary embolism without acute cor pulmonale (HCC)   Abdominal pain   Abdominal pain with vomiting -Etiology is unclear -CT of the abdomen shows possible inflammatory fistula versus recurrent ulcer -GI and general surgery following -Symptoms may be related to gastritis versus residual peptic ulcer disease -She is on PPI and Carafate -Patient does admit to taking ibuprofen 3 times daily -Question if he would benefit from endoscopic evaluation to evaluate for any developing peptic stricture or persistent PUD -Diet has been deescalated to full liquids -Continue supportive management with antiemetics and pain medicine -No leukocytosis or fever at this time.   Pulmonary embolism -On previous hospitalization, patient was diagnosed with saddle pulmonary embolism -She had been discharged on Xarelto -Patient reports that she had only taken Xarelto for a month and has not taken any anticoagulants in approximately 2 weeks -She was restarted on Xarelto and will likely need a minimum of 6 months  of therapy -She is not short of breath at this time  Hypertension -Blood pressures currently stable -Resume oral medications when she is no longer n.p.o.   DVT prophylaxis: SCDs Start: 07/06/20 0519 rivaroxaban (XARELTO) tablet 20 mg  Code Status: Full code Family Communication: Discussed with patient Disposition Plan: Status is: Inpatient  Remains inpatient appropriate because:IV treatments appropriate due to intensity of illness or inability to take PO   Dispo: The patient is from: Home              Anticipated d/c is to: Home              Anticipated d/c date is: 1 day              Patient currently is not medically stable to d/c.    Consultants:   General surgery  Gastroenterology  Procedures:     Antimicrobials:       Subjective: Reports continued abdominal pain and vomiting after eating.  Reports that pain is better after vomiting.  She also admits to using ibuprofen 3 times daily for the past few weeks.  Objective: Vitals:   07/07/20 1248 07/07/20 2113 07/08/20 0649 07/08/20 1445  BP: (!) 125/55 (!) 131/57 125/66 122/88  Pulse: (!) 55 (!) 58 (!) 53 (!) 58  Resp: 18 20 20 20   Temp: 98.9 F (37.2 C) 99.1 F (37.3 C) 98 F (36.7 C) 98.1 F (36.7 C)  TempSrc: Oral Oral Oral Oral  SpO2: 97% 98% 98% 95%  Weight:      Height:        Intake/Output Summary (Last 24 hours) at 07/08/2020 1901 Last data filed at 07/08/2020 1415 Gross per 24 hour  Intake 960 ml  Output --  Net 960 ml   Filed Weights   07/05/20 1458 07/06/20 2300 07/07/20 0500  Weight: 86.2 kg 86.3 kg 85.5 kg    Examination:  General exam: Appears calm and comfortable  Respiratory system: Clear to auscultation. Respiratory effort normal. Cardiovascular system: S1 & S2 heard, RRR. No JVD, murmurs, rubs, gallops or clicks. No pedal edema. Gastrointestinal system: Abdomen is nondistended, soft , tender on right abdomen and epigastrium.. No organomegaly or masses felt. Normal bowel  sounds heard. Central nervous system: Alert and oriented. No focal neurological deficits. Extremities: Symmetric 5 x 5 power. Skin: No rashes, lesions or ulcers Psychiatry: Judgement and insight appear normal. Mood & affect appropriate.     Data Reviewed: I have personally reviewed following labs and imaging studies  CBC: Recent Labs  Lab 07/05/20 1554 07/06/20 0538  WBC 6.6 7.7  NEUTROABS  --  6.2  HGB 12.1 11.1*  HCT 37.4 35.6*  MCV 89.9 93.2  PLT 210 938   Basic Metabolic Panel: Recent Labs  Lab 07/05/20 1554 07/06/20 0538  NA 138 136  K 3.8 3.6  CL 103 106  CO2 26 23  GLUCOSE 121* 109*  BUN 9 10  CREATININE 0.71 0.65  CALCIUM 9.2 8.3*  MG  --  1.8   GFR: Estimated Creatinine Clearance: 76.1 mL/min (by C-G formula based on SCr of 0.65 mg/dL). Liver Function Tests: Recent Labs  Lab 07/05/20 1554 07/06/20 0538  AST 17 17  ALT 10 10  ALKPHOS 54 47  BILITOT 0.7 0.8  PROT 7.3 6.2*  ALBUMIN 4.2 3.6   Recent Labs  Lab 07/05/20 1554  LIPASE 30   No results for input(s): AMMONIA in the last 168 hours. Coagulation Profile: No results for input(s): INR, PROTIME in the last 168 hours. Cardiac Enzymes: No results for input(s): CKTOTAL, CKMB, CKMBINDEX, TROPONINI in the last 168 hours. BNP (last 3 results) No results for input(s): PROBNP in the last 8760 hours. HbA1C: No results for input(s): HGBA1C in the last 72 hours. CBG: No results for input(s): GLUCAP in the last 168 hours. Lipid Profile: No results for input(s): CHOL, HDL, LDLCALC, TRIG, CHOLHDL, LDLDIRECT in the last 72 hours. Thyroid Function Tests: Recent Labs    07/06/20 0105  TSH 0.690   Anemia Panel: No results for input(s): VITAMINB12, FOLATE, FERRITIN, TIBC, IRON, RETICCTPCT in the last 72 hours. Sepsis Labs: No results for input(s): PROCALCITON, LATICACIDVEN in the last 168 hours.  Recent Results (from the past 240 hour(s))  Respiratory Panel by RT PCR (Flu A&B, Covid) -  Nasopharyngeal Swab     Status: None   Collection Time: 07/05/20 11:27 PM   Specimen: Nasopharyngeal Swab  Result Value Ref Range Status   SARS Coronavirus 2 by RT PCR NEGATIVE NEGATIVE Final   Influenza A by PCR NEGATIVE NEGATIVE Final   Influenza B by PCR NEGATIVE NEGATIVE Final    Comment: Performed at [facility], [address] [place], [place] [address]  Culture, blood (routine x 2)     Status: None (Preliminary result)   Collection Time: 07/06/20 12:59 AM   Specimen: BLOOD LEFT HAND  Result Value Ref Range Status   Specimen Description BLOOD LEFT HAND  Final   Special Requests   Final    BOTTLES DRAWN AEROBIC AND ANAEROBIC Blood Culture adequate volume   Culture   Final    NO GROWTH 2 DAYS Performed at [facility], [address] [place], [place] [address]    Report Status PENDING  Incomplete  Culture, blood (routine x 2)     Status: None (Preliminary result)   Collection Time: 07/06/20  1:05 AM   Specimen: Right Antecubital; Blood  Result Value Ref Range Status   Specimen Description RIGHT ANTECUBITAL  Final   Special Requests   Final    BOTTLES DRAWN AEROBIC AND ANAEROBIC Blood Culture adequate volume   Culture   Final    NO GROWTH 2 DAYS Performed at [facility], [address] [place], [place] [address]    Report Status PENDING  Incomplete         Radiology Studies: No results found.      Scheduled Meds: . ondansetron (ZOFRAN) IV  4 mg Intravenous Q6H  . pantoprazole  40 mg Oral BID AC  . rivaroxaban  20 mg Oral Q supper  . sucralfate  1 g Oral TID WC & HS   Continuous Infusions:   LOS: 2 days    Time spent: 61mins    Kathie Dike, MD [facility]   If 7PM-7AM, please contact night-coverage [email or web address]  07/08/2020, 7:01 PM

## 2020-07-08 NOTE — Progress Notes (Signed)
    Subjective: Vomiting with breakfast. Had cottage cheese and pear yesterday evening, which "stayed in my stomach longer" but then vomited about 1/2 an hour later. Notes sharp pain after eating in RUQ, then vomits. States vomiting has persisted while inpatient. [place] last Tuesday and hit her head. States hurts on right side of head. Wonders if this is related to the N/V. Feels dizzy when standing up. No nausea or pain unless she is eating.   Objective: Vital signs in last 24 hours: Temp:  [98 F (36.7 C)-99.1 F (37.3 C)] 98 F (36.7 C) (11/15 0649) Pulse Rate:  [53-58] 53 (11/15 0649) Resp:  [18-20] 20 (11/15 0649) BP: (125-131)/(55-66) 125/66 (11/15 0649) SpO2:  [97 %-98 %] 98 % (11/15 0649) Last BM Date: 07/05/20 General:   Alert and oriented, pleasant Head:  Normocephalic and atraumatic. Eyes:  No icterus, sclera clear. Conjuctiva pink.  Abdomen:  Bowel sounds present, soft, non-tender, non-distended. No HSM or hernias noted. No rebound or guarding. No masses appreciated  Msk:  Symmetrical without gross deformities. Normal posture. Neurologic:  Alert and  oriented x4 Psych:  Alert and cooperative. Normal mood and affect.  Intake/Output from previous day: 11/14 0701 - 11/15 0700 In: 720 [P.O.:720] Out: -  Intake/Output this shift: No intake/output data recorded.  Lab Results: Recent Labs    07/05/20 1554 07/06/20 0538  WBC 6.6 7.7  HGB 12.1 11.1*  HCT 37.4 35.6*  PLT 210 182   BMET Recent Labs    07/05/20 1554 07/06/20 0538  NA 138 136  K 3.8 3.6  CL 103 106  CO2 26 23  GLUCOSE 121* 109*  BUN 9 10  CREATININE 0.71 0.65  CALCIUM 9.2 8.3*   LFT Recent Labs    07/05/20 1554 07/06/20 0538  PROT 7.3 6.2*  ALBUMIN 4.2 3.6  AST 17 17  ALT 10 10  ALKPHOS 54 28  BILITOT 0.7 0.8    Assessment: 64 year old female with history of perforated gastric ulcer in Aug 2021 s/p exploratory laparotomy, complicated by abdominal abscesses and saddle pulmonary  embolus, presenting with abdominal pain, N/V, and CT with questionable fistulous formation vs ulceration. Findings felt to be post-surgical.  Nausea/vomiting, and RUQ remains persistent postprandially, occurring within 30 minutes of eating. Discussed with Dr. Roderic Palau today. We have backed her down to full liquids. Query etiology as PUD, ?stricture, delayed gastric emptying although would not typically have pain with this, doubt biliary. Will need to discuss utility of EGD at this point as she continues to have symptoms.   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    Plan: Continue PPI BID and change timing to before meals Continue Carafate Decrease diet to fulls for now Continue scheduled Zofran Further recommendations to follow   Annitta Needs, PhD, ANP-BC [facility]     LOS: 2 days    07/08/2020, 10:02 AM

## 2020-07-08 NOTE — Progress Notes (Signed)
  Subjective: Mild epigastric pain, but much improved from before hospitalization.  No emesis noted.  Objective: Vital signs in last 24 hours: Temp:  [98 F (36.7 C)-99.1 F (37.3 C)] 98 F (36.7 C) (11/15 0649) Pulse Rate:  [53-58] 53 (11/15 0649) Resp:  [18-20] 20 (11/15 0649) BP: (125-131)/(55-66) 125/66 (11/15 0649) SpO2:  [97 %-98 %] 98 % (11/15 0649) Last BM Date: 07/05/20  Intake/Output from previous day: 11/14 0701 - 11/15 0700 In: 720 [P.O.:720] Out: -  Intake/Output this shift: No intake/output data recorded.  General appearance: alert, cooperative and no distress GI: soft, non-tender; bowel sounds normal; no masses,  no organomegaly  Lab Results:  Recent Labs    07/05/20 1554 07/06/20 0538  WBC 6.6 7.7  HGB 12.1 11.1*  HCT 37.4 35.6*  PLT 210 182   BMET Recent Labs    07/05/20 1554 07/06/20 0538  NA 138 136  K 3.8 3.6  CL 103 106  CO2 26 23  GLUCOSE 121* 109*  BUN 9 10  CREATININE 0.71 0.65  CALCIUM 9.2 8.3*   PT/INR No results for input(s): LABPROT, INR in the last 72 hours.  Studies/Results: No results found.  Anti-infectives: Anti-infectives (From admission, onward)   Start     Dose/Rate Route Frequency Ordered Stop   07/06/20 1030  piperacillin-tazobactam (ZOSYN) IVPB 3.375 g  Status:  Discontinued        3.375 g 12.5 mL/hr over 240 Minutes Intravenous Every 8 hours 07/06/20 1020 07/06/20 1653   07/05/20 2330  piperacillin-tazobactam (ZOSYN) IVPB 3.375 g        3.375 g 100 mL/hr over 30 Minutes Intravenous  Once 07/05/20 2325 07/06/20 0020      Assessment/Plan: Impression: Epigastric pain resolving.  Most likely secondary to gastritis/peptic ulcer disease.  Agree with starting Carafate.  No need for surgical intervention at this time.  LOS: 2 days    Aviva Signs 07/08/2020

## 2020-07-09 DIAGNOSIS — R1011 Right upper quadrant pain: Secondary | ICD-10-CM | POA: Diagnosis not present

## 2020-07-09 DIAGNOSIS — R1110 Vomiting, unspecified: Secondary | ICD-10-CM

## 2020-07-09 DIAGNOSIS — R112 Nausea with vomiting, unspecified: Secondary | ICD-10-CM | POA: Diagnosis not present

## 2020-07-09 LAB — CBC WITH DIFFERENTIAL/PLATELET
Abs Immature Granulocytes: 0.01 10*3/uL (ref 0.00–0.07)
Basophils Absolute: 0 10*3/uL (ref 0.0–0.1)
Basophils Relative: 1 %
Eosinophils Absolute: 0.1 10*3/uL (ref 0.0–0.5)
Eosinophils Relative: 2 %
HCT: 34.3 % — ABNORMAL LOW (ref 36.0–46.0)
Hemoglobin: 11.1 g/dL — ABNORMAL LOW (ref 12.0–15.0)
Immature Granulocytes: 0 %
Lymphocytes Relative: 28 %
Lymphs Abs: 1.1 10*3/uL (ref 0.7–4.0)
MCH: 28.9 pg (ref 26.0–34.0)
MCHC: 32.4 g/dL (ref 30.0–36.0)
MCV: 89.3 fL (ref 80.0–100.0)
Monocytes Absolute: 0.4 10*3/uL (ref 0.1–1.0)
Monocytes Relative: 9 %
Neutro Abs: 2.3 10*3/uL (ref 1.7–7.7)
Neutrophils Relative %: 60 %
Platelets: 199 10*3/uL (ref 150–400)
RBC: 3.84 MIL/uL — ABNORMAL LOW (ref 3.87–5.11)
RDW: 14.6 % (ref 11.5–15.5)
WBC: 3.8 10*3/uL — ABNORMAL LOW (ref 4.0–10.5)
nRBC: 0 % (ref 0.0–0.2)

## 2020-07-09 LAB — COMPREHENSIVE METABOLIC PANEL
ALT: 10 U/L (ref 0–44)
AST: 21 U/L (ref 15–41)
Albumin: 3.7 g/dL (ref 3.5–5.0)
Alkaline Phosphatase: 54 U/L (ref 38–126)
Anion gap: 8 (ref 5–15)
BUN: 8 mg/dL (ref 8–23)
CO2: 25 mmol/L (ref 22–32)
Calcium: 8.9 mg/dL (ref 8.9–10.3)
Chloride: 104 mmol/L (ref 98–111)
Creatinine, Ser: 0.76 mg/dL (ref 0.44–1.00)
GFR, Estimated: >60 mL/min (ref >60)
Glucose, Bld: 109 mg/dL — ABNORMAL HIGH (ref 70–99)
Potassium: 3.6 mmol/L (ref 3.5–5.1)
Sodium: 137 mmol/L (ref 135–145)
Total Bilirubin: 0.5 mg/dL (ref 0.3–1.2)
Total Protein: 6.3 g/dL — ABNORMAL LOW (ref 6.5–8.1)

## 2020-07-09 MED ORDER — SODIUM CHLORIDE 0.9 % IV SOLN: INTRAVENOUS | Status: DC

## 2020-07-09 MED ORDER — DICYCLOMINE HCL 10 MG PO CAPS
10.0000 mg | ORAL_CAPSULE | Freq: Two times a day (BID) | ORAL | Status: DC
  Administered 2020-07-09 – 2020-07-12 (×4): 10 mg via ORAL
  Filled 2020-07-09 (×5): qty 1

## 2020-07-09 NOTE — Progress Notes (Signed)
    Subjective: Continues with postprandial vomiting. States she gets a pain in her RUQ, nausea, and vomits. After she vomits she feels better. No nausea between eating. States she does have mild RUQ abdominal pain that persist between meals. This has been present since admission without significant change. No brbpr or melena.     Objective: Vital signs in last 24 hours: Temp:  [98.1 F (36.7 C)-98.2 F (36.8 C)] 98.2 F (36.8 C) (11/16 0536) Pulse Rate:  [54-58] 54 (11/16 0536) Resp:  [20] 20 (11/16 0536) BP: (122-146)/(71-88) 146/71 (11/16 0536) SpO2:  [95 %-97 %] 96 % (11/16 0536) Last BM Date: 07/07/20 General:   Alert and oriented, pleasant, NAD Head:  Normocephalic and atraumatic. Eyes:  No icterus, sclera clear. Conjuctiva pink.  Abdomen:  Bowel sounds present, soft, non-distended. Moderate TTP in RUQ and right mid abdomen. No HSM or hernias noted. No rebound or guarding. No masses appreciated  Msk:  Symmetrical without gross deformities. Normal posture. Extremities:  Without edema. Neurologic:  Alert and  oriented x4;  grossly normal neurologically. Psych:  Normal mood and affect.  Intake/Output from previous day: 11/15 0701 - 11/16 0700 In: 960 [P.O.:960] Out: -  Intake/Output this shift: No intake/output data recorded.  Assessment: 64 year old female with history of perforated gastric ulcer in Aug 2021 s/p exploratory laparotomy, complicated by abdominal abscesses and saddle pulmonary embolus on Xarelto, presenting with abdominal pain, N/V, and CT with questionable fistulous formation vs ulceration.    CT findings initially felt to be post-surgical. She has continued with RUQ abdominal pain, worsened postprandially with associated nausea and vomiting. She is unable to keep anything other than Ginger ale down, currently on a full liquid diet. As patient is not able to tolerate her diet, I feel she needs EGD for evaluation. Notably, she admits to taking ibuprofen TID due to  knee pain. Only took Protonix BID x 1 month following surgery. Differentials include PUD, structure, delayed gastric emptying.  She does have evidence of cholelithiasis on CT without cholecystitis.  Doubt biliary etiology as symptoms have not changed since admission.  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   Plan: Continue PPI BID Continue scheduled zofran Continue Carafate Back down to clear liquid diet Plan for diagnostic EGD with propofol with Dr. Laural Golden tomorrow on Xarelto. Discussed this with Dr. Jenetta Downer.  NPO at midnight.  Update CBC and CMP She will need to avoid all NSAIDs moving forward.    LOS: 3 days    07/09/2020, 7:38 AM   Aliene Altes, PA-C [facility]

## 2020-07-09 NOTE — Progress Notes (Signed)
  Subjective: Patient states she feels better.  She actually wants to be discharged.  Objective: Vital signs in last 24 hours: Temp:  [98.1 F (36.7 C)-98.2 F (36.8 C)] 98.2 F (36.8 C) (11/16 0536) Pulse Rate:  [54-58] 54 (11/16 0536) Resp:  [20] 20 (11/16 0536) BP: (122-146)/(71-88) 146/71 (11/16 0536) SpO2:  [95 %-97 %] 96 % (11/16 0536) Last BM Date: 07/07/20  Intake/Output from previous day: 11/15 0701 - 11/16 0700 In: 960 [P.O.:960] Out: -  Intake/Output this shift: No intake/output data recorded.  General appearance: alert, cooperative and no distress GI: soft, non-tender; bowel sounds normal; no masses,  no organomegaly  Lab Results:  No results for input(s): WBC, HGB, HCT, PLT in the last 72 hours. BMET No results for input(s): NA, K, CL, CO2, GLUCOSE, BUN, CREATININE, CALCIUM in the last 72 hours. PT/INR No results for input(s): LABPROT, INR in the last 72 hours.  Studies/Results: No results found.  Anti-infectives: Anti-infectives (From admission, onward)   Start     Dose/Rate Route Frequency Ordered Stop   07/06/20 1030  piperacillin-tazobactam (ZOSYN) IVPB 3.375 g  Status:  Discontinued        3.375 g 12.5 mL/hr over 240 Minutes Intravenous Every 8 hours 07/06/20 1020 07/06/20 1653   07/05/20 2330  piperacillin-tazobactam (ZOSYN) IVPB 3.375 g        3.375 g 100 mL/hr over 30 Minutes Intravenous  Once 07/05/20 2325 07/06/20 0020      Assessment/Plan: Impression: Epigastric pain most likely secondary to gastritis and/or flareup of her peptic ulcer disease due to NSAID use.  Patient was told not to take NSAIDs given her history of perforated peptic ulcer disease.  No need for surgical intervention at this time.  LOS: 3 days    Aviva Signs 07/09/2020

## 2020-07-09 NOTE — Progress Notes (Signed)
Patient ambulated approximately 50 feet in hallway with walker with staff. Patient became tired and directed back to room.  Tolerated walk moderately well.

## 2020-07-09 NOTE — Progress Notes (Signed)
PROGRESS NOTE    Maria Murphy  AST:[id number] DOB: 02-Jul-1956 DOA: 07/05/2020 PCP: Patient, No Pcp Per    Brief Narrative:  64 year old female with recent hospitalization and complicated course involving perforated gastric ulcer treated with operative management subsequent development of abdominal abscess which required drain placement.  Hospital course was further complicated by development of saddle pulmonary embolus and she was started on anticoagulation.  Patient presents to the hospital with abdominal pain, nausea and vomiting.  CT showed possible inflammatory fistula versus recurrent ulcer.  GI and general surgery following.  Felt her symptoms may be related to gastritis versus residual peptic ulcer disease.  Clinically she appears to be stable.  Advancing diet as tolerated with symptom management   Assessment & Plan:   Active Problems:   Peptic ulcer with perforation ([place])   Acute saddle pulmonary embolism without acute cor pulmonale (HCC)   Abdominal pain   Non-intractable vomiting   Abdominal pain with vomiting -Etiology is unclear -CT of the abdomen shows possible inflammatory fistula versus recurrent ulcer -GI and general surgery following -Symptoms may be related to gastritis versus residual peptic ulcer disease -She is on PPI and Carafate -Patient does admit to taking ibuprofen 3 times daily for a few weeks prior to admission -Question if he would benefit from endoscopic evaluation to evaluate for any developing peptic stricture or persistent PUD -Seen by GI with plans for endoscopy in a.m. -We will hold further Xarelto, last dose was on 11/15 at 5 PM -Continue supportive management with antiemetics and pain medicine -No leukocytosis or fever at this time.   Pulmonary embolism -On previous hospitalization, patient was diagnosed with saddle pulmonary embolism -She had been discharged on Xarelto -Patient reports that she had only taken Xarelto for a month and has  not taken any anticoagulants in approximately 2 weeks -She was restarted on Xarelto and will likely need a minimum of 6 months of therapy -She is not short of breath at this time  Hypertension -Blood pressures currently stable -Resume oral medications as blood pressure tolerates   DVT prophylaxis: SCDs Start: 07/06/20 0519 rivaroxaban (XARELTO) tablet 20 mg  Code Status: Full code Family Communication: Discussed with patient Disposition Plan: Status is: Inpatient  Remains inpatient appropriate because:IV treatments appropriate due to intensity of illness or inability to take PO   Dispo: The patient is from: Home              Anticipated d/c is to: Home              Anticipated d/c date is: 1 day              Patient currently is not medically stable to d/c.    Consultants:   General surgery  Gastroenterology  Procedures:     Antimicrobials:       Subjective: Reports continued vomiting with any p.o. intake.  Abdominal pain is still present although mildly better.  Objective: Vitals:   07/08/20 2000 07/08/20 2106 07/09/20 0536 07/09/20 1428  BP:  137/71 (!) 146/71 133/73  Pulse:  (!) 56 (!) 54 (!) 55  Resp:  20 20 20   Temp:  98.1 F (36.7 C) 98.2 F (36.8 C) 97.7 F (36.5 C)  TempSrc:  Oral Oral Oral  SpO2: 95% 97% 96% 97%  Weight:      Height:        Intake/Output Summary (Last 24 hours) at 07/09/2020 1804 Last data filed at 07/09/2020 0900 Gross per 24 hour  Intake 480  ml  Output --  Net 480 ml   Filed Weights   07/05/20 1458 07/06/20 2300 07/07/20 0500  Weight: 86.2 kg 86.3 kg 85.5 kg    Examination:  General exam: Alert, awake, oriented x 3 Respiratory system: Clear to auscultation. Respiratory effort normal. Cardiovascular system:RRR. No murmurs, rubs, gallops. Gastrointestinal system: Abdomen is nondistended, soft and tender in right upper quadrant and epigastrium. No organomegaly or masses felt. Normal bowel sounds heard. Central  nervous system: Alert and oriented. No focal neurological deficits. Extremities: No C/C/E, +pedal pulses Skin: No rashes, lesions or ulcers Psychiatry: Judgement and insight appear normal. Mood & affect appropriate.     Data Reviewed: I have personally reviewed following labs and imaging studies  CBC: Recent Labs  Lab 07/05/20 1554 07/06/20 0538 07/09/20 1338  WBC 6.6 7.7 3.8*  NEUTROABS  --  6.2 2.3  HGB 12.1 11.1* 11.1*  HCT 37.4 35.6* 34.3*  MCV 89.9 93.2 89.3  PLT 210 182 003   Basic Metabolic Panel: Recent Labs  Lab 07/05/20 1554 07/06/20 0538 07/09/20 1338  NA 138 136 137  K 3.8 3.6 3.6  CL 103 106 104  CO2 26 23 25   GLUCOSE 121* 109* 109*  BUN 9 10 8   CREATININE 0.71 0.65 0.76  CALCIUM 9.2 8.3* 8.9  MG  --  1.8  --    GFR: Estimated Creatinine Clearance: 76.1 mL/min (by C-G formula based on SCr of 0.76 mg/dL). Liver Function Tests: Recent Labs  Lab 07/05/20 1554 07/06/20 0538 07/09/20 1338  AST 17 17 21   ALT 10 10 10   ALKPHOS 54 47 54  BILITOT 0.7 0.8 0.5  PROT 7.3 6.2* 6.3*  ALBUMIN 4.2 3.6 3.7   Recent Labs  Lab 07/05/20 1554  LIPASE 30   No results for input(s): AMMONIA in the last 168 hours. Coagulation Profile: No results for input(s): INR, PROTIME in the last 168 hours. Cardiac Enzymes: No results for input(s): CKTOTAL, CKMB, CKMBINDEX, TROPONINI in the last 168 hours. BNP (last 3 results) No results for input(s): PROBNP in the last 8760 hours. HbA1C: No results for input(s): HGBA1C in the last 72 hours. CBG: No results for input(s): GLUCAP in the last 168 hours. Lipid Profile: No results for input(s): CHOL, HDL, LDLCALC, TRIG, CHOLHDL, LDLDIRECT in the last 72 hours. Thyroid Function Tests: No results for input(s): TSH, T4TOTAL, FREET4, T3FREE, THYROIDAB in the last 72 hours. Anemia Panel: No results for input(s): VITAMINB12, FOLATE, FERRITIN, TIBC, IRON, RETICCTPCT in the last 72 hours. Sepsis Labs: No results for input(s):  PROCALCITON, LATICACIDVEN in the last 168 hours.  Recent Results (from the past 240 hour(s))  Respiratory Panel by RT PCR (Flu A&B, Covid) - Nasopharyngeal Swab     Status: None   Collection Time: 07/05/20 11:27 PM   Specimen: Nasopharyngeal Swab  Result Value Ref Range Status   SARS Coronavirus 2 by RT PCR NEGATIVE NEGATIVE Final   Influenza A by PCR NEGATIVE NEGATIVE Final   Influenza B by PCR NEGATIVE NEGATIVE Final    Comment: Performed at [facility], [address], [place], [place] [address]  Culture, blood (routine x 2)     Status: None (Preliminary result)   Collection Time: 07/06/20 12:59 AM   Specimen: BLOOD LEFT HAND  Result Value Ref Range Status   Specimen Description BLOOD LEFT HAND  Final   Special Requests   Final    BOTTLES DRAWN AEROBIC AND ANAEROBIC Blood Culture adequate volume   Culture   Final  NO GROWTH 3 DAYS Performed at [facility], [address], [place], [place] [address]    Report Status PENDING  Incomplete  Culture, blood (routine x 2)     Status: None (Preliminary result)   Collection Time: 07/06/20  1:05 AM   Specimen: Right Antecubital; Blood  Result Value Ref Range Status   Specimen Description RIGHT ANTECUBITAL  Final   Special Requests   Final    BOTTLES DRAWN AEROBIC AND ANAEROBIC Blood Culture adequate volume   Culture   Final    NO GROWTH 3 DAYS Performed at [facility], [address], [place], [place] [address]    Report Status PENDING  Incomplete         Radiology Studies: No results found.      Scheduled Meds: . dicyclomine  10 mg Oral BID AC  . ondansetron (ZOFRAN) IV  4 mg Intravenous Q6H  . pantoprazole  40 mg Oral BID AC  . rivaroxaban  20 mg Oral Q supper  . sucralfate  1 g Oral TID WC & HS   Continuous Infusions:   LOS: 3 days    Time spent: 13mins    Kathie Dike, MD [facility]   If 7PM-7AM, please contact night-coverage [email or web address]  07/09/2020, 6:04 PM

## 2020-07-10 ENCOUNTER — Inpatient Hospital Stay (HOSPITAL_COMMUNITY): Payer: Medicaid Other | Admitting: Anesthesiology

## 2020-07-10 ENCOUNTER — Encounter (HOSPITAL_COMMUNITY): Payer: Self-pay | Admitting: Family Medicine

## 2020-07-10 ENCOUNTER — Encounter (HOSPITAL_COMMUNITY)
Admission: EM | Disposition: A | Discharge status: Home / Self Care | Payer: Self-pay | Source: Home / Self Care | Attending: Internal Medicine

## 2020-07-10 DIAGNOSIS — R933 Abnormal findings on diagnostic imaging of other parts of digestive tract: Secondary | ICD-10-CM

## 2020-07-10 DIAGNOSIS — K269 Duodenal ulcer, unspecified as acute or chronic, without hemorrhage or perforation: Secondary | ICD-10-CM

## 2020-07-10 DIAGNOSIS — K449 Diaphragmatic hernia without obstruction or gangrene: Secondary | ICD-10-CM

## 2020-07-10 DIAGNOSIS — Z8711 Personal history of peptic ulcer disease: Secondary | ICD-10-CM

## 2020-07-10 DIAGNOSIS — R1011 Right upper quadrant pain: Secondary | ICD-10-CM

## 2020-07-10 DIAGNOSIS — K3189 Other diseases of stomach and duodenum: Secondary | ICD-10-CM

## 2020-07-10 DIAGNOSIS — K315 Obstruction of duodenum: Secondary | ICD-10-CM

## 2020-07-10 DIAGNOSIS — R1013 Epigastric pain: Secondary | ICD-10-CM | POA: Diagnosis not present

## 2020-07-10 DIAGNOSIS — R112 Nausea with vomiting, unspecified: Secondary | ICD-10-CM

## 2020-07-10 HISTORY — PX: ESOPHAGOGASTRODUODENOSCOPY (EGD) WITH PROPOFOL: SHX5813

## 2020-07-10 SURGERY — ESOPHAGOGASTRODUODENOSCOPY (EGD) WITH PROPOFOL
Anesthesia: General

## 2020-07-10 MED ORDER — FENTANYL CITRATE (PF) 100 MCG/2ML IJ SOLN
INTRAMUSCULAR | Status: AC
  Filled 2020-07-10: qty 2

## 2020-07-10 MED ORDER — DEXAMETHASONE SODIUM PHOSPHATE 10 MG/ML IJ SOLN
INTRAMUSCULAR | Status: DC | PRN
  Administered 2020-07-10: 4 mg via INTRAVENOUS

## 2020-07-10 MED ORDER — SUCCINYLCHOLINE CHLORIDE 200 MG/10ML IV SOSY
PREFILLED_SYRINGE | INTRAVENOUS | Status: AC
  Filled 2020-07-10: qty 10

## 2020-07-10 MED ORDER — PANTOPRAZOLE SODIUM 40 MG IV SOLR: 40.0000 mg | Freq: Two times a day (BID) | INTRAVENOUS | Status: DC

## 2020-07-10 MED ORDER — LACTATED RINGERS IV SOLN: INTRAVENOUS | Status: DC

## 2020-07-10 MED ORDER — LIDOCAINE 2% (20 MG/ML) 5 ML SYRINGE
INTRAMUSCULAR | Status: AC
  Filled 2020-07-10: qty 5

## 2020-07-10 MED ORDER — PROPOFOL 10 MG/ML IV BOLUS
INTRAVENOUS | Status: DC | PRN
  Administered 2020-07-10: 100 mg via INTRAVENOUS
  Administered 2020-07-10: 50 mg via INTRAVENOUS

## 2020-07-10 MED ORDER — LACTATED RINGERS IV SOLN: INTRAVENOUS | Status: DC | PRN

## 2020-07-10 MED ORDER — LACTATED RINGERS IV SOLN
Freq: Once | INTRAVENOUS | Status: AC
  Administered 2020-07-10: 1000 mL via INTRAVENOUS

## 2020-07-10 MED ORDER — LACTATED RINGERS IV SOLN: Freq: Once | INTRAVENOUS | Status: DC

## 2020-07-10 MED ORDER — SUCCINYLCHOLINE CHLORIDE 200 MG/10ML IV SOSY
PREFILLED_SYRINGE | INTRAVENOUS | Status: DC | PRN
  Administered 2020-07-10: 100 mg via INTRAVENOUS

## 2020-07-10 MED ORDER — DEXAMETHASONE SODIUM PHOSPHATE 4 MG/ML IJ SOLN
INTRAMUSCULAR | Status: AC
  Filled 2020-07-10: qty 1

## 2020-07-10 MED ORDER — PANTOPRAZOLE SODIUM 40 MG IV SOLR
40.0000 mg | Freq: Two times a day (BID) | INTRAVENOUS | Status: DC
  Administered 2020-07-10 – 2020-07-12 (×4): 40 mg via INTRAVENOUS
  Filled 2020-07-10 (×5): qty 40

## 2020-07-10 MED ORDER — ONDANSETRON HCL 4 MG/2ML IJ SOLN
INTRAMUSCULAR | Status: AC
  Filled 2020-07-10: qty 2

## 2020-07-10 MED ORDER — FENTANYL CITRATE (PF) 100 MCG/2ML IJ SOLN
INTRAMUSCULAR | Status: DC | PRN
  Administered 2020-07-10 (×2): 50 ug via INTRAVENOUS

## 2020-07-10 MED ORDER — LIDOCAINE 2% (20 MG/ML) 5 ML SYRINGE
INTRAMUSCULAR | Status: DC | PRN
  Administered 2020-07-10: 80 mg via INTRAVENOUS

## 2020-07-10 NOTE — Progress Notes (Signed)
Brief EGD note.  Normal mucosa of the esophagus and GE junction. 5 cm sliding hiatal hernia. 2 antral scars otherwise normal examination of stomach. Deformed but patent pylorus. To deep bulbar ulcer without stigmata of bleed. No perforation noted. Suture noted in the middle of ulcer along the posterior wall. Noncritical narrowing at angle of the duodenum. Normal post bulbar mucosa.

## 2020-07-10 NOTE — Anesthesia Postprocedure Evaluation (Signed)
Anesthesia Post Note  Patient: Maria Murphy  Procedure(s) Performed: ESOPHAGOGASTRODUODENOSCOPY (EGD) WITH PROPOFOL (N/A )  Patient location during evaluation: PACU Anesthesia Type: General Level of consciousness: awake, awake and alert, oriented and sedated Pain management: pain level controlled Vital Signs Assessment: post-procedure vital signs reviewed and stable Respiratory status: spontaneous breathing and respiratory function stable Cardiovascular status: blood pressure returned to baseline Postop Assessment: no apparent nausea or vomiting Anesthetic complications: no   No complications documented.   Last Vitals:  Vitals:   07/10/20 1430 07/10/20 1450  BP: 113/80 (!) 156/77  Pulse: 75 (!) 55  Resp: (!) 25 (!) 22  Temp:  36.8 C  SpO2: 98% 99%    Last Pain:  Vitals:   07/10/20 1450  TempSrc: Oral  PainSc: 0-No pain                 Maria Murphy C Kambree Krauss

## 2020-07-10 NOTE — Op Note (Signed)
[facility] Patient Name: Maria Murphy Procedure Date: 07/10/2020 1:10 PM MRN: [id number] Date of Birth: 1956/03/30 Attending MD: Hildred Laser , MD CSN: [id number] Age: 64 Admit Type: Outpatient Procedure:                Upper GI endoscopy Indications:              Abdominal pain in the right upper quadrant,                            Abnormal CT of the GI tract, Nausea with vomiting Providers:                Hildred Laser, MD, Otis Peak B. Sharon Seller, RN, Lambert Mody, Randa Spike, Technician Referring MD:             Wynona Neat, DO Medicines:                General Anesthesia Complications:            No immediate complications. Estimated Blood Loss:     Estimated blood loss: none. Procedure:                Pre-Anesthesia Assessment:                           - Prior to the procedure, a History and Physical                            was performed, and patient medications and                            allergies were reviewed. The patient's tolerance of                            previous anesthesia was also reviewed. The risks                            and benefits of the procedure and the sedation                            options and risks were discussed with the patient.                            All questions were answered, and informed consent                            was obtained. Prior Anticoagulants: The patient                            last took Eliquis (apixaban) 2 days prior to the                            procedure. ASA Grade Assessment: III - A patient  with severe systemic disease. After reviewing the                            risks and benefits, the patient was deemed in                            satisfactory condition to undergo the procedure.                           After obtaining informed consent, the endoscope was                            passed under direct vision. Throughout the                             procedure, the patient's blood pressure, pulse, and                            oxygen saturations were monitored continuously. The                            [id number]-H190 ([id number]) scope was introduced through the                            mouth, and advanced to the second part of duodenum.                            The upper GI endoscopy was accomplished without                            difficulty. The patient tolerated the procedure                            well. Scope In: 2:09:12 PM Scope Out: 2:14:36 PM Total Procedure Duration: 0 hours 5 minutes 24 seconds  Findings:      The hypopharynx was normal.      The examined esophagus was normal.      The Z-line was regular and was found 33 cm from the incisors.      A 5 cm hiatal hernia was present.      A healed ulcer was found in the gastric antrum and in the prepyloric       region of the stomach.      The exam of the stomach was otherwise normal.      A deformity was found at the pylorus.      Two non-bleeding cratered duodenal ulcers with no stigmata of bleeding       were found in the duodenal bulb. The largest lesion was 15 mm in largest       dimension.      An acquired benign-appearing, intrinsic mild stenosis was found in the       distal duodenal bulb and was traversed.      The second portion of the duodenum was normal. Impression:               - Normal hypopharynx.                           -  Normal esophagus.                           - Z-line regular, 35 cm from the incisors.                           - 5 cm hiatal hernia.                           - Scar in the gastric antrum and in the prepyloric                            region of the stomach.                           - Acquired deformity in the pylorus. It is patent.                           - Non-bleeding duodenal ulcers with no stigmata of                            bleeding.                           - Acquired distal bulbar  duodenal stenosis.                           - Normal second portion of the duodenum.                           - No specimens collected. Moderate Sedation:      Per Anesthesia Care Recommendation:           - Return patient to hospital ward for ongoing care.                           - NPO ecept pomeds.                           - Continue present medications.                           - Change PPI to IV route.                           - Perform an H. pylori serology.                           - Hold anticoagulant for 48 hours. anti-coagulant                            and watch closely for bleed.                           - Repeat EGD in 12 weeks. Procedure Code(s):        --- Professional ---  29924, Esophagogastroduodenoscopy, flexible,                            transoral; diagnostic, including collection of                            specimen(s) by brushing or washing, when performed                            (separate procedure) Diagnosis Code(s):        --- Professional ---                           K44.9, Diaphragmatic hernia without obstruction or                            gangrene                           K31.89, Other diseases of stomach and duodenum                           K26.9, Duodenal ulcer, unspecified as acute or                            chronic, without hemorrhage or perforation                           K31.5, Obstruction of duodenum                           R10.11, Right upper quadrant pain                           R11.2, Nausea with vomiting, unspecified                           R93.3, Abnormal findings on diagnostic imaging of                            other parts of digestive tract CPT copyright 2019 [employer]. All rights reserved. The codes documented in this report are preliminary and upon coder review may  be revised to meet current compliance requirements. Hildred Laser, MD Hildred Laser,  MD 07/10/2020 2:38:43 PM This report has been signed electronically. Number of Addenda: 0

## 2020-07-10 NOTE — Anesthesia Preprocedure Evaluation (Addendum)
Anesthesia Evaluation  Patient identified by MRN, date of birth, ID band Patient awake    Reviewed: Allergy & Precautions, NPO status , Patient's Chart, lab work & pertinent test results  History of Anesthesia Complications Negative for: history of anesthetic complications  Airway Mallampati: II  TM Distance: >3 FB Neck ROM: Full    Dental  (+) Dental Advisory Given, Lower Dentures   Pulmonary neg pulmonary ROS,    Pulmonary exam normal breath sounds clear to auscultation       Cardiovascular Exercise Tolerance: Good hypertension, Pt. on medications Normal cardiovascular exam Rhythm:Regular Rate:Normal     Neuro/Psych PSYCHIATRIC DISORDERS Depression negative neurological ROS     GI/Hepatic Neg liver ROS, PUD,   Endo/Other  negative endocrine ROS  Renal/GU negative Renal ROS     Musculoskeletal negative musculoskeletal ROS (+)   Abdominal   Peds  Hematology negative hematology ROS (+)   Anesthesia Other Findings   Reproductive/Obstetrics negative OB ROS                            Anesthesia Physical Anesthesia Plan  ASA: III  Anesthesia Plan: General   Post-op Pain Management:    Induction: Intravenous  PONV Risk Score and Plan: Ondansetron and Dexamethasone  Airway Management Planned: Oral ETT  Additional Equipment:   Intra-op Plan:   Post-operative Plan: Possible Post-op intubation/ventilation  Informed Consent: I have reviewed the patients History and Physical, chart, labs and discussed the procedure including the risks, benefits and alternatives for the proposed anesthesia with the patient or authorized representative who has indicated his/her understanding and acceptance.     Dental advisory given  Plan Discussed with: CRNA and Surgeon  Anesthesia Plan Comments: (GA with ETT discussed )       Anesthesia Quick Evaluation

## 2020-07-10 NOTE — Progress Notes (Signed)
PROGRESS NOTE    Maria Murphy  HYQ:[id number] DOB: November 04, 1955 DOA: 07/05/2020 PCP: Patient, No Pcp Per   Brief Narrative:  64 year old female with recent hospitalization and complicated course involving perforated gastric ulcer treated with operative management subsequent development of abdominal abscess which required drain placement.  Hospital course was further complicated by development of saddle pulmonary embolus and she was started on anticoagulation.  Patient presents to the hospital with abdominal pain, nausea and vomiting.  CT showed possible inflammatory fistula versus recurrent ulcer.  GI and general surgery following.  Felt her symptoms may be related to gastritis versus residual peptic ulcer disease.  Clinically she appears to be stable.  Advancing diet as tolerated with symptom management.   Assessment & Plan:   Active Problems:   Peptic ulcer with perforation (HCC)   Acute saddle pulmonary embolism without acute cor pulmonale (HCC)   Abdominal pain   Non-intractable vomiting   Abdominal pain with vomiting -Etiology is unclear -CT of the abdomen shows possible inflammatory fistula versus recurrent ulcer -GI and general surgery following -Symptoms may be related to gastritis versus residual peptic ulcer disease -She is on PPI and Carafate -Patient does admit to taking ibuprofen 3 times daily for a few weeks prior to admission -Question if he would benefit from endoscopic evaluation to evaluate for any developing peptic stricture or persistent PUD -We will hold further Xarelto, last dose was on 11/15 at 5 PM -Continue supportive management with antiemetics and pain medicine -No leukocytosis or fever at this time. -Endoscopy with findings of deep bulbar ulcer without stigmata of bleed noted on 11/17.  Plans for n.p.o. except medications and maintain on IV PPI with further need for evaluation in the next 1-2 days depending on clinical progress  Pulmonary embolism -On  previous hospitalization, patient was diagnosed with saddle pulmonary embolism -She had been discharged on Xarelto -Patient reports that she had only taken Xarelto for a month and has not taken any anticoagulants in approximately 2 weeks -She was restarted on Xarelto and will likely need a minimum of 6 months of therapy -She is not short of breath at this time -Xarelto held for now  Hypertension -Blood pressures currently stable -Plan to resume some of her oral blood pressure medications   DVT prophylaxis: SCDs Start: 07/06/20 0519 Code Status: Full code Family Communication: Discussed with patient Disposition Plan: Status is: Inpatient  Remains inpatient appropriate because:IV treatments appropriate due to intensity of illness or inability to take PO   Dispo: The patient is from: Home  Anticipated d/c is to: Home  Anticipated d/c date is: 1-2 days  Patient currently is not medically stable to d/c.    Consultants:   General surgery  Gastroenterology  Procedures:   EGD 07/10/2020  Antimicrobials:    Anti-infectives (From admission, onward)   Start     Dose/Rate Route Frequency Ordered Stop   07/06/20 1030  piperacillin-tazobactam (ZOSYN) IVPB 3.375 g  Status:  Discontinued        3.375 g 12.5 mL/hr over 240 Minutes Intravenous Every 8 hours 07/06/20 1020 07/06/20 1653   07/05/20 2330  piperacillin-tazobactam (ZOSYN) IVPB 3.375 g        3.375 g 100 mL/hr over 30 Minutes Intravenous  Once 07/05/20 2325 07/06/20 0020       Subjective: Patient seen and evaluated today with no new acute complaints or concerns. No acute concerns or events noted overnight.  Objective: Vitals:   07/10/20 1254 07/10/20 1425 07/10/20 1430 07/10/20 1450  BP: Marland Kitchen)  145/77 (!) 141/78 113/80 (!) 156/77  Pulse: (!) 51 91 75 (!) 55  Resp: 18 (!) 22 (!) 25 (!) 22  Temp: 98.1 F (36.7 C) 97.9 F (36.6 C)  98.3 F (36.8 C)  TempSrc: Oral   Oral    SpO2: 96% 99% 98% 99%  Weight: 81.6 kg     Height: 5\' 4"  (1.626 m)       Intake/Output Summary (Last 24 hours) at 07/10/2020 1623 Last data filed at 07/10/2020 1423 Gross per 24 hour  Intake 487 ml  Output --  Net 487 ml   Filed Weights   07/07/20 0500 07/10/20 0530 07/10/20 1254  Weight: 85.5 kg 82.6 kg 81.6 kg    Examination:  General exam: Appears calm and comfortable  Respiratory system: Clear to auscultation. Respiratory effort normal. Cardiovascular system: S1 & S2 heard, RRR.  Gastrointestinal system: Abdomen is nondistended, soft and nontender.  Central nervous system: Alert and awake Extremities: No edema Skin: No rashes, lesions or ulcers Psychiatry: Judgement and insight appear normal. Mood & affect appropriate.     Data Reviewed: I have personally reviewed following labs and imaging studies  CBC: Recent Labs  Lab 07/05/20 1554 07/06/20 0538 07/09/20 1338  WBC 6.6 7.7 3.8*  NEUTROABS  --  6.2 2.3  HGB 12.1 11.1* 11.1*  HCT 37.4 35.6* 34.3*  MCV 89.9 93.2 89.3  PLT 210 182 199   Basic Metabolic Panel: Recent Labs  Lab 07/05/20 1554 07/06/20 0538 07/09/20 1338  NA 138 136 137  K 3.8 3.6 3.6  CL 103 106 104  CO2 26 23 25   GLUCOSE 121* 109* 109*  BUN 9 10 8   CREATININE 0.71 0.65 0.76  CALCIUM 9.2 8.3* 8.9  MG  --  1.8  --    GFR: Estimated Creatinine Clearance: 74.4 mL/min (by C-G formula based on SCr of 0.76 mg/dL). Liver Function Tests: Recent Labs  Lab 07/05/20 1554 07/06/20 0538 07/09/20 1338  AST 17 17 21   ALT 10 10 10   ALKPHOS 54 47 54  BILITOT 0.7 0.8 0.5  PROT 7.3 6.2* 6.3*  ALBUMIN 4.2 3.6 3.7   Recent Labs  Lab 07/05/20 1554  LIPASE 30   No results for input(s): AMMONIA in the last 168 hours. Coagulation Profile: No results for input(s): INR, PROTIME in the last 168 hours. Cardiac Enzymes: No results for input(s): CKTOTAL, CKMB, CKMBINDEX, TROPONINI in the last 168 hours. BNP (last 3 results) No results for  input(s): PROBNP in the last 8760 hours. HbA1C: No results for input(s): HGBA1C in the last 72 hours. CBG: No results for input(s): GLUCAP in the last 168 hours. Lipid Profile: No results for input(s): CHOL, HDL, LDLCALC, TRIG, CHOLHDL, LDLDIRECT in the last 72 hours. Thyroid Function Tests: No results for input(s): TSH, T4TOTAL, FREET4, T3FREE, THYROIDAB in the last 72 hours. Anemia Panel: No results for input(s): VITAMINB12, FOLATE, FERRITIN, TIBC, IRON, RETICCTPCT in the last 72 hours. Sepsis Labs: No results for input(s): PROCALCITON, LATICACIDVEN in the last 168 hours.  Recent Results (from the past 240 hour(s))  Respiratory Panel by RT PCR (Flu A&B, Covid) - Nasopharyngeal Swab     Status: None   Collection Time: 07/05/20 11:27 PM   Specimen: Nasopharyngeal Swab  Result Value Ref Range Status   SARS Coronavirus 2 by RT PCR NEGATIVE NEGATIVE Final   Influenza A by PCR NEGATIVE NEGATIVE Final   Influenza B by PCR NEGATIVE NEGATIVE Final    Comment: Performed at [facility], [address], [place], [place] [address]  Culture, blood (routine x 2)     Status: None (Preliminary result)   Collection Time: 07/06/20 12:59 AM   Specimen: BLOOD LEFT HAND  Result Value Ref Range Status   Specimen Description BLOOD LEFT HAND  Final   Special Requests   Final    BOTTLES DRAWN AEROBIC AND ANAEROBIC Blood Culture adequate volume   Culture   Final    NO GROWTH 4 DAYS Performed at [facility], [address] [place], [place] [address]    Report Status PENDING  Incomplete  Culture, blood (routine x 2)     Status: None (Preliminary result)   Collection Time: 07/06/20  1:05 AM   Specimen: Right Antecubital; Blood  Result Value Ref Range Status   Specimen Description RIGHT ANTECUBITAL  Final   Special Requests   Final    BOTTLES DRAWN AEROBIC AND ANAEROBIC Blood Culture adequate volume   Culture   Final    NO GROWTH 4 DAYS Performed at [facility], [address] [place], [place]  [address]    Report Status PENDING  Incomplete         Radiology Studies: No results found.      Scheduled Meds: . dicyclomine  10 mg Oral BID AC  . ondansetron (ZOFRAN) IV  4 mg Intravenous Q6H  . pantoprazole (PROTONIX) IV  40 mg Intravenous Q12H  . sucralfate  1 g Oral TID WC & HS   Continuous Infusions: . lactated ringers       LOS: 4 days    Time spent: 30 minutes    Inette Doubrava Hoover Brunette, DO [facility]  If 7PM-7AM, please contact night-coverage [email or web address] 07/10/2020, 4:23 PM

## 2020-07-10 NOTE — Transfer of Care (Signed)
Immediate Anesthesia Transfer of Care Note  Patient: Maria Murphy  Procedure(s) Performed: ESOPHAGOGASTRODUODENOSCOPY (EGD) WITH PROPOFOL (N/A )  Patient Location: PACU  Anesthesia Type:General  Level of Consciousness: awake and alert   Airway & Oxygen Therapy: Patient Spontanous Breathing and Patient connected to nasal cannula oxygen  Post-op Assessment: Report given to RN and Post -op Vital signs reviewed and stable  Post vital signs: Reviewed and stable 3 Last Vitals:  Vitals Value Taken Time  BP 141/78 07/10/20 1422  Temp    Pulse 91 07/10/20 1424  Resp 35 07/10/20 1424  SpO2 99 % 07/10/20 1424  Vitals shown include unvalidated device data.  Last Pain:  Vitals:   07/10/20 1401  TempSrc:   PainSc: 5       Patients Stated Pain Goal: 9 (48/14/43 9265)  Complications: No complications documented.

## 2020-07-10 NOTE — Progress Notes (Signed)
  Subjective: No significant change since yesterday.  Objective: Vital signs in last 24 hours: Temp:  [97.7 F (36.5 C)-98.2 F (36.8 C)] 97.8 F (36.6 C) (11/17 0530) Pulse Rate:  [52-57] 52 (11/17 0530) Resp:  [20] 20 (11/17 0530) BP: (133-169)/(73-89) 169/75 (11/17 0530) SpO2:  [97 %-98 %] 97 % (11/17 0530) Weight:  [82.6 kg] 82.6 kg (11/17 0530) Last BM Date: 07/07/20  Intake/Output from previous day: 11/16 0701 - 11/17 0700 In: 567 [P.O.:480; I.V.:87] Out: -  Intake/Output this shift: No intake/output data recorded.  General appearance: alert, cooperative and no distress GI: soft, non-tender; bowel sounds normal; no masses,  no organomegaly  Lab Results:  Recent Labs    07/09/20 1338  WBC 3.8*  HGB 11.1*  HCT 34.3*  PLT 199   BMET Recent Labs    07/09/20 1338  NA 137  K 3.6  CL 104  CO2 25  GLUCOSE 109*  BUN 8  CREATININE 0.76  CALCIUM 8.9   PT/INR No results for input(s): LABPROT, INR in the last 72 hours.  Studies/Results: No results found.  Anti-infectives: Anti-infectives (From admission, onward)   Start     Dose/Rate Route Frequency Ordered Stop   07/06/20 1030  piperacillin-tazobactam (ZOSYN) IVPB 3.375 g  Status:  Discontinued        3.375 g 12.5 mL/hr over 240 Minutes Intravenous Every 8 hours 07/06/20 1020 07/06/20 1653   07/05/20 2330  piperacillin-tazobactam (ZOSYN) IVPB 3.375 g        3.375 g 100 mL/hr over 30 Minutes Intravenous  Once 07/05/20 2325 07/06/20 0020      Assessment/Plan: s/p Procedure(s): ESOPHAGOGASTRODUODENOSCOPY (EGD) WITH PROPOFOL Impression: For EGD today.  Further management is pending those results.  LOS: 4 days    Aviva Signs 07/10/2020

## 2020-07-10 NOTE — Progress Notes (Signed)
Subjective:  Patient feels better.  She has not vomited in about 24 hours.  She says nausea is well controlled with medication.  She reports pain in epigastrium and right upper quadrant every time she eats.  No history of melena or rectal bleeding.  She states she has lost 20 pounds since her surgery for perforated gastric ulcer about 3 months ago. She denies chest pain or shortness of breath.  Current Medications:  Current Facility-Administered Medications:  .  0.9 %  sodium chloride infusion, , Intravenous, Continuous, Roselyn Reef, Last Rate: 20 mL/hr at 07/09/20 2339, New Bag at 07/09/20 2339 .  [MAR Hold] acetaminophen (TYLENOL) tablet 650 mg, 650 mg, Oral, Q6H PRN, Blount, Xenia T, NP, 650 mg at 07/09/20 2340 .  [MAR Hold] dicyclomine (BENTYL) capsule 10 mg, 10 mg, Oral, BID AC, Harper, Kristen S, PA-C, 10 mg at 07/09/20 1745 .  [MAR Hold] hydrALAZINE (APRESOLINE) injection 10 mg, 10 mg, Intravenous, Q6H PRN, Zierle-Ghosh, Asia B, DO .  [MAR Hold] LORazepam (ATIVAN) injection 0.5 mg, 0.5 mg, Intravenous, Q6H PRN, Zierle-Ghosh, Asia B, DO .  [MAR Hold] morphine 4 MG/ML injection 4 mg, 4 mg, Intravenous, Q4H PRN, Zierle-Ghosh, Asia B, DO, 4 mg at 07/08/20 1046 .  [MAR Hold] ondansetron (ZOFRAN) tablet 4 mg, 4 mg, Oral, Q6H PRN, 4 mg at 07/06/20 2117 **OR** [MAR Hold] ondansetron (ZOFRAN) injection 4 mg, 4 mg, Intravenous, Q6H PRN, Zierle-Ghosh, Asia B, DO, 4 mg at 07/07/20 1045 .  [MAR Hold] ondansetron (ZOFRAN) injection 4 mg, 4 mg, Intravenous, Q6H, Memon, Jehanzeb, MD, 4 mg at 07/10/20 1155 .  [MAR Hold] pantoprazole (PROTONIX) EC tablet 40 mg, 40 mg, Oral, BID AC, Annitta Needs, NP, 40 mg at 07/09/20 1745 .  [MAR Hold] rivaroxaban (XARELTO) tablet 20 mg, 20 mg, Oral, Q supper, Memon, Jolaine Artist, MD, 20 mg at 07/08/20 1920 .  [MAR Hold] sucralfate (CARAFATE) 1 GM/10ML suspension 1 g, 1 g, Oral, TID WC & HS, Memon, Jehanzeb, MD, 1 g at 07/09/20 2139  Facility-Administered  Medications Ordered in Other Encounters:  .  lactated ringers infusion, , Intravenous, Continuous PRN, Orlie Dakin, CRNA, New Bag at 07/10/20 1257    Objective: Blood pressure (!) 145/77, pulse (!) 51, temperature 98.1 F (36.7 C), temperature source Oral, resp. rate 18, height $RemoveBe'5\' 4"'NGbIwfBLL$  (1.626 m), weight 81.6 kg, SpO2 96 %. Patient is alert and in no acute distress. Conjunctiva is pink. Sclera is nonicteric Oropharyngeal mucosa is normal. No neck masses or thyromegaly noted. Cardiac exam with regular rhythm normal S1 and S2. No murmur or gallop noted. Lungs are clear to auscultation. Abdomen is symmetrical.  She has small midline supraumbilical scar along with laparoscopy scars.  Bowel sounds are normal.  On palpation abdomen is soft.  She has mild tenderness in right upper quadrant epigastric region on deep palpation.  No guarding.  No organomegaly or masses. No LE edema or clubbing noted.  Labs/studies Results:  CBC Latest Ref Rng & Units 07/09/2020 07/06/2020 07/05/2020  WBC 4.0 - 10.5 K/uL 3.8(L) 7.7 6.6  Hemoglobin 12.0 - 15.0 g/dL 11.1(L) 11.1(L) 12.1  Hematocrit 36 - 46 % 34.3(L) 35.6(L) 37.4  Platelets 150 - 400 K/uL 199 182 210    CMP Latest Ref Rng & Units 07/09/2020 07/06/2020 07/05/2020  Glucose 70 - 99 mg/dL 109(H) 109(H) 121(H)  BUN 8 - 23 mg/dL $Remove'8 10 9  'qtpjZvG$ Creatinine 0.44 - 1.00 mg/dL 0.76 0.65 0.71  Sodium 135 - 145 mmol/L 137 136 138  Potassium 3.5 - 5.1 mmol/L 3.6 3.6 3.8  Chloride 98 - 111 mmol/L 104 106 103  CO2 22 - 32 mmol/L $RemoveB'25 23 26  'plSCpoPg$ Calcium 8.9 - 10.3 mg/dL 8.9 8.3(L) 9.2  Total Protein 6.5 - 8.1 g/dL 6.3(L) 6.2(L) 7.3  Total Bilirubin 0.3 - 1.2 mg/dL 0.5 0.8 0.7  Alkaline Phos 38 - 126 U/L 54 47 54  AST 15 - 41 U/L $Remo'21 17 17  'oQZgb$ ALT 0 - 44 U/L $Remo'10 10 10    'qYYxE$ Hepatic Function Latest Ref Rng & Units 07/09/2020 07/06/2020 07/05/2020  Total Protein 6.5 - 8.1 g/dL 6.3(L) 6.2(L) 7.3  Albumin 3.5 - 5.0 g/dL 3.7 3.6 4.2  AST 15 - 41 U/L $Remo'21 17 17  'DTQFy$ ALT 0 - 44 U/L $Remo'10 10  10  'RSGjP$ Alk Phosphatase 38 - 126 U/L 54 47 54  Total Bilirubin 0.3 - 1.2 mg/dL 0.5 0.8 0.7      Assessment:    Nausea vomiting abdominal pain in a patient who is status post patching of gastric ulcer about 3 months ago.  CT from admission suggestive of fistulous tract.  Suspect peptic ulcer disease.  #2.  History of pulmonary embolism about 2 months ago.  Anticoagulant on hold.  Plan:  Proceed with esophagogastroduodenoscopy. Discussed with Dr. Lottie Dawson who feels she should be intubated for airway protection as she may have a stomach full of fluid.

## 2020-07-10 NOTE — Anesthesia Procedure Notes (Signed)
Procedure Name: Intubation Date/Time: 07/10/2020 2:03 PM Performed by: Orlie Dakin, CRNA Pre-anesthesia Checklist: Emergency Drugs available, Patient identified, Suction available and Patient being monitored Patient Re-evaluated:Patient Re-evaluated prior to induction Oxygen Delivery Method: Circle system utilized Preoxygenation: Pre-oxygenation with 100% oxygen Induction Type: IV induction and Rapid sequence Laryngoscope Size: Miller and 3 Grade View: Grade I Tube type: Oral Tube size: 7.0 mm Number of attempts: 1 Airway Equipment and Method: Stylet Placement Confirmation: ETT inserted through vocal cords under direct vision,  positive ETCO2 and breath sounds checked- equal and bilateral Secured at: 23 cm Tube secured with: Tape Dental Injury: Teeth and Oropharynx as per pre-operative assessment

## 2020-07-11 DIAGNOSIS — R1010 Upper abdominal pain, unspecified: Secondary | ICD-10-CM | POA: Diagnosis not present

## 2020-07-11 DIAGNOSIS — K275 Chronic or unspecified peptic ulcer, site unspecified, with perforation: Secondary | ICD-10-CM | POA: Diagnosis not present

## 2020-07-11 DIAGNOSIS — K269 Duodenal ulcer, unspecified as acute or chronic, without hemorrhage or perforation: Secondary | ICD-10-CM

## 2020-07-11 DIAGNOSIS — R112 Nausea with vomiting, unspecified: Secondary | ICD-10-CM

## 2020-07-11 LAB — BASIC METABOLIC PANEL
Anion gap: 8 (ref 5–15)
BUN: 10 mg/dL (ref 8–23)
CO2: 25 mmol/L (ref 22–32)
Calcium: 8.8 mg/dL — ABNORMAL LOW (ref 8.9–10.3)
Chloride: 104 mmol/L (ref 98–111)
Creatinine, Ser: 0.73 mg/dL (ref 0.44–1.00)
GFR, Estimated: >60 mL/min (ref >60)
Glucose, Bld: 99 mg/dL (ref 70–99)
Potassium: 3.7 mmol/L (ref 3.5–5.1)
Sodium: 137 mmol/L (ref 135–145)

## 2020-07-11 LAB — CBC
HCT: 32.2 % — ABNORMAL LOW (ref 36.0–46.0)
Hemoglobin: 10.5 g/dL — ABNORMAL LOW (ref 12.0–15.0)
MCH: 28.9 pg (ref 26.0–34.0)
MCHC: 32.6 g/dL (ref 30.0–36.0)
MCV: 88.7 fL (ref 80.0–100.0)
Platelets: 201 10*3/uL (ref 150–400)
RBC: 3.63 MIL/uL — ABNORMAL LOW (ref 3.87–5.11)
RDW: 14.1 % (ref 11.5–15.5)
WBC: 5.2 10*3/uL (ref 4.0–10.5)
nRBC: 0 % (ref 0.0–0.2)

## 2020-07-11 MED ORDER — LIDOCAINE VISCOUS HCL 2 % MT SOLN
OROMUCOSAL | Status: AC
  Filled 2020-07-11: qty 15

## 2020-07-11 NOTE — Progress Notes (Signed)
PROGRESS NOTE    Maria Murphy  WGN:[id number] DOB: 1955-10-24 DOA: 07/05/2020 PCP: Patient, No Pcp Per   Brief Narrative:  64 year old female with recent hospitalization and complicated course involving perforated gastric ulcer treated with operative management subsequent development of abdominal abscess which required drain placement. Hospital course was further complicated by development of saddle pulmonary embolus and she was started on anticoagulation. Patient presents to the hospital with abdominal pain, nausea and vomiting. CT showed possible inflammatory fistula versus recurrent ulcer. GI and general surgery following. Felt her symptoms may be related to gastritis versus residual peptic ulcer disease. Clinically she appears to be stable. Advancing diet as tolerated with symptom management.  Assessment & Plan:   Active Problems:   Peptic ulcer with perforation (HCC)   Acute saddle pulmonary embolism without acute cor pulmonale (HCC)   Abdominal pain   Non-intractable vomiting   Abdominal pain with vomiting -Etiology is unclear -CT of the abdomen shows possible inflammatory fistula versus recurrent ulcer -GI and general surgery following -Symptoms may be related to gastritis versus residual peptic ulcer disease -She is on PPI and Carafate -Patient does admit to taking ibuprofen 3 times dailyfor a few weeks prior to admission -Question if he would benefit from endoscopic evaluation to evaluate for any developing peptic stricture or persistent PUD -We will hold further Xarelto, last dose was on 11/15 at 5 PM -Continue supportive management with antiemetics and pain medicine -No leukocytosis or fever at this time. -Endoscopy with findings of deep bulbar ulcer without stigmata of bleed noted on 11/17. Maintain on IV PPI with further need for evaluation in the next 1-2 days depending on clinical progress -Hemoglobin level with slight downtrend on 11/18, patient advanced to  clear liquid diet and IV fluid discontinued  Pulmonary embolism -On previous hospitalization, patient was diagnosed with saddle pulmonary embolism -She had been discharged on Xarelto -Patient reports that she had only taken Xarelto for a month and has not taken any anticoagulants in approximately 2 weeks -She was restarted on Xarelto and will likely need a minimum of 6 months of therapy -She is not short of breath at this time -Xarelto held for now  Hypertension -Blood pressures currently stable -Plan to resume some of her oral blood pressure medications   DVT prophylaxis:SCDs Start: 07/06/20 0519 Code Status:Full code Family Communication:Discussed with patient, patient states that she will call her sister Disposition Plan:Status is: Inpatient  Remains inpatient appropriate because:IV treatments appropriate due to intensity of illness or inability to take PO   Dispo: The patient is from: Home Anticipated d/c is to: Home Anticipated d/c date is: 1-2 days Patient currently is not medically stable to d/c.   Consultants:  General surgery  Gastroenterology  Procedures:  EGD 07/10/2020  Antimicrobials:  Anti-infectives (From admission, onward)   Start     Dose/Rate Route Frequency Ordered Stop   07/06/20 1030  piperacillin-tazobactam (ZOSYN) IVPB 3.375 g  Status:  Discontinued        3.375 g 12.5 mL/hr over 240 Minutes Intravenous Every 8 hours 07/06/20 1020 07/06/20 1653   07/05/20 2330  piperacillin-tazobactam (ZOSYN) IVPB 3.375 g        3.375 g 100 mL/hr over 30 Minutes Intravenous  Once 07/05/20 2325 07/06/20 0020       Subjective: Patient seen and evaluated today with no new acute complaints or concerns. No acute concerns or events noted overnight.  Objective: Vitals:   07/10/20 1854 07/10/20 1934 07/11/20 0500 07/11/20 0558  BP: (!) 143/81  139/73  Pulse: (!) 57   (!) 59  Resp: 16   18  Temp: 98.3 F  (36.8 C)   (!) 97.4 F (36.3 C)  TempSrc: Oral   Oral  SpO2: 95% 95%  97%  Weight:   83.3 kg   Height:        Intake/Output Summary (Last 24 hours) at 07/11/2020 0913 Last data filed at 07/10/2020 1423 Gross per 24 hour  Intake 400 ml  Output --  Net 400 ml   Filed Weights   07/10/20 0530 07/10/20 1254 07/11/20 0500  Weight: 82.6 kg 81.6 kg 83.3 kg    Examination:  General exam: Appears calm and comfortable  Respiratory system: Clear to auscultation. Respiratory effort normal. Cardiovascular system: S1 & S2 heard, RRR.  Gastrointestinal system: Abdomen is nondistended, soft and nontender. Central nervous system: Alert and awake Extremities: No edema Skin: No rashes, lesions or ulcers Psychiatry: Judgement and insight appear normal. Mood & affect appropriate.     Data Reviewed: I have personally reviewed following labs and imaging studies  CBC: Recent Labs  Lab 07/05/20 1554 07/06/20 0538 07/09/20 1338 07/11/20 0503  WBC 6.6 7.7 3.8* 5.2  NEUTROABS  --  6.2 2.3  --   HGB 12.1 11.1* 11.1* 10.5*  HCT 37.4 35.6* 34.3* 32.2*  MCV 89.9 93.2 89.3 88.7  PLT 210 182 199 201   Basic Metabolic Panel: Recent Labs  Lab 07/05/20 1554 07/06/20 0538 07/09/20 1338 07/11/20 0503  NA 138 136 137 137  K 3.8 3.6 3.6 3.7  CL 103 106 104 104  CO2 26 23 25 25   GLUCOSE 121* 109* 109* 99  BUN 9 10 8 10   CREATININE 0.71 0.65 0.76 0.73  CALCIUM 9.2 8.3* 8.9 8.8*  MG  --  1.8  --   --    GFR: Estimated Creatinine Clearance: 75.1 mL/min (by C-G formula based on SCr of 0.73 mg/dL). Liver Function Tests: Recent Labs  Lab 07/05/20 1554 07/06/20 0538 07/09/20 1338  AST 17 17 21   ALT 10 10 10   ALKPHOS 54 47 54  BILITOT 0.7 0.8 0.5  PROT 7.3 6.2* 6.3*  ALBUMIN 4.2 3.6 3.7   Recent Labs  Lab 07/05/20 1554  LIPASE 30   No results for input(s): AMMONIA in the last 168 hours. Coagulation Profile: No results for input(s): INR, PROTIME in the last 168 hours. Cardiac  Enzymes: No results for input(s): CKTOTAL, CKMB, CKMBINDEX, TROPONINI in the last 168 hours. BNP (last 3 results) No results for input(s): PROBNP in the last 8760 hours. HbA1C: No results for input(s): HGBA1C in the last 72 hours. CBG: No results for input(s): GLUCAP in the last 168 hours. Lipid Profile: No results for input(s): CHOL, HDL, LDLCALC, TRIG, CHOLHDL, LDLDIRECT in the last 72 hours. Thyroid Function Tests: No results for input(s): TSH, T4TOTAL, FREET4, T3FREE, THYROIDAB in the last 72 hours. Anemia Panel: No results for input(s): VITAMINB12, FOLATE, FERRITIN, TIBC, IRON, RETICCTPCT in the last 72 hours. Sepsis Labs: No results for input(s): PROCALCITON, LATICACIDVEN in the last 168 hours.  Recent Results (from the past 240 hour(s))  Respiratory Panel by RT PCR (Flu A&B, Covid) - Nasopharyngeal Swab     Status: None   Collection Time: 07/05/20 11:27 PM   Specimen: Nasopharyngeal Swab  Result Value Ref Range Status   SARS Coronavirus 2 by RT PCR NEGATIVE NEGATIVE Final   Influenza A by PCR NEGATIVE NEGATIVE Final   Influenza B by PCR NEGATIVE NEGATIVE Final  Comment: Performed at [facility], [address] [place], [place] [address]  Culture, blood (routine x 2)     Status: None (Preliminary result)   Collection Time: 07/06/20 12:59 AM   Specimen: BLOOD LEFT HAND  Result Value Ref Range Status   Specimen Description BLOOD LEFT HAND  Final   Special Requests   Final    BOTTLES DRAWN AEROBIC AND ANAEROBIC Blood Culture adequate volume   Culture   Final    NO GROWTH 4 DAYS Performed at [facility], [address], [place], [place] [address]    Report Status PENDING  Incomplete  Culture, blood (routine x 2)     Status: None (Preliminary result)   Collection Time: 07/06/20  1:05 AM   Specimen: Right Antecubital; Blood  Result Value Ref Range Status   Specimen Description RIGHT ANTECUBITAL  Final   Special Requests   Final    BOTTLES DRAWN AEROBIC AND ANAEROBIC  Blood Culture adequate volume   Culture   Final    NO GROWTH 4 DAYS Performed at [facility], [address], [place], [place] [address]    Report Status PENDING  Incomplete         Radiology Studies: No results found.      Scheduled Meds: . dicyclomine  10 mg Oral BID AC  . ondansetron (ZOFRAN) IV  4 mg Intravenous Q6H  . pantoprazole (PROTONIX) IV  40 mg Intravenous Q12H  . sucralfate  1 g Oral TID WC & HS     LOS: 5 days    Time spent: 30 minutes    Kerri Kovacik Hoover Brunette, DO [facility]  If 7PM-7AM, please contact night-coverage [email or web address] 07/11/2020, 9:13 AM

## 2020-07-11 NOTE — Plan of Care (Signed)

## 2020-07-11 NOTE — Progress Notes (Signed)
EGD findings noted.  Nothing to add from the surgery standpoint.  We will follow peripherally with you.

## 2020-07-11 NOTE — Progress Notes (Addendum)
Subjective:  Feels good. Denies abdominal pain. No n/v. Tolerating clear liquids. Sister at bedside.  Objective: Vital signs in last 24 hours: Temp:  [97.4 F (36.3 C)-98.3 F (36.8 C)] 98.1 F (36.7 C) (11/18 1338) Pulse Rate:  [57-72] 72 (11/18 1338) Resp:  [16-20] 20 (11/18 1338) BP: (131-143)/(73-81) 131/73 (11/18 1338) SpO2:  [95 %-97 %] 97 % (11/18 0558) Weight:  [83.3 kg] 83.3 kg (11/18 0500) Last BM Date: 07/07/20 General:   Alert,  Well-developed, well-nourished, pleasant and cooperative in NAD Head:  Normocephalic and atraumatic. Eyes:  Sclera clear, no icterus.  Abdomen:  Soft, nontender and nondistended. Normal bowel sounds, without guarding, and without rebound.   Extremities:  Without clubbing, deformity or edema. Neurologic:  Alert and  oriented x4;  grossly normal neurologically. Skin:  Intact without significant lesions or rashes. Psych:  Alert and cooperative. Normal mood and affect.  Intake/Output from previous day: 11/17 0701 - 11/18 0700 In: 400 [I.V.:400] Out: -  Intake/Output this shift: No intake/output data recorded.  Lab Results: CBC Recent Labs    07/09/20 1338 07/11/20 0503  WBC 3.8* 5.2  HGB 11.1* 10.5*  HCT 34.3* 32.2*  MCV 89.3 88.7  PLT 199 201   BMET Recent Labs    07/09/20 1338 07/11/20 0503  NA 137 137  K 3.6 3.7  CL 104 104  CO2 25 25  GLUCOSE 109* 99  BUN 8 10  CREATININE 0.76 0.73  CALCIUM 8.9 8.8*   LFTs Recent Labs    07/09/20 1338  BILITOT 0.5  ALKPHOS 54  AST 21  ALT 10  PROT 6.3*  ALBUMIN 3.7   No results for input(s): LIPASE in the last 72 hours. PT/INR No results for input(s): LABPROT, INR in the last 72 hours.    Imaging Studies: CT Abdomen Pelvis W Contrast  Result Date: 07/05/2020 CLINICAL DATA:  Right upper and right lower quadrant abdominal pain, vomiting EXAM: CT ABDOMEN AND PELVIS WITH CONTRAST TECHNIQUE: Multidetector CT imaging of the abdomen and pelvis was performed using the standard  protocol following bolus administration of intravenous contrast. CONTRAST:  162mL OMNIPAQUE IOHEXOL 300 MG/ML  SOLN COMPARISON:  05/13/2020 FINDINGS: Lower chest: No acute pleural or parenchymal lung disease. Bibasilar hypoventilatory changes. Hepatobiliary: Multiple calcified gallstones again identified without cholecystitis. The liver is unremarkable. Pancreas: Stable cystic structure in the pancreatic head measuring 2.8 cm, with associated coarse calcifications, likely representing residual pseudocyst after previous pancreatitis. There are no acute inflammatory changes. Spleen: Normal in size without focal abnormality. Adrenals/Urinary Tract: Adrenal glands are unremarkable. There are numerous bilateral peripelvic and cortical cysts within the kidneys. Complex peripherally calcified cyst upper pole right kidney measures 2 cm. These have been previously characterized by MRI and shown to demonstrate benign characteristics. The bladder is unremarkable. Stomach/Bowel: No bowel obstruction or ileus. Diverticulosis of the descending and sigmoid colon without diverticulitis. Normal appendix in the right lower quadrant. There is a small amount of free fluid in the right upper quadrant adjacent to the gastric antrum/pylorus. A small tract containing a punctate focus of gas extends from the gastric wall toward the porta hepatis, reference image 26 series 2 and image 34 series 5. Differential diagnosis would be postoperative/post inflammatory fistula versus Recurrent ulceration. No evidence of abscess. Vascular/Lymphatic: No significant vascular findings are present. No enlarged abdominal or pelvic lymph nodes. Reproductive: Enlarged lobular uterus consistent with multiple fibroids, stable. No adnexal masses. Other: Trace free fluid and punctate focus of extraluminal gas in the right upper quadrant as  above. No evidence of abscess. Postsurgical changes from previous midline laparotomy. No abdominal wall hernia.  Musculoskeletal: No acute or destructive bony lesions. Reconstructed images demonstrate no additional findings. IMPRESSION: 1. Trace free fluid in the right upper quadrant surrounding the gastric antrum/pylorus, with a small amount of gas tracking into the adjacent sub hepatic space. This is in the region of previous gastric ulcer repair and postoperative fluid collection. Differential diagnosis would include postoperative/post inflammatory fistula versus Recurrent ulceration. Endoscopy may be useful for further evaluation. 2. Cholelithiasis with no evidence of cholecystitis. 3. Stable cyst in the head of the pancreas, consistent with sequela of previous pancreatitis. 4. Stable bilateral renal cysts. 5. Diverticulosis without diverticulitis. 6. Fibroid uterus. These results were called by telephone at the time of interpretation on 07/05/2020 at 9:51pm to provider DR LONG, who verbally acknowledged these results. Electronically Signed   By: Randa Ngo M.D.   On: 07/05/2020 21:56  [2 weeks]   Assessment:  64 year old female with history of perforated prepyloric gastric ulcer (chronic NSAID use) in August 2021 status post exploratory laparotomy with gastrorrhaphy Phillip Heal plication) complicated by numerous abdominal abscesses and saddle pulmonary embolus on Xarelto who presents with abdominal pain, nausea and vomiting. CT with questionable fistulous formation versus ulceration.  Findings felt to be postsurgical.  EGD this admission showed healed ulcer at the gastric antrum and in the prepyloric region of the stomach.  Deformed but patent pylorus.  2 nonbleeding cratered duodenal ulcers with no stigmata of bleeding.  Largest measured 15 mm.  Suture noted in the middle of ulcer along the posterior wall.  Noncritical narrowing at the angle of duodenum.  Stable cystic structure in the pancreatic head measuring 2.8 cm with associated coarse calcifications, likely representing residual pseudocyst after previous  pancreatitis.  Prior MRI abdomen August 2021 showed multicystic fluid signal lesion in the anterior pancreatic head measuring 4.1 x 2.6 x 3 cm.  Multiple associated small calcifications.  Distinct from the pancreatic duct and there is no pancreatic ductal dilation.  No solid component.  Favor pancreatic pseudocyst although mucinous cystic neoplasm also considered.  Given size of lesion consider EUS.   Plan: 1. Hold anticoagulation for at least 48 hours post procedure.  Watch closely for rebleed. 2. Continue IV PPI for now. 3. Follow-up H. pylori serologies. 4. Repeat EGD in 12 weeks. 5. No NSAIDs. 6. Follow-up pancreatic cystic lesions with an outpatient.  Laureen Ochs. Bernarda Caffey [facility] [phone] 11/18/20214:04 PM  Attending note: Patient seen and examined.  She tells me that she continues postop to take a combination of Tylenol and either Advil or naproxen on a regular basis for musculoskeletal pain as prescribed by her orthopedist.   I admonished patient not to take any NSAIDs for the foreseeable future.  Tylenol alone okay.  Further NSAID use puts her at risk for more complications related to peptic ulcer disease.  We will advance diet.    LOS: 5 days

## 2020-07-12 ENCOUNTER — Telehealth: Payer: Self-pay | Admitting: Nurse Practitioner

## 2020-07-12 DIAGNOSIS — R1010 Upper abdominal pain, unspecified: Secondary | ICD-10-CM | POA: Diagnosis not present

## 2020-07-12 LAB — CBC
HCT: 31.3 % — ABNORMAL LOW (ref 36.0–46.0)
Hemoglobin: 10.1 g/dL — ABNORMAL LOW (ref 12.0–15.0)
MCH: 29.1 pg (ref 26.0–34.0)
MCHC: 32.3 g/dL (ref 30.0–36.0)
MCV: 90.2 fL (ref 80.0–100.0)
Platelets: 207 10*3/uL (ref 150–400)
RBC: 3.47 MIL/uL — ABNORMAL LOW (ref 3.87–5.11)
RDW: 14.6 % (ref 11.5–15.5)
WBC: 4.7 10*3/uL (ref 4.0–10.5)
nRBC: 0 % (ref 0.0–0.2)

## 2020-07-12 LAB — CULTURE, BLOOD (ROUTINE X 2)
Culture: NO GROWTH
Culture: NO GROWTH
Special Requests: ADEQUATE
Special Requests: ADEQUATE

## 2020-07-12 LAB — H. PYLORI ANTIBODY, IGG: H Pylori IgG: 0.18 Index Value (ref 0.00–0.79)

## 2020-07-12 MED ORDER — SUCRALFATE 1 GM/10ML PO SUSP
1.0000 g | Freq: Three times a day (TID) | ORAL | 0 refills | Status: DC
Start: 2020-07-12 — End: 2020-07-24

## 2020-07-12 MED ORDER — RIVAROXABAN (XARELTO) VTE STARTER PACK (15 & 20 MG): ORAL_TABLET | ORAL | 0 refills | Status: DC

## 2020-07-12 MED ORDER — DICYCLOMINE HCL 10 MG PO CAPS: 10.0000 mg | ORAL_CAPSULE | Freq: Two times a day (BID) | ORAL | 0 refills | Status: DC

## 2020-07-12 MED ORDER — DICLOFENAC SODIUM 1 % EX GEL: 2.0000 g | Freq: Four times a day (QID) | CUTANEOUS | 1 refills | Status: DC

## 2020-07-12 NOTE — Progress Notes (Signed)
Discussed with Dr. Manuella Ghazi (hospitalist) who is planning to d/c today. Patient with no significant frop in hgb overnight. Had some mild cramping with breakfast, but self-resolved. No ongoing abdominal pain. Wants to go home.  1. Ok to d/c from GI perspective. 2. Can restart Xarelto 3. Call GI or come to ED for obvious rebleed 4. WIll plan for outpatient GI follow-up   We will sign off for now, will remove consult order. Please contact us and enter a new consult order if we can be of further assistance.   Thank you for allowing [place] to participate in the care of [facility]  Walden Field, DNP, AGNP-C Adult & Gerontological Nurse Practitioner [facility]

## 2020-07-12 NOTE — Plan of Care (Signed)

## 2020-07-12 NOTE — Telephone Encounter (Signed)
Please schedule hospital follow-up in 6-8 weeks. Thanks!

## 2020-07-12 NOTE — Discharge Summary (Signed)
Physician Discharge Summary  Nickie Warwick BTD:[id number] DOB: Oct 26, 1955 DOA: 07/05/2020  PCP: Patient, No Pcp Per  Admit date: 07/05/2020  Discharge date: 07/12/2020  Admitted From:Home  Disposition:  Home  Recommendations for Outpatient Follow-up:  1. Follow up with PCP in 1-2 weeks 2. Return to GI office or ED if any overt bleeding noted 3. Continue PPI twice daily as prescribed 4. Okay to resume Xarelto per GI recommendations 5. Continue on Bentyl and sucralfate as prescribed 6. Avoid NSAIDs as recommended and remain on Tylenol as needed for pain and use Voltaren gel as prescribed 7. GI to arrange follow-up for pancreatic cystic lesions as well as H. pylori serologies  Home Health: None  Equipment/Devices: None  Discharge Condition: Stable  CODE STATUS: Full  Diet recommendation: Heart Healthy  Brief/Interim Summary: 64 year old female with recent hospitalization and complicated course involving perforated gastric ulcer treated with operative management subsequent development of abdominal abscess which required drain placement. Hospital course was further complicated by development of saddle pulmonary embolus and she was started on anticoagulation. Patient presents to the hospital with abdominal pain, nausea and vomiting. CT showed possible inflammatory fistula versus recurrent ulcer. GI and general surgery following. Felt her symptoms may be related to gastritis versus residual peptic ulcer disease.   -Patient has undergone further evaluation work-up with upper endoscopy on 11/17 with deep bulbar ulcer noted without any stigmata of bleeding she was maintained on IV PPI twice daily for over 48 hours with no further overt bleeding and has now been tolerating her diet without significant abdominal pain or other concerns noted.  Hemoglobin levels have remained stable.  Per GI, she is okay to transition to oral PPI twice daily and avoid NSAIDs in the future.  She is to remain  on Bentyl and sucralfate as well and may resume her home Xarelto as prior for treatment of her PE.  Patient has had no other acute events or or issues during the course of this admission and is otherwise stable for discharge today.  She has been recommended to call the GI office if she has any bleeding concerns for recommendations or to present to the ED for any acute concerns.   Discharge Diagnoses:  Active Problems:   Peptic ulcer with perforation ([place])   Acute saddle pulmonary embolism without acute cor pulmonale (HCC)   Abdominal pain   Non-intractable vomiting   Duodenal ulcer    Discharge Instructions  Discharge Instructions    Diet - low sodium heart healthy   Complete by: As directed    Increase activity slowly   Complete by: As directed      Allergies as of 07/12/2020      Reactions   Wheat Extract Swelling      Medication List    TAKE these medications   acetaminophen 500 MG tablet Commonly known as: TYLENOL Take 1 tablet (500 mg total) by mouth every 6 (six) hours as needed.   citalopram 20 MG tablet Commonly known as: CELEXA Take 1 tablet (20 mg total) by mouth daily.   diclofenac Sodium 1 % Gel Commonly known as: Voltaren Apply 2 g topically 4 (four) times daily.   dicyclomine 10 MG capsule Commonly known as: BENTYL Take 1 capsule (10 mg total) by mouth 2 (two) times daily before a meal.   donepezil 10 MG tablet Commonly known as: ARICEPT Take 5-10 mg by mouth in the morning and at bedtime.   gabapentin 300 MG capsule Commonly known as: NEURONTIN Take 300 mg  by mouth 2 (two) times daily.   GINGER PO Take 1 tablet by mouth daily.   lisinopril-hydrochlorothiazide 10-12.5 MG tablet Commonly known as: ZESTORETIC Take 1 tablet by mouth daily.   LORazepam 1 MG tablet Commonly known as: ATIVAN Take 1 tablet (1 mg total) by mouth every 6 (six) hours as needed for anxiety.   oxyCODONE 5 MG immediate release tablet Commonly known as: Oxy  IR/ROXICODONE Take 1 tablet (5 mg total) by mouth every 6 (six) hours as needed for moderate pain or severe pain.   pantoprazole 40 MG tablet Commonly known as: PROTONIX Take 1 tablet (40 mg total) by mouth 2 (two) times daily.   rivaroxaban 20 MG Tabs tablet Commonly known as: XARELTO Take 1 tablet (20 mg total) by mouth daily with supper. Start 05/24/20 What changed: Another medication with the same name was removed. Continue taking this medication, and follow the directions you see here.   sucralfate 1 GM/10ML suspension Commonly known as: CARAFATE Take 10 mLs (1 g total) by mouth 4 (four) times daily -  with meals and at bedtime.   traZODone 100 MG tablet Commonly known as: DESYREL Take 200 mg by mouth at bedtime.       Follow-up Information    pcp Follow up in 1 week(s).              Allergies  Allergen Reactions  . Wheat Extract Swelling    Consultations:  GI  General surgery   Procedures/Studies: CT Abdomen Pelvis W Contrast  Result Date: 07/05/2020 CLINICAL DATA:  Right upper and right lower quadrant abdominal pain, vomiting EXAM: CT ABDOMEN AND PELVIS WITH CONTRAST TECHNIQUE: Multidetector CT imaging of the abdomen and pelvis was performed using the standard protocol following bolus administration of intravenous contrast. CONTRAST:  129mL OMNIPAQUE IOHEXOL 300 MG/ML  SOLN COMPARISON:  05/13/2020 FINDINGS: Lower chest: No acute pleural or parenchymal lung disease. Bibasilar hypoventilatory changes. Hepatobiliary: Multiple calcified gallstones again identified without cholecystitis. The liver is unremarkable. Pancreas: Stable cystic structure in the pancreatic head measuring 2.8 cm, with associated coarse calcifications, likely representing residual pseudocyst after previous pancreatitis. There are no acute inflammatory changes. Spleen: Normal in size without focal abnormality. Adrenals/Urinary Tract: Adrenal glands are unremarkable. There are numerous bilateral  peripelvic and cortical cysts within the kidneys. Complex peripherally calcified cyst upper pole right kidney measures 2 cm. These have been previously characterized by MRI and shown to demonstrate benign characteristics. The bladder is unremarkable. Stomach/Bowel: No bowel obstruction or ileus. Diverticulosis of the descending and sigmoid colon without diverticulitis. Normal appendix in the right lower quadrant. There is a small amount of free fluid in the right upper quadrant adjacent to the gastric antrum/pylorus. A small tract containing a punctate focus of gas extends from the gastric wall toward the porta hepatis, reference image 26 series 2 and image 34 series 5. Differential diagnosis would be postoperative/post inflammatory fistula versus Recurrent ulceration. No evidence of abscess. Vascular/Lymphatic: No significant vascular findings are present. No enlarged abdominal or pelvic lymph nodes. Reproductive: Enlarged lobular uterus consistent with multiple fibroids, stable. No adnexal masses. Other: Trace free fluid and punctate focus of extraluminal gas in the right upper quadrant as above. No evidence of abscess. Postsurgical changes from previous midline laparotomy. No abdominal wall hernia. Musculoskeletal: No acute or destructive bony lesions. Reconstructed images demonstrate no additional findings. IMPRESSION: 1. Trace free fluid in the right upper quadrant surrounding the gastric antrum/pylorus, with a small amount of gas tracking into the adjacent sub  hepatic space. This is in the region of previous gastric ulcer repair and postoperative fluid collection. Differential diagnosis would include postoperative/post inflammatory fistula versus Recurrent ulceration. Endoscopy may be useful for further evaluation. 2. Cholelithiasis with no evidence of cholecystitis. 3. Stable cyst in the head of the pancreas, consistent with sequela of previous pancreatitis. 4. Stable bilateral renal cysts. 5. Diverticulosis  without diverticulitis. 6. Fibroid uterus. These results were called by telephone at the time of interpretation on 07/05/2020 at 9:51pm to provider DR LONG, who verbally acknowledged these results. Electronically Signed   By: Randa Ngo M.D.   On: 07/05/2020 21:56     Discharge Exam: Vitals:   07/11/20 2035 07/12/20 0526  BP: 124/66 (!) 158/73  Pulse: 70 (!) 51  Resp: 15 16  Temp: 97.7 F (36.5 C) (!) 96.8 F (36 C)  SpO2: 96% 98%   Vitals:   07/11/20 0558 07/11/20 1338 07/11/20 2035 07/12/20 0526  BP: 139/73 131/73 124/66 (!) 158/73  Pulse: (!) 59 72 70 (!) 51  Resp: 18 20 15 16   Temp: (!) 97.4 F (36.3 C) 98.1 F (36.7 C) 97.7 F (36.5 C) (!) 96.8 F (36 C)  TempSrc: Oral Oral    SpO2: 97%  96% 98%  Weight:      Height:        General: Pt is alert, awake, not in acute distress Cardiovascular: RRR, S1/S2 +, no rubs, no gallops Respiratory: CTA bilaterally, no wheezing, no rhonchi Abdominal: Soft, NT, ND, bowel sounds + Extremities: no edema, no cyanosis    The results of significant diagnostics from this hospitalization (including imaging, microbiology, ancillary and laboratory) are listed below for reference.     Microbiology: Recent Results (from the past 240 hour(s))  Respiratory Panel by RT PCR (Flu A&B, Covid) - Nasopharyngeal Swab     Status: None   Collection Time: 07/05/20 11:27 PM   Specimen: Nasopharyngeal Swab  Result Value Ref Range Status   SARS Coronavirus 2 by RT PCR NEGATIVE NEGATIVE Final   Influenza A by PCR NEGATIVE NEGATIVE Final   Influenza B by PCR NEGATIVE NEGATIVE Final    Comment: Performed at [facility], [address], [place], [place] [address]  Culture, blood (routine x 2)     Status: None   Collection Time: 07/06/20 12:59 AM   Specimen: BLOOD LEFT HAND  Result Value Ref Range Status   Specimen Description BLOOD LEFT HAND  Final   Special Requests   Final    BOTTLES DRAWN AEROBIC AND ANAEROBIC Blood Culture adequate volume    Culture   Final    NO GROWTH 6 DAYS Performed at [facility], [address] [place], [place] [address]    Report Status 07/12/2020 FINAL  Final  Culture, blood (routine x 2)     Status: None   Collection Time: 07/06/20  1:05 AM   Specimen: Right Antecubital; Blood  Result Value Ref Range Status   Specimen Description RIGHT ANTECUBITAL  Final   Special Requests   Final    BOTTLES DRAWN AEROBIC AND ANAEROBIC Blood Culture adequate volume   Culture   Final    NO GROWTH 6 DAYS Performed at [facility], [address] [place], [place] [address]    Report Status 07/12/2020 FINAL  Final     Labs: BNP (last 3 results) No results for input(s): BNP in the last 8760 hours. Basic Metabolic Panel: Recent Labs  Lab 07/05/20 1554 07/06/20 0538 07/09/20 1338 07/11/20 0503  NA  138 136 137 137  K 3.8 3.6 3.6 3.7  CL 103 106 104 104  CO2 26 23 25 25   GLUCOSE 121* 109* 109* 99  BUN 9 10 8 10   CREATININE 0.71 0.65 0.76 0.73  CALCIUM 9.2 8.3* 8.9 8.8*  MG  --  1.8  --   --    Liver Function Tests: Recent Labs  Lab 07/05/20 1554 07/06/20 0538 07/09/20 1338  AST 17 17 21   ALT 10 10 10   ALKPHOS 54 47 54  BILITOT 0.7 0.8 0.5  PROT 7.3 6.2* 6.3*  ALBUMIN 4.2 3.6 3.7   Recent Labs  Lab 07/05/20 1554  LIPASE 30   No results for input(s): AMMONIA in the last 168 hours. CBC: Recent Labs  Lab 07/05/20 1554 07/06/20 0538 07/09/20 1338 07/11/20 0503 07/12/20 0610  WBC 6.6 7.7 3.8* 5.2 4.7  NEUTROABS  --  6.2 2.3  --   --   HGB 12.1 11.1* 11.1* 10.5* 10.1*  HCT 37.4 35.6* 34.3* 32.2* 31.3*  MCV 89.9 93.2 89.3 88.7 90.2  PLT 210 182 199 201 207   Cardiac Enzymes: No results for input(s): CKTOTAL, CKMB, CKMBINDEX, TROPONINI in the last 168 hours. BNP: Invalid input(s): POCBNP CBG: No results for input(s): GLUCAP in the last 168 hours. D-Dimer No results for input(s): DDIMER in the last 72 hours. Hgb A1c No results for input(s): HGBA1C in the last 72 hours. Lipid  Profile No results for input(s): CHOL, HDL, LDLCALC, TRIG, CHOLHDL, LDLDIRECT in the last 72 hours. Thyroid function studies No results for input(s): TSH, T4TOTAL, T3FREE, THYROIDAB in the last 72 hours.  Invalid input(s): FREET3 Anemia work up No results for input(s): VITAMINB12, FOLATE, FERRITIN, TIBC, IRON, RETICCTPCT in the last 72 hours. Urinalysis    Component Value Date/Time   COLORURINE YELLOW 07/05/2020 1503   APPEARANCEUR CLEAR 07/05/2020 1503   LABSPEC 1.013 07/05/2020 1503   PHURINE 6.0 07/05/2020 1503   GLUCOSEU NEGATIVE 07/05/2020 1503   HGBUR NEGATIVE 07/05/2020 1503   BILIRUBINUR NEGATIVE 07/05/2020 1503   KETONESUR 20 (A) 07/05/2020 1503   PROTEINUR NEGATIVE 07/05/2020 1503   NITRITE NEGATIVE 07/05/2020 1503   LEUKOCYTESUR NEGATIVE 07/05/2020 1503   Sepsis Labs Invalid input(s): PROCALCITONIN,  WBC,  LACTICIDVEN Microbiology Recent Results (from the past 240 hour(s))  Respiratory Panel by RT PCR (Flu A&B, Covid) - Nasopharyngeal Swab     Status: None   Collection Time: 07/05/20 11:27 PM   Specimen: Nasopharyngeal Swab  Result Value Ref Range Status   SARS Coronavirus 2 by RT PCR NEGATIVE NEGATIVE Final   Influenza A by PCR NEGATIVE NEGATIVE Final   Influenza B by PCR NEGATIVE NEGATIVE Final    Comment: Performed at [facility], [address], [place], [place] [address]  Culture, blood (routine x 2)     Status: None   Collection Time: 07/06/20 12:59 AM   Specimen: BLOOD LEFT HAND  Result Value Ref Range Status   Specimen Description BLOOD LEFT HAND  Final   Special Requests   Final    BOTTLES DRAWN AEROBIC AND ANAEROBIC Blood Culture adequate volume   Culture   Final    NO GROWTH 6 DAYS Performed at [facility], [address], [place], [place] [address]    Report Status 07/12/2020 FINAL  Final  Culture, blood (routine x 2)     Status: None   Collection Time: 07/06/20  1:05 AM   Specimen: Right Antecubital; Blood  Result Value Ref Range Status  Specimen Description RIGHT ANTECUBITAL  Final   Special Requests   Final    BOTTLES DRAWN AEROBIC AND ANAEROBIC Blood Culture adequate volume   Culture   Final    NO GROWTH 6 DAYS Performed at [facility], [address], [place], [place] [address]    Report Status 07/12/2020 FINAL  Final     Time coordinating discharge: 35 minutes  SIGNED:   Rodena Goldmann, DO [facility] 07/12/2020, 11:33 AM  If 7PM-7AM, please contact night-coverage [email or web address]

## 2020-07-15 ENCOUNTER — Telehealth: Payer: Self-pay

## 2020-07-15 ENCOUNTER — Encounter (HOSPITAL_COMMUNITY): Payer: Self-pay | Admitting: Internal Medicine

## 2020-07-15 ENCOUNTER — Encounter: Payer: Self-pay | Admitting: Nurse Practitioner

## 2020-07-15 NOTE — Telephone Encounter (Signed)
Transition Care Management Follow-up Telephone Call  Date of discharge and from where: 07/12/2020, [facility]  How have you been since you were released from the hospital? She said she is feeling " reasonably good." she is worried about sudden pains in her stomach. She said she had them in the hospital and continues to have them intermittently at home but hopes in time they will subside. She denied any bleeding and said that she has all of her medications, including the new ones,  and is taking them as ordered.   Any questions or concerns? Yes,noted above  Items Reviewed:  Did the pt receive and understand the discharge instructions provided? Yes   Medications obtained and verified? Yes  - she did not have any questions about her med regime.   Other? No   Any new allergies since your discharge? No   Do you have support at home? Yes  - she lives alone but her sister checks on her when she can.   Home Care and Equipment/Supplies: Were home health services ordered? no If so, what is the name of the agency? n/a Has the agency set up a time to come to the patient's home?n/a Were any new equipment or medical supplies ordered?  No What is the name of the medical supply agency?n/a Were you able to get the supplies/equipment? N/a Do you have any questions related to the use of the equipment or supplies? No, n/a  Functional Questionnaire: (I = Independent and D = Dependent) ADLs:independent. Has walker to use with ambulation  Follow up appointments reviewed:   PCP Hospital f/u appt confirmed?   She does not have a PCP yet.  Hospital follow up appointment with Dr Margarita Rana 07/30/2020  [facility] f/u appt confirmed? GI - 09/06/2019   Are transportation arrangements needed? No   If their condition worsens, is the pt aware to call PCP or go to the Emergency Dept.? yes  Was the patient provided with contact information for the PCP's office or ED? She has the clinic phone  number  Was to pt encouraged to call back with questions or concerns? yes

## 2020-07-15 NOTE — Telephone Encounter (Signed)
Ov made and letter mailed

## 2020-07-15 NOTE — Telephone Encounter (Signed)
From the discharge call:      She said she is feeling " reasonably good." she is worried about sudden pains in her stomach. She said she had them in the hospital and continues to have them intermittently at home but hopes in time they will subside. She denied any bleeding and said that she has all of her medications, including the new ones,  and is taking them as ordered.   ADLs:independent. Has walker to use with ambulation   She does not have a PCP yet.  Hospital follow up appointment with Dr Margarita Rana 07/30/2020.  Dr Joya Gaskins is listed as her PCP but she has not seen him.

## 2020-07-15 NOTE — Telephone Encounter (Signed)
Given her recent surgical procedure intermittent abdominal pain is not uncommon.  Please advise to comply with her current regimen.  If a sooner appointment is available with any clinician, this can be arranged.

## 2020-07-16 NOTE — Telephone Encounter (Signed)
Attempted to contact the patient # [id number] to inform her per Dr Margarita Rana :  Given her recent surgical procedure intermittent abdominal pain is not uncommon.  Please advise to comply with her current regimen.  If a sooner appointment is available with any clinician, this can be arranged.   Message left with call back requested to this CM.

## 2020-07-20 ENCOUNTER — Emergency Department (HOSPITAL_COMMUNITY)
Admission: EM | Admit: 2020-07-20 | Discharge: 2020-07-20 | Disposition: A | Discharge status: Home / Self Care | Payer: Medicaid Other | Attending: Emergency Medicine | Admitting: Emergency Medicine

## 2020-07-20 ENCOUNTER — Encounter (HOSPITAL_COMMUNITY): Payer: Self-pay | Admitting: Emergency Medicine

## 2020-07-20 ENCOUNTER — Other Ambulatory Visit: Payer: Self-pay

## 2020-07-20 DIAGNOSIS — R1110 Vomiting, unspecified: Secondary | ICD-10-CM | POA: Diagnosis not present

## 2020-07-20 DIAGNOSIS — Z79899 Other long term (current) drug therapy: Secondary | ICD-10-CM | POA: Insufficient documentation

## 2020-07-20 DIAGNOSIS — R112 Nausea with vomiting, unspecified: Secondary | ICD-10-CM

## 2020-07-20 DIAGNOSIS — R197 Diarrhea, unspecified: Secondary | ICD-10-CM | POA: Insufficient documentation

## 2020-07-20 DIAGNOSIS — I10 Essential (primary) hypertension: Secondary | ICD-10-CM | POA: Insufficient documentation

## 2020-07-20 DIAGNOSIS — R1010 Upper abdominal pain, unspecified: Secondary | ICD-10-CM | POA: Insufficient documentation

## 2020-07-20 DIAGNOSIS — Z85828 Personal history of other malignant neoplasm of skin: Secondary | ICD-10-CM | POA: Diagnosis not present

## 2020-07-20 LAB — COMPREHENSIVE METABOLIC PANEL
ALT: 8 U/L (ref 0–44)
AST: 18 U/L (ref 15–41)
Albumin: 4 g/dL (ref 3.5–5.0)
Alkaline Phosphatase: 50 U/L (ref 38–126)
Anion gap: 12 (ref 5–15)
BUN: 6 mg/dL — ABNORMAL LOW (ref 8–23)
CO2: 20 mmol/L — ABNORMAL LOW (ref 22–32)
Calcium: 9.4 mg/dL (ref 8.9–10.3)
Chloride: 107 mmol/L (ref 98–111)
Creatinine, Ser: 0.8 mg/dL (ref 0.44–1.00)
GFR, Estimated: >60 mL/min (ref >60)
Glucose, Bld: 104 mg/dL — ABNORMAL HIGH (ref 70–99)
Potassium: 3.6 mmol/L (ref 3.5–5.1)
Sodium: 139 mmol/L (ref 135–145)
Total Bilirubin: 0.4 mg/dL (ref 0.3–1.2)
Total Protein: 6.6 g/dL (ref 6.5–8.1)

## 2020-07-20 LAB — URINALYSIS, ROUTINE W REFLEX MICROSCOPIC
Bacteria, UA: NONE SEEN
Bilirubin Urine: NEGATIVE
Glucose, UA: NEGATIVE mg/dL
Hgb urine dipstick: NEGATIVE
Ketones, ur: 20 mg/dL — AB
Nitrite: NEGATIVE
Protein, ur: NEGATIVE mg/dL
Specific Gravity, Urine: 1.018 (ref 1.005–1.030)
pH: 5 (ref 5.0–8.0)

## 2020-07-20 LAB — CBC
HCT: 36.7 % (ref 36.0–46.0)
Hemoglobin: 11.6 g/dL — ABNORMAL LOW (ref 12.0–15.0)
MCH: 28.5 pg (ref 26.0–34.0)
MCHC: 31.6 g/dL (ref 30.0–36.0)
MCV: 90.2 fL (ref 80.0–100.0)
Platelets: 192 10*3/uL (ref 150–400)
RBC: 4.07 MIL/uL (ref 3.87–5.11)
RDW: 13.9 % (ref 11.5–15.5)
WBC: 6.9 10*3/uL (ref 4.0–10.5)
nRBC: 0 % (ref 0.0–0.2)

## 2020-07-20 LAB — C DIFFICILE QUICK SCREEN W PCR REFLEX
C Diff antigen: NEGATIVE
C Diff interpretation: NOT DETECTED
C Diff toxin: NEGATIVE

## 2020-07-20 LAB — LIPASE, BLOOD: Lipase: 43 U/L (ref 11–51)

## 2020-07-20 MED ORDER — SODIUM CHLORIDE 0.9 % IV BOLUS
2000.0000 mL | Freq: Once | INTRAVENOUS | Status: AC
  Administered 2020-07-20: 2000 mL via INTRAVENOUS

## 2020-07-20 MED ORDER — LOPERAMIDE HCL 2 MG PO CAPS
4.0000 mg | ORAL_CAPSULE | ORAL | Status: DC | PRN
  Administered 2020-07-20: 4 mg via ORAL
  Filled 2020-07-20: qty 2

## 2020-07-20 MED ORDER — KETAMINE HCL 50 MG/5ML IJ SOSY
PREFILLED_SYRINGE | INTRAMUSCULAR | Status: AC
  Filled 2020-07-20: qty 5

## 2020-07-20 MED ORDER — ONDANSETRON HCL 4 MG/2ML IJ SOLN
4.0000 mg | Freq: Once | INTRAMUSCULAR | Status: AC
  Administered 2020-07-20: 4 mg via INTRAVENOUS
  Filled 2020-07-20: qty 2

## 2020-07-20 NOTE — ED Triage Notes (Signed)
C/o upper abd cramps, nausea, vomiting, and diarrhea since Wednesday.  Reports dizziness since yesterday.
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
## 2020-07-20 NOTE — Discharge Instructions (Signed)
Make sure you are drinking plenty of fluids.  Use loperamide, 4 mg, 3-4 times a day as needed for diarrhea.  This is available over-the-counter at the pharmacy.  Use MyChart, or ask your PCP to check on the C. difficile result and other stool tests that were done tonight.  Return here, if needed, for problems.

## 2020-07-21 LAB — GASTROINTESTINAL PANEL BY PCR, STOOL (REPLACES STOOL CULTURE)

## 2020-07-23 ENCOUNTER — Encounter: Payer: Self-pay | Admitting: Internal Medicine

## 2020-07-23 ENCOUNTER — Ambulatory Visit: Payer: Medicaid Other | Attending: Internal Medicine | Admitting: Internal Medicine

## 2020-07-23 ENCOUNTER — Other Ambulatory Visit: Payer: Self-pay

## 2020-07-23 VITALS — BP 118/78 | HR 60 | Temp 97.8°F | Wt 185.3 lb

## 2020-07-23 DIAGNOSIS — Z79899 Other long term (current) drug therapy: Secondary | ICD-10-CM | POA: Diagnosis not present

## 2020-07-23 DIAGNOSIS — R413 Other amnesia: Secondary | ICD-10-CM

## 2020-07-23 DIAGNOSIS — Z8249 Family history of ischemic heart disease and other diseases of the circulatory system: Secondary | ICD-10-CM | POA: Insufficient documentation

## 2020-07-23 DIAGNOSIS — F329 Major depressive disorder, single episode, unspecified: Secondary | ICD-10-CM | POA: Diagnosis not present

## 2020-07-23 DIAGNOSIS — Z7901 Long term (current) use of anticoagulants: Secondary | ICD-10-CM | POA: Insufficient documentation

## 2020-07-23 DIAGNOSIS — I2602 Saddle embolus of pulmonary artery with acute cor pulmonale: Secondary | ICD-10-CM | POA: Insufficient documentation

## 2020-07-23 DIAGNOSIS — A09 Infectious gastroenteritis and colitis, unspecified: Secondary | ICD-10-CM | POA: Diagnosis not present

## 2020-07-23 DIAGNOSIS — M170 Bilateral primary osteoarthritis of knee: Secondary | ICD-10-CM | POA: Insufficient documentation

## 2020-07-23 DIAGNOSIS — Z09 Encounter for follow-up examination after completed treatment for conditions other than malignant neoplasm: Secondary | ICD-10-CM

## 2020-07-23 DIAGNOSIS — Z8659 Personal history of other mental and behavioral disorders: Secondary | ICD-10-CM

## 2020-07-23 DIAGNOSIS — I10 Essential (primary) hypertension: Secondary | ICD-10-CM | POA: Diagnosis not present

## 2020-07-23 DIAGNOSIS — K279 Peptic ulcer, site unspecified, unspecified as acute or chronic, without hemorrhage or perforation: Secondary | ICD-10-CM

## 2020-07-23 DIAGNOSIS — K269 Duodenal ulcer, unspecified as acute or chronic, without hemorrhage or perforation: Secondary | ICD-10-CM | POA: Diagnosis present

## 2020-07-23 DIAGNOSIS — I2692 Saddle embolus of pulmonary artery without acute cor pulmonale: Secondary | ICD-10-CM

## 2020-07-23 MED ORDER — CIPROFLOXACIN HCL 500 MG PO TABS: 500.0000 mg | ORAL_TABLET | Freq: Two times a day (BID) | ORAL | 0 refills | Status: DC

## 2020-07-23 NOTE — Patient Instructions (Signed)
Please keep your appointment with the gastroenterologist.  I will have our referral person try to move up the appointment.  Please report any blood in the stools, urine or from the nose.  I will send a prescription to your pharmacy for some antibiotics as we discussed today.

## 2020-07-23 NOTE — Progress Notes (Signed)
Stomach still cramping occasionally Loose stools immediately after meals Needs refill on protonix Balance issues
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
## 2020-07-24 ENCOUNTER — Encounter: Payer: Self-pay | Admitting: Internal Medicine

## 2020-07-24 ENCOUNTER — Telehealth: Payer: Self-pay | Admitting: Internal Medicine

## 2020-07-24 DIAGNOSIS — Z8659 Personal history of other mental and behavioral disorders: Secondary | ICD-10-CM | POA: Insufficient documentation

## 2020-07-24 DIAGNOSIS — M170 Bilateral primary osteoarthritis of knee: Secondary | ICD-10-CM | POA: Insufficient documentation

## 2020-07-24 MED ORDER — SUCRALFATE 1 GM/10ML PO SUSP: 1.0000 g | Freq: Three times a day (TID) | ORAL | 0 refills | Status: DC

## 2020-07-24 MED ORDER — RIVAROXABAN 20 MG PO TABS: 20.0000 mg | ORAL_TABLET | Freq: Every day | ORAL | 0 refills | Status: DC

## 2020-07-24 MED ORDER — PANTOPRAZOLE SODIUM 40 MG PO TBEC: 40.0000 mg | DELAYED_RELEASE_TABLET | Freq: Two times a day (BID) | ORAL | 0 refills | Status: DC

## 2020-07-24 MED ORDER — LISINOPRIL-HYDROCHLOROTHIAZIDE 10-12.5 MG PO TABS: 1.0000 | ORAL_TABLET | Freq: Every day | ORAL | 1 refills | Status: DC

## 2020-07-24 NOTE — Telephone Encounter (Signed)
Phone call placed to patient today.  I left a voicemail message reminding her to pick up the antibiotics that I prescribed for her for the diarrhea.

## 2020-07-29 ENCOUNTER — Telehealth: Payer: Self-pay

## 2020-07-29 NOTE — Telephone Encounter (Signed)
Call received from patient.  Her sister who has been providing assistance with housekeeping and personal care is not going to be able to provide the same amount of assistance going forward. She is requesting a PCS referral.   Informed her that PCP will be notified of the request.

## 2020-07-30 ENCOUNTER — Ambulatory Visit: Payer: Medicaid Other | Admitting: Family Medicine

## 2020-08-01 NOTE — Telephone Encounter (Signed)
Call placed to patient # [phone] to further discuss her needs for personal care assistance. Message left with call back requested to this CM

## 2020-08-06 ENCOUNTER — Other Ambulatory Visit: Payer: Self-pay

## 2020-08-06 DIAGNOSIS — R1011 Right upper quadrant pain: Secondary | ICD-10-CM | POA: Insufficient documentation

## 2020-08-06 DIAGNOSIS — R112 Nausea with vomiting, unspecified: Secondary | ICD-10-CM | POA: Diagnosis present

## 2020-08-06 DIAGNOSIS — R45851 Suicidal ideations: Secondary | ICD-10-CM | POA: Insufficient documentation

## 2020-08-06 DIAGNOSIS — Z7901 Long term (current) use of anticoagulants: Secondary | ICD-10-CM | POA: Insufficient documentation

## 2020-08-06 DIAGNOSIS — F419 Anxiety disorder, unspecified: Secondary | ICD-10-CM | POA: Diagnosis not present

## 2020-08-06 DIAGNOSIS — F332 Major depressive disorder, recurrent severe without psychotic features: Secondary | ICD-10-CM | POA: Diagnosis not present

## 2020-08-06 DIAGNOSIS — R600 Localized edema: Secondary | ICD-10-CM | POA: Insufficient documentation

## 2020-08-06 DIAGNOSIS — I10 Essential (primary) hypertension: Secondary | ICD-10-CM | POA: Insufficient documentation

## 2020-08-06 DIAGNOSIS — Z85828 Personal history of other malignant neoplasm of skin: Secondary | ICD-10-CM | POA: Insufficient documentation

## 2020-08-06 DIAGNOSIS — Z20822 Contact with and (suspected) exposure to covid-19: Secondary | ICD-10-CM | POA: Insufficient documentation

## 2020-08-06 DIAGNOSIS — Z79899 Other long term (current) drug therapy: Secondary | ICD-10-CM | POA: Insufficient documentation

## 2020-08-07 ENCOUNTER — Ambulatory Visit: Payer: Medicaid Other | Attending: Critical Care Medicine | Admitting: Critical Care Medicine

## 2020-08-07 ENCOUNTER — Emergency Department (HOSPITAL_COMMUNITY)
Admission: EM | Admit: 2020-08-07 | Discharge: 2020-08-07 | Disposition: A | Discharge status: Home / Self Care | Payer: Medicaid Other | Attending: Emergency Medicine | Admitting: Emergency Medicine

## 2020-08-07 ENCOUNTER — Encounter: Payer: Self-pay | Admitting: Critical Care Medicine

## 2020-08-07 ENCOUNTER — Emergency Department (HOSPITAL_COMMUNITY): Payer: Medicaid Other

## 2020-08-07 ENCOUNTER — Encounter (HOSPITAL_COMMUNITY): Payer: Self-pay

## 2020-08-07 ENCOUNTER — Encounter (HOSPITAL_COMMUNITY): Payer: Self-pay | Admitting: Emergency Medicine

## 2020-08-07 ENCOUNTER — Emergency Department (HOSPITAL_COMMUNITY)
Admission: EM | Admit: 2020-08-07 | Discharge: 2020-08-08 | Disposition: A | Discharge status: Other Acute Inpatient Hospital | Payer: Medicaid Other | Source: Home / Self Care | Attending: Emergency Medicine | Admitting: Emergency Medicine

## 2020-08-07 ENCOUNTER — Other Ambulatory Visit: Payer: Self-pay

## 2020-08-07 VITALS — BP 164/79 | HR 65 | Temp 98.1°F | Resp 20 | Ht 64.0 in | Wt 192.8 lb

## 2020-08-07 DIAGNOSIS — R45851 Suicidal ideations: Secondary | ICD-10-CM

## 2020-08-07 DIAGNOSIS — I2699 Other pulmonary embolism without acute cor pulmonale: Secondary | ICD-10-CM | POA: Insufficient documentation

## 2020-08-07 DIAGNOSIS — R112 Nausea with vomiting, unspecified: Secondary | ICD-10-CM

## 2020-08-07 DIAGNOSIS — Z79899 Other long term (current) drug therapy: Secondary | ICD-10-CM | POA: Diagnosis not present

## 2020-08-07 DIAGNOSIS — I10 Essential (primary) hypertension: Secondary | ICD-10-CM | POA: Insufficient documentation

## 2020-08-07 DIAGNOSIS — F332 Major depressive disorder, recurrent severe without psychotic features: Secondary | ICD-10-CM

## 2020-08-07 DIAGNOSIS — R109 Unspecified abdominal pain: Secondary | ICD-10-CM

## 2020-08-07 DIAGNOSIS — Z85828 Personal history of other malignant neoplasm of skin: Secondary | ICD-10-CM | POA: Insufficient documentation

## 2020-08-07 DIAGNOSIS — Z791 Long term (current) use of non-steroidal anti-inflammatories (NSAID): Secondary | ICD-10-CM | POA: Diagnosis not present

## 2020-08-07 DIAGNOSIS — K275 Chronic or unspecified peptic ulcer, site unspecified, with perforation: Secondary | ICD-10-CM | POA: Insufficient documentation

## 2020-08-07 DIAGNOSIS — Z7901 Long term (current) use of anticoagulants: Secondary | ICD-10-CM | POA: Insufficient documentation

## 2020-08-07 DIAGNOSIS — R1011 Right upper quadrant pain: Secondary | ICD-10-CM

## 2020-08-07 DIAGNOSIS — K862 Cyst of pancreas: Secondary | ICD-10-CM | POA: Diagnosis not present

## 2020-08-07 DIAGNOSIS — R1010 Upper abdominal pain, unspecified: Secondary | ICD-10-CM

## 2020-08-07 DIAGNOSIS — R509 Fever, unspecified: Secondary | ICD-10-CM | POA: Insufficient documentation

## 2020-08-07 DIAGNOSIS — R1013 Epigastric pain: Secondary | ICD-10-CM

## 2020-08-07 DIAGNOSIS — K269 Duodenal ulcer, unspecified as acute or chronic, without hemorrhage or perforation: Secondary | ICD-10-CM | POA: Insufficient documentation

## 2020-08-07 DIAGNOSIS — R600 Localized edema: Secondary | ICD-10-CM | POA: Insufficient documentation

## 2020-08-07 DIAGNOSIS — Z20822 Contact with and (suspected) exposure to covid-19: Secondary | ICD-10-CM | POA: Insufficient documentation

## 2020-08-07 DIAGNOSIS — F419 Anxiety disorder, unspecified: Secondary | ICD-10-CM | POA: Insufficient documentation

## 2020-08-07 DIAGNOSIS — I2692 Saddle embolus of pulmonary artery without acute cor pulmonale: Secondary | ICD-10-CM | POA: Diagnosis not present

## 2020-08-07 HISTORY — DX: Suicidal ideations: R45.851

## 2020-08-07 LAB — COMPREHENSIVE METABOLIC PANEL
ALT: 9 U/L (ref 0–44)
ALT: 11 U/L (ref 0–44)
AST: 18 U/L (ref 15–41)
AST: 21 U/L (ref 15–41)
Albumin: 4.3 g/dL (ref 3.5–5.0)
Albumin: 4.1 g/dL (ref 3.5–5.0)
Alkaline Phosphatase: 48 U/L (ref 38–126)
Alkaline Phosphatase: 51 U/L (ref 38–126)
Anion gap: 9 (ref 5–15)
Anion gap: 11 (ref 5–15)
BUN: 8 mg/dL (ref 8–23)
BUN: 7 mg/dL — ABNORMAL LOW (ref 8–23)
CO2: 23 mmol/L (ref 22–32)
CO2: 24 mmol/L (ref 22–32)
Calcium: 9 mg/dL (ref 8.9–10.3)
Calcium: 9.2 mg/dL (ref 8.9–10.3)
Chloride: 106 mmol/L (ref 98–111)
Chloride: 106 mmol/L (ref 98–111)
Creatinine, Ser: 0.67 mg/dL (ref 0.44–1.00)
Creatinine, Ser: 0.73 mg/dL (ref 0.44–1.00)
GFR, Estimated: >60 mL/min (ref >60)
GFR, Estimated: >60 mL/min (ref >60)
Glucose, Bld: 125 mg/dL — ABNORMAL HIGH (ref 70–99)
Glucose, Bld: 128 mg/dL — ABNORMAL HIGH (ref 70–99)
Potassium: 3.3 mmol/L — ABNORMAL LOW (ref 3.5–5.1)
Potassium: 3.2 mmol/L — ABNORMAL LOW (ref 3.5–5.1)
Sodium: 138 mmol/L (ref 135–145)
Sodium: 141 mmol/L (ref 135–145)
Total Bilirubin: 0.4 mg/dL (ref 0.3–1.2)
Total Bilirubin: 0.8 mg/dL (ref 0.3–1.2)
Total Protein: 7.2 g/dL (ref 6.5–8.1)
Total Protein: 7 g/dL (ref 6.5–8.1)

## 2020-08-07 LAB — RESP PANEL BY RT-PCR (FLU A&B, COVID) ARPGX2
Influenza A by PCR: NEGATIVE
Influenza B by PCR: NEGATIVE
SARS Coronavirus 2 by RT PCR: NEGATIVE

## 2020-08-07 LAB — CBC WITH DIFFERENTIAL/PLATELET
Abs Immature Granulocytes: 0.02 10*3/uL (ref 0.00–0.07)
Basophils Absolute: 0 10*3/uL (ref 0.0–0.1)
Basophils Relative: 0 %
Eosinophils Absolute: 0 10*3/uL (ref 0.0–0.5)
Eosinophils Relative: 0 %
HCT: 32.2 % — ABNORMAL LOW (ref 36.0–46.0)
Hemoglobin: 10.5 g/dL — ABNORMAL LOW (ref 12.0–15.0)
Immature Granulocytes: 0 %
Lymphocytes Relative: 13 %
Lymphs Abs: 0.9 10*3/uL (ref 0.7–4.0)
MCH: 28.8 pg (ref 26.0–34.0)
MCHC: 32.6 g/dL (ref 30.0–36.0)
MCV: 88.2 fL (ref 80.0–100.0)
Monocytes Absolute: 0.4 10*3/uL (ref 0.1–1.0)
Monocytes Relative: 5 %
Neutro Abs: 6.1 10*3/uL (ref 1.7–7.7)
Neutrophils Relative %: 82 %
Platelets: 225 10*3/uL (ref 150–400)
RBC: 3.65 MIL/uL — ABNORMAL LOW (ref 3.87–5.11)
RDW: 14.6 % (ref 11.5–15.5)
WBC: 7.5 10*3/uL (ref 4.0–10.5)
nRBC: 0 % (ref 0.0–0.2)

## 2020-08-07 LAB — SALICYLATE LEVEL: Salicylate Lvl: <7 mg/dL — ABNORMAL LOW (ref 7.0–30.0)

## 2020-08-07 LAB — ETHANOL: Alcohol, Ethyl (B): <10 mg/dL (ref <10)

## 2020-08-07 LAB — CBC
HCT: 33 % — ABNORMAL LOW (ref 36.0–46.0)
Hemoglobin: 10.9 g/dL — ABNORMAL LOW (ref 12.0–15.0)
MCH: 29.2 pg (ref 26.0–34.0)
MCHC: 33 g/dL (ref 30.0–36.0)
MCV: 88.5 fL (ref 80.0–100.0)
Platelets: 225 10*3/uL (ref 150–400)
RBC: 3.73 MIL/uL — ABNORMAL LOW (ref 3.87–5.11)
RDW: 14.6 % (ref 11.5–15.5)
WBC: 8.5 10*3/uL (ref 4.0–10.5)
nRBC: 0 % (ref 0.0–0.2)

## 2020-08-07 LAB — LIPASE, BLOOD
Lipase: 28 U/L (ref 11–51)
Lipase: 28 U/L (ref 11–51)

## 2020-08-07 LAB — RAPID URINE DRUG SCREEN, HOSP PERFORMED
Amphetamines: NOT DETECTED
Barbiturates: NOT DETECTED
Benzodiazepines: NOT DETECTED
Cocaine: NOT DETECTED
Opiates: NOT DETECTED
Tetrahydrocannabinol: NOT DETECTED

## 2020-08-07 LAB — TROPONIN I (HIGH SENSITIVITY): Troponin I (High Sensitivity): 6 ng/L (ref <18)

## 2020-08-07 LAB — ACETAMINOPHEN LEVEL: Acetaminophen (Tylenol), Serum: <10 ug/mL — ABNORMAL LOW (ref 10–30)

## 2020-08-07 MED ORDER — DICYCLOMINE HCL 10 MG/ML IM SOLN
20.0000 mg | Freq: Once | INTRAMUSCULAR | Status: AC
  Administered 2020-08-07: 02:00:00 20 mg via INTRAMUSCULAR
  Filled 2020-08-07: qty 2

## 2020-08-07 MED ORDER — ALUM & MAG HYDROXIDE-SIMETH 200-200-20 MG/5ML PO SUSP: 30.0000 mL | Freq: Four times a day (QID) | ORAL | Status: DC | PRN

## 2020-08-07 MED ORDER — ONDANSETRON 4 MG PO TBDP: 8.0000 mg | ORAL_TABLET | Freq: Three times a day (TID) | ORAL | Status: DC | PRN

## 2020-08-07 MED ORDER — SUCRALFATE 1 GM/10ML PO SUSP
1.0000 g | Freq: Three times a day (TID) | ORAL | Status: DC
  Administered 2020-08-08 (×2): 1 g via ORAL
  Filled 2020-08-07 (×5): qty 10

## 2020-08-07 MED ORDER — ONDANSETRON 8 MG PO TBDP: 8.0000 mg | ORAL_TABLET | Freq: Three times a day (TID) | ORAL | 0 refills | Status: DC | PRN

## 2020-08-07 MED ORDER — GABAPENTIN 300 MG PO CAPS
300.0000 mg | ORAL_CAPSULE | Freq: Two times a day (BID) | ORAL | Status: DC
  Administered 2020-08-08 (×2): 300 mg via ORAL
  Filled 2020-08-07 (×2): qty 1

## 2020-08-07 MED ORDER — SODIUM CHLORIDE 0.9 % IV BOLUS
1000.0000 mL | Freq: Once | INTRAVENOUS | Status: AC
  Administered 2020-08-07: 02:00:00 1000 mL via INTRAVENOUS

## 2020-08-07 MED ORDER — IOHEXOL 300 MG/ML  SOLN
100.0000 mL | Freq: Once | INTRAMUSCULAR | Status: AC | PRN
  Administered 2020-08-07: 03:00:00 100 mL via INTRAVENOUS

## 2020-08-07 MED ORDER — DICYCLOMINE HCL 10 MG PO CAPS
10.0000 mg | ORAL_CAPSULE | Freq: Two times a day (BID) | ORAL | Status: DC
Start: 2020-08-08 — End: 2020-08-08
  Administered 2020-08-08: 09:00:00 10 mg via ORAL
  Filled 2020-08-07: qty 1

## 2020-08-07 MED ORDER — RIVAROXABAN 20 MG PO TABS
20.0000 mg | ORAL_TABLET | Freq: Every day | ORAL | Status: DC
  Filled 2020-08-07: qty 1

## 2020-08-07 MED ORDER — PANTOPRAZOLE SODIUM 40 MG PO TBEC
40.0000 mg | DELAYED_RELEASE_TABLET | Freq: Two times a day (BID) | ORAL | Status: DC
  Administered 2020-08-08 (×2): 40 mg via ORAL
  Filled 2020-08-07 (×2): qty 1

## 2020-08-07 MED ORDER — ONDANSETRON HCL 4 MG PO TABS: 4.0000 mg | ORAL_TABLET | Freq: Three times a day (TID) | ORAL | Status: DC | PRN

## 2020-08-07 MED ORDER — TRAZODONE HCL 100 MG PO TABS
200.0000 mg | ORAL_TABLET | Freq: Every day | ORAL | Status: DC
  Administered 2020-08-08: 01:00:00 200 mg via ORAL
  Filled 2020-08-07: qty 4

## 2020-08-07 MED ORDER — HYDROCHLOROTHIAZIDE 12.5 MG PO CAPS
12.5000 mg | ORAL_CAPSULE | Freq: Every day | ORAL | Status: DC
  Administered 2020-08-08 (×2): 12.5 mg via ORAL
  Filled 2020-08-07 (×2): qty 1

## 2020-08-07 MED ORDER — DONEPEZIL HCL 5 MG PO TABS
10.0000 mg | ORAL_TABLET | Freq: Every day | ORAL | Status: DC
  Administered 2020-08-08: 01:00:00 10 mg via ORAL
  Filled 2020-08-07 (×2): qty 2

## 2020-08-07 MED ORDER — CITALOPRAM HYDROBROMIDE 10 MG PO TABS
20.0000 mg | ORAL_TABLET | Freq: Every day | ORAL | Status: DC
  Administered 2020-08-08 (×2): 20 mg via ORAL
  Filled 2020-08-07 (×2): qty 2

## 2020-08-07 MED ORDER — FENTANYL CITRATE (PF) 100 MCG/2ML IJ SOLN
50.0000 ug | Freq: Once | INTRAMUSCULAR | Status: AC
  Administered 2020-08-07: 03:00:00 50 ug via INTRAVENOUS
  Filled 2020-08-07: qty 2

## 2020-08-07 MED ORDER — LISINOPRIL 10 MG PO TABS
10.0000 mg | ORAL_TABLET | Freq: Every day | ORAL | Status: DC
Start: 2020-08-07 — End: 2020-08-08
  Administered 2020-08-08 (×2): 10 mg via ORAL
  Filled 2020-08-07 (×2): qty 1

## 2020-08-07 MED ORDER — HYDROCODONE-ACETAMINOPHEN 5-325 MG PO TABS
1.0000 | ORAL_TABLET | Freq: Once | ORAL | Status: AC
  Administered 2020-08-07: 19:00:00 1 via ORAL
  Filled 2020-08-07: qty 1

## 2020-08-07 MED ORDER — ONDANSETRON HCL 4 MG/2ML IJ SOLN
4.0000 mg | Freq: Once | INTRAMUSCULAR | Status: AC
  Administered 2020-08-07: 02:00:00 4 mg via INTRAVENOUS
  Filled 2020-08-07: qty 2

## 2020-08-07 NOTE — ED Triage Notes (Signed)
Pt reports epigastric pain that has been going on for the past week. Pt denies n/v/d. Pt also reports suicidal thoughts "for a while now" with a plan to take all her medications.

## 2020-08-07 NOTE — Assessment & Plan Note (Signed)
Abdominal pain with perforated abdominal viscus peptic ulcer with perforation duodenal ulcer and complex cystic lesion in the head of the pancreas all of which may be contributing to decreased ability to empty the stomach with intractable nausea and vomiting  On top of this the patient's actively suicidal  I called the emergency room and they will see her as soon as her friend gets her over there the patient contracted for safety and would ride in the friend's car directly across the street to the emergency room

## 2020-08-07 NOTE — Progress Notes (Signed)
C/o mid abdominal pain w/ gas/vomiting x 2 days 164 79

## 2020-08-07 NOTE — Patient Instructions (Signed)
You will need to go back to the emergency room for further evaluation of your abdominal conditions  Also you will need mental health care because you are expressing a plan to end her life by taking an overdose of your medications  You have contracted for safety to go to the emergency room with your friend directly now to [facility]
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
## 2020-08-07 NOTE — ED Triage Notes (Signed)
Pt c/o N/V and headache that started yesterday.

## 2020-08-07 NOTE — BH Assessment (Addendum)
Assessment Note  Maria Murphy is an 64 y.o. female with self reported history of depression. States that she went to a doctors appointment today for stomach aches, vomiting, and pain issues. She feel that the doctors were able to resolve her medical issues. Therefore, made comments about feeling suicidal. She told her doctor today that she had a plan to take all her medications. Here sister drove her from the doctors office to [facility] for psychiatric evaluation. Patient continues to endorse suicidal ideations with a plan to overdose. She has access means (medications). She is not able to contract for safety. Patient further explains the stress of on-gong medical issues, pain, and feeling like a burden to others. States, "The doctors always tell me the problems but never a solution". She speaks of having arthritis and knee pain. She has tried to commit suicide in the past on two occasions. They were both suicide attempts by overdose. The triggers for those suicide attempts were "people". She did not elaborate any further. Patient was hospitalized for each suicide attempt at hospitals in [place] (prior to 2008) . Denies self mutilating behaviors. Depressive symptoms include:  Feeling angry/irritable, Feeling worthless/self pity, Loss of interest in usual pleasures, Fatigue, Isolating,Tearfulness, Insomnia, Despondent. Appetite is poor due to lack of appetite. She reports 30 pounds of weight loss in the past months. She does not sleep well and reports not more than 3 hrs of sleep per night. She does not have a family history of mental health illness. No history of trauma and/or abuse.   Clinician inquired about patient's ability to complete her ADL's.  Patient uses a walker. This week she had her toilet raised. She has plans this week to get a new shower so that she is able to get in/out of it more effectively. She independently completes her ADL's with minimum assistance. Her support system is her sister. States  that her sister helps her in the home with household duties (cleanging, cooking, etc.)  Patient denies HI. Denies history of aggressive and assaultive behaviors. Denies legal issues. Denies AVH's. She reports recent alcohol use x2 months. Patient drinks 2-3 glasses of wine daily. Her last drink was yesterday. Denies drug use. She has a psychiatrist, Maria Murphy. She is compliant with her psychiatric medications.   Patient is alert and oriented x4. Speech is normal. She is dressed in scrubs. Affect is flat. Mood is depressed and sad. Motor behavior is appropriate. Insight and judgement are poor. Impulse control is fair. She does not appear to be responding to any internal stimuli. No signs of delusional thoughts    Diagnosis: Major Depressive Disorder, Recurrent, Severe and Anxiety Disorder  Past Medical History:  Past Medical History:  Diagnosis Date  . Essential hypertension, benign 01/24/2015  . Foot ulcer ([place])   . Hypertension   . Memory loss   . Skin cancer   . Vision abnormalities     Past Surgical History:  Procedure Laterality Date  . CENTRAL LINE INSERTION Right 04/22/2020   Procedure: CENTRAL LINE INSERTION;  Surgeon: Virl Cagey, MD;  Location: [facility];  Service: General;  Laterality: Right;  . ESOPHAGOGASTRODUODENOSCOPY (EGD) WITH PROPOFOL N/A 07/10/2020   Procedure: ESOPHAGOGASTRODUODENOSCOPY (EGD) WITH PROPOFOL;  Surgeon: Rogene Houston, MD;  Location: [facility] SU[facility];  Service: Endoscopy;  Laterality: N/A;  . GASTRORRHAPHY  04/22/2020   Procedure: GASTRORRHAPHY;  Surgeon: Virl Cagey, MD;  Location: [facility];  Service: General;;  . IR CATHETER TUBE CHANGE  05/09/2020  . LAPAROTOMY  N/A 04/22/2020   Procedure: EXPLORATORY LAPAROTOMY;  Surgeon: Virl Cagey, MD;  Location: [facility];  Service: General;  Laterality: N/A;  . TONSILLECTOMY      Family History:  Family History  Problem Relation Age of Onset  . High Cholesterol Mother   . Hypertension Mother   .  Arthritis/Rheumatoid Mother   . Alcohol abuse Father   . Colon cancer Neg Hx   . Colon polyps Neg Hx     Social History:  reports that she has never smoked. She has never used smokeless tobacco. She reports that she does not drink alcohol and does not use drugs.  Additional Social History:  Alcohol / Drug Use Pain Medications: SEE MAR Prescriptions: SEE MAR Over the Counter: SEE MAR History of alcohol / drug use?: Yes Substance #1 Name of Substance 1: Alcohol 1 - Age of First Use: 64 yrs old 1 - Amount (size/oz): 2-3 glasses of wine 1 - Frequency: daily 1 - Duration: 2 months 1 - Last Use / Amount: 08/03/2020; 3-4 glasses of wine  CIWA: CIWA-Ar BP: (!) 168/82 Pulse Rate: 72 COWS:    Allergies:  Allergies  Allergen Reactions  . Wheat Extract Swelling    Home Medications: (Not in a hospital admission)   OB/GYN Status:  No LMP recorded. Patient is postmenopausal.  General Assessment Data Location of Assessment: [facility] ED TTS Assessment: In system Is this a Tele or Face-to-Face Assessment?: Tele Assessment Is this an Initial Assessment or a Re-assessment for this encounter?: Initial Assessment Patient Accompanied by:: Other (Sister brought patient to the ER from a doctors appointment. During the doctors appointment patient stated that she was suicidal with a plan. Patient told that doctor that she had a plan to overdose. She was then taken to the ER for a psych evaluation.) Language Other than English: Yes What is your preferred language: Spanish Living Arrangements:  (alone) What gender do you identify as?: Female Marital status: Widowed [name] name:  Maria Murphy) Pregnancy Status: No Living Arrangements: Alone Can pt return to current living arrangement?: Yes Admission Status: Voluntary Is patient capable of signing voluntary admission?: Yes Referral Source: Other Insurance type:  (Medicaid)     Crisis Care Plan Living Arrangements: Alone Legal Guardian:  (no  legal guardian) Name of Psychiatrist:  ("I was seeing Maria Murphy but I haven't seen him in 6 months") Name of Therapist:  (no therapist; sts that was set up for group therapy but didn't like group therapy .Marland Kitchen..sts she didn't like the set up)  Education Status Is patient currently in school?: No Is the patient employed, unemployed or receiving disability?:  ("I am not employed because I can't walk without a walker")  Risk to self with the past 6 months Suicidal Ideation: Yes-Currently Present Has patient been a risk to self within the past 6 months prior to admission? : Yes Suicidal Intent: Yes-Currently Present Has patient had any suicidal intent within the past 6 months prior to admission? : Yes Is patient at risk for suicide?: Yes Suicidal Plan?: Yes-Currently Present Has patient had any suicidal plan within the past 6 months prior to admission? : Yes Specify Current Suicidal Plan:  (overdose) Access to Means: Yes (prescription pills) Specify Access to Suicidal Means:  (overdose on pils) What has been your use of drugs/alcohol within the last 12 months?:  (Alcohol use recently) Previous Attempts/Gestures: Yes How many times?:  (1x 10 yrs ago-overdose) Triggers for Past Attempts: Other (Comment) ("problems and people") Intentional Self Injurious Behavior: None  Family Suicide History: Yes (mother-Schiozphrenia) Recent stressful life event(s): Other (Comment) ("I am tired of pain and if the doctors can't solve the issue it's not worth being around". Medicaid issues have been on-going for yrs...arthritis, "my knees are bone to bone", and the doctors gave me a cortisone injection that didn't work.) Persecutory voices/beliefs?: No Depression: Yes Depression Symptoms: Feeling angry/irritable,Feeling worthless/self pity,Loss of interest in usual pleasures,Fatigue,Isolating,Tearfulness,Insomnia,Despondent Substance abuse history and/or treatment for substance abuse?: No Suicide prevention  information given to non-admitted patients: Not applicable  Risk to Others within the past 6 months Homicidal Ideation: No Does patient have any lifetime risk of violence toward others beyond the six months prior to admission? : No Thoughts of Harm to Others: No Current Homicidal Intent: No Current Homicidal Plan: No Access to Homicidal Means: No Identified Victim:  (n/a) History of harm to others?: No Assessment of Violence: None Noted Violent Behavior Description:  (currently calm and cooperative) Does patient have access to weapons?: No Criminal Charges Pending?: No Does patient have a court date: No Is patient on probation?: No  Psychosis Hallucinations: Visual ("I sometimes see shadows and people but I know it's not real"-symptoms are daily) Delusions: None noted  Mental Status Report Appearance/Hygiene: In scrubs Eye Contact: Good Motor Activity: Freedom of movement Speech: Logical/coherent Level of Consciousness: Alert Mood: Depressed,Sad Affect: Sad,Depressed Anxiety Level: Severe Thought Processes: Relevant,Coherent Judgement: Impaired Orientation: Person,Place,Time,Situation Obsessive Compulsive Thoughts/Behaviors: None  Cognitive Functioning Concentration: Normal Memory: Recent Intact,Remote Intact Is patient IDD: No Insight: Poor Impulse Control: Fair Appetite: Poor ("I haven't eaten in 12 hrs...Marland KitchenMarland KitchenI am not hungry") Have you had any weight changes? : Loss Amount of the weight change? (lbs):  ("I loss 30 pounds in 1 month") Sleep: Decreased Total Hours of Sleep:  (3-4 hrs per night) Vegetative Symptoms: None  ADLScreening [facility] Assessment Services) Patient's cognitive ability adequate to safely complete daily activities?: Yes Patient able to express need for assistance with ADLs?: Yes Independently performs ADLs?: Yes (appropriate for developmental age)  Prior Inpatient Therapy Prior Inpatient Therapy: Yes Prior Therapy Dates:  (3 prior  hospitalizatoins "they were all before 2009") Prior Therapy Facilty/Provider(s):  ([facility] and  "a facility in [place]",) Reason for Treatment:  (suicidal)  Prior Outpatient Therapy Prior Outpatient Therapy: Yes Prior Therapy Dates:  (last visit was 6 months ago) Prior Therapy Facilty/Provider(s):  ("I was seeing Maria Murphy but I haven't seen him in 6 months") Reason for Treatment:  (depression) Does patient have an ACCT team?: No Does patient have Intensive In-House Services?  : No Does patient have [facility] services? : No Does patient have P4[facility] services?: No  ADL Screening (condition at time of admission) Patient's cognitive ability adequate to safely complete daily activities?: Yes Is the patient deaf or have difficulty hearing?: No Does the patient have difficulty seeing, even when wearing glasses/contacts?: No Does the patient have difficulty concentrating, remembering, or making decisions?: No Patient able to express need for assistance with ADLs?: Yes Does the patient have difficulty dressing or bathing?: No Independently performs ADLs?: Yes (appropriate for developmental age) Does the patient have difficulty walking or climbing stairs?: Yes Weakness of Legs: Right Weakness of Arms/Hands: None  Home Assistive Devices/Equipment Home Assistive Devices/Equipment: Gilford Rile (specify type) (Patient states that she uses a walker. She recently had her toilet changed to a higher toilet. This week her toilet will be changed to a walk in shower.)    Abuse/Neglect Assessment (Assessment to be complete while patient is alone) Physical Abuse: Denies Verbal  Abuse: Denies Sexual Abuse: Denies Exploitation of patient/patient's resources: Denies Self-Neglect: Denies     Regulatory affairs officer (For Healthcare) Does Patient Have a Medical Advance Directive?: No Would patient like information on creating a medical advance directive?: No - Patient declined          Disposition: Per  Lindon Romp, NP, patient meets criteria for [facility] placement. Disposition Social Worker to seek appropriate [facility] placement.   Disposition Initial Assessment Completed for this Encounter: Yes  On Site Evaluation by:   Reviewed with Physician:    Waldon Merl 08/07/2020 9:30 PM
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
## 2020-08-07 NOTE — Assessment & Plan Note (Signed)
Actively suicidal  Referral to the emergency room was accomplished

## 2020-08-07 NOTE — Progress Notes (Signed)
Subjective:    Patient ID: Maria Murphy, female    DOB: Jul 11, 1956, 64 y.o.   MRN: [id number]  64 y.o.F here on referral from Dr Wynetta Emery who saw this patient as new pt /post hosp TCM visit 07/23/20: This patient has a very complicated history she was hospitalized in August to end of September for perforated gastric ulcer and is status post exploratory laparotomy with Phillip Heal patch by Dr. Arnoldo Morale of general surgery.  Patient then developed pelvic and liver abscesses requiring IR drainage.  Cultures there grew Candida and Enterococcus.  She received antibiotics.  The patient was to be discharged only to develop submassive pulmonary emboli.  The patient was given intravenous heparin echo showed preserved LV function but RV strain.  There were elevated pressures.  Patient was sent home on Xarelto when she declined to SNF.  Patient then readmitted November 12-19 of that month with abdominal pain nausea and vomiting.  Endoscopy showed deep bulbar ulcer without stigmata of bleeding.  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  Surgery and gastroenterology saw the patient.  Blood counts remained stable she was then discharged.  Patient then went to the emergency room November 27 with upper abdominal pain and cramping and persistent nausea and vomiting stool culture positive for E. coli which was pathogenic.  Patient given IV fluids and sent home.  Patient then returned just today to the emergency room between 1 AM and 5:30 AM today with similar complaints as of the November 27 visit she was again given IV fluids and then discharged home.  The patient arrives in the office very distraught and upset she is throwing up in the trash can here in the office.  She actually now has formulation of plan to kill herself by overdosing on her own medication she even researched online what medicines to take and what combination to end her life the fastest.     The patient has continued  lisinopril HCTZ for hypertension, she is on Celexa for antidepressant but not working well with this.  She does take Aricept for memory loss.   Past Medical History:  Diagnosis Date  . Essential hypertension, benign 01/24/2015  . Foot ulcer ([place])   . Hypertension   . Memory loss   . Skin cancer   . Vision abnormalities      Family History  Problem Relation Age of Onset  . High Cholesterol Mother   . Hypertension Mother   . Arthritis/Rheumatoid Mother   . Alcohol abuse Father   . Colon cancer Neg Hx   . Colon polyps Neg Hx      Social History   Socioeconomic History  . Marital status: Widowed    Spouse name: Not on file  . Number of children: 2  . Years of education: Not on file  . Highest education level: Not on file  Occupational History  . Not on file  Tobacco Use  . Smoking status: Never Smoker  . Smokeless tobacco: Never Used  Vaping Use  . Vaping Use: Never used  Substance and Sexual Activity  . Alcohol use: No    Alcohol/week: 1.0 standard drink    Types: 1 Glasses of wine per week    Comment: drank alcohol for past 2 weeks, one glass of wine. Prior to this would drink glass of alcohol on weekends with dinner.   . Drug use: No  . Sexual activity: Not Currently    Birth control/protection: Post-menopausal  Other Topics Concern  .  Not on file  Social History Narrative  . Not on file   Social Determinants of Health   Financial Resource Strain: Not on file  Food Insecurity: Not on file  Transportation Needs: Not on file  Physical Activity: Not on file  Stress: Not on file  Social Connections: Not on file  Intimate Partner Violence: Not on file     Allergies  Allergen Reactions  . Wheat Extract Swelling     Outpatient Medications Prior to Visit  Medication Sig Dispense Refill  . acetaminophen (TYLENOL) 500 MG tablet Take 1 tablet (500 mg total) by mouth every 6 (six) hours as needed. 30 tablet 0  . ciprofloxacin (CIPRO) 500 MG tablet Take 1 tablet  (500 mg total) by mouth 2 (two) times daily. 6 tablet 0  . citalopram (CELEXA) 20 MG tablet Take 1 tablet (20 mg total) by mouth daily. 30 tablet 0  . diclofenac Sodium (VOLTAREN) 1 % GEL Apply 2 g topically 4 (four) times daily. 50 g 1  . dicyclomine (BENTYL) 10 MG capsule Take 1 capsule (10 mg total) by mouth 2 (two) times daily before a meal. 60 capsule 0  . donepezil (ARICEPT) 10 MG tablet Take 5-10 mg by mouth in the morning and at bedtime.     . gabapentin (NEURONTIN) 300 MG capsule Take 300 mg by mouth 2 (two) times daily.     . Ginger, Zingiber officinalis, (GINGER PO) Take 1 tablet by mouth daily.     Marland Kitchen lisinopril-hydrochlorothiazide (ZESTORETIC) 10-12.5 MG tablet Take 1 tablet by mouth daily. 90 tablet 1  . ondansetron (ZOFRAN ODT) 8 MG disintegrating tablet Take 1 tablet (8 mg total) by mouth every 8 (eight) hours as needed for nausea or vomiting. 20 tablet 0  . pantoprazole (PROTONIX) 40 MG tablet Take 1 tablet (40 mg total) by mouth 2 (two) times daily. 60 tablet 0  . rivaroxaban (XARELTO) 20 MG TABS tablet Take 1 tablet (20 mg total) by mouth daily with supper. To start after initial loading dose pack. 30 tablet 0  . sucralfate (CARAFATE) 1 GM/10ML suspension Take 10 mLs (1 g total) by mouth 4 (four) times daily -  with meals and at bedtime. 420 mL 0  . traZODone (DESYREL) 100 MG tablet Take 200 mg by mouth at bedtime.     Marland Kitchen RIVAROXABAN (XARELTO) VTE STARTER PACK (15 & 20 MG) Follow package directions: Take one 15mg  tablet by mouth twice a day. On day 22, switch to one 20mg  tablet once a day. Take with food. (Patient not taking: Reported on 08/07/2020) 51 each 0   No facility-administered medications prior to visit.      Review of Systems  Constitutional: Positive for activity change, appetite change, fatigue and unexpected weight change.  HENT: Negative.   Respiratory: Negative.   Gastrointestinal: Positive for abdominal distention, abdominal pain, nausea and vomiting.   Genitourinary: Negative.   Musculoskeletal:       Knee pain  Psychiatric/Behavioral: Positive for behavioral problems, decreased concentration, dysphoric mood, self-injury, sleep disturbance and suicidal ideas. The patient is nervous/anxious.        Objective:   Physical Exam  Vitals:   08/07/20 1048  BP: (!) 164/79  Pulse: 65  Resp: 20  Temp: 98.1 F (36.7 C)  TempSrc: Oral  SpO2: 98%  Weight: 192 lb 12.8 oz (87.5 kg)  Height: 5\' 4"  (1.626 m)    Gen: Patient depressed and withdrawn and had vomited in the trash can in the exam room  ENT: No lesions,  mouth clear,  oropharynx clear, no postnasal drip  Neck: No JVD, no TMG, no carotid bruits  Lungs: No use of accessory muscles, no dullness to percussion, clear without rales or rhonchi  Cardiovascular: RRR, heart sounds normal, no murmur or gallops, no peripheral edema  Abdomen: Bowel sounds hypoactive abdomen not distended tender in right upper quadrant is noted  Musculoskeletal: No deformities, no cyanosis or clubbing  Neuro: alert, non focal  Skin: Warm, no lesions or rashes  CT Abdomen Pelvis W Contrast  Result Date: 08/07/2020 CLINICAL DATA:  Abdominal distension EXAM: CT ABDOMEN AND PELVIS WITH CONTRAST TECHNIQUE: Multidetector CT imaging of the abdomen and pelvis was performed using the standard protocol following bolus administration of intravenous contrast. CONTRAST:  113mL OMNIPAQUE IOHEXOL 300 MG/ML  SOLN COMPARISON:  None. FINDINGS: Lower chest: The visualized lung bases are clear bilaterally. The visualized heart and pericardium are unremarkable. Small hiatal hernia. Circumferential thickening of the distal esophagus is seen, nonspecific, but possibly reflecting changes of esophagitis, as can be seen with reflux esophagitis. Hepatobiliary: Cholelithiasis again noted without pericholecystic inflammatory change identified. Liver unremarkable save for a stable tiny cyst within the right hepatic dome. No intra or  extrahepatic biliary ductal dilation. Pancreas: Partially calcified, multilobulated cystic lesion within the head of the pancreas appears unchanged, measuring 3.5 x 4.0 cm at axial image # 34. This was better assessed on MRI examination of 04/22/2020 and differential considerations include a a mucinous cystic neoplasm or less likely, the sequela of prior pancreatitis. The pancreas is otherwise unremarkable. Spleen: Unremarkable Adrenals/Urinary Tract: The adrenal glands are unremarkable. The kidneys are normal in size and position. Multiple simple and parapelvic cysts are seen bilaterally. No intrarenal or ureteral calculi are identified. There is no hydronephrosis. A complex exophytic partially calcified lesion is seen arising laterally from the upper pole the right kidney, better characterized on prior MRI examination of 04/22/2020, characterized as a complex but benign cyst. The bladder is unremarkable. Stomach/Bowel: There is circumferential mural thickening of the a duodenal bulb which demonstrates a somewhat cloverleaf appearance, likely result of prior ulceration and surgical repair. There is, however, mucosal enhancement and mural ulceration, best seen on coronal image # 28 and sagittal image # 47 which does not appear to be transmural at this time. There is no free intraperitoneal gas or fluid identified. Severe sigmoid diverticulosis. The small and large bowel are otherwise unremarkable. No evidence of obstruction or focal inflammation in this region. Vascular/Lymphatic: Mild iliac atherosclerotic calcification. The abdominal vasculature is otherwise unremarkable. No pathologic adenopathy within the abdomen and pelvis. Reproductive: Multiple enhancing intramural and exophytic masses are seen within the uterus in keeping with multiple uterine fibroids. No adnexal masses. Other: Rectum unremarkable.  No abdominal wall hernia. Musculoskeletal: Degenerative changes are seen within the lumbar spine. No acute  bone abnormality. IMPRESSION: No radiographic explanation for the patient's reported abdominal distension. Circumferential mural thickening involving the duodenal bulb, with intramural ulceration similar to that noted on prior examination. No evidence of a transmural ulceration and resultant visceral perforation. Cholelithiasis without CT evidence of acute cholecystitis. 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 This was better characterized on MRI examination of 04/22/2020. Stable complex but benign exophytic lesion involving the upper pole the right kidney, again better characterized on prior MRI examination of 04/22/2020. Aortic Atherosclerosis (ICD10-I70.0). Electronically Signed   By: Fidela Salisbury MD   On: 08/07/2020 03:28  Assessment & Plan:  I personally reviewed all images and lab data in the [facility] system as well as any outside material available during this office visit and agree with the  radiology impressions.   Acute saddle pulmonary embolism without acute cor pulmonale (HCC) Unclear if the patient is able to absorb the Xarelto she has been given this will need to be reassessed  Abdominal pain Abdominal pain with perforated abdominal viscus peptic ulcer with perforation duodenal ulcer and complex cystic lesion in the head of the pancreas all of which may be contributing to decreased ability to empty the stomach with intractable nausea and vomiting  On top of this the patient's actively suicidal  I called the emergency room and they will see her as soon as her friend gets her over there the patient contracted for safety and would ride in the friend's car directly across the street to the emergency room  Depression Severe depression there is a history of this but now quite exaggerated by her loss of health and now actively suicidal  Will need mental health consultation  Suicidal  ideations Actively suicidal  Referral to the emergency room was accomplished   Arlette was seen today for pulmonary embolism follow-up .  Diagnoses and all orders for this visit:  Acute saddle pulmonary embolism without acute cor pulmonale (HCC)  Pain of upper abdomen  Severe episode of recurrent major depressive disorder, without psychotic features ([place])  Suicidal ideations

## 2020-08-07 NOTE — ED Notes (Signed)
Patient transported to CT 

## 2020-08-07 NOTE — Discharge Instructions (Addendum)
Continue your Protonix twice a day.  Use the Zofran dissolvable pills under your tongue every 8 hours as needed for nausea or vomiting.  Return to the emergency department if you have uncontrolled vomiting or your abdominal cramping gets worse.  Keep your appointment with Dr. Laural Golden in January.

## 2020-08-07 NOTE — Assessment & Plan Note (Signed)
Unclear if the patient is able to absorb the Xarelto she has been given this will need to be reassessed

## 2020-08-07 NOTE — ED Notes (Signed)
Pt on phone with sister at this time

## 2020-08-07 NOTE — BH Assessment (Signed)
Assessment Note  Maria Murphy is an 64 y.o. female.   Diagnosis:     Past Medical History:  Past Medical History:  Diagnosis Date  . Essential hypertension, benign 01/24/2015  . Foot ulcer ([place])   . Hypertension   . Memory loss   . Skin cancer   . Vision abnormalities     Past Surgical History:  Procedure Laterality Date  . CENTRAL LINE INSERTION Right 04/22/2020   Procedure: CENTRAL LINE INSERTION;  Surgeon: Virl Cagey, MD;  Location: [facility] ORS;  Service: General;  Laterality: Right;  . ESOPHAGOGASTRODUODENOSCOPY (EGD) WITH PROPOFOL N/A 07/10/2020   Procedure: ESOPHAGOGASTRODUODENOSCOPY (EGD) WITH PROPOFOL;  Surgeon: Rogene Houston, MD;  Location: [facility] ENDO SUITE;  Service: Endoscopy;  Laterality: N/A;  . GASTRORRHAPHY  04/22/2020   Procedure: GASTRORRHAPHY;  Surgeon: Virl Cagey, MD;  Location: [facility] ORS;  Service: General;;  . IR CATHETER TUBE CHANGE  05/09/2020  . LAPAROTOMY N/A 04/22/2020   Procedure: EXPLORATORY LAPAROTOMY;  Surgeon: Virl Cagey, MD;  Location: [facility] ORS;  Service: General;  Laterality: N/A;  . TONSILLECTOMY      Family History:  Family History  Problem Relation Age of Onset  . High Cholesterol Mother   . Hypertension Mother   . Arthritis/Rheumatoid Mother   . Alcohol abuse Father   . Colon cancer Neg Hx   . Colon polyps Neg Hx     Social History:  reports that she has never smoked. She has never used smokeless tobacco. She reports that she does not drink alcohol and does not use drugs.  Additional Social History:  Alcohol / Drug Use Pain Medications: SEE MAR Prescriptions: SEE MAR Over the Counter: SEE MAR History of alcohol / drug use?: Yes Substance #1 Name of Substance 1: Alcohol 1 - Age of First Use: 64 yrs old 1 - Amount (size/oz): 2-3 glasses of wine 1 - Frequency: daily 1 - Duration: 2 months 1 - Last Use / Amount: 08/03/2020; 3-4 glasses of wine  CIWA: CIWA-Ar BP: (!) 168/82 Pulse Rate: 72 COWS:    Allergies:   Allergies  Allergen Reactions  . Wheat Extract Swelling    Home Medications: (Not in a hospital admission)   OB/GYN Status:  No LMP recorded. Patient is postmenopausal.  General Assessment Data Location of Assessment: [facility] ED TTS Assessment: In system Is this a Tele or Face-to-Face Assessment?: Tele Assessment Is this an Initial Assessment or a Re-assessment for this encounter?: Initial Assessment Patient Accompanied by:: Other (Sister brought patient to the ER from a doctors appointment. During the doctors appointment patient stated that she was suicidal with a plan. Patient told that doctor that she had a plan to overdose. She was then taken to the ER for a psych evaluation.) Language Other than English: Yes What is your preferred language: Spanish Living Arrangements:  (alone) What gender do you identify as?: Female Marital status: Widowed Maria Murphy name:  [name]) Pregnancy Status: No Living Arrangements: Alone Can pt return to current living arrangement?: Yes Admission Status: Voluntary Is patient capable of signing voluntary admission?: Yes Referral Source: Other Insurance type:  (Medicaid)     Crisis Care Plan Living Arrangements: Alone Legal Guardian:  (no legal guardian) Name of Psychiatrist:  ("I was seeing Dr. Mirna Mires but I haven't seen him in 6 months") Name of Therapist:  (no therapist; sts that was set up for group therapy but didn't like group therapy .Marland Kitchen..sts she didn't like the set up)  Education Status Is patient currently in  school?: No Is the patient employed, unemployed or receiving disability?:  ("I am not employed because I can't walk without a walker")  Risk to self with the past 6 months Suicidal Ideation: Yes-Currently Present Has patient been a risk to self within the past 6 months prior to admission? : Yes Suicidal Intent: Yes-Currently Present Has patient had any suicidal intent within the past 6 months prior to admission? : Yes Is patient at risk  for suicide?: Yes Suicidal Plan?: Yes-Currently Present Has patient had any suicidal plan within the past 6 months prior to admission? : Yes Specify Current Suicidal Plan:  (overdose) Access to Means: Yes (prescription pills) Specify Access to Suicidal Means:  (overdose on pils) What has been your use of drugs/alcohol within the last 12 months?:  (Alcohol use recently) Previous Attempts/Gestures: Yes How many times?:  (1x 10 yrs ago-overdose) Triggers for Past Attempts: Other (Comment) ("problems and people") Intentional Self Injurious Behavior: None Family Suicide History: Yes (mother-Schiozphrenia) Recent stressful life event(s): Other (Comment) ("I am tired of pain and if the doctors can't solve the issue it's not worth being around". Medicaid issues have been on-going for yrs...arthritis, "my knees are bone to bone", and the doctors gave me a cortisone injection that didn't work.) Persecutory voices/beliefs?: No Depression: Yes Depression Symptoms: Feeling angry/irritable,Feeling worthless/self pity,Loss of interest in usual pleasures,Fatigue,Isolating,Tearfulness,Insomnia,Despondent Substance abuse history and/or treatment for substance abuse?: No Suicide prevention information given to non-admitted patients: Not applicable  Risk to Others within the past 6 months Homicidal Ideation: No Does patient have any lifetime risk of violence toward others beyond the six months prior to admission? : No Thoughts of Harm to Others: No Current Homicidal Intent: No Current Homicidal Plan: No Access to Homicidal Means: No Identified Victim:  (n/a) History of harm to others?: No Assessment of Violence: None Noted Violent Behavior Description:  (currently calm and cooperative) Does patient have access to weapons?: No Criminal Charges Pending?: No Does patient have a court date: No Is patient on probation?: No  Psychosis Hallucinations: Visual ("I sometimes see shadows and people but I know  it's not real"-symptoms are daily) Delusions: None noted  Mental Status Report Appearance/Hygiene: In scrubs Eye Contact: Good Motor Activity: Freedom of movement Speech: Logical/coherent Level of Consciousness: Alert Mood: Depressed,Sad Affect: Sad,Depressed Anxiety Level: Severe Thought Processes: Relevant,Coherent Judgement: Impaired Orientation: Person,Place,Time,Situation Obsessive Compulsive Thoughts/Behaviors: None  Cognitive Functioning Concentration: Normal Memory: Recent Intact,Remote Intact Is patient IDD: No Insight: Poor Impulse Control: Fair Appetite: Poor ("I haven't eaten in 12 hrs...Marland KitchenMarland KitchenI am not hungry") Have you had any weight changes? : Loss Amount of the weight change? (lbs):  ("I loss 30 pounds in 1 month") Sleep: Decreased Total Hours of Sleep:  (3-4 hrs per night) Vegetative Symptoms: None  ADLScreening [facility] Assessment Services) Patient's cognitive ability adequate to safely complete daily activities?: Yes Patient able to express need for assistance with ADLs?: Yes Independently performs ADLs?: Yes (appropriate for developmental age)  Prior Inpatient Therapy Prior Inpatient Therapy: Yes Prior Therapy Dates:  (3 prior hospitalizatoins "they were all before 2009") Prior Therapy Facilty/Provider(s):  ([facility] and  "a facility in [place]",) Reason for Treatment:  (suicidal)  Prior Outpatient Therapy Prior Outpatient Therapy: Yes Prior Therapy Dates:  (last visit was 6 months ago) Prior Therapy Facilty/Provider(s):  ("I was seeing Dr. Mirna Mires but I haven't seen him in 6 months") Reason for Treatment:  (depression) Does patient have an ACCT team?: No Does patient have Intensive In-House Services?  : No Does patient  have [facility] services? : No Does patient have P4CC services?: No  ADL Screening (condition at time of admission) Patient's cognitive ability adequate to safely complete daily activities?: Yes Is the patient deaf or have difficulty  hearing?: No Does the patient have difficulty seeing, even when wearing glasses/contacts?: No Does the patient have difficulty concentrating, remembering, or making decisions?: No Patient able to express need for assistance with ADLs?: Yes Does the patient have difficulty dressing or bathing?: No Independently performs ADLs?: Yes (appropriate for developmental age) Does the patient have difficulty walking or climbing stairs?: Yes Weakness of Legs: Right Weakness of Arms/Hands: None  Home Assistive Devices/Equipment Home Assistive Devices/Equipment: Gilford Rile (specify type) (Patient states that she uses a walker. She recently had her toilet changed to a higher toilet. This week her toilet will be changed to a walk in shower.)    Abuse/Neglect Assessment (Assessment to be complete while patient is alone) Physical Abuse: Denies Verbal Abuse: Denies Sexual Abuse: Denies Exploitation of patient/patient's resources: Denies Self-Neglect: Denies     Regulatory affairs officer (For Healthcare) Does Patient Have a Medical Advance Directive?: No Would patient like information on creating a medical advance directive?: No - Patient declined          Disposition:  Disposition Initial Assessment Completed for this Encounter: Yes  On Site Evaluation by:   Reviewed with Physician:    Waldon Merl 08/07/2020 9:26 PM

## 2020-08-07 NOTE — Assessment & Plan Note (Signed)
Severe depression there is a history of this but now quite exaggerated by her loss of health and now actively suicidal  Will need mental health consultation

## 2020-08-08 ENCOUNTER — Telehealth: Payer: Self-pay | Admitting: Internal Medicine

## 2020-08-08 ENCOUNTER — Ambulatory Visit: Payer: Medicaid Other | Admitting: Critical Care Medicine

## 2020-08-08 NOTE — ED Notes (Signed)
Report called and given to [place]. Safe transport was also called. Following RN made aware.

## 2020-08-08 NOTE — ED Notes (Signed)
Pt belongings given to [place] with [facility]. Shelba Flake, RN

## 2020-08-08 NOTE — ED Notes (Signed)
Lunch Tray Ordered @ 1032. 

## 2020-08-08 NOTE — Telephone Encounter (Signed)
Copied from [place] [id number]. Topic: General - Inquiry >> Aug 08, 2020 12:27 PM Greggory Keen D wrote: Reason for CRM: Pt's sister [name] is calling to find the status of her sister  She stated that Dr. Joya Gaskins sent her to the hospital yesterday ans now her sister can not take calls.  CB#  [phone]   Did not see a DPR on file for patient. Please advise/follow up with sister if appropriate.

## 2020-08-08 NOTE — Progress Notes (Signed)
Pt accepted to [facility].    Dr. Jonelle Sports is the accepting/attending provider.    Call report to [phone]  [facility] ED notified.     Pt is voluntary and will be transported [facility]  Pt may arrive to [facility] as soon as transportation is arranged.    Audree Camel, MSW, LCSW, [place] Clinical Social Worker II Disposition CSW [phone]

## 2020-08-08 NOTE — Telephone Encounter (Signed)
Unfortunately if her sister is not on the DPR we are unable to give her any information

## 2020-08-08 NOTE — Progress Notes (Signed)
Pt meets inpatient criteria per Lindon Romp, NP. Referral information has been sent to the following geriatric psychiatric hospitals for review:  [facility]  [facility]  [facility]     Disposition will continue to follow for inpatient psychiatric needs.    Audree Camel, MSW, LCSW, [place] Clinical Social Worker II Disposition CSW [phone]

## 2020-08-08 NOTE — ED Provider Notes (Signed)
3:48 PM Patient excepted in transfer to [facility].  Dr. Jonelle Sports accepts.  Patient is in no distress, is aware of transfer.   Carmin Muskrat, MD 08/08/20 [phone]

## 2020-08-08 NOTE — ED Notes (Signed)
Breakfast ordered 

## 2020-08-14 NOTE — Telephone Encounter (Signed)
Call placed to patient # [phone] to further discuss PCS needs.  Voicemail full, unable to leave a message.

## 2020-08-15 NOTE — Telephone Encounter (Signed)
Attempted to contact the patient again # [phone] and the message states that the voicemail is not set up.

## 2020-08-29 ENCOUNTER — Telehealth: Payer: Self-pay

## 2020-08-29 NOTE — Telephone Encounter (Signed)
Another attempt made to contact patient - Call placed to # [phone] to further discuss PCS needs.  Voicemail full, unable to leave a message.  Letter sent to patient requesting she call this CM to discuss the [facility] request

## 2020-08-29 NOTE — Telephone Encounter (Signed)
Opened in error

## 2020-09-05 ENCOUNTER — Encounter: Payer: Self-pay | Admitting: *Deleted

## 2020-09-05 ENCOUNTER — Other Ambulatory Visit: Payer: Self-pay

## 2020-09-05 ENCOUNTER — Telehealth: Payer: Self-pay

## 2020-09-05 ENCOUNTER — Encounter: Payer: Self-pay | Admitting: Nurse Practitioner

## 2020-09-05 ENCOUNTER — Ambulatory Visit: Payer: Medicaid Other | Admitting: Nurse Practitioner

## 2020-09-05 VITALS — BP 137/82 | HR 73 | Temp 96.6°F | Ht 64.0 in | Wt 194.0 lb

## 2020-09-05 DIAGNOSIS — K269 Duodenal ulcer, unspecified as acute or chronic, without hemorrhage or perforation: Secondary | ICD-10-CM | POA: Diagnosis not present

## 2020-09-05 DIAGNOSIS — R1010 Upper abdominal pain, unspecified: Secondary | ICD-10-CM

## 2020-09-05 DIAGNOSIS — I2692 Saddle embolus of pulmonary artery without acute cor pulmonale: Secondary | ICD-10-CM | POA: Diagnosis not present

## 2020-09-05 DIAGNOSIS — K275 Chronic or unspecified peptic ulcer, site unspecified, with perforation: Secondary | ICD-10-CM

## 2020-09-05 DIAGNOSIS — K8020 Calculus of gallbladder without cholecystitis without obstruction: Secondary | ICD-10-CM

## 2020-09-05 NOTE — Progress Notes (Signed)
Opened in error

## 2020-09-05 NOTE — Patient Instructions (Signed)
Your health issues we discussed today were:   Abdominal pain and nausea: 1. As we discussed, we will schedule a gallbladder test to see if your gallstones are causing your symptoms 2. Further recommendations will follow 3. As we discussed, we may refer you to surgery discussed possible gallbladder removal 4. Let us know if you have any worsening or severe symptoms  History of stomach and small intestine ulcers: 1. We will repeat the upper endoscopy tentatively within the next 1 to 2 months 2. We will need to get the okay from Dr. Joya Gaskins to hold your Xarelto for 48 hours prior 3. Depending on if we can hold your Xarelto, we may need to alter the scheduling 4. Stop taking all NSAIDs (ibuprofen, Motrin, Advil, Aleve, naproxen, Naprosyn, Goody powders, BC powders, anything with "NSAID" on the bottle) 5. As we discussed you can use Tylenol over-the-counter for pain.  If this is not effective for you discussed with your primary care about other prescription pain management options 6. Called to be having worsening or severe symptoms  Diarrhea: 1. Am glad your symptoms are getting better excavation point 2. If you need medications to help with this, let us know 3. If your diarrhea gets worse or he began to feel sick from it, let us now 4. We will follow-up on your diarrhea at your follow-up visit 5. It should be noted that gallbladder issues can sometimes cause some amount of diarrhea  Overall I recommend:  1. Continue other current medications 2. Return for follow-up in 2 months 3. Because for any questions or concerns   ---------------------------------------------------------------  I am glad you have gotten your COVID-19 vaccination!  Even though you are fully vaccinated you should continue to follow CDC and state/local guidelines.  ---------------------------------------------------------------   At [facility] we value your feedback. You may receive a survey about  your visit today. Please share your experience as we strive to create trusting relationships with our patients to provide genuine, compassionate, quality care.  We appreciate your understanding and patience as we review any laboratory studies, imaging, and other diagnostic tests that are ordered as we care for you. Our office policy is 5 business days for review of these results, and any emergent or urgent results are addressed in a timely manner for your best interest. If you do not hear from our office in 1 week, please contact us.   We also encourage the use of MyChart, which contains your medical information for your review as well. If you are not enrolled in this feature, an access code is on this after visit summary for your convenience. Thank you for allowing [place] to be involved in your care.  It was great to see you today!  I hope you have a great winter, stay safe and warm this weekend!!

## 2020-09-05 NOTE — Telephone Encounter (Signed)
Pt. Has been cleared to proceed with procedure

## 2020-09-05 NOTE — Telephone Encounter (Signed)
Dear Dr. Asencion Noble,  We see a mutual patient Maria Murphy (DOB 07/20/1956). This patient is needing Pulmonary clearance for a procedure (EGD with RMR or Prop/MAC ASA lll.) will it be ok for the patient to hold her Xarelto 48 hours prior to the procedure.     Thank you, Floria Raveling, CMA

## 2020-09-05 NOTE — Progress Notes (Signed)
Referring Provider: Elsie Stain, MD Primary Care Physician:  Ladell Pier, MD Primary GI:  Dr. Gala Romney  Chief Complaint  Patient presents with  . Diarrhea    daily  . Abdominal Pain    After eating, right upper. When stressed mid upper abd    HPI:   [name] is a 65 y.o. female who presents for hospital follow-up. Patient was admitted to hospital 07/05/2020 through 07/12/2020. The patient had a recent hospitalization with a complicated course involving perforated gastric ulcer treated with operative management and subsequent development of abdominal abscess that required drain placement. 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 Symptoms are felt related to gastritis versus residual peptic ulcer disease.  She underwent EGD 07/10/2020 with a deep bulbar ulcer noted without stigmata of bleeding for which she was treated with IV PPI twice daily for 48 hours. H. pylori biopsies were negative. No further bleeding noted and hemoglobin levels remained stable. She was transitioned to oral PPI twice daily and recommended strict NSAID avoidance. Recommended outpatient GI follow-up. CBC on day of discharge 10.1.  Most recent CBC on 08/07/2020 was 10.9.  Of note she was in the emergency department 08/07/2020 for epigastric pain and notes that she was diagnosed with an ulcer and has been taking her PPI as recommended. At that time she noted that she was suicidal and thinking about taking her medications because of chronic medical conditions and family does not support her. Right upper quadrant ultrasound completed 08/07/2020 found multiple gallstones with no signs of acute cholecystitis. Her GI work-up found symptoms adequately  evaluated. She was subsequently seen by psychiatry who recommended inpatient placement and she was transported/transferred to [facility].  Today she states she is doing okay overall. Denies hematochezia or melena. She is taking Protonix twice daily. She takes Naproxen twice a day. Denies ASA powders. She was unaware of NSAIDs and ulcer risk. Discussed using Tylenol instead. She is having RUQ pain post-prandially which has been going on for a while. She bakes everything and avoid greasy/oily foods. She has associated nausea with this. No vomiting. Laying down helps it eventually pass with time. She has been having 3-4 loose stools a day, all of her stools are loose. They're not watery. This has been onging for about a year. They used to be watery so "loose stools aren't that bad." She doesn't take anything for it. She thinks it may be from one of her medications. Denies fever, chills, unintentional weight loss. Denies URI or flu-like symptoms. The patient has received COVID-19 vaccination(s). They state they need to get a booster.. Denies loss of sense of taste or smell. Denies chest pain, dyspnea, dizziness, lightheadedness, syncope, near syncope. Denies any other upper or lower GI symptoms.  Overall she states her diarrhea is better, no urgency, is tolerable to her.  Past Medical History:  Diagnosis Date  . Essential hypertension, benign 01/24/2015  . Foot ulcer ([place])   . Hypertension   . Memory loss   . Skin cancer   . Vision abnormalities     Past Surgical History:  Procedure Laterality Date  . CENTRAL LINE INSERTION Right 04/22/2020   Procedure: CENTRAL LINE INSERTION;  Surgeon: Virl Cagey, MD;  Location: [facility] ORS;  Service: General;  Laterality: Right;  .  ESOPHAGOGASTRODUODENOSCOPY (EGD) WITH PROPOFOL N/A 07/10/2020   Procedure: ESOPHAGOGASTRODUODENOSCOPY (EGD) WITH PROPOFOL;  Surgeon: Rogene Houston, MD;  Location: [facility] ENDO SUITE;  Service: Endoscopy;  Laterality: N/A;  .  GASTRORRHAPHY  04/22/2020   Procedure: GASTRORRHAPHY;  Surgeon: Virl Cagey, MD;  Location: [facility] ORS;  Service: General;;  . IR CATHETER TUBE CHANGE  05/09/2020  . LAPAROTOMY N/A 04/22/2020   Procedure: EXPLORATORY LAPAROTOMY;  Surgeon: Virl Cagey, MD;  Location: [facility] ORS;  Service: General;  Laterality: N/A;  . TONSILLECTOMY      Current Outpatient Medications  Medication Sig Dispense Refill  . acetaminophen (TYLENOL) 500 MG tablet Take 1 tablet (500 mg total) by mouth every 6 (six) hours as needed. 30 tablet 0  . citalopram (CELEXA) 20 MG tablet Take 1 tablet (20 mg total) by mouth daily. 30 tablet 0  . diclofenac Sodium (VOLTAREN) 1 % GEL Apply 2 g topically 4 (four) times daily. 50 g 1  . dicyclomine (BENTYL) 10 MG capsule Take 1 capsule (10 mg total) by mouth 2 (two) times daily before a meal. 60 capsule 0  . donepezil (ARICEPT) 10 MG tablet Take 5-10 mg by mouth in the morning and at bedtime.     . gabapentin (NEURONTIN) 300 MG capsule Take 300 mg by mouth 2 (two) times daily.     . Ginger, Zingiber officinalis, (GINGER PO) Take 1 tablet by mouth daily.     Marland Kitchen lisinopril-hydrochlorothiazide (ZESTORETIC) 10-12.5 MG tablet Take 1 tablet by mouth daily. 90 tablet 1  . ondansetron (ZOFRAN ODT) 8 MG disintegrating tablet Take 1 tablet (8 mg total) by mouth every 8 (eight) hours as needed for nausea or vomiting. 20 tablet 0  . pantoprazole (PROTONIX) 40 MG tablet Take 1 tablet (40 mg total) by mouth 2 (two) times daily. 60 tablet 0  . rivaroxaban (XARELTO) 20 MG TABS tablet Take 1 tablet (20 mg total) by mouth daily with supper. To start after initial loading dose pack. 30 tablet 0  . sucralfate (CARAFATE) 1 GM/10ML suspension Take 10 mLs (1 g total) by mouth 4 (four) times daily -  with meals and at bedtime. 420 mL 0  . traZODone (DESYREL) 100 MG tablet Take 200 mg by mouth at bedtime.      No current facility-administered medications for this visit.    Allergies as of 09/05/2020 -  Review Complete 09/05/2020  Allergen Reaction Noted  . Wheat extract Swelling 10/29/2017    Family History  Problem Relation Age of Onset  . High Cholesterol Mother   . Hypertension Mother   . Arthritis/Rheumatoid Mother   . Alcohol abuse Father   . Colon cancer Neg Hx   . Colon polyps Neg Hx     Social History   Socioeconomic History  . Marital status: Widowed    Spouse name: Not on file  . Number of children: 2  . Years of education: Not on file  . Highest education level: Not on file  Occupational History  . Not on file  Tobacco Use  . Smoking status: Never Smoker  . Smokeless tobacco: Never Used  Vaping Use  . Vaping Use: Never used  Substance and Sexual Activity  . Alcohol use: Yes    Alcohol/week: 1.0 standard drink    Types: 1 Glasses of wine per week    Comment: drank alcohol for past 2 weeks, one glass of wine. Prior to this would drink glass of alcohol on weekends with dinner. 09/05/20: 3 glasses of  wine a day  . Drug use: No  . Sexual activity: Not Currently    Birth control/protection: Post-menopausal  Other Topics Concern  . Not on file  Social History Narrative  . Not on file   Social Determinants of Health   Financial Resource Strain: Not on file  Food Insecurity: Not on file  Transportation Needs: Not on file  Physical Activity: Not on file  Stress: Not on file  Social Connections: Not on file    Subjective: Review of Systems  Constitutional: Negative for chills, fever, malaise/fatigue and weight loss.  HENT: Negative for congestion and sore throat.   Respiratory: Negative for cough and shortness of breath.   Cardiovascular: Negative for chest pain and palpitations.  Gastrointestinal: Positive for abdominal pain, diarrhea and nausea. Negative for blood in stool, constipation, heartburn, melena and vomiting.  Musculoskeletal: Negative for joint pain and myalgias.  Skin: Negative for rash.  Neurological: Negative for dizziness and weakness.   Endo/Heme/Allergies: Does not bruise/bleed easily.  Psychiatric/Behavioral: Negative for depression. The patient is not nervous/anxious.   All other systems reviewed and are negative.    Objective: BP 137/82   Pulse 73   Temp (!) 96.6 F (35.9 C) (Temporal)   Ht _0  (1.626 m)   Wt 194 lb (88 kg)   BMI 33.30 kg/m  Physical Exam Vitals and nursing note reviewed.  Constitutional:      General: She is not in acute distress.    Appearance: Normal appearance. She is well-developed. She is obese. She is not ill-appearing, toxic-appearing or diaphoretic.  HENT:     Head: Normocephalic and atraumatic.     Nose: No congestion or rhinorrhea.  Eyes:     General: No scleral icterus. Cardiovascular:     Rate and Rhythm: Normal rate and regular rhythm.     Heart sounds: Normal heart sounds.  Pulmonary:     Effort: Pulmonary effort is normal. No respiratory distress.     Breath sounds: Normal breath sounds.  Abdominal:     General: Bowel sounds are normal.     Palpations: Abdomen is soft. There is no hepatomegaly, splenomegaly or mass.     Tenderness: There is no abdominal tenderness. There is no guarding or rebound.     Hernia: No hernia is present.  Skin:    General: Skin is warm and dry.     Coloration: Skin is not jaundiced.     Findings: No rash.  Neurological:     General: No focal deficit present.     Mental Status: She is alert and oriented to person, place, and time.  Psychiatric:        Attention and Perception: Attention normal.        Mood and Affect: Mood normal.        Speech: Speech normal.        Behavior: Behavior normal.        Thought Content: Thought content normal.        Cognition and Memory: Cognition and memory normal.      Assessment:  Very pleasant 65 year old female presents for follow-up on hospitalization for epigastric abdominal pain and peptic ulcer disease.  Overall she is doing improved, no red flag/warning signs or symptoms.  Diarrhea:  Still having loose stools, 3-4 stools a day.  States this is tolerable, no urgency.  She is not requesting treatment at this time.  States her stools are much improved compared to what they were.  This is been ongoing  for about a year.  I recommended over-the-counter treatments as needed.  Follow-up for any worsening diarrhea or if she begins to feel ill  Peptic ulcer disease: History of perforated gastric ulcer in 2021 status post exploratory laparotomy and repair.  She presented again recently to the emergency department was admitted for abdominal pain at which point she underwent an EGD which found peptic ulcer disease as per HPI.  Recommended 12-week repeat exam, she is currently due.  She does have some epigastric discomfort when she is stressed.  She does take naproxen about twice a day, was unaware of the length to high risk for peptic ulcer disease with NSAIDs.  We discussed using Tylenol only over-the-counter versus discussing with primary care about prescription pain management.  At this point she is on Xarelto for saddle pulmonary embolus managed by pulmonary critical care.  We will tentatively plan to schedule an EGD based on availability and will reach out to Dr. Joya Gaskins to see if it is okay to hold Xarelto for 48 hours prior.  Further decisions on scheduling to be made pending clearance.  Right upper quadrant abdominal pain: She has had right upper quadrant abdominal pain for some time.  Initially her exploratory laparotomy was performed due to findings on abdominal imaging which was initially ordered for possible cholecystitis.  She does still have right upper quadrant pain typically postprandial with associated nausea.  Her diarrhea could also be a result from biliary dyskinesia or chronic cholecystitis.  Right upper quadrant ultrasound completed in December showed multiple gallbladder stones, no CBD dilation.  No recent elevated LFTs to make me concerned about choledocholithiasis.  At this point  I will check a HIDA scan.  We will likely eventually refer her back to surgery to evaluate for possible surgical candidacy for laparoscopic cholecystectomy.   Proceed with EGD on propofol/MAC with Dr. Gala Romney in near future: the risks, benefits, and alternatives have been discussed with the patient in detail. The patient states understanding and desires to proceed.  The patient is currently on Celexa, Neurontin, trazodone, Xarelto.  The patient is not on any other anticoagulants, anxiolytics, chronic pain medications, antidepressants, antidiabetics, or iron supplements.she is we will contact prescriber of Xarelto for permission to hold as per above.  We will plan for procedure on propofol/MAC to promote adequate sedation.  ASA III  Plan: 1. Continue current meds including Protonix 40 mg twice daily 2. Plan for EGD as per above 3. HIDA scan 4. Calls for any worsening or severe symptoms 5. Follow-up in 2 months.    Thank you for allowing [place] to participate in the care of [facility]  Walden Field, DNP, AGNP-C Adult & Gerontological Nurse Practitioner [facility]   09/05/2020 10:24 AM   Disclaimer: This note was dictated with voice recognition software. Similar sounding words can inadvertently be transcribed and may not be corrected upon review.

## 2020-09-05 NOTE — Telephone Encounter (Signed)
Yes it is ok to hold for 48hours

## 2020-09-06 ENCOUNTER — Ambulatory Visit (HOSPITAL_COMMUNITY): Payer: Medicaid Other | Admitting: Clinical

## 2020-09-06 NOTE — Telephone Encounter (Signed)
Called pt to schedule. Received message "not accepting calls at this time, try your call again later"

## 2020-09-12 ENCOUNTER — Encounter (HOSPITAL_COMMUNITY): Admission: RE | Admit: 2020-09-12 | Payer: Medicaid Other | Source: Ambulatory Visit

## 2020-09-12 ENCOUNTER — Telehealth: Payer: Self-pay | Admitting: Internal Medicine

## 2020-09-12 NOTE — Telephone Encounter (Signed)
Called patient, no answer, unable to leave voicemail.   Called patient to schedule a follow up visit with Dr. Joya Gaskins ASAP. If patient calls back, please transfer call to office so a slot can be double booked to get her in.

## 2020-09-16 ENCOUNTER — Other Ambulatory Visit: Payer: Self-pay | Admitting: Internal Medicine

## 2020-09-16 DIAGNOSIS — I10 Essential (primary) hypertension: Secondary | ICD-10-CM

## 2020-09-16 DIAGNOSIS — K279 Peptic ulcer, site unspecified, unspecified as acute or chronic, without hemorrhage or perforation: Secondary | ICD-10-CM

## 2020-09-16 DIAGNOSIS — I2602 Saddle embolus of pulmonary artery with acute cor pulmonale: Secondary | ICD-10-CM

## 2020-09-16 NOTE — Telephone Encounter (Signed)
Notes to clinic: review for refill  Looks like patient was needing to have a appt   Requested Prescriptions  Pending Prescriptions Disp Refills   sucralfate (CARAFATE) 1 GM/10ML suspension 420 mL 0    Sig: Take 10 mLs (1 g total) by mouth 4 (four) times daily -  with meals and at bedtime.      Gastroenterology: Antiacids Passed - 09/16/2020  5:08 PM      Passed - Valid encounter within last 12 months    Recent Outpatient Visits           1 month ago Severe episode of recurrent major depressive disorder, without psychotic features [facility])   [place] Elsie Stain, MD   1 month ago Hospital discharge follow-up   [place] Ladell Pier, MD                  rivaroxaban (XARELTO) 20 MG TABS tablet 30 tablet 0    Sig: Take 1 tablet (20 mg total) by mouth daily with supper. To start after initial loading dose pack.      Hematology: Anticoagulants - rivaroxaban Failed - 09/16/2020  5:08 PM      Failed - HCT in normal range and within 360 days    HCT  Date Value Ref Range Status  08/07/2020 33.0 (L) 36.0 - 46.0 % Final          Failed - HGB in normal range and within 360 days    Hemoglobin  Date Value Ref Range Status  08/07/2020 10.9 (L) 12.0 - 15.0 g/dL Final          Passed - ALT in normal range and within 180 days    ALT  Date Value Ref Range Status  08/07/2020 11 0 - 44 U/L Final          Passed - AST in normal range and within 180 days    AST  Date Value Ref Range Status  08/07/2020 21 15 - 41 U/L Final          Passed - Cr in normal range and within 360 days    Creatinine, Ser  Date Value Ref Range Status  08/07/2020 0.73 0.44 - 1.00 mg/dL Final          Passed - PLT in normal range and within 360 days    Platelets  Date Value Ref Range Status  08/07/2020 225 150 - 400 K/uL Final          Passed - Valid encounter within last 12 months    Recent Outpatient Visits            1 month ago Severe episode of recurrent major depressive disorder, without psychotic features ([place])   [place] Elsie Stain, MD   1 month ago Hospital discharge follow-up   [place] Ladell Pier, MD                  pantoprazole (PROTONIX) 40 MG tablet 60 tablet 0    Sig: Take 1 tablet (40 mg total) by mouth 2 (two) times daily.      Gastroenterology: Proton Pump Inhibitors Passed - 09/16/2020  5:08 PM      Passed - Valid encounter within last 12 months    Recent Outpatient Visits           1 month ago Severe  episode of recurrent major depressive disorder, without psychotic features [facility])   [place] Elsie Stain, MD   1 month ago Hospital discharge follow-up   [place] Ladell Pier, MD                  ondansetron [facility] ODT) 8 MG disintegrating tablet 20 tablet 0    Sig: Take 1 tablet (8 mg total) by mouth every 8 (eight) hours as needed for nausea or vomiting.      Not Delegated - Gastroenterology: Antiemetics Failed - 09/16/2020  5:08 PM      Failed - This refill cannot be delegated      Passed - Valid encounter within last 6 months    Recent Outpatient Visits           1 month ago Severe episode of recurrent major depressive disorder, without psychotic features [facility])   [place] Elsie Stain, MD   1 month ago Hospital discharge follow-up   [place] Ladell Pier, MD                  lisinopril-hydrochlorothiazide (ZESTORETIC) 10-12.5 MG tablet 90 tablet 1    Sig: Take 1 tablet by mouth daily.      Cardiovascular:  ACEI + Diuretic Combos Failed - 09/16/2020  5:08 PM      Failed - K in normal range and within 180 days    Potassium  Date Value Ref Range Status  08/07/2020 3.2 (L) 3.5 - 5.1 mmol/L Final          Passed  - Na in normal range and within 180 days    Sodium  Date Value Ref Range Status  08/07/2020 141 135 - 145 mmol/L Final          Passed - Cr in normal range and within 180 days    Creatinine, Ser  Date Value Ref Range Status  08/07/2020 0.73 0.44 - 1.00 mg/dL Final          Passed - Ca in normal range and within 180 days    Calcium  Date Value Ref Range Status  08/07/2020 9.2 8.9 - 10.3 mg/dL Final          Passed - Patient is not pregnant      Passed - Last BP in normal range    BP Readings from Last 1 Encounters:  09/05/20 137/82          Passed - Valid encounter within last 6 months    Recent Outpatient Visits           1 month ago Severe episode of recurrent major depressive disorder, without psychotic features ([place])   [place] Elsie Stain, MD   1 month ago Hospital discharge follow-up   [place] Ladell Pier, MD                  donepezil (ARICEPT) 10 MG tablet      Sig: Take 0.5-1 tablets (5-10 mg total) by mouth in the morning and at bedtime.      Neurology:  Alzheimer's Agents Passed - 09/16/2020  5:08 PM      Passed - Valid encounter within last 6 months    Recent Outpatient Visits           1 month ago  Severe episode of recurrent major depressive disorder, without psychotic features [facility])   [place] Elsie Stain, MD   1 month ago Hospital discharge follow-up   [place] Ladell Pier, MD                  diclofenac Sodium (VOLTAREN) 1 % GEL 50 g 1    Sig: Apply 2 g topically 4 (four) times daily.      Analgesics:  Topicals Passed - 09/16/2020  5:08 PM      Passed - Valid encounter within last 12 months    Recent Outpatient Visits           1 month ago Severe episode of recurrent major depressive disorder, without psychotic features [facility])   [place]  Elsie Stain, MD   1 month ago Hospital discharge follow-up   [place] Ladell Pier, MD

## 2020-09-16 NOTE — Telephone Encounter (Signed)
Copied from [place] [phone]. Topic: Quick Communication - Rx Refill/Question >> Sep 16, 2020  4:18 PM Leward Quan A wrote: Medication: diclofenac Sodium (VOLTAREN) 1 % GEL, donepezil (ARICEPT) 10 MG tablet, lisinopril-hydrochlorothiazide (ZESTORETIC) 10-12.5 MG tablet, ondansetron (ZOFRAN ODT) 8 MG disintegrating tablet, pantoprazole (PROTONIX) 40 MG tablet, rivaroxaban (XARELTO) 20 MG TABS tablet, sucralfate (CARAFATE) 1 GM/10ML suspension Requesting 3 month supply each   Has the patient contacted their pharmacy? Yes.   (Agent: If no, request that the patient contact the pharmacy for the refill.) (Agent: If yes, when and what did the pharmacy advise?)  Preferred Pharmacy (with phone number or street name): [place], [place] - [id number] [place]  Phone:  [phone] Fax:  [phone]     Agent: Please be advised that RX refills may take up to 3 business days. We ask that you follow-up with your pharmacy.

## 2020-09-18 ENCOUNTER — Encounter (HOSPITAL_COMMUNITY)
Admission: RE | Admit: 2020-09-18 | Discharge: 2020-09-18 | Disposition: A | Discharge status: Home / Self Care | Payer: Medicaid Other | Source: Ambulatory Visit | Attending: Nurse Practitioner | Admitting: Nurse Practitioner

## 2020-09-18 ENCOUNTER — Encounter (HOSPITAL_COMMUNITY): Payer: Self-pay

## 2020-09-18 ENCOUNTER — Other Ambulatory Visit: Payer: Self-pay

## 2020-09-18 DIAGNOSIS — K269 Duodenal ulcer, unspecified as acute or chronic, without hemorrhage or perforation: Secondary | ICD-10-CM | POA: Diagnosis present

## 2020-09-18 DIAGNOSIS — R1010 Upper abdominal pain, unspecified: Secondary | ICD-10-CM | POA: Diagnosis present

## 2020-09-18 DIAGNOSIS — K8020 Calculus of gallbladder without cholecystitis without obstruction: Secondary | ICD-10-CM | POA: Diagnosis present

## 2020-09-18 DIAGNOSIS — I2692 Saddle embolus of pulmonary artery without acute cor pulmonale: Secondary | ICD-10-CM | POA: Diagnosis present

## 2020-09-18 DIAGNOSIS — K275 Chronic or unspecified peptic ulcer, site unspecified, with perforation: Secondary | ICD-10-CM

## 2020-09-18 HISTORY — DX: Systemic involvement of connective tissue, unspecified: M35.9

## 2020-09-18 MED ORDER — TECHNETIUM TC 99M MEBROFENIN IV KIT
5.0000 | PACK | Freq: Once | INTRAVENOUS | Status: AC | PRN
  Administered 2020-09-18: 5.3 via INTRAVENOUS

## 2020-09-20 MED ORDER — SUCRALFATE 1 GM/10ML PO SUSP: 1.0000 g | Freq: Three times a day (TID) | ORAL | 0 refills | Status: DC

## 2020-09-20 MED ORDER — LISINOPRIL-HYDROCHLOROTHIAZIDE 10-12.5 MG PO TABS: 1.0000 | ORAL_TABLET | Freq: Every day | ORAL | 1 refills | Status: DC

## 2020-09-20 MED ORDER — DICLOFENAC SODIUM 1 % EX GEL: 2.0000 g | Freq: Four times a day (QID) | CUTANEOUS | 2 refills | Status: DC

## 2020-09-20 MED ORDER — DONEPEZIL HCL 10 MG PO TABS: 5.0000 mg | ORAL_TABLET | Freq: Two times a day (BID) | ORAL | 2 refills | Status: DC

## 2020-09-20 MED ORDER — RIVAROXABAN 20 MG PO TABS: 20.0000 mg | ORAL_TABLET | Freq: Every day | ORAL | 2 refills | Status: DC

## 2020-09-20 MED ORDER — ONDANSETRON 8 MG PO TBDP: 8.0000 mg | ORAL_TABLET | Freq: Three times a day (TID) | ORAL | 0 refills | Status: DC | PRN

## 2020-09-20 MED ORDER — PANTOPRAZOLE SODIUM 40 MG PO TBEC: 40.0000 mg | DELAYED_RELEASE_TABLET | Freq: Two times a day (BID) | ORAL | 0 refills | Status: DC

## 2020-09-23 ENCOUNTER — Ambulatory Visit (INDEPENDENT_AMBULATORY_CARE_PROVIDER_SITE_OTHER): Payer: Medicaid Other | Admitting: Clinical

## 2020-09-23 ENCOUNTER — Other Ambulatory Visit: Payer: Self-pay

## 2020-09-23 ENCOUNTER — Inpatient Hospital Stay (HOSPITAL_COMMUNITY)
Admission: EM | Admit: 2020-09-23 | Discharge: 2020-09-27 | DRG: 392 | Disposition: A | Discharge status: Home / Self Care | Payer: Medicaid Other | Attending: Family Medicine | Admitting: Family Medicine

## 2020-09-23 ENCOUNTER — Telehealth: Payer: Self-pay | Admitting: Internal Medicine

## 2020-09-23 DIAGNOSIS — Z79899 Other long term (current) drug therapy: Secondary | ICD-10-CM

## 2020-09-23 DIAGNOSIS — D6859 Other primary thrombophilia: Secondary | ICD-10-CM | POA: Diagnosis present

## 2020-09-23 DIAGNOSIS — E876 Hypokalemia: Secondary | ICD-10-CM | POA: Diagnosis present

## 2020-09-23 DIAGNOSIS — R197 Diarrhea, unspecified: Secondary | ICD-10-CM

## 2020-09-23 DIAGNOSIS — Z1231 Encounter for screening mammogram for malignant neoplasm of breast: Secondary | ICD-10-CM

## 2020-09-23 DIAGNOSIS — I10 Essential (primary) hypertension: Secondary | ICD-10-CM

## 2020-09-23 DIAGNOSIS — Z9119 Patient's noncompliance with other medical treatment and regimen: Secondary | ICD-10-CM

## 2020-09-23 DIAGNOSIS — F332 Major depressive disorder, recurrent severe without psychotic features: Secondary | ICD-10-CM

## 2020-09-23 DIAGNOSIS — Z86711 Personal history of pulmonary embolism: Secondary | ICD-10-CM

## 2020-09-23 DIAGNOSIS — K529 Noninfective gastroenteritis and colitis, unspecified: Principal | ICD-10-CM | POA: Diagnosis present

## 2020-09-23 DIAGNOSIS — Z7901 Long term (current) use of anticoagulants: Secondary | ICD-10-CM

## 2020-09-23 DIAGNOSIS — R112 Nausea with vomiting, unspecified: Secondary | ICD-10-CM

## 2020-09-23 DIAGNOSIS — K8010 Calculus of gallbladder with chronic cholecystitis without obstruction: Secondary | ICD-10-CM | POA: Diagnosis present

## 2020-09-23 DIAGNOSIS — Z85828 Personal history of other malignant neoplasm of skin: Secondary | ICD-10-CM

## 2020-09-23 DIAGNOSIS — K219 Gastro-esophageal reflux disease without esophagitis: Secondary | ICD-10-CM | POA: Diagnosis present

## 2020-09-23 DIAGNOSIS — Z8249 Family history of ischemic heart disease and other diseases of the circulatory system: Secondary | ICD-10-CM

## 2020-09-23 DIAGNOSIS — Z91018 Allergy to other foods: Secondary | ICD-10-CM

## 2020-09-23 DIAGNOSIS — Z8711 Personal history of peptic ulcer disease: Secondary | ICD-10-CM

## 2020-09-23 DIAGNOSIS — D649 Anemia, unspecified: Secondary | ICD-10-CM | POA: Diagnosis present

## 2020-09-23 DIAGNOSIS — F411 Generalized anxiety disorder: Secondary | ICD-10-CM | POA: Diagnosis not present

## 2020-09-23 DIAGNOSIS — Z791 Long term (current) use of non-steroidal anti-inflammatories (NSAID): Secondary | ICD-10-CM

## 2020-09-23 DIAGNOSIS — E860 Dehydration: Secondary | ICD-10-CM

## 2020-09-23 DIAGNOSIS — M170 Bilateral primary osteoarthritis of knee: Secondary | ICD-10-CM

## 2020-09-23 DIAGNOSIS — K2980 Duodenitis without bleeding: Secondary | ICD-10-CM | POA: Diagnosis present

## 2020-09-23 DIAGNOSIS — F1020 Alcohol dependence, uncomplicated: Secondary | ICD-10-CM

## 2020-09-23 DIAGNOSIS — K2970 Gastritis, unspecified, without bleeding: Secondary | ICD-10-CM | POA: Diagnosis present

## 2020-09-23 DIAGNOSIS — K819 Cholecystitis, unspecified: Secondary | ICD-10-CM

## 2020-09-23 DIAGNOSIS — E669 Obesity, unspecified: Secondary | ICD-10-CM | POA: Diagnosis present

## 2020-09-23 DIAGNOSIS — Z6832 Body mass index (BMI) 32.0-32.9, adult: Secondary | ICD-10-CM

## 2020-09-23 DIAGNOSIS — Z20822 Contact with and (suspected) exposure to covid-19: Secondary | ICD-10-CM | POA: Diagnosis present

## 2020-09-23 NOTE — Telephone Encounter (Signed)
I spoke with the Patient and she said she does need the referral. She stated it's not for a regular mammogram it's for a diagnostic mammogram.

## 2020-09-23 NOTE — Telephone Encounter (Signed)
Pt doesn't need a referral. Pt need to call [facility] at [phone] to schedule her appointment.

## 2020-09-23 NOTE — Telephone Encounter (Signed)
Pt is needing a diagnostic mm orders to [facility]

## 2020-09-23 NOTE — ED Triage Notes (Signed)
N/V/D x2 days From home
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
## 2020-09-23 NOTE — Telephone Encounter (Signed)
Copied from [place] [id number]. Topic: Quick Communication - See Telephone Encounter >> Sep 16, 2020  4:35 PM Loma Boston wrote: CRM for notification. See Telephone encounter for: 09/16/20. Pt wants a mammogram and referral appt is not acceptable to her as states they are watching to make sure she does not have cancer. Pt request referral now. Wants referral at [facility]. FU with pt for further questions  [phone]

## 2020-09-23 NOTE — Addendum Note (Signed)
Addended by: Karle Plumber B on: 09/23/2020 09:34 PM   Modules accepted: Orders

## 2020-09-23 NOTE — Telephone Encounter (Signed)
Pt has returned call and wants [place] to return call about this referral. [phone].

## 2020-09-24 ENCOUNTER — Observation Stay (HOSPITAL_COMMUNITY): Payer: Medicaid Other

## 2020-09-24 ENCOUNTER — Emergency Department (HOSPITAL_COMMUNITY): Payer: Medicaid Other

## 2020-09-24 ENCOUNTER — Encounter (HOSPITAL_COMMUNITY): Payer: Self-pay | Admitting: Internal Medicine

## 2020-09-24 DIAGNOSIS — R112 Nausea with vomiting, unspecified: Secondary | ICD-10-CM | POA: Diagnosis not present

## 2020-09-24 DIAGNOSIS — R1010 Upper abdominal pain, unspecified: Secondary | ICD-10-CM | POA: Diagnosis not present

## 2020-09-24 DIAGNOSIS — K529 Noninfective gastroenteritis and colitis, unspecified: Secondary | ICD-10-CM | POA: Diagnosis present

## 2020-09-24 DIAGNOSIS — R197 Diarrhea, unspecified: Secondary | ICD-10-CM

## 2020-09-24 LAB — URINALYSIS, ROUTINE W REFLEX MICROSCOPIC
Bacteria, UA: NONE SEEN
Bilirubin Urine: NEGATIVE
Glucose, UA: NEGATIVE mg/dL
Hgb urine dipstick: NEGATIVE
Ketones, ur: 20 mg/dL — AB
Nitrite: NEGATIVE
Protein, ur: NEGATIVE mg/dL
Specific Gravity, Urine: 1.044 — ABNORMAL HIGH (ref 1.005–1.030)
pH: 6 (ref 5.0–8.0)

## 2020-09-24 LAB — COMPREHENSIVE METABOLIC PANEL WITH GFR
ALT: 10 U/L (ref 0–44)
AST: 20 U/L (ref 15–41)
Albumin: 4.2 g/dL (ref 3.5–5.0)
Alkaline Phosphatase: 45 U/L (ref 38–126)
Anion gap: 8 (ref 5–15)
BUN: 14 mg/dL (ref 8–23)
CO2: 23 mmol/L (ref 22–32)
Calcium: 9 mg/dL (ref 8.9–10.3)
Chloride: 105 mmol/L (ref 98–111)
Creatinine, Ser: 0.75 mg/dL (ref 0.44–1.00)
GFR, Estimated: >60 mL/min
Glucose, Bld: 101 mg/dL — ABNORMAL HIGH (ref 70–99)
Potassium: 3.7 mmol/L (ref 3.5–5.1)
Sodium: 136 mmol/L (ref 135–145)
Total Bilirubin: 0.6 mg/dL (ref 0.3–1.2)
Total Protein: 7.1 g/dL (ref 6.5–8.1)

## 2020-09-24 LAB — CBC WITH DIFFERENTIAL/PLATELET
Abs Immature Granulocytes: 0.03 10*3/uL (ref 0.00–0.07)
Basophils Absolute: 0 10*3/uL (ref 0.0–0.1)
Basophils Relative: 0 %
Eosinophils Absolute: 0 10*3/uL (ref 0.0–0.5)
Eosinophils Relative: 1 %
HCT: 33.5 % — ABNORMAL LOW (ref 36.0–46.0)
Hemoglobin: 11 g/dL — ABNORMAL LOW (ref 12.0–15.0)
Immature Granulocytes: 1 %
Lymphocytes Relative: 16 %
Lymphs Abs: 1.1 10*3/uL (ref 0.7–4.0)
MCH: 28.1 pg (ref 26.0–34.0)
MCHC: 32.8 g/dL (ref 30.0–36.0)
MCV: 85.5 fL (ref 80.0–100.0)
Monocytes Absolute: 0.4 10*3/uL (ref 0.1–1.0)
Monocytes Relative: 6 %
Neutro Abs: 5 10*3/uL (ref 1.7–7.7)
Neutrophils Relative %: 76 %
Platelets: 195 10*3/uL (ref 150–400)
RBC: 3.92 MIL/uL (ref 3.87–5.11)
RDW: 15.3 % (ref 11.5–15.5)
WBC: 6.5 10*3/uL (ref 4.0–10.5)
nRBC: 0 % (ref 0.0–0.2)

## 2020-09-24 LAB — LIPASE, BLOOD: Lipase: 33 U/L (ref 11–51)

## 2020-09-24 LAB — D-DIMER, QUANTITATIVE: D-Dimer, Quant: 0.45 ug{FEU}/mL (ref 0.00–0.50)

## 2020-09-24 LAB — SARS CORONAVIRUS 2 BY RT PCR (HOSPITAL ORDER, PERFORMED IN ~~LOC~~ HOSPITAL LAB): SARS Coronavirus 2: NEGATIVE

## 2020-09-24 MED ORDER — SODIUM CHLORIDE 0.9 % IV BOLUS
1000.0000 mL | Freq: Once | INTRAVENOUS | Status: AC
  Administered 2020-09-24: 1000 mL via INTRAVENOUS

## 2020-09-24 MED ORDER — ONDANSETRON HCL 4 MG/2ML IJ SOLN
4.0000 mg | Freq: Once | INTRAMUSCULAR | Status: AC
  Administered 2020-09-24: 4 mg via INTRAVENOUS
  Filled 2020-09-24: qty 2

## 2020-09-24 MED ORDER — FENTANYL CITRATE (PF) 100 MCG/2ML IJ SOLN
50.0000 ug | Freq: Once | INTRAMUSCULAR | Status: AC
Start: 2020-09-24 — End: 2020-09-24
  Administered 2020-09-24: 50 ug via INTRAVENOUS
  Filled 2020-09-24: qty 2

## 2020-09-24 MED ORDER — FAMOTIDINE IN NACL 20-0.9 MG/50ML-% IV SOLN
20.0000 mg | Freq: Once | INTRAVENOUS | Status: AC
  Administered 2020-09-24: 20 mg via INTRAVENOUS
  Filled 2020-09-24: qty 50

## 2020-09-24 MED ORDER — TRAZODONE HCL 50 MG PO TABS
200.0000 mg | ORAL_TABLET | Freq: Every day | ORAL | Status: DC
  Administered 2020-09-24 – 2020-09-26 (×3): 200 mg via ORAL
  Filled 2020-09-24 (×4): qty 4
  Filled 2020-09-24: qty 2
  Filled 2020-09-24: qty 4

## 2020-09-24 MED ORDER — SUCRALFATE 1 GM/10ML PO SUSP
1.0000 g | Freq: Three times a day (TID) | ORAL | Status: DC
  Administered 2020-09-24 – 2020-09-27 (×13): 1 g via ORAL
  Filled 2020-09-24 (×25): qty 10

## 2020-09-24 MED ORDER — SODIUM CHLORIDE 0.9 % IV BOLUS
500.0000 mL | Freq: Once | INTRAVENOUS | Status: AC
  Administered 2020-09-24: 500 mL via INTRAVENOUS

## 2020-09-24 MED ORDER — ONDANSETRON HCL 4 MG PO TABS
4.0000 mg | ORAL_TABLET | Freq: Four times a day (QID) | ORAL | Status: DC | PRN
  Filled 2020-09-24: qty 1

## 2020-09-24 MED ORDER — ACETAMINOPHEN 325 MG PO TABS
650.0000 mg | ORAL_TABLET | Freq: Four times a day (QID) | ORAL | Status: DC | PRN
  Administered 2020-09-27: 650 mg via ORAL
  Filled 2020-09-24: qty 2

## 2020-09-24 MED ORDER — SODIUM CHLORIDE 0.9 % IV SOLN
3.0000 g | Freq: Once | INTRAVENOUS | Status: AC
  Administered 2020-09-24: 3 g via INTRAVENOUS
  Filled 2020-09-24: qty 8

## 2020-09-24 MED ORDER — ACETAMINOPHEN 650 MG RE SUPP
650.0000 mg | Freq: Four times a day (QID) | RECTAL | Status: DC | PRN
  Filled 2020-09-24: qty 1

## 2020-09-24 MED ORDER — CITALOPRAM HYDROBROMIDE 20 MG PO TABS
20.0000 mg | ORAL_TABLET | Freq: Every day | ORAL | Status: DC
  Administered 2020-09-25 – 2020-09-27 (×3): 20 mg via ORAL
  Filled 2020-09-24 (×6): qty 1

## 2020-09-24 MED ORDER — ONDANSETRON HCL 4 MG/2ML IJ SOLN
4.0000 mg | Freq: Four times a day (QID) | INTRAMUSCULAR | Status: DC | PRN
  Administered 2020-09-25: 4 mg via INTRAVENOUS
  Filled 2020-09-24: qty 2

## 2020-09-24 MED ORDER — IOHEXOL 300 MG/ML  SOLN
100.0000 mL | Freq: Once | INTRAMUSCULAR | Status: AC | PRN
  Administered 2020-09-24: 100 mL via INTRAVENOUS

## 2020-09-24 MED ORDER — DONEPEZIL HCL 5 MG PO TABS
5.0000 mg | ORAL_TABLET | Freq: Every day | ORAL | Status: DC
  Administered 2020-09-24 – 2020-09-26 (×3): 5 mg via ORAL
  Filled 2020-09-24 (×6): qty 1

## 2020-09-24 MED ORDER — PANTOPRAZOLE SODIUM 40 MG IV SOLR
40.0000 mg | Freq: Two times a day (BID) | INTRAVENOUS | Status: DC
  Administered 2020-09-24 – 2020-09-27 (×7): 40 mg via INTRAVENOUS
  Filled 2020-09-24 (×13): qty 40

## 2020-09-24 MED ORDER — LACTATED RINGERS IV SOLN: INTRAVENOUS | Status: DC

## 2020-09-24 MED ORDER — FENTANYL CITRATE (PF) 100 MCG/2ML IJ SOLN
50.0000 ug | Freq: Once | INTRAMUSCULAR | Status: AC
  Administered 2020-09-24: 50 ug via INTRAVENOUS
  Filled 2020-09-24: qty 2

## 2020-09-24 MED ORDER — GABAPENTIN 300 MG PO CAPS
300.0000 mg | ORAL_CAPSULE | Freq: Two times a day (BID) | ORAL | Status: DC
  Administered 2020-09-24 – 2020-09-27 (×7): 300 mg via ORAL
  Filled 2020-09-24 (×6): qty 1

## 2020-09-24 NOTE — Progress Notes (Signed)
Dr Constance Haw in to check pt. No new orders given.

## 2020-09-24 NOTE — Progress Notes (Signed)
Admitted to pre-op #4. Alert/oriented. resp adequate/nonlabored. BBS clear/equal on auscultation. IV infusing without difficulty. Undressed. Hospital gown applied. Transferred to bed. Connected to monitors. Denies pain. Resting quietly in bed watching TV.

## 2020-09-24 NOTE — Telephone Encounter (Signed)
Contacted pt to make aware that Dr. Wynetta Emery has placed the referral pt didn't answer and was unable to lvm due to vm not being setup

## 2020-09-24 NOTE — Consult Note (Addendum)
@LOGO @   Referring Provider: Dr. Manuella Ghazi Primary Care Physician:  Ladell Pier, MD Primary Gastroenterologist:  Dr. Gala Romney  Date of Admission: 09/23/2020 Date of Consultation: 09/24/2020  Reason for Consultation: Peptic and duodenal ulcer disease.  HPI:  Maria Murphy is a 65 y.o. year old female with medical history significant for PUD/duodenal ulcers with perforated prepyloric gastric ulcer (chronic NSAID use) in August 2021 s/p exploratory laparotomy, complicated by abdominal abscess and saddle PE, on Xarelto.  Also with history of HTN, cholelithiasis, and obesity.  Patient presented to the ED 1/31 with worsening diarrhea and nausea with vomiting x2 days. Prior hospitalization in November for worsening abdominal pain, nausea, vomiting, and CT with questionable fistulous formation versus ulceration.  Underwent EGD during admission revealing normal mucosa of esophagus and GE junction, 5 cm sliding hiatal hernia, 2 antral scars in the otherwise normal examined stomach, deformed but patent pylorus, 2 nonbleeding cratered duodenal ulcers with no stigmata of bleeding with suture in the middle of the ulcer along the posterior wall, noncritical narrowing of at the angle of duodenum.  H. pylori serologies were negative.  ED Course: Vital signs stable patient is afebrile.  Laboratory data remarkable for hemoglobin of 11 (stable) with normocytic indices, CT abdomen with circumferentially thickened appearance of duodenal fold and gastric antrum, mild mucosal hyperemia and possible ulceration.  No evidence of perforation or abscess.  Also with cholelithiasis with slight increasing pericholecystic inflammatory change. Follow-up abdominal ultrasound with findings of cholelithiasis and mild pericholecystic fluid, but otherwise insignificant.  Chest x-ray with no acute findings.  Covid testing pending.  No leukocytosis or LFT elevation noted.    Lipase normal.  Today:  Nausea, vomiting, abdominal pain, and  diarrhea started 2 days ago. Prior to yesterday, she was having light RUQ pain postprandially. 2 days ago she developed severe abdominal pain that was persistent with associated waxing and waning cramping. Couldn't eat because she was having nausea/vomiting.   Taking Protonix BID and carafate QID at home.  Tylenol extra strength. Stopped naproxen and Aleve after seeing Eric on 1/13.  Abdominal pain is very minimal at this time. No nausea. No vomiting since presenting to the emergency room.  Chronic diarrhea with 3-4 loose BMs daily post prandially. Started around September 2021. No nocturnal stools typically. Associated urgency. Reports diarrhea worsens if she gets upset or stressed. Had dark stools twice about 2 weeks ago. No brbpr. No family history of IBD or colon cancer. 2 days ago, she started having 10-15 BMs a day. States she was stressed because her sister wants her to become a Restaurant manager, fast food. No recent antibiotics. No travel. No eating out or undercooked meats. No sick contacts. Had been taking dicyclomine for 2 months which helped her diarrhea and allowed her to have more form to her stool. Dicyclomine also helped with her abdominal pain. Finished course about 10-12 days ago.   1 soft/mushy BM last night after presenting to the hospital. No BM today.   Last dose of Xarelto yesterday morning.  Past Medical History:  Diagnosis Date  . Collagen vascular disease ([place])   . Essential hypertension, benign 01/24/2015  . Foot ulcer ([place])   . Hypertension   . Memory loss   . PUD (peptic ulcer disease)    with perforation s/p exploratory laparotomy  . Skin cancer   . Vision abnormalities     Past Surgical History:  Procedure Laterality Date  . CENTRAL LINE INSERTION Right 04/22/2020   Procedure: CENTRAL LINE INSERTION;  Surgeon: Constance Haw,  Lanell Matar, MD;  Location: [facility];  Service: General;  Laterality: Right;  . ESOPHAGOGASTRODUODENOSCOPY (EGD) WITH PROPOFOL N/A 07/10/2020   Procedure:  ESOPHAGOGASTRODUODENOSCOPY (EGD) WITH PROPOFOL;  Surgeon: Rogene Houston, MD;  Location: [facility];  Service: Endoscopy;  Laterality: N/A;  . GASTRORRHAPHY  04/22/2020   Procedure: GASTRORRHAPHY;  Surgeon: Virl Cagey, MD;  Location: [facility];  Service: General;;  . IR CATHETER TUBE CHANGE  05/09/2020  . LAPAROTOMY N/A 04/22/2020   Procedure: EXPLORATORY LAPAROTOMY;  Surgeon: Virl Cagey, MD;  Location: [facility];  Service: General;  Laterality: N/A;  . TONSILLECTOMY      Prior to Admission medications   Medication Sig Start Date End Date Taking? Authorizing Provider  acetaminophen (TYLENOL) 500 MG tablet Take 1 tablet (500 mg total) by mouth every 6 (six) hours as needed. Patient taking differently: Take 500 mg by mouth every 6 (six) hours as needed for mild pain or fever. 08/10/18  Yes Idol, Almyra Free, PA-C  citalopram (CELEXA) 20 MG tablet Take 1 tablet (20 mg total) by mouth daily. 08/16/19  Yes Jaynee Eagles, PA-C  diclofenac Sodium (VOLTAREN) 1 % GEL Apply 2 g topically 4 (four) times daily. 09/20/20  Yes Ladell Pier, MD  donepezil (ARICEPT) 10 MG tablet Take 0.5-1 tablets (5-10 mg total) by mouth in the morning and at bedtime. 09/20/20  Yes Ladell Pier, MD  gabapentin (NEURONTIN) 300 MG capsule Take 300 mg by mouth 2 (two) times daily.  08/23/19  Yes [provider]  lisinopril-hydrochlorothiazide (ZESTORETIC) 10-12.5 MG tablet Take 1 tablet by mouth daily. 09/20/20  Yes Ladell Pier, MD  ondansetron (ZOFRAN ODT) 8 MG disintegrating tablet Take 1 tablet (8 mg total) by mouth every 8 (eight) hours as needed for nausea or vomiting. 09/20/20  Yes Ladell Pier, MD  pantoprazole (PROTONIX) 40 MG tablet Take 1 tablet (40 mg total) by mouth 2 (two) times daily. 09/20/20 10/20/20 Yes Ladell Pier, MD  rivaroxaban (XARELTO) 20 MG TABS tablet Take 1 tablet (20 mg total) by mouth daily with supper. To start after initial loading dose pack. 09/20/20  Yes  Ladell Pier, MD  sucralfate (CARAFATE) 1 GM/10ML suspension Take 10 mLs (1 g total) by mouth 4 (four) times daily -  with meals and at bedtime. 09/20/20  Yes Ladell Pier, MD  traZODone (DESYREL) 100 MG tablet Take 200 mg by mouth at bedtime.  08/02/19  Yes [provider]  dicyclomine (BENTYL) 10 MG capsule Take 1 capsule (10 mg total) by mouth 2 (two) times daily before a meal. 07/12/20 08/11/20  Manuella Ghazi, Pratik D, DO    Current Facility-Administered Medications  Medication Dose Route Frequency Provider Last Rate Last Admin  . acetaminophen (TYLENOL) tablet 650 mg  650 mg Oral Q6H PRN Manuella Ghazi, Pratik D, DO       Or  . acetaminophen (TYLENOL) suppository 650 mg  650 mg Rectal Q6H PRN Manuella Ghazi, Pratik D, DO      . citalopram (CELEXA) tablet 20 mg  20 mg Oral Daily Shah, Pratik D, DO      . donepezil (ARICEPT) tablet 5 mg  5 mg Oral QHS Shah, Pratik D, DO      . gabapentin (NEURONTIN) capsule 300 mg  300 mg Oral BID Manuella Ghazi, Pratik D, DO      . lactated ringers infusion   Intravenous Continuous Shah, Pratik D, DO      . ondansetron (ZOFRAN) tablet 4 mg  4 mg  Oral Q6H PRN Manuella Ghazi, Pratik D, DO       Or  . ondansetron (ZOFRAN) injection 4 mg  4 mg Intravenous Q6H PRN Manuella Ghazi, Pratik D, DO      . pantoprazole (PROTONIX) injection 40 mg  40 mg Intravenous Q12H Shah, Pratik D, DO      . sucralfate (CARAFATE) 1 GM/10ML suspension 1 g  1 g Oral TID WC & HS Shah, Pratik D, DO      . traZODone (DESYREL) tablet 200 mg  200 mg Oral QHS Manuella Ghazi, Pratik D, DO       Current Outpatient Medications  Medication Sig Dispense Refill  . acetaminophen (TYLENOL) 500 MG tablet Take 1 tablet (500 mg total) by mouth every 6 (six) hours as needed. (Patient taking differently: Take 500 mg by mouth every 6 (six) hours as needed for mild pain or fever.) 30 tablet 0  . citalopram (CELEXA) 20 MG tablet Take 1 tablet (20 mg total) by mouth daily. 30 tablet 0  . diclofenac Sodium (VOLTAREN) 1 % GEL Apply 2 g topically 4  (four) times daily. 100 g 2  . donepezil (ARICEPT) 10 MG tablet Take 0.5-1 tablets (5-10 mg total) by mouth in the morning and at bedtime. 30 tablet 2  . gabapentin (NEURONTIN) 300 MG capsule Take 300 mg by mouth 2 (two) times daily.     Marland Kitchen lisinopril-hydrochlorothiazide (ZESTORETIC) 10-12.5 MG tablet Take 1 tablet by mouth daily. 90 tablet 1  . ondansetron (ZOFRAN ODT) 8 MG disintegrating tablet Take 1 tablet (8 mg total) by mouth every 8 (eight) hours as needed for nausea or vomiting. 20 tablet 0  . pantoprazole (PROTONIX) 40 MG tablet Take 1 tablet (40 mg total) by mouth 2 (two) times daily. 60 tablet 0  . rivaroxaban (XARELTO) 20 MG TABS tablet Take 1 tablet (20 mg total) by mouth daily with supper. To start after initial loading dose pack. 30 tablet 2  . sucralfate (CARAFATE) 1 GM/10ML suspension Take 10 mLs (1 g total) by mouth 4 (four) times daily -  with meals and at bedtime. 420 mL 0  . traZODone (DESYREL) 100 MG tablet Take 200 mg by mouth at bedtime.     . dicyclomine (BENTYL) 10 MG capsule Take 1 capsule (10 mg total) by mouth 2 (two) times daily before a meal. 60 capsule 0    Allergies as of 09/23/2020 - Review Complete 09/23/2020  Allergen Reaction Noted  . Wheat extract Swelling 10/29/2017    Family History  Problem Relation Age of Onset  . High Cholesterol Mother   . Hypertension Mother   . Arthritis/Rheumatoid Mother   . Alcohol abuse Father   . Colon cancer Neg Hx   . Colon polyps Neg Hx     Social History   Socioeconomic History  . Marital status: Widowed    Spouse name: Not on file  . Number of children: 2  . Years of education: Not on file  . Highest education level: Not on file  Occupational History  . Not on file  Tobacco Use  . Smoking status: Never Smoker  . Smokeless tobacco: Never Used  Vaping Use  . Vaping Use: Never used  Substance and Sexual Activity  . Alcohol use: Yes    Alcohol/week: 1.0 standard drink    Types: 1 Glasses of wine per week     Comment: drank alcohol for past 2 weeks, one glass of wine. Prior to this would drink glass of alcohol on weekends with  dinner. 09/05/20: 3 glasses of wine a day  . Drug use: No  . Sexual activity: Not Currently    Birth control/protection: Post-menopausal  Other Topics Concern  . Not on file  Social History Narrative  . Not on file   Social Determinants of Health   Financial Resource Strain: Not on file  Food Insecurity: Not on file  Transportation Needs: Not on file  Physical Activity: Not on file  Stress: Not on file  Social Connections: Not on file  Intimate Partner Violence: Not on file    Review of Systems: Gen: Denies fever, chills, cold or flulike symptoms, presyncope, syncope. CV: Denies chest pain or heart palpitations. Resp: Denies shortness of breath or cough. GI: See HPI Psych: Admits to anxiety. Heme: See HPI  Physical Exam: Vital signs in last 24 hours: Temp:  [97.6 F (36.4 C)-99 F (37.2 C)] 99 F (37.2 C) (01/31 2242) Pulse Rate:  [63-83] 69 (02/01 0630) Resp:  [13-31] 20 (02/01 0630) BP: (149-167)/(73-97) 159/91 (02/01 0630) SpO2:  [95 %-99 %] 97 % (02/01 0630) Weight:  [86.2 kg] 86.2 kg (01/31 2233)   General:   Alert,  Well-developed, well-nourished, pleasant and cooperative in NAD Head:  Normocephalic and atraumatic. Eyes:  Sclera clear, no icterus.   Conjunctiva pink. Ears:  Normal auditory acuity. Lungs:  Clear throughout to auscultation.   No wheezes, crackles, or rhonchi. No acute distress. Heart:  Regular rate and rhythm; no murmurs, clicks, rubs, or gallops. Abdomen:  Soft and nondistended. Mild TTP in epigastric and RUQ region. No masses, hepatosplenomegaly or hernias noted. Normal bowel sounds, without guarding, and without rebound.   Rectal:  Deferred   Msk:  Symmetrical without gross deformities. Normal posture. Extremities:  Without edema. Neurologic:  Alert and  oriented x4;  grossly normal neurologically. Skin:  Intact without  significant lesions or rashes. Psych: Normal mood and affect.  Intake/Output from previous day: No intake/output data recorded. Intake/Output this shift: No intake/output data recorded.  Lab Results: Recent Labs    09/24/20 0100  WBC 6.5  HGB 11.0*  HCT 33.5*  PLT 195   BMET Recent Labs    09/24/20 0100  NA 136  K 3.7  CL 105  CO2 23  GLUCOSE 101*  BUN 14  CREATININE 0.75  CALCIUM 9.0   LFT Recent Labs    09/24/20 0100  PROT 7.1  ALBUMIN 4.2  AST 20  ALT 10  ALKPHOS 45  BILITOT 0.6    Studies/Results: CT Abdomen Pelvis W Contrast  Result Date: 09/24/2020 CLINICAL DATA:  Nausea, vomiting and diarrhea for 2 days EXAM: CT ABDOMEN AND PELVIS WITH CONTRAST TECHNIQUE: Multidetector CT imaging of the abdomen and pelvis was performed using the standard protocol following bolus administration of intravenous contrast. CONTRAST:  110mL OMNIPAQUE IOHEXOL 300 MG/ML  SOLN COMPARISON:  CT 08/07/2020 04/22/2020 FINDINGS: Lower chest: Atelectatic changes in the wrist clear lung bases. Cardiac size is top normal. No pericardial effusion. Few coronary artery calcifications. Hepatobiliary: Few subcentimeter hypoattenuating foci in the liver too small to fully characterize on CT imaging but statistically likely benign. No concerning focal liver lesions. Smooth liver surface contour. Normal liver attenuation. Gallbladder contains multiple partially calcified gallstones several of which appear partially pneumatized as well. There is some increasing pericholecystic inflammatory change when compared to prior imaging. No visible intraductal gallstones or biliary ductal dilatation is seen however. Pancreas: Redemonstration of the multilobulated cystic lesion in the head of the pancreas with few punctate calcifications again measuring  up to 3.5 by 4.0 cm in maximum transaxial dimension, not significantly changed from comparison studies. Previously characterized on prior MR imaging 04/22/2020 with a  differential including mucinous cystic neoplasm versus sequela of prior pancreatitis. Remaining portions of the pancreas are unremarkable without new or concerning pancreatic lesion, ductal dilatation, or peripancreatic inflammation. Spleen: Normal in size. No concerning splenic lesions. Adrenals/Urinary Tract: Normal adrenal glands. Kidneys are normal in size and position. Multiple simple and parapelvic cysts are noted bilaterally. A slightly more complex, partially exophytic peripherally calcified cystic lesion measuring up to 1.8 cm is seen arising from the upper pole right kidney also previously characterized on prior MR imaging is a complex though likely benign cyst. No new or concerning renal lesions. No frank hydronephrosis or obstructive urolithiasis. There is some mild faint perivesicular hazy stranding and some questionable layering debris versus asymmetric thickening of the left posterolateral bladder wall. Stomach/Bowel: Small sliding-type hiatal hernia. Proximal stomach is unremarkable. There is a circumferentially thickened appearance of the duodenal fold and gastric antrum. Mild mucosal hyperemia and possible ulceration (5/23) is present as well. More distal duodenum is unremarkable. No other small bowel thickening or dilatation. A normal appendix is visualized. No colonic dilatation or wall thickening. Scattered colonic diverticula without focal inflammation to suggest diverticulitis. Relative lack of formed stool could reflect a rapid transit state/diarrheal illness. Vascular/Lymphatic: Atherosclerotic calcification in the iliac arteries. Other significant vascular findings. No suspicious or enlarged lymph nodes in the included lymphatic chains. Reproductive: Lobular, heterogeneously enhancing lesions with uterus including both intramural and partially exophytic foci keeping with a diagnosis of uterine leiomyomata. No concerning adnexal lesions. Other: No abdominopelvic free fluid or free gas. No  bowel containing hernias. Prior vertical midline incision. Mild circumferential body wall edema most pronounced posteriorly. Musculoskeletal: Multilevel degenerative changes are present in the imaged portions of the spine. More sclerotic changes about a region of disc desiccation at the T11-12 level is similar to prior and favored to be on a degenerative basis. Grade 1 anterolisthesis L4 on 5 without associated spondylolysis. 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 Additional moderate degenerative changes in the hips and pelvis. No acute or conspicuous osseous lesions. IMPRESSION: 1. Circumferentially thickened appearance of the gastric antrum and duodenal bulb with some anemia mucosal hyperemia which could reflect some gastritis/duodenitis with a site of possible ulceration. No evidence of frank perforation or abscess. Correlate with endoscopy as clinically warranted. 2. Cholelithiasis with some slightly increasing pericholecystic inflammatory change when compared to prior imaging. If there is clinical concern for acute cholecystitis, recommend further evaluation with right upper quadrant ultrasound. 3. Relative lack of formed stool could reflect a rapid transit state/diarrheal illness. 4. Unchanged appearance of the multilobulated cystic lesion in the head of the pancreas with few punctate calcifications. Previously characterized on prior MR imaging 04/22/2020 with a differential including mucinous cystic neoplasm versus sequela of prior pancreatitis. 5. Minimally complex exophytic cystic lesion from the right kidney, previously characterized on MR imaging is a probable benign cyst. Additional fluid attenuation cysts in both kidneys. Acute urinary tract abnormality. 6. Fibroid uterus. 7. Aortic Atherosclerosis (ICD10-I70.0). Electronically Signed   By: Lovena Le M.D.   On: 09/24/2020 02:36   DG Chest Port 1 View  Result Date: 09/24/2020 CLINICAL DATA:  Shortness  of breath and cough EXAM: PORTABLE CHEST 1 VIEW COMPARISON:  05/02/2020 FINDINGS: Cardiac shadow is stable. Lungs are well aerated bilaterally. No focal infiltrate or sizable effusion is seen. No bony abnormality is noted.  IMPRESSION: No active disease. Electronically Signed   By: Inez Catalina M.D.   On: 09/24/2020 01:25   US ABDOMEN LIMITED RUQ (LIVER/GB)  Result Date: 09/24/2020 CLINICAL DATA:  Abdominal pain.  History of gallstones. EXAM: ULTRASOUND ABDOMEN LIMITED RIGHT UPPER QUADRANT COMPARISON:  Ultrasound 07/28/2020.  CT 07/28/2020. FINDINGS: Gallbladder: Gallstones again noted. Largest measures 1.6 cm. Gallbladder wall is minimally prominent at 3 mm. Trace pericholecystic fluid. Negative Murphy sign. Common bile duct: Diameter: 6 mm Liver: Increased echogenicity consistent fatty infiltration or hepatocellular disease. Portal vein is patent on color Doppler imaging with normal direction of blood flow towards the liver. Other: None. IMPRESSION: 1. Gallstones again noted. Largest measures 1.6 cm. Gallbladder wall is minimally prominent at 3 mm. Trace pericholecystic fluid. Cholecystitis cannot be excluded. Negative Murphy sign. No biliary distention. 2. Increased echogenicity of the liver consistent fatty infiltration or hepatocellular disease. Electronically Signed   By: Marcello Moores  Register   On: 09/24/2020 09:15    Impression: 65 year old female with medical history significant for PUD/duodenal ulcers with perforated prepyloric gastric ulcer (chronic NSAID use) in August 2021 s/p exploratory laparotomy, complicated by abdominal abscess and saddle PE, on Xarelto who presented to the emergency room 1/31 for worsening diarrhea, nausea, vomiting, and RUQ abdominal pain x2 days.  CT A/P with circumferentially thickened appearance of duodenal fold and gastric antrum, mild mucosal enhancement and possible ulceration with no evidence of perforation or abscess.  Also with cholelithiasis with slight increasing  pericholecystic inflammatory change.  Follow-up abdominal ultrasound with findings of cholelithiasis and mild pericholecystic fluid. WBCs normal. Hemoglobin stable at 11.0 with no over GI bleeding. BUN normal.   Suspect acute onset nausea/vomiting with worsening RUQ abdominal pain and worsening diarrhea may be secondary to acute viral gastroenteritis. May also be influenced by underlying PUD. Less likely gallbladder etiology. Notably, also had HIDA 09/18/20 with normal exam. Last EGD in November 2021 with 2 antral scars in the stomach, 2 nonbleeding cratered gastric ulcers, noncritical narrowing of angle of duodenum.  H. pylori serologies were negative.  She is due for repeat EGD in about 2 week for surveillance although this has not been scheduled. Previously with chronic NSAID use, but finally discontinued on 09/06/19.   Clinically, patient is much improved after receiving pain medications early this morning. States her abdominal pain is minimal and at baseline. No nausea or vomiting since presenting to the emergency room. Diarrhea essentially resolved. Abdominal exam with mild TTP in epigastric area and RUQ.   Do not see need for repeat EGD at this time. Suspect she has persistent PUD in the setting of chronic NSAID use. No findings on CT to suggest perforation. Hemoglobin remains stable and she has no over GI bleeding. Suspect she will be able to complete EGD outpatient. Will discuss with Dr. Laural Golden.   Diarrhea: Patient reports persistent loose stools with 3-4 BMs daily since September 2021.  Associated urgency.  No nocturnal stools.  Acute worsening of diarrhea over the last 2 days possibly secondary to gastroenteritis.  Interestingly, patient reports diarrhea also seems to worsen when she is stressed.  Previously on dicyclomine which she reports worked fairly well but ran out about 10-12 days ago. No BRBPR or melena.  Notably, patient tested positive for enteropathogenic E. coli in November 2021.  C.  difficile negative at that time.  No prior colonoscopy. No family history of IBD or colon cancer.  Patient may have underlying IBS.  Would like to rule out infectious etiology if diarrhea returns.  We  will plan to monitor for now. She will need colonoscopy outpatient.   Plan: Continue clear liquids.  Fasting serum gastric in the am per Dr. Laural Golden.  Continue PPI twice daily. Continue Carafate 3 times daily. Zofran as needed. Strict avoidance of NSAIDs. Likely outpatient EGD. She will need biopsies as no biopsies were obtained during prior EGD.  Will discuss further with Dr. Laural Golden. Xarelto is on hold. Ok to resume pending decision on EGD. (last dose morning of 1/31)  Complete C. difficile and GI pathogen panel if diarrhea returns. Needs first-ever colonoscopy outpatient.   LOS: 0 days    09/24/2020, 10:44 AM   Aliene Altes, PA-C [facility]

## 2020-09-24 NOTE — Progress Notes (Signed)
Dr Laural Golden in to check pt.

## 2020-09-24 NOTE — ED Notes (Signed)
Pt transported to CT ?

## 2020-09-24 NOTE — Consult Note (Addendum)
[facility] Consult  Reason for Consult: RUQ pain and gallstones  Referring Physician:  Dr. Manuella Ghazi  Chief Complaint    Diarrhea; Emesis      HPI: Maria Murphy is a 65 y.o. female with a history of perforated ulcer s/p graham patch 03/2020 with Dr. Arnoldo Morale who was noncompliant initially with therapies and had a continued ulcer with some degree of perforation when presenting in 06/2020. She had an EGD done at that time and she continued to have a duodenal ulcer. She says she has been on her Protonix and Carafate. No biopsies have been taken of the area.  She was on antibiotics post op and H pylori antibody was negative but she was potentially on a PPI at that both times.  She comes in with abdominal pain in the RUQ and diarrhea and vomiting.  She had a CT that demonstrated continued duodenal thickening and ulcerated area and some pericholecystic inflammation. [place] with stones but no signs of cholecystitis. She also had HIDA 1/26 that was negative for cholecystitis.   She reports no NSAID use and only taking extra strength tylenol.   Past Medical History:  Diagnosis Date  . Collagen vascular disease ([place])   . Essential hypertension, benign 01/24/2015  . Foot ulcer ([place])   . Hypertension   . Memory loss   . PUD (peptic ulcer disease)    with perforation s/p exploratory laparotomy  . Skin cancer   . Vision abnormalities     Past Surgical History:  Procedure Laterality Date  . CENTRAL LINE INSERTION Right 04/22/2020   Procedure: CENTRAL LINE INSERTION;  Surgeon: Virl Cagey, MD;  Location: [facility];  Service: General;  Laterality: Right;  . ESOPHAGOGASTRODUODENOSCOPY (EGD) WITH PROPOFOL N/A 07/10/2020   Procedure: ESOPHAGOGASTRODUODENOSCOPY (EGD) WITH PROPOFOL;  Surgeon: Rogene Houston, MD;  Location: [facility];  Service: Endoscopy;  Laterality: N/A;  . GASTRORRHAPHY  04/22/2020   Procedure: GASTRORRHAPHY;  Surgeon: Virl Cagey, MD;  Location: [facility];   Service: General;;  . IR CATHETER TUBE CHANGE  05/09/2020  . LAPAROTOMY N/A 04/22/2020   Procedure: EXPLORATORY LAPAROTOMY;  Surgeon: Virl Cagey, MD;  Location: [facility];  Service: General;  Laterality: N/A;  . TONSILLECTOMY      Family History  Problem Relation Age of Onset  . High Cholesterol Mother   . Hypertension Mother   . Arthritis/Rheumatoid Mother   . Alcohol abuse Father   . Colon cancer Neg Hx   . Colon polyps Neg Hx   . Inflammatory bowel disease Neg Hx     Social History   Tobacco Use  . Smoking status: Never Smoker  . Smokeless tobacco: Never Used  Vaping Use  . Vaping Use: Never used  Substance Use Topics  . Alcohol use: Yes    Alcohol/week: 1.0 standard drink    Types: 1 Glasses of wine per week    Comment: drank alcohol for past 2 weeks, one glass of wine. Prior to this would drink glass of alcohol on weekends with dinner. 09/05/20: 3 glasses of wine a day  . Drug use: No    Medications:  I have reviewed the patient's current medications. Prior to Admission:  Medications Prior to Admission  Medication Sig Dispense Refill Last Dose  . acetaminophen (TYLENOL) 500 MG tablet Take 1 tablet (500 mg total) by mouth every 6 (six) hours as needed. (Patient taking differently: Take 500 mg by mouth every 6 (six) hours as needed for mild pain or  fever.) 30 tablet 0 unk  . citalopram (CELEXA) 20 MG tablet Take 1 tablet (20 mg total) by mouth daily. 30 tablet 0 09/23/2020 at Unknown time  . diclofenac Sodium (VOLTAREN) 1 % GEL Apply 2 g topically 4 (four) times daily. 100 g 2 Past Week at Unknown time  . donepezil (ARICEPT) 10 MG tablet Take 0.5-1 tablets (5-10 mg total) by mouth in the morning and at bedtime. 30 tablet 2 09/23/2020 at Unknown time  . gabapentin (NEURONTIN) 300 MG capsule Take 300 mg by mouth 2 (two) times daily.    09/23/2020 at Unknown time  . lisinopril-hydrochlorothiazide (ZESTORETIC) 10-12.5 MG tablet Take 1 tablet by mouth daily. 90 tablet 1  09/23/2020 at Unknown time  . ondansetron (ZOFRAN ODT) 8 MG disintegrating tablet Take 1 tablet (8 mg total) by mouth every 8 (eight) hours as needed for nausea or vomiting. 20 tablet 0 09/23/2020 at Unknown time  . pantoprazole (PROTONIX) 40 MG tablet Take 1 tablet (40 mg total) by mouth 2 (two) times daily. 60 tablet 0 09/23/2020 at Unknown time  . rivaroxaban (XARELTO) 20 MG TABS tablet Take 1 tablet (20 mg total) by mouth daily with supper. To start after initial loading dose pack. 30 tablet 2 09/23/2020 at 1000  . sucralfate (CARAFATE) 1 GM/10ML suspension Take 10 mLs (1 g total) by mouth 4 (four) times daily -  with meals and at bedtime. 420 mL 0 09/23/2020 at Unknown time  . traZODone (DESYREL) 100 MG tablet Take 200 mg by mouth at bedtime.    09/22/2020  . dicyclomine (BENTYL) 10 MG capsule Take 1 capsule (10 mg total) by mouth 2 (two) times daily before a meal. 60 capsule 0    Scheduled: . citalopram  20 mg Oral Daily  . donepezil  5 mg Oral QHS  . gabapentin  300 mg Oral BID  . pantoprazole (PROTONIX) IV  40 mg Intravenous Q12H  . sucralfate  1 g Oral TID WC & HS  . traZODone  200 mg Oral QHS   Continuous: . lactated ringers 75 mL/hr at 09/24/20 1204   [id number] **OR** acetaminophen, ondansetron **OR** ondansetron (ZOFRAN) IV  Allergies  Allergen Reactions  . Wheat Extract Swelling     ROS:  A comprehensive review of systems was negative except for: Gastrointestinal: positive for abdominal pain, diarrhea and nausea  Blood pressure (!) 146/68, pulse (!) 56, temperature 99 F (37.2 C), temperature source Oral, resp. rate 19, height 5\' 4"  (1.626 m), weight 86.2 kg, SpO2 97 %. Physical Exam Vitals reviewed.  Constitutional:      Appearance: Normal appearance.  HENT:     Head: Normocephalic.     Nose: Nose normal.  Eyes:     Extraocular Movements: Extraocular movements intact.  Cardiovascular:     Rate and Rhythm: Normal rate.  Pulmonary:     Effort: Pulmonary  effort is normal.  Abdominal:     General: There is no distension.     Palpations: Abdomen is soft.     Tenderness: There is abdominal tenderness in the right upper quadrant and epigastric area.  Musculoskeletal:        General: No swelling.  Skin:    General: Skin is warm.  Neurological:     General: No focal deficit present.     Mental Status: She is alert and oriented to person, place, and time.  Psychiatric:        Mood and Affect: Mood normal.  Behavior: Behavior normal.        Thought Content: Thought content normal.        Judgment: Judgment normal.     Results: Results for orders placed or performed during the hospital encounter of 09/23/20 (from the past 48 hour(s))  Comprehensive metabolic panel     Status: Abnormal   Collection Time: 09/24/20  1:00 AM  Result Value Ref Range   Sodium 136 135 - 145 mmol/L   Potassium 3.7 3.5 - 5.1 mmol/L   Chloride 105 98 - 111 mmol/L   CO2 23 22 - 32 mmol/L   Glucose, Bld 101 (H) 70 - 99 mg/dL    Comment: Glucose reference range applies only to samples taken after fasting for at least 8 hours.   BUN 14 8 - 23 mg/dL   Creatinine, Ser 0.75 0.44 - 1.00 mg/dL   Calcium 9.0 8.9 - 10.3 mg/dL   Total Protein 7.1 6.5 - 8.1 g/dL   Albumin 4.2 3.5 - 5.0 g/dL   AST 20 15 - 41 U/L   ALT 10 0 - 44 U/L   Alkaline Phosphatase 45 38 - 126 U/L   Total Bilirubin 0.6 0.3 - 1.2 mg/dL   GFR, Estimated >60 >60 mL/min    Comment: (NOTE) Calculated using the CKD-EPI Creatinine Equation (2021)    Anion gap 8 5 - 15    Comment: Performed at [facility], [address] [place], [place] [address]  Lipase, blood     Status: None   Collection Time: 09/24/20  1:00 AM  Result Value Ref Range   Lipase 33 11 - 51 U/L    Comment: Performed at [facility], [address] [place], [place] [address]  CBC with Differential     Status: Abnormal   Collection Time: 09/24/20  1:00 AM  Result Value Ref Range   WBC 6.5 4.0 - 10.5 K/uL   RBC 3.92 3.87 -  5.11 MIL/uL   Hemoglobin 11.0 (L) 12.0 - 15.0 g/dL   HCT 33.5 (L) 36.0 - 46.0 %   MCV 85.5 80.0 - 100.0 fL   MCH 28.1 26.0 - 34.0 pg   MCHC 32.8 30.0 - 36.0 g/dL   RDW 15.3 11.5 - 15.5 %   Platelets 195 150 - 400 K/uL   nRBC 0.0 0.0 - 0.2 %   Neutrophils Relative % 76 %   Neutro Abs 5.0 1.7 - 7.7 K/uL   Lymphocytes Relative 16 %   Lymphs Abs 1.1 0.7 - 4.0 K/uL   Monocytes Relative 6 %   Monocytes Absolute 0.4 0.1 - 1.0 K/uL   Eosinophils Relative 1 %   Eosinophils Absolute 0.0 0.0 - 0.5 K/uL   Basophils Relative 0 %   Basophils Absolute 0.0 0.0 - 0.1 K/uL   Immature Granulocytes 1 %   Abs Immature Granulocytes 0.03 0.00 - 0.07 K/uL    Comment: Performed at [facility], [address] [place],  [address]  D-dimer, quantitative     Status: None   Collection Time: 09/24/20  1:00 AM  Result Value Ref Range   D-Dimer, Quant 0.45 0.00 - 0.50 ug/mL-FEU    Comment: (NOTE) At the manufacturer cut-off value of 0.5 g/mL FEU, this assay has a negative predictive value of 95-100%.This assay is intended for use in conjunction with a clinical pretest probability (PTP) assessment model to exclude pulmonary embolism (PE) and deep venous thrombosis (DVT) in outpatients suspected of PE or DVT. Results should be correlated with clinical  presentation. Performed at [facility], [address], [place], [place] [address]   Urinalysis, Routine w reflex microscopic Urine, Clean Catch     Status: Abnormal   Collection Time: 09/24/20  4:23 AM  Result Value Ref Range   Color, Urine YELLOW YELLOW   APPearance CLEAR CLEAR   Specific Gravity, Urine 1.044 (H) 1.005 - 1.030   pH 6.0 5.0 - 8.0   Glucose, UA NEGATIVE NEGATIVE mg/dL   Hgb urine dipstick NEGATIVE NEGATIVE   Bilirubin Urine NEGATIVE NEGATIVE   Ketones, ur 20 (A) NEGATIVE mg/dL   Protein, ur NEGATIVE NEGATIVE mg/dL   Nitrite NEGATIVE NEGATIVE   Leukocytes,Ua TRACE (A) NEGATIVE   RBC / HPF 0-5 0 - 5 RBC/hpf   WBC, UA 0-5 0 - 5  WBC/hpf   Bacteria, UA NONE SEEN NONE SEEN   Squamous Epithelial / LPF 0-5 0 - 5   Mucus PRESENT     Comment: Performed at [facility], [address], [place], [place] [address]  SARS Coronavirus 2 by RT PCR (hospital order, performed in [place] hospital lab) Nasopharyngeal Nasopharyngeal Swab     Status: None   Collection Time: 09/24/20  7:22 AM   Specimen: Nasopharyngeal Swab  Result Value Ref Range   SARS Coronavirus 2 NEGATIVE NEGATIVE    Comment: (NOTE) SARS-CoV-2 target nucleic acids are NOT DETECTED.  The SARS-CoV-2 RNA is generally detectable in upper and lower respiratory specimens during the acute phase of infection. The lowest concentration of SARS-CoV-2 viral copies this assay can detect is 250 copies / mL. A negative result does not preclude SARS-CoV-2 infection and should not be used as the sole basis for treatment or other patient management decisions.  A negative result may occur with improper specimen collection / handling, submission of specimen other than nasopharyngeal swab, presence of viral mutation(s) within the areas targeted by this assay, and inadequate number of viral copies (<250 copies / mL). A negative result must be combined with clinical observations, patient history, and epidemiological information.  Fact Sheet for Patients:   StrictlyIdeas.no  Fact Sheet for Healthcare Providers: BankingDealers.co.za  This test is not yet approved or  cleared by the [place] FDA and has been authorized for detection and/or diagnosis of SARS-CoV-2 by FDA under an Emergency Use Authorization (EUA).  This EUA will remain in effect (meaning this test can be used) for the duration of the COVID-19 declaration under Section 564(b)(1) of the Act, 21 U.S.C. section 360bbb-3(b)(1), unless the authorization is terminated or revoked sooner.  Performed at [facility], [address], [place], [place] [address]     Personally reviewed- CT with gallstone and thickened duodenum, gallbladder wall hard to define on CT  CT Abdomen Pelvis W Contrast  Result Date: 09/24/2020 CLINICAL DATA:  Nausea, vomiting and diarrhea for 2 days EXAM: CT ABDOMEN AND PELVIS WITH CONTRAST TECHNIQUE: Multidetector CT imaging of the abdomen and pelvis was performed using the standard protocol following bolus administration of intravenous contrast. CONTRAST:  13mL OMNIPAQUE IOHEXOL 300 MG/ML  SOLN COMPARISON:  CT 08/07/2020 04/22/2020 FINDINGS: Lower chest: Atelectatic changes in the wrist clear lung bases. Cardiac size is top normal. No pericardial effusion. Few coronary artery calcifications. Hepatobiliary: Few subcentimeter hypoattenuating foci in the liver too small to fully characterize on CT imaging but statistically likely benign. No concerning focal liver lesions. Smooth liver surface contour. Normal liver attenuation. Gallbladder contains multiple partially calcified gallstones several of which appear partially pneumatized as well. There is some increasing pericholecystic  inflammatory change when compared to prior imaging. No visible intraductal gallstones or biliary ductal dilatation is seen however. Pancreas: Redemonstration of the multilobulated cystic lesion in the head of the pancreas with few punctate calcifications again measuring up to 3.5 by 4.0 cm in maximum transaxial dimension, not significantly changed from comparison studies. Previously characterized on prior MR imaging 04/22/2020 with a differential including mucinous cystic neoplasm versus sequela of prior pancreatitis. Remaining portions of the pancreas are unremarkable without new or concerning pancreatic lesion, ductal dilatation, or peripancreatic inflammation. Spleen: Normal in size. No concerning splenic lesions. Adrenals/Urinary Tract: Normal adrenal glands. Kidneys are normal in size and position. Multiple simple and parapelvic cysts are noted bilaterally. A  slightly more complex, partially exophytic peripherally calcified cystic lesion measuring up to 1.8 cm is seen arising from the upper pole right kidney also previously characterized on prior MR imaging is a complex though likely benign cyst. No new or concerning renal lesions. No frank hydronephrosis or obstructive urolithiasis. There is some mild faint perivesicular hazy stranding and some questionable layering debris versus asymmetric thickening of the left posterolateral bladder wall. Stomach/Bowel: Small sliding-type hiatal hernia. Proximal stomach is unremarkable. There is a circumferentially thickened appearance of the duodenal fold and gastric antrum. Mild mucosal hyperemia and possible ulceration (5/23) is present as well. More distal duodenum is unremarkable. No other small bowel thickening or dilatation. A normal appendix is visualized. No colonic dilatation or wall thickening. Scattered colonic diverticula without focal inflammation to suggest diverticulitis. Relative lack of formed stool could reflect a rapid transit state/diarrheal illness. Vascular/Lymphatic: Atherosclerotic calcification in the iliac arteries. Other significant vascular findings. No suspicious or enlarged lymph nodes in the included lymphatic chains. Reproductive: Lobular, heterogeneously enhancing lesions with uterus including both intramural and partially exophytic foci keeping with a diagnosis of uterine leiomyomata. No concerning adnexal lesions. Other: No abdominopelvic free fluid or free gas. No bowel containing hernias. Prior vertical midline incision. Mild circumferential body wall edema most pronounced posteriorly. Musculoskeletal: Multilevel degenerative changes are present in the imaged portions of the spine. More sclerotic changes about a region of disc desiccation at the T11-12 level is similar to prior and favored to be on a degenerative basis. Grade 1 anterolisthesis L4 on 5 without associated spondylolysis. 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 Additional moderate degenerative changes in the hips and pelvis. No acute or conspicuous osseous lesions. IMPRESSION: 1. Circumferentially thickened appearance of the gastric antrum and duodenal bulb with some anemia mucosal hyperemia which could reflect some gastritis/duodenitis with a site of possible ulceration. No evidence of frank perforation or abscess. Correlate with endoscopy as clinically warranted. 2. Cholelithiasis with some slightly increasing pericholecystic inflammatory change when compared to prior imaging. If there is clinical concern for acute cholecystitis, recommend further evaluation with right upper quadrant ultrasound. 3. Relative lack of formed stool could reflect a rapid transit state/diarrheal illness. 4. Unchanged appearance of the multilobulated cystic lesion in the head of the pancreas with few punctate calcifications. Previously characterized on prior MR imaging 04/22/2020 with a differential including mucinous cystic neoplasm versus sequela of prior pancreatitis. 5. Minimally complex exophytic cystic lesion from the right kidney, previously characterized on MR imaging is a probable benign cyst. Additional fluid attenuation cysts in both kidneys. Acute urinary tract abnormality. 6. Fibroid uterus. 7. Aortic Atherosclerosis (ICD10-I70.0). Electronically Signed   By: Lovena Le M.D.   On: 09/24/2020 02:36   DG Chest Port 1 View  Result Date: 09/24/2020 CLINICAL DATA:  Shortness of breath and cough EXAM: PORTABLE CHEST 1 VIEW COMPARISON:  05/02/2020 FINDINGS: Cardiac shadow is stable. Lungs are well aerated bilaterally. No focal infiltrate or sizable effusion is seen. No bony abnormality is noted. IMPRESSION: No active disease. Electronically Signed   By: Inez Catalina M.D.   On: 09/24/2020 01:25   US ABDOMEN LIMITED RUQ (LIVER/GB)  Result Date: 09/24/2020 CLINICAL DATA:  Abdominal pain.  History of  gallstones. EXAM: ULTRASOUND ABDOMEN LIMITED RIGHT UPPER QUADRANT COMPARISON:  Ultrasound 07/28/2020.  CT 07/28/2020. FINDINGS: Gallbladder: Gallstones again noted. Largest measures 1.6 cm. Gallbladder wall is minimally prominent at 3 mm. Trace pericholecystic fluid. Negative Murphy sign. Common bile duct: Diameter: 6 mm Liver: Increased echogenicity consistent fatty infiltration or hepatocellular disease. Portal vein is patent on color Doppler imaging with normal direction of blood flow towards the liver. Other: None. IMPRESSION: 1. Gallstones again noted. Largest measures 1.6 cm. Gallbladder wall is minimally prominent at 3 mm. Trace pericholecystic fluid. Cholecystitis cannot be excluded. Negative Murphy sign. No biliary distention. 2. Increased echogenicity of the liver consistent fatty infiltration or hepatocellular disease. Electronically Signed   By: Marcello Moores  Register   On: 09/24/2020 09:15     Assessment & Plan:  Mulani Debro is a 65 y.o. female with continue signs of ulcer and pain in the RUQ most likely consistent with there ulcer. She now has likely a more refractory ulcer and has not had biopsies of this area. H pylori serology was done but she has been on and off PPI.  She has stones but her gallbladder does not show signs of cholecystitis on [place] or recent HIDA. I think it is second inflammation from her duodenal pathology. Any attempts at a cholecystectomy will have a high likelihood of an open procedure and risk of common bile duct injury or subtotal cholecystectomy given the prior inflammation and scarring.   GI for repeat EGD and biopsy May need work up for hypersecretory state if continues   All questions were answered to the satisfaction of the patient and family.  Discussed with Dr. Manuella Ghazi and GI.    Virl Cagey 09/24/2020, 2:02 PM

## 2020-09-24 NOTE — H&P (Signed)
History and Physical    [name] ION:[id number] DOB: 03/24/1956 DOA: 09/23/2020  PCP: Marcine Matar, MD   Patient coming from: Home  Chief Complaint: N/V/D  HPI: Maria Murphy is a 65 y.o. 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  She normally has chronic diarrhea with 3-4 episodes of bowel movements a day, but on Sunday noticed approximately 20 episodes of watery diarrhea as well as some nausea and vomiting that was quite significant.  She describes a new right upper quadrant abdominal pain that is cramping in nature with no radiation.  She states that eating seems to make the pain and symptoms worse.  She denies any recent change in her diet or sick contacts.  She has completed Covid vaccination, but has not received a booster.  She denies any cough, shortness of breath, chest pain, or congestion.  She has been taking her home medications to include Xarelto as prescribed for prior PEs. She has had a recent HIDA scan on 1/26 that was negative for any acute findings.   ED Course: Vital signs stable patient is afebrile.  Laboratory data remarkable for hemoglobin of 11, CT abdomen with findings of gastritis and duodenitis as well as potential cholelithiasis.  Follow-up abdominal ultrasound with findings of cholelithiasis and mild pericholecystic fluid, but otherwise insignificant.  Chest x-ray with no acute findings.  Covid testing pending.  No leukocytosis or LFT elevation noted.    Review of Systems: Reviewed as noted above, otherwise negative.  Past Medical History:  Diagnosis Date  . Collagen vascular disease (HCC)   . Essential hypertension, benign 01/24/2015  . Foot ulcer (HCC)   . Hypertension   . Memory loss   . PUD (peptic ulcer disease)    with perforation s/p exploratory laparotomy  .  Skin cancer   . Vision abnormalities     Past Surgical History:  Procedure Laterality Date  . CENTRAL LINE INSERTION Right 04/22/2020   Procedure: CENTRAL LINE INSERTION;  Surgeon: Lucretia Roers, MD;  Location: [facility];  Service: General;  Laterality: Right;  . ESOPHAGOGASTRODUODENOSCOPY (EGD) WITH PROPOFOL N/A 07/10/2020   Procedure: ESOPHAGOGASTRODUODENOSCOPY (EGD) WITH PROPOFOL;  Surgeon: Malissa Hippo, MD;  Location: [facility];  Service: Endoscopy;  Laterality: N/A;  . GASTRORRHAPHY  04/22/2020   Procedure: GASTRORRHAPHY;  Surgeon: Lucretia Roers, MD;  Location: [facility];  Service: General;;  . IR CATHETER TUBE CHANGE  05/09/2020  . LAPAROTOMY N/A 04/22/2020   Procedure: EXPLORATORY LAPAROTOMY;  Surgeon: Lucretia Roers, MD;  Location: [facility];  Service: General;  Laterality: N/A;  . TONSILLECTOMY       reports that she has never smoked. She has never used smokeless tobacco. She reports current alcohol use of about 1.0 standard drink of alcohol per week. She reports that she does not use drugs.  Allergies  Allergen Reactions  . Wheat Extract Swelling    Family History  Problem Relation Age of Onset  . High Cholesterol Mother   . Hypertension Mother   . Arthritis/Rheumatoid Mother   . Alcohol abuse Father   . Colon cancer Neg Hx   . Colon polyps Neg Hx     Prior to Admission medications   Medication Sig Start Date End Date Taking? Authorizing Provider  acetaminophen (TYLENOL) 500 MG tablet Take 1 tablet (500 mg total) by  mouth every 6 (six) hours as needed. Patient taking differently: Take 500 mg by mouth every 6 (six) hours as needed for mild pain or fever. 08/10/18  Yes Idol, Raynelle Fanning, PA-C  citalopram (CELEXA) 20 MG tablet Take 1 tablet (20 mg total) by mouth daily. 08/16/19  Yes Wallis Bamberg, PA-C  diclofenac Sodium (VOLTAREN) 1 % GEL Apply 2 g topically 4 (four) times daily. 09/20/20  Yes Marcine Matar, MD  donepezil (ARICEPT) 10 MG tablet Take 0.5-1  tablets (5-10 mg total) by mouth in the morning and at bedtime. 09/20/20  Yes Marcine Matar, MD  gabapentin (NEURONTIN) 300 MG capsule Take 300 mg by mouth 2 (two) times daily.  08/23/19  Yes [provider]  lisinopril-hydrochlorothiazide (ZESTORETIC) 10-12.5 MG tablet Take 1 tablet by mouth daily. 09/20/20  Yes Marcine Matar, MD  ondansetron (ZOFRAN ODT) 8 MG disintegrating tablet Take 1 tablet (8 mg total) by mouth every 8 (eight) hours as needed for nausea or vomiting. 09/20/20  Yes Marcine Matar, MD  pantoprazole (PROTONIX) 40 MG tablet Take 1 tablet (40 mg total) by mouth 2 (two) times daily. 09/20/20 10/20/20 Yes Marcine Matar, MD  rivaroxaban (XARELTO) 20 MG TABS tablet Take 1 tablet (20 mg total) by mouth daily with supper. To start after initial loading dose pack. 09/20/20  Yes Marcine Matar, MD  sucralfate (CARAFATE) 1 GM/10ML suspension Take 10 mLs (1 g total) by mouth 4 (four) times daily -  with meals and at bedtime. 09/20/20  Yes Marcine Matar, MD  traZODone (DESYREL) 100 MG tablet Take 200 mg by mouth at bedtime.  08/02/19  Yes [provider]  dicyclomine (BENTYL) 10 MG capsule Take 1 capsule (10 mg total) by mouth 2 (two) times daily before a meal. 07/12/20 08/11/20  Maurilio Lovely D, DO    Physical Exam: Vitals:   09/24/20 0338 09/24/20 0440 09/24/20 0530 09/24/20 0630  BP: (!) 156/91 (!) 167/97 (!) 153/88 (!) 159/91  Pulse: 77 65 66 69  Resp: 13 14 13 20   Temp:      TempSrc:      SpO2: 97% 97% 95% 97%  Weight:      Height:        Constitutional: NAD, calm, comfortable, obese Vitals:   09/24/20 0338 09/24/20 0440 09/24/20 0530 09/24/20 0630  BP: (!) 156/91 (!) 167/97 (!) 153/88 (!) 159/91  Pulse: 77 65 66 69  Resp: 13 14 13 20   Temp:      TempSrc:      SpO2: 97% 97% 95% 97%  Weight:      Height:       Eyes: lids and conjunctivae normal Neck: normal, supple Respiratory: clear to auscultation bilaterally. Normal respiratory  effort. No accessory muscle use.  Cardiovascular: Regular rate and rhythm, no murmurs. Abdomen: Tenderness to palpation of the right upper quadrant, no distention. Bowel sounds positive.  Musculoskeletal:  No edema. Skin: no rashes, lesions, ulcers.  Psychiatric: Flat affect  Labs on Admission: I have personally reviewed following labs and imaging studies  CBC: Recent Labs  Lab 09/24/20 0100  WBC 6.5  NEUTROABS 5.0  HGB 11.0*  HCT 33.5*  MCV 85.5  PLT 195   Basic Metabolic Panel: Recent Labs  Lab 09/24/20 0100  NA 136  K 3.7  CL 105  CO2 23  GLUCOSE 101*  BUN 14  CREATININE 0.75  CALCIUM 9.0   GFR: Estimated Creatinine Clearance: 75.5 mL/min (by C-G formula based on SCr  of 0.75 mg/dL). Liver Function Tests: Recent Labs  Lab 09/24/20 0100  AST 20  ALT 10  ALKPHOS 45  BILITOT 0.6  PROT 7.1  ALBUMIN 4.2   Recent Labs  Lab 09/24/20 0100  LIPASE 33   No results for input(s): AMMONIA in the last 168 hours. Coagulation Profile: No results for input(s): INR, PROTIME in the last 168 hours. Cardiac Enzymes: No results for input(s): CKTOTAL, CKMB, CKMBINDEX, TROPONINI in the last 168 hours. BNP (last 3 results) No results for input(s): PROBNP in the last 8760 hours. HbA1C: No results for input(s): HGBA1C in the last 72 hours. CBG: No results for input(s): GLUCAP in the last 168 hours. Lipid Profile: No results for input(s): CHOL, HDL, LDLCALC, TRIG, CHOLHDL, LDLDIRECT in the last 72 hours. Thyroid Function Tests: No results for input(s): TSH, T4TOTAL, FREET4, T3FREE, THYROIDAB in the last 72 hours. Anemia Panel: No results for input(s): VITAMINB12, FOLATE, FERRITIN, TIBC, IRON, RETICCTPCT in the last 72 hours. Urine analysis:    Component Value Date/Time   COLORURINE YELLOW 09/24/2020 0423   APPEARANCEUR CLEAR 09/24/2020 0423   LABSPEC 1.044 (H) 09/24/2020 0423   PHURINE 6.0 09/24/2020 0423   GLUCOSEU NEGATIVE 09/24/2020 0423   HGBUR NEGATIVE  09/24/2020 0423   BILIRUBINUR NEGATIVE 09/24/2020 0423   KETONESUR 20 (A) 09/24/2020 0423   PROTEINUR NEGATIVE 09/24/2020 0423   NITRITE NEGATIVE 09/24/2020 0423   LEUKOCYTESUR TRACE (A) 09/24/2020 0423    Radiological Exams on Admission: CT Abdomen Pelvis W Contrast  Result Date: 09/24/2020 CLINICAL DATA:  Nausea, vomiting and diarrhea for 2 days EXAM: CT ABDOMEN AND PELVIS WITH CONTRAST TECHNIQUE: Multidetector CT imaging of the abdomen and pelvis was performed using the standard protocol following bolus administration of intravenous contrast. CONTRAST:  OMNIPAQUE IOHEXOL 300 MG/ML  SOLN COMPARISON:  CT 08/07/2020 04/22/2020 FINDINGS: Lower chest: Atelectatic changes in the wrist clear lung bases. Cardiac size is top normal. No pericardial effusion. Few coronary artery calcifications. Hepatobiliary: Few subcentimeter hypoattenuating foci in the liver too small to fully characterize on CT imaging but statistically likely benign. No concerning focal liver lesions. Smooth liver surface contour. Normal liver attenuation. Gallbladder contains multiple partially calcified gallstones several of which appear partially pneumatized as well. There is some increasing pericholecystic inflammatory change when compared to prior imaging. No visible intraductal gallstones or biliary ductal dilatation is seen however. Pancreas: Redemonstration of the multilobulated cystic lesion in the head of the pancreas with few punctate calcifications again measuring up to 3.5 by 4.0 cm in maximum transaxial dimension, not significantly changed from comparison studies. Previously characterized on prior MR imaging 04/22/2020 with a differential including mucinous cystic neoplasm versus sequela of prior pancreatitis. Remaining portions of the pancreas are unremarkable without new or concerning pancreatic lesion, ductal dilatation, or peripancreatic inflammation. Spleen: Normal in size. No concerning splenic lesions.  Adrenals/Urinary Tract: Normal adrenal glands. Kidneys are normal in size and position. Multiple simple and parapelvic cysts are noted bilaterally. A slightly more complex, partially exophytic peripherally calcified cystic lesion measuring up to 1.8 cm is seen arising from the upper pole right kidney also previously characterized on prior MR imaging is a complex though likely benign cyst. No new or concerning renal lesions. No frank hydronephrosis or obstructive urolithiasis. There is some mild faint perivesicular hazy stranding and some questionable layering debris versus asymmetric thickening of the left posterolateral bladder wall. Stomach/Bowel: Small sliding-type hiatal hernia. Proximal stomach is unremarkable. There is a circumferentially thickened appearance of the duodenal fold and gastric  antrum. Mild mucosal hyperemia and possible ulceration (5/23) is present as well. More distal duodenum is unremarkable. No other small bowel thickening or dilatation. A normal appendix is visualized. No colonic dilatation or wall thickening. Scattered colonic diverticula without focal inflammation to suggest diverticulitis. Relative lack of formed stool could reflect a rapid transit state/diarrheal illness. Vascular/Lymphatic: Atherosclerotic calcification in the iliac arteries. Other significant vascular findings. No suspicious or enlarged lymph nodes in the included lymphatic chains. Reproductive: Lobular, heterogeneously enhancing lesions with uterus including both intramural and partially exophytic foci keeping with a diagnosis of uterine leiomyomata. No concerning adnexal lesions. Other: No abdominopelvic free fluid or free gas. No bowel containing hernias. Prior vertical midline incision. Mild circumferential body wall edema most pronounced posteriorly. Musculoskeletal: Multilevel degenerative changes are present in the imaged portions of the spine. More sclerotic changes about a region of disc desiccation at the  T11-12 level is similar to prior and favored to be on a degenerative basis. Grade 1 anterolisthesis L4 on 5 without associated spondylolysis. 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 Additional moderate degenerative changes in the hips and pelvis. No acute or conspicuous osseous lesions. IMPRESSION: 1. Circumferentially thickened appearance of the gastric antrum and duodenal bulb with some anemia mucosal hyperemia which could reflect some gastritis/duodenitis with a site of possible ulceration. No evidence of frank perforation or abscess. Correlate with endoscopy as clinically warranted. 2. Cholelithiasis with some slightly increasing pericholecystic inflammatory change when compared to prior imaging. If there is clinical concern for acute cholecystitis, recommend further evaluation with right upper quadrant ultrasound. 3. Relative lack of formed stool could reflect a rapid transit state/diarrheal illness. 4. Unchanged appearance of the multilobulated cystic lesion in the head of the pancreas with few punctate calcifications. Previously characterized on prior MR imaging 04/22/2020 with a differential including mucinous cystic neoplasm versus sequela of prior pancreatitis. 5. Minimally complex exophytic cystic lesion from the right kidney, previously characterized on MR imaging is a probable benign cyst. Additional fluid attenuation cysts in both kidneys. Acute urinary tract abnormality. 6. Fibroid uterus. 7. Aortic Atherosclerosis (ICD10-I70.0). Electronically Signed   By: Kreg Shropshire M.D.   On: 09/24/2020 02:36   DG Chest Port 1 View  Result Date: 09/24/2020 CLINICAL DATA:  Shortness of breath and cough EXAM: PORTABLE CHEST 1 VIEW COMPARISON:  05/02/2020 FINDINGS: Cardiac shadow is stable. Lungs are well aerated bilaterally. No focal infiltrate or sizable effusion is seen. No bony abnormality is noted. IMPRESSION: No active disease. Electronically Signed   By: Alcide Clever M.D.   On: 09/24/2020 01:25   US ABDOMEN LIMITED RUQ (LIVER/GB)  Result Date: 09/24/2020 CLINICAL DATA:  Abdominal pain.  History of gallstones. EXAM: ULTRASOUND ABDOMEN LIMITED RIGHT UPPER QUADRANT COMPARISON:  Ultrasound 07/28/2020.  CT 07/28/2020. FINDINGS: Gallbladder: Gallstones again noted. Largest measures 1.6 cm. Gallbladder wall is minimally prominent at 3 mm. Trace pericholecystic fluid. Negative Murphy sign. Common bile duct: Diameter: 6 mm Liver: Increased echogenicity consistent fatty infiltration or hepatocellular disease. Portal vein is patent on color Doppler imaging with normal direction of blood flow towards the liver. Other: None. IMPRESSION: 1. Gallstones again noted. Largest measures 1.6 cm. Gallbladder wall is minimally prominent at 3 mm. Trace pericholecystic fluid. Cholecystitis cannot be excluded. Negative Murphy sign. No biliary distention. 2. Increased echogenicity of the liver consistent fatty infiltration or hepatocellular disease. Electronically Signed   By: Maisie Fus  Register   On: 09/24/2020 09:15    Assessment/Plan Active Problems:   Acute gastroenteritis  Intractable nausea and vomiting with diarrhea -Suspect relation to acute viral gastroenteritis versus flareup of peptic ulcer disease -Plan to start on clear liquids -Zofran as needed for nausea or vomiting -IV fluid for hydration -Gallbladder ultrasound performed with no significant findings, discussed with general surgery with no need for operative intervention at this time-recommending GI evaluation treatment of ulcer disease -PPI twice daily and Carafate with meals -Appreciate further GI evaluation -Covid testing pending  History of duodenal ulcers with stenosis/PUD -Appreciate GI evaluation, otherwise as noted above  Hypertension -Labetalol to be ordered as needed, currently controlled -Hold home medications for now  History of PE -Holding Xarelto for now until nausea and vomiting resolved  and no further procedures anticipated  Obesity -BMI 32.61 -Lifestyle changes outpatient  GERD -PPI twice daily and Carafate as noted above   DVT prophylaxis: SCDs, holding home Xarelto for now Code Status: Full Family Communication: Discussed with sister on phone 2/1 Disposition Plan:Admit for management of acute gastroenteritis/PUD Consults called:GI, General Surgery Admission status: Obs, MedSurg   Marriah Sanderlin D Sherryll Burger DO [facility]  If 7PM-7AM, please contact night-coverage [email or web address]  09/24/2020, 10:07 AM

## 2020-09-24 NOTE — ED Notes (Signed)
Pt returned from CT °
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
## 2020-09-25 ENCOUNTER — Ambulatory Visit: Payer: Medicaid Other | Admitting: Critical Care Medicine

## 2020-09-25 ENCOUNTER — Encounter (HOSPITAL_COMMUNITY): Payer: Self-pay | Admitting: Internal Medicine

## 2020-09-25 DIAGNOSIS — K529 Noninfective gastroenteritis and colitis, unspecified: Secondary | ICD-10-CM | POA: Diagnosis present

## 2020-09-25 DIAGNOSIS — Z20822 Contact with and (suspected) exposure to covid-19: Secondary | ICD-10-CM | POA: Diagnosis present

## 2020-09-25 DIAGNOSIS — K8010 Calculus of gallbladder with chronic cholecystitis without obstruction: Secondary | ICD-10-CM | POA: Diagnosis present

## 2020-09-25 DIAGNOSIS — Z791 Long term (current) use of non-steroidal anti-inflammatories (NSAID): Secondary | ICD-10-CM | POA: Diagnosis not present

## 2020-09-25 DIAGNOSIS — Z9119 Patient's noncompliance with other medical treatment and regimen: Secondary | ICD-10-CM | POA: Diagnosis not present

## 2020-09-25 DIAGNOSIS — R112 Nausea with vomiting, unspecified: Secondary | ICD-10-CM | POA: Diagnosis present

## 2020-09-25 DIAGNOSIS — R197 Diarrhea, unspecified: Secondary | ICD-10-CM | POA: Diagnosis not present

## 2020-09-25 DIAGNOSIS — Z8249 Family history of ischemic heart disease and other diseases of the circulatory system: Secondary | ICD-10-CM | POA: Diagnosis not present

## 2020-09-25 DIAGNOSIS — K2970 Gastritis, unspecified, without bleeding: Secondary | ICD-10-CM | POA: Diagnosis present

## 2020-09-25 DIAGNOSIS — E669 Obesity, unspecified: Secondary | ICD-10-CM | POA: Diagnosis present

## 2020-09-25 DIAGNOSIS — Z7901 Long term (current) use of anticoagulants: Secondary | ICD-10-CM | POA: Diagnosis not present

## 2020-09-25 DIAGNOSIS — K219 Gastro-esophageal reflux disease without esophagitis: Secondary | ICD-10-CM | POA: Diagnosis present

## 2020-09-25 DIAGNOSIS — I10 Essential (primary) hypertension: Secondary | ICD-10-CM | POA: Diagnosis present

## 2020-09-25 DIAGNOSIS — Z8711 Personal history of peptic ulcer disease: Secondary | ICD-10-CM | POA: Diagnosis not present

## 2020-09-25 DIAGNOSIS — Z91018 Allergy to other foods: Secondary | ICD-10-CM | POA: Diagnosis not present

## 2020-09-25 DIAGNOSIS — D6859 Other primary thrombophilia: Secondary | ICD-10-CM | POA: Diagnosis present

## 2020-09-25 DIAGNOSIS — E860 Dehydration: Secondary | ICD-10-CM

## 2020-09-25 DIAGNOSIS — Z6832 Body mass index (BMI) 32.0-32.9, adult: Secondary | ICD-10-CM | POA: Diagnosis not present

## 2020-09-25 DIAGNOSIS — D649 Anemia, unspecified: Secondary | ICD-10-CM | POA: Diagnosis present

## 2020-09-25 DIAGNOSIS — K279 Peptic ulcer, site unspecified, unspecified as acute or chronic, without hemorrhage or perforation: Secondary | ICD-10-CM

## 2020-09-25 DIAGNOSIS — Z79899 Other long term (current) drug therapy: Secondary | ICD-10-CM | POA: Diagnosis not present

## 2020-09-25 DIAGNOSIS — K2980 Duodenitis without bleeding: Secondary | ICD-10-CM | POA: Diagnosis present

## 2020-09-25 DIAGNOSIS — Z86711 Personal history of pulmonary embolism: Secondary | ICD-10-CM | POA: Diagnosis not present

## 2020-09-25 DIAGNOSIS — E876 Hypokalemia: Secondary | ICD-10-CM | POA: Diagnosis present

## 2020-09-25 DIAGNOSIS — Z85828 Personal history of other malignant neoplasm of skin: Secondary | ICD-10-CM | POA: Diagnosis not present

## 2020-09-25 LAB — CBC
HCT: 30.6 % — ABNORMAL LOW (ref 36.0–46.0)
Hemoglobin: 9.7 g/dL — ABNORMAL LOW (ref 12.0–15.0)
MCH: 27.5 pg (ref 26.0–34.0)
MCHC: 31.7 g/dL (ref 30.0–36.0)
MCV: 86.7 fL (ref 80.0–100.0)
Platelets: 153 10*3/uL (ref 150–400)
RBC: 3.53 MIL/uL — ABNORMAL LOW (ref 3.87–5.11)
RDW: 15.2 % (ref 11.5–15.5)
WBC: 3.5 10*3/uL — ABNORMAL LOW (ref 4.0–10.5)
nRBC: 0 % (ref 0.0–0.2)

## 2020-09-25 LAB — COMPREHENSIVE METABOLIC PANEL
ALT: 7 U/L (ref 0–44)
AST: 13 U/L — ABNORMAL LOW (ref 15–41)
Albumin: 3.1 g/dL — ABNORMAL LOW (ref 3.5–5.0)
Alkaline Phosphatase: 37 U/L — ABNORMAL LOW (ref 38–126)
Anion gap: 5 (ref 5–15)
BUN: 8 mg/dL (ref 8–23)
CO2: 24 mmol/L (ref 22–32)
Calcium: 8.7 mg/dL — ABNORMAL LOW (ref 8.9–10.3)
Chloride: 110 mmol/L (ref 98–111)
Creatinine, Ser: 0.66 mg/dL (ref 0.44–1.00)
GFR, Estimated: >60 mL/min (ref >60)
Glucose, Bld: 92 mg/dL (ref 70–99)
Potassium: 3.4 mmol/L — ABNORMAL LOW (ref 3.5–5.1)
Sodium: 139 mmol/L (ref 135–145)
Total Bilirubin: 0.4 mg/dL (ref 0.3–1.2)
Total Protein: 5.7 g/dL — ABNORMAL LOW (ref 6.5–8.1)

## 2020-09-25 LAB — MAGNESIUM: Magnesium: 1.7 mg/dL (ref 1.7–2.4)

## 2020-09-25 MED ORDER — POTASSIUM CHLORIDE CRYS ER 20 MEQ PO TBCR
30.0000 meq | EXTENDED_RELEASE_TABLET | Freq: Once | ORAL | Status: AC
  Administered 2020-09-25: 30 meq via ORAL
  Filled 2020-09-25: qty 1

## 2020-09-25 MED ORDER — LORAZEPAM 0.5 MG PO TABS
0.5000 mg | ORAL_TABLET | ORAL | Status: DC | PRN
  Filled 2020-09-25: qty 1

## 2020-09-25 MED ORDER — METOCLOPRAMIDE HCL 5 MG/ML IJ SOLN
5.0000 mg | Freq: Three times a day (TID) | INTRAMUSCULAR | Status: DC
  Administered 2020-09-25 – 2020-09-27 (×6): 5 mg via INTRAVENOUS
  Filled 2020-09-25 (×6): qty 2

## 2020-09-25 MED ORDER — POTASSIUM CHLORIDE IN NACL 20-0.9 MEQ/L-% IV SOLN
INTRAVENOUS | Status: DC
  Administered 2020-09-26: 1000 mL via INTRAVENOUS
  Filled 2020-09-25 (×2): qty 1000

## 2020-09-25 MED ORDER — MAGNESIUM SULFATE 2 GM/50ML IV SOLN
2.0000 g | Freq: Once | INTRAVENOUS | Status: AC
  Administered 2020-09-25: 2 g via INTRAVENOUS
  Filled 2020-09-25: qty 50

## 2020-09-25 NOTE — Progress Notes (Signed)
[facility] Progress Note     Subjective: Having some continued pain. Vomited up her clear breakfast tray she says.   Objective: Vital signs in last 24 hours: Temp:  [98.3 F (36.8 C)-98.5 F (36.9 C)] 98.3 F (36.8 C) (02/02 0900) Pulse Rate:  [53-58] 55 (02/02 0900) Resp:  [16-18] 18 (02/02 0900) BP: (114-164)/(70-80) 114/74 (02/02 0900) SpO2:  [97 %-99 %] 97 % (02/02 0900) Last BM Date: 09/23/20  Intake/Output from previous day: 02/01 0701 - 02/02 0700 In: 1000 [I.V.:1000] Out: 1 [Urine:1] Intake/Output this shift: No intake/output data recorded.  General appearance: alert and cooperative GI: soft, epigastric tenderness and RUQ  Lab Results:  Recent Labs    09/24/20 0100 09/25/20 0713  WBC 6.5 3.5*  HGB 11.0* 9.7*  HCT 33.5* 30.6*  PLT 195 153   BMET Recent Labs    09/24/20 0100 09/25/20 0713  NA 136 139  K 3.7 3.4*  CL 105 110  CO2 23 24  GLUCOSE 101* 92  BUN 14 8  CREATININE 0.75 0.66  CALCIUM 9.0 8.7*   PT/INR No results for input(s): LABPROT, INR in the last 72 hours.  Studies/Results: CT Abdomen Pelvis W Contrast  Result Date: 09/24/2020 CLINICAL DATA:  Nausea, vomiting and diarrhea for 2 days EXAM: CT ABDOMEN AND PELVIS WITH CONTRAST TECHNIQUE: Multidetector CT imaging of the abdomen and pelvis was performed using the standard protocol following bolus administration of intravenous contrast. CONTRAST:  169mL OMNIPAQUE IOHEXOL 300 MG/ML  SOLN COMPARISON:  CT 08/07/2020 04/22/2020 FINDINGS: Lower chest: Atelectatic changes in the wrist clear lung bases. Cardiac size is top normal. No pericardial effusion. Few coronary artery calcifications. Hepatobiliary: Few subcentimeter hypoattenuating foci in the liver too small to fully characterize on CT imaging but statistically likely benign. No concerning focal liver lesions. Smooth liver surface contour. Normal liver attenuation. Gallbladder contains multiple partially calcified gallstones  several of which appear partially pneumatized as well. There is some increasing pericholecystic inflammatory change when compared to prior imaging. No visible intraductal gallstones or biliary ductal dilatation is seen however. Pancreas: Redemonstration of the multilobulated cystic lesion in the head of the pancreas with few punctate calcifications again measuring up to 3.5 by 4.0 cm in maximum transaxial dimension, not significantly changed from comparison studies. Previously characterized on prior MR imaging 04/22/2020 with a differential including mucinous cystic neoplasm versus sequela of prior pancreatitis. Remaining portions of the pancreas are unremarkable without new or concerning pancreatic lesion, ductal dilatation, or peripancreatic inflammation. Spleen: Normal in size. No concerning splenic lesions. Adrenals/Urinary Tract: Normal adrenal glands. Kidneys are normal in size and position. Multiple simple and parapelvic cysts are noted bilaterally. A slightly more complex, partially exophytic peripherally calcified cystic lesion measuring up to 1.8 cm is seen arising from the upper pole right kidney also previously characterized on prior MR imaging is a complex though likely benign cyst. No new or concerning renal lesions. No frank hydronephrosis or obstructive urolithiasis. There is some mild faint perivesicular hazy stranding and some questionable layering debris versus asymmetric thickening of the left posterolateral bladder wall. Stomach/Bowel: Small sliding-type hiatal hernia. Proximal stomach is unremarkable. There is a circumferentially thickened appearance of the duodenal fold and gastric antrum. Mild mucosal hyperemia and possible ulceration (5/23) is present as well. More distal duodenum is unremarkable. No other small bowel thickening or dilatation. A normal appendix is visualized. No colonic dilatation or wall thickening. Scattered colonic diverticula without focal inflammation to suggest  diverticulitis. Relative lack of formed stool could reflect  a rapid transit state/diarrheal illness. Vascular/Lymphatic: Atherosclerotic calcification in the iliac arteries. Other significant vascular findings. No suspicious or enlarged lymph nodes in the included lymphatic chains. Reproductive: Lobular, heterogeneously enhancing lesions with uterus including both intramural and partially exophytic foci keeping with a diagnosis of uterine leiomyomata. No concerning adnexal lesions. Other: No abdominopelvic free fluid or free gas. No bowel containing hernias. Prior vertical midline incision. Mild circumferential body wall edema most pronounced posteriorly. Musculoskeletal: Multilevel degenerative changes are present in the imaged portions of the spine. More sclerotic changes about a region of disc desiccation at the T11-12 level is similar to prior and favored to be on a degenerative basis. Grade 1 anterolisthesis L4 on 5 without associated spondylolysis. 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 Additional moderate degenerative changes in the hips and pelvis. No acute or conspicuous osseous lesions. IMPRESSION: 1. Circumferentially thickened appearance of the gastric antrum and duodenal bulb with some anemia mucosal hyperemia which could reflect some gastritis/duodenitis with a site of possible ulceration. No evidence of frank perforation or abscess. Correlate with endoscopy as clinically warranted. 2. Cholelithiasis with some slightly increasing pericholecystic inflammatory change when compared to prior imaging. If there is clinical concern for acute cholecystitis, recommend further evaluation with right upper quadrant ultrasound. 3. Relative lack of formed stool could reflect a rapid transit state/diarrheal illness. 4. Unchanged appearance of the multilobulated cystic lesion in the head of the pancreas with few punctate calcifications. Previously characterized on prior  MR imaging 04/22/2020 with a differential including mucinous cystic neoplasm versus sequela of prior pancreatitis. 5. Minimally complex exophytic cystic lesion from the right kidney, previously characterized on MR imaging is a probable benign cyst. Additional fluid attenuation cysts in both kidneys. Acute urinary tract abnormality. 6. Fibroid uterus. 7. Aortic Atherosclerosis (ICD10-I70.0). Electronically Signed   By: Lovena Le M.D.   On: 09/24/2020 02:36   DG Chest Port 1 View  Result Date: 09/24/2020 CLINICAL DATA:  Shortness of breath and cough EXAM: PORTABLE CHEST 1 VIEW COMPARISON:  05/02/2020 FINDINGS: Cardiac shadow is stable. Lungs are well aerated bilaterally. No focal infiltrate or sizable effusion is seen. No bony abnormality is noted. IMPRESSION: No active disease. Electronically Signed   By: Inez Catalina M.D.   On: 09/24/2020 01:25   US ABDOMEN LIMITED RUQ (LIVER/GB)  Result Date: 09/24/2020 CLINICAL DATA:  Abdominal pain.  History of gallstones. EXAM: ULTRASOUND ABDOMEN LIMITED RIGHT UPPER QUADRANT COMPARISON:  Ultrasound 07/28/2020.  CT 07/28/2020. FINDINGS: Gallbladder: Gallstones again noted. Largest measures 1.6 cm. Gallbladder wall is minimally prominent at 3 mm. Trace pericholecystic fluid. Negative Murphy sign. Common bile duct: Diameter: 6 mm Liver: Increased echogenicity consistent fatty infiltration or hepatocellular disease. Portal vein is patent on color Doppler imaging with normal direction of blood flow towards the liver. Other: None. IMPRESSION: 1. Gallstones again noted. Largest measures 1.6 cm. Gallbladder wall is minimally prominent at 3 mm. Trace pericholecystic fluid. Cholecystitis cannot be excluded. Negative Murphy sign. No biliary distention. 2. Increased echogenicity of the liver consistent fatty infiltration or hepatocellular disease. Electronically Signed   By: Marcello Moores  Register   On: 09/24/2020 09:15    Anti-infectives: Anti-infectives (From admission, onward)    Start     Dose/Rate Route Frequency Ordered Stop   09/24/20 0445  Ampicillin-Sulbactam (UNASYN) 3 g in sodium chloride 0.9 % 100 mL IVPB        3 g 200 mL/hr over 30 Minutes Intravenous  Once 09/24/20 0441 09/24/20 [id number]  Assessment/Plan: Ms. Knudtson is a 65 yo with peptic ulcer disease who is having continued pain and vomited this AM. Notified Dr. Wynetta Emery and Dr. Laural Golden. No indication for cholecystectomy at this time.   LOS: 0 days    Virl Cagey 09/25/2020'

## 2020-09-25 NOTE — Progress Notes (Signed)
Subjective: Noted RUQ throbbing on admission but now described as burning. Discomfort is steady but not worsened from admission. No N/V. No overt GI bleeding. No diarrhea.   Objective: Vital signs in last 24 hours: Temp:  [98.4 F (36.9 C)-98.5 F (36.9 C)] 98.5 F (36.9 C) (02/01 2256) Pulse Rate:  [53-66] 53 (02/01 2256) Resp:  [16-19] 18 (02/01 2256) BP: (135-164)/(68-99) 142/80 (02/01 2256) SpO2:  [97 %-99 %] 98 % (02/01 2256) Last BM Date: 09/23/20 General:   Alert and oriented, pleasant, no distress Head:  Normocephalic and atraumatic. Eyes:  No icterus, sclera clear. Conjuctiva pink.  Abdomen:  Bowel sounds present, soft, TTP RLQ and RUQ, no rebound or guarding Neurologic:  Alert and  oriented x4 Psych:  Alert and cooperative. Normal mood and affect.  Intake/Output from previous day: 02/01 0701 - 02/02 0700 In: 1000 [I.V.:1000] Out: 1 [Urine:1] Intake/Output this shift: No intake/output data recorded.  Lab Results: Recent Labs    09/24/20 0100 09/25/20 0713  WBC 6.5 3.5*  HGB 11.0* 9.7*  HCT 33.5* 30.6*  PLT 195 153   BMET Recent Labs    09/24/20 0100 09/25/20 0713  NA 136 139  K 3.7 3.4*  CL 105 110  CO2 23 24  GLUCOSE 101* 92  BUN 14 8  CREATININE 0.75 0.66  CALCIUM 9.0 8.7*   LFT Recent Labs    09/24/20 0100 09/25/20 0713  PROT 7.1 5.7*  ALBUMIN 4.2 3.1*  AST 20 13*  ALT 10 7  ALKPHOS 45 37*  BILITOT 0.6 0.4     Studies/Results: CT Abdomen Pelvis W Contrast  Result Date: 09/24/2020 CLINICAL DATA:  Nausea, vomiting and diarrhea for 2 days EXAM: CT ABDOMEN AND PELVIS WITH CONTRAST TECHNIQUE: Multidetector CT imaging of the abdomen and pelvis was performed using the standard protocol following bolus administration of intravenous contrast. CONTRAST:  159mL OMNIPAQUE IOHEXOL 300 MG/ML  SOLN COMPARISON:  CT 08/07/2020 04/22/2020 FINDINGS: Lower chest: Atelectatic changes in the wrist clear lung bases. Cardiac size is top normal. No  pericardial effusion. Few coronary artery calcifications. Hepatobiliary: Few subcentimeter hypoattenuating foci in the liver too small to fully characterize on CT imaging but statistically likely benign. No concerning focal liver lesions. Smooth liver surface contour. Normal liver attenuation. Gallbladder contains multiple partially calcified gallstones several of which appear partially pneumatized as well. There is some increasing pericholecystic inflammatory change when compared to prior imaging. No visible intraductal gallstones or biliary ductal dilatation is seen however. Pancreas: Redemonstration of the multilobulated cystic lesion in the head of the pancreas with few punctate calcifications again measuring up to 3.5 by 4.0 cm in maximum transaxial dimension, not significantly changed from comparison studies. Previously characterized on prior MR imaging 04/22/2020 with a differential including mucinous cystic neoplasm versus sequela of prior pancreatitis. Remaining portions of the pancreas are unremarkable without new or concerning pancreatic lesion, ductal dilatation, or peripancreatic inflammation. Spleen: Normal in size. No concerning splenic lesions. Adrenals/Urinary Tract: Normal adrenal glands. Kidneys are normal in size and position. Multiple simple and parapelvic cysts are noted bilaterally. A slightly more complex, partially exophytic peripherally calcified cystic lesion measuring up to 1.8 cm is seen arising from the upper pole right kidney also previously characterized on prior MR imaging is a complex though likely benign cyst. No new or concerning renal lesions. No frank hydronephrosis or obstructive urolithiasis. There is some mild faint perivesicular hazy stranding and some questionable layering debris versus asymmetric thickening of the left posterolateral bladder wall. Stomach/Bowel:  Small sliding-type hiatal hernia. Proximal stomach is unremarkable. There is a circumferentially thickened  appearance of the duodenal fold and gastric antrum. Mild mucosal hyperemia and possible ulceration (5/23) is present as well. More distal duodenum is unremarkable. No other small bowel thickening or dilatation. A normal appendix is visualized. No colonic dilatation or wall thickening. Scattered colonic diverticula without focal inflammation to suggest diverticulitis. Relative lack of formed stool could reflect a rapid transit state/diarrheal illness. Vascular/Lymphatic: Atherosclerotic calcification in the iliac arteries. Other significant vascular findings. No suspicious or enlarged lymph nodes in the included lymphatic chains. Reproductive: Lobular, heterogeneously enhancing lesions with uterus including both intramural and partially exophytic foci keeping with a diagnosis of uterine leiomyomata. No concerning adnexal lesions. Other: No abdominopelvic free fluid or free gas. No bowel containing hernias. Prior vertical midline incision. Mild circumferential body wall edema most pronounced posteriorly. Musculoskeletal: Multilevel degenerative changes are present in the imaged portions of the spine. More sclerotic changes about a region of disc desiccation at the T11-12 level is similar to prior and favored to be on a degenerative basis. Grade 1 anterolisthesis L4 on 5 without associated spondylolysis. 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 Additional moderate degenerative changes in the hips and pelvis. No acute or conspicuous osseous lesions. IMPRESSION: 1. Circumferentially thickened appearance of the gastric antrum and duodenal bulb with some anemia mucosal hyperemia which could reflect some gastritis/duodenitis with a site of possible ulceration. No evidence of frank perforation or abscess. Correlate with endoscopy as clinically warranted. 2. Cholelithiasis with some slightly increasing pericholecystic inflammatory change when compared to prior imaging. If there is  clinical concern for acute cholecystitis, recommend further evaluation with right upper quadrant ultrasound. 3. Relative lack of formed stool could reflect a rapid transit state/diarrheal illness. 4. Unchanged appearance of the multilobulated cystic lesion in the head of the pancreas with few punctate calcifications. Previously characterized on prior MR imaging 04/22/2020 with a differential including mucinous cystic neoplasm versus sequela of prior pancreatitis. 5. Minimally complex exophytic cystic lesion from the right kidney, previously characterized on MR imaging is a probable benign cyst. Additional fluid attenuation cysts in both kidneys. Acute urinary tract abnormality. 6. Fibroid uterus. 7. Aortic Atherosclerosis (ICD10-I70.0). Electronically Signed   By: Lovena Le M.D.   On: 09/24/2020 02:36   DG Chest Port 1 View  Result Date: 09/24/2020 CLINICAL DATA:  Shortness of breath and cough EXAM: PORTABLE CHEST 1 VIEW COMPARISON:  05/02/2020 FINDINGS: Cardiac shadow is stable. Lungs are well aerated bilaterally. No focal infiltrate or sizable effusion is seen. No bony abnormality is noted. IMPRESSION: No active disease. Electronically Signed   By: Inez Catalina M.D.   On: 09/24/2020 01:25   US ABDOMEN LIMITED RUQ (LIVER/GB)  Result Date: 09/24/2020 CLINICAL DATA:  Abdominal pain.  History of gallstones. EXAM: ULTRASOUND ABDOMEN LIMITED RIGHT UPPER QUADRANT COMPARISON:  Ultrasound 07/28/2020.  CT 07/28/2020. FINDINGS: Gallbladder: Gallstones again noted. Largest measures 1.6 cm. Gallbladder wall is minimally prominent at 3 mm. Trace pericholecystic fluid. Negative Murphy sign. Common bile duct: Diameter: 6 mm Liver: Increased echogenicity consistent fatty infiltration or hepatocellular disease. Portal vein is patent on color Doppler imaging with normal direction of blood flow towards the liver. Other: None. IMPRESSION: 1. Gallstones again noted. Largest measures 1.6 cm. Gallbladder wall is minimally  prominent at 3 mm. Trace pericholecystic fluid. Cholecystitis cannot be excluded. Negative Murphy sign. No biliary distention. 2. Increased echogenicity of the liver consistent fatty infiltration or hepatocellular disease. Electronically Signed  ByMarcello Moores  Register   On: 09/24/2020 09:15    Assessment: 65 year old female with history of perforated peptic ulcer in August 2021, hospitalized again in Nov 2021 with N/V and abdominal pain, undergoing EGD with healed gastric ulcer and two non-bleeding cratered duodenal ulcers in duodenal bulb (H.pylori serology negative), returning this admission with recurrent abdominal pain, N/V/D and CT findings as above in setting of known prior PUD. Also noted to have cholelithiasis and US abdomen with minimally prominent gallbladder wall. Evaluated by surgery and not felt to be dealing with acute cholecystitis.   Clinically, she is stable from admission and notes change in quality of pain from throbbing on admission to now "burning" in RUQ, but she is in no distress and denies this discomfort worsened from admission. Normocytic anemia dating back to Aug 2021 when PUD first discovered.  Hgb 9.7 today, down from 11 but no overt GI bleeding. Follow clinically.   Acute symptoms related to ongoing PUD, and gastrin level now pending. Will trial full liquids today. Diarrhea has resolved. Hold on endoscopic evaluation at this time.   Pancreatic lesion: outpatient dedicated imaging via MRI in 4-6 months.   Plan: Full liquids Gastrin level pending Protonix IV BID: change to oral when advanced diet Mild hypokalemia per attending Outpatient EGD for ulcer healing Outpatient MRI for pancreatic lesion in 4-6 months  Annitta Needs, PhD, ANP-BC [facility]      LOS: 0 days    09/25/2020, 9:09 AM

## 2020-09-25 NOTE — Progress Notes (Signed)
PROGRESS NOTE   Maria Murphy  WUJ:[id number] DOB: 06-Jul-1956 DOA: 09/23/2020 PCP: Marcine Matar, MD   Chief Complaint  Patient presents with  . Diarrhea  . Emesis   Level of care: Med-Surg  Brief Admission History:  65 y.o. 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  She normally has chronic diarrhea with 3-4 episodes of bowel movements a day, but on Sunday noticed approximately 20 episodes of watery diarrhea as well as some nausea and vomiting that was quite significant.  Assessment & Plan:   Active Problems:   Acute gastroenteritis   Diarrhea   Nausea vomiting and diarrhea   1. Intractable nausea and vomiting - Unfortunately patient started vomiting again after clear liquid breakfast tray.  I spoke with Dr Karilyn Cota, make NPO for now and start low dose reglan, also added low dose lorazepam as needed for anxiety and nausea symptoms.   Continue PPI twice daily and carafate.  2. PUD with history of duodenal ulceration - Continue BID PPI therapy and carafate.  3. Essential hypertension - IV meds as needed.  4. History of PE/ acquired thrombophilia - temporarily holding rivaroxaban until n/v resolved.  5. GERD - BID protonix ordered.  Carafate ordered.   DVT prophylaxis: SCDs Code Status: Full  Family Communication:  Disposition:  Status is: Observation  The patient will require care spanning > 2 midnights and should be moved to inpatient because: IV treatments appropriate due to intensity of illness or inability to take PO and Inpatient level of care appropriate due to severity of illness  Dispo: The patient is from: Home              Anticipated d/c is to: Home              Anticipated d/c date is: 2 days              Patient currently is not medically stable to d/c.   Difficult to place  patient No       Consultants:   GI   Procedures:   N/a   Antimicrobials:    Subjective: Pt started vomiting after clear liquid breakfast tray was eaten.    Objective: Vitals:   09/24/20 1254 09/24/20 1934 09/24/20 2256 09/25/20 0900  BP: (!) 164/73 135/70 (!) 142/80 114/74  Pulse: (!) 54 (!) 58 (!) 53 (!) 55  Resp: 16 18 18 18   Temp: 98.5 F (36.9 C) 98.4 F (36.9 C) 98.5 F (36.9 C) 98.3 F (36.8 C)  TempSrc:  Oral Axillary   SpO2: 99% 98% 98% 97%  Weight:      Height:        Intake/Output Summary (Last 24 hours) at 09/25/2020 1209 Last data filed at 09/25/2020 0500 Gross per 24 hour  Intake 1000 ml  Output 1 ml  Net 999 ml   Filed Weights   09/23/20 2233  Weight: 86.2 kg    Examination:  General exam: Appears calm and comfortable  Respiratory system: Clear to auscultation. Respiratory effort normal. Cardiovascular system: normal S1 & S2 heard. No JVD, murmurs, rubs, gallops or clicks. No pedal edema. Gastrointestinal system: Abdomen is nondistended, soft and nontender. No organomegaly or masses felt. Normal bowel sounds heard. Central nervous system: Alert and oriented. No focal neurological deficits. Extremities: Symmetric 5 x 5 power. Skin: No rashes, lesions  or ulcers Psychiatry: Judgement and insight appear normal. Mood & affect appropriate.   Data Reviewed: I have personally reviewed following labs and imaging studies  CBC: Recent Labs  Lab 09/24/20 0100 09/25/20 0713  WBC 6.5 3.5*  NEUTROABS 5.0  --   HGB 11.0* 9.7*  HCT 33.5* 30.6*  MCV 85.5 86.7  PLT 195 153    Basic Metabolic Panel: Recent Labs  Lab 09/24/20 0100 09/25/20 0713  NA 136 139  K 3.7 3.4*  CL 105 110  CO2 23 24  GLUCOSE 101* 92  BUN 14 8  CREATININE 0.75 0.66  CALCIUM 9.0 8.7*  MG  --  1.7    GFR: Estimated Creatinine Clearance: 75.5 mL/min (by C-G formula based on SCr of 0.66 mg/dL).  Liver Function Tests: Recent Labs  Lab 09/24/20 0100 09/25/20 0713   AST 20 13*  ALT 10 7  ALKPHOS 45 37*  BILITOT 0.6 0.4  PROT 7.1 5.7*  ALBUMIN 4.2 3.1*    CBG: No results for input(s): GLUCAP in the last 168 hours.  Recent Results (from the past 240 hour(s))  SARS Coronavirus 2 by RT PCR (hospital order, performed in [facility] hospital lab) Nasopharyngeal Nasopharyngeal Swab     Status: None   Collection Time: 09/24/20  7:22 AM   Specimen: Nasopharyngeal Swab  Result Value Ref Range Status   SARS Coronavirus 2 NEGATIVE NEGATIVE Final    Comment: (NOTE) SARS-CoV-2 target nucleic acids are NOT DETECTED.  The SARS-CoV-2 RNA is generally detectable in upper and lower respiratory specimens during the acute phase of infection. The lowest concentration of SARS-CoV-2 viral copies this assay can detect is 250 copies / mL. A negative result does not preclude SARS-CoV-2 infection and should not be used as the sole basis for treatment or other patient management decisions.  A negative result may occur with improper specimen collection / handling, submission of specimen other than nasopharyngeal swab, presence of viral mutation(s) within the areas targeted by this assay, and inadequate number of viral copies (<250 copies / mL). A negative result must be combined with clinical observations, patient history, and epidemiological information.  Fact Sheet for Patients:   BoilerBrush.com.cy  Fact Sheet for Healthcare Providers: https://pope.com/  This test is not yet approved or  cleared by the [place] FDA and has been authorized for detection and/or diagnosis of SARS-CoV-2 by FDA under an Emergency Use Authorization (EUA).  This EUA will remain in effect (meaning this test can be used) for the duration of the COVID-19 declaration under Section 564(b)(1) of the Act, 21 U.S.C. section 360bbb-3(b)(1), unless the authorization is terminated or revoked sooner.  Performed at [facility], [address], [place], [place] [address]      Radiology Studies: CT Abdomen Pelvis W Contrast  Result Date: 09/24/2020 CLINICAL DATA:  Nausea, vomiting and diarrhea for 2 days EXAM: CT ABDOMEN AND PELVIS WITH CONTRAST TECHNIQUE: Multidetector CT imaging of the abdomen and pelvis was performed using the standard protocol following bolus administration of intravenous contrast. CONTRAST:  OMNIPAQUE IOHEXOL 300 MG/ML  SOLN COMPARISON:  CT 08/07/2020 04/22/2020 FINDINGS: Lower chest: Atelectatic changes in the wrist clear lung bases. Cardiac size is top normal. No pericardial effusion. Few coronary artery calcifications. Hepatobiliary: Few subcentimeter hypoattenuating foci in the liver too small to fully characterize on CT imaging but statistically likely benign. No concerning focal liver lesions. Smooth liver surface contour. Normal liver attenuation. Gallbladder contains multiple partially calcified gallstones several of which appear partially pneumatized  as well. There is some increasing pericholecystic inflammatory change when compared to prior imaging. No visible intraductal gallstones or biliary ductal dilatation is seen however. Pancreas: Redemonstration of the multilobulated cystic lesion in the head of the pancreas with few punctate calcifications again measuring up to 3.5 by 4.0 cm in maximum transaxial dimension, not significantly changed from comparison studies. Previously characterized on prior MR imaging 04/22/2020 with a differential including mucinous cystic neoplasm versus sequela of prior pancreatitis. Remaining portions of the pancreas are unremarkable without new or concerning pancreatic lesion, ductal dilatation, or peripancreatic inflammation. Spleen: Normal in size. No concerning splenic lesions. Adrenals/Urinary Tract: Normal adrenal glands. Kidneys are normal in size and position. Multiple simple and parapelvic cysts are noted bilaterally. A slightly more complex, partially exophytic  peripherally calcified cystic lesion measuring up to 1.8 cm is seen arising from the upper pole right kidney also previously characterized on prior MR imaging is a complex though likely benign cyst. No new or concerning renal lesions. No frank hydronephrosis or obstructive urolithiasis. There is some mild faint perivesicular hazy stranding and some questionable layering debris versus asymmetric thickening of the left posterolateral bladder wall. Stomach/Bowel: Small sliding-type hiatal hernia. Proximal stomach is unremarkable. There is a circumferentially thickened appearance of the duodenal fold and gastric antrum. Mild mucosal hyperemia and possible ulceration (5/23) is present as well. More distal duodenum is unremarkable. No other small bowel thickening or dilatation. A normal appendix is visualized. No colonic dilatation or wall thickening. Scattered colonic diverticula without focal inflammation to suggest diverticulitis. Relative lack of formed stool could reflect a rapid transit state/diarrheal illness. Vascular/Lymphatic: Atherosclerotic calcification in the iliac arteries. Other significant vascular findings. No suspicious or enlarged lymph nodes in the included lymphatic chains. Reproductive: Lobular, heterogeneously enhancing lesions with uterus including both intramural and partially exophytic foci keeping with a diagnosis of uterine leiomyomata. No concerning adnexal lesions. Other: No abdominopelvic free fluid or free gas. No bowel containing hernias. Prior vertical midline incision. Mild circumferential body wall edema most pronounced posteriorly. Musculoskeletal: Multilevel degenerative changes are present in the imaged portions of the spine. More sclerotic changes about a region of disc desiccation at the T11-12 level is similar to prior and favored to be on a degenerative basis. Grade 1 anterolisthesis L4 on 5 without associated spondylolysis. 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 Additional moderate degenerative changes in the hips and pelvis. No acute or conspicuous osseous lesions. IMPRESSION: 1. Circumferentially thickened appearance of the gastric antrum and duodenal bulb with some anemia mucosal hyperemia which could reflect some gastritis/duodenitis with a site of possible ulceration. No evidence of frank perforation or abscess. Correlate with endoscopy as clinically warranted. 2. Cholelithiasis with some slightly increasing pericholecystic inflammatory change when compared to prior imaging. If there is clinical concern for acute cholecystitis, recommend further evaluation with right upper quadrant ultrasound. 3. Relative lack of formed stool could reflect a rapid transit state/diarrheal illness. 4. Unchanged appearance of the multilobulated cystic lesion in the head of the pancreas with few punctate calcifications. Previously characterized on prior MR imaging 04/22/2020 with a differential including mucinous cystic neoplasm versus sequela of prior pancreatitis. 5. Minimally complex exophytic cystic lesion from the right kidney, previously characterized on MR imaging is a probable benign cyst. Additional fluid attenuation cysts in both kidneys. Acute urinary tract abnormality. 6. Fibroid uterus. 7. Aortic Atherosclerosis (ICD10-I70.0). Electronically Signed   By: Kreg Shropshire M.D.   On: 09/24/2020 02:36   DG Chest [facility]  Result Date: 09/24/2020 CLINICAL DATA:  Shortness of breath and cough EXAM: PORTABLE CHEST 1 VIEW COMPARISON:  05/02/2020 FINDINGS: Cardiac shadow is stable. Lungs are well aerated bilaterally. No focal infiltrate or sizable effusion is seen. No bony abnormality is noted. IMPRESSION: No active disease. Electronically Signed   By: Alcide Clever M.D.   On: 09/24/2020 01:25   US ABDOMEN LIMITED RUQ (LIVER/GB)  Result Date: 09/24/2020 CLINICAL DATA:  Abdominal pain.  History of gallstones. EXAM: ULTRASOUND ABDOMEN LIMITED  RIGHT UPPER QUADRANT COMPARISON:  Ultrasound 07/28/2020.  CT 07/28/2020. FINDINGS: Gallbladder: Gallstones again noted. Largest measures 1.6 cm. Gallbladder wall is minimally prominent at 3 mm. Trace pericholecystic fluid. Negative Murphy sign. Common bile duct: Diameter: 6 mm Liver: Increased echogenicity consistent fatty infiltration or hepatocellular disease. Portal vein is patent on color Doppler imaging with normal direction of blood flow towards the liver. Other: None. IMPRESSION: 1. Gallstones again noted. Largest measures 1.6 cm. Gallbladder wall is minimally prominent at 3 mm. Trace pericholecystic fluid. Cholecystitis cannot be excluded. Negative Murphy sign. No biliary distention. 2. Increased echogenicity of the liver consistent fatty infiltration or hepatocellular disease. Electronically Signed   By: Maisie Fus  Register   On: 09/24/2020 09:15   Scheduled Meds: . citalopram  20 mg Oral Daily  . donepezil  5 mg Oral QHS  . gabapentin  300 mg Oral BID  . metoCLOPramide (REGLAN) injection  5 mg Intravenous Q8H  . pantoprazole (PROTONIX) IV  40 mg Intravenous Q12H  . sucralfate  1 g Oral TID WC & HS  . traZODone  200 mg Oral QHS   Continuous Infusions: . 0.9 % NaCl with KCl 20 mEq / L    . magnesium sulfate bolus IVPB      LOS: 0 days   Time spent: 38 mins   Joseangel Nettleton Laural Benes, MD How to contact the [facility] Attending or Consulting provider 7A - 7P or covering provider during after hours 7P -7A, for this patient?  1. Check the care team in [facility] and look for a) attending/consulting TRH provider listed and b) the [facility] team listed 2. Log into [email or web address] and use [place]'s universal password to access. If you do not have the password, please contact the hospital operator. 3. Locate the [facility] provider you are looking for under [facility] and page to a number that you can be directly reached. 4. If you still have difficulty reaching the provider, please page the [facility] (Director on Call) for  the Hospitalists listed on amion for assistance.  09/25/2020, 12:09 PM

## 2020-09-25 NOTE — Progress Notes (Signed)
Patient sleeping at present.  IV NS with 20 KCL infusing at 75 cc/hr.  Site WNL.  No acute distress noted.

## 2020-09-26 ENCOUNTER — Encounter (HOSPITAL_COMMUNITY): Payer: Self-pay | Admitting: Family Medicine

## 2020-09-26 DIAGNOSIS — R112 Nausea with vomiting, unspecified: Secondary | ICD-10-CM

## 2020-09-26 DIAGNOSIS — K529 Noninfective gastroenteritis and colitis, unspecified: Secondary | ICD-10-CM | POA: Diagnosis not present

## 2020-09-26 LAB — COMPREHENSIVE METABOLIC PANEL
ALT: 9 U/L (ref 0–44)
AST: 15 U/L (ref 15–41)
Albumin: 3 g/dL — ABNORMAL LOW (ref 3.5–5.0)
Alkaline Phosphatase: 35 U/L — ABNORMAL LOW (ref 38–126)
Anion gap: 5 (ref 5–15)
BUN: 7 mg/dL — ABNORMAL LOW (ref 8–23)
CO2: 22 mmol/L (ref 22–32)
Calcium: 8.4 mg/dL — ABNORMAL LOW (ref 8.9–10.3)
Chloride: 112 mmol/L — ABNORMAL HIGH (ref 98–111)
Creatinine, Ser: 0.72 mg/dL (ref 0.44–1.00)
GFR, Estimated: >60 mL/min (ref >60)
Glucose, Bld: 88 mg/dL (ref 70–99)
Potassium: 4 mmol/L (ref 3.5–5.1)
Sodium: 139 mmol/L (ref 135–145)
Total Bilirubin: 0.4 mg/dL (ref 0.3–1.2)
Total Protein: 5.2 g/dL — ABNORMAL LOW (ref 6.5–8.1)

## 2020-09-26 LAB — GASTRIN: Gastrin: 17 pg/mL (ref 0–115)

## 2020-09-26 LAB — CBC
HCT: 30.4 % — ABNORMAL LOW (ref 36.0–46.0)
Hemoglobin: 9.5 g/dL — ABNORMAL LOW (ref 12.0–15.0)
MCH: 27.6 pg (ref 26.0–34.0)
MCHC: 31.3 g/dL (ref 30.0–36.0)
MCV: 88.4 fL (ref 80.0–100.0)
Platelets: 150 10*3/uL (ref 150–400)
RBC: 3.44 MIL/uL — ABNORMAL LOW (ref 3.87–5.11)
RDW: 15.1 % (ref 11.5–15.5)
WBC: 3.4 10*3/uL — ABNORMAL LOW (ref 4.0–10.5)
nRBC: 0 % (ref 0.0–0.2)

## 2020-09-26 LAB — MAGNESIUM: Magnesium: 1.9 mg/dL (ref 1.7–2.4)

## 2020-09-26 MED ORDER — RIVAROXABAN 20 MG PO TABS
20.0000 mg | ORAL_TABLET | Freq: Every day | ORAL | Status: DC
  Administered 2020-09-26: 20 mg via ORAL
  Filled 2020-09-26 (×4): qty 1

## 2020-09-26 MED ORDER — GABAPENTIN 300 MG PO CAPS
ORAL_CAPSULE | ORAL | Status: AC
  Filled 2020-09-26: qty 1
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
## 2020-09-26 NOTE — Progress Notes (Signed)
Patient informs MD that she vomited her breakfast this am. No evidence seen, patient verbalizes that she went to the restroom and vomited. Patient is instructed to call the nurse or medical team when she is nauseated. Refused any medication for nausea at this time.

## 2020-09-26 NOTE — OR Nursing (Signed)
Assisted patient to restroom. Denies any further needs at this time.

## 2020-09-26 NOTE — Progress Notes (Signed)
Up to the Bathroom with assistance. Voided large amount of clear yellow urine. Assisted back to bed.  NAD.

## 2020-09-26 NOTE — Progress Notes (Signed)
PROGRESS NOTE   Maria Murphy  ALP:[id number] DOB: 1956-04-29 DOA: 09/23/2020 PCP: Ladell Pier, MD   Chief Complaint  Patient presents with  . Diarrhea  . Emesis   Level of care: Med-Surg  Brief Admission History:  65 y.o. 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  She normally has chronic diarrhea with 3-4 episodes of bowel movements a day, but on Sunday noticed approximately 20 episodes of watery diarrhea as well as some nausea and vomiting that was quite significant.  Assessment & Plan:   Active Problems:   Acute gastroenteritis   Diarrhea   Nausea vomiting and diarrhea   Intractable nausea and vomiting  1. Intractable nausea and vomiting - Unfortunately patient vomited her soft diet breakfast, will go back to full liquids as she tolerated them yesterday.  GI recommended low dose reglan, also added low dose lorazepam as needed for anxiety and nausea symptoms.   Continue PPI twice daily and carafate.  2. PUD with history of duodenal ulceration - Continue BID PPI therapy and carafate.  3. Essential hypertension - IV meds as needed.  4. History of PE/ acquired thrombophilia - restarting rivaroxaban today.  5. GERD - BID protonix ordered.  Carafate ordered.   DVT prophylaxis: SCDs Code Status: Full  Family Communication:  Disposition:  Status is: Inpatient   The patient will require care spanning > 2 midnights and should be moved to inpatient because: IV treatments appropriate due to intensity of illness or inability to take PO and Inpatient level of care appropriate due to severity of illness  Dispo: The patient is from: Home              Anticipated d/c is to: Home              Anticipated d/c date is: 1 day              Patient currently is not medically stable to d/c.   Difficult  to place patient No  Consultants:   GI   Procedures:   N/a   Antimicrobials:    Subjective: Pt reports that she vomited all of her soft diet breakfast this morning.    Objective: Vitals:   09/25/20 1540 09/25/20 2200 09/25/20 2300 09/26/20 0749  BP: 99/65 117/70 110/67 116/66  Pulse: 63 (!) 56 (!) 50 (!) 53  Resp: 18 18 18 14   Temp: 98.1 F (36.7 C) 98.3 F (36.8 C) 98.3 F (36.8 C) 97.7 F (36.5 C)  TempSrc: Oral Oral Oral Oral  SpO2: 95% 98% 97% 96%  Weight:      Height:        Intake/Output Summary (Last 24 hours) at 09/26/2020 1310 Last data filed at 09/26/2020 1030 Gross per 24 hour  Intake 2380 ml  Output 1 ml  Net 2379 ml   Filed Weights   09/23/20 2233  Weight: 86.2 kg    Examination:  General exam: Appears calm and comfortable  Respiratory system: Clear to auscultation. Respiratory effort normal. Cardiovascular system: normal S1 & S2 heard. No JVD, murmurs, rubs, gallops or clicks. No pedal edema. Gastrointestinal system: Abdomen is nondistended, soft and nontender. No organomegaly or masses felt. Normal bowel sounds heard. Central nervous system: Alert and oriented. No focal neurological deficits. Extremities: Symmetric 5 x 5 power. Skin: No rashes, lesions or ulcers Psychiatry:  Judgement and insight appear normal. Mood & affect appropriate.   Data Reviewed: I have personally reviewed following labs and imaging studies  CBC: Recent Labs  Lab 09/24/20 0100 09/25/20 0713 09/26/20 0733  WBC 6.5 3.5* 3.4*  NEUTROABS 5.0  --   --   HGB 11.0* 9.7* 9.5*  HCT 33.5* 30.6* 30.4*  MCV 85.5 86.7 88.4  PLT 195 153 [id number]    Basic Metabolic Panel: Recent Labs  Lab 09/24/20 0100 09/25/20 0713 09/26/20 0733  NA 136 139 139  K 3.7 3.4* 4.0  CL 105 110 112*  CO2 23 24 22   GLUCOSE 101* 92 88  BUN 14 8 7*  CREATININE 0.75 0.66 0.72  CALCIUM 9.0 8.7* 8.4*  MG  --  1.7 1.9    GFR: Estimated Creatinine Clearance: 75.5 mL/min (by C-G formula based  on SCr of 0.72 mg/dL).  Liver Function Tests: Recent Labs  Lab 09/24/20 0100 09/25/20 0713 09/26/20 0733  AST 20 13* 15  ALT 10 7 9   ALKPHOS 45 37* 35*  BILITOT 0.6 0.4 0.4  PROT 7.1 5.7* 5.2*  ALBUMIN 4.2 3.1* 3.0*    CBG: No results for input(s): GLUCAP in the last 168 hours.  Recent Results (from the past 240 hour(s))  SARS Coronavirus 2 by RT PCR (hospital order, performed in [facility] hospital lab) Nasopharyngeal Nasopharyngeal Swab     Status: None   Collection Time: 09/24/20  7:22 AM   Specimen: Nasopharyngeal Swab  Result Value Ref Range Status   SARS Coronavirus 2 NEGATIVE NEGATIVE Final    Comment: (NOTE) SARS-CoV-2 target nucleic acids are NOT DETECTED.  The SARS-CoV-2 RNA is generally detectable in upper and lower respiratory specimens during the acute phase of infection. The lowest concentration of SARS-CoV-2 viral copies this assay can detect is 250 copies / mL. A negative result does not preclude SARS-CoV-2 infection and should not be used as the sole basis for treatment or other patient management decisions.  A negative result may occur with improper specimen collection / handling, submission of specimen other than nasopharyngeal swab, presence of viral mutation(s) within the areas targeted by this assay, and inadequate number of viral copies (<250 copies / mL). A negative result must be combined with clinical observations, patient history, and epidemiological information.  Fact Sheet for Patients:   StrictlyIdeas.no  Fact Sheet for Healthcare Providers: BankingDealers.co.za  This test is not yet approved or  cleared by the [place] FDA and has been authorized for detection and/or diagnosis of SARS-CoV-2 by FDA under an Emergency Use Authorization (EUA).  This EUA will remain in effect (meaning this test can be used) for the duration of the COVID-19 declaration under Section 564(b)(1) of the Act, 21  U.S.C. section 360bbb-3(b)(1), unless the authorization is terminated or revoked sooner.  Performed at [facility], [address], [place], [place] [address]      Radiology Studies: No results found. Scheduled Meds: . citalopram  20 mg Oral Daily  . donepezil  5 mg Oral QHS  . gabapentin  300 mg Oral BID  . metoCLOPramide (REGLAN) injection  5 mg Intravenous Q8H  . pantoprazole (PROTONIX) IV  40 mg Intravenous Q12H  . sucralfate  1 g Oral TID WC & HS  . traZODone  200 mg Oral QHS   Continuous Infusions: . 0.9 % NaCl with KCl 20 mEq / L 50 mL/hr at 09/26/20 0819    LOS: 1 day   Time spent: 35 mins   Clanford Johnson,  MD How to contact the [facility] Attending or Consulting provider [place] or covering provider during after hours [place], for this patient?  1. Check the care team in [facility] and look for a) attending/consulting [facility] provider listed and b) the [facility] team listed 2. Log into [email or web address] and use [place]'s universal password to access. If you do not have the password, please contact the hospital operator. 3. Locate the [facility] provider you are looking for under [facility] and page to a number that you can be directly reached. 4. If you still have difficulty reaching the provider, please page the [facility] (Director on Call) for the Hospitalists listed on amion for assistance.  09/26/2020, 1:10 PM

## 2020-09-26 NOTE — Plan of Care (Signed)
  Problem: Acute Rehab PT Goals(only PT should resolve) Goal: Pt Will Go Supine/Side To Sit Outcome: Progressing Flowsheets (Taken 09/26/2020 1520) Pt will go Supine/Side to Sit:  with modified independence  Independently Goal: Patient Will Transfer Sit To/From Stand Outcome: Progressing Flowsheets (Taken 09/26/2020 1520) Patient will transfer sit to/from stand:  with modified independence  with supervision Goal: Pt Will Transfer Bed To Chair/Chair To Bed Outcome: Progressing Flowsheets (Taken 09/26/2020 1520) Pt will Transfer Bed to Chair/Chair to Bed:  with modified independence  with supervision Goal: Pt Will Ambulate Outcome: Progressing Flowsheets (Taken 09/26/2020 1520) Pt will Ambulate:  75 feet  with modified independence  with supervision  with rolling walker   3:20 PM, 09/26/20 Lonell Grandchild, MPT Physical Therapist with [facility] [phone] office [phone] mobile phone

## 2020-09-26 NOTE — Evaluation (Signed)
Physical Therapy Evaluation Patient Details Name: Maria Murphy MRN: [id number] DOB: 1956-08-04 Today's Date: 09/26/2020   History of Present Illness  [place] Dingley is a 65 y.o. 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  She normally has chronic diarrhea with 3-4 episodes of bowel movements a day, but on Sunday noticed approximately 20 episodes of watery diarrhea as well as some nausea and vomiting that was quite significant.  She describes a new right upper quadrant abdominal pain that is cramping in nature with no radiation.  She states that eating seems to make the pain and symptoms worse.  She denies any recent change in her diet or sick contacts.  She has completed Covid vaccination, but has not received a booster.  She denies any cough, shortness of breath, chest pain, or congestion.  She has been taking her home medications to include Xarelto as prescribed for prior PEs. She has had a recent HIDA scan on 1/26 that was negative for any acute findings.    Clinical Impression  Patient functioning near baseline for functional mobility and gait other than having to lean on IV pole when attempting ambulation using SPC, had to use RW for safety with improvement in balance noted and able to ambulate safely.  Patient limited most due to c/o fatigue and mild SOB.  Patient encouraged to continue ambulating to bathroom and stay out of bed as much as possible - RN notified.  Patient will benefit from continued physical therapy in hospital and recommended venue below to increase strength, balance, endurance for safe ADLs and gait.     Follow Up Recommendations Home health PT;Supervision for mobility/OOB;Supervision - Intermittent    Equipment Recommendations  None recommended by PT    Recommendations for Other Services        Precautions / Restrictions Precautions Precautions: Fall Restrictions Weight Bearing Restrictions: No      Mobility  Bed Mobility Overal bed mobility: Modified Independent                  Transfers Overall transfer level: Needs assistance Equipment used: Straight cane;Rolling walker (2 wheeled) Transfers: Sit to/from [employer] Transfers Sit to Stand: Supervision Stand pivot transfers: Supervision       General transfer comment: has to hold onto IV pole when using SPC, safer using RW  Ambulation/Gait Ambulation/Gait assistance: Supervision Gait Distance (Feet): 60 Feet Assistive device: Rolling walker (2 wheeled);Straight cane Gait Pattern/deviations: Decreased step length - right;Decreased step length - left;Decreased stride length Gait velocity: decreased   General Gait Details: has to support self using IV pole when ambulating with SPC, safer using RW demonstrating slow labored cadence without loss of balance, limited mostly due to fatigue  Stairs            Wheelchair Mobility    Modified Rankin (Stroke Patients Only)       Balance Overall balance assessment: Needs assistance Sitting-balance support: Feet supported;No upper extremity supported Sitting balance-Leahy Scale: Good Sitting balance - Comments: seated at EOB   Standing balance support: During functional activity;Single extremity supported Standing balance-Leahy Scale: Poor Standing balance comment: fair/poor using SPC, fair/good using RW                             Pertinent Vitals/Pain Pain Assessment: 0-10 Pain Score:  3  Pain Location: upper right sided stomach Pain Descriptors / Indicators: Discomfort;Aching Pain Intervention(s): Limited activity within patient's tolerance;Monitored during session    Home Living Family/patient expects to be discharged to:: Private residence Living Arrangements: Alone Available Help at Discharge: Available  PRN/intermittently;Family Type of Home: Apartment Home Access: Stairs to enter Entrance Stairs-Rails: None Entrance Stairs-Number of Steps: 1 Home Layout: One level Home Equipment: Cane - single point;Walker - 2 wheels Additional Comments: patient usually takes baths using sink due to unable to step over tub to get into shower    Prior Function Level of Independence: Needs assistance   Gait / Transfers Assistance Needed: household ambulator using RW or SPC  ADL's / Homemaking Assistance Needed: assisted by family and home agencies for community ADL's        Hand Dominance   Dominant Hand: Right    Extremity/Trunk Assessment   Upper Extremity Assessment Upper Extremity Assessment: Overall WFL for tasks assessed    Lower Extremity Assessment Lower Extremity Assessment: Generalized weakness    Cervical / Trunk Assessment Cervical / Trunk Assessment: Kyphotic  Communication   Communication: No difficulties  Cognition Arousal/Alertness: Awake/alert Behavior During Therapy: WFL for tasks assessed/performed Overall Cognitive Status: Within Functional Limits for tasks assessed                                        General Comments      Exercises     Assessment/Plan    PT Assessment Patient needs continued PT services  PT Problem List Decreased strength;Decreased activity tolerance;Decreased balance;Decreased mobility       PT Treatment Interventions DME instruction;Gait training;Stair training;Functional mobility training;Therapeutic activities;Therapeutic exercise;Patient/family education;Balance training    PT Goals (Current goals can be found in the Care Plan section)  Acute Rehab PT Goals Patient Stated Goal: return home with family to assist PT Goal Formulation: With patient Time For Goal Achievement: 09/30/20 Potential to Achieve Goals: Good    Frequency Min 3X/week   Barriers to discharge        Co-evaluation                AM-PAC PT "6 Clicks" Mobility  Outcome Measure Help needed turning from your back to your side while in a flat bed without using bedrails?: None Help needed moving from lying on your back to sitting on the side of a flat bed without using bedrails?: None Help needed moving to and from a bed to a chair (including a wheelchair)?: A Little Help needed standing up from a chair using your arms (e.g., wheelchair or bedside chair)?: A Little Help needed to walk in hospital room?: A Little Help needed climbing 3-5 steps with a railing? : A Lot 6 Click Score: 19    End of Session   Activity Tolerance: Patient tolerated treatment well;Patient limited by fatigue Patient left: in bed;with call bell/phone within reach Nurse Communication: Mobility status PT Visit Diagnosis: Unsteadiness on feet (R26.81);Other abnormalities of gait and mobility (R26.89);Muscle weakness (generalized) (M62.81)    Time: 6063-0160 PT Time Calculation (min) (ACUTE ONLY): 21 min   Charges:   PT Evaluation $PT Eval Moderate Complexity: 1 Mod PT Treatments $Therapeutic Activity: 8-22 mins        3:19 PM, 09/26/20 Lonell Grandchild, MPT Physical Therapist with [facility] [phone] office [phone] mobile phone

## 2020-09-26 NOTE — Progress Notes (Signed)
Late entry  Subjective:  Patient slept well.  Feels hungry.  Has not had breakfast. no BMs.  Positive flatus.  Burning upper abdominal pain much less.  Objective: Vital signs in last 24 hours: Temp:  [97.7 F (36.5 C)-98.3 F (36.8 C)] 97.7 F (36.5 C) (02/03 0749) Pulse Rate:  [50-56] 53 (02/03 0749) Resp:  [14-18] 14 (02/03 0749) BP: (110-117)/(66-70) 116/66 (02/03 0749) SpO2:  [96 %-98 %] 96 % (02/03 0749) Last BM Date: 09/25/20 General:   Alert,  Well-developed, well-nourished, pleasant and cooperative in NAD Head:  Normocephalic and atraumatic. Eyes:  Sclera clear, no icterus.  Abdomen:  Soft, mild epigastric tenderness Normal bowel sounds, without guarding, and without rebound.   Extremities:  Without clubbing, deformity or edema. Neurologic:  Alert and  oriented x4;  grossly normal neurologically. Skin:  Intact without significant lesions or rashes. Psych:  Alert and cooperative. Normal mood and affect.  Intake/Output from previous day: 02/02 0701 - 02/03 0700 In: 1980 [P.O.:1080; I.V.:900] Out: 1 [Urine:1] Intake/Output this shift: Total I/O In: 600 [P.O.:600] Out: -   Lab Results: CBC Recent Labs    09/24/20 0100 09/25/20 0713 09/26/20 0733  WBC 6.5 3.5* 3.4*  HGB 11.0* 9.7* 9.5*  HCT 33.5* 30.6* 30.4*  MCV 85.5 86.7 88.4  PLT 195 153 150   BMET Recent Labs    09/24/20 0100 09/25/20 0713 09/26/20 0733  NA 136 139 139  K 3.7 3.4* 4.0  CL 105 110 112*  CO2 23 24 22   GLUCOSE 101* 92 88  BUN 14 8 7*  CREATININE 0.75 0.66 0.72  CALCIUM 9.0 8.7* 8.4*   LFTs Recent Labs    09/24/20 0100 09/25/20 0713 09/26/20 0733  BILITOT 0.6 0.4 0.4  ALKPHOS 45 37* 35*  AST 20 13* 15  ALT 10 7 9   PROT 7.1 5.7* 5.2*  ALBUMIN 4.2 3.1* 3.0*   Recent Labs    09/24/20 0100  LIPASE 33   PT/INR No results for input(s): LABPROT, INR in the last 72 hours.    Imaging Studies: CT Abdomen Pelvis W Contrast  Result Date: 09/24/2020 CLINICAL DATA:  Nausea,  vomiting and diarrhea for 2 days EXAM: CT ABDOMEN AND PELVIS WITH CONTRAST TECHNIQUE: Multidetector CT imaging of the abdomen and pelvis was performed using the standard protocol following bolus administration of intravenous contrast. CONTRAST:  158mL OMNIPAQUE IOHEXOL 300 MG/ML  SOLN COMPARISON:  CT 08/07/2020 04/22/2020 FINDINGS: Lower chest: Atelectatic changes in the wrist clear lung bases. Cardiac size is top normal. No pericardial effusion. Few coronary artery calcifications. Hepatobiliary: Few subcentimeter hypoattenuating foci in the liver too small to fully characterize on CT imaging but statistically likely benign. No concerning focal liver lesions. Smooth liver surface contour. Normal liver attenuation. Gallbladder contains multiple partially calcified gallstones several of which appear partially pneumatized as well. There is some increasing pericholecystic inflammatory change when compared to prior imaging. No visible intraductal gallstones or biliary ductal dilatation is seen however. Pancreas: Redemonstration of the multilobulated cystic lesion in the head of the pancreas with few punctate calcifications again measuring up to 3.5 by 4.0 cm in maximum transaxial dimension, not significantly changed from comparison studies. Previously characterized on prior MR imaging 04/22/2020 with a differential including mucinous cystic neoplasm versus sequela of prior pancreatitis. Remaining portions of the pancreas are unremarkable without new or concerning pancreatic lesion, ductal dilatation, or peripancreatic inflammation. Spleen: Normal in size. No concerning splenic lesions. Adrenals/Urinary Tract: Normal adrenal glands. Kidneys are normal in size and position.  Multiple simple and parapelvic cysts are noted bilaterally. A slightly more complex, partially exophytic peripherally calcified cystic lesion measuring up to 1.8 cm is seen arising from the upper pole right kidney also previously characterized on prior  MR imaging is a complex though likely benign cyst. No new or concerning renal lesions. No frank hydronephrosis or obstructive urolithiasis. There is some mild faint perivesicular hazy stranding and some questionable layering debris versus asymmetric thickening of the left posterolateral bladder wall. Stomach/Bowel: Small sliding-type hiatal hernia. Proximal stomach is unremarkable. There is a circumferentially thickened appearance of the duodenal fold and gastric antrum. Mild mucosal hyperemia and possible ulceration (5/23) is present as well. More distal duodenum is unremarkable. No other small bowel thickening or dilatation. A normal appendix is visualized. No colonic dilatation or wall thickening. Scattered colonic diverticula without focal inflammation to suggest diverticulitis. Relative lack of formed stool could reflect a rapid transit state/diarrheal illness. Vascular/Lymphatic: Atherosclerotic calcification in the iliac arteries. Other significant vascular findings. No suspicious or enlarged lymph nodes in the included lymphatic chains. Reproductive: Lobular, heterogeneously enhancing lesions with uterus including both intramural and partially exophytic foci keeping with a diagnosis of uterine leiomyomata. No concerning adnexal lesions. Other: No abdominopelvic free fluid or free gas. No bowel containing hernias. Prior vertical midline incision. Mild circumferential body wall edema most pronounced posteriorly. Musculoskeletal: Multilevel degenerative changes are present in the imaged portions of the spine. More sclerotic changes about a region of disc desiccation at the T11-12 level is similar to prior and favored to be on a degenerative basis. Grade 1 anterolisthesis L4 on 5 without associated spondylolysis. 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 Additional moderate degenerative changes in the hips and pelvis. No acute or conspicuous osseous lesions.  IMPRESSION: 1. Circumferentially thickened appearance of the gastric antrum and duodenal bulb with some anemia mucosal hyperemia which could reflect some gastritis/duodenitis with a site of possible ulceration. No evidence of frank perforation or abscess. Correlate with endoscopy as clinically warranted. 2. Cholelithiasis with some slightly increasing pericholecystic inflammatory change when compared to prior imaging. If there is clinical concern for acute cholecystitis, recommend further evaluation with right upper quadrant ultrasound. 3. Relative lack of formed stool could reflect a rapid transit state/diarrheal illness. 4. Unchanged appearance of the multilobulated cystic lesion in the head of the pancreas with few punctate calcifications. Previously characterized on prior MR imaging 04/22/2020 with a differential including mucinous cystic neoplasm versus sequela of prior pancreatitis. 5. Minimally complex exophytic cystic lesion from the right kidney, previously characterized on MR imaging is a probable benign cyst. Additional fluid attenuation cysts in both kidneys. Acute urinary tract abnormality. 6. Fibroid uterus. 7. Aortic Atherosclerosis (ICD10-I70.0). Electronically Signed   By: Lovena Le M.D.   On: 09/24/2020 02:36   NM Hepato W/EjeCT Fract  Result Date: 09/18/2020 CLINICAL DATA:  Right quadrant pain and epigastric pain for 6 weeks. Postprandial nausea. Cholelithiasis. EXAM: NUCLEAR MEDICINE HEPATOBILIARY IMAGING WITH GALLBLADDER EF TECHNIQUE: Sequential images of the abdomen were obtained out to 60 minutes following intravenous administration of radiopharmaceutical. After oral ingestion of Ensure, gallbladder ejection fraction was determined. At 60 min, normal ejection fraction is greater than 33%. RADIOPHARMACEUTICALS:  5.3 mCi Tc-33m  Choletec IV COMPARISON:  None. FINDINGS: Prompt uptake and biliary excretion of activity by the liver is seen. Gallbladder activity is visualized, consistent with  patency of cystic duct. Biliary activity passes into small bowel, consistent with patent common bile duct. Calculated gallbladder ejection fraction is 47%. (Normal  gallbladder ejection fraction with Ensure is greater than 33%.) IMPRESSION: Normal hepatobiliary scan, demonstrating patency of cystic and common bile ducts. Normal gallbladder ejection fraction. Electronically Signed   By: Marlaine Hind M.D.   On: 09/18/2020 14:44   DG Chest Port 1 View  Result Date: 09/24/2020 CLINICAL DATA:  Shortness of breath and cough EXAM: PORTABLE CHEST 1 VIEW COMPARISON:  05/02/2020 FINDINGS: Cardiac shadow is stable. Lungs are well aerated bilaterally. No focal infiltrate or sizable effusion is seen. No bony abnormality is noted. IMPRESSION: No active disease. Electronically Signed   By: Inez Catalina M.D.   On: 09/24/2020 01:25   US ABDOMEN LIMITED RUQ (LIVER/GB)  Result Date: 09/24/2020 CLINICAL DATA:  Abdominal pain.  History of gallstones. EXAM: ULTRASOUND ABDOMEN LIMITED RIGHT UPPER QUADRANT COMPARISON:  Ultrasound 07/28/2020.  CT 07/28/2020. FINDINGS: Gallbladder: Gallstones again noted. Largest measures 1.6 cm. Gallbladder wall is minimally prominent at 3 mm. Trace pericholecystic fluid. Negative Murphy sign. Common bile duct: Diameter: 6 mm Liver: Increased echogenicity consistent fatty infiltration or hepatocellular disease. Portal vein is patent on color Doppler imaging with normal direction of blood flow towards the liver. Other: None. IMPRESSION: 1. Gallstones again noted. Largest measures 1.6 cm. Gallbladder wall is minimally prominent at 3 mm. Trace pericholecystic fluid. Cholecystitis cannot be excluded. Negative Murphy sign. No biliary distention. 2. Increased echogenicity of the liver consistent fatty infiltration or hepatocellular disease. 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 pylori serologies negative), returning this admission with recurrent abdominal pain, nausea/vomiting/diarrhea and CT findings as above in setting of known prior peptic ulcer disease.    Abdominal pain: CT abdomen pelvis with circumferential thickened appearance of the duodenal bulb and gastric antrum, mild mucosal enhancement and possible ulceration with no evidence of perforation or abscess.  Also with cholelithiasis with slight increasing pericholecystic inflammatory change.  Follow-up ultrasound with findings of cholelithiasis and mild pericholecystic fluid.  HIDA January 26 was normal.  Last EGD November 2021 with 2 antral scars in the stomach, 2 nonbleeding cratered gastric ulcers, noncritical narrowing of the angle of the duodenum.  H. pylori serologies negative.  She is due for repeat EGD in a few weeks for surveillance (not yet scheduled).  Finally discontinued NSAIDs on January 13.  Patient has been seen by general surgery who does not feel her symptoms are related to gallbladder.  It is suspected that she has pain related to persistent peptic ulcer disease.  Spoke with nursing staff this morning.  Patient last vomited after lunch the day before.  She has not required any pain medication.  Per nursing staff, patient sleeping most of the time.Breakfast has not been served at this time.  Diarrhea: Chronic diarrhea since September.  Worsening for 2 days prior to admission.  Worse with stress.  Previously did well on dicyclomine.  Patient tested positive for enteropathogenic E. coli in November.  No prior colonoscopy.  Patient may have underlying IBS or postinfectious IBS.  1 stool in the last 24 hours.  Stool studies can be ordered if she has  recurrent diarrhea.  Normocytic anemia: Hemoglobin typically in the 10 range since November.  11 on admission.  Stable at 9.5 last 24 hours.  No overt GI bleeding.   Plan:  1. Continue PPI twice daily 2. Outpatient EGD for surveillance for ulcer healing. 3. Outpatient MRI for pancreatic lesion in 4 to 6 months. 4. Follow-up gastrin level is available.  Laureen Ochs. Bernarda Caffey [facility] [phone] 2/3/20224:20 PM     LOS: 1 day

## 2020-09-27 ENCOUNTER — Telehealth: Payer: Self-pay | Admitting: Nurse Practitioner

## 2020-09-27 DIAGNOSIS — K529 Noninfective gastroenteritis and colitis, unspecified: Secondary | ICD-10-CM | POA: Diagnosis not present

## 2020-09-27 DIAGNOSIS — K219 Gastro-esophageal reflux disease without esophagitis: Secondary | ICD-10-CM | POA: Diagnosis not present

## 2020-09-27 DIAGNOSIS — R112 Nausea with vomiting, unspecified: Secondary | ICD-10-CM | POA: Diagnosis not present

## 2020-09-27 DIAGNOSIS — R197 Diarrhea, unspecified: Secondary | ICD-10-CM | POA: Diagnosis not present

## 2020-09-27 MED ORDER — ACETAMINOPHEN 500 MG PO TABS: 500.0000 mg | ORAL_TABLET | Freq: Four times a day (QID) | ORAL | Status: AC | PRN

## 2020-09-27 NOTE — TOC Transition Note (Signed)
Transition of Care [facility]) - CM/SW Discharge Note   Patient Details  Name: Modean Mccullum MRN: [id number] Date of Birth: 12-03-1955  [facility] CM/SW Contact:  Natasha Bence, LCSW Phone Number: 09/27/2020, 11:55 AM   Clinical Narrative:    CSW contacted [facility], [facility] for HHPT. [facility] agencies not able to provide [facility] services for patient. CSW referred patient for OPPT with [facility]. TOC signing off.   Final next level of care: OP Rehab Barriers to Discharge: Barriers Resolved   Patient Goals and CMS Choice Patient states their goals for this hospitalization and ongoing recovery are:: rehab with OPPT CMS Medicare.gov Compare Post Acute Care list provided to:: Patient Choice offered to / list presented to : Patient  Discharge Placement                    Patient and family notified of of transfer: 09/27/20  Discharge Plan and Services In-house Referral: Clinical Social Work Discharge Planning Services: NA Post Acute Care Choice: NA          DME Arranged: N/A DME Agency: NA       HH Arranged: NA HH Agency: NA        Social Determinants of Health (SDOH) Interventions     Readmission Risk Interventions Readmission Risk Prevention Plan 09/27/2020 09/26/2020 05/17/2020  Medication Screening - - -  Transportation Screening Complete Complete Complete  PCP or Specialist Appt within 5-7 Days - - Complete  Home Care Screening - - Complete  Medication Review (RN CM) - - Complete  Medication Review Press photographer) Complete Complete -  PCP or Specialist appointment within 3-5 days of discharge - Complete -  [place] or [place] - Complete -  SW Recovery Care/Counseling Consult - Complete -  Palliative Care Screening - Not Applicable -  [facility] - Not Applicable -  Some recent data might be hidden

## 2020-09-27 NOTE — Discharge Summary (Signed)
Physician Discharge Summary  Maria Murphy [id number] DOB: 11-05-1955 DOA: 09/23/2020  PCP: Ladell Pier, MD  Admit date: 09/23/2020 Discharge date: 09/27/2020  Admitted From:  Home  Disposition: Home   Recommendations for Outpatient Follow-up:  1. Follow up with PCP in 1 weeks 2. Follow up with [facility] as scheduled 3. Outpatient EGD and MRI arranged thru [facility]   Discharge Condition: STABLE   CODE STATUS: FULL    Brief Hospitalization Summary: Please see all hospital notes, images, labs for full details of the hospitalization. ADMISSION HPI: Maria Murphy is a 65 y.o. 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  She normally has chronic diarrhea with 3-4 episodes of bowel movements a day, but on Sunday noticed approximately 20 episodes of watery diarrhea as well as some nausea and vomiting that was quite significant.  She describes a new right upper quadrant abdominal pain that is cramping in nature with no radiation.  She states that eating seems to make the pain and symptoms worse.  She denies any recent change in her diet or sick contacts.  She has completed Covid vaccination, but has not received a booster.  She denies any cough, shortness of breath, chest pain, or congestion.  She has been taking her home medications to include Xarelto as prescribed for prior PEs. She has had a recent HIDA scan on 1/26 that was negative for any acute findings.   ED Course: Vital signs stable patient is afebrile.  Laboratory data remarkable for hemoglobin of 11, CT abdomen with findings of gastritis and duodenitis as well as potential cholelithiasis.  Follow-up abdominal ultrasound with findings of cholelithiasis and mild pericholecystic fluid, but otherwise insignificant.  Chest x-ray with no acute  findings.  Covid testing pending.  No leukocytosis or LFT elevation noted.    Hospital Course   1. Intractable nausea and vomiting - RESOLVED.  Pt has tolerated her full liquid and soft diet now and stable to discharge home with outpatient follow up with [facility].    Continue PPI twice daily and carafate.  2. PUD with history of duodenal ulceration - Continue BID PPI therapy and carafate.  3. Essential hypertension - resume home meds.    4. History of PE/ acquired thrombophilia - resume home rivaroxaban.  5. GERD - BID protonix ordered.  Carafate ordered.   DVT prophylaxis: SCDs Code Status: Full  Disposition: Home  Status is: Inpatient   Discharge Diagnoses:  Active Problems:   Acute gastroenteritis   Diarrhea   Nausea vomiting and diarrhea   Intractable nausea and vomiting   Discharge Instructions:  Allergies as of 09/27/2020      Reactions   Wheat Extract Swelling      Medication List    STOP taking these medications   dicyclomine 10 MG capsule Commonly known as: BENTYL     TAKE these medications   acetaminophen 500 MG tablet Commonly known as: TYLENOL Take 1 tablet (500 mg total) by mouth every 6 (six) hours as needed for mild pain or fever.   citalopram 20 MG tablet Commonly known as: CELEXA Take 1 tablet (20 mg total) by mouth daily.   diclofenac Sodium 1 % Gel Commonly known as: Voltaren Apply 2 g topically 4 (four) times daily.   donepezil 10 MG tablet Commonly known as: ARICEPT Take 0.5-1 tablets (5-10  mg total) by mouth in the morning and at bedtime.   gabapentin 300 MG capsule Commonly known as: NEURONTIN Take 300 mg by mouth 2 (two) times daily.   lisinopril-hydrochlorothiazide 10-12.5 MG tablet Commonly known as: ZESTORETIC Take 1 tablet by mouth daily.   ondansetron 8 MG disintegrating tablet Commonly known as: Zofran ODT Take 1 tablet (8 mg total) by mouth every 8 (eight) hours as needed for nausea or vomiting.   pantoprazole 40 MG  tablet Commonly known as: PROTONIX Take 1 tablet (40 mg total) by mouth 2 (two) times daily.   rivaroxaban 20 MG Tabs tablet Commonly known as: XARELTO Take 1 tablet (20 mg total) by mouth daily with supper. To start after initial loading dose pack.   sucralfate 1 GM/10ML suspension Commonly known as: CARAFATE Take 10 mLs (1 g total) by mouth 4 (four) times daily -  with meals and at bedtime.   traZODone 100 MG tablet Commonly known as: DESYREL Take 200 mg by mouth at bedtime.       Follow-up Information    Ladell Pier, MD. Schedule an appointment as soon as possible for a visit in 1 week(s).   Specialty: Internal Medicine Contact information: [address] [place] [address] [phone]              Allergies  Allergen Reactions  . Wheat Extract Swelling   Allergies as of 09/27/2020      Reactions   Wheat Extract Swelling      Medication List    STOP taking these medications   dicyclomine 10 MG capsule Commonly known as: BENTYL     TAKE these medications   acetaminophen 500 MG tablet Commonly known as: TYLENOL Take 1 tablet (500 mg total) by mouth every 6 (six) hours as needed for mild pain or fever.   citalopram 20 MG tablet Commonly known as: CELEXA Take 1 tablet (20 mg total) by mouth daily.   diclofenac Sodium 1 % Gel Commonly known as: Voltaren Apply 2 g topically 4 (four) times daily.   donepezil 10 MG tablet Commonly known as: ARICEPT Take 0.5-1 tablets (5-10 mg total) by mouth in the morning and at bedtime.   gabapentin 300 MG capsule Commonly known as: NEURONTIN Take 300 mg by mouth 2 (two) times daily.   lisinopril-hydrochlorothiazide 10-12.5 MG tablet Commonly known as: ZESTORETIC Take 1 tablet by mouth daily.   ondansetron 8 MG disintegrating tablet Commonly known as: Zofran ODT Take 1 tablet (8 mg total) by mouth every 8 (eight) hours as needed for nausea or vomiting.   pantoprazole 40 MG tablet Commonly known  as: PROTONIX Take 1 tablet (40 mg total) by mouth 2 (two) times daily.   rivaroxaban 20 MG Tabs tablet Commonly known as: XARELTO Take 1 tablet (20 mg total) by mouth daily with supper. To start after initial loading dose pack.   sucralfate 1 GM/10ML suspension Commonly known as: CARAFATE Take 10 mLs (1 g total) by mouth 4 (four) times daily -  with meals and at bedtime.   traZODone 100 MG tablet Commonly known as: DESYREL Take 200 mg by mouth at bedtime.       Procedures/Studies: CT Abdomen Pelvis W Contrast  Result Date: 09/24/2020 CLINICAL DATA:  Nausea, vomiting and diarrhea for 2 days EXAM: CT ABDOMEN AND PELVIS WITH CONTRAST TECHNIQUE: Multidetector CT imaging of the abdomen and pelvis was performed using the standard protocol following bolus administration of intravenous contrast. CONTRAST:  128mL OMNIPAQUE IOHEXOL 300  MG/ML  SOLN COMPARISON:  CT 08/07/2020 04/22/2020 FINDINGS: Lower chest: Atelectatic changes in the wrist clear lung bases. Cardiac size is top normal. No pericardial effusion. Few coronary artery calcifications. Hepatobiliary: Few subcentimeter hypoattenuating foci in the liver too small to fully characterize on CT imaging but statistically likely benign. No concerning focal liver lesions. Smooth liver surface contour. Normal liver attenuation. Gallbladder contains multiple partially calcified gallstones several of which appear partially pneumatized as well. There is some increasing pericholecystic inflammatory change when compared to prior imaging. No visible intraductal gallstones or biliary ductal dilatation is seen however. Pancreas: Redemonstration of the multilobulated cystic lesion in the head of the pancreas with few punctate calcifications again measuring up to 3.5 by 4.0 cm in maximum transaxial dimension, not significantly changed from comparison studies. Previously characterized on prior MR imaging 04/22/2020 with a differential including mucinous cystic  neoplasm versus sequela of prior pancreatitis. Remaining portions of the pancreas are unremarkable without new or concerning pancreatic lesion, ductal dilatation, or peripancreatic inflammation. Spleen: Normal in size. No concerning splenic lesions. Adrenals/Urinary Tract: Normal adrenal glands. Kidneys are normal in size and position. Multiple simple and parapelvic cysts are noted bilaterally. A slightly more complex, partially exophytic peripherally calcified cystic lesion measuring up to 1.8 cm is seen arising from the upper pole right kidney also previously characterized on prior MR imaging is a complex though likely benign cyst. No new or concerning renal lesions. No frank hydronephrosis or obstructive urolithiasis. There is some mild faint perivesicular hazy stranding and some questionable layering debris versus asymmetric thickening of the left posterolateral bladder wall. Stomach/Bowel: Small sliding-type hiatal hernia. Proximal stomach is unremarkable. There is a circumferentially thickened appearance of the duodenal fold and gastric antrum. Mild mucosal hyperemia and possible ulceration (5/23) is present as well. More distal duodenum is unremarkable. No other small bowel thickening or dilatation. A normal appendix is visualized. No colonic dilatation or wall thickening. Scattered colonic diverticula without focal inflammation to suggest diverticulitis. Relative lack of formed stool could reflect a rapid transit state/diarrheal illness. Vascular/Lymphatic: Atherosclerotic calcification in the iliac arteries. Other significant vascular findings. No suspicious or enlarged lymph nodes in the included lymphatic chains. Reproductive: Lobular, heterogeneously enhancing lesions with uterus including both intramural and partially exophytic foci keeping with a diagnosis of uterine leiomyomata. No concerning adnexal lesions. Other: No abdominopelvic free fluid or free gas. No bowel containing hernias. Prior vertical  midline incision. Mild circumferential body wall edema most pronounced posteriorly. Musculoskeletal: Multilevel degenerative changes are present in the imaged portions of the spine. More sclerotic changes about a region of disc desiccation at the T11-12 level is similar to prior and favored to be on a degenerative basis. Grade 1 anterolisthesis L4 on 5 without associated spondylolysis. 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 Additional moderate degenerative changes in the hips and pelvis. No acute or conspicuous osseous lesions. IMPRESSION: 1. Circumferentially thickened appearance of the gastric antrum and duodenal bulb with some anemia mucosal hyperemia which could reflect some gastritis/duodenitis with a site of possible ulceration. No evidence of frank perforation or abscess. Correlate with endoscopy as clinically warranted. 2. Cholelithiasis with some slightly increasing pericholecystic inflammatory change when compared to prior imaging. If there is clinical concern for acute cholecystitis, recommend further evaluation with right upper quadrant ultrasound. 3. Relative lack of formed stool could reflect a rapid transit state/diarrheal illness. 4. Unchanged appearance of the multilobulated cystic lesion in the head of the pancreas with few punctate calcifications. Previously characterized on  prior MR imaging 04/22/2020 with a differential including mucinous cystic neoplasm versus sequela of prior pancreatitis. 5. Minimally complex exophytic cystic lesion from the right kidney, previously characterized on MR imaging is a probable benign cyst. Additional fluid attenuation cysts in both kidneys. Acute urinary tract abnormality. 6. Fibroid uterus. 7. Aortic Atherosclerosis (ICD10-I70.0). Electronically Signed   By: Lovena Le M.D.   On: 09/24/2020 02:36   NM Hepato W/EjeCT Fract  Result Date: 09/18/2020 CLINICAL DATA:  Right quadrant pain and epigastric pain for 6  weeks. Postprandial nausea. Cholelithiasis. EXAM: NUCLEAR MEDICINE HEPATOBILIARY IMAGING WITH GALLBLADDER EF TECHNIQUE: Sequential images of the abdomen were obtained out to 60 minutes following intravenous administration of radiopharmaceutical. After oral ingestion of Ensure, gallbladder ejection fraction was determined. At 60 min, normal ejection fraction is greater than 33%. RADIOPHARMACEUTICALS:  5.3 mCi Tc-5m  Choletec IV COMPARISON:  None. FINDINGS: Prompt uptake and biliary excretion of activity by the liver is seen. Gallbladder activity is visualized, consistent with patency of cystic duct. Biliary activity passes into small bowel, consistent with patent common bile duct. Calculated gallbladder ejection fraction is 47%. (Normal gallbladder ejection fraction with Ensure is greater than 33%.) IMPRESSION: Normal hepatobiliary scan, demonstrating patency of cystic and common bile ducts. Normal gallbladder ejection fraction. Electronically Signed   By: Marlaine Hind M.D.   On: 09/18/2020 14:44   DG Chest Port 1 View  Result Date: 09/24/2020 CLINICAL DATA:  Shortness of breath and cough EXAM: PORTABLE CHEST 1 VIEW COMPARISON:  05/02/2020 FINDINGS: Cardiac shadow is stable. Lungs are well aerated bilaterally. No focal infiltrate or sizable effusion is seen. No bony abnormality is noted. IMPRESSION: No active disease. Electronically Signed   By: Inez Catalina M.D.   On: 09/24/2020 01:25   US ABDOMEN LIMITED RUQ (LIVER/GB)  Result Date: 09/24/2020 CLINICAL DATA:  Abdominal pain.  History of gallstones. EXAM: ULTRASOUND ABDOMEN LIMITED RIGHT UPPER QUADRANT COMPARISON:  Ultrasound 07/28/2020.  CT 07/28/2020. FINDINGS: Gallbladder: Gallstones again noted. Largest measures 1.6 cm. Gallbladder wall is minimally prominent at 3 mm. Trace pericholecystic fluid. Negative Murphy sign. Common bile duct: Diameter: 6 mm Liver: Increased echogenicity consistent fatty infiltration or hepatocellular disease. Portal vein is  patent on color Doppler imaging with normal direction of blood flow towards the liver. Other: None. IMPRESSION: 1. Gallstones again noted. Largest measures 1.6 cm. Gallbladder wall is minimally prominent at 3 mm. Trace pericholecystic fluid. Cholecystitis cannot be excluded. Negative Murphy sign. No biliary distention. 2. Increased echogenicity of the liver consistent fatty infiltration or hepatocellular disease. Electronically Signed   By: Marcello Moores  Register   On: 09/24/2020 09:15      Subjective: Pt tolerating meals much better now.  Pt agreeable to going home and will follow up as recommended.    Discharge Exam: Vitals:   09/27/20 0632 09/27/20 0744  BP: (!) 151/78 140/72  Pulse: (!) 51 (!) 56  Resp: 16 14  Temp: 97.9 F (36.6 C) 97.8 F (36.6 C)  SpO2: 96% 94%   Vitals:   09/26/20 1937 09/27/20 0246 09/27/20 0632 09/27/20 0744  BP: (!) 155/86 (!) 147/81 (!) 151/78 140/72  Pulse: (!) 56 (!) 54 (!) 51 (!) 56  Resp: 18 16 16 14   Temp: 98.7 F (37.1 C) 98.3 F (36.8 C) 97.9 F (36.6 C) 97.8 F (36.6 C)  TempSrc: Oral Oral Oral Oral  SpO2:  95% 96% 94%  Weight:      Height:       General: Pt is alert, awake, not in  acute distress Cardiovascular: normal S1/S2 +, no rubs, no gallops Respiratory: CTA bilaterally, no wheezing, no rhonchi Abdominal: Soft, NT, ND, bowel sounds + Extremities: no edema, no cyanosis   The results of significant diagnostics from this hospitalization (including imaging, microbiology, ancillary and laboratory) are listed below for reference.     Microbiology: Recent Results (from the past 240 hour(s))  SARS Coronavirus 2 by RT PCR (hospital order, performed in [facility] hospital lab) Nasopharyngeal Nasopharyngeal Swab     Status: None   Collection Time: 09/24/20  7:22 AM   Specimen: Nasopharyngeal Swab  Result Value Ref Range Status   SARS Coronavirus 2 NEGATIVE NEGATIVE Final    Comment: (NOTE) SARS-CoV-2 target nucleic acids are NOT  DETECTED.  The SARS-CoV-2 RNA is generally detectable in upper and lower respiratory specimens during the acute phase of infection. The lowest concentration of SARS-CoV-2 viral copies this assay can detect is 250 copies / mL. A negative result does not preclude SARS-CoV-2 infection and should not be used as the sole basis for treatment or other patient management decisions.  A negative result may occur with improper specimen collection / handling, submission of specimen other than nasopharyngeal swab, presence of viral mutation(s) within the areas targeted by this assay, and inadequate number of viral copies (<250 copies / mL). A negative result must be combined with clinical observations, patient history, and epidemiological information.  Fact Sheet for Patients:   StrictlyIdeas.no  Fact Sheet for Healthcare Providers: BankingDealers.co.za  This test is not yet approved or  cleared by the [place] FDA and has been authorized for detection and/or diagnosis of SARS-CoV-2 by FDA under an Emergency Use Authorization (EUA).  This EUA will remain in effect (meaning this test can be used) for the duration of the COVID-19 declaration under Section 564(b)(1) of the Act, 21 U.S.C. section 360bbb-3(b)(1), unless the authorization is terminated or revoked sooner.  Performed at [facility], [address], [place], [place] [address]      Labs: BNP (last 3 results) No results for input(s): BNP in the last 8760 hours. Basic Metabolic Panel: Recent Labs  Lab 09/24/20 0100 09/25/20 0713 09/26/20 0733  NA 136 139 139  K 3.7 3.4* 4.0  CL 105 110 112*  CO2 23 24 22   GLUCOSE 101* 92 88  BUN 14 8 7*  CREATININE 0.75 0.66 0.72  CALCIUM 9.0 8.7* 8.4*  MG  --  1.7 1.9   Liver Function Tests: Recent Labs  Lab 09/24/20 0100 09/25/20 0713 09/26/20 0733  AST 20 13* 15  ALT 10 7 9   ALKPHOS 45 37* 35*  BILITOT 0.6 0.4 0.4  PROT 7.1  5.7* 5.2*  ALBUMIN 4.2 3.1* 3.0*   Recent Labs  Lab 09/24/20 0100  LIPASE 33   No results for input(s): AMMONIA in the last 168 hours. CBC: Recent Labs  Lab 09/24/20 0100 09/25/20 0713 09/26/20 0733  WBC 6.5 3.5* 3.4*  NEUTROABS 5.0  --   --   HGB 11.0* 9.7* 9.5*  HCT 33.5* 30.6* 30.4*  MCV 85.5 86.7 88.4  PLT 195 153 150   Cardiac Enzymes: No results for input(s): CKTOTAL, CKMB, CKMBINDEX, TROPONINI in the last 168 hours. BNP: Invalid input(s): POCBNP CBG: No results for input(s): GLUCAP in the last 168 hours. D-Dimer No results for input(s): DDIMER in the last 72 hours. Hgb A1c No results for input(s): HGBA1C in the last 72 hours. Lipid Profile No results for input(s): CHOL, HDL, LDLCALC, TRIG, CHOLHDL, LDLDIRECT in the  last 72 hours. Thyroid function studies No results for input(s): TSH, T4TOTAL, T3FREE, THYROIDAB in the last 72 hours.  Invalid input(s): FREET3 Anemia work up No results for input(s): VITAMINB12, FOLATE, FERRITIN, TIBC, IRON, RETICCTPCT in the last 72 hours. Urinalysis    Component Value Date/Time   COLORURINE YELLOW 09/24/2020 0423   APPEARANCEUR CLEAR 09/24/2020 0423   LABSPEC 1.044 (H) 09/24/2020 0423   PHURINE 6.0 09/24/2020 0423   GLUCOSEU NEGATIVE 09/24/2020 0423   HGBUR NEGATIVE 09/24/2020 0423   BILIRUBINUR NEGATIVE 09/24/2020 0423   KETONESUR 20 (A) 09/24/2020 0423   PROTEINUR NEGATIVE 09/24/2020 0423   NITRITE NEGATIVE 09/24/2020 0423   LEUKOCYTESUR TRACE (A) 09/24/2020 0423   Sepsis Labs Invalid input(s): PROCALCITONIN,  WBC,  LACTICIDVEN Microbiology Recent Results (from the past 240 hour(s))  SARS Coronavirus 2 by RT PCR (hospital order, performed in [facility] hospital lab) Nasopharyngeal Nasopharyngeal Swab     Status: None   Collection Time: 09/24/20  7:22 AM   Specimen: Nasopharyngeal Swab  Result Value Ref Range Status   SARS Coronavirus 2 NEGATIVE NEGATIVE Final    Comment: (NOTE) SARS-CoV-2 target nucleic acids  are NOT DETECTED.  The SARS-CoV-2 RNA is generally detectable in upper and lower respiratory specimens during the acute phase of infection. The lowest concentration of SARS-CoV-2 viral copies this assay can detect is 250 copies / mL. A negative result does not preclude SARS-CoV-2 infection and should not be used as the sole basis for treatment or other patient management decisions.  A negative result may occur with improper specimen collection / handling, submission of specimen other than nasopharyngeal swab, presence of viral mutation(s) within the areas targeted by this assay, and inadequate number of viral copies (<250 copies / mL). A negative result must be combined with clinical observations, patient history, and epidemiological information.  Fact Sheet for Patients:   StrictlyIdeas.no  Fact Sheet for Healthcare Providers: BankingDealers.co.za  This test is not yet approved or  cleared by the [place] FDA and has been authorized for detection and/or diagnosis of SARS-CoV-2 by FDA under an Emergency Use Authorization (EUA).  This EUA will remain in effect (meaning this test can be used) for the duration of the COVID-19 declaration under Section 564(b)(1) of the Act, 21 U.S.C. section 360bbb-3(b)(1), unless the authorization is terminated or revoked sooner.  Performed at [facility], [address] [place], [place] [address]    Time coordinating discharge: 36 mins   SIGNED:  Irwin Brakeman, MD  [facility] 09/27/2020, 10:41 AM How to contact the [place] Attending or Consulting provider [place] or covering provider during after hours [place], for this patient?  1. Check the care team in [facility] and look for a) attending/consulting [facility] provider listed and b) the [facility] team listed 2. Log into [email or web address] and use [place]'s universal password to access. If you do not have the password, please contact the hospital  operator. 3. Locate the [facility] provider you are looking for under [facility] and page to a number that you can be directly reached. 4. If you still have difficulty reaching the provider, please page the [facility] (Director on Call) for the Hospitalists listed on amion for assistance.

## 2020-09-27 NOTE — Progress Notes (Signed)
Patient denies any nausea or pain in abdomen at shift assessment. Reviewed that patient may have soft diet if she prefers. Patient verbalized she would like to have food this am.

## 2020-09-27 NOTE — Telephone Encounter (Signed)
Patient being d/c today  Already has appropriate office follow-up; Please add "Hosp fu, schedule EGD" to cc/reason for visit.  Please NIC for repeat MRI 02/21/21  Thanks!  Randall Hiss

## 2020-09-27 NOTE — Progress Notes (Signed)
Briefly saw the patient this morning. States she's feeling much better. Per Dr. Wynetta Emery (hospitalist) plans to d/c her today. She should keep her already scheduled outpatient OV with [facility] on 11/02/20 for hospital follow-up and to schedule EGD.  GI recommendations: 1. Continue PPI bid at discharge 2. Plans for outpatient EGD surveillance for ulcer healing 3. Plans for outpatient MRI pancreatic lesion 4-6 months

## 2020-09-27 NOTE — Discharge Instructions (Signed)
IMPORTANT INFORMATION: PAY CLOSE ATTENTION    PHYSICIAN DISCHARGE INSTRUCTIONS  Follow with Primary care provider  Ladell Pier, MD  and other consultants as instructed by your Hospitalist Physician  [place] IF SYMPTOMS COME BACK, WORSEN OR NEW PROBLEM DEVELOPS   Please note: You were cared for by a hospitalist during your hospital stay. Every effort will be made to forward records to your primary care provider.  You can request that your primary care provider send for your hospital records if they have not received them.  Once you are discharged, your primary care physician will handle any further medical issues. Please note that NO REFILLS for any discharge medications will be authorized once you are discharged, as it is imperative that you return to your primary care physician (or establish a relationship with a primary care physician if you do not have one) for your post hospital discharge needs so that they can reassess your need for medications and monitor your lab values.  Please get a complete blood count and chemistry panel checked by your Primary MD at your next visit, and again as instructed by your Primary MD.  Get Medicines reviewed and adjusted: Please take all your medications with you for your next visit with your Primary MD  Laboratory/radiological data: Please request your Primary MD to go over all hospital tests and procedure/radiological results at the follow up, please ask your primary care provider to get all Hospital records sent to his/her office.  In some cases, they will be blood work, cultures and biopsy results pending at the time of your discharge. Please request that your primary care provider follow up on these results.  If you are diabetic, please bring your blood sugar readings with you to your follow up appointment with primary care.    Please call and make your follow up appointments as soon as possible.    Also  Note the following: If you experience worsening of your admission symptoms, develop shortness of breath, life threatening emergency, suicidal or homicidal thoughts you must seek medical attention immediately by calling 911 or calling your MD immediately  if symptoms less severe.  You must read complete instructions/literature along with all the possible adverse reactions/side effects for all the Medicines you take and that have been prescribed to you. Take any new Medicines after you have completely understood and accpet all the possible adverse reactions/side effects.   Do not drive when taking Pain medications or sleeping medications (Benzodiazepines)  Do not take more than prescribed Pain, Sleep and Anxiety Medications. It is not advisable to combine anxiety,sleep and pain medications without talking with your primary care practitioner  Special Instructions: If you have smoked or chewed Tobacco  in the last 2 yrs please stop smoking, stop any regular Alcohol  and or any Recreational drug use.  Wear Seat belts while driving.  Do not drive if taking any narcotic, mind altering or controlled substances or recreational drugs or alcohol.     Soft-Food Eating Plan A soft-food eating plan includes foods that are safe and easy to chew and swallow. Your health care provider or dietitian can help you find foods and flavors that fit into this plan. Follow this plan until your health care provider or dietitian says it is safe to start eating other foods and food textures. What are tips for following this plan? General guidelines  Take small bites of food, or cut food into pieces about  inch or  smaller. Bite-sized pieces of food are easier to chew and swallow.  Eat moist foods. Avoid overly dry foods.  Avoid foods that: ? Are difficult to swallow, such as dry, chunky, crispy, or sticky foods. ? Are difficult to chew, such as hard, tough, or stringy foods. ? Contain nuts, seeds, or fruits.  Follow  instructions from your dietitian about the types of liquids that are safe for you to swallow. You may be allowed to have: ? Thick liquids only. This includes only liquids that are thicker than honey. ? Thin and thick liquids. This includes all beverages and foods that become liquid at room temperature.  To make thick liquids: ? Purchase a commercial liquid thickening powder. These are available at grocery stores and pharmacies. ? Mix the thickener into liquids according to instructions on the label. ? Purchase ready-made thickened liquids. ? Thicken soup by pureeing, straining to remove chunks, and adding flour, potato flakes, or corn starch. ? Add commercial thickener to foods that become liquid at room temperature, such as milk shakes, yogurt, ice cream, gelatin, and sherbet.  Ask your health care provider whether you need to take a fiber supplement.   Cooking  Cook meats so they stay tender and moist. Use methods like braising, stewing, or baking in liquid.  Cook vegetables and fruit until they are soft enough to be mashed with a fork.  Peel soft, fresh fruits such as peaches, nectarines, and melons.  When making soup, make sure chunks of meat and vegetables are smaller than  inch.  Reheat leftover foods slowly so that a tough crust does not form. What foods are allowed? The items listed below may not be a complete list. Talk with your dietitian about what dietary choices are best for you. Grains Breads, muffins, pancakes, or waffles moistened with syrup, jelly, or butter. Dry cereals well-moistened with milk. Moist, cooked cereals. Well-cooked pasta and rice. Vegetables All soft-cooked vegetables. Shredded lettuce. Fruits All canned and cooked fruits. Soft, peeled fresh fruits. Strawberries. Dairy Milk. Cream. Yogurt. Cottage cheese. Soft cheese without the rind. Meats and other protein foods Tender, moist ground meat, poultry, or fish. Meat cooked in gravy or sauces.  Eggs. Sweets and desserts Ice cream. Milk shakes. Sherbet. Pudding. Fats and oils Butter. Margarine. Olive, canola, sunflower, and grapeseed oil. Smooth salad dressing. Smooth cream cheese. Mayonnaise. Gravy. What foods are not allowed? The items listed bemay not be a complete list. Talk with your dietitian about what dietary choices are best for you. Grains Coarse or dry cereals, such as bran, granola, and shredded wheat. Tough or chewy crusty breads, such as [place] bread or baguettes. Breads with nuts, seeds, or fruit. Vegetables All raw vegetables. Cooked corn. Cooked vegetables that are tough or stringy. Tough, crisp, fried potatoes and potato skins. Fruits Fresh fruits with skins or seeds, or both, such as apples, pears, and grapes. Stringy, high-pulp fruits, such as papaya, pineapple, coconut, and mango. Fruit leather and all dried fruit. Dairy Yogurt with nuts or coconut. Meats and other protein foods Hard, dry sausages. Dry meat, poultry, or fish. Meats with gristle. Fish with bones. Fried meat or fish. Lunch meat and hotdogs. Nuts and seeds. Chunky peanut butter or other nut butters. Sweets and desserts Cakes or cookies that are very dry or chewy. Desserts with dried fruit, nuts, or coconut. Fried pastries. Very rich pastries. Fats and oils Cream cheese with fruit or nuts. Salad dressings with seeds or chunks. Summary  A soft-food eating plan includes foods that are  safe and easy to swallow. Generally, the foods should be soft enough to be mashed with a fork.  Avoid foods that are dry, hard to chew, crunchy, sticky, stringy, or crispy.  Ask your health care provider whether you need to thicken your liquids and if you need to take a fiber supplement. This information is not intended to replace advice given to you by your health care provider. Make sure you discuss any questions you have with your health care provider. Document Revised: 12/01/2018 Document Reviewed:  10/13/2016 Elsevier Patient Education  2021 [employer].

## 2020-09-27 NOTE — OR Nursing (Signed)
Pt denies any further needs at this time.

## 2020-09-27 NOTE — Progress Notes (Signed)
Patient "ready to go home". Advised patient that we are waiting for social worker to visit with patient regarding Home health assistance.

## 2020-09-30 ENCOUNTER — Telehealth: Payer: Self-pay

## 2020-09-30 NOTE — Telephone Encounter (Signed)
Transition Care Management Unsuccessful Follow-up Telephone Call  Date of discharge and from where:  09/27/2020, [name]  Attempts:  1st Attempt  Reason for unsuccessful TCM follow-up call:  Unable to leave message - voicemail not set up.  Call placed to # [phone].  Patient has appointment with Dr Wynetta Emery 10/07/2020.

## 2020-09-30 NOTE — Telephone Encounter (Signed)
Transition Care Management Follow-up Telephone Call  Patient called the clinic. Informed her that her voicemail is not set up on her phone.  Date of discharge and from where: 09/27/2020, [name]  How have you been since you were released from the hospital? She said she is " okay."   Any questions or concerns? Yes - she said she " worries about doing things."  She could not further explain her concerns.  She said that she a peer support specialist in [place] and the agency that provides this support is " [employer]."  She did not know the name of an agency, only  "[employer]."  She was interested in speaking with Christa See, LCSW and informed her that this CM would ask that [facility] call her when she has a chance.   Items Reviewed:  Did the pt receive and understand the discharge instructions provided? Yes   Medications obtained and verified? Yes  - she said she has all medications and did not have any questions about her med regime  Other? No   Any new allergies since your discharge? No   Do you have support at home? she lives alone and said that her sister comes weekly to check on her.   Home Care and Equipment/Supplies: Were home health services ordered? no If so, what is the name of the agency? n/a  Has the agency set up a time to come to the patient's home? n/a Were any new equipment or medical supplies ordered?  No What is the name of the medical supply agency? n/a Were you able to get the supplies/equipment?n/a Do you have any questions related to the use of the equipment or supplies? No, n/a  She has a walker and cane to use when needed.   Functional Questionnaire: (I = Independent and D = Dependent) ADLs: independent.  She said that she needs assistance at home with some housekeeping. She reports bilateral knee pain.   Follow up appointments reviewed:   PCP Hospital f/u appt confirmed? Yes  - Dr Wynetta Emery  10/07/2020  [facility] f/u appt confirmed? Yes  - GI  11/06/2020.   Are transportation arrangements needed? No  - she said that her peer support person can provide transportation and if not, she will ask her sister   If their condition worsens, is the pt aware to call PCP or go to the Emergency Dept.? Yes  Was the patient provided with contact information for the PCP's office or ED? Yes, she has the phone number for [facility]  Was to pt encouraged to call back with questions or concerns? Yes

## 2020-09-30 NOTE — Telephone Encounter (Signed)
Done

## 2020-10-02 ENCOUNTER — Telehealth: Payer: Self-pay | Admitting: Licensed Clinical Social Worker

## 2020-10-02 NOTE — Telephone Encounter (Signed)
Call placed to patient to inquire about any behavioral health and/or resource needs. There was no answer and pt's voicemail is not set-up; therefore LCSW was unable to leave a return message.

## 2020-10-02 NOTE — Telephone Encounter (Signed)
Return call received from patient. LCSW introduced self and explained role at [facility]. Pt shared interest in meeting with LCSW on Monday, February 14, 22 during appointment with PCP. Pt agreed to inform [employer], in addition, to [place] upon arrival for appt.

## 2020-10-07 ENCOUNTER — Ambulatory Visit: Payer: Medicaid Other | Admitting: Internal Medicine

## 2020-10-08 ENCOUNTER — Encounter: Payer: Self-pay | Admitting: Internal Medicine

## 2020-10-08 ENCOUNTER — Ambulatory Visit: Payer: Medicaid Other | Attending: Internal Medicine | Admitting: Internal Medicine

## 2020-10-08 ENCOUNTER — Ambulatory Visit (HOSPITAL_COMMUNITY): Payer: Medicaid Other | Attending: Family Medicine | Admitting: Physical Therapy

## 2020-10-08 ENCOUNTER — Encounter (HOSPITAL_COMMUNITY): Payer: Self-pay | Admitting: Physical Therapy

## 2020-10-08 ENCOUNTER — Other Ambulatory Visit: Payer: Self-pay

## 2020-10-08 VITALS — BP 148/93 | HR 65 | Resp 16 | Wt 192.0 lb

## 2020-10-08 DIAGNOSIS — M25662 Stiffness of left knee, not elsewhere classified: Secondary | ICD-10-CM | POA: Diagnosis present

## 2020-10-08 DIAGNOSIS — N281 Cyst of kidney, acquired: Secondary | ICD-10-CM | POA: Diagnosis not present

## 2020-10-08 DIAGNOSIS — I700 Atherosclerosis of aorta: Secondary | ICD-10-CM

## 2020-10-08 DIAGNOSIS — M25561 Pain in right knee: Secondary | ICD-10-CM | POA: Diagnosis not present

## 2020-10-08 DIAGNOSIS — Z760 Encounter for issue of repeat prescription: Secondary | ICD-10-CM | POA: Diagnosis not present

## 2020-10-08 DIAGNOSIS — K869 Disease of pancreas, unspecified: Secondary | ICD-10-CM

## 2020-10-08 DIAGNOSIS — K529 Noninfective gastroenteritis and colitis, unspecified: Secondary | ICD-10-CM

## 2020-10-08 DIAGNOSIS — C44311 Basal cell carcinoma of skin of nose: Secondary | ICD-10-CM | POA: Diagnosis not present

## 2020-10-08 DIAGNOSIS — Z09 Encounter for follow-up examination after completed treatment for conditions other than malignant neoplasm: Secondary | ICD-10-CM

## 2020-10-08 DIAGNOSIS — M25562 Pain in left knee: Secondary | ICD-10-CM

## 2020-10-08 DIAGNOSIS — R0602 Shortness of breath: Secondary | ICD-10-CM | POA: Diagnosis not present

## 2020-10-08 DIAGNOSIS — R413 Other amnesia: Secondary | ICD-10-CM | POA: Insufficient documentation

## 2020-10-08 DIAGNOSIS — M170 Bilateral primary osteoarthritis of knee: Secondary | ICD-10-CM | POA: Diagnosis not present

## 2020-10-08 DIAGNOSIS — I10 Essential (primary) hypertension: Secondary | ICD-10-CM | POA: Insufficient documentation

## 2020-10-08 DIAGNOSIS — Z7901 Long term (current) use of anticoagulants: Secondary | ICD-10-CM | POA: Insufficient documentation

## 2020-10-08 DIAGNOSIS — M6281 Muscle weakness (generalized): Secondary | ICD-10-CM

## 2020-10-08 DIAGNOSIS — R29898 Other symptoms and signs involving the musculoskeletal system: Secondary | ICD-10-CM

## 2020-10-08 DIAGNOSIS — F332 Major depressive disorder, recurrent severe without psychotic features: Secondary | ICD-10-CM | POA: Diagnosis not present

## 2020-10-08 DIAGNOSIS — M25661 Stiffness of right knee, not elsewhere classified: Secondary | ICD-10-CM | POA: Diagnosis present

## 2020-10-08 DIAGNOSIS — Z79899 Other long term (current) drug therapy: Secondary | ICD-10-CM | POA: Diagnosis not present

## 2020-10-08 DIAGNOSIS — G629 Polyneuropathy, unspecified: Secondary | ICD-10-CM

## 2020-10-08 DIAGNOSIS — Z86711 Personal history of pulmonary embolism: Secondary | ICD-10-CM | POA: Diagnosis not present

## 2020-10-08 DIAGNOSIS — F419 Anxiety disorder, unspecified: Secondary | ICD-10-CM | POA: Insufficient documentation

## 2020-10-08 DIAGNOSIS — R2689 Other abnormalities of gait and mobility: Secondary | ICD-10-CM | POA: Diagnosis present

## 2020-10-08 DIAGNOSIS — Z01818 Encounter for other preprocedural examination: Secondary | ICD-10-CM

## 2020-10-08 MED ORDER — MIRTAZAPINE 15 MG PO TABS: 15.0000 mg | ORAL_TABLET | Freq: Every day | ORAL | 2 refills | Status: DC

## 2020-10-08 MED ORDER — DICYCLOMINE HCL 10 MG PO CAPS: 10.0000 mg | ORAL_CAPSULE | Freq: Two times a day (BID) | ORAL | 2 refills | Status: DC

## 2020-10-08 MED ORDER — GABAPENTIN 300 MG PO CAPS: 300.0000 mg | ORAL_CAPSULE | Freq: Two times a day (BID) | ORAL | 6 refills | Status: DC

## 2020-10-08 MED ORDER — ONDANSETRON 8 MG PO TBDP: 8.0000 mg | ORAL_TABLET | Freq: Three times a day (TID) | ORAL | 0 refills | Status: DC | PRN

## 2020-10-08 MED ORDER — CITALOPRAM HYDROBROMIDE 20 MG PO TABS: 20.0000 mg | ORAL_TABLET | Freq: Every day | ORAL | 2 refills | Status: DC

## 2020-10-08 MED ORDER — TRAZODONE HCL 100 MG PO TABS: 100.0000 mg | ORAL_TABLET | Freq: Every day | ORAL | 2 refills | Status: DC

## 2020-10-08 NOTE — Patient Instructions (Signed)
Access Code: [email or web address] URL: [email or web address] Date: 10/08/2020 Prepared by: [facility] Lealand Elting  Exercises Seated Long Arc Quad - 2 x daily - 7 x weekly - 10 reps - 5 seconds hold
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
## 2020-10-09 DIAGNOSIS — K862 Cyst of pancreas: Secondary | ICD-10-CM | POA: Insufficient documentation

## 2020-10-09 DIAGNOSIS — I700 Atherosclerosis of aorta: Secondary | ICD-10-CM | POA: Insufficient documentation

## 2020-10-09 DIAGNOSIS — N281 Cyst of kidney, acquired: Secondary | ICD-10-CM | POA: Insufficient documentation

## 2020-10-09 DIAGNOSIS — K8689 Other specified diseases of pancreas: Secondary | ICD-10-CM | POA: Insufficient documentation

## 2020-10-09 DIAGNOSIS — K869 Disease of pancreas, unspecified: Secondary | ICD-10-CM | POA: Insufficient documentation

## 2020-10-09 DIAGNOSIS — C44311 Basal cell carcinoma of skin of nose: Secondary | ICD-10-CM | POA: Insufficient documentation

## 2020-10-09 DIAGNOSIS — E860 Dehydration: Secondary | ICD-10-CM

## 2020-10-09 DIAGNOSIS — G629 Polyneuropathy, unspecified: Secondary | ICD-10-CM | POA: Insufficient documentation

## 2020-10-10 ENCOUNTER — Ambulatory Visit (HOSPITAL_COMMUNITY): Payer: Medicaid Other | Admitting: Physical Therapy

## 2020-10-10 ENCOUNTER — Telehealth: Payer: Self-pay | Admitting: Licensed Clinical Social Worker

## 2020-10-10 ENCOUNTER — Telehealth: Payer: Self-pay

## 2020-10-10 ENCOUNTER — Encounter (HOSPITAL_COMMUNITY): Payer: Self-pay | Admitting: Physical Therapy

## 2020-10-10 ENCOUNTER — Other Ambulatory Visit: Payer: Self-pay

## 2020-10-10 DIAGNOSIS — R29898 Other symptoms and signs involving the musculoskeletal system: Secondary | ICD-10-CM

## 2020-10-10 DIAGNOSIS — M25661 Stiffness of right knee, not elsewhere classified: Secondary | ICD-10-CM

## 2020-10-10 DIAGNOSIS — R2689 Other abnormalities of gait and mobility: Secondary | ICD-10-CM

## 2020-10-10 DIAGNOSIS — M25561 Pain in right knee: Secondary | ICD-10-CM | POA: Diagnosis not present

## 2020-10-10 DIAGNOSIS — M6281 Muscle weakness (generalized): Secondary | ICD-10-CM

## 2020-10-10 DIAGNOSIS — M25662 Stiffness of left knee, not elsewhere classified: Secondary | ICD-10-CM

## 2020-10-10 DIAGNOSIS — M25562 Pain in left knee: Secondary | ICD-10-CM

## 2020-10-10 NOTE — Patient Instructions (Signed)
Access Code: [email or web address] URL: [email or web address] Date: 10/10/2020 Prepared by: Mitzi Hansen Nasim Habeeb  Exercises Supine Active Straight Leg Raise - 1 x daily - 7 x weekly - 2 sets - 10 reps Supine Heel Slide with Strap - 2 x daily - 7 x weekly - 10 reps - 10 second hold Heel rises with counter support - 1 x daily - 7 x weekly - 3 sets - 10 reps Mini Squat with Counter Support - 1 x daily - 7 x weekly - 2 sets - 10 reps
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
## 2020-10-10 NOTE — Telephone Encounter (Signed)
10/08/20-Late Entry Post Call placed to patient to follow up on behavioral health and/or resource needs. Pt reports ongoing anxiety symptoms triggered by health conditions that result in limited mobility. Pt states that she colors daily to help promote relaxation and mindfulness. Denis SI/HI.  Patient shared that PCP has agreed to assist with initiation of [place] to assist with completion of Activities of Daily Living (ADL) Pt questioned if a family or friend can become an aid for her. LCSW discussed Community Alternative Program (CAP) and agreed to mail out brochure to address on file, for instructions on how to apply. Pt was appreciative for the information. No additional concerns noted.

## 2020-10-10 NOTE — Telephone Encounter (Signed)
Completed PCS form faxed to [facility] 

## 2020-10-14 ENCOUNTER — Other Ambulatory Visit: Payer: Self-pay | Admitting: Internal Medicine

## 2020-10-14 ENCOUNTER — Ambulatory Visit (INDEPENDENT_AMBULATORY_CARE_PROVIDER_SITE_OTHER): Payer: Medicaid Other | Admitting: Clinical

## 2020-10-14 ENCOUNTER — Other Ambulatory Visit: Payer: Self-pay

## 2020-10-14 ENCOUNTER — Inpatient Hospital Stay: Payer: Medicaid Other | Admitting: Oncology

## 2020-10-14 ENCOUNTER — Inpatient Hospital Stay: Payer: Medicaid Other

## 2020-10-14 DIAGNOSIS — Z1231 Encounter for screening mammogram for malignant neoplasm of breast: Secondary | ICD-10-CM

## 2020-10-14 DIAGNOSIS — F332 Major depressive disorder, recurrent severe without psychotic features: Secondary | ICD-10-CM

## 2020-10-14 DIAGNOSIS — F1020 Alcohol dependence, uncomplicated: Secondary | ICD-10-CM | POA: Diagnosis not present

## 2020-10-14 DIAGNOSIS — F411 Generalized anxiety disorder: Secondary | ICD-10-CM

## 2020-10-14 NOTE — Progress Notes (Addendum)
  Virtual Visit via Telephone Note  I connected with Maria Murphy on 10/14/20 at  8:00 AM EST by telephone and verified that I am speaking with the correct person using two identifiers.  Location: Patient: Home Provider: Office   I discussed the limitations, risks, security and privacy concerns of performing an evaluation and management service by telephone and the availability of in person appointments. I also discussed with the patient that there may be a patient responsible charge related to this service. The patient expressed understanding and agreed to proceed.  THERAPIST PROGRESS NOTE  Session Time:8:00AM-8:45AM  Participation Level:Active  Behavioral Response:CasualAlertDepressed  Type of Therapy:Individual Therapy  Treatment Goals addressed:Coping  Interventions:CBT, DBT, Motivational Interviewing and Supportive  Summary:Maria Reinanteis a 65 y.o.femalewho presents with Depression and Anxiety.The OPT therapist worked with thepatientfor herinitialOPT treatment. The OPT therapist utilized Motivational Interviewing to assist in creating therapeutic repore. The patient in the session was engaged and work in Science writer about hertriggers and symptoms over the past few weeksincludingphysical health problems, financial stress and stress in her relationship with her sister. The OPT therapist utilized Cognitive Behavioral Therapy through cognitive restructuring as well as worked with the patient on coping strategies to assist in management of anxiety andmood as well as reviewed the impact of her relationship with her sister on the patients mental health.The OPT therapist placed emphasis on self care and being consistent with making and being adherent to the recommendations of all involved health providers.The OPT therapist continued to reviewed implementing boundaries.The OPT therapist worked with the patient on skills to improve self esteem and  self worth.  Suicidal/Homicidal:Nowithout intent/plan  Therapist Response:The OPT therapist worked with the patient for the patients scheduled session. The patient was engaged in hersession and gave feedback in relation to triggers, symptoms, and behavior responses over the pastfewweeks. The OPT therapist worked with the patient utilizing an in session Cognitive Behavioral Therapy exercise. The patient was responsive in the session and verbalized, "Iam working on diet and being careful with what I eat so it does not cause me to get sick with my ulcers".The OPT therapist validated the patients feelings whileplacingemphasis on the importance of maintaining self care. The OPT therapist will continue treatment work with the patient in hernext scheduled session.    Plan: Return again in2/3weeks.   Diagnosis:Axis I:Major depressive disorder, recurrent, severe without psychotic features, GAD, Alcohol use disorder  Axis II:No Diagnoses  I discussed the assessment and treatment plan with the patient. The patient was provided an opportunity to ask questions and all were answered. The patient agreed with the plan and demonstrated an understanding of the instructions.  The patient was advised to call back or seek an in-person evaluation if the symptoms worsen or if the condition fails to improve as anticipated.  I provided60minutes of non-face-to-face time during this encounter.  Maye Hides, LCSW  10/14/2020

## 2020-10-15 ENCOUNTER — Emergency Department (HOSPITAL_COMMUNITY)
Admission: EM | Admit: 2020-10-15 | Discharge: 2020-10-15 | Disposition: A | Discharge status: Home / Self Care | Payer: Medicaid Other | Attending: Emergency Medicine | Admitting: Emergency Medicine

## 2020-10-15 ENCOUNTER — Ambulatory Visit (HOSPITAL_COMMUNITY): Payer: Medicaid Other

## 2020-10-15 ENCOUNTER — Emergency Department (HOSPITAL_COMMUNITY): Payer: Medicaid Other

## 2020-10-15 ENCOUNTER — Encounter (HOSPITAL_COMMUNITY): Payer: Self-pay | Admitting: Emergency Medicine

## 2020-10-15 DIAGNOSIS — K8689 Other specified diseases of pancreas: Secondary | ICD-10-CM | POA: Diagnosis not present

## 2020-10-15 DIAGNOSIS — R1011 Right upper quadrant pain: Secondary | ICD-10-CM | POA: Diagnosis present

## 2020-10-15 DIAGNOSIS — R197 Diarrhea, unspecified: Secondary | ICD-10-CM | POA: Insufficient documentation

## 2020-10-15 DIAGNOSIS — Z85828 Personal history of other malignant neoplasm of skin: Secondary | ICD-10-CM | POA: Insufficient documentation

## 2020-10-15 DIAGNOSIS — Z7901 Long term (current) use of anticoagulants: Secondary | ICD-10-CM | POA: Diagnosis not present

## 2020-10-15 DIAGNOSIS — Z79899 Other long term (current) drug therapy: Secondary | ICD-10-CM | POA: Diagnosis not present

## 2020-10-15 DIAGNOSIS — R1110 Vomiting, unspecified: Secondary | ICD-10-CM | POA: Diagnosis not present

## 2020-10-15 DIAGNOSIS — I10 Essential (primary) hypertension: Secondary | ICD-10-CM | POA: Insufficient documentation

## 2020-10-15 LAB — COMPREHENSIVE METABOLIC PANEL
ALT: 8 U/L (ref 0–44)
AST: 21 U/L (ref 15–41)
Albumin: 4.6 g/dL (ref 3.5–5.0)
Alkaline Phosphatase: 47 U/L (ref 38–126)
Anion gap: 9 (ref 5–15)
BUN: 20 mg/dL (ref 8–23)
CO2: 23 mmol/L (ref 22–32)
Calcium: 9.5 mg/dL (ref 8.9–10.3)
Chloride: 104 mmol/L (ref 98–111)
Creatinine, Ser: 0.84 mg/dL (ref 0.44–1.00)
GFR, Estimated: >60 mL/min (ref >60)
Glucose, Bld: 116 mg/dL — ABNORMAL HIGH (ref 70–99)
Potassium: 4.1 mmol/L (ref 3.5–5.1)
Sodium: 136 mmol/L (ref 135–145)
Total Bilirubin: 0.6 mg/dL (ref 0.3–1.2)
Total Protein: 7.9 g/dL (ref 6.5–8.1)

## 2020-10-15 LAB — CBC
HCT: 37.2 % (ref 36.0–46.0)
Hemoglobin: 12.2 g/dL (ref 12.0–15.0)
MCH: 28 pg (ref 26.0–34.0)
MCHC: 32.8 g/dL (ref 30.0–36.0)
MCV: 85.5 fL (ref 80.0–100.0)
Platelets: 226 10*3/uL (ref 150–400)
RBC: 4.35 MIL/uL (ref 3.87–5.11)
RDW: 16.3 % — ABNORMAL HIGH (ref 11.5–15.5)
WBC: 6.9 10*3/uL (ref 4.0–10.5)
nRBC: 0 % (ref 0.0–0.2)

## 2020-10-15 LAB — LIPASE, BLOOD: Lipase: 41 U/L (ref 11–51)

## 2020-10-15 MED ORDER — ONDANSETRON HCL 4 MG/2ML IJ SOLN
4.0000 mg | Freq: Once | INTRAMUSCULAR | Status: AC
  Administered 2020-10-15: 4 mg via INTRAVENOUS
  Filled 2020-10-15: qty 2

## 2020-10-15 MED ORDER — MORPHINE SULFATE (PF) 4 MG/ML IV SOLN
4.0000 mg | Freq: Once | INTRAVENOUS | Status: AC
  Administered 2020-10-15: 4 mg via INTRAVENOUS
  Filled 2020-10-15: qty 1

## 2020-10-15 MED ORDER — OXYCODONE HCL 5 MG PO TABS: 5.0000 mg | ORAL_TABLET | ORAL | 0 refills | Status: DC | PRN

## 2020-10-15 MED ORDER — SODIUM CHLORIDE 0.9 % IV BOLUS
1000.0000 mL | Freq: Once | INTRAVENOUS | Status: AC
  Administered 2020-10-15: 1000 mL via INTRAVENOUS

## 2020-10-15 MED ORDER — IOHEXOL 300 MG/ML  SOLN
100.0000 mL | Freq: Once | INTRAMUSCULAR | Status: AC | PRN
  Administered 2020-10-15: 100 mL via INTRAVENOUS

## 2020-10-15 NOTE — Discharge Instructions (Signed)
Your CT scan shows that there is a mass in your pancreas which is slowly getting larger.  Please follow-up with your gastroenterologist for further evaluation of this mass.  Return to the emergency department if pain is not being adequately controlled at home.

## 2020-10-15 NOTE — ED Triage Notes (Signed)
Pt with abdominal pain that started last Wednesday.

## 2020-10-15 NOTE — ED Notes (Signed)
Patient transported to CT 

## 2020-10-15 NOTE — Progress Notes (Signed)
Referring Provider: Ladell Pier, MD Primary Care Physician:  Ladell Pier, MD Primary GI Physician: Dr. Gala Romney  Chief Complaint  Patient presents with  . Abdominal Pain    Loose stools x 1 month    HPI:   Maria Murphy is a 65 y.o. female presenting today for hospital follow-up. History of PUD/duodenal ulcers with perforated prepyloric gastric ulcer (chronic NSAID use) in August 2021 s/p Phillip Heal patch, complicated by abdominal abscess and saddle PE, on Xarelto. Since then she has had  intermittent recurrent RUQ abdominal pain. Last EGD 07/10/2020 during a hospital admission for abdominal pain, N/V with small hiatal hernia, scar in the gastric antrum and prepyloric region, acquired deformity in the pylorus, 2 nonbleeding cratered duodenal ulcers, acquired distal bulbar duodenal stenosis.  H. pylori serologies negative. Recommended repeat EGD in 12 weeks. Most recent hospital admission in January 2022 as detailed below. Also with normocytic anemia since August 2021 when PUD was first discovered, cholelithiasis with HIDA normal in January 2022, multilobulated cystic pancreatic lesion, chronic intermittent diarrhea since September 2021, enteropathogenic E. coli in November 2021.   Last hospitalization from 09/23/2020-09/27/2020 after presenting with worsening RUQ abdominal pain, nausea, vomiting, and worsening diarrhea 2 days prior to admission. CT 09/24/2020 demonstrated cholelithiasis with some pericholecystic inflammatory change thought to be secondary to PUD rather than cholecystitis per general surgery, multilobulated pancreatic cyst stable, circumferentially thickened appearance of gastric antrum and duodenal bulb with no evidence of frank perforation or abscess. She had clinical improvement of abdominal pain after receiving a dose of pain medications and didn't require any ongoing dosing. No EGD performed as she was due for repeat EGD in the coming weeks. Suspected pain secondary to  ongoing PUD. Serum Gastrin normal 09/25/20. Diarrhea also self resolved.   Patient presented to the emergency room 10/15/2020 reporting 6 days of RUQ abdominal pain with associated vomiting and diarrhea.  Has been taking loperamide for diarrhea and Zofran for nausea but continued with abdominal pain.  CBC, CMP, lipase unrevealing.  CT findings detailed below.   CT A/P with contrast: 1. 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 This was evaluated by MRI in August 2021 (please see that report), and appears mildly larger (4.3 cm) with increased conspicuity of the pancreatic duct since that time. 2. Continued inflamed appearance of the distal stomach and proximal duodenum compatible with Recurrent Acute Peptic Ulcer Disease. Adjacent mesenteric fat stranding seems increased since 09/24/2020, although there is no evidence of perforation 3. Persistent mildly inflamed appearance of the gallbladder and porta hepatis region also, with underlying Cholelithiasis. 4. Otherwise stable abdomen and pelvis, including renal cysts which had a benign MRI appearance, sigmoid diverticulosis, fibroid uterus.  She received IV fluids, Zofran, and morphine with clinical improvement.  She was discharged with oxycodone for pain management, continue other current medications, and follow-up with GI.  She returned to the emergency room early hours this morning for abdominal pain despite taking oxycodone every 4 hours.  No nausea or vomiting.  Repeat labs with no leukocytosis, LFTs normal, kidney function electrolytes normal, lipase normal.  Chest and abdomen x-ray with no free air, moderate colonic stool burden, calcification at the pancreatic head associated with pancreatic cyst, calcified gallstones.  She received IV pain medications with improvement.  She was advised to increase oxycodone to 5-10 mg every 4 hours and follow-up with GI.  Today:   Abdominal Pain:  Used to have occasional RUQ  pain with eating, now it is every time she  eats. This started 10 days ago. Sharp stabbing pain. Associated nausea/vomiting if eating. Drinking a protein shake without milk and is able to keep this down. Also making shakes with vegetables and fruit. Not eating anything solid. Even water will give her pain. Reports about a 10 lb weight loss. Vomiting about twice a day.  Pain is now persistent unless taking pain medications. Also with mid back pain.   No GERD symptoms.  No NSAIDs. Only tylenol.   Having to take oxycodone 10 mg every 4 hours. Pain is a 10/10 when medication is wearing off.  Also taking Tylenol 3 times a day.  Taking Protonix 40 mg BID and carafate.   Diarrhea: Stools were like water about 3 days ago with 10 BMs. Now soft with 3 BMs a day. Watery stools are 2-3 days a week. Taking dicyclomine daily. Helped somewhat. No brbpr or meelna.   Past Medical History:  Diagnosis Date  . Collagen vascular disease ([place])   . Essential hypertension, benign 01/24/2015  . Foot ulcer ([place])   . Hypertension   . Memory loss   . PUD (peptic ulcer disease)    with perforation s/p exploratory laparotomy  . Skin cancer   . Vision abnormalities     Past Surgical History:  Procedure Laterality Date  . CENTRAL LINE INSERTION Right 04/22/2020   Procedure: CENTRAL LINE INSERTION;  Surgeon: Virl Cagey, MD;  Location: [facility];  Service: General;  Laterality: Right;  . ESOPHAGOGASTRODUODENOSCOPY (EGD) WITH PROPOFOL N/A 07/10/2020   Procedure: ESOPHAGOGASTRODUODENOSCOPY (EGD) WITH PROPOFOL;  Surgeon: Rogene Houston, MD;  Location: [facility];  Service: Endoscopy;  Laterality: N/A;  . GASTRORRHAPHY  04/22/2020   Procedure: GASTRORRHAPHY;  Surgeon: Virl Cagey, MD;  Location: [facility];  Service: General;;  . IR CATHETER TUBE CHANGE  05/09/2020  . LAPAROTOMY N/A 04/22/2020   Procedure: EXPLORATORY LAPAROTOMY;  Surgeon: Virl Cagey, MD;  Location: [facility];  Service: General;  Laterality:  N/A;  . TONSILLECTOMY      No current facility-administered medications for this visit.   No current outpatient medications on file.   Facility-Administered Medications Ordered in Other Visits  Medication Dose Route Frequency Provider Last Rate Last Admin  . citalopram (CELEXA) tablet 20 mg  20 mg Oral Daily Tat, David, MD   20 mg at 10/20/20 1003  . HYDROmorphone (DILAUDID) injection 0.5 mg  0.5 mg Intravenous Q4H PRN Adefeso, Oladapo, DO   0.5 mg at 10/19/20 1928  . labetalol (NORMODYNE) injection 5 mg  5 mg Intravenous Q6H PRN Adefeso, Oladapo, DO      . mirtazapine (REMERON) tablet 15 mg  15 mg Oral Benay Pike, MD   15 mg at 10/19/20 2127  . ondansetron (ZOFRAN) injection 4 mg  4 mg Intravenous Q6H PRN Adefeso, Oladapo, DO   4 mg at 10/19/20 1928  . pantoprazole (PROTONIX) 80 mg in sodium chloride 0.9 % 100 mL (0.8 mg/mL) infusion  8 mg/hr Intravenous Continuous Adefeso, Oladapo, DO 10 mL/hr at 10/20/20 0246 8 mg/hr at 10/20/20 0246  . sucralfate (CARAFATE) 1 GM/10ML suspension 1 g  1 g Oral TID WC & HS Harvel Quale, MD   1 g at 10/20/20 1240    Allergies as of 10/16/2020 - Review Complete 10/16/2020  Allergen Reaction Noted  . Wheat extract Swelling 10/29/2017    Family History  Problem Relation Age of Onset  . High Cholesterol Mother   . Hypertension Mother   . Arthritis/Rheumatoid Mother   .  Alcohol abuse Father   . Colon cancer Neg Hx   . Colon polyps Neg Hx   . Inflammatory bowel disease Neg Hx     Social History   Socioeconomic History  . Marital status: Widowed    Spouse name: Not on file  . Number of children: 2  . Years of education: Not on file  . Highest education level: Not on file  Occupational History  . Not on file  Tobacco Use  . Smoking status: Never Smoker  . Smokeless tobacco: Never Used  Vaping Use  . Vaping Use: Never used  Substance and Sexual Activity  . Alcohol use: Yes    Alcohol/week: 1.0 standard drink    Types: 1  Glasses of wine per week    Comment: drank alcohol for past 2 weeks, one glass of wine. Prior to this would drink glass of alcohol on weekends with dinner. 09/05/20: 3 glasses of wine a day  . Drug use: No  . Sexual activity: Not Currently    Birth control/protection: Post-menopausal  Other Topics Concern  . Not on file  Social History Narrative  . Not on file   Social Determinants of Health   Financial Resource Strain: Not on file  Food Insecurity: Not on file  Transportation Needs: Not on file  Physical Activity: Not on file  Stress: Not on file  Social Connections: Not on file    Review of Systems: Gen: Denies fever, chills, cold or flu like symptoms, presyncope, syncope. CV: Denies chest pain or palpitations. Resp: Denies dyspnea or cough. GI: See HPI Heme: See HPI  Physical Exam: BP 136/83   Pulse 86   Temp (!) 96.8 F (36 C) (Temporal)   Ht 5\' 4"  (1.626 m)   Wt 183 lb 3.2 oz (83.1 kg)   BMI 31.45 kg/m  General:  Alert and oriented. Patient looks tired, like she doesn't feel well. Intermittent moaning and holding her right side as her pain medication is wearing off.   Head:  Normocephalic and atraumatic. Eyes:  Conjuctiva clear without scleral icterus. Heart:  S1, S2 present without murmurs appreciated. Lungs:  Clear to auscultation bilaterally. No wheezes, rales, or rhonchi. No distress.  Abdomen:  +BS, soft, and non-distended. Moderate TTP in epigastric and RUQ area. No rebound or guarding. No HSM or masses noted. Msk:  Symmetrical without gross deformities. Normal posture. Extremities:  Without edema. Neurologic:  Alert and  oriented x4 Psych:  Normal mood and affect.  Assessment:  65 year old female with history of PUD/duodenal ulcers with perforated prepyloric gastric ulcer (chronic NSAID use) in August 2021 s/p Phillip Heal patch, complicated by abdominal abscess and saddle PE, on Xarelto. Also with anemia since August 2021 when PUD was first discovered,  multilobulated pancreatic lesion, and chronic intermittent diarrhea.  Since PUD discovery, she has continued with intermittent recurrent RUQ abdominal pain with admission in January 2022 as well as 2 recent ER visits as detailed in HPI.  Last EGD November 2021 with scar in the gastric antrum and prepyloric region, acquired deformity in the pylorus, 2 nonbleeding cratered duodenal ulcers, acquired distal bulbar duodenal stenosis.  Additional evaluation has included H. pylori serologies negative, CBC, CMP, lipase unrevealing.  Serum gastrin was also within normal limits 09/25/2020.  Most recent CT 10/15/2020 with continued inflamed appearance of distal stomach and proximal duodenum, persistent mildly inflamed appearance of the gallbladder and porta hepatitis region with underlying cholelithiasis, and appearance suspicious for slowly enlarging cystic pancreatic neoplasm. General surgery previously felt gallbladder  inflammation may be related to PUD rather than cholecystitis.   Over the last 10 days, RUQ abdominal pain has been worsening and is now persistent requiring oxycodone 10 mg every 4 hours.  Tolerating liquid diet.  States all p.o. intake will worsen abdominal pain with associated nausea/vomiting. She reports compliance with PPI twice daily and Carafate.  She has not taken any NSAID products since September 05, 2020.   Symptoms likely multifactorial in the setting of persistent ulcerative disease, possible pancreatic malignancy, and cannot rule out gallbladder etiology.  Notably, patient is due for repeat EGD and has never had biopsies of the duodenal lesion. Suspect she also needs EUS with possible FNA. Will reach out to Dr. Ardis Hughs regarding this. I will also discuss her case again with Dr. Constance Haw who previously saw patient during last hospitalization.   Diarrhea: Chronic intermittent diarrhea since September 2021.  Reports stress seems to worsen symptoms and is currently taking Bentyl once daily with some  improvement. Notably, she did test positive for enteropathogenic E. coli in November 2021.  Query IBS/post infectious IBS. Also concerned about underlying pancreatic malignancy with enlarging pancreatic cyst. Additional considerations include celiac disease and thyroid abnormalities.  Less likely infectious diarrhea.  Not consistent with microscopic colitis or IBD. She has never had a colonoscopy and will need on in the future but would not tolerate the prep at this time with significant upper GI symptoms.    Anemia: Normocytic anemia since August 2021 with PUD as first discovered.  Most recent hemoglobin 11.1 earlier this morning in the emergency room.  This is stable/improved since hospital discharge in February.  Denies overt GI bleeding.  She is due for repeat EGD for PUD surveillance.  She also needs first-ever colonoscopy, but would not tolerate prep at this time considering significant upper GI symptoms.  Plan: 1.  I will discuss possible EUS/FNA with Dr. Ardis Hughs. Will also discuss gallbladder inflammation with Dr. Constance Haw. Further recommendations to follow.  2.  Patient needs EGD with propofol ASAP.  Will work on getting this arranged with either Dr. Gala Romney or Dr. Abbey Chatters unless Dr. Ardis Hughs is able to complete EGD with EUS before we can get her in.  3.  Considering severity of symptoms, I have advised the patient to proceed to the emergency room if she has any worsening symptoms, if she feels oxycodone is not managing her pain adequately, or if she develops fever.  I feel she likely needs to be in the hospital at this time but has been sent home from the ER twice within the last 24 hours.  If she presents to emergency room again, recommend admission with GI consultation.  4.  Continue Protonix 40 mg twice daily. 5.  Continue Carafate 3 times daily before meals and at bedtime. 6.  Continue Zofran 8 mg every 8 hours as needed. 7.  Continue oxycodone 5 to 10 mg every 4 hours as needed for severe  pain. 8.  Check celiac serologies and TSH. 9.  Advised if she has recurrent watery stools to complete stool studies.  Orders were placed today. 10.  Trial Creon 72,000 units with meals and 36,000 units with snacks. 11.  Can continue taking Bentyl daily.   12.  We will follow up after procedures.
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
## 2020-10-16 ENCOUNTER — Emergency Department (HOSPITAL_COMMUNITY): Payer: Medicaid Other

## 2020-10-16 ENCOUNTER — Emergency Department (HOSPITAL_COMMUNITY)
Admission: EM | Admit: 2020-10-16 | Discharge: 2020-10-16 | Disposition: A | Discharge status: Home / Self Care | Payer: Medicaid Other | Attending: Emergency Medicine | Admitting: Emergency Medicine

## 2020-10-16 ENCOUNTER — Other Ambulatory Visit: Payer: Self-pay

## 2020-10-16 ENCOUNTER — Encounter: Payer: Self-pay | Admitting: Gastroenterology

## 2020-10-16 ENCOUNTER — Encounter (HOSPITAL_COMMUNITY): Payer: Self-pay | Admitting: Emergency Medicine

## 2020-10-16 ENCOUNTER — Ambulatory Visit: Payer: Medicaid Other | Admitting: Gastroenterology

## 2020-10-16 ENCOUNTER — Encounter (HOSPITAL_COMMUNITY): Payer: Self-pay | Admitting: Physical Therapy

## 2020-10-16 ENCOUNTER — Ambulatory Visit (HOSPITAL_COMMUNITY): Payer: Medicaid Other | Admitting: Physical Therapy

## 2020-10-16 VITALS — BP 136/83 | HR 86 | Temp 96.8°F | Ht 64.0 in | Wt 183.2 lb

## 2020-10-16 DIAGNOSIS — R1013 Epigastric pain: Secondary | ICD-10-CM

## 2020-10-16 DIAGNOSIS — I10 Essential (primary) hypertension: Secondary | ICD-10-CM | POA: Insufficient documentation

## 2020-10-16 DIAGNOSIS — R109 Unspecified abdominal pain: Secondary | ICD-10-CM

## 2020-10-16 DIAGNOSIS — R2689 Other abnormalities of gait and mobility: Secondary | ICD-10-CM

## 2020-10-16 DIAGNOSIS — Z85828 Personal history of other malignant neoplasm of skin: Secondary | ICD-10-CM | POA: Diagnosis not present

## 2020-10-16 DIAGNOSIS — R197 Diarrhea, unspecified: Secondary | ICD-10-CM

## 2020-10-16 DIAGNOSIS — Z79899 Other long term (current) drug therapy: Secondary | ICD-10-CM | POA: Insufficient documentation

## 2020-10-16 DIAGNOSIS — K869 Disease of pancreas, unspecified: Secondary | ICD-10-CM | POA: Diagnosis not present

## 2020-10-16 DIAGNOSIS — M25661 Stiffness of right knee, not elsewhere classified: Secondary | ICD-10-CM

## 2020-10-16 DIAGNOSIS — R1011 Right upper quadrant pain: Secondary | ICD-10-CM | POA: Diagnosis present

## 2020-10-16 DIAGNOSIS — M25561 Pain in right knee: Secondary | ICD-10-CM

## 2020-10-16 DIAGNOSIS — K862 Cyst of pancreas: Secondary | ICD-10-CM

## 2020-10-16 DIAGNOSIS — R112 Nausea with vomiting, unspecified: Secondary | ICD-10-CM

## 2020-10-16 DIAGNOSIS — M25562 Pain in left knee: Secondary | ICD-10-CM

## 2020-10-16 DIAGNOSIS — Z7901 Long term (current) use of anticoagulants: Secondary | ICD-10-CM | POA: Insufficient documentation

## 2020-10-16 DIAGNOSIS — M25662 Stiffness of left knee, not elsewhere classified: Secondary | ICD-10-CM

## 2020-10-16 DIAGNOSIS — K8689 Other specified diseases of pancreas: Secondary | ICD-10-CM

## 2020-10-16 DIAGNOSIS — R29898 Other symptoms and signs involving the musculoskeletal system: Secondary | ICD-10-CM

## 2020-10-16 DIAGNOSIS — M6281 Muscle weakness (generalized): Secondary | ICD-10-CM

## 2020-10-16 LAB — COMPREHENSIVE METABOLIC PANEL
ALT: 9 U/L (ref 0–44)
AST: 16 U/L (ref 15–41)
Albumin: 4.1 g/dL (ref 3.5–5.0)
Alkaline Phosphatase: 42 U/L (ref 38–126)
Anion gap: 9 (ref 5–15)
BUN: 21 mg/dL (ref 8–23)
CO2: 22 mmol/L (ref 22–32)
Calcium: 9.3 mg/dL (ref 8.9–10.3)
Chloride: 106 mmol/L (ref 98–111)
Creatinine, Ser: 0.72 mg/dL (ref 0.44–1.00)
GFR, Estimated: >60 mL/min (ref >60)
Glucose, Bld: 116 mg/dL — ABNORMAL HIGH (ref 70–99)
Potassium: 3.9 mmol/L (ref 3.5–5.1)
Sodium: 137 mmol/L (ref 135–145)
Total Bilirubin: 0.4 mg/dL (ref 0.3–1.2)
Total Protein: 7.1 g/dL (ref 6.5–8.1)

## 2020-10-16 LAB — LIPASE, BLOOD: Lipase: 30 U/L (ref 11–51)

## 2020-10-16 LAB — CBC WITH DIFFERENTIAL/PLATELET
Abs Immature Granulocytes: 0.01 10*3/uL (ref 0.00–0.07)
Basophils Absolute: 0 10*3/uL (ref 0.0–0.1)
Basophils Relative: 1 %
Eosinophils Absolute: 0.1 10*3/uL (ref 0.0–0.5)
Eosinophils Relative: 1 %
HCT: 34.9 % — ABNORMAL LOW (ref 36.0–46.0)
Hemoglobin: 11.1 g/dL — ABNORMAL LOW (ref 12.0–15.0)
Immature Granulocytes: 0 %
Lymphocytes Relative: 15 %
Lymphs Abs: 1 10*3/uL (ref 0.7–4.0)
MCH: 27.4 pg (ref 26.0–34.0)
MCHC: 31.8 g/dL (ref 30.0–36.0)
MCV: 86.2 fL (ref 80.0–100.0)
Monocytes Absolute: 0.5 10*3/uL (ref 0.1–1.0)
Monocytes Relative: 8 %
Neutro Abs: 5 10*3/uL (ref 1.7–7.7)
Neutrophils Relative %: 75 %
Platelets: 199 10*3/uL (ref 150–400)
RBC: 4.05 MIL/uL (ref 3.87–5.11)
RDW: 16.2 % — ABNORMAL HIGH (ref 11.5–15.5)
WBC: 6.6 10*3/uL (ref 4.0–10.5)
nRBC: 0 % (ref 0.0–0.2)

## 2020-10-16 MED ORDER — OXYCODONE-ACETAMINOPHEN 5-325 MG PO TABS
1.0000 | ORAL_TABLET | Freq: Once | ORAL | Status: AC
Start: 2020-10-16 — End: 2020-10-16
  Administered 2020-10-16: 1 via ORAL
  Filled 2020-10-16: qty 1

## 2020-10-16 MED ORDER — PANCRELIPASE (LIP-PROT-AMYL) 36000-114000 UNITS PO CPEP: ORAL_CAPSULE | ORAL | 11 refills | Status: DC

## 2020-10-16 MED ORDER — ONDANSETRON HCL 4 MG/2ML IJ SOLN
4.0000 mg | Freq: Once | INTRAMUSCULAR | Status: AC
  Administered 2020-10-16: 4 mg via INTRAVENOUS
  Filled 2020-10-16: qty 2

## 2020-10-16 MED ORDER — HYDROMORPHONE HCL 1 MG/ML IJ SOLN
1.0000 mg | Freq: Once | INTRAMUSCULAR | Status: AC
  Administered 2020-10-16: 1 mg via INTRAVENOUS
  Filled 2020-10-16: qty 1

## 2020-10-16 MED ORDER — ONDANSETRON 8 MG PO TBDP: 8.0000 mg | ORAL_TABLET | Freq: Three times a day (TID) | ORAL | 1 refills | Status: DC | PRN

## 2020-10-16 MED ORDER — OXYCODONE HCL 5 MG PO TABS: ORAL_TABLET | ORAL | 0 refills | Status: DC

## 2020-10-16 NOTE — ED Triage Notes (Signed)
Pt was here for the same abd pain last night. Pt states oxycodone doesn't work for her pain.
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
## 2020-10-16 NOTE — Discharge Instructions (Addendum)
You were seen today for ongoing abdominal pain.  Your lab work is stable.  This may be related to the growing pancreatic mass you have.  You may increase pain medication to 2 tablets or 10 mg every 4 hours as needed.  Follow-up closely with gastroenterology
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
## 2020-10-16 NOTE — Patient Instructions (Signed)
Please have labs completed at [facility].  If you have return of watery diarrhea, please complete stool studies.  I already placed orders for these and you can pick up the kit from the lab.  I would like to try you on Creon (pancreatic enzyme) to see if this will help with your loose stools.  Take 2 capsules with meals and 1 capsule with snacks.  I have sent a prescription to your pharmacy.  You may continue Bentyl daily.   I am reaching out to Dr. Constance [facility] with general surgery as well as Dr. Ardis Hughs gastroenterologist in [place] to discuss your case further.   You are due for an upper endoscopy at this time to follow-up on the ulcer in the first part of your small intestine.  However, you also need an endoscopic ultrasound and these procedures may potentially be completed at the same time.  Continue taking oxycodone 5-10 mg every 4 hours as needed for severe pain.  Continue Zofran 8 mg every 8 hours as needed.  I have sent in a refill to your pharmacy.  Continue Protonix 40 mg twice daily.  Continue Carafate.  If you have any worsening pain or your pain is not adequately managed at home or fever please proceed to the emergency room.  I will reach back out to you ASAP once I have discussed with Dr. Constance Haw and Dr. Ardis Hughs.  Aliene Altes, PA-C [facility]

## 2020-10-17 ENCOUNTER — Encounter (HOSPITAL_COMMUNITY): Payer: Self-pay | Admitting: Physical Therapy

## 2020-10-17 ENCOUNTER — Telehealth: Payer: Self-pay | Admitting: Gastroenterology

## 2020-10-17 ENCOUNTER — Telehealth: Payer: Self-pay

## 2020-10-17 ENCOUNTER — Other Ambulatory Visit: Payer: Self-pay

## 2020-10-17 ENCOUNTER — Ambulatory Visit (HOSPITAL_COMMUNITY): Payer: Medicaid Other | Admitting: Physical Therapy

## 2020-10-17 DIAGNOSIS — M25662 Stiffness of left knee, not elsewhere classified: Secondary | ICD-10-CM

## 2020-10-17 DIAGNOSIS — M6281 Muscle weakness (generalized): Secondary | ICD-10-CM

## 2020-10-17 DIAGNOSIS — R29898 Other symptoms and signs involving the musculoskeletal system: Secondary | ICD-10-CM

## 2020-10-17 DIAGNOSIS — M25661 Stiffness of right knee, not elsewhere classified: Secondary | ICD-10-CM

## 2020-10-17 DIAGNOSIS — R2689 Other abnormalities of gait and mobility: Secondary | ICD-10-CM

## 2020-10-17 DIAGNOSIS — M25562 Pain in left knee: Secondary | ICD-10-CM

## 2020-10-17 DIAGNOSIS — K862 Cyst of pancreas: Secondary | ICD-10-CM

## 2020-10-17 DIAGNOSIS — K275 Chronic or unspecified peptic ulcer, site unspecified, with perforation: Secondary | ICD-10-CM

## 2020-10-17 DIAGNOSIS — M25561 Pain in right knee: Secondary | ICD-10-CM

## 2020-10-17 NOTE — Patient Instructions (Signed)
Access Code: [email or web address] URL: [email or web address] Date: 10/17/2020 Prepared by: Mitzi Hansen Jourdyn Ferrin  Exercises Step Up - 1 x daily - 7 x weekly - 2 sets - 10 reps Standing Tandem Balance with Counter Support - 1 x daily - 7 x weekly - 2 reps - 30 seconds hold

## 2020-10-17 NOTE — Telephone Encounter (Signed)
Discussed case with Dr. Constance Haw.  Dr. Constance Haw stated going into her abdomen would be very difficult in the setting of this chronic inflammation.  Stated would pursue EGD and EUS first, and if gallbladder ended up being an issue, she would probably need cholecystostomy tube due to the amount of chronic inflammation in this area.  States the area would need to cool off for a while.  Also discussed with Dr. Ardis Hughs. Agrees EUS is needed. He and Dr. Rush Landmark are also booked up for several weeks and would not be able to do and EGD with EUS any sooner than we would be able to arrange EGD alone. Recommended if possible, we go ahead and proceed with EGD locally.   Alicia: Please let patient know I have discussed her case with Dr. Constance Haw with general surgery and Dr. Ardis Hughs at [facility] in [place]. Dr.Bridges does not recommend any sort of surgery at this time for her gallbladder. Dr. Ardis Hughs agrees with the need for endoscopic ultrasound to evaluate the pancreatic lesion.  His office has actually already reached out to patient and has her scheduled with Dr. Lia Foyer on 3/30 for endoscopic ultrasound.  Dr. Ardis Hughs did say that his office was not able to accommodate EGD with EUS any sooner than we would be able to perform EGD alone.  He recommended we pursue EGD locally and hopefully prior to EUS.  As far as I know, we do not have any openings until the latter part of March.  However, I will recommend she is scheduled for EGD ASAP and on the cancellation list.  If she has any worsening symptoms, or her pain is not adequately managed with the oxycodone, she should present to the emergency room and will need admission for procedures inpatient.  We need to know who manages her Xarelto and get the ok to hold this x48 hours prior to her procedure ASAP.   [facility] Clinical Pool:  Please arrange EGD with propofol ASAP with Dr. Gala Romney or Dr. Abbey Chatters. She is Dr. Roseanne Kaufman patient, but I have discussed with Dr. Abbey Chatters, and he is  willing to do procedure if he has an opening before Dr. Gala Romney.  Please place on cancellation list. She really needs a procedures within the next week if possible.  ASA III Dx: duodenal ulcer, abdominal pain, nausea with vomiting Need ok to hold xarelto 48 hours prior to procedure.

## 2020-10-17 NOTE — Telephone Encounter (Signed)
The pt has been scheduled for EUS with Dr Rush Landmark on 11/20/20 at 1015 am.  COVID test on 11/16/20 at 1025 am.  Pt is on xarelto will need to send anti coag letter.  Who manages?? Left message on machine to call back
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
## 2020-10-17 NOTE — Telephone Encounter (Signed)
-----   Message from Milus Banister, MD sent at 10/17/2020 11:38 AM EST ----- Regarding: RE: Pancreatic cyst, enlarging, progressive abdominal pain, ? malignancy Interesting case.  I am not sure what is driving her symptoms but suspect it is more related to her known ulcer disease. The cystic area in the pancreas and the known ulcer disease (duodenal bulb) are in very close proximity.  I think upper EUS to check for underlying neoplasm is a good idea.  Gabe and I are both booked out several weeks for these, we'll get her in as soon as possible.  If possible or certainly if she presents acutely with worsening symptoms prior to then I think EGD will be helpful to get a handle on her luminal ulcer disease.  thanks  Ayelen Sciortino, She needs EUS with either myeself or Gabe, first available for abnormal pancreas, recent severe PUD.  Thanks  Wynetta Fines    ----- Message ----- From: Roselyn Reef Sent: 10/16/2020   4:43 PM EST To: Milus Banister, MD Subject: Pancreatic cyst, enlarging, progressive abdo#  Hi Dr. Ardis Hughs,   Wanted to discuss this patient's case with me.  She has history of a multilobulated, cystic, partially calcified lesion in the head of the pancreas that has enlarged by 10 mm since August 2021.  Previously with intermittent postprandial RUQ abdominal pain, now with progressive, severe, persistent epigastric, RUQ abdominal pain, and mid back pain worsened by meals and associated nausea and vomiting requiring oxycodone 10 mg every 4 hours for pain management.   She does have history of PUD/duodenal ulcers with perforated prepyloric gastric ulcer in August 2021 s/p Phillip Heal patch.  Last EGD in November 2021 with 2 nonbleeding cratered duodenal ulcers, acquired distal bulbar duodenal stenosis, currently due for repeat EGD.  Also with evidence of cholelithiasis and mild pericholecystic inflammation on prior CT thought to be more related to PUD rather than cholecystitis.  Labs including CBC, CMP,  and lipase have been reassuring thus far.  Considering progressive symptoms and enlarging pancreatic lesion, I am concerned for possible malignancy and suspect she likely needs EUS with FNA. Wanted to get your thoughts and see if you would be willing to perform this procedure for her. As previously mentioned, she is due for repeat EGD and we will be working on getting this arrange. Not sure how soon EUS could be performed and if it may be better to go ahead and have both procedures (EUS and EGD) completed together? Currently we are booked out to last week of March, but I am going to talk with Dr. Gala Romney and Dr. Abbey Chatters to see if we could squeeze her in if not able to have procedures with you any sooner.   Thanks,  Aliene Altes, [facility]

## 2020-10-18 ENCOUNTER — Encounter (HOSPITAL_COMMUNITY)
Admission: EM | Disposition: A | Discharge status: Home Health | Payer: Self-pay | Source: Home / Self Care | Attending: Internal Medicine

## 2020-10-18 ENCOUNTER — Inpatient Hospital Stay (HOSPITAL_COMMUNITY): Payer: Medicaid Other | Admitting: Anesthesiology

## 2020-10-18 ENCOUNTER — Inpatient Hospital Stay (HOSPITAL_COMMUNITY)
Admission: EM | Admit: 2020-10-18 | Discharge: 2020-10-23 | DRG: 378 | Disposition: A | Discharge status: Home Health | Payer: Medicaid Other | Attending: Family Medicine | Admitting: Family Medicine

## 2020-10-18 ENCOUNTER — Other Ambulatory Visit: Payer: Self-pay

## 2020-10-18 ENCOUNTER — Encounter (HOSPITAL_COMMUNITY): Payer: Self-pay | Admitting: Emergency Medicine

## 2020-10-18 ENCOUNTER — Inpatient Hospital Stay: Payer: Medicaid Other

## 2020-10-18 ENCOUNTER — Inpatient Hospital Stay (HOSPITAL_COMMUNITY): Payer: Medicaid Other

## 2020-10-18 ENCOUNTER — Inpatient Hospital Stay: Payer: Medicaid Other | Admitting: Oncology

## 2020-10-18 ENCOUNTER — Emergency Department (HOSPITAL_COMMUNITY): Payer: Medicaid Other

## 2020-10-18 DIAGNOSIS — R1013 Epigastric pain: Secondary | ICD-10-CM

## 2020-10-18 DIAGNOSIS — Z83438 Family history of other disorder of lipoprotein metabolism and other lipidemia: Secondary | ICD-10-CM | POA: Diagnosis not present

## 2020-10-18 DIAGNOSIS — Z86711 Personal history of pulmonary embolism: Secondary | ICD-10-CM | POA: Diagnosis not present

## 2020-10-18 DIAGNOSIS — Z85828 Personal history of other malignant neoplasm of skin: Secondary | ICD-10-CM

## 2020-10-18 DIAGNOSIS — K8689 Other specified diseases of pancreas: Secondary | ICD-10-CM | POA: Diagnosis not present

## 2020-10-18 DIAGNOSIS — K254 Chronic or unspecified gastric ulcer with hemorrhage: Principal | ICD-10-CM | POA: Diagnosis present

## 2020-10-18 DIAGNOSIS — F32A Depression, unspecified: Secondary | ICD-10-CM | POA: Diagnosis present

## 2020-10-18 DIAGNOSIS — Z6831 Body mass index (BMI) 31.0-31.9, adult: Secondary | ICD-10-CM | POA: Diagnosis not present

## 2020-10-18 DIAGNOSIS — K2961 Other gastritis with bleeding: Secondary | ICD-10-CM | POA: Diagnosis not present

## 2020-10-18 DIAGNOSIS — K253 Acute gastric ulcer without hemorrhage or perforation: Secondary | ICD-10-CM | POA: Diagnosis not present

## 2020-10-18 DIAGNOSIS — K449 Diaphragmatic hernia without obstruction or gangrene: Secondary | ICD-10-CM | POA: Diagnosis present

## 2020-10-18 DIAGNOSIS — K8020 Calculus of gallbladder without cholecystitis without obstruction: Secondary | ICD-10-CM | POA: Diagnosis present

## 2020-10-18 DIAGNOSIS — K264 Chronic or unspecified duodenal ulcer with hemorrhage: Secondary | ICD-10-CM | POA: Diagnosis present

## 2020-10-18 DIAGNOSIS — K259 Gastric ulcer, unspecified as acute or chronic, without hemorrhage or perforation: Secondary | ICD-10-CM

## 2020-10-18 DIAGNOSIS — Z79899 Other long term (current) drug therapy: Secondary | ICD-10-CM

## 2020-10-18 DIAGNOSIS — K921 Melena: Secondary | ICD-10-CM | POA: Diagnosis not present

## 2020-10-18 DIAGNOSIS — D62 Acute posthemorrhagic anemia: Secondary | ICD-10-CM | POA: Diagnosis present

## 2020-10-18 DIAGNOSIS — I700 Atherosclerosis of aorta: Secondary | ICD-10-CM | POA: Diagnosis present

## 2020-10-18 DIAGNOSIS — R1011 Right upper quadrant pain: Secondary | ICD-10-CM

## 2020-10-18 DIAGNOSIS — Z8249 Family history of ischemic heart disease and other diseases of the circulatory system: Secondary | ICD-10-CM

## 2020-10-18 DIAGNOSIS — R197 Diarrhea, unspecified: Secondary | ICD-10-CM

## 2020-10-18 DIAGNOSIS — I10 Essential (primary) hypertension: Secondary | ICD-10-CM | POA: Diagnosis present

## 2020-10-18 DIAGNOSIS — K922 Gastrointestinal hemorrhage, unspecified: Secondary | ICD-10-CM | POA: Diagnosis present

## 2020-10-18 DIAGNOSIS — Z811 Family history of alcohol abuse and dependence: Secondary | ICD-10-CM

## 2020-10-18 DIAGNOSIS — Z20822 Contact with and (suspected) exposure to covid-19: Secondary | ICD-10-CM | POA: Diagnosis present

## 2020-10-18 DIAGNOSIS — K279 Peptic ulcer, site unspecified, unspecified as acute or chronic, without hemorrhage or perforation: Secondary | ICD-10-CM | POA: Diagnosis not present

## 2020-10-18 DIAGNOSIS — Z7901 Long term (current) use of anticoagulants: Secondary | ICD-10-CM | POA: Diagnosis not present

## 2020-10-18 DIAGNOSIS — D649 Anemia, unspecified: Secondary | ICD-10-CM | POA: Diagnosis not present

## 2020-10-18 DIAGNOSIS — E669 Obesity, unspecified: Secondary | ICD-10-CM | POA: Diagnosis present

## 2020-10-18 DIAGNOSIS — K219 Gastro-esophageal reflux disease without esophagitis: Secondary | ICD-10-CM

## 2020-10-18 DIAGNOSIS — K862 Cyst of pancreas: Secondary | ICD-10-CM | POA: Diagnosis present

## 2020-10-18 DIAGNOSIS — R112 Nausea with vomiting, unspecified: Secondary | ICD-10-CM | POA: Diagnosis not present

## 2020-10-18 DIAGNOSIS — K269 Duodenal ulcer, unspecified as acute or chronic, without hemorrhage or perforation: Secondary | ICD-10-CM | POA: Diagnosis not present

## 2020-10-18 DIAGNOSIS — Z91018 Allergy to other foods: Secondary | ICD-10-CM

## 2020-10-18 DIAGNOSIS — R109 Unspecified abdominal pain: Secondary | ICD-10-CM | POA: Diagnosis present

## 2020-10-18 DIAGNOSIS — I998 Other disorder of circulatory system: Secondary | ICD-10-CM | POA: Diagnosis present

## 2020-10-18 DIAGNOSIS — K274 Chronic or unspecified peptic ulcer, site unspecified, with hemorrhage: Secondary | ICD-10-CM | POA: Diagnosis not present

## 2020-10-18 DIAGNOSIS — K2971 Gastritis, unspecified, with bleeding: Secondary | ICD-10-CM | POA: Diagnosis not present

## 2020-10-18 HISTORY — PX: ESOPHAGOGASTRODUODENOSCOPY (EGD) WITH PROPOFOL: SHX5813

## 2020-10-18 LAB — CBC WITH DIFFERENTIAL/PLATELET
Abs Immature Granulocytes: 0.02 10*3/uL (ref 0.00–0.07)
Basophils Absolute: 0 10*3/uL (ref 0.0–0.1)
Basophils Relative: 1 %
Eosinophils Absolute: 0.1 10*3/uL (ref 0.0–0.5)
Eosinophils Relative: 2 %
HCT: 31.4 % — ABNORMAL LOW (ref 36.0–46.0)
Hemoglobin: 9.9 g/dL — ABNORMAL LOW (ref 12.0–15.0)
Immature Granulocytes: 0 %
Lymphocytes Relative: 21 %
Lymphs Abs: 1.4 10*3/uL (ref 0.7–4.0)
MCH: 27.6 pg (ref 26.0–34.0)
MCHC: 31.5 g/dL (ref 30.0–36.0)
MCV: 87.5 fL (ref 80.0–100.0)
Monocytes Absolute: 0.6 10*3/uL (ref 0.1–1.0)
Monocytes Relative: 10 %
Neutro Abs: 4.4 10*3/uL (ref 1.7–7.7)
Neutrophils Relative %: 66 %
Platelets: 208 10*3/uL (ref 150–400)
RBC: 3.59 MIL/uL — ABNORMAL LOW (ref 3.87–5.11)
RDW: 16.3 % — ABNORMAL HIGH (ref 11.5–15.5)
WBC: 6.6 10*3/uL (ref 4.0–10.5)
nRBC: 0 % (ref 0.0–0.2)

## 2020-10-18 LAB — C. DIFFICILE GDH AND TOXIN A/B
GDH ANTIGEN: NOT DETECTED
MICRO NUMBER:: 11575941
SPECIMEN QUALITY:: ADEQUATE
TOXIN A AND B: NOT DETECTED

## 2020-10-18 LAB — URINALYSIS, ROUTINE W REFLEX MICROSCOPIC
Bacteria, UA: NONE SEEN
Bilirubin Urine: NEGATIVE
Glucose, UA: NEGATIVE mg/dL
Hgb urine dipstick: NEGATIVE
Ketones, ur: NEGATIVE mg/dL
Nitrite: NEGATIVE
Protein, ur: NEGATIVE mg/dL
Specific Gravity, Urine: 1.013 (ref 1.005–1.030)
pH: 6 (ref 5.0–8.0)

## 2020-10-18 LAB — COMPREHENSIVE METABOLIC PANEL
ALT: 7 U/L (ref 0–44)
AST: 20 U/L (ref 15–41)
Albumin: 4 g/dL (ref 3.5–5.0)
Alkaline Phosphatase: 43 U/L (ref 38–126)
Anion gap: 8 (ref 5–15)
BUN: 16 mg/dL (ref 8–23)
CO2: 22 mmol/L (ref 22–32)
Calcium: 8.8 mg/dL — ABNORMAL LOW (ref 8.9–10.3)
Chloride: 107 mmol/L (ref 98–111)
Creatinine, Ser: 0.81 mg/dL (ref 0.44–1.00)
GFR, Estimated: >60 mL/min (ref >60)
Glucose, Bld: 121 mg/dL — ABNORMAL HIGH (ref 70–99)
Potassium: 3.9 mmol/L (ref 3.5–5.1)
Sodium: 137 mmol/L (ref 135–145)
Total Bilirubin: 0.6 mg/dL (ref 0.3–1.2)
Total Protein: 6.6 g/dL (ref 6.5–8.1)

## 2020-10-18 LAB — PHOSPHORUS: Phosphorus: 3.7 mg/dL (ref 2.5–4.6)

## 2020-10-18 LAB — LACTIC ACID, PLASMA
Lactic Acid, Venous: 1.1 mmol/L (ref 0.5–1.9)
Lactic Acid, Venous: 0.6 mmol/L (ref 0.5–1.9)

## 2020-10-18 LAB — GASTROINTESTINAL PATHOGEN PANEL PCR
C. difficile Tox A/B, PCR: NOT DETECTED
Campylobacter, PCR: NOT DETECTED
Cryptosporidium, PCR: NOT DETECTED
E coli (ETEC) LT/ST PCR: NOT DETECTED
E coli (STEC) stx1/stx2, PCR: NOT DETECTED
E coli 0157, PCR: NOT DETECTED
Giardia lamblia, PCR: NOT DETECTED
Norovirus, PCR: NOT DETECTED
Rotavirus A, PCR: NOT DETECTED
Salmonella, PCR: NOT DETECTED
Shigella, PCR: NOT DETECTED

## 2020-10-18 LAB — TISSUE TRANSGLUTAMINASE, IGA: (tTG) Ab, IgA: <1 U/mL

## 2020-10-18 LAB — TSH: TSH: 1.13 mIU/L (ref 0.40–4.50)

## 2020-10-18 LAB — TYPE AND SCREEN
ABO/RH(D): O NEG
Antibody Screen: NEGATIVE

## 2020-10-18 LAB — PROTIME-INR
INR: 1 (ref 0.8–1.2)
Prothrombin Time: 13 seconds (ref 11.4–15.2)

## 2020-10-18 LAB — RESP PANEL BY RT-PCR (FLU A&B, COVID) ARPGX2
Influenza A by PCR: NEGATIVE
Influenza B by PCR: NEGATIVE
SARS Coronavirus 2 by RT PCR: NEGATIVE

## 2020-10-18 LAB — ACETAMINOPHEN LEVEL: Acetaminophen (Tylenol), Serum: <10 ug/mL — ABNORMAL LOW (ref 10–30)

## 2020-10-18 LAB — IGA: Immunoglobulin A: 86 mg/dL (ref 70–320)

## 2020-10-18 LAB — POC OCCULT BLOOD, ED: Fecal Occult Bld: POSITIVE — AB

## 2020-10-18 LAB — MAGNESIUM: Magnesium: 2.1 mg/dL (ref 1.7–2.4)

## 2020-10-18 LAB — LIPASE, BLOOD: Lipase: 31 U/L (ref 11–51)

## 2020-10-18 SURGERY — ESOPHAGOGASTRODUODENOSCOPY (EGD) WITH PROPOFOL
Anesthesia: General

## 2020-10-18 MED ORDER — SODIUM CHLORIDE 0.9 % IV SOLN: INTRAVENOUS | Status: DC

## 2020-10-18 MED ORDER — STERILE WATER FOR IRRIGATION IR SOLN
Status: DC | PRN
  Administered 2020-10-18: 100 mL

## 2020-10-18 MED ORDER — LIDOCAINE HCL (CARDIAC) PF 100 MG/5ML IV SOSY
PREFILLED_SYRINGE | INTRAVENOUS | Status: DC | PRN
  Administered 2020-10-18: 50 mg via INTRAVENOUS

## 2020-10-18 MED ORDER — MORPHINE SULFATE (PF) 2 MG/ML IV SOLN
2.0000 mg | INTRAVENOUS | Status: DC | PRN
Start: 2020-10-18 — End: 2020-10-18

## 2020-10-18 MED ORDER — SUCRALFATE 1 GM/10ML PO SUSP
1.0000 g | Freq: Three times a day (TID) | ORAL | Status: DC
  Administered 2020-10-18 – 2020-10-23 (×18): 1 g via ORAL
  Filled 2020-10-18 (×17): qty 10

## 2020-10-18 MED ORDER — SODIUM CHLORIDE 0.9 % IV SOLN
INTRAVENOUS | Status: DC
  Administered 2020-10-18: 1000 mL via INTRAVENOUS

## 2020-10-18 MED ORDER — PROPOFOL 10 MG/ML IV BOLUS
INTRAVENOUS | Status: DC | PRN
  Administered 2020-10-18: 100 mg via INTRAVENOUS
  Administered 2020-10-18: 50 mg via INTRAVENOUS

## 2020-10-18 MED ORDER — SODIUM CHLORIDE 0.9 % IV SOLN
80.0000 mg | Freq: Once | INTRAVENOUS | Status: AC
  Administered 2020-10-18: 05:00:00 80 mg via INTRAVENOUS
  Filled 2020-10-18: qty 80

## 2020-10-18 MED ORDER — HYDROMORPHONE HCL 1 MG/ML IJ SOLN
0.5000 mg | INTRAMUSCULAR | Status: DC | PRN
  Administered 2020-10-18 – 2020-10-23 (×15): 0.5 mg via INTRAVENOUS
  Filled 2020-10-18 (×2): qty 0.5
  Filled 2020-10-18: qty 1
  Filled 2020-10-18 (×8): qty 0.5
  Filled 2020-10-18: qty 1
  Filled 2020-10-18 (×3): qty 0.5

## 2020-10-18 MED ORDER — ONDANSETRON HCL 4 MG/2ML IJ SOLN
4.0000 mg | Freq: Once | INTRAMUSCULAR | Status: AC
  Administered 2020-10-18: 4 mg via INTRAVENOUS
  Filled 2020-10-18: qty 2

## 2020-10-18 MED ORDER — SODIUM CHLORIDE 0.9 % IV SOLN
8.0000 mg/h | INTRAVENOUS | Status: AC
  Administered 2020-10-18 – 2020-10-21 (×5): 8 mg/h via INTRAVENOUS
  Filled 2020-10-18 (×9): qty 80

## 2020-10-18 MED ORDER — LACTATED RINGERS IV SOLN: INTRAVENOUS | Status: DC

## 2020-10-18 MED ORDER — HYDROMORPHONE HCL 1 MG/ML IJ SOLN
1.0000 mg | Freq: Once | INTRAMUSCULAR | Status: AC
  Administered 2020-10-18: 1 mg via INTRAVENOUS
  Filled 2020-10-18: qty 1

## 2020-10-18 MED ORDER — SODIUM CHLORIDE 0.9 % IV BOLUS
1000.0000 mL | Freq: Once | INTRAVENOUS | Status: AC
  Administered 2020-10-18: 1000 mL via INTRAVENOUS

## 2020-10-18 MED ORDER — ONDANSETRON HCL 4 MG/2ML IJ SOLN
4.0000 mg | Freq: Four times a day (QID) | INTRAMUSCULAR | Status: DC | PRN
  Administered 2020-10-18 – 2020-10-19 (×2): 4 mg via INTRAVENOUS
  Filled 2020-10-18 (×2): qty 2

## 2020-10-18 MED ORDER — PROPOFOL 500 MG/50ML IV EMUL
INTRAVENOUS | Status: DC | PRN
  Administered 2020-10-18: 150 ug/kg/min via INTRAVENOUS

## 2020-10-18 MED ORDER — LABETALOL HCL 5 MG/ML IV SOLN: 5.0000 mg | Freq: Four times a day (QID) | INTRAVENOUS | Status: DC | PRN

## 2020-10-18 NOTE — Plan of Care (Signed)

## 2020-10-18 NOTE — Brief Op Note (Signed)
10/18/2020  3:59 PM  PATIENT:  Maria Murphy  65 y.o. female  PRE-OPERATIVE DIAGNOSIS:  anemia, hitory PUD, abdominal pain, melena  POST-OPERATIVE DIAGNOSIS:  erosions in stomach; gastric ulcer; large duodenal ulcer in bulb; 2cm hiatal hernia;   PROCEDURE:  Procedure(s): ESOPHAGOGASTRODUODENOSCOPY (EGD) WITH PROPOFOL (N/A)  SURGEON:  Surgeon(s) and Role:    * Harvel Quale, MD - Primary Patient underwent esophagogastroduodenospy under propofol sedation.  There was presence of a 2 cm hiatal hernia. Stomach had presence of few gastric erosions that were not bleeding in the gastric body.  There was 1 superficial ulcer in the pylorus with clean base.  Evaluation of the duodenum showed presence of a large duodenal ulcer in the duodenal bulb, close to 35 mm in diameter which was semicircumferential.  There was no presence of stigmata of recent bleeding but there was presence of a suture at the bed of the ulcer.  The second portion of duodenum was normal with no presence of blood in the lumen.  RECOMMENDATIONS - Return patient to hospital ward for ongoing care.  - Clear liquid diet today.  - Continue PPI drip for 72 hours. - Check gastrin level in AM. - Start carafate 1 g QID  [name], MD Gastroenterology and Hepatology [facility]
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
## 2020-10-18 NOTE — Telephone Encounter (Signed)
Per Cyril Mourning patient is being admitted to the hospital now. She will let me know if anything changes but hopefully EGD can get done will she is admitted.

## 2020-10-18 NOTE — Transfer of Care (Signed)
Immediate Anesthesia Transfer of Care Note  Patient: Maria Murphy  Procedure(s) Performed: ESOPHAGOGASTRODUODENOSCOPY (EGD) WITH PROPOFOL (N/A )  Patient Location: PACU  Anesthesia Type:General  Level of Consciousness: drowsy  Airway & Oxygen Therapy: Patient Spontanous Breathing and Patient connected to nasal cannula oxygen  Post-op Assessment: Report given to RN and Post -op Vital signs reviewed and stable  Post vital signs: Reviewed and stable  Last Vitals:  Vitals Value Taken Time  BP 95/66 10/18/20 1545  Temp    Pulse 71 10/18/20 1546  Resp 12 10/18/20 1546  SpO2 100 % 10/18/20 1546  Vitals shown include unvalidated device data.  Last Pain:  Vitals:   10/18/20 1526  TempSrc:   PainSc: 5       Patients Stated Pain Goal: 7 (15/17/61 6073)  Complications: No complications documented.

## 2020-10-18 NOTE — Op Note (Addendum)
[facility] Patient Name: Maria Murphy Procedure Date: 10/18/2020 3:07 PM MRN: [id number] Date of Birth: May 06, 1956 Attending MD: [name] ,  CSN: [id number] Age: 65 Admit Type: Inpatient Procedure:                Upper GI endoscopy Indications:              Abdominal pain in the right upper quadrant, Melena Providers:                [name], Crystal Page, Aram Candela Referring MD:              Medicines:                Monitored Anesthesia Care Complications:            No immediate complications. Estimated Blood Loss:     Estimated blood loss: none. Procedure:                Pre-Anesthesia Assessment:                           - Prior to the procedure, a History and Physical                            was performed, and patient medications, allergies                            and sensitivities were reviewed. The patient's                            tolerance of previous anesthesia was reviewed.                           - The risks and benefits of the procedure and the                            sedation options and risks were discussed with the                            patient. All questions were answered and informed                            consent was obtained.                           - ASA Grade Assessment: III - A patient with severe                            systemic disease.                           After obtaining informed consent, the endoscope was                            passed under direct vision. Throughout the                            procedure,  the patient's blood pressure, pulse, and                            oxygen saturations were monitored continuously. The                            [id number]) was introduced through the mouth,                            and advanced to the second part of duodenum.                            Visualization was aided with the use of a                            transparent cap. The  upper GI endoscopy was                            accomplished without difficulty. The patient                            tolerated the procedure well. Scope In: 3:29:31 PM Scope Out: 3:40:24 PM Total Procedure Duration: 0 hours 10 minutes 53 seconds  Findings:      A 2 cm hiatal hernia was present.      A few localized 2 to 3 mm erosions with no stigmata of recent bleeding       were found in the gastric body.      One non-bleeding superficial gastric ulcer with a clean ulcer base       (Forrest Class III) was found at the pylorus. The lesion was 4 mm in       largest dimension.      One non-bleeding cratered duodenal ulcer with a clean ulcer base       (Forrest Class III) was found in the duodenal bulb. The lesion was 35 mm       in largest dimension and semicircumferential. There was signfiicant       associated edema around the ulcer. Notably, there was presence of a       suture passing proximal to the ulcer and entering the ulcer bed.      The second portion of the duodenum was normal. No blood was seen. Impression:               - Normal esophagus.                           - Erosive gastropathy with no stigmata of recent                            bleeding.                           - Non-bleeding gastric ulcer with a clean ulcer                            base (Forrest Class III).                           -  Non-bleeding duodenal ulcer with a clean ulcer                            base (Forrest Class III).                           - Normal second portion of the duodenum.                           - No specimens collected. Moderate Sedation:      Per Anesthesia Care Recommendation:           - Return patient to hospital ward for ongoing care.                           - Clear liquid diet today.                           - Continue PPI drip for 72 hours.                           - Check gastrin level in AM.                           - Start carafate 1 g QID. Procedure  Code(s):        --- Professional ---                           [phone], Esophagogastroduodenoscopy, flexible,                            transoral; diagnostic, including collection of                            specimen(s) by brushing or washing, when performed                            (separate procedure) Diagnosis Code(s):        --- Professional ---                           K31.89, Other diseases of stomach and duodenum                           K26.9, Duodenal ulcer, unspecified as acute or                            chronic, without hemorrhage or perforation                           R10.11, Right upper quadrant pain                           K92.1, Melena (includes Hematochezia) CPT copyright 2019 American Medical Association. All rights reserved. The codes documented in this report are preliminary and upon coder review may  be revised to meet current compliance requirements. [name], MD  [name],  10/18/2020 3:59:05 PM This report has been signed electronically. Number of Addenda: 0

## 2020-10-18 NOTE — ED Notes (Signed)
Pt complaining of abdominal pain.

## 2020-10-18 NOTE — Anesthesia Procedure Notes (Signed)
Date/Time: 10/18/2020 3:32 PM Performed by: Orlie Dakin, CRNA Pre-anesthesia Checklist: Patient identified, Emergency Drugs available, Suction available and Patient being monitored Patient Re-evaluated:Patient Re-evaluated prior to induction Oxygen Delivery Method: Nasal cannula Induction Type: IV induction Placement Confirmation: positive ETCO2

## 2020-10-18 NOTE — Consult Note (Signed)
Referring Provider: [facility] Primary Care Physician:  Ladell Pier, MD Primary Gastroenterologist:  Dr. Gala Romney  Date of Admission: 10/18/20 Date of Consultation: 10/18/20  Reason for Consultation:  Abdominal pain, GI bleed  HPI:  Maria Murphy is a 65 y.o. female with a past medical history of known peptic ulcer disease and duodenal ulcers, progressively enlarging pancreatic cystic lesion, previous perforated viscus secondary to gastric ulcer as well as pulmonary embolism now on Xarelto, hypertension, obesity, cholelithiasis.  She has been seeing our service as an outpatient.  Because of progressing right upper quadrant pain there is a discussion between GI and surgery.  Surgery felt less likely gallbladder issue and with the amount of inflammation that she would require cholecystostomy tube rather than cholecystectomy given the significant amount of area inflammation.  The decision was made to proceed with outpatient EGD within a week here and endoscopic ultrasound in [place] at the end of March for her cyst.  She was having progressive worsening of her abdominal pain, nausea, vomiting and decided to present to the emergency room.  She noted her pain was 10 out of 10, symptoms ongoing and progressive for the past 10 days, intermittent colicky pain.  She has been recommended to increase her dose of oxycodone and now is on 10 mg every 4 hours as well as Tylenol every 8 hours.  In the emergency department she endorsed black stools the night before which were loose as well as vomiting x1.  In the emergency department rectal exam revealed black stool that was heme positive.  Her hemoglobin was 11.12 days ago and is since declined to 9.9 as of yesterday.  Thankfully, no white count.  Normal kidney and liver function on CMP, lipase normal.  Lactic acid 1.1, INR 1.0.  SARS-CoV-2 negative.  A 2-view diagnostic abdomen and 1 view chest found moderate to large colonic stool burden with  increasing air distention of the stomach possibly related constipation, calcification again identified at at the level of pancreatic head, chest x-ray favoring atelectasis.  Today she states she's feeling better on medication. Pain improved with pain medicine, though pain is starting to come back. Nausea much better on medication. Started having black stools Wednesday night (1.5 days ago) and has had 10-15 episodes. Has vomited several times, last time around 4 am yesterday. No hematemesis or coffee-ground emesis (though admits she didn't look very much). Denies hematochezia. Pain epigastrum primarily, slightly toward the RUQ. No other overt GI complaints. Last oral consumption >8 hours ago. Last Xarelto yesterday morning about 24 hours ago).  Past Medical History:  Diagnosis Date  . Collagen vascular disease ([place])   . Essential hypertension, benign 01/24/2015  . Foot ulcer ([place])   . Hypertension   . Memory loss   . PUD (peptic ulcer disease)    with perforation s/p exploratory laparotomy  . Skin cancer   . Vision abnormalities     Past Surgical History:  Procedure Laterality Date  . CENTRAL LINE INSERTION Right 04/22/2020   Procedure: CENTRAL LINE INSERTION;  Surgeon: Virl Cagey, MD;  Location: [facility];  Service: General;  Laterality: Right;  . ESOPHAGOGASTRODUODENOSCOPY (EGD) WITH PROPOFOL N/A 07/10/2020   Procedure: ESOPHAGOGASTRODUODENOSCOPY (EGD) WITH PROPOFOL;  Surgeon: Rogene Houston, MD;  Location: [facility] END[facility] SUITE;  Service: Endoscopy;  Laterality: N/A;  . GASTRORRHAPHY  04/22/2020   Procedure: GASTRORRHAPHY;  Surgeon: Virl Cagey, MD;  Location: [facility];  Service: General;;  . IR CATHETER TUBE CHANGE  05/09/2020  . LAPAROTOMY  N/A 04/22/2020   Procedure: EXPLORATORY LAPAROTOMY;  Surgeon: Virl Cagey, MD;  Location: [facility];  Service: General;  Laterality: N/A;  . TONSILLECTOMY      Prior to Admission medications   Medication Sig Start Date End Date Taking?  Authorizing Provider  acetaminophen (TYLENOL) 500 MG tablet Take 1 tablet (500 mg total) by mouth every 6 (six) hours as needed for mild pain or fever. Patient taking differently: Take 500 mg by mouth as needed for mild pain or fever. 09/27/20   Johnson, Clanford L, MD  citalopram (CELEXA) 20 MG tablet Take 1 tablet (20 mg total) by mouth daily. 10/08/20   Ladell Pier, MD  diclofenac Sodium (VOLTAREN) 1 % GEL Apply 2 g topically 4 (four) times daily. 09/20/20   Ladell Pier, MD  dicyclomine (BENTYL) 10 MG capsule Take 1 capsule (10 mg total) by mouth 2 (two) times daily. 10/08/20   Ladell Pier, MD  donepezil (ARICEPT) 10 MG tablet Take 0.5-1 tablets (5-10 mg total) by mouth in the morning and at bedtime. 09/20/20   Ladell Pier, MD  gabapentin (NEURONTIN) 300 MG capsule Take 1 capsule (300 mg total) by mouth 2 (two) times daily. 10/08/20   Ladell Pier, MD  lipase/protease/amylase (CREON) 36000 UNITS CPEP capsule Take 2 capsules (72,000 Units total) by mouth 3 (three) times daily with meals. May also take 1 capsule (36,000 Units total) as needed (with snacks). 10/16/20   Erenest Rasher, PA-C  lisinopril-hydrochlorothiazide (ZESTORETIC) 10-12.5 MG tablet Take 1 tablet by mouth daily. 09/20/20   Ladell Pier, MD  mirtazapine (REMERON) 15 MG tablet Take 1 tablet (15 mg total) by mouth at bedtime. 10/08/20   Ladell Pier, MD  ondansetron (ZOFRAN ODT) 8 MG disintegrating tablet Take 1 tablet (8 mg total) by mouth every 8 (eight) hours as needed for nausea or vomiting. 10/16/20   Erenest Rasher, PA-C  oxyCODONE (ROXICODONE) 5 MG immediate release tablet 1 to 2 tablets (5-10 mg) by mouth as needed every 4 hours for pain 10/16/20   Horton, Barbette Hair, MD  pantoprazole (PROTONIX) 40 MG tablet Take 1 tablet (40 mg total) by mouth 2 (two) times daily. 09/20/20 10/20/20  Ladell Pier, MD  rivaroxaban (XARELTO) 20 MG TABS tablet Take 1 tablet (20 mg total) by mouth daily  with supper. To start after initial loading dose pack. 09/20/20   Ladell Pier, MD  sucralfate (CARAFATE) 1 GM/10ML suspension Take 10 mLs (1 g total) by mouth 4 (four) times daily -  with meals and at bedtime. 09/20/20   Ladell Pier, MD  traZODone (DESYREL) 100 MG tablet Take 1 tablet (100 mg total) by mouth at bedtime. 10/08/20   Ladell Pier, MD    Current Facility-Administered Medications  Medication Dose Route Frequency Provider Last Rate Last Admin  . 0.9 %  sodium chloride infusion   Intravenous Continuous Adefeso, Oladapo, DO 125 mL/hr at 10/18/20 0533 New Bag at 10/18/20 0533  . HYDROmorphone (DILAUDID) injection 0.5 mg  0.5 mg Intravenous Q4H PRN Adefeso, Oladapo, DO   0.5 mg at 10/18/20 0533  . labetalol (NORMODYNE) injection 5 mg  5 mg Intravenous Q6H PRN Adefeso, Oladapo, DO      . ondansetron (ZOFRAN) injection 4 mg  4 mg Intravenous Q6H PRN Adefeso, Oladapo, DO      . pantoprazole (PROTONIX) 80 mg in sodium chloride 0.9 % 100 mL (0.8 mg/mL) infusion  8 mg/hr Intravenous Continuous Adefeso, Oladapo,  DO 10 mL/hr at 10/18/20 0535 8 mg/hr at 10/18/20 0535   Current Outpatient Medications  Medication Sig Dispense Refill  . acetaminophen (TYLENOL) 500 MG tablet Take 1 tablet (500 mg total) by mouth every 6 (six) hours as needed for mild pain or fever. (Patient taking differently: Take 500 mg by mouth as needed for mild pain or fever.)    . citalopram (CELEXA) 20 MG tablet Take 1 tablet (20 mg total) by mouth daily. 30 tablet 2  . diclofenac Sodium (VOLTAREN) 1 % GEL Apply 2 g topically 4 (four) times daily. 100 g 2  . dicyclomine (BENTYL) 10 MG capsule Take 1 capsule (10 mg total) by mouth 2 (two) times daily. 60 capsule 2  . donepezil (ARICEPT) 10 MG tablet Take 0.5-1 tablets (5-10 mg total) by mouth in the morning and at bedtime. 30 tablet 2  . gabapentin (NEURONTIN) 300 MG capsule Take 1 capsule (300 mg total) by mouth 2 (two) times daily. 60 capsule 6  .  lipase/protease/amylase (CREON) 36000 UNITS CPEP capsule Take 2 capsules (72,000 Units total) by mouth 3 (three) times daily with meals. May also take 1 capsule (36,000 Units total) as needed (with snacks). 240 capsule 11  . lisinopril-hydrochlorothiazide (ZESTORETIC) 10-12.5 MG tablet Take 1 tablet by mouth daily. 90 tablet 1  . mirtazapine (REMERON) 15 MG tablet Take 1 tablet (15 mg total) by mouth at bedtime. 30 tablet 2  . ondansetron (ZOFRAN ODT) 8 MG disintegrating tablet Take 1 tablet (8 mg total) by mouth every 8 (eight) hours as needed for nausea or vomiting. 30 tablet 1  . oxyCODONE (ROXICODONE) 5 MG immediate release tablet 1 to 2 tablets (5-10 mg) by mouth as needed every 4 hours for pain 15 tablet 0  . pantoprazole (PROTONIX) 40 MG tablet Take 1 tablet (40 mg total) by mouth 2 (two) times daily. 60 tablet 0  . rivaroxaban (XARELTO) 20 MG TABS tablet Take 1 tablet (20 mg total) by mouth daily with supper. To start after initial loading dose pack. 30 tablet 2  . sucralfate (CARAFATE) 1 GM/10ML suspension Take 10 mLs (1 g total) by mouth 4 (four) times daily -  with meals and at bedtime. 420 mL 0  . traZODone (DESYREL) 100 MG tablet Take 1 tablet (100 mg total) by mouth at bedtime. 30 tablet 2    Allergies as of 10/18/2020 - Review Complete 10/18/2020  Allergen Reaction Noted  . Wheat extract Swelling 10/29/2017    Family History  Problem Relation Age of Onset  . High Cholesterol Mother   . Hypertension Mother   . Arthritis/Rheumatoid Mother   . Alcohol abuse Father   . Colon cancer Neg Hx   . Colon polyps Neg Hx   . Inflammatory bowel disease Neg Hx     Social History   Socioeconomic History  . Marital status: Widowed    Spouse name: Not on file  . Number of children: 2  . Years of education: Not on file  . Highest education level: Not on file  Occupational History  . Not on file  Tobacco Use  . Smoking status: Never Smoker  . Smokeless tobacco: Never Used  Vaping  Use  . Vaping Use: Never used  Substance and Sexual Activity  . Alcohol use: Yes    Alcohol/week: 1.0 standard drink    Types: 1 Glasses of wine per week    Comment: drank alcohol for past 2 weeks, one glass of wine. Prior to this would drink  glass of alcohol on weekends with dinner. 09/05/20: 3 glasses of wine a day  . Drug use: No  . Sexual activity: Not Currently    Birth control/protection: Post-menopausal  Other Topics Concern  . Not on file  Social History Narrative  . Not on file   Social Determinants of Health   Financial Resource Strain: Not on file  Food Insecurity: Not on file  Transportation Needs: Not on file  Physical Activity: Not on file  Stress: Not on file  Social Connections: Not on file  Intimate Partner Violence: Not on file    Review of Systems: General: Negative for anorexia, weight loss, fever, chills, fatigue, weakness. ENT: Negative for hoarseness, difficulty swallowing. CV: Negative for chest pain, angina, palpitations, peripheral edema.  Respiratory: Negative for dyspnea at rest, cough, sputum, wheezing.  GI: See history of present illness. MS: Negative for joint pain, low back pain.  Endo: Negative for unusual weight change.  Heme: Negative for bruising or bleeding. Allergy: Negative for rash or hives.  Physical Exam: Vital signs in last 24 hours: Temp:  [97.3 F (36.3 C)] 97.3 F (36.3 C) (02/25 0224) Pulse Rate:  [62-93] 69 (02/25 0630) Resp:  [11-33] 11 (02/25 0630) BP: (125-159)/(76-91) 131/78 (02/25 0630) SpO2:  [85 %-100 %] 94 % (02/25 0630) Weight:  [83.1 kg] 83.1 kg (02/25 0220)   General:   Alert,  Well-developed, well-nourished, pleasant and cooperative in NAD. Appears resting comfortably at this time. Head:  Normocephalic and atraumatic. Eyes:  Sclera clear, no icterus. Conjunctiva pink. Ears:  Normal auditory acuity. Neck:  Supple; no masses or thyromegaly. Lungs:  Clear throughout to auscultation.   No wheezes, crackles, or  rhonchi. No acute distress. Heart:  Regular rate and rhythm; no murmurs, clicks, rubs, or gallops. Abdomen:  Soft and nondistended. Noted TTP epigastric area and slightly to the right of that. No masses, hepatosplenomegaly or hernias noted. Normal bowel sounds, without guarding, and without rebound.   Rectal:  Deferred.   Msk:  Symmetrical without gross deformities. Pulses:  Normal bilateral DP pulses noted. Extremities:  Without clubbing or edema. Neurologic:  Alert and  oriented x4;  grossly normal neurologically. Psych:  Alert and cooperative. Normal mood and affect.  Intake/Output from previous day: No intake/output data recorded. Intake/Output this shift: No intake/output data recorded.  Lab Results: Recent Labs    10/16/20 0439 10/18/20 0319  WBC 6.6 6.6  HGB 11.1* 9.9*  HCT 34.9* 31.4*  PLT 199 208   BMET Recent Labs    10/16/20 0439 10/18/20 0319  NA 137 137  K 3.9 3.9  CL 106 107  CO2 22 22  GLUCOSE 116* 121*  BUN 21 16  CREATININE 0.72 0.81  CALCIUM 9.3 8.8*   LFT Recent Labs    10/16/20 0439 10/18/20 0319  PROT 7.1 6.6  ALBUMIN 4.1 4.0  AST 16 20  ALT 9 7  ALKPHOS 42 43  BILITOT 0.4 0.6   PT/INR Recent Labs    10/18/20 0319  LABPROT 13.0  INR 1.0   Hepatitis Panel No results for input(s): HEPBSAG, HCVAB, HEPAIGM, HEPBIGM in the last 72 hours. C-Diff No results for input(s): CDIFFTOX in the last 72 hours.  Studies/Results: DG Abdomen Acute W/Chest  Result Date: 10/18/2020 CLINICAL DATA:  Abdominal pain post surgery for perirectal abscess EXAM: DG ABDOMEN ACUTE WITH 1 VIEW CHEST COMPARISON:  Radiograph 10/16/2020, CT 10/15/2020 FINDINGS: Streaky basilar opacities and low volumes favoring atelectasis. No consolidation, features of edema, pneumothorax, or effusion.  Pulmonary vascularity is normally distributed. The cardiomediastinal contours are unremarkable. No subdiaphragmatic free air. Redemonstrated calcification at the level of the  pancreatic head corresponding to a lesion on comparison CT. There is increasing air distention of the stomach and mid abdominal small bowel with a moderate to large colonic stool burden. Few calcified gallstones are less well visualized on this exam. No acute osseous abnormality. Degenerative changes in the shoulders, spine, hips and pelvis. The osseous structures appear diffusely demineralized which may limit detection of small or nondisplaced fractures. No worrisome findings of the remaining soft tissues. IMPRESSION: 1. Moderate to large colonic stool burden with increasing air distention of the stomach and mid abdominal small bowel possibly related to constipation though could reflect an early or developing ileus in the appropriate clinical setting. No free air. 2. Calcification of the level of the pancreatic head corresponding to a pancreatic mass on comparison CT. 3. Lung volumes are low with streaky basilar opacities favoring atelectasis. Electronically Signed   By: Lovena Le M.D.   On: 10/18/2020 04:09    Impression: Very pleasant 65 year old female well-known to our service with a history of known peptic ulcer disease including gastric ulcers and duodenal ulcers, status post viscus perforation, cholelithiasis, enlarging pancreatic cystic mass concerning for possible neoplasm.  She has had intermittent and ongoing abdominal pain, nausea, vomiting.  She has been seen in the emergency department and admitted to the hospital 4 times already this year because of similar symptoms.  During all this she also developed pulmonary embolisms and has been placed on anticoagulation.  She presents today with worsening of her symptoms including likely upper GI bleed with noted melena and 1 g drop in hemoglobin over 2 days.  GI bleed: Concerning that she has had a 1 g drop over 2 days and her hemoglobin, black stool on digital rectal exam found to be heme positive and likely melanotic.  We were planning to complete  an outpatient EGD sometime within the next week but given her progression and new melanotic stools she will likely need sooner work-up at while admitted.  Differentials most likely due to known peptic ulcer disease.  Doubt recurrent viscus perforation.  Other potential differentials include simple erosions, gastritis, esophagitis, duodenitis, AVMs.  Abdominal pain with nausea and vomiting: There has been recent extensive discussion between GI and surgery.  I agree that her symptoms are most likely due to her known peptic ulcer disease rather than gallbladder symptoms.  If her eventual EGD is unremarkable and no GI findings to explain her symptoms then we can defer to surgery for possible cholecystostomy tube as a previously mentioned.  Plan: 1. Hold Xarelto 2. NPO 3. Monitor hgb closely 4. Transfuse as necessary 5. Pain and nausea management per hospitalist 6. Will discuss timing of EGD today versus tomorrow depending on last dose of Xarelto 7. PPI drip 8. Supportive measures   Thank you for allowing [place] to participate in the care of [facility]  Walden Field, DNP, AGNP-C Adult & Gerontological Nurse Practitioner [facility]   LOS: 0 days     10/18/2020, 7:57 AM

## 2020-10-18 NOTE — Anesthesia Postprocedure Evaluation (Signed)
Anesthesia Post Note  Patient: Maria Murphy  Procedure(s) Performed: ESOPHAGOGASTRODUODENOSCOPY (EGD) WITH PROPOFOL (N/A )  Patient location during evaluation: PACU Anesthesia Type: General Level of consciousness: awake Pain management: pain level controlled Vital Signs Assessment: post-procedure vital signs reviewed and stable Respiratory status: spontaneous breathing and respiratory function stable Cardiovascular status: blood pressure returned to baseline and stable Postop Assessment: no headache and no apparent nausea or vomiting Anesthetic complications: no   No complications documented.   Last Vitals:  Vitals:   10/18/20 1545 10/18/20 1600  BP: 95/66 111/78  Pulse: 73 (!) 58  Resp: 11 (!) 25  Temp: 36.8 C   SpO2: 100% 100%    Last Pain:  Vitals:   10/18/20 1600  TempSrc:   PainSc: [place]

## 2020-10-18 NOTE — Telephone Encounter (Signed)
Pt is admitted. Per Aliene Altes, PA, pt was notified of information listed.

## 2020-10-18 NOTE — Progress Notes (Signed)
PROGRESS NOTE  Maria Murphy WER:[id number] DOB: Oct 12, 1955 DOA: 10/18/2020 PCP: Ladell Pier, MD  Brief History:  65 year old female with history of hypertension, peptic ulcer disease with perforation, saddle pulmonary embolus on rivaroxaban, depression presenting with 10-day history of intermittent abdominal pain and nausea and vomiting.  The patient states that she has had approximately 2-3 episodes of emesis daily with upper abdominal pain in the epigastric and left upper quadrant region.  She denies any fevers, chills, chest pain, shortness breath, coughing, hemoptysis.  She has not had any hematemesis.  She has had loose stools with occasional melena. The patient has had 2 recent emergency department visits.  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  There was also more prominent main pancreatic duct.  There is also an inflamed appearance of the distal stomach and proximal duodenum with adjacent fat stranding that was increased since 09/24/2020.  There is mild inflamed appearance of the gallbladder and porta hepatis.  The patient was discharged home in stable condition after fluids and conservative care in the emergency department with a prescription for oxycodone. The patient endorses compliance with her Protonix and Carafate.  The patient states that she has not taken any NSAIDs recently.  She endorses compliance with her rivaroxaban. Notably, the patient has had a complicated history of peptic ulcer disease secondary to NSAID use.  She had a prolonged hospitalization from 04/21/2020 to 05/17/2020 secondary to a perforated gastric ulcer.  She underwent exploratory laparotomy with Phillip Heal patch placed on 04/22/2021.  This was complicated by intra-abdominal abscess for which required IR aspiration.  That hospitalization was also complicated by a pulmonary embolus for which the  patient was started on rivaroxaban.  She was subsequently remitted to the hospital on 07/05/2020 to 07/12/2020 for intractable nausea and vomiting.  Fortunately, the patient improved with conservative/nonoperative management.  She underwent EGD on 07/10/2020 which showed a scar in the gastric antrum and prepyloric region.  There was nonbleeding duodenal ulcers.  Once again, the patient was readmitted from 09/23/2020 to 09/27/20 for abdominal pain and intractable nausea and vomiting.  Again, the patient improved with conservative care.  In the emergency department, the patient was afebrile hemodynamically stable with oxygen saturation 100% room air.  Sodium was 137, potassium 3.9, serum creatinine 0.81.  LFTs were unremarkable.  Lipase was 31.  WBC 6.6, hemoglobin 9.9, platelets 208,000.  The patient was started on a Protonix drip.  GI was consulted to assist with management.  Assessment/Plan: Intractable abdominal pain/vomiting/melena -Suspect this is secondary to her peptic ulcer disease -Continue Protonix drip -GI consult -Remain n.p.o. for now pending GI evaluation and possible endoscopy -Avoid NSAIDs -Holding rivaroxaban temporarily -10/15/2020 CT abdomen--as discussed above -10/18/20 AXR--mod to large stool burden with increase air distension of stomach to mid SB  Inflamed gallbladder appearance -09/18/2020 HIDA scan normal -Previously seen by surgery who did not feel that her clinical presentation was consistent with cholecystitis  Pancreatic head mass -This has been followed by surveillance by GI  ABLA -partly due to IVF given from prior ED visits -baseline Hgb 10-11  History of pulmonary embolus -Patient had submassive/saddle embolus on CTA on 05/01/2020 -Temporarily holding rivaroxaban  Essential hypertension -Holding lisinopril HCTZ for now -Monitor BP -IV labetalol as needed SBP >180  Depression -Restart Celexa and trazodone once able to tolerate p.o. -Restart Remeron once  able to tolerate p.o.      Status is: Inpatient  Remains inpatient appropriate because:IV treatments appropriate due to intensity of illness or inability to take PO   Dispo: The patient is from: Home              Anticipated d/c is to: Home              Patient currently is not medically stable to d/c.   Difficult to place patient No        Family Communication:  no Family at bedside  Consultants:  GI  Code Status:  FULL  DVT Prophylaxis:  Xarelto on hold   Procedures: As Listed in Progress Note Above  Antibiotics: None       Subjective: Patient complains of epigastric and left upper quadrant pain.  She states that the pain is controlled with IV opioids.  She has not had any vomiting or diarrhea in the emergency department.  She denies any fevers, chills, chest pain, shortness breath,, coughing, hemoptysis.  She has had some melena.  Objective: Vitals:   10/18/20 0400 10/18/20 0430 10/18/20 0500 10/18/20 0630  BP: (!) 143/76 (!) 159/91 (!) 150/90 131/78  Pulse: 69 62 65 69  Resp: 16 15 13 11   Temp:      TempSrc:      SpO2: 95% 96% 97% 94%  Weight:      Height:       No intake or output data in the 24 hours ending 10/18/20 0720 Weight change:  Exam:   General:  Pt is alert, follows commands appropriately, not in acute distress  HEENT: No icterus, No thrush, No neck mass, [place]/AT  Cardiovascular: RRR, S1/S2, no rubs, no gallops  Respiratory: CTA bilaterally, no wheezing, no crackles, no rhonchi  Abdomen: Soft/+BS, LUQ tender, non distended, no guarding  Extremities: No edema, No lymphangitis, No petechiae, No rashes, no synovitis   Data Reviewed: I have personally reviewed following labs and imaging studies Basic Metabolic Panel: Recent Labs  Lab 10/15/20 0219 10/16/20 0439 10/18/20 0319  NA 136 137 137  K 4.1 3.9 3.9  CL 104 106 107  CO2 23 22 22   GLUCOSE 116* 116* 121*  BUN 20 21 16   CREATININE 0.84 0.72 0.81  CALCIUM 9.5 9.3  8.8*  MG  --   --  2.1  PHOS  --   --  3.7   Liver Function Tests: Recent Labs  Lab 10/15/20 0219 10/16/20 0439 10/18/20 0319  AST 21 16 20   ALT 8 9 7   ALKPHOS 47 42 43  BILITOT 0.6 0.4 0.6  PROT 7.9 7.1 6.6  ALBUMIN 4.6 4.1 4.0   Recent Labs  Lab 10/15/20 0219 10/16/20 0439 10/18/20 0319  LIPASE 41 30 31   No results for input(s): AMMONIA in the last 168 hours. Coagulation Profile: Recent Labs  Lab 10/18/20 0319  INR 1.0   CBC: Recent Labs  Lab 10/15/20 0219 10/16/20 0439 10/18/20 0319  WBC 6.9 6.6 6.6  NEUTROABS  --  5.0 4.4  HGB 12.2 11.1* 9.9*  HCT 37.2 34.9* 31.4*  MCV 85.5 86.2 87.5  PLT 226 199 208   Cardiac Enzymes: No results for input(s): CKTOTAL, CKMB, CKMBINDEX, TROPONINI in the last 168 hours. BNP: Invalid input(s): POCBNP CBG: No results for input(s): GLUCAP in the last 168 hours. HbA1C: No results for input(s): HGBA1C in the last 72 hours. Urine analysis:    Component Value Date/Time   COLORURINE YELLOW 09/24/2020 0423  APPEARANCEUR CLEAR 09/24/2020 0423   LABSPEC 1.044 (H) 09/24/2020 0423   PHURINE 6.0 09/24/2020 0423   GLUCOSEU NEGATIVE 09/24/2020 0423   HGBUR NEGATIVE 09/24/2020 0423   BILIRUBINUR NEGATIVE 09/24/2020 0423   KETONESUR 20 (A) 09/24/2020 0423   PROTEINUR NEGATIVE 09/24/2020 0423   NITRITE NEGATIVE 09/24/2020 0423   LEUKOCYTESUR TRACE (A) 09/24/2020 0423   Sepsis Labs: @LABRCNTIP (procalcitonin:4,lacticidven:4) ) Recent Results (from the past 240 hour(s))  Resp Panel by RT-PCR (Flu A&B, Covid) Nasopharyngeal Swab     Status: None   Collection Time: 10/18/20  3:52 AM   Specimen: Nasopharyngeal Swab; Nasopharyngeal(NP) swabs in vial transport medium  Result Value Ref Range Status   SARS Coronavirus 2 by RT PCR NEGATIVE NEGATIVE Final    Comment: (NOTE) SARS-CoV-2 target nucleic acids are NOT DETECTED.  The SARS-CoV-2 RNA is generally detectable in upper respiratory specimens during the acute phase of  infection. The lowest concentration of SARS-CoV-2 viral copies this assay can detect is 138 copies/mL. A negative result does not preclude SARS-Cov-2 infection and should not be used as the sole basis for treatment or other patient management decisions. A negative result may occur with  improper specimen collection/handling, submission of specimen other than nasopharyngeal swab, presence of viral mutation(s) within the areas targeted by this assay, and inadequate number of viral copies(<138 copies/mL). A negative result must be combined with clinical observations, patient history, and epidemiological information. The expected result is Negative.  Fact Sheet for Patients:  EntrepreneurPulse.com.au  Fact Sheet for Healthcare Providers:  IncredibleEmployment.be  This test is no t yet approved or cleared by the [place] FDA and  has been authorized for detection and/or diagnosis of SARS-CoV-2 by FDA under an Emergency Use Authorization (EUA). This EUA will remain  in effect (meaning this test can be used) for the duration of the COVID-19 declaration under Section 564(b)(1) of the Act, 21 U.S.C.section 360bbb-3(b)(1), unless the authorization is terminated  or revoked sooner.       Influenza A by PCR NEGATIVE NEGATIVE Final   Influenza B by PCR NEGATIVE NEGATIVE Final    Comment: (NOTE) The Xpert Xpress SARS-CoV-2/FLU/RSV plus assay is intended as an aid in the diagnosis of influenza from Nasopharyngeal swab specimens and should not be used as a sole basis for treatment. Nasal washings and aspirates are unacceptable for Xpert Xpress SARS-CoV-2/FLU/RSV testing.  Fact Sheet for Patients: EntrepreneurPulse.com.au  Fact Sheet for Healthcare Providers: IncredibleEmployment.be  This test is not yet approved or cleared by the [place] FDA and has been authorized for detection and/or diagnosis of SARS-CoV-2  by FDA under an Emergency Use Authorization (EUA). This EUA will remain in effect (meaning this test can be used) for the duration of the COVID-19 declaration under Section 564(b)(1) of the Act, 21 U.S.C. section 360bbb-3(b)(1), unless the authorization is terminated or revoked.  Performed at [facility], [address], [place], [place] [address]      Scheduled Meds: Continuous Infusions: . sodium chloride 125 mL/hr at 10/18/20 0533  . pantoprozole (PROTONIX) infusion 8 mg/hr (10/18/20 0535)    Procedures/Studies: CT ABDOMEN PELVIS W CONTRAST  Result Date: 10/15/2020 CLINICAL DATA:  65 year old female with abdominal pain for 6 days. History of exploratory laparotomy, perforated gastric ulcer in August 2021. EXAM: CT ABDOMEN AND PELVIS WITH CONTRAST TECHNIQUE: Multidetector CT imaging of the abdomen and pelvis was performed using the standard protocol following bolus administration of intravenous contrast. CONTRAST:  141mL OMNIPAQUE IOHEXOL 300 MG/ML  SOLN COMPARISON:  CT Abdomen and  Pelvis 09/24/2020 and earlier, including MRI 04/22/2020. FINDINGS: Lower chest: Stable lung bases and cardiac size at the upper limits of normal. No pericardial or pleural effusion. Hepatobiliary: Chronic cholelithiasis with gallstones individually up to 19 mm. Fat at the porta hepatis and the gallbladder wall remain indistinct similar to earlier this month. No progressive gallbladder wall thickening. No intrahepatic biliary ductal dilatation. Stable liver enhancement including tiny low-density area near the dome on the right which has a benign appearance. No perihepatic free fluid. Pancreas: Multi cystic, partially calcified mass of the pancreatic head measuring up to 43 mm long axis (series 2, image 29) appears larger since August 2021 by up to 10 mm. Main pancreatic duct also appears more prominent since that time. But there is no convincing primary pancreatic inflammation. No free fluid in the lesser sac. Spleen:  Negative. Adrenals/Urinary Tract: Normal adrenal glands. Both kidneys are stable since August including mildly complex partially calcified exophytic right upper pole cystic lesion - which showed no suspicious enhancement on August MRI. Superimposed benign appearing parapelvic and left renal midpole cysts. Normal renal contrast excretion on delayed images. No perinephric stranding. No definite nephrolithiasis. Decompressed ureters. Negative urinary bladder. Chronic pelvic phleboliths. Stomach/Bowel: Negative rectum. Extensive sigmoid diverticulosis with no active inflammation. Redundant transverse colon. Retained stool in the transverse and right colon. Cecum is on a lax mesentery located in the right mid abdomen. Normal appendix visible on series 5, image 30. No large bowel inflammation. Negative terminal ileum. No dilated small bowel. Small gastric hiatal hernia. No free air. No free fluid identified. But there remains an indistinct appearance of the gastric antrum and proximal duodenum (series 2, image 24 and series 5, image 24) with increased mesenteric stranding from earlier this month. Regional mucosal hyperenhancement and wall thickening. No extraluminal gas identified. Vascular/Lymphatic: Major arterial structures in the abdomen and pelvis are patent and mildly tortuous. Mild Aortoiliac calcified atherosclerosis. Portal venous system is patent. No lymphadenopathy. Reproductive: Lobulated, fibroid uterus including multiple exophytic, subserosal fibroids anteriorly and at the fundus which are individually up to 37 mm diameter and not significantly changed since 04/20/2020. Adnexa appear stable and within normal limits. Other: No pelvic free fluid. Chronic postoperative changes to the ventral upper abdomen are stable. Musculoskeletal: Intermittent advanced disc and endplate degeneration in the visible spine. Grade 1 lumbar spondylolisthesis. No acute osseous abnormality identified. IMPRESSION: 1. 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 This was evaluated by MRI in August 2021 (please see that report), and appears mildly larger (4.3 cm) with increased conspicuity of the pancreatic duct since that time. 2. Continued inflamed appearance of the distal stomach and proximal duodenum compatible with Recurrent Acute Peptic Ulcer Disease. Adjacent mesenteric fat stranding seems increased since 09/24/2020, although there is no evidence of perforation. 3. Persistent mildly inflamed appearance of the gallbladder and porta hepatis region also, with underlying Cholelithiasis. 4. Otherwise stable abdomen and pelvis, including renal cysts which had a benign MRI appearance, sigmoid diverticulosis, fibroid uterus. Electronically Signed   By: Genevie Ann M.D.   On: 10/15/2020 04:42   CT Abdomen Pelvis W Contrast  Result Date: 09/24/2020 CLINICAL DATA:  Nausea, vomiting and diarrhea for 2 days EXAM: CT ABDOMEN AND PELVIS WITH CONTRAST TECHNIQUE: Multidetector CT imaging of the abdomen and pelvis was performed using the standard protocol following bolus administration of intravenous contrast. CONTRAST:  140mL OMNIPAQUE IOHEXOL 300 MG/ML  SOLN COMPARISON:  CT 08/07/2020 04/22/2020 FINDINGS: Lower chest: Atelectatic changes in the wrist clear lung bases. Cardiac size  is top normal. No pericardial effusion. Few coronary artery calcifications. Hepatobiliary: Few subcentimeter hypoattenuating foci in the liver too small to fully characterize on CT imaging but statistically likely benign. No concerning focal liver lesions. Smooth liver surface contour. Normal liver attenuation. Gallbladder contains multiple partially calcified gallstones several of which appear partially pneumatized as well. There is some increasing pericholecystic inflammatory change when compared to prior imaging. No visible intraductal gallstones or biliary ductal dilatation is seen however. Pancreas: Redemonstration of the multilobulated cystic  lesion in the head of the pancreas with few punctate calcifications again measuring up to 3.5 by 4.0 cm in maximum transaxial dimension, not significantly changed from comparison studies. Previously characterized on prior MR imaging 04/22/2020 with a differential including mucinous cystic neoplasm versus sequela of prior pancreatitis. Remaining portions of the pancreas are unremarkable without new or concerning pancreatic lesion, ductal dilatation, or peripancreatic inflammation. Spleen: Normal in size. No concerning splenic lesions. Adrenals/Urinary Tract: Normal adrenal glands. Kidneys are normal in size and position. Multiple simple and parapelvic cysts are noted bilaterally. A slightly more complex, partially exophytic peripherally calcified cystic lesion measuring up to 1.8 cm is seen arising from the upper pole right kidney also previously characterized on prior MR imaging is a complex though likely benign cyst. No new or concerning renal lesions. No frank hydronephrosis or obstructive urolithiasis. There is some mild faint perivesicular hazy stranding and some questionable layering debris versus asymmetric thickening of the left posterolateral bladder wall. Stomach/Bowel: Small sliding-type hiatal hernia. Proximal stomach is unremarkable. There is a circumferentially thickened appearance of the duodenal fold and gastric antrum. Mild mucosal hyperemia and possible ulceration (5/23) is present as well. More distal duodenum is unremarkable. No other small bowel thickening or dilatation. A normal appendix is visualized. No colonic dilatation or wall thickening. Scattered colonic diverticula without focal inflammation to suggest diverticulitis. Relative lack of formed stool could reflect a rapid transit state/diarrheal illness. Vascular/Lymphatic: Atherosclerotic calcification in the iliac arteries. Other significant vascular findings. No suspicious or enlarged lymph nodes in the included lymphatic chains.  Reproductive: Lobular, heterogeneously enhancing lesions with uterus including both intramural and partially exophytic foci keeping with a diagnosis of uterine leiomyomata. No concerning adnexal lesions. Other: No abdominopelvic free fluid or free gas. No bowel containing hernias. Prior vertical midline incision. Mild circumferential body wall edema most pronounced posteriorly. Musculoskeletal: Multilevel degenerative changes are present in the imaged portions of the spine. More sclerotic changes about a region of disc desiccation at the T11-12 level is similar to prior and favored to be on a degenerative basis. Grade 1 anterolisthesis L4 on 5 without associated spondylolysis. 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 Additional moderate degenerative changes in the hips and pelvis. No acute or conspicuous osseous lesions. IMPRESSION: 1. Circumferentially thickened appearance of the gastric antrum and duodenal bulb with some anemia mucosal hyperemia which could reflect some gastritis/duodenitis with a site of possible ulceration. No evidence of frank perforation or abscess. Correlate with endoscopy as clinically warranted. 2. Cholelithiasis with some slightly increasing pericholecystic inflammatory change when compared to prior imaging. If there is clinical concern for acute cholecystitis, recommend further evaluation with right upper quadrant ultrasound. 3. Relative lack of formed stool could reflect a rapid transit state/diarrheal illness. 4. Unchanged appearance of the multilobulated cystic lesion in the head of the pancreas with few punctate calcifications. Previously characterized on prior MR imaging 04/22/2020 with a differential including mucinous cystic neoplasm versus sequela of prior pancreatitis. 5. Minimally complex exophytic cystic  lesion from the right kidney, previously characterized on MR imaging is a probable benign cyst. Additional fluid attenuation cysts  in both kidneys. Acute urinary tract abnormality. 6. Fibroid uterus. 7. Aortic Atherosclerosis (ICD10-I70.0). Electronically Signed   By: Lovena Le M.D.   On: 09/24/2020 02:36   NM Hepato W/EjeCT Fract  Result Date: 09/18/2020 CLINICAL DATA:  Right quadrant pain and epigastric pain for 6 weeks. Postprandial nausea. Cholelithiasis. EXAM: NUCLEAR MEDICINE HEPATOBILIARY IMAGING WITH GALLBLADDER EF TECHNIQUE: Sequential images of the abdomen were obtained out to 60 minutes following intravenous administration of radiopharmaceutical. After oral ingestion of Ensure, gallbladder ejection fraction was determined. At 60 min, normal ejection fraction is greater than 33%. RADIOPHARMACEUTICALS:  5.3 mCi Tc-88m  Choletec IV COMPARISON:  None. FINDINGS: Prompt uptake and biliary excretion of activity by the liver is seen. Gallbladder activity is visualized, consistent with patency of cystic duct. Biliary activity passes into small bowel, consistent with patent common bile duct. Calculated gallbladder ejection fraction is 47%. (Normal gallbladder ejection fraction with Ensure is greater than 33%.) IMPRESSION: Normal hepatobiliary scan, demonstrating patency of cystic and common bile ducts. Normal gallbladder ejection fraction. Electronically Signed   By: Marlaine Hind M.D.   On: 09/18/2020 14:44   DG Chest Port 1 View  Result Date: 09/24/2020 CLINICAL DATA:  Shortness of breath and cough EXAM: PORTABLE CHEST 1 VIEW COMPARISON:  05/02/2020 FINDINGS: Cardiac shadow is stable. Lungs are well aerated bilaterally. No focal infiltrate or sizable effusion is seen. No bony abnormality is noted. IMPRESSION: No active disease. Electronically Signed   By: Inez Catalina M.D.   On: 09/24/2020 01:25   DG Abdomen Acute W/Chest  Result Date: 10/18/2020 CLINICAL DATA:  Abdominal pain post surgery for perirectal abscess EXAM: DG ABDOMEN ACUTE WITH 1 VIEW CHEST COMPARISON:  Radiograph 10/16/2020, CT 10/15/2020 FINDINGS: Streaky basilar  opacities and low volumes favoring atelectasis. No consolidation, features of edema, pneumothorax, or effusion. Pulmonary vascularity is normally distributed. The cardiomediastinal contours are unremarkable. No subdiaphragmatic free air. Redemonstrated calcification at the level of the pancreatic head corresponding to a lesion on comparison CT. There is increasing air distention of the stomach and mid abdominal small bowel with a moderate to large colonic stool burden. Few calcified gallstones are less well visualized on this exam. No acute osseous abnormality. Degenerative changes in the shoulders, spine, hips and pelvis. The osseous structures appear diffusely demineralized which may limit detection of small or nondisplaced fractures. No worrisome findings of the remaining soft tissues. IMPRESSION: 1. Moderate to large colonic stool burden with increasing air distention of the stomach and mid abdominal small bowel possibly related to constipation though could reflect an early or developing ileus in the appropriate clinical setting. No free air. 2. Calcification of the level of the pancreatic head corresponding to a pancreatic mass on comparison CT. 3. Lung volumes are low with streaky basilar opacities favoring atelectasis. Electronically Signed   By: Lovena Le M.D.   On: 10/18/2020 04:09   DG Abdomen Acute W/Chest  Result Date: 10/16/2020 CLINICAL DATA:  Abdominal pain, change EXAM: DG ABDOMEN ACUTE WITH 1 VIEW CHEST COMPARISON:  CT 10/15/2020, ultrasound 09/24/2020, radiograph 09/24/2020 FINDINGS: Some mildly coarsened interstitial changes are similar to prior with basilar atelectasis. No consolidation, features of edema, pneumothorax, or effusion. Unchanged cardiomediastinal contours. No subdiaphragmatic free air. No high-grade obstructive bowel gas pattern. Moderate colonic stool burden. Redemonstration of the calcification at the level of the pancreatic head associated with a cystic pancreatic neoplasm  on comparison CT  imaging. Few calcified gallstones are noted as well. Vascular calcium of the abdomen and pelvis. Few phleboliths in the deep pelvis. Extensive degenerative changes in the shoulders, spine, hips and pelvis. The osseous structures appear diffusely demineralized which may limit detection of small or nondisplaced fractures. No acute osseous abnormality or suspicious osseous lesion. IMPRESSION: 1. 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 No acute cardiopulmonary abnormality. 2. Nonobstructive bowel gas pattern. Moderate colonic stool burden. 3. Calcification associated with the pancreatic lesion on comparison CT. Partly visualized gallstones as well. Electronically Signed   By: Lovena Le M.D.   On: 10/16/2020 05:26   US ABDOMEN LIMITED RUQ (LIVER/GB)  Result Date: 09/24/2020 CLINICAL DATA:  Abdominal pain.  History of gallstones. EXAM: ULTRASOUND ABDOMEN LIMITED RIGHT UPPER QUADRANT COMPARISON:  Ultrasound 07/28/2020.  CT 07/28/2020. FINDINGS: Gallbladder: Gallstones again noted. Largest measures 1.6 cm. Gallbladder wall is minimally prominent at 3 mm. Trace pericholecystic fluid. Negative Murphy sign. Common bile duct: Diameter: 6 mm Liver: Increased echogenicity consistent fatty infiltration or hepatocellular disease. Portal vein is patent on color Doppler imaging with normal direction of blood flow towards the liver. Other: None. IMPRESSION: 1. Gallstones again noted. Largest measures 1.6 cm. Gallbladder wall is minimally prominent at 3 mm. Trace pericholecystic fluid. Cholecystitis cannot be excluded. Negative Murphy sign. No biliary distention. 2. Increased echogenicity of the liver consistent fatty infiltration or hepatocellular disease. Electronically Signed   By: Marcello Moores  Register   On: 09/24/2020 09:15    Orson Eva, DO  [facility]  If 7PM-7AM, please contact night-coverage [email or web address] Password [email or web address] 10/18/2020, 7:21 AM   LOS: 0 days

## 2020-10-18 NOTE — ED Triage Notes (Signed)
Pt c/o continued abd pain. Pt seen here recently 2 times for the same. Pt states she was given dilaudid here but only sent home with oxycodone.

## 2020-10-18 NOTE — Anesthesia Preprocedure Evaluation (Signed)
Anesthesia Evaluation  Patient identified by MRN, date of birth, ID band Patient awake    Reviewed: Allergy & Precautions, H&P , NPO status , Patient's Chart, lab work & pertinent test results, reviewed documented beta blocker date and time   Airway Mallampati: II  TM Distance: >3 FB Neck ROM: full    Dental no notable dental hx.    Pulmonary neg pulmonary ROS,    Pulmonary exam normal breath sounds clear to auscultation       Cardiovascular Exercise Tolerance: Good hypertension, negative cardio ROS   Rhythm:regular Rate:Normal     Neuro/Psych PSYCHIATRIC DISORDERS Depression  Neuromuscular disease    GI/Hepatic Neg liver ROS, PUD, GERD  Medicated,  Endo/Other  negative endocrine ROS  Renal/GU Renal disease  negative genitourinary   Musculoskeletal   Abdominal   Peds  Hematology negative hematology ROS (+)   Anesthesia Other Findings   Reproductive/Obstetrics negative OB ROS                             Anesthesia Physical Anesthesia Plan  ASA: III and emergent  Anesthesia Plan: General   Post-op Pain Management:    Induction:   PONV Risk Score and Plan: Propofol infusion  Airway Management Planned:   Additional Equipment:   Intra-op Plan:   Post-operative Plan:   Informed Consent: I have reviewed the patients History and Physical, chart, labs and discussed the procedure including the risks, benefits and alternatives for the proposed anesthesia with the patient or authorized representative who has indicated his/her understanding and acceptance.     Dental Advisory Given  Plan Discussed with: CRNA  Anesthesia Plan Comments:         Anesthesia Quick Evaluation

## 2020-10-18 NOTE — Consult Note (Signed)
Reason for Consult: Abdominal pain Referring Physician: Dr. Barrie Dunker is an 65 y.o. female.  HPI: Maria Murphy is a 65 year old female well-known to our service, status post exploratory laparotomy and Phillip Heal plication in August 0865 who postoperatively developed a saddle embolus for which she has been anticoagulated since that time.  She has had multiple admissions since that time for epigastric and right upper quadrant abdominal pain.  She has a known history of a developing pancreatic cyst.  She has a known history of cholelithiasis.  She has exhibited drug-seeking behavior.  Given her recent history and upper abdominal surgery, we have been hesitant to do any operative intervention for her gallstones.  It has been felt that should she have cholecystitis, a cholecystostomy tube may be the best option at the present time.  This admission, she has had worsening abdominal pain, nausea, vomiting, and blood per rectum.  She did have black tarry stools that were Hemoccult positive.  Gastroenterology has seen her and I going to do an EGD.  When I saw her in the emergency room, she stated her epigastric to right upper quadrant abdominal pain had almost totally resolved.  She denies any fever, chills, jaundice.  Past Medical History:  Diagnosis Date  . Collagen vascular disease ([place])   . Essential hypertension, benign 01/24/2015  . Foot ulcer ([place])   . Hypertension   . Memory loss   . PUD (peptic ulcer disease)    with perforation s/p exploratory laparotomy  . Skin cancer   . Vision abnormalities     Past Surgical History:  Procedure Laterality Date  . CENTRAL LINE INSERTION Right 04/22/2020   Procedure: CENTRAL LINE INSERTION;  Surgeon: Virl Cagey, MD;  Location: [facility];  Service: General;  Laterality: Right;  . ESOPHAGOGASTRODUODENOSCOPY (EGD) WITH PROPOFOL N/A 07/10/2020   Procedure: ESOPHAGOGASTRODUODENOSCOPY (EGD) WITH PROPOFOL;  Surgeon: Rogene Houston, MD;  Location: [facility];  Service: Endoscopy;  Laterality: N/A;  . GASTRORRHAPHY  04/22/2020   Procedure: GASTRORRHAPHY;  Surgeon: Virl Cagey, MD;  Location: [facility];  Service: General;;  . IR CATHETER TUBE CHANGE  05/09/2020  . LAPAROTOMY N/A 04/22/2020   Procedure: EXPLORATORY LAPAROTOMY;  Surgeon: Virl Cagey, MD;  Location: [facility];  Service: General;  Laterality: N/A;  . TONSILLECTOMY      Family History  Problem Relation Age of Onset  . High Cholesterol Mother   . Hypertension Mother   . Arthritis/Rheumatoid Mother   . Alcohol abuse Father   . Colon cancer Neg Hx   . Colon polyps Neg Hx   . Inflammatory bowel disease Neg Hx     Social History:  reports that she has never smoked. She has never used smokeless tobacco. She reports current alcohol use of about 1.0 standard drink of alcohol per week. She reports that she does not use drugs.  Allergies:  Allergies  Allergen Reactions  . Wheat Extract Swelling    Medications: I have reviewed the Maria Murphy's current medications.  Results for orders placed or performed during the hospital encounter of 10/18/20 (from the past 48 hour(s))  Urinalysis, Routine w reflex microscopic Urine, Clean Catch     Status: Abnormal   Collection Time: 10/18/20  2:35 AM  Result Value Ref Range   Color, Urine YELLOW YELLOW   APPearance CLEAR CLEAR   Specific Gravity, Urine 1.013 1.005 - 1.030   pH 6.0 5.0 - 8.0   Glucose, UA NEGATIVE NEGATIVE mg/dL   Hgb urine  dipstick NEGATIVE NEGATIVE   Bilirubin Urine NEGATIVE NEGATIVE   Ketones, ur NEGATIVE NEGATIVE mg/dL   Protein, ur NEGATIVE NEGATIVE mg/dL   Nitrite NEGATIVE NEGATIVE   Leukocytes,Ua SMALL (A) NEGATIVE   RBC / HPF 0-5 0 - 5 RBC/hpf   WBC, UA 0-5 0 - 5 WBC/hpf   Bacteria, UA NONE SEEN NONE SEEN   Squamous Epithelial / LPF 0-5 0 - 5   Mucus PRESENT     Comment: Performed at [facility], [address], [place], [place] [address]  POC occult blood, ED     Status: Abnormal   Collection Time:  10/18/20  2:53 AM  Result Value Ref Range   Fecal Occult Bld POSITIVE (A) NEGATIVE  CBC with Differential/Platelet     Status: Abnormal   Collection Time: 10/18/20  3:19 AM  Result Value Ref Range   WBC 6.6 4.0 - 10.5 K/uL   RBC 3.59 (L) 3.87 - 5.11 MIL/uL   Hemoglobin 9.9 (L) 12.0 - 15.0 g/dL   HCT 31.4 (L) 36.0 - 46.0 %   MCV 87.5 80.0 - 100.0 fL   MCH 27.6 26.0 - 34.0 pg   MCHC 31.5 30.0 - 36.0 g/dL   RDW 16.3 (H) 11.5 - 15.5 %   Platelets 208 150 - 400 K/uL   nRBC 0.0 0.0 - 0.2 %   Neutrophils Relative % 66 %   Neutro Abs 4.4 1.7 - 7.7 K/uL   Lymphocytes Relative 21 %   Lymphs Abs 1.4 0.7 - 4.0 K/uL   Monocytes Relative 10 %   Monocytes Absolute 0.6 0.1 - 1.0 K/uL   Eosinophils Relative 2 %   Eosinophils Absolute 0.1 0.0 - 0.5 K/uL   Basophils Relative 1 %   Basophils Absolute 0.0 0.0 - 0.1 K/uL   Immature Granulocytes 0 %   Abs Immature Granulocytes 0.02 0.00 - 0.07 K/uL    Comment: Performed at [facility], [address] [place], [place] [address]  Comprehensive metabolic panel     Status: Abnormal   Collection Time: 10/18/20  3:19 AM  Result Value Ref Range   Sodium 137 135 - 145 mmol/L   Potassium 3.9 3.5 - 5.1 mmol/L   Chloride 107 98 - 111 mmol/L   CO2 22 22 - 32 mmol/L   Glucose, Bld 121 (H) 70 - 99 mg/dL    Comment: Glucose reference range applies only to samples taken after fasting for at least 8 hours.   BUN 16 8 - 23 mg/dL   Creatinine, Ser 0.81 0.44 - 1.00 mg/dL   Calcium 8.8 (L) 8.9 - 10.3 mg/dL   Total Protein 6.6 6.5 - 8.1 g/dL   Albumin 4.0 3.5 - 5.0 g/dL   AST 20 15 - 41 U/L   ALT 7 0 - 44 U/L   Alkaline Phosphatase 43 38 - 126 U/L   Total Bilirubin 0.6 0.3 - 1.2 mg/dL   GFR, Estimated >60 >60 mL/min    Comment: (NOTE) Calculated using the CKD-EPI Creatinine Equation (2021)    Anion gap 8 5 - 15    Comment: Performed at [facility], [address] [place], [place] [address]  Lipase, blood     Status: None   Collection Time: 10/18/20  3:19  AM  Result Value Ref Range   Lipase 31 11 - 51 U/L    Comment: Performed at [facility], [address] [place], [place] [address]  Acetaminophen level     Status: Abnormal   Collection  Time: 10/18/20  3:19 AM  Result Value Ref Range   Acetaminophen (Tylenol), Serum <10 (L) 10 - 30 ug/mL    Comment: (NOTE) Therapeutic concentrations vary significantly. A range of 10-30 ug/mL  may be an effective concentration for many patients. However, some  are best treated at concentrations outside of this range. Acetaminophen concentrations >150 ug/mL at 4 hours after ingestion  and >50 ug/mL at 12 hours after ingestion are often associated with  toxic reactions.  Performed at [facility], [address], [place], [place] [address]   Lactic acid, plasma     Status: None   Collection Time: 10/18/20  3:19 AM  Result Value Ref Range   Lactic Acid, Venous 1.1 0.5 - 1.9 mmol/L    Comment: Performed at [facility], [address] [place], [place] [address]  Protime-INR     Status: None   Collection Time: 10/18/20  3:19 AM  Result Value Ref Range   Prothrombin Time 13.0 11.4 - 15.2 seconds   INR 1.0 0.8 - 1.2    Comment: (NOTE) INR goal varies based on device and disease states. Performed at [facility], [address] [place], [place] [address]   Type and screen [facility]     Status: None   Collection Time: 10/18/20  3:19 AM  Result Value Ref Range   ABO/RH(D) O NEG    Antibody Screen NEG    Sample Expiration      10/21/2020,2359 Performed at [facility], [address], [place], [place] [address]   Magnesium     Status: None   Collection Time: 10/18/20  3:19 AM  Result Value Ref Range   Magnesium 2.1 1.7 - 2.4 mg/dL    Comment: Performed at [facility] [address], [place], [place] [address]  Phosphorus     Status: None   Collection Time: 10/18/20  3:19 AM  Result Value Ref Range   Phosphorus 3.7 2.5 - 4.6 mg/dL    Comment: Performed at [facility], [address], [place], [place] [address]  Resp Panel by RT-PCR (Flu A&B, Covid) Nasopharyngeal Swab     Status: None   Collection Time: 10/18/20  3:52 AM   Specimen: Nasopharyngeal Swab; Nasopharyngeal(NP) swabs in vial transport medium  Result Value Ref Range   SARS Coronavirus 2 by RT PCR NEGATIVE NEGATIVE    Comment: (NOTE) SARS-CoV-2 target nucleic acids are NOT DETECTED.  The SARS-CoV-2 RNA is generally detectable in upper respiratory specimens during the acute phase of infection. The lowest concentration of SARS-CoV-2 viral copies this assay can detect is 138 copies/mL. A negative result does not preclude SARS-Cov-2 infection and should not be used as the sole basis for treatment or other Maria Murphy management decisions. A negative result may occur with  improper specimen collection/handling, submission of specimen other than nasopharyngeal swab, presence of viral mutation(s) within the areas targeted by this assay, and inadequate number of viral copies(<138 copies/mL). A negative result must be combined with clinical observations, Maria Murphy history, and epidemiological information. The expected result is Negative.  Fact Sheet for Patients:  EntrepreneurPulse.com.au  Fact Sheet for Healthcare Providers:  IncredibleEmployment.be  This test is no t yet approved or cleared by the [place] FDA and  has been authorized for detection and/or diagnosis of SARS-CoV-2 by FDA under an Emergency Use Authorization (EUA). This EUA will remain  in effect (meaning this test can be used) for the duration of the COVID-19 declaration under Section 564(b)(1) of the Act,  21 U.S.C.section 360bbb-3(b)(1), unless the authorization is terminated  or revoked sooner.       Influenza A by PCR NEGATIVE NEGATIVE   Influenza B by PCR NEGATIVE NEGATIVE    Comment: (NOTE) The Xpert Xpress SARS-CoV-2/FLU/RSV plus assay is intended as an aid in the diagnosis of influenza from  Nasopharyngeal swab specimens and should not be used as a sole basis for treatment. Nasal washings and aspirates are unacceptable for Xpert Xpress SARS-CoV-2/FLU/RSV testing.  Fact Sheet for Patients: EntrepreneurPulse.com.au  Fact Sheet for Healthcare Providers: IncredibleEmployment.be  This test is not yet approved or cleared by the [place] FDA and has been authorized for detection and/or diagnosis of SARS-CoV-2 by FDA under an Emergency Use Authorization (EUA). This EUA will remain in effect (meaning this test can be used) for the duration of the COVID-19 declaration under Section 564(b)(1) of the Act, 21 U.S.C. section 360bbb-3(b)(1), unless the authorization is terminated or revoked.  Performed at [facility], [address] [place], [place] [address]   Lactic acid, plasma     Status: None   Collection Time: 10/18/20  4:57 AM  Result Value Ref Range   Lactic Acid, Venous 0.6 0.5 - 1.9 mmol/L    Comment: Performed at [facility], [address], [place], [place] [address]    US Abdomen Limited  Result Date: 10/18/2020 CLINICAL DATA:  Abdominal pain.  History of gallbladder stones. EXAM: ULTRASOUND ABDOMEN LIMITED RIGHT UPPER QUADRANT COMPARISON:  CT of the abdomen and pelvis on 10/15/2020 and ultrasound of the abdomen 09/24/2020 FINDINGS: Gallbladder: Gallbladder wall is mildly thickened, 3.6 millimeters. There are numerous stones within the gallbladder, measuring up to 1.5 centimeters. No pericholecystic fluid. Note is made of a positive sonographic Murphy's sign. Common bile duct: Diameter: 5.8 millimeters Liver: Focal areas of increased attenuation particularly within the gallbladder fossa, consistent with focal fatty infiltration. No focal liver lesions are identified. Portal vein is patent on color Doppler imaging with normal direction of blood flow towards the liver. Other: None. IMPRESSION: 1. Cholelithiasis and mild gallbladder wall  thickening. 2. Sonographic Murphy's sign is present today, new since the previous ultrasound exam. Consider acute cholecystitis. 3. There is no pericholecystic fluid. 4. Focal fatty infiltration of the liver. These results will be called to the ordering clinician or representative by the Radiologist Assistant, and communication documented in the PACS or [employer]. Electronically Signed   By: Nolon Nations M.D.   On: 10/18/2020 10:22   DG Abdomen Acute W/Chest  Result Date: 10/18/2020 CLINICAL DATA:  Abdominal pain post surgery for perirectal abscess EXAM: DG ABDOMEN ACUTE WITH 1 VIEW CHEST COMPARISON:  Radiograph 10/16/2020, CT 10/15/2020 FINDINGS: Streaky basilar opacities and low volumes favoring atelectasis. No consolidation, features of edema, pneumothorax, or effusion. Pulmonary vascularity is normally distributed. The cardiomediastinal contours are unremarkable. No subdiaphragmatic free air. Redemonstrated calcification at the level of the pancreatic head corresponding to a lesion on comparison CT. There is increasing air distention of the stomach and mid abdominal small bowel with a moderate to large colonic stool burden. Few calcified gallstones are less well visualized on this exam. No acute osseous abnormality. Degenerative changes in the shoulders, spine, hips and pelvis. The osseous structures appear diffusely demineralized which may limit detection of small or nondisplaced fractures. No worrisome findings of the remaining soft tissues. IMPRESSION: 1. Moderate to large colonic stool burden with increasing air distention of the stomach and mid abdominal small bowel possibly related to constipation though could reflect an early or developing  ileus in the appropriate clinical setting. No free air. 2. Calcification of the level of the pancreatic head corresponding to a pancreatic mass on comparison CT. 3. Lung volumes are low with streaky basilar opacities favoring atelectasis. Electronically  Signed   By: Lovena Le M.D.   On: 10/18/2020 04:09    ROS:  Pertinent items are noted in HPI.  Blood pressure 110/65, pulse (!) 56, temperature 97.9 F (36.6 C), temperature source Oral, resp. rate 10, height 5\' 4"  (1.626 m), weight 83.1 kg, SpO2 96 %. Physical Exam: Pleasant female lying in the bed in no acute distress. Head is normocephalic, atraumatic Lungs clear to auscultation with good breath sounds bilaterally Heart examination reveals a regular rate and rhythm without S3, S4, murmurs Abdomen is soft with minimal discomfort to palpation in the epigastric and right upper quadrant regions.  No rigidity is noted.  Well-healed surgical scars present.  Assessment/Plan: Impression: Hemoccult positive stools in the face of a decreased hemoglobin, history of chronic anticoagulation.  Maria Murphy also has a known pancreatic cyst and cholelithiasis.  Her liver enzyme tests are within normal limits and I doubt she is having an episode of acute cholecystitis.  We will follow peripherally with you.  We will see what the EGD shows.  Aviva Signs 10/18/2020, 11:29 AM

## 2020-10-18 NOTE — Telephone Encounter (Signed)
Left message on machine to call back  

## 2020-10-18 NOTE — H&P (Signed)
History and Physical  Maria Murphy SEG:[id number] DOB: 24-Oct-1955 DOA: 10/18/2020  Referring physician: Ezequiel Essex, MD PCP: Ladell Pier, MD  Patient coming from: Home  Chief Complaint: Abdominal pain  HPI: Maria Murphy is a 65 y.o. female with medical history significant for PUD/duodenal ulcers, prior PEs on Xarelto, hypertension, obesity, and cholelithiasis who presented to the ED due to 10 day onset of worsening abdominal pain, nausea, vomiting.  Abdominal pain was rated as 10/10 on pain scale, it was intermittent and was colicky in nature in the right upper quadrant.  No known alleviating/aggravating factors.  She was seen in the ED on 2/22 and 2/23 during which CT done on 2/22 showed an enlarged pancreatic cyst lesion, she was being followed by gastroenterology and was scheduled for an endoscopy next week.  She returned this morning due to worsening abdominal pain despite taking oxycodone 10 mg every 4 hours and Tylenol every 8 hours (prescribed during last ED visit).  Patient endorsed black stools which started last night (2/24), stool was watery, she also complained of vomiting x1.  Patient states that she called her gastroenterologist and was told to go to the ED for further evaluation.  She denies chest pain, shortness of breath, fever, headache, chills.  ED Course:  In the emergency department, temperature was 97.67F, she was initially tachypneic, other vital signs are within normal range.  Work-up in the ED showed normocytic anemia, H/H 9.9/31.4 (this was 11.1/34.9 on 10/16/2020), lipase 31, TSH 1.13, lactic acid 1.1, FOBT positive. Abdominal x-ray showed 1.  Moderate to large colonic stool burden with increasing air distention of the stomach and mid abdominal small bowel possibly related to constipation though could reflect an early or developing ileus in the appropriate clinical setting. No free air. 2. Calcification of the level of the pancreatic head corresponding to a  pancreatic mass on comparison CT. She was treated with IV Zofran and IV Dilaudid, and patient was started on IV Protonix drip.  IV hydration was provided.  Hospitalist was asked to admit patient for further evaluation and management.  Review of Systems: Constitutional: Positive for appetite change.  Negative for chills and fever.  HENT: Negative for ear pain and sore throat.   Eyes: Negative for pain and visual disturbance.  Respiratory: Negative for cough, chest tightness and shortness of breath.   Cardiovascular: Negative for chest pain and palpitations.  Gastrointestinal: Positive for abdominal pain , nausea, vomiting, diarrhea and blood in stool.  Endocrine: Negative for polyphagia and polyuria.  Genitourinary: Negative for decreased urine volume, dysuria, enuresis Musculoskeletal: Negative for arthralgias and back pain.  Skin: Negative for color change and rash.  Allergic/Immunologic: Negative for immunocompromised state.  Neurological: Negative for tremors, syncope, speech difficulty Hematological: Does not bruise/bleed easily.  All other systems reviewed and are negative    Past Medical History:  Diagnosis Date  . Collagen vascular disease ([place])   . Essential hypertension, benign 01/24/2015  . Foot ulcer ([place])   . Hypertension   . Memory loss   . PUD (peptic ulcer disease)    with perforation s/p exploratory laparotomy  . Skin cancer   . Vision abnormalities    Past Surgical History:  Procedure Laterality Date  . CENTRAL LINE INSERTION Right 04/22/2020   Procedure: CENTRAL LINE INSERTION;  Surgeon: Virl Cagey, MD;  Location: [facility];  Service: General;  Laterality: Right;  . ESOPHAGOGASTRODUODENOSCOPY (EGD) WITH PROPOFOL N/A 07/10/2020   Procedure: ESOPHAGOGASTRODUODENOSCOPY (EGD) WITH PROPOFOL;  Surgeon: Rogene Houston, MD;  Location: [facility];  Service: Endoscopy;  Laterality: N/A;  . GASTRORRHAPHY  04/22/2020   Procedure: GASTRORRHAPHY;  Surgeon: Virl Cagey, MD;  Location: [facility];  Service: General;;  . IR CATHETER TUBE CHANGE  05/09/2020  . LAPAROTOMY N/A 04/22/2020   Procedure: EXPLORATORY LAPAROTOMY;  Surgeon: Virl Cagey, MD;  Location: [facility];  Service: General;  Laterality: N/A;  . TONSILLECTOMY      Social History:  reports that she has never smoked. She has never used smokeless tobacco. She reports current alcohol use of about 1.0 standard drink of alcohol per week. She reports that she does not use drugs.   Allergies  Allergen Reactions  . Wheat Extract Swelling    Family History  Problem Relation Age of Onset  . High Cholesterol Mother   . Hypertension Mother   . Arthritis/Rheumatoid Mother   . Alcohol abuse Father   . Colon cancer Neg Hx   . Colon polyps Neg Hx   . Inflammatory bowel disease Neg Hx      Prior to Admission medications   Medication Sig Start Date End Date Taking? Authorizing Provider  acetaminophen (TYLENOL) 500 MG tablet Take 1 tablet (500 mg total) by mouth every 6 (six) hours as needed for mild pain or fever. Patient taking differently: Take 500 mg by mouth as needed for mild pain or fever. 09/27/20   Johnson, Clanford L, MD  citalopram (CELEXA) 20 MG tablet Take 1 tablet (20 mg total) by mouth daily. 10/08/20   Ladell Pier, MD  diclofenac Sodium (VOLTAREN) 1 % GEL Apply 2 g topically 4 (four) times daily. 09/20/20   Ladell Pier, MD  dicyclomine (BENTYL) 10 MG capsule Take 1 capsule (10 mg total) by mouth 2 (two) times daily. 10/08/20   Ladell Pier, MD  donepezil (ARICEPT) 10 MG tablet Take 0.5-1 tablets (5-10 mg total) by mouth in the morning and at bedtime. 09/20/20   Ladell Pier, MD  gabapentin (NEURONTIN) 300 MG capsule Take 1 capsule (300 mg total) by mouth 2 (two) times daily. 10/08/20   Ladell Pier, MD  lipase/protease/amylase (CREON) 36000 UNITS CPEP capsule Take 2 capsules (72,000 Units total) by mouth 3 (three) times daily with meals. May also take 1  capsule (36,000 Units total) as needed (with snacks). 10/16/20   Erenest Rasher, PA-C  lisinopril-hydrochlorothiazide (ZESTORETIC) 10-12.5 MG tablet Take 1 tablet by mouth daily. 09/20/20   Ladell Pier, MD  mirtazapine (REMERON) 15 MG tablet Take 1 tablet (15 mg total) by mouth at bedtime. 10/08/20   Ladell Pier, MD  ondansetron (ZOFRAN ODT) 8 MG disintegrating tablet Take 1 tablet (8 mg total) by mouth every 8 (eight) hours as needed for nausea or vomiting. 10/16/20   Erenest Rasher, PA-C  oxyCODONE (ROXICODONE) 5 MG immediate release tablet 1 to 2 tablets (5-10 mg) by mouth as needed every 4 hours for pain 10/16/20   Horton, Barbette Hair, MD  pantoprazole (PROTONIX) 40 MG tablet Take 1 tablet (40 mg total) by mouth 2 (two) times daily. 09/20/20 10/20/20  Ladell Pier, MD  rivaroxaban (XARELTO) 20 MG TABS tablet Take 1 tablet (20 mg total) by mouth daily with supper. To start after initial loading dose pack. 09/20/20   Ladell Pier, MD  sucralfate (CARAFATE) 1 GM/10ML suspension Take 10 mLs (1 g total) by mouth 4 (four) times daily -  with meals and at bedtime. 09/20/20   Ladell Pier, MD  traZODone (DESYREL) 100 MG tablet Take 1 tablet (100 mg total) by mouth at bedtime. 10/08/20   Ladell Pier, MD    Physical Exam: BP (!) 143/76   Pulse 69   Temp (!) 97.3 F (36.3 C) (Oral)   Resp 16   Ht 5\' 4"  (1.626 m)   Wt 83.1 kg   SpO2 95%   BMI 31.45 kg/m   . General: 65 y.o. year-old female well developed well nourished in no acute distress.  Alert and oriented x3. Marland Kitchen HEENT: NCAT, EOMI . Neck: Supple, trachea medial . Cardiovascular: Regular rate and rhythm with no rubs or gallops.  No thyromegaly or JVD noted.  No lower extremity edema. 2/4 pulses in all 4 extremities. Marland Kitchen Respiratory: Clear to auscultation with no wheezes or rales. Good inspiratory effort. . Abdomen: Soft, tender to palpation in RUQ.  Normal bowel sounds x4 quadrants. . Muskuloskeletal: No  cyanosis, clubbing or edema noted bilaterally . Neuro: CN II-XII intact, strength 5/5 x 4, sensation, reflexes . Skin: No ulcerative lesions noted or rashes . Psychiatry: Judgement and insight appear normal. Mood is appropriate for condition and setting          Labs on Admission:  Basic Metabolic Panel: Recent Labs  Lab 10/15/20 0219 10/16/20 0439 10/18/20 0319  NA 136 137 137  K 4.1 3.9 3.9  CL 104 106 107  CO2 23 22 22   GLUCOSE 116* 116* 121*  BUN 20 21 16   CREATININE 0.84 0.72 0.81  CALCIUM 9.5 9.3 8.8*   Liver Function Tests: Recent Labs  Lab 10/15/20 0219 10/16/20 0439 10/18/20 0319  AST 21 16 20   ALT 8 9 7   ALKPHOS 47 42 43  BILITOT 0.6 0.4 0.6  PROT 7.9 7.1 6.6  ALBUMIN 4.6 4.1 4.0   Recent Labs  Lab 10/15/20 0219 10/16/20 0439 10/18/20 0319  LIPASE 41 30 31   No results for input(s): AMMONIA in the last 168 hours. CBC: Recent Labs  Lab 10/15/20 0219 10/16/20 0439 10/18/20 0319  WBC 6.9 6.6 6.6  NEUTROABS  --  5.0 4.4  HGB 12.2 11.1* 9.9*  HCT 37.2 34.9* 31.4*  MCV 85.5 86.2 87.5  PLT 226 199 208   Cardiac Enzymes: No results for input(s): CKTOTAL, CKMB, CKMBINDEX, TROPONINI in the last 168 hours.  BNP (last 3 results) No results for input(s): BNP in the last 8760 hours.  ProBNP (last 3 results) No results for input(s): PROBNP in the last 8760 hours.  CBG: No results for input(s): GLUCAP in the last 168 hours.  Radiological Exams on Admission: DG Abdomen Acute W/Chest  Result Date: 10/18/2020 CLINICAL DATA:  Abdominal pain post surgery for perirectal abscess EXAM: DG ABDOMEN ACUTE WITH 1 VIEW CHEST COMPARISON:  Radiograph 10/16/2020, CT 10/15/2020 FINDINGS: Streaky basilar opacities and low volumes favoring atelectasis. No consolidation, features of edema, pneumothorax, or effusion. Pulmonary vascularity is normally distributed. The cardiomediastinal contours are unremarkable. No subdiaphragmatic free air. Redemonstrated calcification at  the level of the pancreatic head corresponding to a lesion on comparison CT. There is increasing air distention of the stomach and mid abdominal small bowel with a moderate to large colonic stool burden. Few calcified gallstones are less well visualized on this exam. No acute osseous abnormality. Degenerative changes in the shoulders, spine, hips and pelvis. The osseous structures appear diffusely demineralized which may limit detection of small or nondisplaced fractures. No worrisome findings of the remaining soft tissues. IMPRESSION: 1. Moderate to large colonic stool burden with increasing  air distention of the stomach and mid abdominal small bowel possibly related to constipation though could reflect an early or developing ileus in the appropriate clinical setting. No free air. 2. Calcification of the level of the pancreatic head corresponding to a pancreatic mass on comparison CT. 3. Lung volumes are low with streaky basilar opacities favoring atelectasis. Electronically Signed   By: Lovena Le M.D.   On: 10/18/2020 04:09   DG Abdomen Acute W/Chest  Result Date: 10/16/2020 CLINICAL DATA:  Abdominal pain, change EXAM: DG ABDOMEN ACUTE WITH 1 VIEW CHEST COMPARISON:  CT 10/15/2020, ultrasound 09/24/2020, radiograph 09/24/2020 FINDINGS: Some mildly coarsened interstitial changes are similar to prior with basilar atelectasis. No consolidation, features of edema, pneumothorax, or effusion. Unchanged cardiomediastinal contours. No subdiaphragmatic free air. No high-grade obstructive bowel gas pattern. Moderate colonic stool burden. Redemonstration of the calcification at the level of the pancreatic head associated with a cystic pancreatic neoplasm on comparison CT imaging. Few calcified gallstones are noted as well. Vascular calcium of the abdomen and pelvis. Few phleboliths in the deep pelvis. Extensive degenerative changes in the shoulders, spine, hips and pelvis. The osseous structures appear diffusely  demineralized which may limit detection of small or nondisplaced fractures. No acute osseous abnormality or suspicious osseous lesion. IMPRESSION: 1. 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 No acute cardiopulmonary abnormality. 2. Nonobstructive bowel gas pattern. Moderate colonic stool burden. 3. Calcification associated with the pancreatic lesion on comparison CT. Partly visualized gallstones as well. Electronically Signed   By: Lovena Le M.D.   On: 10/16/2020 05:26    EKG: I independently viewed the EKG done and my findings are as followed: EKG was not done in the ED   Assessment/Plan Present on Admission: . GI bleed . Nausea vomiting and diarrhea . Abdominal pain . Essential hypertension . Duodenal ulcer  Principal Problem:   GI bleed Active Problems:   Essential hypertension   Abdominal pain   Duodenal ulcer   Nausea vomiting and diarrhea   Pancreatic mass   History of pulmonary embolus (PE)   Obesity (BMI 30-39.9)   GERD (gastroesophageal reflux disease)  Acute GI bleed H/H 9.9/31.4 (this was 11.1/34.9 on 10/16/2020), Hemoccult was positive Continue IV Protonix drip Gastroenterologist will be consulted in the morning  Abdominal pain, nausea, vomiting and diarrhea History of duodenal ulcers with stenosis/PUD CT abdomen pelvis done on 10/15/2020 showed inflamed appearance of the distal stomach and proximal duodenum compatible with recurrent acute peptic ulcer disease Continue IV NS  Continue IV Dilaudid 0.5mg  q.4h p.r.n. for moderate to severe pain Continue IV Protonix Continue IV Zofran p.r.n. Continue n.p.o. Last bowel movement was around 4 AM today per patient Patient was scheduled for EUS on March 30 and was also scheduled for an endoscopy on March 4.  She was thought to require a EUS and EGD prior to cholecystectomy per Dr. Constance Haw of general surgery per ED medical record RUQ ultrasound will be ordered  Pancreatic mass CT  abdomen pelvis with contrast done on 2/22 showed appearance suspicious for slowly enlarging cystic pancreatic neoplasm. This was evaluated by MRI in August 2021 (please see that report), and appears mildly larger (4.3 cm) with increased conspicuity of the pancreatic duct since that time. Patient follows with gastroenterologist  Hypertension Continue IV labetalol as needed Hold home medications for now  History of PE Xarelto will be held at this time due to GI bleed  Obesity (BMI 31.45) Patient will be consulted on diet and lifestyle modification  when stable  GERD Continue IV Protonix  DVT prophylaxis: SCDs  Code Status: Full code  Family Communication: None at bedside  Disposition Plan:  Patient is from:                        home Anticipated DC to:                   SNF or family members home Anticipated DC date:               2-3 days Anticipated DC barriers:         Patient requires inpatient management due to GI bleed, nausea, vomiting and abdominal pain requiring GI evaluation and management  Consults called: Gastroenterology  Admission status: Inpatient  Bernadette Hoit MD [facility]  10/18/2020, 4:42 AM

## 2020-10-18 NOTE — Telephone Encounter (Signed)
Maria Murphy, see telephone note 09/05/20. Dr. Asencion Noble already okay'd for her to hold xarelto prior to procedure. We were not able to reach patient to schedule her at that time. Once you speak with patient and she is agreeable to EGD, we then will schedule. Thanks!

## 2020-10-19 DIAGNOSIS — K922 Gastrointestinal hemorrhage, unspecified: Secondary | ICD-10-CM | POA: Diagnosis not present

## 2020-10-19 DIAGNOSIS — R1011 Right upper quadrant pain: Secondary | ICD-10-CM | POA: Diagnosis not present

## 2020-10-19 DIAGNOSIS — Z86711 Personal history of pulmonary embolism: Secondary | ICD-10-CM | POA: Diagnosis not present

## 2020-10-19 DIAGNOSIS — K269 Duodenal ulcer, unspecified as acute or chronic, without hemorrhage or perforation: Secondary | ICD-10-CM | POA: Diagnosis not present

## 2020-10-19 DIAGNOSIS — K921 Melena: Secondary | ICD-10-CM | POA: Diagnosis not present

## 2020-10-19 DIAGNOSIS — I10 Essential (primary) hypertension: Secondary | ICD-10-CM | POA: Diagnosis not present

## 2020-10-19 LAB — CBC
HCT: 28.1 % — ABNORMAL LOW (ref 36.0–46.0)
Hemoglobin: 8.8 g/dL — ABNORMAL LOW (ref 12.0–15.0)
MCH: 27.9 pg (ref 26.0–34.0)
MCHC: 31.3 g/dL (ref 30.0–36.0)
MCV: 89.2 fL (ref 80.0–100.0)
Platelets: 167 10*3/uL (ref 150–400)
RBC: 3.15 MIL/uL — ABNORMAL LOW (ref 3.87–5.11)
RDW: 16.5 % — ABNORMAL HIGH (ref 11.5–15.5)
WBC: 3.3 10*3/uL — ABNORMAL LOW (ref 4.0–10.5)
nRBC: 0 % (ref 0.0–0.2)

## 2020-10-19 LAB — COMPREHENSIVE METABOLIC PANEL
ALT: 6 U/L (ref 0–44)
AST: 15 U/L (ref 15–41)
Albumin: 3.1 g/dL — ABNORMAL LOW (ref 3.5–5.0)
Alkaline Phosphatase: 33 U/L — ABNORMAL LOW (ref 38–126)
Anion gap: 8 (ref 5–15)
BUN: 6 mg/dL — ABNORMAL LOW (ref 8–23)
CO2: 22 mmol/L (ref 22–32)
Calcium: 8.7 mg/dL — ABNORMAL LOW (ref 8.9–10.3)
Chloride: 111 mmol/L (ref 98–111)
Creatinine, Ser: 0.58 mg/dL (ref 0.44–1.00)
GFR, Estimated: >60 mL/min (ref >60)
Glucose, Bld: 91 mg/dL (ref 70–99)
Potassium: 3.9 mmol/L (ref 3.5–5.1)
Sodium: 141 mmol/L (ref 135–145)
Total Bilirubin: 0.4 mg/dL (ref 0.3–1.2)
Total Protein: 5.3 g/dL — ABNORMAL LOW (ref 6.5–8.1)

## 2020-10-19 LAB — APTT: aPTT: 37 seconds — ABNORMAL HIGH (ref 24–36)

## 2020-10-19 LAB — PROTIME-INR
INR: 1.1 (ref 0.8–1.2)
Prothrombin Time: 13.8 seconds (ref 11.4–15.2)

## 2020-10-19 MED ORDER — MIRTAZAPINE 15 MG PO TABS
15.0000 mg | ORAL_TABLET | Freq: Every day | ORAL | Status: DC
  Administered 2020-10-19 – 2020-10-22 (×4): 15 mg via ORAL
  Filled 2020-10-19 (×4): qty 1

## 2020-10-19 MED ORDER — CITALOPRAM HYDROBROMIDE 20 MG PO TABS
20.0000 mg | ORAL_TABLET | Freq: Every day | ORAL | Status: DC
  Administered 2020-10-19 – 2020-10-23 (×5): 20 mg via ORAL
  Filled 2020-10-19 (×4): qty 1

## 2020-10-19 NOTE — Progress Notes (Signed)
Subjective: Patient reports feeling slightly better today. Tolerating her current diet. Denies any melena or hematochezia. Hemoglobin stable.  Objective: Vital signs in last 24 hours: Temp:  [97.6 F (36.4 C)-98.2 F (36.8 C)] 97.6 F (36.4 C) (02/26 0600) Pulse Rate:  [52-73] 55 (02/26 0600) Resp:  [7-25] 16 (02/26 0600) BP: (95-138)/(56-88) 115/67 (02/26 0600) SpO2:  [96 %-100 %] 96 % (02/26 0600) Weight:  [80.7 kg] 80.7 kg (02/25 1335)   General:   Alert and oriented, pleasant Head:  Normocephalic and atraumatic. Eyes:  No icterus, sclera clear. Conjuctiva pink.  Mouth:  Without lesions, mucosa pink and moist.  Neck:  Supple, without thyromegaly or masses.  Abdomen:  Bowel sounds present, soft, midlly-tender, non-distended. No HSM or hernias noted. No rebound or guarding. No masses appreciated  Msk:  Symmetrical without gross deformities. Normal posture. Pulses:  Normal pulses noted. Extremities:  Without clubbing or edema. Neurologic:  Alert and  oriented x4;  grossly normal neurologically. Skin:  Warm and dry, intact without significant lesions.  Cervical Nodes:  No significant cervical adenopathy. Psych:  Alert and cooperative. Normal mood and affect.  Intake/Output from previous day: 02/25 0701 - 02/26 0700 In: 2586.9 [I.V.:2586.9] Out: -  Intake/Output this shift: Total I/O In: 240 [P.O.:240] Out: -   Lab Results: Recent Labs    10/18/20 0319 10/19/20 0452  WBC 6.6 3.3*  HGB 9.9* 8.8*  HCT 31.4* 28.1*  PLT 208 167   BMET Recent Labs    10/18/20 0319 10/19/20 0452  NA 137 141  K 3.9 3.9  CL 107 111  CO2 22 22  GLUCOSE 121* 91  BUN 16 6*  CREATININE 0.81 0.58  CALCIUM 8.8* 8.7*   LFT Recent Labs    10/18/20 0319 10/19/20 0452  PROT 6.6 5.3*  ALBUMIN 4.0 3.1*  AST 20 15  ALT 7 6  ALKPHOS 43 33*  BILITOT 0.6 0.4   PT/INR Recent Labs    10/18/20 0319 10/19/20 0452  LABPROT 13.0 13.8  INR 1.0 1.1   Hepatitis Panel No results for  input(s): HEPBSAG, HCVAB, HEPAIGM, HEPBIGM in the last 72 hours.   Studies/Results: US Abdomen Limited  Result Date: 10/18/2020 CLINICAL DATA:  Abdominal pain.  History of gallbladder stones. EXAM: ULTRASOUND ABDOMEN LIMITED RIGHT UPPER QUADRANT COMPARISON:  CT of the abdomen and pelvis on 10/15/2020 and ultrasound of the abdomen 09/24/2020 FINDINGS: Gallbladder: Gallbladder wall is mildly thickened, 3.6 millimeters. There are numerous stones within the gallbladder, measuring up to 1.5 centimeters. No pericholecystic fluid. Note is made of a positive sonographic Murphy's sign. Common bile duct: Diameter: 5.8 millimeters Liver: Focal areas of increased attenuation particularly within the gallbladder fossa, consistent with focal fatty infiltration. No focal liver lesions are identified. Portal vein is patent on color Doppler imaging with normal direction of blood flow towards the liver. Other: None. IMPRESSION: 1. Cholelithiasis and mild gallbladder wall thickening. 2. Sonographic Murphy's sign is present today, new since the previous ultrasound exam. Consider acute cholecystitis. 3. There is no pericholecystic fluid. 4. Focal fatty infiltration of the liver. These results will be called to the ordering clinician or representative by the Radiologist Assistant, and communication documented in the PACS or [employer]. Electronically Signed   By: Nolon Nations M.D.   On: 10/18/2020 10:22   DG Abdomen Acute W/Chest  Result Date: 10/18/2020 CLINICAL DATA:  Abdominal pain post surgery for perirectal abscess EXAM: DG ABDOMEN ACUTE WITH 1 VIEW CHEST COMPARISON:  Radiograph 10/16/2020, CT 10/15/2020 FINDINGS: Streaky  basilar opacities and low volumes favoring atelectasis. No consolidation, features of edema, pneumothorax, or effusion. Pulmonary vascularity is normally distributed. The cardiomediastinal contours are unremarkable. No subdiaphragmatic free air. Redemonstrated calcification at the level of the  pancreatic head corresponding to a lesion on comparison CT. There is increasing air distention of the stomach and mid abdominal small bowel with a moderate to large colonic stool burden. Few calcified gallstones are less well visualized on this exam. No acute osseous abnormality. Degenerative changes in the shoulders, spine, hips and pelvis. The osseous structures appear diffusely demineralized which may limit detection of small or nondisplaced fractures. No worrisome findings of the remaining soft tissues. IMPRESSION: 1. Moderate to large colonic stool burden with increasing air distention of the stomach and mid abdominal small bowel possibly related to constipation though could reflect an early or developing ileus in the appropriate clinical setting. No free air. 2. Calcification of the level of the pancreatic head corresponding to a pancreatic mass on comparison CT. 3. Lung volumes are low with streaky basilar opacities favoring atelectasis. Electronically Signed   By: Lovena Le M.D.   On: 10/18/2020 04:09    Assessment: *Large duodenal ulcer *Upper GI bleeding *Abdominal pain due to above *Nausea and vomiting-improved *History of saddle pulmonary embolus 04/2020  Plan: Hemoglobin is stable. Continue to monitor daily.  Discussed case with surgery, no indication for surgical intervention at this time.  Recommend continued on PPI ggt for a total of 72 hours. Carafate 4 times daily.  Gastrin levels pending  Advance diet as tolerated.  After 72 hours of IV PPI, we can consider restarting her Xarelto. Will need to determine how long she will need to be on this medication going forward.   GI to continue to follow.   Elon Alas. Abbey Chatters, D.O. Gastroenterology and Hepatology [facility]   LOS: 1 day    10/19/2020, 12:02 PM

## 2020-10-19 NOTE — Progress Notes (Signed)
PROGRESS NOTE  Maria Murphy WUJ:[id number] DOB: March 04, 1956 DOA: 10/18/2020 PCP: Ladell Pier, MD  Brief History:  65 year old female with history of hypertension, peptic ulcer disease with perforation, saddle pulmonary embolus on rivaroxaban, depression presenting with 10-day history of intermittent abdominal pain and nausea and vomiting.  The patient states that she has had approximately 2-3 episodes of emesis daily with upper abdominal pain in the epigastric and left upper quadrant region.  She denies any fevers, chills, chest pain, shortness breath, coughing, hemoptysis.  She has not had any hematemesis.  She has had loose stools with occasional melena. The patient has had 2 recent emergency department visits.  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  There was also more prominent main pancreatic duct.  There is also an inflamed appearance of the distal stomach and proximal duodenum with adjacent fat stranding that was increased since 09/24/2020.  There is mild inflamed appearance of the gallbladder and porta hepatis.  The patient was discharged home in stable condition after fluids and conservative care in the emergency department with a prescription for oxycodone. The patient endorses compliance with her Protonix and Carafate.  The patient states that she has not taken any NSAIDs recently.  She endorses compliance with her rivaroxaban. Notably, the patient has had a complicated history of peptic ulcer disease secondary to NSAID use.  She had a prolonged hospitalization from 04/21/2020 to 05/17/2020 secondary to a perforated gastric ulcer.  She underwent exploratory laparotomy with Phillip Heal patch placed on 04/22/2021.  This was complicated by intra-abdominal abscess for which required IR aspiration.  That hospitalization was also complicated by a pulmonary embolus for which the  patient was started on rivaroxaban.  She was subsequently remitted to the hospital on 07/05/2020 to 07/12/2020 for intractable nausea and vomiting.  Fortunately, the patient improved with conservative/nonoperative management.  She underwent EGD on 07/10/2020 which showed a scar in the gastric antrum and prepyloric region.  There was nonbleeding duodenal ulcers.  Once again, the patient was readmitted from 09/23/2020 to 09/27/20 for abdominal pain and intractable nausea and vomiting.  Again, the patient improved with conservative care.  In the emergency department, the patient was afebrile hemodynamically stable with oxygen saturation 100% room air.  Sodium was 137, potassium 3.9, serum creatinine 0.81.  LFTs were unremarkable.  Lipase was 31.  WBC 6.6, hemoglobin 9.9, platelets 208,000.  The patient was started on a Protonix drip.  GI was consulted to assist with management.  Assessment/Plan: Intractable abdominal pain/vomiting/melena -Suspect this is secondary to her peptic ulcer disease -Continue Protonix drip through 10/20/20 -GI consult appreciated -10/18/20 EGD--nonbleeding gastric ulcers;  large duodenal ulcer in the duodenal bulb, close to 35 mm in diameter; no presence of stigmata of recent bleeding but there was presence of a suture at the bed of the ulcer which was semicircumferential -Avoid NSAIDs -Holding rivaroxaban temporarily -10/15/2020 CT abdomen--as discussed above -10/18/20 AXR--mod to large stool burden with increase air distension of stomach to mid SB -10/19/20-discussed with GI, Dr. Abbey Chatters  Inflamed gallbladder appearance -09/18/2020 HIDA scan normal -10/18/20 RUQ US--Cholelithiasis and mild gallbladder wall thickening. +murphy's -Previously seen by surgery who did not feel that her clinical presentation was consistent with cholecystitis -appreciate general consult-->did not feel patient has cholecysitis  Pancreatic head mass -This has been followed by surveillance by  GI  ABLA -partly  due to IVF given from prior ED visits -baseline Hgb 10-11  History of pulmonary embolus -Patient had submassive/saddle embolus on CTA on 05/01/2020 -Temporarily holding rivaroxaban -patient has been taking faithfully since beginning of Nov 2021 -plan 6 full months of tx-->end of April  Essential hypertension -Holding lisinopril HCTZ for now -Monitor BP -IV labetalol as needed SBP >180  Depression -Restart Celexa and trazodone once able to tolerate p.o. -Restart Remeron once able to tolerate p.o.      Status is: Inpatient  Remains inpatient appropriate because:IV treatments appropriate due to intensity of illness or inability to take PO   Dispo: The patient is from: Home  Anticipated d/c is to: Home  Patient currently is not medically stable to d/c.              Difficult to place patient No        Family Communication:  sister updated at bedside 2/26 Consultants:  GI  Code Status:  FULL  DVT Prophylaxis:  Xarelto on hold   Procedures: As Listed in Progress Note Above  Antibiotics: None    Status is: Inpatient  Remains inpatient appropriate because:IV treatments appropriate due to intensity of illness or inability to take PO   Dispo: The patient is from: Home              Anticipated d/c is to: Home              Patient currently is not medically stable to d/c.   Difficult to place patient No            Subjective: Patient denies fevers, chills, headache, chest pain, dyspnea, nausea, vomiting, diarrhea, abdominal pain, dysuria, hematuria, hematochezia, and melena.   Objective: Vitals:   10/18/20 1651 10/18/20 2150 10/19/20 0600 10/19/20 1418  BP: 138/88 134/71 115/67 131/77  Pulse: (!) 53 (!) 54 (!) 55 62  Resp:  18 16   Temp: 97.8 F (36.6 C) 97.9 F (36.6 C) 97.6 F (36.4 C) 98.3 F (36.8 C)  TempSrc: Oral Oral Oral Oral  SpO2: 100% 97% 96% 98%  Weight:       Height:        Intake/Output Summary (Last 24 hours) at 10/19/2020 1445 Last data filed at 10/19/2020 1100 Gross per 24 hour  Intake 2826.88 ml  Output --  Net 2826.88 ml   Weight change: -2.359 kg Exam:   General:  Pt is alert, follows commands appropriately, not in acute distress  HEENT: No icterus, No thrush, No neck mass, [place]/AT  Cardiovascular: RRR, S1/S2, no rubs, no gallops  Respiratory: CTA bilaterally, no wheezing, no crackles, no rhonchi  Abdomen: Soft/+BS, non tender, non distended, no guarding  Extremities: No edema, No lymphangitis, No petechiae, No rashes, no synovitis   Data Reviewed: I have personally reviewed following labs and imaging studies Basic Metabolic Panel: Recent Labs  Lab 10/15/20 0219 10/16/20 0439 10/18/20 0319 10/19/20 0452  NA 136 137 137 141  K 4.1 3.9 3.9 3.9  CL 104 106 107 111  CO2 23 22 22 22   GLUCOSE 116* 116* 121* 91  BUN 20 21 16  6*  CREATININE 0.84 0.72 0.81 0.58  CALCIUM 9.5 9.3 8.8* 8.7*  MG  --   --  2.1  --   PHOS  --   --  3.7  --    Liver Function Tests: Recent Labs  Lab 10/15/20 0219 10/16/20 0439 10/18/20 0319 10/19/20 0452  AST 21 16 20 15   ALT 8  9 7 6   ALKPHOS 47 42 43 33*  BILITOT 0.6 0.4 0.6 0.4  PROT 7.9 7.1 6.6 5.3*  ALBUMIN 4.6 4.1 4.0 3.1*   Recent Labs  Lab 10/15/20 0219 10/16/20 0439 10/18/20 0319  LIPASE 41 30 31   No results for input(s): AMMONIA in the last 168 hours. Coagulation Profile: Recent Labs  Lab 10/18/20 0319 10/19/20 0452  INR 1.0 1.1   CBC: Recent Labs  Lab 10/15/20 0219 10/16/20 0439 10/18/20 0319 10/19/20 0452  WBC 6.9 6.6 6.6 3.3*  NEUTROABS  --  5.0 4.4  --   HGB 12.2 11.1* 9.9* 8.8*  HCT 37.2 34.9* 31.4* 28.1*  MCV 85.5 86.2 87.5 89.2  PLT 226 199 208 167   Cardiac Enzymes: No results for input(s): CKTOTAL, CKMB, CKMBINDEX, TROPONINI in the last 168 hours. BNP: Invalid input(s): POCBNP CBG: No results for input(s): GLUCAP in the last 168  hours. HbA1C: No results for input(s): HGBA1C in the last 72 hours. Urine analysis:    Component Value Date/Time   COLORURINE YELLOW 10/18/2020 0235   APPEARANCEUR CLEAR 10/18/2020 0235   LABSPEC 1.013 10/18/2020 0235   PHURINE 6.0 10/18/2020 0235   GLUCOSEU NEGATIVE 10/18/2020 0235   HGBUR NEGATIVE 10/18/2020 0235   BILIRUBINUR NEGATIVE 10/18/2020 0235   KETONESUR NEGATIVE 10/18/2020 0235   PROTEINUR NEGATIVE 10/18/2020 0235   NITRITE NEGATIVE 10/18/2020 0235   LEUKOCYTESUR SMALL (A) 10/18/2020 0235   Sepsis Labs: @LABRCNTIP (procalcitonin:4,lacticidven:4) ) Recent Results (from the past 240 hour(s))  Resp Panel by RT-PCR (Flu A&B, Covid) Nasopharyngeal Swab     Status: None   Collection Time: 10/18/20  3:52 AM   Specimen: Nasopharyngeal Swab; Nasopharyngeal(NP) swabs in vial transport medium  Result Value Ref Range Status   SARS Coronavirus 2 by RT PCR NEGATIVE NEGATIVE Final    Comment: (NOTE) SARS-CoV-2 target nucleic acids are NOT DETECTED.  The SARS-CoV-2 RNA is generally detectable in upper respiratory specimens during the acute phase of infection. The lowest concentration of SARS-CoV-2 viral copies this assay can detect is 138 copies/mL. A negative result does not preclude SARS-Cov-2 infection and should not be used as the sole basis for treatment or other patient management decisions. A negative result may occur with  improper specimen collection/handling, submission of specimen other than nasopharyngeal swab, presence of viral mutation(s) within the areas targeted by this assay, and inadequate number of viral copies(<138 copies/mL). A negative result must be combined with clinical observations, patient history, and epidemiological information. The expected result is Negative.  Fact Sheet for Patients:  EntrepreneurPulse.com.au  Fact Sheet for Healthcare Providers:  IncredibleEmployment.be  This test is no t yet approved or  cleared by the [place] FDA and  has been authorized for detection and/or diagnosis of SARS-CoV-2 by FDA under an Emergency Use Authorization (EUA). This EUA will remain  in effect (meaning this test can be used) for the duration of the COVID-19 declaration under Section 564(b)(1) of the Act, 21 U.S.C.section 360bbb-3(b)(1), unless the authorization is terminated  or revoked sooner.       Influenza A by PCR NEGATIVE NEGATIVE Final   Influenza B by PCR NEGATIVE NEGATIVE Final    Comment: (NOTE) The Xpert Xpress SARS-CoV-2/FLU/RSV plus assay is intended as an aid in the diagnosis of influenza from Nasopharyngeal swab specimens and should not be used as a sole basis for treatment. Nasal washings and aspirates are unacceptable for Xpert Xpress SARS-CoV-2/FLU/RSV testing.  Fact Sheet for Patients: EntrepreneurPulse.com.au  Fact Sheet for Healthcare Providers: IncredibleEmployment.be  This test is not yet approved or cleared by the [place] and has been authorized for detection and/or diagnosis of SARS-CoV-2 by FDA under an Emergency Use Authorization (EUA). This EUA will remain in effect (meaning this test can be used) for the duration of the COVID-19 declaration under Section 564(b)(1) of the Act, 21 U.S.C. section 360bbb-3(b)(1), unless the authorization is terminated or revoked.  Performed at [facility], [address] [place], [place] [address]      Scheduled Meds: . sucralfate  1 g Oral TID WC & HS   Continuous Infusions: . sodium chloride 125 mL/hr at 10/19/20 0600  . pantoprozole (PROTONIX) infusion 8 mg/hr (10/18/20 1624)    Procedures/Studies: CT ABDOMEN PELVIS W CONTRAST  Result Date: 10/15/2020 CLINICAL DATA:  65 year old female with abdominal pain for 6 days. History of exploratory laparotomy, perforated gastric ulcer in August 2021. EXAM: CT ABDOMEN AND PELVIS WITH CONTRAST TECHNIQUE: Multidetector CT imaging  of the abdomen and pelvis was performed using the standard protocol following bolus administration of intravenous contrast. CONTRAST:  189mL OMNIPAQUE IOHEXOL 300 MG/ML  SOLN COMPARISON:  CT Abdomen and Pelvis 09/24/2020 and earlier, including MRI 04/22/2020. FINDINGS: Lower chest: Stable lung bases and cardiac size at the upper limits of normal. No pericardial or pleural effusion. Hepatobiliary: Chronic cholelithiasis with gallstones individually up to 19 mm. Fat at the porta hepatis and the gallbladder wall remain indistinct similar to earlier this month. No progressive gallbladder wall thickening. No intrahepatic biliary ductal dilatation. Stable liver enhancement including tiny low-density area near the dome on the right which has a benign appearance. No perihepatic free fluid. Pancreas: Multi cystic, partially calcified mass of the pancreatic head measuring up to 43 mm long axis (series 2, image 29) appears larger since August 2021 by up to 10 mm. Main pancreatic duct also appears more prominent since that time. But there is no convincing primary pancreatic inflammation. No free fluid in the lesser sac. Spleen: Negative. Adrenals/Urinary Tract: Normal adrenal glands. Both kidneys are stable since August including mildly complex partially calcified exophytic right upper pole cystic lesion - which showed no suspicious enhancement on August MRI. Superimposed benign appearing parapelvic and left renal midpole cysts. Normal renal contrast excretion on delayed images. No perinephric stranding. No definite nephrolithiasis. Decompressed ureters. Negative urinary bladder. Chronic pelvic phleboliths. Stomach/Bowel: Negative rectum. Extensive sigmoid diverticulosis with no active inflammation. Redundant transverse colon. Retained stool in the transverse and right colon. Cecum is on a lax mesentery located in the right mid abdomen. Normal appendix visible on series 5, image 30. No large bowel inflammation. Negative  terminal ileum. No dilated small bowel. Small gastric hiatal hernia. No free air. No free fluid identified. But there remains an indistinct appearance of the gastric antrum and proximal duodenum (series 2, image 24 and series 5, image 24) with increased mesenteric stranding from earlier this month. Regional mucosal hyperenhancement and wall thickening. No extraluminal gas identified. Vascular/Lymphatic: Major arterial structures in the abdomen and pelvis are patent and mildly tortuous. Mild Aortoiliac calcified atherosclerosis. Portal venous system is patent. No lymphadenopathy. Reproductive: Lobulated, fibroid uterus including multiple exophytic, subserosal fibroids anteriorly and at the fundus which are individually up to 37 mm diameter and not significantly changed since 04/20/2020. Adnexa appear stable and within normal limits. Other: No pelvic free fluid. Chronic postoperative changes to the ventral upper abdomen are stable. Musculoskeletal: Intermittent advanced disc and endplate degeneration in the visible spine. Grade 1 lumbar spondylolisthesis. No acute osseous abnormality identified. IMPRESSION: 1. 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 This was evaluated by MRI in August 2021 (please see that report), and appears mildly larger (4.3 cm) with increased conspicuity of the pancreatic duct since that time. 2. Continued inflamed appearance of the distal stomach and proximal duodenum compatible with Recurrent Acute Peptic Ulcer Disease. Adjacent mesenteric fat stranding seems increased since 09/24/2020, although there is no evidence of perforation. 3. Persistent mildly inflamed appearance of the gallbladder and porta hepatis region also, with underlying Cholelithiasis. 4. Otherwise stable abdomen and pelvis, including renal cysts which had a benign MRI appearance, sigmoid diverticulosis, fibroid uterus. Electronically Signed   By: Genevie Ann M.D.   On: 10/15/2020 04:42   CT Abdomen  Pelvis W Contrast  Result Date: 09/24/2020 CLINICAL DATA:  Nausea, vomiting and diarrhea for 2 days EXAM: CT ABDOMEN AND PELVIS WITH CONTRAST TECHNIQUE: Multidetector CT imaging of the abdomen and pelvis was performed using the standard protocol following bolus administration of intravenous contrast. CONTRAST:  153mL OMNIPAQUE IOHEXOL 300 MG/ML  SOLN COMPARISON:  CT 08/07/2020 04/22/2020 FINDINGS: Lower chest: Atelectatic changes in the wrist clear lung bases. Cardiac size is top normal. No pericardial effusion. Few coronary artery calcifications. Hepatobiliary: Few subcentimeter hypoattenuating foci in the liver too small to fully characterize on CT imaging but statistically likely benign. No concerning focal liver lesions. Smooth liver surface contour. Normal liver attenuation. Gallbladder contains multiple partially calcified gallstones several of which appear partially pneumatized as well. There is some increasing pericholecystic inflammatory change when compared to prior imaging. No visible intraductal gallstones or biliary ductal dilatation is seen however. Pancreas: Redemonstration of the multilobulated cystic lesion in the head of the pancreas with few punctate calcifications again measuring up to 3.5 by 4.0 cm in maximum transaxial dimension, not significantly changed from comparison studies. Previously characterized on prior MR imaging 04/22/2020 with a differential including mucinous cystic neoplasm versus sequela of prior pancreatitis. Remaining portions of the pancreas are unremarkable without new or concerning pancreatic lesion, ductal dilatation, or peripancreatic inflammation. Spleen: Normal in size. No concerning splenic lesions. Adrenals/Urinary Tract: Normal adrenal glands. Kidneys are normal in size and position. Multiple simple and parapelvic cysts are noted bilaterally. A slightly more complex, partially exophytic peripherally calcified cystic lesion measuring up to 1.8 cm is seen arising from  the upper pole right kidney also previously characterized on prior MR imaging is a complex though likely benign cyst. No new or concerning renal lesions. No frank hydronephrosis or obstructive urolithiasis. There is some mild faint perivesicular hazy stranding and some questionable layering debris versus asymmetric thickening of the left posterolateral bladder wall. Stomach/Bowel: Small sliding-type hiatal hernia. Proximal stomach is unremarkable. There is a circumferentially thickened appearance of the duodenal fold and gastric antrum. Mild mucosal hyperemia and possible ulceration (5/23) is present as well. More distal duodenum is unremarkable. No other small bowel thickening or dilatation. A normal appendix is visualized. No colonic dilatation or wall thickening. Scattered colonic diverticula without focal inflammation to suggest diverticulitis. Relative lack of formed stool could reflect a rapid transit state/diarrheal illness. Vascular/Lymphatic: Atherosclerotic calcification in the iliac arteries. Other significant vascular findings. No suspicious or enlarged lymph nodes in the included lymphatic chains. Reproductive: Lobular, heterogeneously enhancing lesions with uterus including both intramural and partially exophytic foci keeping with a diagnosis of uterine leiomyomata. No concerning adnexal lesions. Other: No abdominopelvic free fluid or free gas. No bowel containing hernias. Prior vertical midline incision. Mild circumferential body wall edema most pronounced posteriorly. Musculoskeletal: Multilevel degenerative changes are present  in the imaged portions of the spine. More sclerotic changes about a region of disc desiccation at the T11-12 level is similar to prior and favored to be on a degenerative basis. Grade 1 anterolisthesis L4 on 5 without associated spondylolysis. 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 Additional moderate degenerative changes in the  hips and pelvis. No acute or conspicuous osseous lesions. IMPRESSION: 1. Circumferentially thickened appearance of the gastric antrum and duodenal bulb with some anemia mucosal hyperemia which could reflect some gastritis/duodenitis with a site of possible ulceration. No evidence of frank perforation or abscess. Correlate with endoscopy as clinically warranted. 2. Cholelithiasis with some slightly increasing pericholecystic inflammatory change when compared to prior imaging. If there is clinical concern for acute cholecystitis, recommend further evaluation with right upper quadrant ultrasound. 3. Relative lack of formed stool could reflect a rapid transit state/diarrheal illness. 4. Unchanged appearance of the multilobulated cystic lesion in the head of the pancreas with few punctate calcifications. Previously characterized on prior MR imaging 04/22/2020 with a differential including mucinous cystic neoplasm versus sequela of prior pancreatitis. 5. Minimally complex exophytic cystic lesion from the right kidney, previously characterized on MR imaging is a probable benign cyst. Additional fluid attenuation cysts in both kidneys. Acute urinary tract abnormality. 6. Fibroid uterus. 7. Aortic Atherosclerosis (ICD10-I70.0). Electronically Signed   By: Lovena Le M.D.   On: 09/24/2020 02:36   US Abdomen Limited  Result Date: 10/18/2020 CLINICAL DATA:  Abdominal pain.  History of gallbladder stones. EXAM: ULTRASOUND ABDOMEN LIMITED RIGHT UPPER QUADRANT COMPARISON:  CT of the abdomen and pelvis on 10/15/2020 and ultrasound of the abdomen 09/24/2020 FINDINGS: Gallbladder: Gallbladder wall is mildly thickened, 3.6 millimeters. There are numerous stones within the gallbladder, measuring up to 1.5 centimeters. No pericholecystic fluid. Note is made of a positive sonographic Murphy's sign. Common bile duct: Diameter: 5.8 millimeters Liver: Focal areas of increased attenuation particularly within the gallbladder fossa,  consistent with focal fatty infiltration. No focal liver lesions are identified. Portal vein is patent on color Doppler imaging with normal direction of blood flow towards the liver. Other: None. IMPRESSION: 1. Cholelithiasis and mild gallbladder wall thickening. 2. Sonographic Murphy's sign is present today, new since the previous ultrasound exam. Consider acute cholecystitis. 3. There is no pericholecystic fluid. 4. Focal fatty infiltration of the liver. These results will be called to the ordering clinician or representative by the Radiologist Assistant, and communication documented in the PACS or [employer]. Electronically Signed   By: Nolon Nations M.D.   On: 10/18/2020 10:22   DG Chest Port 1 View  Result Date: 09/24/2020 CLINICAL DATA:  Shortness of breath and cough EXAM: PORTABLE CHEST 1 VIEW COMPARISON:  05/02/2020 FINDINGS: Cardiac shadow is stable. Lungs are well aerated bilaterally. No focal infiltrate or sizable effusion is seen. No bony abnormality is noted. IMPRESSION: No active disease. Electronically Signed   By: Inez Catalina M.D.   On: 09/24/2020 01:25   DG Abdomen Acute W/Chest  Result Date: 10/18/2020 CLINICAL DATA:  Abdominal pain post surgery for perirectal abscess EXAM: DG ABDOMEN ACUTE WITH 1 VIEW CHEST COMPARISON:  Radiograph 10/16/2020, CT 10/15/2020 FINDINGS: Streaky basilar opacities and low volumes favoring atelectasis. No consolidation, features of edema, pneumothorax, or effusion. Pulmonary vascularity is normally distributed. The cardiomediastinal contours are unremarkable. No subdiaphragmatic free air. Redemonstrated calcification at the level of the pancreatic head corresponding to a lesion on comparison CT. There is increasing air distention of the stomach and mid abdominal small  bowel with a moderate to large colonic stool burden. Few calcified gallstones are less well visualized on this exam. No acute osseous abnormality. Degenerative changes in the shoulders,  spine, hips and pelvis. The osseous structures appear diffusely demineralized which may limit detection of small or nondisplaced fractures. No worrisome findings of the remaining soft tissues. IMPRESSION: 1. Moderate to large colonic stool burden with increasing air distention of the stomach and mid abdominal small bowel possibly related to constipation though could reflect an early or developing ileus in the appropriate clinical setting. No free air. 2. Calcification of the level of the pancreatic head corresponding to a pancreatic mass on comparison CT. 3. Lung volumes are low with streaky basilar opacities favoring atelectasis. Electronically Signed   By: Lovena Le M.D.   On: 10/18/2020 04:09   DG Abdomen Acute W/Chest  Result Date: 10/16/2020 CLINICAL DATA:  Abdominal pain, change EXAM: DG ABDOMEN ACUTE WITH 1 VIEW CHEST COMPARISON:  CT 10/15/2020, ultrasound 09/24/2020, radiograph 09/24/2020 FINDINGS: Some mildly coarsened interstitial changes are similar to prior with basilar atelectasis. No consolidation, features of edema, pneumothorax, or effusion. Unchanged cardiomediastinal contours. No subdiaphragmatic free air. No high-grade obstructive bowel gas pattern. Moderate colonic stool burden. Redemonstration of the calcification at the level of the pancreatic head associated with a cystic pancreatic neoplasm on comparison CT imaging. Few calcified gallstones are noted as well. Vascular calcium of the abdomen and pelvis. Few phleboliths in the deep pelvis. Extensive degenerative changes in the shoulders, spine, hips and pelvis. The osseous structures appear diffusely demineralized which may limit detection of small or nondisplaced fractures. No acute osseous abnormality or suspicious osseous lesion. IMPRESSION: 1. 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 No acute cardiopulmonary abnormality. 2. Nonobstructive bowel gas pattern. Moderate colonic stool burden. 3.  Calcification associated with the pancreatic lesion on comparison CT. Partly visualized gallstones as well. Electronically Signed   By: Lovena Le M.D.   On: 10/16/2020 05:26   US ABDOMEN LIMITED RUQ (LIVER/GB)  Result Date: 09/24/2020 CLINICAL DATA:  Abdominal pain.  History of gallstones. EXAM: ULTRASOUND ABDOMEN LIMITED RIGHT UPPER QUADRANT COMPARISON:  Ultrasound 07/28/2020.  CT 07/28/2020. FINDINGS: Gallbladder: Gallstones again noted. Largest measures 1.6 cm. Gallbladder wall is minimally prominent at 3 mm. Trace pericholecystic fluid. Negative Murphy sign. Common bile duct: Diameter: 6 mm Liver: Increased echogenicity consistent fatty infiltration or hepatocellular disease. Portal vein is patent on color Doppler imaging with normal direction of blood flow towards the liver. Other: None. IMPRESSION: 1. Gallstones again noted. Largest measures 1.6 cm. Gallbladder wall is minimally prominent at 3 mm. Trace pericholecystic fluid. Cholecystitis cannot be excluded. Negative Murphy sign. No biliary distention. 2. Increased echogenicity of the liver consistent fatty infiltration or hepatocellular disease. Electronically Signed   By: Marcello Moores  Register   On: 09/24/2020 09:15    Orson Eva, DO  [facility]  If 7PM-7AM, please contact night-coverage [email or web address] Password [email or web address] 10/19/2020, 2:45 PM   LOS: 1 day

## 2020-10-20 DIAGNOSIS — K2961 Other gastritis with bleeding: Secondary | ICD-10-CM

## 2020-10-20 DIAGNOSIS — Z86711 Personal history of pulmonary embolism: Secondary | ICD-10-CM | POA: Diagnosis not present

## 2020-10-20 DIAGNOSIS — R1011 Right upper quadrant pain: Secondary | ICD-10-CM | POA: Diagnosis not present

## 2020-10-20 DIAGNOSIS — K269 Duodenal ulcer, unspecified as acute or chronic, without hemorrhage or perforation: Secondary | ICD-10-CM | POA: Diagnosis not present

## 2020-10-20 DIAGNOSIS — K922 Gastrointestinal hemorrhage, unspecified: Secondary | ICD-10-CM | POA: Diagnosis not present

## 2020-10-20 DIAGNOSIS — I10 Essential (primary) hypertension: Secondary | ICD-10-CM | POA: Diagnosis not present

## 2020-10-20 LAB — CBC
HCT: 30.3 % — ABNORMAL LOW (ref 36.0–46.0)
Hemoglobin: 9.6 g/dL — ABNORMAL LOW (ref 12.0–15.0)
MCH: 27.9 pg (ref 26.0–34.0)
MCHC: 31.7 g/dL (ref 30.0–36.0)
MCV: 88.1 fL (ref 80.0–100.0)
Platelets: 173 10*3/uL (ref 150–400)
RBC: 3.44 MIL/uL — ABNORMAL LOW (ref 3.87–5.11)
RDW: 16.3 % — ABNORMAL HIGH (ref 11.5–15.5)
WBC: 3.1 10*3/uL — ABNORMAL LOW (ref 4.0–10.5)
nRBC: 0 % (ref 0.0–0.2)

## 2020-10-20 LAB — BASIC METABOLIC PANEL
Anion gap: 5 (ref 5–15)
BUN: 5 mg/dL — ABNORMAL LOW (ref 8–23)
CO2: 25 mmol/L (ref 22–32)
Calcium: 8.5 mg/dL — ABNORMAL LOW (ref 8.9–10.3)
Chloride: 111 mmol/L (ref 98–111)
Creatinine, Ser: 0.72 mg/dL (ref 0.44–1.00)
GFR, Estimated: >60 mL/min (ref >60)
Glucose, Bld: 86 mg/dL (ref 70–99)
Potassium: 3.5 mmol/L (ref 3.5–5.1)
Sodium: 141 mmol/L (ref 135–145)

## 2020-10-20 LAB — MAGNESIUM: Magnesium: 2 mg/dL (ref 1.7–2.4)

## 2020-10-20 LAB — GASTRIN: Gastrin: 19 pg/mL (ref 0–115)

## 2020-10-20 MED ORDER — PANTOPRAZOLE SODIUM 40 MG IV SOLR
40.0000 mg | Freq: Two times a day (BID) | INTRAVENOUS | Status: DC
  Administered 2020-10-21 – 2020-10-23 (×5): 40 mg via INTRAVENOUS
  Filled 2020-10-20 (×6): qty 40

## 2020-10-20 NOTE — Progress Notes (Addendum)
PROGRESS NOTE  Maria Murphy INO:[id number] DOB: 01-Mar-1956 DOA: 10/18/2020 PCP: Ladell Pier, MD   Brief History: 65 year old female with history of hypertension, peptic ulcer disease with perforation, saddle pulmonary embolus on rivaroxaban, depression presenting with 10-day history of intermittent abdominal pain and nausea and vomiting. The patient states that she has had approximately 2-3 episodes of emesis daily with upper abdominal pain in the epigastric and left upper quadrant region. She denies any fevers, chills, chest pain, shortness breath, coughing, hemoptysis. She has not had any hematemesis. She has had loose stools with occasional melena. The patient has had 2 recent emergency department visits. 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 There was also more prominent main pancreatic duct.There is also an inflamed appearance of the distal stomach and proximal duodenum with adjacent fat stranding that was increased since 09/24/2020. There is mild inflamed appearance of the gallbladder and porta hepatis. The patient was discharged home in stable condition after fluids and conservative care in the emergency department with a prescription for oxycodone. The patient endorses compliance with her Protonix and Carafate.  The patient states that she has not taken any NSAIDs recently. She endorses compliance with her rivaroxaban. Notably, the patient has had a complicated history of peptic ulcer disease secondary to NSAID use. She had a prolonged hospitalization from 04/21/2020 to 05/17/2020 secondary to a perforated gastric ulcer. She underwent exploratory laparotomy with Phillip Heal patch placed on 04/22/2021. This was complicated by intra-abdominal abscess for which required IR aspiration. That hospitalization was also complicated by a pulmonary embolus for which the  patient was started on rivaroxaban. She was subsequently remitted to the hospital on 07/05/2020 to 07/12/2020 for intractable nausea and vomiting. Fortunately, the patient improved with conservative/nonoperative management. She underwent EGD on 07/10/2020 which showed a scar in the gastric antrum and prepyloric region. There was nonbleeding duodenal ulcers. Once again, the patient was readmitted from 09/23/2020 to2/4/22 for abdominal pain and intractable nausea and vomiting. Again, the patient improved with conservative care.  In the emergency department, the patient was afebrile hemodynamically stable with oxygen saturation 100% room air. Sodium was 137, potassium 3.9, serum creatinine 0.81. LFTs were unremarkable. Lipase was 31. WBC 6.6, hemoglobin 9.9, platelets 208,000. The patient was started on a Protonix drip. GI was consulted to assist with management.  Assessment/Plan: Intractable abdominal pain/vomiting/melena -Suspect this is secondary to her peptic ulcer disease -Continue Protonix drip through 10/20/20 -GI consult appreciated -10/18/20 EGD--nonbleeding gastric ulcers; large duodenal ulcer in the duodenal bulb, close to 35 mm in diameter; no presence of stigmata of recent bleeding but there was presence of a suture at the bed of the ulcer which was semicircumferential -Avoid NSAIDs -Holding rivaroxaban temporarily -10/15/2020 CT abdomen--as discussed above -10/18/20 AXR--mod to large stool burden with increase air distension of stomach to mid SB -10/19/20-discussed with GI, Dr. Abbey Chatters -10/20/20--still had 1 or 2 episodes n/v with clears  Inflamed gallbladder appearance -09/18/2020 HIDA scan normal -10/18/20 RUQ US--Cholelithiasis and mild gallbladder wall thickening. +murphy's -Previously seen by surgery who did not feel that her clinical presentation was consistent with cholecystitis -appreciate general consult-->did not feel patient has cholecysitis  Pancreatic head  mass -This has been followed by surveillance by GI  ABLA -baseline Hgb 10-11 -Hgb nadir 8.8 -Hgb now stable  History of pulmonary embolus -Patient had submassive/saddle embolus on CTA on  05/01/2020 -Temporarily holding rivaroxaban -patient has been taking faithfully since beginning of Nov 2021 -plan 6 full months of tx-->end of April  Essential hypertension -Holding lisinopril HCTZ for now -Monitor BP -IV labetalol as neededSBP >180  Depression -Restart Celexa and trazodone once able to tolerate p.o. -Restart Remeron once able to tolerate p.o.      Status is: Inpatient  Remains inpatient appropriate because:IV treatments appropriate due to intensity of illness or inability to take PO   Dispo: The patient is from:Home Anticipated d/c is KP:T[facility] Patient currently is not medically stable to d/c. Difficult to place patient No        Family Communication:sister updated at bedside 2/26 Consultants:GI  Code Status: FULL  DVT Prophylaxis:Xarelto on hold   Procedures: As Listed in Progress Note Above  Antibiotics: None    Status is: Inpatient  Remains inpatient appropriate because:IV treatments appropriate due to intensity of illness or inability to take PO   Dispo: The patient is from: Home  Anticipated d/c is to: Home  Patient currently is not medically stable to d/c.              Difficult to place patient No       Subjective: Patient denies fevers, chills, headache, chest pain, dyspnea, nausea, vomiting, diarrhea, abdominal pain, dysuria, hematuria, hematochezia, and melena.   Objective: Vitals:   10/19/20 2126 10/20/20 0603 10/20/20 1241 10/20/20 1403  BP: 117/63 (!) 98/56 134/79 126/65  Pulse: (!) 59 (!) 50 (!) 56 (!) 56  Resp: 18 18 18 18   Temp: 98.5 F (36.9 C) 97.8 F (36.6 C) 97.9 F (36.6 C) 97.6 F (36.4 C)  TempSrc: Oral  Oral Oral Oral  SpO2: 94% 95% 98% 95%  Weight:      Height:       No intake or output data in the 24 hours ending 10/20/20 1702 Weight change:  Exam:   General:  Pt is alert, follows commands appropriately, not in acute distress  HEENT: No icterus, No thrush, No neck mass, [place]/AT  Cardiovascular: RRR, S1/S2, no rubs, no gallops  Respiratory: CTA bilaterally, no wheezing, no crackles, no rhonchi  Abdomen: Soft/+BS, non tender, non distended, no guarding  Extremities: No edema, No lymphangitis, No petechiae, No rashes, no synovitis   Data Reviewed: I have personally reviewed following labs and imaging studies Basic Metabolic Panel: Recent Labs  Lab 10/15/20 0219 10/16/20 0439 10/18/20 0319 10/19/20 0452 10/20/20 0601  NA 136 137 137 141 141  K 4.1 3.9 3.9 3.9 3.5  CL 104 106 107 111 111  CO2 23 22 22 22 25   GLUCOSE 116* 116* 121* 91 86  BUN 20 21 16  6* 5*  CREATININE 0.84 0.72 0.81 0.58 0.72  CALCIUM 9.5 9.3 8.8* 8.7* 8.5*  MG  --   --  2.1  --  2.0  PHOS  --   --  3.7  --   --    Liver Function Tests: Recent Labs  Lab 10/15/20 0219 10/16/20 0439 10/18/20 0319 10/19/20 0452  AST 21 16 20 15   ALT 8 9 7 6   ALKPHOS 47 42 43 33*  BILITOT 0.6 0.4 0.6 0.4  PROT 7.9 7.1 6.6 5.3*  ALBUMIN 4.6 4.1 4.0 3.1*   Recent Labs  Lab 10/15/20 0219 10/16/20 0439 10/18/20 0319  LIPASE 41 30 31   No results for input(s): AMMONIA in the last 168 hours. Coagulation Profile: Recent Labs  Lab 10/18/20 0319 10/19/20 0452  INR 1.0 1.1  CBC: Recent Labs  Lab 10/15/20 0219 10/16/20 0439 10/18/20 0319 10/19/20 0452 10/20/20 0601  WBC 6.9 6.6 6.6 3.3* 3.1*  NEUTROABS  --  5.0 4.4  --   --   HGB 12.2 11.1* 9.9* 8.8* 9.6*  HCT 37.2 34.9* 31.4* 28.1* 30.3*  MCV 85.5 86.2 87.5 89.2 88.1  PLT 226 199 208 167 173   Cardiac Enzymes: No results for input(s): CKTOTAL, CKMB, CKMBINDEX, TROPONINI in the last 168 hours. BNP: Invalid input(s): POCBNP CBG: No results for  input(s): GLUCAP in the last 168 hours. HbA1C: No results for input(s): HGBA1C in the last 72 hours. Urine analysis:    Component Value Date/Time   COLORURINE YELLOW 10/18/2020 [place] 10/18/2020 0235   LABSPEC 1.013 10/18/2020 0235   PHURINE 6.0 10/18/2020 0235   GLUCOSEU NEGATIVE 10/18/2020 0235   HGBUR NEGATIVE 10/18/2020 0235   BILIRUBINUR NEGATIVE 10/18/2020 0235   KETONESUR NEGATIVE 10/18/2020 0235   PROTEINUR NEGATIVE 10/18/2020 0235   NITRITE NEGATIVE 10/18/2020 0235   LEUKOCYTESUR SMALL (A) 10/18/2020 0235   Sepsis Labs: @LABRCNTIP (procalcitonin:4,lacticidven:4) ) Recent Results (from the past 240 hour(s))  Resp Panel by RT-PCR (Flu A&B, Covid) Nasopharyngeal Swab     Status: None   Collection Time: 10/18/20  3:52 AM   Specimen: Nasopharyngeal Swab; Nasopharyngeal(NP) swabs in vial transport medium  Result Value Ref Range Status   SARS Coronavirus 2 by RT PCR NEGATIVE NEGATIVE Final    Comment: (NOTE) SARS-CoV-2 target nucleic acids are NOT DETECTED.  The SARS-CoV-2 RNA is generally detectable in upper respiratory specimens during the acute phase of infection. The lowest concentration of SARS-CoV-2 viral copies this assay can detect is 138 copies/mL. A negative result does not preclude SARS-Cov-2 infection and should not be used as the sole basis for treatment or other patient management decisions. A negative result may occur with  improper specimen collection/handling, submission of specimen other than nasopharyngeal swab, presence of viral mutation(s) within the areas targeted by this assay, and inadequate number of viral copies(<138 copies/mL). A negative result must be combined with clinical observations, patient history, and epidemiological information. The expected result is Negative.  Fact Sheet for Patients:  EntrepreneurPulse.com.au  Fact Sheet for Healthcare Providers:   IncredibleEmployment.be  This test is no t yet approved or cleared by the [place] FDA and  has been authorized for detection and/or diagnosis of SARS-CoV-2 by FDA under an Emergency Use Authorization (EUA). This EUA will remain  in effect (meaning this test can be used) for the duration of the COVID-19 declaration under Section 564(b)(1) of the Act, 21 U.S.C.section 360bbb-3(b)(1), unless the authorization is terminated  or revoked sooner.       Influenza A by PCR NEGATIVE NEGATIVE Final   Influenza B by PCR NEGATIVE NEGATIVE Final    Comment: (NOTE) The Xpert Xpress SARS-CoV-2/FLU/RSV plus assay is intended as an aid in the diagnosis of influenza from Nasopharyngeal swab specimens and should not be used as a sole basis for treatment. Nasal washings and aspirates are unacceptable for Xpert Xpress SARS-CoV-2/FLU/RSV testing.  Fact Sheet for Patients: EntrepreneurPulse.com.au  Fact Sheet for Healthcare Providers: IncredibleEmployment.be  This test is not yet approved or cleared by the [place] FDA and has been authorized for detection and/or diagnosis of SARS-CoV-2 by FDA under an Emergency Use Authorization (EUA). This EUA will remain in effect (meaning this test can be used) for the duration of the COVID-19 declaration under Section 564(b)(1) of the Act, 21 U.S.C. section 360bbb-3(b)(1), unless  the authorization is terminated or revoked.  Performed at [facility], [address], [place], [place] [address]      Scheduled Meds: . citalopram  20 mg Oral Daily  . mirtazapine  15 mg Oral QHS  . sucralfate  1 g Oral TID WC & HS   Continuous Infusions: . pantoprozole (PROTONIX) infusion 8 mg/hr (10/20/20 0246)    Procedures/Studies: CT ABDOMEN PELVIS W CONTRAST  Result Date: 10/15/2020 CLINICAL DATA:  65 year old female with abdominal pain for 6 days. History of exploratory laparotomy, perforated gastric  ulcer in August 2021. EXAM: CT ABDOMEN AND PELVIS WITH CONTRAST TECHNIQUE: Multidetector CT imaging of the abdomen and pelvis was performed using the standard protocol following bolus administration of intravenous contrast. CONTRAST:  174mL OMNIPAQUE IOHEXOL 300 MG/ML  SOLN COMPARISON:  CT Abdomen and Pelvis 09/24/2020 and earlier, including MRI 04/22/2020. FINDINGS: Lower chest: Stable lung bases and cardiac size at the upper limits of normal. No pericardial or pleural effusion. Hepatobiliary: Chronic cholelithiasis with gallstones individually up to 19 mm. Fat at the porta hepatis and the gallbladder wall remain indistinct similar to earlier this month. No progressive gallbladder wall thickening. No intrahepatic biliary ductal dilatation. Stable liver enhancement including tiny low-density area near the dome on the right which has a benign appearance. No perihepatic free fluid. Pancreas: Multi cystic, partially calcified mass of the pancreatic head measuring up to 43 mm long axis (series 2, image 29) appears larger since August 2021 by up to 10 mm. Main pancreatic duct also appears more prominent since that time. But there is no convincing primary pancreatic inflammation. No free fluid in the lesser sac. Spleen: Negative. Adrenals/Urinary Tract: Normal adrenal glands. Both kidneys are stable since August including mildly complex partially calcified exophytic right upper pole cystic lesion - which showed no suspicious enhancement on August MRI. Superimposed benign appearing parapelvic and left renal midpole cysts. Normal renal contrast excretion on delayed images. No perinephric stranding. No definite nephrolithiasis. Decompressed ureters. Negative urinary bladder. Chronic pelvic phleboliths. Stomach/Bowel: Negative rectum. Extensive sigmoid diverticulosis with no active inflammation. Redundant transverse colon. Retained stool in the transverse and right colon. Cecum is on a lax mesentery located in the right mid  abdomen. Normal appendix visible on series 5, image 30. No large bowel inflammation. Negative terminal ileum. No dilated small bowel. Small gastric hiatal hernia. No free air. No free fluid identified. But there remains an indistinct appearance of the gastric antrum and proximal duodenum (series 2, image 24 and series 5, image 24) with increased mesenteric stranding from earlier this month. Regional mucosal hyperenhancement and wall thickening. No extraluminal gas identified. Vascular/Lymphatic: Major arterial structures in the abdomen and pelvis are patent and mildly tortuous. Mild Aortoiliac calcified atherosclerosis. Portal venous system is patent. No lymphadenopathy. Reproductive: Lobulated, fibroid uterus including multiple exophytic, subserosal fibroids anteriorly and at the fundus which are individually up to 37 mm diameter and not significantly changed since 04/20/2020. Adnexa appear stable and within normal limits. Other: No pelvic free fluid. Chronic postoperative changes to the ventral upper abdomen are stable. Musculoskeletal: Intermittent advanced disc and endplate degeneration in the visible spine. Grade 1 lumbar spondylolisthesis. No acute osseous abnormality identified. IMPRESSION: 1. 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 This was evaluated by MRI in August 2021 (please see that report), and appears mildly larger (4.3 cm) with increased conspicuity of the pancreatic duct since that time. 2. Continued inflamed appearance of the distal stomach and proximal duodenum compatible with Recurrent Acute Peptic Ulcer Disease. Adjacent  mesenteric fat stranding seems increased since 09/24/2020, although there is no evidence of perforation. 3. Persistent mildly inflamed appearance of the gallbladder and porta hepatis region also, with underlying Cholelithiasis. 4. Otherwise stable abdomen and pelvis, including renal cysts which had a benign MRI appearance, sigmoid diverticulosis,  fibroid uterus. Electronically Signed   By: Genevie Ann M.D.   On: 10/15/2020 04:42   CT Abdomen Pelvis W Contrast  Result Date: 09/24/2020 CLINICAL DATA:  Nausea, vomiting and diarrhea for 2 days EXAM: CT ABDOMEN AND PELVIS WITH CONTRAST TECHNIQUE: Multidetector CT imaging of the abdomen and pelvis was performed using the standard protocol following bolus administration of intravenous contrast. CONTRAST:  116mL OMNIPAQUE IOHEXOL 300 MG/ML  SOLN COMPARISON:  CT 08/07/2020 04/22/2020 FINDINGS: Lower chest: Atelectatic changes in the wrist clear lung bases. Cardiac size is top normal. No pericardial effusion. Few coronary artery calcifications. Hepatobiliary: Few subcentimeter hypoattenuating foci in the liver too small to fully characterize on CT imaging but statistically likely benign. No concerning focal liver lesions. Smooth liver surface contour. Normal liver attenuation. Gallbladder contains multiple partially calcified gallstones several of which appear partially pneumatized as well. There is some increasing pericholecystic inflammatory change when compared to prior imaging. No visible intraductal gallstones or biliary ductal dilatation is seen however. Pancreas: Redemonstration of the multilobulated cystic lesion in the head of the pancreas with few punctate calcifications again measuring up to 3.5 by 4.0 cm in maximum transaxial dimension, not significantly changed from comparison studies. Previously characterized on prior MR imaging 04/22/2020 with a differential including mucinous cystic neoplasm versus sequela of prior pancreatitis. Remaining portions of the pancreas are unremarkable without new or concerning pancreatic lesion, ductal dilatation, or peripancreatic inflammation. Spleen: Normal in size. No concerning splenic lesions. Adrenals/Urinary Tract: Normal adrenal glands. Kidneys are normal in size and position. Multiple simple and parapelvic cysts are noted bilaterally. A slightly more complex,  partially exophytic peripherally calcified cystic lesion measuring up to 1.8 cm is seen arising from the upper pole right kidney also previously characterized on prior MR imaging is a complex though likely benign cyst. No new or concerning renal lesions. No frank hydronephrosis or obstructive urolithiasis. There is some mild faint perivesicular hazy stranding and some questionable layering debris versus asymmetric thickening of the left posterolateral bladder wall. Stomach/Bowel: Small sliding-type hiatal hernia. Proximal stomach is unremarkable. There is a circumferentially thickened appearance of the duodenal fold and gastric antrum. Mild mucosal hyperemia and possible ulceration (5/23) is present as well. More distal duodenum is unremarkable. No other small bowel thickening or dilatation. A normal appendix is visualized. No colonic dilatation or wall thickening. Scattered colonic diverticula without focal inflammation to suggest diverticulitis. Relative lack of formed stool could reflect a rapid transit state/diarrheal illness. Vascular/Lymphatic: Atherosclerotic calcification in the iliac arteries. Other significant vascular findings. No suspicious or enlarged lymph nodes in the included lymphatic chains. Reproductive: Lobular, heterogeneously enhancing lesions with uterus including both intramural and partially exophytic foci keeping with a diagnosis of uterine leiomyomata. No concerning adnexal lesions. Other: No abdominopelvic free fluid or free gas. No bowel containing hernias. Prior vertical midline incision. Mild circumferential body wall edema most pronounced posteriorly. Musculoskeletal: Multilevel degenerative changes are present in the imaged portions of the spine. More sclerotic changes about a region of disc desiccation at the T11-12 level is similar to prior and favored to be on a degenerative basis. Grade 1 anterolisthesis L4 on 5 without associated spondylolysis. 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 Additional  moderate degenerative changes in the hips and pelvis. No acute or conspicuous osseous lesions. IMPRESSION: 1. Circumferentially thickened appearance of the gastric antrum and duodenal bulb with some anemia mucosal hyperemia which could reflect some gastritis/duodenitis with a site of possible ulceration. No evidence of frank perforation or abscess. Correlate with endoscopy as clinically warranted. 2. Cholelithiasis with some slightly increasing pericholecystic inflammatory change when compared to prior imaging. If there is clinical concern for acute cholecystitis, recommend further evaluation with right upper quadrant ultrasound. 3. Relative lack of formed stool could reflect a rapid transit state/diarrheal illness. 4. Unchanged appearance of the multilobulated cystic lesion in the head of the pancreas with few punctate calcifications. Previously characterized on prior MR imaging 04/22/2020 with a differential including mucinous cystic neoplasm versus sequela of prior pancreatitis. 5. Minimally complex exophytic cystic lesion from the right kidney, previously characterized on MR imaging is a probable benign cyst. Additional fluid attenuation cysts in both kidneys. Acute urinary tract abnormality. 6. Fibroid uterus. 7. Aortic Atherosclerosis (ICD10-I70.0). Electronically Signed   By: Lovena Le M.D.   On: 09/24/2020 02:36   US Abdomen Limited  Result Date: 10/18/2020 CLINICAL DATA:  Abdominal pain.  History of gallbladder stones. EXAM: ULTRASOUND ABDOMEN LIMITED RIGHT UPPER QUADRANT COMPARISON:  CT of the abdomen and pelvis on 10/15/2020 and ultrasound of the abdomen 09/24/2020 FINDINGS: Gallbladder: Gallbladder wall is mildly thickened, 3.6 millimeters. There are numerous stones within the gallbladder, measuring up to 1.5 centimeters. No pericholecystic fluid. Note is made of a positive sonographic Murphy's sign. Common bile duct: Diameter: 5.8  millimeters Liver: Focal areas of increased attenuation particularly within the gallbladder fossa, consistent with focal fatty infiltration. No focal liver lesions are identified. Portal vein is patent on color Doppler imaging with normal direction of blood flow towards the liver. Other: None. IMPRESSION: 1. Cholelithiasis and mild gallbladder wall thickening. 2. Sonographic Murphy's sign is present today, new since the previous ultrasound exam. Consider acute cholecystitis. 3. There is no pericholecystic fluid. 4. Focal fatty infiltration of the liver. These results will be called to the ordering clinician or representative by the Radiologist Assistant, and communication documented in the PACS or [employer]. Electronically Signed   By: Nolon Nations M.D.   On: 10/18/2020 10:22   DG Chest Port 1 View  Result Date: 09/24/2020 CLINICAL DATA:  Shortness of breath and cough EXAM: PORTABLE CHEST 1 VIEW COMPARISON:  05/02/2020 FINDINGS: Cardiac shadow is stable. Lungs are well aerated bilaterally. No focal infiltrate or sizable effusion is seen. No bony abnormality is noted. IMPRESSION: No active disease. Electronically Signed   By: Inez Catalina M.D.   On: 09/24/2020 01:25   DG Abdomen Acute W/Chest  Result Date: 10/18/2020 CLINICAL DATA:  Abdominal pain post surgery for perirectal abscess EXAM: DG ABDOMEN ACUTE WITH 1 VIEW CHEST COMPARISON:  Radiograph 10/16/2020, CT 10/15/2020 FINDINGS: Streaky basilar opacities and low volumes favoring atelectasis. No consolidation, features of edema, pneumothorax, or effusion. Pulmonary vascularity is normally distributed. The cardiomediastinal contours are unremarkable. No subdiaphragmatic free air. Redemonstrated calcification at the level of the pancreatic head corresponding to a lesion on comparison CT. There is increasing air distention of the stomach and mid abdominal small bowel with a moderate to large colonic stool burden. Few calcified gallstones are less  well visualized on this exam. No acute osseous abnormality. Degenerative changes in the shoulders, spine, hips and pelvis. The osseous structures appear diffusely demineralized which may limit detection of small or nondisplaced fractures. No worrisome findings of the remaining  soft tissues. IMPRESSION: 1. Moderate to large colonic stool burden with increasing air distention of the stomach and mid abdominal small bowel possibly related to constipation though could reflect an early or developing ileus in the appropriate clinical setting. No free air. 2. Calcification of the level of the pancreatic head corresponding to a pancreatic mass on comparison CT. 3. Lung volumes are low with streaky basilar opacities favoring atelectasis. Electronically Signed   By: Lovena Le M.D.   On: 10/18/2020 04:09   DG Abdomen Acute W/Chest  Result Date: 10/16/2020 CLINICAL DATA:  Abdominal pain, change EXAM: DG ABDOMEN ACUTE WITH 1 VIEW CHEST COMPARISON:  CT 10/15/2020, ultrasound 09/24/2020, radiograph 09/24/2020 FINDINGS: Some mildly coarsened interstitial changes are similar to prior with basilar atelectasis. No consolidation, features of edema, pneumothorax, or effusion. Unchanged cardiomediastinal contours. No subdiaphragmatic free air. No high-grade obstructive bowel gas pattern. Moderate colonic stool burden. Redemonstration of the calcification at the level of the pancreatic head associated with a cystic pancreatic neoplasm on comparison CT imaging. Few calcified gallstones are noted as well. Vascular calcium of the abdomen and pelvis. Few phleboliths in the deep pelvis. Extensive degenerative changes in the shoulders, spine, hips and pelvis. The osseous structures appear diffusely demineralized which may limit detection of small or nondisplaced fractures. No acute osseous abnormality or suspicious osseous lesion. IMPRESSION: 1. 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 No acute  cardiopulmonary abnormality. 2. Nonobstructive bowel gas pattern. Moderate colonic stool burden. 3. Calcification associated with the pancreatic lesion on comparison CT. Partly visualized gallstones as well. Electronically Signed   By: Lovena Le M.D.   On: 10/16/2020 05:26   US ABDOMEN LIMITED RUQ (LIVER/GB)  Result Date: 09/24/2020 CLINICAL DATA:  Abdominal pain.  History of gallstones. EXAM: ULTRASOUND ABDOMEN LIMITED RIGHT UPPER QUADRANT COMPARISON:  Ultrasound 07/28/2020.  CT 07/28/2020. FINDINGS: Gallbladder: Gallstones again noted. Largest measures 1.6 cm. Gallbladder wall is minimally prominent at 3 mm. Trace pericholecystic fluid. Negative Murphy sign. Common bile duct: Diameter: 6 mm Liver: Increased echogenicity consistent fatty infiltration or hepatocellular disease. Portal vein is patent on color Doppler imaging with normal direction of blood flow towards the liver. Other: None. IMPRESSION: 1. Gallstones again noted. Largest measures 1.6 cm. Gallbladder wall is minimally prominent at 3 mm. Trace pericholecystic fluid. Cholecystitis cannot be excluded. Negative Murphy sign. No biliary distention. 2. Increased echogenicity of the liver consistent fatty infiltration or hepatocellular disease. Electronically Signed   By: Marcello Moores  Register   On: 09/24/2020 09:15    Orson Eva, DO  [facility]  If 7PM-7AM, please contact night-coverage [email or web address] Password [email or web address] 10/20/2020, 5:02 PM   LOS: 2 days

## 2020-10-20 NOTE — Progress Notes (Signed)
Subjective: Patient improved today. Tolerating current diet, pain is better. No melena or hematochezia, hgb stable  Objective: Vital signs in last 24 hours: Temp:  [97.6 F (36.4 C)-98.5 F (36.9 C)] 97.6 F (36.4 C) (02/27 1403) Pulse Rate:  [50-59] 56 (02/27 1403) Resp:  [18] 18 (02/27 1403) BP: (98-134)/(56-79) 126/65 (02/27 1403) SpO2:  [94 %-98 %] 95 % (02/27 1403) Last BM Date: 10/19/20 General:   Alert and oriented, pleasant Head:  Normocephalic and atraumatic. Eyes:  No icterus, sclera clear. Conjuctiva pink.  Mouth:  Without lesions, mucosa pink and moist.  Neck:  Supple, without thyromegaly or masses.  Abdomen:  Bowel sounds present, soft, non-tender, non-distended. No HSM or hernias noted. No rebound or guarding. No masses appreciated  Msk:  Symmetrical without gross deformities. Normal posture. Extremities:  Without clubbing or edema. Neurologic:  Alert and  oriented x4;  grossly normal neurologically. Skin:  Warm and dry, intact without significant lesions.  Cervical Nodes:  No significant cervical adenopathy. Psych:  Alert and cooperative. Normal mood and affect.  Intake/Output from previous day: 02/26 0701 - 02/27 0700 In: 240 [P.O.:240] Out: -  Intake/Output this shift: No intake/output data recorded.  Lab Results: Recent Labs    10/18/20 0319 10/19/20 0452 10/20/20 0601  WBC 6.6 3.3* 3.1*  HGB 9.9* 8.8* 9.6*  HCT 31.4* 28.1* 30.3*  PLT 208 167 173   BMET Recent Labs    10/18/20 0319 10/19/20 0452 10/20/20 0601  NA 137 141 141  K 3.9 3.9 3.5  CL 107 111 111  CO2 22 22 25   GLUCOSE 121* 91 86  BUN 16 6* 5*  CREATININE 0.81 0.58 0.72  CALCIUM 8.8* 8.7* 8.5*   LFT Recent Labs    10/18/20 0319 10/19/20 0452  PROT 6.6 5.3*  ALBUMIN 4.0 3.1*  AST 20 15  ALT 7 6  ALKPHOS 43 33*  BILITOT 0.6 0.4   PT/INR Recent Labs    10/18/20 0319 10/19/20 0452  LABPROT 13.0 13.8  INR 1.0 1.1   Hepatitis Panel No results for input(s): HEPBSAG,  HCVAB, HEPAIGM, HEPBIGM in the last 72 hours.   Studies/Results: No results found.  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 Continue to monitor daily.  Discussed case with surgery, no indication for surgical intervention at this time.  Recommend continued on PPI gtt for a total of 72 hours. Carafate 4 times daily. Will need BID PPI after gtt stopped. Discussed compliance with patient today.  Gastrin level pending  Advance diet as tolerated.  After 72 hours of IV PPI, we will plan on restarting her Xarelto. Discussed with hospitalist, plan for xarelto until April.   GI to continue to follow.    Elon Alas. Abbey Chatters, D.O. Gastroenterology and Hepatology [facility]   LOS: 2 days    10/20/2020, 3:10 PM

## 2020-10-21 ENCOUNTER — Telehealth: Payer: Self-pay | Admitting: Oncology

## 2020-10-21 DIAGNOSIS — K254 Chronic or unspecified gastric ulcer with hemorrhage: Secondary | ICD-10-CM | POA: Diagnosis not present

## 2020-10-21 DIAGNOSIS — I10 Essential (primary) hypertension: Secondary | ICD-10-CM | POA: Diagnosis not present

## 2020-10-21 DIAGNOSIS — K253 Acute gastric ulcer without hemorrhage or perforation: Secondary | ICD-10-CM

## 2020-10-21 DIAGNOSIS — R1011 Right upper quadrant pain: Secondary | ICD-10-CM | POA: Diagnosis not present

## 2020-10-21 DIAGNOSIS — K269 Duodenal ulcer, unspecified as acute or chronic, without hemorrhage or perforation: Secondary | ICD-10-CM | POA: Diagnosis not present

## 2020-10-21 LAB — BASIC METABOLIC PANEL
Anion gap: 8 (ref 5–15)
BUN: 6 mg/dL — ABNORMAL LOW (ref 8–23)
CO2: 25 mmol/L (ref 22–32)
Calcium: 8.8 mg/dL — ABNORMAL LOW (ref 8.9–10.3)
Chloride: 109 mmol/L (ref 98–111)
Creatinine, Ser: 0.66 mg/dL (ref 0.44–1.00)
GFR, Estimated: >60 mL/min (ref >60)
Glucose, Bld: 89 mg/dL (ref 70–99)
Potassium: 3.6 mmol/L (ref 3.5–5.1)
Sodium: 142 mmol/L (ref 135–145)

## 2020-10-21 LAB — CBC
HCT: 30.1 % — ABNORMAL LOW (ref 36.0–46.0)
Hemoglobin: 9.4 g/dL — ABNORMAL LOW (ref 12.0–15.0)
MCH: 28 pg (ref 26.0–34.0)
MCHC: 31.2 g/dL (ref 30.0–36.0)
MCV: 89.6 fL (ref 80.0–100.0)
Platelets: 179 10*3/uL (ref 150–400)
RBC: 3.36 MIL/uL — ABNORMAL LOW (ref 3.87–5.11)
RDW: 16.4 % — ABNORMAL HIGH (ref 11.5–15.5)
WBC: 3.7 10*3/uL — ABNORMAL LOW (ref 4.0–10.5)
nRBC: 0 % (ref 0.0–0.2)

## 2020-10-21 LAB — TROPONIN I (HIGH SENSITIVITY)
Troponin I (High Sensitivity): 3 ng/L (ref <18)
Troponin I (High Sensitivity): 4 ng/L (ref <18)

## 2020-10-21 MED ORDER — RIVAROXABAN 20 MG PO TABS
20.0000 mg | ORAL_TABLET | Freq: Every day | ORAL | Status: DC
  Administered 2020-10-22 – 2020-10-23 (×2): 20 mg via ORAL
  Filled 2020-10-21 (×2): qty 1

## 2020-10-21 MED ORDER — RIVAROXABAN 20 MG PO TABS
20.0000 mg | ORAL_TABLET | Freq: Every day | ORAL | Status: DC
Start: 2020-10-21 — End: 2020-10-21
  Filled 2020-10-21: qty 1

## 2020-10-21 NOTE — Telephone Encounter (Signed)
Spoke with patient about r/s appt missed on 2/25 due to hospitalization.  Patient stated she is feeling better and expects to be discharged this week. Appointment made for 3/14. Patient confirmed. Mailing appt reminder.

## 2020-10-21 NOTE — Progress Notes (Signed)
PROGRESS NOTE  Maria Murphy RXV:[id number] DOB: [birth year] DOA: 10/18/2020 PCP: Ladell Pier, MD  Brief History: 65 year old female with history of hypertension, peptic ulcer disease with perforation, saddle pulmonary embolus on rivaroxaban, depression presenting with 10-day history of intermittent abdominal pain and nausea and vomiting. The patient states that she has had approximately 2-3 episodes of emesis daily with upper abdominal pain in the epigastric and left upper quadrant region. She denies any fevers, chills, chest pain, shortness breath, coughing, hemoptysis. She has not had any hematemesis. She has had loose stools with occasional melena. The patient has had 2 recent emergency department visits. 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 There was also more prominent main pancreatic duct.There is also an inflamed appearance of the distal stomach and proximal duodenum with adjacent fat stranding that was increased since 09/24/2020. There is mild inflamed appearance of the gallbladder and porta hepatis. The patient was discharged home in stable condition after fluids and conservative care in the emergency department with a prescription for oxycodone. The patient endorses compliance with her Protonix and Carafate.  The patient states that she has not taken any NSAIDs recently. She endorses compliance with her rivaroxaban. Notably, the patient has had a complicated history of peptic ulcer disease secondary to NSAID use. She had a prolonged hospitalization from 04/21/2020 to 05/17/2020 secondary to a perforated gastric ulcer. She underwent exploratory laparotomy with Phillip Heal patch placed on 04/22/2021. This was complicated by intra-abdominal abscess for which required IR aspiration. That hospitalization was also complicated by a pulmonary embolus for which the  patient was started on rivaroxaban. She was subsequently remitted to the hospital on 07/05/2020 to 07/12/2020 for intractable nausea and vomiting. Fortunately, the patient improved with conservative/nonoperative management. She underwent EGD on 07/10/2020 which showed a scar in the gastric antrum and prepyloric region. There was nonbleeding duodenal ulcers. Once again, the patient was readmitted from 09/23/2020 to2/4/22 for abdominal pain and intractable nausea and vomiting. Again, the patient improved with conservative care.  In the emergency department, the patient was afebrile hemodynamically stable with oxygen saturation 100% room air. Sodium was 137, potassium 3.9, serum creatinine 0.81. LFTs were unremarkable. Lipase was 31. WBC 6.6, hemoglobin 9.9, platelets 208,000. The patient was started on a Protonix drip. GI was consulted to assist with management.  Assessment/Plan: Intractable abdominal pain/vomiting/melena -Suspect this is secondary to her peptic ulcer disease -Continue Protonix dripthrough 10/20/20 -GI consultappreciated -10/18/20 EGD--nonbleeding gastric ulcers;large duodenal ulcer in the duodenal bulb, close to 35 mm in diameter;no presence of stigmata of recent bleeding but there was presence of a suture at the bed of the ulcer which was semicircumferential -Avoid NSAIDs -Holding rivaroxaban temporarily -10/15/2020 CT abdomen--as discussed above -10/18/20 AXR--mod to large stool burden with increase air distension of stomach to mid SB -10/19/20-discussed with GI, Dr. Abbey Chatters -10/21/20--no further n/v, advanced to full liquids -10/21/20--restart xarelto  Inflamed gallbladder appearance -09/18/2020 HIDA scan normal -10/18/20 RUQ US--Cholelithiasis and mild gallbladder wall thickening.+murphy's -Previously seen by surgery who did not feel that her clinical presentation was consistent with cholecystitis -appreciate general consult-->did not feel patient has  cholecysitis  Pancreatic head mass -This has been followed by surveillance by GI -EUS upcoming 11/20/20 by Dr. Bruce Donath -baseline Hgb 10-11 -Hgb nadir 8.8 -Hgb now stable  History of pulmonary embolus -Patient had submassive/saddle embolus on CTA on  05/01/2020 -Temporarily holding rivaroxaban -patient has been taking faithfully since beginning of Nov 2021 -plan 6 full months of tx-->end of April  Essential hypertension -Holding lisinopril HCTZ for now -Monitor BP -IV labetalol as neededSBP >180  Depression -Restart Celexa and trazodone once able to tolerate p.o. -Restart Remeron once able to tolerate p.o.      Status is: Inpatient  Remains inpatient appropriate because:IV treatments appropriate due to intensity of illness or inability to take PO   Dispo: The patient is from:Home Anticipated d/c is GY:FVCB Patient currently is not medically stable to d/c. Difficult to place patient No        Family Communication:sister updatedat bedside 2/26 Consultants:GI  Code Status: FULL  DVT Prophylaxis:Xarelto on hold   Procedures: As Listed in Progress Note Above  Antibiotics: None    Status is: Inpatient  Remains inpatient appropriate because:IV treatments appropriate due to intensity of illness or inability to take PO   Dispo: The patient is from:Home Anticipated d/c is SW:HQPR Patient currently is not medically stable to d/c. Difficult to place patient No      Subjective: Patient states n/v are better.  abd pain still present, but improving.  Denies f/c cp, sob, n/v/d, dysuria  Objective: Vitals:   10/20/20 1403 10/20/20 2008 10/20/20 2204 10/21/20 0650  BP: 126/65 133/73 132/83 124/65  Pulse: (!) 56 (!) 55 (!) 53 (!) 49  Resp: 18  18 16   Temp: 97.6 F (36.4 C)  (!) 97.5 F (36.4 C) (!) 97.5 F (36.4 C)   TempSrc: Oral     SpO2: 95% 98% 97% 96%  Weight:      Height:        Intake/Output Summary (Last 24 hours) at 10/21/2020 1347 Last data filed at 10/21/2020 0900 Gross per 24 hour  Intake 480 ml  Output -  Net 480 ml   Weight change:  Exam:   General:  Pt is alert, follows commands appropriately, not in acute distress  HEENT: No icterus, No thrush, No neck mass, Lucas/AT  Cardiovascular: RRR, S1/S2, no rubs, no gallops  Respiratory: CTA bilaterally, no wheezing, no crackles, no rhonchi  Abdomen: Soft/+BS, epigastric tender, non distended, no guarding  Extremities: No edema, No lymphangitis, No petechiae, No rashes, no synovitis   Data Reviewed: I have personally reviewed following labs and imaging studies Basic Metabolic Panel: Recent Labs  Lab 10/16/20 0439 10/18/20 0319 10/19/20 0452 10/20/20 0601 10/21/20 0640  NA 137 137 141 141 142  K 3.9 3.9 3.9 3.5 3.6  CL 106 107 111 111 109  CO2 22 22 22 25 25   GLUCOSE 116* 121* 91 86 89  BUN 21 16 6* 5* 6*  CREATININE 0.72 0.81 0.58 0.72 0.66  CALCIUM 9.3 8.8* 8.7* 8.5* 8.8*  MG  --  2.1  --  2.0  --   PHOS  --  3.7  --   --   --    Liver Function Tests: Recent Labs  Lab 10/15/20 0219 10/16/20 0439 10/18/20 0319 10/19/20 0452  AST 21 16 20 15   ALT 8 9 7 6   ALKPHOS 47 42 43 33*  BILITOT 0.6 0.4 0.6 0.4  PROT 7.9 7.1 6.6 5.3*  ALBUMIN 4.6 4.1 4.0 3.1*   Recent Labs  Lab 10/15/20 0219 10/16/20 0439 10/18/20 0319  LIPASE 41 30 31   No results for input(s): AMMONIA in the last 168 hours. Coagulation Profile: Recent Labs  Lab 10/18/20 0319 10/19/20 0452  INR 1.0 1.1  CBC: Recent Labs  Lab 10/16/20 0439 10/18/20 0319 10/19/20 0452 10/20/20 0601 10/21/20 0640  WBC 6.6 6.6 3.3* 3.1* 3.7*  NEUTROABS 5.0 4.4  --   --   --   HGB 11.1* 9.9* 8.8* 9.6* 9.4*  HCT 34.9* 31.4* 28.1* 30.3* 30.1*  MCV 86.2 87.5 89.2 88.1 89.6  PLT 199 208 167 173 179   Cardiac Enzymes: No results for input(s):  CKTOTAL, CKMB, CKMBINDEX, TROPONINI in the last 168 hours. BNP: Invalid input(s): POCBNP CBG: No results for input(s): GLUCAP in the last 168 hours. HbA1C: No results for input(s): HGBA1C in the last 72 hours. Urine analysis:    Component Value Date/Time   COLORURINE YELLOW 10/18/2020 0235   APPEARANCEUR CLEAR 10/18/2020 0235   LABSPEC 1.013 10/18/2020 0235   PHURINE 6.0 10/18/2020 0235   GLUCOSEU NEGATIVE 10/18/2020 0235   HGBUR NEGATIVE 10/18/2020 0235   BILIRUBINUR NEGATIVE 10/18/2020 0235   KETONESUR NEGATIVE 10/18/2020 0235   PROTEINUR NEGATIVE 10/18/2020 0235   NITRITE NEGATIVE 10/18/2020 0235   LEUKOCYTESUR SMALL (A) 10/18/2020 0235   Sepsis Labs: @LABRCNTIP (procalcitonin:4,lacticidven:4) ) Recent Results (from the past 240 hour(s))  Resp Panel by RT-PCR (Flu A&B, Covid) Nasopharyngeal Swab     Status: None   Collection Time: 10/18/20  3:52 AM   Specimen: Nasopharyngeal Swab; Nasopharyngeal(NP) swabs in vial transport medium  Result Value Ref Range Status   SARS Coronavirus 2 by RT PCR NEGATIVE NEGATIVE Final    Comment: (NOTE) SARS-CoV-2 target nucleic acids are NOT DETECTED.  The SARS-CoV-2 RNA is generally detectable in upper respiratory specimens during the acute phase of infection. The lowest concentration of SARS-CoV-2 viral copies this assay can detect is 138 copies/mL. A negative result does not preclude SARS-Cov-2 infection and should not be used as the sole basis for treatment or other patient management decisions. A negative result may occur with  improper specimen collection/handling, submission of specimen other than nasopharyngeal swab, presence of viral mutation(s) within the areas targeted by this assay, and inadequate number of viral copies(<138 copies/mL). A negative result must be combined with clinical observations, patient history, and epidemiological information. The expected result is Negative.  Fact Sheet for Patients:   EntrepreneurPulse.com.au  Fact Sheet for Healthcare Providers:  IncredibleEmployment.be  This test is no t yet approved or cleared by the [place] FDA and  has been authorized for detection and/or diagnosis of SARS-CoV-2 by FDA under an Emergency Use Authorization (EUA). This EUA will remain  in effect (meaning this test can be used) for the duration of the COVID-19 declaration under Section 564(b)(1) of the Act, 21 U.S.C.section 360bbb-3(b)(1), unless the authorization is terminated  or revoked sooner.       Influenza A by PCR NEGATIVE NEGATIVE Final   Influenza B by PCR NEGATIVE NEGATIVE Final    Comment: (NOTE) The Xpert Xpress SARS-CoV-2/FLU/RSV plus assay is intended as an aid in the diagnosis of influenza from Nasopharyngeal swab specimens and should not be used as a sole basis for treatment. Nasal washings and aspirates are unacceptable for Xpert Xpress SARS-CoV-2/FLU/RSV testing.  Fact Sheet for Patients: EntrepreneurPulse.com.au  Fact Sheet for Healthcare Providers: IncredibleEmployment.be  This test is not yet approved or cleared by the [place] FDA and has been authorized for detection and/or diagnosis of SARS-CoV-2 by FDA under an Emergency Use Authorization (EUA). This EUA will remain in effect (meaning this test can be used) for the duration of the COVID-19 declaration under Section 564(b)(1) of the Act, 21 U.S.C. section 360bbb-3(b)(1), unless  the authorization is terminated or revoked.  Performed at [facility], [address] [place], [place] [address]      Scheduled Meds: . citalopram  20 mg Oral Daily  . mirtazapine  15 mg Oral QHS  . pantoprazole (PROTONIX) IV  40 mg Intravenous Q12H  . sucralfate  1 g Oral TID WC & HS   Continuous Infusions:  Procedures/Studies: CT ABDOMEN PELVIS W CONTRAST  Result Date: 10/15/2020 CLINICAL DATA:  65 year old female with  abdominal pain for 6 days. History of exploratory laparotomy, perforated gastric ulcer in August 2021. EXAM: CT ABDOMEN AND PELVIS WITH CONTRAST TECHNIQUE: Multidetector CT imaging of the abdomen and pelvis was performed using the standard protocol following bolus administration of intravenous contrast. CONTRAST:  142mL OMNIPAQUE IOHEXOL 300 MG/ML  SOLN COMPARISON:  CT Abdomen and Pelvis 09/24/2020 and earlier, including MRI 04/22/2020. FINDINGS: Lower chest: Stable lung bases and cardiac size at the upper limits of normal. No pericardial or pleural effusion. Hepatobiliary: Chronic cholelithiasis with gallstones individually up to 19 mm. Fat at the porta hepatis and the gallbladder wall remain indistinct similar to earlier this month. No progressive gallbladder wall thickening. No intrahepatic biliary ductal dilatation. Stable liver enhancement including tiny low-density area near the dome on the right which has a benign appearance. No perihepatic free fluid. Pancreas: Multi cystic, partially calcified mass of the pancreatic head measuring up to 43 mm long axis (series 2, image 29) appears larger since August 2021 by up to 10 mm. Main pancreatic duct also appears more prominent since that time. But there is no convincing primary pancreatic inflammation. No free fluid in the lesser sac. Spleen: Negative. Adrenals/Urinary Tract: Normal adrenal glands. Both kidneys are stable since August including mildly complex partially calcified exophytic right upper pole cystic lesion - which showed no suspicious enhancement on August MRI. Superimposed benign appearing parapelvic and left renal midpole cysts. Normal renal contrast excretion on delayed images. No perinephric stranding. No definite nephrolithiasis. Decompressed ureters. Negative urinary bladder. Chronic pelvic phleboliths. Stomach/Bowel: Negative rectum. Extensive sigmoid diverticulosis with no active inflammation. Redundant transverse colon. Retained stool in the  transverse and right colon. Cecum is on a lax mesentery located in the right mid abdomen. Normal appendix visible on series 5, image 30. No large bowel inflammation. Negative terminal ileum. No dilated small bowel. Small gastric hiatal hernia. No free air. No free fluid identified. But there remains an indistinct appearance of the gastric antrum and proximal duodenum (series 2, image 24 and series 5, image 24) with increased mesenteric stranding from earlier this month. Regional mucosal hyperenhancement and wall thickening. No extraluminal gas identified. Vascular/Lymphatic: Major arterial structures in the abdomen and pelvis are patent and mildly tortuous. Mild Aortoiliac calcified atherosclerosis. Portal venous system is patent. No lymphadenopathy. Reproductive: Lobulated, fibroid uterus including multiple exophytic, subserosal fibroids anteriorly and at the fundus which are individually up to 37 mm diameter and not significantly changed since 04/20/2020. Adnexa appear stable and within normal limits. Other: No pelvic free fluid. Chronic postoperative changes to the ventral upper abdomen are stable. Musculoskeletal: Intermittent advanced disc and endplate degeneration in the visible spine. Grade 1 lumbar spondylolisthesis. No acute osseous abnormality identified. IMPRESSION: 1. 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 This was evaluated by MRI in August 2021 (please see that report), and appears mildly larger (4.3 cm) with increased conspicuity of the pancreatic duct since that time. 2. Continued inflamed appearance of the distal stomach and proximal duodenum compatible with Recurrent Acute Peptic Ulcer Disease. Adjacent  mesenteric fat stranding seems increased since 09/24/2020, although there is no evidence of perforation. 3. Persistent mildly inflamed appearance of the gallbladder and porta hepatis region also, with underlying Cholelithiasis. 4. Otherwise stable abdomen and pelvis,  including renal cysts which had a benign MRI appearance, sigmoid diverticulosis, fibroid uterus. Electronically Signed   By: Genevie Ann M.D.   On: 10/15/2020 04:42   CT Abdomen Pelvis W Contrast  Result Date: 09/24/2020 CLINICAL DATA:  Nausea, vomiting and diarrhea for 2 days EXAM: CT ABDOMEN AND PELVIS WITH CONTRAST TECHNIQUE: Multidetector CT imaging of the abdomen and pelvis was performed using the standard protocol following bolus administration of intravenous contrast. CONTRAST:  116mL OMNIPAQUE IOHEXOL 300 MG/ML  SOLN COMPARISON:  CT 08/07/2020 04/22/2020 FINDINGS: Lower chest: Atelectatic changes in the wrist clear lung bases. Cardiac size is top normal. No pericardial effusion. Few coronary artery calcifications. Hepatobiliary: Few subcentimeter hypoattenuating foci in the liver too small to fully characterize on CT imaging but statistically likely benign. No concerning focal liver lesions. Smooth liver surface contour. Normal liver attenuation. Gallbladder contains multiple partially calcified gallstones several of which appear partially pneumatized as well. There is some increasing pericholecystic inflammatory change when compared to prior imaging. No visible intraductal gallstones or biliary ductal dilatation is seen however. Pancreas: Redemonstration of the multilobulated cystic lesion in the head of the pancreas with few punctate calcifications again measuring up to 3.5 by 4.0 cm in maximum transaxial dimension, not significantly changed from comparison studies. Previously characterized on prior MR imaging 04/22/2020 with a differential including mucinous cystic neoplasm versus sequela of prior pancreatitis. Remaining portions of the pancreas are unremarkable without new or concerning pancreatic lesion, ductal dilatation, or peripancreatic inflammation. Spleen: Normal in size. No concerning splenic lesions. Adrenals/Urinary Tract: Normal adrenal glands. Kidneys are normal in size and position. Multiple  simple and parapelvic cysts are noted bilaterally. A slightly more complex, partially exophytic peripherally calcified cystic lesion measuring up to 1.8 cm is seen arising from the upper pole right kidney also previously characterized on prior MR imaging is a complex though likely benign cyst. No new or concerning renal lesions. No frank hydronephrosis or obstructive urolithiasis. There is some mild faint perivesicular hazy stranding and some questionable layering debris versus asymmetric thickening of the left posterolateral bladder wall. Stomach/Bowel: Small sliding-type hiatal hernia. Proximal stomach is unremarkable. There is a circumferentially thickened appearance of the duodenal fold and gastric antrum. Mild mucosal hyperemia and possible ulceration (5/23) is present as well. More distal duodenum is unremarkable. No other small bowel thickening or dilatation. A normal appendix is visualized. No colonic dilatation or wall thickening. Scattered colonic diverticula without focal inflammation to suggest diverticulitis. Relative lack of formed stool could reflect a rapid transit state/diarrheal illness. Vascular/Lymphatic: Atherosclerotic calcification in the iliac arteries. Other significant vascular findings. No suspicious or enlarged lymph nodes in the included lymphatic chains. Reproductive: Lobular, heterogeneously enhancing lesions with uterus including both intramural and partially exophytic foci keeping with a diagnosis of uterine leiomyomata. No concerning adnexal lesions. Other: No abdominopelvic free fluid or free gas. No bowel containing hernias. Prior vertical midline incision. Mild circumferential body wall edema most pronounced posteriorly. Musculoskeletal: Multilevel degenerative changes are present in the imaged portions of the spine. More sclerotic changes about a region of disc desiccation at the T11-12 level is similar to prior and favored to be on a degenerative basis. Grade 1 anterolisthesis  L4 on 5 without associated spondylolysis. 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 Additional  moderate degenerative changes in the hips and pelvis. No acute or conspicuous osseous lesions. IMPRESSION: 1. Circumferentially thickened appearance of the gastric antrum and duodenal bulb with some anemia mucosal hyperemia which could reflect some gastritis/duodenitis with a site of possible ulceration. No evidence of frank perforation or abscess. Correlate with endoscopy as clinically warranted. 2. Cholelithiasis with some slightly increasing pericholecystic inflammatory change when compared to prior imaging. If there is clinical concern for acute cholecystitis, recommend further evaluation with right upper quadrant ultrasound. 3. Relative lack of formed stool could reflect a rapid transit state/diarrheal illness. 4. Unchanged appearance of the multilobulated cystic lesion in the head of the pancreas with few punctate calcifications. Previously characterized on prior MR imaging 04/22/2020 with a differential including mucinous cystic neoplasm versus sequela of prior pancreatitis. 5. Minimally complex exophytic cystic lesion from the right kidney, previously characterized on MR imaging is a probable benign cyst. Additional fluid attenuation cysts in both kidneys. Acute urinary tract abnormality. 6. Fibroid uterus. 7. Aortic Atherosclerosis (ICD10-I70.0). Electronically Signed   By: Lovena Le M.D.   On: 09/24/2020 02:36   US Abdomen Limited  Result Date: 10/18/2020 CLINICAL DATA:  Abdominal pain.  History of gallbladder stones. EXAM: ULTRASOUND ABDOMEN LIMITED RIGHT UPPER QUADRANT COMPARISON:  CT of the abdomen and pelvis on 10/15/2020 and ultrasound of the abdomen 09/24/2020 FINDINGS: Gallbladder: Gallbladder wall is mildly thickened, 3.6 millimeters. There are numerous stones within the gallbladder, measuring up to 1.5 centimeters. No pericholecystic fluid. Note is made  of a positive sonographic Murphy's sign. Common bile duct: Diameter: 5.8 millimeters Liver: Focal areas of increased attenuation particularly within the gallbladder fossa, consistent with focal fatty infiltration. No focal liver lesions are identified. Portal vein is patent on color Doppler imaging with normal direction of blood flow towards the liver. Other: None. IMPRESSION: 1. Cholelithiasis and mild gallbladder wall thickening. 2. Sonographic Murphy's sign is present today, new since the previous ultrasound exam. Consider acute cholecystitis. 3. There is no pericholecystic fluid. 4. Focal fatty infiltration of the liver. These results will be called to the ordering clinician or representative by the Radiologist Assistant, and communication documented in the PACS or [employer]. Electronically Signed   By: Nolon Nations M.D.   On: 10/18/2020 10:22   DG Chest Port 1 View  Result Date: 09/24/2020 CLINICAL DATA:  Shortness of breath and cough EXAM: PORTABLE CHEST 1 VIEW COMPARISON:  05/02/2020 FINDINGS: Cardiac shadow is stable. Lungs are well aerated bilaterally. No focal infiltrate or sizable effusion is seen. No bony abnormality is noted. IMPRESSION: No active disease. Electronically Signed   By: Inez Catalina M.D.   On: 09/24/2020 01:25   DG Abdomen Acute W/Chest  Result Date: 10/18/2020 CLINICAL DATA:  Abdominal pain post surgery for perirectal abscess EXAM: DG ABDOMEN ACUTE WITH 1 VIEW CHEST COMPARISON:  Radiograph 10/16/2020, CT 10/15/2020 FINDINGS: Streaky basilar opacities and low volumes favoring atelectasis. No consolidation, features of edema, pneumothorax, or effusion. Pulmonary vascularity is normally distributed. The cardiomediastinal contours are unremarkable. No subdiaphragmatic free air. Redemonstrated calcification at the level of the pancreatic head corresponding to a lesion on comparison CT. There is increasing air distention of the stomach and mid abdominal small bowel with a  moderate to large colonic stool burden. Few calcified gallstones are less well visualized on this exam. No acute osseous abnormality. Degenerative changes in the shoulders, spine, hips and pelvis. The osseous structures appear diffusely demineralized which may limit detection of small or nondisplaced fractures. No worrisome findings of the remaining  soft tissues. IMPRESSION: 1. Moderate to large colonic stool burden with increasing air distention of the stomach and mid abdominal small bowel possibly related to constipation though could reflect an early or developing ileus in the appropriate clinical setting. No free air. 2. Calcification of the level of the pancreatic head corresponding to a pancreatic mass on comparison CT. 3. Lung volumes are low with streaky basilar opacities favoring atelectasis. Electronically Signed   By: Lovena Le M.D.   On: 10/18/2020 04:09   DG Abdomen Acute W/Chest  Result Date: 10/16/2020 CLINICAL DATA:  Abdominal pain, change EXAM: DG ABDOMEN ACUTE WITH 1 VIEW CHEST COMPARISON:  CT 10/15/2020, ultrasound 09/24/2020, radiograph 09/24/2020 FINDINGS: Some mildly coarsened interstitial changes are similar to prior with basilar atelectasis. No consolidation, features of edema, pneumothorax, or effusion. Unchanged cardiomediastinal contours. No subdiaphragmatic free air. No high-grade obstructive bowel gas pattern. Moderate colonic stool burden. Redemonstration of the calcification at the level of the pancreatic head associated with a cystic pancreatic neoplasm on comparison CT imaging. Few calcified gallstones are noted as well. Vascular calcium of the abdomen and pelvis. Few phleboliths in the deep pelvis. Extensive degenerative changes in the shoulders, spine, hips and pelvis. The osseous structures appear diffusely demineralized which may limit detection of small or nondisplaced fractures. No acute osseous abnormality or suspicious osseous lesion. IMPRESSION: 1. Stable chronic  interstitial changes in the lungs with basilar atelectasis. No acute cardiopulmonary abnormality. 2. Nonobstructive bowel gas pattern. Moderate colonic stool burden. 3. Calcification associated with the pancreatic lesion on comparison CT. Partly visualized gallstones as well. Electronically Signed   By: Lovena Le M.D.   On: 10/16/2020 05:26   US ABDOMEN LIMITED RUQ (LIVER/GB)  Result Date: 09/24/2020 CLINICAL DATA:  Abdominal pain.  History of gallstones. EXAM: ULTRASOUND ABDOMEN LIMITED RIGHT UPPER QUADRANT COMPARISON:  Ultrasound 07/28/2020.  CT 07/28/2020. FINDINGS: Gallbladder: Gallstones again noted. Largest measures 1.6 cm. Gallbladder wall is minimally prominent at 3 mm. Trace pericholecystic fluid. Negative Murphy sign. Common bile duct: Diameter: 6 mm Liver: Increased echogenicity consistent fatty infiltration or hepatocellular disease. Portal vein is patent on color Doppler imaging with normal direction of blood flow towards the liver. Other: None. IMPRESSION: 1. Gallstones again noted. Largest measures 1.6 cm. Gallbladder wall is minimally prominent at 3 mm. Trace pericholecystic fluid. Cholecystitis cannot be excluded. Negative Murphy sign. No biliary distention. 2. Increased echogenicity of the liver consistent fatty infiltration or hepatocellular disease. Electronically Signed   By: Marcello Moores  Register   On: 09/24/2020 09:15    Orson Eva, DO  [facility]  If 7PM-7AM, please contact night-coverage [email or web address] Password [email or web address] 10/21/2020, 1:47 PM   LOS: 3 days

## 2020-10-21 NOTE — Telephone Encounter (Signed)
Left message on machine to call back  

## 2020-10-21 NOTE — Progress Notes (Signed)
    Subjective: Pain improved. No N/V. Tolerating clear liquid diet.   Objective: Vital signs in last 24 hours: Temp:  [97.5 F (36.4 C)-97.9 F (36.6 C)] 97.5 F (36.4 C) (02/28 0650) Pulse Rate:  [49-56] 49 (02/28 0650) Resp:  [16-18] 16 (02/28 0650) BP: (124-134)/(65-83) 124/65 (02/28 0650) SpO2:  [95 %-98 %] 96 % (02/28 0650) Last BM Date: 10/19/20 General:   Alert and oriented, pleasant Head:  Normocephalic and atraumatic. Eyes:  No icterus, sclera clear. Conjuctiva pink.  Mouth:  Without lesions, mucosa pink and moist.  Abdomen:  Bowel sounds present, soft, TTP RUQ Neurologic:  Alert and  oriented x4  Intake/Output from previous day: No intake/output data recorded. Intake/Output this shift: No intake/output data recorded.  Lab Results: Recent Labs    10/19/20 0452 10/20/20 0601  WBC 3.3* 3.1*  HGB 8.8* 9.6*  HCT 28.1* 30.3*  PLT 167 173   BMET Recent Labs    10/19/20 0452 10/20/20 0601 10/21/20 0640  NA 141 141 142  K 3.9 3.5 3.6  CL 111 111 109  CO2 22 25 25   GLUCOSE 91 86 89  BUN 6* 5* 6*  CREATININE 0.58 0.72 0.66  CALCIUM 8.7* 8.5* 8.8*   LFT Recent Labs    10/19/20 0452  PROT 5.3*  ALBUMIN 3.1*  AST 15  ALT 6  ALKPHOS 33*  BILITOT 0.4   PT/INR Recent Labs    10/19/20 0452  LABPROT 13.8  INR 1.1   Assessment: 65 year old female with history of perforated peptic ulcer in August 2021 s/p Phillip Heal patch, hospitalized again in Nov 2021 with N/V and abdominal pain, undergoing EGD with healed gastric ulcer and two non-bleeding cratered duodenal ulcers in duodenal bulb (H.pylori serology negative), known cholelithiasis, enlarging pancreatic cystic mass concerning 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 She has had numerous admissions for the same. No surgical indication for cholecystectomy at this time.   Underwent EGD this admission with few gastric erosions, 1 superficial ulcer in pylorus with clean base,  large duodenal ulcer in duodenal bulb with presence of suture at bed of ulcer. Gastrin level 19. She has completed 72 hours of Protonix drip and now receiving IV PPI BID.   Clinically, she has improved with resolution of N/V and improvement in abdominal pain. Will advance diet to full liquids for lunch.   Anemia: normocytic. Repeat CBC pending for today. No overt bleeding.   Pancreatic mass: keep plans for EUS upcoming 11/20/20 by Dr. Rush Landmark.      Plan: As PPI infusion has completed, consider resuming Xarelto. Will discuss with GI attending.  PPI BID Carafate QID NSAID avoidance Follow CBC Advance to full liquids Keep plans for EUS upcoming   Annitta Needs, PhD, ANP-BC [facility]      LOS: 3 days    10/21/2020, 8:18 AM

## 2020-10-22 ENCOUNTER — Ambulatory Visit (HOSPITAL_COMMUNITY): Payer: Medicaid Other | Admitting: Physical Therapy

## 2020-10-22 DIAGNOSIS — K2971 Gastritis, unspecified, with bleeding: Secondary | ICD-10-CM | POA: Diagnosis not present

## 2020-10-22 DIAGNOSIS — I10 Essential (primary) hypertension: Secondary | ICD-10-CM | POA: Diagnosis not present

## 2020-10-22 DIAGNOSIS — D649 Anemia, unspecified: Secondary | ICD-10-CM

## 2020-10-22 DIAGNOSIS — K921 Melena: Secondary | ICD-10-CM | POA: Diagnosis not present

## 2020-10-22 DIAGNOSIS — K269 Duodenal ulcer, unspecified as acute or chronic, without hemorrhage or perforation: Secondary | ICD-10-CM | POA: Diagnosis not present

## 2020-10-22 LAB — CBC
HCT: 29.9 % — ABNORMAL LOW (ref 36.0–46.0)
Hemoglobin: 9.6 g/dL — ABNORMAL LOW (ref 12.0–15.0)
MCH: 28.1 pg (ref 26.0–34.0)
MCHC: 32.1 g/dL (ref 30.0–36.0)
MCV: 87.4 fL (ref 80.0–100.0)
Platelets: 174 10*3/uL (ref 150–400)
RBC: 3.42 MIL/uL — ABNORMAL LOW (ref 3.87–5.11)
RDW: 15.8 % — ABNORMAL HIGH (ref 11.5–15.5)
WBC: 3.4 10*3/uL — ABNORMAL LOW (ref 4.0–10.5)
nRBC: 0 % (ref 0.0–0.2)

## 2020-10-22 LAB — BASIC METABOLIC PANEL
Anion gap: 8 (ref 5–15)
BUN: 6 mg/dL — ABNORMAL LOW (ref 8–23)
CO2: 25 mmol/L (ref 22–32)
Calcium: 8.6 mg/dL — ABNORMAL LOW (ref 8.9–10.3)
Chloride: 108 mmol/L (ref 98–111)
Creatinine, Ser: 0.75 mg/dL (ref 0.44–1.00)
GFR, Estimated: >60 mL/min (ref >60)
Glucose, Bld: 87 mg/dL (ref 70–99)
Potassium: 3.6 mmol/L (ref 3.5–5.1)
Sodium: 141 mmol/L (ref 135–145)

## 2020-10-22 NOTE — Telephone Encounter (Signed)
EUS scheduled, pt instructed and medications reviewed.  Patient instructions mailed to home.  Patient to call with any questions or concerns.   Letter sent to Dr Wynetta Emery regarding xarelto.  All information mailed and sent via My Chart.

## 2020-10-22 NOTE — Progress Notes (Signed)
Subjective: Tolerated her full liquid diet well.  No nausea or vomiting since Sunday.  Abdominal pain continues to improve.  Currently with mild 4/10 in severity epigastric abdominal pain.  No BRBPR or melena.  She has not had a BM since Saturday and noted it was still dark at that time.  Continues to pass gas.  Xarelto was restarted today.  Objective: Vital signs in last 24 hours: Temp:  [97.8 F (36.6 C)-98 F (36.7 C)] 98 F (36.7 C) (03/01 0457) Pulse Rate:  [49-57] 57 (03/01 0457) Resp:  [17-18] 18 (03/01 0457) BP: (124-151)/(73-77) 124/75 (03/01 0457) SpO2:  [95 %-96 %] 95 % (03/01 0457) Last BM Date: 10/19/20 General:   Alert and oriented, pleasant Head:  Normocephalic and atraumatic. Abdomen:  Bowel sounds present, soft, non-distended.  Mild TTP in epigastric area.  Mild mild tenderness to palpation in the RUQ.  No HSM or hernias noted. No rebound or guarding. No masses appreciated  Msk:  Symmetrical without gross deformities. Normal posture. Extremities:  Without edema. Neurologic:  Alert and  oriented x4;  grossly normal neurologically. Psych: Normal mood and affect.  Intake/Output from previous day: 02/28 0701 - 03/01 0700 In: 1630 [P.O.:1630] Out: -  Intake/Output this shift: Total I/O In: 240 [P.O.:240] Out: -   Lab Results: Recent Labs    10/20/20 0601 10/21/20 0640 10/22/20 0507  WBC 3.1* 3.7* 3.4*  HGB 9.6* 9.4* 9.6*  HCT 30.3* 30.1* 29.9*  PLT 173 179 174   BMET Recent Labs    10/20/20 0601 10/21/20 0640 10/22/20 0507  NA 141 142 141  K 3.5 3.6 3.6  CL 111 109 108  CO2 25 25 25   GLUCOSE 86 89 87  BUN 5* 6* 6*  CREATININE 0.72 0.66 0.75  CALCIUM 8.5* 8.8* 8.6*   Assessment: 65 year old female with history of perforated peptic ulcer in August 2021 s/p Phillip Heal patch, hospitalized again in Nov 2021 with N/V and abdominal pain, undergoing EGD with healed gastric ulcer and two non-bleeding cratered duodenal ulcers in duodenal bulb (H.pylori  serology negative), known cholelithiasis, enlarging pancreatic cystic mass concerning 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 She has had numerous admissions for the same. No surgical indication for cholecystectomy at this time.   Underwent EGD 2/25 with few gastric erosions, 1 superficial ulcer in pylorus with clean base, large duodenal ulcer in duodenal bulb with presence of suture at bed of ulcer. Gastrin level 19. She has completed 72 hours of Protonix drip and now receiving IV PPI BID along with Carafate 4 times daily.   Clinically, she has improved. Nausea/vomiting resolved.  Continues with mild epigastric abdominal pain, but overall much improved compared to admission.  She does continue to require some pain medications.  We will plan to advance to soft diet today.  Anemia: Decline in hemoglobin noted on admission with hemoglobin 9.9, down from 11.1 two days prior.  This was in the setting melena and persistent PUD noted on EGD. Hemoglobin drifted down to 8.8 on 2/26, but today hemoglobin is 9.6.  No melena since Saturday. No brbpr. Likely secondary to PUD, notably, patient has never had colonoscopy which should be pursued as outpatient when able.  Xarelto had been on hold since admission and was restarted this morning. Will continue to monitor.   Pancreatic mass: keep plans for EUS upcoming 11/20/20 by Dr. Rush Landmark.    Plan: Advance to soft diet for lunch.  Monitor for overt GI bleeding as Xarelto was  resumed this morning. Monitor H/H. Continue PPI twice daily. Continue Carafate 4 times daily. Avoid all NSAIDs. Keep plans for EUS on 3/30 with Dr. Rush Landmark.  Anticipate possible discharge in the next 24 hours.   LOS: 4 days    10/22/2020, 10:56 AM   Aliene Altes, PA-C [facility]

## 2020-10-22 NOTE — Progress Notes (Signed)
PROGRESS NOTE  Maria Murphy EPP:[id number] DOB: December 18, 1955 DOA: 10/18/2020 PCP: Ladell Pier, MD  Brief History: 65 year old female with history of hypertension, peptic ulcer disease with perforation, saddle pulmonary embolus on rivaroxaban, depression presenting with 10-day history of intermittent abdominal pain and nausea and vomiting. The patient states that she has had approximately 2-3 episodes of emesis daily with upper abdominal pain in the epigastric and left upper quadrant region. She denies any fevers, chills, chest pain, shortness breath, coughing, hemoptysis. She has not had any hematemesis. She has had loose stools with occasional melena. The patient has had 2 recent emergency department visits. 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 There was also more prominent main pancreatic duct.There is also an inflamed appearance of the distal stomach and proximal duodenum with adjacent fat stranding that was increased since 09/24/2020. There is mild inflamed appearance of the gallbladder and porta hepatis. The patient was discharged home in stable condition after fluids and conservative care in the emergency department with a prescription for oxycodone. The patient endorses compliance with her Protonix and Carafate.  The patient states that she has not taken any NSAIDs recently. She endorses compliance with her rivaroxaban. Notably, the patient has had a complicated history of peptic ulcer disease secondary to NSAID use. She had a prolonged hospitalization from 04/21/2020 to 05/17/2020 secondary to a perforated gastric ulcer. She underwent exploratory laparotomy with Phillip Heal patch placed on 04/22/2021. This was complicated by intra-abdominal abscess for which required IR aspiration. That hospitalization was also complicated by a pulmonary embolus for which the  patient was started on rivaroxaban. She was subsequently remitted to the hospital on 07/05/2020 to 07/12/2020 for intractable nausea and vomiting. Fortunately, the patient improved with conservative/nonoperative management. She underwent EGD on 07/10/2020 which showed a scar in the gastric antrum and prepyloric region. There was nonbleeding duodenal ulcers. Once again, the patient was readmitted from 09/23/2020 to2/4/22 for abdominal pain and intractable nausea and vomiting. Again, the patient improved with conservative care.  In the emergency department, the patient was afebrile hemodynamically stable with oxygen saturation 100% room air. Sodium was 137, potassium 3.9, serum creatinine 0.81. LFTs were unremarkable. Lipase was 31. WBC 6.6, hemoglobin 9.9, platelets 208,000. The patient was started on a Protonix drip. GI was consulted to assist with management.  Assessment/Plan: Intractable abdominal pain/vomiting/melena -Suspect this is secondary to her peptic ulcer disease -Continue Protonix dripthrough 10/20/20 -GI consultappreciated -10/18/20 EGD--nonbleeding gastric ulcers;large duodenal ulcer in the duodenal bulb, close to 35 mm in diameter;no presence of stigmata of recent bleeding but there was presence of a suture at the bed of the ulcer which was semicircumferential -Avoid NSAIDs -Holding rivaroxaban temporarily -10/15/2020 CT abdomen--as discussed above -10/18/20 AXR--mod to large stool burden with increase air distension of stomach to mid SB -10/19/20-discussed with GI, Dr. Abbey Chatters -10/21/20--no further n/v, advanced to full liquids -10/22/20--restart xarelto  Inflamed gallbladder appearance -09/18/2020 HIDA scan normal -10/18/20 RUQ US--Cholelithiasis and mild gallbladder wall thickening.+murphy's -Previously seen by surgery who did not feel that her clinical presentation was consistent with cholecystitis -appreciate general consult-->did not feel patient has  cholecysitis  Pancreatic head mass -This has been followed by surveillance by GI -EUS upcoming 11/20/20 by Dr. Bruce Donath -baseline Hgb 10-11 -Hgb nadir 8.8 -Hgb now stable  History of pulmonary embolus -Patient had submassive/saddle embolus on CTA on  05/01/2020 -Temporarily holding rivaroxaban -patient has been taking faithfully since beginning of Nov 2021 -plan 6 full months of tx-->end of April  Essential hypertension -Holding lisinopril HCTZ for now -Monitor BP -IV labetalol as neededSBP >180  Depression -Restart Celexa and trazodone once able to tolerate p.o. -Restart Remeron once able to tolerate p.o.  Atypical Chest pain -due to GI effects -personally reviewed EKG--sinus, no STT changes -Troponins neg    Status is: Inpatient  Remains inpatient appropriate because:IV treatments appropriate due to intensity of illness or inability to take PO   Dispo: The patient is from:Home Anticipated d/c is FG:HWEX Patient currently is not medically stable to d/c. Difficult to place patient No        Family Communication:sister updatedat bedside 2/27 Consultants:GI  Code Status: FULL  DVT Prophylaxis:Xarelto restarted 3/1   Procedures: As Listed in Progress Note Above  Antibiotics: None    Status is: Inpatient  Remains inpatient appropriate because:IV treatments appropriate due to intensity of illness or inability to take PO   Dispo: The patient is from:Home Anticipated d/c is HB:ZJIR Patient currently is not medically stable to d/c. Difficult to place patient No        Subjective: Patient denies fevers, chills, headache, chest pain, dyspnea, nausea, vomiting, diarrhea, abdominal pain, dysuria, hematuria, hematochezia, and melena.   Objective: Vitals:   10/21/20 1300 10/21/20 2056 10/22/20 0457 10/22/20 1414   BP: 128/73 (!) 151/77 124/75 124/77  Pulse: (!) 54 (!) 49 (!) 57 (!) 55  Resp: 17 18 18    Temp: 97.8 F (36.6 C) 97.8 F (36.6 C) 98 F (36.7 C) 98.1 F (36.7 C)  TempSrc: Oral   Oral  SpO2: 96% 96% 95% 96%  Weight:      Height:        Intake/Output Summary (Last 24 hours) at 10/22/2020 1905 Last data filed at 10/22/2020 1300 Gross per 24 hour  Intake 480 ml  Output --  Net 480 ml   Weight change:  Exam:   General:  Pt is alert, follows commands appropriately, not in acute distress  HEENT: No icterus, No thrush, No neck mass, [place]/AT  Cardiovascular: RRR, S1/S2, no rubs, no gallops  Respiratory: CTA bilaterally, no wheezing, no crackles, no rhonchi  Abdomen: Soft/+BS, non tender, non distended, no guarding  Extremities: No edema, No lymphangitis, No petechiae, No rashes, no synovitis   Data Reviewed: I have personally reviewed following labs and imaging studies Basic Metabolic Panel: Recent Labs  Lab 10/18/20 0319 10/19/20 0452 10/20/20 0601 10/21/20 0640 10/22/20 0507  NA 137 141 141 142 141  K 3.9 3.9 3.5 3.6 3.6  CL 107 111 111 109 108  CO2 22 22 25 25 25   GLUCOSE 121* 91 86 89 87  BUN 16 6* 5* 6* 6*  CREATININE 0.81 0.58 0.72 0.66 0.75  CALCIUM 8.8* 8.7* 8.5* 8.8* 8.6*  MG 2.1  --  2.0  --   --   PHOS 3.7  --   --   --   --    Liver Function Tests: Recent Labs  Lab 10/16/20 0439 10/18/20 0319 10/19/20 0452  AST 16 20 15   ALT 9 7 6   ALKPHOS 42 43 33*  BILITOT 0.4 0.6 0.4  PROT 7.1 6.6 5.3*  ALBUMIN 4.1 4.0 3.1*   Recent Labs  Lab 10/16/20 0439 10/18/20 0319  LIPASE 30 31   No results for input(s): AMMONIA in the last 168 hours. Coagulation Profile: Recent Labs  Lab 10/18/20 0319 10/19/20  2992  INR 1.0 1.1   CBC: Recent Labs  Lab 10/16/20 0439 10/18/20 0319 10/19/20 0452 10/20/20 0601 10/21/20 0640 10/22/20 0507  WBC 6.6 6.6 3.3* 3.1* 3.7* 3.4*  NEUTROABS 5.0 4.4  --   --   --   --   HGB 11.1* 9.9* 8.8* 9.6* 9.4* 9.6*   HCT 34.9* 31.4* 28.1* 30.3* 30.1* 29.9*  MCV 86.2 87.5 89.2 88.1 89.6 87.4  PLT 199 208 167 173 179 174   Cardiac Enzymes: No results for input(s): CKTOTAL, CKMB, CKMBINDEX, TROPONINI in the last 168 hours. BNP: Invalid input(s): POCBNP CBG: No results for input(s): GLUCAP in the last 168 hours. HbA1C: No results for input(s): HGBA1C in the last 72 hours. Urine analysis:    Component Value Date/Time   COLORURINE YELLOW 10/18/2020 [place] 10/18/2020 0235   LABSPEC 1.013 10/18/2020 0235   PHURINE 6.0 10/18/2020 0235   GLUCOSEU NEGATIVE 10/18/2020 0235   HGBUR NEGATIVE 10/18/2020 0235   BILIRUBINUR NEGATIVE 10/18/2020 0235   KETONESUR NEGATIVE 10/18/2020 0235   PROTEINUR NEGATIVE 10/18/2020 0235   NITRITE NEGATIVE 10/18/2020 0235   LEUKOCYTESUR SMALL (A) 10/18/2020 0235   Sepsis Labs: @LABRCNTIP (procalcitonin:4,lacticidven:4) ) Recent Results (from the past 240 hour(s))  Resp Panel by RT-PCR (Flu A&B, Covid) Nasopharyngeal Swab     Status: None   Collection Time: 10/18/20  3:52 AM   Specimen: Nasopharyngeal Swab; Nasopharyngeal(NP) swabs in vial transport medium  Result Value Ref Range Status   SARS Coronavirus 2 by RT PCR NEGATIVE NEGATIVE Final    Comment: (NOTE) SARS-CoV-2 target nucleic acids are NOT DETECTED.  The SARS-CoV-2 RNA is generally detectable in upper respiratory specimens during the acute phase of infection. The lowest concentration of SARS-CoV-2 viral copies this assay can detect is 138 copies/mL. A negative result does not preclude SARS-Cov-2 infection and should not be used as the sole basis for treatment or other patient management decisions. A negative result may occur with  improper specimen collection/handling, submission of specimen other than nasopharyngeal swab, presence of viral mutation(s) within the areas targeted by this assay, and inadequate number of viral copies(<138 copies/mL). A negative result must be combined  with clinical observations, patient history, and epidemiological information. The expected result is Negative.  Fact Sheet for Patients:  EntrepreneurPulse.com.au  Fact Sheet for Healthcare Providers:  IncredibleEmployment.be  This test is no t yet approved or cleared by the [place] FDA and  has been authorized for detection and/or diagnosis of SARS-CoV-2 by FDA under an Emergency Use Authorization (EUA). This EUA will remain  in effect (meaning this test can be used) for the duration of the COVID-19 declaration under Section 564(b)(1) of the Act, 21 U.S.C.section 360bbb-3(b)(1), unless the authorization is terminated  or revoked sooner.       Influenza A by PCR NEGATIVE NEGATIVE Final   Influenza B by PCR NEGATIVE NEGATIVE Final    Comment: (NOTE) The Xpert Xpress SARS-CoV-2/FLU/RSV plus assay is intended as an aid in the diagnosis of influenza from Nasopharyngeal swab specimens and should not be used as a sole basis for treatment. Nasal washings and aspirates are unacceptable for Xpert Xpress SARS-CoV-2/FLU/RSV testing.  Fact Sheet for Patients: EntrepreneurPulse.com.au  Fact Sheet for Healthcare Providers: IncredibleEmployment.be  This test is not yet approved or cleared by the [place] FDA and has been authorized for detection and/or diagnosis of SARS-CoV-2 by FDA under an Emergency Use Authorization (EUA). This EUA will remain in effect (meaning this test can be used) for  the duration of the COVID-19 declaration under Section 564(b)(1) of the Act, 21 U.S.C. section 360bbb-3(b)(1), unless the authorization is terminated or revoked.  Performed at [facility], [address] [place], [place] [address]      Scheduled Meds: . citalopram  20 mg Oral Daily  . mirtazapine  15 mg Oral QHS  . pantoprazole (PROTONIX) IV  40 mg Intravenous Q12H  . rivaroxaban  20 mg Oral Daily  . sucralfate   1 g Oral TID WC & HS   Continuous Infusions:  Procedures/Studies: CT ABDOMEN PELVIS W CONTRAST  Result Date: 10/15/2020 CLINICAL DATA:  65 year old female with abdominal pain for 6 days. History of exploratory laparotomy, perforated gastric ulcer in August 2021. EXAM: CT ABDOMEN AND PELVIS WITH CONTRAST TECHNIQUE: Multidetector CT imaging of the abdomen and pelvis was performed using the standard protocol following bolus administration of intravenous contrast. CONTRAST:  169mL OMNIPAQUE IOHEXOL 300 MG/ML  SOLN COMPARISON:  CT Abdomen and Pelvis 09/24/2020 and earlier, including MRI 04/22/2020. FINDINGS: Lower chest: Stable lung bases and cardiac size at the upper limits of normal. No pericardial or pleural effusion. Hepatobiliary: Chronic cholelithiasis with gallstones individually up to 19 mm. Fat at the porta hepatis and the gallbladder wall remain indistinct similar to earlier this month. No progressive gallbladder wall thickening. No intrahepatic biliary ductal dilatation. Stable liver enhancement including tiny low-density area near the dome on the right which has a benign appearance. No perihepatic free fluid. Pancreas: Multi cystic, partially calcified mass of the pancreatic head measuring up to 43 mm long axis (series 2, image 29) appears larger since August 2021 by up to 10 mm. Main pancreatic duct also appears more prominent since that time. But there is no convincing primary pancreatic inflammation. No free fluid in the lesser sac. Spleen: Negative. Adrenals/Urinary Tract: Normal adrenal glands. Both kidneys are stable since August including mildly complex partially calcified exophytic right upper pole cystic lesion - which showed no suspicious enhancement on August MRI. Superimposed benign appearing parapelvic and left renal midpole cysts. Normal renal contrast excretion on delayed images. No perinephric stranding. No definite nephrolithiasis. Decompressed ureters. Negative urinary bladder.  Chronic pelvic phleboliths. Stomach/Bowel: Negative rectum. Extensive sigmoid diverticulosis with no active inflammation. Redundant transverse colon. Retained stool in the transverse and right colon. Cecum is on a lax mesentery located in the right mid abdomen. Normal appendix visible on series 5, image 30. No large bowel inflammation. Negative terminal ileum. No dilated small bowel. Small gastric hiatal hernia. No free air. No free fluid identified. But there remains an indistinct appearance of the gastric antrum and proximal duodenum (series 2, image 24 and series 5, image 24) with increased mesenteric stranding from earlier this month. Regional mucosal hyperenhancement and wall thickening. No extraluminal gas identified. Vascular/Lymphatic: Major arterial structures in the abdomen and pelvis are patent and mildly tortuous. Mild Aortoiliac calcified atherosclerosis. Portal venous system is patent. No lymphadenopathy. Reproductive: Lobulated, fibroid uterus including multiple exophytic, subserosal fibroids anteriorly and at the fundus which are individually up to 37 mm diameter and not significantly changed since 04/20/2020. Adnexa appear stable and within normal limits. Other: No pelvic free fluid. Chronic postoperative changes to the ventral upper abdomen are stable. Musculoskeletal: Intermittent advanced disc and endplate degeneration in the visible spine. Grade 1 lumbar spondylolisthesis. No acute osseous abnormality identified. IMPRESSION: 1. 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 This was evaluated by MRI in August 2021 (please see that report), and appears mildly larger (4.3 cm) with increased conspicuity of  the pancreatic duct since that time. 2. Continued inflamed appearance of the distal stomach and proximal duodenum compatible with Recurrent Acute Peptic Ulcer Disease. Adjacent mesenteric fat stranding seems increased since 09/24/2020, although there is no evidence of  perforation. 3. Persistent mildly inflamed appearance of the gallbladder and porta hepatis region also, with underlying Cholelithiasis. 4. Otherwise stable abdomen and pelvis, including renal cysts which had a benign MRI appearance, sigmoid diverticulosis, fibroid uterus. Electronically Signed   By: Genevie Ann M.D.   On: 10/15/2020 04:42   CT Abdomen Pelvis W Contrast  Result Date: 09/24/2020 CLINICAL DATA:  Nausea, vomiting and diarrhea for 2 days EXAM: CT ABDOMEN AND PELVIS WITH CONTRAST TECHNIQUE: Multidetector CT imaging of the abdomen and pelvis was performed using the standard protocol following bolus administration of intravenous contrast. CONTRAST:  152mL OMNIPAQUE IOHEXOL 300 MG/ML  SOLN COMPARISON:  CT 08/07/2020 04/22/2020 FINDINGS: Lower chest: Atelectatic changes in the wrist clear lung bases. Cardiac size is top normal. No pericardial effusion. Few coronary artery calcifications. Hepatobiliary: Few subcentimeter hypoattenuating foci in the liver too small to fully characterize on CT imaging but statistically likely benign. No concerning focal liver lesions. Smooth liver surface contour. Normal liver attenuation. Gallbladder contains multiple partially calcified gallstones several of which appear partially pneumatized as well. There is some increasing pericholecystic inflammatory change when compared to prior imaging. No visible intraductal gallstones or biliary ductal dilatation is seen however. Pancreas: Redemonstration of the multilobulated cystic lesion in the head of the pancreas with few punctate calcifications again measuring up to 3.5 by 4.0 cm in maximum transaxial dimension, not significantly changed from comparison studies. Previously characterized on prior MR imaging 04/22/2020 with a differential including mucinous cystic neoplasm versus sequela of prior pancreatitis. Remaining portions of the pancreas are unremarkable without new or concerning pancreatic lesion, ductal dilatation, or  peripancreatic inflammation. Spleen: Normal in size. No concerning splenic lesions. Adrenals/Urinary Tract: Normal adrenal glands. Kidneys are normal in size and position. Multiple simple and parapelvic cysts are noted bilaterally. A slightly more complex, partially exophytic peripherally calcified cystic lesion measuring up to 1.8 cm is seen arising from the upper pole right kidney also previously characterized on prior MR imaging is a complex though likely benign cyst. No new or concerning renal lesions. No frank hydronephrosis or obstructive urolithiasis. There is some mild faint perivesicular hazy stranding and some questionable layering debris versus asymmetric thickening of the left posterolateral bladder wall. Stomach/Bowel: Small sliding-type hiatal hernia. Proximal stomach is unremarkable. There is a circumferentially thickened appearance of the duodenal fold and gastric antrum. Mild mucosal hyperemia and possible ulceration (5/23) is present as well. More distal duodenum is unremarkable. No other small bowel thickening or dilatation. A normal appendix is visualized. No colonic dilatation or wall thickening. Scattered colonic diverticula without focal inflammation to suggest diverticulitis. Relative lack of formed stool could reflect a rapid transit state/diarrheal illness. Vascular/Lymphatic: Atherosclerotic calcification in the iliac arteries. Other significant vascular findings. No suspicious or enlarged lymph nodes in the included lymphatic chains. Reproductive: Lobular, heterogeneously enhancing lesions with uterus including both intramural and partially exophytic foci keeping with a diagnosis of uterine leiomyomata. No concerning adnexal lesions. Other: No abdominopelvic free fluid or free gas. No bowel containing hernias. Prior vertical midline incision. Mild circumferential body wall edema most pronounced posteriorly. Musculoskeletal: Multilevel degenerative changes are present in the imaged  portions of the spine. More sclerotic changes about a region of disc desiccation at the T11-12 level is similar to prior and favored to be  on a degenerative basis. Grade 1 anterolisthesis L4 on 5 without associated spondylolysis. 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 Additional moderate degenerative changes in the hips and pelvis. No acute or conspicuous osseous lesions. IMPRESSION: 1. Circumferentially thickened appearance of the gastric antrum and duodenal bulb with some anemia mucosal hyperemia which could reflect some gastritis/duodenitis with a site of possible ulceration. No evidence of frank perforation or abscess. Correlate with endoscopy as clinically warranted. 2. Cholelithiasis with some slightly increasing pericholecystic inflammatory change when compared to prior imaging. If there is clinical concern for acute cholecystitis, recommend further evaluation with right upper quadrant ultrasound. 3. Relative lack of formed stool could reflect a rapid transit state/diarrheal illness. 4. Unchanged appearance of the multilobulated cystic lesion in the head of the pancreas with few punctate calcifications. Previously characterized on prior MR imaging 04/22/2020 with a differential including mucinous cystic neoplasm versus sequela of prior pancreatitis. 5. Minimally complex exophytic cystic lesion from the right kidney, previously characterized on MR imaging is a probable benign cyst. Additional fluid attenuation cysts in both kidneys. Acute urinary tract abnormality. 6. Fibroid uterus. 7. Aortic Atherosclerosis (ICD10-I70.0). Electronically Signed   By: Lovena Le M.D.   On: 09/24/2020 02:36   US Abdomen Limited  Result Date: 10/18/2020 CLINICAL DATA:  Abdominal pain.  History of gallbladder stones. EXAM: ULTRASOUND ABDOMEN LIMITED RIGHT UPPER QUADRANT COMPARISON:  CT of the abdomen and pelvis on 10/15/2020 and ultrasound of the abdomen 09/24/2020 FINDINGS:  Gallbladder: Gallbladder wall is mildly thickened, 3.6 millimeters. There are numerous stones within the gallbladder, measuring up to 1.5 centimeters. No pericholecystic fluid. Note is made of a positive sonographic Murphy's sign. Common bile duct: Diameter: 5.8 millimeters Liver: Focal areas of increased attenuation particularly within the gallbladder fossa, consistent with focal fatty infiltration. No focal liver lesions are identified. Portal vein is patent on color Doppler imaging with normal direction of blood flow towards the liver. Other: None. IMPRESSION: 1. Cholelithiasis and mild gallbladder wall thickening. 2. Sonographic Murphy's sign is present today, new since the previous ultrasound exam. Consider acute cholecystitis. 3. There is no pericholecystic fluid. 4. Focal fatty infiltration of the liver. These results will be called to the ordering clinician or representative by the Radiologist Assistant, and communication documented in the PACS or [employer]. Electronically Signed   By: Nolon Nations M.D.   On: 10/18/2020 10:22   DG Chest Port 1 View  Result Date: 09/24/2020 CLINICAL DATA:  Shortness of breath and cough EXAM: PORTABLE CHEST 1 VIEW COMPARISON:  05/02/2020 FINDINGS: Cardiac shadow is stable. Lungs are well aerated bilaterally. No focal infiltrate or sizable effusion is seen. No bony abnormality is noted. IMPRESSION: No active disease. Electronically Signed   By: Inez Catalina M.D.   On: 09/24/2020 01:25   DG Abdomen Acute W/Chest  Result Date: 10/18/2020 CLINICAL DATA:  Abdominal pain post surgery for perirectal abscess EXAM: DG ABDOMEN ACUTE WITH 1 VIEW CHEST COMPARISON:  Radiograph 10/16/2020, CT 10/15/2020 FINDINGS: Streaky basilar opacities and low volumes favoring atelectasis. No consolidation, features of edema, pneumothorax, or effusion. Pulmonary vascularity is normally distributed. The cardiomediastinal contours are unremarkable. No subdiaphragmatic free air.  Redemonstrated calcification at the level of the pancreatic head corresponding to a lesion on comparison CT. There is increasing air distention of the stomach and mid abdominal small bowel with a moderate to large colonic stool burden. Few calcified gallstones are less well visualized on this exam. No acute osseous abnormality. Degenerative changes in the shoulders,  spine, hips and pelvis. The osseous structures appear diffusely demineralized which may limit detection of small or nondisplaced fractures. No worrisome findings of the remaining soft tissues. IMPRESSION: 1. Moderate to large colonic stool burden with increasing air distention of the stomach and mid abdominal small bowel possibly related to constipation though could reflect an early or developing ileus in the appropriate clinical setting. No free air. 2. Calcification of the level of the pancreatic head corresponding to a pancreatic mass on comparison CT. 3. Lung volumes are low with streaky basilar opacities favoring atelectasis. Electronically Signed   By: Lovena Le M.D.   On: 10/18/2020 04:09   DG Abdomen Acute W/Chest  Result Date: 10/16/2020 CLINICAL DATA:  Abdominal pain, change EXAM: DG ABDOMEN ACUTE WITH 1 VIEW CHEST COMPARISON:  CT 10/15/2020, ultrasound 09/24/2020, radiograph 09/24/2020 FINDINGS: Some mildly coarsened interstitial changes are similar to prior with basilar atelectasis. No consolidation, features of edema, pneumothorax, or effusion. Unchanged cardiomediastinal contours. No subdiaphragmatic free air. No high-grade obstructive bowel gas pattern. Moderate colonic stool burden. Redemonstration of the calcification at the level of the pancreatic head associated with a cystic pancreatic neoplasm on comparison CT imaging. Few calcified gallstones are noted as well. Vascular calcium of the abdomen and pelvis. Few phleboliths in the deep pelvis. Extensive degenerative changes in the shoulders, spine, hips and pelvis. The osseous  structures appear diffusely demineralized which may limit detection of small or nondisplaced fractures. No acute osseous abnormality or suspicious osseous lesion. IMPRESSION: 1. 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 No acute cardiopulmonary abnormality. 2. Nonobstructive bowel gas pattern. Moderate colonic stool burden. 3. Calcification associated with the pancreatic lesion on comparison CT. Partly visualized gallstones as well. Electronically Signed   By: Lovena Le M.D.   On: 10/16/2020 05:26   US ABDOMEN LIMITED RUQ (LIVER/GB)  Result Date: 09/24/2020 CLINICAL DATA:  Abdominal pain.  History of gallstones. EXAM: ULTRASOUND ABDOMEN LIMITED RIGHT UPPER QUADRANT COMPARISON:  Ultrasound 07/28/2020.  CT 07/28/2020. FINDINGS: Gallbladder: Gallstones again noted. Largest measures 1.6 cm. Gallbladder wall is minimally prominent at 3 mm. Trace pericholecystic fluid. Negative Murphy sign. Common bile duct: Diameter: 6 mm Liver: Increased echogenicity consistent fatty infiltration or hepatocellular disease. Portal vein is patent on color Doppler imaging with normal direction of blood flow towards the liver. Other: None. IMPRESSION: 1. Gallstones again noted. Largest measures 1.6 cm. Gallbladder wall is minimally prominent at 3 mm. Trace pericholecystic fluid. Cholecystitis cannot be excluded. Negative Murphy sign. No biliary distention. 2. Increased echogenicity of the liver consistent fatty infiltration or hepatocellular disease. Electronically Signed   By: Marcello Moores  Register   On: 09/24/2020 09:15    Orson Eva, DO  [facility]  If 7PM-7AM, please contact night-coverage [email or web address] Password [email or web address] 10/22/2020, 7:05 PM   LOS: 4 days

## 2020-10-23 ENCOUNTER — Encounter (HOSPITAL_COMMUNITY): Payer: Self-pay | Admitting: Gastroenterology

## 2020-10-23 DIAGNOSIS — D649 Anemia, unspecified: Secondary | ICD-10-CM

## 2020-10-23 DIAGNOSIS — K274 Chronic or unspecified peptic ulcer, site unspecified, with hemorrhage: Secondary | ICD-10-CM

## 2020-10-23 DIAGNOSIS — K269 Duodenal ulcer, unspecified as acute or chronic, without hemorrhage or perforation: Secondary | ICD-10-CM | POA: Diagnosis not present

## 2020-10-23 DIAGNOSIS — R1011 Right upper quadrant pain: Secondary | ICD-10-CM | POA: Diagnosis not present

## 2020-10-23 LAB — CBC
HCT: 29.3 % — ABNORMAL LOW (ref 36.0–46.0)
Hemoglobin: 9.3 g/dL — ABNORMAL LOW (ref 12.0–15.0)
MCH: 27.5 pg (ref 26.0–34.0)
MCHC: 31.7 g/dL (ref 30.0–36.0)
MCV: 86.7 fL (ref 80.0–100.0)
Platelets: 182 10*3/uL (ref 150–400)
RBC: 3.38 MIL/uL — ABNORMAL LOW (ref 3.87–5.11)
RDW: 15.9 % — ABNORMAL HIGH (ref 11.5–15.5)
WBC: 3.9 10*3/uL — ABNORMAL LOW (ref 4.0–10.5)
nRBC: 0 % (ref 0.0–0.2)

## 2020-10-23 NOTE — Progress Notes (Signed)
Patient tolerated two mango [place] ices prior to falling asleep

## 2020-10-23 NOTE — Progress Notes (Signed)
    Subjective: Abdominal pain improving. Oatmeal for breakfast. No N/V. No overt GI bleeding.   Objective: Vital signs in last 24 hours: Temp:  [97.2 F (36.2 C)-98.1 F (36.7 C)] 97.2 F (36.2 C) (03/02 0559) Pulse Rate:  [50-64] 50 (03/02 0559) Resp:  [17] 17 (03/01 2137) BP: (121-143)/(71-77) 121/71 (03/02 0559) SpO2:  [95 %-98 %] 95 % (03/02 0559) Last BM Date: 10/19/20 General:   Alert and oriented, pleasant Head:  Normocephalic and atraumatic. Eyes:  No icterus, sclera clear. Conjuctiva pink.  Abdomen:  Bowel sounds present, soft, obese, mild TTP RUQ, non-distended, no rebound or guarding Psych:  Alert and cooperative. Normal mood and affect.  Intake/Output from previous day: 03/01 0701 - 03/02 0700 In: 720 [P.O.:720] Out: -  Intake/Output this shift: No intake/output data recorded.  Lab Results: Recent Labs    10/21/20 0640 10/22/20 0507 10/23/20 0607  WBC 3.7* 3.4* 3.9*  HGB 9.4* 9.6* 9.3*  HCT 30.1* 29.9* 29.3*  PLT 179 174 182   BMET Recent Labs    10/21/20 0640 10/22/20 0507  NA 142 141  K 3.6 3.6  CL 109 108  CO2 25 25  GLUCOSE 89 87  BUN 6* 6*  CREATININE 0.66 0.75  CALCIUM 8.8* 8.6*    Assessment: 65 year old female with history of perforated peptic ulcer in August 2021 s/p Phillip Heal patch, hospitalized again in Nov 2021 with N/V and abdominal pain, undergoing EGD with healed gastric ulcer and two non-bleeding cratered duodenal ulcers in duodenal bulb (H.pylori serology negative), known cholelithiasis, enlarging pancreatic cystic mass concerning 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 She has had numerous admissions for the same. No surgical indication for cholecystectomy at this time.   Underwent EGD this admission with few gastric erosions, 1 superficial ulcer in pylorus with clean base, large duodenal ulcer in duodenal bulb with presence of suture at bed of ulcer. Gastrin level 19. Now on Protonix BID.  Clinically improved since admission, no overt GI bleeding, and Xarelto restarted yesterday. Unclear why persistent ulcer disease.    Anemia: overall stable. Xarelto restarted yesterday. Remains without overt GI bleeding.   Pancreatic mass: keep plans for EUS upcoming 11/20/20 by Dr. Rush Landmark.   GI will sign off for now and arrange follow-up in office.    Plan: Soft diet PPI BID Carafate QID Avoidance of NSAIDs Continue Xarelto Monitor for bleeding Repeat CBC later this week as outpatient: Will arrange Keep plans for EUS upcoming with Dr. Rush Landmark GI will sign off.    Annitta Needs, PhD, ANP-BC [facility]    LOS: 5 days    10/23/2020, 9:52 AM

## 2020-10-23 NOTE — Progress Notes (Signed)
Nsg Discharge Note  Admit Date:  10/18/2020 Discharge date: 10/23/2020   Zandra Abts to be D/C'd Home per MD order.  AVS completed.  Copy for chart, and copy for patient signed, and dated. Patient/caregiver able to verbalize understanding.  Discharge Medication: Allergies as of 10/23/2020      Reactions   Wheat Extract Swelling      Medication List    STOP taking these medications   citalopram 20 MG tablet Commonly known as: CELEXA   dicyclomine 10 MG capsule Commonly known as: BENTYL   lisinopril-hydrochlorothiazide 10-12.5 MG tablet Commonly known as: ZESTORETIC   traZODone 100 MG tablet Commonly known as: DESYREL     TAKE these medications   acetaminophen 500 MG tablet Commonly known as: TYLENOL Take 1 tablet (500 mg total) by mouth every 6 (six) hours as needed for mild pain or fever. What changed: when to take this   diclofenac Sodium 1 % Gel Commonly known as: Voltaren Apply 2 g topically 4 (four) times daily.   donepezil 10 MG tablet Commonly known as: ARICEPT Take 0.5-1 tablets (5-10 mg total) by mouth in the morning and at bedtime.   gabapentin 300 MG capsule Commonly known as: NEURONTIN Take 1 capsule (300 mg total) by mouth 2 (two) times daily.   lipase/protease/amylase 36000 UNITS Cpep capsule Commonly known as: Creon Take 2 capsules (72,000 Units total) by mouth 3 (three) times daily with meals. May also take 1 capsule (36,000 Units total) as needed (with snacks).   mirtazapine 15 MG tablet Commonly known as: Remeron Take 1 tablet (15 mg total) by mouth at bedtime.   ondansetron 8 MG disintegrating tablet Commonly known as: Zofran ODT Take 1 tablet (8 mg total) by mouth every 8 (eight) hours as needed for nausea or vomiting.   oxyCODONE 5 MG immediate release tablet Commonly known as: Roxicodone 1 to 2 tablets (5-10 mg) by mouth as needed every 4 hours for pain   pantoprazole 40 MG tablet Commonly known as: PROTONIX Take 1 tablet (40 mg  total) by mouth 2 (two) times daily.   rivaroxaban 20 MG Tabs tablet Commonly known as: XARELTO Take 1 tablet (20 mg total) by mouth daily with supper. To start after initial loading dose pack.   sucralfate 1 GM/10ML suspension Commonly known as: CARAFATE Take 10 mLs (1 g total) by mouth 4 (four) times daily -  with meals and at bedtime.       Discharge Assessment: Vitals:   10/22/20 2137 10/23/20 0559  BP: (!) 143/77 121/71  Pulse: 64 (!) 50  Resp: 17   Temp: 97.7 F (36.5 C) (!) 97.2 F (36.2 C)  SpO2: 98% 95%   Skin clean, dry and intact without evidence of skin break down, no evidence of skin tears noted. IV catheter discontinued intact. Site without signs and symptoms of complications - no redness or edema noted at insertion site, patient denies c/o pain - only slight tenderness at site.  Dressing with slight pressure applied.  D/c Instructions-Education: Discharge instructions given to patient/family with verbalized understanding. D/c education completed with patient/family including follow up instructions, medication list, d/c activities limitations if indicated, with other d/c instructions as indicated by MD - patient able to verbalize understanding, all questions fully answered. Patient instructed to return to ED, call 911, or call MD for any changes in condition.  Patient escorted via [place], and D/C home via private auto.  Dorcas Mcmurray, LPN 0/03/6577 4:69 PM

## 2020-10-23 NOTE — Discharge Summary (Signed)
Physician Discharge Summary [facility]    Patient: Maria Murphy                   Admit date: 10/18/2020   DOB: Jan 09, 1956             Discharge date:10/23/2020/11:01 AM HEN:[id number]                          PCP: Ladell Pier, MD  Disposition: HOME / SNF / HOME with Home Health   Recommendations for Outpatient Follow-up:   . Follow up: in 2 weeks  Discharge Condition: Stable   Code Status:   Code Status: Full Code  Diet recommendation: Regular healthy diet   Discharge Diagnoses:    Principal Problem:   GI bleed Active Problems:   Essential hypertension   Abdominal pain   Duodenal ulcer   Nausea vomiting and diarrhea   Pancreatic mass   History of pulmonary embolus (PE)   Obesity (BMI 30-39.9)   GERD (gastroesophageal reflux disease)   Melena   Gastric ulcer   Anemia   History of Present Illness/ Hospital Course Kathleen Argue Summary:   Maria Murphy  is a 65 year old female with history of hypertension, peptic ulcer disease with perforation, saddle pulmonary embolus on rivaroxaban, depression presenting with 10-day history of intermittent abdominal pain and nausea and vomiting. The patient states that she has had approximately 2-3 episodes of emesis daily with upper abdominal pain in the epigastric and left upper quadrant region. She denies any fevers, chills, chest pain, shortness breath, coughing, hemoptysis. She has not had any hematemesis. She has had loose stools with occasional melena. The patient has had 2 recent emergency department visits. 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 There was also more prominent main pancreatic duct.There is also an inflamed appearance of the distal stomach and proximal duodenum with adjacent fat stranding that was increased since 09/24/2020. There is mild inflamed appearance of the gallbladder and porta  hepatis. The patient was discharged home in stable condition after fluids and conservative care in the emergency department with a prescription for oxycodone. The patient endorses compliance with her Protonix and Carafate.  The patient states that she has not taken any NSAIDs recently. She endorses compliance with her rivaroxaban. Notably, the patient has had a complicated history of peptic ulcer disease secondary to NSAID use. She had a prolonged hospitalization from 04/21/2020 to 05/17/2020 secondary to a perforated gastric ulcer. She underwent exploratory laparotomy with Phillip Heal patch placed on 04/22/2021. This was complicated by intra-abdominal abscess for which required IR aspiration. That hospitalization was also complicated by a pulmonary embolus for which the patient was started on rivaroxaban. She was subsequently remitted to the hospital on 07/05/2020 to 07/12/2020 for intractable nausea and vomiting. Fortunately, the patient improved with conservative/nonoperative management. She underwent EGD on 07/10/2020 which showed a scar in the gastric antrum and prepyloric region. There was nonbleeding duodenal ulcers. Once again, the patient was readmitted from 09/23/2020 to2/4/22 for abdominal pain and intractable nausea and vomiting. Again, the patient improved with conservative care.  In the emergency department, the patient was afebrile hemodynamically stable with oxygen saturation 100% room air. Sodium was 137, potassium 3.9, serum creatinine 0.81. LFTs were unremarkable. Lipase was 31. WBC 6.6, hemoglobin 9.9, platelets 208,000. The patient was started on a Protonix drip. GI was consulted to  assist with management.  Intractable abdominal pain/vomiting/melena -Suspect this is secondary to her peptic ulcer disease -Continue Protonix dripthrough 10/20/20 -GI consultappreciated -10/18/20 EGD--nonbleeding gastric ulcers;large duodenal ulcer in the duodenal bulb, close to 35 mm in  diameter;no presence of stigmata of recent bleeding but there was presence of a suture at the bed of the ulcer which was semicircumferential -Avoid NSAIDs -Holding rivaroxaban temporarily -10/15/2020 CT abdomen--as discussed above -10/18/20 AXR--mod to large stool burden with increase air distension of stomach to mid SB -10/19/20-discussed with GI, Dr. Abbey Chatters -10/21/20--no further n/v, advanced to full liquids -10/22/20--restart xarelto  Inflamed gallbladder appearance -09/18/2020 HIDA scan normal -10/18/20 RUQ US--Cholelithiasis and mild gallbladder wall thickening.+murphy's -Previously seen by surgery who did not feel that her clinical presentation was consistent with cholecystitis -appreciate general consult-->did not feel patient has cholecysitis -stable   Pancreatic head mass -This has been followed by surveillance by GI -EUS upcoming 11/20/20 by Dr. Bruce Donath -baseline Hgb 10-11 -Hgb nadir 9.3 -Hgb stable no wow   History of pulmonary embolus -Patient had submassive/saddle embolus on CTA on 05/01/2020 -Temporarily holding rivaroxaban -patient has been taking faithfully since beginning of Nov 2021 -plan 6 full months of tx-->end of April  Essential hypertension -Holding lisinopril HCTZ for now -Monitor BP -IV labetalol as neededSBP >180  Depression -Restart Celexa and trazodone once able to tolerate p.o. -Restart Remeron once able to tolerate p.o.  Atypical Chest pain -due to GI effects -personally reviewed EKG--sinus, no STT changes -Troponins neg   Dispo: The patient is from:Home Anticipated d/c is XQ:[facility]       Discharge Instructions:   Discharge Instructions    Activity as tolerated - No restrictions   Complete by: As directed    Diet - low sodium heart healthy   Complete by: As directed    Discharge instructions   Complete by: As directed    Follow with a gastroenterologist, for your scheduled  endoscopy. Continue taking your Protonix, continue Xarelto till April... Follow-up with PCP regarding discontinuing Xarelto in April   Increase activity slowly   Complete by: As directed        Medication List    STOP taking these medications   citalopram 20 MG tablet Commonly known as: CELEXA   dicyclomine 10 MG capsule Commonly known as: BENTYL   lisinopril-hydrochlorothiazide 10-12.5 MG tablet Commonly known as: ZESTORETIC   traZODone 100 MG tablet Commonly known as: DESYREL     TAKE these medications   acetaminophen 500 MG tablet Commonly known as: TYLENOL Take 1 tablet (500 mg total) by mouth every 6 (six) hours as needed for mild pain or fever. What changed: when to take this   diclofenac Sodium 1 % Gel Commonly known as: Voltaren Apply 2 g topically 4 (four) times daily.   donepezil 10 MG tablet Commonly known as: ARICEPT Take 0.5-1 tablets (5-10 mg total) by mouth in the morning and at bedtime.   gabapentin 300 MG capsule Commonly known as: NEURONTIN Take 1 capsule (300 mg total) by mouth 2 (two) times daily.   lipase/protease/amylase 36000 UNITS Cpep capsule Commonly known as: Creon Take 2 capsules (72,000 Units total) by mouth 3 (three) times daily with meals. May also take 1 capsule (36,000 Units total) as needed (with snacks).   mirtazapine 15 MG tablet Commonly known as: Remeron Take 1 tablet (15 mg total) by mouth at bedtime.   ondansetron 8 MG disintegrating tablet Commonly known as: Zofran ODT Take 1 tablet (8 mg total) by mouth every  8 (eight) hours as needed for nausea or vomiting.   oxyCODONE 5 MG immediate release tablet Commonly known as: Roxicodone 1 to 2 tablets (5-10 mg) by mouth as needed every 4 hours for pain   pantoprazole 40 MG tablet Commonly known as: PROTONIX Take 1 tablet (40 mg total) by mouth 2 (two) times daily.   rivaroxaban 20 MG Tabs tablet Commonly known as: XARELTO Take 1 tablet (20 mg total) by mouth daily with  supper. To start after initial loading dose pack.   sucralfate 1 GM/10ML suspension Commonly known as: CARAFATE Take 10 mLs (1 g total) by mouth 4 (four) times daily -  with meals and at bedtime.       Allergies  Allergen Reactions  . Wheat Extract Swelling     Procedures /Studies:   CT ABDOMEN PELVIS W CONTRAST  Result Date: 10/15/2020 CLINICAL DATA:  65 year old female with abdominal pain for 6 days. History of exploratory laparotomy, perforated gastric ulcer in August 2021. EXAM: CT ABDOMEN AND PELVIS WITH CONTRAST TECHNIQUE: Multidetector CT imaging of the abdomen and pelvis was performed using the standard protocol following bolus administration of intravenous contrast. CONTRAST:  162mL OMNIPAQUE IOHEXOL 300 MG/ML  SOLN COMPARISON:  CT Abdomen and Pelvis 09/24/2020 and earlier, including MRI 04/22/2020. FINDINGS: Lower chest: Stable lung bases and cardiac size at the upper limits of normal. No pericardial or pleural effusion. Hepatobiliary: Chronic cholelithiasis with gallstones individually up to 19 mm. Fat at the porta hepatis and the gallbladder wall remain indistinct similar to earlier this month. No progressive gallbladder wall thickening. No intrahepatic biliary ductal dilatation. Stable liver enhancement including tiny low-density area near the dome on the right which has a benign appearance. No perihepatic free fluid. Pancreas: Multi cystic, partially calcified mass of the pancreatic head measuring up to 43 mm long axis (series 2, image 29) appears larger since August 2021 by up to 10 mm. Main pancreatic duct also appears more prominent since that time. But there is no convincing primary pancreatic inflammation. No free fluid in the lesser sac. Spleen: Negative. Adrenals/Urinary Tract: Normal adrenal glands. Both kidneys are stable since August including mildly complex partially calcified exophytic right upper pole cystic lesion - which showed no suspicious enhancement on August MRI.  Superimposed benign appearing parapelvic and left renal midpole cysts. Normal renal contrast excretion on delayed images. No perinephric stranding. No definite nephrolithiasis. Decompressed ureters. Negative urinary bladder. Chronic pelvic phleboliths. Stomach/Bowel: Negative rectum. Extensive sigmoid diverticulosis with no active inflammation. Redundant transverse colon. Retained stool in the transverse and right colon. Cecum is on a lax mesentery located in the right mid abdomen. Normal appendix visible on series 5, image 30. No large bowel inflammation. Negative terminal ileum. No dilated small bowel. Small gastric hiatal hernia. No free air. No free fluid identified. But there remains an indistinct appearance of the gastric antrum and proximal duodenum (series 2, image 24 and series 5, image 24) with increased mesenteric stranding from earlier this month. Regional mucosal hyperenhancement and wall thickening. No extraluminal gas identified. Vascular/Lymphatic: Major arterial structures in the abdomen and pelvis are patent and mildly tortuous. Mild Aortoiliac calcified atherosclerosis. Portal venous system is patent. No lymphadenopathy. Reproductive: Lobulated, fibroid uterus including multiple exophytic, subserosal fibroids anteriorly and at the fundus which are individually up to 37 mm diameter and not significantly changed since 04/20/2020. Adnexa appear stable and within normal limits. Other: No pelvic free fluid. Chronic postoperative changes to the ventral upper abdomen are stable. Musculoskeletal: Intermittent advanced disc and  endplate degeneration in the visible spine. Grade 1 lumbar spondylolisthesis. No acute osseous abnormality identified. IMPRESSION: 1. 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 This was evaluated by MRI in August 2021 (please see that report), and appears mildly larger (4.3 cm) with increased conspicuity of the pancreatic duct since that time. 2.  Continued inflamed appearance of the distal stomach and proximal duodenum compatible with Recurrent Acute Peptic Ulcer Disease. Adjacent mesenteric fat stranding seems increased since 09/24/2020, although there is no evidence of perforation. 3. Persistent mildly inflamed appearance of the gallbladder and porta hepatis region also, with underlying Cholelithiasis. 4. Otherwise stable abdomen and pelvis, including renal cysts which had a benign MRI appearance, sigmoid diverticulosis, fibroid uterus. Electronically Signed   By: Genevie Ann M.D.   On: 10/15/2020 04:42   CT Abdomen Pelvis W Contrast  Result Date: 09/24/2020 CLINICAL DATA:  Nausea, vomiting and diarrhea for 2 days EXAM: CT ABDOMEN AND PELVIS WITH CONTRAST TECHNIQUE: Multidetector CT imaging of the abdomen and pelvis was performed using the standard protocol following bolus administration of intravenous contrast. CONTRAST:  155mL OMNIPAQUE IOHEXOL 300 MG/ML  SOLN COMPARISON:  CT 08/07/2020 04/22/2020 FINDINGS: Lower chest: Atelectatic changes in the wrist clear lung bases. Cardiac size is top normal. No pericardial effusion. Few coronary artery calcifications. Hepatobiliary: Few subcentimeter hypoattenuating foci in the liver too small to fully characterize on CT imaging but statistically likely benign. No concerning focal liver lesions. Smooth liver surface contour. Normal liver attenuation. Gallbladder contains multiple partially calcified gallstones several of which appear partially pneumatized as well. There is some increasing pericholecystic inflammatory change when compared to prior imaging. No visible intraductal gallstones or biliary ductal dilatation is seen however. Pancreas: Redemonstration of the multilobulated cystic lesion in the head of the pancreas with few punctate calcifications again measuring up to 3.5 by 4.0 cm in maximum transaxial dimension, not significantly changed from comparison studies. Previously characterized on prior MR imaging  04/22/2020 with a differential including mucinous cystic neoplasm versus sequela of prior pancreatitis. Remaining portions of the pancreas are unremarkable without new or concerning pancreatic lesion, ductal dilatation, or peripancreatic inflammation. Spleen: Normal in size. No concerning splenic lesions. Adrenals/Urinary Tract: Normal adrenal glands. Kidneys are normal in size and position. Multiple simple and parapelvic cysts are noted bilaterally. A slightly more complex, partially exophytic peripherally calcified cystic lesion measuring up to 1.8 cm is seen arising from the upper pole right kidney also previously characterized on prior MR imaging is a complex though likely benign cyst. No new or concerning renal lesions. No frank hydronephrosis or obstructive urolithiasis. There is some mild faint perivesicular hazy stranding and some questionable layering debris versus asymmetric thickening of the left posterolateral bladder wall. Stomach/Bowel: Small sliding-type hiatal hernia. Proximal stomach is unremarkable. There is a circumferentially thickened appearance of the duodenal fold and gastric antrum. Mild mucosal hyperemia and possible ulceration (5/23) is present as well. More distal duodenum is unremarkable. No other small bowel thickening or dilatation. A normal appendix is visualized. No colonic dilatation or wall thickening. Scattered colonic diverticula without focal inflammation to suggest diverticulitis. Relative lack of formed stool could reflect a rapid transit state/diarrheal illness. Vascular/Lymphatic: Atherosclerotic calcification in the iliac arteries. Other significant vascular findings. No suspicious or enlarged lymph nodes in the included lymphatic chains. Reproductive: Lobular, heterogeneously enhancing lesions with uterus including both intramural and partially exophytic foci keeping with a diagnosis of uterine leiomyomata. No concerning adnexal lesions. Other: No abdominopelvic free fluid  or free gas. No bowel containing  hernias. Prior vertical midline incision. Mild circumferential body wall edema most pronounced posteriorly. Musculoskeletal: Multilevel degenerative changes are present in the imaged portions of the spine. More sclerotic changes about a region of disc desiccation at the T11-12 level is similar to prior and favored to be on a degenerative basis. Grade 1 anterolisthesis L4 on 5 without associated spondylolysis. 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 Additional moderate degenerative changes in the hips and pelvis. No acute or conspicuous osseous lesions. IMPRESSION: 1. Circumferentially thickened appearance of the gastric antrum and duodenal bulb with some anemia mucosal hyperemia which could reflect some gastritis/duodenitis with a site of possible ulceration. No evidence of frank perforation or abscess. Correlate with endoscopy as clinically warranted. 2. Cholelithiasis with some slightly increasing pericholecystic inflammatory change when compared to prior imaging. If there is clinical concern for acute cholecystitis, recommend further evaluation with right upper quadrant ultrasound. 3. Relative lack of formed stool could reflect a rapid transit state/diarrheal illness. 4. Unchanged appearance of the multilobulated cystic lesion in the head of the pancreas with few punctate calcifications. Previously characterized on prior MR imaging 04/22/2020 with a differential including mucinous cystic neoplasm versus sequela of prior pancreatitis. 5. Minimally complex exophytic cystic lesion from the right kidney, previously characterized on MR imaging is a probable benign cyst. Additional fluid attenuation cysts in both kidneys. Acute urinary tract abnormality. 6. Fibroid uterus. 7. Aortic Atherosclerosis (ICD10-I70.0). Electronically Signed   By: Lovena Le M.D.   On: 09/24/2020 02:36   US Abdomen Limited  Result Date: 10/18/2020 CLINICAL  DATA:  Abdominal pain.  History of gallbladder stones. EXAM: ULTRASOUND ABDOMEN LIMITED RIGHT UPPER QUADRANT COMPARISON:  CT of the abdomen and pelvis on 10/15/2020 and ultrasound of the abdomen 09/24/2020 FINDINGS: Gallbladder: Gallbladder wall is mildly thickened, 3.6 millimeters. There are numerous stones within the gallbladder, measuring up to 1.5 centimeters. No pericholecystic fluid. Note is made of a positive sonographic Murphy's sign. Common bile duct: Diameter: 5.8 millimeters Liver: Focal areas of increased attenuation particularly within the gallbladder fossa, consistent with focal fatty infiltration. No focal liver lesions are identified. Portal vein is patent on color Doppler imaging with normal direction of blood flow towards the liver. Other: None. IMPRESSION: 1. Cholelithiasis and mild gallbladder wall thickening. 2. Sonographic Murphy's sign is present today, new since the previous ultrasound exam. Consider acute cholecystitis. 3. There is no pericholecystic fluid. 4. Focal fatty infiltration of the liver. These results will be called to the ordering clinician or representative by the Radiologist Assistant, and communication documented in the PACS or [employer]. Electronically Signed   By: Nolon Nations M.D.   On: 10/18/2020 10:22   DG Chest Port 1 View  Result Date: 09/24/2020 CLINICAL DATA:  Shortness of breath and cough EXAM: PORTABLE CHEST 1 VIEW COMPARISON:  05/02/2020 FINDINGS: Cardiac shadow is stable. Lungs are well aerated bilaterally. No focal infiltrate or sizable effusion is seen. No bony abnormality is noted. IMPRESSION: No active disease. Electronically Signed   By: Inez Catalina M.D.   On: 09/24/2020 01:25   DG Abdomen Acute W/Chest  Result Date: 10/18/2020 CLINICAL DATA:  Abdominal pain post surgery for perirectal abscess EXAM: DG ABDOMEN ACUTE WITH 1 VIEW CHEST COMPARISON:  Radiograph 10/16/2020, CT 10/15/2020 FINDINGS: Streaky basilar opacities and low volumes  favoring atelectasis. No consolidation, features of edema, pneumothorax, or effusion. Pulmonary vascularity is normally distributed. The cardiomediastinal contours are unremarkable. No subdiaphragmatic free air. Redemonstrated calcification at the level of the pancreatic head  corresponding to a lesion on comparison CT. There is increasing air distention of the stomach and mid abdominal small bowel with a moderate to large colonic stool burden. Few calcified gallstones are less well visualized on this exam. No acute osseous abnormality. Degenerative changes in the shoulders, spine, hips and pelvis. The osseous structures appear diffusely demineralized which may limit detection of small or nondisplaced fractures. No worrisome findings of the remaining soft tissues. IMPRESSION: 1. Moderate to large colonic stool burden with increasing air distention of the stomach and mid abdominal small bowel possibly related to constipation though could reflect an early or developing ileus in the appropriate clinical setting. No free air. 2. Calcification of the level of the pancreatic head corresponding to a pancreatic mass on comparison CT. 3. Lung volumes are low with streaky basilar opacities favoring atelectasis. Electronically Signed   By: Lovena Le M.D.   On: 10/18/2020 04:09   DG Abdomen Acute W/Chest  Result Date: 10/16/2020 CLINICAL DATA:  Abdominal pain, change EXAM: DG ABDOMEN ACUTE WITH 1 VIEW CHEST COMPARISON:  CT 10/15/2020, ultrasound 09/24/2020, radiograph 09/24/2020 FINDINGS: Some mildly coarsened interstitial changes are similar to prior with basilar atelectasis. No consolidation, features of edema, pneumothorax, or effusion. Unchanged cardiomediastinal contours. No subdiaphragmatic free air. No high-grade obstructive bowel gas pattern. Moderate colonic stool burden. Redemonstration of the calcification at the level of the pancreatic head associated with a cystic pancreatic neoplasm on comparison CT imaging.  Few calcified gallstones are noted as well. Vascular calcium of the abdomen and pelvis. Few phleboliths in the deep pelvis. Extensive degenerative changes in the shoulders, spine, hips and pelvis. The osseous structures appear diffusely demineralized which may limit detection of small or nondisplaced fractures. No acute osseous abnormality or suspicious osseous lesion. IMPRESSION: 1. 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 No acute cardiopulmonary abnormality. 2. Nonobstructive bowel gas pattern. Moderate colonic stool burden. 3. Calcification associated with the pancreatic lesion on comparison CT. Partly visualized gallstones as well. Electronically Signed   By: Lovena Le M.D.   On: 10/16/2020 05:26   US ABDOMEN LIMITED RUQ (LIVER/GB)  Result Date: 09/24/2020 CLINICAL DATA:  Abdominal pain.  History of gallstones. EXAM: ULTRASOUND ABDOMEN LIMITED RIGHT UPPER QUADRANT COMPARISON:  Ultrasound 07/28/2020.  CT 07/28/2020. FINDINGS: Gallbladder: Gallstones again noted. Largest measures 1.6 cm. Gallbladder wall is minimally prominent at 3 mm. Trace pericholecystic fluid. Negative Murphy sign. Common bile duct: Diameter: 6 mm Liver: Increased echogenicity consistent fatty infiltration or hepatocellular disease. Portal vein is patent on color Doppler imaging with normal direction of blood flow towards the liver. Other: None. IMPRESSION: 1. Gallstones again noted. Largest measures 1.6 cm. Gallbladder wall is minimally prominent at 3 mm. Trace pericholecystic fluid. Cholecystitis cannot be excluded. Negative Murphy sign. No biliary distention. 2. Increased echogenicity of the liver consistent fatty infiltration or hepatocellular disease. Electronically Signed   By: Marcello Moores  Register   On: 09/24/2020 09:15     Subjective:   Patient was seen and examined 10/23/2020, 11:01 AM Patient stable today. No acute distress.  No issues overnight Stable for discharge.  Discharge Exam:     Vitals:   10/22/20 0457 10/22/20 1414 10/22/20 2137 10/23/20 0559  BP: 124/75 124/77 (!) 143/77 121/71  Pulse: (!) 57 (!) 55 64 (!) 50  Resp: 18  17   Temp: 98 F (36.7 C) 98.1 F (36.7 C) 97.7 F (36.5 C) (!) 97.2 F (36.2 C)  TempSrc:  Oral Oral   SpO2: 95% 96% 98% 95%  Weight:      Height:        General: Pt lying comfortably in bed & appears in no obvious distress. Cardiovascular: S1 & S2 heard, RRR, S1/S2 +. No murmurs, rubs, gallops or clicks. No JVD or pedal edema. Respiratory: Clear to auscultation without wheezing, rhonchi or crackles. No increased work of breathing. Abdominal:  Non-distended, non-tender & soft. No organomegaly or masses appreciated. Normal bowel sounds heard. CNS: Alert and oriented. No focal deficits. Extremities: no edema, no cyanosis      The results of significant diagnostics from this hospitalization (including imaging, microbiology, ancillary and laboratory) are listed below for reference.      Microbiology:   Recent Results (from the past 240 hour(s))  Resp Panel by RT-PCR (Flu A&B, Covid) Nasopharyngeal Swab     Status: None   Collection Time: 10/18/20  3:52 AM   Specimen: Nasopharyngeal Swab; Nasopharyngeal(NP) swabs in vial transport medium  Result Value Ref Range Status   SARS Coronavirus 2 by RT PCR NEGATIVE NEGATIVE Final    Comment: (NOTE) SARS-CoV-2 target nucleic acids are NOT DETECTED.  The SARS-CoV-2 RNA is generally detectable in upper respiratory specimens during the acute phase of infection. The lowest concentration of SARS-CoV-2 viral copies this assay can detect is 138 copies/mL. A negative result does not preclude SARS-Cov-2 infection and should not be used as the sole basis for treatment or other patient management decisions. A negative result may occur with  improper specimen collection/handling, submission of specimen other than nasopharyngeal swab, presence of viral mutation(s) within the areas targeted by  this assay, and inadequate number of viral copies(<138 copies/mL). A negative result must be combined with clinical observations, patient history, and epidemiological information. The expected result is Negative.  Fact Sheet for Patients:  EntrepreneurPulse.com.au  Fact Sheet for Healthcare Providers:  IncredibleEmployment.be  This test is no t yet approved or cleared by the [place] FDA and  has been authorized for detection and/or diagnosis of SARS-CoV-2 by FDA under an Emergency Use Authorization (EUA). This EUA will remain  in effect (meaning this test can be used) for the duration of the COVID-19 declaration under Section 564(b)(1) of the Act, 21 U.S.C.section 360bbb-3(b)(1), unless the authorization is terminated  or revoked sooner.       Influenza A by PCR NEGATIVE NEGATIVE Final   Influenza B by PCR NEGATIVE NEGATIVE Final    Comment: (NOTE) The Xpert Xpress SARS-CoV-2/FLU/RSV plus assay is intended as an aid in the diagnosis of influenza from Nasopharyngeal swab specimens and should not be used as a sole basis for treatment. Nasal washings and aspirates are unacceptable for Xpert Xpress SARS-CoV-2/FLU/RSV testing.  Fact Sheet for Patients: EntrepreneurPulse.com.au  Fact Sheet for Healthcare Providers: IncredibleEmployment.be  This test is not yet approved or cleared by the [place] FDA and has been authorized for detection and/or diagnosis of SARS-CoV-2 by FDA under an Emergency Use Authorization (EUA). This EUA will remain in effect (meaning this test can be used) for the duration of the COVID-19 declaration under Section 564(b)(1) of the Act, 21 U.S.C. section 360bbb-3(b)(1), unless the authorization is terminated or revoked.  Performed at [facility], [address] [place], [place] [address]      Labs:   CBC: Recent Labs  Lab 10/18/20 0319 10/19/20 0452 10/20/20 0601  10/21/20 0640 10/22/20 0507 10/23/20 0607  WBC 6.6 3.3* 3.1* 3.7* 3.4* 3.9*  NEUTROABS 4.4  --   --   --   --   --  HGB 9.9* 8.8* 9.6* 9.4* 9.6* 9.3*  HCT 31.4* 28.1* 30.3* 30.1* 29.9* 29.3*  MCV 87.5 89.2 88.1 89.6 87.4 86.7  PLT 208 167 173 179 174 488   Basic Metabolic Panel: Recent Labs  Lab 10/18/20 0319 10/19/20 0452 10/20/20 0601 10/21/20 0640 10/22/20 0507  NA 137 141 141 142 141  K 3.9 3.9 3.5 3.6 3.6  CL 107 111 111 109 108  CO2 22 22 25 25 25   GLUCOSE 121* 91 86 89 87  BUN 16 6* 5* 6* 6*  CREATININE 0.81 0.58 0.72 0.66 0.75  CALCIUM 8.8* 8.7* 8.5* 8.8* 8.6*  MG 2.1  --  2.0  --   --   PHOS 3.7  --   --   --   --    Liver Function Tests: Recent Labs  Lab 10/18/20 0319 10/19/20 0452  AST 20 15  ALT 7 6  ALKPHOS 43 33*  BILITOT 0.6 0.4  PROT 6.6 5.3*  ALBUMIN 4.0 3.1*   BNP (last 3 results) No results for input(s): BNP in the last 8760 hours. Cardiac Enzymes: No results for input(s): CKTOTAL, CKMB, CKMBINDEX, TROPONINI in the last 168 hours. CBG: No results for input(s): GLUCAP in the last 168 hours. Hgb A1c No results for input(s): HGBA1C in the last 72 hours. Lipid Profile No results for input(s): CHOL, HDL, LDLCALC, TRIG, CHOLHDL, LDLDIRECT in the last 72 hours. Thyroid function studies No results for input(s): TSH, T4TOTAL, T3FREE, THYROIDAB in the last 72 hours.  Invalid input(s): FREET3 Anemia work up No results for input(s): VITAMINB12, FOLATE, FERRITIN, TIBC, IRON, RETICCTPCT in the last 72 hours. Urinalysis    Component Value Date/Time   COLORURINE YELLOW 10/18/2020 0235   APPEARANCEUR CLEAR 10/18/2020 0235   LABSPEC 1.013 10/18/2020 0235   PHURINE 6.0 10/18/2020 0235   GLUCOSEU NEGATIVE 10/18/2020 0235   HGBUR NEGATIVE 10/18/2020 0235   BILIRUBINUR NEGATIVE 10/18/2020 0235   KETONESUR NEGATIVE 10/18/2020 0235   PROTEINUR NEGATIVE 10/18/2020 0235   NITRITE NEGATIVE 10/18/2020 0235   LEUKOCYTESUR SMALL (A) 10/18/2020 0235          Time coordinating discharge: Over 45 minutes  SIGNED: Deatra James, MD, FACP, FHM. [facility],  Please use [email or web address] to Page If 7PM-7AM, please contact night-coverage [email or web address], Password [facility] 10/23/2020, 11:01 AM

## 2020-10-24 ENCOUNTER — Telehealth: Payer: Self-pay

## 2020-10-24 ENCOUNTER — Encounter: Payer: Self-pay | Admitting: Internal Medicine

## 2020-10-24 ENCOUNTER — Telehealth (HOSPITAL_COMMUNITY): Payer: Self-pay | Admitting: Physical Therapy

## 2020-10-24 ENCOUNTER — Other Ambulatory Visit: Payer: Self-pay

## 2020-10-24 ENCOUNTER — Ambulatory Visit: Payer: Medicaid Other | Attending: Internal Medicine | Admitting: Internal Medicine

## 2020-10-24 ENCOUNTER — Telehealth: Payer: Self-pay | Admitting: *Deleted

## 2020-10-24 ENCOUNTER — Ambulatory Visit (HOSPITAL_COMMUNITY): Payer: Medicaid Other | Admitting: Physical Therapy

## 2020-10-24 ENCOUNTER — Encounter (HOSPITAL_COMMUNITY): Payer: Self-pay

## 2020-10-24 VITALS — BP 131/86 | HR 72 | Resp 16 | Wt 188.4 lb

## 2020-10-24 DIAGNOSIS — K264 Chronic or unspecified duodenal ulcer with hemorrhage: Secondary | ICD-10-CM | POA: Insufficient documentation

## 2020-10-24 DIAGNOSIS — Z79899 Other long term (current) drug therapy: Secondary | ICD-10-CM | POA: Diagnosis not present

## 2020-10-24 DIAGNOSIS — I10 Essential (primary) hypertension: Secondary | ICD-10-CM | POA: Insufficient documentation

## 2020-10-24 DIAGNOSIS — R197 Diarrhea, unspecified: Secondary | ICD-10-CM | POA: Insufficient documentation

## 2020-10-24 DIAGNOSIS — D649 Anemia, unspecified: Secondary | ICD-10-CM | POA: Diagnosis not present

## 2020-10-24 DIAGNOSIS — Z8659 Personal history of other mental and behavioral disorders: Secondary | ICD-10-CM | POA: Diagnosis not present

## 2020-10-24 DIAGNOSIS — M170 Bilateral primary osteoarthritis of knee: Secondary | ICD-10-CM | POA: Diagnosis not present

## 2020-10-24 DIAGNOSIS — Z7901 Long term (current) use of anticoagulants: Secondary | ICD-10-CM | POA: Diagnosis not present

## 2020-10-24 DIAGNOSIS — R413 Other amnesia: Secondary | ICD-10-CM | POA: Insufficient documentation

## 2020-10-24 DIAGNOSIS — I700 Atherosclerosis of aorta: Secondary | ICD-10-CM | POA: Diagnosis not present

## 2020-10-24 DIAGNOSIS — K254 Chronic or unspecified gastric ulcer with hemorrhage: Secondary | ICD-10-CM | POA: Diagnosis not present

## 2020-10-24 DIAGNOSIS — Z86711 Personal history of pulmonary embolism: Secondary | ICD-10-CM | POA: Insufficient documentation

## 2020-10-24 DIAGNOSIS — Z791 Long term (current) use of non-steroidal anti-inflammatories (NSAID): Secondary | ICD-10-CM | POA: Insufficient documentation

## 2020-10-24 DIAGNOSIS — G629 Polyneuropathy, unspecified: Secondary | ICD-10-CM | POA: Insufficient documentation

## 2020-10-24 DIAGNOSIS — K869 Disease of pancreas, unspecified: Secondary | ICD-10-CM | POA: Insufficient documentation

## 2020-10-24 DIAGNOSIS — Z09 Encounter for follow-up examination after completed treatment for conditions other than malignant neoplasm: Secondary | ICD-10-CM

## 2020-10-24 MED ORDER — FERROUS SULFATE 325 (65 FE) MG PO TABS: 325.0000 mg | ORAL_TABLET | Freq: Every day | ORAL | 0 refills | Status: DC

## 2020-10-24 MED ORDER — AMLODIPINE BESYLATE 2.5 MG PO TABS: 2.5000 mg | ORAL_TABLET | Freq: Every day | ORAL | 3 refills | Status: DC

## 2020-10-24 NOTE — Telephone Encounter (Signed)
Transition Care Management Follow-up Telephone Call Date of discharge and from where: 10/23/2020, [name]  Patient is being seen today by PCP - Dr Wynetta Emery at [facility]

## 2020-10-24 NOTE — Patient Instructions (Addendum)
Stop lisinopril/hydrochlorothiazide. We have started a different blood pressure medication called amlodipine that you'll take once a day. I have sent a prescription to the pharmacy for iron tablet for you to take daily. We'll check your iron level today.  We will plan to stop the Xarelto at the end of this month. By that time he would've completed 6 months of being on the medication.

## 2020-10-24 NOTE — Telephone Encounter (Signed)
Transition Care Management Unsuccessful Follow-up Telephone Call  Date of discharge and from where:  10/23/2020 - [facility]  Attempts:  1st Attempt  Reason for unsuccessful TCM follow-up call:  Left voice message

## 2020-10-24 NOTE — Telephone Encounter (Signed)
pt called to cx this appt due to she has another md appt in [place]
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
## 2020-10-25 LAB — IRON,TIBC AND FERRITIN PANEL
Ferritin: 116 ng/mL (ref 15–150)
Iron Saturation: 9 % — CL (ref 15–55)
Iron: 33 ug/dL (ref 27–139)
Total Iron Binding Capacity: 362 ug/dL (ref 250–450)
UIBC: 329 ug/dL (ref 118–369)

## 2020-10-25 NOTE — Telephone Encounter (Signed)
Transition Care Management Unsuccessful Follow-up Telephone Call  Date of discharge and from where:  10/23/2020 - [facility]  Attempts:  2nd Attempt  Reason for unsuccessful TCM follow-up call:  Left voice message

## 2020-10-29 ENCOUNTER — Encounter (HOSPITAL_COMMUNITY): Payer: Self-pay

## 2020-10-29 ENCOUNTER — Ambulatory Visit (HOSPITAL_COMMUNITY): Payer: Medicaid Other | Attending: Family Medicine

## 2020-10-29 ENCOUNTER — Other Ambulatory Visit: Payer: Self-pay

## 2020-10-29 DIAGNOSIS — R2689 Other abnormalities of gait and mobility: Secondary | ICD-10-CM | POA: Diagnosis present

## 2020-10-29 DIAGNOSIS — M25661 Stiffness of right knee, not elsewhere classified: Secondary | ICD-10-CM | POA: Diagnosis present

## 2020-10-29 DIAGNOSIS — M6281 Muscle weakness (generalized): Secondary | ICD-10-CM

## 2020-10-29 DIAGNOSIS — M25662 Stiffness of left knee, not elsewhere classified: Secondary | ICD-10-CM

## 2020-10-29 DIAGNOSIS — M25562 Pain in left knee: Secondary | ICD-10-CM | POA: Diagnosis present

## 2020-10-29 DIAGNOSIS — R29898 Other symptoms and signs involving the musculoskeletal system: Secondary | ICD-10-CM

## 2020-10-29 DIAGNOSIS — M25561 Pain in right knee: Secondary | ICD-10-CM | POA: Diagnosis present

## 2020-10-29 NOTE — Patient Instructions (Signed)
Place your foot flat on a step, bend knee forward and hold for 10-20" feels good stretch in knee joint.  Complete 5 to 10 reps per day.  Bridge    Lie back, legs bent. Inhale, pressing hips up. Keeping ribs in, lengthen lower back. Exhale, rolling down along spine from top. Repeat 10 times. Do 2 sessions per day.  http://pm.exer.us/55   Copyright  VHI. All rights reserved.   Straight Leg Raise (Prone)    Abdomen and head supported, keep left knee locked and raise leg at hip. Avoid arching low back. Repeat 15 times per set. Do 3 sets per session.   http://orth.exer.us/1113   Copyright  VHI. All rights reserved.   KNEE: Quadriceps - Prone    Place strap around ankle. Bring ankle toward buttocks. Press hip into surface.  Hold 30 seconds.  3 reps per set, 2 sets per day, 3-4 days per week   Copyright  VHI. All rights reserved.
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
## 2020-10-30 ENCOUNTER — Encounter (HOSPITAL_COMMUNITY): Payer: Self-pay

## 2020-10-30 ENCOUNTER — Telehealth: Payer: Self-pay | Admitting: Internal Medicine

## 2020-10-30 ENCOUNTER — Other Ambulatory Visit: Payer: Self-pay

## 2020-10-30 ENCOUNTER — Emergency Department (HOSPITAL_COMMUNITY)
Admission: EM | Admit: 2020-10-30 | Discharge: 2020-10-30 | Disposition: A | Discharge status: Home / Self Care | Payer: Medicaid Other | Attending: Emergency Medicine | Admitting: Emergency Medicine

## 2020-10-30 DIAGNOSIS — Z79899 Other long term (current) drug therapy: Secondary | ICD-10-CM | POA: Diagnosis not present

## 2020-10-30 DIAGNOSIS — R109 Unspecified abdominal pain: Secondary | ICD-10-CM | POA: Diagnosis present

## 2020-10-30 DIAGNOSIS — G8929 Other chronic pain: Secondary | ICD-10-CM | POA: Insufficient documentation

## 2020-10-30 DIAGNOSIS — Z760 Encounter for issue of repeat prescription: Secondary | ICD-10-CM | POA: Insufficient documentation

## 2020-10-30 DIAGNOSIS — I10 Essential (primary) hypertension: Secondary | ICD-10-CM | POA: Diagnosis not present

## 2020-10-30 DIAGNOSIS — Z85828 Personal history of other malignant neoplasm of skin: Secondary | ICD-10-CM | POA: Insufficient documentation

## 2020-10-30 LAB — POC OCCULT BLOOD, ED: Fecal Occult Bld: POSITIVE — AB

## 2020-10-30 LAB — CBC WITH DIFFERENTIAL/PLATELET
Abs Immature Granulocytes: 0.02 10*3/uL (ref 0.00–0.07)
Basophils Absolute: 0 10*3/uL (ref 0.0–0.1)
Basophils Relative: 0 %
Eosinophils Absolute: 0.1 10*3/uL (ref 0.0–0.5)
Eosinophils Relative: 3 %
HCT: 28.6 % — ABNORMAL LOW (ref 36.0–46.0)
Hemoglobin: 9.3 g/dL — ABNORMAL LOW (ref 12.0–15.0)
Immature Granulocytes: 0 %
Lymphocytes Relative: 22 %
Lymphs Abs: 1.1 10*3/uL (ref 0.7–4.0)
MCH: 27.8 pg (ref 26.0–34.0)
MCHC: 32.5 g/dL (ref 30.0–36.0)
MCV: 85.6 fL (ref 80.0–100.0)
Monocytes Absolute: 0.3 10*3/uL (ref 0.1–1.0)
Monocytes Relative: 6 %
Neutro Abs: 3.5 10*3/uL (ref 1.7–7.7)
Neutrophils Relative %: 69 %
Platelets: 178 10*3/uL (ref 150–400)
RBC: 3.34 MIL/uL — ABNORMAL LOW (ref 3.87–5.11)
RDW: 15.8 % — ABNORMAL HIGH (ref 11.5–15.5)
WBC: 5.1 10*3/uL (ref 4.0–10.5)
nRBC: 0 % (ref 0.0–0.2)

## 2020-10-30 LAB — COMPREHENSIVE METABOLIC PANEL
ALT: 8 U/L (ref 0–44)
AST: 17 U/L (ref 15–41)
Albumin: 3.8 g/dL (ref 3.5–5.0)
Alkaline Phosphatase: 46 U/L (ref 38–126)
Anion gap: 7 (ref 5–15)
BUN: 13 mg/dL (ref 8–23)
CO2: 22 mmol/L (ref 22–32)
Calcium: 8.5 mg/dL — ABNORMAL LOW (ref 8.9–10.3)
Chloride: 108 mmol/L (ref 98–111)
Creatinine, Ser: 0.76 mg/dL (ref 0.44–1.00)
GFR, Estimated: >60 mL/min (ref >60)
Glucose, Bld: 104 mg/dL — ABNORMAL HIGH (ref 70–99)
Potassium: 4 mmol/L (ref 3.5–5.1)
Sodium: 137 mmol/L (ref 135–145)
Total Bilirubin: 0.5 mg/dL (ref 0.3–1.2)
Total Protein: 6.2 g/dL — ABNORMAL LOW (ref 6.5–8.1)

## 2020-10-30 LAB — LIPASE, BLOOD: Lipase: 26 U/L (ref 11–51)

## 2020-10-30 MED ORDER — SODIUM CHLORIDE 0.9 % IV SOLN: INTRAVENOUS | Status: DC

## 2020-10-30 MED ORDER — HYDROMORPHONE HCL 1 MG/ML IJ SOLN
1.0000 mg | Freq: Once | INTRAMUSCULAR | Status: AC
  Administered 2020-10-30: 1 mg via INTRAVENOUS
  Filled 2020-10-30: qty 1

## 2020-10-30 MED ORDER — OXYCODONE HCL 5 MG PO TABS: 5.0000 mg | ORAL_TABLET | Freq: Four times a day (QID) | ORAL | 0 refills | Status: DC | PRN

## 2020-10-30 MED ORDER — ONDANSETRON HCL 4 MG/2ML IJ SOLN
4.0000 mg | Freq: Once | INTRAMUSCULAR | Status: AC
  Administered 2020-10-30: 4 mg via INTRAVENOUS
  Filled 2020-10-30: qty 2

## 2020-10-30 NOTE — Telephone Encounter (Signed)
Requested medication (s) are due for refill today:  yes  Requested medication (s) are on the active medication list:yes   Future visit scheduled:yes  Notes to clinic:this refill cannot be delegated  Patient is currently in the hospital    Requested Prescriptions  Pending Prescriptions Disp Refills   oxyCODONE (ROXICODONE) 5 MG immediate release tablet 15 tablet 0    Sig: 1 to 2 tablets (5-10 mg) by mouth as needed every 4 hours for pain      There is no refill protocol information for this order
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
## 2020-10-30 NOTE — Telephone Encounter (Signed)
Medication:  oxyCODONE (ROXICODONE) 5 MG immediate release tablet  Has the pt contacted their pharmacy? No.  pt told me she is still in the ED at [facility] and calling for this refill while she is still there..  Preferred pharmacy: [facility], [place] [address]  Please be advised refills may take up to 3 business days.  We ask that you follow up with your pharmacy.

## 2020-10-30 NOTE — ED Triage Notes (Signed)
States she was recently here for her abdominal pain and was prescribed  Oxycodone but has ran out and wants more

## 2020-10-30 NOTE — Discharge Instructions (Signed)
Prescribed 6 tablets of oxycodone for you to pick up at [facility] in [place].  Feel that to be able to fill this small prescription for you to help hold you over to get back to your primary care doctor.  Keep your appointments as scheduled for follow-up at the pancreatic abnormality as well as the stomach ulcers.  Blood counts here today are stable.  But if you start having red blood in your bowel movements or you feel like you are going to pass out you will need to get seen again.

## 2020-10-31 ENCOUNTER — Ambulatory Visit (HOSPITAL_COMMUNITY): Payer: Medicaid Other | Admitting: Physical Therapy

## 2020-10-31 ENCOUNTER — Encounter (HOSPITAL_COMMUNITY): Payer: Self-pay | Admitting: Physical Therapy

## 2020-10-31 ENCOUNTER — Telehealth: Payer: Self-pay | Admitting: *Deleted

## 2020-10-31 DIAGNOSIS — R2689 Other abnormalities of gait and mobility: Secondary | ICD-10-CM

## 2020-10-31 DIAGNOSIS — M25662 Stiffness of left knee, not elsewhere classified: Secondary | ICD-10-CM

## 2020-10-31 DIAGNOSIS — M25561 Pain in right knee: Secondary | ICD-10-CM | POA: Diagnosis not present

## 2020-10-31 DIAGNOSIS — M25562 Pain in left knee: Secondary | ICD-10-CM

## 2020-10-31 DIAGNOSIS — M6281 Muscle weakness (generalized): Secondary | ICD-10-CM

## 2020-10-31 DIAGNOSIS — M25661 Stiffness of right knee, not elsewhere classified: Secondary | ICD-10-CM

## 2020-10-31 DIAGNOSIS — R29898 Other symptoms and signs involving the musculoskeletal system: Secondary | ICD-10-CM
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
## 2020-10-31 NOTE — Telephone Encounter (Signed)
Transition Care Management Unsuccessful Follow-up Telephone Call  Date of discharge and from where:  10/30/2020 - [name]  Attempts:  1st Attempt  Reason for unsuccessful TCM follow-up call:  Unable to leave message

## 2020-10-31 NOTE — Patient Instructions (Signed)
Access Code: [email or web address] URL: [email or web address] Date: 10/31/2020 Prepared by: Mitzi Hansen Zaunegger  Exercises Sit to Stand with Arms Crossed - 1 x daily - 7 x weekly - 2 sets - 10 reps Single Leg Stance - 1 x daily - 7 x weekly - 3 reps - 30 second hold

## 2020-11-01 ENCOUNTER — Other Ambulatory Visit: Payer: Self-pay

## 2020-11-01 ENCOUNTER — Emergency Department (HOSPITAL_COMMUNITY): Payer: Medicaid Other

## 2020-11-01 ENCOUNTER — Inpatient Hospital Stay (HOSPITAL_COMMUNITY)
Admission: EM | Admit: 2020-11-01 | Discharge: 2020-11-03 | DRG: 378 | Disposition: A | Discharge status: Home / Self Care | Payer: Medicaid Other | Attending: Family Medicine | Admitting: Family Medicine

## 2020-11-01 ENCOUNTER — Encounter (HOSPITAL_COMMUNITY): Payer: Self-pay | Admitting: Emergency Medicine

## 2020-11-01 DIAGNOSIS — K921 Melena: Secondary | ICD-10-CM

## 2020-11-01 DIAGNOSIS — K8689 Other specified diseases of pancreas: Secondary | ICD-10-CM | POA: Diagnosis present

## 2020-11-01 DIAGNOSIS — K259 Gastric ulcer, unspecified as acute or chronic, without hemorrhage or perforation: Secondary | ICD-10-CM | POA: Diagnosis present

## 2020-11-01 DIAGNOSIS — I2602 Saddle embolus of pulmonary artery with acute cor pulmonale: Secondary | ICD-10-CM

## 2020-11-01 DIAGNOSIS — I10 Essential (primary) hypertension: Secondary | ICD-10-CM | POA: Diagnosis present

## 2020-11-01 DIAGNOSIS — R109 Unspecified abdominal pain: Secondary | ICD-10-CM | POA: Diagnosis present

## 2020-11-01 DIAGNOSIS — D62 Acute posthemorrhagic anemia: Secondary | ICD-10-CM | POA: Diagnosis present

## 2020-11-01 DIAGNOSIS — K264 Chronic or unspecified duodenal ulcer with hemorrhage: Secondary | ICD-10-CM | POA: Diagnosis present

## 2020-11-01 DIAGNOSIS — K219 Gastro-esophageal reflux disease without esophagitis: Secondary | ICD-10-CM | POA: Diagnosis present

## 2020-11-01 DIAGNOSIS — Z83438 Family history of other disorder of lipoprotein metabolism and other lipidemia: Secondary | ICD-10-CM

## 2020-11-01 DIAGNOSIS — D649 Anemia, unspecified: Secondary | ICD-10-CM

## 2020-11-01 DIAGNOSIS — R791 Abnormal coagulation profile: Secondary | ICD-10-CM

## 2020-11-01 DIAGNOSIS — Z7901 Long term (current) use of anticoagulants: Secondary | ICD-10-CM

## 2020-11-01 DIAGNOSIS — K869 Disease of pancreas, unspecified: Secondary | ICD-10-CM | POA: Diagnosis present

## 2020-11-01 DIAGNOSIS — G8929 Other chronic pain: Secondary | ICD-10-CM | POA: Diagnosis present

## 2020-11-01 DIAGNOSIS — Z86711 Personal history of pulmonary embolism: Secondary | ICD-10-CM

## 2020-11-01 DIAGNOSIS — R739 Hyperglycemia, unspecified: Secondary | ICD-10-CM | POA: Diagnosis present

## 2020-11-01 DIAGNOSIS — K269 Duodenal ulcer, unspecified as acute or chronic, without hemorrhage or perforation: Secondary | ICD-10-CM | POA: Diagnosis present

## 2020-11-01 DIAGNOSIS — Z86718 Personal history of other venous thrombosis and embolism: Secondary | ICD-10-CM

## 2020-11-01 DIAGNOSIS — Z8249 Family history of ischemic heart disease and other diseases of the circulatory system: Secondary | ICD-10-CM

## 2020-11-01 DIAGNOSIS — K254 Chronic or unspecified gastric ulcer with hemorrhage: Principal | ICD-10-CM | POA: Diagnosis present

## 2020-11-01 DIAGNOSIS — K279 Peptic ulcer, site unspecified, unspecified as acute or chronic, without hemorrhage or perforation: Secondary | ICD-10-CM

## 2020-11-01 DIAGNOSIS — E669 Obesity, unspecified: Secondary | ICD-10-CM | POA: Diagnosis present

## 2020-11-01 DIAGNOSIS — R110 Nausea: Secondary | ICD-10-CM

## 2020-11-01 DIAGNOSIS — Z6832 Body mass index (BMI) 32.0-32.9, adult: Secondary | ICD-10-CM

## 2020-11-01 DIAGNOSIS — Z79899 Other long term (current) drug therapy: Secondary | ICD-10-CM

## 2020-11-01 DIAGNOSIS — Z20822 Contact with and (suspected) exposure to covid-19: Secondary | ICD-10-CM | POA: Diagnosis present

## 2020-11-01 DIAGNOSIS — Z85828 Personal history of other malignant neoplasm of skin: Secondary | ICD-10-CM

## 2020-11-01 DIAGNOSIS — K922 Gastrointestinal hemorrhage, unspecified: Secondary | ICD-10-CM | POA: Diagnosis present

## 2020-11-01 HISTORY — DX: Gastro-esophageal reflux disease without esophagitis: K21.9

## 2020-11-01 LAB — CBC
HCT: 27.7 % — ABNORMAL LOW (ref 36.0–46.0)
Hemoglobin: 8.7 g/dL — ABNORMAL LOW (ref 12.0–15.0)
MCH: 27.3 pg (ref 26.0–34.0)
MCHC: 31.4 g/dL (ref 30.0–36.0)
MCV: 86.8 fL (ref 80.0–100.0)
Platelets: 205 10*3/uL (ref 150–400)
RBC: 3.19 MIL/uL — ABNORMAL LOW (ref 3.87–5.11)
RDW: 16.5 % — ABNORMAL HIGH (ref 11.5–15.5)
WBC: 6.8 10*3/uL (ref 4.0–10.5)
nRBC: 0 % (ref 0.0–0.2)

## 2020-11-01 LAB — COMPREHENSIVE METABOLIC PANEL
ALT: 8 U/L (ref 0–44)
AST: 18 U/L (ref 15–41)
Albumin: 4 g/dL (ref 3.5–5.0)
Alkaline Phosphatase: 46 U/L (ref 38–126)
Anion gap: 10 (ref 5–15)
BUN: 18 mg/dL (ref 8–23)
CO2: 22 mmol/L (ref 22–32)
Calcium: 9 mg/dL (ref 8.9–10.3)
Chloride: 106 mmol/L (ref 98–111)
Creatinine, Ser: 0.87 mg/dL (ref 0.44–1.00)
GFR, Estimated: >60 mL/min (ref >60)
Glucose, Bld: 115 mg/dL — ABNORMAL HIGH (ref 70–99)
Potassium: 3.9 mmol/L (ref 3.5–5.1)
Sodium: 138 mmol/L (ref 135–145)
Total Bilirubin: 0.5 mg/dL (ref 0.3–1.2)
Total Protein: 6.7 g/dL (ref 6.5–8.1)

## 2020-11-01 LAB — PROTIME-INR
INR: 3.7 — ABNORMAL HIGH (ref 0.8–1.2)
Prothrombin Time: 35.8 seconds — ABNORMAL HIGH (ref 11.4–15.2)

## 2020-11-01 LAB — TROPONIN I (HIGH SENSITIVITY): Troponin I (High Sensitivity): 3 ng/L (ref <18)

## 2020-11-01 LAB — POC OCCULT BLOOD, ED: Fecal Occult Bld: POSITIVE — AB

## 2020-11-01 MED ORDER — PANTOPRAZOLE SODIUM 40 MG IV SOLR
40.0000 mg | Freq: Once | INTRAVENOUS | Status: AC
  Administered 2020-11-02: 40 mg via INTRAVENOUS
  Filled 2020-11-01: qty 40

## 2020-11-01 NOTE — ED Triage Notes (Signed)
Pt states that she has had black, tarry stools and abdominal pain since Monday. States she became dizzy today.

## 2020-11-01 NOTE — ED Notes (Signed)
Patient transported to X-ray 
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
## 2020-11-01 NOTE — Telephone Encounter (Signed)
Transition Care Management Follow-up Telephone Call  Date of discharge and from where: 10/30/2020 - [name]  How have you been since you were released from the hospital? "About the same, still in pain"  Any questions or concerns? No  Items Reviewed:  Did the pt receive and understand the discharge instructions provided? Yes   Medications obtained and verified? Yes   Other? No   Any new allergies since your discharge? No   Dietary orders reviewed? Yes  Do you have support at home? Yes   Home Care and Equipment/Supplies: Were home health services ordered? not applicable If so, what is the name of the agency? N/A  Has the agency set up a time to come to the patient's home? not applicable Were any new equipment or medical supplies ordered?  No What is the name of the medical supply agency? N/A Were you able to get the supplies/equipment? not applicable Do you have any questions related to the use of the equipment or supplies? No  Functional Questionnaire: (I = Independent and D = Dependent) ADLs: I  Bathing/Dressing- I  Meal Prep- I  Eating- I  Maintaining continence- I  Transferring/Ambulation- I  Managing Meds- I  Follow up appointments reviewed:   PCP Hospital f/u appt confirmed? Yes  Scheduled to see Dr. Wynetta Emery on 12/09/2020 @ 1410.  [facility] f/u appt confirmed? Yes  Scheduled to see GI on 11/06/2020 @ 1000.  Are transportation arrangements needed? No   If their condition worsens, is the pt aware to call PCP or go to the Emergency Dept.? Yes  Was the patient provided with contact information for the PCP's office or ED? Yes  Was to pt encouraged to call back with questions or concerns? Yes

## 2020-11-02 ENCOUNTER — Encounter (HOSPITAL_COMMUNITY): Payer: Self-pay | Admitting: Internal Medicine

## 2020-11-02 DIAGNOSIS — R739 Hyperglycemia, unspecified: Secondary | ICD-10-CM | POA: Diagnosis present

## 2020-11-02 DIAGNOSIS — K264 Chronic or unspecified duodenal ulcer with hemorrhage: Secondary | ICD-10-CM | POA: Diagnosis present

## 2020-11-02 DIAGNOSIS — Z7901 Long term (current) use of anticoagulants: Secondary | ICD-10-CM | POA: Diagnosis not present

## 2020-11-02 DIAGNOSIS — R1011 Right upper quadrant pain: Secondary | ICD-10-CM

## 2020-11-02 DIAGNOSIS — Z20822 Contact with and (suspected) exposure to covid-19: Secondary | ICD-10-CM | POA: Diagnosis present

## 2020-11-02 DIAGNOSIS — Z86711 Personal history of pulmonary embolism: Secondary | ICD-10-CM

## 2020-11-02 DIAGNOSIS — K253 Acute gastric ulcer without hemorrhage or perforation: Secondary | ICD-10-CM

## 2020-11-02 DIAGNOSIS — K269 Duodenal ulcer, unspecified as acute or chronic, without hemorrhage or perforation: Secondary | ICD-10-CM | POA: Diagnosis not present

## 2020-11-02 DIAGNOSIS — K869 Disease of pancreas, unspecified: Secondary | ICD-10-CM | POA: Diagnosis present

## 2020-11-02 DIAGNOSIS — R110 Nausea: Secondary | ICD-10-CM

## 2020-11-02 DIAGNOSIS — K8689 Other specified diseases of pancreas: Secondary | ICD-10-CM

## 2020-11-02 DIAGNOSIS — E669 Obesity, unspecified: Secondary | ICD-10-CM

## 2020-11-02 DIAGNOSIS — Z83438 Family history of other disorder of lipoprotein metabolism and other lipidemia: Secondary | ICD-10-CM | POA: Diagnosis not present

## 2020-11-02 DIAGNOSIS — K219 Gastro-esophageal reflux disease without esophagitis: Secondary | ICD-10-CM

## 2020-11-02 DIAGNOSIS — K922 Gastrointestinal hemorrhage, unspecified: Secondary | ICD-10-CM | POA: Diagnosis not present

## 2020-11-02 DIAGNOSIS — K254 Chronic or unspecified gastric ulcer with hemorrhage: Secondary | ICD-10-CM | POA: Diagnosis present

## 2020-11-02 DIAGNOSIS — K279 Peptic ulcer, site unspecified, unspecified as acute or chronic, without hemorrhage or perforation: Secondary | ICD-10-CM | POA: Diagnosis not present

## 2020-11-02 DIAGNOSIS — K921 Melena: Secondary | ICD-10-CM | POA: Diagnosis present

## 2020-11-02 DIAGNOSIS — D62 Acute posthemorrhagic anemia: Secondary | ICD-10-CM | POA: Diagnosis present

## 2020-11-02 DIAGNOSIS — Z8249 Family history of ischemic heart disease and other diseases of the circulatory system: Secondary | ICD-10-CM | POA: Diagnosis not present

## 2020-11-02 DIAGNOSIS — I10 Essential (primary) hypertension: Secondary | ICD-10-CM

## 2020-11-02 DIAGNOSIS — Z85828 Personal history of other malignant neoplasm of skin: Secondary | ICD-10-CM | POA: Diagnosis not present

## 2020-11-02 DIAGNOSIS — G8929 Other chronic pain: Secondary | ICD-10-CM | POA: Diagnosis present

## 2020-11-02 DIAGNOSIS — Z6832 Body mass index (BMI) 32.0-32.9, adult: Secondary | ICD-10-CM | POA: Diagnosis not present

## 2020-11-02 DIAGNOSIS — Z86718 Personal history of other venous thrombosis and embolism: Secondary | ICD-10-CM | POA: Diagnosis not present

## 2020-11-02 DIAGNOSIS — Z79899 Other long term (current) drug therapy: Secondary | ICD-10-CM | POA: Diagnosis not present

## 2020-11-02 DIAGNOSIS — R791 Abnormal coagulation profile: Secondary | ICD-10-CM

## 2020-11-02 LAB — RESP PANEL BY RT-PCR (FLU A&B, COVID) ARPGX2
Influenza A by PCR: NEGATIVE
Influenza B by PCR: NEGATIVE
SARS Coronavirus 2 by RT PCR: NEGATIVE

## 2020-11-02 LAB — APTT: aPTT: 46 seconds — ABNORMAL HIGH (ref 24–36)

## 2020-11-02 LAB — PROTIME-INR
INR: 2 — ABNORMAL HIGH (ref 0.8–1.2)
Prothrombin Time: 21.6 seconds — ABNORMAL HIGH (ref 11.4–15.2)

## 2020-11-02 LAB — PHOSPHORUS: Phosphorus: 3.4 mg/dL (ref 2.5–4.6)

## 2020-11-02 LAB — CBC
HCT: 23.5 % — ABNORMAL LOW (ref 36.0–46.0)
Hemoglobin: 7.3 g/dL — ABNORMAL LOW (ref 12.0–15.0)
MCH: 26.8 pg (ref 26.0–34.0)
MCHC: 31.1 g/dL (ref 30.0–36.0)
MCV: 86.4 fL (ref 80.0–100.0)
Platelets: 165 10*3/uL (ref 150–400)
RBC: 2.72 MIL/uL — ABNORMAL LOW (ref 3.87–5.11)
RDW: 16.6 % — ABNORMAL HIGH (ref 11.5–15.5)
WBC: 5.8 10*3/uL (ref 4.0–10.5)
nRBC: 0 % (ref 0.0–0.2)

## 2020-11-02 LAB — COMPREHENSIVE METABOLIC PANEL
ALT: 8 U/L (ref 0–44)
AST: 15 U/L (ref 15–41)
Albumin: 3.2 g/dL — ABNORMAL LOW (ref 3.5–5.0)
Alkaline Phosphatase: 37 U/L — ABNORMAL LOW (ref 38–126)
Anion gap: 6 (ref 5–15)
BUN: 15 mg/dL (ref 8–23)
CO2: 23 mmol/L (ref 22–32)
Calcium: 8.4 mg/dL — ABNORMAL LOW (ref 8.9–10.3)
Chloride: 110 mmol/L (ref 98–111)
Creatinine, Ser: 0.73 mg/dL (ref 0.44–1.00)
GFR, Estimated: >60 mL/min (ref >60)
Glucose, Bld: 99 mg/dL (ref 70–99)
Potassium: 4.1 mmol/L (ref 3.5–5.1)
Sodium: 139 mmol/L (ref 135–145)
Total Bilirubin: 0.3 mg/dL (ref 0.3–1.2)
Total Protein: 5.4 g/dL — ABNORMAL LOW (ref 6.5–8.1)

## 2020-11-02 LAB — MAGNESIUM: Magnesium: 1.8 mg/dL (ref 1.7–2.4)

## 2020-11-02 LAB — TROPONIN I (HIGH SENSITIVITY): Troponin I (High Sensitivity): 3 ng/L (ref <18)

## 2020-11-02 LAB — PREPARE RBC (CROSSMATCH)

## 2020-11-02 MED ORDER — FENTANYL CITRATE (PF) 100 MCG/2ML IJ SOLN
50.0000 ug | Freq: Once | INTRAMUSCULAR | Status: AC
  Administered 2020-11-02: 50 ug via INTRAVENOUS
  Filled 2020-11-02: qty 2

## 2020-11-02 MED ORDER — AMLODIPINE BESYLATE 5 MG PO TABS
2.5000 mg | ORAL_TABLET | Freq: Every day | ORAL | Status: DC
  Administered 2020-11-02 – 2020-11-03 (×2): 2.5 mg via ORAL
  Filled 2020-11-02 (×2): qty 1

## 2020-11-02 MED ORDER — PANTOPRAZOLE SODIUM 40 MG IV SOLR
40.0000 mg | Freq: Two times a day (BID) | INTRAVENOUS | Status: DC
  Administered 2020-11-02 – 2020-11-03 (×4): 40 mg via INTRAVENOUS
  Filled 2020-11-02 (×4): qty 40

## 2020-11-02 MED ORDER — SODIUM CHLORIDE 0.9% IV SOLUTION: Freq: Once | INTRAVENOUS | Status: DC

## 2020-11-02 MED ORDER — MORPHINE SULFATE (PF) 2 MG/ML IV SOLN
2.0000 mg | INTRAVENOUS | Status: DC | PRN
  Administered 2020-11-02 – 2020-11-03 (×5): 2 mg via INTRAVENOUS
  Filled 2020-11-02 (×5): qty 1

## 2020-11-02 MED ORDER — ONDANSETRON HCL 4 MG/2ML IJ SOLN: 4.0000 mg | Freq: Four times a day (QID) | INTRAMUSCULAR | Status: DC | PRN

## 2020-11-02 MED ORDER — SUCRALFATE 1 G PO TABS
1.0000 g | ORAL_TABLET | Freq: Four times a day (QID) | ORAL | Status: DC
  Administered 2020-11-02 – 2020-11-03 (×5): 1 g via ORAL
  Filled 2020-11-02 (×5): qty 1

## 2020-11-02 MED ORDER — FUROSEMIDE 10 MG/ML IJ SOLN
20.0000 mg | Freq: Once | INTRAMUSCULAR | Status: AC
  Administered 2020-11-02: 20 mg via INTRAVENOUS
  Filled 2020-11-02: qty 2

## 2020-11-02 MED ORDER — TRAZODONE HCL 50 MG PO TABS: 50.0000 mg | ORAL_TABLET | Freq: Every evening | ORAL | Status: DC | PRN

## 2020-11-02 MED ORDER — LABETALOL HCL 5 MG/ML IV SOLN: 5.0000 mg | Freq: Four times a day (QID) | INTRAVENOUS | Status: DC | PRN

## 2020-11-02 NOTE — Consult Note (Addendum)
[name], M.D. 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  Patient states he has presented several episodes of melena every day along with worsening of her chronic epigastric abdominal pain.  She described the pain as more severe than usual, now described as a stabbing pain.  Patient reports that her pain radiates to her right upper quadrant.  Denies having any nausea or vomiting and has been eating well.  Has not had any fresh blood in her stool, lightheadedness or dizziness.  States that she has been compliant with her PPI and Carafate, denies taking any NSAIDs or antiplatelet medication but she is still taking her Xarelto 20 mg every day.  Of note, she has more her list of medication the use of topical Voltaren.  She is also taking oral iron supplementation daily.  In the ER, The patient was hemodynamically stable and afebrile.  Labs were remarkable for hemoglobin of 8.7 with MCV 86.  Platelets were normal 205 and white blood cell count was 6.8.  INR was 3.7.  Her BUN was slightly increased to 18 but also her creatinine was slightly higher at 0.87.  Normal electrolytes and liver function tests.  Today, her hemoglobin has dropped down to 7.3 but her BUN has  been stable 15.  Notably, the patient had a recent hospitalization due to similar complaints, Underwent an EGD on 10/18/2020 which showed few erosions in the gastric body, also had a superficial gastric ulcer with clean base in the pylorus and also had a large duodenal ulcer up to 35 mm in the duodenal bulb with presence of one suture crossing the ulcer.  The patient was discharged from the hospital at that time on PPI twice daily and Carafate.  Had gastrin level rechecked but it was negative.  Notably, she had H. pylori serologies which were negative in the past.  Past Medical History: SEE CHRONIC ISSSUES: Past Medical History:  Diagnosis Date  . Collagen vascular disease ([place])   . Essential hypertension, benign 01/24/2015  . Foot ulcer ([place])   . GERD (gastroesophageal reflux disease)   . Hypertension   . Memory loss   . PUD (peptic ulcer disease)    with perforation s/p exploratory laparotomy  . Skin cancer   . Vision abnormalities    Past Surgical History:  Past Surgical History:  Procedure Laterality Date  . CENTRAL LINE INSERTION Right 04/22/2020   Procedure: CENTRAL LINE INSERTION;  Surgeon: Virl Cagey, MD;  Location: [facility];  Service: General;  Laterality: Right;  . DIAGNOSTIC LAPAROSCOPY    . ESOPHAGOGASTRODUODENOSCOPY (EGD) WITH PROPOFOL N/A 07/10/2020   Procedure: ESOPHAGOGASTRODUODENOSCOPY (EGD) WITH PROPOFOL;  Surgeon: Rogene Houston, MD;  Location: [facility];  Service: Endoscopy;  Laterality: N/A;  . ESOPHAGOGASTRODUODENOSCOPY (EGD) WITH PROPOFOL N/A 10/18/2020   Procedure: ESOPHAGOGASTRODUODENOSCOPY (EGD) WITH PROPOFOL;  Surgeon: Harvel Quale, MD;  Location: [facility];  Service: Gastroenterology;  Laterality: N/A;  . GASTRORRHAPHY  04/22/2020   Procedure: GASTRORRHAPHY;  Surgeon: Virl Cagey, MD;  Location: [facility];  Service: General;;  . IR CATHETER TUBE CHANGE  05/09/2020  . LAPAROTOMY N/A 04/22/2020   Procedure: EXPLORATORY LAPAROTOMY;   Surgeon: Virl Cagey, MD;  Location: [facility];  Service: General;  Laterality: N/A;  . TONSILLECTOMY     Family History:  Family History  Problem Relation Age of Onset  . High Cholesterol Mother   . Hypertension Mother   . Arthritis/Rheumatoid Mother   . Alcohol abuse Father   . Colon cancer Neg Hx   . Colon polyps Neg Hx   . Inflammatory bowel disease Neg Hx    Social History:  Social History   Tobacco Use  . Smoking status: Never Smoker  . Smokeless tobacco: Never Used  Vaping Use  . Vaping Use: Never used  Substance Use Topics  . Alcohol use: Yes    Alcohol/week: 1.0 standard drink    Types: 1 Glasses of wine per week    Comment: drank alcohol for past 2 weeks, one glass of wine. Prior to this would drink glass of alcohol on weekends with dinner. 09/05/20: 3 glasses of wine a day  . Drug use: No    Home Medications:  Prior to Admission medications   Medication Sig Start Date End Date Taking? Authorizing Provider  acetaminophen (TYLENOL) 500 MG tablet Take 1 tablet (500 mg total) by mouth every 6 (six) hours as needed for mild pain or fever. Patient taking differently: Take 500 mg by mouth as needed for mild pain or fever. 09/27/20   Johnson, Clanford L, MD  amLODipine (NORVASC) 2.5 MG tablet Take 1 tablet (2.5 mg total) by mouth daily. 10/24/20   Ladell Pier, MD  diclofenac Sodium (VOLTAREN) 1 % GEL Apply 2 g topically 4 (four) times daily. 09/20/20   Ladell Pier, MD  donepezil (ARICEPT) 10 MG tablet Take 0.5-1 tablets (5-10 mg total) by mouth in the morning and at bedtime. 09/20/20   Ladell Pier, MD  ferrous sulfate 325 (65 FE) MG tablet Take 1 tablet (325 mg total) by mouth daily with breakfast. 10/24/20   Ladell Pier, MD  gabapentin (NEURONTIN) 300 MG capsule Take 1 capsule (300 mg total) by mouth 2 (two) times daily. 10/08/20   Ladell Pier, MD  lipase/protease/amylase (CREON) 36000 UNITS CPEP capsule Take 2 capsules (72,000 Units total) by  mouth 3 (three) times daily with meals. May also take 1 capsule (36,000 Units total) as needed (with snacks). 10/16/20   Erenest Rasher, PA-C  mirtazapine (REMERON) 15 MG tablet Take 1 tablet (15 mg total) by mouth at bedtime. 10/08/20   Ladell Pier, MD  ondansetron (ZOFRAN ODT) 8 MG disintegrating tablet Take 1 tablet (8 mg total) by mouth every 8 (eight) hours as needed for nausea or vomiting. 10/16/20   Erenest Rasher, PA-C  oxyCODONE (ROXICODONE) 5 MG immediate release tablet 1 to 2 tablets (5-10 mg) by mouth as needed every 4 hours for pain Patient not taking: Reported on 10/30/2020 10/16/20   Horton, Barbette Hair, MD  oxyCODONE (ROXICODONE) 5 MG immediate release tablet Take 1 tablet (5 mg total) by mouth every 6 (six) hours  as needed for severe pain. 10/30/20   Fredia Sorrow, MD  pantoprazole (PROTONIX) 40 MG tablet Take 1 tablet (40 mg total) by mouth 2 (two) times daily. 09/20/20 10/20/20  Ladell Pier, MD  rivaroxaban (XARELTO) 20 MG TABS tablet Take 1 tablet (20 mg total) by mouth daily with supper. To start after initial loading dose pack. 09/20/20   Ladell Pier, MD  sucralfate (CARAFATE) 1 GM/10ML suspension Take 10 mLs (1 g total) by mouth 4 (four) times daily -  with meals and at bedtime. 09/20/20   Ladell Pier, MD    Inpatient Medications:  Current Facility-Administered Medications:  .  0.9 %  sodium chloride infusion (Manually program via Guardrails IV Fluids), , Intravenous, Once, Emokpae, Courage, MD .  furosemide (LASIX) injection 20 mg, 20 mg, Intravenous, Once, Emokpae, Courage, MD .  labetalol (NORMODYNE) injection 5 mg, 5 mg, Intravenous, Q6H PRN, Adefeso, Oladapo, DO .  morphine 2 MG/ML injection 2 mg, 2 mg, Intravenous, Q4H PRN, Adefeso, Oladapo, DO, 2 mg at 11/02/20 0833 .  ondansetron (ZOFRAN) injection 4 mg, 4 mg, Intravenous, Q6H PRN, Adefeso, Oladapo, DO .  pantoprazole (PROTONIX) injection 40 mg, 40 mg, Intravenous, Q12H, Adefeso, Oladapo, DO, 40  mg at 11/02/20 7673 Allergies: Wheat extract  Complete Review of Systems: GENERAL: negative for malaise, night sweats HEENT: No changes in hearing or vision, no nose bleeds or other nasal problems. NECK: Negative for lumps, goiter, pain and significant neck swelling RESPIRATORY: Negative for cough, wheezing CARDIOVASCULAR: Negative for chest pain, leg swelling, palpitations, orthopnea GI: SEE HPI MUSCULOSKELETAL: Negative for joint pain or swelling, back pain, and muscle pain. SKIN: Negative for lesions, rash PSYCH: Negative for sleep disturbance, mood disorder and recent psychosocial stressors. HEMATOLOGY Negative for prolonged bleeding, bruising easily, and swollen nodes. ENDOCRINE: Negative for cold or heat intolerance, polyuria, polydipsia and goiter. NEURO: negative for tremor, gait imbalance, syncope and seizures. The remainder of the review of systems is noncontributory.  Physical Exam: BP 135/71 (BP Location: Right Arm)   Pulse 68   Temp 98.3 F (36.8 C) (Oral)   Resp 12   Ht $R'5\' 4"'qJ$  (1.626 m)   Wt 85.5 kg   SpO2 97%   BMI 32.35 kg/m  GENERAL: The patient is AO x3, in no acute distress. HEENT: Head is normocephalic and atraumatic. EOMI are intact. Mouth is well hydrated and without lesions. NECK: Supple. No masses LUNGS: Clear to auscultation. No presence of rhonchi/wheezing/rales. Adequate chest expansion HEART: RRR, normal s1 and s2. ABDOMEN: mildly tender to palpation in the epigastric area, no guarding, no peritoneal signs, and nondistended. BS +. No masses. EXTREMITIES: Without any cyanosis, clubbing, rash, lesions or edema. NEUROLOGIC: AOx3, no focal motor deficit. SKIN: no jaundice, no rashes  Laboratory Data CBC:     Component Value Date/Time   WBC 5.8 11/02/2020 0601   RBC 2.72 (L) 11/02/2020 0601   HGB 7.3 (L) 11/02/2020 0601   HCT 23.5 (L) 11/02/2020 0601   PLT 165 11/02/2020 0601   MCV 86.4 11/02/2020 0601   MCH 26.8 11/02/2020 0601   MCHC 31.1  11/02/2020 0601   RDW 16.6 (H) 11/02/2020 0601   LYMPHSABS 1.1 10/30/2020 0814   MONOABS 0.3 10/30/2020 0814   EOSABS 0.1 10/30/2020 0814   BASOSABS 0.0 10/30/2020 0814   COAG:  Lab Results  Component Value Date   INR 2.0 (H) 11/02/2020   INR 3.7 (H) 11/01/2020   INR 1.1 10/19/2020    BMP:  BMP Latest Ref  Rng & Units 11/02/2020 11/01/2020 10/30/2020  Glucose 70 - 99 mg/dL 99 115(H) 104(H)  BUN 8 - 23 mg/dL $Remove'15 18 13  'PJtTfPB$ Creatinine 0.44 - 1.00 mg/dL 0.73 0.87 0.76  Sodium 135 - 145 mmol/L 139 138 137  Potassium 3.5 - 5.1 mmol/L 4.1 3.9 4.0  Chloride 98 - 111 mmol/L 110 106 108  CO2 22 - 32 mmol/L $RemoveB'23 22 22  'kXbPssHc$ Calcium 8.9 - 10.3 mg/dL 8.4(L) 9.0 8.5(L)    HEPATIC:  Hepatic Function Latest Ref Rng & Units 11/02/2020 11/01/2020 10/30/2020  Total Protein 6.5 - 8.1 g/dL 5.4(L) 6.7 6.2(L)  Albumin 3.5 - 5.0 g/dL 3.2(L) 4.0 3.8  AST 15 - 41 U/L $Remo'15 18 17  'qaHkV$ ALT 0 - 44 U/L $Remo'8 8 8  'hckmp$ Alk Phosphatase 38 - 126 U/L 37(L) 46 46  Total Bilirubin 0.3 - 1.2 mg/dL 0.3 0.5 0.5    CARDIAC:  Lab Results  Component Value Date   CKTOTAL 75 03/26/2020     Imaging: I personally reviewed and interpreted the available imaging.  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  Patient has presented recurrent bleeding clinically as she has presented more episodes of melena with significant drop in her hemoglobin.  She has had recent endoscopic investigations that have shown presence of persistent ulceration in the duodenum.  I do suspect that she is using topical NSAIDs which may  worsen her current condition, but other conditions such as upper gastrointestinal Crohn's disease should be considered in the differential given size and refractoriness of the ulcer.  H. pylori serology was negative but other tests have not been performed as she has been on PPI since her initial presentation.  She will need to have a repeat endoscopic evaluation at some point to assess wound healing and also obtain biopsies from the edges of the duodenal ulcer but not emergently unless she presents any further drop in her hemoglobin.  At this point, I would recommend transfusing her 1 unit of PRBC we will assess her response clinical status in the morning to determine if she needs an EGD tomorrow.  She should continue with IV PPI twice daily and Carafate 1 g every 6 hours.  Also, should consider switching to a a stronger PPI in the future such as Dexilant or Zegerid if the patient can afford.  He should avoid using any NSAIDs, even the topical ones.  More importantly, I consider that discussion with the patient regarding definite discontinuation of Xarelto should be held as she has almost finished the timeframe that she should be taking this medication and it is clear that this has increased her risk of gastrointestinal bleeding.  #Upper gastrointestinal bleeding #Large duodenal ulcer #History of NSAID use - Transfuse 1 UPRBC and repeat CBC q12h, transfuse if Hb <7 - Hold Xarelto now and discuss possibility of definitive discontinuation - Pantoprazole 40 mg q12h IVP - Sucralfate 1 g every 6 hours - 2 large bore IV lines - Active T/S - Keep NPO after MN - Avoid NSAIDs, even topical - Will assess need for EGD tomorrow - Conisder switching to high dose Dexilant or zegerid as outpatient  Harvel Quale,  MD Gastroenterology and Hepatology [facility]

## 2020-11-02 NOTE — Progress Notes (Addendum)
Patient Demographics:    Maria Murphy, is a 65 y.o. female, DOB - 05-17-1956, HLK:[id number]  Admit date - 11/01/2020   Admitting Physician Bernadette Hoit, DO  Outpatient Primary MD for the patient is Ladell Pier, MD  LOS - 0   Chief Complaint  Patient presents with  . Rectal Bleeding        Subjective:    Maria Murphy today has no fevers, no emesis,  No chest pain,   -No further BM -Abdominal pain is not worse  -Total care time over 38 minutes  Assessment  & Plan :    Principal Problem:   GI bleed Active Problems:   Essential hypertension   Abdominal pain   Duodenal ulcer   Pancreatic mass   History of pulmonary embolus (PE)   Obesity (BMI 30-39.9)   GERD (gastroesophageal reflux disease)   Gastric ulcer   Supratherapeutic INR   Nausea  Brief Summary:- 65 y.o. female with medical history significant for PUD/duodenal ulcers,prior PEs on Xarelto, hypertension, GERD, obesity, and cholelithiasis admitted on 11/02/2020 with black tarry stools and drop in hemoglobin while on Xarelto  A/p 1) acute GI bleed----while on Xarelto -Continue to hold Xarelto -Give IV Protonix and p.o. Carafate -GI plans EGD for 11/03/2020 -Patient previously Underwent an EGD on 10/18/2020 which showed few erosions in the gastric body, also had a superficial gastric ulcer with clean base in the pylorus and also had a large duodenal ulcer up to 35 mm in the duodenal bulb with presence of one suture crossing the ulcer  2)PUD/pancreatic mass/abdominal pain-prior EGD from 10/18/2020 as noted above #1 --EUS upcoming 11/20/20 by Dr. Rush Landmark -As needed opiates for pain control -IV Protonix and p.o. Carafate as above #1 -Ongoing outpatient follow-up with GI for pancreatic mass   3) acute on chronic anemia--worsening anemia due to ABLA, management as above in #1 -Transfuse 1 unit of PRBC for symptomatic  anemia (patient with fatigue, dyspnea on exertion and weakness)  4)HTN--restart amlodipine May use IV labetalol as needed with a BP  5)H/o PE--continue to hold Xarelto due to #1 above  6)Inflamed gallbladder appearance -09/18/2020 HIDA scan normal -10/18/20 RUQ US--Cholelithiasis and mild gallbladder wall thickening. -Recently evaluated by general surgery and operative intervention deferred  -Total care time over 38 minutes  Disposition/Need for in-Hospital Stay- patient unable to be discharged at this time due to -acute GI bleed with symptomatic anemia requiring transfusion of PRBC, IV Protonix and endoluminal evaluation    Disposition: The patient is from: Home              Anticipated d/c is to: Home              Anticipated d/c date is: 2 days              Patient currently is not medically stable to d/c. Barriers: Not Clinically Stable-   Code Status :  -  Code Status: Full Code   Family Communication:    NA (patient is alert, awake and coherent)   Consults  :  Gi  DVT Prophylaxis  :   - SCDs   SCDs Start: 11/02/20 0216   Lab Results  Component Value Date   PLT 165 11/02/2020  Inpatient Medications  Scheduled Meds: . sodium chloride   Intravenous Once  . amLODipine  2.5 mg Oral Daily  . furosemide  20 mg Intravenous Once  . pantoprazole (PROTONIX) IV  40 mg Intravenous Q12H  . sucralfate  1 g Oral Q6H   Continuous Infusions: PRN Meds:.labetalol, morphine injection, ondansetron (ZOFRAN) IV    Anti-infectives (From admission, onward)   None        Objective:   Vitals:   11/02/20 0216 11/02/20 0542 11/02/20 0951 11/02/20 1013  BP:  132/62 (!) 143/78 135/71  Pulse:  67 64 68  Resp:  (!) 22 12 12   Temp:  98.5 F (36.9 C) 98.6 F (37 C) 98.3 F (36.8 C)  TempSrc:  Oral Oral Oral  SpO2: 100% 98% 96% 97%  Weight:      Height:        Wt Readings from Last 3 Encounters:  11/01/20 85.5 kg  10/24/20 85.5 kg  10/18/20 80.7 kg     Intake/Output  Summary (Last 24 hours) at 11/02/2020 1115 Last data filed at 11/02/2020 0900 Gross per 24 hour  Intake 240 ml  Output --  Net 240 ml     Physical Exam  Gen:- Awake Alert,  In no apparent distress  HEENT:- [place].AT, No sclera icterus Neck-Supple Neck,No JVD,.  Lungs-  CTAB , fair symmetrical air movement CV- S1, S2 normal, regular  Abd-  +ve B.Sounds, Abd Soft, right upper quadrant and epigastric area tenderness,   no rebound or guarding Extremity/Skin:- No  edema, pedal pulses present  Psych-affect is appropriate, oriented x3 Neuro-generalized weakness, no new focal deficits, no tremors   Data Review:   Micro Results Recent Results (from the past 240 hour(s))  Resp Panel by RT-PCR (Flu A&B, Covid) Nasopharyngeal Swab     Status: None   Collection Time: 11/02/20 12:08 AM   Specimen: Nasopharyngeal Swab; Nasopharyngeal(NP) swabs in vial transport medium  Result Value Ref Range Status   SARS Coronavirus 2 by RT PCR NEGATIVE NEGATIVE Final    Comment: (NOTE) SARS-CoV-2 target nucleic acids are NOT DETECTED.  The SARS-CoV-2 RNA is generally detectable in upper respiratory specimens during the acute phase of infection. The lowest concentration of SARS-CoV-2 viral copies this assay can detect is 138 copies/mL. A negative result does not preclude SARS-Cov-2 infection and should not be used as the sole basis for treatment or other patient management decisions. A negative result may occur with  improper specimen collection/handling, submission of specimen other than nasopharyngeal swab, presence of viral mutation(s) within the areas targeted by this assay, and inadequate number of viral copies(<138 copies/mL). A negative result must be combined with clinical observations, patient history, and epidemiological information. The expected result is Negative.  Fact Sheet for Patients:  EntrepreneurPulse.com.au  Fact Sheet for Healthcare Providers:   IncredibleEmployment.be  This test is no t yet approved or cleared by the [place] FDA and  has been authorized for detection and/or diagnosis of SARS-CoV-2 by FDA under an Emergency Use Authorization (EUA). This EUA will remain  in effect (meaning this test can be used) for the duration of the COVID-19 declaration under Section 564(b)(1) of the Act, 21 U.S.C.section 360bbb-3(b)(1), unless the authorization is terminated  or revoked sooner.       Influenza A by PCR NEGATIVE NEGATIVE Final   Influenza B by PCR NEGATIVE NEGATIVE Final    Comment: (NOTE) The Xpert Xpress SARS-CoV-2/FLU/RSV plus assay is intended as an aid in the diagnosis of influenza from  Nasopharyngeal swab specimens and should not be used as a sole basis for treatment. Nasal washings and aspirates are unacceptable for Xpert Xpress SARS-CoV-2/FLU/RSV testing.  Fact Sheet for Patients: EntrepreneurPulse.com.au  Fact Sheet for Healthcare Providers: IncredibleEmployment.be  This test is not yet approved or cleared by the [place] FDA and has been authorized for detection and/or diagnosis of SARS-CoV-2 by FDA under an Emergency Use Authorization (EUA). This EUA will remain in effect (meaning this test can be used) for the duration of the COVID-19 declaration under Section 564(b)(1) of the Act, 21 U.S.C. section 360bbb-3(b)(1), unless the authorization is terminated or revoked.  Performed at [facility], [address] [place], [place] [address]     Radiology Reports CT ABDOMEN PELVIS W CONTRAST  Result Date: 10/15/2020 CLINICAL DATA:  65 year old female with abdominal pain for 6 days. History of exploratory laparotomy, perforated gastric ulcer in August 2021. EXAM: CT ABDOMEN AND PELVIS WITH CONTRAST TECHNIQUE: Multidetector CT imaging of the abdomen and pelvis was performed using the standard protocol following bolus administration of intravenous  contrast. CONTRAST:  165mL OMNIPAQUE IOHEXOL 300 MG/ML  SOLN COMPARISON:  CT Abdomen and Pelvis 09/24/2020 and earlier, including MRI 04/22/2020. FINDINGS: Lower chest: Stable lung bases and cardiac size at the upper limits of normal. No pericardial or pleural effusion. Hepatobiliary: Chronic cholelithiasis with gallstones individually up to 19 mm. Fat at the porta hepatis and the gallbladder wall remain indistinct similar to earlier this month. No progressive gallbladder wall thickening. No intrahepatic biliary ductal dilatation. Stable liver enhancement including tiny low-density area near the dome on the right which has a benign appearance. No perihepatic free fluid. Pancreas: Multi cystic, partially calcified mass of the pancreatic head measuring up to 43 mm long axis (series 2, image 29) appears larger since August 2021 by up to 10 mm. Main pancreatic duct also appears more prominent since that time. But there is no convincing primary pancreatic inflammation. No free fluid in the lesser sac. Spleen: Negative. Adrenals/Urinary Tract: Normal adrenal glands. Both kidneys are stable since August including mildly complex partially calcified exophytic right upper pole cystic lesion - which showed no suspicious enhancement on August MRI. Superimposed benign appearing parapelvic and left renal midpole cysts. Normal renal contrast excretion on delayed images. No perinephric stranding. No definite nephrolithiasis. Decompressed ureters. Negative urinary bladder. Chronic pelvic phleboliths. Stomach/Bowel: Negative rectum. Extensive sigmoid diverticulosis with no active inflammation. Redundant transverse colon. Retained stool in the transverse and right colon. Cecum is on a lax mesentery located in the right mid abdomen. Normal appendix visible on series 5, image 30. No large bowel inflammation. Negative terminal ileum. No dilated small bowel. Small gastric hiatal hernia. No free air. No free fluid identified. But there  remains an indistinct appearance of the gastric antrum and proximal duodenum (series 2, image 24 and series 5, image 24) with increased mesenteric stranding from earlier this month. Regional mucosal hyperenhancement and wall thickening. No extraluminal gas identified. Vascular/Lymphatic: Major arterial structures in the abdomen and pelvis are patent and mildly tortuous. Mild Aortoiliac calcified atherosclerosis. Portal venous system is patent. No lymphadenopathy. Reproductive: Lobulated, fibroid uterus including multiple exophytic, subserosal fibroids anteriorly and at the fundus which are individually up to 37 mm diameter and not significantly changed since 04/20/2020. Adnexa appear stable and within normal limits. Other: No pelvic free fluid. Chronic postoperative changes to the ventral upper abdomen are stable. Musculoskeletal: Intermittent advanced disc and endplate degeneration in the visible spine. Grade 1 lumbar spondylolisthesis. No acute osseous abnormality identified.  IMPRESSION: 1. 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 This was evaluated by MRI in August 2021 (please see that report), and appears mildly larger (4.3 cm) with increased conspicuity of the pancreatic duct since that time. 2. Continued inflamed appearance of the distal stomach and proximal duodenum compatible with Recurrent Acute Peptic Ulcer Disease. Adjacent mesenteric fat stranding seems increased since 09/24/2020, although there is no evidence of perforation. 3. Persistent mildly inflamed appearance of the gallbladder and porta hepatis region also, with underlying Cholelithiasis. 4. Otherwise stable abdomen and pelvis, including renal cysts which had a benign MRI appearance, sigmoid diverticulosis, fibroid uterus. Electronically Signed   By: Genevie Ann M.D.   On: 10/15/2020 04:42   US Abdomen Limited  Result Date: 10/18/2020 CLINICAL DATA:  Abdominal pain.  History of gallbladder stones. EXAM: ULTRASOUND  ABDOMEN LIMITED RIGHT UPPER QUADRANT COMPARISON:  CT of the abdomen and pelvis on 10/15/2020 and ultrasound of the abdomen 09/24/2020 FINDINGS: Gallbladder: Gallbladder wall is mildly thickened, 3.6 millimeters. There are numerous stones within the gallbladder, measuring up to 1.5 centimeters. No pericholecystic fluid. Note is made of a positive sonographic Murphy's sign. Common bile duct: Diameter: 5.8 millimeters Liver: Focal areas of increased attenuation particularly within the gallbladder fossa, consistent with focal fatty infiltration. No focal liver lesions are identified. Portal vein is patent on color Doppler imaging with normal direction of blood flow towards the liver. Other: None. IMPRESSION: 1. Cholelithiasis and mild gallbladder wall thickening. 2. Sonographic Murphy's sign is present today, new since the previous ultrasound exam. Consider acute cholecystitis. 3. There is no pericholecystic fluid. 4. Focal fatty infiltration of the liver. These results will be called to the ordering clinician or representative by the Radiologist Assistant, and communication documented in the PACS or [employer]. Electronically Signed   By: Nolon Nations M.D.   On: 10/18/2020 10:22   DG Abdomen Acute W/Chest  Result Date: 11/01/2020 CLINICAL DATA:  Abdominal pain with black, tarry stools. EXAM: DG ABDOMEN ACUTE WITH 1 VIEW CHEST COMPARISON:  October 18, 2020 FINDINGS: There is no evidence of dilated bowel loops or free intraperitoneal air. No radiopaque calculi are seen. A stable 1.1 cm soft tissue calcification is seen overlying the medial aspect of the mid to upper right abdomen. Heart size and mediastinal contours are within normal limits. Chronic appearing increased lung markings are seen without evidence of acute infiltrate. IMPRESSION: 1. No evidence of bowel obstruction or acute cardiopulmonary disease. 2. Stable soft tissue calcification within the abdomen which corresponds to the partially  calcified pancreatic mass seen on prior studies. Electronically Signed   By: Virgina Norfolk M.D.   On: 11/01/2020 23:55   DG Abdomen Acute W/Chest  Result Date: 10/18/2020 CLINICAL DATA:  Abdominal pain post surgery for perirectal abscess EXAM: DG ABDOMEN ACUTE WITH 1 VIEW CHEST COMPARISON:  Radiograph 10/16/2020, CT 10/15/2020 FINDINGS: Streaky basilar opacities and low volumes favoring atelectasis. No consolidation, features of edema, pneumothorax, or effusion. Pulmonary vascularity is normally distributed. The cardiomediastinal contours are unremarkable. No subdiaphragmatic free air. Redemonstrated calcification at the level of the pancreatic head corresponding to a lesion on comparison CT. There is increasing air distention of the stomach and mid abdominal small bowel with a moderate to large colonic stool burden. Few calcified gallstones are less well visualized on this exam. No acute osseous abnormality. Degenerative changes in the shoulders, spine, hips and pelvis. The osseous structures appear diffusely demineralized which may limit detection of small or nondisplaced fractures. No worrisome findings of the  remaining soft tissues. IMPRESSION: 1. Moderate to large colonic stool burden with increasing air distention of the stomach and mid abdominal small bowel possibly related to constipation though could reflect an early or developing ileus in the appropriate clinical setting. No free air. 2. Calcification of the level of the pancreatic head corresponding to a pancreatic mass on comparison CT. 3. Lung volumes are low with streaky basilar opacities favoring atelectasis. Electronically Signed   By: Lovena Le M.D.   On: 10/18/2020 04:09   DG Abdomen Acute W/Chest  Result Date: 10/16/2020 CLINICAL DATA:  Abdominal pain, change EXAM: DG ABDOMEN ACUTE WITH 1 VIEW CHEST COMPARISON:  CT 10/15/2020, ultrasound 09/24/2020, radiograph 09/24/2020 FINDINGS: Some mildly coarsened interstitial changes are  similar to prior with basilar atelectasis. No consolidation, features of edema, pneumothorax, or effusion. Unchanged cardiomediastinal contours. No subdiaphragmatic free air. No high-grade obstructive bowel gas pattern. Moderate colonic stool burden. Redemonstration of the calcification at the level of the pancreatic head associated with a cystic pancreatic neoplasm on comparison CT imaging. Few calcified gallstones are noted as well. Vascular calcium of the abdomen and pelvis. Few phleboliths in the deep pelvis. Extensive degenerative changes in the shoulders, spine, hips and pelvis. The osseous structures appear diffusely demineralized which may limit detection of small or nondisplaced fractures. No acute osseous abnormality or suspicious osseous lesion. IMPRESSION: 1. 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 No acute cardiopulmonary abnormality. 2. Nonobstructive bowel gas pattern. Moderate colonic stool burden. 3. Calcification associated with the pancreatic lesion on comparison CT. Partly visualized gallstones as well. Electronically Signed   By: Lovena Le M.D.   On: 10/16/2020 05:26     CBC Recent Labs  Lab 10/30/20 0814 11/01/20 2222 11/02/20 0601  WBC 5.1 6.8 5.8  HGB 9.3* 8.7* 7.3*  HCT 28.6* 27.7* 23.5*  PLT 178 205 165  MCV 85.6 86.8 86.4  MCH 27.8 27.3 26.8  MCHC 32.5 31.4 31.1  RDW 15.8* 16.5* 16.6*  LYMPHSABS 1.1  --   --   MONOABS 0.3  --   --   EOSABS 0.1  --   --   BASOSABS 0.0  --   --     Chemistries  Recent Labs  Lab 10/30/20 0814 11/01/20 2222 11/02/20 0601  NA 137 138 139  K 4.0 3.9 4.1  CL 108 106 110  CO2 22 22 23   GLUCOSE 104* 115* 99  BUN 13 18 15   CREATININE 0.76 0.87 0.73  CALCIUM 8.5* 9.0 8.4*  MG  --   --  1.8  AST 17 18 15   ALT 8 8 8   ALKPHOS 46 46 37*  BILITOT 0.5 0.5 0.3   ------------------------------------------------------------------------------------------------------------------ No results for  input(s): CHOL, HDL, LDLCALC, TRIG, CHOLHDL, LDLDIRECT in the last 72 hours.  No results found for: HGBA1C ------------------------------------------------------------------------------------------------------------------ No results for input(s): TSH, T4TOTAL, T3FREE, THYROIDAB in the last 72 hours.  Invalid input(s): FREET3 ------------------------------------------------------------------------------------------------------------------ No results for input(s): VITAMINB12, FOLATE, FERRITIN, TIBC, IRON, RETICCTPCT in the last 72 hours.  Coagulation profile Recent Labs  Lab 11/01/20 2222 11/02/20 0601  INR 3.7* 2.0*    No results for input(s): DDIMER in the last 72 hours.  Cardiac Enzymes No results for input(s): CKMB, TROPONINI, MYOGLOBIN in the last 168 hours.  Invalid input(s): CK ------------------------------------------------------------------------------------------------------------------ No results found for: BNP   Roxan Hockey M.D on 11/02/2020 at 11:15 AM  Go to [email or web address] - for contact info  [facility] - Office  [phone]

## 2020-11-02 NOTE — H&P (Signed)
History and Physical  Maria Murphy PTW:[id number] DOB: Nov 01, 1955 DOA: 11/01/2020  Referring physician: Ezequiel Essex, MD PCP: Ladell Pier, MD  Patient coming from: Home  Chief Complaint: Rectal bleeding  HPI: Maria Murphy is a 65 y.o. female with medical history significant for PUD/duodenal ulcers,prior PEs on Xarelto, hypertension, GERD, obesity, and cholelithiasis who presented to the ED due to 2-day onset of worsening abdominal pain, black tarry stools which was associated with lightheadedness and dizziness.  Patient was diagnosed with pancreatic mass and has been having several months of ongoing right upper quadrant pain.  She takes oxycodone at home without relief and she presented to the ED 2 days ago for medication refill dizziness which worsens on standing.  Patient denies chest pain, shortness of breath, nausea, vomiting, fever or chills.  Patient is still on Xarelto per ED medical record She was admitted here from 2/25-3/2 due to GI bleed during which EGD done showed nonbleeding gastric ulcers; large duodenal ulcer in the duodenal bulb, close to 35 mm in diameter;no presence of stigmata of recent bleeding but there was presence of a suture at the bed of the ulcer which was semicircumferential.  ED Course:  In the emergency department, she was hemodynamically stable scratch that.  Work-up in the ED showed normocytic anemia and normal CBC except for mild hyperglycemia.  FOBT was positive, troponin x2 was flat at 3.  INR was elevated at 3.7. Abdominal x-ray with 1 view chest done showed no evidence of bowel obstruction no acute cardiopulmonary disease. Pain medication was given, Protonix was given. Gastroenterologist (Dr. Jenetta Downer) was consulted and recommended observing patient overnight, trend hemoglobin and hold Xarelto per ED medical record  Review of Systems:  Constitutional: Positive for fatigue.  Negative for chills and fever.  HENT: Negative for ear pain and sore  throat.   Eyes: Negative for pain and visual disturbance.  Respiratory: Negative for cough, chest tightness and shortness of breath.   Cardiovascular: Negative for chest pain and palpitations.  Gastrointestinal: [name] for abdominal pain and and blood in stool.  Negative for vomiting.  Endocrine: Negative for polyphagia and polyuria.  Genitourinary: Negative for decreased urine volume, dysuria, enuresis Musculoskeletal: Negative for arthralgias and back pain.  Skin: Negative for color change and rash.  Allergic/Immunologic: Negative for immunocompromised state.  Neurological: Positive for lightheadedness and dizziness.  Negative for tremors, syncope, speech difficulty Hematological: Does not bruise/bleed easily.  All other systems reviewed and are negative  Past Medical History:  Diagnosis Date  . Collagen vascular disease ([place])   . Essential hypertension, benign 01/24/2015  . Foot ulcer ([place])   . GERD (gastroesophageal reflux disease)   . Hypertension   . Memory loss   . PUD (peptic ulcer disease)    with perforation s/p exploratory laparotomy  . Skin cancer   . Vision abnormalities    Past Surgical History:  Procedure Laterality Date  . CENTRAL LINE INSERTION Right 04/22/2020   Procedure: CENTRAL LINE INSERTION;  Surgeon: Virl Cagey, MD;  Location: [facility];  Service: General;  Laterality: Right;  . DIAGNOSTIC LAPAROSCOPY    . ESOPHAGOGASTRODUODENOSCOPY (EGD) WITH PROPOFOL N/A 07/10/2020   Procedure: ESOPHAGOGASTRODUODENOSCOPY (EGD) WITH PROPOFOL;  Surgeon: Rogene Houston, MD;  Location: [facility];  Service: Endoscopy;  Laterality: N/A;  . ESOPHAGOGASTRODUODENOSCOPY (EGD) WITH PROPOFOL N/A 10/18/2020   Procedure: ESOPHAGOGASTRODUODENOSCOPY (EGD) WITH PROPOFOL;  Surgeon: Harvel Quale, MD;  Location: [facility];  Service: Gastroenterology;  Laterality: N/A;  . GASTRORRHAPHY  04/22/2020  Procedure: GASTRORRHAPHY;  Surgeon: Virl Cagey, MD;  Location:  [facility];  Service: General;;  . IR CATHETER TUBE CHANGE  05/09/2020  . LAPAROTOMY N/A 04/22/2020   Procedure: EXPLORATORY LAPAROTOMY;  Surgeon: Virl Cagey, MD;  Location: [facility];  Service: General;  Laterality: N/A;  . TONSILLECTOMY      Social History:  reports that she has never smoked. She has never used smokeless tobacco. She reports current alcohol use of about 1.0 standard drink of alcohol per week. She reports that she does not use drugs.   Allergies  Allergen Reactions  . Wheat Extract Swelling    Family History  Problem Relation Age of Onset  . High Cholesterol Mother   . Hypertension Mother   . Arthritis/Rheumatoid Mother   . Alcohol abuse Father   . Colon cancer Neg Hx   . Colon polyps Neg Hx   . Inflammatory bowel disease Neg Hx     Prior to Admission medications   Medication Sig Start Date End Date Taking? Authorizing Provider  acetaminophen (TYLENOL) 500 MG tablet Take 1 tablet (500 mg total) by mouth every 6 (six) hours as needed for mild pain or fever. Patient taking differently: Take 500 mg by mouth as needed for mild pain or fever. 09/27/20   Johnson, Clanford L, MD  amLODipine (NORVASC) 2.5 MG tablet Take 1 tablet (2.5 mg total) by mouth daily. 10/24/20   Ladell Pier, MD  diclofenac Sodium (VOLTAREN) 1 % GEL Apply 2 g topically 4 (four) times daily. 09/20/20   Ladell Pier, MD  donepezil (ARICEPT) 10 MG tablet Take 0.5-1 tablets (5-10 mg total) by mouth in the morning and at bedtime. 09/20/20   Ladell Pier, MD  ferrous sulfate 325 (65 FE) MG tablet Take 1 tablet (325 mg total) by mouth daily with breakfast. 10/24/20   Ladell Pier, MD  gabapentin (NEURONTIN) 300 MG capsule Take 1 capsule (300 mg total) by mouth 2 (two) times daily. 10/08/20   Ladell Pier, MD  lipase/protease/amylase (CREON) 36000 UNITS CPEP capsule Take 2 capsules (72,000 Units total) by mouth 3 (three) times daily with meals. May also take 1 capsule (36,000 Units  total) as needed (with snacks). 10/16/20   Erenest Rasher, PA-C  mirtazapine (REMERON) 15 MG tablet Take 1 tablet (15 mg total) by mouth at bedtime. 10/08/20   Ladell Pier, MD  ondansetron (ZOFRAN ODT) 8 MG disintegrating tablet Take 1 tablet (8 mg total) by mouth every 8 (eight) hours as needed for nausea or vomiting. 10/16/20   Erenest Rasher, PA-C  oxyCODONE (ROXICODONE) 5 MG immediate release tablet 1 to 2 tablets (5-10 mg) by mouth as needed every 4 hours for pain Patient not taking: Reported on 10/30/2020 10/16/20   Horton, Barbette Hair, MD  oxyCODONE (ROXICODONE) 5 MG immediate release tablet Take 1 tablet (5 mg total) by mouth every 6 (six) hours as needed for severe pain. 10/30/20   Fredia Sorrow, MD  pantoprazole (PROTONIX) 40 MG tablet Take 1 tablet (40 mg total) by mouth 2 (two) times daily. 09/20/20 10/20/20  Ladell Pier, MD  rivaroxaban (XARELTO) 20 MG TABS tablet Take 1 tablet (20 mg total) by mouth daily with supper. To start after initial loading dose pack. 09/20/20   Ladell Pier, MD  sucralfate (CARAFATE) 1 GM/10ML suspension Take 10 mLs (1 g total) by mouth 4 (four) times daily -  with meals and at bedtime. 09/20/20   Ladell Pier,  MD    Physical Exam: BP (!) 161/88 (BP Location: Right Arm)   Pulse 68   Temp 97.6 F (36.4 C)   Resp 16   Ht 5\' 4"  (1.626 m)   Wt 85.5 kg   SpO2 100%   BMI 32.35 kg/m   . General: 65 y.o. year-old female well developed well nourished in no acute distress.  Alert and oriented x3. Marland Kitchen HEENT: NCAT, EOMI . Neck: Supple, trachea medial . Cardiovascular: Regular rate and rhythm with no rubs or gallops.  No thyromegaly or JVD noted.  2/4 pulses in all 4 extremities. Marland Kitchen Respiratory: Clear to auscultation with no wheezes or rales. Good inspiratory effort. . Abdomen: Soft, tender to palpation of epigastric and RUQ with guarding. Normal bowel sounds x4 quadrants. . Muskuloskeletal: No cyanosis, clubbing or edema noted  bilaterally . Neuro: CN II-XII intact, sensation, reflexes intact.  No focal neurological deficit . Skin: No ulcerative lesions noted or rashes . Psychiatry: Judgement and insight appear normal. Mood is appropriate for condition and setting          Labs on Admission:  Basic Metabolic Panel: Recent Labs  Lab 10/30/20 0814 11/01/20 2222  NA 137 138  K 4.0 3.9  CL 108 106  CO2 22 22  GLUCOSE 104* 115*  BUN 13 18  CREATININE 0.76 0.87  CALCIUM 8.5* 9.0   Liver Function Tests: Recent Labs  Lab 10/30/20 0814 11/01/20 2222  AST 17 18  ALT 8 8  ALKPHOS 46 46  BILITOT 0.5 0.5  PROT 6.2* 6.7  ALBUMIN 3.8 4.0   Recent Labs  Lab 10/30/20 0814  LIPASE 26   No results for input(s): AMMONIA in the last 168 hours. CBC: Recent Labs  Lab 10/30/20 0814 11/01/20 2222  WBC 5.1 6.8  NEUTROABS 3.5  --   HGB 9.3* 8.7*  HCT 28.6* 27.7*  MCV 85.6 86.8  PLT 178 205   Cardiac Enzymes: No results for input(s): CKTOTAL, CKMB, CKMBINDEX, TROPONINI in the last 168 hours.  BNP (last 3 results) No results for input(s): BNP in the last 8760 hours.  ProBNP (last 3 results) No results for input(s): PROBNP in the last 8760 hours.  CBG: No results for input(s): GLUCAP in the last 168 hours.  Radiological Exams on Admission: DG Abdomen Acute W/Chest  Result Date: 11/01/2020 CLINICAL DATA:  Abdominal pain with black, tarry stools. EXAM: DG ABDOMEN ACUTE WITH 1 VIEW CHEST COMPARISON:  October 18, 2020 FINDINGS: There is no evidence of dilated bowel loops or free intraperitoneal air. No radiopaque calculi are seen. A stable 1.1 cm soft tissue calcification is seen overlying the medial aspect of the mid to upper right abdomen. Heart size and mediastinal contours are within normal limits. Chronic appearing increased lung markings are seen without evidence of acute infiltrate. IMPRESSION: 1. No evidence of bowel obstruction or acute cardiopulmonary disease. 2. Stable soft tissue calcification  within the abdomen which corresponds to the partially calcified pancreatic mass seen on prior studies. Electronically Signed   By: Virgina Norfolk M.D.   On: 11/01/2020 23:55    EKG: I independently viewed the EKG done and my findings are as followed: Normal sinus rhythm at a rate of 76 bpm   Assessment/Plan Present on Admission: . GI bleed . Abdominal pain . Obesity (BMI 30-39.9) . Pancreatic mass . GERD (gastroesophageal reflux disease) . History of pulmonary embolus (PE) . Essential hypertension . Duodenal ulcer . Gastric ulcer  Principal Problem:   GI bleed Active  Problems:   Essential hypertension   Abdominal pain   Duodenal ulcer   Pancreatic mass   History of pulmonary embolus (PE)   Obesity (BMI 30-39.9)   GERD (gastroesophageal reflux disease)   Gastric ulcer   Supratherapeutic INR   Nausea  GI bleed H/H= 8.7/27.7, this was 9.3/28.6 on 10/01/2020 Hemoccult was positive Continue IV Protonix 40 twice daily Monitor for worsening hemoglobin level Gastroenterologist (Dr. Jenetta Downer) was consulted by ED physician and recommended observing patient overnight, trending hemoglobin and holding Xarelto.  Patient will be consulted in the morning per ED physician  Abdominal pain in the setting of history of duodenal ulcer/gastric ulcer EGD done during last admission in February of this year showed nonbleeding gastric ulcers; large duodenal ulcer in the duodenal bulb, close to 35 mm in diameter Continue IV Protonix Continue IV morphine 2 mg every 4 hours as needed  Nausea  Continue IV Zofran as needed  Supratherapeutic INR INR 3.7, patient was not on warfarin Continue to hold Xarelto at this time Continue to monitor INR  Pancreatic mass CT abdomen pelvis with contrast done on 2/22 showed appearance suspicious for slowly enlarging cystic pancreatic neoplasm. This was evaluated by MRI in August 2021 (please see that report), and appears mildly larger (4.3 cm) with  increased conspicuity of the pancreatic duct since that time. Patient follows with gastroenterologist  Essential hypertension (uncontrolled) Continue IV labetalol as needed Hold home medications fornow  History of PE Xarelto will be held at this time due to GI bleed  Obesity (BMI 32.35) Patient will be counseled on diet and lifestyle modification when  more stable  GERD Continue IV Protonix  DVT prophylaxis: SCDs  Code Status: Full code  Family Communication: None at bedside  Disposition Plan:  Patient is from:                        home Anticipated DC to:                   home Anticipated DC date:                1 day Anticipated DC barriers:         patient is unstable to be discharged at this time due to GI bleed abdominal pain and weakness requiring monitoring for decreasing hemoglobin  Consults called: Gastroenterology  Admission status: Observation  Bernadette Hoit MD [facility]  11/02/2020, 2:58 AM

## 2020-11-03 DIAGNOSIS — K922 Gastrointestinal hemorrhage, unspecified: Secondary | ICD-10-CM

## 2020-11-03 DIAGNOSIS — K269 Duodenal ulcer, unspecified as acute or chronic, without hemorrhage or perforation: Secondary | ICD-10-CM

## 2020-11-03 LAB — TYPE AND SCREEN
ABO/RH(D): O NEG
Antibody Screen: NEGATIVE
Unit division: 0

## 2020-11-03 LAB — CBC
HCT: 29.3 % — ABNORMAL LOW (ref 36.0–46.0)
HCT: 31.1 % — ABNORMAL LOW (ref 36.0–46.0)
Hemoglobin: 9.3 g/dL — ABNORMAL LOW (ref 12.0–15.0)
Hemoglobin: 9.8 g/dL — ABNORMAL LOW (ref 12.0–15.0)
MCH: 27.8 pg (ref 26.0–34.0)
MCH: 27.6 pg (ref 26.0–34.0)
MCHC: 31.7 g/dL (ref 30.0–36.0)
MCHC: 31.5 g/dL (ref 30.0–36.0)
MCV: 87.5 fL (ref 80.0–100.0)
MCV: 87.6 fL (ref 80.0–100.0)
Platelets: 167 10*3/uL (ref 150–400)
Platelets: 165 10*3/uL (ref 150–400)
RBC: 3.35 MIL/uL — ABNORMAL LOW (ref 3.87–5.11)
RBC: 3.55 MIL/uL — ABNORMAL LOW (ref 3.87–5.11)
RDW: 16.8 % — ABNORMAL HIGH (ref 11.5–15.5)
RDW: 16.6 % — ABNORMAL HIGH (ref 11.5–15.5)
WBC: 4 10*3/uL (ref 4.0–10.5)
WBC: 4.3 10*3/uL (ref 4.0–10.5)
nRBC: 0 % (ref 0.0–0.2)
nRBC: 0 % (ref 0.0–0.2)

## 2020-11-03 LAB — BASIC METABOLIC PANEL
Anion gap: 9 (ref 5–15)
BUN: 12 mg/dL (ref 8–23)
CO2: 24 mmol/L (ref 22–32)
Calcium: 8.6 mg/dL — ABNORMAL LOW (ref 8.9–10.3)
Chloride: 108 mmol/L (ref 98–111)
Creatinine, Ser: 0.78 mg/dL (ref 0.44–1.00)
GFR, Estimated: >60 mL/min (ref >60)
Glucose, Bld: 97 mg/dL (ref 70–99)
Potassium: 4.1 mmol/L (ref 3.5–5.1)
Sodium: 141 mmol/L (ref 135–145)

## 2020-11-03 LAB — BPAM RBC
Blood Product Expiration Date: 202203292359
ISSUE DATE / TIME: 202203120941
Unit Type and Rh: 9500

## 2020-11-03 MED ORDER — SUCRALFATE 1 GM/10ML PO SUSP: 1.0000 g | Freq: Three times a day (TID) | ORAL | 2 refills | Status: DC

## 2020-11-03 MED ORDER — FERROUS SULFATE 325 (65 FE) MG PO TABS: 325.0000 mg | ORAL_TABLET | Freq: Every day | ORAL | 5 refills | Status: DC

## 2020-11-03 MED ORDER — AMLODIPINE BESYLATE 2.5 MG PO TABS: 2.5000 mg | ORAL_TABLET | Freq: Every day | ORAL | 3 refills | Status: DC

## 2020-11-03 MED ORDER — PANTOPRAZOLE SODIUM 40 MG PO TBEC: 40.0000 mg | DELAYED_RELEASE_TABLET | Freq: Two times a day (BID) | ORAL | 6 refills | Status: DC

## 2020-11-03 MED ORDER — RIVAROXABAN 10 MG PO TABS: 10.0000 mg | ORAL_TABLET | Freq: Every evening | ORAL | 2 refills | Status: DC

## 2020-11-03 NOTE — Progress Notes (Signed)
Maria Murphy, M.D. Gastroenterology & Hepatology   Interval History:  Patient reports having slightly worse epigastric abdominal pain today but able to tolerate diet adequately, denies having any nausea or vomiting.  Has not had any more episodes of melena and states feeling better overall. The patient told me that she was using Voltaren ointment every 6 hours for her knee pain chronically as she did not know this medication could worsen her episodes of bleeding.  Patient received 1 unit of PRBC with adequate response, repeat hemoglobin today was 9.3.  She has remained hemodynamically stable.    Inpatient Medications:  Current Facility-Administered Medications:  .  0.9 %  sodium chloride infusion (Manually program via Guardrails IV Fluids), , Intravenous, Once, Emokpae, Courage, MD .  amLODipine (NORVASC) tablet 2.5 mg, 2.5 mg, Oral, Daily, Emokpae, Courage, MD, 2.5 mg at 11/03/20 0923 .  labetalol (NORMODYNE) injection 5 mg, 5 mg, Intravenous, Q6H PRN, Adefeso, Oladapo, DO .  morphine 2 MG/ML injection 2 mg, 2 mg, Intravenous, Q4H PRN, Adefeso, Oladapo, DO, 2 mg at 11/03/20 0747 .  ondansetron (ZOFRAN) injection 4 mg, 4 mg, Intravenous, Q6H PRN, Adefeso, Oladapo, DO .  pantoprazole (PROTONIX) injection 40 mg, 40 mg, Intravenous, Q12H, Adefeso, Oladapo, DO, 40 mg at 11/03/20 0922 .  sucralfate (CARAFATE) tablet 1 g, 1 g, Oral, Q6H, Emokpae, Courage, MD, 1 g at 11/03/20 0525 .  traZODone (DESYREL) tablet 50 mg, 50 mg, Oral, QHS PRN, Denton Brick, Courage, MD   I/O    Intake/Output Summary (Last 24 hours) at 11/03/2020 1035 Last data filed at 11/02/2020 1843 Gross per 24 hour  Intake 1248 ml  Output --  Net 1248 ml     Physical Exam: Temp:  [97.5 F (36.4 C)-98.3 F (36.8 C)] 97.5 F (36.4 C) (03/13 0519) Pulse Rate:  [46-63] 46 (03/13 0519) Resp:  [16] 16 (03/13 0519) BP: (116-120)/(63-73) 116/71 (03/13 0519) SpO2:  [95 %-100 %] 95 % (03/13 0519)  Temp (24hrs), Avg:97.9 F (36.6  C), Min:97.5 F (36.4 C), Max:98.3 F (36.8 C)  GENERAL: The patient is AO x3, in no acute distress. HEENT: Head is normocephalic and atraumatic. EOMI are intact. Mouth is well hydrated and without lesions. NECK: Supple. No masses LUNGS: Clear to auscultation. No presence of rhonchi/wheezing/rales. Adequate chest expansion HEART: RRR, normal s1 and s2. ABDOMEN: Mildly tender on palpation of the epigastric and right upper quadrant areas, no guarding, no peritoneal signs, and nondistended. BS +. No masses. EXTREMITIES: Without any cyanosis, clubbing, rash, lesions or edema. NEUROLOGIC: AOx3, no focal motor deficit. SKIN: no jaundice, no rashes  Laboratory Data: CBC:     Component Value Date/Time   WBC 4.0 11/03/2020 0510   RBC 3.35 (L) 11/03/2020 0510   HGB 9.3 (L) 11/03/2020 0510   HCT 29.3 (L) 11/03/2020 0510   PLT 167 11/03/2020 0510   MCV 87.5 11/03/2020 0510   MCH 27.8 11/03/2020 0510   MCHC 31.7 11/03/2020 0510   RDW 16.8 (H) 11/03/2020 0510   LYMPHSABS 1.1 10/30/2020 0814   MONOABS 0.3 10/30/2020 0814   EOSABS 0.1 10/30/2020 0814   BASOSABS 0.0 10/30/2020 0814   COAG:  Lab Results  Component Value Date   INR 2.0 (H) 11/02/2020   INR 3.7 (H) 11/01/2020   INR 1.1 10/19/2020    BMP:  BMP Latest Ref Rng & Units 11/03/2020 11/02/2020 11/01/2020  Glucose 70 - 99 mg/dL 97 99 115(H)  BUN 8 - 23 mg/dL _0 Creatinine 0.44 - 1.00 mg/dL  0.78 0.73 0.87  Sodium 135 - 145 mmol/L 141 139 138  Potassium 3.5 - 5.1 mmol/L 4.1 4.1 3.9  Chloride 98 - 111 mmol/L 108 110 106  CO2 22 - 32 mmol/L _0 Calcium 8.9 - 10.3 mg/dL 8.6(L) 8.4(L) 9.0    HEPATIC:  Hepatic Function Latest Ref Rng & Units 11/02/2020 11/01/2020 10/30/2020  Total Protein 6.5 - 8.1 g/dL 5.4(L) 6.7 6.2(L)  Albumin 3.5 - 5.0 g/dL 3.2(L) 4.0 3.8  AST 15 - 41 U/L _1 ALT 0 - 44 U/L _2 Alk Phosphatase 38 - 126 U/L 37(L) 46 46  Total Bilirubin 0.3 - 1.2 mg/dL 0.3 0.5 0.5    CARDIAC:  Lab Results   Component Value Date   CKTOTAL 75 03/26/2020      Imaging: I personally reviewed and interpreted the available labs, imaging and endoscopic files.   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  Patient has presented recurrent bleeding clinically as she has presented more episodes of melena with significant drop in her hemoglobin.  She has had recent endoscopic investigations that have shown presence of persistent ulceration in the duodenum.  I do suspect that her refractory worsening ulcer secondary to the use of topical NSAIDs, but other conditions such as upper gastrointestinal Crohn's disease should be considered in the differential given size and refractoriness of the ulcer.  H. pylori serology was negative but other tests have not been performed as she has been on PPI since her initial presentation.  She will need to have a repeat endoscopic evaluation at some point to assess wound healing and also obtain biopsies from the edges of the duodenal ulcer but not emergently as she presented adequate response to PRBC transfusion  and has remained hemodynamically stable.  I will inform Dr. Rush Landmark about the size of this ulcer since it is possible this may complicate performing her scheduled endoscopic ultrasound.  I emphasized to her the importance of avoiding any NSAIDs, even the topical NSAIDs as these may lead to persistence of the ulceration. She should continue with IV PPI twice daily and Carafate 1 g every 6 hours.  Also, should consider switching to a a  stronger PPI in the future such as Dexilant or Zegerid if the patient can afford.   More importantly, I consider that discussion with the patient regarding definite discontinuation of Xarelto should be held as she has almost finished the timeframe that she should be taking this medication and it is clear that this has increased her risk of gastrointestinal bleeding.  #Upper gastrointestinal bleeding #Large duodenal ulcer #History of NSAID use - Check CBC daily, transfuse if Hb <7 - Hold Xarelto now and discuss possibility of definitive discontinuation - Pantoprazole 40 mg q12h IVP - Sucralfate 1 g every 6 hours - 2 large bore IV lines - Active T/S -Advance diet as tolerated - Avoid NSAIDs, even topical - Consider outpatient EGD for follow up of duodenal ulcer given size - patient scheduled for endoscopic ultrasound on 11/20/2020 -will inform Dr. Rush Landmark about the size of the ulcer - Consider switching to high dose Dexilant or zegerid as outpatient -  Follow up in GI clinic with [facility] clinic  Maria Peppers, MD Gastroenterology and Hepatology [facility]

## 2020-11-03 NOTE — Discharge Instructions (Signed)
1)Avoid ibuprofen/Advil/Aleve/Motrin/Goody Powders/Naproxen/BC powders/Meloxicam/Diclofenac/Indomethacin and other Nonsteroidal anti-inflammatory medications as these will make you more likely to bleed and can cause stomach ulcers, can also cause Kidney problems.   2)Repeat CBC and BMP 11/11/2020  3)Change Xarelto to 10 mg daily--- starting on Thursday 11/08/20---

## 2020-11-03 NOTE — Plan of Care (Signed)

## 2020-11-03 NOTE — Progress Notes (Signed)
Pt tolerated regular diet well and discharged with instructions to ER parking lot where pt claimed her ride was waiting.

## 2020-11-03 NOTE — TOC Transition Note (Incomplete)
Transition of Care [facility]) - CM/SW Discharge Note   Patient Details  Name: Maria Murphy MRN: [id number] Date of Birth: 30-Aug-1955  [facility] of [facility] CM/SW Contact:  Natasha Bence, LCSW Phone Number: 11/03/2020, 10:46 AM   Clinical Narrative:    CSW received consult for [facility].          Patient Goals and CMS Choice        Discharge Placement                       Discharge Plan and Services                                     Social Determinants of Health (SDOH) Interventions     Readmission Risk Interventions Readmission Risk Prevention Plan 09/27/2020 09/26/2020 05/17/2020  Medication Screening - - -  Transportation Screening Complete Complete Complete  PCP or Specialist Appt within 5-7 Days - - Complete  Home Care Screening - - Complete  Medication Review (RN CM) - - Complete  Medication Review Press photographer) Complete Complete -  PCP or Specialist appointment within 3-5 days of discharge - Complete -  [place] or [place] - Complete -  SW Recovery Care/Counseling Consult - Complete -  Palliative Care Screening - Not Applicable -  [place] - Not Applicable -  Some recent data might be hidden

## 2020-11-03 NOTE — Discharge Summary (Addendum)
Maria Murphy, is a 65 y.o. female  DOB 1956-04-16  MRN [id number].  Admission date:  11/01/2020  Admitting Physician  Nolie Bignell Mariea Clonts, MD  Discharge Date:  11/03/2020   Primary MD  Marcine Matar, MD  Recommendations for primary care physician for things to follow:   1)Avoid ibuprofen/Advil/Aleve/Motrin/Goody Powders/Naproxen/BC powders/Meloxicam/Diclofenac/Indomethacin and other Nonsteroidal anti-inflammatory medications as these will make you more likely to bleed and can cause stomach ulcers, can also cause Kidney problems.   2)Repeat CBC and BMP 11/11/2020  3)Change Xarelto to 10 mg daily--- starting on Thursday 11/08/20---   Admission Diagnosis  Melena [K92.1] PUD (peptic ulcer disease) [K27.9] GI bleed [K92.2] Mass of pancreas [K86.89] Acute GI bleeding [K92.2]  Discharge Diagnosis  Melena [K92.1] PUD (peptic ulcer disease) [K27.9] GI bleed [K92.2] Mass of pancreas [K86.89] Acute GI bleeding [K92.2]    Principal Problem:   GI bleed Active Problems:   Essential hypertension   Abdominal pain   Duodenal ulcer   Pancreatic mass   History of pulmonary embolus (PE)   Obesity (BMI 30-39.9)   GERD (gastroesophageal reflux disease)   Gastric ulcer   Supratherapeutic INR   Nausea   Acute GI bleeding      Past Medical History:  Diagnosis Date  . Collagen vascular disease (HCC)   . Essential hypertension, benign 01/24/2015  . Foot ulcer (HCC)   . GERD (gastroesophageal reflux disease)   . Hypertension   . Memory loss   . PUD (peptic ulcer disease)    with perforation s/p exploratory laparotomy  . Skin cancer   . Vision abnormalities     Past Surgical History:  Procedure Laterality Date  . CENTRAL LINE INSERTION Right 04/22/2020   Procedure: CENTRAL LINE INSERTION;  Surgeon: Lucretia Roers, MD;  Location: [facility];  Service: General;  Laterality: Right;  . DIAGNOSTIC  LAPAROSCOPY    . ESOPHAGOGASTRODUODENOSCOPY (EGD) WITH PROPOFOL N/A 07/10/2020   Procedure: ESOPHAGOGASTRODUODENOSCOPY (EGD) WITH PROPOFOL;  Surgeon: Malissa Hippo, MD;  Location: [facility];  Service: Endoscopy;  Laterality: N/A;  . ESOPHAGOGASTRODUODENOSCOPY (EGD) WITH PROPOFOL N/A 10/18/2020   Procedure: ESOPHAGOGASTRODUODENOSCOPY (EGD) WITH PROPOFOL;  Surgeon: Dolores Frame, MD;  Location: [facility];  Service: Gastroenterology;  Laterality: N/A;  . GASTRORRHAPHY  04/22/2020   Procedure: GASTRORRHAPHY;  Surgeon: Lucretia Roers, MD;  Location: [facility];  Service: General;;  . IR CATHETER TUBE CHANGE  05/09/2020  . LAPAROTOMY N/A 04/22/2020   Procedure: EXPLORATORY LAPAROTOMY;  Surgeon: Lucretia Roers, MD;  Location: [facility];  Service: General;  Laterality: N/A;  . TONSILLECTOMY       HPI  from the history and physical done on the day of admission:    Chief Complaint: Rectal bleeding  HPI: Maria Murphy is a 65 y.o. female with medical history significant for PUD/duodenal ulcers,prior PEs on Xarelto, hypertension, GERD, obesity, and cholelithiasis who presentedto the ED due to 2-day onset of worsening abdominal pain, black tarry stools which was associated with lightheadedness and dizziness.  Patient was diagnosed  with pancreatic mass and has been having several months of ongoing right upper quadrant pain.  She takes oxycodone at home without relief and she presented to the ED 2 days ago for medication refill dizziness which worsens on standing.  Patient denies chest pain, shortness of breath, nausea, vomiting, fever or chills.  Patient is still on Xarelto per ED medical record She was admitted here from 2/25-3/2 due to GI bleed during which EGD done showed nonbleeding gastric ulcers; large duodenal ulcer in the duodenal bulb, close to 35 mm in diameter;no presence of stigmata of recent bleeding but there was presence of a suture at the bed of the ulcer which was  semicircumferential.  ED Course:  In the emergency department, she was hemodynamically stable scratch that.  Work-up in the ED showed normocytic anemia and normal CBC except for mild hyperglycemia.  FOBT was positive, troponin x2 was flat at 3.  INR was elevated at 3.7. Abdominal x-ray with 1 view chest done showed no evidence of bowel obstruction no acute cardiopulmonary disease. Pain medication was given, Protonix was given. Gastroenterologist (Dr. Levon Hedger) was consulted and recommended observing patient overnight, trend hemoglobin and hold Xarelto per ED medical record   Hospital Course:   Brief Summary:- 65 y.o.femalewith medical history significant forPUD/duodenal ulcers,prior PEs on Xarelto, hypertension,GERD,obesity, and cholelithiasis admitted on 11/02/2020 with black tarry stools and drop in hemoglobin while on Xarelto  A/p 1)Acute GI bleed----while on Xarelto -Xarelto was held -G treated with IV Protonix and p.o. Carafate -H&H stable after transfusion so no need for EGD at this time -Patient previously Underwent an EGD on 10/18/2020 which showed few erosions in the gastric body, also had a superficial gastric ulcer with clean base in the pylorus and also had a large duodenal ulcer up to 35 mm in the duodenal bulb with presence of one suture crossing the ulcer  2)PUD/pancreatic mass/abdominal pain-prior EGD from 10/18/2020 as noted above #1 --EUS upcoming 11/20/20 by Dr. Meridee Score -As needed opiates for pain control -Treated with IV Protonix and p.o. Carafate as above #1 -Discharge on oral Protonix -Ongoing outpatient follow-up with GI for pancreatic mass  3)Acute on chronic anemia--worsening anemia due to ABLA, management as above in #1 -Hgb 7.3>>9.3>>9.8 after transfusion of 1 unit of PRBC  4)HTN--c/n  amlodipine   5)H/o DVT/PE-- Dxed on 05/01/20, Xarelto was held from 11/01/20 -Given possible Pancreatic Malignancy there is concern for hypercoagulable  state -Patient has completed 6 months of therapeutic Xarelto -Okay to decrease to Xarelto 10 mg daily for DVT prophylaxis in the setting of possible malignancy induced hypercoagulable state -Xarelto 10 mg daily can be started around 11/09/2019 -However if EUS and other work-up for possible pancreatic malignancy is negative--patient may be able to go off anticoagulation altogether  - 6)Inflamed Gallbladder appearance -09/18/2020 HIDA scan normal -10/18/20 RUQ US--Cholelithiasis and mild gallbladder wall thickening. -Recently evaluated by general surgery and operative intervention deferred   NB!! --Risk Versus benefit of anticoagulation:- --Given possible Pancreatic Malignancy there is concern for hypercoagulable state -Patient has completed 6 months of therapeutic Xarelto -Okay to decrease to Xarelto 10 mg daily for DVT prophylaxis in the setting of possible malignancy induced hypercoagulable state -Xarelto 10 mg daily can be started around 11/09/2019 -However if EUS and other work-up for possible pancreatic malignancy is negative--patient may be able to go off anticoagulation altogether  Disposition- home   Disposition: The patient is from: Home  Anticipated d/c is to: Home   Code Status :  -  Code Status:  Full Code   Family Communication:    (patient is alert, awake and coherent)   Consults  :  Gi  Discharge Condition: stable  Follow UP   Follow-up Information    Marguerita Merles, Reuel Boom, MD. Schedule an appointment as soon as possible for a visit in 1 week(s).   Specialty: Gastroenterology Why: Repeat CBC and BMP Contact information: [address] [place] [address] [phone]               Consults obtained -   Diet and Activity recommendation:  As advised  Discharge Instructions    Discharge Instructions    Call MD for:  persistant dizziness or light-headedness   Complete by: As directed    Call MD for:   persistant nausea and vomiting   Complete by: As directed    Call MD for:  severe uncontrolled pain   Complete by: As directed    Call MD for:  temperature >100.4   Complete by: As directed    Diet - low sodium heart healthy   Complete by: As directed    Discharge instructions   Complete by: As directed    1)Avoid ibuprofen/Advil/Aleve/Motrin/Goody Powders/Naproxen/BC powders/Meloxicam/Diclofenac/Indomethacin and other Nonsteroidal anti-inflammatory medications as these will make you more likely to bleed and can cause stomach ulcers, can also cause Kidney problems.   2)Repeat CBC and BMP 11/11/2020  3)Change Xarelto to 10 mg daily--- starting on Thursday 11/08/20---   Increase activity slowly   Complete by: As directed        Discharge Medications     Allergies as of 11/03/2020      Reactions   Wheat Extract Swelling      Medication List    STOP taking these medications   diclofenac Sodium 1 % Gel Commonly known as: Voltaren   oxyCODONE 5 MG immediate release tablet Commonly known as: Roxicodone     TAKE these medications   acetaminophen 500 MG tablet Commonly known as: TYLENOL Take 1 tablet (500 mg total) by mouth every 6 (six) hours as needed for mild pain or fever. What changed: when to take this   amLODipine 2.5 MG tablet Commonly known as: NORVASC Take 1 tablet (2.5 mg total) by mouth daily. For BP What changed: additional instructions   donepezil 10 MG tablet Commonly known as: ARICEPT Take 0.5-1 tablets (5-10 mg total) by mouth in the morning and at bedtime.   ferrous sulfate 325 (65 FE) MG tablet Take 1 tablet (325 mg total) by mouth daily with breakfast.   gabapentin 300 MG capsule Commonly known as: NEURONTIN Take 1 capsule (300 mg total) by mouth 2 (two) times daily.   lipase/protease/amylase 39767 UNITS Cpep capsule Commonly known as: Creon Take 2 capsules (72,000 Units total) by mouth 3 (three) times daily with meals. May also take 1 capsule  (36,000 Units total) as needed (with snacks).   mirtazapine 15 MG tablet Commonly known as: Remeron Take 1 tablet (15 mg total) by mouth at bedtime.   ondansetron 8 MG disintegrating tablet Commonly known as: Zofran ODT Take 1 tablet (8 mg total) by mouth every 8 (eight) hours as needed for nausea or vomiting.   pantoprazole 40 MG tablet Commonly known as: PROTONIX Take 1 tablet (40 mg total) by mouth 2 (two) times daily.   rivaroxaban 10 MG Tabs tablet Commonly known as: XARELTO Take 1 tablet (10 mg total) by mouth every evening. Start taking on: November 08, 2020 What changed:   medication  strength  how much to take  when to take this  additional instructions  These instructions start on November 08, 2020. If you are unsure what to do until then, ask your doctor or other care provider.   sucralfate 1 GM/10ML suspension Commonly known as: CARAFATE Take 10 mLs (1 g total) by mouth 4 (four) times daily -  with meals and at bedtime.      Major procedures and Radiology Reports - PLEASE review detailed and final reports for all details, in brief -   CT ABDOMEN PELVIS W CONTRAST  Result Date: 10/15/2020 CLINICAL DATA:  64 year old female with abdominal pain for 6 days. History of exploratory laparotomy, perforated gastric ulcer in August 2021. EXAM: CT ABDOMEN AND PELVIS WITH CONTRAST TECHNIQUE: Multidetector CT imaging of the abdomen and pelvis was performed using the standard protocol following bolus administration of intravenous contrast. CONTRAST:  OMNIPAQUE IOHEXOL 300 MG/ML  SOLN COMPARISON:  CT Abdomen and Pelvis 09/24/2020 and earlier, including MRI 04/22/2020. FINDINGS: Lower chest: Stable lung bases and cardiac size at the upper limits of normal. No pericardial or pleural effusion. Hepatobiliary: Chronic cholelithiasis with gallstones individually up to 19 mm. Fat at the porta hepatis and the gallbladder wall remain indistinct similar to earlier this month. No progressive  gallbladder wall thickening. No intrahepatic biliary ductal dilatation. Stable liver enhancement including tiny low-density area near the dome on the right which has a benign appearance. No perihepatic free fluid. Pancreas: Multi cystic, partially calcified mass of the pancreatic head measuring up to 43 mm long axis (series 2, image 29) appears larger since August 2021 by up to 10 mm. Main pancreatic duct also appears more prominent since that time. But there is no convincing primary pancreatic inflammation. No free fluid in the lesser sac. Spleen: Negative. Adrenals/Urinary Tract: Normal adrenal glands. Both kidneys are stable since August including mildly complex partially calcified exophytic right upper pole cystic lesion - which showed no suspicious enhancement on August MRI. Superimposed benign appearing parapelvic and left renal midpole cysts. Normal renal contrast excretion on delayed images. No perinephric stranding. No definite nephrolithiasis. Decompressed ureters. Negative urinary bladder. Chronic pelvic phleboliths. Stomach/Bowel: Negative rectum. Extensive sigmoid diverticulosis with no active inflammation. Redundant transverse colon. Retained stool in the transverse and right colon. Cecum is on a lax mesentery located in the right mid abdomen. Normal appendix visible on series 5, image 30. No large bowel inflammation. Negative terminal ileum. No dilated small bowel. Small gastric hiatal hernia. No free air. No free fluid identified. But there remains an indistinct appearance of the gastric antrum and proximal duodenum (series 2, image 24 and series 5, image 24) with increased mesenteric stranding from earlier this month. Regional mucosal hyperenhancement and wall thickening. No extraluminal gas identified. Vascular/Lymphatic: Major arterial structures in the abdomen and pelvis are patent and mildly tortuous. Mild Aortoiliac calcified atherosclerosis. Portal venous system is patent. No lymphadenopathy.  Reproductive: Lobulated, fibroid uterus including multiple exophytic, subserosal fibroids anteriorly and at the fundus which are individually up to 37 mm diameter and not significantly changed since 04/20/2020. Adnexa appear stable and within normal limits. Other: No pelvic free fluid. Chronic postoperative changes to the ventral upper abdomen are stable. Musculoskeletal: Intermittent advanced disc and endplate degeneration in the visible spine. Grade 1 lumbar spondylolisthesis. No acute osseous abnormality identified. IMPRESSION: 1. 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 This was evaluated by MRI in August 2021 (please see that report), and appears mildly larger (4.3 cm) with increased conspicuity of  the pancreatic duct since that time. 2. Continued inflamed appearance of the distal stomach and proximal duodenum compatible with Recurrent Acute Peptic Ulcer Disease. Adjacent mesenteric fat stranding seems increased since 09/24/2020, although there is no evidence of perforation. 3. Persistent mildly inflamed appearance of the gallbladder and porta hepatis region also, with underlying Cholelithiasis. 4. Otherwise stable abdomen and pelvis, including renal cysts which had a benign MRI appearance, sigmoid diverticulosis, fibroid uterus. Electronically Signed   By: Odessa Fleming M.D.   On: 10/15/2020 04:42   US Abdomen Limited  Result Date: 10/18/2020 CLINICAL DATA:  Abdominal pain.  History of gallbladder stones. EXAM: ULTRASOUND ABDOMEN LIMITED RIGHT UPPER QUADRANT COMPARISON:  CT of the abdomen and pelvis on 10/15/2020 and ultrasound of the abdomen 09/24/2020 FINDINGS: Gallbladder: Gallbladder wall is mildly thickened, 3.6 millimeters. There are numerous stones within the gallbladder, measuring up to 1.5 centimeters. No pericholecystic fluid. Note is made of a positive sonographic Murphy's sign. Common bile duct: Diameter: 5.8 millimeters Liver: Focal areas of increased attenuation  particularly within the gallbladder fossa, consistent with focal fatty infiltration. No focal liver lesions are identified. Portal vein is patent on color Doppler imaging with normal direction of blood flow towards the liver. Other: None. IMPRESSION: 1. Cholelithiasis and mild gallbladder wall thickening. 2. Sonographic Murphy's sign is present today, new since the previous ultrasound exam. Consider acute cholecystitis. 3. There is no pericholecystic fluid. 4. Focal fatty infiltration of the liver. These results will be called to the ordering clinician or representative by the Radiologist Assistant, and communication documented in the PACS or [employer]. Electronically Signed   By: Norva Pavlov M.D.   On: 10/18/2020 10:22   DG Abdomen Acute W/Chest  Result Date: 11/01/2020 CLINICAL DATA:  Abdominal pain with black, tarry stools. EXAM: DG ABDOMEN ACUTE WITH 1 VIEW CHEST COMPARISON:  October 18, 2020 FINDINGS: There is no evidence of dilated bowel loops or free intraperitoneal air. No radiopaque calculi are seen. A stable 1.1 cm soft tissue calcification is seen overlying the medial aspect of the mid to upper right abdomen. Heart size and mediastinal contours are within normal limits. Chronic appearing increased lung markings are seen without evidence of acute infiltrate. IMPRESSION: 1. No evidence of bowel obstruction or acute cardiopulmonary disease. 2. Stable soft tissue calcification within the abdomen which corresponds to the partially calcified pancreatic mass seen on prior studies. Electronically Signed   By: Aram Candela M.D.   On: 11/01/2020 23:55   DG Abdomen Acute W/Chest  Result Date: 10/18/2020 CLINICAL DATA:  Abdominal pain post surgery for perirectal abscess EXAM: DG ABDOMEN ACUTE WITH 1 VIEW CHEST COMPARISON:  Radiograph 10/16/2020, CT 10/15/2020 FINDINGS: Streaky basilar opacities and low volumes favoring atelectasis. No consolidation, features of edema, pneumothorax, or  effusion. Pulmonary vascularity is normally distributed. The cardiomediastinal contours are unremarkable. No subdiaphragmatic free air. Redemonstrated calcification at the level of the pancreatic head corresponding to a lesion on comparison CT. There is increasing air distention of the stomach and mid abdominal small bowel with a moderate to large colonic stool burden. Few calcified gallstones are less well visualized on this exam. No acute osseous abnormality. Degenerative changes in the shoulders, spine, hips and pelvis. The osseous structures appear diffusely demineralized which may limit detection of small or nondisplaced fractures. No worrisome findings of the remaining soft tissues. IMPRESSION: 1. Moderate to large colonic stool burden with increasing air distention of the stomach and mid abdominal small bowel possibly related to constipation though could reflect an early  or developing ileus in the appropriate clinical setting. No free air. 2. Calcification of the level of the pancreatic head corresponding to a pancreatic mass on comparison CT. 3. Lung volumes are low with streaky basilar opacities favoring atelectasis. Electronically Signed   By: Kreg Shropshire M.D.   On: 10/18/2020 04:09   DG Abdomen Acute W/Chest  Result Date: 10/16/2020 CLINICAL DATA:  Abdominal pain, change EXAM: DG ABDOMEN ACUTE WITH 1 VIEW CHEST COMPARISON:  CT 10/15/2020, ultrasound 09/24/2020, radiograph 09/24/2020 FINDINGS: Some mildly coarsened interstitial changes are similar to prior with basilar atelectasis. No consolidation, features of edema, pneumothorax, or effusion. Unchanged cardiomediastinal contours. No subdiaphragmatic free air. No high-grade obstructive bowel gas pattern. Moderate colonic stool burden. Redemonstration of the calcification at the level of the pancreatic head associated with a cystic pancreatic neoplasm on comparison CT imaging. Few calcified gallstones are noted as well. Vascular calcium of the abdomen  and pelvis. Few phleboliths in the deep pelvis. Extensive degenerative changes in the shoulders, spine, hips and pelvis. The osseous structures appear diffusely demineralized which may limit detection of small or nondisplaced fractures. No acute osseous abnormality or suspicious osseous lesion. IMPRESSION: 1. 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 No acute cardiopulmonary abnormality. 2. Nonobstructive bowel gas pattern. Moderate colonic stool burden. 3. Calcification associated with the pancreatic lesion on comparison CT. Partly visualized gallstones as well. Electronically Signed   By: Kreg Shropshire M.D.   On: 10/16/2020 05:26   Micro Results   Recent Results (from the past 240 hour(s))  Resp Panel by RT-PCR (Flu A&B, Covid) Nasopharyngeal Swab     Status: None   Collection Time: 11/02/20 12:08 AM   Specimen: Nasopharyngeal Swab; Nasopharyngeal(NP) swabs in vial transport medium  Result Value Ref Range Status   SARS Coronavirus 2 by RT PCR NEGATIVE NEGATIVE Final    Comment: (NOTE) SARS-CoV-2 target nucleic acids are NOT DETECTED.  The SARS-CoV-2 RNA is generally detectable in upper respiratory specimens during the acute phase of infection. The lowest concentration of SARS-CoV-2 viral copies this assay can detect is 138 copies/mL. A negative result does not preclude SARS-Cov-2 infection and should not be used as the sole basis for treatment or other patient management decisions. A negative result may occur with  improper specimen collection/handling, submission of specimen other than nasopharyngeal swab, presence of viral mutation(s) within the areas targeted by this assay, and inadequate number of viral copies(<138 copies/mL). A negative result must be combined with clinical observations, patient history, and epidemiological information. The expected result is Negative.  Fact Sheet for Patients:  BloggerCourse.com  Fact Sheet  for Healthcare Providers:  SeriousBroker.it  This test is no t yet approved or cleared by the [place] FDA and  has been authorized for detection and/or diagnosis of SARS-CoV-2 by FDA under an Emergency Use Authorization (EUA). This EUA will remain  in effect (meaning this test can be used) for the duration of the COVID-19 declaration under Section 564(b)(1) of the Act, 21 U.S.C.section 360bbb-3(b)(1), unless the authorization is terminated  or revoked sooner.       Influenza A by PCR NEGATIVE NEGATIVE Final   Influenza B by PCR NEGATIVE NEGATIVE Final    Comment: (NOTE) The Xpert Xpress SARS-CoV-2/FLU/RSV plus assay is intended as an aid in the diagnosis of influenza from Nasopharyngeal swab specimens and should not be used as a sole basis for treatment. Nasal washings and aspirates are unacceptable for Xpert Xpress SARS-CoV-2/FLU/RSV testing.  Fact Sheet for Patients: BloggerCourse.com  Fact Sheet for Healthcare Providers: SeriousBroker.it  This test is not yet approved or cleared by the [place] FDA and has been authorized for detection and/or diagnosis of SARS-CoV-2 by FDA under an Emergency Use Authorization (EUA). This EUA will remain in effect (meaning this test can be used) for the duration of the COVID-19 declaration under Section 564(b)(1) of the Act, 21 U.S.C. section 360bbb-3(b)(1), unless the authorization is terminated or revoked.  Performed at [facility], [address], [place], [place] [address]    Today   Subjective    Latrinia Bashor today has no new complaints No fever  Or chills  - No Nausea, Vomiting or Diarrhea -had BM-without blood or melena   Patient has been seen and examined prior to discharge   Objective   Blood pressure 116/71, pulse (!) 46, temperature (!) 97.5 F (36.4 C), temperature source Oral, resp. rate 16, height 5\' 4"  (1.626 m), weight 85.5  kg, SpO2 95 %.  Intake/Output Summary (Last 24 hours) at 11/03/2020 1353 Last data filed at 11/02/2020 1843 Gross per 24 hour  Intake 1248 ml  Output --  Net 1248 ml   Exam Gen:- Awake Alert,  In no apparent distress  HEENT:- .AT, No sclera icterus Neck-Supple Neck,No JVD,.  Lungs-  CTAB , fair symmetrical air movement CV- S1, S2 normal, regular  Abd-  +ve B.Sounds, Abd Soft, ND, NT Extremity/Skin:- No  edema, pedal pulses present  Psych-affect is appropriate, oriented x3 Neuro-Generalized weakness, no new focal deficits, no tremors   Data Review   CBC w Diff:  Lab Results  Component Value Date   WBC 4.3 11/03/2020   HGB 9.8 (L) 11/03/2020   HCT 31.1 (L) 11/03/2020   PLT 165 11/03/2020   LYMPHOPCT 22 10/30/2020   MONOPCT 6 10/30/2020   EOSPCT 3 10/30/2020   BASOPCT 0 10/30/2020   CMP:  Lab Results  Component Value Date   NA 141 11/03/2020   K 4.1 11/03/2020   CL 108 11/03/2020   CO2 24 11/03/2020   BUN 12 11/03/2020   CREATININE 0.78 11/03/2020   PROT 5.4 (L) 11/02/2020   ALBUMIN 3.2 (L) 11/02/2020   BILITOT 0.3 11/02/2020   ALKPHOS 37 (L) 11/02/2020   AST 15 11/02/2020   ALT 8 11/02/2020   Total Discharge time is about 33 minutes  Shon Hale M.D on 11/03/2020 at 1:53 PM  Go to [email or web address] -  for contact info  [facility] - Office  [phone]

## 2020-11-04 ENCOUNTER — Inpatient Hospital Stay: Payer: Medicaid Other | Attending: Oncology | Admitting: Oncology

## 2020-11-04 ENCOUNTER — Encounter: Payer: Self-pay | Admitting: Oncology

## 2020-11-04 ENCOUNTER — Telehealth: Payer: Self-pay

## 2020-11-04 ENCOUNTER — Inpatient Hospital Stay: Payer: Medicaid Other

## 2020-11-04 ENCOUNTER — Telehealth: Payer: Self-pay | Admitting: *Deleted

## 2020-11-04 VITALS — BP 145/97 | HR 69 | Temp 98.3°F | Resp 16 | Ht 64.0 in | Wt 181.3 lb

## 2020-11-04 DIAGNOSIS — Z7901 Long term (current) use of anticoagulants: Secondary | ICD-10-CM | POA: Insufficient documentation

## 2020-11-04 DIAGNOSIS — I2699 Other pulmonary embolism without acute cor pulmonale: Secondary | ICD-10-CM | POA: Diagnosis not present

## 2020-11-04 DIAGNOSIS — K760 Fatty (change of) liver, not elsewhere classified: Secondary | ICD-10-CM | POA: Diagnosis not present

## 2020-11-04 DIAGNOSIS — D649 Anemia, unspecified: Secondary | ICD-10-CM | POA: Diagnosis not present

## 2020-11-04 DIAGNOSIS — K8020 Calculus of gallbladder without cholecystitis without obstruction: Secondary | ICD-10-CM | POA: Insufficient documentation

## 2020-11-04 DIAGNOSIS — I10 Essential (primary) hypertension: Secondary | ICD-10-CM | POA: Diagnosis not present

## 2020-11-04 DIAGNOSIS — K862 Cyst of pancreas: Secondary | ICD-10-CM | POA: Insufficient documentation

## 2020-11-04 DIAGNOSIS — Z79899 Other long term (current) drug therapy: Secondary | ICD-10-CM | POA: Diagnosis not present

## 2020-11-04 DIAGNOSIS — K921 Melena: Secondary | ICD-10-CM | POA: Diagnosis not present

## 2020-11-04 DIAGNOSIS — K269 Duodenal ulcer, unspecified as acute or chronic, without hemorrhage or perforation: Secondary | ICD-10-CM | POA: Diagnosis not present

## 2020-11-04 DIAGNOSIS — I700 Atherosclerosis of aorta: Secondary | ICD-10-CM | POA: Diagnosis not present

## 2020-11-04 DIAGNOSIS — E669 Obesity, unspecified: Secondary | ICD-10-CM | POA: Diagnosis not present

## 2020-11-04 DIAGNOSIS — K219 Gastro-esophageal reflux disease without esophagitis: Secondary | ICD-10-CM | POA: Diagnosis not present

## 2020-11-04 DIAGNOSIS — M4316 Spondylolisthesis, lumbar region: Secondary | ICD-10-CM | POA: Diagnosis not present

## 2020-11-04 DIAGNOSIS — M1990 Unspecified osteoarthritis, unspecified site: Secondary | ICD-10-CM | POA: Diagnosis not present

## 2020-11-04 DIAGNOSIS — K8689 Other specified diseases of pancreas: Secondary | ICD-10-CM | POA: Diagnosis not present

## 2020-11-04 DIAGNOSIS — Z86711 Personal history of pulmonary embolism: Secondary | ICD-10-CM | POA: Diagnosis present

## 2020-11-04 DIAGNOSIS — R5383 Other fatigue: Secondary | ICD-10-CM | POA: Diagnosis not present

## 2020-11-04 DIAGNOSIS — G629 Polyneuropathy, unspecified: Secondary | ICD-10-CM | POA: Diagnosis not present

## 2020-11-04 LAB — CBC WITH DIFFERENTIAL/PLATELET
Abs Immature Granulocytes: 0.01 10*3/uL (ref 0.00–0.07)
Basophils Absolute: 0 10*3/uL (ref 0.0–0.1)
Basophils Relative: 1 %
Eosinophils Absolute: 0.1 10*3/uL (ref 0.0–0.5)
Eosinophils Relative: 1 %
HCT: 32.8 % — ABNORMAL LOW (ref 36.0–46.0)
Hemoglobin: 10.7 g/dL — ABNORMAL LOW (ref 12.0–15.0)
Immature Granulocytes: 0 %
Lymphocytes Relative: 19 %
Lymphs Abs: 0.9 10*3/uL (ref 0.7–4.0)
MCH: 27.9 pg (ref 26.0–34.0)
MCHC: 32.6 g/dL (ref 30.0–36.0)
MCV: 85.6 fL (ref 80.0–100.0)
Monocytes Absolute: 0.3 10*3/uL (ref 0.1–1.0)
Monocytes Relative: 7 %
Neutro Abs: 3.6 10*3/uL (ref 1.7–7.7)
Neutrophils Relative %: 72 %
Platelets: 184 10*3/uL (ref 150–400)
RBC: 3.83 MIL/uL — ABNORMAL LOW (ref 3.87–5.11)
RDW: 16.6 % — ABNORMAL HIGH (ref 11.5–15.5)
WBC: 4.9 10*3/uL (ref 4.0–10.5)
nRBC: 0 % (ref 0.0–0.2)

## 2020-11-04 LAB — FOLATE: Folate: 8.1 ng/mL (ref >5.9)

## 2020-11-04 LAB — VITAMIN B12: Vitamin B-12: 195 pg/mL (ref 180–914)

## 2020-11-04 LAB — RETICULOCYTES
Immature Retic Fract: 13.3 % (ref 2.3–15.9)
RBC.: 3.85 MIL/uL — ABNORMAL LOW (ref 3.87–5.11)
Retic Count, Absolute: 74.3 10*3/uL (ref 19.0–186.0)
Retic Ct Pct: 1.9 % (ref 0.4–3.1)

## 2020-11-04 LAB — ANTITHROMBIN III: AntiThromb III Func: 102 % (ref 75–120)

## 2020-11-04 NOTE — Telephone Encounter (Signed)
Transition Care Management Unsuccessful Follow-up Telephone Call  Date of discharge and from where:  11/03/2020 - [facility]  Attempts:  1st Attempt  Reason for unsuccessful TCM follow-up call:  Voice mail full

## 2020-11-04 NOTE — Telephone Encounter (Signed)
Transition Care Management Follow-up Telephone Call  Date of discharge and from where: 11/03/2020, [facility]   How have you been since you were released from the hospital? The patient said she was at a doctor's appointment and requested that this CM call back at another time. Informed her that this CM will attempt to contact her tomorrow.

## 2020-11-04 NOTE — Progress Notes (Signed)
Pt says that she had bilateral pulmonary embolus in sept 2021. She says that a scan showed that pt has something in her liver. She hurts on right side-rating pain 7. She has been trying vegan diet but her protein and rbc going down. She feels that she may need to go back to eating meat. She states that she started one day having lots of stools and it was black and bloody and she had to have a blood transfusion on sat. At [facility].. she was told to stop the xarelto until she could be seen.
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
## 2020-11-05 ENCOUNTER — Other Ambulatory Visit: Payer: Self-pay

## 2020-11-05 ENCOUNTER — Ambulatory Visit (HOSPITAL_COMMUNITY): Payer: Medicaid Other

## 2020-11-05 ENCOUNTER — Telehealth: Payer: Self-pay

## 2020-11-05 ENCOUNTER — Encounter (HOSPITAL_COMMUNITY): Payer: Self-pay

## 2020-11-05 DIAGNOSIS — R29898 Other symptoms and signs involving the musculoskeletal system: Secondary | ICD-10-CM | POA: Diagnosis present

## 2020-11-05 DIAGNOSIS — M6281 Muscle weakness (generalized): Secondary | ICD-10-CM | POA: Diagnosis present

## 2020-11-05 DIAGNOSIS — M25662 Stiffness of left knee, not elsewhere classified: Secondary | ICD-10-CM | POA: Diagnosis present

## 2020-11-05 DIAGNOSIS — M25561 Pain in right knee: Secondary | ICD-10-CM

## 2020-11-05 DIAGNOSIS — M25661 Stiffness of right knee, not elsewhere classified: Secondary | ICD-10-CM | POA: Diagnosis present

## 2020-11-05 DIAGNOSIS — M25562 Pain in left knee: Secondary | ICD-10-CM | POA: Diagnosis present

## 2020-11-05 DIAGNOSIS — R2689 Other abnormalities of gait and mobility: Secondary | ICD-10-CM | POA: Diagnosis present

## 2020-11-05 LAB — HAPTOGLOBIN: Haptoglobin: 125 mg/dL (ref 37–355)

## 2020-11-05 LAB — CARDIOLIPIN ANTIBODIES, IGG, IGM, IGA
Anticardiolipin IgA: <9 APL U/mL (ref 0–11)
Anticardiolipin IgG: <9 GPL U/mL (ref 0–14)
Anticardiolipin IgM: <9 MPL U/mL (ref 0–12)

## 2020-11-05 LAB — PROTEIN S PANEL
Protein S Activity: 83 % (ref 63–140)
Protein S Ag, Free: 96 % (ref 61–136)
Protein S Ag, Total: 70 % (ref 60–150)

## 2020-11-05 LAB — CANCER ANTIGEN 19-9: CA 19-9: 5 U/mL (ref 0–35)

## 2020-11-05 LAB — HEXAGONAL PHASE PHOSPHOLIPID: Hex Phosph Neut Test: 3 s (ref 0–11)

## 2020-11-05 LAB — BETA-2-GLYCOPROTEIN I ABS, IGG/M/A
Beta-2 Glyco I IgG: <9 GPI IgG units (ref 0–20)
Beta-2-Glycoprotein I IgA: <9 GPI IgA units (ref 0–25)
Beta-2-Glycoprotein I IgM: <9 GPI IgM units (ref 0–32)

## 2020-11-05 LAB — LUPUS ANTICOAGULANT
DRVVT: 29.3 s (ref 0.0–47.0)
PTT Lupus Anticoagulant: 35.6 s (ref 0.0–51.9)
Thrombin Time: 15.8 s (ref 0.0–23.0)
dPT Confirm Ratio: 0.97 Ratio (ref 0.00–1.34)
dPT: 37.2 s (ref 0.0–47.6)

## 2020-11-05 LAB — PROTEIN C, TOTAL: Protein C, Total: 104 % (ref 60–150)

## 2020-11-05 LAB — HEX PHASE PHOSPHOLIPID REFLEX

## 2020-11-05 MED ORDER — LIDOCAINE 5 % EX PTCH: 1.0000 | MEDICATED_PATCH | CUTANEOUS | 0 refills | Status: DC

## 2020-11-05 NOTE — Telephone Encounter (Signed)
From the discharge call.  Patient has appointment with Dr Wynetta Emery 11/19/2020.     She said that she feels " horrible." She explained that she has constant right upper quadrant abdominal pain, under her ribs. She rates the pain 10/10 and said she has not been able to sleep for 2 nights. Last night she was screaming in pain.  She said that her neighbor came and gave her " something" for pain, she is not sure what, but it did help provide some pain relief. She said that tylenol does not help. When this CM asked her if she explained this to Dr Janese Banks yesterday, she said yes and she was told to speak to Dr Wynetta Emery who could give her pain medication.  Informed her that Dr Wynetta Emery is off; but this CM will share her concern with the covering provider.  She has an appointment with GI tomorrow.    she said she has all of her medications and did not have any questions about the med regime. She stated that Dr Janese Banks instructed her to hold the xarelto until after her EUS on 11/20/2020 and her meeting with Dr Janese Banks to discuss the results of the test.  Dr Janese Banks will provide instructions regarding xarelto use at that time.     needs some assistance. Has cane and walker to assist with ambulation. Has a PCS aide for 40 hours/month to assist with personal care. She said that the aides that she currently has are not able to come until 1200 but she needs someone earlier. Provided her with the phone number for [place] and instructed her to call and to request a change of PCS agency.

## 2020-11-05 NOTE — Telephone Encounter (Signed)
Transition Care Management Follow-up Telephone Call  Date of discharge and from where: 11/03/2020, [facility]  How have you been since you were released from the hospital?  She said that she feels " horrible." She explained that she has constant right upper quadrant abdominal pain, under her ribs. She rates the pain 10/10 and said she has not been able to sleep for 2 nights. Last night she was screaming in pain.  She said that her neighbor came and gave her " something" for pain, she is not sure what, but it did help provide some pain relief. She said that tylenol does not help. When this CM asked her if she explained this to Dr Janese Banks yesterday, she said yes and she was told to speak to Dr Wynetta Emery who could give her pain medication.  Informed her that Dr Wynetta Emery is off; but this CM will share her concern with the covering provider.  She has an appointment with GI tomorrow.   Any questions or concerns? Yes - see above  Items Reviewed:  Did the pt receive and understand the discharge instructions provided? Yes   Medications obtained and verified? Yes - she said she has all of her medications and did not have any questions about the med regime. She stated that Dr Janese Banks instructed her to hold the xarelto until after her EUS on 11/20/2020 and her meeting with Dr Janese Banks to discuss the results of the test.  Dr Janese Banks will provide instructions regarding xarelto use at that time.   Other? No   Any new allergies since your discharge? No   Do you have support at home? She has limited support. She lives alone and her sister comes to see her on Saturdays.   Home Care and Equipment/Supplies: Were home health services ordered? no If so, what is the name of the agency? n/a  Has the agency set up a time to come to the patient's home? not applicable Were any new equipment or medical supplies ordered?  No What is the name of the medical supply agency? n/a Were you able to get the supplies/equipment? not applicable Do  you have any questions related to the use of the equipment or supplies? No  Functional Questionnaire: (I = Independent and D = Dependent) ADLs: needs some assistance. Has cane and walker to assist with ambulation. Has a [employer] aide for 40 hours/month to assist with personal care. She said that the aides that she currently has are not able to come until 1200 but she needs someone earlier. Provided her with the phone number for [facility] and instructed her to call and to request a change of PCS agency.   Follow up appointments reviewed:   PCP Hospital f/u appt confirmed? Yes  - rescheduled her appointment with Dr Wynetta Emery to 11/19/2020. [facility] f/u appt confirmed? Yes  - she was seen by hematology yesterday.  GI - 11/06/2020; EUS 11/20/2020.  Out patient therapy sessions have been scheduled as well as a breast ultrasound 11/12/2020.   Are transportation arrangements needed? she said that scheduling transportation can be a challenge.  reminded her that she has medicaid and can arrange transportation to medical appointments through [facility].  She said she is aware and was told that she needs to schdule rides 10 days in advance.  This CM instucted her to call again to check because DSS does not usually schedule rides more than 7 days in advance.   If their condition worsens, is the pt aware to call  PCP or go to the Emergency Dept.? Yes  Was the patient provided with contact information for the PCP's office or ED? Yes  Was to pt encouraged to call back with questions or concerns? Yes

## 2020-11-05 NOTE — Telephone Encounter (Signed)
I have sent a prescription for Lidoderm patch to her pharmacy
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
## 2020-11-06 ENCOUNTER — Telehealth: Payer: Self-pay | Admitting: Internal Medicine

## 2020-11-06 ENCOUNTER — Other Ambulatory Visit: Payer: Self-pay

## 2020-11-06 ENCOUNTER — Telehealth: Payer: Self-pay

## 2020-11-06 ENCOUNTER — Ambulatory Visit: Payer: Medicaid Other | Admitting: Nurse Practitioner

## 2020-11-06 ENCOUNTER — Encounter: Payer: Self-pay | Admitting: Nurse Practitioner

## 2020-11-06 ENCOUNTER — Telehealth: Payer: Self-pay | Admitting: Nurse Practitioner

## 2020-11-06 VITALS — BP 149/78 | HR 59 | Temp 97.6°F | Ht 64.0 in | Wt 179.6 lb

## 2020-11-06 DIAGNOSIS — R1011 Right upper quadrant pain: Secondary | ICD-10-CM

## 2020-11-06 DIAGNOSIS — K529 Noninfective gastroenteritis and colitis, unspecified: Secondary | ICD-10-CM

## 2020-11-06 DIAGNOSIS — K253 Acute gastric ulcer without hemorrhage or perforation: Secondary | ICD-10-CM

## 2020-11-06 DIAGNOSIS — K269 Duodenal ulcer, unspecified as acute or chronic, without hemorrhage or perforation: Secondary | ICD-10-CM

## 2020-11-06 DIAGNOSIS — K922 Gastrointestinal hemorrhage, unspecified: Secondary | ICD-10-CM

## 2020-11-06 DIAGNOSIS — K921 Melena: Secondary | ICD-10-CM

## 2020-11-06 MED ORDER — OXYCODONE-ACETAMINOPHEN 2.5-325 MG PO TABS: 1.0000 | ORAL_TABLET | Freq: Four times a day (QID) | ORAL | 0 refills | Status: DC | PRN

## 2020-11-06 MED ORDER — OXYCODONE-ACETAMINOPHEN 5-325 MG PO TABS: 1.0000 | ORAL_TABLET | Freq: Four times a day (QID) | ORAL | 0 refills | Status: AC | PRN

## 2020-11-06 NOTE — Telephone Encounter (Signed)
New Rx sent to pharmacy

## 2020-11-06 NOTE — Telephone Encounter (Signed)
Pt was seen today. She is waiting at her pharmacy for the prescription to be sent in. I told her that the nurse was aware and she was waiting on the provider. She said that if she leaves she will have to wait until tomorrow to get the medication and she wants it while she's there. I told her the provider is seeing patients and the nurse is aware.

## 2020-11-06 NOTE — Telephone Encounter (Signed)
Attempted to reach pt again. Message left  Message left on the [facility] number.   Message also sent via My Chart

## 2020-11-06 NOTE — Telephone Encounter (Signed)
-----   Message from Timothy Lasso, RN sent at 10/22/2020  1:13 PM EST ----- Waiting for anti coag response for 11/20/20 from Dr Wynetta Emery

## 2020-11-06 NOTE — Telephone Encounter (Signed)
Updated Rx for 5-325 mg sent to pharmacy

## 2020-11-06 NOTE — Progress Notes (Signed)
Referring Provider: Ladell Pier, MD Primary Care Physician:  Ladell Pier, MD Primary GI:  Dr. Gala Romney  Chief Complaint  Patient presents with  . Abdominal Pain    Right side, constant  . Diarrhea    always    HPI:   Maria Murphy is a 65 y.o. female who presents for hospital follow-up.  Patient was admitted to the hospital 11/01/2020 through 11/03/2020.  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  At presentation she noted several episodes of melena every day along with worsening chronic epigastric abdominal pain radiating to the right upper quadrant.  No bright red blood per rectum or other symptoms have been significant anemia.  Has been compliant with PPI and Carafate, no NSAIDs other than full tearing topical ointment.  Is taking Xarelto 20 mg every day.  She is on iron supplementation.  In the ED hemoglobin found to be 8.7, BUN stable.  Recent EGD on 10/18/2020 at previous hospitalization that showed a few erosions in the gastric body with a superficial gastric ulcer with a clean base in the pylorus and a large duodenal ulcer up to 35 mm in the duodenal bulb with presence of one suture crossing the ulcer.  Patient ended up receiving a total of 1 unit PRBC with improvement in hemoglobin from 7.3-9.8.  Patient noted to be scheduled for upcoming EGD/EUS in [facility] with Dr. Rush Landmark for large ulcer and pancreatic concerns.  Recommended holding Xarelto and consider definitive discontinuation.  Recommended changing Protonix.  Dexilant or Zegerid as an outpatient, continue Carafate every 6 hours.  Avoid all NSAIDs, even  topical.  It appears her EUS is scheduled for 11/20/2020.  Saw oncology for bilateral PE on anticoagulation x 6 months. Recommended holding off on Xarelto until significant improvement in hgb without required transfusion. Hgb checked 11/04/20 and found to be 10.7.   Somewhat recent C. difficile panel 10/17/2020 which was negative.  GI pathogen panel sent on completely normal.   Today she states she's doing ok overall. States cannot tell if any further melena because she's on oral iron. Still with weakness. Has persistent right-sided abdominal pain which is the same pain she's had since perforation and surgical repair. Had vomiting two days ago x 2 episodes, none since. Occurred when pain was "really bad." Denies hematochezia, fever, chills, unintentional weight loss. Still with diarrhea, variable number of stools up to 8-10 stools. Sometimes diarrhea, sometimes "straight water." Seems to be more frequent then a month or two ago. Denies URI or flu-like symptoms. Denies loss of sense of taste or smell. The patient has received COVID-19 vaccination(s). They have had a booster dose as well. Denies chest pain, dyspnea, dizziness, lightheadedness, syncope, near syncope. Denies any other upper or lower GI symptoms.  Xarelto currently held.  Past Medical History:  Diagnosis Date  . Collagen vascular disease ([place])   . Essential hypertension, benign 01/24/2015  . Foot ulcer ([place])   . GERD (gastroesophageal reflux disease)   . Hypertension   . Memory loss   . PUD (peptic ulcer disease)    with perforation s/p exploratory laparotomy  . Pulmonary embolus ([place]) 04/2020  . Skin cancer   . Vision abnormalities     Past Surgical History:  Procedure Laterality Date  . CENTRAL LINE INSERTION Right 04/22/2020   Procedure: CENTRAL LINE INSERTION;  Surgeon: Virl Cagey, MD;  Location: [facility];  Service: General;  Laterality: Right;  . DIAGNOSTIC LAPAROSCOPY    . ESOPHAGOGASTRODUODENOSCOPY (EGD) WITH  PROPOFOL N/A 07/10/2020   Procedure: ESOPHAGOGASTRODUODENOSCOPY (EGD) WITH PROPOFOL;  Surgeon: Rogene Houston, MD;  Location: [facility];  Service: Endoscopy;  Laterality: N/A;  . ESOPHAGOGASTRODUODENOSCOPY (EGD) WITH PROPOFOL N/A 10/18/2020   Procedure: ESOPHAGOGASTRODUODENOSCOPY (EGD) WITH PROPOFOL;  Surgeon: Harvel Quale, MD;  Location: [facility];  Service: Gastroenterology;  Laterality: N/A;  . GASTRORRHAPHY  04/22/2020   Procedure: GASTRORRHAPHY;  Surgeon: Virl Cagey, MD;  Location: [facility];  Service: General;;  . IR CATHETER TUBE CHANGE  05/09/2020  . LAPAROTOMY N/A 04/22/2020   Procedure: EXPLORATORY LAPAROTOMY;  Surgeon: Virl Cagey, MD;  Location: [facility];  Service: General;  Laterality: N/A;  . TONSILLECTOMY      Current Outpatient Medications  Medication Sig Dispense Refill  . acetaminophen (TYLENOL) 500 MG tablet Take 1 tablet (500 mg total) by mouth every 6 (six) hours as needed for mild pain or fever. (Patient taking differently: Take 500 mg by mouth as needed for mild pain or fever.)    . amLODipine (NORVASC) 2.5 MG tablet Take 1 tablet (2.5 mg total) by mouth daily. For BP 90 tablet 3  . donepezil (ARICEPT) 10 MG tablet Take 0.5-1 tablets (5-10 mg total) by mouth in the morning and at bedtime. 30 tablet 2  . ferrous sulfate 325 (65 FE) MG tablet Take 1 tablet (325 mg total) by mouth daily with breakfast. 30 tablet 5  . gabapentin (NEURONTIN) 300 MG capsule Take 1 capsule (300 mg total) by mouth 2 (two) times daily. 60 capsule 6  . lipase/protease/amylase (CREON) 36000 UNITS CPEP capsule Take 2 capsules (72,000 Units total) by mouth 3 (three) times daily with meals. May also take 1 capsule (36,000 Units total) as needed (with snacks). 240 capsule 11  . mirtazapine (REMERON) 15 MG tablet Take 1 tablet (15 mg total) by mouth at bedtime. 30 tablet 2  . ondansetron (ZOFRAN ODT) 8 MG disintegrating tablet Take 1 tablet (8 mg total) by mouth every 8  (eight) hours as needed for nausea or vomiting. 30 tablet 1  . pantoprazole (PROTONIX) 40 MG tablet Take 1 tablet (40 mg total) by mouth 2 (two) times daily. 60 tablet 6  . sucralfate (CARAFATE) 1 GM/10ML suspension Take 10 mLs (1 g total) by mouth 4 (four) times daily -  with meals and at bedtime. 420 mL 2  . [START ON 11/08/2020] rivaroxaban (XARELTO) 10 MG TABS tablet Take 1 tablet (10 mg total) by mouth every evening. (Patient not taking: No sig reported) 30 tablet 2   No current facility-administered medications for this visit.    Allergies as of 11/06/2020 - Review Complete 11/06/2020  Allergen Reaction Noted  . Wheat extract Swelling 10/29/2017    Family History  Problem Relation Age of Onset  . High Cholesterol Mother   . Hypertension Mother   . Arthritis/Rheumatoid Mother   . Alcohol abuse Father   . Hypertension Maternal Grandmother   . Colon cancer Neg Hx   . Colon polyps Neg Hx   . Inflammatory bowel disease Neg Hx     Social History   Socioeconomic History  . Marital status: Widowed    Spouse name: Not on file  . Number of children: 2  . Years of  education: Not on file  . Highest education level: Not on file  Occupational History  . Not on file  Tobacco Use  . Smoking status: Never Smoker  . Smokeless tobacco: Never Used  Vaping Use  . Vaping Use: Never used  Substance and Sexual Activity  . Alcohol use: Yes    Alcohol/week: 2.0 - 3.0 standard drinks    Types: 2 - 3 Glasses of wine per week  . Drug use: No  . Sexual activity: Not Currently    Birth control/protection: Post-menopausal  Other Topics Concern  . Not on file  Social History Narrative  . Not on file   Social Determinants of Health   Financial Resource Strain: Not on file  Food Insecurity: Not on file  Transportation Needs: Not on file  Physical Activity: Not on file  Stress: Not on file  Social Connections: Not on file    Subjective: Review of Systems  Constitutional: Negative for  chills, fever, malaise/fatigue and weight loss.  HENT: Negative for congestion and sore throat.   Respiratory: Negative for cough and shortness of breath.   Cardiovascular: Negative for chest pain and palpitations.  Gastrointestinal: Positive for abdominal pain, diarrhea, melena (history of; current dark stools on iron), nausea and vomiting. Negative for blood in stool, constipation and heartburn.  Musculoskeletal: Negative for joint pain and myalgias.  Skin: Negative for rash.  Neurological: Negative for dizziness and weakness.  Endo/Heme/Allergies: Does not bruise/bleed easily.  Psychiatric/Behavioral: Negative for depression. The patient is not nervous/anxious.   All other systems reviewed and are negative.    Objective: BP (!) 149/78   Pulse (!) 59   Temp 97.6 F (36.4 C)   Ht 5\' 4"  (1.626 m)   Wt 179 lb 9.6 oz (81.5 kg)   BMI 30.83 kg/m  Physical Exam Vitals and nursing note reviewed.  Constitutional:      General: She is not in acute distress.    Appearance: Normal appearance. She is well-developed. She is obese. She is not ill-appearing, toxic-appearing or diaphoretic.  HENT:     Head: Normocephalic and atraumatic.     Nose: No congestion or rhinorrhea.  Eyes:     General: No scleral icterus. Cardiovascular:     Rate and Rhythm: Normal rate and regular rhythm.     Heart sounds: Normal heart sounds.  Pulmonary:     Effort: Pulmonary effort is normal. No respiratory distress.     Breath sounds: Normal breath sounds.  Abdominal:     General: Bowel sounds are normal.     Palpations: Abdomen is soft. There is no hepatomegaly, splenomegaly or mass.     Tenderness: There is abdominal tenderness in the right upper quadrant. There is no guarding or rebound.     Hernia: No hernia is present.  Skin:    General: Skin is warm and dry.     Coloration: Skin is not jaundiced.     Findings: No rash.  Neurological:     General: No focal deficit present.     Mental Status: She  is alert and oriented to person, place, and time.  Psychiatric:        Attention and Perception: Attention normal.        Mood and Affect: Mood normal.        Speech: Speech normal.        Behavior: Behavior normal.        Thought Content: Thought content normal.        Cognition  and Memory: Cognition and memory normal.      Assessment:  65 year old female presents for hospital follow-up.  She has had multiple admissions for anemia with known perforated duodenal ulcer that seems to be persistent, as outlined in HPI.  She has significant bleeding requiring transfusion recently in the hospital.  She is seen oncology since follow-up and they have recommended she continue to hold her Xarelto until stabilized hemoglobin without transfusion.  She also has had an abnormal appearing pancreas, query sidebranch IPMN and she is currently scheduled for EGD/EUS at the end of March in [place].  Today she presents with ongoing, chronic diarrhea.  Also with chronic, ongoing abdominal pain and asking for pain medications.  Given her recent hospital admissions for this I do not feel it is unreasonable.  She feels her stools are little more frequent recently and I will check stool studies due to her recent hospital admission.  We have worked this up significantly in the past as well.  She cannot tell she still having melena as she takes oral iron.  She does still have persistent weakness.  Her hemoglobin was checked 2 days ago and found to be improved.  I think the best thing for her at this point is to keep her appointment for EUS and EGD with further evaluation.  Our endoscopy is here has mentioned that he will reach out to the endoscopist in [place] to make him aware of the size of the ulcer that seems to be persistent.  We will plan for follow-up in our office in 2 months after endoscopic evaluation   Plan: 1. Check CBC in 2 weeks and again in 4 weeks 2. Keep EUS appointment in [place] 3. Notify us of  any worsening 4. Stools for C. difficile and GI pathogen panel 5. Prescription for Percocet 2.5-325 every 6 hours as needed was sent to the pharmacy, 5-day supply prescribed 6. Advised the patient to stop using over-the-counter Tylenol while taking Percocet 7. Follow-up in 2 months    Thank you for allowing [place] to participate in the care of Maria Murphy  Walden Field, DNP, AGNP-C Adult & Gerontological Nurse Practitioner [facility]   11/06/2020 10:38 AM   Disclaimer: This note was dictated with voice recognition software. Similar sounding words can inadvertently be transcribed and may not be corrected upon review.

## 2020-11-06 NOTE — Telephone Encounter (Signed)
See 10/17/20 note regarding Xarelto hold for 48 hours prior to the procedure

## 2020-11-06 NOTE — Telephone Encounter (Signed)
Noted  

## 2020-11-06 NOTE — Progress Notes (Signed)
CC'ED TO PCP 

## 2020-11-06 NOTE — Telephone Encounter (Signed)
Corrected dosage of medication has been sent in to pts pharmacy by Walden Field NP

## 2020-11-06 NOTE — Telephone Encounter (Signed)
[facility] does not Rx dosage you sent in but they do have Percocet 5 325mg . They stated to send in a new Rx or what will you have them to do. Please advise

## 2020-11-06 NOTE — Patient Instructions (Signed)
Your health issues we discussed today were:   Worsening diarrhea: 1. Complete your stool studies when you are able to 2. We will call you when we get the results 3. If you do not have an infection we can give you recommendations for medication to help control your stools  Abdominal pain with known ulcers: 1. You should strictly avoid all NSAIDs (ibuprofen, Motrin, Advil, Aleve, naproxen, Naprosyn, aspirin, Goody powders, BC powders, anything with "NSAID" on the container) 2. I sent in a prescription for pain medication that she can take up to every 6 hours as needed for pain 3. Because the pain medicine prescription has Tylenol in it,STOP USING over-the-counter Tylenol while you are using this medication  Abnormal imaging of your pancreas and ongoing ulcer: 1. Keep your appointment for your EUS with Dr. Rush Landmark in [place] 2. Call us if you have any problems  Worsening anemia: 1. Have your blood cell count checked in 2 weeks and again in 4 weeks 2. I have put in orders for these 3. Call us if you have any worsening symptoms 4. Alternatively you could proceed to the emergency department if you have worsening bleeding or symptoms that you are losing a lot of blood  Overall I recommend:  1. Continue other current medications 2. Return for follow-up in 2 months 3. Call us for any questions or concerns   ---------------------------------------------------------------  I am glad you have gotten your COVID-19 vaccination!  Even though you are fully vaccinated you should continue to follow CDC and state/local guidelines.  ---------------------------------------------------------------   At [facility] we value your feedback. You may receive a survey about your visit today. Please share your experience as we strive to create trusting relationships with our patients to provide genuine, compassionate, quality care.  We appreciate your understanding and patience as we  review any laboratory studies, imaging, and other diagnostic tests that are ordered as we care for you. Our office policy is 5 business days for review of these results, and any emergent or urgent results are addressed in a timely manner for your best interest. If you do not hear from our office in 1 week, please contact us.   We also encourage the use of MyChart, which contains your medical information for your review as well. If you are not enrolled in this feature, an access code is on this after visit summary for your convenience. Thank you for allowing [place] to be involved in your care.  It was great to see you today!  I hope you have a great spring!!

## 2020-11-06 NOTE — Telephone Encounter (Signed)
Left message on machine to call back  

## 2020-11-07 ENCOUNTER — Other Ambulatory Visit: Payer: Self-pay

## 2020-11-07 ENCOUNTER — Ambulatory Visit (HOSPITAL_COMMUNITY): Payer: Medicaid Other

## 2020-11-07 ENCOUNTER — Ambulatory Visit (HOSPITAL_COMMUNITY): Payer: Medicaid Other | Admitting: Clinical

## 2020-11-07 ENCOUNTER — Encounter (HOSPITAL_COMMUNITY): Payer: Self-pay

## 2020-11-07 DIAGNOSIS — M25562 Pain in left knee: Secondary | ICD-10-CM

## 2020-11-07 DIAGNOSIS — M25661 Stiffness of right knee, not elsewhere classified: Secondary | ICD-10-CM

## 2020-11-07 DIAGNOSIS — M25662 Stiffness of left knee, not elsewhere classified: Secondary | ICD-10-CM

## 2020-11-07 DIAGNOSIS — M25561 Pain in right knee: Secondary | ICD-10-CM | POA: Diagnosis not present

## 2020-11-07 DIAGNOSIS — M6281 Muscle weakness (generalized): Secondary | ICD-10-CM

## 2020-11-07 LAB — MULTIPLE MYELOMA PANEL, SERUM
Albumin SerPl Elph-Mcnc: 3.8 g/dL (ref 2.9–4.4)
Albumin/Glob SerPl: 1.3 (ref 0.7–1.7)
Alpha 1: 0.3 g/dL (ref 0.0–0.4)
Alpha2 Glob SerPl Elph-Mcnc: 0.8 g/dL (ref 0.4–1.0)
B-Globulin SerPl Elph-Mcnc: 1 g/dL (ref 0.7–1.3)
Gamma Glob SerPl Elph-Mcnc: 1 g/dL (ref 0.4–1.8)
Globulin, Total: 3.1 g/dL (ref 2.2–3.9)
IgA: 92 mg/dL (ref 87–352)
IgG (Immunoglobin G), Serum: 1006 mg/dL (ref 586–1602)
IgM (Immunoglobulin M), Srm: 70 mg/dL (ref 26–217)
Total Protein ELP: 6.9 g/dL (ref 6.0–8.5)

## 2020-11-07 NOTE — Patient Instructions (Signed)
Bridge    Lie back, legs bent. Inhale, pressing hips up. Keeping ribs in, lengthen lower back. Exhale, rolling down along spine from top. Repeat 15 times. Do 2 sessions per day.  http://pm.exer.us/55   Copyright  VHI. All rights reserved.   Heel Slides    Squeeze pelvic floor and hold. Slide left heel along bed towards bottom. Hold for ___ seconds. Slide back to flat knee position. Repeat ___ times. Do ___ times a day. Repeat with other leg.    Copyright  VHI. All rights reserved.

## 2020-11-07 NOTE — Telephone Encounter (Signed)
The pt was contacted regarding holding her Xarelto prior to her upcoming procedure.  She tells me that she has been taken off of blood thinner at this time.  She will call back if there are any changes prior to appt.
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
## 2020-11-10 ENCOUNTER — Other Ambulatory Visit: Payer: Self-pay

## 2020-11-10 ENCOUNTER — Emergency Department (HOSPITAL_COMMUNITY): Payer: Medicaid Other

## 2020-11-10 ENCOUNTER — Emergency Department (HOSPITAL_COMMUNITY)
Admission: EM | Admit: 2020-11-10 | Discharge: 2020-11-10 | Disposition: A | Discharge status: Home / Self Care | Payer: Medicaid Other | Attending: Emergency Medicine | Admitting: Emergency Medicine

## 2020-11-10 ENCOUNTER — Encounter (HOSPITAL_COMMUNITY): Payer: Self-pay | Admitting: Emergency Medicine

## 2020-11-10 DIAGNOSIS — I10 Essential (primary) hypertension: Secondary | ICD-10-CM | POA: Diagnosis not present

## 2020-11-10 DIAGNOSIS — R0781 Pleurodynia: Secondary | ICD-10-CM | POA: Insufficient documentation

## 2020-11-10 DIAGNOSIS — Z7901 Long term (current) use of anticoagulants: Secondary | ICD-10-CM | POA: Diagnosis not present

## 2020-11-10 DIAGNOSIS — K219 Gastro-esophageal reflux disease without esophagitis: Secondary | ICD-10-CM | POA: Insufficient documentation

## 2020-11-10 DIAGNOSIS — R109 Unspecified abdominal pain: Secondary | ICD-10-CM | POA: Insufficient documentation

## 2020-11-10 DIAGNOSIS — Z85828 Personal history of other malignant neoplasm of skin: Secondary | ICD-10-CM | POA: Diagnosis not present

## 2020-11-10 DIAGNOSIS — Z79899 Other long term (current) drug therapy: Secondary | ICD-10-CM | POA: Diagnosis not present

## 2020-11-10 DIAGNOSIS — R0789 Other chest pain: Secondary | ICD-10-CM

## 2020-11-10 LAB — COMPREHENSIVE METABOLIC PANEL
ALT: 7 U/L (ref 0–44)
AST: 19 U/L (ref 15–41)
Albumin: 4 g/dL (ref 3.5–5.0)
Alkaline Phosphatase: 48 U/L (ref 38–126)
Anion gap: 8 (ref 5–15)
BUN: 12 mg/dL (ref 8–23)
CO2: 23 mmol/L (ref 22–32)
Calcium: 9 mg/dL (ref 8.9–10.3)
Chloride: 109 mmol/L (ref 98–111)
Creatinine, Ser: 0.66 mg/dL (ref 0.44–1.00)
GFR, Estimated: >60 mL/min (ref >60)
Glucose, Bld: 122 mg/dL — ABNORMAL HIGH (ref 70–99)
Potassium: 3.8 mmol/L (ref 3.5–5.1)
Sodium: 140 mmol/L (ref 135–145)
Total Bilirubin: 0.4 mg/dL (ref 0.3–1.2)
Total Protein: 6.9 g/dL (ref 6.5–8.1)

## 2020-11-10 LAB — CBC WITH DIFFERENTIAL/PLATELET
Abs Immature Granulocytes: 0.02 10*3/uL (ref 0.00–0.07)
Basophils Absolute: 0 10*3/uL (ref 0.0–0.1)
Basophils Relative: 1 %
Eosinophils Absolute: 0.1 10*3/uL (ref 0.0–0.5)
Eosinophils Relative: 2 %
HCT: 31.6 % — ABNORMAL LOW (ref 36.0–46.0)
Hemoglobin: 10.2 g/dL — ABNORMAL LOW (ref 12.0–15.0)
Immature Granulocytes: 0 %
Lymphocytes Relative: 22 %
Lymphs Abs: 1.4 10*3/uL (ref 0.7–4.0)
MCH: 28.1 pg (ref 26.0–34.0)
MCHC: 32.3 g/dL (ref 30.0–36.0)
MCV: 87.1 fL (ref 80.0–100.0)
Monocytes Absolute: 0.5 10*3/uL (ref 0.1–1.0)
Monocytes Relative: 8 %
Neutro Abs: 4.3 10*3/uL (ref 1.7–7.7)
Neutrophils Relative %: 67 %
Platelets: 216 10*3/uL (ref 150–400)
RBC: 3.63 MIL/uL — ABNORMAL LOW (ref 3.87–5.11)
RDW: 16 % — ABNORMAL HIGH (ref 11.5–15.5)
WBC: 6.4 10*3/uL (ref 4.0–10.5)
nRBC: 0 % (ref 0.0–0.2)

## 2020-11-10 LAB — LIPASE, BLOOD: Lipase: 29 U/L (ref 11–51)

## 2020-11-10 MED ORDER — IOHEXOL 350 MG/ML SOLN
100.0000 mL | Freq: Once | INTRAVENOUS | Status: AC | PRN
  Administered 2020-11-10: 100 mL via INTRAVENOUS

## 2020-11-10 MED ORDER — ONDANSETRON HCL 4 MG/2ML IJ SOLN
4.0000 mg | Freq: Once | INTRAMUSCULAR | Status: AC
  Administered 2020-11-10: 4 mg via INTRAVENOUS
  Filled 2020-11-10: qty 2

## 2020-11-10 MED ORDER — MORPHINE SULFATE (PF) 4 MG/ML IV SOLN
4.0000 mg | Freq: Once | INTRAVENOUS | Status: AC
  Administered 2020-11-10: 4 mg via INTRAVENOUS
  Filled 2020-11-10: qty 1

## 2020-11-10 MED ORDER — SODIUM CHLORIDE 0.9 % IV BOLUS
1000.0000 mL | Freq: Once | INTRAVENOUS | Status: AC
  Administered 2020-11-10: 1000 mL via INTRAVENOUS
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
## 2020-11-10 NOTE — Discharge Instructions (Addendum)
Continue medications as previously prescribed.  Follow-up with your primary doctor later this week if your symptoms do not improve.

## 2020-11-10 NOTE — ED Triage Notes (Signed)
Pt c/o right flank/rib pain last night.

## 2020-11-11 ENCOUNTER — Ambulatory Visit (HOSPITAL_COMMUNITY): Payer: Medicaid Other | Admitting: Physical Therapy

## 2020-11-11 ENCOUNTER — Other Ambulatory Visit: Payer: Self-pay

## 2020-11-11 ENCOUNTER — Emergency Department (HOSPITAL_COMMUNITY)
Admission: EM | Admit: 2020-11-11 | Discharge: 2020-11-11 | Disposition: A | Discharge status: Home / Self Care | Payer: Medicaid Other | Attending: Emergency Medicine | Admitting: Emergency Medicine

## 2020-11-11 ENCOUNTER — Encounter (HOSPITAL_COMMUNITY): Payer: Self-pay | Admitting: Emergency Medicine

## 2020-11-11 ENCOUNTER — Encounter (HOSPITAL_COMMUNITY): Payer: Self-pay | Admitting: Physical Therapy

## 2020-11-11 ENCOUNTER — Emergency Department (HOSPITAL_COMMUNITY): Payer: Medicaid Other

## 2020-11-11 DIAGNOSIS — Z79899 Other long term (current) drug therapy: Secondary | ICD-10-CM | POA: Diagnosis not present

## 2020-11-11 DIAGNOSIS — R1011 Right upper quadrant pain: Secondary | ICD-10-CM | POA: Insufficient documentation

## 2020-11-11 DIAGNOSIS — Z85828 Personal history of other malignant neoplasm of skin: Secondary | ICD-10-CM | POA: Diagnosis not present

## 2020-11-11 DIAGNOSIS — Z7901 Long term (current) use of anticoagulants: Secondary | ICD-10-CM | POA: Insufficient documentation

## 2020-11-11 DIAGNOSIS — M25561 Pain in right knee: Secondary | ICD-10-CM | POA: Diagnosis not present

## 2020-11-11 DIAGNOSIS — R1084 Generalized abdominal pain: Secondary | ICD-10-CM | POA: Diagnosis not present

## 2020-11-11 DIAGNOSIS — R1031 Right lower quadrant pain: Secondary | ICD-10-CM | POA: Diagnosis present

## 2020-11-11 DIAGNOSIS — M25661 Stiffness of right knee, not elsewhere classified: Secondary | ICD-10-CM

## 2020-11-11 DIAGNOSIS — M25662 Stiffness of left knee, not elsewhere classified: Secondary | ICD-10-CM

## 2020-11-11 DIAGNOSIS — I10 Essential (primary) hypertension: Secondary | ICD-10-CM | POA: Diagnosis not present

## 2020-11-11 DIAGNOSIS — G8929 Other chronic pain: Secondary | ICD-10-CM | POA: Diagnosis not present

## 2020-11-11 DIAGNOSIS — K219 Gastro-esophageal reflux disease without esophagitis: Secondary | ICD-10-CM | POA: Diagnosis not present

## 2020-11-11 DIAGNOSIS — M25562 Pain in left knee: Secondary | ICD-10-CM

## 2020-11-11 DIAGNOSIS — M6281 Muscle weakness (generalized): Secondary | ICD-10-CM

## 2020-11-11 LAB — COMPREHENSIVE METABOLIC PANEL
ALT: 8 U/L (ref 0–44)
AST: 17 U/L (ref 15–41)
Albumin: 3.7 g/dL (ref 3.5–5.0)
Alkaline Phosphatase: 46 U/L (ref 38–126)
Anion gap: 8 (ref 5–15)
BUN: 7 mg/dL — ABNORMAL LOW (ref 8–23)
CO2: 24 mmol/L (ref 22–32)
Calcium: 8.8 mg/dL — ABNORMAL LOW (ref 8.9–10.3)
Chloride: 106 mmol/L (ref 98–111)
Creatinine, Ser: 0.64 mg/dL (ref 0.44–1.00)
GFR, Estimated: >60 mL/min (ref >60)
Glucose, Bld: 139 mg/dL — ABNORMAL HIGH (ref 70–99)
Potassium: 3.7 mmol/L (ref 3.5–5.1)
Sodium: 138 mmol/L (ref 135–145)
Total Bilirubin: 0.5 mg/dL (ref 0.3–1.2)
Total Protein: 6.5 g/dL (ref 6.5–8.1)

## 2020-11-11 LAB — CBC WITH DIFFERENTIAL/PLATELET
Abs Immature Granulocytes: 0.03 10*3/uL (ref 0.00–0.07)
Basophils Absolute: 0 10*3/uL (ref 0.0–0.1)
Basophils Relative: 0 %
Eosinophils Absolute: 0 10*3/uL (ref 0.0–0.5)
Eosinophils Relative: 0 %
HCT: 31 % — ABNORMAL LOW (ref 36.0–46.0)
Hemoglobin: 10.1 g/dL — ABNORMAL LOW (ref 12.0–15.0)
Immature Granulocytes: 0 %
Lymphocytes Relative: 12 %
Lymphs Abs: 0.9 10*3/uL (ref 0.7–4.0)
MCH: 28.2 pg (ref 26.0–34.0)
MCHC: 32.6 g/dL (ref 30.0–36.0)
MCV: 86.6 fL (ref 80.0–100.0)
Monocytes Absolute: 0.4 10*3/uL (ref 0.1–1.0)
Monocytes Relative: 5 %
Neutro Abs: 6.7 10*3/uL (ref 1.7–7.7)
Neutrophils Relative %: 83 %
Platelets: 209 10*3/uL (ref 150–400)
RBC: 3.58 MIL/uL — ABNORMAL LOW (ref 3.87–5.11)
RDW: 15.6 % — ABNORMAL HIGH (ref 11.5–15.5)
WBC: 8.1 10*3/uL (ref 4.0–10.5)
nRBC: 0 % (ref 0.0–0.2)

## 2020-11-11 LAB — PROTHROMBIN GENE MUTATION

## 2020-11-11 LAB — FACTOR 5 LEIDEN

## 2020-11-11 LAB — LIPASE, BLOOD: Lipase: 39 U/L (ref 11–51)

## 2020-11-11 MED ORDER — HYDROMORPHONE HCL 1 MG/ML IJ SOLN
1.0000 mg | Freq: Once | INTRAMUSCULAR | Status: AC
  Administered 2020-11-11: 1 mg via INTRAVENOUS
  Filled 2020-11-11: qty 1

## 2020-11-11 MED ORDER — LACTATED RINGERS IV BOLUS
1000.0000 mL | Freq: Once | INTRAVENOUS | Status: AC
  Administered 2020-11-11: 1000 mL via INTRAVENOUS

## 2020-11-11 MED ORDER — PROMETHAZINE HCL 25 MG RE SUPP: 25.0000 mg | Freq: Four times a day (QID) | RECTAL | 0 refills | Status: DC | PRN

## 2020-11-11 MED ORDER — FAMOTIDINE 20 MG PO TABS: 20.0000 mg | ORAL_TABLET | Freq: Two times a day (BID) | ORAL | 0 refills | Status: DC

## 2020-11-11 MED ORDER — FAMOTIDINE IN NACL 20-0.9 MG/50ML-% IV SOLN
20.0000 mg | Freq: Once | INTRAVENOUS | Status: AC
  Administered 2020-11-11: 20 mg via INTRAVENOUS
  Filled 2020-11-11: qty 50

## 2020-11-11 MED ORDER — ONDANSETRON 8 MG PO TBDP
8.0000 mg | ORAL_TABLET | Freq: Once | ORAL | Status: AC
  Administered 2020-11-11: 8 mg via ORAL
  Filled 2020-11-11: qty 1

## 2020-11-11 MED ORDER — PROMETHAZINE HCL 25 MG PO TABS: 25.0000 mg | ORAL_TABLET | Freq: Four times a day (QID) | ORAL | 0 refills | Status: DC | PRN

## 2020-11-11 MED ORDER — METOCLOPRAMIDE HCL 5 MG/ML IJ SOLN
10.0000 mg | Freq: Once | INTRAMUSCULAR | Status: AC
  Administered 2020-11-11: 10 mg via INTRAVENOUS
  Filled 2020-11-11: qty 2

## 2020-11-11 NOTE — Discharge Instructions (Addendum)
If you develop worsening, continued, or recurrent abdominal pain, uncontrolled vomiting, fever, chest or back pain, or any other new/concerning symptoms then return to the ER for evaluation.  
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
## 2020-11-11 NOTE — ED Triage Notes (Signed)
Pt with c/o abdominal pain and vomiting. States she hasn't been able to eat in 2 days. Pt seen here Saturday night.
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
## 2020-11-12 ENCOUNTER — Telehealth: Payer: Self-pay | Admitting: *Deleted

## 2020-11-12 ENCOUNTER — Ambulatory Visit (HOSPITAL_COMMUNITY): Payer: Medicaid Other

## 2020-11-12 ENCOUNTER — Ambulatory Visit (HOSPITAL_COMMUNITY)
Admission: RE | Admit: 2020-11-12 | Discharge: 2020-11-12 | Disposition: A | Discharge status: Home / Self Care | Payer: Medicaid Other | Source: Ambulatory Visit | Attending: Internal Medicine | Admitting: Internal Medicine

## 2020-11-12 DIAGNOSIS — Z1231 Encounter for screening mammogram for malignant neoplasm of breast: Secondary | ICD-10-CM | POA: Insufficient documentation

## 2020-11-12 NOTE — Telephone Encounter (Signed)
Transition Care Management Unsuccessful Follow-up Telephone Call  Date of discharge and from where:  11-11-2020 Maria Murphy  Attempts:  1st  Reason for unsuccessful TCM follow-up call:  No answer/left message

## 2020-11-13 ENCOUNTER — Telehealth: Payer: Self-pay | Admitting: *Deleted

## 2020-11-13 NOTE — Telephone Encounter (Signed)
Transition Care Management Unsuccessful Follow-up Telephone Call  Date of discharge and from where:  11/11/2020 - [name]  Attempts:  2nd Attempt  Reason for unsuccessful TCM follow-up call:  Left voice message

## 2020-11-14 ENCOUNTER — Inpatient Hospital Stay (HOSPITAL_COMMUNITY)
Admission: EM | Admit: 2020-11-14 | Discharge: 2020-11-18 | DRG: 384 | Disposition: A | Discharge status: Home / Self Care | Payer: Medicaid Other | Attending: Internal Medicine | Admitting: Internal Medicine

## 2020-11-14 ENCOUNTER — Ambulatory Visit (HOSPITAL_COMMUNITY): Payer: Medicaid Other | Admitting: Physical Therapy

## 2020-11-14 ENCOUNTER — Encounter (HOSPITAL_COMMUNITY): Payer: Medicaid Other | Admitting: Physical Therapy

## 2020-11-14 ENCOUNTER — Encounter (HOSPITAL_COMMUNITY): Payer: Self-pay | Admitting: *Deleted

## 2020-11-14 ENCOUNTER — Other Ambulatory Visit: Payer: Self-pay

## 2020-11-14 ENCOUNTER — Emergency Department (HOSPITAL_COMMUNITY): Payer: Medicaid Other

## 2020-11-14 DIAGNOSIS — Z6830 Body mass index (BMI) 30.0-30.9, adult: Secondary | ICD-10-CM

## 2020-11-14 DIAGNOSIS — Z8249 Family history of ischemic heart disease and other diseases of the circulatory system: Secondary | ICD-10-CM

## 2020-11-14 DIAGNOSIS — K267 Chronic duodenal ulcer without hemorrhage or perforation: Secondary | ICD-10-CM | POA: Diagnosis present

## 2020-11-14 DIAGNOSIS — E669 Obesity, unspecified: Secondary | ICD-10-CM | POA: Diagnosis present

## 2020-11-14 DIAGNOSIS — Z86711 Personal history of pulmonary embolism: Secondary | ICD-10-CM | POA: Diagnosis present

## 2020-11-14 DIAGNOSIS — K5900 Constipation, unspecified: Secondary | ICD-10-CM | POA: Diagnosis present

## 2020-11-14 DIAGNOSIS — Z85828 Personal history of other malignant neoplasm of skin: Secondary | ICD-10-CM

## 2020-11-14 DIAGNOSIS — Z79899 Other long term (current) drug therapy: Secondary | ICD-10-CM

## 2020-11-14 DIAGNOSIS — D649 Anemia, unspecified: Secondary | ICD-10-CM

## 2020-11-14 DIAGNOSIS — R112 Nausea with vomiting, unspecified: Secondary | ICD-10-CM

## 2020-11-14 DIAGNOSIS — I10 Essential (primary) hypertension: Secondary | ICD-10-CM | POA: Diagnosis present

## 2020-11-14 DIAGNOSIS — K2980 Duodenitis without bleeding: Secondary | ICD-10-CM | POA: Diagnosis present

## 2020-11-14 DIAGNOSIS — K8020 Calculus of gallbladder without cholecystitis without obstruction: Secondary | ICD-10-CM | POA: Diagnosis present

## 2020-11-14 DIAGNOSIS — R109 Unspecified abdominal pain: Secondary | ICD-10-CM | POA: Diagnosis present

## 2020-11-14 DIAGNOSIS — Z20822 Contact with and (suspected) exposure to covid-19: Secondary | ICD-10-CM | POA: Diagnosis present

## 2020-11-14 DIAGNOSIS — R739 Hyperglycemia, unspecified: Secondary | ICD-10-CM | POA: Diagnosis present

## 2020-11-14 DIAGNOSIS — G8929 Other chronic pain: Secondary | ICD-10-CM | POA: Diagnosis present

## 2020-11-14 DIAGNOSIS — R52 Pain, unspecified: Secondary | ICD-10-CM | POA: Diagnosis present

## 2020-11-14 DIAGNOSIS — E876 Hypokalemia: Secondary | ICD-10-CM | POA: Diagnosis present

## 2020-11-14 DIAGNOSIS — Z79891 Long term (current) use of opiate analgesic: Secondary | ICD-10-CM

## 2020-11-14 DIAGNOSIS — K219 Gastro-esophageal reflux disease without esophagitis: Secondary | ICD-10-CM | POA: Diagnosis present

## 2020-11-14 DIAGNOSIS — Z23 Encounter for immunization: Secondary | ICD-10-CM

## 2020-11-14 DIAGNOSIS — Z83438 Family history of other disorder of lipoprotein metabolism and other lipidemia: Secondary | ICD-10-CM

## 2020-11-14 DIAGNOSIS — Z86718 Personal history of other venous thrombosis and embolism: Secondary | ICD-10-CM

## 2020-11-14 DIAGNOSIS — K8689 Other specified diseases of pancreas: Secondary | ICD-10-CM | POA: Diagnosis present

## 2020-11-14 DIAGNOSIS — Z7901 Long term (current) use of anticoagulants: Secondary | ICD-10-CM

## 2020-11-14 DIAGNOSIS — K257 Chronic gastric ulcer without hemorrhage or perforation: Principal | ICD-10-CM | POA: Diagnosis present

## 2020-11-14 DIAGNOSIS — Z87442 Personal history of urinary calculi: Secondary | ICD-10-CM

## 2020-11-14 DIAGNOSIS — F32A Depression, unspecified: Secondary | ICD-10-CM | POA: Diagnosis present

## 2020-11-14 DIAGNOSIS — D62 Acute posthemorrhagic anemia: Secondary | ICD-10-CM | POA: Diagnosis present

## 2020-11-14 MED ORDER — HYDROMORPHONE HCL 1 MG/ML IJ SOLN
1.0000 mg | Freq: Once | INTRAMUSCULAR | Status: AC
  Administered 2020-11-14: 1 mg via INTRAVENOUS
  Filled 2020-11-14: qty 1

## 2020-11-14 MED ORDER — ONDANSETRON HCL 4 MG/2ML IJ SOLN
4.0000 mg | Freq: Once | INTRAMUSCULAR | Status: AC
  Administered 2020-11-14: 4 mg via INTRAVENOUS
  Filled 2020-11-14: qty 2

## 2020-11-14 MED ORDER — LACTATED RINGERS IV BOLUS
1000.0000 mL | Freq: Once | INTRAVENOUS | Status: AC
  Administered 2020-11-14: 1000 mL via INTRAVENOUS
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
## 2020-11-14 NOTE — ED Notes (Signed)
Pt transported to CT ?

## 2020-11-14 NOTE — Telephone Encounter (Signed)
Transition Care Management Follow-up Telephone Call  Date of discharge and from where: 11/11/2020 - [name]  How have you been since you were released from the hospital? "I'm fine"  Any questions or concerns? No  Items Reviewed:  Did the pt receive and understand the discharge instructions provided? Yes   Medications obtained and verified? Yes   Other? No   Any new allergies since your discharge? No   Dietary orders reviewed? No  Do you have support at home? Yes    Functional Questionnaire: (I = Independent and D = Dependent) ADLs: I  Bathing/Dressing- I  Meal Prep- I  Eating- I  Maintaining continence- I  Transferring/Ambulation- I  Managing Meds- I  Follow up appointments reviewed:   PCP Hospital f/u appt confirmed? Yes  Scheduled to see Dr. Wynetta Emery on 11/19/2020 @ 1050.  [facility] f/u appt confirmed? No    Are transportation arrangements needed? No   If their condition worsens, is the pt aware to call PCP or go to the Emergency Dept.? Yes  Was the patient provided with contact information for the PCP's office or ED? Yes  Was to pt encouraged to call back with questions or concerns? Yes

## 2020-11-14 NOTE — ED Triage Notes (Signed)
Pt with right flank pain that woke pt up this morning.  Pain all day with nausea.  Pt with hx of kidney stones.  Denies any blood in urine.

## 2020-11-15 ENCOUNTER — Observation Stay (HOSPITAL_COMMUNITY): Payer: Medicaid Other

## 2020-11-15 DIAGNOSIS — Z23 Encounter for immunization: Secondary | ICD-10-CM | POA: Diagnosis not present

## 2020-11-15 DIAGNOSIS — Z83438 Family history of other disorder of lipoprotein metabolism and other lipidemia: Secondary | ICD-10-CM | POA: Diagnosis not present

## 2020-11-15 DIAGNOSIS — Z85828 Personal history of other malignant neoplasm of skin: Secondary | ICD-10-CM | POA: Diagnosis not present

## 2020-11-15 DIAGNOSIS — K8689 Other specified diseases of pancreas: Secondary | ICD-10-CM | POA: Diagnosis present

## 2020-11-15 DIAGNOSIS — K8020 Calculus of gallbladder without cholecystitis without obstruction: Secondary | ICD-10-CM | POA: Diagnosis present

## 2020-11-15 DIAGNOSIS — Z86711 Personal history of pulmonary embolism: Secondary | ICD-10-CM | POA: Diagnosis not present

## 2020-11-15 DIAGNOSIS — Z79899 Other long term (current) drug therapy: Secondary | ICD-10-CM | POA: Diagnosis not present

## 2020-11-15 DIAGNOSIS — Z79891 Long term (current) use of opiate analgesic: Secondary | ICD-10-CM | POA: Diagnosis not present

## 2020-11-15 DIAGNOSIS — Z86718 Personal history of other venous thrombosis and embolism: Secondary | ICD-10-CM | POA: Diagnosis not present

## 2020-11-15 DIAGNOSIS — K8050 Calculus of bile duct without cholangitis or cholecystitis without obstruction: Secondary | ICD-10-CM

## 2020-11-15 DIAGNOSIS — R112 Nausea with vomiting, unspecified: Secondary | ICD-10-CM

## 2020-11-15 DIAGNOSIS — K267 Chronic duodenal ulcer without hemorrhage or perforation: Secondary | ICD-10-CM | POA: Diagnosis present

## 2020-11-15 DIAGNOSIS — E669 Obesity, unspecified: Secondary | ICD-10-CM

## 2020-11-15 DIAGNOSIS — R52 Pain, unspecified: Secondary | ICD-10-CM | POA: Diagnosis not present

## 2020-11-15 DIAGNOSIS — R1011 Right upper quadrant pain: Secondary | ICD-10-CM | POA: Diagnosis not present

## 2020-11-15 DIAGNOSIS — Z7901 Long term (current) use of anticoagulants: Secondary | ICD-10-CM | POA: Diagnosis not present

## 2020-11-15 DIAGNOSIS — K5900 Constipation, unspecified: Secondary | ICD-10-CM | POA: Diagnosis present

## 2020-11-15 DIAGNOSIS — K219 Gastro-esophageal reflux disease without esophagitis: Secondary | ICD-10-CM | POA: Diagnosis present

## 2020-11-15 DIAGNOSIS — K257 Chronic gastric ulcer without hemorrhage or perforation: Secondary | ICD-10-CM | POA: Diagnosis present

## 2020-11-15 DIAGNOSIS — G8929 Other chronic pain: Secondary | ICD-10-CM | POA: Diagnosis present

## 2020-11-15 DIAGNOSIS — Z8249 Family history of ischemic heart disease and other diseases of the circulatory system: Secondary | ICD-10-CM | POA: Diagnosis not present

## 2020-11-15 DIAGNOSIS — R109 Unspecified abdominal pain: Secondary | ICD-10-CM

## 2020-11-15 DIAGNOSIS — I10 Essential (primary) hypertension: Secondary | ICD-10-CM | POA: Diagnosis present

## 2020-11-15 DIAGNOSIS — K5909 Other constipation: Secondary | ICD-10-CM

## 2020-11-15 DIAGNOSIS — Z20822 Contact with and (suspected) exposure to covid-19: Secondary | ICD-10-CM | POA: Diagnosis present

## 2020-11-15 DIAGNOSIS — F32A Depression, unspecified: Secondary | ICD-10-CM | POA: Diagnosis present

## 2020-11-15 DIAGNOSIS — Z6830 Body mass index (BMI) 30.0-30.9, adult: Secondary | ICD-10-CM | POA: Diagnosis not present

## 2020-11-15 DIAGNOSIS — Z87442 Personal history of urinary calculi: Secondary | ICD-10-CM | POA: Diagnosis not present

## 2020-11-15 DIAGNOSIS — K2980 Duodenitis without bleeding: Secondary | ICD-10-CM | POA: Diagnosis present

## 2020-11-15 DIAGNOSIS — D62 Acute posthemorrhagic anemia: Secondary | ICD-10-CM | POA: Diagnosis present

## 2020-11-15 DIAGNOSIS — E876 Hypokalemia: Secondary | ICD-10-CM | POA: Diagnosis present

## 2020-11-15 LAB — CBC WITH DIFFERENTIAL/PLATELET
Abs Immature Granulocytes: 0.02 10*3/uL (ref 0.00–0.07)
Basophils Absolute: 0 10*3/uL (ref 0.0–0.1)
Basophils Relative: 0 %
Eosinophils Absolute: 0.1 10*3/uL (ref 0.0–0.5)
Eosinophils Relative: 1 %
HCT: 32.1 % — ABNORMAL LOW (ref 36.0–46.0)
Hemoglobin: 10.4 g/dL — ABNORMAL LOW (ref 12.0–15.0)
Immature Granulocytes: 0 %
Lymphocytes Relative: 12 %
Lymphs Abs: 1.1 10*3/uL (ref 0.7–4.0)
MCH: 27.6 pg (ref 26.0–34.0)
MCHC: 32.4 g/dL (ref 30.0–36.0)
MCV: 85.1 fL (ref 80.0–100.0)
Monocytes Absolute: 0.4 10*3/uL (ref 0.1–1.0)
Monocytes Relative: 5 %
Neutro Abs: 7.6 10*3/uL (ref 1.7–7.7)
Neutrophils Relative %: 82 %
Platelets: 250 10*3/uL (ref 150–400)
RBC: 3.77 MIL/uL — ABNORMAL LOW (ref 3.87–5.11)
RDW: 15.9 % — ABNORMAL HIGH (ref 11.5–15.5)
WBC: 9.2 10*3/uL (ref 4.0–10.5)
nRBC: 0 % (ref 0.0–0.2)

## 2020-11-15 LAB — URINALYSIS, COMPLETE (UACMP) WITH MICROSCOPIC
Bacteria, UA: NONE SEEN
Bilirubin Urine: NEGATIVE
Glucose, UA: NEGATIVE mg/dL
Hgb urine dipstick: NEGATIVE
Ketones, ur: NEGATIVE mg/dL
Nitrite: NEGATIVE
Protein, ur: NEGATIVE mg/dL
Specific Gravity, Urine: 1.002 — ABNORMAL LOW (ref 1.005–1.030)
pH: 7 (ref 5.0–8.0)

## 2020-11-15 LAB — URINALYSIS, ROUTINE W REFLEX MICROSCOPIC
Bacteria, UA: NONE SEEN
Bilirubin Urine: NEGATIVE
Glucose, UA: NEGATIVE mg/dL
Hgb urine dipstick: NEGATIVE
Ketones, ur: 20 mg/dL — AB
Nitrite: NEGATIVE
Protein, ur: NEGATIVE mg/dL
Specific Gravity, Urine: 1.013 (ref 1.005–1.030)
pH: 7 (ref 5.0–8.0)

## 2020-11-15 LAB — COMPREHENSIVE METABOLIC PANEL
ALT: 6 U/L (ref 0–44)
AST: 26 U/L (ref 15–41)
Albumin: 4.1 g/dL (ref 3.5–5.0)
Alkaline Phosphatase: 47 U/L (ref 38–126)
Anion gap: 12 (ref 5–15)
BUN: 14 mg/dL (ref 8–23)
CO2: 22 mmol/L (ref 22–32)
Calcium: 8.9 mg/dL (ref 8.9–10.3)
Chloride: 103 mmol/L (ref 98–111)
Creatinine, Ser: 0.69 mg/dL (ref 0.44–1.00)
GFR, Estimated: >60 mL/min (ref >60)
Glucose, Bld: 129 mg/dL — ABNORMAL HIGH (ref 70–99)
Potassium: 3.8 mmol/L (ref 3.5–5.1)
Sodium: 137 mmol/L (ref 135–145)
Total Bilirubin: 0.7 mg/dL (ref 0.3–1.2)
Total Protein: 7.2 g/dL (ref 6.5–8.1)

## 2020-11-15 LAB — SARS CORONAVIRUS 2 (TAT 6-24 HRS): SARS Coronavirus 2: NEGATIVE

## 2020-11-15 LAB — LIPASE, BLOOD: Lipase: 32 U/L (ref 11–51)

## 2020-11-15 MED ORDER — PNEUMOCOCCAL VAC POLYVALENT 25 MCG/0.5ML IJ INJ
0.5000 mL | INJECTION | INTRAMUSCULAR | Status: AC
  Administered 2020-11-16: 0.5 mL via INTRAMUSCULAR
  Filled 2020-11-15: qty 0.5

## 2020-11-15 MED ORDER — HYDROMORPHONE HCL 1 MG/ML IJ SOLN
1.0000 mg | Freq: Once | INTRAMUSCULAR | Status: AC
  Administered 2020-11-15: 1 mg via INTRAVENOUS
  Filled 2020-11-15: qty 1

## 2020-11-15 MED ORDER — PANTOPRAZOLE SODIUM 40 MG IV SOLR
40.0000 mg | Freq: Two times a day (BID) | INTRAVENOUS | Status: DC
  Administered 2020-11-15 – 2020-11-18 (×7): 40 mg via INTRAVENOUS
  Filled 2020-11-15 (×7): qty 40

## 2020-11-15 MED ORDER — ONDANSETRON HCL 4 MG/2ML IJ SOLN
4.0000 mg | Freq: Four times a day (QID) | INTRAMUSCULAR | Status: DC | PRN
  Administered 2020-11-16 – 2020-11-17 (×2): 4 mg via INTRAVENOUS
  Filled 2020-11-15 (×2): qty 2

## 2020-11-15 MED ORDER — ENOXAPARIN SODIUM 40 MG/0.4ML ~~LOC~~ SOLN
40.0000 mg | SUBCUTANEOUS | Status: DC
  Administered 2020-11-15 – 2020-11-18 (×4): 40 mg via SUBCUTANEOUS
  Filled 2020-11-15 (×4): qty 0.4

## 2020-11-15 MED ORDER — SUCRALFATE 1 GM/10ML PO SUSP
1.0000 g | Freq: Three times a day (TID) | ORAL | Status: DC
  Administered 2020-11-15 – 2020-11-18 (×12): 1 g via ORAL
  Filled 2020-11-15 (×12): qty 10

## 2020-11-15 MED ORDER — DOCUSATE SODIUM 100 MG PO CAPS: 100.0000 mg | ORAL_CAPSULE | Freq: Two times a day (BID) | ORAL | Status: DC | PRN

## 2020-11-15 MED ORDER — AMLODIPINE BESYLATE 5 MG PO TABS
2.5000 mg | ORAL_TABLET | Freq: Every day | ORAL | Status: DC
  Administered 2020-11-15 – 2020-11-18 (×4): 2.5 mg via ORAL
  Filled 2020-11-15 (×4): qty 1

## 2020-11-15 MED ORDER — POLYETHYLENE GLYCOL 3350 17 G PO PACK
17.0000 g | PACK | Freq: Every day | ORAL | Status: DC
  Administered 2020-11-15: 17 g via ORAL
  Filled 2020-11-15: qty 1

## 2020-11-15 MED ORDER — PEG 3350-KCL-NA BICARB-NACL 420 G PO SOLR
4000.0000 mL | Freq: Once | ORAL | Status: AC
  Administered 2020-11-15: 4000 mL via ORAL

## 2020-11-15 MED ORDER — HYDROMORPHONE HCL 1 MG/ML IJ SOLN
0.5000 mg | INTRAMUSCULAR | Status: DC | PRN
  Administered 2020-11-15 – 2020-11-17 (×7): 0.5 mg via INTRAVENOUS
  Filled 2020-11-15 (×7): qty 0.5

## 2020-11-15 NOTE — ED Notes (Addendum)
Pt ambulated to bathroom without difficulty. Gait steady. No c/o increased pain, SOB or chest pain.

## 2020-11-15 NOTE — H&P (Addendum)
History and Physical  Maria Murphy OIN:[id number] DOB: 17-Jul-1956 DOA: 11/14/2020  Referring physician: Delora Fuel, MD PCP: Ladell Pier, MD  Patient coming from: Home  Chief Complaint: Right flank pain  HPI: Maria Murphy is a 65 y.o. female with medical history significant for PUD/duodenal ulcers,prior PEs on Xarelto, hypertension,GERD,obesity, and cholelithiasis who presents to the emergency department due to right flank pain which started yesterday (3/24) in the morning.  Pain was in right upper quadrant with radiation to right flank, it was rated as 10/10 on pain scale with no known alleviating/aggravating factor.  Oxycodone-acetaminophen taken at home provided no relief.  Pain was associated with nausea and vomiting, but she denies fever, chills, headaches. Patient was admitted to this facility from 3/11-3/13 due to acute GI bleed while on Xarelto, peptic ulcer disease/pancreatic mass/abdominal pain.  ED Course:  In the emergency department, she was hemodynamically stable, BP was 152/89.  Work-up in the ED showed normal CT anemia and normal BMP except for mild hyperglycemia (CBG-129).  Lipase 32, urinalysis was unimpressive for UTI. CT abdomen and pelvis without contrast showed cholelithiasis, Stable partially calcified cystic mass within the pancreatic head.Stable bilateral renal cystic lesions. No renal or ureteral stones. No hydronephrosis. Large stool burden throughout the colon. Fibroid uterus.  IV hydration was provided and IV Dilaudid was given.  Zofran was given as well.  Hospitalist was asked to admit patient for further evaluation and management.  Review of Systems: Constitutional: Negative for chills and fever.  HENT: Negative for ear pain and sore throat.   Eyes: Negative for pain and visual disturbance.  Respiratory: Negative for cough, chest tightness and shortness of breath.   Cardiovascular: Negative for chest pain and palpitations.  Gastrointestinal: RUQ and  right flank pain.  Negative for abdominal pain and vomiting.  Endocrine: Negative for polyphagia and polyuria.  Genitourinary: Negative for decreased urine volume, dysuria, enuresis, hematuria Musculoskeletal: Negative for arthralgias and back pain.  Skin: Negative for color change and rash.  Allergic/Immunologic: Negative for immunocompromised state.  Neurological: Negative for tremors, syncope, speech difficulty, weakness, light-headedness and headaches.  Hematological: Does not bruise/bleed easily.  All other systems reviewed and are negative   Past Medical History:  Diagnosis Date   Collagen vascular disease ([place])    Essential hypertension, benign 01/24/2015   Foot ulcer ([place])    GERD (gastroesophageal reflux disease)    Hypertension    Memory loss    PUD (peptic ulcer disease)    with perforation s/p exploratory laparotomy   Pulmonary embolus ([place]) 04/2020   Skin cancer    Vision abnormalities    Past Surgical History:  Procedure Laterality Date   CENTRAL LINE INSERTION Right 04/22/2020   Procedure: CENTRAL LINE INSERTION;  Surgeon: Virl Cagey, MD;  Location: [facility];  Service: General;  Laterality: Right;   DIAGNOSTIC LAPAROSCOPY     ESOPHAGOGASTRODUODENOSCOPY (EGD) WITH PROPOFOL N/A 07/10/2020   Procedure: ESOPHAGOGASTRODUODENOSCOPY (EGD) WITH PROPOFOL;  Surgeon: Rogene Houston, MD;  Location: [facility];  Service: Endoscopy;  Laterality: N/A;   ESOPHAGOGASTRODUODENOSCOPY (EGD) WITH PROPOFOL N/A 10/18/2020   Procedure: ESOPHAGOGASTRODUODENOSCOPY (EGD) WITH PROPOFOL;  Surgeon: Harvel Quale, MD;  Location: [facility];  Service: Gastroenterology;  Laterality: N/A;   GASTRORRHAPHY  04/22/2020   Procedure: GASTRORRHAPHY;  Surgeon: Virl Cagey, MD;  Location: [facility];  Service: General;;   IR CATHETER TUBE CHANGE  05/09/2020   LAPAROTOMY N/A 04/22/2020   Procedure: EXPLORATORY LAPAROTOMY;  Surgeon: Virl Cagey, MD;  Location:  [facility];  Service: General;  Laterality: N/A;   TONSILLECTOMY      Social History:  reports that she has never smoked. She has never used smokeless tobacco. She reports current alcohol use of about 2.0 - 3.0 standard drinks of alcohol per week. She reports that she does not use drugs.   Allergies  Allergen Reactions   Wheat Extract Swelling    Family History  Problem Relation Age of Onset   High Cholesterol Mother    Hypertension Mother    Arthritis/Rheumatoid Mother    Alcohol abuse Father    Hypertension Maternal Grandmother    Colon cancer Neg Hx    Colon polyps Neg Hx    Inflammatory bowel disease Neg Hx     Prior to Admission medications   Medication Sig Start Date End Date Taking? Authorizing Provider  acetaminophen (TYLENOL) 500 MG tablet Take 1 tablet (500 mg total) by mouth every 6 (six) hours as needed for mild pain or fever. Patient taking differently: Take 1,000 mg by mouth as needed for mild pain or fever. 09/27/20   Johnson, Clanford L, MD  amLODipine (NORVASC) 2.5 MG tablet Take 1 tablet (2.5 mg total) by mouth daily. For BP 11/03/20   Emokpae, Courage, MD  citalopram (CELEXA) 20 MG tablet Take 20 mg by mouth daily. 11/04/20   [provider]  dicyclomine (BENTYL) 10 MG capsule Take 10 mg by mouth 2 (two) times daily. 11/04/20   [provider]  donepezil (ARICEPT) 10 MG tablet Take 0.5-1 tablets (5-10 mg total) by mouth in the morning and at bedtime. Patient taking differently: Take 10 mg by mouth in the morning and at bedtime. 09/20/20   Ladell Pier, MD  famotidine (PEPCID) 20 MG tablet Take 1 tablet (20 mg total) by mouth 2 (two) times daily. 11/11/20   Sherwood Gambler, MD  ferrous sulfate 325 (65 FE) MG tablet Take 1 tablet (325 mg total) by mouth daily with breakfast. 11/03/20   Roxan Hockey, MD  gabapentin (NEURONTIN) 300 MG capsule Take 1 capsule (300 mg total) by mouth 2 (two) times daily. 10/08/20   Ladell Pier, MD   lactose free nutrition (BOOST) LIQD Take 237 mLs by mouth 2 (two) times daily.    [provider]  lipase/protease/amylase (CREON) 36000 UNITS CPEP capsule Take 2 capsules (72,000 Units total) by mouth 3 (three) times daily with meals. May also take 1 capsule (36,000 Units total) as needed (with snacks). 10/16/20   Erenest Rasher, PA-C  mirtazapine (REMERON) 15 MG tablet Take 1 tablet (15 mg total) by mouth at bedtime. 10/08/20   Ladell Pier, MD  ondansetron (ZOFRAN ODT) 8 MG disintegrating tablet Take 1 tablet (8 mg total) by mouth every 8 (eight) hours as needed for nausea or vomiting. 10/16/20   Erenest Rasher, PA-C  pantoprazole (PROTONIX) 40 MG tablet Take 1 tablet (40 mg total) by mouth 2 (two) times daily. 11/03/20 12/03/20  Roxan Hockey, MD  promethazine (PHENERGAN) 25 MG suppository Place 1 suppository (25 mg total) rectally every 6 (six) hours as needed for nausea or vomiting. 11/11/20   Sherwood Gambler, MD  promethazine (PHENERGAN) 25 MG tablet Take 1 tablet (25 mg total) by mouth every 6 (six) hours as needed for nausea or vomiting. 11/11/20   Sherwood Gambler, MD  rivaroxaban (XARELTO) 10 MG TABS tablet Take 1 tablet (10 mg total) by mouth every evening. 11/08/20   Roxan Hockey, MD  sucralfate (CARAFATE) 1 GM/10ML  suspension Take 10 mLs (1 g total) by mouth 4 (four) times daily -  with meals and at bedtime. 11/03/20   Roxan Hockey, MD  traZODone (DESYREL) 100 MG tablet Take 100 mg by mouth at bedtime. 11/04/20   [provider]    Physical Exam: BP (!) 160/95 (BP Location: Right Arm)    Pulse 84    Temp 98.6 F (37 C) (Oral)    Resp 18    Ht 5\' 4"  (1.626 m)    Wt 79.4 kg    SpO2 100%    BMI 30.04 kg/m    General: 65 y.o. year-old female well developed well nourished in no acute distress.  Alert and oriented x3.  HEENT: NCAT, EOMI  Neck: Supple, trachea medial  Cardiovascular: Regular rate and rhythm with no rubs or gallops.  No thyromegaly or JVD  noted.  No lower extremity edema. 2/4 pulses in all 4 extremities.  Respiratory: Clear to auscultation with no wheezes or rales. Good inspiratory effort.  Abdomen: Soft, tender to palpation of RUQ and right flank area. Normal bowel sounds x4 quadrants.  Muskuloskeletal: No cyanosis, clubbing or edema noted bilaterally  Neuro: CN II-XII intact, sensation, reflexes intact  Skin: No ulcerative lesions noted or rashes  Psychiatry: Judgement and insight appear normal. Mood is appropriate for condition and setting          Labs on Admission:  Basic Metabolic Panel: Recent Labs  Lab 11/10/20 0430 11/11/20 0648 11/14/20 2329  NA 140 138 137  K 3.8 3.7 3.8  CL 109 106 103  CO2 23 24 22   GLUCOSE 122* 139* 129*  BUN 12 7* 14  CREATININE 0.66 0.64 0.69  CALCIUM 9.0 8.8* 8.9   Liver Function Tests: Recent Labs  Lab 11/10/20 0430 11/11/20 0648 11/14/20 2329  AST 19 17 26   ALT 7 8 6   ALKPHOS 48 46 47  BILITOT 0.4 0.5 0.7  PROT 6.9 6.5 7.2  ALBUMIN 4.0 3.7 4.1   Recent Labs  Lab 11/10/20 0430 11/11/20 0648 11/14/20 2329  LIPASE 29 39 32   No results for input(s): AMMONIA in the last 168 hours. CBC: Recent Labs  Lab 11/10/20 0430 11/11/20 0648 11/14/20 2329  WBC 6.4 8.1 9.2  NEUTROABS 4.3 6.7 7.6  HGB 10.2* 10.1* 10.4*  HCT 31.6* 31.0* 32.1*  MCV 87.1 86.6 85.1  PLT 216 209 250   Cardiac Enzymes: No results for input(s): CKTOTAL, CKMB, CKMBINDEX, TROPONINI in the last 168 hours.  BNP (last 3 results) No results for input(s): BNP in the last 8760 hours.  ProBNP (last 3 results) No results for input(s): PROBNP in the last 8760 hours.  CBG: No results for input(s): GLUCAP in the last 168 hours.  Radiological Exams on Admission: CT Renal Stone Study  Result Date: 11/14/2020 CLINICAL DATA:  Right flank pain EXAM: CT ABDOMEN AND PELVIS WITHOUT CONTRAST TECHNIQUE: Multidetector CT imaging of the abdomen and pelvis was performed following the standard protocol  without IV contrast. COMPARISON:  11/10/2020 FINDINGS: Lower chest: Scarring in the lingula. Small hiatal hernia. No effusions. Hepatobiliary: Multiple gallstones within the gallbladder measuring up 2 17 mm. Small hypodensity in the right hepatic dome compatible with cyst. Pancreas: Partially calcified cystic mass again seen within the pancreatic head measures 4.2 cm, stable. No pancreatic ductal dilatation. Spleen: No focal abnormality.  Normal size. Adrenals/Urinary Tract: Multiple cystic lesions within the kidneys bilaterally. No hydronephrosis. Adrenal glands and urinary bladder unremarkable. Stomach/Bowel: Large stool burden throughout the colon.  Normal appendix. Stomach is decompressed. Mildly prominent fluid-filled right abdominal small bowel loops. Terminal ileum is decompressed. Vascular/Lymphatic: No evidence of aneurysm or adenopathy. Reproductive: Fibroid uterus.  No adnexal mass. Other: No free fluid or free air. Musculoskeletal: No acute bony abnormality. IMPRESSION: Cholelithiasis. Stable partially calcified cystic mass within the pancreatic head. Stable bilateral renal cystic lesions. No renal or ureteral stones. No hydronephrosis. Large stool burden throughout the colon. Fibroid uterus. Electronically Signed   By: Rolm Baptise M.D.   On: 11/14/2020 23:55    EKG: I independently viewed the EKG done and my findings are as followed: EKG was not done in the ED  Assessment/Plan Present on Admission:  Right flank pain  Abdominal pain  Essential hypertension  Cholelithiasis  Pancreatic mass  Obesity (BMI 30-39.9)  History of pulmonary embolus (PE)  Active Problems:   Essential hypertension   Abdominal pain   Cholelithiasis   Pancreatic mass   History of pulmonary embolus (PE)   Obesity (BMI 30-39.9)   Right flank pain   Nausea & vomiting   History of DVT (deep vein thrombosis)   Nausea, vomiting, abdominal pain and right flank pain secondary to multifactorial including  cholelithiasis, history of pancreatic mass History of GI bleed, peptic ulcer disease Patient had inflamed gallbladder appearance  09/18/2020 HIDA scan normal  10/18/20 RUQ US--Cholelithiasis and mild gallbladder wall thickening  CT abdomen and pelvis without contrast showed cholelithiasis, Stable partially calcified cystic mass within the pancreatic head EGD done on 10/18/2020 showed  Fw erosions in the gastric body, also had a superficial gastric ulcer with clean base in the pylorus and also had a large duodenal ulcer up to 35 mm in the duodenal bulb with presence of one suture crossing the ulcer Patient has an upcoming EUS on 11/20/2020 by Dr. Rush Landmark per medical record  Continue IV Dilaudid 0.5 mg every 3 hours as needed Continue IV Zofran 4 mg every 6 hours as needed Continue IV Protonix 40 mg twice daily Continue Carafate Continue clear liquid diet RUQ ultrasound will be repeated Consider gastroenterology/general surgery consult based on sonographic finding  Constipation The abdomen and pelvis showed large stool burden throughout the colon This may be due to patient's chronic opioid use Continue MiraLAX and docusate  Normocytic anemia H/H 10.4/32.1, hemoglobin is within baseline range Continue to monitor H/H with morning labs  History of DVT/PE Continue Xarelto 10 mg daily per medical record  Essential hypertension Continue amlodipine  DVT prophylaxis: Xarelto  Code Status: Full code  Family Communication: None at bedside  Disposition Plan:  Patient is from:                        home Anticipated DC to:                   SNF or family members home Anticipated DC date:               1 Day Anticipated DC barriers:           Patient requires inpatient management due to abdominal pain with pending work-up   Consults called: None  Admission status: Observation    Bernadette Hoit MD [facility]  11/15/2020, 7:03 AM

## 2020-11-15 NOTE — Progress Notes (Signed)
PROGRESS NOTE  Maria Murphy EHM:[id number] DOB: Sep 20, 1955 DOA: 11/14/2020 PCP: Ladell Pier, MD  Brief History:  65 year old female with history of hypertension, peptic ulcer disease with perforation, saddle pulmonary embolus on rivaroxaban until recently on 11/03/20, depression presenting with 1-day history of intermittent abdominal pain and nausea and vomiting. She describes her abd pain as severe and sharp, primarily in RUQ and R-flank.  She had one episode of nausea and vomiting on the day of admission.  She denies any NSAID use.  She states that she took 3 Percocets before coming to the emergency department on 11/14/2020 without relief.  She has not had any fevers, chills, dysuria, hematuria, hematochezia, melena, diarrhea.  Her last bowel movement was on the morning of 11/14/2020.  She denies any frank chest pain, shortness breath, hemoptysis, coughing, headache, neck pain, rashes.  She denies any recent injury or trauma.  Patient has had numerous emergency department visits and hospital admissions.  Most recently, the patient was admitted to the hospital from 11/01/2020 to 11/03/2020 for another GI bleed.  The patient was transfused 1 unit PRBC.  She was discharged with a hemoglobin 9.3.  During that hospital admission, the decision was made to discontinue her therapeutic dosing of rivaroxaban.  Although the patient was initially diagnosed with her PE on 05/01/2020, the patient had endorsed poor compliance with her rivaroxaban until the beginning of November 2021 after which she had been taking it faithfully.  Therefore, the target date for stopping her rivaroxaban was initially targeted around the end of April 2022.  However given the patient's recurrent GI bleeds and the fact that she had finished essentially 5 continuous months of rivaroxaban, the decision was made to discontinue therapeutic dosing and start the patient on rivaroxaban 10 mg daily at the time of her discharge on  11/03/2020. Unfortunately, as consistent with her previous history the patient had some type of misunderstanding and has not restarted her rivaroxaban in the interim since her discharge on 11/03/2020. Once again, the patient had a recent ED visit on 11/10/2020 with similar right flank pain.  CT of the abdomen and pelvis at that time was unremarkable, and the patient was discharged home in stable condition. The patient has had 2 recent emergency department visits. 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 There was also more prominent main pancreatic duct.There is also an inflamed appearance of the distal stomach and proximal duodenum with adjacent fat stranding that was increased since 09/24/2020. There is mild inflamed appearance of the gallbladder and porta hepatis. The patient was discharged home in stable condition after fluids and conservative care in the emergency department with a prescription for oxycodone.   Notably, the patient has had a complicated history of peptic ulcer disease secondary to NSAID use. She had a prolonged hospitalization from 04/21/2020 to 05/17/2020 secondary to a perforated gastric ulcer. She underwent exploratory laparotomy with Phillip Heal patch placed on 04/22/2021. This was complicated by intra-abdominal abscess for which required IR aspiration. That hospitalization was also complicated by a pulmonary embolus for which the patient was started on rivaroxaban. She was subsequently remitted to the hospital on 07/05/2020 to 07/12/2020 for intractable nausea and vomiting. Fortunately, the patient improved with conservative/nonoperative management. She underwent EGD on 07/10/2020 which showed a scar in the gastric antrum and prepyloric region. There was nonbleeding duodenal ulcers. Once  again, the patient was readmitted from 09/23/2020 to2/4/22 for abdominal pain and  intractable nausea and vomiting. Again, the patient improved with conservative care.  Assessment/Plan: Intractable Right abdominal pain/vomiting -This appears to be an exacerbation of her chronic upper and R-sided abd pain -Although she does not specifically endorse "chronic" Right side abd pain, review of her extensive medical records shows she has had this type and location of pain on several prior occasions -Suspect this is multifactorial secondary to her peptic ulcer disease, constipation and possible symptomatic cholelithiasis -Continue Protonix and carafate -10/18/20 EGD--nonbleeding gastric ulcers;large duodenal ulcer in the duodenal bulb, close to 35 mm in diameter;no presence of stigmata of recent bleeding but there was presence of a suture at the bed of the ulcer which was semicircumferential -Avoid NSAIDs -11/14/20 CT abd-multiple gallstones; stable pancreatic partially calcified cystic mass 4.2 cm; no ureteral stones or hydronephrosis; large stool burden -10/15/2020 CT abdomen--as discussed above -hold Xarelto temporarily  Inflamed gallbladder appearance--noted on prior imaging -09/18/2020 HIDA scan normal -10/18/20 RUQ US--Cholelithiasis and mild gallbladder wall thickening.+murphy's -Previously seen by surgery who did not feel that her clinical presentation was consistent with cholecystitis -follow up on RUQ [place] 11/15/20  Pancreatic head mass -This has been followed by surveillance by GI -EUS upcoming 11/20/20 by Dr. Bruce Donath -baseline Hgb 10-11 -Hgb now stable  History of pulmonary embolus -Patient had submassive/saddle embolus on CTA on 05/01/2020 -Temporarily holding rivaroxaban -patient has been taking faithfully since beginning of Nov 2021 -plan 6 full months of tx-->prior target end of April but stopped on 11/03/20 as discussed above  Essential hypertension -Holding lisinopril HCTZ for now -Monitor BP -IV labetalol as neededSBP  >180  Depression -Restart Celexa and trazodone once able to tolerate p.o. -Restart Remeron once able to tolerate p.o.    Status is: Inpatient  Remains inpatient appropriate because:IV treatments appropriate due to intensity of illness or inability to take PO   Dispo: The patient is from:Home Anticipated d/c is HM:CNOB Patient currently is not medically stable to d/c. Difficult to place patient No        Family Communication:sister updatedat bedside 2/27 Consultants:GI  Code Status: FULL  DVT Prophylaxis:SCDs   Procedures: As Listed in Progress Note Above  Antibiotics: None          Total time spent 35 minutes.  Greater than 50% spent face to face counseling and coordinating care.    Subjective: Patient complains of RUQ pain.  Denies f/c,cp, sob, n/v/d, hematochezia, melena  Objective: Vitals:   11/15/20 0600 11/15/20 0700 11/15/20 0730 11/15/20 0800  BP: (!) 160/95 (!) 163/92 (!) 165/86 (!) 142/85  Pulse: 84 79 64 70  Resp: 18 18  18   Temp:      TempSrc:      SpO2: 100% 92% 94% 98%  Weight:      Height:        Intake/Output Summary (Last 24 hours) at 11/15/2020 0834 Last data filed at 11/15/2020 0100 Gross per 24 hour  Intake 1000 ml  Output --  Net 1000 ml   Weight change:  Exam:   General:  Pt is alert, follows commands appropriately, not in acute distress  HEENT: No icterus, No thrush, No neck mass, /AT  Cardiovascular: RRR, S1/S2, no rubs, no gallops  Respiratory: CTA bilaterally, no wheezing, no crackles, no rhonchi  Abdomen: Soft/+BS, epigastric and RUQ tender, non distended, no guarding  Extremities: No edema, No lymphangitis, No petechiae, No rashes, no synovitis   Data Reviewed: I  have personally reviewed following labs and imaging studies Basic Metabolic Panel: Recent Labs  Lab 11/10/20 0430 11/11/20 0648 11/14/20 2329  NA 140 138 137  K 3.8  3.7 3.8  CL 109 106 103  CO2 23 24 22   GLUCOSE 122* 139* 129*  BUN 12 7* 14  CREATININE 0.66 0.64 0.69  CALCIUM 9.0 8.8* 8.9   Liver Function Tests: Recent Labs  Lab 11/10/20 0430 11/11/20 0648 11/14/20 2329  AST 19 17 26   ALT 7 8 6   ALKPHOS 48 46 47  BILITOT 0.4 0.5 0.7  PROT 6.9 6.5 7.2  ALBUMIN 4.0 3.7 4.1   Recent Labs  Lab 11/10/20 0430 11/11/20 0648 11/14/20 2329  LIPASE 29 39 32   No results for input(s): AMMONIA in the last 168 hours. Coagulation Profile: No results for input(s): INR, PROTIME in the last 168 hours. CBC: Recent Labs  Lab 11/10/20 0430 11/11/20 0648 11/14/20 2329  WBC 6.4 8.1 9.2  NEUTROABS 4.3 6.7 7.6  HGB 10.2* 10.1* 10.4*  HCT 31.6* 31.0* 32.1*  MCV 87.1 86.6 85.1  PLT 216 209 250   Cardiac Enzymes: No results for input(s): CKTOTAL, CKMB, CKMBINDEX, TROPONINI in the last 168 hours. BNP: Invalid input(s): POCBNP CBG: No results for input(s): GLUCAP in the last 168 hours. HbA1C: No results for input(s): HGBA1C in the last 72 hours. Urine analysis:    Component Value Date/Time   COLORURINE YELLOW 11/15/2020 0102   APPEARANCEUR HAZY (A) 11/15/2020 0102   LABSPEC 1.013 11/15/2020 0102   PHURINE 7.0 11/15/2020 0102   GLUCOSEU NEGATIVE 11/15/2020 0102   HGBUR NEGATIVE 11/15/2020 0102   BILIRUBINUR NEGATIVE 11/15/2020 0102   KETONESUR 20 (A) 11/15/2020 0102   PROTEINUR NEGATIVE 11/15/2020 0102   NITRITE NEGATIVE 11/15/2020 0102   LEUKOCYTESUR MODERATE (A) 11/15/2020 0102   Sepsis Labs: @LABRCNTIP (procalcitonin:4,lacticidven:4) )No results found for this or any previous visit (from the past 240 hour(s)).   Scheduled Meds: . pantoprazole (PROTONIX) IV  40 mg Intravenous Q12H  . polyethylene glycol  17 g Oral Daily   Continuous Infusions:  Procedures/Studies: CT Angio Chest PE W and/or Wo Contrast  Result Date: 11/10/2020 CLINICAL DATA:  65 year old female with right rib and flank pain. History of PE in September 2021.  Abdominal pain with black tarry stools. History of cholelithiasis. History of exploratory laparotomy, perforated gastric ulcer in August 2021. EXAM: CT ANGIOGRAPHY CHEST CT ABDOMEN AND PELVIS WITH CONTRAST TECHNIQUE: Multidetector CT imaging of the chest was performed using the standard protocol during bolus administration of intravenous contrast. Multiplanar CT image reconstructions and MIPs were obtained to evaluate the vascular anatomy. Multidetector CT imaging of the abdomen and pelvis was performed using the standard protocol during bolus administration of intravenous contrast. CONTRAST:  14mL OMNIPAQUE IOHEXOL 350 MG/ML SOLN COMPARISON:  CT Abdomen and Pelvis 10/15/2020 and earlier. CTA chest 05/01/2020. FINDINGS: CTA CHEST FINDINGS Cardiovascular: Good contrast bolus timing in the pulmonary arterial tree. No focal filling defect identified in the pulmonary arteries today to suggest acute or residual pulmonary embolism. Minimal calcified coronary artery atherosclerosis is evident. Mild cardiomegaly. No pericardial effusion. Minimal plaque of the thoracic aorta. Mediastinum/Nodes: Negative.  No lymphadenopathy. Lungs/Pleura: Somewhat lower lung volumes compared to last year. Resolved pleural effusions and lower lobe collapse or consolidation. Mild mosaic attenuation and mild peribronchial streaky opacity in the lingula, favor a combination of atelectasis and mild gas trapping. Major airways are patent. No areas suspicious for acute respiratory infection. Musculoskeletal: Chronic severe T10-T11 disc and endplate degeneration. Chronic  severe degeneration at the visible shoulders. No acute osseous abnormality identified. Review of the MIP images confirms the above findings. CT ABDOMEN and PELVIS FINDINGS Hepatobiliary: Chronic gallstones individually up to 16 mm. Continued indistinct appearance of the porta hepatis which appears mildly inflamed. But at the gallbladder fundus no wall thickening or pericholecystic  inflammation is evident. No hyperenhancement at the gallbladder fossa. Stable liver parenchyma including tiny benign appearing low-density area at the dome. No bile duct enlargement. Pancreas: Abnormal partially calcified multi cystic appearing pancreatic head mass as described on the CT last month appears stable measuring 4.3 cm on series 4, image 37. No main pancreatic ductal dilatation. No superimposed acute pancreatitis suspected. However, along with the indistinct porta hepatis the gastric antrum and proximal duodenum continue to appear indistinct and inflamed. See coronal image 35 today. Compared to last month, the distal stomach and proximal duodenum appear mildly improved. No extraluminal gas or fluid identified. Proximal stomach and distal duodenum appear within normal limits. Spleen: Negative. Adrenals/Urinary Tract: Adrenal glands remain normal. Stable kidneys with bilateral parapelvic and exophytic renal cystic lesions which appeared benign by MRI in August of last year. Symmetric renal contrast excretion with no hydronephrosis or hydroureter. Diminutive and unremarkable urinary bladder. Stable pelvic phleboliths. Stomach/Bowel: Sigmoid diverticulosis again noted. No active inflammation identified. Otherwise redundant large bowel. Retained stool in the right colon and at the hepatic flexure. Normal appendix visible on series 4, image 60. Decompressed and negative terminal ileum. No dilated small bowel. Stomach and duodenum described above. No free air. No free fluid. Vascular/Lymphatic: Major arterial structures remain patent with tortuosity but minimal atherosclerosis. Portal venous system is patent. No lymphadenopathy. Reproductive: Stable lobulated fibroid uterus. Other: No pelvic free fluid. Musculoskeletal: Stable advanced lumbar disc and facet degeneration. No acute osseous abnormality identified. Review of the MIP images confirms the above findings. IMPRESSION: CHEST: 1. Negative for acute or  residual pulmonary embolus. 2. Mild scattered pulmonary atelectasis and gas trapping. No other acute process in the chest. ABDOMEN AND PELVIS: 1. Largely stable CT appearance of the abdomen and pelvis since 10/15/2020 (please see also that report): 2. Stable cystic and partially calcified pancreatic head mass, 4.3 cm. 3. Slightly improved appearance of the gastric antrum, duodenal bulb, and porta hepatis from last month. As before this may reflect improving peptic ulcer disease. Underlying chronic cholelithiasis, without strong evidence of acute cholecystitis today. 4. Fibroid uterus. Multiple renal cysts which had a benign MRI appearance last yea. Sigmoid diverticulosis. Electronically Signed   By: Genevie Ann M.D.   On: 11/10/2020 06:10   CT ABDOMEN PELVIS W CONTRAST  Result Date: 11/10/2020 CLINICAL DATA:  65 year old female with right rib and flank pain. History of PE in September 2021. Abdominal pain with black tarry stools. History of cholelithiasis. History of exploratory laparotomy, perforated gastric ulcer in August 2021. EXAM: CT ANGIOGRAPHY CHEST CT ABDOMEN AND PELVIS WITH CONTRAST TECHNIQUE: Multidetector CT imaging of the chest was performed using the standard protocol during bolus administration of intravenous contrast. Multiplanar CT image reconstructions and MIPs were obtained to evaluate the vascular anatomy. Multidetector CT imaging of the abdomen and pelvis was performed using the standard protocol during bolus administration of intravenous contrast. CONTRAST:  186mL OMNIPAQUE IOHEXOL 350 MG/ML SOLN COMPARISON:  CT Abdomen and Pelvis 10/15/2020 and earlier. CTA chest 05/01/2020. FINDINGS: CTA CHEST FINDINGS Cardiovascular: Good contrast bolus timing in the pulmonary arterial tree. No focal filling defect identified in the pulmonary arteries today to suggest acute or residual pulmonary embolism. Minimal calcified  coronary artery atherosclerosis is evident. Mild cardiomegaly. No pericardial  effusion. Minimal plaque of the thoracic aorta. Mediastinum/Nodes: Negative.  No lymphadenopathy. Lungs/Pleura: Somewhat lower lung volumes compared to last year. Resolved pleural effusions and lower lobe collapse or consolidation. Mild mosaic attenuation and mild peribronchial streaky opacity in the lingula, favor a combination of atelectasis and mild gas trapping. Major airways are patent. No areas suspicious for acute respiratory infection. Musculoskeletal: Chronic severe T10-T11 disc and endplate degeneration. Chronic severe degeneration at the visible shoulders. No acute osseous abnormality identified. Review of the MIP images confirms the above findings. CT ABDOMEN and PELVIS FINDINGS Hepatobiliary: Chronic gallstones individually up to 16 mm. Continued indistinct appearance of the porta hepatis which appears mildly inflamed. But at the gallbladder fundus no wall thickening or pericholecystic inflammation is evident. No hyperenhancement at the gallbladder fossa. Stable liver parenchyma including tiny benign appearing low-density area at the dome. No bile duct enlargement. Pancreas: Abnormal partially calcified multi cystic appearing pancreatic head mass as described on the CT last month appears stable measuring 4.3 cm on series 4, image 37. No main pancreatic ductal dilatation. No superimposed acute pancreatitis suspected. However, along with the indistinct porta hepatis the gastric antrum and proximal duodenum continue to appear indistinct and inflamed. See coronal image 35 today. Compared to last month, the distal stomach and proximal duodenum appear mildly improved. No extraluminal gas or fluid identified. Proximal stomach and distal duodenum appear within normal limits. Spleen: Negative. Adrenals/Urinary Tract: Adrenal glands remain normal. Stable kidneys with bilateral parapelvic and exophytic renal cystic lesions which appeared benign by MRI in August of last year. Symmetric renal contrast excretion with  no hydronephrosis or hydroureter. Diminutive and unremarkable urinary bladder. Stable pelvic phleboliths. Stomach/Bowel: Sigmoid diverticulosis again noted. No active inflammation identified. Otherwise redundant large bowel. Retained stool in the right colon and at the hepatic flexure. Normal appendix visible on series 4, image 60. Decompressed and negative terminal ileum. No dilated small bowel. Stomach and duodenum described above. No free air. No free fluid. Vascular/Lymphatic: Major arterial structures remain patent with tortuosity but minimal atherosclerosis. Portal venous system is patent. No lymphadenopathy. Reproductive: Stable lobulated fibroid uterus. Other: No pelvic free fluid. Musculoskeletal: Stable advanced lumbar disc and facet degeneration. No acute osseous abnormality identified. Review of the MIP images confirms the above findings. IMPRESSION: CHEST: 1. Negative for acute or residual pulmonary embolus. 2. Mild scattered pulmonary atelectasis and gas trapping. No other acute process in the chest. ABDOMEN AND PELVIS: 1. Largely stable CT appearance of the abdomen and pelvis since 10/15/2020 (please see also that report): 2. Stable cystic and partially calcified pancreatic head mass, 4.3 cm. 3. Slightly improved appearance of the gastric antrum, duodenal bulb, and porta hepatis from last month. As before this may reflect improving peptic ulcer disease. Underlying chronic cholelithiasis, without strong evidence of acute cholecystitis today. 4. Fibroid uterus. Multiple renal cysts which had a benign MRI appearance last yea. Sigmoid diverticulosis. Electronically Signed   By: Genevie Ann M.D.   On: 11/10/2020 06:10   US Abdomen Limited  Result Date: 10/18/2020 CLINICAL DATA:  Abdominal pain.  History of gallbladder stones. EXAM: ULTRASOUND ABDOMEN LIMITED RIGHT UPPER QUADRANT COMPARISON:  CT of the abdomen and pelvis on 10/15/2020 and ultrasound of the abdomen 09/24/2020 FINDINGS: Gallbladder:  Gallbladder wall is mildly thickened, 3.6 millimeters. There are numerous stones within the gallbladder, measuring up to 1.5 centimeters. No pericholecystic fluid. Note is made of a positive sonographic Murphy's sign. Common bile duct: Diameter: 5.8 millimeters Liver:  Focal areas of increased attenuation particularly within the gallbladder fossa, consistent with focal fatty infiltration. No focal liver lesions are identified. Portal vein is patent on color Doppler imaging with normal direction of blood flow towards the liver. Other: None. IMPRESSION: 1. Cholelithiasis and mild gallbladder wall thickening. 2. Sonographic Murphy's sign is present today, new since the previous ultrasound exam. Consider acute cholecystitis. 3. There is no pericholecystic fluid. 4. Focal fatty infiltration of the liver. These results will be called to the ordering clinician or representative by the Radiologist Assistant, and communication documented in the PACS or [employer]. Electronically Signed   By: Nolon Nations M.D.   On: 10/18/2020 10:22   DG ABD ACUTE 2+V W 1V CHEST  Result Date: 11/11/2020 CLINICAL DATA:  65 year old female with persistent abdominal pain and vomiting. EXAM: DG ABDOMEN ACUTE WITH 1 VIEW CHEST COMPARISON:  CTA chest and CT Abdomen and Pelvis 11/10/2020, and earlier. FINDINGS: Portable AP upright view of the chest at 0724 hours. Lower lung volumes. No acute pulmonary opacity. Stable cardiac size and mediastinal contours. No pneumoperitoneum. Portable upright and supine views of the abdomen 0726 hours. Non obstructed bowel gas pattern. Cholelithiasis again noted. Pelvic vascular calcifications. No acute osseous abnormality identified. IMPRESSION: 1. Normal bowel gas pattern, no free air. Cholelithiasis again noted. 2. Lower lung volumes with no acute cardiopulmonary abnormality. Electronically Signed   By: Genevie Ann M.D.   On: 11/11/2020 07:54   DG Abdomen Acute W/Chest  Result Date:  11/01/2020 CLINICAL DATA:  Abdominal pain with black, tarry stools. EXAM: DG ABDOMEN ACUTE WITH 1 VIEW CHEST COMPARISON:  October 18, 2020 FINDINGS: There is no evidence of dilated bowel loops or free intraperitoneal air. No radiopaque calculi are seen. A stable 1.1 cm soft tissue calcification is seen overlying the medial aspect of the mid to upper right abdomen. Heart size and mediastinal contours are within normal limits. Chronic appearing increased lung markings are seen without evidence of acute infiltrate. IMPRESSION: 1. No evidence of bowel obstruction or acute cardiopulmonary disease. 2. Stable soft tissue calcification within the abdomen which corresponds to the partially calcified pancreatic mass seen on prior studies. Electronically Signed   By: Virgina Norfolk M.D.   On: 11/01/2020 23:55   DG Abdomen Acute W/Chest  Result Date: 10/18/2020 CLINICAL DATA:  Abdominal pain post surgery for perirectal abscess EXAM: DG ABDOMEN ACUTE WITH 1 VIEW CHEST COMPARISON:  Radiograph 10/16/2020, CT 10/15/2020 FINDINGS: Streaky basilar opacities and low volumes favoring atelectasis. No consolidation, features of edema, pneumothorax, or effusion. Pulmonary vascularity is normally distributed. The cardiomediastinal contours are unremarkable. No subdiaphragmatic free air. Redemonstrated calcification at the level of the pancreatic head corresponding to a lesion on comparison CT. There is increasing air distention of the stomach and mid abdominal small bowel with a moderate to large colonic stool burden. Few calcified gallstones are less well visualized on this exam. No acute osseous abnormality. Degenerative changes in the shoulders, spine, hips and pelvis. The osseous structures appear diffusely demineralized which may limit detection of small or nondisplaced fractures. No worrisome findings of the remaining soft tissues. IMPRESSION: 1. Moderate to large colonic stool burden with increasing air distention of the  stomach and mid abdominal small bowel possibly related to constipation though could reflect an early or developing ileus in the appropriate clinical setting. No free air. 2. Calcification of the level of the pancreatic head corresponding to a pancreatic mass on comparison CT. 3. Lung volumes are low with streaky basilar opacities favoring atelectasis.  Electronically Signed   By: Lovena Le M.D.   On: 10/18/2020 04:09   MM DIAG BREAST TOMO BILATERAL  Result Date: 11/12/2020 CLINICAL DATA:  65 year old female presenting for annual bilateral mammogram and 1 year follow-up of probably benign right breast calcifications. EXAM: DIGITAL DIAGNOSTIC BILATERAL MAMMOGRAM WITH TOMOSYNTHESIS AND CAD TECHNIQUE: Bilateral digital diagnostic mammography and breast tomosynthesis was performed. The images were evaluated with computer-aided detection. COMPARISON:  Previous exam(s). ACR Breast Density Category b: There are scattered areas of fibroglandular density. FINDINGS: A 9 mm group of coarse calcifications in the upper-outer right breast are mammographically stable. No new or suspicious mammographic findings are identified in the remainder of either breast. IMPRESSION: 1. Stable, probably benign right breast calcifications. Recommendation is for a final mammographic follow-up in 1 year. 2. No mammographic evidence of malignancy on the left. RECOMMENDATION: Bilateral diagnostic mammogram in 1 year. I have discussed the findings and recommendations with the patient. If applicable, a reminder letter will be sent to the patient regarding the next appointment. BI-RADS CATEGORY  3: Probably benign. Electronically Signed   By: Kristopher Oppenheim M.D.   On: 11/12/2020 14:23   CT Renal Stone Study  Result Date: 11/14/2020 CLINICAL DATA:  Right flank pain EXAM: CT ABDOMEN AND PELVIS WITHOUT CONTRAST TECHNIQUE: Multidetector CT imaging of the abdomen and pelvis was performed following the standard protocol without IV contrast.  COMPARISON:  11/10/2020 FINDINGS: Lower chest: Scarring in the lingula. Small hiatal hernia. No effusions. Hepatobiliary: Multiple gallstones within the gallbladder measuring up 2 17 mm. Small hypodensity in the right hepatic dome compatible with cyst. Pancreas: Partially calcified cystic mass again seen within the pancreatic head measures 4.2 cm, stable. No pancreatic ductal dilatation. Spleen: No focal abnormality.  Normal size. Adrenals/Urinary Tract: Multiple cystic lesions within the kidneys bilaterally. No hydronephrosis. Adrenal glands and urinary bladder unremarkable. Stomach/Bowel: Large stool burden throughout the colon. Normal appendix. Stomach is decompressed. Mildly prominent fluid-filled right abdominal small bowel loops. Terminal ileum is decompressed. Vascular/Lymphatic: No evidence of aneurysm or adenopathy. Reproductive: Fibroid uterus.  No adnexal mass. Other: No free fluid or free air. Musculoskeletal: No acute bony abnormality. IMPRESSION: Cholelithiasis. Stable partially calcified cystic mass within the pancreatic head. Stable bilateral renal cystic lesions. No renal or ureteral stones. No hydronephrosis. Large stool burden throughout the colon. Fibroid uterus. Electronically Signed   By: Rolm Baptise M.D.   On: 11/14/2020 23:55    Orson Eva, DO  [facility]  If 7PM-7AM, please contact night-coverage [email or web address] Password [facility] 11/15/2020, 8:34 AM   LOS: 0 days

## 2020-11-15 NOTE — ED Notes (Signed)
O2 turned off d/t pt being 98% on room air.

## 2020-11-15 NOTE — ED Notes (Signed)
Pt placed on 2L [place] d/t O2 dropping to 87% after Dilaudid.

## 2020-11-15 NOTE — Consult Note (Signed)
Referring Provider: [facility] Primary Care Physician:  Ladell Pier, MD Primary Gastroenterologist:  Dr. Gala Romney  Date of Admission: 11/14/20 Date of Consultation: 11/15/20  Reason for Consultation:  Abdominal pain  HPI:  Maria Murphy is a 65 y.o. female with a past medical history of peptic ulcer disease/duodenal ulcers (complicated by perforation requiring a Phillip Heal patch), prior PEs on Xarelto, hypertension, GERD, obesity, cholelithiasis.  She presented to emergency department with right flank pain on 324.  Pain was in the right upper quadrant radiating to the flank.  At home pain medication (oxycodone-acetaminophen) with no relief.  Noted nausea and vomiting.  No fevers.  Recent admission 3 11-3 13 due to acute GI bleed while on Xarelto and peptic ulcer disease/pancreatic mass/abdominal pain.  Of note, she does have an upcoming appointment in [facility] for endoscopic ultrasound to evaluate her pancreatic cysts/pancreatic mass.  In the emergency department her vitals were stable.  Lipase 32.  CT of the abdomen and pelvis showed cholelithiasis, stable partially calcified cystic mass in the pancreatic head, stable bilateral renal cyst, large stool burden throughout the colon, fibroid uterus.  She was medicated for pain and nausea and admitted for further work-up and treatment.  Other labs including CMP found no abnormal liver labs, no leukocytosis, stable hemoglobin, normal platelet count.  Today she states she is feeling better than yesterday.  Abdominal pain improved.  No nausea or vomiting today.  She has not had a bowel movement today.  She states over the past couple weeks she has been having bowel movements.  Denies any hematochezia or melena.  No other overt GI complaints today.  Past Medical History:  Diagnosis Date  . Collagen vascular disease ([place])   . Essential hypertension, benign 01/24/2015  . Foot ulcer ([place])   . GERD (gastroesophageal reflux disease)   .  Hypertension   . Memory loss   . PUD (peptic ulcer disease)    with perforation s/p exploratory laparotomy  . Pulmonary embolus ([place]) 04/2020  . Skin cancer   . Vision abnormalities     Past Surgical History:  Procedure Laterality Date  . CENTRAL LINE INSERTION Right 04/22/2020   Procedure: CENTRAL LINE INSERTION;  Surgeon: Virl Cagey, MD;  Location: [facility];  Service: General;  Laterality: Right;  . DIAGNOSTIC LAPAROSCOPY    . ESOPHAGOGASTRODUODENOSCOPY (EGD) WITH PROPOFOL N/A 07/10/2020   Procedure: ESOPHAGOGASTRODUODENOSCOPY (EGD) WITH PROPOFOL;  Surgeon: Rogene Houston, MD;  Location: [facility] END[facility] SU[facility];  Service: Endoscopy;  Laterality: N/A;  . ESOPHAGOGASTRODUODENOSCOPY (EGD) WITH PROPOFOL N/A 10/18/2020   Procedure: ESOPHAGOGASTRODUODENOSCOPY (EGD) WITH PROPOFOL;  Surgeon: Harvel Quale, MD;  Location: [facility] SU[facility];  Service: Gastroenterology;  Laterality: N/A;  . GASTRORRHAPHY  04/22/2020   Procedure: GASTRORRHAPHY;  Surgeon: Virl Cagey, MD;  Location: [facility];  Service: General;;  . IR CATHETER TUBE CHANGE  05/09/2020  . LAPAROTOMY N/A 04/22/2020   Procedure: EXPLORATORY LAPAROTOMY;  Surgeon: Virl Cagey, MD;  Location: [facility];  Service: General;  Laterality: N/A;  . TONSILLECTOMY      Prior to Admission medications   Medication Sig Start Date End Date Taking? Authorizing Provider  acetaminophen (TYLENOL) 500 MG tablet Take 1 tablet (500 mg total) by mouth every 6 (six) hours as needed for mild pain or fever. Patient taking differently: Take 1,000 mg by mouth as needed for mild pain or fever. 09/27/20  Yes Johnson, Clanford L, MD  amLODipine (NORVASC) 2.5 MG tablet Take 1 tablet (2.5 mg total) by  mouth daily. For BP 11/03/20  Yes Emokpae, Courage, MD  citalopram (CELEXA) 20 MG tablet Take 20 mg by mouth daily. 11/04/20  Yes [provider]  dicyclomine (BENTYL) 10 MG capsule Take 10 mg by mouth 2 (two) times daily. 11/04/20  Yes [provider]  donepezil (ARICEPT) 10 MG tablet Take 0.5-1 tablets (5-10 mg total) by mouth in the morning and at bedtime. Patient taking differently: Take 10 mg by mouth in the morning and at bedtime. 09/20/20  Yes Ladell Pier, MD  famotidine (PEPCID) 20 MG tablet Take 1 tablet (20 mg total) by mouth 2 (two) times daily. 11/11/20  Yes Sherwood Gambler, MD  ferrous sulfate 325 (65 FE) MG tablet Take 1 tablet (325 mg total) by mouth daily with breakfast. 11/03/20  Yes Emokpae, Courage, MD  gabapentin (NEURONTIN) 300 MG capsule Take 1 capsule (300 mg total) by mouth 2 (two) times daily. 10/08/20  Yes Ladell Pier, MD  lipase/protease/amylase (CREON) 36000 UNITS CPEP capsule Take 2 capsules (72,000 Units total) by mouth 3 (three) times daily with meals. May also take 1 capsule (36,000 Units total) as needed (with snacks). 10/16/20  Yes Erenest Rasher, PA-C  mirtazapine (REMERON) 15 MG tablet Take 1 tablet (15 mg total) by mouth at bedtime. 10/08/20  Yes Ladell Pier, MD  ondansetron (ZOFRAN ODT) 8 MG disintegrating tablet Take 1 tablet (8 mg total) by mouth every 8 (eight) hours as needed for nausea or vomiting. 10/16/20  Yes Erenest Rasher, PA-C  pantoprazole (PROTONIX) 40 MG tablet Take 1 tablet (40 mg total) by mouth 2 (two) times daily. 11/03/20 12/03/20 Yes Roxan Hockey, MD  promethazine (PHENERGAN) 25 MG suppository Place 1 suppository (25 mg total) rectally every 6 (six) hours as needed for nausea or vomiting. 11/11/20  Yes Sherwood Gambler, MD  promethazine (PHENERGAN) 25 MG tablet Take 1 tablet (25 mg total) by mouth every 6 (six) hours as needed for nausea or vomiting. 11/11/20  Yes Sherwood Gambler, MD  sucralfate (CARAFATE) 1 GM/10ML suspension Take 10 mLs (1 g total) by mouth 4 (four) times daily -  with meals and at bedtime. 11/03/20  Yes Emokpae, Courage, MD  traZODone (DESYREL) 100 MG tablet Take 100 mg by mouth at bedtime. 11/04/20  Yes [provider]  lactose free  nutrition (BOOST) LIQD Take 237 mLs by mouth 2 (two) times daily.    [provider]  rivaroxaban (XARELTO) 10 MG TABS tablet Take 1 tablet (10 mg total) by mouth every evening. 11/08/20   Roxan Hockey, MD    Current Facility-Administered Medications  Medication Dose Route Frequency Provider Last Rate Last Admin  . amLODipine (NORVASC) tablet 2.5 mg  2.5 mg Oral Daily Adefeso, Oladapo, DO   2.5 mg at 11/15/20 1034  . docusate sodium (COLACE) capsule 100 mg  100 mg Oral BID PRN Adefeso, Oladapo, DO      . enoxaparin (LOVENOX) injection 40 mg  40 mg Subcutaneous Q24H Adefeso, Oladapo, DO   40 mg at 11/15/20 1036  . HYDROmorphone (DILAUDID) injection 0.5 mg  0.5 mg Intravenous Q3H PRN Adefeso, Oladapo, DO      . ondansetron (ZOFRAN) injection 4 mg  4 mg Intravenous Q6H PRN Adefeso, Oladapo, DO      . pantoprazole (PROTONIX) injection 40 mg  40 mg Intravenous Q12H Adefeso, Oladapo, DO   40 mg at 11/15/20 1036  . [START ON 11/16/2020] pneumococcal 23 valent vaccine (PNEUMOVAX-23) injection 0.5 mL  0.5 mL Intramuscular Tomorrow-1000  Phineas Inches, MD      . polyethylene glycol [facility] Floria Raveling) packet 17 g  17 g Oral Daily Adefeso, Oladapo, DO   17 g at 11/15/20 1035  . sucralfate (CARAFATE) 1 GM/10ML suspension 1 g  1 g Oral TID WC & HS Adefeso, Oladapo, DO        Allergies as of 11/14/2020 - Review Complete 11/14/2020  Allergen Reaction Noted  . Wheat extract Swelling 10/29/2017    Family History  Problem Relation Age of Onset  . High Cholesterol Mother   . Hypertension Mother   . Arthritis/Rheumatoid Mother   . Alcohol abuse Father   . Hypertension Maternal Grandmother   . Colon cancer Neg Hx   . Colon polyps Neg Hx   . Inflammatory bowel disease Neg Hx     Social History   Socioeconomic History  . Marital status: Widowed    Spouse name: Not on file  . Number of children: 2  . Years of education: Not on file  . Highest education level: Not on file  Occupational  History  . Not on file  Tobacco Use  . Smoking status: Never Smoker  . Smokeless tobacco: Never Used  Vaping Use  . Vaping Use: Never used  Substance and Sexual Activity  . Alcohol use: Yes    Alcohol/week: 2.0 - 3.0 standard drinks    Types: 2 - 3 Glasses of wine per week  . Drug use: No  . Sexual activity: Not Currently    Birth control/protection: Post-menopausal  Other Topics Concern  . Not on file  Social History Narrative  . Not on file   Social Determinants of Health   Financial Resource Strain: Not on file  Food Insecurity: Not on file  Transportation Needs: Not on file  Physical Activity: Not on file  Stress: Not on file  Social Connections: Not on file  Intimate Partner Violence: Not on file    Review of Systems: General: Negative for anorexia, weight loss, fever, chills, fatigue, weakness. ENT: Negative for hoarseness, difficulty swallowing. CV: Negative for chest pain, angina, palpitations, peripheral edema.  Respiratory: Negative for dyspnea at rest, cough, sputum, wheezing.  GI: See history of present illness. Endo: Negative for unusual weight change.  Heme: Negative for bruising or bleeding. Allergy: Negative for rash or hives.  Physical Exam: Vital signs in last 24 hours: Temp:  [98.2 F (36.8 C)-98.6 F (37 C)] 98.2 F (36.8 C) (03/25 0852) Pulse Rate:  [57-92] 57 (03/25 0852) Resp:  [14-22] 16 (03/25 0830) BP: (137-181)/(74-98) 172/84 (03/25 0852) SpO2:  [92 %-100 %] 100 % (03/25 0852) Weight:  [79.4 kg] 79.4 kg (03/24 2241) Last BM Date: 11/14/20 General:   Alert,  Well-developed, well-nourished, pleasant and cooperative in NAD Head:  Normocephalic and atraumatic. Eyes:  Sclera clear, no icterus. Conjunctiva pink. Ears:  Normal auditory acuity. Lungs:  Clear throughout to auscultation. No wheezes, crackles, or rhonchi. No acute distress. Heart:  Regular rate and rhythm; no murmurs, clicks, rubs,  or gallops. Abdomen:  Soft, nontender and  nondistended. No masses, hepatosplenomegaly or hernias noted. Normal bowel sounds, without guarding, and without rebound.   Rectal:  Deferred.   Msk:  Symmetrical without gross deformities. Extremities:  Without clubbing or edema. Neurologic:  Alert and  oriented x4;  grossly normal neurologically. Psych:  Alert and cooperative. Normal mood and affect.  Intake/Output from previous day: 03/24 0701 - 03/25 0700 In: 1000 [IV Piggyback:1000] Out: -  Intake/Output this shift: No  intake/output data recorded.  Lab Results: Recent Labs    11/14/20 2329  WBC 9.2  HGB 10.4*  HCT 32.1*  PLT 250   BMET Recent Labs    11/14/20 2329  NA 137  K 3.8  CL 103  CO2 22  GLUCOSE 129*  BUN 14  CREATININE 0.69  CALCIUM 8.9   LFT Recent Labs    11/14/20 2329  PROT 7.2  ALBUMIN 4.1  AST 26  ALT 6  ALKPHOS 47  BILITOT 0.7   PT/INR No results for input(s): LABPROT, INR in the last 72 hours. Hepatitis Panel No results for input(s): HEPBSAG, HCVAB, HEPAIGM, HEPBIGM in the last 72 hours. C-Diff No results for input(s): CDIFFTOX in the last 72 hours.  Studies/Results: US Abdomen Limited  Result Date: 11/15/2020 CLINICAL DATA:  Right upper quadrant abdominal pain. EXAM: ULTRASOUND ABDOMEN LIMITED RIGHT UPPER QUADRANT COMPARISON:  November 14, 2020.  October 18, 2020. FINDINGS: Gallbladder: Cholelithiasis is noted as well as some degree of sludge within the gallbladder lumen. No gallbladder wall thickening or pericholecystic fluid is noted. The absence or presence of sonographic Murphy's sign was not reported by the technologist. Common bile duct: Diameter: 2 mm which is within normal limits. Liver: No focal lesion identified. Within normal limits in parenchymal echogenicity. Portal vein is patent on color Doppler imaging with normal direction of blood flow towards the liver. Other: None. IMPRESSION: Cholelithiasis and gallbladder sludge is noted without gallbladder wall thickening or  pericholecystic fluid. Electronically Signed   By: Marijo Conception M.D.   On: 11/15/2020 09:00   CT Renal Stone Study  Result Date: 11/14/2020 CLINICAL DATA:  Right flank pain EXAM: CT ABDOMEN AND PELVIS WITHOUT CONTRAST TECHNIQUE: Multidetector CT imaging of the abdomen and pelvis was performed following the standard protocol without IV contrast. COMPARISON:  11/10/2020 FINDINGS: Lower chest: Scarring in the lingula. Small hiatal hernia. No effusions. Hepatobiliary: Multiple gallstones within the gallbladder measuring up 2 17 mm. Small hypodensity in the right hepatic dome compatible with cyst. Pancreas: Partially calcified cystic mass again seen within the pancreatic head measures 4.2 cm, stable. No pancreatic ductal dilatation. Spleen: No focal abnormality.  Normal size. Adrenals/Urinary Tract: Multiple cystic lesions within the kidneys bilaterally. No hydronephrosis. Adrenal glands and urinary bladder unremarkable. Stomach/Bowel: Large stool burden throughout the colon. Normal appendix. Stomach is decompressed. Mildly prominent fluid-filled right abdominal small bowel loops. Terminal ileum is decompressed. Vascular/Lymphatic: No evidence of aneurysm or adenopathy. Reproductive: Fibroid uterus.  No adnexal mass. Other: No free fluid or free air. Musculoskeletal: No acute bony abnormality. IMPRESSION: Cholelithiasis. Stable partially calcified cystic mass within the pancreatic head. Stable bilateral renal cystic lesions. No renal or ureteral stones. No hydronephrosis. Large stool burden throughout the colon. Fibroid uterus. Electronically Signed   By: Rolm Baptise M.D.   On: 11/14/2020 23:55    Impression: Pleasant 65 year old female very well-known to our service who presents for recurrent right upper quadrant/right flank pain.  Known history of peptic ulcer disease complicated by perforation requiring a Phillip Heal patch.  Recently admitted for upper GI bleed on Xarelto with EGD in February finding erosions  in the gastric body with superficial gastric ulcer and clean base in the pylorus and a large duodenal ulcer of 35 mm in the duodenal bulb with presence of one suture crossing the ulcer.  CT scan essentially looks stable from all aspects other than new large stool burden throughout the colon.  Abdominal pain: Apparent acute on chronic abdominal pain.  Her abdomen  is soft on exam today, grossly nontender.  She is sitting the edge of bed and does not look like she is in significant pain or distress.  Her chronic pain is likely due to her known GI complications.  She does have an upcoming EUS to evaluate further and it is imperative that she makes this appointment on 11/20/2020.  Fortunately she remains on Protonix and Carafate at home and so I am less convinced that her peptic ulcer disease/duodenitis is the source of her acute pain.  Feel the most likely etiology is her significant constipation is noted on imaging.  This should at least be considered as a major contributor.  At this point I feel if we can get her to empty her bowels she will have at least some relief.  Pancreatic mass: Planned EUS in [place] on 11/20/2020 which is imperative that she attend.  Pancreatic mass query sidebranch IPMN.  The EUS and EGD will allow further evaluation of her pancreatic abnormalities and peptic ulcer disease status.  I do not feel there are significant changes on imaging or labs to suggest that these are contributing to her acute on chronic pain.  Plan: 1. Pain and nausea management per hospitalist 2. Bowel purge with Golytely bowel prep 3. Continue home PPI and Carafate 4. Supportive care 5. We will re-evaluate in the morning 6. All efforts should be made for her to make her appointment in [facility] 11/20/20 for EGD/EUS   Thank you for allowing [place] to participate in the care of [facility]  Walden Field, DNP, AGNP-C Adult & Gerontological Nurse Practitioner [facility]   LOS: 0  days     11/15/2020, 12:44 PM

## 2020-11-15 NOTE — ED Notes (Signed)
Dr. Josephine Cables at bedside assessing pt. Upon his arrival to room, pt woke up and started moaning again stating that she was in 10/10 pain. Prior to his arrival, pt was resting comfortably in bed.

## 2020-11-15 NOTE — ED Notes (Addendum)
Pt resting at this time and appears to be in NAD.

## 2020-11-15 NOTE — ED Notes (Signed)
Pt returned from CT °

## 2020-11-15 NOTE — Consult Note (Signed)
Reason for Consult:Right upper quadrant abdominal pain  Referring Physician: Dr. Barrie Dunker is an 65 y.o. female.  HPI: Patient is a 65 year old female well-known to our service with a history of cholelithiasis, status post exploratory laparotomy and Graham plication for perforated peptic ulcer disease in August 2021.Marland Kitchen  Her postoperative course was complicated by saddle pulmonary embolus.  She was treated with Xarelto.  Since that time, she has had multiple admissions for various complaints of upper abdominal pain, nausea, and hematemesis.  She has been followed by both general surgery and gastroenterology services.  She states this latest episode of right upper quadrant abdominal pain was more lateral in nature, thus that is why she presented to the emergency room.  She has been intermittently on and off her Xarelto.  She currently is off her Xarelto.  She is taking her Carafate and PPI as prescribed.  She currently states her right upper quadrant abdominal pain has resolved and she is hungry.  She denies any nausea.  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  She has had multiple studies concerning her gallbladder in occluding a normal HIDA scan in January of this year.  She has a known history of cholelithiasis.  Her liver enzyme tests were all within normal limits.  Past Medical History:  Diagnosis Date   Collagen vascular disease ([place])    Essential hypertension, benign 01/24/2015   Foot ulcer ([place])    GERD (gastroesophageal reflux disease)    Hypertension    Memory loss    PUD (peptic ulcer disease)    with perforation s/p exploratory laparotomy   Pulmonary embolus ([place]) 04/2020   Skin cancer    Vision abnormalities     Past Surgical History:  Procedure Laterality Date   CENTRAL LINE INSERTION Right 04/22/2020   Procedure: CENTRAL LINE INSERTION;  Surgeon: Virl Cagey, MD;  Location: [facility];  Service:  General;  Laterality: Right;   DIAGNOSTIC LAPAROSCOPY     ESOPHAGOGASTRODUODENOSCOPY (EGD) WITH PROPOFOL N/A 07/10/2020   Procedure: ESOPHAGOGASTRODUODENOSCOPY (EGD) WITH PROPOFOL;  Surgeon: Rogene Houston, MD;  Location: [facility];  Service: Endoscopy;  Laterality: N/A;   ESOPHAGOGASTRODUODENOSCOPY (EGD) WITH PROPOFOL N/A 10/18/2020   Procedure: ESOPHAGOGASTRODUODENOSCOPY (EGD) WITH PROPOFOL;  Surgeon: Harvel Quale, MD;  Location: [facility];  Service: Gastroenterology;  Laterality: N/A;   GASTRORRHAPHY  04/22/2020   Procedure: GASTRORRHAPHY;  Surgeon: Virl Cagey, MD;  Location: [facility];  Service: General;;   IR CATHETER TUBE CHANGE  05/09/2020   LAPAROTOMY N/A 04/22/2020   Procedure: EXPLORATORY LAPAROTOMY;  Surgeon: Virl Cagey, MD;  Location: [facility];  Service: General;  Laterality: N/A;   TONSILLECTOMY      Family History  Problem Relation Age of Onset   High Cholesterol Mother    Hypertension Mother    Arthritis/Rheumatoid Mother    Alcohol abuse Father    Hypertension Maternal Grandmother    Colon cancer Neg Hx    Colon polyps Neg Hx    Inflammatory bowel disease Neg Hx     Social History:  reports that she has never smoked. She has never used smokeless tobacco. She reports current alcohol use of about 2.0 - 3.0 standard drinks of alcohol per week. She reports that she does not use drugs.  Allergies:  Allergies  Allergen Reactions   Wheat Extract Swelling    Medications: I have reviewed the patient's current  medications.  Results for orders placed or performed during the hospital encounter of 11/14/20 (from the past 48 hour(s))  Comprehensive metabolic panel     Status: Abnormal   Collection Time: 11/14/20 11:29 PM  Result Value Ref Range   Sodium 137 135 - 145 mmol/L   Potassium 3.8 3.5 - 5.1 mmol/L    Comment: MODERATE HEMOLYSIS   Chloride 103 98 - 111 mmol/L   CO2 22 22 - 32 mmol/L   Glucose, Bld 129 (H) 70 - 99  mg/dL    Comment: Glucose reference range applies only to samples taken after fasting for at least 8 hours.   BUN 14 8 - 23 mg/dL   Creatinine, Ser 0.69 0.44 - 1.00 mg/dL   Calcium 8.9 8.9 - 10.3 mg/dL   Total Protein 7.2 6.5 - 8.1 g/dL   Albumin 4.1 3.5 - 5.0 g/dL   AST 26 15 - 41 U/L   ALT 6 0 - 44 U/L   Alkaline Phosphatase 47 38 - 126 U/L   Total Bilirubin 0.7 0.3 - 1.2 mg/dL   GFR, Estimated >60 >60 mL/min    Comment: (NOTE) Calculated using the CKD-EPI Creatinine Equation (2021)    Anion gap 12 5 - 15    Comment: Performed at [facility], [address], [place], [place] [address]  Lipase, blood     Status: None   Collection Time: 11/14/20 11:29 PM  Result Value Ref Range   Lipase 32 11 - 51 U/L    Comment: Performed at [facility], [address], [place], [place] [address]  CBC with Differential     Status: Abnormal   Collection Time: 11/14/20 11:29 PM  Result Value Ref Range   WBC 9.2 4.0 - 10.5 K/uL   RBC 3.77 (L) 3.87 - 5.11 MIL/uL   Hemoglobin 10.4 (L) 12.0 - 15.0 g/dL   HCT 32.1 (L) 36.0 - 46.0 %   MCV 85.1 80.0 - 100.0 fL   MCH 27.6 26.0 - 34.0 pg   MCHC 32.4 30.0 - 36.0 g/dL   RDW 15.9 (H) 11.5 - 15.5 %   Platelets 250 150 - 400 K/uL   nRBC 0.0 0.0 - 0.2 %   Neutrophils Relative % 82 %   Neutro Abs 7.6 1.7 - 7.7 K/uL   Lymphocytes Relative 12 %   Lymphs Abs 1.1 0.7 - 4.0 K/uL   Monocytes Relative 5 %   Monocytes Absolute 0.4 0.1 - 1.0 K/uL   Eosinophils Relative 1 %   Eosinophils Absolute 0.1 0.0 - 0.5 K/uL   Basophils Relative 0 %   Basophils Absolute 0.0 0.0 - 0.1 K/uL   Immature Granulocytes 0 %   Abs Immature Granulocytes 0.02 0.00 - 0.07 K/uL    Comment: Performed at [facility], [address], [place], [place] [address]  Urinalysis, Routine w reflex microscopic Urine, Clean Catch     Status: Abnormal   Collection Time: 11/15/20  1:02 AM  Result Value Ref Range   Color, Urine YELLOW YELLOW   APPearance HAZY (A) CLEAR   Specific Gravity, Urine  1.013 1.005 - 1.030   pH 7.0 5.0 - 8.0   Glucose, UA NEGATIVE NEGATIVE mg/dL   Hgb urine dipstick NEGATIVE NEGATIVE   Bilirubin Urine NEGATIVE NEGATIVE   Ketones, ur 20 (A) NEGATIVE mg/dL   Protein, ur NEGATIVE NEGATIVE mg/dL   Nitrite NEGATIVE NEGATIVE   Leukocytes,Ua MODERATE (A) NEGATIVE   RBC / HPF 0-5 0 - 5 RBC/hpf  WBC, UA 11-20 0 - 5 WBC/hpf   Bacteria, UA NONE SEEN NONE SEEN   Squamous Epithelial / LPF 0-5 0 - 5   Mucus PRESENT     Comment: Performed at [facility], [address], [place], [place] [address]  SARS CORONAVIRUS 2 (TAT 6-24 HRS) Nasopharyngeal Nasopharyngeal Swab     Status: None   Collection Time: 11/15/20  4:55 AM   Specimen: Nasopharyngeal Swab  Result Value Ref Range   SARS Coronavirus 2 NEGATIVE NEGATIVE    Comment: (NOTE) SARS-CoV-2 target nucleic acids are NOT DETECTED.  The SARS-CoV-2 RNA is generally detectable in upper and lower respiratory specimens during the acute phase of infection. Negative results do not preclude SARS-CoV-2 infection, do not rule out co-infections with other pathogens, and should not be used as the sole basis for treatment or other patient management decisions. Negative results must be combined with clinical observations, patient history, and epidemiological information. The expected result is Negative.  Fact Sheet for Patients: SugarRoll.be  Fact Sheet for Healthcare Providers: https://www.woods-mathews.com/  This test is not yet approved or cleared by the [place] FDA and  has been authorized for detection and/or diagnosis of SARS-CoV-2 by FDA under an Emergency Use Authorization (EUA). This EUA will remain  in effect (meaning this test can be used) for the duration of the COVID-19 declaration under Se ction 564(b)(1) of the Act, 21 U.S.C. section 360bbb-3(b)(1), unless the authorization is terminated or revoked sooner.  Performed at [facility], [place] [address] [place], [place] [address]     US Abdomen Limited  Result Date: 11/15/2020 CLINICAL DATA:  Right upper quadrant abdominal pain. EXAM: ULTRASOUND ABDOMEN LIMITED RIGHT UPPER QUADRANT COMPARISON:  November 14, 2020.  October 18, 2020. FINDINGS: Gallbladder: Cholelithiasis is noted as well as some degree of sludge within the gallbladder lumen. No gallbladder wall thickening or pericholecystic fluid is noted. The absence or presence of sonographic Murphy's sign was not reported by the technologist. Common bile duct: Diameter: 2 mm which is within normal limits. Liver: No focal lesion identified. Within normal limits in parenchymal echogenicity. Portal vein is patent on color Doppler imaging with normal direction of blood flow towards the liver. Other: None. IMPRESSION: Cholelithiasis and gallbladder sludge is noted without gallbladder wall thickening or pericholecystic fluid. Electronically Signed   By: Marijo Conception M.D.   On: 11/15/2020 09:00   CT Renal Stone Study  Result Date: 11/14/2020 CLINICAL DATA:  Right flank pain EXAM: CT ABDOMEN AND PELVIS WITHOUT CONTRAST TECHNIQUE: Multidetector CT imaging of the abdomen and pelvis was performed following the standard protocol without IV contrast. COMPARISON:  11/10/2020 FINDINGS: Lower chest: Scarring in the lingula. Small hiatal hernia. No effusions. Hepatobiliary: Multiple gallstones within the gallbladder measuring up 2 17 mm. Small hypodensity in the right hepatic dome compatible with cyst. Pancreas: Partially calcified cystic mass again seen within the pancreatic head measures 4.2 cm, stable. No pancreatic ductal dilatation. Spleen: No focal abnormality.  Normal size. Adrenals/Urinary Tract: Multiple cystic lesions within the kidneys bilaterally. No hydronephrosis. Adrenal glands and urinary bladder unremarkable. Stomach/Bowel: Large stool burden throughout the colon. Normal appendix. Stomach is decompressed. Mildly prominent fluid-filled right abdominal  small bowel loops. Terminal ileum is decompressed. Vascular/Lymphatic: No evidence of aneurysm or adenopathy. Reproductive: Fibroid uterus.  No adnexal mass. Other: No free fluid or free air. Musculoskeletal: No acute bony abnormality. IMPRESSION: Cholelithiasis. Stable partially calcified cystic mass within the pancreatic head. Stable bilateral renal cystic lesions. No renal or  ureteral stones. No hydronephrosis. Large stool burden throughout the colon. Fibroid uterus. Electronically Signed   By: Rolm Baptise M.D.   On: 11/14/2020 23:55    ROS:  Pertinent items are noted in HPI.  Blood pressure (!) 172/84, pulse (!) 57, temperature 98.2 F (36.8 C), temperature source Oral, resp. rate 16, height 5\' 4"  (1.626 m), weight 79.4 kg, SpO2 100 %. Physical Exam: Pleasant female no acute distress Head is normocephalic, atraumatic Eyes without scleral icterus Lungs clear to auscultation with good breath sounds bilaterally Heart examination reveals regular rate and rhythm without S3, S4, murmurs Abdomen is soft, nontender, nondistended.  No right upper quadrant pain was elicited to palpation.  Previous admission notes reviewed  Assessment/Plan: Impression: Chronic upper abdominal pain of unknown etiology.  It is probably multifactorial in nature and includes her history of peptic ulcer disease, pancreatic pseudocyst, and cholelithiasis.  There is no need for urgent cholecystectomy at this time.  Would like to see what the EUS shows.  Further management is pending those results.  May advance diet as tolerated.  Aviva Signs 11/15/2020, 3:45 PM

## 2020-11-16 ENCOUNTER — Inpatient Hospital Stay (HOSPITAL_COMMUNITY): Admission: RE | Admit: 2020-11-16 | Payer: Medicaid Other | Source: Ambulatory Visit

## 2020-11-16 DIAGNOSIS — Z86718 Personal history of other venous thrombosis and embolism: Secondary | ICD-10-CM

## 2020-11-16 DIAGNOSIS — Z86711 Personal history of pulmonary embolism: Secondary | ICD-10-CM

## 2020-11-16 DIAGNOSIS — I10 Essential (primary) hypertension: Secondary | ICD-10-CM

## 2020-11-16 DIAGNOSIS — R1011 Right upper quadrant pain: Secondary | ICD-10-CM

## 2020-11-16 DIAGNOSIS — K8020 Calculus of gallbladder without cholecystitis without obstruction: Secondary | ICD-10-CM

## 2020-11-16 DIAGNOSIS — R109 Unspecified abdominal pain: Secondary | ICD-10-CM

## 2020-11-16 DIAGNOSIS — K8689 Other specified diseases of pancreas: Secondary | ICD-10-CM

## 2020-11-16 DIAGNOSIS — E669 Obesity, unspecified: Secondary | ICD-10-CM

## 2020-11-16 DIAGNOSIS — D649 Anemia, unspecified: Secondary | ICD-10-CM

## 2020-11-16 LAB — COMPREHENSIVE METABOLIC PANEL
ALT: 7 U/L (ref 0–44)
AST: 13 U/L — ABNORMAL LOW (ref 15–41)
Albumin: 3.1 g/dL — ABNORMAL LOW (ref 3.5–5.0)
Alkaline Phosphatase: 38 U/L (ref 38–126)
Anion gap: 9 (ref 5–15)
BUN: 7 mg/dL — ABNORMAL LOW (ref 8–23)
CO2: 25 mmol/L (ref 22–32)
Calcium: 8.5 mg/dL — ABNORMAL LOW (ref 8.9–10.3)
Chloride: 107 mmol/L (ref 98–111)
Creatinine, Ser: 0.61 mg/dL (ref 0.44–1.00)
GFR, Estimated: >60 mL/min (ref >60)
Glucose, Bld: 94 mg/dL (ref 70–99)
Potassium: 3.4 mmol/L — ABNORMAL LOW (ref 3.5–5.1)
Sodium: 141 mmol/L (ref 135–145)
Total Bilirubin: 0.5 mg/dL (ref 0.3–1.2)
Total Protein: 5.6 g/dL — ABNORMAL LOW (ref 6.5–8.1)

## 2020-11-16 LAB — CBC
HCT: 30 % — ABNORMAL LOW (ref 36.0–46.0)
Hemoglobin: 9.2 g/dL — ABNORMAL LOW (ref 12.0–15.0)
MCH: 27.2 pg (ref 26.0–34.0)
MCHC: 30.7 g/dL (ref 30.0–36.0)
MCV: 88.8 fL (ref 80.0–100.0)
Platelets: 198 10*3/uL (ref 150–400)
RBC: 3.38 MIL/uL — ABNORMAL LOW (ref 3.87–5.11)
RDW: 16 % — ABNORMAL HIGH (ref 11.5–15.5)
WBC: 3.4 10*3/uL — ABNORMAL LOW (ref 4.0–10.5)
nRBC: 0 % (ref 0.0–0.2)

## 2020-11-16 LAB — APTT: aPTT: 34 seconds (ref 24–36)

## 2020-11-16 LAB — PROTIME-INR
INR: 1.1 (ref 0.8–1.2)
Prothrombin Time: 13.3 seconds (ref 11.4–15.2)

## 2020-11-16 MED ORDER — POTASSIUM CHLORIDE 10 MEQ/100ML IV SOLN
10.0000 meq | Freq: Once | INTRAVENOUS | Status: AC
  Administered 2020-11-16: 10 meq via INTRAVENOUS
  Filled 2020-11-16: qty 100

## 2020-11-16 NOTE — Progress Notes (Signed)
Subjective: States her right-sided abdominal pain is intermittent in nature.  Has had multiple bowel movements yesterday.  Objective: Vital signs in last 24 hours: Temp:  [98 F (36.7 C)-98.8 F (37.1 C)] 98 F (36.7 C) (03/26 0441) Pulse Rate:  [53-66] 53 (03/26 0441) Resp:  [16-20] 16 (03/26 0441) BP: (126-141)/(62-68) 132/68 (03/26 0441) SpO2:  [97 %-100 %] 97 % (03/26 0441) Last BM Date: 11/15/20  Intake/Output from previous day: 03/25 0701 - 03/26 0700 In: 480 [P.O.:480] Out: -  Intake/Output this shift: No intake/output data recorded.  General appearance: alert, cooperative and no distress GI: Soft with minimal tenderness in the lateral right subcostal region.  No rigidity is noted.  Lab Results:  Recent Labs    11/14/20 2329 11/16/20 0429  WBC 9.2 3.4*  HGB 10.4* 9.2*  HCT 32.1* 30.0*  PLT 250 198   BMET Recent Labs    11/14/20 2329 11/16/20 0429  NA 137 141  K 3.8 3.4*  CL 103 107  CO2 22 25  GLUCOSE 129* 94  BUN 14 7*  CREATININE 0.69 0.61  CALCIUM 8.9 8.5*   PT/INR Recent Labs    11/16/20 0429  LABPROT 13.3  INR 1.1    Studies/Results: US Abdomen Limited  Result Date: 11/15/2020 CLINICAL DATA:  Right upper quadrant abdominal pain. EXAM: ULTRASOUND ABDOMEN LIMITED RIGHT UPPER QUADRANT COMPARISON:  November 14, 2020.  October 18, 2020. FINDINGS: Gallbladder: Cholelithiasis is noted as well as some degree of sludge within the gallbladder lumen. No gallbladder wall thickening or pericholecystic fluid is noted. The absence or presence of sonographic Murphy's sign was not reported by the technologist. Common bile duct: Diameter: 2 mm which is within normal limits. Liver: No focal lesion identified. Within normal limits in parenchymal echogenicity. Portal vein is patent on color Doppler imaging with normal direction of blood flow towards the liver. Other: None. IMPRESSION: Cholelithiasis and gallbladder sludge is noted without gallbladder wall  thickening or pericholecystic fluid. Electronically Signed   By: Marijo Conception M.D.   On: 11/15/2020 09:00   CT Renal Stone Study  Result Date: 11/14/2020 CLINICAL DATA:  Right flank pain EXAM: CT ABDOMEN AND PELVIS WITHOUT CONTRAST TECHNIQUE: Multidetector CT imaging of the abdomen and pelvis was performed following the standard protocol without IV contrast. COMPARISON:  11/10/2020 FINDINGS: Lower chest: Scarring in the lingula. Small hiatal hernia. No effusions. Hepatobiliary: Multiple gallstones within the gallbladder measuring up 2 17 mm. Small hypodensity in the right hepatic dome compatible with cyst. Pancreas: Partially calcified cystic mass again seen within the pancreatic head measures 4.2 cm, stable. No pancreatic ductal dilatation. Spleen: No focal abnormality.  Normal size. Adrenals/Urinary Tract: Multiple cystic lesions within the kidneys bilaterally. No hydronephrosis. Adrenal glands and urinary bladder unremarkable. Stomach/Bowel: Large stool burden throughout the colon. Normal appendix. Stomach is decompressed. Mildly prominent fluid-filled right abdominal small bowel loops. Terminal ileum is decompressed. Vascular/Lymphatic: No evidence of aneurysm or adenopathy. Reproductive: Fibroid uterus.  No adnexal mass. Other: No free fluid or free air. Musculoskeletal: No acute bony abnormality. IMPRESSION: Cholelithiasis. Stable partially calcified cystic mass within the pancreatic head. Stable bilateral renal cystic lesions. No renal or ureteral stones. No hydronephrosis. Large stool burden throughout the colon. Fibroid uterus. Electronically Signed   By: Rolm Baptise M.D.   On: 11/14/2020 23:55    Anti-infectives: Anti-infectives (From admission, onward)   None      Assessment/Plan: Impression: Chronic abdominal pain.  No nausea or vomiting noted. Plan: Will advance to soft diet.  If she is able to tolerate this over the next 24 hours, she is okay for discharge.  We will see her in my  office after her EUS next week.  LOS: 1 day    Aviva Signs 11/16/2020

## 2020-11-16 NOTE — Progress Notes (Addendum)
[facility]  PROGRESS NOTE  Der Gagliano QBH:[id number] DOB: [birth year] DOA: 11/14/2020 PCP: Ladell Pier, MD   Brief HPI:   65 year old female with history of hypertension, peptic ulcer disease with perforation, saddle pulmonary embolus rivaroxaban until recently on 11/03/2020, depression presented with 1 day history of intermittent abdominal pain, nausea vomiting.  Pain located in right upper quadrant.  Associated with nausea and vomiting.  Denies any NSAID use. She was recently mated to the hospital from 11/01/2020 to 11/04/2018 for GI bleed.  At that time she was transferred [facility].  She was discharged with hemoglobin of 9.3.  At that time the decision was made to discontinue her therapeutic dose of Lovenox event as patient was initially diagnosed with PE on 05/01/2020 had endorsed good compliance with her oxygen until beginning of November 2021 after which she has been taking it faithfully.  Targeted intake for stopping anticoagulation was in April 2022 however given the patient's likely GI bleed because fact that she received 5 months of rivaroxaban the dose of rivaroxaban was cut down to 10 mg daily at the time of discharge.  However patient was not taking Xarelto due to some type of misunderstanding.   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  There was also prominent main pancreatic duct.  Inflamed appearance of distal stomach and proximal duodenum with adjacent fat stranding that was increased since 09/24/2020.  Mild inflamed appearance of gallbladder controlled hepatitis.  Notably, the patient has had a complicated history of peptic ulcer disease secondary to NSAID use. She had a prolonged hospitalization from 04/21/2020 to 05/17/2020 secondary to a perforated gastric ulcer. She underwent exploratory laparotomy with Phillip Heal patch placed on 04/22/2021. This was complicated by intra-abdominal  abscess for which required IR aspiration. That hospitalization was also complicated by a pulmonary embolus for which the patient was started on rivaroxaban.    CT scan of the abdomen/pelvis obtained during this admission cholelithiasis, large stool burden throughout colon.  Stable partially calcified cystic mass within the pancreatic head.  No other significant abnormalities.   Subjective   Patient seen and examined, continues to have right upper quadrant pain.  Had multiple BMs this morning after she was given GoLYTELY per GI.   Assessment/Plan:     1. Right upper quadrant pain-patient has history of peptic ulcer disease, previously she had inflamed duodenum seen on CT scan 10/15/2020, however CT scan abdomen during this admission did not show any significant abnormality.  Patient is supposed to undergo endoscopic ultrasound on Wednesday next week, GI recommends to also do endoscopy at the same time.  In the meantime continue Protonix 40 mg IV every 12 hours, sucralfate 1 g 3 times daily and nightly. 2. Hypertension-blood pressure stable, continue amlodipine. 3. Cholelithiasis-patient was seen by general surgery, no plan for surgical intervention.  Plan for outpatient follow-up with general surgery after endoscopic ultrasound next week. 4. 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 5. History of pulmonary embolism-patient had submassive/saddle embolus on CTA on 05/01/2020.  She was started on Xarelto but she is  currently not on Xarelto, as it was stopped on 3/132022 due to GI bleed.  She has completed 5 months of treatment. 6. Depression-continue Celexa, trazodone 7. Anemia-hemoglobin dropped to 9.2 this morning.  Will follow CBC in a.m. 8. Hypokalemia-potassium is 3.4, will replace potassium and follow BMP in am.  Scheduled medications:   . amLODipine  2.5 mg Oral Daily  . enoxaparin (LOVENOX) injection  40 mg Subcutaneous Q24H  . pantoprazole (PROTONIX) IV  40  mg Intravenous Q12H  . sucralfate  1 g Oral TID WC & HS         Data Reviewed:   CBG:  No results for input(s): GLUCAP in the last 168 hours.  SpO2: 98 %    Vitals:   11/15/20 1637 11/15/20 2017 11/16/20 0441 11/16/20 1410  BP: (!) 141/62 126/63 132/68 131/75  Pulse: 66 61 (!) 53 67  Resp: 20 18 16 20   Temp: 98.3 F (36.8 C) 98.8 F (37.1 C) 98 F (36.7 C) 98.2 F (36.8 C)  TempSrc: Oral Oral Oral Oral  SpO2: 100% 97% 97% 98%  Weight:      Height:         Intake/Output Summary (Last 24 hours) at 11/16/2020 1526 Last data filed at 11/16/2020 1518 Gross per 24 hour  Intake 738.16 ml  Output --  Net 738.16 ml    03/24 1901 - 03/26 0700 In: 1480 [P.O.:480] Out: -   Filed Weights   11/14/20 2241  Weight: 79.4 kg    CBC:  Recent Labs  Lab 11/10/20 0430 11/11/20 0648 11/14/20 2329 11/16/20 0429  WBC 6.4 8.1 9.2 3.4*  HGB 10.2* 10.1* 10.4* 9.2*  HCT 31.6* 31.0* 32.1* 30.0*  PLT 216 209 250 198  MCV 87.1 86.6 85.1 88.8  MCH 28.1 28.2 27.6 27.2  MCHC 32.3 32.6 32.4 30.7  RDW 16.0* 15.6* 15.9* 16.0*  LYMPHSABS 1.4 0.9 1.1  --   MONOABS 0.5 0.4 0.4  --   EOSABS 0.1 0.0 0.1  --   BASOSABS 0.0 0.0 0.0  --     Complete metabolic panel:  Recent Labs  Lab 11/10/20 0430 11/11/20 0648 11/14/20 2329 11/16/20 0429  NA 140 138 137 141  K 3.8 3.7 3.8 3.4*  CL 109 106 103 107  CO2 23 24 22 25   GLUCOSE 122* 139* 129* 94  BUN 12 7* 14 7*  CREATININE 0.66 0.64 0.69 0.61  CALCIUM 9.0 8.8* 8.9 8.5*  AST 19 17 26  13*  ALT 7 8 6 7   ALKPHOS 48 46 47 38  BILITOT 0.4 0.5 0.7 0.5  ALBUMIN 4.0 3.7 4.1 3.1*  INR  --   --   --  1.1    Recent Labs  Lab 11/10/20 0430 11/11/20 0648 11/14/20 2329  LIPASE 29 39 32    Recent Labs  Lab 11/15/20 0455  SARSCOV2NAA NEGATIVE    ------------------------------------------------------------------------------------------------------------------ No results for input(s): CHOL, HDL, LDLCALC, TRIG, CHOLHDL,  LDLDIRECT in the last 72 hours.  No results found for: HGBA1C ------------------------------------------------------------------------------------------------------------------ No results for input(s): TSH, T4TOTAL, T3FREE, THYROIDAB in the last 72 hours.  Invalid input(s): FREET3 ------------------------------------------------------------------------------------------------------------------ No results for input(s): VITAMINB12, FOLATE, FERRITIN, TIBC, IRON, RETICCTPCT in the last 72 hours.  Coagulation profile  Recent Labs  Lab 11/16/20 0429  INR 1.1    No results for input(s): DDIMER in the last 72 hours.  Cardiac Enzymes  No results for input(s): CKMB, TROPONINI, MYOGLOBIN in the last 168 hours.  Invalid input(s): CK ------------------------------------------------------------------------------------------------------------------ No results found for: BNP   Antibiotics: Anti-infectives (From admission, onward)   None       Radiology Reports  US Abdomen Limited  Result Date: 11/15/2020 CLINICAL DATA:  Right upper quadrant abdominal pain. EXAM: ULTRASOUND ABDOMEN LIMITED RIGHT UPPER QUADRANT COMPARISON:  November 14, 2020.  October 18, 2020. FINDINGS: Gallbladder: Cholelithiasis is  noted as well as some degree of sludge within the gallbladder lumen. No gallbladder wall thickening or pericholecystic fluid is noted. The absence or presence of sonographic Murphy's sign was not reported by the technologist. Common bile duct: Diameter: 2 mm which is within normal limits. Liver: No focal lesion identified. Within normal limits in parenchymal echogenicity. Portal vein is patent on color Doppler imaging with normal direction of blood flow towards the liver. Other: None. IMPRESSION: Cholelithiasis and gallbladder sludge is noted without gallbladder wall thickening or pericholecystic fluid. Electronically Signed   By: Marijo Conception M.D.   On: 11/15/2020 09:00   CT Renal Stone  Study  Result Date: 11/14/2020 CLINICAL DATA:  Right flank pain EXAM: CT ABDOMEN AND PELVIS WITHOUT CONTRAST TECHNIQUE: Multidetector CT imaging of the abdomen and pelvis was performed following the standard protocol without IV contrast. COMPARISON:  11/10/2020 FINDINGS: Lower chest: Scarring in the lingula. Small hiatal hernia. No effusions. Hepatobiliary: Multiple gallstones within the gallbladder measuring up 2 17 mm. Small hypodensity in the right hepatic dome compatible with cyst. Pancreas: Partially calcified cystic mass again seen within the pancreatic head measures 4.2 cm, stable. No pancreatic ductal dilatation. Spleen: No focal abnormality.  Normal size. Adrenals/Urinary Tract: Multiple cystic lesions within the kidneys bilaterally. No hydronephrosis. Adrenal glands and urinary bladder unremarkable. Stomach/Bowel: Large stool burden throughout the colon. Normal appendix. Stomach is decompressed. Mildly prominent fluid-filled right abdominal small bowel loops. Terminal ileum is decompressed. Vascular/Lymphatic: No evidence of aneurysm or adenopathy. Reproductive: Fibroid uterus.  No adnexal mass. Other: No free fluid or free air. Musculoskeletal: No acute bony abnormality. IMPRESSION: Cholelithiasis. Stable partially calcified cystic mass within the pancreatic head. Stable bilateral renal cystic lesions. No renal or ureteral stones. No hydronephrosis. Large stool burden throughout the colon. Fibroid uterus. Electronically Signed   By: Rolm Baptise M.D.   On: 11/14/2020 23:55      DVT prophylaxis: SCDs  Code Status: Full code  Family Communication: No family at bedside   Consultants:  General surgery  Gastroenterology  Procedures:      Objective    Physical Examination:    General-appears in no acute distress  Heart-S1-S2, regular, no murmur auscultated  Lungs-clear to auscultation bilaterally, no wheezing or crackles auscultated  Abdomen-soft, nontender, no  organomegaly  Extremities-no edema in the lower extremities  Neuro-alert, oriented x3, no focal deficit noted   Status is: Inpatient  Dispo: The patient is from: Home              Anticipated d/c is to: Home              Anticipated d/c date is: 11/17/2020              Patient currently not stable for discharge  Barrier to discharge-ongoing rectal quadrant pain  COVID-19 Labs  No results for input(s): DDIMER, FERRITIN, LDH, CRP in the last 72 hours.  Lab Results  Component Value Date   [place] NEGATIVE 11/15/2020   [place] NEGATIVE 11/02/2020   [place] NEGATIVE 10/18/2020   [place] NEGATIVE 09/24/2020    Microbiology  Recent Results (from the past 240 hour(s))  SARS CORONAVIRUS 2 (TAT 6-24 HRS) Nasopharyngeal Nasopharyngeal Swab     Status: None   Collection Time: 11/15/20  4:55 AM   Specimen: Nasopharyngeal Swab  Result Value Ref Range Status   SARS Coronavirus 2 NEGATIVE NEGATIVE Final    Comment: (NOTE) SARS-CoV-2 target nucleic acids are NOT DETECTED.  The SARS-CoV-2 RNA is generally detectable in  upper and lower respiratory specimens during the acute phase of infection. Negative results do not preclude SARS-CoV-2 infection, do not rule out co-infections with other pathogens, and should not be used as the sole basis for treatment or other patient management decisions. Negative results must be combined with clinical observations, patient history, and epidemiological information. The expected result is Negative.  Fact Sheet for Patients: SugarRoll.be  Fact Sheet for Healthcare Providers: https://www.woods-mathews.com/  This test is not yet approved or cleared by the [place] FDA and  has been authorized for detection and/or diagnosis of SARS-CoV-2 by FDA under an Emergency Use Authorization (EUA). This EUA will remain  in effect (meaning this test can be used) for the duration of the COVID-19  declaration under Se ction 564(b)(1) of the Act, 21 U.S.C. section 360bbb-3(b)(1), unless the authorization is terminated or revoked sooner.  Performed at [facility], [place] [address] [place], [place] [address]              [facility] If 7PM-7AM, please contact night-coverage at [email or web address], Office  [phone]   11/16/2020, 3:26 PM  LOS: 1 day

## 2020-11-16 NOTE — Progress Notes (Signed)
Subjective:  Patient continues to complain of pain in right upper quadrant radiating to right flank.  She says she did not get any food.  She denies nausea or vomiting.  She says she had 4 bowel movements and she took GoLYTELY.  She passed small fecal balls and liquid stool.  It was not black or red. Patient states her pain is about the same as on admission.  It drops from 8 down to 6 after pain medication. Patient says she lives alone.  She generally eats 2 meals a day with snacks.  She essentially eats frozen foods.  Current Medications:  Current Facility-Administered Medications:  .  amLODipine (NORVASC) tablet 2.5 mg, 2.5 mg, Oral, Daily, Adefeso, Oladapo, DO, 2.5 mg at 11/16/20 0931 .  docusate sodium (COLACE) capsule 100 mg, 100 mg, Oral, BID PRN, Adefeso, Oladapo, DO .  enoxaparin (LOVENOX) injection 40 mg, 40 mg, Subcutaneous, Q24H, Adefeso, Oladapo, DO, 40 mg at 11/15/20 1036 .  HYDROmorphone (DILAUDID) injection 0.5 mg, 0.5 mg, Intravenous, Q3H PRN, Adefeso, Oladapo, DO, 0.5 mg at 11/16/20 0925 .  ondansetron (ZOFRAN) injection 4 mg, 4 mg, Intravenous, Q6H PRN, Adefeso, Oladapo, DO .  pantoprazole (PROTONIX) injection 40 mg, 40 mg, Intravenous, Q12H, Adefeso, Oladapo, DO, 40 mg at 11/16/20 1117 .  potassium chloride 10 mEq in 100 mL IVPB, 10 mEq, Intravenous, Once, [place], Marge Duncans, MD .  sucralfate (CARAFATE) 1 GM/10ML suspension 1 g, 1 g, Oral, TID WC & HS, Adefeso, Oladapo, DO, 1 g at 11/16/20 0932  Objective: Blood pressure 132/68, pulse (!) 53, temperature 98 F (36.7 C), temperature source Oral, resp. rate 16, height $RemoveBe'5\' 4"'hGCleqpnY$  (1.626 m), weight 79.4 kg, SpO2 97 %. Patient is alert and in no acute distress. Conjunctiva is pink. Sclera is nonicteric Oropharyngeal mucosa is normal. No neck masses or thyromegaly noted. Cardiac exam with regular rhythm normal S1 and S2. No murmur or gallop noted. Lungs are clear to auscultation. Abdomen;  No LE edema or clubbing  noted.  Labs/studies Results:  CBC Latest Ref Rng & Units 11/16/2020 11/14/2020 11/11/2020  WBC 4.0 - 10.5 K/uL 3.4(L) 9.2 8.1  Hemoglobin 12.0 - 15.0 g/dL 9.2(L) 10.4(L) 10.1(L)  Hematocrit 36.0 - 46.0 % 30.0(L) 32.1(L) 31.0(L)  Platelets 150 - 400 K/uL 198 250 209    CMP Latest Ref Rng & Units 11/16/2020 11/14/2020 11/11/2020  Glucose 70 - 99 mg/dL 94 129(H) 139(H)  BUN 8 - 23 mg/dL 7(L) 14 7(L)  Creatinine 0.44 - 1.00 mg/dL 0.61 0.69 0.64  Sodium 135 - 145 mmol/L 141 137 138  Potassium 3.5 - 5.1 mmol/L 3.4(L) 3.8 3.7  Chloride 98 - 111 mmol/L 107 103 106  CO2 22 - 32 mmol/L $RemoveB'25 22 24  'UrkjNVqs$ Calcium 8.9 - 10.3 mg/dL 8.5(L) 8.9 8.8(L)  Total Protein 6.5 - 8.1 g/dL 5.6(L) 7.2 6.5  Total Bilirubin 0.3 - 1.2 mg/dL 0.5 0.7 0.5  Alkaline Phos 38 - 126 U/L 38 47 46  AST 15 - 41 U/L 13(L) 26 17  ALT 0 - 44 U/L $Remo'7 6 8    'GDiCk$ Hepatic Function Latest Ref Rng & Units 11/16/2020 11/14/2020 11/11/2020  Total Protein 6.5 - 8.1 g/dL 5.6(L) 7.2 6.5  Albumin 3.5 - 5.0 g/dL 3.1(L) 4.1 3.7  AST 15 - 41 U/L 13(L) 26 17  ALT 0 - 44 U/L $Remo'7 6 8  'MTkUe$ Alk Phosphatase 38 - 126 U/L 38 47 46  Total Bilirubin 0.3 - 1.2 mg/dL 0.5 0.7 0.5    Serum lipase 32 on  11/14/2020. Serum lipase has been normal previously as well.  Assessment:  #1.  Abdominal pain.  Patient points to right upper quadrant right flank as a site of pain.  She is tender below the right costal margin with some guarding and in epigastric region.  She has known peptic ulcer disease.  EGD 1 month ago revealed superficial gastric ulcer with deep duodenal bulbar ulcer without stricture.  She also has cholelithiasis and multi-cystic/calcified pancreatic head mass.  Therefore she has multiple reasons to have pain.  She is scheduled for pancreatic endoscopic ultrasound next week.  Would also like Dr. Ardis Hughs to do EGD as well. If duodenal ulcer has not healed she may need surgical intervention but first need to make sure pancreatic lesion is benign. Pain has not improved  following purge with GoLYTELY.  Therefore pain not due to constipation.  #2.  Anemia.  Hemoglobin has dropped by 1.2 g in the last 2 days but no evidence of overt GI bleed.  Drop possibly due to hydration.  #3.  History of pulmonary embolism.  Patient is on enoxaparin.  At home she has been Xarelto.  Plan:  CBC in a.m. If pain persists may consider HIDA scan.  Will discuss with Dr. Arnoldo Morale.

## 2020-11-17 LAB — BASIC METABOLIC PANEL
Anion gap: 8 (ref 5–15)
BUN: 10 mg/dL (ref 8–23)
CO2: 24 mmol/L (ref 22–32)
Calcium: 8.4 mg/dL — ABNORMAL LOW (ref 8.9–10.3)
Chloride: 106 mmol/L (ref 98–111)
Creatinine, Ser: 0.73 mg/dL (ref 0.44–1.00)
GFR, Estimated: >60 mL/min (ref >60)
Glucose, Bld: 93 mg/dL (ref 70–99)
Potassium: 3.3 mmol/L — ABNORMAL LOW (ref 3.5–5.1)
Sodium: 138 mmol/L (ref 135–145)

## 2020-11-17 LAB — MAGNESIUM: Magnesium: 2 mg/dL (ref 1.7–2.4)

## 2020-11-17 LAB — CBC
HCT: 29 % — ABNORMAL LOW (ref 36.0–46.0)
Hemoglobin: 9.1 g/dL — ABNORMAL LOW (ref 12.0–15.0)
MCH: 27.6 pg (ref 26.0–34.0)
MCHC: 31.4 g/dL (ref 30.0–36.0)
MCV: 87.9 fL (ref 80.0–100.0)
Platelets: 188 10*3/uL (ref 150–400)
RBC: 3.3 MIL/uL — ABNORMAL LOW (ref 3.87–5.11)
RDW: 15.8 % — ABNORMAL HIGH (ref 11.5–15.5)
WBC: 4.1 10*3/uL (ref 4.0–10.5)
nRBC: 0 % (ref 0.0–0.2)

## 2020-11-17 LAB — URINE CULTURE: Culture: 40000 — AB

## 2020-11-17 MED ORDER — POTASSIUM CHLORIDE 10 MEQ/100ML IV SOLN
10.0000 meq | INTRAVENOUS | Status: AC
Start: 2020-11-17 — End: 2020-11-17
  Administered 2020-11-17 (×2): 10 meq via INTRAVENOUS
  Filled 2020-11-17 (×2): qty 100

## 2020-11-17 NOTE — Plan of Care (Signed)

## 2020-11-17 NOTE — Progress Notes (Signed)
Subjective:  Patient says she vomited last night.  She says she vomited all the food that she had eaten.  This morning she had 4 bites of her breakfast which consisted of eggs and sausage and is she vomited.  She continues to complain of pain in epigastrium and right upper quadrant.  She says pain is not any better or worse.  She is passing flatus.  He had 10 or 12 loose stools yesterday.  No melena or rectal bleeding reported.  Patient states her abdominal pain does not increase in severity when she coughs or takes a deep breath.  Current Medications:  Current Facility-Administered Medications:  .  amLODipine (NORVASC) tablet 2.5 mg, 2.5 mg, Oral, Daily, Adefeso, Oladapo, DO, 2.5 mg at 11/17/20 0903 .  docusate sodium (COLACE) capsule 100 mg, 100 mg, Oral, BID PRN, Adefeso, Oladapo, DO .  enoxaparin (LOVENOX) injection 40 mg, 40 mg, Subcutaneous, Q24H, Adefeso, Oladapo, DO, 40 mg at 11/17/20 0904 .  HYDROmorphone (DILAUDID) injection 0.5 mg, 0.5 mg, Intravenous, Q3H PRN, Adefeso, Oladapo, DO, 0.5 mg at 11/17/20 1035 .  ondansetron (ZOFRAN) injection 4 mg, 4 mg, Intravenous, Q6H PRN, Adefeso, Oladapo, DO, 4 mg at 11/17/20 0820 .  pantoprazole (PROTONIX) injection 40 mg, 40 mg, Intravenous, Q12H, Adefeso, Oladapo, DO, 40 mg at 11/17/20 0904 .  potassium chloride 10 mEq in 100 mL IVPB, 10 mEq, Intravenous, Q1 Hr x 2, [place], Marge Duncans, MD, Last Rate: 100 mL/hr at 11/17/20 1101, 10 mEq at 11/17/20 1101 .  sucralfate (CARAFATE) 1 GM/10ML suspension 1 g, 1 g, Oral, TID WC & HS, Adefeso, Oladapo, DO, 1 g at 11/17/20 1133  Objective: Blood pressure 113/61, pulse 63, temperature 98.1 F (36.7 C), temperature source Oral, resp. rate 18, height 5' 4" (1.626 m), weight 79.4 kg, SpO2 95 %. Patient is alert and appears to be comfortable. Cardiac exam with regular rhythm normal S1 and S2.  No murmur gallop noted. Auscultation lungs reveal vesicular breath sounds bilaterally.  No rales or rhonchi. Abdomen is  full.  Bowel sounds are normal.  On palpation soft abdomen moderate tenderness in midepigastric region and right upper quadrant.  She does not guard like she did yesterday.  No organomegaly or masses.  Labs/studies Results:   CBC Latest Ref Rng & Units 11/17/2020 11/16/2020 11/14/2020  WBC 4.0 - 10.5 K/uL 4.1 3.4(L) 9.2  Hemoglobin 12.0 - 15.0 g/dL 9.1(L) 9.2(L) 10.4(L)  Hematocrit 36.0 - 46.0 % 29.0(L) 30.0(L) 32.1(L)  Platelets 150 - 400 K/uL 188 198 250    CMP Latest Ref Rng & Units 11/17/2020 11/16/2020 11/14/2020  Glucose 70 - 99 mg/dL 93 94 129(H)  BUN 8 - 23 mg/dL 10 7(L) 14  Creatinine 0.44 - 1.00 mg/dL 0.73 0.61 0.69  Sodium 135 - 145 mmol/L 138 141 137  Potassium 3.5 - 5.1 mmol/L 3.3(L) 3.4(L) 3.8  Chloride 98 - 111 mmol/L 106 107 103  CO2 22 - 32 mmol/L _0 Calcium 8.9 - 10.3 mg/dL 8.4(L) 8.5(L) 8.9  Total Protein 6.5 - 8.1 g/dL - 5.6(L) 7.2  Total Bilirubin 0.3 - 1.2 mg/dL - 0.5 0.7  Alkaline Phos 38 - 126 U/L - 38 47  AST 15 - 41 U/L - 13(L) 26  ALT 0 - 44 U/L - 7 6    Hepatic Function Latest Ref Rng & Units 11/16/2020 11/14/2020 11/11/2020  Total Protein 6.5 - 8.1 g/dL 5.6(L) 7.2 6.5  Albumin 3.5 - 5.0 g/dL 3.1(L) 4.1 3.7  AST 15 - 41  U/L 13(L) 26 17  ALT 0 - 44 U/L _0 Alk Phosphatase 38 - 126 U/L 38 47 46  Total Bilirubin 0.3 - 1.2 mg/dL 0.5 0.7 0.5    Serum lipase 32 on 11/14/2020. Serum lipase has been normal previously as well.  Assessment:  #1.  Abdominal pain.  Patient remains with epigastric and right upper quadrant abdominal pain.  I suspect her pain may be due to gastric ulcer.  However she also has cholelithiasis and calcified cystic pancreatic lesion.  She is not tolerating soft diet.  Discussed with Dr. Arnoldo Morale and Dr. Darrick Meigs.  Diet changed to full liquids.  She may have to continue his diet until endoscopic ultrasound accomplished which is planned for 11/20/2020.  #2.  Anemia.  Hemoglobin has remained stable over the last 24 hours.  Prior drop may have  been due to hydration.  No evidence of overt GI bleed.  #3.  History of pulmonary embolism.  She was diagnosed with pulmonary embolism in September 2021.  She was on Xarelto.  Chest CTA from 1 week ago was negative.  She is on enoxaparin for DVT prophylaxis.  Recommendations  Diet changed to full liquids. Continue IV pantoprazole until she is ready to be discharged. Continue sucralfate.

## 2020-11-17 NOTE — Progress Notes (Signed)
  Subjective: Nausea and vomiting with soft diet.  Abdominal pain returned last night.  Doing better this morning with pain meds.  Objective: Vital Murphy in last 24 hours: Temp:  [98.1 F (36.7 C)-98.6 F (37 C)] 98.1 F (36.7 C) (03/27 0537) Pulse Rate:  [63-73] 63 (03/27 0537) Resp:  [18-20] 18 (03/27 0537) BP: (113-139)/(61-75) 113/61 (03/27 0537) SpO2:  [95 %-98 %] 95 % (03/27 0537) Last BM Date: 11/17/20  Intake/Output from previous day: 03/26 0701 - 03/27 0700 In: 978.2 [P.O.:960; IV Piggyback:18.2] Out: -  Intake/Output this shift: Total I/O In: 240 [P.O.:240] Out: -   General appearance: alert, cooperative and no distress GI: Soft, minimal tenderness in epigastric and right upper quadrant region.  Lab Results:  Recent Labs    11/16/20 0429 11/17/20 0600  WBC 3.4* 4.1  HGB 9.2* 9.1*  HCT 30.0* 29.0*  PLT 198 188   BMET Recent Labs    11/16/20 0429 11/17/20 0600  NA 141 138  K 3.4* 3.3*  CL 107 106  CO2 25 24  GLUCOSE 94 93  BUN 7* 10  CREATININE 0.61 0.73  CALCIUM 8.5* 8.4*   PT/INR Recent Labs    11/16/20 0429  LABPROT 13.3  INR 1.1    Studies/Results: No results found.  Anti-infectives: Anti-infectives (From admission, onward)   None      Assessment/Plan: Imp:  Chronic abdominal pain, multifactorial etiology. Plan:  Will go back to full liquid.  Awaiting EUS findings.  LOS: 2 days    Maria Murphy 11/17/2020

## 2020-11-17 NOTE — Progress Notes (Signed)
[facility]  PROGRESS NOTE  Maria Murphy IRS:[id number] DOB: May 28, 1956 DOA: 11/14/2020 PCP: Ladell Pier, MD   Brief HPI:   65 year old female with history of hypertension, peptic ulcer disease with perforation, saddle pulmonary embolus rivaroxaban until recently on 11/03/2020, depression presented with 1 day history of intermittent abdominal pain, nausea vomiting.  Pain located in right upper quadrant.  Associated with nausea and vomiting.  Denies any NSAID use. She was recently mated to the hospital from 11/01/2020 to 11/04/2018 for GI bleed.  At that time she was transferred [facility].  She was discharged with hemoglobin of 9.3.  At that time the decision was made to discontinue her therapeutic dose of Lovenox event as patient was initially diagnosed with PE on 05/01/2020 had endorsed good compliance with her oxygen until beginning of November 2021 after which she has been taking it faithfully.  Targeted intake for stopping anticoagulation was in April 2022 however given the patient's likely GI bleed because fact that she received 5 months of rivaroxaban the dose of rivaroxaban was cut down to 10 mg daily at the time of discharge.  However patient was not taking Xarelto due to some type of misunderstanding.   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  There was also prominent main pancreatic duct.  Inflamed appearance of distal stomach and proximal duodenum with adjacent fat stranding that was increased since 09/24/2020.  Mild inflamed appearance of gallbladder controlled hepatitis.  Notably, the patient has had a complicated history of peptic ulcer disease secondary to NSAID use. She had a prolonged hospitalization from 04/21/2020 to 05/17/2020 secondary to a perforated gastric ulcer. She underwent exploratory laparotomy with Phillip Heal patch placed on 04/22/2021. This was complicated by intra-abdominal  abscess for which required IR aspiration. That hospitalization was also complicated by a pulmonary embolus for which the patient was started on rivaroxaban.    CT scan of the abdomen/pelvis obtained during this admission cholelithiasis, large stool burden throughout colon.  Stable partially calcified cystic mass within the pancreatic head.  No other significant abnormalities.   Subjective   Patient seen and examined, continues to have right upper quadrant pain.   Assessment/Plan:     1. Right upper quadrant pain-patient has history of peptic ulcer disease, previously she had inflamed duodenum seen on CT scan 10/15/2020, however CT scan abdomen during this admission did not show any significant abnormality.  Patient is supposed to undergo endoscopic ultrasound on Wednesday next week, GI recommends to also do endoscopy at the same time.  In the meantime continue Protonix 40 mg IV every 12 hours, sucralfate 1 g 3 times daily and nightly.  Patient has been started on liquid diet.  Will observe for 1 more day as per GI and likely discharge home in a.m.  2. Hypertension-blood pressure stable, continue amlodipine.  3. Cholelithiasis-patient was seen by general surgery, no plan for surgical intervention.  Plan for outpatient follow-up with general surgery after endoscopic ultrasound next week.  4. 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  5. History of pulmonary embolism-patient had submassive/saddle embolus on CTA on 05/01/2020.  She was started on Xarelto but she is  currently not on Xarelto, as it was stopped on 3/132022 due to GI bleed.  She has completed 5 months of treatment.  6. Depression-continue Celexa, trazodone  7. Anemia-hemoglobin dropped to 9.2 this morning.  Will follow CBC in a.m.   8.  Hypokalemia-potassium is 3.3.  Will give IV KCl 10 mEq x 2.  Also check serum magnesium level.  Follow BMP in am.  Scheduled medications:   . amLODipine  2.5 mg Oral  Daily  . enoxaparin (LOVENOX) injection  40 mg Subcutaneous Q24H  . pantoprazole (PROTONIX) IV  40 mg Intravenous Q12H  . sucralfate  1 g Oral TID WC & HS         Data Reviewed:   CBG:  No results for input(s): GLUCAP in the last 168 hours.  SpO2: 97 %    Vitals:   11/16/20 1410 11/16/20 2102 11/17/20 0537 11/17/20 1304  BP: 131/75 139/65 113/61 124/76  Pulse: 67 73 63 62  Resp: 20 20 18 20   Temp: 98.2 F (36.8 C) 98.6 F (37 C) 98.1 F (36.7 C) 98.2 F (36.8 C)  TempSrc: Oral Oral Oral Oral  SpO2: 98% 97% 95% 97%  Weight:      Height:         Intake/Output Summary (Last 24 hours) at 11/17/2020 1347 Last data filed at 11/17/2020 1300 Gross per 24 hour  Intake 1338.16 ml  Output -  Net 1338.16 ml    03/25 1901 - 03/27 0700 In: 978.2 [P.O.:960] Out: -   Filed Weights   11/14/20 2241  Weight: 79.4 kg    CBC:  Recent Labs  Lab 11/11/20 0648 11/14/20 2329 11/16/20 0429 11/17/20 0600  WBC 8.1 9.2 3.4* 4.1  HGB 10.1* 10.4* 9.2* 9.1*  HCT 31.0* 32.1* 30.0* 29.0*  PLT 209 250 198 188  MCV 86.6 85.1 88.8 87.9  MCH 28.2 27.6 27.2 27.6  MCHC 32.6 32.4 30.7 31.4  RDW 15.6* 15.9* 16.0* 15.8*  LYMPHSABS 0.9 1.1  --   --   MONOABS 0.4 0.4  --   --   EOSABS 0.0 0.1  --   --   BASOSABS 0.0 0.0  --   --     Complete metabolic panel:  Recent Labs  Lab 11/11/20 0648 11/14/20 2329 11/16/20 0429 11/17/20 0600  NA 138 137 141 138  K 3.7 3.8 3.4* 3.3*  CL 106 103 107 106  CO2 24 22 25 24   GLUCOSE 139* 129* 94 93  BUN 7* 14 7* 10  CREATININE 0.64 0.69 0.61 0.73  CALCIUM 8.8* 8.9 8.5* 8.4*  AST 17 26 13*  --   ALT 8 6 7   --   ALKPHOS 46 47 38  --   BILITOT 0.5 0.7 0.5  --   ALBUMIN 3.7 4.1 3.1*  --   MG  --   --   --  2.0  INR  --   --  1.1  --     Recent Labs  Lab 11/11/20 0648 11/14/20 2329  LIPASE 39 32    Recent Labs  Lab 11/15/20 0455  SARSCOV2NAA NEGATIVE     ------------------------------------------------------------------------------------------------------------------ No results for input(s): CHOL, HDL, LDLCALC, TRIG, CHOLHDL, LDLDIRECT in the last 72 hours.  No results found for: HGBA1C ------------------------------------------------------------------------------------------------------------------ No results for input(s): TSH, T4TOTAL, T3FREE, THYROIDAB in the last 72 hours.  Invalid input(s): FREET3 ------------------------------------------------------------------------------------------------------------------ No results for input(s): VITAMINB12, FOLATE, FERRITIN, TIBC, IRON, RETICCTPCT in the last 72 hours.  Coagulation profile  Recent Labs  Lab 11/16/20 0429  INR 1.1    No results for input(s): DDIMER in the last 72 hours.  Cardiac Enzymes  No results for input(s): CKMB, TROPONINI, MYOGLOBIN in the last 168 hours.  Invalid input(s): CK ------------------------------------------------------------------------------------------------------------------ No results found  for: BNP   Antibiotics: Anti-infectives (From admission, onward)   None       Radiology Reports  No results found.    DVT prophylaxis: SCDs  Code Status: Full code  Family Communication: No family at bedside   Consultants:  General surgery  Gastroenterology  Procedures:      Objective    Physical Examination:    General-appears in no acute distress  Heart-S1-S2, regular, no murmur auscultated  Lungs-clear to auscultation bilaterally, no wheezing or crackles auscultated  Abdomen-soft, right upper quadrant tenderness to palpation, no rigidity or guarding.  Extremities-no edema in the lower extremities  Neuro-alert, oriented x3, no focal deficit noted   Status is: Inpatient  Dispo: The patient is from: Home              Anticipated d/c is to: Home              Anticipated d/c date is: 11/18/2020               Patient currently not stable for discharge  Barrier to discharge-ongoing right upper quadrant pain  COVID-19 Labs  No results for input(s): DDIMER, FERRITIN, LDH, CRP in the last 72 hours.  Lab Results  Component Value Date   [place] NEGATIVE 11/15/2020   [place] NEGATIVE 11/02/2020   [place] NEGATIVE 10/18/2020   [place] NEGATIVE 09/24/2020    Microbiology  Recent Results (from the past 240 hour(s))  SARS CORONAVIRUS 2 (TAT 6-24 HRS) Nasopharyngeal Nasopharyngeal Swab     Status: None   Collection Time: 11/15/20  4:55 AM   Specimen: Nasopharyngeal Swab  Result Value Ref Range Status   SARS Coronavirus 2 NEGATIVE NEGATIVE Final    Comment: (NOTE) SARS-CoV-2 target nucleic acids are NOT DETECTED.  The SARS-CoV-2 RNA is generally detectable in upper and lower respiratory specimens during the acute phase of infection. Negative results do not preclude SARS-CoV-2 infection, do not rule out co-infections with other pathogens, and should not be used as the sole basis for treatment or other patient management decisions. Negative results must be combined with clinical observations, patient history, and epidemiological information. The expected result is Negative.  Fact Sheet for Patients: SugarRoll.be  Fact Sheet for Healthcare Providers: https://www.woods-mathews.com/  This test is not yet approved or cleared by the [place] FDA and  has been authorized for detection and/or diagnosis of SARS-CoV-2 by FDA under an Emergency Use Authorization (EUA). This EUA will remain  in effect (meaning this test can be used) for the duration of the COVID-19 declaration under Se ction 564(b)(1) of the Act, 21 U.S.C. section 360bbb-3(b)(1), unless the authorization is terminated or revoked sooner.  Performed at [facility], [place] [address], [place], [place] [address]   Culture, Urine     Status: None (Preliminary result)    Collection Time: 11/15/20  3:00 PM   Specimen: Urine, Clean Catch  Result Value Ref Range Status   Specimen Description   Final    URINE, CLEAN CATCH Performed at [facility], [address] [place], [place] [address]    Special Requests   Final    NONE Performed at [facility], [address], [place], [place] [address]    Culture   Final    CULTURE REINCUBATED FOR BETTER GROWTH Performed at [facility], [place] [address] [place], [place] [address]    Report Status PENDING  Incomplete             Oswald Hillock  [facility] If 7PM-7AM, please contact night-coverage at [email or web address], Office  [phone]   11/17/2020, 1:47 PM  LOS: 2 days

## 2020-11-17 NOTE — Progress Notes (Signed)
Patient stated that she was told she was having an EGD tomorrow. I did not see an order but I told patient that we would remove anything to drink since she was certain that she was having this procedure done. I also asked patient to inform me tonight of any incidences of emesis tonight.

## 2020-11-18 ENCOUNTER — Telehealth: Payer: Self-pay | Admitting: Gastroenterology

## 2020-11-18 ENCOUNTER — Ambulatory Visit (HOSPITAL_COMMUNITY): Payer: Medicaid Other | Admitting: Physical Therapy

## 2020-11-18 DIAGNOSIS — G8929 Other chronic pain: Secondary | ICD-10-CM | POA: Insufficient documentation

## 2020-11-18 DIAGNOSIS — R52 Pain, unspecified: Secondary | ICD-10-CM

## 2020-11-18 LAB — BASIC METABOLIC PANEL
Anion gap: 10 (ref 5–15)
BUN: 8 mg/dL (ref 8–23)
CO2: 24 mmol/L (ref 22–32)
Calcium: 8.7 mg/dL — ABNORMAL LOW (ref 8.9–10.3)
Chloride: 105 mmol/L (ref 98–111)
Creatinine, Ser: 0.66 mg/dL (ref 0.44–1.00)
GFR, Estimated: >60 mL/min (ref >60)
Glucose, Bld: 91 mg/dL (ref 70–99)
Potassium: 3.3 mmol/L — ABNORMAL LOW (ref 3.5–5.1)
Sodium: 139 mmol/L (ref 135–145)

## 2020-11-18 NOTE — Progress Notes (Signed)
Nsg Discharge Note  Admit Date:  11/14/2020 Discharge date: 11/18/2020   Aspin Palomarez to be D/C' Home per MD order.  AVS completed.  Copy for chart, and copy for patient signed, and dated. Patient/caregiver able to verbalize understanding.  Discharge Medication: Allergies as of 11/18/2020      Reactions   Wheat Extract Swelling      Medication List    TAKE these medications   acetaminophen 500 MG tablet Commonly known as: TYLENOL Take 1 tablet (500 mg total) by mouth every 6 (six) hours as needed for mild pain or fever. What changed:   how much to take  when to take this   amLODipine 2.5 MG tablet Commonly known as: NORVASC Take 1 tablet (2.5 mg total) by mouth daily. For BP   citalopram 20 MG tablet Commonly known as: CELEXA Take 20 mg by mouth daily.   dicyclomine 10 MG capsule Commonly known as: BENTYL Take 10 mg by mouth 2 (two) times daily.   donepezil 10 MG tablet Commonly known as: ARICEPT Take 0.5-1 tablets (5-10 mg total) by mouth in the morning and at bedtime. What changed: how much to take   famotidine 20 MG tablet Commonly known as: Pepcid Take 1 tablet (20 mg total) by mouth 2 (two) times daily.   ferrous sulfate 325 (65 FE) MG tablet Take 1 tablet (325 mg total) by mouth daily with breakfast.   gabapentin 300 MG capsule Commonly known as: NEURONTIN Take 1 capsule (300 mg total) by mouth 2 (two) times daily.   lactose free nutrition Liqd Take 237 mLs by mouth 2 (two) times daily.   lipase/protease/amylase 36000 UNITS Cpep capsule Commonly known as: Creon Take 2 capsules (72,000 Units total) by mouth 3 (three) times daily with meals. May also take 1 capsule (36,000 Units total) as needed (with snacks).   mirtazapine 15 MG tablet Commonly known as: Remeron Take 1 tablet (15 mg total) by mouth at bedtime.   ondansetron 8 MG disintegrating tablet Commonly known as: Zofran ODT Take 1 tablet (8 mg total) by mouth every 8 (eight) hours as needed  for nausea or vomiting.   pantoprazole 40 MG tablet Commonly known as: PROTONIX Take 1 tablet (40 mg total) by mouth 2 (two) times daily.   promethazine 25 MG suppository Commonly known as: PHENERGAN Place 1 suppository (25 mg total) rectally every 6 (six) hours as needed for nausea or vomiting.   promethazine 25 MG tablet Commonly known as: PHENERGAN Take 1 tablet (25 mg total) by mouth every 6 (six) hours as needed for nausea or vomiting.   rivaroxaban 10 MG Tabs tablet Commonly known as: XARELTO Take 1 tablet (10 mg total) by mouth every evening.   sucralfate 1 GM/10ML suspension Commonly known as: CARAFATE Take 10 mLs (1 g total) by mouth 4 (four) times daily -  with meals and at bedtime.   traZODone 100 MG tablet Commonly known as: DESYREL Take 100 mg by mouth at bedtime.       Discharge Assessment: Vitals:   11/18/20 0527 11/18/20 1253  BP: 136/69 133/76  Pulse: (!) 54 (!) 59  Resp: 20 16  Temp: 97.9 F (36.6 C) 98.6 F (37 C)  SpO2: 97% 98%   Skin clean, dry and intact without evidence of skin break down, no evidence of skin tears noted. IV catheter discontinued intact. Site without signs and symptoms of complications - no redness or edema noted at insertion site, patient denies c/o pain - only slight  tenderness at site.  Dressing with slight pressure applied.  D/c Instructions-Education: Discharge instructions given to patient/family with verbalized understanding. D/c education completed with patient/family including follow up instructions, medication list, d/c activities limitations if indicated, with other d/c instructions as indicated by MD - patient able to verbalize understanding, all questions fully answered. Patient instructed to return to ED, call 911, or call MD for any changes in condition.  Patient escorted via [place], and D/C home via private auto.  Clovis Fredrickson, LPN 9/32/6712 4:58 PM

## 2020-11-18 NOTE — Discharge Summary (Signed)
Physician Discharge Summary  Faigy Hibben AOZ:[id number] DOB: Mar 21, 1956 DOA: 11/14/2020  PCP: Marcine Matar, MD  Admit date: 11/14/2020 Discharge date: 11/18/2020  Admitted From: Home Disposition:  Home   Recommendations for Outpatient Follow-up:  1. Follow up with PCP in 1-2 weeks 2. Please obtain BMP/CBC in one week   Discharge Condition: Stable CODE STATUS: FULL Diet recommendation: Heart Healthy    Brief/Interim Summary: 65 year old female with history of hypertension, peptic ulcer disease with perforation, saddle pulmonary embolus on rivaroxaban until recently on 11/03/20, depression presenting with 1-day history of intermittent abdominal pain and nausea and vomiting. She describes her abd pain as severe and sharp, primarily in RUQ and R-flank.  She had one episode of nausea and vomiting on the day of admission.  She denies any NSAID use.  She states that she took 3 Percocets before coming to the emergency department on 11/14/2020 without relief.  She has not had any fevers, chills, dysuria, hematuria, hematochezia, melena, diarrhea.  Her last bowel movement was on the morning of 11/14/2020.  She denies any frank chest pain, shortness breath, hemoptysis, coughing, headache, neck pain, rashes.  She denies any recent injury or trauma.  Patient has had numerous emergency department visits and hospital admissions.  Most recently, the patient was admitted to the hospital from 11/01/2020 to 11/03/2020 for another GI bleed.  The patient was transfused 1 unit PRBC.  She was discharged with a hemoglobin 9.3.  During that hospital admission, the decision was made to discontinue her therapeutic dosing of rivaroxaban.  Although the patient was initially diagnosed with her PE on 05/01/2020, the patient had endorsed poor compliance with her rivaroxaban until the beginning of November 2021 after which she had been taking it faithfully.  Therefore, the target date for stopping her rivaroxaban was  initially targeted around the end of April 2022.  However given the patient's recurrent GI bleeds and the fact that she had finished essentially 5 continuous months of rivaroxaban, the decision was made to discontinue therapeutic dosing and start the patient on rivaroxaban 10 mg daily at the time of her discharge on 11/03/2020. Unfortunately, as consistent with her previous history the patient had some type of misunderstanding and has not restarted her rivaroxaban in the interim since her discharge on 11/03/2020. Once again, the patient had a recent ED visit on 11/10/2020 with similar right flank pain.  CT of the abdomen and pelvis at that time was unremarkable, and the patient was discharged home in stable condition. The patient has had 2 recent emergency department visits. 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 There was also more prominent main pancreatic duct.There is also an inflamed appearance of the distal stomach and proximal duodenum with adjacent fat stranding that was increased since 09/24/2020. There is mild inflamed appearance of the gallbladder and porta hepatis. The patient was discharged home in stable condition after fluids and conservative care in the emergency department with a prescription for oxycodone.   Notably, the patient has had a complicated history of peptic ulcer disease secondary to NSAID use. She had a prolonged hospitalization from 04/21/2020 to 05/17/2020 secondary to a perforated gastric ulcer. She underwent exploratory laparotomy with Cheree Ditto patch placed on 04/22/2021. This was complicated by intra-abdominal abscess for which required IR aspiration. That hospitalization was also complicated by a pulmonary embolus for which the patient was started on rivaroxaban. She was subsequently  remitted to the hospital on 07/05/2020 to 07/12/2020 for intractable nausea and  vomiting. Fortunately, the patient improved with conservative/nonoperative management. She underwent EGD on 07/10/2020 which showed a scar in the gastric antrum and prepyloric region. There was nonbleeding duodenal ulcers. Once again, the patient was readmitted from 09/23/2020 to2/4/22 for abdominal pain and intractable nausea and vomiting. Again, the patient improved with conservative care.  The patient was placed on bowel rest and started on cathartics with some improvement in her abd pain.  Her diet was advanced, but she had some worsen abd pain.  She was downgraded to full liquids with she tolerated.  After speaking with GI and general surgery, they felt the patient patient was ok to go home with a full liquid diet.  Patient has EUS scheduled on 11/20/20 of which she was reminded the importance to keep the appointment.  Discharge Diagnoses:  Intractable Right abdominal pain/vomiting -This appears to be an exacerbation of her chronic upper and R-sided abd pain -Although she does not specifically endorse "chronic" Right side abd pain, review of her extensive medical records shows she has had this type and location of pain on several prior occasions -Suspect this is multifactorial secondary to her peptic ulcer disease, constipation and possible symptomatic cholelithiasis -Continue Protonix and carafate -10/18/20 EGD--nonbleeding gastric ulcers;large duodenal ulcer in the duodenal bulb, close to 35 mm in diameter;no presence of stigmata of recent bleeding but there was presence of a suture at the bed of the ulcer which was semicircumferential -Avoid NSAIDs -11/14/20 CT abd-multiple gallstones; stable pancreatic partially calcified cystic mass 4.2 cm; no ureteral stones or hydronephrosis; large stool burden -10/15/2020 CT abdomen--as discussed above -GI consult appreciated-->cleared patient for d/c on 3/28 with full liquids  Inflamed gallbladder appearance--noted on prior imaging -09/18/2020 HIDA  scan normal -10/18/20 RUQ US--Cholelithiasis and mild gallbladder wall thickening.+murphy's -Previously seen by surgery who did not feel that her clinical presentation was consistent with cholecystitis -RUQ [place] 11/15/20--Cholelithiasis and gallbladder sludge is noted without gallbladder wall thickening or pericholecystic fluid.  Pancreatic head mass -This has been followed by surveillance by GI -EUS upcoming 11/20/20 by Dr. Berna Spare -baseline Hgb 10-11 -Hgb now stable--9.1 on day of d/c  History of pulmonary embolus -Patient had submassive/saddle embolus on CTA on 05/01/2020 -Temporarily holding rivaroxaban -patient has been taking faithfully since beginning of Nov 2021 -plan 6 full months of tx-->prior target end of April but stopped on 11/03/20 as discussed above  Essential hypertension -Holding lisinopril HCTZ for now -Monitor BP -IV labetalol as neededSBP >180  Depression -Restarted Celexa and trazodone once able to tolerate p.o. -Restarted Remeron once able to tolerate p.o.   Discharge Instructions   Allergies as of 11/18/2020      Reactions   Wheat Extract Swelling      Medication List    TAKE these medications   acetaminophen 500 MG tablet Commonly known as: TYLENOL Take 1 tablet (500 mg total) by mouth every 6 (six) hours as needed for mild pain or fever. What changed:   how much to take  when to take this   amLODipine 2.5 MG tablet Commonly known as: NORVASC Take 1 tablet (2.5 mg total) by mouth daily. For BP   citalopram 20 MG tablet Commonly known as: CELEXA Take 20 mg by mouth daily.   dicyclomine 10 MG capsule Commonly known as: BENTYL Take 10 mg by mouth 2 (two) times daily.   donepezil 10 MG tablet Commonly known as: ARICEPT Take 0.5-1 tablets (5-10 mg  total) by mouth in the morning and at bedtime. What changed: how much to take   famotidine 20 MG tablet Commonly known as: Pepcid Take 1 tablet (20 mg total) by mouth 2 (two)  times daily.   ferrous sulfate 325 (65 FE) MG tablet Take 1 tablet (325 mg total) by mouth daily with breakfast.   gabapentin 300 MG capsule Commonly known as: NEURONTIN Take 1 capsule (300 mg total) by mouth 2 (two) times daily.   lactose free nutrition Liqd Take 237 mLs by mouth 2 (two) times daily.   lipase/protease/amylase 47829 UNITS Cpep capsule Commonly known as: Creon Take 2 capsules (72,000 Units total) by mouth 3 (three) times daily with meals. May also take 1 capsule (36,000 Units total) as needed (with snacks).   mirtazapine 15 MG tablet Commonly known as: Remeron Take 1 tablet (15 mg total) by mouth at bedtime.   ondansetron 8 MG disintegrating tablet Commonly known as: Zofran ODT Take 1 tablet (8 mg total) by mouth every 8 (eight) hours as needed for nausea or vomiting.   pantoprazole 40 MG tablet Commonly known as: PROTONIX Take 1 tablet (40 mg total) by mouth 2 (two) times daily.   promethazine 25 MG suppository Commonly known as: PHENERGAN Place 1 suppository (25 mg total) rectally every 6 (six) hours as needed for nausea or vomiting.   promethazine 25 MG tablet Commonly known as: PHENERGAN Take 1 tablet (25 mg total) by mouth every 6 (six) hours as needed for nausea or vomiting.   rivaroxaban 10 MG Tabs tablet Commonly known as: XARELTO Take 1 tablet (10 mg total) by mouth every evening.   sucralfate 1 GM/10ML suspension Commonly known as: CARAFATE Take 10 mLs (1 g total) by mouth 4 (four) times daily -  with meals and at bedtime.   traZODone 100 MG tablet Commonly known as: DESYREL Take 100 mg by mouth at bedtime.       Follow-up Information    Franky Macho, MD. Schedule an appointment as soon as possible for a visit on 11/26/2020.   Specialty: General Surgery Contact information: [id number] [place] [address] [phone]              Allergies  Allergen Reactions  . Wheat Extract Swelling     Consultations:  GI  General surgery   Procedures/Studies: CT Angio Chest PE W and/or Wo Contrast  Result Date: 11/10/2020 CLINICAL DATA:  65 year old female with right rib and flank pain. History of PE in September 2021. Abdominal pain with black tarry stools. History of cholelithiasis. History of exploratory laparotomy, perforated gastric ulcer in August 2021. EXAM: CT ANGIOGRAPHY CHEST CT ABDOMEN AND PELVIS WITH CONTRAST TECHNIQUE: Multidetector CT imaging of the chest was performed using the standard protocol during bolus administration of intravenous contrast. Multiplanar CT image reconstructions and MIPs were obtained to evaluate the vascular anatomy. Multidetector CT imaging of the abdomen and pelvis was performed using the standard protocol during bolus administration of intravenous contrast. CONTRAST:  OMNIPAQUE IOHEXOL 350 MG/ML SOLN COMPARISON:  CT Abdomen and Pelvis 10/15/2020 and earlier. CTA chest 05/01/2020. FINDINGS: CTA CHEST FINDINGS Cardiovascular: Good contrast bolus timing in the pulmonary arterial tree. No focal filling defect identified in the pulmonary arteries today to suggest acute or residual pulmonary embolism. Minimal calcified coronary artery atherosclerosis is evident. Mild cardiomegaly. No pericardial effusion. Minimal plaque of the thoracic aorta. Mediastinum/Nodes: Negative.  No lymphadenopathy. Lungs/Pleura: Somewhat lower lung volumes compared to last year. Resolved pleural effusions and lower lobe  collapse or consolidation. Mild mosaic attenuation and mild peribronchial streaky opacity in the lingula, favor a combination of atelectasis and mild gas trapping. Major airways are patent. No areas suspicious for acute respiratory infection. Musculoskeletal: Chronic severe T10-T11 disc and endplate degeneration. Chronic severe degeneration at the visible shoulders. No acute osseous abnormality identified. Review of the MIP images confirms the above findings. CT  ABDOMEN and PELVIS FINDINGS Hepatobiliary: Chronic gallstones individually up to 16 mm. Continued indistinct appearance of the porta hepatis which appears mildly inflamed. But at the gallbladder fundus no wall thickening or pericholecystic inflammation is evident. No hyperenhancement at the gallbladder fossa. Stable liver parenchyma including tiny benign appearing low-density area at the dome. No bile duct enlargement. Pancreas: Abnormal partially calcified multi cystic appearing pancreatic head mass as described on the CT last month appears stable measuring 4.3 cm on series 4, image 37. No main pancreatic ductal dilatation. No superimposed acute pancreatitis suspected. However, along with the indistinct porta hepatis the gastric antrum and proximal duodenum continue to appear indistinct and inflamed. See coronal image 35 today. Compared to last month, the distal stomach and proximal duodenum appear mildly improved. No extraluminal gas or fluid identified. Proximal stomach and distal duodenum appear within normal limits. Spleen: Negative. Adrenals/Urinary Tract: Adrenal glands remain normal. Stable kidneys with bilateral parapelvic and exophytic renal cystic lesions which appeared benign by MRI in August of last year. Symmetric renal contrast excretion with no hydronephrosis or hydroureter. Diminutive and unremarkable urinary bladder. Stable pelvic phleboliths. Stomach/Bowel: Sigmoid diverticulosis again noted. No active inflammation identified. Otherwise redundant large bowel. Retained stool in the right colon and at the hepatic flexure. Normal appendix visible on series 4, image 60. Decompressed and negative terminal ileum. No dilated small bowel. Stomach and duodenum described above. No free air. No free fluid. Vascular/Lymphatic: Major arterial structures remain patent with tortuosity but minimal atherosclerosis. Portal venous system is patent. No lymphadenopathy. Reproductive: Stable lobulated fibroid uterus.  Other: No pelvic free fluid. Musculoskeletal: Stable advanced lumbar disc and facet degeneration. No acute osseous abnormality identified. Review of the MIP images confirms the above findings. IMPRESSION: CHEST: 1. Negative for acute or residual pulmonary embolus. 2. Mild scattered pulmonary atelectasis and gas trapping. No other acute process in the chest. ABDOMEN AND PELVIS: 1. Largely stable CT appearance of the abdomen and pelvis since 10/15/2020 (please see also that report): 2. Stable cystic and partially calcified pancreatic head mass, 4.3 cm. 3. Slightly improved appearance of the gastric antrum, duodenal bulb, and porta hepatis from last month. As before this may reflect improving peptic ulcer disease. Underlying chronic cholelithiasis, without strong evidence of acute cholecystitis today. 4. Fibroid uterus. Multiple renal cysts which had a benign MRI appearance last yea. Sigmoid diverticulosis. Electronically Signed   By: Odessa Fleming M.D.   On: 11/10/2020 06:10   CT ABDOMEN PELVIS W CONTRAST  Result Date: 11/10/2020 CLINICAL DATA:  65 year old female with right rib and flank pain. History of PE in September 2021. Abdominal pain with black tarry stools. History of cholelithiasis. History of exploratory laparotomy, perforated gastric ulcer in August 2021. EXAM: CT ANGIOGRAPHY CHEST CT ABDOMEN AND PELVIS WITH CONTRAST TECHNIQUE: Multidetector CT imaging of the chest was performed using the standard protocol during bolus administration of intravenous contrast. Multiplanar CT image reconstructions and MIPs were obtained to evaluate the vascular anatomy. Multidetector CT imaging of the abdomen and pelvis was performed using the standard protocol during bolus administration of intravenous contrast. CONTRAST:  OMNIPAQUE IOHEXOL 350 MG/ML SOLN COMPARISON:  CT Abdomen and Pelvis 10/15/2020 and earlier. CTA chest 05/01/2020. FINDINGS: CTA CHEST FINDINGS Cardiovascular: Good contrast bolus timing in the  pulmonary arterial tree. No focal filling defect identified in the pulmonary arteries today to suggest acute or residual pulmonary embolism. Minimal calcified coronary artery atherosclerosis is evident. Mild cardiomegaly. No pericardial effusion. Minimal plaque of the thoracic aorta. Mediastinum/Nodes: Negative.  No lymphadenopathy. Lungs/Pleura: Somewhat lower lung volumes compared to last year. Resolved pleural effusions and lower lobe collapse or consolidation. Mild mosaic attenuation and mild peribronchial streaky opacity in the lingula, favor a combination of atelectasis and mild gas trapping. Major airways are patent. No areas suspicious for acute respiratory infection. Musculoskeletal: Chronic severe T10-T11 disc and endplate degeneration. Chronic severe degeneration at the visible shoulders. No acute osseous abnormality identified. Review of the MIP images confirms the above findings. CT ABDOMEN and PELVIS FINDINGS Hepatobiliary: Chronic gallstones individually up to 16 mm. Continued indistinct appearance of the porta hepatis which appears mildly inflamed. But at the gallbladder fundus no wall thickening or pericholecystic inflammation is evident. No hyperenhancement at the gallbladder fossa. Stable liver parenchyma including tiny benign appearing low-density area at the dome. No bile duct enlargement. Pancreas: Abnormal partially calcified multi cystic appearing pancreatic head mass as described on the CT last month appears stable measuring 4.3 cm on series 4, image 37. No main pancreatic ductal dilatation. No superimposed acute pancreatitis suspected. However, along with the indistinct porta hepatis the gastric antrum and proximal duodenum continue to appear indistinct and inflamed. See coronal image 35 today. Compared to last month, the distal stomach and proximal duodenum appear mildly improved. No extraluminal gas or fluid identified. Proximal stomach and distal duodenum appear within normal limits.  Spleen: Negative. Adrenals/Urinary Tract: Adrenal glands remain normal. Stable kidneys with bilateral parapelvic and exophytic renal cystic lesions which appeared benign by MRI in August of last year. Symmetric renal contrast excretion with no hydronephrosis or hydroureter. Diminutive and unremarkable urinary bladder. Stable pelvic phleboliths. Stomach/Bowel: Sigmoid diverticulosis again noted. No active inflammation identified. Otherwise redundant large bowel. Retained stool in the right colon and at the hepatic flexure. Normal appendix visible on series 4, image 60. Decompressed and negative terminal ileum. No dilated small bowel. Stomach and duodenum described above. No free air. No free fluid. Vascular/Lymphatic: Major arterial structures remain patent with tortuosity but minimal atherosclerosis. Portal venous system is patent. No lymphadenopathy. Reproductive: Stable lobulated fibroid uterus. Other: No pelvic free fluid. Musculoskeletal: Stable advanced lumbar disc and facet degeneration. No acute osseous abnormality identified. Review of the MIP images confirms the above findings. IMPRESSION: CHEST: 1. Negative for acute or residual pulmonary embolus. 2. Mild scattered pulmonary atelectasis and gas trapping. No other acute process in the chest. ABDOMEN AND PELVIS: 1. Largely stable CT appearance of the abdomen and pelvis since 10/15/2020 (please see also that report): 2. Stable cystic and partially calcified pancreatic head mass, 4.3 cm. 3. Slightly improved appearance of the gastric antrum, duodenal bulb, and porta hepatis from last month. As before this may reflect improving peptic ulcer disease. Underlying chronic cholelithiasis, without strong evidence of acute cholecystitis today. 4. Fibroid uterus. Multiple renal cysts which had a benign MRI appearance last yea. Sigmoid diverticulosis. Electronically Signed   By: Odessa Fleming M.D.   On: 11/10/2020 06:10   US Abdomen Limited  Result Date:  11/15/2020 CLINICAL DATA:  Right upper quadrant abdominal pain. EXAM: ULTRASOUND ABDOMEN LIMITED RIGHT UPPER QUADRANT COMPARISON:  November 14, 2020.  October 18, 2020. FINDINGS: Gallbladder: Cholelithiasis is noted as  well as some degree of sludge within the gallbladder lumen. No gallbladder wall thickening or pericholecystic fluid is noted. The absence or presence of sonographic Murphy's sign was not reported by the technologist. Common bile duct: Diameter: 2 mm which is within normal limits. Liver: No focal lesion identified. Within normal limits in parenchymal echogenicity. Portal vein is patent on color Doppler imaging with normal direction of blood flow towards the liver. Other: None. IMPRESSION: Cholelithiasis and gallbladder sludge is noted without gallbladder wall thickening or pericholecystic fluid. Electronically Signed   By: Lupita Raider M.D.   On: 11/15/2020 09:00   DG ABD ACUTE 2+V W 1V CHEST  Result Date: 11/11/2020 CLINICAL DATA:  65 year old female with persistent abdominal pain and vomiting. EXAM: DG ABDOMEN ACUTE WITH 1 VIEW CHEST COMPARISON:  CTA chest and CT Abdomen and Pelvis 11/10/2020, and earlier. FINDINGS: Portable AP upright view of the chest at 0724 hours. Lower lung volumes. No acute pulmonary opacity. Stable cardiac size and mediastinal contours. No pneumoperitoneum. Portable upright and supine views of the abdomen 0726 hours. Non obstructed bowel gas pattern. Cholelithiasis again noted. Pelvic vascular calcifications. No acute osseous abnormality identified. IMPRESSION: 1. Normal bowel gas pattern, no free air. Cholelithiasis again noted. 2. Lower lung volumes with no acute cardiopulmonary abnormality. Electronically Signed   By: Odessa Fleming M.D.   On: 11/11/2020 07:54   DG Abdomen Acute W/Chest  Result Date: 11/01/2020 CLINICAL DATA:  Abdominal pain with black, tarry stools. EXAM: DG ABDOMEN ACUTE WITH 1 VIEW CHEST COMPARISON:  October 18, 2020 FINDINGS: There is no evidence of  dilated bowel loops or free intraperitoneal air. No radiopaque calculi are seen. A stable 1.1 cm soft tissue calcification is seen overlying the medial aspect of the mid to upper right abdomen. Heart size and mediastinal contours are within normal limits. Chronic appearing increased lung markings are seen without evidence of acute infiltrate. IMPRESSION: 1. No evidence of bowel obstruction or acute cardiopulmonary disease. 2. Stable soft tissue calcification within the abdomen which corresponds to the partially calcified pancreatic mass seen on prior studies. Electronically Signed   By: Aram Candela M.D.   On: 11/01/2020 23:55   MM DIAG BREAST TOMO BILATERAL  Result Date: 11/12/2020 CLINICAL DATA:  65 year old female presenting for annual bilateral mammogram and 1 year follow-up of probably benign right breast calcifications. EXAM: DIGITAL DIAGNOSTIC BILATERAL MAMMOGRAM WITH TOMOSYNTHESIS AND CAD TECHNIQUE: Bilateral digital diagnostic mammography and breast tomosynthesis was performed. The images were evaluated with computer-aided detection. COMPARISON:  Previous exam(s). ACR Breast Density Category b: There are scattered areas of fibroglandular density. FINDINGS: A 9 mm group of coarse calcifications in the upper-outer right breast are mammographically stable. No new or suspicious mammographic findings are identified in the remainder of either breast. IMPRESSION: 1. Stable, probably benign right breast calcifications. Recommendation is for a final mammographic follow-up in 1 year. 2. No mammographic evidence of malignancy on the left. RECOMMENDATION: Bilateral diagnostic mammogram in 1 year. I have discussed the findings and recommendations with the patient. If applicable, a reminder letter will be sent to the patient regarding the next appointment. BI-RADS CATEGORY  3: Probably benign. Electronically Signed   By: Sande Brothers M.D.   On: 11/12/2020 14:23   CT Renal Stone Study  Result Date:  11/14/2020 CLINICAL DATA:  Right flank pain EXAM: CT ABDOMEN AND PELVIS WITHOUT CONTRAST TECHNIQUE: Multidetector CT imaging of the abdomen and pelvis was performed following the standard protocol without IV contrast. COMPARISON:  11/10/2020 FINDINGS: Lower  chest: Scarring in the lingula. Small hiatal hernia. No effusions. Hepatobiliary: Multiple gallstones within the gallbladder measuring up 2 17 mm. Small hypodensity in the right hepatic dome compatible with cyst. Pancreas: Partially calcified cystic mass again seen within the pancreatic head measures 4.2 cm, stable. No pancreatic ductal dilatation. Spleen: No focal abnormality.  Normal size. Adrenals/Urinary Tract: Multiple cystic lesions within the kidneys bilaterally. No hydronephrosis. Adrenal glands and urinary bladder unremarkable. Stomach/Bowel: Large stool burden throughout the colon. Normal appendix. Stomach is decompressed. Mildly prominent fluid-filled right abdominal small bowel loops. Terminal ileum is decompressed. Vascular/Lymphatic: No evidence of aneurysm or adenopathy. Reproductive: Fibroid uterus.  No adnexal mass. Other: No free fluid or free air. Musculoskeletal: No acute bony abnormality. IMPRESSION: Cholelithiasis. Stable partially calcified cystic mass within the pancreatic head. Stable bilateral renal cystic lesions. No renal or ureteral stones. No hydronephrosis. Large stool burden throughout the colon. Fibroid uterus. Electronically Signed   By: Charlett Nose M.D.   On: 11/14/2020 23:55        Discharge Exam: Vitals:   11/17/20 2104 11/18/20 0527  BP: (!) 142/96 136/69  Pulse: (!) 57 (!) 54  Resp: 20 20  Temp: 99.1 F (37.3 C) 97.9 F (36.6 C)  SpO2: 99% 97%   Vitals:   11/17/20 0537 11/17/20 1304 11/17/20 2104 11/18/20 0527  BP: 113/61 124/76 (!) 142/96 136/69  Pulse: 63 62 (!) 57 (!) 54  Resp: 18 20 20 20   Temp: 98.1 F (36.7 C) 98.2 F (36.8 C) 99.1 F (37.3 C) 97.9 F (36.6 C)  TempSrc: Oral Oral Oral Oral   SpO2: 95% 97% 99% 97%  Weight:      Height:        General: Pt is alert, awake, not in acute distress Cardiovascular: RRR, S1/S2 +, no rubs, no gallops Respiratory: CTA, no wheeze Abdominal: Soft, epigastric, periumbilical pain, ND, bowel sounds + Extremities: no edema, no cyanosis   The results of significant diagnostics from this hospitalization (including imaging, microbiology, ancillary and laboratory) are listed below for reference.    Significant Diagnostic Studies: CT Angio Chest PE W and/or Wo Contrast  Result Date: 11/10/2020 CLINICAL DATA:  65 year old female with right rib and flank pain. History of PE in September 2021. Abdominal pain with black tarry stools. History of cholelithiasis. History of exploratory laparotomy, perforated gastric ulcer in August 2021. EXAM: CT ANGIOGRAPHY CHEST CT ABDOMEN AND PELVIS WITH CONTRAST TECHNIQUE: Multidetector CT imaging of the chest was performed using the standard protocol during bolus administration of intravenous contrast. Multiplanar CT image reconstructions and MIPs were obtained to evaluate the vascular anatomy. Multidetector CT imaging of the abdomen and pelvis was performed using the standard protocol during bolus administration of intravenous contrast. CONTRAST:  OMNIPAQUE IOHEXOL 350 MG/ML SOLN COMPARISON:  CT Abdomen and Pelvis 10/15/2020 and earlier. CTA chest 05/01/2020. FINDINGS: CTA CHEST FINDINGS Cardiovascular: Good contrast bolus timing in the pulmonary arterial tree. No focal filling defect identified in the pulmonary arteries today to suggest acute or residual pulmonary embolism. Minimal calcified coronary artery atherosclerosis is evident. Mild cardiomegaly. No pericardial effusion. Minimal plaque of the thoracic aorta. Mediastinum/Nodes: Negative.  No lymphadenopathy. Lungs/Pleura: Somewhat lower lung volumes compared to last year. Resolved pleural effusions and lower lobe collapse or consolidation. Mild mosaic  attenuation and mild peribronchial streaky opacity in the lingula, favor a combination of atelectasis and mild gas trapping. Major airways are patent. No areas suspicious for acute respiratory infection. Musculoskeletal: Chronic severe T10-T11 disc and endplate degeneration. Chronic severe  degeneration at the visible shoulders. No acute osseous abnormality identified. Review of the MIP images confirms the above findings. CT ABDOMEN and PELVIS FINDINGS Hepatobiliary: Chronic gallstones individually up to 16 mm. Continued indistinct appearance of the porta hepatis which appears mildly inflamed. But at the gallbladder fundus no wall thickening or pericholecystic inflammation is evident. No hyperenhancement at the gallbladder fossa. Stable liver parenchyma including tiny benign appearing low-density area at the dome. No bile duct enlargement. Pancreas: Abnormal partially calcified multi cystic appearing pancreatic head mass as described on the CT last month appears stable measuring 4.3 cm on series 4, image 37. No main pancreatic ductal dilatation. No superimposed acute pancreatitis suspected. However, along with the indistinct porta hepatis the gastric antrum and proximal duodenum continue to appear indistinct and inflamed. See coronal image 35 today. Compared to last month, the distal stomach and proximal duodenum appear mildly improved. No extraluminal gas or fluid identified. Proximal stomach and distal duodenum appear within normal limits. Spleen: Negative. Adrenals/Urinary Tract: Adrenal glands remain normal. Stable kidneys with bilateral parapelvic and exophytic renal cystic lesions which appeared benign by MRI in August of last year. Symmetric renal contrast excretion with no hydronephrosis or hydroureter. Diminutive and unremarkable urinary bladder. Stable pelvic phleboliths. Stomach/Bowel: Sigmoid diverticulosis again noted. No active inflammation identified. Otherwise redundant large bowel. Retained stool in  the right colon and at the hepatic flexure. Normal appendix visible on series 4, image 60. Decompressed and negative terminal ileum. No dilated small bowel. Stomach and duodenum described above. No free air. No free fluid. Vascular/Lymphatic: Major arterial structures remain patent with tortuosity but minimal atherosclerosis. Portal venous system is patent. No lymphadenopathy. Reproductive: Stable lobulated fibroid uterus. Other: No pelvic free fluid. Musculoskeletal: Stable advanced lumbar disc and facet degeneration. No acute osseous abnormality identified. Review of the MIP images confirms the above findings. IMPRESSION: CHEST: 1. Negative for acute or residual pulmonary embolus. 2. Mild scattered pulmonary atelectasis and gas trapping. No other acute process in the chest. ABDOMEN AND PELVIS: 1. Largely stable CT appearance of the abdomen and pelvis since 10/15/2020 (please see also that report): 2. Stable cystic and partially calcified pancreatic head mass, 4.3 cm. 3. Slightly improved appearance of the gastric antrum, duodenal bulb, and porta hepatis from last month. As before this may reflect improving peptic ulcer disease. Underlying chronic cholelithiasis, without strong evidence of acute cholecystitis today. 4. Fibroid uterus. Multiple renal cysts which had a benign MRI appearance last yea. Sigmoid diverticulosis. Electronically Signed   By: Odessa Fleming M.D.   On: 11/10/2020 06:10   CT ABDOMEN PELVIS W CONTRAST  Result Date: 11/10/2020 CLINICAL DATA:  65 year old female with right rib and flank pain. History of PE in September 2021. Abdominal pain with black tarry stools. History of cholelithiasis. History of exploratory laparotomy, perforated gastric ulcer in August 2021. EXAM: CT ANGIOGRAPHY CHEST CT ABDOMEN AND PELVIS WITH CONTRAST TECHNIQUE: Multidetector CT imaging of the chest was performed using the standard protocol during bolus administration of intravenous contrast. Multiplanar CT image  reconstructions and MIPs were obtained to evaluate the vascular anatomy. Multidetector CT imaging of the abdomen and pelvis was performed using the standard protocol during bolus administration of intravenous contrast. CONTRAST:  OMNIPAQUE IOHEXOL 350 MG/ML SOLN COMPARISON:  CT Abdomen and Pelvis 10/15/2020 and earlier. CTA chest 05/01/2020. FINDINGS: CTA CHEST FINDINGS Cardiovascular: Good contrast bolus timing in the pulmonary arterial tree. No focal filling defect identified in the pulmonary arteries today to suggest acute or residual pulmonary embolism. Minimal calcified coronary  artery atherosclerosis is evident. Mild cardiomegaly. No pericardial effusion. Minimal plaque of the thoracic aorta. Mediastinum/Nodes: Negative.  No lymphadenopathy. Lungs/Pleura: Somewhat lower lung volumes compared to last year. Resolved pleural effusions and lower lobe collapse or consolidation. Mild mosaic attenuation and mild peribronchial streaky opacity in the lingula, favor a combination of atelectasis and mild gas trapping. Major airways are patent. No areas suspicious for acute respiratory infection. Musculoskeletal: Chronic severe T10-T11 disc and endplate degeneration. Chronic severe degeneration at the visible shoulders. No acute osseous abnormality identified. Review of the MIP images confirms the above findings. CT ABDOMEN and PELVIS FINDINGS Hepatobiliary: Chronic gallstones individually up to 16 mm. Continued indistinct appearance of the porta hepatis which appears mildly inflamed. But at the gallbladder fundus no wall thickening or pericholecystic inflammation is evident. No hyperenhancement at the gallbladder fossa. Stable liver parenchyma including tiny benign appearing low-density area at the dome. No bile duct enlargement. Pancreas: Abnormal partially calcified multi cystic appearing pancreatic head mass as described on the CT last month appears stable measuring 4.3 cm on series 4, image 37. No main  pancreatic ductal dilatation. No superimposed acute pancreatitis suspected. However, along with the indistinct porta hepatis the gastric antrum and proximal duodenum continue to appear indistinct and inflamed. See coronal image 35 today. Compared to last month, the distal stomach and proximal duodenum appear mildly improved. No extraluminal gas or fluid identified. Proximal stomach and distal duodenum appear within normal limits. Spleen: Negative. Adrenals/Urinary Tract: Adrenal glands remain normal. Stable kidneys with bilateral parapelvic and exophytic renal cystic lesions which appeared benign by MRI in August of last year. Symmetric renal contrast excretion with no hydronephrosis or hydroureter. Diminutive and unremarkable urinary bladder. Stable pelvic phleboliths. Stomach/Bowel: Sigmoid diverticulosis again noted. No active inflammation identified. Otherwise redundant large bowel. Retained stool in the right colon and at the hepatic flexure. Normal appendix visible on series 4, image 60. Decompressed and negative terminal ileum. No dilated small bowel. Stomach and duodenum described above. No free air. No free fluid. Vascular/Lymphatic: Major arterial structures remain patent with tortuosity but minimal atherosclerosis. Portal venous system is patent. No lymphadenopathy. Reproductive: Stable lobulated fibroid uterus. Other: No pelvic free fluid. Musculoskeletal: Stable advanced lumbar disc and facet degeneration. No acute osseous abnormality identified. Review of the MIP images confirms the above findings. IMPRESSION: CHEST: 1. Negative for acute or residual pulmonary embolus. 2. Mild scattered pulmonary atelectasis and gas trapping. No other acute process in the chest. ABDOMEN AND PELVIS: 1. Largely stable CT appearance of the abdomen and pelvis since 10/15/2020 (please see also that report): 2. Stable cystic and partially calcified pancreatic head mass, 4.3 cm. 3. Slightly improved appearance of the gastric  antrum, duodenal bulb, and porta hepatis from last month. As before this may reflect improving peptic ulcer disease. Underlying chronic cholelithiasis, without strong evidence of acute cholecystitis today. 4. Fibroid uterus. Multiple renal cysts which had a benign MRI appearance last yea. Sigmoid diverticulosis. Electronically Signed   By: Odessa Fleming M.D.   On: 11/10/2020 06:10   US Abdomen Limited  Result Date: 11/15/2020 CLINICAL DATA:  Right upper quadrant abdominal pain. EXAM: ULTRASOUND ABDOMEN LIMITED RIGHT UPPER QUADRANT COMPARISON:  November 14, 2020.  October 18, 2020. FINDINGS: Gallbladder: Cholelithiasis is noted as well as some degree of sludge within the gallbladder lumen. No gallbladder wall thickening or pericholecystic fluid is noted. The absence or presence of sonographic Murphy's sign was not reported by the technologist. Common bile duct: Diameter: 2 mm which is within normal limits. Liver:  No focal lesion identified. Within normal limits in parenchymal echogenicity. Portal vein is patent on color Doppler imaging with normal direction of blood flow towards the liver. Other: None. IMPRESSION: Cholelithiasis and gallbladder sludge is noted without gallbladder wall thickening or pericholecystic fluid. Electronically Signed   By: Lupita Raider M.D.   On: 11/15/2020 09:00   DG ABD ACUTE 2+V W 1V CHEST  Result Date: 11/11/2020 CLINICAL DATA:  65 year old female with persistent abdominal pain and vomiting. EXAM: DG ABDOMEN ACUTE WITH 1 VIEW CHEST COMPARISON:  CTA chest and CT Abdomen and Pelvis 11/10/2020, and earlier. FINDINGS: Portable AP upright view of the chest at 0724 hours. Lower lung volumes. No acute pulmonary opacity. Stable cardiac size and mediastinal contours. No pneumoperitoneum. Portable upright and supine views of the abdomen 0726 hours. Non obstructed bowel gas pattern. Cholelithiasis again noted. Pelvic vascular calcifications. No acute osseous abnormality identified. IMPRESSION:  1. Normal bowel gas pattern, no free air. Cholelithiasis again noted. 2. Lower lung volumes with no acute cardiopulmonary abnormality. Electronically Signed   By: Odessa Fleming M.D.   On: 11/11/2020 07:54   DG Abdomen Acute W/Chest  Result Date: 11/01/2020 CLINICAL DATA:  Abdominal pain with black, tarry stools. EXAM: DG ABDOMEN ACUTE WITH 1 VIEW CHEST COMPARISON:  October 18, 2020 FINDINGS: There is no evidence of dilated bowel loops or free intraperitoneal air. No radiopaque calculi are seen. A stable 1.1 cm soft tissue calcification is seen overlying the medial aspect of the mid to upper right abdomen. Heart size and mediastinal contours are within normal limits. Chronic appearing increased lung markings are seen without evidence of acute infiltrate. IMPRESSION: 1. No evidence of bowel obstruction or acute cardiopulmonary disease. 2. Stable soft tissue calcification within the abdomen which corresponds to the partially calcified pancreatic mass seen on prior studies. Electronically Signed   By: Aram Candela M.D.   On: 11/01/2020 23:55   MM DIAG BREAST TOMO BILATERAL  Result Date: 11/12/2020 CLINICAL DATA:  65 year old female presenting for annual bilateral mammogram and 1 year follow-up of probably benign right breast calcifications. EXAM: DIGITAL DIAGNOSTIC BILATERAL MAMMOGRAM WITH TOMOSYNTHESIS AND CAD TECHNIQUE: Bilateral digital diagnostic mammography and breast tomosynthesis was performed. The images were evaluated with computer-aided detection. COMPARISON:  Previous exam(s). ACR Breast Density Category b: There are scattered areas of fibroglandular density. FINDINGS: A 9 mm group of coarse calcifications in the upper-outer right breast are mammographically stable. No new or suspicious mammographic findings are identified in the remainder of either breast. IMPRESSION: 1. Stable, probably benign right breast calcifications. Recommendation is for a final mammographic follow-up in 1 year. 2. No  mammographic evidence of malignancy on the left. RECOMMENDATION: Bilateral diagnostic mammogram in 1 year. I have discussed the findings and recommendations with the patient. If applicable, a reminder letter will be sent to the patient regarding the next appointment. BI-RADS CATEGORY  3: Probably benign. Electronically Signed   By: Sande Brothers M.D.   On: 11/12/2020 14:23   CT Renal Stone Study  Result Date: 11/14/2020 CLINICAL DATA:  Right flank pain EXAM: CT ABDOMEN AND PELVIS WITHOUT CONTRAST TECHNIQUE: Multidetector CT imaging of the abdomen and pelvis was performed following the standard protocol without IV contrast. COMPARISON:  11/10/2020 FINDINGS: Lower chest: Scarring in the lingula. Small hiatal hernia. No effusions. Hepatobiliary: Multiple gallstones within the gallbladder measuring up 2 17 mm. Small hypodensity in the right hepatic dome compatible with cyst. Pancreas: Partially calcified cystic mass again seen within the pancreatic head measures 4.2 cm,  stable. No pancreatic ductal dilatation. Spleen: No focal abnormality.  Normal size. Adrenals/Urinary Tract: Multiple cystic lesions within the kidneys bilaterally. No hydronephrosis. Adrenal glands and urinary bladder unremarkable. Stomach/Bowel: Large stool burden throughout the colon. Normal appendix. Stomach is decompressed. Mildly prominent fluid-filled right abdominal small bowel loops. Terminal ileum is decompressed. Vascular/Lymphatic: No evidence of aneurysm or adenopathy. Reproductive: Fibroid uterus.  No adnexal mass. Other: No free fluid or free air. Musculoskeletal: No acute bony abnormality. IMPRESSION: Cholelithiasis. Stable partially calcified cystic mass within the pancreatic head. Stable bilateral renal cystic lesions. No renal or ureteral stones. No hydronephrosis. Large stool burden throughout the colon. Fibroid uterus. Electronically Signed   By: Charlett Nose M.D.   On: 11/14/2020 23:55     Microbiology: Recent Results  (from the past 240 hour(s))  SARS CORONAVIRUS 2 (Jakyron Fabro 6-24 HRS) Nasopharyngeal Nasopharyngeal Swab     Status: None   Collection Time: 11/15/20  4:55 AM   Specimen: Nasopharyngeal Swab  Result Value Ref Range Status   SARS Coronavirus 2 NEGATIVE NEGATIVE Final    Comment: (NOTE) SARS-CoV-2 target nucleic acids are NOT DETECTED.  The SARS-CoV-2 RNA is generally detectable in upper and lower respiratory specimens during the acute phase of infection. Negative results do not preclude SARS-CoV-2 infection, do not rule out co-infections with other pathogens, and should not be used as the sole basis for treatment or other patient management decisions. Negative results must be combined with clinical observations, patient history, and epidemiological information. The expected result is Negative.  Fact Sheet for Patients: HairSlick.no  Fact Sheet for Healthcare Providers: quierodirigir.com  This test is not yet approved or cleared by the [place] FDA and  has been authorized for detection and/or diagnosis of SARS-CoV-2 by FDA under an Emergency Use Authorization (EUA). This EUA will remain  in effect (meaning this test can be used) for the duration of the COVID-19 declaration under Se ction 564(b)(1) of the Act, 21 U.S.C. section 360bbb-3(b)(1), unless the authorization is terminated or revoked sooner.  Performed at [facility], [address] [place], [place] [address]   Culture, Urine     Status: Abnormal   Collection Time: 11/15/20  3:00 PM   Specimen: Urine, Clean Catch  Result Value Ref Range Status   Specimen Description   Final    URINE, CLEAN CATCH Performed at [facility], [address] [place], [place] [address]    Special Requests   Final    NONE Performed at [facility], [address], [place], [place] [address]    Culture (A)  Final    40,000 COLONIES/mL LACTOBACILLUS SPECIES Standardized  susceptibility testing for this organism is not available. Performed at [facility], [address], [place], [place] [address]    Report Status 11/17/2020 FINAL  Final     Labs: Basic Metabolic Panel: Recent Labs  Lab 11/14/20 2329 11/16/20 0429 11/17/20 0600 11/18/20 0419  NA 137 141 138 139  K 3.8 3.4* 3.3* 3.3*  CL 103 107 106 105  CO2 22 25 24 24   GLUCOSE 129* 94 93 91  BUN 14 7* 10 8  CREATININE 0.69 0.61 0.73 0.66  CALCIUM 8.9 8.5* 8.4* 8.7*  MG  --   --  2.0  --    Liver Function Tests: Recent Labs  Lab 11/14/20 2329 11/16/20 0429  AST 26 13*  ALT 6 7  ALKPHOS 47 38  BILITOT 0.7 0.5  PROT 7.2 5.6*  ALBUMIN 4.1 3.1*  Recent Labs  Lab 11/14/20 2329  LIPASE 32   No results for input(s): AMMONIA in the last 168 hours. CBC: Recent Labs  Lab 11/14/20 2329 11/16/20 0429 11/17/20 0600  WBC 9.2 3.4* 4.1  NEUTROABS 7.6  --   --   HGB 10.4* 9.2* 9.1*  HCT 32.1* 30.0* 29.0*  MCV 85.1 88.8 87.9  PLT 250 198 188   Cardiac Enzymes: No results for input(s): CKTOTAL, CKMB, CKMBINDEX, TROPONINI in the last 168 hours. BNP: Invalid input(s): POCBNP CBG: No results for input(s): GLUCAP in the last 168 hours.  Time coordinating discharge:  36 minutes  Signed:  Catarina Hartshorn, DO [facility] Pager: [phone] 11/18/2020, 12:11 PM

## 2020-11-18 NOTE — Telephone Encounter (Signed)
Maria Murphy, please arrange hospital follow-up in 2-4 weeks with any APP: we have all seen her while inpatient but most recently Randall Hiss or myself.

## 2020-11-18 NOTE — Progress Notes (Signed)
Subjective: The less she eats, the better she feels. States "I guess I will starve" until I can get the procedure. Ate fresh fried chicken at the [facility] prior to admission, causing RUQ pain. Took two wings and skinned them prior to eating.   No nausea right now. Had clear liquids last night and does well with clear liquids. Abdominal pain much improved from admission. No overt GI bleeding.   Objective: Vital signs in last 24 hours: Temp:  [97.9 F (36.6 C)-99.1 F (37.3 C)] 97.9 F (36.6 C) (03/28 0527) Pulse Rate:  [54-62] 54 (03/28 0527) Resp:  [20] 20 (03/28 0527) BP: (124-142)/(69-96) 136/69 (03/28 0527) SpO2:  [97 %-99 %] 97 % (03/28 0527) Last BM Date: 11/17/20 General:   Alert and oriented, pleasant Head:  Normocephalic and atraumatic. Abdomen:  Bowel sounds present, soft, moderately TTP RUQ, mild TTP epigastric and LUQ Neurologic:  Alert and  oriented x4 Psych:  Alert and cooperative. Normal mood and affect.  Intake/Output from previous day: 03/27 0701 - 03/28 0700 In: 1905.9 [P.O.:1800; IV Piggyback:105.9] Out: -  Intake/Output this shift: No intake/output data recorded.  Lab Results: Recent Labs    11/16/20 0429 11/17/20 0600  WBC 3.4* 4.1  HGB 9.2* 9.1*  HCT 30.0* 29.0*  PLT 198 188   BMET Recent Labs    11/16/20 0429 11/17/20 0600 11/18/20 0419  NA 141 138 139  K 3.4* 3.3* 3.3*  CL 107 106 105  CO2 25 24 24   GLUCOSE 94 93 91  BUN 7* 10 8  CREATININE 0.61 0.73 0.66  CALCIUM 8.5* 8.4* 8.7*   LFT Recent Labs    11/16/20 0429  PROT 5.6*  ALBUMIN 3.1*  AST 13*  ALT 7  ALKPHOS 38  BILITOT 0.5   PT/INR Recent Labs    11/16/20 0429  LABPROT 13.3  INR 1.1     Assessment: 65 year old female with history of perforated peptic ulcer in August 2021s/p Graham patch, known chronic cholelithiasis, pancreatic mass, history of PE in Sept 2021, and multiple admissions with persistent abdominal pain, N/V, with most recent EGD  in early March 2022 during hospitalization with few gastric erosions, 1 superficial ulcer in pylorus with clean base, large duodenal ulcer in duodenal bulb with presence of suture at bed of ulcer, presenting again with acute on chronic pain.  Abdominal pain: multifactorial in setting of known PUD, cholelithiasis, concern for constipation contributing. Complete bowel purge. Now noting much improvement in abdominal pain since admission and reports eating fried chicken prior to admission but "taking the skin off". No pain noted with fulls/clear liquids and eating small amounts. We discussed only full liquids at this point until she can complete EUS on Wednesday. As she is clinically improved, she is appropriate for discharge home to remain on FULL liquids until after Wednesday. Surgery following and no indication for acute cholecystectomy but will continue to follow her as outpatient once EUS completed.   Pancreatic mass: plans for EGD/EUS 11/20/20 by Dr. Rush Landmark in [place].   History of PE: Sept 2021. Previously on Xarelto. Chest CTA negative for PE.   Anemia: normocytic. no overt GI bleeding. Stable.     Plan: Remain on full liquids PPI BID Carafate QID Keep plans for EUS on 3/30 in [facility] Appropriate for discharge home and patient aware of appointment on Wednesday Further recommendations following EUS   Annitta Needs, PhD, ANP-BC [facility]     LOS: 3 days    11/18/2020, 8:06 AM

## 2020-11-18 NOTE — Progress Notes (Signed)
  Subjective: Patient states her abdominal pain has eased.  She tolerated full liquid diet well.  Objective: Vital signs in last 24 hours: Temp:  [97.9 F (36.6 C)-99.1 F (37.3 C)] 97.9 F (36.6 C) (03/28 0527) Pulse Rate:  [54-62] 54 (03/28 0527) Resp:  [20] 20 (03/28 0527) BP: (124-142)/(69-96) 136/69 (03/28 0527) SpO2:  [97 %-99 %] 97 % (03/28 0527) Last BM Date: 11/17/20  Intake/Output from previous day: 03/27 0701 - 03/28 0700 In: 1905.9 [P.O.:1800; IV Piggyback:105.9] Out: -  Intake/Output this shift: No intake/output data recorded.  General appearance: alert, cooperative and no distress GI: soft, non-tender; bowel sounds normal; no masses,  no organomegaly  Lab Results:  Recent Labs    11/16/20 0429 11/17/20 0600  WBC 3.4* 4.1  HGB 9.2* 9.1*  HCT 30.0* 29.0*  PLT 198 188   BMET Recent Labs    11/17/20 0600 11/18/20 0419  NA 138 139  K 3.3* 3.3*  CL 106 105  CO2 24 24  GLUCOSE 93 91  BUN 10 8  CREATININE 0.73 0.66  CALCIUM 8.4* 8.7*   PT/INR Recent Labs    11/16/20 0429  LABPROT 13.3  INR 1.1    Studies/Results: No results found.  Anti-infectives: Anti-infectives (From admission, onward)   None      Assessment/Plan: Impression: Upper abdominal pain of unknown etiology, much improved.  History of peptic ulcer disease, cholelithiasis Plan: Okay for discharge from surgery standpoint.  Awaiting EUS results prior to any further surgical intervention.  I will see the patient in my office after the EUS.  LOS: 3 days    Aviva Signs 11/18/2020

## 2020-11-19 ENCOUNTER — Encounter (HOSPITAL_COMMUNITY): Payer: Self-pay | Admitting: Physical Therapy

## 2020-11-19 ENCOUNTER — Ambulatory Visit (HOSPITAL_COMMUNITY): Payer: Medicaid Other | Admitting: Physical Therapy

## 2020-11-19 ENCOUNTER — Ambulatory Visit: Payer: Medicaid Other | Admitting: Internal Medicine

## 2020-11-19 ENCOUNTER — Telehealth: Payer: Self-pay

## 2020-11-19 ENCOUNTER — Other Ambulatory Visit: Payer: Self-pay

## 2020-11-19 DIAGNOSIS — M25661 Stiffness of right knee, not elsewhere classified: Secondary | ICD-10-CM

## 2020-11-19 DIAGNOSIS — M25562 Pain in left knee: Secondary | ICD-10-CM | POA: Diagnosis present

## 2020-11-19 DIAGNOSIS — R2689 Other abnormalities of gait and mobility: Secondary | ICD-10-CM | POA: Diagnosis present

## 2020-11-19 DIAGNOSIS — M25662 Stiffness of left knee, not elsewhere classified: Secondary | ICD-10-CM

## 2020-11-19 DIAGNOSIS — R29898 Other symptoms and signs involving the musculoskeletal system: Secondary | ICD-10-CM

## 2020-11-19 DIAGNOSIS — M6281 Muscle weakness (generalized): Secondary | ICD-10-CM

## 2020-11-19 DIAGNOSIS — M25561 Pain in right knee: Secondary | ICD-10-CM

## 2020-11-19 NOTE — Addendum Note (Signed)
Addended by: Mearl Latin on: 11/19/2020 03:54 PM   Modules accepted: Orders

## 2020-11-19 NOTE — Telephone Encounter (Signed)
The pt has been advised and was very thankful for the call.  She will proceed as planned and discuss further with Dr Rush Landmark tomorrow.

## 2020-11-19 NOTE — Telephone Encounter (Signed)
Transition Care Management Follow-up Telephone Call  Date of discharge and from where: 11/18/2020, [facility]   How have you been since you were released from the hospital? She said she is feeling better than before.   Any questions or concerns? Yes - she is anxious to find out the results of her procedure tomorrow.   Items Reviewed:  Did the pt receive and understand the discharge instructions provided? Yes   Medications obtained and verified? Yes  - she has all medications and did not have any questions about her med regime. She said she has been told the hold the xarelto until after 11/22/2020  and she will need instructions with what to do with xarelto after that.   Other? No   Any new allergies since your discharge? No   Do you have support at home? lives alone has a sister that comes to visit on Saturdays.   Home Care and Equipment/Supplies: Were home health services ordered? no If so, what is the name of the agency? n/a  Has the agency set up a time to come to the patient's home? not applicable Were any new equipment or medical supplies ordered?  No What is the name of the medical supply agency? n/a Were you able to get the supplies/equipment? not applicable Do you have any questions related to the use of the equipment or supplies? No  Functional Questionnaire: (I = Independent and D = Dependent) ADLs: she requires some assistance with ADLs.  She said she has been approved for 40 hours PCS/month but she is still waiting for the agency to place an aide.  She has a cane and walker to use with ambulation.    Follow up appointments reviewed:   PCP Hospital f/u appt confirmed? Yes  - Dr Wynetta Emery - 12/09/2020.    [facility] f/u appt confirmed? Yes  - EUS scheduled for tomorrow - 11/20/2020; cardiology - 11/29/2020. She also has outpatient PT and aquatic therapy scheduled    Are transportation arrangements needed? transportation to appointments can be a problem for  her. She has medicaid and said that usually she can contact [facility] transportation for rides.   If their condition worsens, is the pt aware to call PCP or go to the Emergency Dept.? Yes  Was the patient provided with contact information for the PCP's office or ED? Yes  Was to pt encouraged to call back with questions or concerns? Yes

## 2020-11-19 NOTE — Patient Instructions (Signed)
Access Code: [email or web address] URL: [email or web address] Date: 11/19/2020 Prepared by: Mitzi Hansen Nylan Nakatani  Exercises Sidelying Hip Abduction - 1 x daily - 7 x weekly - 2 sets - 20 reps Prone Hip Extension - 1 x daily - 7 x weekly - 2 sets - 15 reps
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
## 2020-11-19 NOTE — Telephone Encounter (Signed)
-----   Message from Irving Copas., MD sent at 11/19/2020  3:52 PM EDT ----- Regarding: Upcoming procedure Maria Murphy, This patient was just readmitted to [facility] with recurrent abdominal pain.  She is scheduled for EGD/EUS tomorrow with me for attempt at pancreatic cyst evaluation and potential aspiration to evaluate and ensure that there is no evidence of cancer.  Looks like she was just discharged yesterday an attempt of trying to get her procedure done but is still having significant abdominal pain and discomfort and not sure if she is able to tolerate oral intake. I am okay to move forward with the planned EGD/EUS attempt tomorrow.  But I think she and her family need to understand that if the duodenal ulcer has not healed been it may not be possible for me to safely sample the cyst (since a needle has to go through the region of where the ulcer has been previously described).  If that is the case, then I will not perform a cyst aspiration if it increases her risk of further complications.  I will talk with her in greater detail tomorrow but if you could just let her know about this upfront so that she is not taken aback by inability to perform a procedure if it is not safe to do so tomorrow. Thanks. GM
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
## 2020-11-20 ENCOUNTER — Ambulatory Visit (HOSPITAL_COMMUNITY)
Admission: RE | Admit: 2020-11-20 | Discharge: 2020-11-20 | Disposition: A | Discharge status: Home / Self Care | Payer: Medicaid Other | Attending: Gastroenterology | Admitting: Gastroenterology

## 2020-11-20 ENCOUNTER — Ambulatory Visit (HOSPITAL_COMMUNITY): Payer: Medicaid Other | Admitting: Anesthesiology

## 2020-11-20 ENCOUNTER — Encounter (HOSPITAL_COMMUNITY): Payer: Self-pay | Admitting: Gastroenterology

## 2020-11-20 ENCOUNTER — Encounter (HOSPITAL_COMMUNITY)
Admission: RE | Disposition: A | Discharge status: Home / Self Care | Payer: Self-pay | Source: Home / Self Care | Attending: Gastroenterology

## 2020-11-20 DIAGNOSIS — Z8349 Family history of other endocrine, nutritional and metabolic diseases: Secondary | ICD-10-CM | POA: Diagnosis not present

## 2020-11-20 DIAGNOSIS — Z8261 Family history of arthritis: Secondary | ICD-10-CM | POA: Diagnosis not present

## 2020-11-20 DIAGNOSIS — K449 Diaphragmatic hernia without obstruction or gangrene: Secondary | ICD-10-CM | POA: Diagnosis not present

## 2020-11-20 DIAGNOSIS — Z86711 Personal history of pulmonary embolism: Secondary | ICD-10-CM | POA: Diagnosis not present

## 2020-11-20 DIAGNOSIS — Z85828 Personal history of other malignant neoplasm of skin: Secondary | ICD-10-CM | POA: Diagnosis not present

## 2020-11-20 DIAGNOSIS — K315 Obstruction of duodenum: Secondary | ICD-10-CM | POA: Diagnosis not present

## 2020-11-20 DIAGNOSIS — K269 Duodenal ulcer, unspecified as acute or chronic, without hemorrhage or perforation: Secondary | ICD-10-CM | POA: Insufficient documentation

## 2020-11-20 DIAGNOSIS — Z811 Family history of alcohol abuse and dependence: Secondary | ICD-10-CM | POA: Diagnosis not present

## 2020-11-20 DIAGNOSIS — K862 Cyst of pancreas: Secondary | ICD-10-CM | POA: Diagnosis not present

## 2020-11-20 DIAGNOSIS — K275 Chronic or unspecified peptic ulcer, site unspecified, with perforation: Secondary | ICD-10-CM

## 2020-11-20 DIAGNOSIS — K259 Gastric ulcer, unspecified as acute or chronic, without hemorrhage or perforation: Secondary | ICD-10-CM | POA: Diagnosis not present

## 2020-11-20 DIAGNOSIS — Z8249 Family history of ischemic heart disease and other diseases of the circulatory system: Secondary | ICD-10-CM | POA: Insufficient documentation

## 2020-11-20 DIAGNOSIS — K209 Esophagitis, unspecified without bleeding: Secondary | ICD-10-CM | POA: Diagnosis not present

## 2020-11-20 DIAGNOSIS — K2100 Gastro-esophageal reflux disease with esophagitis, without bleeding: Secondary | ICD-10-CM | POA: Insufficient documentation

## 2020-11-20 HISTORY — PX: BIOPSY: SHX5522

## 2020-11-20 HISTORY — PX: EUS: SHX5427

## 2020-11-20 HISTORY — PX: ESOPHAGOGASTRODUODENOSCOPY: SHX5428

## 2020-11-20 SURGERY — EGD (ESOPHAGOGASTRODUODENOSCOPY)
Anesthesia: General

## 2020-11-20 MED ORDER — PHENYLEPHRINE 40 MCG/ML (10ML) SYRINGE FOR IV PUSH (FOR BLOOD PRESSURE SUPPORT)
PREFILLED_SYRINGE | INTRAVENOUS | Status: DC | PRN
  Administered 2020-11-20: 40 ug via INTRAVENOUS

## 2020-11-20 MED ORDER — LACTATED RINGERS IV SOLN
INTRAVENOUS | Status: DC
  Administered 2020-11-20: 1000 mL via INTRAVENOUS

## 2020-11-20 MED ORDER — FENTANYL CITRATE (PF) 100 MCG/2ML IJ SOLN
INTRAMUSCULAR | Status: AC
  Filled 2020-11-20: qty 2

## 2020-11-20 MED ORDER — FENTANYL CITRATE (PF) 100 MCG/2ML IJ SOLN
INTRAMUSCULAR | Status: DC | PRN
  Administered 2020-11-20 (×2): 50 ug via INTRAVENOUS

## 2020-11-20 MED ORDER — ONDANSETRON HCL 4 MG/2ML IJ SOLN
INTRAMUSCULAR | Status: DC | PRN
  Administered 2020-11-20: 4 mg via INTRAVENOUS

## 2020-11-20 MED ORDER — SODIUM CHLORIDE 0.9 % IV SOLN: INTRAVENOUS | Status: DC

## 2020-11-20 MED ORDER — PROPOFOL 10 MG/ML IV BOLUS
INTRAVENOUS | Status: DC | PRN
  Administered 2020-11-20: 30 mg via INTRAVENOUS
  Administered 2020-11-20: 120 mg via INTRAVENOUS

## 2020-11-20 MED ORDER — PROPOFOL 500 MG/50ML IV EMUL
INTRAVENOUS | Status: AC
  Filled 2020-11-20: qty 50

## 2020-11-20 MED ORDER — SUCCINYLCHOLINE CHLORIDE 200 MG/10ML IV SOSY
PREFILLED_SYRINGE | INTRAVENOUS | Status: DC | PRN
  Administered 2020-11-20: 120 mg via INTRAVENOUS

## 2020-11-20 MED ORDER — LIDOCAINE 2% (20 MG/ML) 5 ML SYRINGE
INTRAMUSCULAR | Status: DC | PRN
  Administered 2020-11-20: 60 mg via INTRAVENOUS

## 2020-11-20 NOTE — Discharge Instructions (Signed)
YOU HAD AN ENDOSCOPIC PROCEDURE TODAY: Refer to the procedure report and other information in the discharge instructions given to you for any specific questions about what was found during the examination. If this information does not answer your questions, please call [place] office at [phone] to clarify.   YOU SHOULD EXPECT: Some feelings of bloating in the abdomen. Passage of more gas than usual. Walking can help get rid of the air that was put into your GI tract during the procedure and reduce the bloating. If you had a lower endoscopy (such as a colonoscopy or flexible sigmoidoscopy) you may notice spotting of blood in your stool or on the toilet paper. Some abdominal soreness may be present for a day or two, also.  DIET: Your first meal following the procedure should be a light meal and then it is ok to progress to your normal diet. A half-sandwich or bowl of soup is an example of a good first meal. Heavy or fried foods are harder to digest and may make you feel nauseous or bloated. Drink plenty of fluids but you should avoid alcoholic beverages for 24 hours. If you had a esophageal dilation, please see attached instructions for diet.    ACTIVITY: Your care partner should take you home directly after the procedure. You should plan to take it easy, moving slowly for the rest of the day. You can resume normal activity the day after the procedure however YOU SHOULD NOT DRIVE, use power tools, machinery or perform tasks that involve climbing or major physical exertion for 24 hours (because of the sedation medicines used during the test).   SYMPTOMS TO REPORT IMMEDIATELY: A gastroenterologist can be reached at any hour. Please call [phone]  for any of the following symptoms:   Following upper endoscopy (EGD, EUS, ERCP, esophageal dilation) Vomiting of blood or coffee ground material  New, significant abdominal pain  New, significant chest pain or pain under the shoulder blades  Painful or  persistently difficult swallowing  New shortness of breath  Black, tarry-looking or red, bloody stools  FOLLOW UP:  If any biopsies were taken you will be contacted by phone or by letter within the next 1-3 weeks. Call [phone]  if you have not heard about the biopsies in 3 weeks.  Please also call with any specific questions about appointments or follow up tests.  

## 2020-11-20 NOTE — Anesthesia Procedure Notes (Signed)
Procedure Name: Intubation Date/Time: 11/20/2020 10:20 AM Performed by: Sharlette Dense, CRNA Patient Re-evaluated:Patient Re-evaluated prior to induction Oxygen Delivery Method: Circle system utilized Preoxygenation: Pre-oxygenation with 100% oxygen Induction Type: IV induction Laryngoscope Size: Miller and 2 Grade View: Grade I Tube type: Oral Tube size: 7.0 mm Number of attempts: 1 Airway Equipment and Method: Stylet Placement Confirmation: ETT inserted through vocal cords under direct vision,  positive ETCO2 and breath sounds checked- equal and bilateral Secured at: 22 cm Tube secured with: Tape Dental Injury: Teeth and Oropharynx as per pre-operative assessment

## 2020-11-20 NOTE — Transfer of Care (Signed)
Immediate Anesthesia Transfer of Care Note  Patient: Maria Murphy  Procedure(s) Performed: UPPER ENDOSCOPIC ULTRASOUND (EUS) LINEAR (N/A ) ESOPHAGOGASTRODUODENOSCOPY (EGD) (N/A ) BIOPSY  Patient Location: Endoscopy Unit  Anesthesia Type:General  Level of Consciousness: awake  Airway & Oxygen Therapy: Patient Spontanous Breathing and Patient connected to face mask oxygen  Post-op Assessment: Report given to RN and Post -op Vital signs reviewed and stable  Post vital signs: Reviewed and stable  Last Vitals:  Vitals Value Taken Time  BP    Temp    Pulse    Resp    SpO2      Last Pain:  Vitals:   11/20/20 0933  TempSrc: Oral  PainSc: 0-No pain         Complications: No complications documented.

## 2020-11-20 NOTE — Anesthesia Postprocedure Evaluation (Signed)
Anesthesia Post Note  Patient: Maria Murphy  Procedure(s) Performed: UPPER ENDOSCOPIC ULTRASOUND (EUS) LINEAR (N/A ) ESOPHAGOGASTRODUODENOSCOPY (EGD) (N/A ) BIOPSY     Patient location during evaluation: PACU Anesthesia Type: General Level of consciousness: awake and alert Pain management: pain level controlled Vital Signs Assessment: post-procedure vital signs reviewed and stable Respiratory status: spontaneous breathing, nonlabored ventilation, respiratory function stable and patient connected to nasal cannula oxygen Cardiovascular status: blood pressure returned to baseline and stable Postop Assessment: no apparent nausea or vomiting Anesthetic complications: no   No complications documented.  Last Vitals:  Vitals:   11/20/20 1140 11/20/20 1145  BP: (!) 163/89   Pulse: 63 62  Resp: 17 15  Temp:    SpO2: 98% 99%    Last Pain:  Vitals:   11/20/20 1140  TempSrc:   PainSc: 0-No pain                 Tiajuana Amass

## 2020-11-20 NOTE — Op Note (Signed)
[facility] Patient Name: Maria Murphy Procedure Date: 11/20/2020 MRN: [id number] Attending MD: Justice Britain , MD Date of Birth: 1956-03-07 CSN: [id number] Age: 65 Admit Type: Outpatient Procedure:                Upper EUS Indications:              Pancreatic cyst on CT scan, Pancreatic cyst on                            MRCP, Follow-up of duodenal ulcer (with hemorrhage,                            perforation, and obstruction) Providers:                Justice Britain, MD, Baird Cancer, RN, Nelia Shi, RN, Laverda Sorenson, Technician, Danley Danker, CRNA Referring MD:             [name], Ladell Pier, Becky Sax Medicines:                General Anesthesia Complications:            No immediate complications. Estimated Blood Loss:     Estimated blood loss was minimal. Procedure:                Pre-Anesthesia Assessment:                           - Prior to the procedure, a History and Physical                            was performed, and patient medications and                            allergies were reviewed. The patient's tolerance of                            previous anesthesia was also reviewed. The risks                            and benefits of the procedure and the sedation                            options and risks were discussed with the patient.                            All questions were answered, and informed consent                            was obtained. Prior Anticoagulants: The patient has  taken no previous anticoagulant or antiplatelet                            agents. ASA Grade Assessment: III - A patient with                            severe systemic disease. After reviewing the risks                            and benefits, the patient was deemed in                            satisfactory condition to undergo the  procedure.                           After obtaining informed consent, the endoscope was                            passed under direct vision. Throughout the                            procedure, the patient's blood pressure, pulse, and                            oxygen saturations were monitored continuously. The                            [id number]-[id number] ([id number]) Olympus gastroscope was                            introduced through the mouth, and advanced to the                            second part of duodenum. The [id number] ([id number])                            Olympus Linear EUS was introduced through the                            mouth, and advanced to the duodenum for ultrasound                            examination from the stomach and duodenum. The                            upper EUS was accomplished without difficulty. The                            patient tolerated the procedure. Scope In: Scope Out: Findings:      ENDOSCOPIC FINDING: :      No gross lesions were noted in the proximal esophagus and in the mid       esophagus.      LA Grade C (one or more mucosal breaks continuous between tops  of 2 or       more mucosal folds, less than 75% circumference) esophagitis with no       bleeding was found in the distal esophagus ($RemoveBeforeDEI'@31'[id number]$ -35 cm).      The Z-line was irregular and was found 35 cm from the incisors.      A 3 cm hiatal hernia was present.      A few non-bleeding cratered gastric ulcers with a clean ulcer base       (Forrest Class III) were found in the gastric body and in the gastric       antrum. The largest lesion was 8 mm in largest dimension.      Patchy mildly erythematous mucosa without bleeding was found in the       gastric body.      A deformity was found in the prepyloric region of the stomach into the       pylorus and the proximal duodenal bulb.      No other gross lesions were noted in the entire examined stomach.       Biopsies were taken with a cold forceps  for histology and Helicobacter       pylori testing.      One non-bleeding cratered duodenal ulcer with a clean ulcer base       (Forrest Class III) was found in the duodenal bulb. The lesion was 22 mm       in largest dimension. Evidence of suture material was noted. Biopsies       were taken with a cold forceps for histology from the edge of the lesion.      The ulceration and previous surgical intervention led to a small       duodenal bulb. However, as a result of the edge of the duodenal ulcer,       an acquired benign-appearing, intrinsic moderate stenosis was found in       the first portion of the duodenum and in the second portion of the       duodenum and was traversed with pressure using the adult endoscope.       After passage of the scope, there was evidence of mucosal wrent/tear in       this region suggestive some effect.      No gross lesions were noted in the second portion of the duodenum.       Biopsies for histology were taken with a cold forceps for evaluation of       celiac disease due to reported wheat allergy.      ENDOSONOGRAPHIC FINDING: :      The Linear EUS scope could only partially traverse into the duodenal       bulb. I was able to see an anechoic lesion suggestive of a cyst was       identified in the pancreatic head. It is not in obvious communication       with the pancreatic duct. The lesion measured 28 mm by 30 mm in maximal       cross-sectional diameter. There was a single compartment without septae.       The outer wall of the lesion was thin. There was no associated mass.       There was no internal debris within the fluid-filled cavity. There       appeared to be a blood vessel in front of this cyst. As I could not get  complete evaluation/view of this area attempt at sampling this cyst was       felt to not be possible at this time. The caveat to this is that I could       not have adequate seating in the duodenal bulb due to the size of the        cratered ulcer and as such complete evaluation of this cyst is not       possible.      There was no sign of significant endosonographic abnormality in the genu       of the pancreas (2.1 mm), pancreatic body (1.5 mm) and pancreatic tail       (1.6 mm -> 1.2 mm).      Endosonographic imaging in the visualized portion of the liver showed no       mass.      No malignant-appearing lymph nodes were visualized in the celiac region       (level 20), peripancreatic region and porta hepatis region.      The celiac region was visualized.      Ampulla as well as the entirety of the pancreatic head/uncinate process       not able to be visualized due to the duodenal ulcer and deformity at the       D1/D2 angle since the EUS scope could not traverse the duodenal bulb       safely. Impression:               EGD Impression:                           - No gross lesions in esophagus proximally.                           - LA Grade C esophagitis with no bleeding distally.                           - Z-line irregular, 35 cm from the incisors.                           - 3 cm hiatal hernia present.                           - Non-bleeding gastric ulcers with a clean ulcer                            base (Forrest Class III).                           - Erythematous mucosa in the gastric body.                           - No other gross lesions in the stomach. Biopsied                            stomach for HP.                           - Post-surgical deformity in the prepyloric region  moving into the pylorus and proximal duodenum.                           - A large non-bleeding duodenal ulcer with a clean                            ulcer base (Forrest Class III) with evidence of                            suture material present within the ulcer bed was                            found. This was biopsied.                           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                            The Adult EGD scope traversed this with gentle                            pressure and mucosal wrent was noted in this area                            at which point further passage of the adult EGD                            scope was easier..                           - No gross lesions in the second portion of the                            duodenum. Biopsied.                           EUS Impression:                           - A cystic lesion was seen in the pancreatic head                            but the entire head of pancreas or cystic region                            could not be evaluated completely as a result of  the large, still healing, duodenal ulcer that is                            present. Seating of the Linear EUS further into the                            area was felt to be unsafe due to the size and the                            already stenosed region noted on the EGD                            examination.                           - There was no sign of significant pathology in the                            genu of the pancreas, pancreatic body and                            pancreatic tail (this area was completel                            visualized) and without overt ductal dilitation,                            this makes more sinister pancreatic lesions felt to                            be less likely.                           - No malignant-appearing lymph nodes were                            visualized in the celiac region (level 20),                            peripancreatic region and porta hepatis region.                           - As noted above, ability to visualize ampulla and                            entire pancreatic  head/uncinate not possible. Moderate Sedation:      Not Applicable - Patient had care per Anesthesia. Recommendation:           - The patient will be observed post-procedure,                            until all discharge criteria are met.                           -  Discharge patient to home.                           - Patient has a contact number available for                            emergencies. The signs and symptoms of potential                            delayed complications were discussed with the                            patient. Return to normal activities tomorrow.                            Written discharge instructions were provided to the                            patient.                           - Full liquid diet.                           - Observe patient's clinical course.                           - Await path results.                           - Continue PPI twice daily.                           - Continue Carafate with each meal and at bedtime                            (make sure to not take any medication 1 hour prior                            or after Carafate ingestion, otherwise we will not                            have complete absorption occur of the other                            medications).                           - Absolutely no NSAIDs.                           - No alcohol consumption.                           - No tobacco use.                           -  Defer initiation or re-initiation of                            anticoagulation to the patient's primary medical                            team. If necessary, then restart in 24-48 hours is                            OK, but again, as I was not part of previous                            discussions as to whether she would restart or not,                            I leave this up to her primary providers to decide.                           - Repeat EGD/EUS attempt in 2-3 months  is                            reasonable with hope that she continues to have                            healing.                           - If patient fails outpatient treatment, then will                            need to consider surgical re-intervention for                            non-healing ulcer. Although this is my first                            evaluation of the region, if the previous sizing                            was correct, things should be healing based on the                            size of the ulcer at this time.                           - Follow up with [facility] in interim.                           - The findings and recommendations were discussed                            with the patient. Procedure Code(s):        --- Professional ---  79150, Esophagogastroduodenoscopy, flexible,                            transoral; with endoscopic ultrasound examination                            limited to the esophagus, stomach or duodenum, and                            adjacent structures                           43239, Esophagogastroduodenoscopy, flexible,                            transoral; with biopsy, single or multiple Diagnosis Code(s):        --- Professional ---                           K20.90, Esophagitis, unspecified without bleeding                           K22.8, Other specified diseases of esophagus                           K25.9, Gastric ulcer, unspecified as acute or                            chronic, without hemorrhage or perforation                           K31.89, Other diseases of stomach and duodenum                           K91.89, Other postprocedural complications and                            disorders of digestive system                           K26.9, Duodenal ulcer, unspecified as acute or                            chronic, without hemorrhage or perforation                           K31.5,  Obstruction of duodenum                           K86.2, Cyst of pancreas                           I89.9, Noninfective disorder of lymphatic vessels                            and lymph nodes, unspecified  K26.6, Chronic or unspecified duodenal ulcer with                            both hemorrhage and perforation CPT copyright 2019 [employer]. All rights reserved. The codes documented in this report are preliminary and upon coder review may  be revised to meet current compliance requirements. Justice Britain, MD 11/20/2020 11:28:03 AM Number of Addenda: 0

## 2020-11-20 NOTE — H&P (Signed)
GASTROENTEROLOGY PROCEDURE H&P NOTE   Primary Care Physician: Ladell Pier, MD  HPI: Maria Murphy is a 65 y.o. female who presents for EGD/EUS to evaluate Pancreatic Cyst >4 cm (stable since 10/21) and follow up of previous ulcer s/p Phillip Heal patch of the duodenal bulb with persistent pain/difficulty with food ingestion.  Past Medical History:  Diagnosis Date  . Collagen vascular disease ([place])   . Essential hypertension, benign 01/24/2015  . Foot ulcer ([place])   . GERD (gastroesophageal reflux disease)   . Hypertension   . Memory loss   . PUD (peptic ulcer disease)    with perforation s/p exploratory laparotomy  . Pulmonary embolus ([place]) 04/2020  . Skin cancer   . Vision abnormalities    Past Surgical History:  Procedure Laterality Date  . CENTRAL LINE INSERTION Right 04/22/2020   Procedure: CENTRAL LINE INSERTION;  Surgeon: Virl Cagey, MD;  Location: [facility];  Service: General;  Laterality: Right;  . DIAGNOSTIC LAPAROSCOPY    . ESOPHAGOGASTRODUODENOSCOPY (EGD) WITH PROPOFOL N/A 07/10/2020   Procedure: ESOPHAGOGASTRODUODENOSCOPY (EGD) WITH PROPOFOL;  Surgeon: Rogene Houston, MD;  Location: [facility];  Service: Endoscopy;  Laterality: N/A;  . ESOPHAGOGASTRODUODENOSCOPY (EGD) WITH PROPOFOL N/A 10/18/2020   Procedure: ESOPHAGOGASTRODUODENOSCOPY (EGD) WITH PROPOFOL;  Surgeon: Harvel Quale, MD;  Location: [facility];  Service: Gastroenterology;  Laterality: N/A;  . GASTRORRHAPHY  04/22/2020   Procedure: GASTRORRHAPHY;  Surgeon: Virl Cagey, MD;  Location: [facility];  Service: General;;  . IR CATHETER TUBE CHANGE  05/09/2020  . LAPAROTOMY N/A 04/22/2020   Procedure: EXPLORATORY LAPAROTOMY;  Surgeon: Virl Cagey, MD;  Location: [facility];  Service: General;  Laterality: N/A;  . TONSILLECTOMY     Current Facility-Administered Medications  Medication Dose Route Frequency Provider Last Rate Last Admin  . 0.9 %  sodium chloride infusion   Intravenous  Continuous Mansouraty, Telford Nab., MD      . lactated ringers infusion   Intravenous Continuous Mansouraty, Telford Nab., MD       Allergies  Allergen Reactions  . Wheat Extract Swelling   Family History  Problem Relation Age of Onset  . High Cholesterol Mother   . Hypertension Mother   . Arthritis/Rheumatoid Mother   . Alcohol abuse Father   . Hypertension Maternal Grandmother   . Colon cancer Neg Hx   . Colon polyps Neg Hx   . Inflammatory bowel disease Neg Hx    Social History   Socioeconomic History  . Marital status: Widowed    Spouse name: Not on file  . Number of children: 2  . Years of education: Not on file  . Highest education level: Not on file  Occupational History  . Not on file  Tobacco Use  . Smoking status: Never Smoker  . Smokeless tobacco: Never Used  Vaping Use  . Vaping Use: Never used  Substance and Sexual Activity  . Alcohol use: Yes    Alcohol/week: 2.0 - 3.0 standard drinks    Types: 2 - 3 Glasses of wine per week  . Drug use: No  . Sexual activity: Not Currently    Birth control/protection: Post-menopausal  Other Topics Concern  . Not on file  Social History Narrative  . Not on file   Social Determinants of Health   Financial Resource Strain: Not on file  Food Insecurity: Not on file  Transportation Needs: Not on file  Physical Activity: Not on file  Stress: Not on file  Social  Connections: Not on file  Intimate Partner Violence: Not on file    Physical Exam: Vital signs in last 24 hours: Temp:  [98.6 F (37 C)] 98.6 F (37 C) (03/30 0933) Pulse Rate:  [54] 54 (03/30 0933) Resp:  [14] 14 (03/30 0933) BP: (146)/(75) 146/75 (03/30 0933) SpO2:  [98 %] 98 % (03/30 0933) Weight:  [79.4 kg] 79.4 kg (03/30 0933)   GEN: NAD EYE: Sclerae anicteric ENT: MMM CV: Non-tachycardic GI: Soft, protuberant, TTP throughout upper abdomen 4-5/10 pain currently  NEURO:  Alert & Oriented x 3  Lab Results: No results for input(s): WBC, HGB,  HCT, PLT in the last 72 hours. BMET Recent Labs    11/18/20 0419  NA 139  K 3.3*  CL 105  CO2 24  GLUCOSE 91  BUN 8  CREATININE 0.66  CALCIUM 8.7*   LFT No results for input(s): PROT, ALBUMIN, AST, ALT, ALKPHOS, BILITOT, BILIDIR, IBILI in the last 72 hours. PT/INR No results for input(s): LABPROT, INR in the last 72 hours.   Impression / Plan: This is a 65 y.o.female who presents for EGD/EUS to evaluate Pancreatic Cyst >4 cm (stable since 10/21) and follow up of previous ulcer s/p Phillip Heal patch of the duodenal bulb with persistent pain/difficulty with food ingestion.  The risks of an EUS including intestinal perforation, bleeding, infection, aspiration, and medication effects were discussed as was the possibility it may not give a definitive diagnosis if a biopsy is performed.  When a biopsy of the pancreas is done as part of the EUS, there is an additional risk of pancreatitis at the rate of about 1-2%.  It was explained that procedure related pancreatitis is typically mild, although it can be severe and even life threatening, which is why we do not perform random pancreatic biopsies and only biopsy a lesion/area we feel is concerning enough to warrant the risk.  The risks and benefits of endoscopic evaluation were discussed with the patient; these include but are not limited to the risk of perforation, infection, bleeding, missed lesions, lack of diagnosis, severe illness requiring hospitalization, as well as anesthesia and sedation related illnesses.  The patient is agreeable to proceed.    Justice Britain, MD [facility] Office # [phone]

## 2020-11-20 NOTE — Anesthesia Preprocedure Evaluation (Addendum)
Anesthesia Evaluation  Patient identified by MRN, date of birth, ID band Patient awake    Reviewed: Allergy & Precautions, NPO status , Patient's Chart, lab work & pertinent test results  Airway Mallampati: II  TM Distance: >3 FB     Dental  (+) Dental Advisory Given   Pulmonary neg pulmonary ROS,    breath sounds clear to auscultation       Cardiovascular hypertension, Pt. on medications  Rhythm:Regular Rate:Normal     Neuro/Psych  Neuromuscular disease    GI/Hepatic Neg liver ROS, PUD, GERD  ,  Endo/Other  negative endocrine ROS  Renal/GU negative Renal ROS     Musculoskeletal  (+) Arthritis ,   Abdominal   Peds  Hematology  (+) anemia ,   Anesthesia Other Findings   Reproductive/Obstetrics                             Anesthesia Physical Anesthesia Plan  ASA: III  Anesthesia Plan: General   Post-op Pain Management:    Induction: Intravenous and Rapid sequence  PONV Risk Score and Plan: 2 and Ondansetron and Dexamethasone  Airway Management Planned: Oral ETT  Additional Equipment:   Intra-op Plan:   Post-operative Plan: Extubation in OR  Informed Consent: I have reviewed the patients History and Physical, chart, labs and discussed the procedure including the risks, benefits and alternatives for the proposed anesthesia with the patient or authorized representative who has indicated his/her understanding and acceptance.     Dental advisory given  Plan Discussed with: CRNA  Anesthesia Plan Comments:        Anesthesia Quick Evaluation

## 2020-11-21 ENCOUNTER — Encounter (HOSPITAL_COMMUNITY): Payer: Medicaid Other | Admitting: Physical Therapy

## 2020-11-21 ENCOUNTER — Encounter: Payer: Self-pay | Admitting: Gastroenterology

## 2020-11-21 LAB — SURGICAL PATHOLOGY

## 2020-11-22 ENCOUNTER — Encounter (HOSPITAL_COMMUNITY): Payer: Self-pay

## 2020-11-22 ENCOUNTER — Emergency Department (HOSPITAL_COMMUNITY): Payer: Medicaid Other

## 2020-11-22 ENCOUNTER — Inpatient Hospital Stay (HOSPITAL_COMMUNITY)
Admission: EM | Admit: 2020-11-22 | Discharge: 2020-11-28 | DRG: 384 | Disposition: A | Discharge status: Home Health | Payer: Medicaid Other | Attending: Internal Medicine | Admitting: Internal Medicine

## 2020-11-22 ENCOUNTER — Other Ambulatory Visit: Payer: Self-pay

## 2020-11-22 DIAGNOSIS — Z86711 Personal history of pulmonary embolism: Secondary | ICD-10-CM

## 2020-11-22 DIAGNOSIS — K8020 Calculus of gallbladder without cholecystitis without obstruction: Secondary | ICD-10-CM

## 2020-11-22 DIAGNOSIS — F32A Depression, unspecified: Secondary | ICD-10-CM | POA: Diagnosis present

## 2020-11-22 DIAGNOSIS — K2100 Gastro-esophageal reflux disease with esophagitis, without bleeding: Secondary | ICD-10-CM | POA: Diagnosis present

## 2020-11-22 DIAGNOSIS — K267 Chronic duodenal ulcer without hemorrhage or perforation: Principal | ICD-10-CM | POA: Diagnosis present

## 2020-11-22 DIAGNOSIS — Z20822 Contact with and (suspected) exposure to covid-19: Secondary | ICD-10-CM | POA: Diagnosis present

## 2020-11-22 DIAGNOSIS — R109 Unspecified abdominal pain: Secondary | ICD-10-CM | POA: Diagnosis present

## 2020-11-22 DIAGNOSIS — Z7901 Long term (current) use of anticoagulants: Secondary | ICD-10-CM

## 2020-11-22 DIAGNOSIS — E876 Hypokalemia: Secondary | ICD-10-CM | POA: Diagnosis present

## 2020-11-22 DIAGNOSIS — Z79899 Other long term (current) drug therapy: Secondary | ICD-10-CM

## 2020-11-22 DIAGNOSIS — D62 Acute posthemorrhagic anemia: Secondary | ICD-10-CM | POA: Diagnosis present

## 2020-11-22 DIAGNOSIS — K869 Disease of pancreas, unspecified: Secondary | ICD-10-CM

## 2020-11-22 DIAGNOSIS — Z8249 Family history of ischemic heart disease and other diseases of the circulatory system: Secondary | ICD-10-CM

## 2020-11-22 DIAGNOSIS — Z91018 Allergy to other foods: Secondary | ICD-10-CM

## 2020-11-22 DIAGNOSIS — D259 Leiomyoma of uterus, unspecified: Secondary | ICD-10-CM | POA: Diagnosis present

## 2020-11-22 DIAGNOSIS — K219 Gastro-esophageal reflux disease without esophagitis: Secondary | ICD-10-CM | POA: Diagnosis present

## 2020-11-22 DIAGNOSIS — K257 Chronic gastric ulcer without hemorrhage or perforation: Secondary | ICD-10-CM | POA: Diagnosis present

## 2020-11-22 DIAGNOSIS — Z811 Family history of alcohol abuse and dependence: Secondary | ICD-10-CM

## 2020-11-22 DIAGNOSIS — Z83438 Family history of other disorder of lipoprotein metabolism and other lipidemia: Secondary | ICD-10-CM

## 2020-11-22 DIAGNOSIS — K277 Chronic peptic ulcer, site unspecified, without hemorrhage or perforation: Secondary | ICD-10-CM

## 2020-11-22 DIAGNOSIS — R1010 Upper abdominal pain, unspecified: Secondary | ICD-10-CM

## 2020-11-22 DIAGNOSIS — K862 Cyst of pancreas: Secondary | ICD-10-CM | POA: Diagnosis present

## 2020-11-22 DIAGNOSIS — I700 Atherosclerosis of aorta: Secondary | ICD-10-CM | POA: Diagnosis present

## 2020-11-22 DIAGNOSIS — I10 Essential (primary) hypertension: Secondary | ICD-10-CM | POA: Diagnosis present

## 2020-11-22 DIAGNOSIS — Z85828 Personal history of other malignant neoplasm of skin: Secondary | ICD-10-CM

## 2020-11-22 LAB — CBC WITH DIFFERENTIAL/PLATELET
Abs Immature Granulocytes: 0.03 10*3/uL (ref 0.00–0.07)
Basophils Absolute: 0 10*3/uL (ref 0.0–0.1)
Basophils Relative: 0 %
Eosinophils Absolute: 0.1 10*3/uL (ref 0.0–0.5)
Eosinophils Relative: 1 %
HCT: 30.8 % — ABNORMAL LOW (ref 36.0–46.0)
Hemoglobin: 9.8 g/dL — ABNORMAL LOW (ref 12.0–15.0)
Immature Granulocytes: 0 %
Lymphocytes Relative: 15 %
Lymphs Abs: 1.4 10*3/uL (ref 0.7–4.0)
MCH: 27.8 pg (ref 26.0–34.0)
MCHC: 31.8 g/dL (ref 30.0–36.0)
MCV: 87.3 fL (ref 80.0–100.0)
Monocytes Absolute: 0.5 10*3/uL (ref 0.1–1.0)
Monocytes Relative: 6 %
Neutro Abs: 6.9 10*3/uL (ref 1.7–7.7)
Neutrophils Relative %: 78 %
Platelets: 214 10*3/uL (ref 150–400)
RBC: 3.53 MIL/uL — ABNORMAL LOW (ref 3.87–5.11)
RDW: 15.7 % — ABNORMAL HIGH (ref 11.5–15.5)
WBC: 8.9 10*3/uL (ref 4.0–10.5)
nRBC: 0 % (ref 0.0–0.2)

## 2020-11-22 LAB — COMPREHENSIVE METABOLIC PANEL
ALT: 8 U/L (ref 0–44)
AST: 22 U/L (ref 15–41)
Albumin: 4 g/dL (ref 3.5–5.0)
Alkaline Phosphatase: 51 U/L (ref 38–126)
Anion gap: 10 (ref 5–15)
BUN: 13 mg/dL (ref 8–23)
CO2: 22 mmol/L (ref 22–32)
Calcium: 8.9 mg/dL (ref 8.9–10.3)
Chloride: 105 mmol/L (ref 98–111)
Creatinine, Ser: 0.75 mg/dL (ref 0.44–1.00)
GFR, Estimated: >60 mL/min (ref >60)
Glucose, Bld: 125 mg/dL — ABNORMAL HIGH (ref 70–99)
Potassium: 3.4 mmol/L — ABNORMAL LOW (ref 3.5–5.1)
Sodium: 137 mmol/L (ref 135–145)
Total Bilirubin: 0.4 mg/dL (ref 0.3–1.2)
Total Protein: 7.3 g/dL (ref 6.5–8.1)

## 2020-11-22 LAB — LIPASE, BLOOD: Lipase: 31 U/L (ref 11–51)

## 2020-11-22 MED ORDER — SODIUM CHLORIDE 0.9 % IV BOLUS
500.0000 mL | Freq: Once | INTRAVENOUS | Status: AC
  Administered 2020-11-22: 500 mL via INTRAVENOUS

## 2020-11-22 MED ORDER — PANTOPRAZOLE SODIUM 40 MG IV SOLR
40.0000 mg | Freq: Once | INTRAVENOUS | Status: AC
  Administered 2020-11-22: 40 mg via INTRAVENOUS
  Filled 2020-11-22: qty 40

## 2020-11-22 MED ORDER — HYDROMORPHONE HCL 1 MG/ML IJ SOLN
0.5000 mg | Freq: Once | INTRAMUSCULAR | Status: AC
Start: 2020-11-22 — End: 2020-11-22
  Administered 2020-11-22: 0.5 mg via INTRAVENOUS
  Filled 2020-11-22: qty 1

## 2020-11-22 MED ORDER — ONDANSETRON HCL 4 MG/2ML IJ SOLN
4.0000 mg | Freq: Once | INTRAMUSCULAR | Status: AC
  Administered 2020-11-22: 4 mg via INTRAVENOUS
  Filled 2020-11-22: qty 2

## 2020-11-22 NOTE — ED Notes (Signed)
Pt returned from CT Scan 

## 2020-11-22 NOTE — ED Triage Notes (Signed)
Pt to er, pt states that she is here for RLQ pain, states that she had minor pain, but then her sister had her drink a large glass of water and she had increased pain, pt moaning in hospital bed.  Pt states that she had a test on Wednesday where they told her that she had a stricture.
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
## 2020-11-23 DIAGNOSIS — Z79899 Other long term (current) drug therapy: Secondary | ICD-10-CM | POA: Diagnosis not present

## 2020-11-23 DIAGNOSIS — E876 Hypokalemia: Secondary | ICD-10-CM | POA: Diagnosis present

## 2020-11-23 DIAGNOSIS — K8689 Other specified diseases of pancreas: Secondary | ICD-10-CM

## 2020-11-23 DIAGNOSIS — K862 Cyst of pancreas: Secondary | ICD-10-CM | POA: Diagnosis present

## 2020-11-23 DIAGNOSIS — I10 Essential (primary) hypertension: Secondary | ICD-10-CM | POA: Diagnosis present

## 2020-11-23 DIAGNOSIS — D259 Leiomyoma of uterus, unspecified: Secondary | ICD-10-CM | POA: Diagnosis present

## 2020-11-23 DIAGNOSIS — Z8249 Family history of ischemic heart disease and other diseases of the circulatory system: Secondary | ICD-10-CM | POA: Diagnosis not present

## 2020-11-23 DIAGNOSIS — Z7901 Long term (current) use of anticoagulants: Secondary | ICD-10-CM | POA: Diagnosis not present

## 2020-11-23 DIAGNOSIS — Z91018 Allergy to other foods: Secondary | ICD-10-CM | POA: Diagnosis not present

## 2020-11-23 DIAGNOSIS — R1013 Epigastric pain: Secondary | ICD-10-CM | POA: Diagnosis present

## 2020-11-23 DIAGNOSIS — R1010 Upper abdominal pain, unspecified: Secondary | ICD-10-CM | POA: Diagnosis not present

## 2020-11-23 DIAGNOSIS — R1031 Right lower quadrant pain: Secondary | ICD-10-CM

## 2020-11-23 DIAGNOSIS — K269 Duodenal ulcer, unspecified as acute or chronic, without hemorrhage or perforation: Secondary | ICD-10-CM

## 2020-11-23 DIAGNOSIS — Z83438 Family history of other disorder of lipoprotein metabolism and other lipidemia: Secondary | ICD-10-CM | POA: Diagnosis not present

## 2020-11-23 DIAGNOSIS — D62 Acute posthemorrhagic anemia: Secondary | ICD-10-CM | POA: Diagnosis present

## 2020-11-23 DIAGNOSIS — K8020 Calculus of gallbladder without cholecystitis without obstruction: Secondary | ICD-10-CM | POA: Diagnosis present

## 2020-11-23 DIAGNOSIS — Z811 Family history of alcohol abuse and dependence: Secondary | ICD-10-CM | POA: Diagnosis not present

## 2020-11-23 DIAGNOSIS — K277 Chronic peptic ulcer, site unspecified, without hemorrhage or perforation: Secondary | ICD-10-CM | POA: Diagnosis not present

## 2020-11-23 DIAGNOSIS — K219 Gastro-esophageal reflux disease without esophagitis: Secondary | ICD-10-CM | POA: Diagnosis not present

## 2020-11-23 DIAGNOSIS — Z86711 Personal history of pulmonary embolism: Secondary | ICD-10-CM | POA: Diagnosis not present

## 2020-11-23 DIAGNOSIS — Z20822 Contact with and (suspected) exposure to covid-19: Secondary | ICD-10-CM | POA: Diagnosis present

## 2020-11-23 DIAGNOSIS — F32A Depression, unspecified: Secondary | ICD-10-CM | POA: Diagnosis present

## 2020-11-23 DIAGNOSIS — K257 Chronic gastric ulcer without hemorrhage or perforation: Secondary | ICD-10-CM | POA: Diagnosis present

## 2020-11-23 DIAGNOSIS — I700 Atherosclerosis of aorta: Secondary | ICD-10-CM | POA: Diagnosis present

## 2020-11-23 DIAGNOSIS — K2100 Gastro-esophageal reflux disease with esophagitis, without bleeding: Secondary | ICD-10-CM | POA: Diagnosis present

## 2020-11-23 DIAGNOSIS — K267 Chronic duodenal ulcer without hemorrhage or perforation: Secondary | ICD-10-CM | POA: Diagnosis present

## 2020-11-23 DIAGNOSIS — Z85828 Personal history of other malignant neoplasm of skin: Secondary | ICD-10-CM | POA: Diagnosis not present

## 2020-11-23 DIAGNOSIS — K869 Disease of pancreas, unspecified: Secondary | ICD-10-CM | POA: Diagnosis not present

## 2020-11-23 DIAGNOSIS — G8929 Other chronic pain: Secondary | ICD-10-CM | POA: Diagnosis not present

## 2020-11-23 LAB — SARS CORONAVIRUS 2 (TAT 6-24 HRS): SARS Coronavirus 2: NEGATIVE

## 2020-11-23 LAB — MAGNESIUM: Magnesium: 1.8 mg/dL (ref 1.7–2.4)

## 2020-11-23 LAB — COMPREHENSIVE METABOLIC PANEL
ALT: 7 U/L (ref 0–44)
AST: 13 U/L — ABNORMAL LOW (ref 15–41)
Albumin: 3.1 g/dL — ABNORMAL LOW (ref 3.5–5.0)
Alkaline Phosphatase: 39 U/L (ref 38–126)
Anion gap: 9 (ref 5–15)
BUN: 10 mg/dL (ref 8–23)
CO2: 23 mmol/L (ref 22–32)
Calcium: 8.2 mg/dL — ABNORMAL LOW (ref 8.9–10.3)
Chloride: 108 mmol/L (ref 98–111)
Creatinine, Ser: 0.61 mg/dL (ref 0.44–1.00)
GFR, Estimated: >60 mL/min (ref >60)
Glucose, Bld: 98 mg/dL (ref 70–99)
Potassium: 3.3 mmol/L — ABNORMAL LOW (ref 3.5–5.1)
Sodium: 140 mmol/L (ref 135–145)
Total Bilirubin: 0.4 mg/dL (ref 0.3–1.2)
Total Protein: 5.5 g/dL — ABNORMAL LOW (ref 6.5–8.1)

## 2020-11-23 LAB — CBC
HCT: 26.8 % — ABNORMAL LOW (ref 36.0–46.0)
Hemoglobin: 8.6 g/dL — ABNORMAL LOW (ref 12.0–15.0)
MCH: 28 pg (ref 26.0–34.0)
MCHC: 32.1 g/dL (ref 30.0–36.0)
MCV: 87.3 fL (ref 80.0–100.0)
Platelets: 175 10*3/uL (ref 150–400)
RBC: 3.07 MIL/uL — ABNORMAL LOW (ref 3.87–5.11)
RDW: 15.6 % — ABNORMAL HIGH (ref 11.5–15.5)
WBC: 6 10*3/uL (ref 4.0–10.5)
nRBC: 0 % (ref 0.0–0.2)

## 2020-11-23 MED ORDER — FAMOTIDINE 20 MG PO TABS
20.0000 mg | ORAL_TABLET | Freq: Two times a day (BID) | ORAL | Status: DC
  Administered 2020-11-23 – 2020-11-28 (×11): 20 mg via ORAL
  Filled 2020-11-23 (×11): qty 1

## 2020-11-23 MED ORDER — MIRTAZAPINE 15 MG PO TABS
15.0000 mg | ORAL_TABLET | Freq: Every day | ORAL | Status: DC
  Administered 2020-11-23 – 2020-11-27 (×5): 15 mg via ORAL
  Filled 2020-11-23 (×5): qty 1

## 2020-11-23 MED ORDER — TRAZODONE HCL 50 MG PO TABS
100.0000 mg | ORAL_TABLET | Freq: Every day | ORAL | Status: DC
  Administered 2020-11-23 – 2020-11-27 (×5): 100 mg via ORAL
  Filled 2020-11-23 (×5): qty 2

## 2020-11-23 MED ORDER — RIVAROXABAN 10 MG PO TABS
10.0000 mg | ORAL_TABLET | Freq: Every evening | ORAL | Status: DC
  Administered 2020-11-23 – 2020-11-27 (×5): 10 mg via ORAL
  Filled 2020-11-23 (×5): qty 1

## 2020-11-23 MED ORDER — DONEPEZIL HCL 5 MG PO TABS
10.0000 mg | ORAL_TABLET | Freq: Every day | ORAL | Status: DC
  Administered 2020-11-23 – 2020-11-27 (×5): 10 mg via ORAL
  Filled 2020-11-23 (×5): qty 2

## 2020-11-23 MED ORDER — GABAPENTIN 300 MG PO CAPS
300.0000 mg | ORAL_CAPSULE | Freq: Two times a day (BID) | ORAL | Status: DC
  Administered 2020-11-23 – 2020-11-28 (×11): 300 mg via ORAL
  Filled 2020-11-23 (×11): qty 1

## 2020-11-23 MED ORDER — ONDANSETRON HCL 4 MG/2ML IJ SOLN
4.0000 mg | Freq: Four times a day (QID) | INTRAMUSCULAR | Status: DC | PRN
  Administered 2020-11-25: 4 mg via INTRAVENOUS
  Filled 2020-11-23: qty 2

## 2020-11-23 MED ORDER — MORPHINE SULFATE (PF) 2 MG/ML IV SOLN
2.0000 mg | INTRAVENOUS | Status: DC | PRN
  Administered 2020-11-23 – 2020-11-27 (×8): 2 mg via INTRAVENOUS
  Filled 2020-11-23 (×9): qty 1

## 2020-11-23 MED ORDER — ONDANSETRON HCL 4 MG PO TABS
4.0000 mg | ORAL_TABLET | Freq: Four times a day (QID) | ORAL | Status: DC | PRN
  Administered 2020-11-26: 4 mg via ORAL
  Filled 2020-11-23: qty 1

## 2020-11-23 MED ORDER — SODIUM CHLORIDE 0.9 % IV SOLN: INTRAVENOUS | Status: DC

## 2020-11-23 MED ORDER — PANTOPRAZOLE SODIUM 40 MG IV SOLR
40.0000 mg | INTRAVENOUS | Status: DC
  Administered 2020-11-23 – 2020-11-25 (×3): 40 mg via INTRAVENOUS
  Filled 2020-11-23 (×3): qty 40

## 2020-11-23 MED ORDER — ACETAMINOPHEN 325 MG PO TABS
650.0000 mg | ORAL_TABLET | Freq: Four times a day (QID) | ORAL | Status: DC | PRN
  Filled 2020-11-23 (×2): qty 2

## 2020-11-23 MED ORDER — AMLODIPINE BESYLATE 5 MG PO TABS
2.5000 mg | ORAL_TABLET | Freq: Every day | ORAL | Status: DC
  Administered 2020-11-23 – 2020-11-28 (×6): 2.5 mg via ORAL
  Filled 2020-11-23 (×6): qty 1

## 2020-11-23 MED ORDER — ACETAMINOPHEN 650 MG RE SUPP: 650.0000 mg | Freq: Four times a day (QID) | RECTAL | Status: DC | PRN

## 2020-11-23 MED ORDER — CITALOPRAM HYDROBROMIDE 20 MG PO TABS
20.0000 mg | ORAL_TABLET | Freq: Every day | ORAL | Status: DC
  Administered 2020-11-23 – 2020-11-28 (×6): 20 mg via ORAL
  Filled 2020-11-23 (×6): qty 1

## 2020-11-23 NOTE — Progress Notes (Signed)
[facility]  Patient known to surgery. History of perforated peptic ulcer, graham patch, noncompliance and multiple admission for abdominal pain. Known cholelithiasis.  No signs of cholecystitis on imaging. Dr. Arnoldo Morale awaiting EUS of the pancreatic lesion before any plans for cholecystectomy.   Will see patient tomorrow and do full consult. No plans for any cholecystectomy at this time.  Curlene Labrum, MD [facility] [address] [place], [place] [address] [phone] (office)

## 2020-11-23 NOTE — Progress Notes (Addendum)
PROGRESS NOTE    Patient: Maria Murphy                            PCP: Ladell Pier, MD                    DOB: Nov 28, 1955            DOA: 11/22/2020 XBM:[id number]             DOS: 11/23/2020, 10:29 AM   LOS: 0 days   Date of Service: The patient was seen and examined on 11/23/2020  Subjective:   The patient was seen and examined this morning. Stable at this time. Still complaining of : Abdominal pain especially with deep palpation Denies of having any nausea vomiting diarrhea or bleeding.   Brief Narrative:  Maria Murphy  is a 65 y.o. female, with history of PE, PUD, HTN, GERD, and more presents to the ED with a chief complaint of abdominal pain. The pain started at 5pm 11/22/20 in her right lower quadrant. Patient reports that she drank 8oz of wate  r at once, and that is when the pain started. At first it was 10/10. Now it is 5/10. She reports that she had 5-6 episodes of vomiting as well. Her last normal BM was the same day the pain started.She reports that the pain was constant and sharp until she received pain medications in the ED. did feel like her normal abdominal pain.  She felt associated flushing as well.  She denies any fever or chills.  She denies dysuria, hematemesis, hematochezia, and melena.  Patient has no other complaints at this time.  Does not smoke, drinks wine daily with meals, and does not use illicit drugs.  Patient is full code.  Patient was last seen by GI earlier this month for hospital follow-up.  They noted that she has a history of peptic ulcer disease that was complicated by perforation requiring a Phillip Heal patch.  She is also had a chronic pancreatic lesion that they mention is concerning for malignancy.  She complained of abdominal pain at that visit as well, which they attribute to known ulcers.  They have advised her to strictly avoid NSAIDs.  She is advised to keep her appointment with Dr. Rush Landmark regarding her lesion of her pancreas, and to  follow-up with GI in 2 months.  In the ED Temp 98.7, heart rate 100, respiratory rate 22, blood pressure 164/105, satting 100% No leukocytosis with a white blood cell count of 8.9, hemoglobin 9.8 Chemistry panel reveals a hypokalemia at 3.4 CT abdomen shows negative for acute appendicitis, gallstones.  No biliary dilatation.  Stable 4.1 cm cystic lesion and calcification in the head of the pancreas.  Lastly fibroid uterus.  Patient was given a 500 bolus in the ED, Zofran, Protonix, Dilaudid    Assessment & Plan:   Principal Problem:   Abdominal pain Active Problems:   Essential hypertension   GERD (gastroesophageal reflux disease)   Hypokalemia  Abdominal pain Abdominal pain with history of nonobstructive cholelithiasis, peptic ulcer disease-chronic -GI consulted, pending evaluation- -Remain n.p.o. -Continuing IV fluids -As needed analgesics, morphine -As needed antiemetics -CT abdomen pelvis reviewed: Never any pancreatitis, gallstones with no obstruction, 4.1 cm cystic lesion at the head of pancreas, fibroid uterus -Appreciate GI input -We will continue to monitor  Cholelithiasis, - nonobstructive, monitoring  Hypokalemia -monitoring and repleting, checking magnesium  Hypertension-stable, will continue home medication of  amlodipine  History of pulmonary embolism-on Xarelto, will continue at this time  GERD -currently on IV Protonix 40 daily  Depression-stable continue mirtazapine, Neurontin, Celexa,    ----------------------------------------------------------------------------------------------------------------------------------------- Nutritional status:  The patient's BMI is: Body mass index is 30.04 kg/m. I agree with the assessment and plan as outlined ----------------------------------------------------------------------------------------------------------------------------------------- Cultures; none Antimicrobials: None    Consultants:  Gastroenterologist   ------------------------------------------------------------------------------------------------------------------------------------------------  DVT prophylaxis:  SCD/Compression stockings and HE:[id number]  Code Status:   Code Status: Full Code  Family Communication: Cussed with patient  No family member present at bedside- The above findings and plan of care has been discussed with patient  in detail,  they expressed understanding and agreement of above. -Advance care planning has been discussed.   Admission status:   Status is: Observation  The patient remains OBS appropriate and will d/c before 2 midnights.  Dispo: The patient is from: Home              Anticipated d/c is to: Home in 1-2 days               Patient currently is not medically stable to d/c.   Difficult to place patient No      Level of care: Telemetry   Procedures:   No admission procedures for hospital encounter.     Antimicrobials:  Anti-infectives (From admission, onward)   None       Medication:  . amLODipine  2.5 mg Oral Daily  . citalopram  20 mg Oral Daily  . donepezil  10 mg Oral QHS  . famotidine  20 mg Oral BID  . gabapentin  300 mg Oral BID  . mirtazapine  15 mg Oral QHS  . pantoprazole (PROTONIX) IV  40 mg Intravenous Q24H  . rivaroxaban  10 mg Oral QPM  . traZODone  100 mg Oral QHS    acetaminophen **OR** acetaminophen, morphine injection, ondansetron **OR** ondansetron (ZOFRAN) IV   Objective:   Vitals:   11/23/20 0110 11/23/20 0215 11/23/20 0443 11/23/20 1002  BP: (!) 163/91 (!) 149/75 136/75 134/80  Pulse: 70 (!) 59 72   Resp: 18 16 19    Temp: 98.7 F (37.1 C) 98.2 F (36.8 C) 97.6 F (36.4 C)   TempSrc: Oral     SpO2: 95% 97% 94%   Weight:      Height:        Intake/Output Summary (Last 24 hours) at 11/23/2020 1029 Last data filed at 11/23/2020 0156 Gross per 24 hour  Intake 562.37 ml  Output --  Net 562.37 ml   Filed Weights    11/22/20 2204  Weight: 79.4 kg     Examination:   Physical Exam  Constitution:  Alert, cooperative, no distress,  Appears calm and comfortable  Psychiatric: Normal and stable mood and affect, cognition intact,   HEENT: Normocephalic, PERRL, otherwise with in Normal limits  Chest:Chest symmetric Cardio vascular:  S1/S2, RRR, No murmure, No Rubs or Gallops  pulmonary: Clear to auscultation bilaterally, respirations unlabored, negative wheezes / crackles Abdomen: Soft, epigastric tenderness with deep palpation, negative for rebound tenderness, non-distended, bowel sounds,no masses, no organomegaly Muscular skeletal: Limited exam - in bed, able to move all 4 extremities, Normal strength,  Neuro: CNII-XII intact. , normal motor and sensation, reflexes intact  Extremities: No pitting edema lower extremities, +2 pulses  Skin: Dry, warm to touch, negative for any Rashes, No open wounds Wounds: per nursing documentation    ------------------------------------------------------------------------------------------------------------------------------------------    LABs:  CBC Latest Ref Rng &  Units 11/23/2020 11/22/2020 11/17/2020  WBC 4.0 - 10.5 K/uL 6.0 8.9 4.1  Hemoglobin 12.0 - 15.0 g/dL 8.6(L) 9.8(L) 9.1(L)  Hematocrit 36.0 - 46.0 % 26.8(L) 30.8(L) 29.0(L)  Platelets 150 - 400 K/uL 175 214 188   CMP Latest Ref Rng & Units 11/23/2020 11/22/2020 11/18/2020  Glucose 70 - 99 mg/dL 98 125(H) 91  BUN 8 - 23 mg/dL 10 13 8   Creatinine 0.44 - 1.00 mg/dL 0.61 0.75 0.66  Sodium 135 - 145 mmol/L 140 137 139  Potassium 3.5 - 5.1 mmol/L 3.3(L) 3.4(L) 3.3(L)  Chloride 98 - 111 mmol/L 108 105 105  CO2 22 - 32 mmol/L 23 22 24   Calcium 8.9 - 10.3 mg/dL 8.2(L) 8.9 8.7(L)  Total Protein 6.5 - 8.1 g/dL 5.5(L) 7.3 -  Total Bilirubin 0.3 - 1.2 mg/dL 0.4 0.4 -  Alkaline Phos 38 - 126 U/L 39 51 -  AST 15 - 41 U/L 13(L) 22 -  ALT 0 - 44 U/L 7 8 -       Micro Results Recent Results (from the past 240  hour(s))  SARS CORONAVIRUS 2 (TAT 6-24 HRS) Nasopharyngeal Nasopharyngeal Swab     Status: None   Collection Time: 11/15/20  4:55 AM   Specimen: Nasopharyngeal Swab  Result Value Ref Range Status   SARS Coronavirus 2 NEGATIVE NEGATIVE Final    Comment: (NOTE) SARS-CoV-2 target nucleic acids are NOT DETECTED.  The SARS-CoV-2 RNA is generally detectable in upper and lower respiratory specimens during the acute phase of infection. Negative results do not preclude SARS-CoV-2 infection, do not rule out co-infections with other pathogens, and should not be used as the sole basis for treatment or other patient management decisions. Negative results must be combined with clinical observations, patient history, and epidemiological information. The expected result is Negative.  Fact Sheet for Patients: SugarRoll.be  Fact Sheet for Healthcare Providers: https://www.woods-mathews.com/  This test is not yet approved or cleared by the [place] FDA and  has been authorized for detection and/or diagnosis of SARS-CoV-2 by FDA under an Emergency Use Authorization (EUA). This EUA will remain  in effect (meaning this test can be used) for the duration of the COVID-19 declaration under Se ction 564(b)(1) of the Act, 21 U.S.C. section 360bbb-3(b)(1), unless the authorization is terminated or revoked sooner.  Performed at [facility], [place] [address], [place], [place] [address]   Culture, Urine     Status: Abnormal   Collection Time: 11/15/20  3:00 PM   Specimen: Urine, Clean Catch  Result Value Ref Range Status   Specimen Description   Final    URINE, CLEAN CATCH Performed at [facility], [address] [place], [place] [address]    Special Requests   Final    NONE Performed at [facility], [address], [place], [place] [address]    Culture (A)  Final    40,000 COLONIES/mL LACTOBACILLUS SPECIES Standardized susceptibility testing for  this organism is not available. Performed at [facility], [place] [address] [place], [place] [address]    Report Status 11/17/2020 FINAL  Final    Radiology Reports CT ABDOMEN PELVIS WO CONTRAST  Result Date: 11/22/2020 CLINICAL DATA:  Right lower quadrant pain EXAM: CT ABDOMEN AND PELVIS WITHOUT CONTRAST TECHNIQUE: Multidetector CT imaging of the abdomen and pelvis was performed following the standard protocol without IV contrast. COMPARISON:  Ultrasound 01/15/2021, CT 11/14/2020, 11/10/2020, 04/20/2020 FINDINGS: Lower chest: Lung bases demonstrate no acute consolidation or effusion. Linear atelectasis or  scarring at the lingula. Mild cardiomegaly. Small hiatal hernia. Hepatobiliary: Stable subcentimeter hypodensity in the liver, too small to further characterize. Multiple calcified gallstones. No biliary dilatation. Pancreas: No definite acute inflammatory change. Calcifications at the head of pancreas. Cystic lesion measuring 4.1 by 2.9 cm, previously 4.2 cm. Spleen: Normal in size without focal abnormality. Adrenals/Urinary Tract: Adrenal glands within normal limits. Cysts within the kidneys. Rim calcified cyst exophytic upper pole right kidney without change. No hydronephrosis. The bladder is normal Stomach/Bowel: Stomach is within normal limits. Appendix appears normal. No evidence of bowel wall thickening, distention, or inflammatory changes. Vascular/Lymphatic: Nonaneurysmal aorta. Mild aortic atherosclerosis. No suspicious nodes Reproductive: Multiple uterine masses consistent with fibroids. Other: Negative for free air or free fluid Musculoskeletal: Grade 1 anterolisthesis L4 on L5. Degenerative changes of the lumbar spine. No acute osseous abnormality IMPRESSION: 1. Negative for acute appendicitis. 2. Gallstones. No biliary dilatation. 3. Stable 4.1 cm cystic lesion and calcification within the head of the pancreas. 4. Fibroid uterus. Aortic Atherosclerosis (ICD10-I70.0). Electronically  Signed   By: Donavan Foil M.D.   On: 11/22/2020 23:17   CT Angio Chest PE W and/or Wo Contrast  Result Date: 11/10/2020 CLINICAL DATA:  65 year old female with right rib and flank pain. History of PE in September 2021. Abdominal pain with black tarry stools. History of cholelithiasis. History of exploratory laparotomy, perforated gastric ulcer in August 2021. EXAM: CT ANGIOGRAPHY CHEST CT ABDOMEN AND PELVIS WITH CONTRAST TECHNIQUE: Multidetector CT imaging of the chest was performed using the standard protocol during bolus administration of intravenous contrast. Multiplanar CT image reconstructions and MIPs were obtained to evaluate the vascular anatomy. Multidetector CT imaging of the abdomen and pelvis was performed using the standard protocol during bolus administration of intravenous contrast. CONTRAST:  135mL OMNIPAQUE IOHEXOL 350 MG/ML SOLN COMPARISON:  CT Abdomen and Pelvis 10/15/2020 and earlier. CTA chest 05/01/2020. FINDINGS: CTA CHEST FINDINGS Cardiovascular: Good contrast bolus timing in the pulmonary arterial tree. No focal filling defect identified in the pulmonary arteries today to suggest acute or residual pulmonary embolism. Minimal calcified coronary artery atherosclerosis is evident. Mild cardiomegaly. No pericardial effusion. Minimal plaque of the thoracic aorta. Mediastinum/Nodes: Negative.  No lymphadenopathy. Lungs/Pleura: Somewhat lower lung volumes compared to last year. Resolved pleural effusions and lower lobe collapse or consolidation. Mild mosaic attenuation and mild peribronchial streaky opacity in the lingula, favor a combination of atelectasis and mild gas trapping. Major airways are patent. No areas suspicious for acute respiratory infection. Musculoskeletal: Chronic severe T10-T11 disc and endplate degeneration. Chronic severe degeneration at the visible shoulders. No acute osseous abnormality identified. Review of the MIP images confirms the above findings. CT ABDOMEN and  PELVIS FINDINGS Hepatobiliary: Chronic gallstones individually up to 16 mm. Continued indistinct appearance of the porta hepatis which appears mildly inflamed. But at the gallbladder fundus no wall thickening or pericholecystic inflammation is evident. No hyperenhancement at the gallbladder fossa. Stable liver parenchyma including tiny benign appearing low-density area at the dome. No bile duct enlargement. Pancreas: Abnormal partially calcified multi cystic appearing pancreatic head mass as described on the CT last month appears stable measuring 4.3 cm on series 4, image 37. No main pancreatic ductal dilatation. No superimposed acute pancreatitis suspected. However, along with the indistinct porta hepatis the gastric antrum and proximal duodenum continue to appear indistinct and inflamed. See coronal image 35 today. Compared to last month, the distal stomach and proximal duodenum appear mildly improved. No extraluminal gas or fluid identified. Proximal stomach and distal duodenum appear within  normal limits. Spleen: Negative. Adrenals/Urinary Tract: Adrenal glands remain normal. Stable kidneys with bilateral parapelvic and exophytic renal cystic lesions which appeared benign by MRI in August of last year. Symmetric renal contrast excretion with no hydronephrosis or hydroureter. Diminutive and unremarkable urinary bladder. Stable pelvic phleboliths. Stomach/Bowel: Sigmoid diverticulosis again noted. No active inflammation identified. Otherwise redundant large bowel. Retained stool in the right colon and at the hepatic flexure. Normal appendix visible on series 4, image 60. Decompressed and negative terminal ileum. No dilated small bowel. Stomach and duodenum described above. No free air. No free fluid. Vascular/Lymphatic: Major arterial structures remain patent with tortuosity but minimal atherosclerosis. Portal venous system is patent. No lymphadenopathy. Reproductive: Stable lobulated fibroid uterus. Other: No  pelvic free fluid. Musculoskeletal: Stable advanced lumbar disc and facet degeneration. No acute osseous abnormality identified. Review of the MIP images confirms the above findings. IMPRESSION: CHEST: 1. Negative for acute or residual pulmonary embolus. 2. Mild scattered pulmonary atelectasis and gas trapping. No other acute process in the chest. ABDOMEN AND PELVIS: 1. Largely stable CT appearance of the abdomen and pelvis since 10/15/2020 (please see also that report): 2. Stable cystic and partially calcified pancreatic head mass, 4.3 cm. 3. Slightly improved appearance of the gastric antrum, duodenal bulb, and porta hepatis from last month. As before this may reflect improving peptic ulcer disease. Underlying chronic cholelithiasis, without strong evidence of acute cholecystitis today. 4. Fibroid uterus. Multiple renal cysts which had a benign MRI appearance last yea. Sigmoid diverticulosis. Electronically Signed   By: Genevie Ann M.D.   On: 11/10/2020 06:10   CT ABDOMEN PELVIS W CONTRAST  Result Date: 11/10/2020 CLINICAL DATA:  65 year old female with right rib and flank pain. History of PE in September 2021. Abdominal pain with black tarry stools. History of cholelithiasis. History of exploratory laparotomy, perforated gastric ulcer in August 2021. EXAM: CT ANGIOGRAPHY CHEST CT ABDOMEN AND PELVIS WITH CONTRAST TECHNIQUE: Multidetector CT imaging of the chest was performed using the standard protocol during bolus administration of intravenous contrast. Multiplanar CT image reconstructions and MIPs were obtained to evaluate the vascular anatomy. Multidetector CT imaging of the abdomen and pelvis was performed using the standard protocol during bolus administration of intravenous contrast. CONTRAST:  11mL OMNIPAQUE IOHEXOL 350 MG/ML SOLN COMPARISON:  CT Abdomen and Pelvis 10/15/2020 and earlier. CTA chest 05/01/2020. FINDINGS: CTA CHEST FINDINGS Cardiovascular: Good contrast bolus timing in the pulmonary arterial  tree. No focal filling defect identified in the pulmonary arteries today to suggest acute or residual pulmonary embolism. Minimal calcified coronary artery atherosclerosis is evident. Mild cardiomegaly. No pericardial effusion. Minimal plaque of the thoracic aorta. Mediastinum/Nodes: Negative.  No lymphadenopathy. Lungs/Pleura: Somewhat lower lung volumes compared to last year. Resolved pleural effusions and lower lobe collapse or consolidation. Mild mosaic attenuation and mild peribronchial streaky opacity in the lingula, favor a combination of atelectasis and mild gas trapping. Major airways are patent. No areas suspicious for acute respiratory infection. Musculoskeletal: Chronic severe T10-T11 disc and endplate degeneration. Chronic severe degeneration at the visible shoulders. No acute osseous abnormality identified. Review of the MIP images confirms the above findings. CT ABDOMEN and PELVIS FINDINGS Hepatobiliary: Chronic gallstones individually up to 16 mm. Continued indistinct appearance of the porta hepatis which appears mildly inflamed. But at the gallbladder fundus no wall thickening or pericholecystic inflammation is evident. No hyperenhancement at the gallbladder fossa. Stable liver parenchyma including tiny benign appearing low-density area at the dome. No bile duct enlargement. Pancreas: Abnormal partially calcified multi cystic appearing pancreatic  head mass as described on the CT last month appears stable measuring 4.3 cm on series 4, image 37. No main pancreatic ductal dilatation. No superimposed acute pancreatitis suspected. However, along with the indistinct porta hepatis the gastric antrum and proximal duodenum continue to appear indistinct and inflamed. See coronal image 35 today. Compared to last month, the distal stomach and proximal duodenum appear mildly improved. No extraluminal gas or fluid identified. Proximal stomach and distal duodenum appear within normal limits. Spleen: Negative.  Adrenals/Urinary Tract: Adrenal glands remain normal. Stable kidneys with bilateral parapelvic and exophytic renal cystic lesions which appeared benign by MRI in August of last year. Symmetric renal contrast excretion with no hydronephrosis or hydroureter. Diminutive and unremarkable urinary bladder. Stable pelvic phleboliths. Stomach/Bowel: Sigmoid diverticulosis again noted. No active inflammation identified. Otherwise redundant large bowel. Retained stool in the right colon and at the hepatic flexure. Normal appendix visible on series 4, image 60. Decompressed and negative terminal ileum. No dilated small bowel. Stomach and duodenum described above. No free air. No free fluid. Vascular/Lymphatic: Major arterial structures remain patent with tortuosity but minimal atherosclerosis. Portal venous system is patent. No lymphadenopathy. Reproductive: Stable lobulated fibroid uterus. Other: No pelvic free fluid. Musculoskeletal: Stable advanced lumbar disc and facet degeneration. No acute osseous abnormality identified. Review of the MIP images confirms the above findings. IMPRESSION: CHEST: 1. Negative for acute or residual pulmonary embolus. 2. Mild scattered pulmonary atelectasis and gas trapping. No other acute process in the chest. ABDOMEN AND PELVIS: 1. Largely stable CT appearance of the abdomen and pelvis since 10/15/2020 (please see also that report): 2. Stable cystic and partially calcified pancreatic head mass, 4.3 cm. 3. Slightly improved appearance of the gastric antrum, duodenal bulb, and porta hepatis from last month. As before this may reflect improving peptic ulcer disease. Underlying chronic cholelithiasis, without strong evidence of acute cholecystitis today. 4. Fibroid uterus. Multiple renal cysts which had a benign MRI appearance last yea. Sigmoid diverticulosis. Electronically Signed   By: Genevie Ann M.D.   On: 11/10/2020 06:10   US Abdomen Limited  Result Date: 11/15/2020 CLINICAL DATA:  Right  upper quadrant abdominal pain. EXAM: ULTRASOUND ABDOMEN LIMITED RIGHT UPPER QUADRANT COMPARISON:  November 14, 2020.  October 18, 2020. FINDINGS: Gallbladder: Cholelithiasis is noted as well as some degree of sludge within the gallbladder lumen. No gallbladder wall thickening or pericholecystic fluid is noted. The absence or presence of sonographic Murphy's sign was not reported by the technologist. Common bile duct: Diameter: 2 mm which is within normal limits. Liver: No focal lesion identified. Within normal limits in parenchymal echogenicity. Portal vein is patent on color Doppler imaging with normal direction of blood flow towards the liver. Other: None. IMPRESSION: Cholelithiasis and gallbladder sludge is noted without gallbladder wall thickening or pericholecystic fluid. Electronically Signed   By: Marijo Conception M.D.   On: 11/15/2020 09:00   DG ABD ACUTE 2+V W 1V CHEST  Result Date: 11/11/2020 CLINICAL DATA:  64 year old female with persistent abdominal pain and vomiting. EXAM: DG ABDOMEN ACUTE WITH 1 VIEW CHEST COMPARISON:  CTA chest and CT Abdomen and Pelvis 11/10/2020, and earlier. FINDINGS: Portable AP upright view of the chest at 0724 hours. Lower lung volumes. No acute pulmonary opacity. Stable cardiac size and mediastinal contours. No pneumoperitoneum. Portable upright and supine views of the abdomen 0726 hours. Non obstructed bowel gas pattern. Cholelithiasis again noted. Pelvic vascular calcifications. No acute osseous abnormality identified. IMPRESSION: 1. Normal bowel gas pattern, no free air. Cholelithiasis again  noted. 2. Lower lung volumes with no acute cardiopulmonary abnormality. Electronically Signed   By: Genevie Ann M.D.   On: 11/11/2020 07:54   DG Abdomen Acute W/Chest  Result Date: 11/01/2020 CLINICAL DATA:  Abdominal pain with black, tarry stools. EXAM: DG ABDOMEN ACUTE WITH 1 VIEW CHEST COMPARISON:  October 18, 2020 FINDINGS: There is no evidence of dilated bowel loops or free  intraperitoneal air. No radiopaque calculi are seen. A stable 1.1 cm soft tissue calcification is seen overlying the medial aspect of the mid to upper right abdomen. Heart size and mediastinal contours are within normal limits. Chronic appearing increased lung markings are seen without evidence of acute infiltrate. IMPRESSION: 1. No evidence of bowel obstruction or acute cardiopulmonary disease. 2. Stable soft tissue calcification within the abdomen which corresponds to the partially calcified pancreatic mass seen on prior studies. Electronically Signed   By: Virgina Norfolk M.D.   On: 11/01/2020 23:55   MM DIAG BREAST TOMO BILATERAL  Result Date: 11/12/2020 CLINICAL DATA:  65 year old female presenting for annual bilateral mammogram and 1 year follow-up of probably benign right breast calcifications. EXAM: DIGITAL DIAGNOSTIC BILATERAL MAMMOGRAM WITH TOMOSYNTHESIS AND CAD TECHNIQUE: Bilateral digital diagnostic mammography and breast tomosynthesis was performed. The images were evaluated with computer-aided detection. COMPARISON:  Previous exam(s). ACR Breast Density Category b: There are scattered areas of fibroglandular density. FINDINGS: A 9 mm group of coarse calcifications in the upper-outer right breast are mammographically stable. No new or suspicious mammographic findings are identified in the remainder of either breast. IMPRESSION: 1. Stable, probably benign right breast calcifications. Recommendation is for a final mammographic follow-up in 1 year. 2. No mammographic evidence of malignancy on the left. RECOMMENDATION: Bilateral diagnostic mammogram in 1 year. I have discussed the findings and recommendations with the patient. If applicable, a reminder letter will be sent to the patient regarding the next appointment. BI-RADS CATEGORY  3: Probably benign. Electronically Signed   By: Kristopher Oppenheim M.D.   On: 11/12/2020 14:23   CT Renal Stone Study  Result Date: 11/14/2020 CLINICAL DATA:  Right  flank pain EXAM: CT ABDOMEN AND PELVIS WITHOUT CONTRAST TECHNIQUE: Multidetector CT imaging of the abdomen and pelvis was performed following the standard protocol without IV contrast. COMPARISON:  11/10/2020 FINDINGS: Lower chest: Scarring in the lingula. Small hiatal hernia. No effusions. Hepatobiliary: Multiple gallstones within the gallbladder measuring up 2 17 mm. Small hypodensity in the right hepatic dome compatible with cyst. Pancreas: Partially calcified cystic mass again seen within the pancreatic head measures 4.2 cm, stable. No pancreatic ductal dilatation. Spleen: No focal abnormality.  Normal size. Adrenals/Urinary Tract: Multiple cystic lesions within the kidneys bilaterally. No hydronephrosis. Adrenal glands and urinary bladder unremarkable. Stomach/Bowel: Large stool burden throughout the colon. Normal appendix. Stomach is decompressed. Mildly prominent fluid-filled right abdominal small bowel loops. Terminal ileum is decompressed. Vascular/Lymphatic: No evidence of aneurysm or adenopathy. Reproductive: Fibroid uterus.  No adnexal mass. Other: No free fluid or free air. Musculoskeletal: No acute bony abnormality. IMPRESSION: Cholelithiasis. Stable partially calcified cystic mass within the pancreatic head. Stable bilateral renal cystic lesions. No renal or ureteral stones. No hydronephrosis. Large stool burden throughout the colon. Fibroid uterus. Electronically Signed   By: Rolm Baptise M.D.   On: 11/14/2020 23:55    SIGNED: Deatra James, MD, FHM. [facility],  Pager (please use [email or web address] to page/text) Please use Epic Secure Chat for non-urgent communication (7AM-7PM)  If 7PM-7AM, please contact night-coverage [email or web address], 11/23/2020, 10:29 AM

## 2020-11-23 NOTE — ED Notes (Signed)
Hospitalist at Pts bedside

## 2020-11-23 NOTE — H&P (Signed)
[facility] H&P    Patient Demographics:    Maria Murphy, is a 65 y.o. female  MRN: [id number]  DOB - 1956-02-04  Admit Date - 11/22/2020  Referring MD/NP/PA: Roderic Palau  Outpatient Primary MD for the patient is Ladell Pier, MD  Patient coming from: Home  Chief complaint- Abdominal pain   HPI:    Maria Murphy  is a 65 y.o. female, with history of PE, PUD, HTN, GERD, and more presents to the ED with a chief complaint of abdominal pain. The pain started at 5pm 11/22/20 in her right lower quadrant. Patient reports that she drank 8oz of water at once, and that is when the pain started. At first it was 10/10. Now it is 5/10. She reports that she had 5-6 episodes of vomiting as well. Her last normal BM was the same day the pain started.She reports that the pain was constant and sharp until she received pain medications in the ED. did feel like her normal abdominal pain.  She felt associated flushing as well.  She denies any fever or chills.  She denies dysuria, hematemesis, hematochezia, and melena.  Patient has no other complaints at this time.  Does not smoke, drinks wine daily with meals, and does not use illicit drugs.  Patient is full code.  Patient was last seen by GI earlier this month for hospital follow-up.  They noted that she has a history of peptic ulcer disease that was complicated by perforation requiring a Phillip Heal patch.  She is also had a chronic pancreatic lesion that they mention is concerning for malignancy.  She complained of abdominal pain at that visit as well, which they attribute to known ulcers.  They have advised her to strictly avoid NSAIDs.  She is advised to keep her appointment with Dr. Rush Landmark regarding her lesion of her pancreas, and to follow-up with GI in 2 months.  In the ED Temp 98.7, heart rate 100, respiratory rate 22, blood pressure 164/105, satting 100% No leukocytosis with a white  blood cell count of 8.9, hemoglobin 9.8 Chemistry panel reveals a hypokalemia at 3.4 CT abdomen shows negative for acute appendicitis, gallstones.  No biliary dilatation.  Stable 4.1 cm cystic lesion and calcification in the head of the pancreas.  Lastly fibroid uterus.  Patient was given a 500 bolus in the ED, Zofran, Protonix, Dilaudid Admission was requested after the ED provider spoke with GI was advised to bring patient in for GI to see in the a.m.    Review of systems:    In addition to the HPI above,  No Fever-chills, No Headache, No changes with Vision or hearing, No problems swallowing food or Liquids, No Chest pain, Cough or Shortness of Breath, Admits to abdominal pain, nausea, vomiting, bowel movements are regular, No Blood in stool or Urine, No dysuria, No new skin rashes or bruises, No new joints pains-aches,  No new weakness, tingling, numbness in any extremity, No recent weight gain or loss, No polyuria, polydypsia or polyphagia, No significant Mental Stressors.  All other systems reviewed and  are negative.    Past History of the following :    Past Medical History:  Diagnosis Date  . Collagen vascular disease ([place])   . Essential hypertension, benign 01/24/2015  . Foot ulcer ([place])   . GERD (gastroesophageal reflux disease)   . Hypertension   . Memory loss   . PUD (peptic ulcer disease)    with perforation s/p exploratory laparotomy  . Pulmonary embolus ([place]) 04/2020  . Skin cancer   . Vision abnormalities       Past Surgical History:  Procedure Laterality Date  . BIOPSY  11/20/2020   Procedure: BIOPSY;  Surgeon: Rush Landmark Telford Nab., MD;  Location: [facility];  Service: Gastroenterology;;  . CENTRAL LINE INSERTION Right 04/22/2020   Procedure: CENTRAL LINE INSERTION;  Surgeon: Virl Cagey, MD;  Location: [facility];  Service: General;  Laterality: Right;  . DIAGNOSTIC LAPAROSCOPY    . ESOPHAGOGASTRODUODENOSCOPY N/A 11/20/2020   Procedure:  ESOPHAGOGASTRODUODENOSCOPY (EGD);  Surgeon: Irving Copas., MD;  Location: [facility];  Service: Gastroenterology;  Laterality: N/A;  . ESOPHAGOGASTRODUODENOSCOPY (EGD) WITH PROPOFOL N/A 07/10/2020   Procedure: ESOPHAGOGASTRODUODENOSCOPY (EGD) WITH PROPOFOL;  Surgeon: Rogene Houston, MD;  [facility];  Service: Endoscopy;  Laterality: N/A;  . ESOPHAGOGASTRODUODENOSCOPY (EGD) WITH PROPOFOL N/A 10/18/2020   Procedure: ESOPHAGOGASTRODUODENOSCOPY (EGD) WITH PROPOFOL;  Surgeon: Harvel Quale, MD;  [facility];  Service: Gastroenterology;  Laterality: N/A;  . EUS N/A 11/20/2020   Procedure: UPPER ENDOSCOPIC ULTRASOUND (EUS) LINEAR;  Surgeon: Irving Copas., MD;  Location: [facility];  Service: Gastroenterology;  Laterality: N/A;  . GASTRORRHAPHY  04/22/2020   Procedure: GASTRORRHAPHY;  Surgeon: Virl Cagey, MD;  Location: [facility];  Service: General;;  . IR CATHETER TUBE CHANGE  05/09/2020  . LAPAROTOMY N/A 04/22/2020   Procedure: EXPLORATORY LAPAROTOMY;  Surgeon: Virl Cagey, MD;  Location: [facility];  Service: General;  Laterality: N/A;  . TONSILLECTOMY        Social History:      Social History   Tobacco Use  . Smoking status: Never Smoker  . Smokeless tobacco: Never Used  Substance Use Topics  . Alcohol use: Yes    Alcohol/week: 2.0 - 3.0 standard drinks    Types: 2 - 3 Glasses of wine per week       Family History :     Family History  Problem Relation Age of Onset  . High Cholesterol Mother   . Hypertension Mother   . Arthritis/Rheumatoid Mother   . Alcohol abuse Father   . Hypertension Maternal Grandmother   . Colon cancer Neg Hx   . Colon polyps Neg Hx   . Inflammatory bowel disease Neg Hx       Home Medications:   Prior to Admission medications   Medication Sig Start Date End Date Taking? Authorizing Provider  acetaminophen (TYLENOL) 500 MG tablet Take 1 tablet (500 mg total) by mouth every 6 (six)  hours as needed for mild pain or fever. Patient taking differently: Take 1,000 mg by mouth as needed for mild pain or fever. 09/27/20   Johnson, Clanford L, MD  amLODipine (NORVASC) 2.5 MG tablet Take 1 tablet (2.5 mg total) by mouth daily. For BP 11/03/20   Emokpae, Courage, MD  citalopram (CELEXA) 20 MG tablet Take 20 mg by mouth daily. 11/04/20   [provider]  dicyclomine (BENTYL) 10 MG capsule Take 10 mg by mouth 2 (two) times daily. 11/04/20   [provider]  donepezil (ARICEPT) 10 MG tablet Take 0.5-1 tablets (5-10 mg total) by mouth in the morning and at bedtime. Patient taking differently: Take 10 mg by mouth in the morning and at bedtime. 09/20/20   Ladell Pier, MD  famotidine (PEPCID) 20 MG tablet Take 1 tablet (20 mg total) by mouth 2 (two) times daily. 11/11/20   Sherwood Gambler, MD  ferrous sulfate 325 (65 FE) MG tablet Take 1 tablet (325 mg total) by mouth daily with breakfast. 11/03/20   Roxan Hockey, MD  gabapentin (NEURONTIN) 300 MG capsule Take 1 capsule (300 mg total) by mouth 2 (two) times daily. 10/08/20   Ladell Pier, MD  lactose free nutrition (BOOST) LIQD Take 237 mLs by mouth 2 (two) times daily.    [provider]  lipase/protease/amylase (CREON) 36000 UNITS CPEP capsule Take 2 capsules (72,000 Units total) by mouth 3 (three) times daily with meals. May also take 1 capsule (36,000 Units total) as needed (with snacks). 10/16/20   Erenest Rasher, PA-C  mirtazapine (REMERON) 15 MG tablet Take 1 tablet (15 mg total) by mouth at bedtime. 10/08/20   Ladell Pier, MD  ondansetron (ZOFRAN ODT) 8 MG disintegrating tablet Take 1 tablet (8 mg total) by mouth every 8 (eight) hours as needed for nausea or vomiting. 10/16/20   Erenest Rasher, PA-C  pantoprazole (PROTONIX) 40 MG tablet Take 1 tablet (40 mg total) by mouth 2 (two) times daily. 11/03/20 12/03/20  Roxan Hockey, MD  promethazine (PHENERGAN) 25 MG suppository Place 1 suppository  (25 mg total) rectally every 6 (six) hours as needed for nausea or vomiting. 11/11/20   Sherwood Gambler, MD  promethazine (PHENERGAN) 25 MG tablet Take 1 tablet (25 mg total) by mouth every 6 (six) hours as needed for nausea or vomiting. 11/11/20   Sherwood Gambler, MD  rivaroxaban (XARELTO) 10 MG TABS tablet Take 1 tablet (10 mg total) by mouth every evening. 11/08/20   Roxan Hockey, MD  sucralfate (CARAFATE) 1 GM/10ML suspension Take 10 mLs (1 g total) by mouth 4 (four) times daily -  with meals and at bedtime. 11/03/20   Roxan Hockey, MD  traZODone (DESYREL) 100 MG tablet Take 100 mg by mouth at bedtime. 11/04/20   [provider]     Allergies:     Allergies  Allergen Reactions  . Wheat Extract Swelling     Physical Exam:   Vitals  Blood pressure (!) 163/91, pulse 70, temperature 98.7 F (37.1 C), temperature source Oral, resp. rate 18, height 5\' 4"  (1.626 m), weight 79.4 kg, SpO2 95 %.  1.  General: Patient supine in bed, no acute distress  2. Psychiatric: Mood and behavior normal for situation, alert and oriented x3, cooperative with exam  3. Neurologic: Speech and language are normal, face is symmetric, moves all 4 extremities voluntarily, no acute deficit on limited exam  4. HEENMT:  Head is atraumatic, normocephalic, pupils reactive to light, neck is supple, trachea is midline, mucous membranes are moist  5. Respiratory : Lungs are clear to auscultation bilaterally without wheezing, rhonchi, rales, no clubbing, no cyanosis  6. Cardiovascular : Heart rate is normal at the time of my exam, rhythm is regular, no murmurs rubs or gallops, no peripheral edema  7. Gastrointestinal:  Abdomen is soft, tender in the periumbilical region, epigastrium, and right lower quadrant nondistended, bowel sounds active  8. Skin:  Skin is warm dry and intact without acute lesion on limited exam  9.Musculoskeletal:  No acute  deformities, no calf tenderness, peripheral  pulses palpated    Data Review:    CBC Recent Labs  Lab 11/16/20 0429 11/17/20 0600 11/22/20 2206  WBC 3.4* 4.1 8.9  HGB 9.2* 9.1* 9.8*  HCT 30.0* 29.0* 30.8*  PLT 198 188 214  MCV 88.8 87.9 87.3  MCH 27.2 27.6 27.8  MCHC 30.7 31.4 31.8  RDW 16.0* 15.8* 15.7*  LYMPHSABS  --   --  1.4  MONOABS  --   --  0.5  EOSABS  --   --  0.1  BASOSABS  --   --  0.0   ------------------------------------------------------------------------------------------------------------------  Results for orders placed or performed during the hospital encounter of 11/22/20 (from the past 48 hour(s))  CBC with Differential/Platelet     Status: Abnormal   Collection Time: 11/22/20 10:06 PM  Result Value Ref Range   WBC 8.9 4.0 - 10.5 K/uL   RBC 3.53 (L) 3.87 - 5.11 MIL/uL   Hemoglobin 9.8 (L) 12.0 - 15.0 g/dL   HCT 30.8 (L) 36.0 - 46.0 %   MCV 87.3 80.0 - 100.0 fL   MCH 27.8 26.0 - 34.0 pg   MCHC 31.8 30.0 - 36.0 g/dL   RDW 15.7 (H) 11.5 - 15.5 %   Platelets 214 150 - 400 K/uL   nRBC 0.0 0.0 - 0.2 %   Neutrophils Relative % 78 %   Neutro Abs 6.9 1.7 - 7.7 K/uL   Lymphocytes Relative 15 %   Lymphs Abs 1.4 0.7 - 4.0 K/uL   Monocytes Relative 6 %   Monocytes Absolute 0.5 0.1 - 1.0 K/uL   Eosinophils Relative 1 %   Eosinophils Absolute 0.1 0.0 - 0.5 K/uL   Basophils Relative 0 %   Basophils Absolute 0.0 0.0 - 0.1 K/uL   Immature Granulocytes 0 %   Abs Immature Granulocytes 0.03 0.00 - 0.07 K/uL    Comment: Performed at [facility], [address], [place], [place] [address]  Comprehensive metabolic panel     Status: Abnormal   Collection Time: 11/22/20 10:06 PM  Result Value Ref Range   Sodium 137 135 - 145 mmol/L   Potassium 3.4 (L) 3.5 - 5.1 mmol/L   Chloride 105 98 - 111 mmol/L   CO2 22 22 - 32 mmol/L   Glucose, Bld 125 (H) 70 - 99 mg/dL    Comment: Glucose reference range applies only to samples taken after fasting for at least 8 hours.   BUN 13 8 - 23 mg/dL   Creatinine, Ser 0.75  0.44 - 1.00 mg/dL   Calcium 8.9 8.9 - 10.3 mg/dL   Total Protein 7.3 6.5 - 8.1 g/dL   Albumin 4.0 3.5 - 5.0 g/dL   AST 22 15 - 41 U/L   ALT 8 0 - 44 U/L   Alkaline Phosphatase 51 38 - 126 U/L   Total Bilirubin 0.4 0.3 - 1.2 mg/dL   GFR, Estimated >60 >60 mL/min    Comment: (NOTE) Calculated using the CKD-EPI Creatinine Equation (2021)    Anion gap 10 5 - 15    Comment: Performed at [facility], [address] [place], [place] [address]  Lipase, blood     Status: None   Collection Time: 11/22/20 10:06 PM  Result Value Ref Range   Lipase 31 11 - 51 U/L    Comment: Performed at [facility], [address] [place], [place] [address]    [place]  Lab 11/16/20 [phone] 11/17/20 0600 11/18/20 641 150 8580  11/22/20 2206  NA 141 138 139 137  K 3.4* 3.3* 3.3* 3.4*  CL 107 106 105 105  CO2 25 24 24 22   GLUCOSE 94 93 91 125*  BUN 7* 10 8 13   CREATININE 0.61 0.73 0.66 0.75  CALCIUM 8.5* 8.4* 8.7* 8.9  MG  --  2.0  --   --   AST 13*  --   --  22  ALT 7  --   --  8  ALKPHOS 38  --   --  51  BILITOT 0.5  --   --  0.4   ------------------------------------------------------------------------------------------------------------------  ------------------------------------------------------------------------------------------------------------------ GFR: Estimated Creatinine Clearance: 72.5 mL/min (by C-G formula based on SCr of 0.75 mg/dL). Liver Function Tests: Recent Labs  Lab 11/16/20 0429 11/22/20 2206  AST 13* 22  ALT 7 8  ALKPHOS 38 51  BILITOT 0.5 0.4  PROT 5.6* 7.3  ALBUMIN 3.1* 4.0   Recent Labs  Lab 11/22/20 2206  LIPASE 31   No results for input(s): AMMONIA in the last 168 hours. Coagulation Profile: Recent Labs  Lab 11/16/20 0429  INR 1.1   Cardiac Enzymes: No results for input(s): CKTOTAL, CKMB, CKMBINDEX, TROPONINI in the last 168 hours. BNP (last 3 results) No results for input(s): PROBNP in the last 8760 hours. HbA1C: No results for  input(s): HGBA1C in the last 72 hours. CBG: No results for input(s): GLUCAP in the last 168 hours. Lipid Profile: No results for input(s): CHOL, HDL, LDLCALC, TRIG, CHOLHDL, LDLDIRECT in the last 72 hours. Thyroid Function Tests: No results for input(s): TSH, T4TOTAL, FREET4, T3FREE, THYROIDAB in the last 72 hours. Anemia Panel: No results for input(s): VITAMINB12, FOLATE, FERRITIN, TIBC, IRON, RETICCTPCT in the last 72 hours.  --------------------------------------------------------------------------------------------------------------- Urine analysis:    Component Value Date/Time   COLORURINE STRAW (A) 11/15/2020 1500   APPEARANCEUR CLEAR 11/15/2020 1500   LABSPEC 1.002 (L) 11/15/2020 1500   PHURINE 7.0 11/15/2020 1500   GLUCOSEU NEGATIVE 11/15/2020 1500   HGBUR NEGATIVE 11/15/2020 1500   BILIRUBINUR NEGATIVE 11/15/2020 1500   KETONESUR NEGATIVE 11/15/2020 1500   PROTEINUR NEGATIVE 11/15/2020 1500   NITRITE NEGATIVE 11/15/2020 1500   LEUKOCYTESUR LARGE (A) 11/15/2020 1500      Imaging Results:    CT ABDOMEN PELVIS WO CONTRAST  Result Date: 11/22/2020 CLINICAL DATA:  Right lower quadrant pain EXAM: CT ABDOMEN AND PELVIS WITHOUT CONTRAST TECHNIQUE: Multidetector CT imaging of the abdomen and pelvis was performed following the standard protocol without IV contrast. COMPARISON:  Ultrasound 01/15/2021, CT 11/14/2020, 11/10/2020, 04/20/2020 FINDINGS: Lower chest: Lung bases demonstrate no acute consolidation or effusion. Linear atelectasis or scarring at the lingula. Mild cardiomegaly. Small hiatal hernia. Hepatobiliary: Stable subcentimeter hypodensity in the liver, too small to further characterize. Multiple calcified gallstones. No biliary dilatation. Pancreas: No definite acute inflammatory change. Calcifications at the head of pancreas. Cystic lesion measuring 4.1 by 2.9 cm, previously 4.2 cm. Spleen: Normal in size without focal abnormality. Adrenals/Urinary Tract: Adrenal glands  within normal limits. Cysts within the kidneys. Rim calcified cyst exophytic upper pole right kidney without change. No hydronephrosis. The bladder is normal Stomach/Bowel: Stomach is within normal limits. Appendix appears normal. No evidence of bowel wall thickening, distention, or inflammatory changes. Vascular/Lymphatic: Nonaneurysmal aorta. Mild aortic atherosclerosis. No suspicious nodes Reproductive: Multiple uterine masses consistent with fibroids. Other: Negative for free air or free fluid Musculoskeletal: Grade 1 anterolisthesis L4 on L5. Degenerative changes of the lumbar spine. No acute osseous abnormality IMPRESSION: 1. Negative for acute appendicitis. 2. Gallstones.  No biliary dilatation. 3. Stable 4.1 cm cystic lesion and calcification within the head of the pancreas. 4. Fibroid uterus. Aortic Atherosclerosis (ICD10-I70.0). Electronically Signed   By: Donavan Foil M.D.   On: 11/22/2020 23:17       Assessment & Plan:    Principal Problem:   Abdominal pain Active Problems:   Essential hypertension   GERD (gastroesophageal reflux disease)   Hypokalemia   1. Abdominal pain 1. Most likely secondary to chronic peptic ulcer disease 2. GI recommended admission for observation and they will see in the a.m. 3. N.p.o. 4. Maintenance fluids 5. Pain control with morphine 6. Zofran for nausea 7. CT without acute findings 8. Continue to monitor 2. Hypokalemia 1. Replace and recheck 3. Hypertension 1. Continue amlodipine 4. History of PE 1. Continue Xarelto 5. GERD 1. Continue Pepcid   DVT Prophylaxis-   Xarelto- SCDs  AM Labs Ordered, also please review Full Orders  Family Communication: No family at bedside  Code Status: Full  Admission status: Observation Time spent in minutes : [place] DO

## 2020-11-23 NOTE — Consult Note (Signed)
[name], M.D. 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  Patient is very well-known to our gastroenterology service.  Her last hospitalization was on 11/14/2020.  She has had frequent endoscopic evaluations for abdominal pain that she is currently presenting.  She reports that the symptoms got worse for the last week which she characterizes severe right upper quadrant epigastric abdominal pain along with episodes of nausea and some vomiting but no presence of melena or hematochezia.  In the ED, the patient was presenting hemodynamic stability without fever.  Labs showed hemoglobin at baseline of 9.8.  MCV of 87.Marland Kitchen  Count and although cell count were within normal limits as well as electrolytes and renal function, liver function studies were completely normal.  Underwent a CT of the abdomen and pelvis without IV contrast which showed gallstones without biliary ductal dilation, along with stable 4.1 cm cystic lesion amplification in the head of the pancreas.  Notably, the patient was evaluated by Dr. Rush Landmark on 11/20/2020 for evaluation of pancreatic lesion.  There was presence of grade C esophagitis in the esophagus in the distal area, at 3 continue to high hernia was present, few small  (largest 8 mm) cratered ulcers were found in the gastric body and antrum with a clean ulcer base.  There was deformity of the stomach and the pylorus and the proximal duodenal bulb.  Biopsies were taken from the stomach to rule out H. pylori which came back -4 days.  There was presence of nonbleeding cratered duodenal ulcer with a clean base in the duodenal bulb extending up to 22 mm, there was presence of suture material.  There was presence of acquired stenosis due to the previous surgical intervention but the scope could be passed down.  Biopsies from the duodenal ulcer edges were obtained.  Endoscopic ultrasound no show any abnormality of the genu of the pancreas, pancreatic body and pancreatic tail however the evaluation of the cystic lesion located in the head of the pancreas could not be assessed thoroughly as a result of the large duodenal ulcer.  There were no malignant appearing lymph nodes in the celiac lesion.  Past Medical History: SEE CHRONIC ISSSUES: Past Medical History:  Diagnosis Date  . Collagen vascular disease ([place])   . Essential hypertension, benign 01/24/2015  . Foot ulcer ([place])   . GERD (gastroesophageal reflux disease)   . Hypertension   . Memory loss   . PUD (peptic ulcer disease)    with perforation s/p exploratory laparotomy  . Pulmonary embolus ([place]) 04/2020  . Skin cancer   . Vision abnormalities    Past Surgical History:  Past Surgical History:  Procedure Laterality Date  . BIOPSY  11/20/2020   Procedure: BIOPSY;  Surgeon: Rush Landmark Telford Nab., MD;  Location: [facility];  Service: Gastroenterology;;  . CENTRAL LINE INSERTION Right 04/22/2020   Procedure: CENTRAL LINE INSERTION;  Surgeon: Virl Cagey, MD;  Location: [facility];  Service: General;  Laterality: Right;  . DIAGNOSTIC LAPAROSCOPY    . ESOPHAGOGASTRODUODENOSCOPY N/A 11/20/2020   Procedure: ESOPHAGOGASTRODUODENOSCOPY (EGD);  Surgeon: Irving Copas., MD;  Location: [facility];  Service:  Gastroenterology;  Laterality: N/A;  . ESOPHAGOGASTRODUODENOSCOPY (EGD) WITH PROPOFOL N/A 07/10/2020   Procedure: ESOPHAGOGASTRODUODENOSCOPY (EGD) WITH PROPOFOL;  Surgeon: Rogene Houston, MD;  Location: [facility];  Service: Endoscopy;  Laterality: N/A;  . ESOPHAGOGASTRODUODENOSCOPY (EGD) WITH PROPOFOL N/A 10/18/2020   Procedure: ESOPHAGOGASTRODUODENOSCOPY (EGD) WITH PROPOFOL;  Surgeon: Harvel Quale, MD;  Location: [facility];  Service: Gastroenterology;  Laterality: N/A;  . EUS N/A 11/20/2020   Procedure: UPPER ENDOSCOPIC ULTRASOUND (EUS) LINEAR;  Surgeon: Irving Copas., MD;  Location: [facility];  Service: Gastroenterology;  Laterality: N/A;  . GASTRORRHAPHY  04/22/2020   Procedure: GASTRORRHAPHY;  Surgeon: Virl Cagey, MD;  Location: [facility];  Service: General;;  . IR CATHETER TUBE CHANGE  05/09/2020  . LAPAROTOMY N/A 04/22/2020   Procedure: EXPLORATORY LAPAROTOMY;  Surgeon: Virl Cagey, MD;  Location: [facility];  Service: General;  Laterality: N/A;  . TONSILLECTOMY     Family History:  Family History  Problem Relation Age of Onset  . High Cholesterol Mother   . Hypertension Mother   . Arthritis/Rheumatoid Mother   . Alcohol abuse Father   . Hypertension Maternal Grandmother   . Colon cancer Neg Hx   . Colon polyps Neg Hx   . Inflammatory bowel disease Neg Hx    Social History:  Social History   Tobacco Use  . Smoking status: Never Smoker  . Smokeless tobacco: Never Used  Vaping Use  . Vaping Use: Never used  Substance Use Topics  . Alcohol use: Yes    Alcohol/week: 2.0 - 3.0 standard drinks    Types: 2 - 3 Glasses of wine per week  . Drug use: No    Home Medications:  Prior to Admission medications   Medication Sig Start Date End Date Taking? Authorizing Provider  acetaminophen (TYLENOL) 500 MG tablet Take 1 tablet (500 mg total) by mouth every 6 (six) hours as needed for mild pain or fever. Patient taking differently: Take 1,000  mg by mouth as needed for mild pain or fever. 09/27/20   Johnson, Clanford L, MD  amLODipine (NORVASC) 2.5 MG tablet Take 1 tablet (2.5 mg total) by mouth daily. For BP 11/03/20   Emokpae, Courage, MD  citalopram (CELEXA) 20 MG tablet Take 20 mg by mouth daily. 11/04/20   [provider]  dicyclomine (BENTYL) 10 MG capsule Take 10 mg by mouth 2 (two) times daily. 11/04/20   [provider]  donepezil (ARICEPT) 10 MG tablet Take 0.5-1 tablets (5-10 mg total) by mouth in the morning and at bedtime. Patient taking differently: Take 10 mg by mouth in the morning and at bedtime. 09/20/20   Ladell Pier, MD  famotidine (PEPCID) 20 MG tablet Take 1 tablet (20 mg total) by mouth 2 (two) times daily. 11/11/20   Sherwood Gambler, MD  ferrous sulfate 325 (65 FE) MG tablet Take 1 tablet (325 mg total) by mouth daily with breakfast. 11/03/20   Roxan Hockey, MD  gabapentin (NEURONTIN) 300 MG capsule Take 1 capsule (300 mg total) by mouth 2 (two) times daily. 10/08/20  Marcine Matar, MD  lactose free nutrition (BOOST) LIQD Take 237 mLs by mouth 2 (two) times daily.    [provider]  lipase/protease/amylase (CREON) 36000 UNITS CPEP capsule Take 2 capsules (72,000 Units total) by mouth 3 (three) times daily with meals. May also take 1 capsule (36,000 Units total) as needed (with snacks). 10/16/20   Letta Median, PA-C  mirtazapine (REMERON) 15 MG tablet Take 1 tablet (15 mg total) by mouth at bedtime. 10/08/20   Marcine Matar, MD  ondansetron (ZOFRAN ODT) 8 MG disintegrating tablet Take 1 tablet (8 mg total) by mouth every 8 (eight) hours as needed for nausea or vomiting. 10/16/20   Letta Median, PA-C  pantoprazole (PROTONIX) 40 MG tablet Take 1 tablet (40 mg total) by mouth 2 (two) times daily. 11/03/20 12/03/20  Shon Hale, MD  promethazine (PHENERGAN) 25 MG suppository Place 1 suppository (25 mg total) rectally every 6 (six) hours as needed for nausea or vomiting.  11/11/20   Pricilla Loveless, MD  promethazine (PHENERGAN) 25 MG tablet Take 1 tablet (25 mg total) by mouth every 6 (six) hours as needed for nausea or vomiting. 11/11/20   Pricilla Loveless, MD  rivaroxaban (XARELTO) 10 MG TABS tablet Take 1 tablet (10 mg total) by mouth every evening. 11/08/20   Shon Hale, MD  sucralfate (CARAFATE) 1 GM/10ML suspension Take 10 mLs (1 g total) by mouth 4 (four) times daily -  with meals and at bedtime. 11/03/20   Shon Hale, MD  traZODone (DESYREL) 100 MG tablet Take 100 mg by mouth at bedtime. 11/04/20   [provider]    Inpatient Medications:  Current Facility-Administered Medications:  .  0.9 %  sodium chloride infusion, , Intravenous, Continuous, Zierle-Ghosh, Asia B, DO, Last Rate: 100 mL/hr at 11/23/20 0110, New Bag at 11/23/20 0110 .  acetaminophen (TYLENOL) tablet 650 mg, 650 mg, Oral, Q6H PRN **OR** acetaminophen (TYLENOL) suppository 650 mg, 650 mg, Rectal, Q6H PRN, Zierle-Ghosh, Asia B, DO .  amLODipine (NORVASC) tablet 2.5 mg, 2.5 mg, Oral, Daily, Zierle-Ghosh, Asia B, DO .  citalopram (CELEXA) tablet 20 mg, 20 mg, Oral, Daily, Zierle-Ghosh, Asia B, DO .  donepezil (ARICEPT) tablet 10 mg, 10 mg, Oral, QHS, Zierle-Ghosh, Asia B, DO .  famotidine (PEPCID) tablet 20 mg, 20 mg, Oral, BID, Zierle-Ghosh, Asia B, DO .  gabapentin (NEURONTIN) capsule 300 mg, 300 mg, Oral, BID, Zierle-Ghosh, Asia B, DO .  mirtazapine (REMERON) tablet 15 mg, 15 mg, Oral, QHS, Zierle-Ghosh, Asia B, DO .  morphine 2 MG/ML injection 2 mg, 2 mg, Intravenous, Q2H PRN, Zierle-Ghosh, Asia B, DO, 2 mg at 11/23/20 0644 .  ondansetron (ZOFRAN) tablet 4 mg, 4 mg, Oral, Q6H PRN **OR** ondansetron (ZOFRAN) injection 4 mg, 4 mg, Intravenous, Q6H PRN, Zierle-Ghosh, Asia B, DO .  pantoprazole (PROTONIX) injection 40 mg, 40 mg, Intravenous, Q24H, Zierle-Ghosh, Asia B, DO .  rivaroxaban (XARELTO) tablet 10 mg, 10 mg, Oral, QPM, Zierle-Ghosh, Asia B, DO .  traZODone (DESYREL)  tablet 100 mg, 100 mg, Oral, QHS, Zierle-Ghosh, Asia B, DO Allergies: Wheat extract  Complete Review of Systems: GENERAL: negative for malaise, night sweats HEENT: No changes in hearing or vision, no nose bleeds or other nasal problems. NECK: Negative for lumps, goiter, pain and significant neck swelling RESPIRATORY: Negative for cough, wheezing CARDIOVASCULAR: Negative for chest pain, leg swelling, palpitations, orthopnea GI: SEE HPI MUSCULOSKELETAL: Negative for joint pain or swelling, back pain, and muscle pain. SKIN: Negative for lesions, rash PSYCH:  Negative for sleep disturbance, mood disorder and recent psychosocial stressors. HEMATOLOGY Negative for prolonged bleeding, bruising easily, and swollen nodes. ENDOCRINE: Negative for cold or heat intolerance, polyuria, polydipsia and goiter. NEURO: negative for tremor, gait imbalance, syncope and seizures. The remainder of the review of systems is noncontributory.  Physical Exam: BP 136/75 (BP Location: Left Arm)   Pulse 72   Temp 97.6 F (36.4 C)   Resp 19   Ht $R'5\' 4"'VH$  (1.626 m)   Wt 79.4 kg   SpO2 94%   BMI 30.04 kg/m  GENERAL: The patient is AO x3, in no acute distress. HEENT: Head is normocephalic and atraumatic. EOMI are intact. Mouth is well hydrated and without lesions. NECK: Supple. No masses LUNGS: Clear to auscultation. No presence of rhonchi/wheezing/rales. Adequate chest expansion HEART: RRR, normal s1 and s2. ABDOMEN: Soft, nontender, no guarding, no peritoneal signs, and nondistended. BS +. No masses. EXTREMITIES: Without any cyanosis, clubbing, rash, lesions or edema. NEUROLOGIC: AOx3, no focal motor deficit. SKIN: no jaundice, no rashes  Laboratory Data CBC:     Component Value Date/Time   WBC 6.0 11/23/2020 0607   RBC 3.07 (L) 11/23/2020 0607   HGB 8.6 (L) 11/23/2020 0607   HCT 26.8 (L) 11/23/2020 0607   PLT 175 11/23/2020 0607   MCV 87.3 11/23/2020 0607   MCH 28.0 11/23/2020 0607   MCHC 32.1  11/23/2020 0607   RDW 15.6 (H) 11/23/2020 0607   LYMPHSABS 1.4 11/22/2020 2206   MONOABS 0.5 11/22/2020 2206   EOSABS 0.1 11/22/2020 2206   BASOSABS 0.0 11/22/2020 2206   COAG:  Lab Results  Component Value Date   INR 1.1 11/16/2020   INR 2.0 (H) 11/02/2020   INR 3.7 (H) 11/01/2020    BMP:  BMP Latest Ref Rng & Units 11/23/2020 11/22/2020 11/18/2020  Glucose 70 - 99 mg/dL 98 125(H) 91  BUN 8 - 23 mg/dL $Remove'10 13 8  'pPpxEqr$ Creatinine 0.44 - 1.00 mg/dL 0.61 0.75 0.66  Sodium 135 - 145 mmol/L 140 137 139  Potassium 3.5 - 5.1 mmol/L 3.3(L) 3.4(L) 3.3(L)  Chloride 98 - 111 mmol/L 108 105 105  CO2 22 - 32 mmol/L $RemoveB'23 22 24  'MUSbNhbh$ Calcium 8.9 - 10.3 mg/dL 8.2(L) 8.9 8.7(L)    HEPATIC:  Hepatic Function Latest Ref Rng & Units 11/23/2020 11/22/2020 11/16/2020  Total Protein 6.5 - 8.1 g/dL 5.5(L) 7.3 5.6(L)  Albumin 3.5 - 5.0 g/dL 3.1(L) 4.0 3.1(L)  AST 15 - 41 U/L 13(L) 22 13(L)  ALT 0 - 44 U/L $Remo'7 8 7  'WZhqG$ Alk Phosphatase 38 - 126 U/L 39 51 38  Total Bilirubin 0.3 - 1.2 mg/dL 0.4 0.4 0.5    CARDIAC:  Lab Results  Component Value Date   CKTOTAL 75 03/26/2020     Imaging: I personally reviewed and interpreted the available imaging.  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  The patient has presented recurrent episodes of abdominal pain.  It is unclear to me why she is presenting these recurrent episodes of abdominal pain although she has had persistent ulceration that could account for some of her current symptoms.  Fortunately, she has not presented any recurrent bleeding and based on the most recent endoscopic  pictures it seems that the ulcer is slightly smaller compared to prior.  Nevertheless, I consider that if the ulcer persists she may require surgical evaluation, which may require a tertiary center referral as her options may include a Whipple  surgery.  Notably, visualization of the pancreatic cystic lesion with calcifications could not be performed thoroughly due to the size of the ulcer, and definite diagnosis and treatment could be achieved with this surgery. It is unclear to me if her symptoms could be related to gallbladder etiology, for which gen surgery has been consulted.  For now we will need to continue PPI IV twice daily on management of nausea with Zofran as needed.  I counseled the patient to eat smaller meals through the day.  Pain control should be continued with the use of opiates as needed.  She also needs to continue her Remeron as these may help for nociception.  # Chronic abdominal pain #Large duodenal ulcer #Pancreatic lesion - c/w pantoprazole 40 mg BID - c/w Remeron bedtime - Pain control per primary team - Zofran as needed for nausea - Appreciate gen surgery recs - Consider referral to tertiary center for evaluating possible Whipple surgery  Harvel Quale, MD Gastroenterology and Hepatology [facility]

## 2020-11-24 DIAGNOSIS — R1010 Upper abdominal pain, unspecified: Secondary | ICD-10-CM | POA: Diagnosis not present

## 2020-11-24 DIAGNOSIS — K219 Gastro-esophageal reflux disease without esophagitis: Secondary | ICD-10-CM | POA: Diagnosis not present

## 2020-11-24 DIAGNOSIS — E876 Hypokalemia: Secondary | ICD-10-CM | POA: Diagnosis not present

## 2020-11-24 DIAGNOSIS — K277 Chronic peptic ulcer, site unspecified, without hemorrhage or perforation: Secondary | ICD-10-CM | POA: Diagnosis not present

## 2020-11-24 DIAGNOSIS — I10 Essential (primary) hypertension: Secondary | ICD-10-CM | POA: Diagnosis not present

## 2020-11-24 DIAGNOSIS — R1031 Right lower quadrant pain: Secondary | ICD-10-CM | POA: Diagnosis not present

## 2020-11-24 DIAGNOSIS — K8020 Calculus of gallbladder without cholecystitis without obstruction: Secondary | ICD-10-CM | POA: Diagnosis not present

## 2020-11-24 LAB — COMPREHENSIVE METABOLIC PANEL
ALT: 7 U/L (ref 0–44)
AST: 15 U/L (ref 15–41)
Albumin: 3.1 g/dL — ABNORMAL LOW (ref 3.5–5.0)
Alkaline Phosphatase: 39 U/L (ref 38–126)
Anion gap: 7 (ref 5–15)
BUN: 7 mg/dL — ABNORMAL LOW (ref 8–23)
CO2: 23 mmol/L (ref 22–32)
Calcium: 8.3 mg/dL — ABNORMAL LOW (ref 8.9–10.3)
Chloride: 112 mmol/L — ABNORMAL HIGH (ref 98–111)
Creatinine, Ser: 0.65 mg/dL (ref 0.44–1.00)
GFR, Estimated: >60 mL/min (ref >60)
Glucose, Bld: 82 mg/dL (ref 70–99)
Potassium: 3.2 mmol/L — ABNORMAL LOW (ref 3.5–5.1)
Sodium: 142 mmol/L (ref 135–145)
Total Bilirubin: 0.6 mg/dL (ref 0.3–1.2)
Total Protein: 5.8 g/dL — ABNORMAL LOW (ref 6.5–8.1)

## 2020-11-24 NOTE — Consult Note (Addendum)
[facility] Consult  Reason for Consult: Gallstones  Referring Physician:    Dr. Roger Shelter   Chief Complaint    Abdominal Pain      HPI: Maria Murphy is a 65 y.o. 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 She has been admitted repeatedly with upper abdominal pain, nausea and vomiting. She reports now that her pain is improved and she is tolerating a diet.  Her pain is the epigastric and RUQ when she has the episodes. She was seen by Dr. Rush Landmark with EUS 3/30 that showed continued ulcers but shrinking in size, and a cystic lesion of the pancreas that could not fully be seen, and there are plans for repeat EUS in 2-3 months. He did see a thin walled cyst without septa and no debris in the fluid but I do not see where any fluid was sampled.  He feels like she may ultimately need surgery for her chronic ulcers based on his documentation.   Past Medical History:  Diagnosis Date  . Collagen vascular disease ([place])   . Essential hypertension, benign 01/24/2015  . Foot ulcer ([place])   . GERD (gastroesophageal reflux disease)   . Hypertension   . Memory loss   . PUD (peptic ulcer disease)    with perforation s/p exploratory laparotomy  . Pulmonary embolus ([place]) 04/2020  . Skin cancer   . Vision abnormalities     Past Surgical History:  Procedure Laterality Date  . BIOPSY  11/20/2020   Procedure: BIOPSY;  Surgeon: Rush Landmark Telford Nab., MD;  Location: [facility];  Service: Gastroenterology;;  . CENTRAL LINE INSERTION Right 04/22/2020   Procedure: CENTRAL LINE INSERTION;  Surgeon: Virl Cagey, MD;  Location: [facility];  Service: General;  Laterality: Right;  . DIAGNOSTIC LAPAROSCOPY    . ESOPHAGOGASTRODUODENOSCOPY N/A 11/20/2020   Procedure: ESOPHAGOGASTRODUODENOSCOPY (EGD);   Surgeon: Irving Copas., MD;  Location: [facility];  Service: Gastroenterology;  Laterality: N/A;  . ESOPHAGOGASTRODUODENOSCOPY (EGD) WITH PROPOFOL N/A 07/10/2020   Procedure: ESOPHAGOGASTRODUODENOSCOPY (EGD) WITH PROPOFOL;  Surgeon: Rogene Houston, MD;  [facility];  Service: Endoscopy;  Laterality: N/A;  . ESOPHAGOGASTRODUODENOSCOPY (EGD) WITH PROPOFOL N/A 10/18/2020   Procedure: ESOPHAGOGASTRODUODENOSCOPY (EGD) WITH PROPOFOL;  Surgeon: Harvel Quale, MD;  [facility];  Service: Gastroenterology;  Laterality: N/A;  . EUS N/A 11/20/2020   Procedure: UPPER ENDOSCOPIC ULTRASOUND (EUS) LINEAR;  Surgeon: Irving Copas., MD;  Location: [facility];  Service: Gastroenterology;  Laterality: N/A;  . GASTRORRHAPHY  04/22/2020   Procedure: GASTRORRHAPHY;  Surgeon: Virl Cagey, MD;  Location: [facility];  Service: General;;  . IR CATHETER TUBE CHANGE  05/09/2020  . LAPAROTOMY N/A 04/22/2020   Procedure: EXPLORATORY LAPAROTOMY;  Surgeon: Virl Cagey, MD;  Location: [facility];  Service: General;  Laterality: N/A;  . TONSILLECTOMY      Family History  Problem Relation Age of Onset  . High Cholesterol Mother   . Hypertension Mother   . Arthritis/Rheumatoid Mother   . Alcohol abuse Father   . Hypertension Maternal Grandmother   . Colon cancer Neg Hx   . Colon polyps Neg Hx   . Inflammatory bowel disease Neg Hx     Social History   Tobacco Use  . Smoking status: Never Smoker  . Smokeless tobacco: Never Used  Vaping  Use  . Vaping Use: Never used  Substance Use Topics  . Alcohol use: Yes    Alcohol/week: 2.0 - 3.0 standard drinks    Types: 2 - 3 Glasses of wine per week  . Drug use: No    Medications:  I have reviewed the patient's current medications. Prior to Admission:  Medications Prior to Admission  Medication Sig Dispense Refill Last Dose  . acetaminophen (TYLENOL) 500 MG tablet Take 1 tablet (500 mg total) by mouth every  6 (six) hours as needed for mild pain or fever. (Patient taking differently: Take 1,000 mg by mouth as needed for mild pain or fever.)   11/21/2020  . amLODipine (NORVASC) 2.5 MG tablet Take 1 tablet (2.5 mg total) by mouth daily. For BP 90 tablet 3 11/21/2020  . citalopram (CELEXA) 20 MG tablet Take 20 mg by mouth daily.   11/21/2020  . dicyclomine (BENTYL) 10 MG capsule Take 10 mg by mouth 2 (two) times daily.   11/21/2020  . donepezil (ARICEPT) 10 MG tablet Take 0.5-1 tablets (5-10 mg total) by mouth in the morning and at bedtime. (Patient taking differently: Take 10 mg by mouth in the morning and at bedtime.) 30 tablet 2 11/21/2020  . famotidine (PEPCID) 20 MG tablet Take 1 tablet (20 mg total) by mouth 2 (two) times daily. 20 tablet 0 11/21/2020  . ferrous sulfate 325 (65 FE) MG tablet Take 1 tablet (325 mg total) by mouth daily with breakfast. 30 tablet 5 11/21/2020  . gabapentin (NEURONTIN) 300 MG capsule Take 1 capsule (300 mg total) by mouth 2 (two) times daily. 60 capsule 6 11/21/2020  . lactose free nutrition (BOOST) LIQD Take 237 mLs by mouth 2 (two) times daily.   11/21/2020  . lipase/protease/amylase (CREON) 36000 UNITS CPEP capsule Take 2 capsules (72,000 Units total) by mouth 3 (three) times daily with meals. May also take 1 capsule (36,000 Units total) as needed (with snacks). 240 capsule 11 11/21/2020  . mirtazapine (REMERON) 15 MG tablet Take 1 tablet (15 mg total) by mouth at bedtime. 30 tablet 2 11/21/2020  . ondansetron (ZOFRAN ODT) 8 MG disintegrating tablet Take 1 tablet (8 mg total) by mouth every 8 (eight) hours as needed for nausea or vomiting. 30 tablet 1 11/21/2020  . rivaroxaban (XARELTO) 10 MG TABS tablet Take 1 tablet (10 mg total) by mouth every evening. 30 tablet 2 11/24/2020 at 0800  . sucralfate (CARAFATE) 1 GM/10ML suspension Take 10 mLs (1 g total) by mouth 4 (four) times daily -  with meals and at bedtime. 420 mL 2 11/21/2020  . traZODone (DESYREL) 100 MG tablet Take 100 mg by  mouth at bedtime.   11/21/2020  . pantoprazole (PROTONIX) 40 MG tablet Take 1 tablet (40 mg total) by mouth 2 (two) times daily. 60 tablet 6 11/21/2020  . promethazine (PHENERGAN) 25 MG suppository Place 1 suppository (25 mg total) rectally every 6 (six) hours as needed for nausea or vomiting. 12 each 0 PRN at PRN  . promethazine (PHENERGAN) 25 MG tablet Take 1 tablet (25 mg total) by mouth every 6 (six) hours as needed for nausea or vomiting. 10 tablet 0 PRN at PRN   Scheduled: . amLODipine  2.5 mg Oral Daily  . citalopram  20 mg Oral Daily  . donepezil  10 mg Oral QHS  . famotidine  20 mg Oral BID  . gabapentin  300 mg Oral BID  . mirtazapine  15 mg Oral QHS  . pantoprazole (PROTONIX) IV  40 mg Intravenous Q24H  . rivaroxaban  10 mg Oral QPM  . traZODone  100 mg Oral QHS   Continuous: . sodium chloride 100 mL/hr at 11/24/20 0840   IWO:EHOZYYQMGNOIB **OR** acetaminophen, morphine injection, ondansetron **OR** ondansetron (ZOFRAN) IV  Allergies  Allergen Reactions  . Wheat Extract Swelling     ROS:  A comprehensive review of systems was negative except for: Gastrointestinal: positive for abdominal pain and nausea  Blood pressure (!) 153/70, pulse (!) 51, temperature (!) 97.5 F (36.4 C), temperature source Oral, resp. rate 14, height 5\' 4"  (1.626 m), weight 79.4 kg, SpO2 95 %. Physical Exam Vitals reviewed.  Constitutional:      Appearance: She is well-developed.  HENT:     Head: Normocephalic.  Cardiovascular:     Rate and Rhythm: Normal rate.  Pulmonary:     Effort: Pulmonary effort is normal.  Abdominal:     General: There is no distension.     Palpations: Abdomen is soft.     Tenderness: There is no abdominal tenderness.  Skin:    General: Skin is warm.  Neurological:     General: No focal deficit present.     Mental Status: She is alert and oriented to person, place, and time.  Psychiatric:        Mood and Affect: Mood normal.        Behavior: Behavior normal.      Results: Results for orders placed or performed during the hospital encounter of 11/22/20 (from the past 48 hour(s))  CBC with Differential/Platelet     Status: Abnormal   Collection Time: 11/22/20 10:06 PM  Result Value Ref Range   WBC 8.9 4.0 - 10.5 K/uL   RBC 3.53 (L) 3.87 - 5.11 MIL/uL   Hemoglobin 9.8 (L) 12.0 - 15.0 g/dL   HCT 30.8 (L) 36.0 - 46.0 %   MCV 87.3 80.0 - 100.0 fL   MCH 27.8 26.0 - 34.0 pg   MCHC 31.8 30.0 - 36.0 g/dL   RDW 15.7 (H) 11.5 - 15.5 %   Platelets 214 150 - 400 K/uL   nRBC 0.0 0.0 - 0.2 %   Neutrophils Relative % 78 %   Neutro Abs 6.9 1.7 - 7.7 K/uL   Lymphocytes Relative 15 %   Lymphs Abs 1.4 0.7 - 4.0 K/uL   Monocytes Relative 6 %   Monocytes Absolute 0.5 0.1 - 1.0 K/uL   Eosinophils Relative 1 %   Eosinophils Absolute 0.1 0.0 - 0.5 K/uL   Basophils Relative 0 %   Basophils Absolute 0.0 0.0 - 0.1 K/uL   Immature Granulocytes 0 %   Abs Immature Granulocytes 0.03 0.00 - 0.07 K/uL    Comment: Performed at [facility], [address], [place], [place] [address]  Comprehensive metabolic panel     Status: Abnormal   Collection Time: 11/22/20 10:06 PM  Result Value Ref Range   Sodium 137 135 - 145 mmol/L   Potassium 3.4 (L) 3.5 - 5.1 mmol/L   Chloride 105 98 - 111 mmol/L   CO2 22 22 - 32 mmol/L   Glucose, Bld 125 (H) 70 - 99 mg/dL    Comment: Glucose reference range applies only to samples taken after fasting for at least 8 hours.   BUN 13 8 - 23 mg/dL   Creatinine, Ser 0.75 0.44 - 1.00 mg/dL   Calcium 8.9 8.9 - 10.3 mg/dL   Total Protein 7.3 6.5 - 8.1 g/dL   Albumin 4.0 3.5 -  5.0 g/dL   AST 22 15 - 41 U/L   ALT 8 0 - 44 U/L   Alkaline Phosphatase 51 38 - 126 U/L   Total Bilirubin 0.4 0.3 - 1.2 mg/dL   GFR, Estimated >60 >60 mL/min    Comment: (NOTE) Calculated using the CKD-EPI Creatinine Equation (2021)    Anion gap 10 5 - 15    Comment: Performed at [facility], [address], [place], [place] [address]  Lipase, blood     Status:  None   Collection Time: 11/22/20 10:06 PM  Result Value Ref Range   Lipase 31 11 - 51 U/L    Comment: Performed at [facility], [address], [place], [place] [address]  SARS CORONAVIRUS 2 (TAT 6-24 HRS) Nasopharyngeal Nasopharyngeal Swab     Status: None   Collection Time: 11/23/20 12:06 AM   Specimen: Nasopharyngeal Swab  Result Value Ref Range   SARS Coronavirus 2 NEGATIVE NEGATIVE    Comment: (NOTE) SARS-CoV-2 target nucleic acids are NOT DETECTED.  The SARS-CoV-2 RNA is generally detectable in upper and lower respiratory specimens during the acute phase of infection. Negative results do not preclude SARS-CoV-2 infection, do not rule out co-infections with other pathogens, and should not be used as the sole basis for treatment or other patient management decisions. Negative results must be combined with clinical observations, patient history, and epidemiological information. The expected result is Negative.  Fact Sheet for Patients: SugarRoll.be  Fact Sheet for Healthcare Providers: https://www.woods-mathews.com/  This test is not yet approved or cleared by the [place] FDA and  has been authorized for detection and/or diagnosis of SARS-CoV-2 by FDA under an Emergency Use Authorization (EUA). This EUA will remain  in effect (meaning this test can be used) for the duration of the COVID-19 declaration under Se ction 564(b)(1) of the Act, 21 U.S.C. section 360bbb-3(b)(1), unless the authorization is terminated or revoked sooner.  Performed at [facility], [place] [address] [place], [place] [address]   Magnesium     Status: None   Collection Time: 11/23/20  6:07 AM  Result Value Ref Range   Magnesium 1.8 1.7 - 2.4 mg/dL    Comment: Performed at [facility], [address], [place], [place] [address]  Comprehensive metabolic panel     Status: Abnormal   Collection Time: 11/23/20  6:07 AM  Result Value Ref Range   Sodium  140 135 - 145 mmol/L   Potassium 3.3 (L) 3.5 - 5.1 mmol/L   Chloride 108 98 - 111 mmol/L   CO2 23 22 - 32 mmol/L   Glucose, Bld 98 70 - 99 mg/dL    Comment: Glucose reference range applies only to samples taken after fasting for at least 8 hours.   BUN 10 8 - 23 mg/dL   Creatinine, Ser 0.61 0.44 - 1.00 mg/dL   Calcium 8.2 (L) 8.9 - 10.3 mg/dL   Total Protein 5.5 (L) 6.5 - 8.1 g/dL   Albumin 3.1 (L) 3.5 - 5.0 g/dL   AST 13 (L) 15 - 41 U/L   ALT 7 0 - 44 U/L   Alkaline Phosphatase 39 38 - 126 U/L   Total Bilirubin 0.4 0.3 - 1.2 mg/dL   GFR, Estimated >60 >60 mL/min    Comment: (NOTE) Calculated using the CKD-EPI Creatinine Equation (2021)    Anion gap 9 5 - 15    Comment: Performed at [facility], [address] [place], [place] [address]  CBC  Status: Abnormal   Collection Time: 11/23/20  6:07 AM  Result Value Ref Range   WBC 6.0 4.0 - 10.5 K/uL   RBC 3.07 (L) 3.87 - 5.11 MIL/uL   Hemoglobin 8.6 (L) 12.0 - 15.0 g/dL   HCT 26.8 (L) 36.0 - 46.0 %   MCV 87.3 80.0 - 100.0 fL   MCH 28.0 26.0 - 34.0 pg   MCHC 32.1 30.0 - 36.0 g/dL   RDW 15.6 (H) 11.5 - 15.5 %   Platelets 175 150 - 400 K/uL   nRBC 0.0 0.0 - 0.2 %    Comment: Performed at [facility], [address] [place], [place] [address]  Comprehensive metabolic panel     Status: Abnormal   Collection Time: 11/24/20  8:37 AM  Result Value Ref Range   Sodium 142 135 - 145 mmol/L   Potassium 3.2 (L) 3.5 - 5.1 mmol/L   Chloride 112 (H) 98 - 111 mmol/L   CO2 23 22 - 32 mmol/L   Glucose, Bld 82 70 - 99 mg/dL    Comment: Glucose reference range applies only to samples taken after fasting for at least 8 hours.   BUN 7 (L) 8 - 23 mg/dL   Creatinine, Ser 0.65 0.44 - 1.00 mg/dL   Calcium 8.3 (L) 8.9 - 10.3 mg/dL   Total Protein 5.8 (L) 6.5 - 8.1 g/dL   Albumin 3.1 (L) 3.5 - 5.0 g/dL   AST 15 15 - 41 U/L   ALT 7 0 - 44 U/L   Alkaline Phosphatase 39 38 - 126 U/L   Total Bilirubin 0.6 0.3 - 1.2 mg/dL   GFR, Estimated >60 >60  mL/min    Comment: (NOTE) Calculated using the CKD-EPI Creatinine Equation (2021)    Anion gap 7 5 - 15    Comment: Performed at [facility], [address], [place], [place] [address]   Personally reviewed- stones in the gallbladder, pancreatic cystic lesion stable in appearance   CT ABDOMEN PELVIS WO CONTRAST  Result Date: 11/22/2020 CLINICAL DATA:  Right lower quadrant pain EXAM: CT ABDOMEN AND PELVIS WITHOUT CONTRAST TECHNIQUE: Multidetector CT imaging of the abdomen and pelvis was performed following the standard protocol without IV contrast. COMPARISON:  Ultrasound 01/15/2021, CT 11/14/2020, 11/10/2020, 04/20/2020 FINDINGS: Lower chest: Lung bases demonstrate no acute consolidation or effusion. Linear atelectasis or scarring at the lingula. Mild cardiomegaly. Small hiatal hernia. Hepatobiliary: Stable subcentimeter hypodensity in the liver, too small to further characterize. Multiple calcified gallstones. No biliary dilatation. Pancreas: No definite acute inflammatory change. Calcifications at the head of pancreas. Cystic lesion measuring 4.1 by 2.9 cm, previously 4.2 cm. Spleen: Normal in size without focal abnormality. Adrenals/Urinary Tract: Adrenal glands within normal limits. Cysts within the kidneys. Rim calcified cyst exophytic upper pole right kidney without change. No hydronephrosis. The bladder is normal Stomach/Bowel: Stomach is within normal limits. Appendix appears normal. No evidence of bowel wall thickening, distention, or inflammatory changes. Vascular/Lymphatic: Nonaneurysmal aorta. Mild aortic atherosclerosis. No suspicious nodes Reproductive: Multiple uterine masses consistent with fibroids. Other: Negative for free air or free fluid Musculoskeletal: Grade 1 anterolisthesis L4 on L5. Degenerative changes of the lumbar spine. No acute osseous abnormality IMPRESSION: 1. Negative for acute appendicitis. 2. Gallstones. No biliary dilatation. 3. Stable 4.1 cm cystic lesion and  calcification within the head of the pancreas. 4. Fibroid uterus. Aortic Atherosclerosis (ICD10-I70.0). Electronically Signed   By: Donavan Foil M.D.   On: 11/22/2020 23:17     Assessment & Plan:  Verdis Frederickson  Fugere is a 65 y.o. female with a complicated history of chronic peptic ulcers and prior perforation s/p Phillip Heal patch complicated by Saddle PE on Xarelto now, pancreatic lesion that has not been fully evaluated and may potentially need surgery for her peptic ulcer disease if not something for her pancreatic lesion once it is examined more fully as it could be a pseudocyst or a mucinous neoplasm based on her MRCP from 03/2020 and follow up CT imaging.  She does have gallstones but again she has multiple other things going on and these have not been fully evaluated or treated and will complicate any plans for cholecystectomy as she may ultimately more extensive surgery/ possibly even a Whipple.Gastrin samples have been wnl.  She is now tolerating clears.   No plans for cholecystectomy at this time I agree with Dr. Jenetta Downer and she may need referral to tertiary care center to further evaluate and determine best plan moving forward as she continues to have these readmissions and issues.  +/- repeating MRCP to see if it is more definitive regarding potential for mucinous neoplasm? Will discuss with GI and Dr. Roger Shelter.   Advance diet as tolerated.    Virl Cagey 11/24/2020, 12:55 PM

## 2020-11-24 NOTE — Progress Notes (Signed)
Maria Murphy, M.D. Gastroenterology & Hepatology   Interval History: No acute vents overnight. Patient states that she has presented improvement of her symptoms while hospitalized.  She denies having any episodes of nausea or vomiting.  States that her abdominal pain is much more controlled compared to prior.  Upon review of her chart, the patient has received some doses of morphine yesterday IV but no other doses today.  Denies any melena or hematochezia.  Labs from today showed persistent hypokalemia of 3.2, normal liver function tests and kidney function.  Inpatient Medications:  Current Facility-Administered Medications:  .  0.9 %  sodium chloride infusion, , Intravenous, Continuous, Zierle-Ghosh, Asia B, DO, Last Rate: 100 mL/hr at 11/24/20 0840, New Bag at 11/24/20 0840 .  acetaminophen (TYLENOL) tablet 650 mg, 650 mg, Oral, Q6H PRN **OR** acetaminophen (TYLENOL) suppository 650 mg, 650 mg, Rectal, Q6H PRN, Zierle-Ghosh, Asia B, DO .  amLODipine (NORVASC) tablet 2.5 mg, 2.5 mg, Oral, Daily, Zierle-Ghosh, Asia B, DO, 2.5 mg at 11/24/20 0836 .  citalopram (CELEXA) tablet 20 mg, 20 mg, Oral, Daily, Zierle-Ghosh, Asia B, DO, 20 mg at 11/24/20 0836 .  donepezil (ARICEPT) tablet 10 mg, 10 mg, Oral, QHS, Zierle-Ghosh, Asia B, DO, 10 mg at 11/23/20 2212 .  famotidine (PEPCID) tablet 20 mg, 20 mg, Oral, BID, Zierle-Ghosh, Asia B, DO, 20 mg at 11/24/20 0836 .  gabapentin (NEURONTIN) capsule 300 mg, 300 mg, Oral, BID, Zierle-Ghosh, Asia B, DO, 300 mg at 11/24/20 0836 .  mirtazapine (REMERON) tablet 15 mg, 15 mg, Oral, QHS, Zierle-Ghosh, Asia B, DO, 15 mg at 11/23/20 2212 .  morphine 2 MG/ML injection 2 mg, 2 mg, Intravenous, Q2H PRN, Zierle-Ghosh, Asia B, DO, 2 mg at 11/23/20 1537 .  ondansetron (ZOFRAN) tablet 4 mg, 4 mg, Oral, Q6H PRN **OR** ondansetron (ZOFRAN) injection 4 mg, 4 mg, Intravenous, Q6H PRN, Zierle-Ghosh, Asia B, DO .  pantoprazole (PROTONIX) injection 40 mg, 40 mg, Intravenous,  Q24H, Zierle-Ghosh, Asia B, DO, 40 mg at 11/24/20 0838 .  rivaroxaban (XARELTO) tablet 10 mg, 10 mg, Oral, QPM, Zierle-Ghosh, Asia B, DO, 10 mg at 11/23/20 1832 .  traZODone (DESYREL) tablet 100 mg, 100 mg, Oral, QHS, Zierle-Ghosh, Asia B, DO, 100 mg at 11/23/20 2212   I/O    Intake/Output Summary (Last 24 hours) at 11/24/2020 1124 Last data filed at 11/24/2020 0900 Gross per 24 hour  Intake 120 ml  Output 200 ml  Net -80 ml     Physical Exam: Temp:  [97.5 F (36.4 C)-98.6 F (37 C)] 97.5 F (36.4 C) (04/03 0350) Pulse Rate:  [51-60] 51 (04/03 0350) Resp:  [14-20] 14 (04/03 0350) BP: (131-153)/(70-78) 153/70 (04/03 0836) SpO2:  [95 %-97 %] 95 % (04/03 0350)  Temp (24hrs), Avg:98 F (36.7 C), Min:97.5 F (36.4 C), Max:98.6 F (37 C)  GENERAL: The patient is AO x3, in no acute distress. HEENT: Head is normocephalic and atraumatic. EOMI are intact. Mouth is well hydrated and without lesions. NECK: Supple. No masses LUNGS: Clear to auscultation. No presence of rhonchi/wheezing/rales. Adequate chest expansion HEART: RRR, normal s1 and s2. ABDOMEN: Soft, nontender, no guarding, no peritoneal signs, and nondistended. BS +. No masses. EXTREMITIES: Without any cyanosis, clubbing, rash, lesions or edema. NEUROLOGIC: AOx3, no focal motor deficit. SKIN: no jaundice, no rashes  Laboratory Data: CBC:     Component Value Date/Time   WBC 6.0 11/23/2020 0607   RBC 3.07 (L) 11/23/2020 0607   HGB 8.6 (L) 11/23/2020 0607   HCT 26.8 (  L) 11/23/2020 0607   PLT 175 11/23/2020 0607   MCV 87.3 11/23/2020 0607   MCH 28.0 11/23/2020 0607   MCHC 32.1 11/23/2020 0607   RDW 15.6 (H) 11/23/2020 0607   LYMPHSABS 1.4 11/22/2020 2206   MONOABS 0.5 11/22/2020 2206   EOSABS 0.1 11/22/2020 2206   BASOSABS 0.0 11/22/2020 2206   COAG:  Lab Results  Component Value Date   INR 1.1 11/16/2020   INR 2.0 (H) 11/02/2020   INR 3.7 (H) 11/01/2020    BMP:  BMP Latest Ref Rng & Units 11/24/2020 11/23/2020  11/22/2020  Glucose 70 - 99 mg/dL 82 98 125(H)  BUN 8 - 23 mg/dL 7(L) 10 13  Creatinine 0.44 - 1.00 mg/dL 0.65 0.61 0.75  Sodium 135 - 145 mmol/L 142 140 137  Potassium 3.5 - 5.1 mmol/L 3.2(L) 3.3(L) 3.4(L)  Chloride 98 - 111 mmol/L 112(H) 108 105  CO2 22 - 32 mmol/L _0 Calcium 8.9 - 10.3 mg/dL 8.3(L) 8.2(L) 8.9    HEPATIC:  Hepatic Function Latest Ref Rng & Units 11/24/2020 11/23/2020 11/22/2020  Total Protein 6.5 - 8.1 g/dL 5.8(L) 5.5(L) 7.3  Albumin 3.5 - 5.0 g/dL 3.1(L) 3.1(L) 4.0  AST 15 - 41 U/L 15 13(L) 22  ALT 0 - 44 U/L _1 Alk Phosphatase 38 - 126 U/L 39 39 51  Total Bilirubin 0.3 - 1.2 mg/dL 0.6 0.4 0.4    CARDIAC:  Lab Results  Component Value Date   CKTOTAL 75 03/26/2020      Imaging: I personally reviewed and interpreted the available labs, imaging and endoscopic files.   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  The patient has presented recurrent episodes of abdominal pain.  It is unclear to me why she is presenting these recurrent episodes of abdominal pain although she had persistent ulceration that could account for some of her current symptoms.  Fortunately, she has not presented any recurrent bleeding and based on the most recent endoscopic pictures it seems that the ulcer is slightly smaller compared to prior.  Nevertheless, I consider that if the ulcer persists she may require surgical evaluation, which may require a tertiary center referral as her options may include a Whipple  surgery.  As the patient has presented with some improvement of her symptoms with  pain medication, this could be discussed as outpatient and will not necessarily require a transfer from this hospital to a tertiary center. Notably, visualization of the pancreatic  calcified cystic lesion could not be performed thoroughly due to the size of the ulcer, and definite diagnosis and treatment could be achieved as well with this surgery. It is unclear to me if her symptoms could be related to gallbladder etiology, for which gen surgery has been consulted but as she is not presenting classical symptoms no acute intervention is warranted at this time.    She will need to continue PPI IV twice daily on management of nausea with Zofran as needed.  I counseled the patient to eat smaller meals through the day.  Pain control should be continued with the use of opiates as needed.  She also needs to continue her Remeron as these may help for nociception.  # Chronic  abdominal pain #Large duodenal ulcer #Pancreatic lesion - c/w pantoprazole 40 mg BID - c/w Remeron bedtime - Pain control per primary team - Zofran as needed for nausea - Appreciate gen surgery recs -She will require a repeat endoscopic ultrasound in 2 to 3 months based on Dr. Donneta Romberg recommendation. - Consider referral to tertiary center for evaluating possible Whipple surgery if recalcitrant pain, could be discussed as outpatient  Maria Peppers, MD Gastroenterology and Hepatology [facility]

## 2020-11-24 NOTE — Progress Notes (Signed)
PROGRESS NOTE    Patient: Maria Murphy                            PCP: Ladell Pier, MD                    DOB: [birth year]            DOA: 11/22/2020 KDX:[id number]             DOS: 11/24/2020, 11:05 AM   LOS: 1 day   Date of Service: The patient was seen and examined on 11/24/2020  Subjective:   The patient was seen and examined this morning. Still reporting abdominal pain, but improved with pain medication. She has been n.p.o. No nausea or vomiting overnight   Brief Narrative:  Maria Murphy  is a 65 y.o. female, with history of PE, PUD, HTN, GERD, and more presents to the ED with a chief complaint of abdominal pain. The pain started at 5pm 11/22/20 in her right lower quadrant. Patient reports that she drank 8oz of wate  r at once, and that is when the pain started. At first it was 10/10. Now it is 5/10. She reports that she had 5-6 episodes of vomiting as well. Her last normal BM was the same day the pain started.She reports that the pain was constant and sharp until she received pain medications in the ED. did feel like her normal abdominal pain.  She felt associated flushing as well.  She denies any fever or chills.  She denies dysuria, hematemesis, hematochezia, and melena.  Patient has no other complaints at this time.  Does not smoke, drinks wine daily with meals, and does not use illicit drugs.  Patient is full code.  Patient was last seen by GI earlier this month for hospital follow-up.  They noted that she has a history of peptic ulcer disease that was complicated by perforation requiring a Phillip Heal patch.  She is also had a chronic pancreatic lesion that they mention is concerning for malignancy.  She complained of abdominal pain at that visit as well, which they attribute to known ulcers.  They have advised her to strictly avoid NSAIDs.  She is advised to keep her appointment with Dr. Rush Landmark regarding her lesion of her pancreas, and to follow-up with GI in 2  months.  In the ED Temp 98.7, heart rate 100, respiratory rate 22, blood pressure 164/105, satting 100% No leukocytosis with a white blood cell count of 8.9, hemoglobin 9.8 Chemistry panel reveals a hypokalemia at 3.4 CT abdomen shows negative for acute appendicitis, gallstones.  No biliary dilatation.  Stable 4.1 cm cystic lesion and calcification in the head of the pancreas.  Lastly fibroid uterus.  Patient was given a 500 bolus in the ED, Zofran, Protonix, Dilaudid    Assessment & Plan:   Principal Problem:   Abdominal pain Active Problems:   Essential hypertension   GERD (gastroesophageal reflux disease)   Hypokalemia  Abdominal pain Abdominal pain with history of nonobstructive cholelithiasis, peptic ulcer disease-chronic -Abdominal pain has improved with pain meds -Dominic overnight -N.p.o., no intervention per consultants therefore will advance to clear liquid diet today   -GI consulted, seen evaluate patient: Reporting chronic issue large duodenal ulcer recommended continue PPI twice daily pain control antiemetics and consideration for transfer to tertiary care for possible Whipple's procedure -General surgery; consulted, reporting no intervention, no signs of cholecystitis -Continuing IV fluids -As needed analgesics,  morphine -As needed antiemetics -CT abdomen pelvis reviewed: No signs of pancreatitis, gallstones with no obstruction, 4.1 cm cystic lesion at the head of pancreas, fibroid uterus,  -We will continue to monitor  Chronic cryptic ulcer disease -Per GI continue Protonix IV twice daily -As needed pain medication, Carafate -Reporting if no improvement to consider transferring the patient to tertiary care for possible open procedure -Recommended no intervention at this time  Cholelithiasis, - nonobstructive, monitoring, general surgery following, no intervention at this time  Hypokalemia -monitoring and repleting, checking magnesium  Hypertension-stable,  will continue home medication of amlodipine  History of pulmonary embolism-on Xarelto, will continue at this time  GERD -currently on IV Protonix 40 daily twice daily  Depression-stable continue mirtazapine, Neurontin, Celexa,    ----------------------------------------------------------------------------------------------------------------------------------------- Nutritional status:  The patient's BMI is: Body mass index is 30.04 kg/m. I agree with the assessment and plan as outlined ----------------------------------------------------------------------------------------------------------------------------------------- Cultures; none Antimicrobials: None    Consultants: Gastroenterologist/general surgery   ------------------------------------------------------------------------------------------------------------------------------------------------  DVT prophylaxis:  SCD/Compression stockings and WF:UXNATFT  Code Status:   Code Status: Full Code  Family Communication: Cussed with patient  No family member present at bedside- The above findings and plan of care has been discussed with patient  in detail,  they expressed understanding and agreement of above. -Advance care planning has been discussed.   Admission status:   Status is: Observation  The patient remains OBS appropriate and will d/c before 2 midnights.  Dispo: The patient is from: Home              Anticipated d/c is to: Home in 1-2 days               Patient currently is not medically stable to d/c.   Difficult to place patient No      Level of care: Med-Surg   Procedures:   No admission procedures for hospital encounter.     Antimicrobials:  Anti-infectives (From admission, onward)   None       Medication:  . amLODipine  2.5 mg Oral Daily  . citalopram  20 mg Oral Daily  . donepezil  10 mg Oral QHS  . famotidine  20 mg Oral BID  . gabapentin  300 mg Oral BID  . mirtazapine  15 mg  Oral QHS  . pantoprazole (PROTONIX) IV  40 mg Intravenous Q24H  . rivaroxaban  10 mg Oral QPM  . traZODone  100 mg Oral QHS    acetaminophen **OR** acetaminophen, morphine injection, ondansetron **OR** ondansetron (ZOFRAN) IV   Objective:   Vitals:   11/23/20 1357 11/23/20 2103 11/24/20 0350 11/24/20 0836  BP: 131/74 (!) 147/78 (!) 141/76 (!) 153/70  Pulse: 60 (!) 58 (!) 51   Resp: 16 20 14    Temp: 98.6 F (37 C) 98 F (36.7 C) (!) 97.5 F (36.4 C)   TempSrc: Oral  Oral   SpO2: 97% 97% 95%   Weight:      Height:        Intake/Output Summary (Last 24 hours) at 11/24/2020 1105 Last data filed at 11/24/2020 0900 Gross per 24 hour  Intake 120 ml  Output 200 ml  Net -80 ml   Filed Weights   11/22/20 2204  Weight: 79.4 kg     Examination:     Physical Exam:   General:  Alert, oriented, cooperative, no distress;   HEENT:  Normocephalic, PERRL, otherwise with in Normal limits   Neuro:  CNII-XII intact. , normal motor and sensation, reflexes  intact   Lungs:   Clear to auscultation BL, Respirations unlabored, no wheezes / crackles  Cardio:    S1/S2, RRR, No murmure, No Rubs or Gallops   Abdomen:   Soft, diffuse abdominal tenderness with deep palpation, especially right upper quadrant--- bowel sounds active all four quadrants,  no guarding or peritoneal signs.  Muscular skeletal:  Limited exam - in bed, able to move all 4 extremities, Normal strength,  2+ pulses,  symmetric, No pitting edema  Skin:  Dry, warm to touch, negative for any Rashes,  Wounds: Please see nursing documentation        -------------------------------------------------------------------------------------------------------------------    LABs:  CBC Latest Ref Rng & Units 11/23/2020 11/22/2020 11/17/2020  WBC 4.0 - 10.5 K/uL 6.0 8.9 4.1  Hemoglobin 12.0 - 15.0 g/dL 8.6(L) 9.8(L) 9.1(L)  Hematocrit 36.0 - 46.0 % 26.8(L) 30.8(L) 29.0(L)  Platelets 150 - 400 K/uL 175 214 188   CMP Latest Ref Rng &  Units 11/24/2020 11/23/2020 11/22/2020  Glucose 70 - 99 mg/dL 82 98 125(H)  BUN 8 - 23 mg/dL 7(L) 10 13  Creatinine 0.44 - 1.00 mg/dL 0.65 0.61 0.75  Sodium 135 - 145 mmol/L 142 140 137  Potassium 3.5 - 5.1 mmol/L 3.2(L) 3.3(L) 3.4(L)  Chloride 98 - 111 mmol/L 112(H) 108 105  CO2 22 - 32 mmol/L 23 23 22   Calcium 8.9 - 10.3 mg/dL 8.3(L) 8.2(L) 8.9  Total Protein 6.5 - 8.1 g/dL 5.8(L) 5.5(L) 7.3  Total Bilirubin 0.3 - 1.2 mg/dL 0.6 0.4 0.4  Alkaline Phos 38 - 126 U/L 39 39 51  AST 15 - 41 U/L 15 13(L) 22  ALT 0 - 44 U/L 7 7 8        Micro Results Recent Results (from the past 240 hour(s))  SARS CORONAVIRUS 2 (TAT 6-24 HRS) Nasopharyngeal Nasopharyngeal Swab     Status: None   Collection Time: 11/15/20  4:55 AM   Specimen: Nasopharyngeal Swab  Result Value Ref Range Status   SARS Coronavirus 2 NEGATIVE NEGATIVE Final    Comment: (NOTE) SARS-CoV-2 target nucleic acids are NOT DETECTED.  The SARS-CoV-2 RNA is generally detectable in upper and lower respiratory specimens during the acute phase of infection. Negative results do not preclude SARS-CoV-2 infection, do not rule out co-infections with other pathogens, and should not be used as the sole basis for treatment or other patient management decisions. Negative results must be combined with clinical observations, patient history, and epidemiological information. The expected result is Negative.  Fact Sheet for Patients: SugarRoll.be  Fact Sheet for Healthcare Providers: https://www.woods-mathews.com/  This test is not yet approved or cleared by the [place] FDA and  has been authorized for detection and/or diagnosis of SARS-CoV-2 by FDA under an Emergency Use Authorization (EUA). This EUA will remain  in effect (meaning this test can be used) for the duration of the COVID-19 declaration under Se ction 564(b)(1) of the Act, 21 U.S.C. section 360bbb-3(b)(1), unless the authorization is  terminated or revoked sooner.  Performed at [facility], [place] [address], [place], [place] [address]   Culture, Urine     Status: Abnormal   Collection Time: 11/15/20  3:00 PM   Specimen: Urine, Clean Catch  Result Value Ref Range Status   Specimen Description   Final    URINE, CLEAN CATCH Performed at [facility], [address], [place], [place] [address]    Special Requests   Final    NONE Performed at [facility], [place]  [address], [place], [place] [address]    Culture (A)  Final    40,000 COLONIES/mL LACTOBACILLUS SPECIES Standardized susceptibility testing for this organism is not available. Performed at [facility], [place] [address], [place], [place] [address]    Report Status 11/17/2020 FINAL  Final  SARS CORONAVIRUS 2 (TAT 6-24 HRS) Nasopharyngeal Nasopharyngeal Swab     Status: None   Collection Time: 11/23/20 12:06 AM   Specimen: Nasopharyngeal Swab  Result Value Ref Range Status   SARS Coronavirus 2 NEGATIVE NEGATIVE Final    Comment: (NOTE) SARS-CoV-2 target nucleic acids are NOT DETECTED.  The SARS-CoV-2 RNA is generally detectable in upper and lower respiratory specimens during the acute phase of infection. Negative results do not preclude SARS-CoV-2 infection, do not rule out co-infections with other pathogens, and should not be used as the sole basis for treatment or other patient management decisions. Negative results must be combined with clinical observations, patient history, and epidemiological information. The expected result is Negative.  Fact Sheet for Patients: SugarRoll.be  Fact Sheet for Healthcare Providers: https://www.woods-mathews.com/  This test is not yet approved or cleared by the [place] FDA and  has been authorized for detection and/or diagnosis of SARS-CoV-2 by FDA under an Emergency Use Authorization (EUA). This EUA will remain  in effect (meaning this test can be used) for  the duration of the COVID-19 declaration under Se ction 564(b)(1) of the Act, 21 U.S.C. section 360bbb-3(b)(1), unless the authorization is terminated or revoked sooner.  Performed at [facility], [place] [address], [place], [place] [address]     Radiology Reports CT ABDOMEN PELVIS WO CONTRAST  Result Date: 11/22/2020 CLINICAL DATA:  Right lower quadrant pain EXAM: CT ABDOMEN AND PELVIS WITHOUT CONTRAST TECHNIQUE: Multidetector CT imaging of the abdomen and pelvis was performed following the standard protocol without IV contrast. COMPARISON:  Ultrasound 01/15/2021, CT 11/14/2020, 11/10/2020, 04/20/2020 FINDINGS: Lower chest: Lung bases demonstrate no acute consolidation or effusion. Linear atelectasis or scarring at the lingula. Mild cardiomegaly. Small hiatal hernia. Hepatobiliary: Stable subcentimeter hypodensity in the liver, too small to further characterize. Multiple calcified gallstones. No biliary dilatation. Pancreas: No definite acute inflammatory change. Calcifications at the head of pancreas. Cystic lesion measuring 4.1 by 2.9 cm, previously 4.2 cm. Spleen: Normal in size without focal abnormality. Adrenals/Urinary Tract: Adrenal glands within normal limits. Cysts within the kidneys. Rim calcified cyst exophytic upper pole right kidney without change. No hydronephrosis. The bladder is normal Stomach/Bowel: Stomach is within normal limits. Appendix appears normal. No evidence of bowel wall thickening, distention, or inflammatory changes. Vascular/Lymphatic: Nonaneurysmal aorta. Mild aortic atherosclerosis. No suspicious nodes Reproductive: Multiple uterine masses consistent with fibroids. Other: Negative for free air or free fluid Musculoskeletal: Grade 1 anterolisthesis L4 on L5. Degenerative changes of the lumbar spine. No acute osseous abnormality IMPRESSION: 1. Negative for acute appendicitis. 2. Gallstones. No biliary dilatation. 3. Stable 4.1 cm cystic lesion and calcification within  the head of the pancreas. 4. Fibroid uterus. Aortic Atherosclerosis (ICD10-I70.0). Electronically Signed   By: Donavan Foil M.D.   On: 11/22/2020 23:17   CT Angio Chest PE W and/or Wo Contrast  Result Date: 11/10/2020 CLINICAL DATA:  66 year old female with right rib and flank pain. History of PE in September 2021. Abdominal pain with black tarry stools. History of cholelithiasis. History of exploratory laparotomy, perforated gastric ulcer in August 2021. EXAM: CT ANGIOGRAPHY CHEST CT ABDOMEN AND PELVIS WITH CONTRAST TECHNIQUE: Multidetector CT imaging of the chest was performed using the  standard protocol during bolus administration of intravenous contrast. Multiplanar CT image reconstructions and MIPs were obtained to evaluate the vascular anatomy. Multidetector CT imaging of the abdomen and pelvis was performed using the standard protocol during bolus administration of intravenous contrast. CONTRAST:  165mL OMNIPAQUE IOHEXOL 350 MG/ML SOLN COMPARISON:  CT Abdomen and Pelvis 10/15/2020 and earlier. CTA chest 05/01/2020. FINDINGS: CTA CHEST FINDINGS Cardiovascular: Good contrast bolus timing in the pulmonary arterial tree. No focal filling defect identified in the pulmonary arteries today to suggest acute or residual pulmonary embolism. Minimal calcified coronary artery atherosclerosis is evident. Mild cardiomegaly. No pericardial effusion. Minimal plaque of the thoracic aorta. Mediastinum/Nodes: Negative.  No lymphadenopathy. Lungs/Pleura: Somewhat lower lung volumes compared to last year. Resolved pleural effusions and lower lobe collapse or consolidation. Mild mosaic attenuation and mild peribronchial streaky opacity in the lingula, favor a combination of atelectasis and mild gas trapping. Major airways are patent. No areas suspicious for acute respiratory infection. Musculoskeletal: Chronic severe T10-T11 disc and endplate degeneration. Chronic severe degeneration at the visible shoulders. No acute osseous  abnormality identified. Review of the MIP images confirms the above findings. CT ABDOMEN and PELVIS FINDINGS Hepatobiliary: Chronic gallstones individually up to 16 mm. Continued indistinct appearance of the porta hepatis which appears mildly inflamed. But at the gallbladder fundus no wall thickening or pericholecystic inflammation is evident. No hyperenhancement at the gallbladder fossa. Stable liver parenchyma including tiny benign appearing low-density area at the dome. No bile duct enlargement. Pancreas: Abnormal partially calcified multi cystic appearing pancreatic head mass as described on the CT last month appears stable measuring 4.3 cm on series 4, image 37. No main pancreatic ductal dilatation. No superimposed acute pancreatitis suspected. However, along with the indistinct porta hepatis the gastric antrum and proximal duodenum continue to appear indistinct and inflamed. See coronal image 35 today. Compared to last month, the distal stomach and proximal duodenum appear mildly improved. No extraluminal gas or fluid identified. Proximal stomach and distal duodenum appear within normal limits. Spleen: Negative. Adrenals/Urinary Tract: Adrenal glands remain normal. Stable kidneys with bilateral parapelvic and exophytic renal cystic lesions which appeared benign by MRI in August of last year. Symmetric renal contrast excretion with no hydronephrosis or hydroureter. Diminutive and unremarkable urinary bladder. Stable pelvic phleboliths. Stomach/Bowel: Sigmoid diverticulosis again noted. No active inflammation identified. Otherwise redundant large bowel. Retained stool in the right colon and at the hepatic flexure. Normal appendix visible on series 4, image 60. Decompressed and negative terminal ileum. No dilated small bowel. Stomach and duodenum described above. No free air. No free fluid. Vascular/Lymphatic: Major arterial structures remain patent with tortuosity but minimal atherosclerosis. Portal venous  system is patent. No lymphadenopathy. Reproductive: Stable lobulated fibroid uterus. Other: No pelvic free fluid. Musculoskeletal: Stable advanced lumbar disc and facet degeneration. No acute osseous abnormality identified. Review of the MIP images confirms the above findings. IMPRESSION: CHEST: 1. Negative for acute or residual pulmonary embolus. 2. Mild scattered pulmonary atelectasis and gas trapping. No other acute process in the chest. ABDOMEN AND PELVIS: 1. Largely stable CT appearance of the abdomen and pelvis since 10/15/2020 (please see also that report): 2. Stable cystic and partially calcified pancreatic head mass, 4.3 cm. 3. Slightly improved appearance of the gastric antrum, duodenal bulb, and porta hepatis from last month. As before this may reflect improving peptic ulcer disease. Underlying chronic cholelithiasis, without strong evidence of acute cholecystitis today. 4. Fibroid uterus. Multiple renal cysts which had a benign MRI appearance last yea. Sigmoid diverticulosis. Electronically Signed  By: Genevie Ann M.D.   On: 11/10/2020 06:10   CT ABDOMEN PELVIS W CONTRAST  Result Date: 11/10/2020 CLINICAL DATA:  65 year old female with right rib and flank pain. History of PE in September 2021. Abdominal pain with black tarry stools. History of cholelithiasis. History of exploratory laparotomy, perforated gastric ulcer in August 2021. EXAM: CT ANGIOGRAPHY CHEST CT ABDOMEN AND PELVIS WITH CONTRAST TECHNIQUE: Multidetector CT imaging of the chest was performed using the standard protocol during bolus administration of intravenous contrast. Multiplanar CT image reconstructions and MIPs were obtained to evaluate the vascular anatomy. Multidetector CT imaging of the abdomen and pelvis was performed using the standard protocol during bolus administration of intravenous contrast. CONTRAST:  166mL OMNIPAQUE IOHEXOL 350 MG/ML SOLN COMPARISON:  CT Abdomen and Pelvis 10/15/2020 and earlier. CTA chest 05/01/2020.  FINDINGS: CTA CHEST FINDINGS Cardiovascular: Good contrast bolus timing in the pulmonary arterial tree. No focal filling defect identified in the pulmonary arteries today to suggest acute or residual pulmonary embolism. Minimal calcified coronary artery atherosclerosis is evident. Mild cardiomegaly. No pericardial effusion. Minimal plaque of the thoracic aorta. Mediastinum/Nodes: Negative.  No lymphadenopathy. Lungs/Pleura: Somewhat lower lung volumes compared to last year. Resolved pleural effusions and lower lobe collapse or consolidation. Mild mosaic attenuation and mild peribronchial streaky opacity in the lingula, favor a combination of atelectasis and mild gas trapping. Major airways are patent. No areas suspicious for acute respiratory infection. Musculoskeletal: Chronic severe T10-T11 disc and endplate degeneration. Chronic severe degeneration at the visible shoulders. No acute osseous abnormality identified. Review of the MIP images confirms the above findings. CT ABDOMEN and PELVIS FINDINGS Hepatobiliary: Chronic gallstones individually up to 16 mm. Continued indistinct appearance of the porta hepatis which appears mildly inflamed. But at the gallbladder fundus no wall thickening or pericholecystic inflammation is evident. No hyperenhancement at the gallbladder fossa. Stable liver parenchyma including tiny benign appearing low-density area at the dome. No bile duct enlargement. Pancreas: Abnormal partially calcified multi cystic appearing pancreatic head mass as described on the CT last month appears stable measuring 4.3 cm on series 4, image 37. No main pancreatic ductal dilatation. No superimposed acute pancreatitis suspected. However, along with the indistinct porta hepatis the gastric antrum and proximal duodenum continue to appear indistinct and inflamed. See coronal image 35 today. Compared to last month, the distal stomach and proximal duodenum appear mildly improved. No extraluminal gas or fluid  identified. Proximal stomach and distal duodenum appear within normal limits. Spleen: Negative. Adrenals/Urinary Tract: Adrenal glands remain normal. Stable kidneys with bilateral parapelvic and exophytic renal cystic lesions which appeared benign by MRI in August of last year. Symmetric renal contrast excretion with no hydronephrosis or hydroureter. Diminutive and unremarkable urinary bladder. Stable pelvic phleboliths. Stomach/Bowel: Sigmoid diverticulosis again noted. No active inflammation identified. Otherwise redundant large bowel. Retained stool in the right colon and at the hepatic flexure. Normal appendix visible on series 4, image 60. Decompressed and negative terminal ileum. No dilated small bowel. Stomach and duodenum described above. No free air. No free fluid. Vascular/Lymphatic: Major arterial structures remain patent with tortuosity but minimal atherosclerosis. Portal venous system is patent. No lymphadenopathy. Reproductive: Stable lobulated fibroid uterus. Other: No pelvic free fluid. Musculoskeletal: Stable advanced lumbar disc and facet degeneration. No acute osseous abnormality identified. Review of the MIP images confirms the above findings. IMPRESSION: CHEST: 1. Negative for acute or residual pulmonary embolus. 2. Mild scattered pulmonary atelectasis and gas trapping. No other acute process in the chest. ABDOMEN AND PELVIS: 1. Largely stable CT  appearance of the abdomen and pelvis since 10/15/2020 (please see also that report): 2. Stable cystic and partially calcified pancreatic head mass, 4.3 cm. 3. Slightly improved appearance of the gastric antrum, duodenal bulb, and porta hepatis from last month. As before this may reflect improving peptic ulcer disease. Underlying chronic cholelithiasis, without strong evidence of acute cholecystitis today. 4. Fibroid uterus. Multiple renal cysts which had a benign MRI appearance last yea. Sigmoid diverticulosis. Electronically Signed   By: Genevie Ann M.D.    On: 11/10/2020 06:10   US Abdomen Limited  Result Date: 11/15/2020 CLINICAL DATA:  Right upper quadrant abdominal pain. EXAM: ULTRASOUND ABDOMEN LIMITED RIGHT UPPER QUADRANT COMPARISON:  November 14, 2020.  October 18, 2020. FINDINGS: Gallbladder: Cholelithiasis is noted as well as some degree of sludge within the gallbladder lumen. No gallbladder wall thickening or pericholecystic fluid is noted. The absence or presence of sonographic Murphy's sign was not reported by the technologist. Common bile duct: Diameter: 2 mm which is within normal limits. Liver: No focal lesion identified. Within normal limits in parenchymal echogenicity. Portal vein is patent on color Doppler imaging with normal direction of blood flow towards the liver. Other: None. IMPRESSION: Cholelithiasis and gallbladder sludge is noted without gallbladder wall thickening or pericholecystic fluid. Electronically Signed   By: Marijo Conception M.D.   On: 11/15/2020 09:00   DG ABD ACUTE 2+V W 1V CHEST  Result Date: 11/11/2020 CLINICAL DATA:  65 year old female with persistent abdominal pain and vomiting. EXAM: DG ABDOMEN ACUTE WITH 1 VIEW CHEST COMPARISON:  CTA chest and CT Abdomen and Pelvis 11/10/2020, and earlier. FINDINGS: Portable AP upright view of the chest at 0724 hours. Lower lung volumes. No acute pulmonary opacity. Stable cardiac size and mediastinal contours. No pneumoperitoneum. Portable upright and supine views of the abdomen 0726 hours. Non obstructed bowel gas pattern. Cholelithiasis again noted. Pelvic vascular calcifications. No acute osseous abnormality identified. IMPRESSION: 1. Normal bowel gas pattern, no free air. Cholelithiasis again noted. 2. Lower lung volumes with no acute cardiopulmonary abnormality. Electronically Signed   By: Genevie Ann M.D.   On: 11/11/2020 07:54   DG Abdomen Acute W/Chest  Result Date: 11/01/2020 CLINICAL DATA:  Abdominal pain with black, tarry stools. EXAM: DG ABDOMEN ACUTE WITH 1 VIEW CHEST  COMPARISON:  October 18, 2020 FINDINGS: There is no evidence of dilated bowel loops or free intraperitoneal air. No radiopaque calculi are seen. A stable 1.1 cm soft tissue calcification is seen overlying the medial aspect of the mid to upper right abdomen. Heart size and mediastinal contours are within normal limits. Chronic appearing increased lung markings are seen without evidence of acute infiltrate. IMPRESSION: 1. No evidence of bowel obstruction or acute cardiopulmonary disease. 2. Stable soft tissue calcification within the abdomen which corresponds to the partially calcified pancreatic mass seen on prior studies. Electronically Signed   By: Virgina Norfolk M.D.   On: 11/01/2020 23:55   MM DIAG BREAST TOMO BILATERAL  Result Date: 11/12/2020 CLINICAL DATA:  65 year old female presenting for annual bilateral mammogram and 1 year follow-up of probably benign right breast calcifications. EXAM: DIGITAL DIAGNOSTIC BILATERAL MAMMOGRAM WITH TOMOSYNTHESIS AND CAD TECHNIQUE: Bilateral digital diagnostic mammography and breast tomosynthesis was performed. The images were evaluated with computer-aided detection. COMPARISON:  Previous exam(s). ACR Breast Density Category b: There are scattered areas of fibroglandular density. FINDINGS: A 9 mm group of coarse calcifications in the upper-outer right breast are mammographically stable. No new or suspicious mammographic findings are identified in the remainder  of either breast. IMPRESSION: 1. Stable, probably benign right breast calcifications. Recommendation is for a final mammographic follow-up in 1 year. 2. No mammographic evidence of malignancy on the left. RECOMMENDATION: Bilateral diagnostic mammogram in 1 year. I have discussed the findings and recommendations with the patient. If applicable, a reminder letter will be sent to the patient regarding the next appointment. BI-RADS CATEGORY  3: Probably benign. Electronically Signed   By: Kristopher Oppenheim M.D.   On:  11/12/2020 14:23   CT Renal Stone Study  Result Date: 11/14/2020 CLINICAL DATA:  Right flank pain EXAM: CT ABDOMEN AND PELVIS WITHOUT CONTRAST TECHNIQUE: Multidetector CT imaging of the abdomen and pelvis was performed following the standard protocol without IV contrast. COMPARISON:  11/10/2020 FINDINGS: Lower chest: Scarring in the lingula. Small hiatal hernia. No effusions. Hepatobiliary: Multiple gallstones within the gallbladder measuring up 2 17 mm. Small hypodensity in the right hepatic dome compatible with cyst. Pancreas: Partially calcified cystic mass again seen within the pancreatic head measures 4.2 cm, stable. No pancreatic ductal dilatation. Spleen: No focal abnormality.  Normal size. Adrenals/Urinary Tract: Multiple cystic lesions within the kidneys bilaterally. No hydronephrosis. Adrenal glands and urinary bladder unremarkable. Stomach/Bowel: Large stool burden throughout the colon. Normal appendix. Stomach is decompressed. Mildly prominent fluid-filled right abdominal small bowel loops. Terminal ileum is decompressed. Vascular/Lymphatic: No evidence of aneurysm or adenopathy. Reproductive: Fibroid uterus.  No adnexal mass. Other: No free fluid or free air. Musculoskeletal: No acute bony abnormality. IMPRESSION: Cholelithiasis. Stable partially calcified cystic mass within the pancreatic head. Stable bilateral renal cystic lesions. No renal or ureteral stones. No hydronephrosis. Large stool burden throughout the colon. Fibroid uterus. Electronically Signed   By: Rolm Baptise M.D.   On: 11/14/2020 23:55    SIGNED: Deatra James, MD, FHM. [facility],  Pager (please use [email or web address] to page/text) Please use Epic Secure Chat for non-urgent communication (7AM-7PM)  If 7PM-7AM, please contact night-coverage [email or web address], 11/24/2020, 11:05 AM

## 2020-11-25 ENCOUNTER — Ambulatory Visit (HOSPITAL_COMMUNITY): Payer: Medicaid Other | Admitting: Physical Therapy

## 2020-11-25 ENCOUNTER — Ambulatory Visit (HOSPITAL_COMMUNITY): Payer: Medicaid Other | Attending: Family Medicine | Admitting: Physical Therapy

## 2020-11-25 DIAGNOSIS — K869 Disease of pancreas, unspecified: Secondary | ICD-10-CM | POA: Diagnosis not present

## 2020-11-25 DIAGNOSIS — M25561 Pain in right knee: Secondary | ICD-10-CM | POA: Insufficient documentation

## 2020-11-25 DIAGNOSIS — R2689 Other abnormalities of gait and mobility: Secondary | ICD-10-CM | POA: Insufficient documentation

## 2020-11-25 DIAGNOSIS — R29898 Other symptoms and signs involving the musculoskeletal system: Secondary | ICD-10-CM | POA: Insufficient documentation

## 2020-11-25 DIAGNOSIS — K277 Chronic peptic ulcer, site unspecified, without hemorrhage or perforation: Secondary | ICD-10-CM

## 2020-11-25 DIAGNOSIS — K219 Gastro-esophageal reflux disease without esophagitis: Secondary | ICD-10-CM

## 2020-11-25 DIAGNOSIS — K8020 Calculus of gallbladder without cholecystitis without obstruction: Secondary | ICD-10-CM | POA: Diagnosis not present

## 2020-11-25 DIAGNOSIS — M25662 Stiffness of left knee, not elsewhere classified: Secondary | ICD-10-CM | POA: Insufficient documentation

## 2020-11-25 DIAGNOSIS — M6281 Muscle weakness (generalized): Secondary | ICD-10-CM | POA: Insufficient documentation

## 2020-11-25 DIAGNOSIS — R1010 Upper abdominal pain, unspecified: Secondary | ICD-10-CM | POA: Diagnosis not present

## 2020-11-25 DIAGNOSIS — M25661 Stiffness of right knee, not elsewhere classified: Secondary | ICD-10-CM | POA: Insufficient documentation

## 2020-11-25 DIAGNOSIS — M25562 Pain in left knee: Secondary | ICD-10-CM | POA: Insufficient documentation

## 2020-11-25 LAB — BASIC METABOLIC PANEL
Anion gap: 9 (ref 5–15)
BUN: 6 mg/dL — ABNORMAL LOW (ref 8–23)
CO2: 22 mmol/L (ref 22–32)
Calcium: 8.5 mg/dL — ABNORMAL LOW (ref 8.9–10.3)
Chloride: 113 mmol/L — ABNORMAL HIGH (ref 98–111)
Creatinine, Ser: 0.65 mg/dL (ref 0.44–1.00)
GFR, Estimated: >60 mL/min (ref >60)
Glucose, Bld: 90 mg/dL (ref 70–99)
Potassium: 3.1 mmol/L — ABNORMAL LOW (ref 3.5–5.1)
Sodium: 144 mmol/L (ref 135–145)

## 2020-11-25 MED ORDER — POTASSIUM CHLORIDE CRYS ER 20 MEQ PO TBCR
40.0000 meq | EXTENDED_RELEASE_TABLET | Freq: Once | ORAL | Status: AC
  Administered 2020-11-25: 40 meq via ORAL
  Filled 2020-11-25: qty 2

## 2020-11-25 MED ORDER — PANTOPRAZOLE SODIUM 40 MG IV SOLR
40.0000 mg | Freq: Two times a day (BID) | INTRAVENOUS | Status: DC
  Administered 2020-11-25: 40 mg via INTRAVENOUS
  Filled 2020-11-25 (×2): qty 40

## 2020-11-25 MED ORDER — SUCRALFATE 1 GM/10ML PO SUSP
1.0000 g | Freq: Three times a day (TID) | ORAL | Status: DC
  Administered 2020-11-25 – 2020-11-28 (×12): 1 g via ORAL
  Filled 2020-11-25 (×12): qty 10

## 2020-11-25 NOTE — Progress Notes (Signed)
PROGRESS NOTE    Patient: Maria Murphy                            PCP: Maria Pier, MD                    DOB: 11/03/1955            DOA: 11/22/2020 ATF:[id number]             DOS: 11/25/2020, 12:32 PM   LOS: 2 days   Date of Service: The patient was seen and examined on 11/25/2020  Subjective:   The patient was seen and examined this morning, stable still complaining of abdominal discomfort, pain Patient has been on clear liquid diet she tolerated some postprandial complaining of pain.   Brief Narrative:  Maria Murphy  is a 65 y.o. female, with history of PE, PUD, HTN, GERD, and more presents to the ED with a chief complaint of abdominal pain. The pain started at 5pm 11/22/20 in her right lower quadrant. Patient reports that she drank 8oz of wate  r at once, and that is when the pain started. At first it was 10/10. Now it is 5/10. She reports that she had 5-6 episodes of vomiting as well. Her last normal BM was the same day the pain started.She reports that the pain was constant and sharp until she received pain medications in the ED. did feel like her normal abdominal pain.  She felt associated flushing as well.  She denies any fever or chills.  She denies dysuria, hematemesis, hematochezia, and melena.  Patient has no other complaints at this time.  Does not smoke, drinks wine daily with meals, and does not use illicit drugs.  Patient is full code.  Patient was last seen by GI earlier this month for hospital follow-up.  They noted that she has a history of peptic ulcer disease that was complicated by perforation requiring a Phillip Heal patch.  She is also had a chronic pancreatic lesion that they mention is concerning for malignancy.  She complained of abdominal pain at that visit as well, which they attribute to known ulcers.  They have advised her to strictly avoid NSAIDs.  She is advised to keep her appointment with Dr. Rush Landmark regarding her lesion of her pancreas, and to follow-up  with GI in 2 months.  In the ED Temp 98.7, heart rate 100, respiratory rate 22, blood pressure 164/105, satting 100% No leukocytosis with a white blood cell count of 8.9, hemoglobin 9.8 Chemistry panel reveals a hypokalemia at 3.4 CT abdomen shows negative for acute appendicitis, gallstones.  No biliary dilatation.  Stable 4.1 cm cystic lesion and calcification in the head of the pancreas.  Lastly fibroid uterus.  Patient was given a 500 bolus in the ED, Zofran, Protonix, Dilaudid    Assessment & Plan:   Principal Problem:   Abdominal pain Active Problems:   Essential hypertension   GERD (gastroesophageal reflux disease)   Gallstones   Hypokalemia   Chronic peptic ulcer   Pancreatic lesion  Abdominal pain Abdominal pain with history of nonobstructive cholelithiasis, peptic ulcer disease-chronic -Still reporting abdominal pain this morning initiated on clear liquid diet tolerating some  -Surgery following closely-abdominal discomfort, due to chronic ulcer has been going on for some time is now, Dr. Eber Jones reaching out to Dr.Dr. Alonna Minium at [facility] for an opinion... Possibly referring to [facility]--for possibility of enterectomy, vagectomy foot  ulcer disease.Marland Kitchen And work-up for pancreatic lesion (Whipple procedure)   -GI consulted, seen evaluate patient: Reporting chronic issue large duodenal ulcer recommended  Meanwhile continue PPI, pain control,.. Recommending referral for possible Whipple procedure  -General surgery-following closely appreciate all help, assistance and input -Continuing IV fluids.. Be reduced subsequently discontinued -As needed analgesics, morphine -As needed antiemetics -CT abdomen pelvis reviewed: No signs of pancreatitis, gallstones with no obstruction, 4.1 cm cystic lesion at the head of pancreas, fibroid uterus,  -We will continue to monitor  Chronic cryptic ulcer disease -Management as above, continue PPI, Carafate, -GI and surgery  following closely, planning discussion with surgery oncology, possible referral versus transfer to tertiary care [facility]  Cholelithiasis, - nonobstructive, monitoring, general surgery following, no intervention at this time  Hypokalemia -monitoring and repleting, checking magnesium  Hypertension-stable, will continue home medication of amlodipine  History of pulmonary embolism-on Xarelto, will continue at this time  GERD -currently on IV Protonix 40 daily twice daily  Depression-stable continue mirtazapine, Neurontin, Celexa,    ---------------------------------------------------------------------------------------------------------------------------------------- Nutritional status:  The patient's BMI is: Body mass index is 30.04 kg/m. I agree with the assessment and plan as outlined ---------------------------------------------------------------------------------------------------------------------------------------- Cultures; none Antimicrobials: None    Consultants: Gastroenterologist/general surgery   ----------------------------------------------------------------------------------------------------------------------------------------  DVT prophylaxis:  SCD/Compression stockings and VH:QIONGEX  Code Status:   Code Status: Full Code  Family Communication: Cussed with patient  No family member present at bedside- The above findings and plan of care has been discussed with patient  in detail,  they expressed understanding and agreement of above. -Advance care planning has been discussed.   Admission status:   Status is: Observation  The patient remains OBS appropriate and will d/c before 2 midnights.  Dispo: The patient is from: Home              Anticipated d/c is to: Home in 1-2 days               Patient currently is not medically stable to d/c.   Difficult to place patient No      Level of care: Med-Surg   Procedures:   No admission procedures for  hospital encounter.     Antimicrobials:  Anti-infectives (From admission, onward)   None       Medication:  . amLODipine  2.5 mg Oral Daily  . citalopram  20 mg Oral Daily  . donepezil  10 mg Oral QHS  . famotidine  20 mg Oral BID  . gabapentin  300 mg Oral BID  . mirtazapine  15 mg Oral QHS  . pantoprazole (PROTONIX) IV  40 mg Intravenous Q12H  . rivaroxaban  10 mg Oral QPM  . traZODone  100 mg Oral QHS    acetaminophen **OR** acetaminophen, morphine injection, ondansetron **OR** ondansetron (ZOFRAN) IV   Objective:   Vitals:   11/24/20 1335 11/24/20 2116 11/25/20 0529 11/25/20 0810  BP: (!) 146/71 137/64 (!) 165/68 (!) 161/73  Pulse: (!) 51 (!) 51 (!) 44   Resp: 14 18 18    Temp: 98 F (36.7 C) 98.2 F (36.8 C) 97.6 F (36.4 C)   TempSrc: Oral Oral Oral   SpO2: 96% 96% 97%   Weight:      Height:        Intake/Output Summary (Last 24 hours) at 11/25/2020 1232 Last data filed at 11/25/2020 0900 Gross per 24 hour  Intake 1080 ml  Output 900 ml  Net 180 ml   Filed Weights   11/22/20 2204  Weight:  79.4 kg     Examination:       Physical Exam:   General:  Alert, oriented, cooperative, waning abdominal discomfort  HEENT:  Normocephalic, PERRL, otherwise with in Normal limits   Neuro:  CNII-XII intact. , normal motor and sensation, reflexes intact   Lungs:   Clear to auscultation BL, Respirations unlabored, no wheezes / crackles  Cardio:    S1/S2, RRR, No murmure, No Rubs or Gallops   Abdomen:   Soft, diffuse abdominal tenderness especially in right upper quadrant positive rebound tenderness, bowel sounds active all four quadrants,  no guarding or peritoneal signs.  Muscular skeletal:  Limited exam - in bed, able to move all 4 extremities, Normal strength,  2+ pulses,  symmetric, No pitting edema  Skin:  Dry, warm to touch, negative for any Rashes,  Wounds: Please see nursing documentation         -------------------------------------------------------------------------------------------------------------------    LABs:  CBC Latest Ref Rng & Units 11/23/2020 11/22/2020 11/17/2020  WBC 4.0 - 10.5 K/uL 6.0 8.9 4.1  Hemoglobin 12.0 - 15.0 g/dL 8.6(L) 9.8(L) 9.1(L)  Hematocrit 36.0 - 46.0 % 26.8(L) 30.8(L) 29.0(L)  Platelets 150 - 400 K/uL 175 214 188   CMP Latest Ref Rng & Units 11/25/2020 11/24/2020 11/23/2020  Glucose 70 - 99 mg/dL 90 82 98  BUN 8 - 23 mg/dL 6(L) 7(L) 10  Creatinine 0.44 - 1.00 mg/dL 0.65 0.65 0.61  Sodium 135 - 145 mmol/L 144 142 140  Potassium 3.5 - 5.1 mmol/L 3.1(L) 3.2(L) 3.3(L)  Chloride 98 - 111 mmol/L 113(H) 112(H) 108  CO2 22 - 32 mmol/L 22 23 23   Calcium 8.9 - 10.3 mg/dL 8.5(L) 8.3(L) 8.2(L)  Total Protein 6.5 - 8.1 g/dL - 5.8(L) 5.5(L)  Total Bilirubin 0.3 - 1.2 mg/dL - 0.6 0.4  Alkaline Phos 38 - 126 U/L - 39 39  AST 15 - 41 U/L - 15 13(L)  ALT 0 - 44 U/L - 7 7       Micro Results Recent Results (from the past 240 hour(s))  Culture, Urine     Status: Abnormal   Collection Time: 11/15/20  3:00 PM   Specimen: Urine, Clean Catch  Result Value Ref Range Status   Specimen Description   Final    URINE, CLEAN CATCH Performed at [facility], [address], [place], [place] [address]    Special Requests   Final    NONE Performed at [facility], [address] [place], [place] [address]    Culture (A)  Final    40,000 COLONIES/mL LACTOBACILLUS SPECIES Standardized susceptibility testing for this organism is not available. Performed at [facility], [place] [address] [place], [place] [address]    Report Status 11/17/2020 FINAL  Final  SARS CORONAVIRUS 2 (TAT 6-24 HRS) Nasopharyngeal Nasopharyngeal Swab     Status: None   Collection Time: 11/23/20 12:06 AM   Specimen: Nasopharyngeal Swab  Result Value Ref Range Status   SARS Coronavirus 2 NEGATIVE NEGATIVE Final    Comment: (NOTE) SARS-CoV-2 target nucleic acids are NOT DETECTED.  The  SARS-CoV-2 RNA is generally detectable in upper and lower respiratory specimens during the acute phase of infection. Negative results do not preclude SARS-CoV-2 infection, do not rule out co-infections with other pathogens, and should not be used as the sole basis for treatment or other patient management decisions. Negative results must be combined with clinical observations, patient history, and epidemiological information. The expected result is Negative.  Fact Sheet for  Patients: SugarRoll.be  Fact Sheet for Healthcare Providers: https://www.woods-mathews.com/  This test is not yet approved or cleared by the [place] FDA and  has been authorized for detection and/or diagnosis of SARS-CoV-2 by FDA under an Emergency Use Authorization (EUA). This EUA will remain  in effect (meaning this test can be used) for the duration of the COVID-19 declaration under Se ction 564(b)(1) of the Act, 21 U.S.C. section 360bbb-3(b)(1), unless the authorization is terminated or revoked sooner.  Performed at [facility], [place] [address] [place], [place] [address]     Radiology Reports CT ABDOMEN PELVIS WO CONTRAST  Result Date: 11/22/2020 CLINICAL DATA:  Right lower quadrant pain EXAM: CT ABDOMEN AND PELVIS WITHOUT CONTRAST TECHNIQUE: Multidetector CT imaging of the abdomen and pelvis was performed following the standard protocol without IV contrast. COMPARISON:  Ultrasound 01/15/2021, CT 11/14/2020, 11/10/2020, 04/20/2020 FINDINGS: Lower chest: Lung bases demonstrate no acute consolidation or effusion. Linear atelectasis or scarring at the lingula. Mild cardiomegaly. Small hiatal hernia. Hepatobiliary: Stable subcentimeter hypodensity in the liver, too small to further characterize. Multiple calcified gallstones. No biliary dilatation. Pancreas: No definite acute inflammatory change. Calcifications at the head of pancreas. Cystic lesion measuring 4.1  by 2.9 cm, previously 4.2 cm. Spleen: Normal in size without focal abnormality. Adrenals/Urinary Tract: Adrenal glands within normal limits. Cysts within the kidneys. Rim calcified cyst exophytic upper pole right kidney without change. No hydronephrosis. The bladder is normal Stomach/Bowel: Stomach is within normal limits. Appendix appears normal. No evidence of bowel wall thickening, distention, or inflammatory changes. Vascular/Lymphatic: Nonaneurysmal aorta. Mild aortic atherosclerosis. No suspicious nodes Reproductive: Multiple uterine masses consistent with fibroids. Other: Negative for free air or free fluid Musculoskeletal: Grade 1 anterolisthesis L4 on L5. Degenerative changes of the lumbar spine. No acute osseous abnormality IMPRESSION: 1. Negative for acute appendicitis. 2. Gallstones. No biliary dilatation. 3. Stable 4.1 cm cystic lesion and calcification within the head of the pancreas. 4. Fibroid uterus. Aortic Atherosclerosis (ICD10-I70.0). Electronically Signed   By: Donavan Foil M.D.   On: 11/22/2020 23:17   CT Angio Chest PE W and/or Wo Contrast  Result Date: 11/10/2020 CLINICAL DATA:  65 year old female with right rib and flank pain. History of PE in September 2021. Abdominal pain with black tarry stools. History of cholelithiasis. History of exploratory laparotomy, perforated gastric ulcer in August 2021. EXAM: CT ANGIOGRAPHY CHEST CT ABDOMEN AND PELVIS WITH CONTRAST TECHNIQUE: Multidetector CT imaging of the chest was performed using the standard protocol during bolus administration of intravenous contrast. Multiplanar CT image reconstructions and MIPs were obtained to evaluate the vascular anatomy. Multidetector CT imaging of the abdomen and pelvis was performed using the standard protocol during bolus administration of intravenous contrast. CONTRAST:  155mL OMNIPAQUE IOHEXOL 350 MG/ML SOLN COMPARISON:  CT Abdomen and Pelvis 10/15/2020 and earlier. CTA chest 05/01/2020. FINDINGS: CTA CHEST  FINDINGS Cardiovascular: Good contrast bolus timing in the pulmonary arterial tree. No focal filling defect identified in the pulmonary arteries today to suggest acute or residual pulmonary embolism. Minimal calcified coronary artery atherosclerosis is evident. Mild cardiomegaly. No pericardial effusion. Minimal plaque of the thoracic aorta. Mediastinum/Nodes: Negative.  No lymphadenopathy. Lungs/Pleura: Somewhat lower lung volumes compared to last year. Resolved pleural effusions and lower lobe collapse or consolidation. Mild mosaic attenuation and mild peribronchial streaky opacity in the lingula, favor a combination of atelectasis and mild gas trapping. Major airways are patent. No areas suspicious for acute respiratory infection. Musculoskeletal: Chronic severe T10-T11 disc and endplate degeneration. Chronic severe degeneration  at the visible shoulders. No acute osseous abnormality identified. Review of the MIP images confirms the above findings. CT ABDOMEN and PELVIS FINDINGS Hepatobiliary: Chronic gallstones individually up to 16 mm. Continued indistinct appearance of the porta hepatis which appears mildly inflamed. But at the gallbladder fundus no wall thickening or pericholecystic inflammation is evident. No hyperenhancement at the gallbladder fossa. Stable liver parenchyma including tiny benign appearing low-density area at the dome. No bile duct enlargement. Pancreas: Abnormal partially calcified multi cystic appearing pancreatic head mass as described on the CT last month appears stable measuring 4.3 cm on series 4, image 37. No main pancreatic ductal dilatation. No superimposed acute pancreatitis suspected. However, along with the indistinct porta hepatis the gastric antrum and proximal duodenum continue to appear indistinct and inflamed. See coronal image 35 today. Compared to last month, the distal stomach and proximal duodenum appear mildly improved. No extraluminal gas or fluid identified. Proximal  stomach and distal duodenum appear within normal limits. Spleen: Negative. Adrenals/Urinary Tract: Adrenal glands remain normal. Stable kidneys with bilateral parapelvic and exophytic renal cystic lesions which appeared benign by MRI in August of last year. Symmetric renal contrast excretion with no hydronephrosis or hydroureter. Diminutive and unremarkable urinary bladder. Stable pelvic phleboliths. Stomach/Bowel: Sigmoid diverticulosis again noted. No active inflammation identified. Otherwise redundant large bowel. Retained stool in the right colon and at the hepatic flexure. Normal appendix visible on series 4, image 60. Decompressed and negative terminal ileum. No dilated small bowel. Stomach and duodenum described above. No free air. No free fluid. Vascular/Lymphatic: Major arterial structures remain patent with tortuosity but minimal atherosclerosis. Portal venous system is patent. No lymphadenopathy. Reproductive: Stable lobulated fibroid uterus. Other: No pelvic free fluid. Musculoskeletal: Stable advanced lumbar disc and facet degeneration. No acute osseous abnormality identified. Review of the MIP images confirms the above findings. IMPRESSION: CHEST: 1. Negative for acute or residual pulmonary embolus. 2. Mild scattered pulmonary atelectasis and gas trapping. No other acute process in the chest. ABDOMEN AND PELVIS: 1. Largely stable CT appearance of the abdomen and pelvis since 10/15/2020 (please see also that report): 2. Stable cystic and partially calcified pancreatic head mass, 4.3 cm. 3. Slightly improved appearance of the gastric antrum, duodenal bulb, and porta hepatis from last month. As before this may reflect improving peptic ulcer disease. Underlying chronic cholelithiasis, without strong evidence of acute cholecystitis today. 4. Fibroid uterus. Multiple renal cysts which had a benign MRI appearance last yea. Sigmoid diverticulosis. Electronically Signed   By: Genevie Ann M.D.   On: 11/10/2020 06:10    CT ABDOMEN PELVIS W CONTRAST  Result Date: 11/10/2020 CLINICAL DATA:  65 year old female with right rib and flank pain. History of PE in September 2021. Abdominal pain with black tarry stools. History of cholelithiasis. History of exploratory laparotomy, perforated gastric ulcer in August 2021. EXAM: CT ANGIOGRAPHY CHEST CT ABDOMEN AND PELVIS WITH CONTRAST TECHNIQUE: Multidetector CT imaging of the chest was performed using the standard protocol during bolus administration of intravenous contrast. Multiplanar CT image reconstructions and MIPs were obtained to evaluate the vascular anatomy. Multidetector CT imaging of the abdomen and pelvis was performed using the standard protocol during bolus administration of intravenous contrast. CONTRAST:  145mL OMNIPAQUE IOHEXOL 350 MG/ML SOLN COMPARISON:  CT Abdomen and Pelvis 10/15/2020 and earlier. CTA chest 05/01/2020. FINDINGS: CTA CHEST FINDINGS Cardiovascular: Good contrast bolus timing in the pulmonary arterial tree. No focal filling defect identified in the pulmonary arteries today to suggest acute or residual pulmonary embolism. Minimal calcified coronary artery  atherosclerosis is evident. Mild cardiomegaly. No pericardial effusion. Minimal plaque of the thoracic aorta. Mediastinum/Nodes: Negative.  No lymphadenopathy. Lungs/Pleura: Somewhat lower lung volumes compared to last year. Resolved pleural effusions and lower lobe collapse or consolidation. Mild mosaic attenuation and mild peribronchial streaky opacity in the lingula, favor a combination of atelectasis and mild gas trapping. Major airways are patent. No areas suspicious for acute respiratory infection. Musculoskeletal: Chronic severe T10-T11 disc and endplate degeneration. Chronic severe degeneration at the visible shoulders. No acute osseous abnormality identified. Review of the MIP images confirms the above findings. CT ABDOMEN and PELVIS FINDINGS Hepatobiliary: Chronic gallstones individually up to  16 mm. Continued indistinct appearance of the porta hepatis which appears mildly inflamed. But at the gallbladder fundus no wall thickening or pericholecystic inflammation is evident. No hyperenhancement at the gallbladder fossa. Stable liver parenchyma including tiny benign appearing low-density area at the dome. No bile duct enlargement. Pancreas: Abnormal partially calcified multi cystic appearing pancreatic head mass as described on the CT last month appears stable measuring 4.3 cm on series 4, image 37. No main pancreatic ductal dilatation. No superimposed acute pancreatitis suspected. However, along with the indistinct porta hepatis the gastric antrum and proximal duodenum continue to appear indistinct and inflamed. See coronal image 35 today. Compared to last month, the distal stomach and proximal duodenum appear mildly improved. No extraluminal gas or fluid identified. Proximal stomach and distal duodenum appear within normal limits. Spleen: Negative. Adrenals/Urinary Tract: Adrenal glands remain normal. Stable kidneys with bilateral parapelvic and exophytic renal cystic lesions which appeared benign by MRI in August of last year. Symmetric renal contrast excretion with no hydronephrosis or hydroureter. Diminutive and unremarkable urinary bladder. Stable pelvic phleboliths. Stomach/Bowel: Sigmoid diverticulosis again noted. No active inflammation identified. Otherwise redundant large bowel. Retained stool in the right colon and at the hepatic flexure. Normal appendix visible on series 4, image 60. Decompressed and negative terminal ileum. No dilated small bowel. Stomach and duodenum described above. No free air. No free fluid. Vascular/Lymphatic: Major arterial structures remain patent with tortuosity but minimal atherosclerosis. Portal venous system is patent. No lymphadenopathy. Reproductive: Stable lobulated fibroid uterus. Other: No pelvic free fluid. Musculoskeletal: Stable advanced lumbar disc and  facet degeneration. No acute osseous abnormality identified. Review of the MIP images confirms the above findings. IMPRESSION: CHEST: 1. Negative for acute or residual pulmonary embolus. 2. Mild scattered pulmonary atelectasis and gas trapping. No other acute process in the chest. ABDOMEN AND PELVIS: 1. Largely stable CT appearance of the abdomen and pelvis since 10/15/2020 (please see also that report): 2. Stable cystic and partially calcified pancreatic head mass, 4.3 cm. 3. Slightly improved appearance of the gastric antrum, duodenal bulb, and porta hepatis from last month. As before this may reflect improving peptic ulcer disease. Underlying chronic cholelithiasis, without strong evidence of acute cholecystitis today. 4. Fibroid uterus. Multiple renal cysts which had a benign MRI appearance last yea. Sigmoid diverticulosis. Electronically Signed   By: Genevie Ann M.D.   On: 11/10/2020 06:10   US Abdomen Limited  Result Date: 11/15/2020 CLINICAL DATA:  Right upper quadrant abdominal pain. EXAM: ULTRASOUND ABDOMEN LIMITED RIGHT UPPER QUADRANT COMPARISON:  November 14, 2020.  October 18, 2020. FINDINGS: Gallbladder: Cholelithiasis is noted as well as some degree of sludge within the gallbladder lumen. No gallbladder wall thickening or pericholecystic fluid is noted. The absence or presence of sonographic Murphy's sign was not reported by the technologist. Common bile duct: Diameter: 2 mm which is within normal limits. Liver: No  focal lesion identified. Within normal limits in parenchymal echogenicity. Portal vein is patent on color Doppler imaging with normal direction of blood flow towards the liver. Other: None. IMPRESSION: Cholelithiasis and gallbladder sludge is noted without gallbladder wall thickening or pericholecystic fluid. Electronically Signed   By: Marijo Conception M.D.   On: 11/15/2020 09:00   DG ABD ACUTE 2+V W 1V CHEST  Result Date: 11/11/2020 CLINICAL DATA:  65 year old female with persistent  abdominal pain and vomiting. EXAM: DG ABDOMEN ACUTE WITH 1 VIEW CHEST COMPARISON:  CTA chest and CT Abdomen and Pelvis 11/10/2020, and earlier. FINDINGS: Portable AP upright view of the chest at 0724 hours. Lower lung volumes. No acute pulmonary opacity. Stable cardiac size and mediastinal contours. No pneumoperitoneum. Portable upright and supine views of the abdomen 0726 hours. Non obstructed bowel gas pattern. Cholelithiasis again noted. Pelvic vascular calcifications. No acute osseous abnormality identified. IMPRESSION: 1. Normal bowel gas pattern, no free air. Cholelithiasis again noted. 2. Lower lung volumes with no acute cardiopulmonary abnormality. Electronically Signed   By: Genevie Ann M.D.   On: 11/11/2020 07:54   DG Abdomen Acute W/Chest  Result Date: 11/01/2020 CLINICAL DATA:  Abdominal pain with black, tarry stools. EXAM: DG ABDOMEN ACUTE WITH 1 VIEW CHEST COMPARISON:  October 18, 2020 FINDINGS: There is no evidence of dilated bowel loops or free intraperitoneal air. No radiopaque calculi are seen. A stable 1.1 cm soft tissue calcification is seen overlying the medial aspect of the mid to upper right abdomen. Heart size and mediastinal contours are within normal limits. Chronic appearing increased lung markings are seen without evidence of acute infiltrate. IMPRESSION: 1. No evidence of bowel obstruction or acute cardiopulmonary disease. 2. Stable soft tissue calcification within the abdomen which corresponds to the partially calcified pancreatic mass seen on prior studies. Electronically Signed   By: Virgina Norfolk M.D.   On: 11/01/2020 23:55   MM DIAG BREAST TOMO BILATERAL  Result Date: 11/12/2020 CLINICAL DATA:  65 year old female presenting for annual bilateral mammogram and 1 year follow-up of probably benign right breast calcifications. EXAM: DIGITAL DIAGNOSTIC BILATERAL MAMMOGRAM WITH TOMOSYNTHESIS AND CAD TECHNIQUE: Bilateral digital diagnostic mammography and breast tomosynthesis was  performed. The images were evaluated with computer-aided detection. COMPARISON:  Previous exam(s). ACR Breast Density Category b: There are scattered areas of fibroglandular density. FINDINGS: A 9 mm group of coarse calcifications in the upper-outer right breast are mammographically stable. No new or suspicious mammographic findings are identified in the remainder of either breast. IMPRESSION: 1. Stable, probably benign right breast calcifications. Recommendation is for a final mammographic follow-up in 1 year. 2. No mammographic evidence of malignancy on the left. RECOMMENDATION: Bilateral diagnostic mammogram in 1 year. I have discussed the findings and recommendations with the patient. If applicable, a reminder letter will be sent to the patient regarding the next appointment. BI-RADS CATEGORY  3: Probably benign. Electronically Signed   By: Kristopher Oppenheim M.D.   On: 11/12/2020 14:23   CT Renal Stone Study  Result Date: 11/14/2020 CLINICAL DATA:  Right flank pain EXAM: CT ABDOMEN AND PELVIS WITHOUT CONTRAST TECHNIQUE: Multidetector CT imaging of the abdomen and pelvis was performed following the standard protocol without IV contrast. COMPARISON:  11/10/2020 FINDINGS: Lower chest: Scarring in the lingula. Small hiatal hernia. No effusions. Hepatobiliary: Multiple gallstones within the gallbladder measuring up 2 17 mm. Small hypodensity in the right hepatic dome compatible with cyst. Pancreas: Partially calcified cystic mass again seen within the pancreatic head measures 4.2 cm, stable.  No pancreatic ductal dilatation. Spleen: No focal abnormality.  Normal size. Adrenals/Urinary Tract: Multiple cystic lesions within the kidneys bilaterally. No hydronephrosis. Adrenal glands and urinary bladder unremarkable. Stomach/Bowel: Large stool burden throughout the colon. Normal appendix. Stomach is decompressed. Mildly prominent fluid-filled right abdominal small bowel loops. Terminal ileum is decompressed.  Vascular/Lymphatic: No evidence of aneurysm or adenopathy. Reproductive: Fibroid uterus.  No adnexal mass. Other: No free fluid or free air. Musculoskeletal: No acute bony abnormality. IMPRESSION: Cholelithiasis. Stable partially calcified cystic mass within the pancreatic head. Stable bilateral renal cystic lesions. No renal or ureteral stones. No hydronephrosis. Large stool burden throughout the colon. Fibroid uterus. Electronically Signed   By: Rolm Baptise M.D.   On: 11/14/2020 23:55    SIGNED: Deatra James, MD, FHM. [facility],  Pager (please use [email or web address] to page/text) Please use Epic Secure Chat for non-urgent communication (7AM-7PM)  If 7PM-7AM, please contact night-coverage [email or web address], 11/25/2020, 12:32 PM

## 2020-11-25 NOTE — Progress Notes (Signed)
[facility] Progress Note     Subjective: Pain somewhat better. Needing some dilaudid too.   Objective: Vital signs in last 24 hours: Temp:  [97.6 F (36.4 C)-98.2 F (36.8 C)] 97.6 F (36.4 C) (04/04 0529) Pulse Rate:  [44-51] 44 (04/04 0529) Resp:  [14-18] 18 (04/04 0529) BP: (137-165)/(64-73) 161/73 (04/04 0810) SpO2:  [96 %-97 %] 97 % (04/04 0529) Last BM Date: 11/22/20  Intake/Output from previous day: 04/03 0701 - 04/04 0700 In: 1080 [P.O.:1080] Out: 1100 [Urine:1100] Intake/Output this shift: Total I/O In: 240 [P.O.:240] Out: -   General appearance: alert, cooperative and no distress GI: soft, tender right abdomen/ epigastric  Lab Results:  Recent Labs    11/22/20 2206 11/23/20 0607  WBC 8.9 6.0  HGB 9.8* 8.6*  HCT 30.8* 26.8*  PLT 214 175   BMET Recent Labs    11/24/20 0837 11/25/20 0508  NA 142 144  K 3.2* 3.1*  CL 112* 113*  CO2 23 22  GLUCOSE 82 90  BUN 7* 6*  CREATININE 0.65 0.65  CALCIUM 8.3* 8.5*    Assessment/Plan: Maria Murphy is a 65 yo with chronic refractory peptic ulcer disease (unhealed since 03/2020) s/p graham patch for perforation 09/7033, 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    Discussed with Dr. Arnoldo Morale who has also helped care for her and agrees referral prob best at this time as she is not only going to be limited to a cholecystectomy and will likely need antrectomy/ vagotomy for ulcer disease and further workup for pancreatic lesion +/- surgery for that as well which all could be done at the same time.  I have reached out to Dr. Alonna Minium at [facility] for Surg Oncology expertise and help, and if she does not think referral is appropriate will refer to [place] as tolerated PRN for pain No surgical intervention planned this Admission    LOS: 2 days    Virl Cagey 11/25/2020

## 2020-11-25 NOTE — Progress Notes (Signed)
    Subjective: Pain improved from when first admitted but still present postprandially right-sided. No N/V today. Prior to admission, she had eaten cream of mushroom soup and felt ok, then states her sister told her to drink 8 ounces of water thereafter, which is when pain started. No overt GI bleeding. Clear liquids this morning, small amount. Requesting pain medication stronger than tylenol.   Objective: Vital signs in last 24 hours: Temp:  [97.6 F (36.4 C)-98.2 F (36.8 C)] 97.6 F (36.4 C) (04/04 0529) Pulse Rate:  [44-51] 44 (04/04 0529) Resp:  [14-18] 18 (04/04 0529) BP: (137-165)/(64-73) 161/73 (04/04 0810) SpO2:  [96 %-97 %] 97 % (04/04 0529) Last BM Date: 11/22/20 General:   Alert and oriented, pleasant, no distress Head:  Normocephalic and atraumatic. Eyes:  No icterus, sclera clear. Conjuctiva pink.  Abdomen:  Bowel sounds present, soft, TTP lower abdomen and right-sided abdomen RUQ/RLQ Msk:  Symmetrical without gross deformities. Normal posture. Neurologic:  Alert and  oriented x4   Intake/Output from previous day: 04/03 0701 - 04/04 0700 In: 1080 [P.O.:1080] Out: 1100 [Urine:1100] Intake/Output this shift: No intake/output data recorded.  Lab Results: Recent Labs    11/22/20 2206 11/23/20 0607  WBC 8.9 6.0  HGB 9.8* 8.6*  HCT 30.8* 26.8*  PLT 214 175   BMET Recent Labs    11/23/20 0607 11/24/20 0837 11/25/20 0508  NA 140 142 144  K 3.3* 3.2* 3.1*  CL 108 112* 113*  CO2 23 23 22   GLUCOSE 98 82 90  BUN 10 7* 6*  CREATININE 0.61 0.65 0.65  CALCIUM 8.2* 8.3* 8.5*   LFT Recent Labs    11/22/20 2206 11/23/20 0607 11/24/20 0837  PROT 7.3 5.5* 5.8*  ALBUMIN 4.0 3.1* 3.1*  AST 22 13* 15  ALT 8 7 7   ALKPHOS 51 39 59  BILITOT 0.4 0.4 0.6   Assessment: 65 year old female with history of perforated peptic ulcer in August 2021s/p Graham patch, known chronic cholelithiasis, pancreatic mass, history of PE in Sept 2021, and multiple admissions with  persistent abdominal pain, N/V, admitted again with acute on chronic abdominal pain, N/V.   In interim from last admission in March 2022, she underwent the EGD/EUS with Dr. 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 Cystic lesion in pancreatic head but entire head of pancreas or cystic region couldn't be evaluated due to large, still healing duodenal ulcer as scope could only partially traverse into the duodenal bulb. Repeat EUS attempt in 2-3 months.   Surgery following again this admission but no plans for cholecystectomy at this time. Clinically, she has improved with abdominal pain severity since admission, but she continues with postprandial right-sided abdominal pain now.   Challenging scenario. May need tertiary referral.    Plan: Continue clear liquids for now PPI BID EUS in 3 months Unclear if MRI/MRCP would be helpful in further characterizing pancreatic lesion to help guide plan of care, ?Tertiary referral  Will discuss with attending  Annitta Needs, PhD, ANP-BC [facility]    LOS: 2 days    11/25/2020, 9:56 AM

## 2020-11-26 ENCOUNTER — Inpatient Hospital Stay: Payer: Medicaid Other | Admitting: Oncology

## 2020-11-26 ENCOUNTER — Ambulatory Visit (HOSPITAL_COMMUNITY): Payer: Medicaid Other | Admitting: Clinical

## 2020-11-26 ENCOUNTER — Telehealth (HOSPITAL_COMMUNITY): Payer: Self-pay | Admitting: Clinical

## 2020-11-26 DIAGNOSIS — K8020 Calculus of gallbladder without cholecystitis without obstruction: Secondary | ICD-10-CM | POA: Diagnosis not present

## 2020-11-26 DIAGNOSIS — R1010 Upper abdominal pain, unspecified: Secondary | ICD-10-CM | POA: Diagnosis not present

## 2020-11-26 DIAGNOSIS — K277 Chronic peptic ulcer, site unspecified, without hemorrhage or perforation: Secondary | ICD-10-CM | POA: Diagnosis not present

## 2020-11-26 DIAGNOSIS — I10 Essential (primary) hypertension: Secondary | ICD-10-CM | POA: Diagnosis not present

## 2020-11-26 LAB — BASIC METABOLIC PANEL
Anion gap: 7 (ref 5–15)
BUN: 5 mg/dL — ABNORMAL LOW (ref 8–23)
CO2: 24 mmol/L (ref 22–32)
Calcium: 8.3 mg/dL — ABNORMAL LOW (ref 8.9–10.3)
Chloride: 112 mmol/L — ABNORMAL HIGH (ref 98–111)
Creatinine, Ser: 0.72 mg/dL (ref 0.44–1.00)
GFR, Estimated: >60 mL/min (ref >60)
Glucose, Bld: 90 mg/dL (ref 70–99)
Potassium: 3.3 mmol/L — ABNORMAL LOW (ref 3.5–5.1)
Sodium: 143 mmol/L (ref 135–145)

## 2020-11-26 MED ORDER — SODIUM CHLORIDE 0.9 % IV SOLN
12.5000 mg | Freq: Four times a day (QID) | INTRAVENOUS | Status: DC | PRN
  Filled 2020-11-26: qty 0.5

## 2020-11-26 MED ORDER — PANTOPRAZOLE SODIUM 40 MG PO TBEC
40.0000 mg | DELAYED_RELEASE_TABLET | Freq: Two times a day (BID) | ORAL | Status: DC
  Administered 2020-11-26 – 2020-11-28 (×5): 40 mg via ORAL
  Filled 2020-11-26 (×5): qty 1

## 2020-11-26 MED ORDER — POTASSIUM CHLORIDE CRYS ER 20 MEQ PO TBCR
40.0000 meq | EXTENDED_RELEASE_TABLET | Freq: Once | ORAL | Status: AC
  Administered 2020-11-26: 40 meq via ORAL
  Filled 2020-11-26: qty 2

## 2020-11-26 NOTE — Progress Notes (Signed)
[facility]  Adv diet as tolerated. I have not heard back yet regarding possible referral. Again this can be determined as outpatient if unable to figure out plan before discharge.  Curlene Labrum, MD [facility] [address] [place], [place] [id number] [phone] (office)

## 2020-11-26 NOTE — Plan of Care (Signed)
  Problem: Acute Rehab PT Goals(only PT should resolve) Goal: Pt Will Go Supine/Side To Sit Outcome: Progressing Flowsheets (Taken 11/26/2020 0920) Pt will go Supine/Side to Sit: with modified independence Goal: Patient Will Transfer Sit To/From Stand Outcome: Progressing Flowsheets (Taken 11/26/2020 0920) Patient will transfer sit to/from stand: with modified independence Goal: Pt Will Transfer Bed To Chair/Chair To Bed Outcome: Progressing Flowsheets (Taken 11/26/2020 0920) Pt will Transfer Bed to Chair/Chair to Bed: with modified independence Goal: Pt Will Ambulate Outcome: Progressing Flowsheets (Taken 11/26/2020 0920) Pt will Ambulate:  75 feet  with supervision  with least restrictive assistive device  9:20 AM,11/26/20 Domenic Moras, PT, DPT Physical Therapist at [facility]

## 2020-11-26 NOTE — Evaluation (Signed)
Physical Therapy Evaluation Patient Details Name: Maria Murphy MRN: [id number] DOB: 26-Jun-1956 Today's Date: 11/26/2020   History of Present Illness  Maria Murphy  is a 65 y.o. female, with history of PE, PUD, HTN, GERD, and more presents to the ED with a chief complaint of abdominal pain. The pain started at 5pm 11/22/20 in her right lower quadrant. Patient reports that she drank 8oz of water at once, and that is when the pain started. At first it was 10/10. Now it is 5/10. She reports that she had 5-6 episodes of vomiting as well. Her last normal BM was the same day the pain started.She reports that the pain was constant and sharp until she received pain medications in the ED. did feel like her normal abdominal pain.  She felt associated flushing as well.  She denies any fever or chills.  She denies dysuria, hematemesis, hematochezia, and melena.  Patient has no other complaints at this time.  Clinical Impression  Patient present in room and agreeable to PT eval. States she receives OPPT at [facility] and they have been working on strength, knee ROM, and gait. Demo increased fatigue during ambulation, but good safety awareness during gait and transfers. Did not formally assess balance or bed mobility without HOB elevated, continue to assess. RN notified of mobility status and that patient left in chair. Patient will benefit from continued physical therapy in hospital and recommended venue below to increase strength, balance, endurance for safe ADLs and gait.     Follow Up Recommendations Home health PT;Supervision for mobility/OOB;Outpatient PT    Equipment Recommendations  None recommended by PT    Recommendations for Other Services       Precautions / Restrictions Precautions Precautions: Fall Restrictions Weight Bearing Restrictions: No      Mobility  Bed Mobility Overal bed mobility: Needs Assistance Bed Mobility: Supine to Sit     Supine to sit: Modified independent  (Device/Increase time)     General bed mobility comments: HOB elevated    Transfers Overall transfer level: Needs assistance Equipment used: Rolling walker (2 wheeled) Transfers: Sit to/from [facility] Sit to Stand: Min guard Stand pivot transfers: Min guard       General transfer comment: slow and labored, good safety awareness  Ambulation/Gait Ambulation/Gait assistance: Min guard Gait Distance (Feet): 45 Feet Assistive device: Rolling walker (2 wheeled) Gait Pattern/deviations: Step-through pattern;Decreased step length - right;Decreased step length - left Gait velocity: decreased   General Gait Details: decreased R knee flexion, slow and labored  Stairs            Wheelchair Mobility    Modified Rankin (Stroke Patients Only)       Balance Overall balance assessment: Mild deficits observed, not formally tested                                           Pertinent Vitals/Pain Pain Assessment: 0-10 Pain Score: 2  Pain Location: stomach Pain Intervention(s): Limited activity within patient's tolerance;Monitored during session    Home Living Family/patient expects to be discharged to:: Private residence Living Arrangements: Alone Available Help at Discharge: Available PRN/intermittently;Family Type of Home: Apartment Home Access: Level entry     Home Layout: One level Home Equipment: Cane - single point;Walker - 2 wheels Additional Comments: patient usually takes baths using sink due to unable to step over tub to get into  shower    Prior Function Level of Independence: Needs assistance   Gait / Transfers Assistance Needed: household ambulator using RW or SPC  ADL's / Homemaking Assistance Needed: assisted by family and home agencies for community ADL's  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[place], 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, newspaper        Extremity/Trunk Assessment   Upper Extremity Assessment Upper Extremity  Assessment: Overall WFL for tasks assessed    Lower Extremity Assessment Lower Extremity Assessment: Generalized weakness    Cervical / Trunk Assessment Cervical / Trunk Assessment: Normal  Communication   Communication: No difficulties  Cognition Arousal/Alertness: Awake/alert Behavior During Therapy: WFL for tasks assessed/performed                                          General Comments      Exercises     Assessment/Plan    PT Assessment Patient needs continued PT services  PT Problem List Decreased strength;Decreased range of motion;Decreased activity tolerance;Decreased safety awareness;Decreased balance;Decreased mobility;Decreased coordination       PT Treatment Interventions DME instruction;Balance training;Gait training;Neuromuscular re-education;Stair training;Functional mobility training;Patient/family education;Therapeutic activities;Therapeutic exercise;Manual techniques    PT Goals (Current goals can be found in the Care Plan section)  Acute Rehab PT Goals Patient Stated Goal: return home PT Goal Formulation: With patient Time For Goal Achievement: 12/10/20 Potential to Achieve Goals: Good    Frequency Min 3X/week   Barriers to discharge        Co-evaluation               AM-PAC PT "6 Clicks" Mobility  Outcome Measure Help needed turning from your back to your side while in a flat bed without using bedrails?: None Help needed moving from lying on your back to sitting on the side of a flat bed without using bedrails?: A Little Help needed moving to and from a bed to a chair (including a wheelchair)?: A Little Help needed standing up from a chair using your arms (e.g., wheelchair or bedside chair)?: A Little Help needed to walk in hospital room?: A Little Help needed climbing 3-5 steps with a railing? : A Lot 6 Click Score: 18    End of Session   Activity Tolerance: Patient tolerated treatment well Patient left: in  chair;with call bell/phone within reach Nurse Communication: Mobility status PT Visit Diagnosis: Unsteadiness on feet (R26.81);Other abnormalities of gait and mobility (R26.89);Muscle weakness (generalized) (M62.81)    Time: 6283-6629 PT Time Calculation (min) (ACUTE ONLY): 15 min   Charges:   PT Evaluation $PT Eval Low Complexity: 1 Low          9:19 AM,11/26/20 Domenic Moras, PT, DPT Physical Therapist at [facility]

## 2020-11-26 NOTE — Telephone Encounter (Signed)
The patient was admitted through ED to [facility] on 11/23/2020 and is currently hospitalized due to pain of upper abdomen and is not avalible for session due to hospitalization

## 2020-11-26 NOTE — Progress Notes (Signed)
PROGRESS NOTE    Patient: Maria Murphy                            PCP: Ladell Pier, MD                    DOB: 1955/12/23            DOA: 11/22/2020 DXI:[id number]             DOS: 11/26/2020, 1:49 PM   LOS: 3 days   Date of Service: The patient was seen and examined on 11/26/2020  Subjective:   The patient was seen and examined this morning, still complaining abdominal pain but  improved with pain medication... Tolerating liquid diet willing to advance her diet to  soft today.   Brief Narrative:  Tamani Durney  is a 65 y.o. female, with history of PE, PUD, HTN, GERD, and more presents to the ED with a chief complaint of abdominal pain. The pain started at 5pm 11/22/20 in her right lower quadrant. Patient reports that she drank 8oz of wate  r at once, and that is when the pain started. At first it was 10/10. Now it is 5/10. She reports that she had 5-6 episodes of vomiting as well. Her last normal BM was the same day the pain started.She reports that the pain was constant and sharp until she received pain medications in the ED. did feel like her normal abdominal pain.  She felt associated flushing as well.  She denies any fever or chills.  She denies dysuria, hematemesis, hematochezia, and melena.  Patient has no other complaints at this time.  Does not smoke, drinks wine daily with meals, and does not use illicit drugs.  Patient is full code.  Patient was last seen by GI earlier this month for hospital follow-up.  They noted that she has a history of peptic ulcer disease that was complicated by perforation requiring a Phillip Heal patch.  She is also had a chronic pancreatic lesion that they mention is concerning for malignancy.  She complained of abdominal pain at that visit as well, which they attribute to known ulcers.  They have advised her to strictly avoid NSAIDs.  She is advised to keep her appointment with Dr. Rush Landmark regarding her lesion of her pancreas, and to follow-up with GI  in 2 months.  In the ED Temp 98.7, heart rate 100, respiratory rate 22, blood pressure 164/105, satting 100% No leukocytosis with a white blood cell count of 8.9, hemoglobin 9.8 Chemistry panel reveals a hypokalemia at 3.4 CT abdomen shows negative for acute appendicitis, gallstones.  No biliary dilatation.  Stable 4.1 cm cystic lesion and calcification in the head of the pancreas.  Lastly fibroid uterus.  Patient was given a 500 bolus in the ED, Zofran, Protonix, Dilaudid    Assessment & Plan:   Principal Problem:   Abdominal pain Active Problems:   Essential hypertension   GERD (gastroesophageal reflux disease)   Gallstones   Hypokalemia   Chronic peptic ulcer   Pancreatic lesion  Abdominal pain Abdominal pain with history of nonobstructive cholelithiasis, peptic ulcer disease-chronic -Still complaining of abdominal pain, but tolerable with pain medication. Tolerated clear liquid diet till this morning, willing to advance to soft diet today.  -Surgery following closely-abdominal discomfort, due to chronic ulcer has been going on for some time is now, Dr. Eber Jones reaching out to Dr.Dr. Alonna Minium at [facility]  for an opinion... Possibly referring to [facility]--for possibility of enterectomy, vagectomy foot ulcer disease.Marland Kitchen And work-up for pancreatic lesion (Whipple procedure)   -GI consulted, seen evaluate patient: Reporting chronic issue large duodenal ulcer recommended  Meanwhile continue PPI, pain control,.. Recommending referral for possible Whipple procedure  -General surgery-following closely appreciate all help, assistance and input -Continuing IV fluids.. Be reduced subsequently discontinued -As needed analgesics, morphine -As needed antiemetics -CT abdomen pelvis reviewed: No signs of pancreatitis, gallstones with no obstruction, 4.1 cm cystic lesion at the head of pancreas, fibroid uterus,  -We will continue to monitor  Chronic cryptic ulcer  disease -Management as above, we will continue PPI, Carafate, (Protonix will be switched to p.o.) -GI and surgery following closely, planning discussion with surgery oncology, possible referral versus transfer to tertiary care [facility]  Cholelithiasis, - nonobstructive, monitoring, general surgery following, no intervention at this time Hypokalemia -monitoring and repleting, checking magnesium Hypertension-stable, will continue home medication of amlodipine History of pulmonary embolism-on Xarelto, will continue at this time GERD -currently on IV Protonix 40 daily twice daily Depression-stable continue mirtazapine, Neurontin, Celexa,    ---------------------------------------------------------------------------------------------------------------------------------------- Nutritional status:  The patient's BMI is: Body mass index is 30.04 kg/m. I agree with the assessment and plan as outlined ---------------------------------------------------------------------------------------------------------------------------------------- Cultures; none Antimicrobials: None    Consultants:   Gastroenterologist/ General surgery   ----------------------------------------------------------------------------------------------------------------------------------------  DVT prophylaxis:  SCD/Compression stockings and GY:IRSWNIO  Code Status:   Code Status: Full Code  Family Communication: Cussed with patient  No family member present at bedside- The above findings and plan of care has been discussed with patient  in detail,  they expressed understanding and agreement of above. -Advance care planning has been discussed.   Admission status:   Status is: Observation  The patient remains OBS appropriate and will d/c before 2 midnights.  Dispo: The patient is from: Home              Anticipated d/c is to: Home -if tolerating p.o., improve abdominal pain could be discharged home  in a.m.               Patient currently is not medically stable to d/c.   Difficult to place patient No      Level of care: Med-Surg   Procedures:   No admission procedures for hospital encounter.     Antimicrobials:  Anti-infectives (From admission, onward)   None       Medication:  . amLODipine  2.5 mg Oral Daily  . citalopram  20 mg Oral Daily  . donepezil  10 mg Oral QHS  . famotidine  20 mg Oral BID  . gabapentin  300 mg Oral BID  . mirtazapine  15 mg Oral QHS  . pantoprazole  40 mg Oral BID  . rivaroxaban  10 mg Oral QPM  . sucralfate  1 g Oral TID WC & HS  . traZODone  100 mg Oral QHS    acetaminophen **OR** acetaminophen, morphine injection, ondansetron **OR** ondansetron (ZOFRAN) IV   Objective:   Vitals:   11/25/20 2100 11/26/20 0039 11/26/20 0430 11/26/20 0804  BP: (!) 161/89 126/70 130/75 (!) 149/72  Pulse: (!) 57 (!) 55 (!) 54   Resp: 20  18   Temp: 97.7 F (36.5 C)  97.8 F (36.6 C)   TempSrc: Oral  Oral   SpO2: 97% 95% 96%   Weight:      Height:        Intake/Output Summary (Last 24 hours) at 11/26/2020 1349  Last data filed at 11/25/2020 1812 Gross per 24 hour  Intake 600 ml  Output --  Net 600 ml   Filed Weights   11/22/20 2204  Weight: 79.4 kg     Examination:        Physical Exam:   General:  Alert, oriented, cooperative, no distress;   HEENT:  Normocephalic, PERRL, otherwise with in Normal limits   Neuro:  CNII-XII intact. , normal motor and sensation, reflexes intact   Lungs:   Clear to auscultation BL, Respirations unlabored, no wheezes / crackles  Cardio:    S1/S2, RRR, No murmure, No Rubs or Gallops   Abdomen:   Soft, diffuse abdominal tenderness especially right upper quadrant, positive rebound tenderness, bowel sounds active all four quadrants,  no guarding or peritoneal signs.  Muscular skeletal:  Limited exam - in bed, able to move all 4 extremities, Normal strength,  2+ pulses,  symmetric, No pitting edema  Skin:  Dry, warm  to touch, negative for any Rashes,  Wounds: Please see nursing documentation           -------------------------------------------------------------------------------------------------------------------    LABs:  CBC Latest Ref Rng & Units 11/23/2020 11/22/2020 11/17/2020  WBC 4.0 - 10.5 K/uL 6.0 8.9 4.1  Hemoglobin 12.0 - 15.0 g/dL 8.6(L) 9.8(L) 9.1(L)  Hematocrit 36.0 - 46.0 % 26.8(L) 30.8(L) 29.0(L)  Platelets 150 - 400 K/uL 175 214 188   CMP Latest Ref Rng & Units 11/26/2020 11/25/2020 11/24/2020  Glucose 70 - 99 mg/dL 90 90 82  BUN 8 - 23 mg/dL 5(L) 6(L) 7(L)  Creatinine 0.44 - 1.00 mg/dL 0.72 0.65 0.65  Sodium 135 - 145 mmol/L 143 144 142  Potassium 3.5 - 5.1 mmol/L 3.3(L) 3.1(L) 3.2(L)  Chloride 98 - 111 mmol/L 112(H) 113(H) 112(H)  CO2 22 - 32 mmol/L 24 22 23   Calcium 8.9 - 10.3 mg/dL 8.3(L) 8.5(L) 8.3(L)  Total Protein 6.5 - 8.1 g/dL - - 5.8(L)  Total Bilirubin 0.3 - 1.2 mg/dL - - 0.6  Alkaline Phos 38 - 126 U/L - - 39  AST 15 - 41 U/L - - 15  ALT 0 - 44 U/L - - 7       Micro Results Recent Results (from the past 240 hour(s))  SARS CORONAVIRUS 2 (TAT 6-24 HRS) Nasopharyngeal Nasopharyngeal Swab     Status: None   Collection Time: 11/23/20 12:06 AM   Specimen: Nasopharyngeal Swab  Result Value Ref Range Status   SARS Coronavirus 2 NEGATIVE NEGATIVE Final    Comment: (NOTE) SARS-CoV-2 target nucleic acids are NOT DETECTED.  The SARS-CoV-2 RNA is generally detectable in upper and lower respiratory specimens during the acute phase of infection. Negative results do not preclude SARS-CoV-2 infection, do not rule out co-infections with other pathogens, and should not be used as the sole basis for treatment or other patient management decisions. Negative results must be combined with clinical observations, patient history, and epidemiological information. The expected result is Negative.  Fact Sheet for Patients: SugarRoll.be  Fact Sheet  for Healthcare Providers: https://www.woods-mathews.com/  This test is not yet approved or cleared by the [place] FDA and  has been authorized for detection and/or diagnosis of SARS-CoV-2 by FDA under an Emergency Use Authorization (EUA). This EUA will remain  in effect (meaning this test can be used) for the duration of the COVID-19 declaration under Se ction 564(b)(1) of the Act, 21 U.S.C. section 360bbb-3(b)(1), unless the authorization is terminated or revoked sooner.  Performed at [facility]  Lab, [address] [place], [place] [address]     Radiology Reports CT ABDOMEN PELVIS WO CONTRAST  Result Date: 11/22/2020 CLINICAL DATA:  Right lower quadrant pain EXAM: CT ABDOMEN AND PELVIS WITHOUT CONTRAST TECHNIQUE: Multidetector CT imaging of the abdomen and pelvis was performed following the standard protocol without IV contrast. COMPARISON:  Ultrasound 01/15/2021, CT 11/14/2020, 11/10/2020, 04/20/2020 FINDINGS: Lower chest: Lung bases demonstrate no acute consolidation or effusion. Linear atelectasis or scarring at the lingula. Mild cardiomegaly. Small hiatal hernia. Hepatobiliary: Stable subcentimeter hypodensity in the liver, too small to further characterize. Multiple calcified gallstones. No biliary dilatation. Pancreas: No definite acute inflammatory change. Calcifications at the head of pancreas. Cystic lesion measuring 4.1 by 2.9 cm, previously 4.2 cm. Spleen: Normal in size without focal abnormality. Adrenals/Urinary Tract: Adrenal glands within normal limits. Cysts within the kidneys. Rim calcified cyst exophytic upper pole right kidney without change. No hydronephrosis. The bladder is normal Stomach/Bowel: Stomach is within normal limits. Appendix appears normal. No evidence of bowel wall thickening, distention, or inflammatory changes. Vascular/Lymphatic: Nonaneurysmal aorta. Mild aortic atherosclerosis. No suspicious nodes Reproductive: Multiple uterine masses  consistent with fibroids. Other: Negative for free air or free fluid Musculoskeletal: Grade 1 anterolisthesis L4 on L5. Degenerative changes of the lumbar spine. No acute osseous abnormality IMPRESSION: 1. Negative for acute appendicitis. 2. Gallstones. No biliary dilatation. 3. Stable 4.1 cm cystic lesion and calcification within the head of the pancreas. 4. Fibroid uterus. Aortic Atherosclerosis (ICD10-I70.0). Electronically Signed   By: Donavan Foil M.D.   On: 11/22/2020 23:17   CT Angio Chest PE W and/or Wo Contrast  Result Date: 11/10/2020 CLINICAL DATA:  65 year old female with right rib and flank pain. History of PE in September 2021. Abdominal pain with black tarry stools. History of cholelithiasis. History of exploratory laparotomy, perforated gastric ulcer in August 2021. EXAM: CT ANGIOGRAPHY CHEST CT ABDOMEN AND PELVIS WITH CONTRAST TECHNIQUE: Multidetector CT imaging of the chest was performed using the standard protocol during bolus administration of intravenous contrast. Multiplanar CT image reconstructions and MIPs were obtained to evaluate the vascular anatomy. Multidetector CT imaging of the abdomen and pelvis was performed using the standard protocol during bolus administration of intravenous contrast. CONTRAST:  138mL OMNIPAQUE IOHEXOL 350 MG/ML SOLN COMPARISON:  CT Abdomen and Pelvis 10/15/2020 and earlier. CTA chest 05/01/2020. FINDINGS: CTA CHEST FINDINGS Cardiovascular: Good contrast bolus timing in the pulmonary arterial tree. No focal filling defect identified in the pulmonary arteries today to suggest acute or residual pulmonary embolism. Minimal calcified coronary artery atherosclerosis is evident. Mild cardiomegaly. No pericardial effusion. Minimal plaque of the thoracic aorta. Mediastinum/Nodes: Negative.  No lymphadenopathy. Lungs/Pleura: Somewhat lower lung volumes compared to last year. Resolved pleural effusions and lower lobe collapse or consolidation. Mild mosaic attenuation  and mild peribronchial streaky opacity in the lingula, favor a combination of atelectasis and mild gas trapping. Major airways are patent. No areas suspicious for acute respiratory infection. Musculoskeletal: Chronic severe T10-T11 disc and endplate degeneration. Chronic severe degeneration at the visible shoulders. No acute osseous abnormality identified. Review of the MIP images confirms the above findings. CT ABDOMEN and PELVIS FINDINGS Hepatobiliary: Chronic gallstones individually up to 16 mm. Continued indistinct appearance of the porta hepatis which appears mildly inflamed. But at the gallbladder fundus no wall thickening or pericholecystic inflammation is evident. No hyperenhancement at the gallbladder fossa. Stable liver parenchyma including tiny benign appearing low-density area at the dome. No bile duct enlargement. Pancreas: Abnormal partially calcified multi cystic appearing pancreatic head mass  as described on the CT last month appears stable measuring 4.3 cm on series 4, image 37. No main pancreatic ductal dilatation. No superimposed acute pancreatitis suspected. However, along with the indistinct porta hepatis the gastric antrum and proximal duodenum continue to appear indistinct and inflamed. See coronal image 35 today. Compared to last month, the distal stomach and proximal duodenum appear mildly improved. No extraluminal gas or fluid identified. Proximal stomach and distal duodenum appear within normal limits. Spleen: Negative. Adrenals/Urinary Tract: Adrenal glands remain normal. Stable kidneys with bilateral parapelvic and exophytic renal cystic lesions which appeared benign by MRI in August of last year. Symmetric renal contrast excretion with no hydronephrosis or hydroureter. Diminutive and unremarkable urinary bladder. Stable pelvic phleboliths. Stomach/Bowel: Sigmoid diverticulosis again noted. No active inflammation identified. Otherwise redundant large bowel. Retained stool in the right  colon and at the hepatic flexure. Normal appendix visible on series 4, image 60. Decompressed and negative terminal ileum. No dilated small bowel. Stomach and duodenum described above. No free air. No free fluid. Vascular/Lymphatic: Major arterial structures remain patent with tortuosity but minimal atherosclerosis. Portal venous system is patent. No lymphadenopathy. Reproductive: Stable lobulated fibroid uterus. Other: No pelvic free fluid. Musculoskeletal: Stable advanced lumbar disc and facet degeneration. No acute osseous abnormality identified. Review of the MIP images confirms the above findings. IMPRESSION: CHEST: 1. Negative for acute or residual pulmonary embolus. 2. Mild scattered pulmonary atelectasis and gas trapping. No other acute process in the chest. ABDOMEN AND PELVIS: 1. Largely stable CT appearance of the abdomen and pelvis since 10/15/2020 (please see also that report): 2. Stable cystic and partially calcified pancreatic head mass, 4.3 cm. 3. Slightly improved appearance of the gastric antrum, duodenal bulb, and porta hepatis from last month. As before this may reflect improving peptic ulcer disease. Underlying chronic cholelithiasis, without strong evidence of acute cholecystitis today. 4. Fibroid uterus. Multiple renal cysts which had a benign MRI appearance last yea. Sigmoid diverticulosis. Electronically Signed   By: Genevie Ann M.D.   On: 11/10/2020 06:10   CT ABDOMEN PELVIS W CONTRAST  Result Date: 11/10/2020 CLINICAL DATA:  65 year old female with right rib and flank pain. History of PE in September 2021. Abdominal pain with black tarry stools. History of cholelithiasis. History of exploratory laparotomy, perforated gastric ulcer in August 2021. EXAM: CT ANGIOGRAPHY CHEST CT ABDOMEN AND PELVIS WITH CONTRAST TECHNIQUE: Multidetector CT imaging of the chest was performed using the standard protocol during bolus administration of intravenous contrast. Multiplanar CT image reconstructions and  MIPs were obtained to evaluate the vascular anatomy. Multidetector CT imaging of the abdomen and pelvis was performed using the standard protocol during bolus administration of intravenous contrast. CONTRAST:  127mL OMNIPAQUE IOHEXOL 350 MG/ML SOLN COMPARISON:  CT Abdomen and Pelvis 10/15/2020 and earlier. CTA chest 05/01/2020. FINDINGS: CTA CHEST FINDINGS Cardiovascular: Good contrast bolus timing in the pulmonary arterial tree. No focal filling defect identified in the pulmonary arteries today to suggest acute or residual pulmonary embolism. Minimal calcified coronary artery atherosclerosis is evident. Mild cardiomegaly. No pericardial effusion. Minimal plaque of the thoracic aorta. Mediastinum/Nodes: Negative.  No lymphadenopathy. Lungs/Pleura: Somewhat lower lung volumes compared to last year. Resolved pleural effusions and lower lobe collapse or consolidation. Mild mosaic attenuation and mild peribronchial streaky opacity in the lingula, favor a combination of atelectasis and mild gas trapping. Major airways are patent. No areas suspicious for acute respiratory infection. Musculoskeletal: Chronic severe T10-T11 disc and endplate degeneration. Chronic severe degeneration at the visible shoulders. No acute osseous abnormality  identified. Review of the MIP images confirms the above findings. CT ABDOMEN and PELVIS FINDINGS Hepatobiliary: Chronic gallstones individually up to 16 mm. Continued indistinct appearance of the porta hepatis which appears mildly inflamed. But at the gallbladder fundus no wall thickening or pericholecystic inflammation is evident. No hyperenhancement at the gallbladder fossa. Stable liver parenchyma including tiny benign appearing low-density area at the dome. No bile duct enlargement. Pancreas: Abnormal partially calcified multi cystic appearing pancreatic head mass as described on the CT last month appears stable measuring 4.3 cm on series 4, image 37. No main pancreatic ductal  dilatation. No superimposed acute pancreatitis suspected. However, along with the indistinct porta hepatis the gastric antrum and proximal duodenum continue to appear indistinct and inflamed. See coronal image 35 today. Compared to last month, the distal stomach and proximal duodenum appear mildly improved. No extraluminal gas or fluid identified. Proximal stomach and distal duodenum appear within normal limits. Spleen: Negative. Adrenals/Urinary Tract: Adrenal glands remain normal. Stable kidneys with bilateral parapelvic and exophytic renal cystic lesions which appeared benign by MRI in August of last year. Symmetric renal contrast excretion with no hydronephrosis or hydroureter. Diminutive and unremarkable urinary bladder. Stable pelvic phleboliths. Stomach/Bowel: Sigmoid diverticulosis again noted. No active inflammation identified. Otherwise redundant large bowel. Retained stool in the right colon and at the hepatic flexure. Normal appendix visible on series 4, image 60. Decompressed and negative terminal ileum. No dilated small bowel. Stomach and duodenum described above. No free air. No free fluid. Vascular/Lymphatic: Major arterial structures remain patent with tortuosity but minimal atherosclerosis. Portal venous system is patent. No lymphadenopathy. Reproductive: Stable lobulated fibroid uterus. Other: No pelvic free fluid. Musculoskeletal: Stable advanced lumbar disc and facet degeneration. No acute osseous abnormality identified. Review of the MIP images confirms the above findings. IMPRESSION: CHEST: 1. Negative for acute or residual pulmonary embolus. 2. Mild scattered pulmonary atelectasis and gas trapping. No other acute process in the chest. ABDOMEN AND PELVIS: 1. Largely stable CT appearance of the abdomen and pelvis since 10/15/2020 (please see also that report): 2. Stable cystic and partially calcified pancreatic head mass, 4.3 cm. 3. Slightly improved appearance of the gastric antrum, duodenal  bulb, and porta hepatis from last month. As before this may reflect improving peptic ulcer disease. Underlying chronic cholelithiasis, without strong evidence of acute cholecystitis today. 4. Fibroid uterus. Multiple renal cysts which had a benign MRI appearance last yea. Sigmoid diverticulosis. Electronically Signed   By: Genevie Ann M.D.   On: 11/10/2020 06:10   US Abdomen Limited  Result Date: 11/15/2020 CLINICAL DATA:  Right upper quadrant abdominal pain. EXAM: ULTRASOUND ABDOMEN LIMITED RIGHT UPPER QUADRANT COMPARISON:  November 14, 2020.  October 18, 2020. FINDINGS: Gallbladder: Cholelithiasis is noted as well as some degree of sludge within the gallbladder lumen. No gallbladder wall thickening or pericholecystic fluid is noted. The absence or presence of sonographic Murphy's sign was not reported by the technologist. Common bile duct: Diameter: 2 mm which is within normal limits. Liver: No focal lesion identified. Within normal limits in parenchymal echogenicity. Portal vein is patent on color Doppler imaging with normal direction of blood flow towards the liver. Other: None. IMPRESSION: Cholelithiasis and gallbladder sludge is noted without gallbladder wall thickening or pericholecystic fluid. Electronically Signed   By: Marijo Conception M.D.   On: 11/15/2020 09:00   DG ABD ACUTE 2+V W 1V CHEST  Result Date: 11/11/2020 CLINICAL DATA:  65 year old female with persistent abdominal pain and vomiting. EXAM: DG ABDOMEN ACUTE WITH 1  VIEW CHEST COMPARISON:  CTA chest and CT Abdomen and Pelvis 11/10/2020, and earlier. FINDINGS: Portable AP upright view of the chest at 0724 hours. Lower lung volumes. No acute pulmonary opacity. Stable cardiac size and mediastinal contours. No pneumoperitoneum. Portable upright and supine views of the abdomen 0726 hours. Non obstructed bowel gas pattern. Cholelithiasis again noted. Pelvic vascular calcifications. No acute osseous abnormality identified. IMPRESSION: 1. Normal bowel  gas pattern, no free air. Cholelithiasis again noted. 2. Lower lung volumes with no acute cardiopulmonary abnormality. Electronically Signed   By: Genevie Ann M.D.   On: 11/11/2020 07:54   DG Abdomen Acute W/Chest  Result Date: 11/01/2020 CLINICAL DATA:  Abdominal pain with black, tarry stools. EXAM: DG ABDOMEN ACUTE WITH 1 VIEW CHEST COMPARISON:  October 18, 2020 FINDINGS: There is no evidence of dilated bowel loops or free intraperitoneal air. No radiopaque calculi are seen. A stable 1.1 cm soft tissue calcification is seen overlying the medial aspect of the mid to upper right abdomen. Heart size and mediastinal contours are within normal limits. Chronic appearing increased lung markings are seen without evidence of acute infiltrate. IMPRESSION: 1. No evidence of bowel obstruction or acute cardiopulmonary disease. 2. Stable soft tissue calcification within the abdomen which corresponds to the partially calcified pancreatic mass seen on prior studies. Electronically Signed   By: Virgina Norfolk M.D.   On: 11/01/2020 23:55   MM DIAG BREAST TOMO BILATERAL  Result Date: 11/12/2020 CLINICAL DATA:  65 year old female presenting for annual bilateral mammogram and 1 year follow-up of probably benign right breast calcifications. EXAM: DIGITAL DIAGNOSTIC BILATERAL MAMMOGRAM WITH TOMOSYNTHESIS AND CAD TECHNIQUE: Bilateral digital diagnostic mammography and breast tomosynthesis was performed. The images were evaluated with computer-aided detection. COMPARISON:  Previous exam(s). ACR Breast Density Category b: There are scattered areas of fibroglandular density. FINDINGS: A 9 mm group of coarse calcifications in the upper-outer right breast are mammographically stable. No new or suspicious mammographic findings are identified in the remainder of either breast. IMPRESSION: 1. Stable, probably benign right breast calcifications. Recommendation is for a final mammographic follow-up in 1 year. 2. No mammographic evidence of  malignancy on the left. RECOMMENDATION: Bilateral diagnostic mammogram in 1 year. I have discussed the findings and recommendations with the patient. If applicable, a reminder letter will be sent to the patient regarding the next appointment. BI-RADS CATEGORY  3: Probably benign. Electronically Signed   By: Kristopher Oppenheim M.D.   On: 11/12/2020 14:23   CT Renal Stone Study  Result Date: 11/14/2020 CLINICAL DATA:  Right flank pain EXAM: CT ABDOMEN AND PELVIS WITHOUT CONTRAST TECHNIQUE: Multidetector CT imaging of the abdomen and pelvis was performed following the standard protocol without IV contrast. COMPARISON:  11/10/2020 FINDINGS: Lower chest: Scarring in the lingula. Small hiatal hernia. No effusions. Hepatobiliary: Multiple gallstones within the gallbladder measuring up 2 17 mm. Small hypodensity in the right hepatic dome compatible with cyst. Pancreas: Partially calcified cystic mass again seen within the pancreatic head measures 4.2 cm, stable. No pancreatic ductal dilatation. Spleen: No focal abnormality.  Normal size. Adrenals/Urinary Tract: Multiple cystic lesions within the kidneys bilaterally. No hydronephrosis. Adrenal glands and urinary bladder unremarkable. Stomach/Bowel: Large stool burden throughout the colon. Normal appendix. Stomach is decompressed. Mildly prominent fluid-filled right abdominal small bowel loops. Terminal ileum is decompressed. Vascular/Lymphatic: No evidence of aneurysm or adenopathy. Reproductive: Fibroid uterus.  No adnexal mass. Other: No free fluid or free air. Musculoskeletal: No acute bony abnormality. IMPRESSION: Cholelithiasis. Stable partially calcified cystic mass within the  pancreatic head. Stable bilateral renal cystic lesions. No renal or ureteral stones. No hydronephrosis. Large stool burden throughout the colon. Fibroid uterus. Electronically Signed   By: Rolm Baptise M.D.   On: 11/14/2020 23:55    SIGNED: Deatra James, MD, FHM. [facility],   Pager (please use [email or web address] to page/text) Please use Epic Secure Chat for non-urgent communication (7AM-7PM)  If 7PM-7AM, please contact night-coverage [email or web address], 11/26/2020, 1:49 PM

## 2020-11-26 NOTE — Progress Notes (Signed)
Subjective: Abdominal pain improving. About 5/10 in severity. No nausea or vomiting today. Tolerate grits very well this morning. BM today. No brbpr or melena. No hematemesis or coffee ground emesis.   Prior to admission, she had been doing fairly well with mild upper abdominal pain after eating, but nothing significant. Had not been experiencing nausea or vomiting. Denies NSAIDs. Getting back to her baseline prior to admission.   Last received morphine at 12:39 am.   Objective: Vital signs in last 24 hours: Temp:  [97.7 F (36.5 C)-98.6 F (37 C)] 97.8 F (36.6 C) (04/05 0430) Pulse Rate:  [54-57] 54 (04/05 0430) Resp:  [18-20] 18 (04/05 0430) BP: (126-161)/(67-89) 149/72 (04/05 0804) SpO2:  [95 %-97 %] 96 % (04/05 0430) Last BM Date: 11/22/20 General:   Alert and oriented, pleasant, no acute distress. Head:  Normocephalic and atraumatic. Eyes:  No icterus, sclera clear. Conjuctiva pink.  Abdomen:  Bowel sounds present, soft, non-distended. Minimal TTP in epigastric and RUQ. No HSM or hernias noted. No rebound or guarding. No masses appreciated  Extremities:  Without edema. Neurologic:  Alert and  oriented x4;  grossly normal neurologically. Psych:  Normal mood and affect.  Intake/Output from previous day: 04/04 0701 - 04/05 0700 In: 1440 [P.O.:1440] Out: -  Intake/Output this shift: No intake/output data recorded.  Lab Results: No results for input(s): WBC, HGB, HCT, PLT in the last 72 hours. BMET Recent Labs    11/24/20 0837 11/25/20 0508 11/26/20 0432  NA 142 144 143  K 3.2* 3.1* 3.3*  CL 112* 113* 112*  CO2 23 22 24   GLUCOSE 82 90 90  BUN 7* 6* 5*  CREATININE 0.65 0.65 0.72  CALCIUM 8.3* 8.5* 8.3*   LFT Recent Labs    11/24/20 0837  PROT 5.8*  ALBUMIN 3.1*  AST 15  ALT 7  ALKPHOS 31  BILITOT 0.6    Assessment: 65 year old female with history of perforated peptic ulcer in August 2021s/p Phillip Heal patch,known chronic cholelithiasis, pancreatic  mass, history of PE in Sept 2021, andmultiple admissions with persistent abdominal pain, N/V, admitted again with acute on chronic abdominal pain, N/V.  CT this admission with no acute findings.  In interim from last admission in March 2022, she underwent the EGD/EUS with Dr. 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 Cystic lesion in pancreatic head but entire head of pancreas or cystic region couldn't be evaluated due to large, still healing duodenal ulcer as scope could only partially traverse into the duodenal bulb.  Recommended repeat EUS in 2 to 3 months.  Notably, Dr. Rush Landmark did state if previous sizing was correct, appears ulcer is healing based on the size of the ulcer at that time.  Clinically, patient has improved since admission. Only with mild abdominal pain at this time without nausea or vomiting.  Last received morphine at 12:39 AM.  Seems she is back to her baseline prior to admission.  Suspect she will continue with some abdominal pain in the setting of PUD. Can't rule out influence from cholelithiasis, but she had a normal HIDA in January 2022.  Surgery following again this admission but no plans for intervention.  There is suspicion that surgery would not be limited to cholecystectomy, and patient may end  up needing antrectomy/vagotomy for ulcer disease and further work-up of pancreatic lesion. Dr. Constance Haw is reaching out to Dr. Alonna Minium at [facility] for Surg Oncology expertise and help. Stated if Dr. Alonna Minium did not think referral was appropriate, would refer to [facility].  This is a challenging scenario.  It is encouraging that her ulcer appeared to be healing on recent EGD;  however, ultimately, she may end up needing referral to tertiary care. I did reach out to Dr. Constance Haw today, and she has not heard anything back from Dr. Alonna Minium.    Plan: 1.  Continue soft diet. 2.  Continue PPI twice daily. 3.  Continue Carafate 3 times daily before meals and at bedtime. 4.  EUS in 3 months with Dr. Rush Landmark.  5.  Unclear if MRI/MRCP would be helpful in further characterizing pancreatic lesion to help guide plan of care, 6.  May need tertiary care referral. Dr. Constance Haw is waiting to hear back from Dr. Alonna Minium.    LOS: 3 days    11/26/2020, 11:50 AM   Aliene Altes, PA-C [facility]

## 2020-11-27 ENCOUNTER — Ambulatory Visit (HOSPITAL_COMMUNITY): Payer: Medicaid Other | Admitting: Physical Therapy

## 2020-11-27 ENCOUNTER — Telehealth: Payer: Self-pay | Admitting: Gastroenterology

## 2020-11-27 ENCOUNTER — Encounter (HOSPITAL_COMMUNITY): Payer: Self-pay

## 2020-11-27 DIAGNOSIS — E876 Hypokalemia: Secondary | ICD-10-CM | POA: Diagnosis not present

## 2020-11-27 DIAGNOSIS — R1010 Upper abdominal pain, unspecified: Secondary | ICD-10-CM | POA: Diagnosis not present

## 2020-11-27 DIAGNOSIS — I10 Essential (primary) hypertension: Secondary | ICD-10-CM

## 2020-11-27 DIAGNOSIS — K869 Disease of pancreas, unspecified: Secondary | ICD-10-CM

## 2020-11-27 LAB — BASIC METABOLIC PANEL
Anion gap: 9 (ref 5–15)
BUN: 9 mg/dL (ref 8–23)
CO2: 23 mmol/L (ref 22–32)
Calcium: 8.5 mg/dL — ABNORMAL LOW (ref 8.9–10.3)
Chloride: 110 mmol/L (ref 98–111)
Creatinine, Ser: 0.83 mg/dL (ref 0.44–1.00)
GFR, Estimated: >60 mL/min (ref >60)
Glucose, Bld: 103 mg/dL — ABNORMAL HIGH (ref 70–99)
Potassium: 3.4 mmol/L — ABNORMAL LOW (ref 3.5–5.1)
Sodium: 142 mmol/L (ref 135–145)

## 2020-11-27 NOTE — Discharge Summary (Signed)
Physician Discharge Summary  Maria Murphy ION:[id number] DOB: 1956/02/03 DOA: 11/22/2020  PCP: Maria Matar, MD  Admit date: 11/22/2020 Discharge date: 11/27/2020  Admitted From: Home Disposition:  Home   Recommendations for Outpatient Follow-up:  1. Follow up with PCP in 1-2 weeks 2. Please obtain BMP/CBC in one week   Discharge Condition: Stable CODE STATUS: FULL Diet recommendation: soft   Brief/Interim Summary: 65 year old female with history of hypertension, peptic ulcer disease with perforation, saddle pulmonary embolus on rivaroxabanuntil recently on 11/03/20, depression presenting with 1-day history of intermittent abdominal pain and nausea and vomiting. She describes her abd pain as severe and sharp, primarily in RUQ and R-flank.She denies any NSAID use. She has not had any fevers, chills, dysuria, hematuria, hematochezia, melena, diarrhea. She denies any frank chest pain, shortness breath, hemoptysis, coughing, headache, neck pain,rashes.She denies any recent injury or trauma.  Patient has had numerous emergency department visits and hospital admissions. Most recently she was admitted from 11/14/20 to 11/18/20 with abdominal pain and n/v again.  This was felt to bemultifactorialsecondary to her peptic ulcer disease, constipation and possible symptomatic cholelithiasis.   Since d/c, she underwent EGD and EUS by Dr. Meridee Score on 11/20/20.  There waspresence of grade C esophagitis in the esophagus in the distal area, at 3 continue to high hernia was present, few small (largest 8 mm) cratered ulcers were found in the gastric body and antrum with a clean ulcer base.  There was deformity of the stomach and the pylorus and the proximal duodenal bulb.  Biopsies were taken from the stomach to rule out H. pylori which came back neg for malinancy.  There was presence of nonbleeding cratered duodenal ulcer with a clean base in the duodenal bulb extending up to 22 mm, there was  presence of suture material.  There was presence of acquired stenosis due to the previous surgical intervention but the scope could be passed down.  Biopsies from the duodenal ulcer edges were obtained.  Endoscopic ultrasound no show any abnormality of the genu of the pancreas, pancreatic body and pancreatic tail however the evaluation of the cystic lesion located in the head of the pancreas could not be assessed thoroughly as a result of the large duodenal ulcer.  There were no malignant appearing lymph nodes in the celiac lesion.  Again, patient was admitted to the hospital from 11/01/2020 to 11/03/2020 for another GI bleed. The patient was transfused 1 unit PRBC. She was discharged with a hemoglobin 9.3. During that hospital admission, the decision was made to discontinue her therapeutic dosing of rivaroxaban. Although the patient was initially diagnosed with her PE on 05/01/2020, the patient had endorsed poor compliance with her rivaroxaban until the beginning of November 2021 after which she had been taking it faithfully. Therefore, the target date for stopping her rivaroxaban was initially targeted around the end of April 2022. However given the patient's recurrent GI bleeds and the fact that she had finished essentially 5 continuous months of rivaroxaban, the decision was made to discontinue therapeutic dosing and start the patient on rivaroxaban 10 mg daily at the time of her discharge on 11/03/2020. Unfortunately, as consistent with her previous history the patient had some type of misunderstanding and has not restarted her rivaroxaban in the interim since her discharge on 11/03/2020.  The patient has had multiple emergency department visits. Once again, the patient had a recent ED visit on 11/10/2020 with similar right flank pain. CT of the abdomen and pelvis at that time was unremarkable,  and the patient was discharged home in stable condition. She had a CT of the abdomen and pelvis on 10/15/2020  which showed a multicystic partially calcified mass in the pancreatic head measuring up to 43 mm. There was also more prominent main pancreatic duct.There is also an inflamed appearance of the distal stomach and proximal duodenum with adjacent fat stranding that was increased since 09/24/2020. There is mild inflamed appearance of the gallbladder and porta hepatis. The patient was discharged home in stable condition after fluids and conservative care in the emergency department with a prescription for oxycodone.   Notably, the patient has had a complicated history of peptic ulcer disease secondary to NSAID use. She had a prolonged hospitalization from 04/21/2020 to 05/17/2020 secondary to a perforated gastric ulcer. She underwent exploratory laparotomy with Cheree Ditto patch placed on 04/22/2021. This was complicated by intra-abdominal abscess for which required IR aspiration. That hospitalization was also complicated by a pulmonary embolus for which the patient was started on rivaroxaban. She was subsequently remitted to the hospital on 07/05/2020 to 07/12/2020 for intractable nausea and vomiting. Fortunately, the patient improved with conservative/nonoperative management. She underwent EGD on 07/10/2020 which showed a scar in the gastric antrum and prepyloric region. There was nonbleeding duodenal ulcers. Once again, the patient was readmitted from 09/23/2020 to2/4/22 for abdominal pain and intractable nausea and vomiting. Again, the patient improved with conservative care.  The patient was placed on bowel rest and started on cathartics with some improvement in her abd pain.  Her diet was advanced, but she had some worsen abd pain.  She was downgraded to full liquids with she tolerated.    Discharge Diagnoses:  IntractableRightabdominal pain/vomiting -This appears to be an exacerbation of her chronic upper and R-sided abd pain -Although she does not specifically endorse "chronic" Right side abd  pain, review of her extensive medical records shows she has had this type and location of pain on several prior occasions -Suspect this ismultifactorialsecondary to her peptic ulcer disease, constipation and possible symptomatic cholelithiasis -Continue Protonixand carafate -11/20/20 EGD and EUS as discussed above -10/18/20 EGD--nonbleeding gastric ulcers;large duodenal ulcer in the duodenal bulb, close to 35 mm in diameter;no presence of stigmata of recent bleeding but there was presence of a suture at the bed of the ulcer which was semicircumferential -Avoid NSAIDs -11/22/20 CT abd--no acute findings.  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 Needs referral to Dr. Donell Beers vs tertiary care center  Inflamed gallbladder appearance--noted on prior imaging -09/18/2020 HIDA scan normal -10/18/20 RUQ US--Cholelithiasis and mild gallbladder wall thickening.+murphy's -Previously seen by surgery who felt she would need cholecystectomy at some point -RUQ [place] 11/15/20--Cholelithiasis and gallbladder sludge is noted without gallbladder wall thickening or pericholecystic fluid.  Pancreatic head mass -This has been followed by surveillance by GI -EUS 11/20/20 by Dr. Reina Fuse not show any abnormality of the genu of the pancreas, pancreatic body and pancreatic tail however the evaluation of the cystic lesion located in the head of the pancreas could not be assessed thoroughly as a result of the large duodenal ulcer  ABLA -baseline Hgb 10-11 -Hgb now  stable--9.8  History of pulmonary  embolus -Patient had submassive/saddle embolus on CTA on 05/01/2020 -Temporarily holding rivaroxaban--restart at 10 mg daily  -patient has been taking faithfully since beginning of Nov 2021 -plan 6 full months of tx-->prior targetend of Aprilbut stopped on 11/03/20 as discussed above  Essential hypertension -continue amlodipine -Monitor BP -IV labetalol as neededSBP >180  Depression -Restarted Celexa and trazodone once able to tolerate p.o. -Restarted Remeron once able to tolerate p.o.    Discharge Instructions  Discharge Instructions    Ambulatory referral to Physical Therapy   Complete by: As directed      Allergies as of 11/27/2020      Reactions   Wheat Extract Swelling      Medication List    TAKE these medications   acetaminophen 500 MG tablet Commonly known as: TYLENOL Take 1 tablet (500 mg total) by mouth every 6 (six) hours as needed for mild pain or fever. What changed:   how much to take  when to take this   amLODipine 2.5 MG tablet Commonly known as: NORVASC Take 1 tablet (2.5 mg total) by mouth daily. For BP   citalopram 20 MG tablet Commonly known as: CELEXA Take 20 mg by mouth daily.   dicyclomine 10 MG capsule Commonly known as: BENTYL Take 10 mg by mouth 2 (two) times daily.   donepezil 10 MG tablet Commonly known as: ARICEPT Take 0.5-1 tablets (5-10 mg total) by mouth in the morning and at bedtime. What changed: how much to take   famotidine 20 MG tablet Commonly known as: Pepcid Take 1 tablet (20 mg total) by mouth 2 (two) times daily.   ferrous sulfate 325 (65 FE) MG tablet Take 1 tablet (325 mg total) by mouth daily with breakfast.   gabapentin 300 MG capsule Commonly known as: NEURONTIN Take 1 capsule (300 mg total) by mouth 2 (two) times daily.   lactose free nutrition Liqd Take 237 mLs by mouth 2 (two) times daily.   lipase/protease/amylase 16109 UNITS Cpep capsule Commonly known as:  Creon Take 2 capsules (72,000 Units total) by mouth 3 (three) times daily with meals. May also take 1 capsule (36,000 Units total) as needed (with snacks).   mirtazapine 15 MG tablet Commonly known as: Remeron Take 1 tablet (15 mg total) by mouth at bedtime.   ondansetron 8 MG disintegrating tablet Commonly known as: Zofran ODT Take 1 tablet (8 mg total) by mouth every 8 (eight) hours as needed for nausea or vomiting.   pantoprazole 40 MG tablet Commonly known as: PROTONIX Take 1 tablet (40 mg total) by mouth 2 (two) times daily.   promethazine 25 MG suppository Commonly known as: PHENERGAN Place 1 suppository (25 mg total) rectally every 6 (six) hours as needed for nausea or vomiting.   promethazine 25 MG tablet Commonly known as: PHENERGAN Take 1 tablet (25 mg total) by mouth every 6 (six) hours as needed for nausea or vomiting.   rivaroxaban 10 MG Tabs tablet Commonly known as: XARELTO Take 1 tablet (10 mg total) by mouth every evening.   sucralfate 1 GM/10ML suspension Commonly known as: CARAFATE Take 10 mLs (1 g total) by mouth 4 (four) times daily -  with meals and at bedtime.   traZODone 100 MG tablet Commonly known as: DESYREL Take 100 mg by mouth at bedtime.       Follow-up Information    Maria Matar, MD Follow up on 01/16/2021.   Specialty: Internal Medicine Why: You are scheduled to see Dr. Laural Benes on Thursday,  May 26th at 930am. Contact information: [address] [place] [address] [phone]              Allergies  Allergen Reactions  . Wheat Extract Swelling    Consultations:  General surgery  GI   Procedures/Studies: CT ABDOMEN PELVIS WO CONTRAST  Result Date: 11/22/2020 CLINICAL DATA:  Right lower quadrant pain EXAM: CT ABDOMEN AND PELVIS WITHOUT CONTRAST TECHNIQUE: Multidetector CT imaging of the abdomen and pelvis was performed following the standard protocol without IV contrast. COMPARISON:  Ultrasound 01/15/2021, CT  11/14/2020, 11/10/2020, 04/20/2020 FINDINGS: Lower chest: Lung bases demonstrate no acute consolidation or effusion. Linear atelectasis or scarring at the lingula. Mild cardiomegaly. Small hiatal hernia. Hepatobiliary: Stable subcentimeter hypodensity in the liver, too small to further characterize. Multiple calcified gallstones. No biliary dilatation. Pancreas: No definite acute inflammatory change. Calcifications at the head of pancreas. Cystic lesion measuring 4.1 by 2.9 cm, previously 4.2 cm. Spleen: Normal in size without focal abnormality. Adrenals/Urinary Tract: Adrenal glands within normal limits. Cysts within the kidneys. Rim calcified cyst exophytic upper pole right kidney without change. No hydronephrosis. The bladder is normal Stomach/Bowel: Stomach is within normal limits. Appendix appears normal. No evidence of bowel wall thickening, distention, or inflammatory changes. Vascular/Lymphatic: Nonaneurysmal aorta. Mild aortic atherosclerosis. No suspicious nodes Reproductive: Multiple uterine masses consistent with fibroids. Other: Negative for free air or free fluid Musculoskeletal: Grade 1 anterolisthesis L4 on L5. Degenerative changes of the lumbar spine. No acute osseous abnormality IMPRESSION: 1. Negative for acute appendicitis. 2. Gallstones. No biliary dilatation. 3. Stable 4.1 cm cystic lesion and calcification within the head of the pancreas. 4. Fibroid uterus. Aortic Atherosclerosis (ICD10-I70.0). Electronically Signed   By: Jasmine Pang M.D.   On: 11/22/2020 23:17   CT Angio Chest PE W and/or Wo Contrast  Result Date: 11/10/2020 CLINICAL DATA:  65 year old female with right rib and flank pain. History of PE in September 2021. Abdominal pain with black tarry stools. History of cholelithiasis. History of exploratory laparotomy, perforated gastric ulcer in August 2021. EXAM: CT ANGIOGRAPHY CHEST CT ABDOMEN AND PELVIS WITH CONTRAST TECHNIQUE: Multidetector CT imaging of the chest was performed  using the standard protocol during bolus administration of intravenous contrast. Multiplanar CT image reconstructions and MIPs were obtained to evaluate the vascular anatomy. Multidetector CT imaging of the abdomen and pelvis was performed using the standard protocol during bolus administration of intravenous contrast. CONTRAST:  OMNIPAQUE IOHEXOL 350 MG/ML SOLN COMPARISON:  CT Abdomen and Pelvis 10/15/2020 and earlier. CTA chest 05/01/2020. FINDINGS: CTA CHEST FINDINGS Cardiovascular: Good contrast bolus timing in the pulmonary arterial tree. No focal filling defect identified in the pulmonary arteries today to suggest acute or residual pulmonary embolism. Minimal calcified coronary artery atherosclerosis is evident. Mild cardiomegaly. No pericardial effusion. Minimal plaque of the thoracic aorta. Mediastinum/Nodes: Negative.  No lymphadenopathy. Lungs/Pleura: Somewhat lower lung volumes compared to last year. Resolved pleural effusions and lower lobe collapse or consolidation. Mild mosaic attenuation and mild peribronchial streaky opacity in the lingula, favor a combination of atelectasis and mild gas trapping. Major airways are patent. No areas suspicious for acute respiratory infection. Musculoskeletal: Chronic severe T10-T11 disc and endplate degeneration. Chronic severe degeneration at the visible shoulders. No acute osseous abnormality identified. Review of the MIP images confirms the above findings. CT ABDOMEN and PELVIS FINDINGS Hepatobiliary: Chronic gallstones individually up to 16 mm. Continued indistinct appearance of the porta hepatis which appears mildly inflamed. But at the gallbladder fundus no wall thickening or pericholecystic inflammation  is evident. No hyperenhancement at the gallbladder fossa. Stable liver parenchyma including tiny benign appearing low-density area at the dome. No bile duct enlargement. Pancreas: Abnormal partially calcified multi cystic appearing pancreatic head mass as  described on the CT last month appears stable measuring 4.3 cm on series 4, image 37. No main pancreatic ductal dilatation. No superimposed acute pancreatitis suspected. However, along with the indistinct porta hepatis the gastric antrum and proximal duodenum continue to appear indistinct and inflamed. See coronal image 35 today. Compared to last month, the distal stomach and proximal duodenum appear mildly improved. No extraluminal gas or fluid identified. Proximal stomach and distal duodenum appear within normal limits. Spleen: Negative. Adrenals/Urinary Tract: Adrenal glands remain normal. Stable kidneys with bilateral parapelvic and exophytic renal cystic lesions which appeared benign by MRI in August of last year. Symmetric renal contrast excretion with no hydronephrosis or hydroureter. Diminutive and unremarkable urinary bladder. Stable pelvic phleboliths. Stomach/Bowel: Sigmoid diverticulosis again noted. No active inflammation identified. Otherwise redundant large bowel. Retained stool in the right colon and at the hepatic flexure. Normal appendix visible on series 4, image 60. Decompressed and negative terminal ileum. No dilated small bowel. Stomach and duodenum described above. No free air. No free fluid. Vascular/Lymphatic: Major arterial structures remain patent with tortuosity but minimal atherosclerosis. Portal venous system is patent. No lymphadenopathy. Reproductive: Stable lobulated fibroid uterus. Other: No pelvic free fluid. Musculoskeletal: Stable advanced lumbar disc and facet degeneration. No acute osseous abnormality identified. Review of the MIP images confirms the above findings. IMPRESSION: CHEST: 1. Negative for acute or residual pulmonary embolus. 2. Mild scattered pulmonary atelectasis and gas trapping. No other acute process in the chest. ABDOMEN AND PELVIS: 1. Largely stable CT appearance of the abdomen and pelvis since 10/15/2020 (please see also that report): 2. Stable cystic and  partially calcified pancreatic head mass, 4.3 cm. 3. Slightly improved appearance of the gastric antrum, duodenal bulb, and porta hepatis from last month. As before this may reflect improving peptic ulcer disease. Underlying chronic cholelithiasis, without strong evidence of acute cholecystitis today. 4. Fibroid uterus. Multiple renal cysts which had a benign MRI appearance last yea. Sigmoid diverticulosis. Electronically Signed   By: Odessa Fleming M.D.   On: 11/10/2020 06:10   CT ABDOMEN PELVIS W CONTRAST  Result Date: 11/10/2020 CLINICAL DATA:  65 year old female with right rib and flank pain. History of PE in September 2021. Abdominal pain with black tarry stools. History of cholelithiasis. History of exploratory laparotomy, perforated gastric ulcer in August 2021. EXAM: CT ANGIOGRAPHY CHEST CT ABDOMEN AND PELVIS WITH CONTRAST TECHNIQUE: Multidetector CT imaging of the chest was performed using the standard protocol during bolus administration of intravenous contrast. Multiplanar CT image reconstructions and MIPs were obtained to evaluate the vascular anatomy. Multidetector CT imaging of the abdomen and pelvis was performed using the standard protocol during bolus administration of intravenous contrast. CONTRAST:  OMNIPAQUE IOHEXOL 350 MG/ML SOLN COMPARISON:  CT Abdomen and Pelvis 10/15/2020 and earlier. CTA chest 05/01/2020. FINDINGS: CTA CHEST FINDINGS Cardiovascular: Good contrast bolus timing in the pulmonary arterial tree. No focal filling defect identified in the pulmonary arteries today to suggest acute or residual pulmonary embolism. Minimal calcified coronary artery atherosclerosis is evident. Mild cardiomegaly. No pericardial effusion. Minimal plaque of the thoracic aorta. Mediastinum/Nodes: Negative.  No lymphadenopathy. Lungs/Pleura: Somewhat lower lung volumes compared to last year. Resolved pleural effusions and lower lobe collapse or consolidation. Mild mosaic attenuation and mild peribronchial  streaky opacity in the lingula, favor a combination of  atelectasis and mild gas trapping. Major airways are patent. No areas suspicious for acute respiratory infection. Musculoskeletal: Chronic severe T10-T11 disc and endplate degeneration. Chronic severe degeneration at the visible shoulders. No acute osseous abnormality identified. Review of the MIP images confirms the above findings. CT ABDOMEN and PELVIS FINDINGS Hepatobiliary: Chronic gallstones individually up to 16 mm. Continued indistinct appearance of the porta hepatis which appears mildly inflamed. But at the gallbladder fundus no wall thickening or pericholecystic inflammation is evident. No hyperenhancement at the gallbladder fossa. Stable liver parenchyma including tiny benign appearing low-density area at the dome. No bile duct enlargement. Pancreas: Abnormal partially calcified multi cystic appearing pancreatic head mass as described on the CT last month appears stable measuring 4.3 cm on series 4, image 37. No main pancreatic ductal dilatation. No superimposed acute pancreatitis suspected. However, along with the indistinct porta hepatis the gastric antrum and proximal duodenum continue to appear indistinct and inflamed. See coronal image 35 today. Compared to last month, the distal stomach and proximal duodenum appear mildly improved. No extraluminal gas or fluid identified. Proximal stomach and distal duodenum appear within normal limits. Spleen: Negative. Adrenals/Urinary Tract: Adrenal glands remain normal. Stable kidneys with bilateral parapelvic and exophytic renal cystic lesions which appeared benign by MRI in August of last year. Symmetric renal contrast excretion with no hydronephrosis or hydroureter. Diminutive and unremarkable urinary bladder. Stable pelvic phleboliths. Stomach/Bowel: Sigmoid diverticulosis again noted. No active inflammation identified. Otherwise redundant large bowel. Retained stool in the right colon and at the hepatic  flexure. Normal appendix visible on series 4, image 60. Decompressed and negative terminal ileum. No dilated small bowel. Stomach and duodenum described above. No free air. No free fluid. Vascular/Lymphatic: Major arterial structures remain patent with tortuosity but minimal atherosclerosis. Portal venous system is patent. No lymphadenopathy. Reproductive: Stable lobulated fibroid uterus. Other: No pelvic free fluid. Musculoskeletal: Stable advanced lumbar disc and facet degeneration. No acute osseous abnormality identified. Review of the MIP images confirms the above findings. IMPRESSION: CHEST: 1. Negative for acute or residual pulmonary embolus. 2. Mild scattered pulmonary atelectasis and gas trapping. No other acute process in the chest. ABDOMEN AND PELVIS: 1. Largely stable CT appearance of the abdomen and pelvis since 10/15/2020 (please see also that report): 2. Stable cystic and partially calcified pancreatic head mass, 4.3 cm. 3. Slightly improved appearance of the gastric antrum, duodenal bulb, and porta hepatis from last month. As before this may reflect improving peptic ulcer disease. Underlying chronic cholelithiasis, without strong evidence of acute cholecystitis today. 4. Fibroid uterus. Multiple renal cysts which had a benign MRI appearance last yea. Sigmoid diverticulosis. Electronically Signed   By: Odessa Fleming M.D.   On: 11/10/2020 06:10   US Abdomen Limited  Result Date: 11/15/2020 CLINICAL DATA:  Right upper quadrant abdominal pain. EXAM: ULTRASOUND ABDOMEN LIMITED RIGHT UPPER QUADRANT COMPARISON:  November 14, 2020.  October 18, 2020. FINDINGS: Gallbladder: Cholelithiasis is noted as well as some degree of sludge within the gallbladder lumen. No gallbladder wall thickening or pericholecystic fluid is noted. The absence or presence of sonographic Murphy's sign was not reported by the technologist. Common bile duct: Diameter: 2 mm which is within normal limits. Liver: No focal lesion identified.  Within normal limits in parenchymal echogenicity. Portal vein is patent on color Doppler imaging with normal direction of blood flow towards the liver. Other: None. IMPRESSION: Cholelithiasis and gallbladder sludge is noted without gallbladder wall thickening or pericholecystic fluid. Electronically Signed   By: Zenda Alpers.D.  On: 11/15/2020 09:00   DG ABD ACUTE 2+V W 1V CHEST  Result Date: 11/11/2020 CLINICAL DATA:  65 year old female with persistent abdominal pain and vomiting. EXAM: DG ABDOMEN ACUTE WITH 1 VIEW CHEST COMPARISON:  CTA chest and CT Abdomen and Pelvis 11/10/2020, and earlier. FINDINGS: Portable AP upright view of the chest at 0724 hours. Lower lung volumes. No acute pulmonary opacity. Stable cardiac size and mediastinal contours. No pneumoperitoneum. Portable upright and supine views of the abdomen 0726 hours. Non obstructed bowel gas pattern. Cholelithiasis again noted. Pelvic vascular calcifications. No acute osseous abnormality identified. IMPRESSION: 1. Normal bowel gas pattern, no free air. Cholelithiasis again noted. 2. Lower lung volumes with no acute cardiopulmonary abnormality. Electronically Signed   By: Odessa Fleming M.D.   On: 11/11/2020 07:54   DG Abdomen Acute W/Chest  Result Date: 11/01/2020 CLINICAL DATA:  Abdominal pain with black, tarry stools. EXAM: DG ABDOMEN ACUTE WITH 1 VIEW CHEST COMPARISON:  October 18, 2020 FINDINGS: There is no evidence of dilated bowel loops or free intraperitoneal air. No radiopaque calculi are seen. A stable 1.1 cm soft tissue calcification is seen overlying the medial aspect of the mid to upper right abdomen. Heart size and mediastinal contours are within normal limits. Chronic appearing increased lung markings are seen without evidence of acute infiltrate. IMPRESSION: 1. No evidence of bowel obstruction or acute cardiopulmonary disease. 2. Stable soft tissue calcification within the abdomen which corresponds to the partially calcified  pancreatic mass seen on prior studies. Electronically Signed   By: Aram Candela M.D.   On: 11/01/2020 23:55   MM DIAG BREAST TOMO BILATERAL  Result Date: 11/12/2020 CLINICAL DATA:  65 year old female presenting for annual bilateral mammogram and 1 year follow-up of probably benign right breast calcifications. EXAM: DIGITAL DIAGNOSTIC BILATERAL MAMMOGRAM WITH TOMOSYNTHESIS AND CAD TECHNIQUE: Bilateral digital diagnostic mammography and breast tomosynthesis was performed. The images were evaluated with computer-aided detection. COMPARISON:  Previous exam(s). ACR Breast Density Category b: There are scattered areas of fibroglandular density. FINDINGS: A 9 mm group of coarse calcifications in the upper-outer right breast are mammographically stable. No new or suspicious mammographic findings are identified in the remainder of either breast. IMPRESSION: 1. Stable, probably benign right breast calcifications. Recommendation is for a final mammographic follow-up in 1 year. 2. No mammographic evidence of malignancy on the left. RECOMMENDATION: Bilateral diagnostic mammogram in 1 year. I have discussed the findings and recommendations with the patient. If applicable, a reminder letter will be sent to the patient regarding the next appointment. BI-RADS CATEGORY  3: Probably benign. Electronically Signed   By: Sande Brothers M.D.   On: 11/12/2020 14:23   CT Renal Stone Study  Result Date: 11/14/2020 CLINICAL DATA:  Right flank pain EXAM: CT ABDOMEN AND PELVIS WITHOUT CONTRAST TECHNIQUE: Multidetector CT imaging of the abdomen and pelvis was performed following the standard protocol without IV contrast. COMPARISON:  11/10/2020 FINDINGS: Lower chest: Scarring in the lingula. Small hiatal hernia. No effusions. Hepatobiliary: Multiple gallstones within the gallbladder measuring up 2 17 mm. Small hypodensity in the right hepatic dome compatible with cyst. Pancreas: Partially calcified cystic mass again seen within the  pancreatic head measures 4.2 cm, stable. No pancreatic ductal dilatation. Spleen: No focal abnormality.  Normal size. Adrenals/Urinary Tract: Multiple cystic lesions within the kidneys bilaterally. No hydronephrosis. Adrenal glands and urinary bladder unremarkable. Stomach/Bowel: Large stool burden throughout the colon. Normal appendix. Stomach is decompressed. Mildly prominent fluid-filled right abdominal small bowel loops. Terminal ileum is decompressed. Vascular/Lymphatic: No  evidence of aneurysm or adenopathy. Reproductive: Fibroid uterus.  No adnexal mass. Other: No free fluid or free air. Musculoskeletal: No acute bony abnormality. IMPRESSION: Cholelithiasis. Stable partially calcified cystic mass within the pancreatic head. Stable bilateral renal cystic lesions. No renal or ureteral stones. No hydronephrosis. Large stool burden throughout the colon. Fibroid uterus. Electronically Signed   By: Charlett Nose M.D.   On: 11/14/2020 23:55        Discharge Exam: Vitals:   11/27/20 0500 11/27/20 0817  BP: 139/76 (!) 156/80  Pulse: (!) 55 (!) 50  Resp: 16 17  Temp: 98.1 F (36.7 C) 98.2 F (36.8 C)  SpO2: 95% 99%   Vitals:   11/26/20 0804 11/26/20 2004 11/27/20 0500 11/27/20 0817  BP: (!) 149/72 140/88 139/76 (!) 156/80  Pulse:  (!) 54 (!) 55 (!) 50  Resp:  19 16 17   Temp:  98.5 F (36.9 C) 98.1 F (36.7 C) 98.2 F (36.8 C)  TempSrc:  Oral Oral Oral  SpO2:  98% 95% 99%  Weight:      Height:        General: Pt is alert, awake, not in acute distress Cardiovascular: RRR, S1/S2 +, no rubs, no gallops Respiratory: CTA bilaterally, no wheezing, no rhonchi Abdominal: Soft, RUQ/ epigastric, ND, bowel sounds + Extremities: no edema, no cyanosis   The results of significant diagnostics from this hospitalization (including imaging, microbiology, ancillary and laboratory) are listed below for reference.    Significant Diagnostic Studies: CT ABDOMEN PELVIS WO CONTRAST  Result Date:  11/22/2020 CLINICAL DATA:  Right lower quadrant pain EXAM: CT ABDOMEN AND PELVIS WITHOUT CONTRAST TECHNIQUE: Multidetector CT imaging of the abdomen and pelvis was performed following the standard protocol without IV contrast. COMPARISON:  Ultrasound 01/15/2021, CT 11/14/2020, 11/10/2020, 04/20/2020 FINDINGS: Lower chest: Lung bases demonstrate no acute consolidation or effusion. Linear atelectasis or scarring at the lingula. Mild cardiomegaly. Small hiatal hernia. Hepatobiliary: Stable subcentimeter hypodensity in the liver, too small to further characterize. Multiple calcified gallstones. No biliary dilatation. Pancreas: No definite acute inflammatory change. Calcifications at the head of pancreas. Cystic lesion measuring 4.1 by 2.9 cm, previously 4.2 cm. Spleen: Normal in size without focal abnormality. Adrenals/Urinary Tract: Adrenal glands within normal limits. Cysts within the kidneys. Rim calcified cyst exophytic upper pole right kidney without change. No hydronephrosis. The bladder is normal Stomach/Bowel: Stomach is within normal limits. Appendix appears normal. No evidence of bowel wall thickening, distention, or inflammatory changes. Vascular/Lymphatic: Nonaneurysmal aorta. Mild aortic atherosclerosis. No suspicious nodes Reproductive: Multiple uterine masses consistent with fibroids. Other: Negative for free air or free fluid Musculoskeletal: Grade 1 anterolisthesis L4 on L5. Degenerative changes of the lumbar spine. No acute osseous abnormality IMPRESSION: 1. Negative for acute appendicitis. 2. Gallstones. No biliary dilatation. 3. Stable 4.1 cm cystic lesion and calcification within the head of the pancreas. 4. Fibroid uterus. Aortic Atherosclerosis (ICD10-I70.0). Electronically Signed   By: Jasmine Pang M.D.   On: 11/22/2020 23:17   CT Angio Chest PE W and/or Wo Contrast  Result Date: 11/10/2020 CLINICAL DATA:  65 year old female with right rib and flank pain. History of PE in September 2021.  Abdominal pain with black tarry stools. History of cholelithiasis. History of exploratory laparotomy, perforated gastric ulcer in August 2021. EXAM: CT ANGIOGRAPHY CHEST CT ABDOMEN AND PELVIS WITH CONTRAST TECHNIQUE: Multidetector CT imaging of the chest was performed using the standard protocol during bolus administration of intravenous contrast. Multiplanar CT image reconstructions and MIPs were obtained to evaluate the vascular  anatomy. Multidetector CT imaging of the abdomen and pelvis was performed using the standard protocol during bolus administration of intravenous contrast. CONTRAST:  OMNIPAQUE IOHEXOL 350 MG/ML SOLN COMPARISON:  CT Abdomen and Pelvis 10/15/2020 and earlier. CTA chest 05/01/2020. FINDINGS: CTA CHEST FINDINGS Cardiovascular: Good contrast bolus timing in the pulmonary arterial tree. No focal filling defect identified in the pulmonary arteries today to suggest acute or residual pulmonary embolism. Minimal calcified coronary artery atherosclerosis is evident. Mild cardiomegaly. No pericardial effusion. Minimal plaque of the thoracic aorta. Mediastinum/Nodes: Negative.  No lymphadenopathy. Lungs/Pleura: Somewhat lower lung volumes compared to last year. Resolved pleural effusions and lower lobe collapse or consolidation. Mild mosaic attenuation and mild peribronchial streaky opacity in the lingula, favor a combination of atelectasis and mild gas trapping. Major airways are patent. No areas suspicious for acute respiratory infection. Musculoskeletal: Chronic severe T10-T11 disc and endplate degeneration. Chronic severe degeneration at the visible shoulders. No acute osseous abnormality identified. Review of the MIP images confirms the above findings. CT ABDOMEN and PELVIS FINDINGS Hepatobiliary: Chronic gallstones individually up to 16 mm. Continued indistinct appearance of the porta hepatis which appears mildly inflamed. But at the gallbladder fundus no wall thickening or pericholecystic  inflammation is evident. No hyperenhancement at the gallbladder fossa. Stable liver parenchyma including tiny benign appearing low-density area at the dome. No bile duct enlargement. Pancreas: Abnormal partially calcified multi cystic appearing pancreatic head mass as described on the CT last month appears stable measuring 4.3 cm on series 4, image 37. No main pancreatic ductal dilatation. No superimposed acute pancreatitis suspected. However, along with the indistinct porta hepatis the gastric antrum and proximal duodenum continue to appear indistinct and inflamed. See coronal image 35 today. Compared to last month, the distal stomach and proximal duodenum appear mildly improved. No extraluminal gas or fluid identified. Proximal stomach and distal duodenum appear within normal limits. Spleen: Negative. Adrenals/Urinary Tract: Adrenal glands remain normal. Stable kidneys with bilateral parapelvic and exophytic renal cystic lesions which appeared benign by MRI in August of last year. Symmetric renal contrast excretion with no hydronephrosis or hydroureter. Diminutive and unremarkable urinary bladder. Stable pelvic phleboliths. Stomach/Bowel: Sigmoid diverticulosis again noted. No active inflammation identified. Otherwise redundant large bowel. Retained stool in the right colon and at the hepatic flexure. Normal appendix visible on series 4, image 60. Decompressed and negative terminal ileum. No dilated small bowel. Stomach and duodenum described above. No free air. No free fluid. Vascular/Lymphatic: Major arterial structures remain patent with tortuosity but minimal atherosclerosis. Portal venous system is patent. No lymphadenopathy. Reproductive: Stable lobulated fibroid uterus. Other: No pelvic free fluid. Musculoskeletal: Stable advanced lumbar disc and facet degeneration. No acute osseous abnormality identified. Review of the MIP images confirms the above findings. IMPRESSION: CHEST: 1. Negative for acute or  residual pulmonary embolus. 2. Mild scattered pulmonary atelectasis and gas trapping. No other acute process in the chest. ABDOMEN AND PELVIS: 1. Largely stable CT appearance of the abdomen and pelvis since 10/15/2020 (please see also that report): 2. Stable cystic and partially calcified pancreatic head mass, 4.3 cm. 3. Slightly improved appearance of the gastric antrum, duodenal bulb, and porta hepatis from last month. As before this may reflect improving peptic ulcer disease. Underlying chronic cholelithiasis, without strong evidence of acute cholecystitis today. 4. Fibroid uterus. Multiple renal cysts which had a benign MRI appearance last yea. Sigmoid diverticulosis. Electronically Signed   By: Odessa Fleming M.D.   On: 11/10/2020 06:10   CT ABDOMEN PELVIS W CONTRAST  Result  Date: 11/10/2020 CLINICAL DATA:  65 year old female with right rib and flank pain. History of PE in September 2021. Abdominal pain with black tarry stools. History of cholelithiasis. History of exploratory laparotomy, perforated gastric ulcer in August 2021. EXAM: CT ANGIOGRAPHY CHEST CT ABDOMEN AND PELVIS WITH CONTRAST TECHNIQUE: Multidetector CT imaging of the chest was performed using the standard protocol during bolus administration of intravenous contrast. Multiplanar CT image reconstructions and MIPs were obtained to evaluate the vascular anatomy. Multidetector CT imaging of the abdomen and pelvis was performed using the standard protocol during bolus administration of intravenous contrast. CONTRAST:  OMNIPAQUE IOHEXOL 350 MG/ML SOLN COMPARISON:  CT Abdomen and Pelvis 10/15/2020 and earlier. CTA chest 05/01/2020. FINDINGS: CTA CHEST FINDINGS Cardiovascular: Good contrast bolus timing in the pulmonary arterial tree. No focal filling defect identified in the pulmonary arteries today to suggest acute or residual pulmonary embolism. Minimal calcified coronary artery atherosclerosis is evident. Mild cardiomegaly. No pericardial  effusion. Minimal plaque of the thoracic aorta. Mediastinum/Nodes: Negative.  No lymphadenopathy. Lungs/Pleura: Somewhat lower lung volumes compared to last year. Resolved pleural effusions and lower lobe collapse or consolidation. Mild mosaic attenuation and mild peribronchial streaky opacity in the lingula, favor a combination of atelectasis and mild gas trapping. Major airways are patent. No areas suspicious for acute respiratory infection. Musculoskeletal: Chronic severe T10-T11 disc and endplate degeneration. Chronic severe degeneration at the visible shoulders. No acute osseous abnormality identified. Review of the MIP images confirms the above findings. CT ABDOMEN and PELVIS FINDINGS Hepatobiliary: Chronic gallstones individually up to 16 mm. Continued indistinct appearance of the porta hepatis which appears mildly inflamed. But at the gallbladder fundus no wall thickening or pericholecystic inflammation is evident. No hyperenhancement at the gallbladder fossa. Stable liver parenchyma including tiny benign appearing low-density area at the dome. No bile duct enlargement. Pancreas: Abnormal partially calcified multi cystic appearing pancreatic head mass as described on the CT last month appears stable measuring 4.3 cm on series 4, image 37. No main pancreatic ductal dilatation. No superimposed acute pancreatitis suspected. However, along with the indistinct porta hepatis the gastric antrum and proximal duodenum continue to appear indistinct and inflamed. See coronal image 35 today. Compared to last month, the distal stomach and proximal duodenum appear mildly improved. No extraluminal gas or fluid identified. Proximal stomach and distal duodenum appear within normal limits. Spleen: Negative. Adrenals/Urinary Tract: Adrenal glands remain normal. Stable kidneys with bilateral parapelvic and exophytic renal cystic lesions which appeared benign by MRI in August of last year. Symmetric renal contrast excretion with  no hydronephrosis or hydroureter. Diminutive and unremarkable urinary bladder. Stable pelvic phleboliths. Stomach/Bowel: Sigmoid diverticulosis again noted. No active inflammation identified. Otherwise redundant large bowel. Retained stool in the right colon and at the hepatic flexure. Normal appendix visible on series 4, image 60. Decompressed and negative terminal ileum. No dilated small bowel. Stomach and duodenum described above. No free air. No free fluid. Vascular/Lymphatic: Major arterial structures remain patent with tortuosity but minimal atherosclerosis. Portal venous system is patent. No lymphadenopathy. Reproductive: Stable lobulated fibroid uterus. Other: No pelvic free fluid. Musculoskeletal: Stable advanced lumbar disc and facet degeneration. No acute osseous abnormality identified. Review of the MIP images confirms the above findings. IMPRESSION: CHEST: 1. Negative for acute or residual pulmonary embolus. 2. Mild scattered pulmonary atelectasis and gas trapping. No other acute process in the chest. ABDOMEN AND PELVIS: 1. Largely stable CT appearance of the abdomen and pelvis since 10/15/2020 (please see also that report): 2. Stable cystic and partially calcified  pancreatic head mass, 4.3 cm. 3. Slightly improved appearance of the gastric antrum, duodenal bulb, and porta hepatis from last month. As before this may reflect improving peptic ulcer disease. Underlying chronic cholelithiasis, without strong evidence of acute cholecystitis today. 4. Fibroid uterus. Multiple renal cysts which had a benign MRI appearance last yea. Sigmoid diverticulosis. Electronically Signed   By: Odessa Fleming M.D.   On: 11/10/2020 06:10   US Abdomen Limited  Result Date: 11/15/2020 CLINICAL DATA:  Right upper quadrant abdominal pain. EXAM: ULTRASOUND ABDOMEN LIMITED RIGHT UPPER QUADRANT COMPARISON:  November 14, 2020.  October 18, 2020. FINDINGS: Gallbladder: Cholelithiasis is noted as well as some degree of sludge within the  gallbladder lumen. No gallbladder wall thickening or pericholecystic fluid is noted. The absence or presence of sonographic Murphy's sign was not reported by the technologist. Common bile duct: Diameter: 2 mm which is within normal limits. Liver: No focal lesion identified. Within normal limits in parenchymal echogenicity. Portal vein is patent on color Doppler imaging with normal direction of blood flow towards the liver. Other: None. IMPRESSION: Cholelithiasis and gallbladder sludge is noted without gallbladder wall thickening or pericholecystic fluid. Electronically Signed   By: Lupita Raider M.D.   On: 11/15/2020 09:00   DG ABD ACUTE 2+V W 1V CHEST  Result Date: 11/11/2020 CLINICAL DATA:  65 year old female with persistent abdominal pain and vomiting. EXAM: DG ABDOMEN ACUTE WITH 1 VIEW CHEST COMPARISON:  CTA chest and CT Abdomen and Pelvis 11/10/2020, and earlier. FINDINGS: Portable AP upright view of the chest at 0724 hours. Lower lung volumes. No acute pulmonary opacity. Stable cardiac size and mediastinal contours. No pneumoperitoneum. Portable upright and supine views of the abdomen 0726 hours. Non obstructed bowel gas pattern. Cholelithiasis again noted. Pelvic vascular calcifications. No acute osseous abnormality identified. IMPRESSION: 1. Normal bowel gas pattern, no free air. Cholelithiasis again noted. 2. Lower lung volumes with no acute cardiopulmonary abnormality. Electronically Signed   By: Odessa Fleming M.D.   On: 11/11/2020 07:54   DG Abdomen Acute W/Chest  Result Date: 11/01/2020 CLINICAL DATA:  Abdominal pain with black, tarry stools. EXAM: DG ABDOMEN ACUTE WITH 1 VIEW CHEST COMPARISON:  October 18, 2020 FINDINGS: There is no evidence of dilated bowel loops or free intraperitoneal air. No radiopaque calculi are seen. A stable 1.1 cm soft tissue calcification is seen overlying the medial aspect of the mid to upper right abdomen. Heart size and mediastinal contours are within normal limits.  Chronic appearing increased lung markings are seen without evidence of acute infiltrate. IMPRESSION: 1. No evidence of bowel obstruction or acute cardiopulmonary disease. 2. Stable soft tissue calcification within the abdomen which corresponds to the partially calcified pancreatic mass seen on prior studies. Electronically Signed   By: Aram Candela M.D.   On: 11/01/2020 23:55   MM DIAG BREAST TOMO BILATERAL  Result Date: 11/12/2020 CLINICAL DATA:  65 year old female presenting for annual bilateral mammogram and 1 year follow-up of probably benign right breast calcifications. EXAM: DIGITAL DIAGNOSTIC BILATERAL MAMMOGRAM WITH TOMOSYNTHESIS AND CAD TECHNIQUE: Bilateral digital diagnostic mammography and breast tomosynthesis was performed. The images were evaluated with computer-aided detection. COMPARISON:  Previous exam(s). ACR Breast Density Category b: There are scattered areas of fibroglandular density. FINDINGS: A 9 mm group of coarse calcifications in the upper-outer right breast are mammographically stable. No new or suspicious mammographic findings are identified in the remainder of either breast. IMPRESSION: 1. Stable, probably benign right breast calcifications. Recommendation is for a final mammographic follow-up in  1 year. 2. No mammographic evidence of malignancy on the left. RECOMMENDATION: Bilateral diagnostic mammogram in 1 year. I have discussed the findings and recommendations with the patient. If applicable, a reminder letter will be sent to the patient regarding the next appointment. BI-RADS CATEGORY  3: Probably benign. Electronically Signed   By: Sande Brothers M.D.   On: 11/12/2020 14:23   CT Renal Stone Study  Result Date: 11/14/2020 CLINICAL DATA:  Right flank pain EXAM: CT ABDOMEN AND PELVIS WITHOUT CONTRAST TECHNIQUE: Multidetector CT imaging of the abdomen and pelvis was performed following the standard protocol without IV contrast. COMPARISON:  11/10/2020 FINDINGS: Lower chest:  Scarring in the lingula. Small hiatal hernia. No effusions. Hepatobiliary: Multiple gallstones within the gallbladder measuring up 2 17 mm. Small hypodensity in the right hepatic dome compatible with cyst. Pancreas: Partially calcified cystic mass again seen within the pancreatic head measures 4.2 cm, stable. No pancreatic ductal dilatation. Spleen: No focal abnormality.  Normal size. Adrenals/Urinary Tract: Multiple cystic lesions within the kidneys bilaterally. No hydronephrosis. Adrenal glands and urinary bladder unremarkable. Stomach/Bowel: Large stool burden throughout the colon. Normal appendix. Stomach is decompressed. Mildly prominent fluid-filled right abdominal small bowel loops. Terminal ileum is decompressed. Vascular/Lymphatic: No evidence of aneurysm or adenopathy. Reproductive: Fibroid uterus.  No adnexal mass. Other: No free fluid or free air. Musculoskeletal: No acute bony abnormality. IMPRESSION: Cholelithiasis. Stable partially calcified cystic mass within the pancreatic head. Stable bilateral renal cystic lesions. No renal or ureteral stones. No hydronephrosis. Large stool burden throughout the colon. Fibroid uterus. Electronically Signed   By: Charlett Nose M.D.   On: 11/14/2020 23:55     Microbiology: Recent Results (from the past 240 hour(s))  SARS CORONAVIRUS 2 (Talicia Sui 6-24 HRS) Nasopharyngeal Nasopharyngeal Swab     Status: None   Collection Time: 11/23/20 12:06 AM   Specimen: Nasopharyngeal Swab  Result Value Ref Range Status   SARS Coronavirus 2 NEGATIVE NEGATIVE Final    Comment: (NOTE) SARS-CoV-2 target nucleic acids are NOT DETECTED.  The SARS-CoV-2 RNA is generally detectable in upper and lower respiratory specimens during the acute phase of infection. Negative results do not preclude SARS-CoV-2 infection, do not rule out co-infections with other pathogens, and should not be used as the sole basis for treatment or other patient management decisions. Negative results must  be combined with clinical observations, patient history, and epidemiological information. The expected result is Negative.  Fact Sheet for Patients: HairSlick.no  Fact Sheet for Healthcare Providers: quierodirigir.com  This test is not yet approved or cleared by the [place] FDA and  has been authorized for detection and/or diagnosis of SARS-CoV-2 by FDA under an Emergency Use Authorization (EUA). This EUA will remain  in effect (meaning this test can be used) for the duration of the COVID-19 declaration under Se ction 564(b)(1) of the Act, 21 U.S.C. section 360bbb-3(b)(1), unless the authorization is terminated or revoked sooner.  Performed at [facility], [address], [place], [place] [address]      Labs: Basic Metabolic Panel: Recent Labs  Lab 11/23/20 [phone] 11/24/20 0837 11/25/20 0508 11/26/20 0432 11/27/20 0420  NA 140 142 144 143 142  K 3.3* 3.2* 3.1* 3.3* 3.4*  CL 108 112* 113* 112* 110  CO2 23 23 22 24 23   GLUCOSE 98 82 90 90 103*  BUN 10 7* 6* 5* 9  CREATININE 0.61 0.65 0.65 0.72 0.83  CALCIUM 8.2* 8.3* 8.5* 8.3* 8.5*  MG 1.8  --   --   --   --  Liver Function Tests: Recent Labs  Lab 11/22/20 2206 11/23/20 0607 11/24/20 0837  AST 22 13* 15  ALT 8 7 7   ALKPHOS 51 39 39  BILITOT 0.4 0.4 0.6  PROT 7.3 5.5* 5.8*  ALBUMIN 4.0 3.1* 3.1*   Recent Labs  Lab 11/22/20 2206  LIPASE 31   No results for input(s): AMMONIA in the last 168 hours. CBC: Recent Labs  Lab 11/22/20 2206 11/23/20 0607  WBC 8.9 6.0  NEUTROABS 6.9  --   HGB 9.8* 8.6*  HCT 30.8* 26.8*  MCV 87.3 87.3  PLT [id number]   Cardiac Enzymes: No results for input(s): CKTOTAL, CKMB, CKMBINDEX, TROPONINI in the last 168 hours. BNP: Invalid input(s): POCBNP CBG: No results for input(s): GLUCAP in the last 168 hours.  Time coordinating discharge:  36 minutes  Signed:  Catarina Hartshorn, DO [facility] Pager:  [phone] 11/27/2020, 5:54 PM

## 2020-11-27 NOTE — Plan of Care (Signed)

## 2020-11-27 NOTE — Telephone Encounter (Signed)
Maria Murphy, please arrange follow-up in 3-4 weeks with any APP. We have all seen her inpatient
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
## 2020-11-28 DIAGNOSIS — I10 Essential (primary) hypertension: Secondary | ICD-10-CM | POA: Diagnosis not present

## 2020-11-28 DIAGNOSIS — R1010 Upper abdominal pain, unspecified: Secondary | ICD-10-CM | POA: Diagnosis not present

## 2020-11-28 DIAGNOSIS — E876 Hypokalemia: Secondary | ICD-10-CM | POA: Diagnosis not present

## 2020-11-28 DIAGNOSIS — K277 Chronic peptic ulcer, site unspecified, without hemorrhage or perforation: Secondary | ICD-10-CM | POA: Diagnosis not present

## 2020-11-28 LAB — CBC
HCT: 29.1 % — ABNORMAL LOW (ref 36.0–46.0)
Hemoglobin: 9.4 g/dL — ABNORMAL LOW (ref 12.0–15.0)
MCH: 27.9 pg (ref 26.0–34.0)
MCHC: 32.3 g/dL (ref 30.0–36.0)
MCV: 86.4 fL (ref 80.0–100.0)
Platelets: 150 10*3/uL (ref 150–400)
RBC: 3.37 MIL/uL — ABNORMAL LOW (ref 3.87–5.11)
RDW: 14.9 % (ref 11.5–15.5)
WBC: 3.5 10*3/uL — ABNORMAL LOW (ref 4.0–10.5)
nRBC: 0 % (ref 0.0–0.2)

## 2020-11-28 LAB — MAGNESIUM: Magnesium: 1.7 mg/dL (ref 1.7–2.4)

## 2020-11-28 LAB — BASIC METABOLIC PANEL
Anion gap: 7 (ref 5–15)
BUN: 8 mg/dL (ref 8–23)
CO2: 25 mmol/L (ref 22–32)
Calcium: 8.6 mg/dL — ABNORMAL LOW (ref 8.9–10.3)
Chloride: 110 mmol/L (ref 98–111)
Creatinine, Ser: 0.7 mg/dL (ref 0.44–1.00)
GFR, Estimated: >60 mL/min (ref >60)
Glucose, Bld: 98 mg/dL (ref 70–99)
Potassium: 3.2 mmol/L — ABNORMAL LOW (ref 3.5–5.1)
Sodium: 142 mmol/L (ref 135–145)

## 2020-11-28 MED ORDER — OXYCODONE HCL 5 MG PO TABS: 5.0000 mg | ORAL_TABLET | ORAL | 0 refills | Status: DC | PRN

## 2020-11-28 MED ORDER — POTASSIUM CHLORIDE CRYS ER 20 MEQ PO TBCR
20.0000 meq | EXTENDED_RELEASE_TABLET | Freq: Once | ORAL | Status: AC
  Administered 2020-11-28: 20 meq via ORAL
  Filled 2020-11-28: qty 1

## 2020-11-28 MED ORDER — MAGNESIUM OXIDE 400 (241.3 MG) MG PO TABS
400.0000 mg | ORAL_TABLET | Freq: Every day | ORAL | Status: DC
  Administered 2020-11-28: 400 mg via ORAL
  Filled 2020-11-28: qty 1

## 2020-11-28 NOTE — Plan of Care (Signed)

## 2020-11-28 NOTE — Discharge Summary (Addendum)
Physician Discharge Summary  Maria Murphy ZOX:[id number] DOB: Aug 06, 1956 DOA: 11/22/2020  PCP: Marcine Matar, MD  Admit date: 11/22/2020 Discharge date: 11/28/2020  Admitted From: Home Disposition:  Home  Recommendations for Outpatient Follow-up:  1. Follow up with PCP in 1-2 weeks 2. Please obtain BMP/CBC in one week    Discharge Condition: Stable CODE STATUS: FULL Diet recommendation: FULL liquids with Boost supplement   Brief/Interim Summary: 65 year old female with history of hypertension, peptic ulcer disease with perforation, saddle pulmonary embolus on rivaroxabanuntil recently on 11/03/20, depression presenting with 1-day history of intermittent abdominal pain and nausea and vomiting. She describes her abd pain as severe and sharp, primarily in RUQ and R-flank.She denies any NSAID use. She has not had any fevers, chills, dysuria, hematuria, hematochezia, melena, diarrhea. She denies any frank chest pain, shortness breath, hemoptysis, coughing, headache, neck pain,rashes.She denies any recent injury or trauma.  Patient has had numerous emergency department visits and hospital admissions. Most recently she was admitted from 11/14/20 to 11/18/20 with abdominal pain and n/v again.  This was felt to bemultifactorialsecondary to her peptic ulcer disease, constipation and possible symptomatic cholelithiasis.   Since d/c, she underwent EGD and EUS by Dr. Meridee Score on 11/20/20.  There waspresence of grade C esophagitis in the esophagus in the distal area, at 3 continue to high hernia was present, few small (largest 8 mm) cratered ulcers were found in the gastric body and antrum with a clean ulcer base. There was deformity of the stomach and the pylorus and the proximal duodenal bulb. Biopsies were taken from the stomach to rule out H. pylori which came back neg for malinancy. There was presence of nonbleeding cratered duodenal ulcer with a clean base in the duodenal bulb  extending up to 22 mm, there was presence of suture material. There was presence of acquired stenosis due to the previous surgical intervention but the scope could be passed down. Biopsies from the duodenal ulcer edges were obtained. Endoscopic ultrasound no show any abnormality of the genu of the pancreas, pancreatic body and pancreatic tail however the evaluation of the cystic lesion located in the head of the pancreas could not be assessed thoroughly as a result of the large duodenal ulcer. There were no malignant appearing lymph nodes in the celiac lesion.  Again, patient was admitted to the hospital from 11/01/2020 to 11/03/2020 for another GI bleed. The patient was transfused 1 unit PRBC. She was discharged with a hemoglobin 9.3. During that hospital admission, the decision was made to discontinue her therapeutic dosing of rivaroxaban. Although the patient was initially diagnosed with her PE on 05/01/2020, the patient had endorsed poor compliance with her rivaroxaban until the beginning of November 2021 after which she had been taking it faithfully. Therefore, the target date for stopping her rivaroxaban was initially targeted around the end of April 2022. However given the patient's recurrent GI bleeds and the fact that she had finished essentially 5 continuous months of rivaroxaban, the decision was made to discontinue therapeutic dosing and start the patient on rivaroxaban 10 mg daily at the time of her discharge on 11/03/2020. Unfortunately, as consistent with her previous history the patient had some type of misunderstanding and has not restarted her rivaroxaban in the interim since her discharge on 11/03/2020.  The patient has had multiple emergency department visits. Once again, the patient had a recent ED visit on 11/10/2020 with similar right flank pain. CT of the abdomen and pelvis at that time was unremarkable, and the patient  was discharged home in stable condition. She had a CT of the  abdomen and pelvis on 10/15/2020 which showed a multicystic partially calcified mass in the pancreatic head measuring up to 43 mm. There was also more prominent main pancreatic duct.There is also an inflamed appearance of the distal stomach and proximal duodenum with adjacent fat stranding that was increased since 09/24/2020. There is mild inflamed appearance of the gallbladder and porta hepatis. The patient was discharged home in stable condition after fluids and conservative care in the emergency department with a prescription for oxycodone.   Notably, the patient has had a complicated history of peptic ulcer disease secondary to NSAID use. She had a prolonged hospitalization from 04/21/2020 to 05/17/2020 secondary to a perforated gastric ulcer. She underwent exploratory laparotomy with Cheree Ditto patch placed on 04/22/2021. This was complicated by intra-abdominal abscess for which required IR aspiration. That hospitalization was also complicated by a pulmonary embolus for which the patient was started on rivaroxaban. She was subsequently remitted to the hospital on 07/05/2020 to 07/12/2020 for intractable nausea and vomiting. Fortunately, the patient improved with conservative/nonoperative management. She underwent EGD on 07/10/2020 which showed a scar in the gastric antrum and prepyloric region. There was nonbleeding duodenal ulcers. Once again, the patient was readmitted from 09/23/2020 to2/4/22 for abdominal pain and intractable nausea and vomiting. Again, the patient improved with conservative care.  The patient was placed on bowel rest and started on cathartics with some improvement in her abd pain. Her diet was advanced, but she had some worsen abd pain. She was downgraded to full liquids with she tolerated.   Discharge Diagnoses:  IntractableRightabdominal pain/vomiting -This appears to be an exacerbation of her chronic upper and R-sided abd pain -Although she does not specifically  endorse "chronic" Right side abd pain, review of her extensive medical records shows she has had this type and location of pain on several prior occasions -Suspect this ismultifactorialsecondary to her peptic ulcer disease, constipation and possible symptomatic cholelithiasis -Continue Protonixand carafate -11/20/20 EGD and EUS as discussed above -10/18/20 EGD--nonbleeding gastric ulcers;large duodenal ulcer in the duodenal bulb, close to 35 mm in diameter;no presence of stigmata of recent bleeding but there was presence of a suture at the bed of the ulcer which was semicircumferential -Avoid NSAIDs -11/22/20 CT abd--no acute findings.  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 Needs referral to Dr. Donell Beers vs tertiary care center -discussed with GI--stick with full liquids for now with Boost supplement--ok to go home with this -pt has difficulty tolerating solid food   Inflamed gallbladder appearance--noted on prior imaging -09/18/2020 HIDA scan normal -10/18/20 RUQ US--Cholelithiasis and mild gallbladder wall thickening.+murphy's -Previously seen by surgery who felt she would need cholecystectomy at some point -RUQ [place] 11/15/20--Cholelithiasis and gallbladder sludge is noted without gallbladder wall thickening or pericholecystic fluid.  Pancreatic head mass -This has been followed by surveillance by GI -EUS 11/20/20 by Dr. Reina Fuse not show any abnormality of the genu of the pancreas, pancreatic body and pancreatic tail however the evaluation of the  cystic lesion located in the head of the pancreas could not be assessed thoroughly as a result  of the large duodenal ulcer -repeat EUS after ulcer is healed   ABLA -baseline Hgb 10-11 -Hgb now stable--9.8  History of pulmonary embolus -Patient had submassive/saddle embolus on CTA on 05/01/2020 -Temporarily holding rivaroxaban--restart at 10 mg daily  -patient has been taking faithfully since beginning of Nov 2021 -plan 6 full months of tx-->prior targetend of Aprilbut stopped on 11/03/20 as discussed above  Essential hypertension -continue amlodipine -Monitor BP -IV labetalol as neededSBP >180  Depression -RestartedCelexa and trazodone once able to tolerate p.o. -RestartedRemeron once able to tolerate p.o.  Hypokalemia -replete   Discharge Instructions  Discharge Instructions    Ambulatory referral to Physical Therapy   Complete by: As directed      Allergies as of 11/28/2020      Reactions   Wheat Extract Swelling      Medication List    TAKE these medications   acetaminophen 500 MG tablet Commonly known as: TYLENOL Take 1 tablet (500 mg total) by mouth every 6 (six) hours as needed for mild pain or fever. What changed:   how much to take  when to take this   amLODipine 2.5 MG tablet Commonly known as: NORVASC Take 1 tablet (2.5 mg total) by mouth daily. For BP   citalopram 20 MG tablet Commonly known as: CELEXA Take 20 mg by mouth daily.   dicyclomine 10 MG capsule Commonly known as: BENTYL Take 10 mg by mouth 2 (two) times daily.   donepezil 10 MG tablet Commonly known as: ARICEPT Take 0.5-1 tablets (5-10 mg total) by mouth in the morning and at bedtime. What changed: how much to take   famotidine 20 MG tablet Commonly known as: Pepcid Take 1 tablet (20 mg total) by mouth 2 (two) times daily.   ferrous sulfate 325 (65 FE) MG tablet Take 1 tablet (325 mg total) by mouth daily with breakfast.   gabapentin 300 MG capsule Commonly known  as: NEURONTIN Take 1 capsule (300 mg total) by mouth 2 (two) times daily.   lactose free nutrition Liqd Take 237 mLs by mouth 2 (two) times daily.   lipase/protease/amylase 04540 UNITS Cpep capsule Commonly known as: Creon Take 2 capsules (72,000 Units total) by mouth 3 (three) times daily with meals. May also take 1 capsule (36,000 Units total) as needed (with snacks).   mirtazapine 15 MG tablet Commonly known as: Remeron Take 1 tablet (15 mg total) by mouth at bedtime.   ondansetron 8 MG disintegrating tablet Commonly known as: Zofran ODT Take 1 tablet (8 mg total) by mouth every 8 (eight) hours as needed for nausea or vomiting.   pantoprazole 40 MG tablet Commonly known as: PROTONIX Take 1 tablet (40 mg total) by mouth 2 (two) times daily.   promethazine 25 MG suppository Commonly known as: PHENERGAN Place 1 suppository (25 mg total) rectally every 6 (six) hours as needed for nausea or vomiting.   promethazine 25 MG tablet Commonly known as: PHENERGAN Take 1 tablet (25 mg total) by mouth every 6 (six) hours as needed for nausea or vomiting.   rivaroxaban 10 MG Tabs tablet Commonly known as: XARELTO Take 1 tablet (10 mg total) by mouth every evening.   sucralfate 1 GM/10ML suspension Commonly known as: CARAFATE Take 10 mLs (1 g total) by mouth 4 (four) times daily -  with meals and at bedtime.   traZODone 100 MG tablet Commonly known as: DESYREL Take 100 mg by mouth at bedtime.       Follow-up Information  Marcine Matar, MD Follow up on 01/16/2021.   Specialty: Internal Medicine Why: You are scheduled to see Dr. Laural Benes on Thursday, May 26th at 930am. Contact information: [address] [place] [address] [phone]              Allergies  Allergen Reactions  . Wheat Extract Swelling    Consultations:  GI  General surgery   Procedures/Studies: CT ABDOMEN PELVIS WO CONTRAST  Result Date: 11/22/2020 CLINICAL DATA:  Right lower  quadrant pain EXAM: CT ABDOMEN AND PELVIS WITHOUT CONTRAST TECHNIQUE: Multidetector CT imaging of the abdomen and pelvis was performed following the standard protocol without IV contrast. COMPARISON:  Ultrasound 01/15/2021, CT 11/14/2020, 11/10/2020, 04/20/2020 FINDINGS: Lower chest: Lung bases demonstrate no acute consolidation or effusion. Linear atelectasis or scarring at the lingula. Mild cardiomegaly. Small hiatal hernia. Hepatobiliary: Stable subcentimeter hypodensity in the liver, too small to further characterize. Multiple calcified gallstones. No biliary dilatation. Pancreas: No definite acute inflammatory change. Calcifications at the head of pancreas. Cystic lesion measuring 4.1 by 2.9 cm, previously 4.2 cm. Spleen: Normal in size without focal abnormality. Adrenals/Urinary Tract: Adrenal glands within normal limits. Cysts within the kidneys. Rim calcified cyst exophytic upper pole right kidney without change. No hydronephrosis. The bladder is normal Stomach/Bowel: Stomach is within normal limits. Appendix appears normal. No evidence of bowel wall thickening, distention, or inflammatory changes. Vascular/Lymphatic: Nonaneurysmal aorta. Mild aortic atherosclerosis. No suspicious nodes Reproductive: Multiple uterine masses consistent with fibroids. Other: Negative for free air or free fluid Musculoskeletal: Grade 1 anterolisthesis L4 on L5. Degenerative changes of the lumbar spine. No acute osseous abnormality IMPRESSION: 1. Negative for acute appendicitis. 2. Gallstones. No biliary dilatation. 3. Stable 4.1 cm cystic lesion and calcification within the head of the pancreas. 4. Fibroid uterus. Aortic Atherosclerosis (ICD10-I70.0). Electronically Signed   By: Jasmine Pang M.D.   On: 11/22/2020 23:17   CT Angio Chest PE W and/or Wo Contrast  Result Date: 11/10/2020 CLINICAL DATA:  65 year old female with right rib and flank pain. History of PE in September 2021. Abdominal pain with black tarry stools.  History of cholelithiasis. History of exploratory laparotomy, perforated gastric ulcer in August 2021. EXAM: CT ANGIOGRAPHY CHEST CT ABDOMEN AND PELVIS WITH CONTRAST TECHNIQUE: Multidetector CT imaging of the chest was performed using the standard protocol during bolus administration of intravenous contrast. Multiplanar CT image reconstructions and MIPs were obtained to evaluate the vascular anatomy. Multidetector CT imaging of the abdomen and pelvis was performed using the standard protocol during bolus administration of intravenous contrast. CONTRAST:  OMNIPAQUE IOHEXOL 350 MG/ML SOLN COMPARISON:  CT Abdomen and Pelvis 10/15/2020 and earlier. CTA chest 05/01/2020. FINDINGS: CTA CHEST FINDINGS Cardiovascular: Good contrast bolus timing in the pulmonary arterial tree. No focal filling defect identified in the pulmonary arteries today to suggest acute or residual pulmonary embolism. Minimal calcified coronary artery atherosclerosis is evident. Mild cardiomegaly. No pericardial effusion. Minimal plaque of the thoracic aorta. Mediastinum/Nodes: Negative.  No lymphadenopathy. Lungs/Pleura: Somewhat lower lung volumes compared to last year. Resolved pleural effusions and lower lobe collapse or consolidation. Mild mosaic attenuation and mild peribronchial streaky opacity in the lingula, favor a combination of atelectasis and mild gas trapping. Major airways are patent. No areas suspicious for acute respiratory infection. Musculoskeletal: Chronic severe T10-T11 disc and endplate degeneration. Chronic severe degeneration at the visible shoulders. No acute osseous abnormality identified. Review of the MIP images confirms the above findings. CT ABDOMEN and PELVIS FINDINGS Hepatobiliary: Chronic gallstones individually up to 16  mm. Continued indistinct appearance of the porta hepatis which appears mildly inflamed. But at the gallbladder fundus no wall thickening or pericholecystic inflammation is evident. No  hyperenhancement at the gallbladder fossa. Stable liver parenchyma including tiny benign appearing low-density area at the dome. No bile duct enlargement. Pancreas: Abnormal partially calcified multi cystic appearing pancreatic head mass as described on the CT last month appears stable measuring 4.3 cm on series 4, image 37. No main pancreatic ductal dilatation. No superimposed acute pancreatitis suspected. However, along with the indistinct porta hepatis the gastric antrum and proximal duodenum continue to appear indistinct and inflamed. See coronal image 35 today. Compared to last month, the distal stomach and proximal duodenum appear mildly improved. No extraluminal gas or fluid identified. Proximal stomach and distal duodenum appear within normal limits. Spleen: Negative. Adrenals/Urinary Tract: Adrenal glands remain normal. Stable kidneys with bilateral parapelvic and exophytic renal cystic lesions which appeared benign by MRI in August of last year. Symmetric renal contrast excretion with no hydronephrosis or hydroureter. Diminutive and unremarkable urinary bladder. Stable pelvic phleboliths. Stomach/Bowel: Sigmoid diverticulosis again noted. No active inflammation identified. Otherwise redundant large bowel. Retained stool in the right colon and at the hepatic flexure. Normal appendix visible on series 4, image 60. Decompressed and negative terminal ileum. No dilated small bowel. Stomach and duodenum described above. No free air. No free fluid. Vascular/Lymphatic: Major arterial structures remain patent with tortuosity but minimal atherosclerosis. Portal venous system is patent. No lymphadenopathy. Reproductive: Stable lobulated fibroid uterus. Other: No pelvic free fluid. Musculoskeletal: Stable advanced lumbar disc and facet degeneration. No acute osseous abnormality identified. Review of the MIP images confirms the above findings. IMPRESSION: CHEST: 1. Negative for acute or residual pulmonary embolus. 2.  Mild scattered pulmonary atelectasis and gas trapping. No other acute process in the chest. ABDOMEN AND PELVIS: 1. Largely stable CT appearance of the abdomen and pelvis since 10/15/2020 (please see also that report): 2. Stable cystic and partially calcified pancreatic head mass, 4.3 cm. 3. Slightly improved appearance of the gastric antrum, duodenal bulb, and porta hepatis from last month. As before this may reflect improving peptic ulcer disease. Underlying chronic cholelithiasis, without strong evidence of acute cholecystitis today. 4. Fibroid uterus. Multiple renal cysts which had a benign MRI appearance last yea. Sigmoid diverticulosis. Electronically Signed   By: Odessa Fleming M.D.   On: 11/10/2020 06:10   CT ABDOMEN PELVIS W CONTRAST  Result Date: 11/10/2020 CLINICAL DATA:  65 year old female with right rib and flank pain. History of PE in September 2021. Abdominal pain with black tarry stools. History of cholelithiasis. History of exploratory laparotomy, perforated gastric ulcer in August 2021. EXAM: CT ANGIOGRAPHY CHEST CT ABDOMEN AND PELVIS WITH CONTRAST TECHNIQUE: Multidetector CT imaging of the chest was performed using the standard protocol during bolus administration of intravenous contrast. Multiplanar CT image reconstructions and MIPs were obtained to evaluate the vascular anatomy. Multidetector CT imaging of the abdomen and pelvis was performed using the standard protocol during bolus administration of intravenous contrast. CONTRAST:  OMNIPAQUE IOHEXOL 350 MG/ML SOLN COMPARISON:  CT Abdomen and Pelvis 10/15/2020 and earlier. CTA chest 05/01/2020. FINDINGS: CTA CHEST FINDINGS Cardiovascular: Good contrast bolus timing in the pulmonary arterial tree. No focal filling defect identified in the pulmonary arteries today to suggest acute or residual pulmonary embolism. Minimal calcified coronary artery atherosclerosis is evident. Mild cardiomegaly. No pericardial effusion. Minimal plaque of the  thoracic aorta. Mediastinum/Nodes: Negative.  No lymphadenopathy. Lungs/Pleura: Somewhat lower lung volumes compared to last year. Resolved  pleural effusions and lower lobe collapse or consolidation. Mild mosaic attenuation and mild peribronchial streaky opacity in the lingula, favor a combination of atelectasis and mild gas trapping. Major airways are patent. No areas suspicious for acute respiratory infection. Musculoskeletal: Chronic severe T10-T11 disc and endplate degeneration. Chronic severe degeneration at the visible shoulders. No acute osseous abnormality identified. Review of the MIP images confirms the above findings. CT ABDOMEN and PELVIS FINDINGS Hepatobiliary: Chronic gallstones individually up to 16 mm. Continued indistinct appearance of the porta hepatis which appears mildly inflamed. But at the gallbladder fundus no wall thickening or pericholecystic inflammation is evident. No hyperenhancement at the gallbladder fossa. Stable liver parenchyma including tiny benign appearing low-density area at the dome. No bile duct enlargement. Pancreas: Abnormal partially calcified multi cystic appearing pancreatic head mass as described on the CT last month appears stable measuring 4.3 cm on series 4, image 37. No main pancreatic ductal dilatation. No superimposed acute pancreatitis suspected. However, along with the indistinct porta hepatis the gastric antrum and proximal duodenum continue to appear indistinct and inflamed. See coronal image 35 today. Compared to last month, the distal stomach and proximal duodenum appear mildly improved. No extraluminal gas or fluid identified. Proximal stomach and distal duodenum appear within normal limits. Spleen: Negative. Adrenals/Urinary Tract: Adrenal glands remain normal. Stable kidneys with bilateral parapelvic and exophytic renal cystic lesions which appeared benign by MRI in August of last year. Symmetric renal contrast excretion with no hydronephrosis or  hydroureter. Diminutive and unremarkable urinary bladder. Stable pelvic phleboliths. Stomach/Bowel: Sigmoid diverticulosis again noted. No active inflammation identified. Otherwise redundant large bowel. Retained stool in the right colon and at the hepatic flexure. Normal appendix visible on series 4, image 60. Decompressed and negative terminal ileum. No dilated small bowel. Stomach and duodenum described above. No free air. No free fluid. Vascular/Lymphatic: Major arterial structures remain patent with tortuosity but minimal atherosclerosis. Portal venous system is patent. No lymphadenopathy. Reproductive: Stable lobulated fibroid uterus. Other: No pelvic free fluid. Musculoskeletal: Stable advanced lumbar disc and facet degeneration. No acute osseous abnormality identified. Review of the MIP images confirms the above findings. IMPRESSION: CHEST: 1. Negative for acute or residual pulmonary embolus. 2. Mild scattered pulmonary atelectasis and gas trapping. No other acute process in the chest. ABDOMEN AND PELVIS: 1. Largely stable CT appearance of the abdomen and pelvis since 10/15/2020 (please see also that report): 2. Stable cystic and partially calcified pancreatic head mass, 4.3 cm. 3. Slightly improved appearance of the gastric antrum, duodenal bulb, and porta hepatis from last month. As before this may reflect improving peptic ulcer disease. Underlying chronic cholelithiasis, without strong evidence of acute cholecystitis today. 4. Fibroid uterus. Multiple renal cysts which had a benign MRI appearance last yea. Sigmoid diverticulosis. Electronically Signed   By: Odessa Fleming M.D.   On: 11/10/2020 06:10   US Abdomen Limited  Result Date: 11/15/2020 CLINICAL DATA:  Right upper quadrant abdominal pain. EXAM: ULTRASOUND ABDOMEN LIMITED RIGHT UPPER QUADRANT COMPARISON:  November 14, 2020.  October 18, 2020. FINDINGS: Gallbladder: Cholelithiasis is noted as well as some degree of sludge within the gallbladder lumen. No  gallbladder wall thickening or pericholecystic fluid is noted. The absence or presence of sonographic Murphy's sign was not reported by the technologist. Common bile duct: Diameter: 2 mm which is within normal limits. Liver: No focal lesion identified. Within normal limits in parenchymal echogenicity. Portal vein is patent on color Doppler imaging with normal direction of blood flow towards the liver. Other: None. IMPRESSION:  Cholelithiasis and gallbladder sludge is noted without gallbladder wall thickening or pericholecystic fluid. Electronically Signed   By: Lupita Raider M.D.   On: 11/15/2020 09:00   DG ABD ACUTE 2+V W 1V CHEST  Result Date: 11/11/2020 CLINICAL DATA:  65 year old female with persistent abdominal pain and vomiting. EXAM: DG ABDOMEN ACUTE WITH 1 VIEW CHEST COMPARISON:  CTA chest and CT Abdomen and Pelvis 11/10/2020, and earlier. FINDINGS: Portable AP upright view of the chest at 0724 hours. Lower lung volumes. No acute pulmonary opacity. Stable cardiac size and mediastinal contours. No pneumoperitoneum. Portable upright and supine views of the abdomen 0726 hours. Non obstructed bowel gas pattern. Cholelithiasis again noted. Pelvic vascular calcifications. No acute osseous abnormality identified. IMPRESSION: 1. Normal bowel gas pattern, no free air. Cholelithiasis again noted. 2. Lower lung volumes with no acute cardiopulmonary abnormality. Electronically Signed   By: Odessa Fleming M.D.   On: 11/11/2020 07:54   DG Abdomen Acute W/Chest  Result Date: 11/01/2020 CLINICAL DATA:  Abdominal pain with black, tarry stools. EXAM: DG ABDOMEN ACUTE WITH 1 VIEW CHEST COMPARISON:  October 18, 2020 FINDINGS: There is no evidence of dilated bowel loops or free intraperitoneal air. No radiopaque calculi are seen. A stable 1.1 cm soft tissue calcification is seen overlying the medial aspect of the mid to upper right abdomen. Heart size and mediastinal contours are within normal limits. Chronic appearing  increased lung markings are seen without evidence of acute infiltrate. IMPRESSION: 1. No evidence of bowel obstruction or acute cardiopulmonary disease. 2. Stable soft tissue calcification within the abdomen which corresponds to the partially calcified pancreatic mass seen on prior studies. Electronically Signed   By: Aram Candela M.D.   On: 11/01/2020 23:55   MM DIAG BREAST TOMO BILATERAL  Result Date: 11/12/2020 CLINICAL DATA:  65 year old female presenting for annual bilateral mammogram and 1 year follow-up of probably benign right breast calcifications. EXAM: DIGITAL DIAGNOSTIC BILATERAL MAMMOGRAM WITH TOMOSYNTHESIS AND CAD TECHNIQUE: Bilateral digital diagnostic mammography and breast tomosynthesis was performed. The images were evaluated with computer-aided detection. COMPARISON:  Previous exam(s). ACR Breast Density Category b: There are scattered areas of fibroglandular density. FINDINGS: A 9 mm group of coarse calcifications in the upper-outer right breast are mammographically stable. No new or suspicious mammographic findings are identified in the remainder of either breast. IMPRESSION: 1. Stable, probably benign right breast calcifications. Recommendation is for a final mammographic follow-up in 1 year. 2. No mammographic evidence of malignancy on the left. RECOMMENDATION: Bilateral diagnostic mammogram in 1 year. I have discussed the findings and recommendations with the patient. If applicable, a reminder letter will be sent to the patient regarding the next appointment. BI-RADS CATEGORY  3: Probably benign. Electronically Signed   By: Sande Brothers M.D.   On: 11/12/2020 14:23   CT Renal Stone Study  Result Date: 11/14/2020 CLINICAL DATA:  Right flank pain EXAM: CT ABDOMEN AND PELVIS WITHOUT CONTRAST TECHNIQUE: Multidetector CT imaging of the abdomen and pelvis was performed following the standard protocol without IV contrast. COMPARISON:  11/10/2020 FINDINGS: Lower chest: Scarring in the  lingula. Small hiatal hernia. No effusions. Hepatobiliary: Multiple gallstones within the gallbladder measuring up 2 17 mm. Small hypodensity in the right hepatic dome compatible with cyst. Pancreas: Partially calcified cystic mass again seen within the pancreatic head measures 4.2 cm, stable. No pancreatic ductal dilatation. Spleen: No focal abnormality.  Normal size. Adrenals/Urinary Tract: Multiple cystic lesions within the kidneys bilaterally. No hydronephrosis. Adrenal glands and urinary bladder unremarkable. Stomach/Bowel:  Large stool burden throughout the colon. Normal appendix. Stomach is decompressed. Mildly prominent fluid-filled right abdominal small bowel loops. Terminal ileum is decompressed. Vascular/Lymphatic: No evidence of aneurysm or adenopathy. Reproductive: Fibroid uterus.  No adnexal mass. Other: No free fluid or free air. Musculoskeletal: No acute bony abnormality. IMPRESSION: Cholelithiasis. Stable partially calcified cystic mass within the pancreatic head. Stable bilateral renal cystic lesions. No renal or ureteral stones. No hydronephrosis. Large stool burden throughout the colon. Fibroid uterus. Electronically Signed   By: Charlett Nose M.D.   On: 11/14/2020 23:55         Discharge Exam: Vitals:   11/27/20 2102 11/28/20 0303  BP: (!) 147/70 133/64  Pulse: (!) 54 (!) 53  Resp: 20 20  Temp: (!) 97.3 F (36.3 C) 97.9 F (36.6 C)  SpO2: 98% 95%   Vitals:   11/27/20 0500 11/27/20 0817 11/27/20 2102 11/28/20 0303  BP: 139/76 (!) 156/80 (!) 147/70 133/64  Pulse: (!) 55 (!) 50 (!) 54 (!) 53  Resp: 16 17 20 20   Temp: 98.1 F (36.7 C) 98.2 F (36.8 C) (!) 97.3 F (36.3 C) 97.9 F (36.6 C)  TempSrc: Oral Oral    SpO2: 95% 99% 98% 95%  Weight:      Height:        General: Pt is alert, awake, not in acute distress Cardiovascular: RRR, S1/S2 +, no rubs, no gallops Respiratory: CTA bilaterally, no wheezing, no rhonchi Abdominal: Soft, RUQ pain, ND, bowel sounds  + Extremities: no edema, no cyanosis   The results of significant diagnostics from this hospitalization (including imaging, microbiology, ancillary and laboratory) are listed below for reference.    Significant Diagnostic Studies: CT ABDOMEN PELVIS WO CONTRAST  Result Date: 11/22/2020 CLINICAL DATA:  Right lower quadrant pain EXAM: CT ABDOMEN AND PELVIS WITHOUT CONTRAST TECHNIQUE: Multidetector CT imaging of the abdomen and pelvis was performed following the standard protocol without IV contrast. COMPARISON:  Ultrasound 01/15/2021, CT 11/14/2020, 11/10/2020, 04/20/2020 FINDINGS: Lower chest: Lung bases demonstrate no acute consolidation or effusion. Linear atelectasis or scarring at the lingula. Mild cardiomegaly. Small hiatal hernia. Hepatobiliary: Stable subcentimeter hypodensity in the liver, too small to further characterize. Multiple calcified gallstones. No biliary dilatation. Pancreas: No definite acute inflammatory change. Calcifications at the head of pancreas. Cystic lesion measuring 4.1 by 2.9 cm, previously 4.2 cm. Spleen: Normal in size without focal abnormality. Adrenals/Urinary Tract: Adrenal glands within normal limits. Cysts within the kidneys. Rim calcified cyst exophytic upper pole right kidney without change. No hydronephrosis. The bladder is normal Stomach/Bowel: Stomach is within normal limits. Appendix appears normal. No evidence of bowel wall thickening, distention, or inflammatory changes. Vascular/Lymphatic: Nonaneurysmal aorta. Mild aortic atherosclerosis. No suspicious nodes Reproductive: Multiple uterine masses consistent with fibroids. Other: Negative for free air or free fluid Musculoskeletal: Grade 1 anterolisthesis L4 on L5. Degenerative changes of the lumbar spine. No acute osseous abnormality IMPRESSION: 1. Negative for acute appendicitis. 2. Gallstones. No biliary dilatation. 3. Stable 4.1 cm cystic lesion and calcification within the head of the pancreas. 4. Fibroid  uterus. Aortic Atherosclerosis (ICD10-I70.0). Electronically Signed   By: Jasmine Pang M.D.   On: 11/22/2020 23:17   CT Angio Chest PE W and/or Wo Contrast  Result Date: 11/10/2020 CLINICAL DATA:  65 year old female with right rib and flank pain. History of PE in September 2021. Abdominal pain with black tarry stools. History of cholelithiasis. History of exploratory laparotomy, perforated gastric ulcer in August 2021. EXAM: CT ANGIOGRAPHY CHEST CT ABDOMEN AND PELVIS WITH  CONTRAST TECHNIQUE: Multidetector CT imaging of the chest was performed using the standard protocol during bolus administration of intravenous contrast. Multiplanar CT image reconstructions and MIPs were obtained to evaluate the vascular anatomy. Multidetector CT imaging of the abdomen and pelvis was performed using the standard protocol during bolus administration of intravenous contrast. CONTRAST:  OMNIPAQUE IOHEXOL 350 MG/ML SOLN COMPARISON:  CT Abdomen and Pelvis 10/15/2020 and earlier. CTA chest 05/01/2020. FINDINGS: CTA CHEST FINDINGS Cardiovascular: Good contrast bolus timing in the pulmonary arterial tree. No focal filling defect identified in the pulmonary arteries today to suggest acute or residual pulmonary embolism. Minimal calcified coronary artery atherosclerosis is evident. Mild cardiomegaly. No pericardial effusion. Minimal plaque of the thoracic aorta. Mediastinum/Nodes: Negative.  No lymphadenopathy. Lungs/Pleura: Somewhat lower lung volumes compared to last year. Resolved pleural effusions and lower lobe collapse or consolidation. Mild mosaic attenuation and mild peribronchial streaky opacity in the lingula, favor a combination of atelectasis and mild gas trapping. Major airways are patent. No areas suspicious for acute respiratory infection. Musculoskeletal: Chronic severe T10-T11 disc and endplate degeneration. Chronic severe degeneration at the visible shoulders. No acute osseous abnormality identified. Review of  the MIP images confirms the above findings. CT ABDOMEN and PELVIS FINDINGS Hepatobiliary: Chronic gallstones individually up to 16 mm. Continued indistinct appearance of the porta hepatis which appears mildly inflamed. But at the gallbladder fundus no wall thickening or pericholecystic inflammation is evident. No hyperenhancement at the gallbladder fossa. Stable liver parenchyma including tiny benign appearing low-density area at the dome. No bile duct enlargement. Pancreas: Abnormal partially calcified multi cystic appearing pancreatic head mass as described on the CT last month appears stable measuring 4.3 cm on series 4, image 37. No main pancreatic ductal dilatation. No superimposed acute pancreatitis suspected. However, along with the indistinct porta hepatis the gastric antrum and proximal duodenum continue to appear indistinct and inflamed. See coronal image 35 today. Compared to last month, the distal stomach and proximal duodenum appear mildly improved. No extraluminal gas or fluid identified. Proximal stomach and distal duodenum appear within normal limits. Spleen: Negative. Adrenals/Urinary Tract: Adrenal glands remain normal. Stable kidneys with bilateral parapelvic and exophytic renal cystic lesions which appeared benign by MRI in August of last year. Symmetric renal contrast excretion with no hydronephrosis or hydroureter. Diminutive and unremarkable urinary bladder. Stable pelvic phleboliths. Stomach/Bowel: Sigmoid diverticulosis again noted. No active inflammation identified. Otherwise redundant large bowel. Retained stool in the right colon and at the hepatic flexure. Normal appendix visible on series 4, image 60. Decompressed and negative terminal ileum. No dilated small bowel. Stomach and duodenum described above. No free air. No free fluid. Vascular/Lymphatic: Major arterial structures remain patent with tortuosity but minimal atherosclerosis. Portal venous system is patent. No lymphadenopathy.  Reproductive: Stable lobulated fibroid uterus. Other: No pelvic free fluid. Musculoskeletal: Stable advanced lumbar disc and facet degeneration. No acute osseous abnormality identified. Review of the MIP images confirms the above findings. IMPRESSION: CHEST: 1. Negative for acute or residual pulmonary embolus. 2. Mild scattered pulmonary atelectasis and gas trapping. No other acute process in the chest. ABDOMEN AND PELVIS: 1. Largely stable CT appearance of the abdomen and pelvis since 10/15/2020 (please see also that report): 2. Stable cystic and partially calcified pancreatic head mass, 4.3 cm. 3. Slightly improved appearance of the gastric antrum, duodenal bulb, and porta hepatis from last month. As before this may reflect improving peptic ulcer disease. Underlying chronic cholelithiasis, without strong evidence of acute cholecystitis today. 4. Fibroid uterus. Multiple renal cysts which  had a benign MRI appearance last yea. Sigmoid diverticulosis. Electronically Signed   By: Odessa Fleming M.D.   On: 11/10/2020 06:10   CT ABDOMEN PELVIS W CONTRAST  Result Date: 11/10/2020 CLINICAL DATA:  65 year old female with right rib and flank pain. History of PE in September 2021. Abdominal pain with black tarry stools. History of cholelithiasis. History of exploratory laparotomy, perforated gastric ulcer in August 2021. EXAM: CT ANGIOGRAPHY CHEST CT ABDOMEN AND PELVIS WITH CONTRAST TECHNIQUE: Multidetector CT imaging of the chest was performed using the standard protocol during bolus administration of intravenous contrast. Multiplanar CT image reconstructions and MIPs were obtained to evaluate the vascular anatomy. Multidetector CT imaging of the abdomen and pelvis was performed using the standard protocol during bolus administration of intravenous contrast. CONTRAST:  OMNIPAQUE IOHEXOL 350 MG/ML SOLN COMPARISON:  CT Abdomen and Pelvis 10/15/2020 and earlier. CTA chest 05/01/2020. FINDINGS: CTA CHEST FINDINGS  Cardiovascular: Good contrast bolus timing in the pulmonary arterial tree. No focal filling defect identified in the pulmonary arteries today to suggest acute or residual pulmonary embolism. Minimal calcified coronary artery atherosclerosis is evident. Mild cardiomegaly. No pericardial effusion. Minimal plaque of the thoracic aorta. Mediastinum/Nodes: Negative.  No lymphadenopathy. Lungs/Pleura: Somewhat lower lung volumes compared to last year. Resolved pleural effusions and lower lobe collapse or consolidation. Mild mosaic attenuation and mild peribronchial streaky opacity in the lingula, favor a combination of atelectasis and mild gas trapping. Major airways are patent. No areas suspicious for acute respiratory infection. Musculoskeletal: Chronic severe T10-T11 disc and endplate degeneration. Chronic severe degeneration at the visible shoulders. No acute osseous abnormality identified. Review of the MIP images confirms the above findings. CT ABDOMEN and PELVIS FINDINGS Hepatobiliary: Chronic gallstones individually up to 16 mm. Continued indistinct appearance of the porta hepatis which appears mildly inflamed. But at the gallbladder fundus no wall thickening or pericholecystic inflammation is evident. No hyperenhancement at the gallbladder fossa. Stable liver parenchyma including tiny benign appearing low-density area at the dome. No bile duct enlargement. Pancreas: Abnormal partially calcified multi cystic appearing pancreatic head mass as described on the CT last month appears stable measuring 4.3 cm on series 4, image 37. No main pancreatic ductal dilatation. No superimposed acute pancreatitis suspected. However, along with the indistinct porta hepatis the gastric antrum and proximal duodenum continue to appear indistinct and inflamed. See coronal image 35 today. Compared to last month, the distal stomach and proximal duodenum appear mildly improved. No extraluminal gas or fluid identified. Proximal stomach  and distal duodenum appear within normal limits. Spleen: Negative. Adrenals/Urinary Tract: Adrenal glands remain normal. Stable kidneys with bilateral parapelvic and exophytic renal cystic lesions which appeared benign by MRI in August of last year. Symmetric renal contrast excretion with no hydronephrosis or hydroureter. Diminutive and unremarkable urinary bladder. Stable pelvic phleboliths. Stomach/Bowel: Sigmoid diverticulosis again noted. No active inflammation identified. Otherwise redundant large bowel. Retained stool in the right colon and at the hepatic flexure. Normal appendix visible on series 4, image 60. Decompressed and negative terminal ileum. No dilated small bowel. Stomach and duodenum described above. No free air. No free fluid. Vascular/Lymphatic: Major arterial structures remain patent with tortuosity but minimal atherosclerosis. Portal venous system is patent. No lymphadenopathy. Reproductive: Stable lobulated fibroid uterus. Other: No pelvic free fluid. Musculoskeletal: Stable advanced lumbar disc and facet degeneration. No acute osseous abnormality identified. Review of the MIP images confirms the above findings. IMPRESSION: CHEST: 1. Negative for acute or residual pulmonary embolus. 2. Mild scattered pulmonary atelectasis and gas trapping. No  other acute process in the chest. ABDOMEN AND PELVIS: 1. Largely stable CT appearance of the abdomen and pelvis since 10/15/2020 (please see also that report): 2. Stable cystic and partially calcified pancreatic head mass, 4.3 cm. 3. Slightly improved appearance of the gastric antrum, duodenal bulb, and porta hepatis from last month. As before this may reflect improving peptic ulcer disease. Underlying chronic cholelithiasis, without strong evidence of acute cholecystitis today. 4. Fibroid uterus. Multiple renal cysts which had a benign MRI appearance last yea. Sigmoid diverticulosis. Electronically Signed   By: Odessa Fleming M.D.   On: 11/10/2020 06:10   US  Abdomen Limited  Result Date: 11/15/2020 CLINICAL DATA:  Right upper quadrant abdominal pain. EXAM: ULTRASOUND ABDOMEN LIMITED RIGHT UPPER QUADRANT COMPARISON:  November 14, 2020.  October 18, 2020. FINDINGS: Gallbladder: Cholelithiasis is noted as well as some degree of sludge within the gallbladder lumen. No gallbladder wall thickening or pericholecystic fluid is noted. The absence or presence of sonographic Murphy's sign was not reported by the technologist. Common bile duct: Diameter: 2 mm which is within normal limits. Liver: No focal lesion identified. Within normal limits in parenchymal echogenicity. Portal vein is patent on color Doppler imaging with normal direction of blood flow towards the liver. Other: None. IMPRESSION: Cholelithiasis and gallbladder sludge is noted without gallbladder wall thickening or pericholecystic fluid. Electronically Signed   By: Lupita Raider M.D.   On: 11/15/2020 09:00   DG ABD ACUTE 2+V W 1V CHEST  Result Date: 11/11/2020 CLINICAL DATA:  64 year old female with persistent abdominal pain and vomiting. EXAM: DG ABDOMEN ACUTE WITH 1 VIEW CHEST COMPARISON:  CTA chest and CT Abdomen and Pelvis 11/10/2020, and earlier. FINDINGS: Portable AP upright view of the chest at 0724 hours. Lower lung volumes. No acute pulmonary opacity. Stable cardiac size and mediastinal contours. No pneumoperitoneum. Portable upright and supine views of the abdomen 0726 hours. Non obstructed bowel gas pattern. Cholelithiasis again noted. Pelvic vascular calcifications. No acute osseous abnormality identified. IMPRESSION: 1. Normal bowel gas pattern, no free air. Cholelithiasis again noted. 2. Lower lung volumes with no acute cardiopulmonary abnormality. Electronically Signed   By: Odessa Fleming M.D.   On: 11/11/2020 07:54   DG Abdomen Acute W/Chest  Result Date: 11/01/2020 CLINICAL DATA:  Abdominal pain with black, tarry stools. EXAM: DG ABDOMEN ACUTE WITH 1 VIEW CHEST COMPARISON:  October 18, 2020  FINDINGS: There is no evidence of dilated bowel loops or free intraperitoneal air. No radiopaque calculi are seen. A stable 1.1 cm soft tissue calcification is seen overlying the medial aspect of the mid to upper right abdomen. Heart size and mediastinal contours are within normal limits. Chronic appearing increased lung markings are seen without evidence of acute infiltrate. IMPRESSION: 1. No evidence of bowel obstruction or acute cardiopulmonary disease. 2. Stable soft tissue calcification within the abdomen which corresponds to the partially calcified pancreatic mass seen on prior studies. Electronically Signed   By: Aram Candela M.D.   On: 11/01/2020 23:55   MM DIAG BREAST TOMO BILATERAL  Result Date: 11/12/2020 CLINICAL DATA:  65 year old female presenting for annual bilateral mammogram and 1 year follow-up of probably benign right breast calcifications. EXAM: DIGITAL DIAGNOSTIC BILATERAL MAMMOGRAM WITH TOMOSYNTHESIS AND CAD TECHNIQUE: Bilateral digital diagnostic mammography and breast tomosynthesis was performed. The images were evaluated with computer-aided detection. COMPARISON:  Previous exam(s). ACR Breast Density Category b: There are scattered areas of fibroglandular density. FINDINGS: A 9 mm group of coarse calcifications in the upper-outer right breast are  mammographically stable. No new or suspicious mammographic findings are identified in the remainder of either breast. IMPRESSION: 1. Stable, probably benign right breast calcifications. Recommendation is for a final mammographic follow-up in 1 year. 2. No mammographic evidence of malignancy on the left. RECOMMENDATION: Bilateral diagnostic mammogram in 1 year. I have discussed the findings and recommendations with the patient. If applicable, a reminder letter will be sent to the patient regarding the next appointment. BI-RADS CATEGORY  3: Probably benign. Electronically Signed   By: Sande Brothers M.D.   On: 11/12/2020 14:23   CT Renal  Stone Study  Result Date: 11/14/2020 CLINICAL DATA:  Right flank pain EXAM: CT ABDOMEN AND PELVIS WITHOUT CONTRAST TECHNIQUE: Multidetector CT imaging of the abdomen and pelvis was performed following the standard protocol without IV contrast. COMPARISON:  11/10/2020 FINDINGS: Lower chest: Scarring in the lingula. Small hiatal hernia. No effusions. Hepatobiliary: Multiple gallstones within the gallbladder measuring up 2 17 mm. Small hypodensity in the right hepatic dome compatible with cyst. Pancreas: Partially calcified cystic mass again seen within the pancreatic head measures 4.2 cm, stable. No pancreatic ductal dilatation. Spleen: No focal abnormality.  Normal size. Adrenals/Urinary Tract: Multiple cystic lesions within the kidneys bilaterally. No hydronephrosis. Adrenal glands and urinary bladder unremarkable. Stomach/Bowel: Large stool burden throughout the colon. Normal appendix. Stomach is decompressed. Mildly prominent fluid-filled right abdominal small bowel loops. Terminal ileum is decompressed. Vascular/Lymphatic: No evidence of aneurysm or adenopathy. Reproductive: Fibroid uterus.  No adnexal mass. Other: No free fluid or free air. Musculoskeletal: No acute bony abnormality. IMPRESSION: Cholelithiasis. Stable partially calcified cystic mass within the pancreatic head. Stable bilateral renal cystic lesions. No renal or ureteral stones. No hydronephrosis. Large stool burden throughout the colon. Fibroid uterus. Electronically Signed   By: Charlett Nose M.D.   On: 11/14/2020 23:55     Microbiology: Recent Results (from the past 240 hour(s))  SARS CORONAVIRUS 2 (Majd Tissue 6-24 HRS) Nasopharyngeal Nasopharyngeal Swab     Status: None   Collection Time: 11/23/20 12:06 AM   Specimen: Nasopharyngeal Swab  Result Value Ref Range Status   SARS Coronavirus 2 NEGATIVE NEGATIVE Final    Comment: (NOTE) SARS-CoV-2 target nucleic acids are NOT DETECTED.  The SARS-CoV-2 RNA is generally detectable in upper and  lower respiratory specimens during the acute phase of infection. Negative results do not preclude SARS-CoV-2 infection, do not rule out co-infections with other pathogens, and should not be used as the sole basis for treatment or other patient management decisions. Negative results must be combined with clinical observations, patient history, and epidemiological information. The expected result is Negative.  Fact Sheet for Patients: HairSlick.no  Fact Sheet for Healthcare Providers: quierodirigir.com  This test is not yet approved or cleared by the [place] FDA and  has been authorized for detection and/or diagnosis of SARS-CoV-2 by FDA under an Emergency Use Authorization (EUA). This EUA will remain  in effect (meaning this test can be used) for the duration of the COVID-19 declaration under Se ction 564(b)(1) of the Act, 21 U.S.C. section 360bbb-3(b)(1), unless the authorization is terminated or revoked sooner.  Performed at [facility], [address], [place], [place] [address]      Labs: Basic Metabolic Panel: Recent Labs  Lab 11/23/20 [id number] 11/24/20 0837 11/25/20 0508 11/26/20 0432 11/27/20 0420 11/28/20 0446  NA 140 142 144 143 142 142  K 3.3* 3.2* 3.1* 3.3* 3.4* 3.2*  CL 108 112* 113* 112* 110 110  CO2 23 23 22  24  23 25  GLUCOSE 98 82 90 90 103* 98  BUN 10 7* 6* 5* 9 8  CREATININE 0.61 0.65 0.65 0.72 0.83 0.70  CALCIUM 8.2* 8.3* 8.5* 8.3* 8.5* 8.6*  MG 1.8  --   --   --   --  1.7   Liver Function Tests: Recent Labs  Lab 11/22/20 2206 11/23/20 0607 11/24/20 0837  AST 22 13* 15  ALT 8 7 7   ALKPHOS 51 39 39  BILITOT 0.4 0.4 0.6  PROT 7.3 5.5* 5.8*  ALBUMIN 4.0 3.1* 3.1*   Recent Labs  Lab 11/22/20 2206  LIPASE 31   No results for input(s): AMMONIA in the last 168 hours. CBC: Recent Labs  Lab 11/22/20 2206 11/23/20 0607 11/28/20 0446  WBC 8.9 6.0 3.5*  NEUTROABS 6.9  --   --    HGB 9.8* 8.6* 9.4*  HCT 30.8* 26.8* 29.1*  MCV 87.3 87.3 86.4  PLT 214 175 150   Cardiac Enzymes: No results for input(s): CKTOTAL, CKMB, CKMBINDEX, TROPONINI in the last 168 hours. BNP: Invalid input(s): POCBNP CBG: No results for input(s): GLUCAP in the last 168 hours.  Time coordinating discharge:  36 minutes  Signed:  Catarina Hartshorn, DO [facility] Pager: [phone] 11/28/2020, 11:05 AM

## 2020-11-29 ENCOUNTER — Encounter: Payer: Self-pay | Admitting: *Deleted

## 2020-11-29 ENCOUNTER — Encounter: Payer: Self-pay | Admitting: Cardiology

## 2020-11-29 ENCOUNTER — Telehealth (INDEPENDENT_AMBULATORY_CARE_PROVIDER_SITE_OTHER): Payer: Medicaid Other | Admitting: General Surgery

## 2020-11-29 ENCOUNTER — Ambulatory Visit: Payer: Medicaid Other | Admitting: Cardiology

## 2020-11-29 ENCOUNTER — Telehealth: Payer: Self-pay

## 2020-11-29 ENCOUNTER — Other Ambulatory Visit: Payer: Self-pay

## 2020-11-29 VITALS — BP 120/80 | HR 60 | Ht 64.0 in | Wt 184.0 lb

## 2020-11-29 DIAGNOSIS — Z01818 Encounter for other preprocedural examination: Secondary | ICD-10-CM

## 2020-11-29 DIAGNOSIS — R0602 Shortness of breath: Secondary | ICD-10-CM

## 2020-11-29 DIAGNOSIS — I700 Atherosclerosis of aorta: Secondary | ICD-10-CM | POA: Diagnosis not present

## 2020-11-29 DIAGNOSIS — K862 Cyst of pancreas: Secondary | ICD-10-CM

## 2020-11-29 DIAGNOSIS — K869 Disease of pancreas, unspecified: Secondary | ICD-10-CM

## 2020-11-29 NOTE — Telephone Encounter (Signed)
[facility]  Dr. Barry Dienes said she could see patient versus Dr. Zenia Resides at [facility]. Will get her referred to see them. She sees Dr. Arnoldo Morale in follow up 4/12.  Dr. Barry Dienes requested another MRCP. I will order and see if office can get approved/ scheduled.  Curlene Labrum, MD [facility] [address] [place], [place] [address] [phone] (office)

## 2020-11-29 NOTE — Patient Instructions (Signed)
Medication Instructions:  The current medical regimen is effective;  continue present plan and medications.  *If you need a refill on your cardiac medications before your next appointment, please call your pharmacy*  Testing/Procedures: Your physician has requested that you have a lexiscan myoview. For further information please visit [email or web address]. Please follow instruction sheet, as given.  Follow-Up: At [facility], you and your health needs are our priority.  As part of our continuing mission to provide you with exceptional heart care, we have created designated Provider Care Teams.  These Care Teams include your primary Cardiologist (physician) and Advanced Practice Providers (APPs -  Physician Assistants and Nurse Practitioners) who all work together to provide you with the care you need, when you need it.  We recommend signing up for the patient portal called "MyChart".  Sign up information is provided on this After Visit Summary.  MyChart is used to connect with patients for Virtual Visits (Telemedicine).  Patients are able to view lab/test results, encounter notes, upcoming appointments, etc.  Non-urgent messages can be sent to your provider as well.   To learn more about what you can do with MyChart, go to [email or web address].    Your next appointment:   Follow up as needed.  Thank you for choosing [facility]!!

## 2020-11-29 NOTE — Progress Notes (Signed)
Cardiology Office Note:    Date:  11/29/2020   ID:  Maria Murphy, DOB 12/06/1955, MRN [id number]  PCP:  Ladell Pier, MD   [place]  Cardiologist:  No primary care provider on file.  Advanced Practice Provider:  No care team member to display Electrophysiologist:  None       Referring MD: Ladell Pier, MD     History of Present Illness:    Maria Murphy is a 65 y.o. female here for the evaluation of aortic atherosclerosis as well as preoperative risk ratification at the request of Dr. Karle Plumber.  She has had saddle PE on Xarelto pancreatic cyst chronic peptic ulcers that have not healed on most recent EGD status post [place] patch 03/2020.  Dr. Rush Landmark feels like she may need surgery for chronic ulcers.  General surgery evaluated her on 11/24/2020 in the hospital setting Dr. Blake Divine.  No plans for cholecystectomy at this time.  There was talk about may need referral to tertiary care center to further evaluate and determine best plan moving forward as she continues to have these readmissions and issues.  Past Medical History:  Diagnosis Date  . Collagen vascular disease ([place])   . Essential hypertension, benign 01/24/2015  . Foot ulcer ([place])   . GERD (gastroesophageal reflux disease)   . Hypertension   . Memory loss   . PUD (peptic ulcer disease)    with perforation s/p exploratory laparotomy  . Pulmonary embolus ([place]) 04/2020  . Skin cancer   . Vision abnormalities     Past Surgical History:  Procedure Laterality Date  . BIOPSY  11/20/2020   Procedure: BIOPSY;  Surgeon: Rush Landmark Telford Nab., MD;  Location: [facility];  Service: Gastroenterology;;  . CENTRAL LINE INSERTION Right 04/22/2020   Procedure: CENTRAL LINE INSERTION;  Surgeon: Virl Cagey, MD;  Location: [facility];  Service: General;  Laterality: Right;  . DIAGNOSTIC LAPAROSCOPY    . ESOPHAGOGASTRODUODENOSCOPY N/A 11/20/2020   Procedure:  ESOPHAGOGASTRODUODENOSCOPY (EGD);  Surgeon: Irving Copas., MD;  Location: [facility];  Service: Gastroenterology;  Laterality: N/A;  . ESOPHAGOGASTRODUODENOSCOPY (EGD) WITH PROPOFOL N/A 07/10/2020   Procedure: ESOPHAGOGASTRODUODENOSCOPY (EGD) WITH PROPOFOL;  Surgeon: Rogene Houston, MD;  [facility];  Service: Endoscopy;  Laterality: N/A;  . ESOPHAGOGASTRODUODENOSCOPY (EGD) WITH PROPOFOL N/A 10/18/2020   Procedure: ESOPHAGOGASTRODUODENOSCOPY (EGD) WITH PROPOFOL;  Surgeon: Harvel Quale, MD;  [facility];  Service: Gastroenterology;  Laterality: N/A;  . EUS N/A 11/20/2020   Procedure: UPPER ENDOSCOPIC ULTRASOUND (EUS) LINEAR;  Surgeon: Irving Copas., MD;  Location: [facility];  Service: Gastroenterology;  Laterality: N/A;  . GASTRORRHAPHY  04/22/2020   Procedure: GASTRORRHAPHY;  Surgeon: Virl Cagey, MD;  Location: [facility];  Service: General;;  . IR CATHETER TUBE CHANGE  05/09/2020  . LAPAROTOMY N/A 04/22/2020   Procedure: EXPLORATORY LAPAROTOMY;  Surgeon: Virl Cagey, MD;  Location: [facility];  Service: General;  Laterality: N/A;  . TONSILLECTOMY      Current Medications: Current Meds  Medication Sig  . acetaminophen (TYLENOL) 500 MG tablet Take 1 tablet (500 mg total) by mouth every 6 (six) hours as needed for mild pain or fever.  Marland Kitchen amLODipine (NORVASC) 2.5 MG tablet Take 1 tablet (2.5 mg total) by mouth daily. For BP  . citalopram (CELEXA) 20 MG tablet Take 20 mg by mouth daily.  Marland Kitchen dicyclomine (BENTYL) 10 MG capsule Take 10 mg by mouth 2 (two) times daily.  Marland Kitchen  donepezil (ARICEPT) 10 MG tablet Take 0.5-1 tablets (5-10 mg total) by mouth in the morning and at bedtime.  . famotidine (PEPCID) 20 MG tablet Take 1 tablet (20 mg total) by mouth 2 (two) times daily.  . ferrous sulfate 325 (65 FE) MG tablet Take 1 tablet (325 mg total) by mouth daily with breakfast.  . gabapentin (NEURONTIN) 300 MG capsule Take 1 capsule (300 mg  total) by mouth 2 (two) times daily.  Marland Kitchen lactose free nutrition (BOOST) LIQD Take 237 mLs by mouth 2 (two) times daily.  . lipase/protease/amylase (CREON) 36000 UNITS CPEP capsule Take 2 capsules (72,000 Units total) by mouth 3 (three) times daily with meals. May also take 1 capsule (36,000 Units total) as needed (with snacks).  . mirtazapine (REMERON) 15 MG tablet Take 1 tablet (15 mg total) by mouth at bedtime.  . ondansetron (ZOFRAN ODT) 8 MG disintegrating tablet Take 1 tablet (8 mg total) by mouth every 8 (eight) hours as needed for nausea or vomiting.  Marland Kitchen oxyCODONE (ROXICODONE) 5 MG immediate release tablet Take 1 tablet (5 mg total) by mouth every 4 (four) hours as needed for moderate pain or severe pain.  . pantoprazole (PROTONIX) 40 MG tablet Take 1 tablet (40 mg total) by mouth 2 (two) times daily.  . promethazine (PHENERGAN) 25 MG suppository Place 1 suppository (25 mg total) rectally every 6 (six) hours as needed for nausea or vomiting.  . promethazine (PHENERGAN) 25 MG tablet Take 1 tablet (25 mg total) by mouth every 6 (six) hours as needed for nausea or vomiting.  . rivaroxaban (XARELTO) 10 MG TABS tablet Take 1 tablet (10 mg total) by mouth every evening.  . sucralfate (CARAFATE) 1 GM/10ML suspension Take 10 mLs (1 g total) by mouth 4 (four) times daily -  with meals and at bedtime.  . traZODone (DESYREL) 100 MG tablet Take 100 mg by mouth at bedtime.     Allergies:   Wheat extract   Social History   Socioeconomic History  . Marital status: Widowed    Spouse name: Not on file  . Number of children: 2  . Years of education: Not on file  . Highest education level: Not on file  Occupational History  . Not on file  Tobacco Use  . Smoking status: Never Smoker  . Smokeless tobacco: Never Used  Vaping Use  . Vaping Use: Never used  Substance and Sexual Activity  . Alcohol use: Yes    Alcohol/week: 2.0 - 3.0 standard drinks    Types: 2 - 3 Glasses of wine per week  . Drug  use: No  . Sexual activity: Not Currently    Birth control/protection: Post-menopausal  Other Topics Concern  . Not on file  Social History Narrative  . Not on file   Social Determinants of Health   Financial Resource Strain: Not on file  Food Insecurity: Not on file  Transportation Needs: Not on file  Physical Activity: Not on file  Stress: Not on file  Social Connections: Not on file     Family History: The patient's family history includes Alcohol abuse in her father; Arthritis/Rheumatoid in her mother; High Cholesterol in her mother; Hypertension in her maternal grandmother and mother. There is no history of Colon cancer, Colon polyps, or Inflammatory bowel disease.  ROS:   Please see the history of present illness.     All other systems reviewed and are negative.  EKGs/Labs/Other Studies Reviewed:    The following studies were reviewed  today:  ECHO 2021:  1. Left ventricular ejection fraction, by estimation, is 60 to 65%. The  left ventricle has normal function. The left ventricle has no regional  wall motion abnormalities. Left ventricular diastolic parameters were  normal. There is the interventricular  septum is flattened in systole, consistent with right ventricular pressure  overload.  2. Right ventricular systolic function is normal. The right ventricular  size is mildly enlarged. There is moderately elevated pulmonary artery  systolic pressure. The estimated right ventricular systolic pressure is  27.0 mmHg.  3. The mitral valve is normal in structure. Trivial mitral valve  regurgitation. No evidence of mitral stenosis.  4. The aortic valve is tricuspid. Aortic valve regurgitation is not  visualized. No aortic stenosis is present.   Comparison(s): No prior Echocardiogram.   Chest CT on 11/10/2020 showed the following:  Minimal calcified coronary artery atherosclerosis is evident. Mild cardiomegaly. No pericardial effusion. Minimal plaque of  the thoracic aorta.   EKG:  EKG is  ordered today.  The ekg ordered today demonstrates NSR 60  Recent Labs: 10/17/2020: TSH 1.13 11/24/2020: ALT 7 11/28/2020: BUN 8; Creatinine, Ser 0.70; Hemoglobin 9.4; Magnesium 1.7; Platelets 150; Potassium 3.2; Sodium 142  Recent Lipid Panel No results found for: CHOL, TRIG, HDL, CHOLHDL, VLDL, LDLCALC, LDLDIRECT   Risk Assessment/Calculations:      Physical Exam:    VS:  BP 120/80 (BP Location: Left Arm, Patient Position: Sitting, Cuff Size: Normal)   Pulse 60   Ht 5\' 4"  (1.626 m)   Wt 184 lb (83.5 kg)   SpO2 95%   BMI 31.58 kg/m     Wt Readings from Last 3 Encounters:  11/29/20 184 lb (83.5 kg)  11/22/20 175 lb (79.4 kg)  11/20/20 175 lb 0.7 oz (79.4 kg)     GEN:  Well nourished, well developed in no acute distress HEENT: Normal NECK: No JVD; No carotid bruits LYMPHATICS: No lymphadenopathy CARDIAC: RRR, no murmurs, rubs, gallops RESPIRATORY:  Clear to auscultation without rales, wheezing or rhonchi  ABDOMEN: Soft, non-tender, non-distended MUSCULOSKELETAL:  Mild LE edema; No deformity  SKIN: Warm and dry NEUROLOGIC:  Alert and oriented x 3 PSYCHIATRIC:  Normal affect   ASSESSMENT:    1. Pre-op evaluation   2. Shortness of breath   3. Aortic atherosclerosis (HCC)    PLAN:    In order of problems listed above:  Preoperative cardiac risk -I will go ahead and check a pharmacologic stress test to make sure that there is no high risk evidence of ischemia.  She does have some small/mild coronary calcifications noted on CT scan personally reviewed.  She also has aortic atherosclerosis.  She does have some shortness of breath with heavy activity but otherwise no chest pain.  No syncope.  Consider statin therapy given aortic atherosclerosis.  Bilateral pulmonary emboli -This occurred in late August 2021.  She continues with Xarelto.  Multiple GI issues -Multiple admissions etc.  General surgery note reviewed.  They are  considering tertiary care referral if necessary.   Medication Adjustments/Labs and Tests Ordered: Current medicines are reviewed at length with the patient today.  Concerns regarding medicines are outlined above.  Orders Placed This Encounter  Procedures  . MYOCARDIAL PERFUSION IMAGING  . EKG 12-Lead   No orders of the defined types were placed in this encounter.   There are no Patient Instructions on file for this visit.   Signed, Candee Furbish, MD  11/29/2020 2:26 PM    [facility] 

## 2020-11-29 NOTE — Telephone Encounter (Signed)
Transition Care Management Follow-up Telephone Call  Date of discharge and from where:[facility] on 11/28/2020 How have you been since you were released from the hospital? Better  Any questions or concerns? No questions/concerns reported.  Items Reviewed: Did the pt receive and understand the discharge instructions provided? have the instructions and have no questions.  Medications obtained and verified? She said that she have the medication list  and the hospital staff reviewed them in detail prior to discharge. She said that she has all of the medications and  have no questions.  Any new allergies since your discharge? None reported  Do you have support at home? Yes, sister Other (ie: DME, Home Health, etc)   HHC aid 5 days a week /2h/day     Functional Questionnaire: (I = Independent and D = Dependent) ADL's:  Independent.      Follow up appointments reviewed:   PCP Hospital f/u appt confirmed?Yes with Dr Wynetta Emery on 12/09/2020  Specialist Hospital f/u appt confirmed? None scheduled at this time  Are transportation arrangements needed? have transportation   If their condition worsens, is the pt aware to call  their PCP or go to the ED? Yes.Made pt aware if condition worsen or start experiencing rapid weight gain, chest pain, diff breathing, SOB, high fevers, or bleading to refer imediately to ED for further evaluation.  Was the patient provided with contact information for the PCP's office or ED? He has the phone number  Was the pt encouraged to call back with questions or concerns?yes

## 2020-12-02 ENCOUNTER — Encounter (HOSPITAL_COMMUNITY): Payer: Self-pay | Admitting: Physical Therapy

## 2020-12-02 ENCOUNTER — Ambulatory Visit (HOSPITAL_COMMUNITY): Payer: Medicaid Other | Admitting: Physical Therapy

## 2020-12-02 ENCOUNTER — Other Ambulatory Visit: Payer: Self-pay

## 2020-12-02 DIAGNOSIS — M6281 Muscle weakness (generalized): Secondary | ICD-10-CM

## 2020-12-02 DIAGNOSIS — M25562 Pain in left knee: Secondary | ICD-10-CM

## 2020-12-02 DIAGNOSIS — R2689 Other abnormalities of gait and mobility: Secondary | ICD-10-CM

## 2020-12-02 DIAGNOSIS — M25661 Stiffness of right knee, not elsewhere classified: Secondary | ICD-10-CM

## 2020-12-02 DIAGNOSIS — R29898 Other symptoms and signs involving the musculoskeletal system: Secondary | ICD-10-CM

## 2020-12-02 DIAGNOSIS — M25561 Pain in right knee: Secondary | ICD-10-CM

## 2020-12-02 DIAGNOSIS — M25662 Stiffness of left knee, not elsewhere classified: Secondary | ICD-10-CM | POA: Diagnosis present
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
## 2020-12-03 ENCOUNTER — Telehealth: Payer: Self-pay | Admitting: Internal Medicine

## 2020-12-03 ENCOUNTER — Telehealth: Payer: Self-pay | Admitting: *Deleted

## 2020-12-03 ENCOUNTER — Ambulatory Visit (INDEPENDENT_AMBULATORY_CARE_PROVIDER_SITE_OTHER): Payer: Medicaid Other | Admitting: General Surgery

## 2020-12-03 ENCOUNTER — Encounter: Payer: Self-pay | Admitting: General Surgery

## 2020-12-03 VITALS — BP 175/83 | HR 66 | Temp 98.3°F | Resp 16 | Ht 64.0 in | Wt 185.0 lb

## 2020-12-03 DIAGNOSIS — K862 Cyst of pancreas: Secondary | ICD-10-CM

## 2020-12-03 DIAGNOSIS — R079 Chest pain, unspecified: Secondary | ICD-10-CM

## 2020-12-03 NOTE — Progress Notes (Signed)
Subjective:     Maria Murphy  Here for follow-up office visit after recent discharge from [facility].  States her abdominal pain has eased, though she did have pizza the other day.  She denies any nausea or vomiting.  She denies any fever or chills. Objective:    BP (!) 175/83   Pulse 66   Temp 98.3 F (36.8 C) (Other (Comment))   Resp 16   Ht 5\' 4"  (1.626 m)   Wt 185 lb (83.9 kg)   SpO2 97%   BMI 31.76 kg/m   General:  alert, cooperative and no distress  Head is normocephalic, atraumatic Lungs clear to auscultation with good breath sounds bilaterally Heart examination reveals regular rate and rhythm without S3, S4, murmurs Abdomen is soft without significant point tenderness in the upper abdomen.  No rigidity is noted.     Assessment:    Epigastric and right upper quadrant abdominal pain of unknown etiology.  She does have a history of ongoing ulcer disease as well as cholelithiasis and a pancreatic pseudocyst.  An EUS was done recently which was nondiagnostic for the pancreatic cyst.  She has been referred to Dr. Barry Dienes in [place] for further evaluation.  She is requested a repeat MRI.  This has been ordered.    Plan:   Return to the office to go over the MRI/MRCP results.  Patient is already scheduled for follow-up EUS in a few months.  We will then make arrangements for the patient to see Dr. Barry Dienes.

## 2020-12-03 NOTE — Telephone Encounter (Signed)
Pt wants amLODipine (NORVASC) 2.5 MG tablet sent to  [place], [place] - [address]  [address] [place] [address]  Phone: [phone] Fax: [phone]

## 2020-12-03 NOTE — Telephone Encounter (Signed)
90 day supply with 3 refills sent in March. Patient needs to contact her pharmacy.

## 2020-12-04 ENCOUNTER — Other Ambulatory Visit: Payer: Self-pay | Admitting: Family Medicine

## 2020-12-04 ENCOUNTER — Ambulatory Visit: Payer: Self-pay | Admitting: Dermatology

## 2020-12-04 DIAGNOSIS — K862 Cyst of pancreas: Secondary | ICD-10-CM

## 2020-12-04 LAB — CBC WITH DIFFERENTIAL/PLATELET
Absolute Monocytes: 405 cells/uL (ref 200–950)
Basophils Absolute: 29 cells/uL (ref 0–200)
Basophils Relative: 0.5 %
Eosinophils Absolute: 160 cells/uL (ref 15–500)
Eosinophils Relative: 2.8 %
HCT: 31.3 % — ABNORMAL LOW (ref 35.0–45.0)
Hemoglobin: 10 g/dL — ABNORMAL LOW (ref 11.7–15.5)
Lymphs Abs: 1340 cells/uL (ref 850–3900)
MCH: 27 pg (ref 27.0–33.0)
MCHC: 31.9 g/dL — ABNORMAL LOW (ref 32.0–36.0)
MCV: 84.6 fL (ref 80.0–100.0)
MPV: 10.8 fL (ref 7.5–12.5)
Monocytes Relative: 7.1 %
Neutro Abs: 3768 cells/uL (ref 1500–7800)
Neutrophils Relative %: 66.1 %
Platelets: 199 10*3/uL (ref 140–400)
RBC: 3.7 10*6/uL — ABNORMAL LOW (ref 3.80–5.10)
RDW: 15.3 % — ABNORMAL HIGH (ref 11.0–15.0)
Total Lymphocyte: 23.5 %
WBC: 5.7 10*3/uL (ref 3.8–10.8)

## 2020-12-04 NOTE — Telephone Encounter (Signed)
NUC stress

## 2020-12-05 ENCOUNTER — Encounter (HOSPITAL_COMMUNITY): Payer: Self-pay | Admitting: Physical Therapy

## 2020-12-05 ENCOUNTER — Ambulatory Visit (HOSPITAL_COMMUNITY): Payer: Medicaid Other | Admitting: Physical Therapy

## 2020-12-05 ENCOUNTER — Other Ambulatory Visit: Payer: Self-pay

## 2020-12-05 DIAGNOSIS — R29898 Other symptoms and signs involving the musculoskeletal system: Secondary | ICD-10-CM

## 2020-12-05 DIAGNOSIS — M25662 Stiffness of left knee, not elsewhere classified: Secondary | ICD-10-CM

## 2020-12-05 DIAGNOSIS — M25661 Stiffness of right knee, not elsewhere classified: Secondary | ICD-10-CM

## 2020-12-05 DIAGNOSIS — M25561 Pain in right knee: Secondary | ICD-10-CM | POA: Diagnosis not present

## 2020-12-05 DIAGNOSIS — M25562 Pain in left knee: Secondary | ICD-10-CM

## 2020-12-05 DIAGNOSIS — M6281 Muscle weakness (generalized): Secondary | ICD-10-CM

## 2020-12-05 DIAGNOSIS — R2689 Other abnormalities of gait and mobility: Secondary | ICD-10-CM
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
## 2020-12-08 ENCOUNTER — Emergency Department (HOSPITAL_COMMUNITY)
Admission: EM | Admit: 2020-12-08 | Discharge: 2020-12-09 | Disposition: A | Discharge status: Home / Self Care | Payer: Medicaid Other | Attending: Emergency Medicine | Admitting: Emergency Medicine

## 2020-12-08 ENCOUNTER — Other Ambulatory Visit: Payer: Self-pay

## 2020-12-08 DIAGNOSIS — R1011 Right upper quadrant pain: Secondary | ICD-10-CM | POA: Diagnosis present

## 2020-12-08 DIAGNOSIS — Z79899 Other long term (current) drug therapy: Secondary | ICD-10-CM | POA: Diagnosis not present

## 2020-12-08 DIAGNOSIS — Z85828 Personal history of other malignant neoplasm of skin: Secondary | ICD-10-CM | POA: Insufficient documentation

## 2020-12-08 DIAGNOSIS — G8929 Other chronic pain: Secondary | ICD-10-CM | POA: Insufficient documentation

## 2020-12-08 DIAGNOSIS — I10 Essential (primary) hypertension: Secondary | ICD-10-CM | POA: Insufficient documentation

## 2020-12-08 DIAGNOSIS — Z7901 Long term (current) use of anticoagulants: Secondary | ICD-10-CM | POA: Insufficient documentation

## 2020-12-08 DIAGNOSIS — K8020 Calculus of gallbladder without cholecystitis without obstruction: Secondary | ICD-10-CM | POA: Insufficient documentation

## 2020-12-08 DIAGNOSIS — R1010 Upper abdominal pain, unspecified: Secondary | ICD-10-CM

## 2020-12-08 MED ORDER — FENTANYL CITRATE (PF) 100 MCG/2ML IJ SOLN
50.0000 ug | Freq: Once | INTRAMUSCULAR | Status: AC
  Administered 2020-12-09: 50 ug via INTRAVENOUS
  Filled 2020-12-08: qty 2

## 2020-12-08 MED ORDER — SODIUM CHLORIDE 0.9 % IV BOLUS
1000.0000 mL | Freq: Once | INTRAVENOUS | Status: AC
  Administered 2020-12-09: 1000 mL via INTRAVENOUS

## 2020-12-08 MED ORDER — ONDANSETRON HCL 4 MG/2ML IJ SOLN
4.0000 mg | Freq: Once | INTRAMUSCULAR | Status: AC
  Administered 2020-12-09: 4 mg via INTRAVENOUS
  Filled 2020-12-08: qty 2

## 2020-12-08 NOTE — ED Triage Notes (Addendum)
Pt c/o upper abdominal pain and nausea since Thursday. Pt states she took Zofran at home around 2100 with little relief.
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
## 2020-12-09 ENCOUNTER — Ambulatory Visit (HOSPITAL_COMMUNITY): Payer: Medicaid Other | Admitting: Physical Therapy

## 2020-12-09 ENCOUNTER — Ambulatory Visit: Payer: Medicaid Other | Attending: Internal Medicine | Admitting: Internal Medicine

## 2020-12-09 ENCOUNTER — Emergency Department (HOSPITAL_COMMUNITY): Payer: Medicaid Other

## 2020-12-09 ENCOUNTER — Encounter: Payer: Self-pay | Admitting: Internal Medicine

## 2020-12-09 VITALS — BP 139/86 | HR 62 | Resp 16 | Wt 183.6 lb

## 2020-12-09 DIAGNOSIS — I10 Essential (primary) hypertension: Secondary | ICD-10-CM | POA: Diagnosis not present

## 2020-12-09 DIAGNOSIS — K869 Disease of pancreas, unspecified: Secondary | ICD-10-CM | POA: Insufficient documentation

## 2020-12-09 DIAGNOSIS — Z86711 Personal history of pulmonary embolism: Secondary | ICD-10-CM | POA: Diagnosis not present

## 2020-12-09 DIAGNOSIS — G8929 Other chronic pain: Secondary | ICD-10-CM | POA: Diagnosis not present

## 2020-12-09 DIAGNOSIS — F32A Depression, unspecified: Secondary | ICD-10-CM | POA: Insufficient documentation

## 2020-12-09 DIAGNOSIS — Z8249 Family history of ischemic heart disease and other diseases of the circulatory system: Secondary | ICD-10-CM | POA: Diagnosis not present

## 2020-12-09 DIAGNOSIS — Z09 Encounter for follow-up examination after completed treatment for conditions other than malignant neoplasm: Secondary | ICD-10-CM | POA: Diagnosis not present

## 2020-12-09 DIAGNOSIS — K267 Chronic duodenal ulcer without hemorrhage or perforation: Secondary | ICD-10-CM | POA: Insufficient documentation

## 2020-12-09 DIAGNOSIS — K8020 Calculus of gallbladder without cholecystitis without obstruction: Secondary | ICD-10-CM | POA: Insufficient documentation

## 2020-12-09 DIAGNOSIS — Z79899 Other long term (current) drug therapy: Secondary | ICD-10-CM | POA: Diagnosis not present

## 2020-12-09 DIAGNOSIS — Z79891 Long term (current) use of opiate analgesic: Secondary | ICD-10-CM

## 2020-12-09 DIAGNOSIS — K277 Chronic peptic ulcer, site unspecified, without hemorrhage or perforation: Secondary | ICD-10-CM | POA: Diagnosis not present

## 2020-12-09 DIAGNOSIS — K8689 Other specified diseases of pancreas: Secondary | ICD-10-CM

## 2020-12-09 DIAGNOSIS — G629 Polyneuropathy, unspecified: Secondary | ICD-10-CM | POA: Diagnosis not present

## 2020-12-09 DIAGNOSIS — R109 Unspecified abdominal pain: Secondary | ICD-10-CM

## 2020-12-09 DIAGNOSIS — Z7901 Long term (current) use of anticoagulants: Secondary | ICD-10-CM | POA: Diagnosis not present

## 2020-12-09 LAB — CBC WITH DIFFERENTIAL/PLATELET
Abs Immature Granulocytes: 0.02 10*3/uL (ref 0.00–0.07)
Basophils Absolute: 0 10*3/uL (ref 0.0–0.1)
Basophils Relative: 0 %
Eosinophils Absolute: 0.1 10*3/uL (ref 0.0–0.5)
Eosinophils Relative: 2 %
HCT: 31 % — ABNORMAL LOW (ref 36.0–46.0)
Hemoglobin: 9.7 g/dL — ABNORMAL LOW (ref 12.0–15.0)
Immature Granulocytes: 0 %
Lymphocytes Relative: 15 %
Lymphs Abs: 1.1 10*3/uL (ref 0.7–4.0)
MCH: 26.7 pg (ref 26.0–34.0)
MCHC: 31.3 g/dL (ref 30.0–36.0)
MCV: 85.4 fL (ref 80.0–100.0)
Monocytes Absolute: 0.5 10*3/uL (ref 0.1–1.0)
Monocytes Relative: 7 %
Neutro Abs: 5.3 10*3/uL (ref 1.7–7.7)
Neutrophils Relative %: 76 %
Platelets: 220 10*3/uL (ref 150–400)
RBC: 3.63 MIL/uL — ABNORMAL LOW (ref 3.87–5.11)
RDW: 15.7 % — ABNORMAL HIGH (ref 11.5–15.5)
WBC: 7 10*3/uL (ref 4.0–10.5)
nRBC: 0 % (ref 0.0–0.2)

## 2020-12-09 LAB — COMPREHENSIVE METABOLIC PANEL
ALT: 7 U/L (ref 0–44)
AST: 17 U/L (ref 15–41)
Albumin: 4 g/dL (ref 3.5–5.0)
Alkaline Phosphatase: 46 U/L (ref 38–126)
Anion gap: 7 (ref 5–15)
BUN: 27 mg/dL — ABNORMAL HIGH (ref 8–23)
CO2: 22 mmol/L (ref 22–32)
Calcium: 8.9 mg/dL (ref 8.9–10.3)
Chloride: 107 mmol/L (ref 98–111)
Creatinine, Ser: 0.83 mg/dL (ref 0.44–1.00)
GFR, Estimated: >60 mL/min (ref >60)
Glucose, Bld: 119 mg/dL — ABNORMAL HIGH (ref 70–99)
Potassium: 3.6 mmol/L (ref 3.5–5.1)
Sodium: 136 mmol/L (ref 135–145)
Total Bilirubin: 0.3 mg/dL (ref 0.3–1.2)
Total Protein: 7 g/dL (ref 6.5–8.1)

## 2020-12-09 LAB — TROPONIN I (HIGH SENSITIVITY)
Troponin I (High Sensitivity): 3 ng/L (ref <18)
Troponin I (High Sensitivity): 3 ng/L (ref <18)

## 2020-12-09 LAB — LIPASE, BLOOD: Lipase: 35 U/L (ref 11–51)

## 2020-12-09 LAB — POC OCCULT BLOOD, ED: Fecal Occult Bld: NEGATIVE

## 2020-12-09 MED ORDER — PANTOPRAZOLE SODIUM 40 MG IV SOLR
40.0000 mg | Freq: Once | INTRAVENOUS | Status: AC
  Administered 2020-12-09: 40 mg via INTRAVENOUS
  Filled 2020-12-09: qty 40

## 2020-12-09 MED ORDER — ALUM & MAG HYDROXIDE-SIMETH 200-200-20 MG/5ML PO SUSP
30.0000 mL | Freq: Once | ORAL | Status: AC
  Administered 2020-12-09: 30 mL via ORAL
  Filled 2020-12-09: qty 30

## 2020-12-09 MED ORDER — TRAMADOL HCL 50 MG PO TABS: 50.0000 mg | ORAL_TABLET | Freq: Three times a day (TID) | ORAL | 0 refills | Status: DC | PRN

## 2020-12-09 MED ORDER — SUCRALFATE 1 GM/10ML PO SUSP
1.0000 g | Freq: Once | ORAL | Status: AC
  Administered 2020-12-09: 1 g via ORAL
  Filled 2020-12-09: qty 10

## 2020-12-09 MED ORDER — IOHEXOL 300 MG/ML  SOLN
100.0000 mL | Freq: Once | INTRAMUSCULAR | Status: AC | PRN
  Administered 2020-12-09: 100 mL via INTRAVENOUS

## 2020-12-09 MED ORDER — LIDOCAINE VISCOUS HCL 2 % MT SOLN
15.0000 mL | Freq: Once | OROMUCOSAL | Status: AC
  Administered 2020-12-09: 15 mL via ORAL
  Filled 2020-12-09: qty 15

## 2020-12-09 MED ORDER — AMLODIPINE BESYLATE 2.5 MG PO TABS: 2.5000 mg | ORAL_TABLET | Freq: Every day | ORAL | 3 refills | Status: DC

## 2020-12-09 MED ORDER — HYDROMORPHONE HCL 1 MG/ML IJ SOLN
1.0000 mg | Freq: Once | INTRAMUSCULAR | Status: AC
  Administered 2020-12-09: 1 mg via INTRAVENOUS
  Filled 2020-12-09: qty 1
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
## 2020-12-09 NOTE — Discharge Instructions (Signed)
Your test results today are stable including your labs and CT scan.  There is no blood in your stool.  Continue to take upon the protonix, pain and nausea medications as prescribed.  Follow-up with your stomach specialists and surgeons as scheduled. Return to the ED with new or worsening symptoms.

## 2020-12-10 ENCOUNTER — Ambulatory Visit (HOSPITAL_COMMUNITY): Payer: Medicaid Other | Admitting: Physical Therapy

## 2020-12-10 LAB — DRUG SCREEN 764883 11+OXYCO+ALC+CRT-BUND
Amphetamines, Urine: NEGATIVE ng/mL
BENZODIAZ UR QL: NEGATIVE ng/mL
Barbiturate: NEGATIVE ng/mL
Cannabinoid Quant, Ur: NEGATIVE ng/mL
Cocaine (Metabolite): NEGATIVE ng/mL
Creatinine: 139 mg/dL (ref 20.0–300.0)
Ethanol: NEGATIVE %
Meperidine: NEGATIVE ng/mL
Methadone Screen, Urine: NEGATIVE ng/mL
OPIATE SCREEN URINE: NEGATIVE ng/mL
Oxycodone/Oxymorphone, Urine: NEGATIVE ng/mL
Phencyclidine: NEGATIVE ng/mL
Propoxyphene: NEGATIVE ng/mL
Tramadol: NEGATIVE ng/mL
pH, Urine: 5.5 (ref 4.5–8.9)

## 2020-12-11 ENCOUNTER — Telehealth (HOSPITAL_COMMUNITY): Payer: Self-pay | Admitting: Physical Therapy

## 2020-12-11 ENCOUNTER — Ambulatory Visit (HOSPITAL_COMMUNITY): Payer: Medicaid Other | Admitting: Physical Therapy

## 2020-12-11 ENCOUNTER — Telehealth (HOSPITAL_COMMUNITY): Payer: Self-pay

## 2020-12-11 NOTE — Telephone Encounter (Signed)
Detailed instructions left on the patient's answering machine. Asked to call back with any questions. Maria Murphy EMTP 

## 2020-12-11 NOTE — Telephone Encounter (Signed)
pt came in and cancelled because she is having severe stomach cramps

## 2020-12-12 ENCOUNTER — Ambulatory Visit (HOSPITAL_COMMUNITY): Payer: Medicaid Other | Attending: Cardiology

## 2020-12-12 ENCOUNTER — Other Ambulatory Visit: Payer: Self-pay

## 2020-12-12 ENCOUNTER — Inpatient Hospital Stay: Payer: Medicaid Other | Admitting: Internal Medicine

## 2020-12-12 DIAGNOSIS — R0602 Shortness of breath: Secondary | ICD-10-CM | POA: Diagnosis not present

## 2020-12-12 DIAGNOSIS — Z01810 Encounter for preprocedural cardiovascular examination: Secondary | ICD-10-CM | POA: Diagnosis not present

## 2020-12-12 DIAGNOSIS — Z01818 Encounter for other preprocedural examination: Secondary | ICD-10-CM | POA: Insufficient documentation

## 2020-12-12 LAB — MYOCARDIAL PERFUSION IMAGING
LV dias vol: 90 mL (ref 46–106)
LV sys vol: 36 mL
Peak HR: 90 {beats}/min
Rest HR: 57 {beats}/min
SDS: 0
SRS: 0
SSS: 0
TID: 1.03

## 2020-12-12 MED ORDER — TECHNETIUM TC 99M TETROFOSMIN IV KIT
31.6000 | PACK | Freq: Once | INTRAVENOUS | Status: AC | PRN
  Administered 2020-12-12: 31.6 via INTRAVENOUS
  Filled 2020-12-12: qty 32

## 2020-12-12 MED ORDER — TECHNETIUM TC 99M TETROFOSMIN IV KIT
10.6000 | PACK | Freq: Once | INTRAVENOUS | Status: AC | PRN
Start: 2020-12-12 — End: 2020-12-12
  Administered 2020-12-12: 10.6 via INTRAVENOUS
  Filled 2020-12-12: qty 11

## 2020-12-12 MED ORDER — REGADENOSON 0.4 MG/5ML IV SOLN
0.4000 mg | Freq: Once | INTRAVENOUS | Status: AC
  Administered 2020-12-12: 0.4 mg via INTRAVENOUS

## 2020-12-13 ENCOUNTER — Inpatient Hospital Stay: Payer: Medicaid Other | Admitting: Internal Medicine

## 2020-12-13 ENCOUNTER — Telehealth: Payer: Self-pay | Admitting: Nurse Practitioner

## 2020-12-13 DIAGNOSIS — K269 Duodenal ulcer, unspecified as acute or chronic, without hemorrhage or perforation: Secondary | ICD-10-CM

## 2020-12-13 DIAGNOSIS — K219 Gastro-esophageal reflux disease without esophagitis: Secondary | ICD-10-CM

## 2020-12-13 DIAGNOSIS — K253 Acute gastric ulcer without hemorrhage or perforation: Secondary | ICD-10-CM

## 2020-12-13 MED ORDER — LIDOCAINE VISCOUS HCL 2 % MT SOLN
15.0000 mL | Freq: Four times a day (QID) | OROMUCOSAL | 1 refills | Status: DC | PRN
Start: 2020-12-13 — End: 2021-01-27

## 2020-12-13 NOTE — Telephone Encounter (Signed)
Received page notification from the hospital switchboard operator.  I called the patient who indicated she is having recurrent abdominal pain.  She took oxycodone last night which helped, but is now wearing off and she does not want to take too many opioids.  Reviewed her chart including extensive GI evaluation for esophagitis, nonhealing duodenal ulcer, constipation.  Most recently she had an EGD and EUS with a 22 mm persistent duodenal ulcer status post biopsy, LA grade C esophagitis.  Informed the patient I would send in viscous lidocaine to help with her symptoms (she was worried about possibly taking too much oxycodone). Rx sent to [facility] in [place].

## 2020-12-14 ENCOUNTER — Emergency Department (HOSPITAL_COMMUNITY): Payer: Medicaid Other

## 2020-12-14 ENCOUNTER — Encounter (HOSPITAL_COMMUNITY): Payer: Self-pay

## 2020-12-14 ENCOUNTER — Other Ambulatory Visit: Payer: Self-pay

## 2020-12-14 ENCOUNTER — Emergency Department (HOSPITAL_COMMUNITY)
Admission: EM | Admit: 2020-12-14 | Discharge: 2020-12-14 | Disposition: A | Discharge status: Home / Self Care | Payer: Medicaid Other | Attending: Emergency Medicine | Admitting: Emergency Medicine

## 2020-12-14 DIAGNOSIS — I10 Essential (primary) hypertension: Secondary | ICD-10-CM | POA: Diagnosis not present

## 2020-12-14 DIAGNOSIS — M545 Low back pain, unspecified: Secondary | ICD-10-CM | POA: Diagnosis not present

## 2020-12-14 DIAGNOSIS — R1013 Epigastric pain: Secondary | ICD-10-CM | POA: Insufficient documentation

## 2020-12-14 DIAGNOSIS — Z79899 Other long term (current) drug therapy: Secondary | ICD-10-CM | POA: Insufficient documentation

## 2020-12-14 DIAGNOSIS — Z7901 Long term (current) use of anticoagulants: Secondary | ICD-10-CM | POA: Diagnosis not present

## 2020-12-14 DIAGNOSIS — R1011 Right upper quadrant pain: Secondary | ICD-10-CM | POA: Insufficient documentation

## 2020-12-14 DIAGNOSIS — R112 Nausea with vomiting, unspecified: Secondary | ICD-10-CM | POA: Insufficient documentation

## 2020-12-14 DIAGNOSIS — R1010 Upper abdominal pain, unspecified: Secondary | ICD-10-CM

## 2020-12-14 DIAGNOSIS — K219 Gastro-esophageal reflux disease without esophagitis: Secondary | ICD-10-CM | POA: Insufficient documentation

## 2020-12-14 DIAGNOSIS — Z853 Personal history of malignant neoplasm of breast: Secondary | ICD-10-CM | POA: Insufficient documentation

## 2020-12-14 LAB — LIPASE, BLOOD: Lipase: 31 U/L (ref 11–51)

## 2020-12-14 LAB — CBC WITH DIFFERENTIAL/PLATELET
Abs Immature Granulocytes: 0.02 10*3/uL (ref 0.00–0.07)
Basophils Absolute: 0 10*3/uL (ref 0.0–0.1)
Basophils Relative: 0 %
Eosinophils Absolute: 0.1 10*3/uL (ref 0.0–0.5)
Eosinophils Relative: 1 %
HCT: 32 % — ABNORMAL LOW (ref 36.0–46.0)
Hemoglobin: 10.1 g/dL — ABNORMAL LOW (ref 12.0–15.0)
Immature Granulocytes: 0 %
Lymphocytes Relative: 16 %
Lymphs Abs: 1.1 10*3/uL (ref 0.7–4.0)
MCH: 26.9 pg (ref 26.0–34.0)
MCHC: 31.6 g/dL (ref 30.0–36.0)
MCV: 85.3 fL (ref 80.0–100.0)
Monocytes Absolute: 0.4 10*3/uL (ref 0.1–1.0)
Monocytes Relative: 6 %
Neutro Abs: 5.4 10*3/uL (ref 1.7–7.7)
Neutrophils Relative %: 77 %
Platelets: 245 10*3/uL (ref 150–400)
RBC: 3.75 MIL/uL — ABNORMAL LOW (ref 3.87–5.11)
RDW: 15.4 % (ref 11.5–15.5)
WBC: 7 10*3/uL (ref 4.0–10.5)
nRBC: 0 % (ref 0.0–0.2)

## 2020-12-14 LAB — COMPREHENSIVE METABOLIC PANEL
ALT: 8 U/L (ref 0–44)
AST: 19 U/L (ref 15–41)
Albumin: 4.3 g/dL (ref 3.5–5.0)
Alkaline Phosphatase: 55 U/L (ref 38–126)
Anion gap: 9 (ref 5–15)
BUN: 16 mg/dL (ref 8–23)
CO2: 23 mmol/L (ref 22–32)
Calcium: 9.2 mg/dL (ref 8.9–10.3)
Chloride: 105 mmol/L (ref 98–111)
Creatinine, Ser: 0.67 mg/dL (ref 0.44–1.00)
GFR, Estimated: >60 mL/min (ref >60)
Glucose, Bld: 121 mg/dL — ABNORMAL HIGH (ref 70–99)
Potassium: 3.7 mmol/L (ref 3.5–5.1)
Sodium: 137 mmol/L (ref 135–145)
Total Bilirubin: 0.4 mg/dL (ref 0.3–1.2)
Total Protein: 7.6 g/dL (ref 6.5–8.1)

## 2020-12-14 LAB — TROPONIN I (HIGH SENSITIVITY)
Troponin I (High Sensitivity): 3 ng/L (ref <18)
Troponin I (High Sensitivity): 3 ng/L (ref <18)

## 2020-12-14 MED ORDER — KETAMINE HCL 10 MG/ML IJ SOLN
0.3000 mg/kg | Freq: Once | INTRAMUSCULAR | Status: AC
  Administered 2020-12-14: 25 mg via INTRAVENOUS

## 2020-12-14 MED ORDER — IOHEXOL 350 MG/ML SOLN
100.0000 mL | Freq: Once | INTRAVENOUS | Status: AC | PRN
  Administered 2020-12-14: 100 mL via INTRAVENOUS

## 2020-12-14 MED ORDER — ONDANSETRON HCL 4 MG/2ML IJ SOLN
4.0000 mg | Freq: Once | INTRAMUSCULAR | Status: AC
  Administered 2020-12-14: 4 mg via INTRAVENOUS
  Filled 2020-12-14: qty 2

## 2020-12-14 NOTE — ED Provider Notes (Signed)
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 her symptoms are improved and she is tolerating p.o.  She has all of her medications at home including her PPI and pain and nausea medications. Advised to avoid alcohol, caffeine, NSAIDs, spicy foods.  She is tolerating p.o.  Her abdomen is soft.  She appears stable for outpatient follow-up.  Return precautions discussed.  Final Clinical Impression(s) / ED Diagnoses Final diagnoses:  Upper abdominal pain    Rx / DC Orders ED Discharge Orders    None       Navie Lamoreaux, Annie Main, MD 12/14/20 [phone]

## 2020-12-14 NOTE — ED Triage Notes (Signed)
Pt arrived via POV c/o repeat abdominal pain in upper mid-right sharp pain. Pt is following up with Dr. Arnoldo Morale and Dr. Gala Romney regarding chronic abdominal pain issues.

## 2020-12-14 NOTE — Discharge Instructions (Signed)
Continue stomach medications.  Avoid alcohol, caffeine, spicy foods, NSAID medications.  Follow-up with your primary doctor as well as your gastroenterologist and surgeon specialist.  Return to the ED with worsening pain, fever, vomiting, any other concerns.

## 2020-12-15 ENCOUNTER — Other Ambulatory Visit: Payer: Self-pay

## 2020-12-15 ENCOUNTER — Encounter (HOSPITAL_COMMUNITY): Payer: Self-pay | Admitting: Emergency Medicine

## 2020-12-15 ENCOUNTER — Emergency Department (HOSPITAL_COMMUNITY)
Admission: EM | Admit: 2020-12-15 | Discharge: 2020-12-15 | Disposition: A | Discharge status: Home / Self Care | Payer: Medicaid Other | Attending: Emergency Medicine | Admitting: Emergency Medicine

## 2020-12-15 DIAGNOSIS — R112 Nausea with vomiting, unspecified: Secondary | ICD-10-CM | POA: Diagnosis not present

## 2020-12-15 DIAGNOSIS — Z8719 Personal history of other diseases of the digestive system: Secondary | ICD-10-CM | POA: Insufficient documentation

## 2020-12-15 DIAGNOSIS — Z79899 Other long term (current) drug therapy: Secondary | ICD-10-CM | POA: Diagnosis not present

## 2020-12-15 DIAGNOSIS — R1013 Epigastric pain: Secondary | ICD-10-CM | POA: Diagnosis not present

## 2020-12-15 DIAGNOSIS — I10 Essential (primary) hypertension: Secondary | ICD-10-CM | POA: Insufficient documentation

## 2020-12-15 DIAGNOSIS — R1011 Right upper quadrant pain: Secondary | ICD-10-CM | POA: Diagnosis not present

## 2020-12-15 DIAGNOSIS — Z85828 Personal history of other malignant neoplasm of skin: Secondary | ICD-10-CM | POA: Insufficient documentation

## 2020-12-15 DIAGNOSIS — R197 Diarrhea, unspecified: Secondary | ICD-10-CM | POA: Diagnosis not present

## 2020-12-15 DIAGNOSIS — G8929 Other chronic pain: Secondary | ICD-10-CM | POA: Diagnosis not present

## 2020-12-15 LAB — CBC WITH DIFFERENTIAL/PLATELET
Abs Immature Granulocytes: 0 10*3/uL (ref 0.00–0.07)
Basophils Absolute: 0 10*3/uL (ref 0.0–0.1)
Basophils Relative: 1 %
Eosinophils Absolute: 0.1 10*3/uL (ref 0.0–0.5)
Eosinophils Relative: 2 %
HCT: 32.1 % — ABNORMAL LOW (ref 36.0–46.0)
Hemoglobin: 10.1 g/dL — ABNORMAL LOW (ref 12.0–15.0)
Immature Granulocytes: 0 %
Lymphocytes Relative: 22 %
Lymphs Abs: 1.1 10*3/uL (ref 0.7–4.0)
MCH: 27.2 pg (ref 26.0–34.0)
MCHC: 31.5 g/dL (ref 30.0–36.0)
MCV: 86.3 fL (ref 80.0–100.0)
Monocytes Absolute: 0.4 10*3/uL (ref 0.1–1.0)
Monocytes Relative: 8 %
Neutro Abs: 3.4 10*3/uL (ref 1.7–7.7)
Neutrophils Relative %: 67 %
Platelets: 248 10*3/uL (ref 150–400)
RBC: 3.72 MIL/uL — ABNORMAL LOW (ref 3.87–5.11)
RDW: 15.7 % — ABNORMAL HIGH (ref 11.5–15.5)
WBC: 5 10*3/uL (ref 4.0–10.5)
nRBC: 0 % (ref 0.0–0.2)

## 2020-12-15 LAB — COMPREHENSIVE METABOLIC PANEL
ALT: 7 U/L (ref 0–44)
AST: 16 U/L (ref 15–41)
Albumin: 4.2 g/dL (ref 3.5–5.0)
Alkaline Phosphatase: 54 U/L (ref 38–126)
Anion gap: 6 (ref 5–15)
BUN: 16 mg/dL (ref 8–23)
CO2: 26 mmol/L (ref 22–32)
Calcium: 9.1 mg/dL (ref 8.9–10.3)
Chloride: 106 mmol/L (ref 98–111)
Creatinine, Ser: 0.74 mg/dL (ref 0.44–1.00)
GFR, Estimated: >60 mL/min (ref >60)
Glucose, Bld: 123 mg/dL — ABNORMAL HIGH (ref 70–99)
Potassium: 4.1 mmol/L (ref 3.5–5.1)
Sodium: 138 mmol/L (ref 135–145)
Total Bilirubin: 0.4 mg/dL (ref 0.3–1.2)
Total Protein: 7.3 g/dL (ref 6.5–8.1)

## 2020-12-15 LAB — LIPASE, BLOOD: Lipase: 36 U/L (ref 11–51)

## 2020-12-15 LAB — TROPONIN I (HIGH SENSITIVITY)
Troponin I (High Sensitivity): 2 ng/L (ref <18)
Troponin I (High Sensitivity): 2 ng/L (ref <18)

## 2020-12-15 MED ORDER — ONDANSETRON 4 MG PO TBDP
4.0000 mg | ORAL_TABLET | Freq: Once | ORAL | Status: AC
  Administered 2020-12-15: 4 mg via ORAL
  Filled 2020-12-15: qty 1

## 2020-12-15 MED ORDER — ALUM & MAG HYDROXIDE-SIMETH 200-200-20 MG/5ML PO SUSP
30.0000 mL | Freq: Once | ORAL | Status: AC
  Administered 2020-12-15: 30 mL via ORAL
  Filled 2020-12-15: qty 30

## 2020-12-15 MED ORDER — ONDANSETRON HCL 4 MG/2ML IJ SOLN
8.0000 mg | Freq: Once | INTRAMUSCULAR | Status: AC
  Administered 2020-12-15: 8 mg via INTRAMUSCULAR
  Filled 2020-12-15: qty 4

## 2020-12-15 MED ORDER — OXYCODONE HCL 5 MG PO TABS
10.0000 mg | ORAL_TABLET | Freq: Once | ORAL | Status: AC
  Administered 2020-12-15: 10 mg via ORAL
  Filled 2020-12-15: qty 2

## 2020-12-15 MED ORDER — LIDOCAINE VISCOUS HCL 2 % MT SOLN
15.0000 mL | Freq: Once | OROMUCOSAL | Status: AC
  Administered 2020-12-15: 15 mL via ORAL
  Filled 2020-12-15: qty 15

## 2020-12-15 NOTE — ED Triage Notes (Signed)
Pt with c/o abdominal pain, nausea, vomiting, and diarrhea. Seen here last night for same.

## 2020-12-15 NOTE — ED Notes (Signed)
Pt states she is does not want to try the PO challenge as she is afraid of "gagging."  Pt was informed the PO challenge is needed to discharge and she agrees to try.

## 2020-12-15 NOTE — ED Notes (Signed)
Pt denies nausea at this time, water provided for PO fluid challenge. Lab at bedside

## 2020-12-15 NOTE — ED Notes (Signed)
Rectal exam chaperoned with Dr. Chauncey Cruel. Rancour, pt up to bathroom numerous times, reports she is having "a big gush of water" referring to diarrhea each visit to bathroom. EDP at bedside and aware, hemoccult collected.

## 2020-12-15 NOTE — Discharge Instructions (Signed)
Take your pain and nausea medications as prescribed.  Continue your stomach medication.  Avoid alcohol, caffeine, spicy foods.  Return to the ED with worsening pain, fever, vomiting, any other concerns.

## 2020-12-15 NOTE — ED Provider Notes (Signed)
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 including her PPI and pain and nausea medications. Advised to avoid alcohol, caffeine, NSAIDs, spicy foods.  She is tolerating p.o.  Her abdomen is soft.  She appears stable for outpatient follow-up.  Return precautions discussed.  Final Clinical Impression(s) / ED Diagnoses Final diagnoses:  Chronic abdominal pain    Rx / DC Orders ED Discharge Orders    None       Bertrice Leder, Annie Main, MD 12/15/20 [phone]

## 2020-12-16 ENCOUNTER — Ambulatory Visit (HOSPITAL_COMMUNITY): Payer: Medicaid Other | Admitting: Physical Therapy

## 2020-12-16 ENCOUNTER — Telehealth: Payer: Self-pay

## 2020-12-16 ENCOUNTER — Emergency Department (HOSPITAL_COMMUNITY)
Admission: EM | Admit: 2020-12-16 | Discharge: 2020-12-16 | Disposition: A | Discharge status: Home / Self Care | Payer: Medicaid Other | Attending: Emergency Medicine | Admitting: Emergency Medicine

## 2020-12-16 ENCOUNTER — Telehealth: Payer: Self-pay | Admitting: Internal Medicine

## 2020-12-16 ENCOUNTER — Other Ambulatory Visit: Payer: Self-pay

## 2020-12-16 ENCOUNTER — Encounter (HOSPITAL_COMMUNITY): Payer: Self-pay

## 2020-12-16 DIAGNOSIS — Z7901 Long term (current) use of anticoagulants: Secondary | ICD-10-CM | POA: Insufficient documentation

## 2020-12-16 DIAGNOSIS — I10 Essential (primary) hypertension: Secondary | ICD-10-CM | POA: Diagnosis not present

## 2020-12-16 DIAGNOSIS — G8929 Other chronic pain: Secondary | ICD-10-CM | POA: Diagnosis not present

## 2020-12-16 DIAGNOSIS — Z79899 Other long term (current) drug therapy: Secondary | ICD-10-CM | POA: Insufficient documentation

## 2020-12-16 DIAGNOSIS — Z85828 Personal history of other malignant neoplasm of skin: Secondary | ICD-10-CM | POA: Insufficient documentation

## 2020-12-16 DIAGNOSIS — R109 Unspecified abdominal pain: Secondary | ICD-10-CM | POA: Diagnosis present

## 2020-12-16 DIAGNOSIS — R10811 Right upper quadrant abdominal tenderness: Secondary | ICD-10-CM | POA: Diagnosis not present

## 2020-12-16 NOTE — Telephone Encounter (Signed)
PATIENT CALLED AGAIN.  SHE IS UPSET NO ONE HAS CALLED HER YET ABOUT HER ABDOMINAL PAIN

## 2020-12-16 NOTE — ED Provider Notes (Signed)
[facility] Provider Note  CSN: [id number] Arrival date & time: 12/16/20 0321    History Chief Complaint  Patient presents with  . Abdominal Pain    HPI  Maria Murphy is a 65 y.o. female with history of chronic abdominal pain, multifactorial but at least in part due to PUD, cholelithiasis and a known pancreatic cyst. She has been seen in the ED numerous times, been admitted several times and has been evaluated by GI multiple times as well. She has been referred to tertiary care for her complicated problem but has not yet been seen. She has been in the ED the last three nights for same. Had extensive workup two nights ago including a CT that showed no change in pancreatic cyst. She was previously getting oxycodone from GI which she said did not work. More recently, she got Tramadol from [facility] but that has not been effective either. She denies any vomiting to night, has had 3 non blood BM today.    Past Medical History:  Diagnosis Date  . Collagen vascular disease ([place])   . Essential hypertension, benign 01/24/2015  . Foot ulcer ([place])   . GERD (gastroesophageal reflux disease)   . Hypertension   . Memory loss   . PUD (peptic ulcer disease)    with perforation s/p exploratory laparotomy  . Pulmonary embolus ([place]) 04/2020  . Skin cancer   . Vision abnormalities     Past Surgical History:  Procedure Laterality Date  . BIOPSY  11/20/2020   Procedure: BIOPSY;  Surgeon: Rush Landmark Telford Nab., MD;  Location: [facility];  Service: Gastroenterology;;  . CENTRAL LINE INSERTION Right 04/22/2020   Procedure: CENTRAL LINE INSERTION;  Surgeon: Virl Cagey, MD;  Location: [facility];  Service: General;  Laterality: Right;  . DIAGNOSTIC LAPAROSCOPY    . ESOPHAGOGASTRODUODENOSCOPY N/A 11/20/2020   Procedure: ESOPHAGOGASTRODUODENOSCOPY (EGD);  Surgeon: Irving Copas., MD;  Location: [facility];  Service: Gastroenterology;  Laterality: N/A;  .  ESOPHAGOGASTRODUODENOSCOPY (EGD) WITH PROPOFOL N/A 07/10/2020   Procedure: ESOPHAGOGASTRODUODENOSCOPY (EGD) WITH PROPOFOL;  Surgeon: Rogene Houston, MD;  [facility];  Service: Endoscopy;  Laterality: N/A;  . ESOPHAGOGASTRODUODENOSCOPY (EGD) WITH PROPOFOL N/A 10/18/2020   Procedure: ESOPHAGOGASTRODUODENOSCOPY (EGD) WITH PROPOFOL;  Surgeon: Harvel Quale, MD;  Location: [facility];  Service: Gastroenterology;  Laterality: N/A;  . EUS N/A 11/20/2020   Procedure: UPPER ENDOSCOPIC ULTRASOUND (EUS) LINEAR;  Surgeon: Irving Copas., MD;  Location: [facility];  Service: Gastroenterology;  Laterality: N/A;  . GASTRORRHAPHY  04/22/2020   Procedure: GASTRORRHAPHY;  Surgeon: Virl Cagey, MD;  Location: [facility];  Service: General;;  . IR CATHETER TUBE CHANGE  05/09/2020  . LAPAROTOMY N/A 04/22/2020   Procedure: EXPLORATORY LAPAROTOMY;  Surgeon: Virl Cagey, MD;  Location: [facility];  Service: General;  Laterality: N/A;  . TONSILLECTOMY      Family History  Problem Relation Age of Onset  . High Cholesterol Mother   . Hypertension Mother   . Arthritis/Rheumatoid Mother   . Alcohol abuse Father   . Hypertension Maternal Grandmother   . Colon cancer Neg Hx   . Colon polyps Neg Hx   . Inflammatory bowel disease Neg Hx     Social History   Tobacco Use  . Smoking status: Never Smoker  . Smokeless tobacco: Never Used  Vaping Use  . Vaping Use: Never used  Substance Use Topics  . Alcohol use: Yes    Alcohol/week: 2.0 - 3.0  standard drinks    Types: 2 - 3 Glasses of wine per week  . Drug use: No     Home Medications Prior to Admission medications   Medication Sig Start Date End Date Taking? Authorizing Provider  acetaminophen (TYLENOL) 500 MG tablet Take 1 tablet (500 mg total) by mouth every 6 (six) hours as needed for mild pain or fever. 09/27/20   Johnson, Clanford L, MD  amLODipine (NORVASC) 2.5 MG tablet Take 1 tablet (2.5 mg total) by mouth  daily. For BP 12/09/20   Marcine MatarJohnson, Deborah B, MD  citalopram (CELEXA) 20 MG tablet Take 20 mg by mouth daily. 11/04/20   [provider]  dicyclomine (BENTYL) 10 MG capsule Take 10 mg by mouth 2 (two) times daily. 11/04/20   [provider]  donepezil (ARICEPT) 10 MG tablet Take 0.5-1 tablets (5-10 mg total) by mouth in the morning and at bedtime. 09/20/20   Marcine MatarJohnson, Deborah B, MD  famotidine (PEPCID) 20 MG tablet Take 1 tablet (20 mg total) by mouth 2 (two) times daily. 11/11/20   Pricilla LovelessGoldston, Scott, MD  ferrous sulfate 325 (65 FE) MG tablet Take 1 tablet (325 mg total) by mouth daily with breakfast. 11/03/20   Shon HaleEmokpae, Courage, MD  gabapentin (NEURONTIN) 300 MG capsule Take 1 capsule (300 mg total) by mouth 2 (two) times daily. 10/08/20   Marcine MatarJohnson, Deborah B, MD  lactose free nutrition (BOOST) LIQD Take 237 mLs by mouth 2 (two) times daily.    [provider]  lidocaine (XYLOCAINE) 2 % solution Use as directed 15 mLs in the mouth or throat every 6 (six) hours as needed (esophagus/abdominal pain). 12/13/20   Anice PaganiniGill, Eric A, NP  lipase/protease/amylase (CREON) 36000 UNITS CPEP capsule Take 2 capsules (72,000 Units total) by mouth 3 (three) times daily with meals. May also take 1 capsule (36,000 Units total) as needed (with snacks). 10/16/20   Letta MedianHarper, Kristen S, PA-C  mirtazapine (REMERON) 15 MG tablet Take 1 tablet (15 mg total) by mouth at bedtime. 10/08/20   Marcine MatarJohnson, Deborah B, MD  ondansetron (ZOFRAN ODT) 8 MG disintegrating tablet Take 1 tablet (8 mg total) by mouth every 8 (eight) hours as needed for nausea or vomiting. 10/16/20   Letta MedianHarper, Kristen S, PA-C  pantoprazole (PROTONIX) 40 MG tablet Take 1 tablet (40 mg total) by mouth 2 (two) times daily. 11/03/20 12/03/20  Shon HaleEmokpae, Courage, MD  promethazine (PHENERGAN) 25 MG suppository Place 1 suppository (25 mg total) rectally every 6 (six) hours as needed for nausea or vomiting. 11/11/20   Pricilla LovelessGoldston, Scott, MD  promethazine (PHENERGAN) 25 MG  tablet Take 1 tablet (25 mg total) by mouth every 6 (six) hours as needed for nausea or vomiting. 11/11/20   Pricilla LovelessGoldston, Scott, MD  rivaroxaban (XARELTO) 10 MG TABS tablet Take 1 tablet (10 mg total) by mouth every evening. 11/08/20   Shon HaleEmokpae, Courage, MD  sucralfate (CARAFATE) 1 GM/10ML suspension Take 10 mLs (1 g total) by mouth 4 (four) times daily -  with meals and at bedtime. 11/03/20   Shon HaleEmokpae, Courage, MD  traMADol (ULTRAM) 50 MG tablet Take 1 tablet (50 mg total) by mouth every 8 (eight) hours as needed. 12/09/20   Marcine MatarJohnson, Deborah B, MD  traZODone (DESYREL) 100 MG tablet Take 100 mg by mouth at bedtime. 11/04/20   [provider]     Allergies    Wheat extract and Ketamine   Review of Systems   Review of Systems A comprehensive review of systems was completed and negative  except as noted in HPI.    Physical Exam BP (!) 161/95   Pulse 98   Temp 97.8 F (36.6 C) (Oral)   Resp (!) 28   Ht 5\' 4"  (1.626 m)   Wt 83 kg   SpO2 99%   BMI 31.41 kg/m   Physical Exam Vitals and nursing note reviewed.  Constitutional:      Appearance: Normal appearance.  HENT:     Head: Normocephalic and atraumatic.     Nose: Nose normal.     Mouth/Throat:     Mouth: Mucous membranes are moist.  Eyes:     Extraocular Movements: Extraocular movements intact.     Conjunctiva/sclera: Conjunctivae normal.  Cardiovascular:     Rate and Rhythm: Normal rate.  Pulmonary:     Effort: Pulmonary effort is normal.     Breath sounds: Normal breath sounds.  Abdominal:     General: Abdomen is flat.     Palpations: Abdomen is soft.     Tenderness: There is abdominal tenderness in the right upper quadrant. There is no guarding. Negative signs include Murphy's sign and McBurney's sign.  Musculoskeletal:        General: No swelling. Normal range of motion.     Cervical back: Neck supple.  Skin:    General: Skin is warm and dry.  Neurological:     General: No focal deficit present.     Mental  Status: She is alert.  Psychiatric:        Mood and Affect: Mood normal.      ED Results / Procedures / Treatments   Labs (all labs ordered are listed, but only abnormal results are displayed) Labs Reviewed - No data to display  EKG None  Radiology CT Angio Chest PE W and/or Wo Contrast  Result Date: 12/14/2020 CLINICAL DATA:  Upper mid right sharp pain of the abdomen. Suspected pulmonary embolus. EXAM: CT ANGIOGRAPHY CHEST CT ABDOMEN AND PELVIS WITH CONTRAST TECHNIQUE: Multidetector CT imaging of the chest was performed using the standard protocol during bolus administration of intravenous contrast. Multiplanar CT image reconstructions and MIPs were obtained to evaluate the vascular anatomy. Multidetector CT imaging of the abdomen and pelvis was performed using the standard protocol during bolus administration of intravenous contrast. CONTRAST:  186mL OMNIPAQUE IOHEXOL 350 MG/ML SOLN COMPARISON:  CT abdomen pelvis 12/09/2020, CT angio chest 11/10/2020, MRI abdomen 04/22/2021, CT abdomen pelvis 04/20/2020 FINDINGS: CTA CHEST FINDINGS Cardiovascular: Satisfactory opacification of the pulmonary arteries to the segmental level. No evidence of pulmonary embolism. The main pulmonary artery is normal in caliber. Normal heart size. No pericardial effusion. Mediastinum/Nodes: No enlarged mediastinal, hilar, or axillary lymph nodes. Thyroid gland, trachea, and esophagus demonstrate no significant findings. Small hiatal hernia. Lungs/Pleura: Bilateral lower lobe subsegmental atelectasis. Linear atelectasis within the lingula. No focal consolidation. Few scattered pulmonary micronodules. Stable 4 mm right lower lobe pulmonary nodule (6:656). No pulmonary mass. No pleural effusion. No pneumothorax. Musculoskeletal: Severe degenerative changes of the right glenohumeral and acromioclavicular joints. No suspicious lytic or blastic osseous lesions. No acute displaced fracture. Multilevel degenerative changes of  the spine. Redemonstration of endplate sclerosis at the T11-T12 level. Review of the MIP images confirms the above findings. CT ABDOMEN and PELVIS FINDINGS Hepatobiliary: Subcentimeter hypodensity within the right hepatic lobe is too small to characterize. No focal liver abnormality. Calcified gallstones within the gallbladder lumen. No gallbladder wall thickening or pericholecystic fluid. No biliary dilatation. Pancreas: Redemonstration of a 4.5 x 3.6 cm proximal cystic pancreatic mass  is partially calcified. The mass appears to be inseparable from the duodenum. No surrounding inflammatory changes. No main pancreatic ductal dilatation. Spleen: Normal in size without focal abnormality. Adrenals/Urinary Tract: No adrenal nodule bilaterally. Bilateral kidneys enhance symmetrically. No hydronephrosis. No hydroureter. Redemonstration of a stable in size 2 cm exophytic right renal lesion with a density of 27 that is best evaluated on MRI abdomen 04/22/2020. Otherwise several simple cystic lesions are again noted. The urinary bladder is unremarkable. Stomach/Bowel: Stomach is within normal limits. No evidence of bowel wall thickening or dilatation. Scattered colonic diverticulosis. Appendix appears normal. Vascular/Lymphatic: The main portal, superior mesenteric, splenic veins are patent. No abdominal aorta or iliac aneurysm. Mild atherosclerotic plaque of the aorta and its branches. No abdominal, pelvic, or inguinal lymphadenopathy. Reproductive: Several uterine fibroids are again noted with largest measuring up to 5.5 cm. Some of these fibroids are coarsely calcified. Otherwise bilateral adnexal regions are unremarkable. Other: No intraperitoneal free fluid. No intraperitoneal free gas. No organized fluid collection. Musculoskeletal: No abdominal wall hernia or abnormality. No suspicious lytic or blastic osseous lesions. No acute displaced fracture. Multilevel degenerative changes of the spine. Redemonstration of grade  1 anterolisthesis of L4 on L5. Review of the MIP images confirms the above findings. IMPRESSION: 1. No pulmonary embolus. 2. No acute intrathoracic abnormality in a patient with stable pulmonary micronodules. 3. No acute intra-abdominal or intrapelvic abnormality in a patient with known proximal pancreatic mass. 4. Other imaging findings of potential clinical significance: Cholelithiasis. Renal cysts that are best evaluated on MR abdomen 04/22/2020. Small hiatal hernia. Scattered colonic diverticulosis with no acute diverticulitis. Uterine fibroids. Aortic Atherosclerosis (ICD10-I70.0). Electronically Signed   By: Iven Finn M.D.   On: 12/14/2020 05:55   CT ABDOMEN PELVIS W CONTRAST  Result Date: 12/14/2020 CLINICAL DATA:  Upper mid right sharp pain of the abdomen. Suspected pulmonary embolus. EXAM: CT ANGIOGRAPHY CHEST CT ABDOMEN AND PELVIS WITH CONTRAST TECHNIQUE: Multidetector CT imaging of the chest was performed using the standard protocol during bolus administration of intravenous contrast. Multiplanar CT image reconstructions and MIPs were obtained to evaluate the vascular anatomy. Multidetector CT imaging of the abdomen and pelvis was performed using the standard protocol during bolus administration of intravenous contrast. CONTRAST:  143mL OMNIPAQUE IOHEXOL 350 MG/ML SOLN COMPARISON:  CT abdomen pelvis 12/09/2020, CT angio chest 11/10/2020, MRI abdomen 04/22/2021, CT abdomen pelvis 04/20/2020 FINDINGS: CTA CHEST FINDINGS Cardiovascular: Satisfactory opacification of the pulmonary arteries to the segmental level. No evidence of pulmonary embolism. The main pulmonary artery is normal in caliber. Normal heart size. No pericardial effusion. Mediastinum/Nodes: No enlarged mediastinal, hilar, or axillary lymph nodes. Thyroid gland, trachea, and esophagus demonstrate no significant findings. Small hiatal hernia. Lungs/Pleura: Bilateral lower lobe subsegmental atelectasis. Linear atelectasis within the  lingula. No focal consolidation. Few scattered pulmonary micronodules. Stable 4 mm right lower lobe pulmonary nodule (6:656). No pulmonary mass. No pleural effusion. No pneumothorax. Musculoskeletal: Severe degenerative changes of the right glenohumeral and acromioclavicular joints. No suspicious lytic or blastic osseous lesions. No acute displaced fracture. Multilevel degenerative changes of the spine. Redemonstration of endplate sclerosis at the T11-T12 level. Review of the MIP images confirms the above findings. CT ABDOMEN and PELVIS FINDINGS Hepatobiliary: Subcentimeter hypodensity within the right hepatic lobe is too small to characterize. No focal liver abnormality. Calcified gallstones within the gallbladder lumen. No gallbladder wall thickening or pericholecystic fluid. No biliary dilatation. Pancreas: Redemonstration of a 4.5 x 3.6 cm proximal cystic pancreatic mass is partially calcified. The mass appears to be  inseparable from the duodenum. No surrounding inflammatory changes. No main pancreatic ductal dilatation. Spleen: Normal in size without focal abnormality. Adrenals/Urinary Tract: No adrenal nodule bilaterally. Bilateral kidneys enhance symmetrically. No hydronephrosis. No hydroureter. Redemonstration of a stable in size 2 cm exophytic right renal lesion with a density of 27 that is best evaluated on MRI abdomen 04/22/2020. Otherwise several simple cystic lesions are again noted. The urinary bladder is unremarkable. Stomach/Bowel: Stomach is within normal limits. No evidence of bowel wall thickening or dilatation. Scattered colonic diverticulosis. Appendix appears normal. Vascular/Lymphatic: The main portal, superior mesenteric, splenic veins are patent. No abdominal aorta or iliac aneurysm. Mild atherosclerotic plaque of the aorta and its branches. No abdominal, pelvic, or inguinal lymphadenopathy. Reproductive: Several uterine fibroids are again noted with largest measuring up to 5.5 cm. Some of  these fibroids are coarsely calcified. Otherwise bilateral adnexal regions are unremarkable. Other: No intraperitoneal free fluid. No intraperitoneal free gas. No organized fluid collection. Musculoskeletal: No abdominal wall hernia or abnormality. No suspicious lytic or blastic osseous lesions. No acute displaced fracture. Multilevel degenerative changes of the spine. Redemonstration of grade 1 anterolisthesis of L4 on L5. Review of the MIP images confirms the above findings. IMPRESSION: 1. No pulmonary embolus. 2. No acute intrathoracic abnormality in a patient with stable pulmonary micronodules. 3. No acute intra-abdominal or intrapelvic abnormality in a patient with known proximal pancreatic mass. 4. Other imaging findings of potential clinical significance: Cholelithiasis. Renal cysts that are best evaluated on MR abdomen 04/22/2020. Small hiatal hernia. Scattered colonic diverticulosis with no acute diverticulitis. Uterine fibroids. Aortic Atherosclerosis (ICD10-I70.0). Electronically Signed   By: Iven Finn M.D.   On: 12/14/2020 05:55    Procedures Procedures  Medications Ordered in the ED Medications - No data to display   MDM Rules/Calculators/A&P MDM During my initial evaluation with the patient, I had been discussing her goals for this ED visit. She wants me to make her pain go away. I was in the process of explaining that complete resolution of her pain would not be possible in an ED visit when I was unfortunately interrupted by a phone call. When I return to the room to continue my discussion the patient was walking out of the ED, states she doesn't want to sit here any longer if we aren't going to fix her problems. I do not have any concerns for an emergent medical condition. Her abdominal exam is benign, no peritoneal signs. She is able to walk with a steady gait and has left the ED without her papers.  ED Course  I have reviewed the triage vital signs and the nursing  notes.  Pertinent labs & imaging results that were available during my care of the patient were reviewed by me and considered in my medical decision making (see chart for details).     Final Clinical Impression(s) / ED Diagnoses Final diagnoses:  Chronic abdominal pain    Rx / DC Orders ED Discharge Orders    None       Truddie Hidden, MD 12/16/20 [phone]

## 2020-12-16 NOTE — Telephone Encounter (Signed)
Spoke with pt. Pt states she has called several times last week due to her pain in her Rt side underneath her ribcage. Pt has been seen three times at the ED, including yesterday and today. Pt states that her pain is at a 9 and as it eases up, she gets a sharp pain. Pt doesn't have much of an appetite. Pt is taking Carafate, Zofran, Phenergan without any help with the nausea. Pt doesn't feel that she is bloated. Pt states no imagine has been done and everyone is focusing on the front of her abdomen, when she is having pain on the rt side. Please advise in the absence of EG.

## 2020-12-16 NOTE — Telephone Encounter (Signed)
See other phone note

## 2020-12-16 NOTE — Telephone Encounter (Signed)
PLEASE CALL PATIENT ABOUT HER ABD PAIN [phone]

## 2020-12-16 NOTE — ED Triage Notes (Signed)
Here for her chronic abdominal pain.

## 2020-12-16 NOTE — Telephone Encounter (Signed)
Will forward to covering provider.

## 2020-12-16 NOTE — Telephone Encounter (Signed)
Pt called stating that she is in pain 9/10 to her right upper abdomen. Pt describes pain as throbbing or stabbing pain. Pt stated that tramadol is not working and she wants something stronger. Pt was crying.  Pt walked out of ED this am stated she was mad that they cannot take away the pain so why should she stay?" Pt was seen at the ED on 4/23/, 4/24 and early this am. Please see ED note for today.  Called office to speak to clinical but not available to take call. Was advised to advise pt that there is a Mobile Medical Unit but pt refused due to the distance.  Was advised to send note to office for PCP to review.  Sent high priority.

## 2020-12-16 NOTE — Telephone Encounter (Signed)
Labs reviewed from ED. All unrevealing. Well-known to [place] [facility].   Needs to be on PPI BID. Continue with lidocaine by mouth every 4-6 hours as needed. Make sure not eating fried foods, fatty foods.   MRI upcoming 5/2. Keep this appt. There is not much we can do as an outpatient, unfortunately.   Known ulcer disease, cholelithiasis.

## 2020-12-16 NOTE — Telephone Encounter (Signed)
Spoke with pt. Pt was notified of Roseanne Kaufman, NP recommendations. Pt isn't eating any fatty/fried foods. Pt will await MRI.

## 2020-12-16 NOTE — Telephone Encounter (Signed)
See encounter

## 2020-12-16 NOTE — ED Notes (Addendum)
Pt left ambulatory, stated "I'm going home". Dr. Karle Starch aware.

## 2020-12-17 ENCOUNTER — Encounter (HOSPITAL_COMMUNITY): Payer: Self-pay | Admitting: Physical Therapy

## 2020-12-17 ENCOUNTER — Ambulatory Visit (HOSPITAL_COMMUNITY): Payer: Medicaid Other | Admitting: Physical Therapy

## 2020-12-17 DIAGNOSIS — M25561 Pain in right knee: Secondary | ICD-10-CM | POA: Diagnosis not present

## 2020-12-17 DIAGNOSIS — M6281 Muscle weakness (generalized): Secondary | ICD-10-CM

## 2020-12-17 DIAGNOSIS — M25562 Pain in left knee: Secondary | ICD-10-CM

## 2020-12-17 DIAGNOSIS — R2689 Other abnormalities of gait and mobility: Secondary | ICD-10-CM

## 2020-12-17 DIAGNOSIS — M25662 Stiffness of left knee, not elsewhere classified: Secondary | ICD-10-CM

## 2020-12-17 DIAGNOSIS — M25661 Stiffness of right knee, not elsewhere classified: Secondary | ICD-10-CM

## 2020-12-17 MED ORDER — TIZANIDINE HCL 4 MG PO TABS: 4.0000 mg | ORAL_TABLET | Freq: Three times a day (TID) | ORAL | 0 refills | Status: DC | PRN

## 2020-12-17 NOTE — Addendum Note (Signed)
Addended by: Charlott Rakes on: 12/17/2020 10:03 AM   Modules accepted: Orders
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
## 2020-12-17 NOTE — Telephone Encounter (Signed)
Contacted pt and made aware. Pt doesn't have any questions or concerns  

## 2020-12-17 NOTE — Telephone Encounter (Signed)
Noted, agree and no further recommendations at this time.   Thanks for the help while out of office.

## 2020-12-17 NOTE — Telephone Encounter (Signed)
I have sent a prescription to her pharmacy 

## 2020-12-17 NOTE — Telephone Encounter (Signed)
Noted  

## 2020-12-18 ENCOUNTER — Ambulatory Visit (HOSPITAL_COMMUNITY): Payer: Medicaid Other | Admitting: Physical Therapy

## 2020-12-18 ENCOUNTER — Other Ambulatory Visit: Payer: Self-pay

## 2020-12-18 ENCOUNTER — Encounter (HOSPITAL_COMMUNITY): Payer: Self-pay | Admitting: Physical Therapy

## 2020-12-18 ENCOUNTER — Other Ambulatory Visit: Payer: Self-pay | Admitting: Internal Medicine

## 2020-12-18 DIAGNOSIS — M25562 Pain in left knee: Secondary | ICD-10-CM

## 2020-12-18 DIAGNOSIS — M25661 Stiffness of right knee, not elsewhere classified: Secondary | ICD-10-CM

## 2020-12-18 DIAGNOSIS — R2689 Other abnormalities of gait and mobility: Secondary | ICD-10-CM

## 2020-12-18 DIAGNOSIS — M6281 Muscle weakness (generalized): Secondary | ICD-10-CM

## 2020-12-18 DIAGNOSIS — M25662 Stiffness of left knee, not elsewhere classified: Secondary | ICD-10-CM

## 2020-12-18 DIAGNOSIS — M25561 Pain in right knee: Secondary | ICD-10-CM | POA: Diagnosis not present

## 2020-12-18 NOTE — Telephone Encounter (Signed)
Copied from [place] [id number]. Topic: Quick Communication - Rx Refill/Question >> Dec 18, 2020  8:37 AM Tessa Lerner A wrote: Medication: tiZANidine (ZANAFLEX) 4 MG tablet   Has the patient contacted their pharmacy? Patient has contacted their pharmacy and been told that no prescription has been submitted for them. Patient was encouraged by pharmacy to contact their PCP  Preferred Pharmacy (with phone number or street name): [place], [place] - [id number] [place]  Phone:  [phone] Fax:  [phone]  Agent: Please be advised that RX refills may take up to 3 business days. We ask that you follow-up with your pharmacy.
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
## 2020-12-18 NOTE — Telephone Encounter (Signed)
Requested medication (s) are due for refill today: yes  Requested medication (s) are on the active medication list:  yes  Last refill:  12/17/20   Future visit scheduled: yes  Notes to clinic:  not delegated    Requested Prescriptions  Pending Prescriptions Disp Refills   tiZANidine (ZANAFLEX) 4 MG tablet 30 tablet 0    Sig: Take 1 tablet (4 mg total) by mouth every 8 (eight) hours as needed for muscle spasms.      Not Delegated - Cardiovascular:  Alpha-2 Agonists - tizanidine Failed - 12/18/2020  8:42 AM      Failed - This refill cannot be delegated      Passed - Valid encounter within last 6 months    Recent Outpatient Visits           1 week ago Hospital discharge follow-up   [place], MD   1 month ago Hospital discharge follow-up   [place], MD   2 months ago Hospital discharge follow-up   [place], Deborah B, MD   4 months ago Severe episode of recurrent major depressive disorder, without psychotic features [facility])   [place] Elsie Stain, MD   4 months ago Hospital discharge follow-up   [place], MD       Future Appointments             In 4 weeks Ralene Bathe, MD [place]

## 2020-12-18 NOTE — Patient Instructions (Signed)
Access Code: [email or web address] URL: [email or web address] Date: 12/18/2020 Prepared by: Josue Hector  Exercises Tandem Stance with Support - 2-3 x daily - 7 x weekly - 1 sets - 3 reps - 30 seconds hold Eccentric Squat - 2-3 x daily - 7 x weekly - 2 sets - 10 reps

## 2020-12-19 ENCOUNTER — Ambulatory Visit (HOSPITAL_COMMUNITY): Payer: Medicaid Other | Admitting: Physical Therapy

## 2020-12-19 ENCOUNTER — Telehealth: Payer: Self-pay | Admitting: Internal Medicine

## 2020-12-19 MED ORDER — TIZANIDINE HCL 4 MG PO TABS: 4.0000 mg | ORAL_TABLET | Freq: Three times a day (TID) | ORAL | 0 refills | Status: DC | PRN

## 2020-12-19 NOTE — Telephone Encounter (Signed)
Copied from [place] [id number]. Topic: Quick Communication - Rx Refill/Question >> Dec 18, 2020  8:37 AM Tessa Lerner A wrote: Medication: tiZANidine (ZANAFLEX) 4 MG tablet   Has the patient contacted their pharmacy? Patient has contacted their pharmacy and been told that no prescription has been submitted for them. Patient was encouraged by pharmacy to contact their PCP  Preferred Pharmacy (with phone number or street name): [place], [place] - [id number] [place] [address]  Phone:  [phone] Fax:  [phone]  Agent: Please be advised that RX refills may take up to 3 business days. We ask that you follow-up with your pharmacy. >> Dec 19, 2020  1:35 PM Mcneil, Jacinto Reap wrote: Pt calling for an update on the Rx refill request. Pt stated she has been contacting the pharmacy but they have not received the Rx.

## 2020-12-19 NOTE — Telephone Encounter (Signed)
Rx was sent to today

## 2020-12-20 ENCOUNTER — Other Ambulatory Visit: Payer: Self-pay

## 2020-12-20 ENCOUNTER — Inpatient Hospital Stay (HOSPITAL_COMMUNITY): Payer: Medicaid Other

## 2020-12-20 ENCOUNTER — Encounter (HOSPITAL_COMMUNITY): Payer: Self-pay | Admitting: Hematology and Oncology

## 2020-12-20 ENCOUNTER — Inpatient Hospital Stay (HOSPITAL_COMMUNITY): Payer: Medicaid Other | Attending: Oncology | Admitting: Hematology and Oncology

## 2020-12-20 VITALS — BP 100/69 | HR 93 | Temp 98.2°F | Resp 17 | Wt 174.4 lb

## 2020-12-20 DIAGNOSIS — R5383 Other fatigue: Secondary | ICD-10-CM | POA: Insufficient documentation

## 2020-12-20 DIAGNOSIS — D649 Anemia, unspecified: Secondary | ICD-10-CM

## 2020-12-20 DIAGNOSIS — Z800 Family history of malignant neoplasm of digestive organs: Secondary | ICD-10-CM | POA: Diagnosis not present

## 2020-12-20 DIAGNOSIS — R531 Weakness: Secondary | ICD-10-CM | POA: Diagnosis not present

## 2020-12-20 DIAGNOSIS — I2699 Other pulmonary embolism without acute cor pulmonale: Secondary | ICD-10-CM

## 2020-12-20 DIAGNOSIS — K5900 Constipation, unspecified: Secondary | ICD-10-CM | POA: Insufficient documentation

## 2020-12-20 DIAGNOSIS — Z8711 Personal history of peptic ulcer disease: Secondary | ICD-10-CM | POA: Insufficient documentation

## 2020-12-20 DIAGNOSIS — E876 Hypokalemia: Secondary | ICD-10-CM | POA: Diagnosis not present

## 2020-12-20 DIAGNOSIS — Z86711 Personal history of pulmonary embolism: Secondary | ICD-10-CM | POA: Diagnosis present

## 2020-12-20 LAB — CBC WITH DIFFERENTIAL/PLATELET
Abs Immature Granulocytes: 0.01 10*3/uL (ref 0.00–0.07)
Basophils Absolute: 0 10*3/uL (ref 0.0–0.1)
Basophils Relative: 1 %
Eosinophils Absolute: 0.1 10*3/uL (ref 0.0–0.5)
Eosinophils Relative: 2 %
HCT: 33.8 % — ABNORMAL LOW (ref 36.0–46.0)
Hemoglobin: 10.6 g/dL — ABNORMAL LOW (ref 12.0–15.0)
Immature Granulocytes: 0 %
Lymphocytes Relative: 19 %
Lymphs Abs: 1 10*3/uL (ref 0.7–4.0)
MCH: 26.9 pg (ref 26.0–34.0)
MCHC: 31.4 g/dL (ref 30.0–36.0)
MCV: 85.8 fL (ref 80.0–100.0)
Monocytes Absolute: 0.4 10*3/uL (ref 0.1–1.0)
Monocytes Relative: 8 %
Neutro Abs: 3.7 10*3/uL (ref 1.7–7.7)
Neutrophils Relative %: 70 %
Platelets: 237 10*3/uL (ref 150–400)
RBC: 3.94 MIL/uL (ref 3.87–5.11)
RDW: 15.1 % (ref 11.5–15.5)
WBC: 5.3 10*3/uL (ref 4.0–10.5)
nRBC: 0 % (ref 0.0–0.2)

## 2020-12-20 LAB — COMPREHENSIVE METABOLIC PANEL
ALT: 8 U/L (ref 0–44)
AST: 17 U/L (ref 15–41)
Albumin: 4.2 g/dL (ref 3.5–5.0)
Alkaline Phosphatase: 56 U/L (ref 38–126)
Anion gap: 8 (ref 5–15)
BUN: 24 mg/dL — ABNORMAL HIGH (ref 8–23)
CO2: 24 mmol/L (ref 22–32)
Calcium: 9.4 mg/dL (ref 8.9–10.3)
Chloride: 105 mmol/L (ref 98–111)
Creatinine, Ser: 0.84 mg/dL (ref 0.44–1.00)
GFR, Estimated: >60 mL/min (ref >60)
Glucose, Bld: 99 mg/dL (ref 70–99)
Potassium: 3.9 mmol/L (ref 3.5–5.1)
Sodium: 137 mmol/L (ref 135–145)
Total Bilirubin: 0.5 mg/dL (ref 0.3–1.2)
Total Protein: 7.6 g/dL (ref 6.5–8.1)

## 2020-12-20 LAB — IRON AND TIBC
Iron: 32 ug/dL (ref 28–170)
Saturation Ratios: 6 % — ABNORMAL LOW (ref 10.4–31.8)
TIBC: 544 ug/dL — ABNORMAL HIGH (ref 250–450)
UIBC: 512 ug/dL

## 2020-12-20 LAB — RETICULOCYTES
Immature Retic Fract: 17.3 % — ABNORMAL HIGH (ref 2.3–15.9)
RBC.: 3.94 MIL/uL (ref 3.87–5.11)
Retic Count, Absolute: 41.4 10*3/uL (ref 19.0–186.0)
Retic Ct Pct: 1.1 % (ref 0.4–3.1)

## 2020-12-20 LAB — FERRITIN: Ferritin: 5 ng/mL — ABNORMAL LOW (ref 11–307)

## 2020-12-20 LAB — LACTATE DEHYDROGENASE: LDH: 129 U/L (ref 98–192)

## 2020-12-20 LAB — VITAMIN B12: Vitamin B-12: 286 pg/mL (ref 180–914)

## 2020-12-20 NOTE — Progress Notes (Signed)
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 and pulmonary vascularity are normal. Streaky perihilar opacities with peribronchial thickening suggest multifocal pneumonia versus airways disease. No focal consolidation. No pleural effusions. No pneumothorax. Mediastinal contours appear intact. Degenerative changes in the spine and shoulders. Scattered gas and stool in the colon. No small or large bowel distention. No free intra-abdominal air. No abnormal air-fluid levels. No radiopaque stones. Calcified phleboliths in the pelvis. Degenerative changes in the spine and hips. IMPRESSION: Streaky perihilar opacities with peribronchial thickening suggest multifocal pneumonia versus airways disease. Nonobstructive bowel gas pattern. Electronically Signed   By: Lucienne Capers M.D.   On: 12/14/2020 02:14   MYOCARDIAL PERFUSION IMAGING  Result Date: 12/12/2020  The left ventricular ejection fraction is normal (55-65%).  Nuclear stress EF: 60%.  There was no ST segment deviation noted during stress.  The study is normal.  This is a low risk study.      Assessment and plan- Patient is a 65 y.o. female who has been referred to [place] for following issues:  1.  Bilateral PE with right heart strain in September 2021: Patient was on Xarelto for Close to 6 months.  She then had some GI bleed, hence anticoagulation was held and hypercoagulable work up was ordered. She is here establishing with [place] for the first time. Since this is the only episode of provoked PE although it was high burden PE causing right heart strain, since she has GI ulcers that intermittently bleed, I dont believe she will benefit from anticoagulation. She had hypercoag work up which was neg. Most recent CT with PE protocol negative. I explained to her in detail If she has another episode of DVT/PE down the lane, then she can re consult [place] to discuss recommendations.  2.  Normocytic anemia:  Ordered work up including CBC, CMP, iron panel, ferritin, B12, folate, hemolysis labs, SPEP, K/L ratio Her Hb is stable  at this time compared to 4 weeks ago She has no clinical symptoms concerning for GI bleed.  3.  Pancreatic cystic mass: Last CA 19-9 normal She follows up with Dr Charlynne Cousins, we dont have any information that she has pancreatic cancer at this time. However since her mom had pancreatic cancer, I recommend genetics referral Most recent imaging with no definitive pancreatic lesions per report.  RTC in 8 weeks.  Benay Pike MD

## 2020-12-21 LAB — FOLATE RBC
Folate, Hemolysate: 287 ng/mL
Folate, RBC: 852 ng/mL (ref >498)
Hematocrit: 33.7 % — ABNORMAL LOW (ref 34.0–46.6)

## 2020-12-21 NOTE — ED Triage Notes (Signed)
Pt brought in by EMS for c/o abdominal pain x 6 hrs.

## 2020-12-22 ENCOUNTER — Other Ambulatory Visit: Payer: Self-pay

## 2020-12-22 ENCOUNTER — Emergency Department (HOSPITAL_COMMUNITY)
Admission: EM | Admit: 2020-12-22 | Discharge: 2020-12-22 | Disposition: A | Discharge status: Home / Self Care | Payer: Medicaid Other | Attending: Emergency Medicine | Admitting: Emergency Medicine

## 2020-12-22 ENCOUNTER — Encounter (HOSPITAL_COMMUNITY): Payer: Self-pay | Admitting: Emergency Medicine

## 2020-12-22 DIAGNOSIS — R1011 Right upper quadrant pain: Secondary | ICD-10-CM | POA: Diagnosis not present

## 2020-12-22 DIAGNOSIS — K219 Gastro-esophageal reflux disease without esophagitis: Secondary | ICD-10-CM | POA: Diagnosis not present

## 2020-12-22 DIAGNOSIS — R110 Nausea: Secondary | ICD-10-CM | POA: Insufficient documentation

## 2020-12-22 DIAGNOSIS — Z853 Personal history of malignant neoplasm of breast: Secondary | ICD-10-CM | POA: Diagnosis not present

## 2020-12-22 DIAGNOSIS — R1013 Epigastric pain: Secondary | ICD-10-CM | POA: Diagnosis present

## 2020-12-22 DIAGNOSIS — I10 Essential (primary) hypertension: Secondary | ICD-10-CM | POA: Diagnosis not present

## 2020-12-22 MED ORDER — HALOPERIDOL LACTATE 5 MG/ML IJ SOLN
5.0000 mg | Freq: Once | INTRAMUSCULAR | Status: AC
  Administered 2020-12-22: 5 mg via INTRAMUSCULAR
  Filled 2020-12-22: qty 1

## 2020-12-22 MED ORDER — OXYCODONE HCL 5 MG PO TABS
5.0000 mg | ORAL_TABLET | Freq: Once | ORAL | Status: AC
  Administered 2020-12-22: 5 mg via ORAL
  Filled 2020-12-22: qty 1

## 2020-12-22 NOTE — ED Notes (Signed)
Pt on cell phone- no vomiting since arrival.

## 2020-12-22 NOTE — ED Notes (Signed)
Pt given diet sprite for PO challenge.  

## 2020-12-22 NOTE — Discharge Instructions (Addendum)
You were evaluated in the Emergency Department and after careful evaluation, we did not find any emergent condition requiring admission or further testing in the hospital.  Your exam/testing today is overall reassuring.  Please return to the Emergency Department if you experience any worsening of your condition.   Thank you for allowing us to be a part of your care. 

## 2020-12-22 NOTE — ED Provider Notes (Signed)
[facility] Emergency Department Provider Note MRN:  [id number]  Arrival date & time: 12/22/20     Chief Complaint   Abdominal Pain   History of Present Illness   Maria Murphy is a 65 y.o. year-old female with a history of collagen vascular disease, peptic ulcer disease, hypertension presenting to the ED with chief complaint of abdominal pain.  Location: Epigastric Duration: 6 hours Onset: Gradual Timing: Const Description: Dull Severity: Moderate to severe Exacerbating/Alleviating Factors: None Associated Symptoms: Nausea Pertinent Negatives: Denies fever, no chest pain.    Review of Systems  A complete 10 system review of systems was obtained and all systems are negative except as noted in the HPI and PMH.   Patient's Health History    Past Medical History:  Diagnosis Date  . Collagen vascular disease ([place])   . Essential hypertension, benign 01/24/2015  . Foot ulcer ([place])   . GERD (gastroesophageal reflux disease)   . Hypertension   . Memory loss   . PUD (peptic ulcer disease)    with perforation s/p exploratory laparotomy  . Pulmonary embolus ([place]) 04/2020  . Skin cancer   . Vision abnormalities     Past Surgical History:  Procedure Laterality Date  . BIOPSY  11/20/2020   Procedure: BIOPSY;  Surgeon: Rush Landmark Telford Nab., MD;  Location: [facility];  Service: Gastroenterology;;  . CENTRAL LINE INSERTION Right 04/22/2020   Procedure: CENTRAL LINE INSERTION;  Surgeon: Virl Cagey, MD;  [facility];  Service: General;  Laterality: Right;  . DIAGNOSTIC LAPAROSCOPY    . ESOPHAGOGASTRODUODENOSCOPY N/A 11/20/2020   Procedure: ESOPHAGOGASTRODUODENOSCOPY (EGD);  Surgeon: Irving Copas., MD;  Location: [facility];  Service: Gastroenterology;  Laterality: N/A;  . ESOPHAGOGASTRODUODENOSCOPY (EGD) WITH PROPOFOL N/A 07/10/2020   Procedure: ESOPHAGOGASTRODUODENOSCOPY (EGD) WITH PROPOFOL;  Surgeon: Rogene Houston, MD;   [facility];  Service: Endoscopy;  Laterality: N/A;  . ESOPHAGOGASTRODUODENOSCOPY (EGD) WITH PROPOFOL N/A 10/18/2020   Procedure: ESOPHAGOGASTRODUODENOSCOPY (EGD) WITH PROPOFOL;  Surgeon: Harvel Quale, MD;  [facility];  Service: Gastroenterology;  Laterality: N/A;  . EUS N/A 11/20/2020   Procedure: UPPER ENDOSCOPIC ULTRASOUND (EUS) LINEAR;  Surgeon: Irving Copas., MD;  [facility];  Service: Gastroenterology;  Laterality: N/A;  . GASTRORRHAPHY  04/22/2020   Procedure: GASTRORRHAPHY;  Surgeon: Virl Cagey, MD;  Location[facility];  Service: General;;  . IR CATHETER TUBE CHANGE  05/09/2020  . LAPAROTOMY N/A 04/22/2020   Procedure: EXPLORATORY LAPAROTOMY;  Surgeon: Virl Cagey, MD;  Location: [facility];  Service: General;  Laterality: N/A;  . TONSILLECTOMY      Family History  Problem Relation Age of Onset  . High Cholesterol Mother   . Hypertension Mother   . Arthritis/Rheumatoid Mother   . Alcohol abuse Father   . Hypertension Maternal Grandmother   . Colon cancer Neg Hx   . Colon polyps Neg Hx   . Inflammatory bowel disease Neg Hx     Social History   Socioeconomic History  . Marital status: Widowed    Spouse name: Not on file  . Number of children: 2  . Years of education: Not on file  . Highest education level: Not on file  Occupational History  . Not on file  Tobacco Use  . Smoking status: Never Smoker  . Smokeless tobacco: Never Used  Vaping Use  . Vaping Use: Never used  Substance and Sexual Activity  . Alcohol use: Yes    Alcohol/week:  2.0 - 3.0 standard drinks    Types: 2 - 3 Glasses of wine per week  . Drug use: No  . Sexual activity: Not Currently    Birth control/protection: Post-menopausal  Other Topics Concern  . Not on file  Social History Narrative  . Not on file   Social Determinants of Health   Financial Resource Strain: Not on file  Food Insecurity: Not on file  Transportation Needs:  No Transportation Needs  . Lack of Transportation (Medical): No  . Lack of Transportation (Non-Medical): No  Physical Activity: Inactive  . Days of Exercise per Week: 0 days  . Minutes of Exercise per Session: 0 min  Stress: Not on file  Social Connections: Not on file  Intimate Partner Violence: Not At Risk  . Fear of Current or Ex-Partner: No  . Emotionally Abused: No  . Physically Abused: No  . Sexually Abused: No     Physical Exam   Vitals:   12/22/20 0100 12/22/20 0200  BP: 130/88 (!) 153/97  Pulse: 72 94  Resp: 18 20  Temp:    SpO2: 100% 100%    CONSTITUTIONAL: Well-appearing, in moderate distress due to discomfort NEURO:  Alert and oriented x 3, no focal deficits EYES:  eyes equal and reactive ENT/NECK:  no LAD, no JVD CARDIO: Tachycardic rate, well-perfused, normal S1 and S2 PULM:  CTAB no wheezing or rhonchi GI/GU:  normal bowel sounds, non-distended, non-tender MSK/SPINE:  No gross deformities, no edema SKIN:  no rash, atraumatic PSYCH:  Appropriate speech and behavior  *Additional and/or pertinent findings included in MDM below  Diagnostic and Interventional Summary    EKG Interpretation  Date/Time:    Ventricular Rate:    PR Interval:    QRS Duration:   QT Interval:    QTC Calculation:   R Axis:     Text Interpretation:        Labs Reviewed - No data to display  No orders to display    Medications  haloperidol lactate (HALDOL) injection 5 mg (5 mg Intramuscular Given 12/22/20 0034)  oxyCODONE (Oxy IR/ROXICODONE) immediate release tablet 5 mg (5 mg Oral Given 12/22/20 0157)     Procedures  /  Critical Care Procedures  ED Course and Medical Decision Making  I have reviewed the triage vital signs, the nursing notes, and pertinent available records from the EMR.  Listed above are laboratory and imaging tests that I personally ordered, reviewed, and interpreted and then considered in my medical decision making (see below for details).  Very  frequent ED visits recently, chronic abdominal pain seems to be related to peptic ulcer disease or possibly biliary colic.  Providing dose of Haldol for symptomatic management and will reassess.  Had labs and CT scan performed recently which were reassuring     Patient's symptoms largely resolved after Haldol.  Abdomen is soft and nontender.  Upon reviewing the chart, this is a chronic issue for her, right upper quadrant pain.  With normal vital signs at this time and a soft abdomen, there is no indication for further testing or admission, appropriate for discharge.  Has GI follow-up.  Barth Kirks. Sedonia Small, [place] [email or web address]  Final Clinical Impressions(s) / ED Diagnoses     ICD-10-CM   1. Right upper quadrant abdominal pain  R10.11     ED Discharge Orders    None       Discharge Instructions Discussed with and Provided to Patient:  Discharge Instructions     You were evaluated in the Emergency Department and after careful evaluation, we did not find any emergent condition requiring admission or further testing in the hospital.  Your exam/testing today is overall reassuring.  Please return to the Emergency Department if you experience any worsening of your condition.   Thank you for allowing [place] to be a part of your care.       Maudie Flakes, MD 12/22/20 [phone]

## 2020-12-22 NOTE — ED Notes (Signed)
Pt requesting pain meds- Dr Sedonia Small to speak with pt

## 2020-12-23 ENCOUNTER — Other Ambulatory Visit: Payer: Self-pay | Admitting: General Surgery

## 2020-12-23 ENCOUNTER — Ambulatory Visit (HOSPITAL_COMMUNITY)
Admission: RE | Admit: 2020-12-23 | Discharge: 2020-12-23 | Disposition: A | Discharge status: Home / Self Care | Payer: Medicaid Other | Source: Ambulatory Visit | Attending: General Surgery | Admitting: General Surgery

## 2020-12-23 DIAGNOSIS — K862 Cyst of pancreas: Secondary | ICD-10-CM

## 2020-12-23 LAB — PROTEIN ELECTROPHORESIS, SERUM
A/G Ratio: 1.2 (ref 0.7–1.7)
Albumin ELP: 3.8 g/dL (ref 2.9–4.4)
Alpha-1-Globulin: 0.3 g/dL (ref 0.0–0.4)
Alpha-2-Globulin: 0.9 g/dL (ref 0.4–1.0)
Beta Globulin: 1.1 g/dL (ref 0.7–1.3)
Gamma Globulin: 1.1 g/dL (ref 0.4–1.8)
Globulin, Total: 3.3 g/dL (ref 2.2–3.9)
Total Protein ELP: 7.1 g/dL (ref 6.0–8.5)

## 2020-12-23 LAB — KAPPA/LAMBDA LIGHT CHAINS
Kappa free light chain: 18.2 mg/L (ref 3.3–19.4)
Kappa, lambda light chain ratio: 1.63 (ref 0.26–1.65)
Lambda free light chains: 11.2 mg/L (ref 5.7–26.3)

## 2020-12-23 MED ORDER — GADOBUTROL 1 MMOL/ML IV SOLN
7.0000 mL | Freq: Once | INTRAVENOUS | Status: AC | PRN
  Administered 2020-12-23: 7 mL via INTRAVENOUS

## 2020-12-24 ENCOUNTER — Encounter (HOSPITAL_COMMUNITY): Payer: Self-pay

## 2020-12-24 ENCOUNTER — Other Ambulatory Visit: Payer: Self-pay

## 2020-12-24 ENCOUNTER — Ambulatory Visit (HOSPITAL_COMMUNITY): Payer: Medicaid Other | Attending: Family Medicine

## 2020-12-24 ENCOUNTER — Telehealth: Payer: Self-pay | Admitting: Hematology and Oncology

## 2020-12-24 DIAGNOSIS — M25661 Stiffness of right knee, not elsewhere classified: Secondary | ICD-10-CM | POA: Diagnosis present

## 2020-12-24 DIAGNOSIS — M25561 Pain in right knee: Secondary | ICD-10-CM | POA: Insufficient documentation

## 2020-12-24 DIAGNOSIS — M25662 Stiffness of left knee, not elsewhere classified: Secondary | ICD-10-CM | POA: Insufficient documentation

## 2020-12-24 DIAGNOSIS — M25562 Pain in left knee: Secondary | ICD-10-CM | POA: Insufficient documentation

## 2020-12-24 DIAGNOSIS — R2689 Other abnormalities of gait and mobility: Secondary | ICD-10-CM | POA: Diagnosis present

## 2020-12-24 DIAGNOSIS — M6281 Muscle weakness (generalized): Secondary | ICD-10-CM | POA: Insufficient documentation

## 2020-12-24 NOTE — Telephone Encounter (Signed)
Scheduled appt per 4/29 sch msg. Pt aware.  
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
## 2020-12-25 ENCOUNTER — Other Ambulatory Visit: Payer: Self-pay | Admitting: Hematology and Oncology

## 2020-12-25 DIAGNOSIS — D500 Iron deficiency anemia secondary to blood loss (chronic): Secondary | ICD-10-CM

## 2020-12-25 DIAGNOSIS — D509 Iron deficiency anemia, unspecified: Secondary | ICD-10-CM | POA: Insufficient documentation

## 2020-12-26 ENCOUNTER — Encounter (HOSPITAL_COMMUNITY): Payer: Self-pay

## 2020-12-26 ENCOUNTER — Ambulatory Visit (HOSPITAL_COMMUNITY): Payer: Medicaid Other

## 2020-12-26 ENCOUNTER — Other Ambulatory Visit: Payer: Self-pay

## 2020-12-26 DIAGNOSIS — M25562 Pain in left knee: Secondary | ICD-10-CM

## 2020-12-26 DIAGNOSIS — M6281 Muscle weakness (generalized): Secondary | ICD-10-CM

## 2020-12-26 DIAGNOSIS — M25661 Stiffness of right knee, not elsewhere classified: Secondary | ICD-10-CM

## 2020-12-26 DIAGNOSIS — M25561 Pain in right knee: Secondary | ICD-10-CM

## 2020-12-26 DIAGNOSIS — M25662 Stiffness of left knee, not elsewhere classified: Secondary | ICD-10-CM

## 2020-12-26 DIAGNOSIS — R2689 Other abnormalities of gait and mobility: Secondary | ICD-10-CM
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
## 2020-12-27 ENCOUNTER — Encounter: Payer: Medicaid Other | Admitting: Internal Medicine

## 2020-12-27 ENCOUNTER — Encounter: Payer: Self-pay | Admitting: Internal Medicine

## 2020-12-29 ENCOUNTER — Other Ambulatory Visit: Payer: Self-pay

## 2020-12-29 ENCOUNTER — Emergency Department (HOSPITAL_COMMUNITY)
Admission: EM | Admit: 2020-12-29 | Discharge: 2020-12-29 | Disposition: A | Discharge status: Home / Self Care | Payer: Medicaid Other | Attending: Emergency Medicine | Admitting: Emergency Medicine

## 2020-12-29 ENCOUNTER — Encounter (HOSPITAL_COMMUNITY): Payer: Self-pay | Admitting: *Deleted

## 2020-12-29 DIAGNOSIS — R112 Nausea with vomiting, unspecified: Secondary | ICD-10-CM | POA: Diagnosis not present

## 2020-12-29 DIAGNOSIS — I10 Essential (primary) hypertension: Secondary | ICD-10-CM | POA: Insufficient documentation

## 2020-12-29 DIAGNOSIS — R5383 Other fatigue: Secondary | ICD-10-CM | POA: Diagnosis not present

## 2020-12-29 DIAGNOSIS — Z85828 Personal history of other malignant neoplasm of skin: Secondary | ICD-10-CM | POA: Insufficient documentation

## 2020-12-29 DIAGNOSIS — R82998 Other abnormal findings in urine: Secondary | ICD-10-CM | POA: Insufficient documentation

## 2020-12-29 DIAGNOSIS — R42 Dizziness and giddiness: Secondary | ICD-10-CM | POA: Insufficient documentation

## 2020-12-29 DIAGNOSIS — R197 Diarrhea, unspecified: Secondary | ICD-10-CM | POA: Diagnosis not present

## 2020-12-29 DIAGNOSIS — Z79899 Other long term (current) drug therapy: Secondary | ICD-10-CM | POA: Diagnosis not present

## 2020-12-29 DIAGNOSIS — R531 Weakness: Secondary | ICD-10-CM | POA: Diagnosis present

## 2020-12-29 LAB — COMPREHENSIVE METABOLIC PANEL
ALT: 9 U/L (ref 0–44)
AST: 16 U/L (ref 15–41)
Albumin: 4.1 g/dL (ref 3.5–5.0)
Alkaline Phosphatase: 55 U/L (ref 38–126)
Anion gap: 10 (ref 5–15)
BUN: 17 mg/dL (ref 8–23)
CO2: 22 mmol/L (ref 22–32)
Calcium: 8.9 mg/dL (ref 8.9–10.3)
Chloride: 109 mmol/L (ref 98–111)
Creatinine, Ser: 0.77 mg/dL (ref 0.44–1.00)
GFR, Estimated: >60 mL/min (ref >60)
Glucose, Bld: 103 mg/dL — ABNORMAL HIGH (ref 70–99)
Potassium: 3.8 mmol/L (ref 3.5–5.1)
Sodium: 141 mmol/L (ref 135–145)
Total Bilirubin: 0.5 mg/dL (ref 0.3–1.2)
Total Protein: 7 g/dL (ref 6.5–8.1)

## 2020-12-29 LAB — CBC
HCT: 32.5 % — ABNORMAL LOW (ref 36.0–46.0)
Hemoglobin: 10 g/dL — ABNORMAL LOW (ref 12.0–15.0)
MCH: 26.5 pg (ref 26.0–34.0)
MCHC: 30.8 g/dL (ref 30.0–36.0)
MCV: 86 fL (ref 80.0–100.0)
Platelets: 193 10*3/uL (ref 150–400)
RBC: 3.78 MIL/uL — ABNORMAL LOW (ref 3.87–5.11)
RDW: 14.8 % (ref 11.5–15.5)
WBC: 8 10*3/uL (ref 4.0–10.5)
nRBC: 0 % (ref 0.0–0.2)

## 2020-12-29 LAB — URINALYSIS, ROUTINE W REFLEX MICROSCOPIC
Bacteria, UA: NONE SEEN
Bilirubin Urine: NEGATIVE
Glucose, UA: NEGATIVE mg/dL
Hgb urine dipstick: NEGATIVE
Ketones, ur: NEGATIVE mg/dL
Nitrite: NEGATIVE
Protein, ur: NEGATIVE mg/dL
Specific Gravity, Urine: 1.01 (ref 1.005–1.030)
pH: 6 (ref 5.0–8.0)

## 2020-12-29 LAB — LIPASE, BLOOD: Lipase: 33 U/L (ref 11–51)

## 2020-12-29 MED ORDER — LACTATED RINGERS IV BOLUS
1000.0000 mL | Freq: Once | INTRAVENOUS | Status: AC
  Administered 2020-12-29: 1000 mL via INTRAVENOUS

## 2020-12-29 MED ORDER — MORPHINE SULFATE (PF) 4 MG/ML IV SOLN
4.0000 mg | Freq: Once | INTRAVENOUS | Status: AC
  Administered 2020-12-29: 4 mg via INTRAVENOUS
  Filled 2020-12-29: qty 1

## 2020-12-29 NOTE — ED Triage Notes (Signed)
Pt brought in by [facility] with c/o diarrhea x 2 days, vomiting today, decreased urine and dark in color.
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
## 2020-12-29 NOTE — ED Notes (Signed)
Pt reports she has intermittent suicidal thoughts, mainly when she is in pain. Pt states, "I'm just tired of being in pain all the time. The thoughts go away when the pain gets better." No plan when she does have the thoughts. Dr. Rogene Houston made aware.

## 2020-12-29 NOTE — Discharge Instructions (Signed)
You were seen in the ER today for your weakness.  Your physical exam, vital signs, blood work, and urine test were reassuring.  You were feeling much improved after administration of IV fluids.  Return to the emergency department develop any new or severe symptoms.

## 2020-12-30 ENCOUNTER — Ambulatory Visit (HOSPITAL_COMMUNITY): Payer: Medicaid Other | Admitting: Physical Therapy

## 2020-12-31 ENCOUNTER — Inpatient Hospital Stay (HOSPITAL_COMMUNITY): Payer: Medicaid Other | Attending: Oncology

## 2020-12-31 ENCOUNTER — Other Ambulatory Visit: Payer: Self-pay

## 2020-12-31 ENCOUNTER — Encounter (HOSPITAL_COMMUNITY): Payer: Self-pay

## 2020-12-31 VITALS — BP 116/64 | HR 68 | Temp 97.2°F | Resp 17

## 2020-12-31 DIAGNOSIS — Z86711 Personal history of pulmonary embolism: Secondary | ICD-10-CM | POA: Diagnosis present

## 2020-12-31 DIAGNOSIS — D509 Iron deficiency anemia, unspecified: Secondary | ICD-10-CM | POA: Insufficient documentation

## 2020-12-31 DIAGNOSIS — Z79899 Other long term (current) drug therapy: Secondary | ICD-10-CM | POA: Insufficient documentation

## 2020-12-31 DIAGNOSIS — D500 Iron deficiency anemia secondary to blood loss (chronic): Secondary | ICD-10-CM

## 2020-12-31 LAB — URINE CULTURE

## 2020-12-31 MED ORDER — SODIUM CHLORIDE 0.9 % IV SOLN: Freq: Once | INTRAVENOUS | Status: AC

## 2020-12-31 MED ORDER — ACETAMINOPHEN 325 MG PO TABS
650.0000 mg | ORAL_TABLET | Freq: Once | ORAL | Status: AC
Start: 2020-12-31 — End: 2020-12-31
  Administered 2020-12-31: 650 mg via ORAL

## 2020-12-31 MED ORDER — LORATADINE 10 MG PO TABS
10.0000 mg | ORAL_TABLET | Freq: Once | ORAL | Status: AC
Start: 2020-12-31 — End: 2020-12-31
  Administered 2020-12-31: 10 mg via ORAL

## 2020-12-31 MED ORDER — FAMOTIDINE 20 MG PO TABS
ORAL_TABLET | ORAL | Status: AC
  Filled 2020-12-31: qty 1

## 2020-12-31 MED ORDER — ACETAMINOPHEN 325 MG PO TABS
ORAL_TABLET | ORAL | Status: AC
  Filled 2020-12-31: qty 2

## 2020-12-31 MED ORDER — SODIUM CHLORIDE 0.9 % IV SOLN
200.0000 mg | Freq: Once | INTRAVENOUS | Status: AC
  Administered 2020-12-31: 200 mg via INTRAVENOUS
  Filled 2020-12-31: qty 200

## 2020-12-31 MED ORDER — LORATADINE 10 MG PO TABS
ORAL_TABLET | ORAL | Status: AC
  Filled 2020-12-31: qty 1

## 2020-12-31 MED ORDER — FAMOTIDINE 20 MG PO TABS
20.0000 mg | ORAL_TABLET | Freq: Once | ORAL | Status: AC
Start: 2020-12-31 — End: 2020-12-31
  Administered 2020-12-31: 20 mg via ORAL

## 2020-12-31 NOTE — Patient Instructions (Signed)
Maria Murphy  Discharge Instructions: Thank you for choosing [facility] to provide your oncology and hematology care.  If you have a lab appointment with the [facility], please come in thru the Main Entrance and check in at the main information desk.  Wear comfortable clothing and clothing appropriate for easy access to any Portacath or PICC line.   We strive to give you quality time with your provider. You may need to reschedule your appointment if you arrive late (15 or more minutes).  Arriving late affects you and other patients whose appointments are after yours.  Also, if you miss three or more appointments without notifying the office, you may be dismissed from the clinic at the provider's discretion.      For prescription refill requests, have your pharmacy contact our office and allow 72 hours for refills to be completed.    Today you received the following Venofer Iron Infusion 200 mg.      To help prevent nausea and vomiting after your treatment, we encourage you to take your nausea medication as directed.  BELOW ARE SYMPTOMS THAT SHOULD BE REPORTED IMMEDIATELY: . *FEVER GREATER THAN 100.4 F (38 C) OR HIGHER . *CHILLS OR SWEATING . *NAUSEA AND VOMITING THAT IS NOT CONTROLLED WITH YOUR NAUSEA MEDICATION . *UNUSUAL SHORTNESS OF BREATH . *UNUSUAL BRUISING OR BLEEDING . *URINARY PROBLEMS (pain or burning when urinating, or frequent urination) . *BOWEL PROBLEMS (unusual diarrhea, constipation, pain near the anus) . TENDERNESS IN MOUTH AND THROAT WITH OR WITHOUT PRESENCE OF ULCERS (sore throat, sores in mouth, or a toothache) . UNUSUAL RASH, SWELLING OR PAIN  . UNUSUAL VAGINAL DISCHARGE OR ITCHING   Items with * indicate a potential emergency and should be followed up as soon as possible or go to the Emergency Department if any problems should occur.  Please show the CHEMOTHERAPY ALERT CARD or IMMUNOTHERAPY ALERT CARD at check-in to the Emergency  Department and triage nurse.  Should you have questions after your visit or need to cancel or reschedule your appointment, please contact [facility] [phone]  and follow the prompts.  Office hours are 8:00 a.m. to 4:30 p.m. Monday - Friday. Please note that voicemails left after 4:00 p.m. may not be returned until the following business day.  We are closed weekends and major holidays. You have access to a nurse at all times for urgent questions. Please call the main number to the clinic [phone] and follow the prompts.  For any non-urgent questions, you may also contact your provider using MyChart. We now offer e-Visits for anyone 16 and older to request care online for non-urgent symptoms. For details visit [email or web address].   Also download the MyChart app! Go to the app store, search "MyChart", open the app, select [facility], and log in with your MyChart username and password.  Due to Covid, a mask is required upon entering the hospital/clinic. If you do not have a mask, one will be given to you upon arrival. For doctor visits, patients may have 1 support person aged 49 or older with them. For treatment visits, patients cannot have anyone with them due to current Covid guidelines and our immunocompromised population.

## 2020-12-31 NOTE — Progress Notes (Signed)
Venofer 200 mgs given today per MD orders. Tolerated infusion without adverse affects. Vital signs stable. No complaints at this time. Discharged from clinic via wheel chair  in stable condition. Alert and oriented x 3. F/U with [facility] as scheduled.

## 2021-01-01 ENCOUNTER — Encounter (HOSPITAL_COMMUNITY): Payer: Medicaid Other

## 2021-01-02 ENCOUNTER — Other Ambulatory Visit: Payer: Self-pay | Admitting: Genetic Counselor

## 2021-01-02 ENCOUNTER — Telehealth (HOSPITAL_COMMUNITY): Payer: Self-pay | Admitting: Physical Therapy

## 2021-01-02 ENCOUNTER — Other Ambulatory Visit: Payer: Self-pay

## 2021-01-02 ENCOUNTER — Inpatient Hospital Stay: Payer: Medicaid Other | Attending: Genetic Counselor | Admitting: Genetic Counselor

## 2021-01-02 ENCOUNTER — Inpatient Hospital Stay: Payer: Medicaid Other

## 2021-01-02 DIAGNOSIS — Z85828 Personal history of other malignant neoplasm of skin: Secondary | ICD-10-CM | POA: Insufficient documentation

## 2021-01-02 DIAGNOSIS — D649 Anemia, unspecified: Secondary | ICD-10-CM

## 2021-01-02 DIAGNOSIS — Z8049 Family history of malignant neoplasm of other genital organs: Secondary | ICD-10-CM

## 2021-01-02 DIAGNOSIS — Z800 Family history of malignant neoplasm of digestive organs: Secondary | ICD-10-CM

## 2021-01-02 DIAGNOSIS — Z8041 Family history of malignant neoplasm of ovary: Secondary | ICD-10-CM | POA: Diagnosis not present

## 2021-01-02 DIAGNOSIS — I2699 Other pulmonary embolism without acute cor pulmonale: Secondary | ICD-10-CM

## 2021-01-02 LAB — CBC WITH DIFFERENTIAL/PLATELET
Abs Immature Granulocytes: 0.01 10*3/uL (ref 0.00–0.07)
Basophils Absolute: 0 10*3/uL (ref 0.0–0.1)
Basophils Relative: 1 %
Eosinophils Absolute: 0.1 10*3/uL (ref 0.0–0.5)
Eosinophils Relative: 2 %
HCT: 30 % — ABNORMAL LOW (ref 36.0–46.0)
Hemoglobin: 9.3 g/dL — ABNORMAL LOW (ref 12.0–15.0)
Immature Granulocytes: 0 %
Lymphocytes Relative: 18 %
Lymphs Abs: 1.1 10*3/uL (ref 0.7–4.0)
MCH: 25.6 pg — ABNORMAL LOW (ref 26.0–34.0)
MCHC: 31 g/dL (ref 30.0–36.0)
MCV: 82.6 fL (ref 80.0–100.0)
Monocytes Absolute: 0.4 10*3/uL (ref 0.1–1.0)
Monocytes Relative: 7 %
Neutro Abs: 4.4 10*3/uL (ref 1.7–7.7)
Neutrophils Relative %: 72 %
Platelets: 207 10*3/uL (ref 150–400)
RBC: 3.63 MIL/uL — ABNORMAL LOW (ref 3.87–5.11)
RDW: 14.6 % (ref 11.5–15.5)
WBC: 6 10*3/uL (ref 4.0–10.5)
nRBC: 0 % (ref 0.0–0.2)

## 2021-01-02 LAB — COMPREHENSIVE METABOLIC PANEL
ALT: <6 U/L (ref 0–44)
AST: 13 U/L — ABNORMAL LOW (ref 15–41)
Albumin: 3.7 g/dL (ref 3.5–5.0)
Alkaline Phosphatase: 66 U/L (ref 38–126)
Anion gap: 8 (ref 5–15)
BUN: 12 mg/dL (ref 8–23)
CO2: 27 mmol/L (ref 22–32)
Calcium: 8.8 mg/dL — ABNORMAL LOW (ref 8.9–10.3)
Chloride: 107 mmol/L (ref 98–111)
Creatinine, Ser: 0.83 mg/dL (ref 0.44–1.00)
GFR, Estimated: >60 mL/min (ref >60)
Glucose, Bld: 102 mg/dL — ABNORMAL HIGH (ref 70–99)
Potassium: 3.8 mmol/L (ref 3.5–5.1)
Sodium: 142 mmol/L (ref 135–145)
Total Bilirubin: <0.2 mg/dL — ABNORMAL LOW (ref 0.3–1.2)
Total Protein: 6.8 g/dL (ref 6.5–8.1)

## 2021-01-02 LAB — GENETIC SCREENING ORDER

## 2021-01-02 NOTE — Progress Notes (Signed)
cancelled

## 2021-01-02 NOTE — Telephone Encounter (Signed)
pt asked that we cancel all remaining therapy appts

## 2021-01-03 ENCOUNTER — Inpatient Hospital Stay (HOSPITAL_COMMUNITY): Payer: Medicaid Other

## 2021-01-03 VITALS — BP 126/82 | HR 60 | Temp 97.2°F | Resp 18

## 2021-01-03 DIAGNOSIS — Z86711 Personal history of pulmonary embolism: Secondary | ICD-10-CM | POA: Diagnosis not present

## 2021-01-03 DIAGNOSIS — D500 Iron deficiency anemia secondary to blood loss (chronic): Secondary | ICD-10-CM

## 2021-01-03 MED ORDER — LORATADINE 10 MG PO TABS
ORAL_TABLET | ORAL | Status: AC
  Filled 2021-01-03: qty 1

## 2021-01-03 MED ORDER — LORATADINE 10 MG PO TABS
10.0000 mg | ORAL_TABLET | Freq: Once | ORAL | Status: AC
  Administered 2021-01-03: 10 mg via ORAL

## 2021-01-03 MED ORDER — SODIUM CHLORIDE 0.9 % IV SOLN
200.0000 mg | Freq: Once | INTRAVENOUS | Status: AC
  Administered 2021-01-03: 200 mg via INTRAVENOUS
  Filled 2021-01-03: qty 200

## 2021-01-03 MED ORDER — FAMOTIDINE 20 MG PO TABS
20.0000 mg | ORAL_TABLET | Freq: Once | ORAL | Status: AC
  Administered 2021-01-03: 20 mg via ORAL

## 2021-01-03 MED ORDER — ACETAMINOPHEN 325 MG PO TABS
ORAL_TABLET | ORAL | Status: AC
  Filled 2021-01-03: qty 2

## 2021-01-03 MED ORDER — FAMOTIDINE 20 MG PO TABS
ORAL_TABLET | ORAL | Status: AC
  Filled 2021-01-03: qty 1

## 2021-01-03 MED ORDER — ACETAMINOPHEN 325 MG PO TABS
650.0000 mg | ORAL_TABLET | Freq: Once | ORAL | Status: AC
  Administered 2021-01-03: 650 mg via ORAL

## 2021-01-03 MED ORDER — SODIUM CHLORIDE 0.9 % IV SOLN: Freq: Once | INTRAVENOUS | Status: AC

## 2021-01-03 NOTE — Patient Instructions (Signed)
[facility]  Discharge Instructions: Thank you for choosing [facility] to provide your oncology and hematology care.  If you have a lab appointment with the Cancer Center, please come in thru the Main Entrance and check in at the main information desk.  Wear comfortable clothing and clothing appropriate for easy access to any Portacath or PICC line.   We strive to give you quality time with your provider. You may need to reschedule your appointment if you arrive late (15 or more minutes).  Arriving late affects you and other patients whose appointments are after yours.  Also, if you miss three or more appointments without notifying the office, you may be dismissed from the clinic at the provider's discretion.      For prescription refill requests, have your pharmacy contact our office and allow 72 hours for refills to be completed.    Today you received the following Venofer infusion     To help prevent nausea and vomiting after your treatment, we encourage you to take your nausea medication as directed.  BELOW ARE SYMPTOMS THAT SHOULD BE REPORTED IMMEDIATELY: . *FEVER GREATER THAN 100.4 F (38 C) OR HIGHER . *CHILLS OR SWEATING . *NAUSEA AND VOMITING THAT IS NOT CONTROLLED WITH YOUR NAUSEA MEDICATION . *UNUSUAL SHORTNESS OF BREATH . *UNUSUAL BRUISING OR BLEEDING . *URINARY PROBLEMS (pain or burning when urinating, or frequent urination) . *BOWEL PROBLEMS (unusual diarrhea, constipation, pain near the anus) . TENDERNESS IN MOUTH AND THROAT WITH OR WITHOUT PRESENCE OF ULCERS (sore throat, sores in mouth, or a toothache) . UNUSUAL RASH, SWELLING OR PAIN  . UNUSUAL VAGINAL DISCHARGE OR ITCHING   Items with * indicate a potential emergency and should be followed up as soon as possible or go to the Emergency Department if any problems should occur.  Please show the CHEMOTHERAPY ALERT CARD or IMMUNOTHERAPY ALERT CARD at check-in to the Emergency Department and triage  nurse.  Should you have questions after your visit or need to cancel or reschedule your appointment, please contact [facility] [phone]  and follow the prompts.  Office hours are 8:00 a.m. to 4:30 p.m. Monday - Friday. Please note that voicemails left after 4:00 p.m. may not be returned until the following business day.  We are closed weekends and major holidays. You have access to a nurse at all times for urgent questions. Please call the main number to the clinic [phone] and follow the prompts.  For any non-urgent questions, you may also contact your provider using MyChart. We now offer e-Visits for anyone 18 and older to request care online for non-urgent symptoms. For details visit [email or web address].   Also download the MyChart app! Go to the app store, search "MyChart", open the app, select [facility], and log in with your MyChart username and password.  Due to Covid, a mask is required upon entering the hospital/clinic. If you do not have a mask, one will be given to you upon arrival. For doctor visits, patients may have 1 support person aged 18 or older with them. For treatment visits, patients cannot have anyone with them due to current Covid guidelines and our immunocompromised population.  

## 2021-01-03 NOTE — Progress Notes (Signed)
Patient presents today for Venofer infusion.  Vital signs stable.  Patient has no new complaints since last visit.  Peripheral Iv started and blood return noted pre and post infusion.    Venofer infusion given today per MD orders.  Stable during infusion without adverse affects.  Vital signs stable.  No complaints at this time.  Discharge from clinic ambulatory in stable condition.  Alert and oriented X 3.  Follow up with [facility] as scheduled.

## 2021-01-04 ENCOUNTER — Other Ambulatory Visit: Payer: Self-pay

## 2021-01-04 ENCOUNTER — Encounter (HOSPITAL_COMMUNITY): Payer: Self-pay

## 2021-01-04 ENCOUNTER — Emergency Department (HOSPITAL_COMMUNITY)
Admission: EM | Admit: 2021-01-04 | Discharge: 2021-01-04 | Disposition: A | Discharge status: Home / Self Care | Payer: Medicaid Other | Attending: Emergency Medicine | Admitting: Emergency Medicine

## 2021-01-04 DIAGNOSIS — R1013 Epigastric pain: Secondary | ICD-10-CM | POA: Diagnosis present

## 2021-01-04 DIAGNOSIS — K869 Disease of pancreas, unspecified: Secondary | ICD-10-CM | POA: Diagnosis not present

## 2021-01-04 DIAGNOSIS — Z85828 Personal history of other malignant neoplasm of skin: Secondary | ICD-10-CM | POA: Insufficient documentation

## 2021-01-04 DIAGNOSIS — G8929 Other chronic pain: Secondary | ICD-10-CM | POA: Diagnosis not present

## 2021-01-04 DIAGNOSIS — K8689 Other specified diseases of pancreas: Secondary | ICD-10-CM

## 2021-01-04 DIAGNOSIS — Z79899 Other long term (current) drug therapy: Secondary | ICD-10-CM | POA: Diagnosis not present

## 2021-01-04 DIAGNOSIS — I10 Essential (primary) hypertension: Secondary | ICD-10-CM | POA: Diagnosis not present

## 2021-01-04 DIAGNOSIS — K219 Gastro-esophageal reflux disease without esophagitis: Secondary | ICD-10-CM | POA: Diagnosis not present

## 2021-01-04 MED ORDER — HYDROMORPHONE HCL 2 MG/ML IJ SOLN
2.0000 mg | Freq: Once | INTRAMUSCULAR | Status: AC
  Administered 2021-01-04: 2 mg via INTRAMUSCULAR
  Filled 2021-01-04: qty 1

## 2021-01-04 MED ORDER — OXYCODONE-ACETAMINOPHEN 5-325 MG PO TABS: 1.0000 | ORAL_TABLET | Freq: Four times a day (QID) | ORAL | 0 refills | Status: DC | PRN

## 2021-01-04 NOTE — Discharge Instructions (Addendum)
Follow-up with Dr. Gala Romney.  Take the oxycodone as needed for pain.

## 2021-01-04 NOTE — ED Triage Notes (Signed)
Pt arrived via POV with abdominal pain rated at an 8 with n/v this am. States that the pain radiates to her mid spine.
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
## 2021-01-04 NOTE — ED Notes (Signed)
Pt ambulated to bathroom 

## 2021-01-06 ENCOUNTER — Inpatient Hospital Stay (HOSPITAL_COMMUNITY): Payer: Medicaid Other

## 2021-01-06 ENCOUNTER — Encounter: Payer: Self-pay | Admitting: Genetic Counselor

## 2021-01-06 ENCOUNTER — Other Ambulatory Visit: Payer: Self-pay

## 2021-01-06 ENCOUNTER — Telehealth: Payer: Self-pay

## 2021-01-06 VITALS — BP 133/68 | HR 63 | Temp 97.3°F | Resp 18

## 2021-01-06 DIAGNOSIS — Z8049 Family history of malignant neoplasm of other genital organs: Secondary | ICD-10-CM | POA: Insufficient documentation

## 2021-01-06 DIAGNOSIS — Z86711 Personal history of pulmonary embolism: Secondary | ICD-10-CM | POA: Diagnosis not present

## 2021-01-06 DIAGNOSIS — D500 Iron deficiency anemia secondary to blood loss (chronic): Secondary | ICD-10-CM

## 2021-01-06 DIAGNOSIS — Z8041 Family history of malignant neoplasm of ovary: Secondary | ICD-10-CM

## 2021-01-06 DIAGNOSIS — Z800 Family history of malignant neoplasm of digestive organs: Secondary | ICD-10-CM

## 2021-01-06 HISTORY — DX: Family history of malignant neoplasm of digestive organs: Z80.0

## 2021-01-06 HISTORY — DX: Family history of malignant neoplasm of other genital organs: Z80.49

## 2021-01-06 HISTORY — DX: Family history of malignant neoplasm of ovary: Z80.41

## 2021-01-06 MED ORDER — FAMOTIDINE 20 MG PO TABS
ORAL_TABLET | ORAL | Status: AC
  Filled 2021-01-06: qty 1

## 2021-01-06 MED ORDER — FAMOTIDINE 20 MG PO TABS
20.0000 mg | ORAL_TABLET | Freq: Once | ORAL | Status: AC
Start: 2021-01-06 — End: 2021-01-06
  Administered 2021-01-06: 20 mg via ORAL

## 2021-01-06 MED ORDER — SODIUM CHLORIDE 0.9 % IV SOLN
200.0000 mg | Freq: Once | INTRAVENOUS | Status: AC
  Administered 2021-01-06: 200 mg via INTRAVENOUS
  Filled 2021-01-06: qty 200

## 2021-01-06 MED ORDER — ACETAMINOPHEN 325 MG PO TABS
ORAL_TABLET | ORAL | Status: AC
  Filled 2021-01-06: qty 2

## 2021-01-06 MED ORDER — ACETAMINOPHEN 325 MG PO TABS
650.0000 mg | ORAL_TABLET | Freq: Once | ORAL | Status: AC
Start: 2021-01-06 — End: 2021-01-06
  Administered 2021-01-06: 650 mg via ORAL

## 2021-01-06 MED ORDER — LORATADINE 10 MG PO TABS
10.0000 mg | ORAL_TABLET | Freq: Once | ORAL | Status: AC
Start: 2021-01-06 — End: 2021-01-06
  Administered 2021-01-06: 10 mg via ORAL

## 2021-01-06 MED ORDER — LORATADINE 10 MG PO TABS
ORAL_TABLET | ORAL | Status: AC
  Filled 2021-01-06: qty 1

## 2021-01-06 MED ORDER — SODIUM CHLORIDE 0.9 % IV SOLN: Freq: Once | INTRAVENOUS | Status: AC

## 2021-01-06 MED FILL — Oxycodone w/ Acetaminophen Tab 5-325 MG: ORAL | Qty: 6 | Status: AC

## 2021-01-06 NOTE — Patient Instructions (Signed)
Maria Murphy  Discharge Instructions: Thank you for choosing [facility] to provide your oncology and hematology care.  If you have a lab appointment with the [facility], please come in thru the Main Entrance and check in at the main information desk.  Wear comfortable clothing and clothing appropriate for easy access to any Portacath or PICC line.   We strive to give you quality time with your provider. You may need to reschedule your appointment if you arrive late (15 or more minutes).  Arriving late affects you and other patients whose appointments are after yours.  Also, if you miss three or more appointments without notifying the office, you may be dismissed from the clinic at the provider's discretion.      For prescription refill requests, have your pharmacy contact our office and allow 72 hours for refills to be completed.        To help prevent nausea and vomiting after your treatment, we encourage you to take your nausea medication as directed.    Items with * indicate a potential emergency and should be followed up as soon as possible or go to the Emergency Department if any problems should occur.    Should you have questions after your visit or need to cancel or reschedule your appointment, please contact [facility] [phone]  and follow the prompts.  Office hours are 8:00 a.m. to 4:30 p.m. Monday - Friday. Please note that voicemails left after 4:00 p.m. may not be returned until the following business day.  We are closed weekends and major holidays. You have access to a nurse at all times for urgent questions. Please call the main number to the clinic [phone] and follow the prompts.  For any non-urgent questions, you may also contact your provider using MyChart. We now offer e-Visits for anyone 22 and older to request care online for non-urgent symptoms. For details visit [email or web address].   Also download the MyChart app!  Go to the app store, search "MyChart", open the app, select [facility], and log in with your MyChart username and password.  Due to Covid, a mask is required upon entering the hospital/clinic. If you do not have a mask, one will be given to you upon arrival. For doctor visits, patients may have 1 support person aged 5 or older with them. For treatment visits, patients cannot have anyone with them due to current Covid guidelines and our immunocompromised population.

## 2021-01-06 NOTE — Progress Notes (Signed)
Iron infusion given per orders. Patient tolerated it well without problems. Vitals stable and discharged home from clinic ambulatory. Follow up as scheduled.  

## 2021-01-06 NOTE — Telephone Encounter (Signed)
Pt called office to schedule OV with Dr. Gala Romney. Informed her OV will be scheduled when his July schedule is available.  Routing to [place] to schedule OV.

## 2021-01-06 NOTE — Telephone Encounter (Signed)
Added to July recall to see RMR only

## 2021-01-06 NOTE — Progress Notes (Signed)
REFERRING PROVIDER: Benay Pike, MD [place],   [address]  PRIMARY PROVIDER:  Ladell Pier, MD  PRIMARY REASON FOR VISIT:  1. Family history of pancreatic cancer   2. Family history of ovarian cancer   3. Family history of uterine cancer      HISTORY OF PRESENT ILLNESS:   Maria Murphy, a 65 y.o. female, was seen for a [place] cancer genetics consultation at the request of Dr. Chryl Heck due to a family history of cancer.  [name] presents to clinic today to discuss the possibility of a hereditary predisposition to cancer, to discuss genetic testing, and to further clarify her future cancer risks, as well as potential cancer risks for family members.   [name] has a history of basal cell carcinoma, which was removed from her nose at age 61.  A lesion has been detected on her pancreas, which is monitored regularly by MR.   CANCER HISTORY:  Oncology History   No history exists.    RISK FACTORS:  Menarche was at age 68.  First live birth at age 79.  OCP use for approximately 0 years.   Ovaries intact: yes.  Hysterectomy: no.  Menopausal status: postmenopausal.  HRT use: 0 years. Colonoscopy: no; not examined; has had Cologuard previously  Mammogram within the last year: yes. Number of breast biopsies: 0. Up to date with pelvic exams: no. Any excessive radiation exposure in the past: no  Past Medical History:  Diagnosis Date  . Collagen vascular disease ([place])   . Essential hypertension, benign 01/24/2015  . Family history of ovarian cancer 01/06/2021  . Family history of pancreatic cancer 01/06/2021  . Family history of uterine cancer 01/06/2021  . Foot ulcer ([place])   . GERD (gastroesophageal reflux disease)   . Hypertension   . Memory loss   . PUD (peptic ulcer disease)    with perforation s/p exploratory laparotomy  . Pulmonary embolus ([place]) 04/2020  . Skin cancer   . Vision abnormalities     Past Surgical History:  Procedure  Laterality Date  . BIOPSY  11/20/2020   Procedure: BIOPSY;  Surgeon: Rush Landmark Telford Nab., MD;  Location: [facility];  Service: Gastroenterology;;  . CENTRAL LINE INSERTION Right 04/22/2020   Procedure: CENTRAL LINE INSERTION;  Surgeon: Virl Cagey, MD;  Location: [facility];  Service: General;  Laterality: Right;  . DIAGNOSTIC LAPAROSCOPY    . ESOPHAGOGASTRODUODENOSCOPY N/A 11/20/2020   Procedure: ESOPHAGOGASTRODUODENOSCOPY (EGD);  Surgeon: Irving Copas., MD;  Location: [facility];  Service: Gastroenterology;  Laterality: N/A;  . ESOPHAGOGASTRODUODENOSCOPY (EGD) WITH PROPOFOL N/A 07/10/2020   Procedure: ESOPHAGOGASTRODUODENOSCOPY (EGD) WITH PROPOFOL;  Surgeon: Rogene Houston, MD;  [facility];  Service: Endoscopy;  Laterality: N/A;  . ESOPHAGOGASTRODUODENOSCOPY (EGD) WITH PROPOFOL N/A 10/18/2020   Procedure: ESOPHAGOGASTRODUODENOSCOPY (EGD) WITH PROPOFOL;  Surgeon: Harvel Quale, MD;  [facility];  Service: Gastroenterology;  Laterality: N/A;  . EUS N/A 11/20/2020   Procedure: UPPER ENDOSCOPIC ULTRASOUND (EUS) LINEAR;  Surgeon: Irving Copas., MD;  Location: [facility];  Service: Gastroenterology;  Laterality: N/A;  . GASTRORRHAPHY  04/22/2020   Procedure: GASTRORRHAPHY;  Surgeon: Virl Cagey, MD;  Location: [facility];  Service: General;;  . IR CATHETER TUBE CHANGE  05/09/2020  . LAPAROTOMY N/A 04/22/2020   Procedure: EXPLORATORY LAPAROTOMY;  Surgeon: Virl Cagey, MD;  Location: [facility];  Service: General;  Laterality: N/A;  . TONSILLECTOMY      Social History  Socioeconomic History  . Marital status: Widowed    Spouse name: Not on file  . Number of children: 2  . Years of education: Not on file  . Highest education level: Not on file  Occupational History  . Not on file  Tobacco Use  . Smoking status: Never Smoker  . Smokeless tobacco: Never Used  Vaping Use  . Vaping Use: Never used  Substance and Sexual  Activity  . Alcohol use: Yes    Alcohol/week: 2.0 - 3.0 standard drinks    Types: 2 - 3 Glasses of wine per week  . Drug use: No  . Sexual activity: Not Currently    Birth control/protection: Post-menopausal  Other Topics Concern  . Not on file  Social History Narrative  . Not on file   Social Determinants of Health   Financial Resource Strain: Not on file  Food Insecurity: Not on file  Transportation Needs: No Transportation Needs  . Lack of Transportation (Medical): No  . Lack of Transportation (Non-Medical): No  Physical Activity: Inactive  . Days of Exercise per Week: 0 days  . Minutes of Exercise per Session: 0 min  Stress: Not on file  Social Connections: Not on file     FAMILY HISTORY:  We obtained a detailed, 4-generation family history.  Significant diagnoses are listed below: Family History  Problem Relation Age of Onset  . Pancreatic cancer Mother 21  . Uterine cancer Maternal Grandmother 70  . Ovarian cancer Cousin        before age 74     [name] is unaware of previous family history of genetic testing for hereditary cancer risks. Patient's maternal ancestors are of [place] descent, and paternal ancestors are of Spanish descent. There is no reported Ashkenazi Jewish ancestry. There is no known consanguinity.  GENETIC COUNSELING ASSESSMENT: [name] is a 65 y.o. female with a family history of cancer which is somewhat suggestive of a hereditary cancer syndrome and predisposition to cancer given the presence of rare and related cancers in her maternal family. We, therefore, discussed and recommended the following at today's visit.   DISCUSSION: We discussed that 5 - 10% of cancer is hereditary, with most cases of hereditary pancreatic cancer associated with mutations in BRCA1/2.  There are other genes that can be associated with hereditary pancreatic, ovarian, or uterine cancer syndromes.  Type of cancer risk and level of risk are gene-specific. We  discussed that testing is beneficial for several reasons, including knowing about other cancer risks, identifying potential screening and risk-reduction options that may be appropriate, and to understanding if other family members could be at risk for cancer and allowing them to undergo genetic testing.  We reviewed the characteristics, features and inheritance patterns of hereditary cancer syndromes. We also discussed genetic testing, including the appropriate family members to test, the process of testing, insurance coverage and turn-around-time for results. We discussed the implications of a negative, positive, carrier and/or variant of uncertain significant result. We discussed that negative results would be uninformative given that Ms. Degeorge does not have a personal history of cancer. We recommended Ms. Bearden pursue genetic testing for a panel that contains genes associated with pancreatic, ovarian, and uterine cancers.  The Multi-Cancer Panel with pancreatitis genes and preliminary pancreatic cancer genes offered by [facility] includes sequencing and/or deletion duplication testing of the following 89 genes: AIP, ALK, APC, ATM, AXIN2,BAP1,  BARD1, BLM, BMPR1A, BRCA1, BRCA2, BRIP1, CASR, CDC73, CDH1, CDK4, CDKN1B, CDKN1C, CDKN2A (p14ARF), CDKN2A (p16INK4a),  CEBPA, CFTR, CHEK2, CPA1, CTNNA1, CTRC, DICER1, DIS3L2, EGFR (c.2369C>T, p.Thr790Met variant only), EPCAM (Deletion/duplication testing only), FH, FLCN, GATA2, GPC3, GREM1 (Promoter region deletion/duplication testing only), HOXB13 (c.251G>A, p.Gly84Glu), HRAS, KIT, MAX, MEN1, MET, MITF (c.952G>A, p.Glu318Lys variant only), MLH1, MSH2, MSH3, MSH6, MUTYH, NBN, NF1, NF2, NTHL1, PALB2, PDGFRA, PHOX2B, PMS2, POLD1, POLE, POT1, PRKAR1A, PRSS1, PTCH1, PTEN, RAD50, RAD51C, RAD51D, RB1, RECQL4, RET, RNF43, RUNX1, SDHAF2, SDHA (sequence changes only), SDHB, SDHC, SDHD, SMAD4, SMARCA4, SMARCB1, SMARCE1, SPINK1, STK11, SUFU, TERC, TERT, TMEM127, TP53, TSC1,  TSC2, VHL, WRN and WT1.   Based on Ms. Uphoff's family history of pancreatic cancer, she meets medical criteria for genetic testing. Despite that she meets criteria, she may still have an out of pocket cost.   We discussed that some people do not want to undergo genetic testing due to fear of genetic discrimination.  A federal law called the Genetic Information Non-Discrimination Act (GINA) of 2008 helps protect individuals against genetic discrimination based on their genetic test results.  It impacts both health insurance and employment.  With health insurance, it protects against increased premiums, being kicked off insurance or being forced to take a test in order to be insured.  For employment it protects against hiring, firing and promoting decisions based on genetic test results.  GINA does not apply to those in the [employer], those who work for companies with less than 15 employees, and new life insurance or long-term disability insurance policies.  Health status due to a cancer diagnosis is not protected under GINA.  PLAN: After considering the risks, benefits, and limitations, Ms. Chao provided informed consent to pursue genetic testing and the blood sample was sent to [facility] for analysis of the Multi-Cancer Panel + Pancreatic Cancer Genes. Results should be available within approximately 3 weeks' time, at which point they will be disclosed by telephone to Ms. Stankiewicz, as will any additional recommendations warranted by these results. Ms. Kalama will receive a summary of her genetic counseling visit and a copy of her results once available. This information will also be available in Epic.   Lastly, we encouraged Ms. Widen to remain in contact with cancer genetics annually so that we can continuously update the family history and inform her of any changes in cancer genetics and testing that may be of benefit for this family.   Ms. Vanderstelt questions were answered to her  satisfaction today. Our contact information was provided should additional questions or concerns arise. Thank you for the referral and allowing [place] to share in the care of your patient.   Sianni Cloninger M. Joette Catching, [place], [facility] Genetic Counselor [email or web address] (P) [phone]   The patient was seen for a total of 40 minutes in face-to-face genetic counseling.  Drs. Magrinat, Lindi Adie and/or Burr Medico were available to discuss this case as needed.  _______________________________________________________________________ For Office Staff:  Number of people involved in session: 1 Was an Intern/ student involved with case: no

## 2021-01-07 ENCOUNTER — Ambulatory Visit (HOSPITAL_COMMUNITY): Payer: Medicaid Other | Admitting: Physical Therapy

## 2021-01-07 ENCOUNTER — Telehealth: Payer: Self-pay | Admitting: Internal Medicine

## 2021-01-07 DIAGNOSIS — R109 Unspecified abdominal pain: Secondary | ICD-10-CM

## 2021-01-07 DIAGNOSIS — G8929 Other chronic pain: Secondary | ICD-10-CM

## 2021-01-07 NOTE — Telephone Encounter (Signed)
Will forward to provider  

## 2021-01-07 NOTE — Progress Notes (Signed)
This encounter was created in error - please disregard.

## 2021-01-07 NOTE — Telephone Encounter (Signed)
Copied from [place] [id number]. Topic: Referral - Request for Referral >> Jan 07, 2021 12:07 PM Erick Blinks wrote: Has patient seen PCP for this complaint? No. *If NO, is insurance requiring patient see PCP for this issue before PCP can refer them? Referral for which specialty: Pain Management  Preferred provider/office: Highest recommended by PCP  Reason for referral: Current pain medication does not work well enough

## 2021-01-08 ENCOUNTER — Inpatient Hospital Stay (HOSPITAL_COMMUNITY): Payer: Medicaid Other

## 2021-01-09 ENCOUNTER — Ambulatory Visit (HOSPITAL_COMMUNITY): Payer: Medicaid Other | Admitting: Physical Therapy

## 2021-01-10 ENCOUNTER — Other Ambulatory Visit: Payer: Self-pay

## 2021-01-10 ENCOUNTER — Inpatient Hospital Stay (HOSPITAL_COMMUNITY): Payer: Medicaid Other

## 2021-01-10 VITALS — BP 101/56 | HR 60 | Temp 97.0°F | Resp 18

## 2021-01-10 DIAGNOSIS — D500 Iron deficiency anemia secondary to blood loss (chronic): Secondary | ICD-10-CM

## 2021-01-10 DIAGNOSIS — Z86711 Personal history of pulmonary embolism: Secondary | ICD-10-CM | POA: Diagnosis not present

## 2021-01-10 MED ORDER — FAMOTIDINE 20 MG PO TABS
20.0000 mg | ORAL_TABLET | Freq: Once | ORAL | Status: AC
  Administered 2021-01-10: 20 mg via ORAL

## 2021-01-10 MED ORDER — LORATADINE 10 MG PO TABS
10.0000 mg | ORAL_TABLET | Freq: Once | ORAL | Status: AC
  Administered 2021-01-10: 10 mg via ORAL

## 2021-01-10 MED ORDER — IRON SUCROSE 20 MG/ML IV SOLN
200.0000 mg | Freq: Once | INTRAVENOUS | Status: AC
  Administered 2021-01-10: 200 mg via INTRAVENOUS
  Filled 2021-01-10: qty 200

## 2021-01-10 MED ORDER — ACETAMINOPHEN 325 MG PO TABS
650.0000 mg | ORAL_TABLET | Freq: Once | ORAL | Status: AC
  Administered 2021-01-10: 650 mg via ORAL

## 2021-01-10 MED ORDER — FAMOTIDINE 20 MG PO TABS
ORAL_TABLET | ORAL | Status: AC
  Filled 2021-01-10: qty 1

## 2021-01-10 MED ORDER — LORATADINE 10 MG PO TABS
ORAL_TABLET | ORAL | Status: AC
  Filled 2021-01-10: qty 1

## 2021-01-10 MED ORDER — ACETAMINOPHEN 325 MG PO TABS
ORAL_TABLET | ORAL | Status: AC
  Filled 2021-01-10: qty 2

## 2021-01-10 MED ORDER — SODIUM CHLORIDE 0.9 % IV SOLN: Freq: Once | INTRAVENOUS | Status: AC

## 2021-01-10 NOTE — Patient Instructions (Signed)
[facility]  Discharge Instructions: Thank you for choosing [facility] to provide your oncology and hematology care.  If you have a lab appointment with the Cancer Center, please come in thru the Main Entrance and check in at the main information desk.  Wear comfortable clothing and clothing appropriate for easy access to any Portacath or PICC line.   We strive to give you quality time with your provider. You may need to reschedule your appointment if you arrive late (15 or more minutes).  Arriving late affects you and other patients whose appointments are after yours.  Also, if you miss three or more appointments without notifying the office, you may be dismissed from the clinic at the provider's discretion.      For prescription refill requests, have your pharmacy contact our office and allow 72 hours for refills to be completed.    Today you received Venofer infusion.     To help prevent nausea and vomiting after your treatment, we encourage you to take your nausea medication as directed.  BELOW ARE SYMPTOMS THAT SHOULD BE REPORTED IMMEDIATELY: . *FEVER GREATER THAN 100.4 F (38 C) OR HIGHER . *CHILLS OR SWEATING . *NAUSEA AND VOMITING THAT IS NOT CONTROLLED WITH YOUR NAUSEA MEDICATION . *UNUSUAL SHORTNESS OF BREATH . *UNUSUAL BRUISING OR BLEEDING . *URINARY PROBLEMS (pain or burning when urinating, or frequent urination) . *BOWEL PROBLEMS (unusual diarrhea, constipation, pain near the anus) . TENDERNESS IN MOUTH AND THROAT WITH OR WITHOUT PRESENCE OF ULCERS (sore throat, sores in mouth, or a toothache) . UNUSUAL RASH, SWELLING OR PAIN  . UNUSUAL VAGINAL DISCHARGE OR ITCHING   Items with * indicate a potential emergency and should be followed up as soon as possible or go to the Emergency Department if any problems should occur.  Please show the CHEMOTHERAPY ALERT CARD or IMMUNOTHERAPY ALERT CARD at check-in to the Emergency Department and triage  nurse.  Should you have questions after your visit or need to cancel or reschedule your appointment, please contact [facility] [phone]  and follow the prompts.  Office hours are 8:00 a.m. to 4:30 p.m. Monday - Friday. Please note that voicemails left after 4:00 p.m. may not be returned until the following business day.  We are closed weekends and major holidays. You have access to a nurse at all times for urgent questions. Please call the main number to the clinic [phone] and follow the prompts.  For any non-urgent questions, you may also contact your provider using MyChart. We now offer e-Visits for anyone 18 and older to request care online for non-urgent symptoms. For details visit [email or web address].   Also download the MyChart app! Go to the app store, search "MyChart", open the app, select [place], and log in with your MyChart username and password.  Due to Covid, a mask is required upon entering the hospital/clinic. If you do not have a mask, one will be given to you upon arrival. For doctor visits, patients may have 1 support person aged 18 or older with them. For treatment visits, patients cannot have anyone with them due to current Covid guidelines and our immunocompromised population.  

## 2021-01-10 NOTE — Progress Notes (Signed)
Patient presents today for Venofer infusion.  Vital signs WNL.  Patient has no new complaints at this time.    Peripheral IV started and blood return noted pre and post infusion.    Venofer infusion given today per MD orders.  Stable during infusion without adverse affects.  Vital signs stable.  No complaints at this time.  Discharge from clinic ambulatory in stable condition.  Alert and oriented X 3.  Follow up with [facility] as scheduled. 

## 2021-01-11 ENCOUNTER — Telehealth: Payer: Self-pay | Admitting: Internal Medicine

## 2021-01-11 NOTE — Telephone Encounter (Signed)
-----   Message from Jackelyn Knife, [place] sent at 01/10/2021  4:32 PM EDT ----- Regarding: RE: Referral to Dr, Lindaann Pascal contacting pt vm is full  ----- Message ----- From: Ladell Pier, MD Sent: 01/09/2021   5:44 PM EDT To: Aviva Signs, MD, Jackelyn Knife, RMA Subject: RE: Referral to Dr, Barry Dienes                     I will have my CMA try to reach her and have her call your office to schedule f/u to discuss MRI results.  Thanks for letting me know.  Jay'a, please try to reach pt. ----- Message ----- From: Aviva Signs, MD Sent: 01/09/2021   8:46 AM EDT To: Ladell Pier, MD Subject: RE: Referral to Dr, Barry Dienes                     We have made the referral to Dr. Barry Dienes of [place].  Patient was supposed to follow up with me in the office to go over MRI results, but we have had a hard time coordinating this with the patient  Aviva Signs, MD ----- Message ----- From: Ladell Pier, MD Sent: 01/07/2021   9:47 PM EDT To: Aviva Signs, MD Subject: Referral to Dr, Barry Dienes                         Dr. Arnoldo Morale: I am the PCP for this pt. She has had recurrent visits to the ER for abdominal pain. When I last saw her, she told me that you would be referring her to Dr. Barry Dienes for further evaluation of the pancreatic mass after her MRI which was done 12/23/20.  I just wanted to know whether the referral has already been submitted.

## 2021-01-13 ENCOUNTER — Ambulatory Visit (HOSPITAL_COMMUNITY): Payer: Medicaid Other

## 2021-01-14 ENCOUNTER — Encounter (HOSPITAL_COMMUNITY): Payer: Medicaid Other | Admitting: Physical Therapy

## 2021-01-14 ENCOUNTER — Encounter: Payer: Self-pay | Admitting: Genetic Counselor

## 2021-01-14 ENCOUNTER — Telehealth: Payer: Self-pay | Admitting: Genetic Counselor

## 2021-01-14 DIAGNOSIS — Z1379 Encounter for other screening for genetic and chromosomal anomalies: Secondary | ICD-10-CM | POA: Insufficient documentation

## 2021-01-14 NOTE — Telephone Encounter (Signed)
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  Discussed that we do not know why there is cancer in the family. It could be sporadic, due a change in a gene she did not inherit, due to a different gene that we are not testing, or maybe our current technology may not be able to pick something up.  It will be important for her to keep in contact with genetics to keep up with whether additional testing may be needed.

## 2021-01-15 ENCOUNTER — Telehealth: Payer: Self-pay | Admitting: General Surgery

## 2021-01-15 ENCOUNTER — Ambulatory Visit: Payer: Self-pay | Admitting: Genetic Counselor

## 2021-01-15 DIAGNOSIS — Z1379 Encounter for other screening for genetic and chromosomal anomalies: Secondary | ICD-10-CM

## 2021-01-15 DIAGNOSIS — Z8049 Family history of malignant neoplasm of other genital organs: Secondary | ICD-10-CM

## 2021-01-15 DIAGNOSIS — Z8041 Family history of malignant neoplasm of ovary: Secondary | ICD-10-CM

## 2021-01-15 DIAGNOSIS — Z800 Family history of malignant neoplasm of digestive organs: Secondary | ICD-10-CM

## 2021-01-15 NOTE — Progress Notes (Signed)
HPI:  Ms. Soohoo was previously seen in the [place] clinic due to a family history of cancer and concerns regarding a hereditary predisposition to cancer. Please refer to our prior cancer genetics clinic note for more information regarding our discussion, assessment and recommendations, at the time. Ms. Watton recent genetic test results were disclosed to her, as were recommendations warranted by these results. These results and recommendations are discussed in more detail below.  CANCER HISTORY:  Ms. Augspurger has a history of basal cell carcinoma, which was removed from her nose at age 65.  A lesion has been detected on her pancreas, which is monitored regularly by MR.   FAMILY HISTORY:  We obtained a detailed, 4-generation family history.  Significant diagnoses are listed below: Family History  Problem Relation Age of Onset  . Pancreatic cancer Mother 44  . Uterine cancer Maternal Grandmother 70  . Ovarian cancer Cousin        before age 43     Ms. Crisci is unaware of previous family history of genetic testing for hereditary cancer risks. Patient's maternal ancestors are of [place] descent, and paternal ancestors are of Spanish descent. There is no reported Ashkenazi Jewish ancestry. There is no known consanguinity.  GENETIC TEST RESULTS: Genetic testing reported out on Jan 15, 2021.  The [facility] Multi-Cancer + Pancreatitis Genes found no pathogenic mutations. The Multi-Cancer Panel with pancreatitis genes and preliminary pancreatic cancer genes offered by [facility] includes sequencing and/or deletion duplication testing of the following 89 genes: AIP, ALK, APC, ATM, AXIN2,BAP1,  BARD1, BLM, BMPR1A, BRCA1, BRCA2, BRIP1, CASR, CDC73, CDH1, CDK4, CDKN1B, CDKN1C, CDKN2A (p14ARF), CDKN2A (p16INK4a), CEBPA, CFTR, CHEK2, CPA1, CTNNA1, CTRC, DICER1, DIS3L2, EGFR (c.2369C>T, p.Thr790Met variant only), EPCAM (Deletion/duplication testing only), FH, FLCN, GATA2, GPC3,  GREM1 (Promoter region deletion/duplication testing only), HOXB13 (c.251G>A, p.Gly84Glu), HRAS, KIT, MAX, MEN1, MET, MITF (c.952G>A, p.Glu318Lys variant only), MLH1, MSH2, MSH3, MSH6, MUTYH, NBN, NF1, NF2, NTHL1, PALB2, PDGFRA, PHOX2B, PMS2, POLD1, POLE, POT1, PRKAR1A, PRSS1, PTCH1, PTEN, RAD50, RAD51C, RAD51D, RB1, RECQL4, RET, RNF43, RUNX1, SDHAF2, SDHA (sequence changes only), SDHB, SDHC, SDHD, SMAD4, SMARCA4, SMARCB1, SMARCE1, SPINK1, STK11, SUFU, TERC, TERT, TMEM127, TP53, TSC1, TSC2, VHL, WRN and WT1.   The test report has been scanned into EPIC and is located under the Molecular Pathology section of the Results Review tab.  A portion of the result report is included below for reference.     We discussed with Ms. Schurman that because current genetic testing is not perfect, it is possible there may be a gene mutation in one of these genes that current testing cannot detect, but that chance is small.  We also discussed, that there could be another gene that has not yet been discovered, or that we have not yet tested, that is responsible for the cancer diagnoses in the family. It is also possible there is a hereditary cause for the cancer in the family that Ms. Carriero did not inherit and therefore was not identified in her testing.  Therefore, it is important to remain in touch with cancer genetics in the future so that we can continue to offer Ms. Ratz the most up to date genetic testing.   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  At this time, it is unknown if this variant is associated with increased cancer risk or if this is a normal finding, but most variants such as this get reclassified to being inconsequential.  It should not be used to make medical management decisions. With time, we suspect the lab will determine the significance of this variant, if any. If we do learn more about it, we will try to contact Ms.  Diaz to discuss it further. However, it is important to stay in touch with [place] periodically and keep the address and phone number up to date.  ADDITIONAL GENETIC TESTING: We discussed with Ms. Mccolm that her genetic testing was fairly extensive.  If there are genes identified to increase cancer risk that can be analyzed in the future, we would be happy to discuss and coordinate this testing at that time.    CANCER SCREENING RECOMMENDATIONS: Ms. Haaland test result is considered negative (normal).  This means that we have not identified a hereditary cause for her family history of cancer at this time.   While reassuring, this does not definitively rule out a hereditary predisposition to cancer. It is still possible that there could be genetic mutations that are undetectable by current technology. There could be genetic mutations in genes that have not been tested or identified to increase cancer risk.  Therefore, it is recommended she continue to follow the cancer management and screening guidelines provided by her primary healthcare provider.   An individual's cancer risk and medical management are not determined by genetic test results alone. Overall cancer risk assessment incorporates additional factors, including personal medical history, family history, and any available genetic information that may result in a personalized plan for cancer prevention and surveillance  RECOMMENDATIONS FOR FAMILY MEMBERS:  Individuals in this family might be at some increased risk of developing cancer, over the general population risk, simply due to the family history of cancer.  We recommended women in this family have a yearly mammogram beginning at age 43, or 39 years younger than the earliest onset of cancer, an annual clinical breast exam, and perform monthly breast self-exams. Women in this family should also have a gynecological exam as recommended by their primary provider. Family members should be  referred for colonoscopy starting at age 63.  It is also possible there is a hereditary cause for the cancer in Ms. Hauschild's family that she did not inherit and therefore was not identified in her.  Based on Ms. Diekman's family history of pancreatic cancer, we recommended her sisters have genetic counseling and testing. Ms. Lopata will let us know if we can be of any assistance in coordinating genetic counseling and/or testing for this family member.   FOLLOW-UP: Lastly, we discussed with Ms. Hartfield that cancer genetics is a rapidly advancing field and it is possible that new genetic tests will be appropriate for her and/or her family members in the future. We encouraged her to remain in contact with cancer genetics on an annual basis so we can update her personal and family histories and let her know of advances in cancer genetics that may benefit this family.   Our contact number was provided. Ms. Mabry questions were answered to her satisfaction, and she knows she is welcome to call us at anytime with additional questions or concerns.     Amariya Liskey M. Joette Catching, [place], [facility] Genetic Counselor [email or web address] (P) [phone]

## 2021-01-15 NOTE — Telephone Encounter (Signed)
Patient has been scheduled to see Dr. Norman Clay at [facility] on 02-28-2021 at 9:00am (arrive at 8:30am) Patient has been notified of appointment.

## 2021-01-16 ENCOUNTER — Encounter: Payer: Self-pay | Admitting: Dermatology

## 2021-01-16 ENCOUNTER — Ambulatory Visit: Payer: Medicaid Other | Admitting: Dermatology

## 2021-01-16 ENCOUNTER — Encounter (HOSPITAL_COMMUNITY): Payer: Medicaid Other | Admitting: Physical Therapy

## 2021-01-16 ENCOUNTER — Inpatient Hospital Stay: Payer: Medicaid Other | Admitting: Internal Medicine

## 2021-01-16 ENCOUNTER — Other Ambulatory Visit: Payer: Self-pay

## 2021-01-16 DIAGNOSIS — L578 Other skin changes due to chronic exposure to nonionizing radiation: Secondary | ICD-10-CM

## 2021-01-16 DIAGNOSIS — C44311 Basal cell carcinoma of skin of nose: Secondary | ICD-10-CM | POA: Diagnosis not present

## 2021-01-16 DIAGNOSIS — C4491 Basal cell carcinoma of skin, unspecified: Secondary | ICD-10-CM

## 2021-01-16 DIAGNOSIS — D492 Neoplasm of unspecified behavior of bone, soft tissue, and skin: Secondary | ICD-10-CM

## 2021-01-16 HISTORY — DX: Basal cell carcinoma of skin, unspecified: C44.91

## 2021-01-16 NOTE — Patient Instructions (Signed)

## 2021-01-16 NOTE — Progress Notes (Addendum)
   New Patient Visit  Subjective  Maria Murphy is a 65 y.o. female who presents for the following: Skin Problem (New pt here to have a area on the left nose checked, pt had a biopsy of  this area of her nose in Feb at [facility] dermatology biopsy results showed [place], pt never went back to have this area treated ).  The following portions of the chart were reviewed this encounter and updated as appropriate:   Tobacco  Allergies  Meds  Problems  Med Hx  Surg Hx  Fam Hx      Review of Systems:  No other skin or systemic complaints except as noted in HPI or Assessment and Plan.  Objective  Well appearing patient in no apparent distress; mood and affect are within normal limits.  A focused examination was performed including face. Relevant physical exam findings are noted in the Assessment and Plan.  left nose ~1.0 cm indurated patch       Assessment & Plan  Neoplasm of skin left nose  Skin / nail biopsy Type of biopsy: tangential   Informed consent: discussed and consent obtained   Patient was prepped and draped in usual sterile fashion: area prepped with alochol. Anesthesia: the lesion was anesthetized in a standard fashion   Anesthetic:  1% lidocaine w/ epinephrine 1-100,000 buffered w/ 8.4% NaHCO3 Instrument used: #15 blade   Hemostasis achieved with: pressure, aluminum chloride and electrodesiccation   Outcome: patient tolerated procedure well   Post-procedure details: wound care instructions given   Post-procedure details comment:  Ointment and small bandage  Specimen 1 - Surgical pathology Differential Diagnosis: R/O Persistent BCC vs other   Check Margins: No ~1.0 cm indurated patch Rule out persistent  BCC versus scar tissue  Discussed treatment option we could re biopsy in the office to see if the skin cancer is gone or we could refer for Mohs.  Patient would like to do a biopsy today.  Advised that although this may give a good idea whether there is some  cancer left, it only samples 1 spot, and if there is no cancer left in that 1 spot it does not mean there is not cancer left knee other areas and we will need to watch the area closely we do not pursue treatment.  Actinic Damage - chronic, secondary to cumulative UV radiation exposure/sun exposure over time - diffuse scaly erythematous macules with underlying dyspigmentation - Recommend daily broad spectrum sunscreen SPF 30+ to sun-exposed areas, reapply every 2 hours as needed.  - Recommend staying in the shade or wearing long sleeves, sun glasses (UVA+UVB protection) and wide brim hats (4-inch brim around the entire circumference of the hat). - Call for new or changing lesions.  Return in about 3 months (around 04/18/2021) for skin cancer BCC.  IMarye Round, CMA, am acting as scribe for Sarina Ser, MD .  Documentation: I have reviewed the above documentation for accuracy and completeness, and I agree with the above.  Sarina Ser, MD

## 2021-01-17 ENCOUNTER — Ambulatory Visit: Payer: Medicaid Other | Admitting: Internal Medicine

## 2021-01-17 ENCOUNTER — Inpatient Hospital Stay (HOSPITAL_COMMUNITY): Payer: Medicaid Other

## 2021-01-17 ENCOUNTER — Encounter (HOSPITAL_COMMUNITY): Payer: Self-pay

## 2021-01-17 VITALS — BP 128/72 | HR 72 | Temp 97.0°F | Resp 18

## 2021-01-17 DIAGNOSIS — Z86711 Personal history of pulmonary embolism: Secondary | ICD-10-CM | POA: Diagnosis not present

## 2021-01-17 DIAGNOSIS — D500 Iron deficiency anemia secondary to blood loss (chronic): Secondary | ICD-10-CM

## 2021-01-17 MED ORDER — SODIUM CHLORIDE 0.9 % IV SOLN: Freq: Once | INTRAVENOUS | Status: AC

## 2021-01-17 MED ORDER — ACETAMINOPHEN 325 MG PO TABS
650.0000 mg | ORAL_TABLET | Freq: Once | ORAL | Status: AC
  Administered 2021-01-17: 650 mg via ORAL
  Filled 2021-01-17: qty 2

## 2021-01-17 MED ORDER — LORATADINE 10 MG PO TABS
10.0000 mg | ORAL_TABLET | Freq: Once | ORAL | Status: AC
  Administered 2021-01-17: 10 mg via ORAL
  Filled 2021-01-17: qty 1

## 2021-01-17 MED ORDER — FAMOTIDINE 20 MG PO TABS
20.0000 mg | ORAL_TABLET | Freq: Once | ORAL | Status: AC
  Administered 2021-01-17: 20 mg via ORAL
  Filled 2021-01-17: qty 1

## 2021-01-17 MED ORDER — SODIUM CHLORIDE 0.9 % IV SOLN
200.0000 mg | Freq: Once | INTRAVENOUS | Status: AC
  Administered 2021-01-17: 200 mg via INTRAVENOUS
  Filled 2021-01-17: qty 200

## 2021-01-17 NOTE — Patient Instructions (Signed)
[facility]  Discharge Instructions: Thank you for choosing [facility] to provide your oncology and hematology care.  If you have a lab appointment with the [facility], please come in thru the Main Entrance and check in at the main information desk.  Wear comfortable clothing and clothing appropriate for easy access to any Portacath or PICC line.   We strive to give you quality time with your provider. You may need to reschedule your appointment if you arrive late (15 or more minutes).  Arriving late affects you and other patients whose appointments are after yours.  Also, if you miss three or more appointments without notifying the office, you may be dismissed from the clinic at the provider's discretion.      For prescription refill requests, have your pharmacy contact our office and allow 72 hours for refills to be completed.    To help prevent nausea and vomiting after your treatment, we encourage you to take your nausea medication as directed.   Items with * indicate a potential emergency and should be followed up as soon as possible or go to the Emergency Department if any problems should occur.    Should you have questions after your visit or need to cancel or reschedule your appointment, please contact [facility] [phone]  and follow the prompts.  Office hours are 8:00 a.m. to 4:30 p.m. Monday - Friday. Please note that voicemails left after 4:00 p.m. may not be returned until the following business day.  We are closed weekends and major holidays. You have access to a nurse at all times for urgent questions. Please call the main number to the clinic [phone] and follow the prompts.  For any non-urgent questions, you may also contact your provider using MyChart. We now offer e-Visits for anyone 60 and older to request care online for non-urgent symptoms. For details visit [email or web address].   Also download the MyChart app! Go to  the app store, search "MyChart", open the app, select [facility], and log in with your MyChart username and password.  Due to Covid, a mask is required upon entering the hospital/clinic. If you do not have a mask, one will be given to you upon arrival. For doctor visits, patients may have 1 support person aged 65 or older with them. For treatment visits, patients cannot have anyone with them due to current Covid guidelines and our immunocompromised population.

## 2021-01-17 NOTE — Progress Notes (Signed)
0920- pt requesting to leave now, could not stay for the whole period of post observation time after the iron infusion.   Vitals stable and discharged home from clinic ambulatory. Follow up as scheduled.

## 2021-01-20 ENCOUNTER — Encounter: Payer: Self-pay | Admitting: Dermatology

## 2021-01-23 ENCOUNTER — Telehealth: Payer: Self-pay

## 2021-01-23 DIAGNOSIS — C44311 Basal cell carcinoma of skin of nose: Secondary | ICD-10-CM

## 2021-01-23 NOTE — Telephone Encounter (Signed)
Patient informed of pathology results. Referral to be faxed to Dr. Lora Paula office in [place], [place].

## 2021-01-23 NOTE — Telephone Encounter (Signed)
-----   Message from Ralene Bathe, MD sent at 01/23/2021  4:45 PM EDT ----- Diagnosis Skin , left nose BASAL CELL CARCINOMA, NODULAR PATTERN, BASE INVOLVED  Cancer - BCC Schedule for [facility]

## 2021-01-24 ENCOUNTER — Emergency Department (HOSPITAL_COMMUNITY)
Admission: EM | Admit: 2021-01-24 | Discharge: 2021-01-25 | Disposition: A | Discharge status: Home / Self Care | Payer: Medicaid Other | Attending: Emergency Medicine | Admitting: Emergency Medicine

## 2021-01-24 ENCOUNTER — Emergency Department (HOSPITAL_COMMUNITY): Payer: Medicaid Other

## 2021-01-24 ENCOUNTER — Other Ambulatory Visit: Payer: Self-pay

## 2021-01-24 ENCOUNTER — Encounter (HOSPITAL_COMMUNITY): Payer: Self-pay | Admitting: Emergency Medicine

## 2021-01-24 DIAGNOSIS — R1011 Right upper quadrant pain: Secondary | ICD-10-CM | POA: Diagnosis present

## 2021-01-24 DIAGNOSIS — I10 Essential (primary) hypertension: Secondary | ICD-10-CM | POA: Insufficient documentation

## 2021-01-24 DIAGNOSIS — Z79899 Other long term (current) drug therapy: Secondary | ICD-10-CM | POA: Insufficient documentation

## 2021-01-24 DIAGNOSIS — Z85828 Personal history of other malignant neoplasm of skin: Secondary | ICD-10-CM | POA: Insufficient documentation

## 2021-01-24 NOTE — ED Triage Notes (Signed)
Pt c/o right sided rib pain after falling into the wall

## 2021-01-25 ENCOUNTER — Emergency Department (HOSPITAL_COMMUNITY): Payer: Medicaid Other

## 2021-01-25 LAB — CBC WITH DIFFERENTIAL/PLATELET
Abs Immature Granulocytes: 0.03 10*3/uL (ref 0.00–0.07)
Basophils Absolute: 0 10*3/uL (ref 0.0–0.1)
Basophils Relative: 0 %
Eosinophils Absolute: 0 10*3/uL (ref 0.0–0.5)
Eosinophils Relative: 0 %
HCT: 37.8 % (ref 36.0–46.0)
Hemoglobin: 12 g/dL (ref 12.0–15.0)
Immature Granulocytes: 0 %
Lymphocytes Relative: 18 %
Lymphs Abs: 1.2 10*3/uL (ref 0.7–4.0)
MCH: 27.1 pg (ref 26.0–34.0)
MCHC: 31.7 g/dL (ref 30.0–36.0)
MCV: 85.5 fL (ref 80.0–100.0)
Monocytes Absolute: 0.4 10*3/uL (ref 0.1–1.0)
Monocytes Relative: 6 %
Neutro Abs: 5.2 10*3/uL (ref 1.7–7.7)
Neutrophils Relative %: 76 %
Platelets: 187 10*3/uL (ref 150–400)
RBC: 4.42 MIL/uL (ref 3.87–5.11)
RDW: 18 % — ABNORMAL HIGH (ref 11.5–15.5)
WBC: 6.8 10*3/uL (ref 4.0–10.5)
nRBC: 0 % (ref 0.0–0.2)

## 2021-01-25 LAB — COMPREHENSIVE METABOLIC PANEL
ALT: 7 U/L (ref 0–44)
AST: 15 U/L (ref 15–41)
Albumin: 4.4 g/dL (ref 3.5–5.0)
Alkaline Phosphatase: 55 U/L (ref 38–126)
Anion gap: 7 (ref 5–15)
BUN: 12 mg/dL (ref 8–23)
CO2: 26 mmol/L (ref 22–32)
Calcium: 9.2 mg/dL (ref 8.9–10.3)
Chloride: 104 mmol/L (ref 98–111)
Creatinine, Ser: 0.78 mg/dL (ref 0.44–1.00)
GFR, Estimated: >60 mL/min (ref >60)
Glucose, Bld: 120 mg/dL — ABNORMAL HIGH (ref 70–99)
Potassium: 3.8 mmol/L (ref 3.5–5.1)
Sodium: 137 mmol/L (ref 135–145)
Total Bilirubin: 0.6 mg/dL (ref 0.3–1.2)
Total Protein: 7.6 g/dL (ref 6.5–8.1)

## 2021-01-25 MED ORDER — FENTANYL CITRATE (PF) 100 MCG/2ML IJ SOLN
50.0000 ug | Freq: Once | INTRAMUSCULAR | Status: AC
  Administered 2021-01-25: 50 ug via INTRAVENOUS
  Filled 2021-01-25: qty 2

## 2021-01-25 MED ORDER — LACTATED RINGERS IV BOLUS
1000.0000 mL | Freq: Once | INTRAVENOUS | Status: AC
  Administered 2021-01-25: 1000 mL via INTRAVENOUS

## 2021-01-26 NOTE — Progress Notes (Signed)
Subjective:    Patient ID: Maria Murphy, female    DOB: Feb 06, 1956, 65 y.o.   MRN: [id number]  65 y.o.F here on referral from Dr Wynetta Emery who saw this patient as new pt /post hosp TCM visit 07/23/20: This patient has a very complicated history she was hospitalized in August to end of September for perforated gastric ulcer and is status post exploratory laparotomy with Phillip Heal patch by Dr. Arnoldo Morale of general surgery.  Patient then developed pelvic and liver abscesses requiring IR drainage.  Cultures there grew Candida and Enterococcus.  She received antibiotics.  The patient was to be discharged only to develop submassive pulmonary emboli.  The patient was given intravenous heparin echo showed preserved LV function but RV strain.  There were elevated pressures.  Patient was sent home on Xarelto when she declined to SNF.  Patient then readmitted November 12-19 of that month with abdominal pain nausea and vomiting.  Endoscopy showed deep bulbar ulcer without stigmata of bleeding.  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  Surgery and gastroenterology saw the patient.  Blood counts remained stable she was then discharged.  Patient then went to the emergency room November 27 with upper abdominal pain and cramping and persistent nausea and vomiting stool culture positive for E. coli which was pathogenic.  Patient given IV fluids and sent home.  Patient then returned just today to the emergency room between 1 AM and 5:30 AM today with similar complaints as of the November 27 visit she was again given IV fluids and then discharged home.  The patient arrives in the office very distraught and upset she is throwing up in the trash can here in the office.  She actually now has formulation of plan to kill herself by overdosing on her own medication she even researched online what medicines to take and what combination to end her life the fastest.     The patient has continued  lisinopril HCTZ for hypertension, she is on Celexa for antidepressant but not working well with this.  She does take Aricept for memory loss.  01/27/21 Patient seen in return follow-up for pulmonary clearance for planned right knee total replacement  I saw this patient previously in November 2021.  Prior history of saddle pulmonary embolism following complications related to gastric surgery and gastric perforation with abscesses  The patient took a complete course of Xarelto therapy and then had stopped Xarelto when pulmonary emboli had resolved  Patient currently is having no difficulty off anticoagulation  Patient does complain of chronic right upper quadrant pain and episodes of emesis and decreased appetite with early satiety.  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  Patient can take nutritional liquid supplements without difficulty.  Patient was just in the emergency room last week and they ordered an ultrasound of the abdomen but the order needs to be issued from our office.  Patient has a history of mental health conditions with active suicidal thinking but this is now resolved itself.  Patient is on Celexa and doing well from a mental health perspective  Patient does take chronic tramadol for right knee pain and is yet to have the knee replacement but needs medical clearance first    Past Medical History:  Diagnosis Date  . Acute saddle pulmonary embolism without acute cor pulmonale (HCC)   . Collagen vascular disease ([place])   . Duodenal ulcer   .  Essential hypertension, benign 01/24/2015  . Family history of ovarian cancer 01/06/2021  . Family history of pancreatic cancer 01/06/2021  . Family history of uterine cancer 01/06/2021  . Foot ulcer ([place])   . GERD (gastroesophageal reflux disease)   . Hypertension   . Memory loss   . Peptic ulcer with perforation ([place])   . Perforated abdominal viscus 04/22/2020  . PUD  (peptic ulcer disease)    with perforation s/p exploratory laparotomy  . Pulmonary embolus ([place]) 04/2020  . Skin cancer   . Suicidal ideations 08/07/2020  . Vision abnormalities      Family History  Problem Relation Age of Onset  . High Cholesterol Mother   . Hypertension Mother   . Arthritis/Rheumatoid Mother   . Pancreatic cancer Mother 84  . Alcohol abuse Father   . Hypertension Maternal Grandmother   . Uterine cancer Maternal Grandmother 70  . Ovarian cancer Cousin        before age 2  . Colon cancer Neg Hx   . Colon polyps Neg Hx   . Inflammatory bowel disease Neg Hx      Social History   Socioeconomic History  . Marital status: Widowed    Spouse name: Not on file  . Number of children: 2  . Years of education: Not on file  . Highest education level: Not on file  Occupational History  . Not on file  Tobacco Use  . Smoking status: Never Smoker  . Smokeless tobacco: Never Used  Vaping Use  . Vaping Use: Never used  Substance and Sexual Activity  . Alcohol use: Yes    Alcohol/week: 2.0 - 3.0 standard drinks    Types: 2 - 3 Glasses of wine per week  . Drug use: No  . Sexual activity: Not Currently    Birth control/protection: Post-menopausal  Other Topics Concern  . Not on file  Social History Narrative  . Not on file   Social Determinants of Health   Financial Resource Strain: Not on file  Food Insecurity: Not on file  Transportation Needs: No Transportation Needs  . Lack of Transportation (Medical): No  . Lack of Transportation (Non-Medical): No  Physical Activity: Inactive  . Days of Exercise per Week: 0 days  . Minutes of Exercise per Session: 0 min  Stress: Not on file  Social Connections: Not on file  Intimate Partner Violence: Not At Risk  . Fear of Current or Ex-Partner: No  . Emotionally Abused: No  . Physically Abused: No  . Sexually Abused: No     Allergies  Allergen Reactions  . Wheat Extract Swelling  . Ketamine Other (See  Comments)    Agitation, confusion, hypertension, tachycardia     Outpatient Medications Prior to Visit  Medication Sig Dispense Refill  . acetaminophen (TYLENOL) 500 MG tablet Take 1 tablet (500 mg total) by mouth every 6 (six) hours as needed for mild pain or fever.    Marland Kitchen amLODipine (NORVASC) 2.5 MG tablet Take 1 tablet (2.5 mg total) by mouth daily. For BP 90 tablet 3  . dicyclomine (BENTYL) 10 MG capsule Take 10 mg by mouth 2 (two) times daily.    Marland Kitchen lactose free nutrition (BOOST) LIQD Take 237 mLs by mouth 2 (two) times daily.    . citalopram (CELEXA) 20 MG tablet Take 20 mg by mouth daily.    Marland Kitchen donepezil (ARICEPT) 10 MG tablet Take 0.5-1 tablets (5-10 mg total) by mouth in the morning and at bedtime. 30 tablet  2  . famotidine (PEPCID) 20 MG tablet Take 1 tablet (20 mg total) by mouth 2 (two) times daily. 20 tablet 0  . ferrous sulfate 325 (65 FE) MG tablet Take 1 tablet (325 mg total) by mouth daily with breakfast. 30 tablet 5  . gabapentin (NEURONTIN) 300 MG capsule Take 1 capsule (300 mg total) by mouth 2 (two) times daily. 60 capsule 6  . ondansetron (ZOFRAN ODT) 8 MG disintegrating tablet Take 1 tablet (8 mg total) by mouth every 8 (eight) hours as needed for nausea or vomiting. 30 tablet 1  . sucralfate (CARAFATE) 1 GM/10ML suspension Take 10 mLs (1 g total) by mouth 4 (four) times daily -  with meals and at bedtime. 420 mL 2  . tiZANidine (ZANAFLEX) 4 MG tablet Take 1 tablet (4 mg total) by mouth every 8 (eight) hours as needed for muscle spasms. 30 tablet 0  . traMADol (ULTRAM) 50 MG tablet Take 1 tablet (50 mg total) by mouth every 8 (eight) hours as needed. 60 tablet 0  . traZODone (DESYREL) 100 MG tablet Take 100 mg by mouth at bedtime.    . lidocaine (XYLOCAINE) 2 % solution Use as directed 15 mLs in the mouth or throat every 6 (six) hours as needed (esophagus/abdominal pain). (Patient not taking: Reported on 01/27/2021) 100 mL 1  . oxyCODONE-acetaminophen (PERCOCET/ROXICET) 5-325 MG  tablet Take 1 tablet by mouth every 6 (six) hours as needed for severe pain. (Patient not taking: Reported on 01/27/2021) 6 tablet 0  . pantoprazole (PROTONIX) 40 MG tablet Take 1 tablet (40 mg total) by mouth 2 (two) times daily. 60 tablet 6  . promethazine (PHENERGAN) 25 MG tablet Take 1 tablet (25 mg total) by mouth every 6 (six) hours as needed for nausea or vomiting. 10 tablet 0   No facility-administered medications prior to visit.      Review of Systems  Constitutional: Positive for activity change and unexpected weight change. Negative for appetite change and fatigue.  HENT: Negative.   Respiratory: Negative.   Cardiovascular: Negative for chest pain, palpitations and leg swelling.  Gastrointestinal: Positive for abdominal pain and nausea. Negative for abdominal distention and vomiting.  Genitourinary: Negative.   Musculoskeletal:       Knee pain  Psychiatric/Behavioral: Negative for behavioral problems, decreased concentration, dysphoric mood, self-injury, sleep disturbance and suicidal ideas. The patient is not nervous/anxious.        Objective:   Physical Exam  Vitals:   01/27/21 0857  BP: 131/84  Pulse: 66  Resp: 16  SpO2: 100%  Weight: 167 lb 12.8 oz (76.1 kg)    Gen: Patient is focused and affect is excellent she is no longer suicidal ENT: No lesions,  mouth clear,  oropharynx clear, no postnasal drip  Neck: No JVD, no TMG, no carotid bruits  Lungs: No use of accessory muscles, no dullness to percussion, clear without rales or rhonchi  Cardiovascular: RRR, heart sounds normal, no murmur or gallops, no peripheral edema  Abdomen: Slight tender in right upper quadrant bowel sounds active  Musculoskeletal: Decreased range of motion right knee  Neuro: alert, non focal  Skin: Warm, no lesions or rashes  BMP Latest Ref Rng & Units 01/25/2021 01/02/2021 12/29/2020  Glucose 70 - 99 mg/dL 120(H) 102(H) 103(H)  BUN 8 - 23 mg/dL 12 12 17   Creatinine 0.44 - 1.00 mg/dL  0.78 0.83 0.77  Sodium 135 - 145 mmol/L 137 142 141  Potassium 3.5 - 5.1 mmol/L 3.8 3.8 3.8  Chloride 98 - 111 mmol/L 104 107 109  CO2 22 - 32 mmol/L 26 27 22   Calcium 8.9 - 10.3 mg/dL 9.2 8.8(L) 8.9   Hepatic Function Panel     Component Value Date/Time   PROT 7.6 01/25/2021 0050   ALBUMIN 4.4 01/25/2021 0050   AST 15 01/25/2021 0050   ALT 7 01/25/2021 0050   ALKPHOS 55 01/25/2021 0050   BILITOT 0.6 01/25/2021 0050   CBC Latest Ref Rng & Units 01/25/2021 01/02/2021 12/29/2020  WBC 4.0 - 10.5 K/uL 6.8 6.0 8.0  Hemoglobin 12.0 - 15.0 g/dL 12.0 9.3(L) 10.0(L)  Hematocrit 36.0 - 46.0 % 37.8 30.0(L) 32.5(L)  Platelets 150 - 400 K/uL 187 207 193        Assessment & Plan:  I personally reviewed all images and lab data in the [facility] system as well as any outside material available during this office visit and agree with the  radiology impressions.   Acute saddle pulmonary embolism without acute cor pulmonale (HCC) Pulmonary emboli now resolved  Recent CT scan showed complete resolution of emboli and now off Xarelto without evidence of recurrence  From a pulmonary perspective the patient can undergo planned total right knee replacement  I would recommend early Lovenox prophylaxis injections postop when hemostasis has been achieved and give consideration to oral Xarelto 10 mg daily for at least 10 days after discharge to ensure no deep venous thrombosis occurs in the right lower extremity after right knee replacement  No other pulmonary recommendations and patient can be cleared from a pulmonary standpoint for planned right total knee replacement   Depression Depression well-controlled on Celexa continue same refills given  Primary osteoarthritis of both knees The patient is cleared for planned right total knee replacement  Cholelithiasis Will obtain ultrasound of gallbladder   Yanin was seen today for surgical clearance .  Diagnoses and all orders for this visit:  Calculus of  gallbladder without cholecystitis without obstruction -     US Abdomen Limited RUQ (LIVER/GB); Future  RUQ abdominal pain -     US Abdomen Limited RUQ (LIVER/GB); Future  Opioid contract exists -     [id number] 11+Oxyco+Alc+Crt-Bund  Peripheral polyneuropathy -     gabapentin (NEURONTIN) 300 MG capsule; Take 1 capsule (300 mg total) by mouth 2 (two) times daily.  Non-intractable vomiting with nausea, unspecified vomiting type -     ondansetron (ZOFRAN ODT) 8 MG disintegrating tablet; Take 1 tablet (8 mg total) by mouth every 8 (eight) hours as needed for nausea or vomiting.  PUD (peptic ulcer disease) -     pantoprazole (PROTONIX) 40 MG tablet; Take 1 tablet (40 mg total) by mouth 2 (two) times daily. -     sucralfate (CARAFATE) 1 GM/10ML suspension; Take 10 mLs (1 g total) by mouth 4 (four) times daily -  with meals and at bedtime.  Chronic abdominal pain -     traMADol (ULTRAM) 50 MG tablet; Take 1 tablet (50 mg total) by mouth every 8 (eight) hours as needed.  Acute saddle pulmonary embolism without acute cor pulmonale (HCC)  Severe episode of recurrent major depressive disorder, without psychotic features ([place])  Primary osteoarthritis of both knees  Other orders -     citalopram (CELEXA) 20 MG tablet; Take 1 tablet (20 mg total) by mouth daily. -     donepezil (ARICEPT) 10 MG tablet; Take 0.5-1 tablets (5-10 mg total) by mouth in the morning and at bedtime. -     tiZANidine (ZANAFLEX)  4 MG tablet; Take 1 tablet (4 mg total) by mouth every 8 (eight) hours as needed for muscle spasms. -     traZODone (DESYREL) 100 MG tablet; Take 1 tablet (100 mg total) by mouth at bedtime.  Spent 35 minutes interviewing this patient reviewing her records reviewing prior documentations in the chart of previous ER visits previous primary care visits and reviewed images  This is complex medical decision making
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
## 2021-01-27 ENCOUNTER — Ambulatory Visit: Payer: Medicaid Other | Attending: Critical Care Medicine | Admitting: Critical Care Medicine

## 2021-01-27 ENCOUNTER — Encounter: Payer: Self-pay | Admitting: Critical Care Medicine

## 2021-01-27 ENCOUNTER — Other Ambulatory Visit: Payer: Self-pay

## 2021-01-27 VITALS — BP 131/84 | HR 66 | Resp 16 | Wt 167.8 lb

## 2021-01-27 DIAGNOSIS — Z79891 Long term (current) use of opiate analgesic: Secondary | ICD-10-CM

## 2021-01-27 DIAGNOSIS — M170 Bilateral primary osteoarthritis of knee: Secondary | ICD-10-CM | POA: Diagnosis not present

## 2021-01-27 DIAGNOSIS — K8020 Calculus of gallbladder without cholecystitis without obstruction: Secondary | ICD-10-CM | POA: Diagnosis not present

## 2021-01-27 DIAGNOSIS — I2692 Saddle embolus of pulmonary artery without acute cor pulmonale: Secondary | ICD-10-CM | POA: Diagnosis not present

## 2021-01-27 DIAGNOSIS — K279 Peptic ulcer, site unspecified, unspecified as acute or chronic, without hemorrhage or perforation: Secondary | ICD-10-CM | POA: Insufficient documentation

## 2021-01-27 DIAGNOSIS — G8929 Other chronic pain: Secondary | ICD-10-CM

## 2021-01-27 DIAGNOSIS — Z8249 Family history of ischemic heart disease and other diseases of the circulatory system: Secondary | ICD-10-CM | POA: Insufficient documentation

## 2021-01-27 DIAGNOSIS — R1011 Right upper quadrant pain: Secondary | ICD-10-CM

## 2021-01-27 DIAGNOSIS — G629 Polyneuropathy, unspecified: Secondary | ICD-10-CM | POA: Diagnosis not present

## 2021-01-27 DIAGNOSIS — Z79899 Other long term (current) drug therapy: Secondary | ICD-10-CM | POA: Insufficient documentation

## 2021-01-27 DIAGNOSIS — R112 Nausea with vomiting, unspecified: Secondary | ICD-10-CM

## 2021-01-27 DIAGNOSIS — F332 Major depressive disorder, recurrent severe without psychotic features: Secondary | ICD-10-CM | POA: Insufficient documentation

## 2021-01-27 DIAGNOSIS — I10 Essential (primary) hypertension: Secondary | ICD-10-CM | POA: Diagnosis not present

## 2021-01-27 DIAGNOSIS — K219 Gastro-esophageal reflux disease without esophagitis: Secondary | ICD-10-CM | POA: Diagnosis not present

## 2021-01-27 DIAGNOSIS — R109 Unspecified abdominal pain: Secondary | ICD-10-CM

## 2021-01-27 MED ORDER — DONEPEZIL HCL 10 MG PO TABS: 5.0000 mg | ORAL_TABLET | Freq: Two times a day (BID) | ORAL | 2 refills | Status: DC

## 2021-01-27 MED ORDER — ONDANSETRON 8 MG PO TBDP: 8.0000 mg | ORAL_TABLET | Freq: Three times a day (TID) | ORAL | 1 refills | Status: DC | PRN

## 2021-01-27 MED ORDER — GABAPENTIN 300 MG PO CAPS: 300.0000 mg | ORAL_CAPSULE | Freq: Two times a day (BID) | ORAL | 6 refills | Status: DC

## 2021-01-27 MED ORDER — PANTOPRAZOLE SODIUM 40 MG PO TBEC: 40.0000 mg | DELAYED_RELEASE_TABLET | Freq: Two times a day (BID) | ORAL | 6 refills | Status: DC

## 2021-01-27 MED ORDER — TRAMADOL HCL 50 MG PO TABS: 50.0000 mg | ORAL_TABLET | Freq: Three times a day (TID) | ORAL | 0 refills | Status: DC | PRN

## 2021-01-27 MED ORDER — TIZANIDINE HCL 4 MG PO TABS: 4.0000 mg | ORAL_TABLET | Freq: Three times a day (TID) | ORAL | 0 refills | Status: DC | PRN

## 2021-01-27 MED ORDER — SUCRALFATE 1 GM/10ML PO SUSP: 1.0000 g | Freq: Three times a day (TID) | ORAL | 2 refills | Status: DC

## 2021-01-27 MED ORDER — CITALOPRAM HYDROBROMIDE 20 MG PO TABS: 20.0000 mg | ORAL_TABLET | Freq: Every day | ORAL | 3 refills | Status: DC

## 2021-01-27 MED ORDER — TRAZODONE HCL 100 MG PO TABS: 100.0000 mg | ORAL_TABLET | Freq: Every day | ORAL | 1 refills | Status: DC

## 2021-01-27 NOTE — Assessment & Plan Note (Signed)
Pulmonary emboli now resolved  Recent CT scan showed complete resolution of emboli and now off Xarelto without evidence of recurrence  From a pulmonary perspective the patient can undergo planned total right knee replacement  I would recommend early Lovenox prophylaxis injections postop when hemostasis has been achieved and give consideration to oral Xarelto 10 mg daily for at least 10 days after discharge to ensure no deep venous thrombosis occurs in the right lower extremity after right knee replacement  No other pulmonary recommendations and patient can be cleared from a pulmonary standpoint for planned right total knee replacement

## 2021-01-27 NOTE — Progress Notes (Signed)
Pt states she is having pain on her right side

## 2021-01-27 NOTE — Assessment & Plan Note (Signed)
Will obtain ultrasound of gallbladder

## 2021-01-27 NOTE — Assessment & Plan Note (Signed)
Depression well-controlled on Celexa continue same refills given

## 2021-01-27 NOTE — Assessment & Plan Note (Signed)
The patient is cleared for planned right total knee replacement

## 2021-02-04 ENCOUNTER — Ambulatory Visit (HOSPITAL_COMMUNITY)
Admission: RE | Admit: 2021-02-04 | Discharge: 2021-02-04 | Disposition: A | Discharge status: Home / Self Care | Payer: Medicaid Other | Source: Ambulatory Visit | Attending: Critical Care Medicine | Admitting: Critical Care Medicine

## 2021-02-04 ENCOUNTER — Other Ambulatory Visit: Payer: Self-pay

## 2021-02-04 DIAGNOSIS — K8020 Calculus of gallbladder without cholecystitis without obstruction: Secondary | ICD-10-CM | POA: Diagnosis present

## 2021-02-04 DIAGNOSIS — R1011 Right upper quadrant pain: Secondary | ICD-10-CM | POA: Diagnosis present

## 2021-02-05 LAB — OXYCODONE/OXYMORPHONE, CONFIRM
OXYCODONE/OXYMORPH: POSITIVE — AB
OXYCODONE: 1222 ng/mL
OXYCODONE: POSITIVE — AB
OXYMORPHONE (GC/MS): 1061 ng/mL
OXYMORPHONE: POSITIVE — AB

## 2021-02-05 LAB — DRUG SCREEN 764883 11+OXYCO+ALC+CRT-BUND
Amphetamines, Urine: NEGATIVE ng/mL
BENZODIAZ UR QL: NEGATIVE ng/mL
Barbiturate: NEGATIVE ng/mL
Cannabinoid Quant, Ur: NEGATIVE ng/mL
Cocaine (Metabolite): NEGATIVE ng/mL
Creatinine: 274.7 mg/dL (ref 20.0–300.0)
Ethanol: NEGATIVE %
Meperidine: NEGATIVE ng/mL
Methadone Screen, Urine: NEGATIVE ng/mL
Phencyclidine: NEGATIVE ng/mL
Propoxyphene: NEGATIVE ng/mL
Tramadol: NEGATIVE ng/mL
pH, Urine: 5.4 (ref 4.5–8.9)

## 2021-02-05 LAB — OPIATES CONFIRMATION, URINE: Opiates: NEGATIVE ng/mL

## 2021-02-14 ENCOUNTER — Other Ambulatory Visit: Payer: Self-pay

## 2021-02-14 ENCOUNTER — Inpatient Hospital Stay (HOSPITAL_COMMUNITY): Payer: Medicaid Other | Attending: Oncology | Admitting: Physician Assistant

## 2021-02-14 VITALS — BP 123/84 | HR 82 | Temp 97.0°F | Resp 20 | Wt 180.2 lb

## 2021-02-14 DIAGNOSIS — K862 Cyst of pancreas: Secondary | ICD-10-CM | POA: Diagnosis not present

## 2021-02-14 DIAGNOSIS — K219 Gastro-esophageal reflux disease without esophagitis: Secondary | ICD-10-CM | POA: Diagnosis not present

## 2021-02-14 DIAGNOSIS — Z7901 Long term (current) use of anticoagulants: Secondary | ICD-10-CM | POA: Insufficient documentation

## 2021-02-14 DIAGNOSIS — I10 Essential (primary) hypertension: Secondary | ICD-10-CM | POA: Insufficient documentation

## 2021-02-14 DIAGNOSIS — Z8711 Personal history of peptic ulcer disease: Secondary | ICD-10-CM | POA: Insufficient documentation

## 2021-02-14 DIAGNOSIS — Z86711 Personal history of pulmonary embolism: Secondary | ICD-10-CM | POA: Diagnosis present

## 2021-02-14 DIAGNOSIS — D500 Iron deficiency anemia secondary to blood loss (chronic): Secondary | ICD-10-CM

## 2021-02-14 DIAGNOSIS — D509 Iron deficiency anemia, unspecified: Secondary | ICD-10-CM | POA: Diagnosis not present

## 2021-02-14 DIAGNOSIS — R002 Palpitations: Secondary | ICD-10-CM | POA: Diagnosis not present

## 2021-02-14 DIAGNOSIS — Z79899 Other long term (current) drug therapy: Secondary | ICD-10-CM | POA: Insufficient documentation

## 2021-02-14 DIAGNOSIS — Z800 Family history of malignant neoplasm of digestive organs: Secondary | ICD-10-CM | POA: Insufficient documentation

## 2021-02-14 NOTE — Patient Instructions (Signed)
[place] at [facility] Discharge Instructions  You were seen today by Tarri Abernethy PA-C for your iron deficiency anemia.  Your blood counts have improved to the normal range after your iron infusions.  We will repeat labs to check your blood and iron in 3 months.  We will follow-up with you in 3 months to see if you need any further treatment.  ----------------------------------------  Thank you for choosing [place] at [facility] to provide your oncology and hematology care.  To afford each patient quality time with our provider, please arrive at least 15 minutes before your scheduled appointment time.   If you have a lab appointment with the [facility] please come in thru the Main Entrance and check in at the main information desk.  You need to re-schedule your appointment should you arrive 10 or more minutes late.  We strive to give you quality time with our providers, and arriving late affects you and other patients whose appointments are after yours.  Also, if you no show three or more times for appointments you may be dismissed from the clinic at the providers discretion.     Again, thank you for choosing [facility].  Our hope is that these requests will decrease the amount of time that you wait before being seen by our physicians.       _____________________________________________________________  Should you have questions after your visit to [facility], please contact our office at [phone] and follow the prompts.  Our office hours are 8:00 a.m. and 4:30 p.m. Monday - Friday.  Please note that voicemails left after 4:00 p.m. may not be returned until the following business day.  We are closed weekends and major holidays.  You do have access to a nurse 24-7, just call the main number to the clinic [phone] and do not press any options, hold on the line and a nurse will answer the phone.    For  prescription refill requests, have your pharmacy contact our office and allow 72 hours.    Due to Covid, you will need to wear a mask upon entering the hospital. If you do not have a mask, a mask will be given to you at the Main Entrance upon arrival. For doctor visits, patients may have 1 support person age 45 or older with them. For treatment visits, patients can not have anyone with them due to social distancing guidelines and our immunocompromised population.

## 2021-02-14 NOTE — Progress Notes (Signed)
[address] [place] [address], [place] [address]   CLINIC:  Medical Oncology/Hematology  PCP:  Ladell Pier, MD [place] [address] [phone]   REASON FOR VISIT:  Follow-up for normocytic anemia with history of bilateral pulmonary embolism  PRIOR THERAPY: Xarelto for PE; PRBC transfusion for GI bleed anemia  CURRENT THERAPY: Intermittent IV iron, not on any current anticoagulation  INTERVAL HISTORY:  Maria Murphy 65 y.o. female returns for routine follow-up of her normocytic anemia with history of bilateral pulmonary embolism.  She was last seen by Dr. Chryl Heck on 12/20/2020.  At today's visit, she reports feeling much better at this visit.  She reports that she feels improved after IV iron.  She denies any current signs or symptoms of blood loss, no gross rectal hemorrhage or melena.  She continues to take iron supplementation at home.  No current signs or symptoms concerning for recurrent PE or DVT, no unilateral leg swelling/erythema/pain, dyspnea on exertion, syncope.    She does report some chronic intermittent palpitations associated with chest discomfort.  (She has not yet spoken to her PCP or cardiologist about this, and I have advised her to speak to them as soon as possible.  She is asymptomatic during today's visit.)  She has 80% energy and 100% appetite. She endorses that she is maintaining a stable weight.    REVIEW OF SYSTEMS:  Review of Systems  Constitutional:  Negative for appetite change, chills, diaphoresis, fatigue, fever and unexpected weight change.  HENT:   Negative for lump/mass and nosebleeds.   Eyes:  Negative for eye problems.  Respiratory:  Negative for cough, hemoptysis and shortness of breath.   Cardiovascular:  Positive for chest pain (Occasional chest discomfort associated with palpitations) and palpitations. Negative for leg swelling.  Gastrointestinal:  Negative for abdominal pain, blood in stool,  constipation, diarrhea, nausea and vomiting.  Genitourinary:  Negative for hematuria.   Skin: Negative.   Neurological:  Positive for numbness. Negative for dizziness, headaches and light-headedness.  Hematological:  Does not bruise/bleed easily.  Psychiatric/Behavioral:  Positive for depression. Negative for sleep disturbance. The patient is nervous/anxious.      PAST MEDICAL/SURGICAL HISTORY:  Past Medical History:  Diagnosis Date   Acute saddle pulmonary embolism without acute cor pulmonale (HCC)    Collagen vascular disease (HCC)    Duodenal ulcer    Essential hypertension, benign 01/24/2015   Family history of ovarian cancer 01/06/2021   Family history of pancreatic cancer 01/06/2021   Family history of uterine cancer 01/06/2021   Foot ulcer ([place])    GERD (gastroesophageal reflux disease)    Hypertension    Memory loss    Peptic ulcer with perforation (HCC)    Perforated abdominal viscus 04/22/2020   PUD (peptic ulcer disease)    with perforation s/p exploratory laparotomy   Pulmonary embolus ([place]) 04/2020   Skin cancer    Suicidal ideations 08/07/2020   Vision abnormalities    Past Surgical History:  Procedure Laterality Date   BIOPSY  11/20/2020   Procedure: BIOPSY;  Surgeon: Irving Copas., MD;  Location: [facility];  Service: Gastroenterology;;   CENTRAL LINE INSERTION Right 04/22/2020   Procedure: CENTRAL LINE INSERTION;  Surgeon: Virl Cagey, MD;  Location: [facility];  Service: General;  Laterality: Right;   DIAGNOSTIC LAPAROSCOPY     ESOPHAGOGASTRODUODENOSCOPY N/A 11/20/2020   Procedure: ESOPHAGOGASTRODUODENOSCOPY (EGD);  Surgeon: Irving Copas., MD;  Location: [facility];  Service: Gastroenterology;  Laterality: N/A;   ESOPHAGOGASTRODUODENOSCOPY (EGD) WITH PROPOFOL N/A 07/10/2020   Procedure: ESOPHAGOGASTRODUODENOSCOPY (EGD) WITH PROPOFOL;  Surgeon: Rogene Houston, MD;  Location: [facility];  Service: Endoscopy;  Laterality: N/A;    ESOPHAGOGASTRODUODENOSCOPY (EGD) WITH PROPOFOL N/A 10/18/2020   Procedure: ESOPHAGOGASTRODUODENOSCOPY (EGD) WITH PROPOFOL;  Surgeon: Harvel Quale, MD;  Location: [facility];  Service: Gastroenterology;  Laterality: N/A;   EUS N/A 11/20/2020   Procedure: UPPER ENDOSCOPIC ULTRASOUND (EUS) LINEAR;  Surgeon: Irving Copas., MD;  Location: [facility];  Service: Gastroenterology;  Laterality: N/A;   GASTRORRHAPHY  04/22/2020   Procedure: GASTRORRHAPHY;  Surgeon: Virl Cagey, MD;  Location: [facility];  Service: General;;   IR CATHETER TUBE CHANGE  05/09/2020   LAPAROTOMY N/A 04/22/2020   Procedure: EXPLORATORY LAPAROTOMY;  Surgeon: Virl Cagey, MD;  Location: [facility];  Service: General;  Laterality: N/A;   TONSILLECTOMY       SOCIAL HISTORY:  Social History   Socioeconomic History   Marital status: Widowed    Spouse name: Not on file   Number of children: 2   Years of education: Not on file   Highest education level: Not on file  Occupational History   Not on file  Tobacco Use   Smoking status: Never   Smokeless tobacco: Never  Vaping Use   Vaping Use: Never used  Substance and Sexual Activity   Alcohol use: Yes    Alcohol/week: 2.0 - 3.0 standard drinks    Types: 2 - 3 Glasses of wine per week   Drug use: No   Sexual activity: Not Currently    Birth control/protection: Post-menopausal  Other Topics Concern   Not on file  Social History Narrative   Not on file   Social Determinants of Health   Financial Resource Strain: Not on file  Food Insecurity: Not on file  Transportation Needs: No Transportation Needs   Lack of Transportation (Medical): No   Lack of Transportation (Non-Medical): No  Physical Activity: Inactive   Days of Exercise per Week: 0 days   Minutes of Exercise per Session: 0 min  Stress: Not on file  Social Connections: Not on file  Intimate Partner Violence: Not At Risk   Fear of Current or Ex-Partner: No   Emotionally  Abused: No   Physically Abused: No   Sexually Abused: No    FAMILY HISTORY:  Family History  Problem Relation Age of Onset   High Cholesterol Mother    Hypertension Mother    Arthritis/Rheumatoid Mother    Pancreatic cancer Mother 8   Alcohol abuse Father    Hypertension Maternal Grandmother    Uterine cancer Maternal Grandmother 40   Ovarian cancer Cousin        before age 33   Colon cancer Neg Hx    Colon polyps Neg Hx    Inflammatory bowel disease Neg Hx     CURRENT MEDICATIONS:  Outpatient Encounter Medications as of 02/14/2021  Medication Sig   acetaminophen (TYLENOL) 500 MG tablet Take 1 tablet (500 mg total) by mouth every 6 (six) hours as needed for mild pain or fever.   amLODipine (NORVASC) 2.5 MG tablet Take 1 tablet (2.5 mg total) by mouth daily. For BP   citalopram (CELEXA) 20 MG tablet Take 1 tablet (20 mg total) by mouth daily.   dicyclomine (BENTYL) 10 MG capsule Take 10 mg by mouth 2 (two) times daily.   donepezil (ARICEPT) 10 MG tablet Take 0.5-1 tablets (5-10 mg  total) by mouth in the morning and at bedtime.   gabapentin (NEURONTIN) 300 MG capsule Take 1 capsule (300 mg total) by mouth 2 (two) times daily.   lactose free nutrition (BOOST) LIQD Take 237 mLs by mouth 2 (two) times daily.   ondansetron (ZOFRAN ODT) 8 MG disintegrating tablet Take 1 tablet (8 mg total) by mouth every 8 (eight) hours as needed for nausea or vomiting.   pantoprazole (PROTONIX) 40 MG tablet Take 1 tablet (40 mg total) by mouth 2 (two) times daily.   sucralfate (CARAFATE) 1 GM/10ML suspension Take 10 mLs (1 g total) by mouth 4 (four) times daily -  with meals and at bedtime.   tiZANidine (ZANAFLEX) 4 MG tablet Take 1 tablet (4 mg total) by mouth every 8 (eight) hours as needed for muscle spasms.   traMADol (ULTRAM) 50 MG tablet Take 1 tablet (50 mg total) by mouth every 8 (eight) hours as needed.   traZODone (DESYREL) 100 MG tablet Take 1 tablet (100 mg total) by mouth at bedtime.    No facility-administered encounter medications on file as of 02/14/2021.    ALLERGIES:  Allergies  Allergen Reactions   Wheat Extract Swelling   Ketamine Other (See Comments)    Agitation, confusion, hypertension, tachycardia     PHYSICAL EXAM:  ECOG PERFORMANCE STATUS: 0 - Asymptomatic  There were no vitals filed for this visit. There were no vitals filed for this visit. Physical Exam Constitutional:      Appearance: Normal appearance.  HENT:     Head: Normocephalic and atraumatic.     Mouth/Throat:     Mouth: Mucous membranes are moist.  Eyes:     Extraocular Movements: Extraocular movements intact.     Pupils: Pupils are equal, round, and reactive to light.  Cardiovascular:     Rate and Rhythm: Normal rate and regular rhythm.     Pulses: Normal pulses.     Heart sounds: Normal heart sounds.  Pulmonary:     Effort: Pulmonary effort is normal.     Breath sounds: Normal breath sounds.  Abdominal:     General: Bowel sounds are normal.     Palpations: Abdomen is soft.     Tenderness: There is no abdominal tenderness.  Musculoskeletal:        General: No swelling.     Right lower leg: Edema (Trace ankle edema) present.     Left lower leg: Edema (Trace ankle edema) present.  Lymphadenopathy:     Cervical: No cervical adenopathy.  Skin:    General: Skin is warm and dry.  Neurological:     General: No focal deficit present.     Mental Status: She is alert and oriented to person, place, and time.  Psychiatric:        Mood and Affect: Mood normal.        Behavior: Behavior normal.     LABORATORY DATA:  I have reviewed the labs as listed.  CBC    Component Value Date/Time   WBC 6.8 01/25/2021 0050   RBC 4.42 01/25/2021 0050   HGB 12.0 01/25/2021 0050   HCT 37.8 01/25/2021 0050   HCT 33.7 (L) 12/20/2020 1302   PLT 187 01/25/2021 0050   MCV 85.5 01/25/2021 0050   MCH 27.1 01/25/2021 0050   MCHC 31.7 01/25/2021 0050   RDW 18.0 (H) 01/25/2021 0050    LYMPHSABS 1.2 01/25/2021 0050   MONOABS 0.4 01/25/2021 0050   EOSABS 0.0 01/25/2021 0050   BASOSABS 0.0  01/25/2021 0050   CMP Latest Ref Rng & Units 01/25/2021 01/02/2021 12/29/2020  Glucose 70 - 99 mg/dL 120(H) 102(H) 103(H)  BUN 8 - 23 mg/dL 12 12 17   Creatinine 0.44 - 1.00 mg/dL 0.78 0.83 0.77  Sodium 135 - 145 mmol/L 137 142 141  Potassium 3.5 - 5.1 mmol/L 3.8 3.8 3.8  Chloride 98 - 111 mmol/L 104 107 109  CO2 22 - 32 mmol/L 26 27 22   Calcium 8.9 - 10.3 mg/dL 9.2 8.8(L) 8.9  Total Protein 6.5 - 8.1 g/dL 7.6 6.8 7.0  Total Bilirubin 0.3 - 1.2 mg/dL 0.6 <0.2(L) 0.5  Alkaline Phos 38 - 126 U/L 55 66 55  AST 15 - 41 U/L 15 13(L) 16  ALT 0 - 44 U/L 7 <6 9    DIAGNOSTIC IMAGING:  I have independently reviewed the relevant imaging and discussed with the patient.  ASSESSMENT: 1.  History of bilateral pulmonary embolism - Bilateral PE with right heart strain in September 2021, which occurred during ulcer repair which suggests likely provoked episode - Patient was on Xarelto for Close to 6 months, then had GI bleeding - hence anticoagulation was held and hypercoagulable work-up was ordered - Hypercoagulable work-up in March 2022 was negative - Most recent CT with PE protocol was negative - This was single episode of likely provoked PE, although it was high burden PE causing right heart strain; she has GI ulcers that intermittently bleeding; therefore, do not recommend long-term anticoagulation at this time - PLAN: No intervention or anticoagulation recommended at this time.  If patient has another episode of DVT/PE in the future, we will discuss with her again any further recommendations or changes to the above plan  2.  Iron deficiency anemia - Patient had GI bleed in March 2022 while on anticoagulation for pulmonary embolism, received PRBC transfusion x1 - She has longstanding history of peptic ulcers that intermittently bleed, most recent EGD 10/18/2020 showed erosive gastropathy with  gastric ulcer and duodenal ulcer without any current stigmata of bleeding -Work-up for anemia initiated at last visit in April 2022 showed Hgb 10.6, ferritin 5, iron saturation 6%; vitamin B12, folate, LDH, SPEP/light chains normal - Suspect that she had persistent anemia and iron deficiency secondary to GI bleed in March 2022 - She denies any current signs or symptoms of gross rectal hemorrhage or melena, has mildly dark stool secondary to oral iron supplementation  - She received IV iron with Venofer x5 from 12/31/2020 through 01/17/2021 - Most recent labs (CBC on 01/25/2021) show resolution of anemia with Hgb 12.0/MCV 85.5 - PLAN: Continue ferrous sulfate daily at home.  Repeat CBC and iron panel in 3 months with phone visit.   3.  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 Charlynne Cousins, we dont have any information that she has pancreatic cancer at this time. - Patient's mother had pancreatic cancer at age 9 - She was referred for genetic counseling (see note from genetic counselor dated 01/15/2021) - PLAN: Continue follow-up as above    PLAN SUMMARY & DISPOSITION: -RTC in 3 months with repeat CBC/iron panel the week before  All questions were answered. The patient knows to call the clinic with any problems, questions or concerns.  Medical decision making: Low  Time spent on visit: I spent 15 minutes counseling the patient face to face. The total time spent in the appointment was 25 minutes and more than 50% was  on counseling.   Harriett Rush, PA-C  02/14/21 11:38 AM

## 2021-02-16 ENCOUNTER — Other Ambulatory Visit: Payer: Self-pay

## 2021-02-16 ENCOUNTER — Emergency Department (HOSPITAL_COMMUNITY)
Admission: EM | Admit: 2021-02-16 | Discharge: 2021-02-18 | Disposition: A | Discharge status: Other Acute Inpatient Hospital | Payer: Medicaid Other | Attending: Emergency Medicine | Admitting: Emergency Medicine

## 2021-02-16 ENCOUNTER — Encounter (HOSPITAL_COMMUNITY): Payer: Self-pay | Admitting: *Deleted

## 2021-02-16 DIAGNOSIS — I10 Essential (primary) hypertension: Secondary | ICD-10-CM | POA: Diagnosis not present

## 2021-02-16 DIAGNOSIS — R202 Paresthesia of skin: Secondary | ICD-10-CM | POA: Insufficient documentation

## 2021-02-16 DIAGNOSIS — F333 Major depressive disorder, recurrent, severe with psychotic symptoms: Secondary | ICD-10-CM | POA: Diagnosis not present

## 2021-02-16 DIAGNOSIS — Z79899 Other long term (current) drug therapy: Secondary | ICD-10-CM | POA: Diagnosis not present

## 2021-02-16 DIAGNOSIS — F1020 Alcohol dependence, uncomplicated: Secondary | ICD-10-CM | POA: Insufficient documentation

## 2021-02-16 DIAGNOSIS — Z85828 Personal history of other malignant neoplasm of skin: Secondary | ICD-10-CM | POA: Insufficient documentation

## 2021-02-16 DIAGNOSIS — X58XXXA Exposure to other specified factors, initial encounter: Secondary | ICD-10-CM | POA: Diagnosis not present

## 2021-02-16 DIAGNOSIS — Z20822 Contact with and (suspected) exposure to covid-19: Secondary | ICD-10-CM | POA: Insufficient documentation

## 2021-02-16 DIAGNOSIS — T39392A Poisoning by other nonsteroidal anti-inflammatory drugs [NSAID], intentional self-harm, initial encounter: Secondary | ICD-10-CM | POA: Diagnosis present

## 2021-02-16 DIAGNOSIS — T1491XA Suicide attempt, initial encounter: Secondary | ICD-10-CM | POA: Diagnosis not present

## 2021-02-16 DIAGNOSIS — R45851 Suicidal ideations: Secondary | ICD-10-CM | POA: Diagnosis not present

## 2021-02-16 LAB — CBC WITH DIFFERENTIAL/PLATELET
Abs Immature Granulocytes: 0.01 10*3/uL (ref 0.00–0.07)
Basophils Absolute: 0 10*3/uL (ref 0.0–0.1)
Basophils Relative: 0 %
Eosinophils Absolute: 0.1 10*3/uL (ref 0.0–0.5)
Eosinophils Relative: 2 %
HCT: 35.6 % — ABNORMAL LOW (ref 36.0–46.0)
Hemoglobin: 11.3 g/dL — ABNORMAL LOW (ref 12.0–15.0)
Immature Granulocytes: 0 %
Lymphocytes Relative: 19 %
Lymphs Abs: 1.3 10*3/uL (ref 0.7–4.0)
MCH: 27.2 pg (ref 26.0–34.0)
MCHC: 31.7 g/dL (ref 30.0–36.0)
MCV: 85.8 fL (ref 80.0–100.0)
Monocytes Absolute: 0.5 10*3/uL (ref 0.1–1.0)
Monocytes Relative: 8 %
Neutro Abs: 4.9 10*3/uL (ref 1.7–7.7)
Neutrophils Relative %: 71 %
Platelets: 155 10*3/uL (ref 150–400)
RBC: 4.15 MIL/uL (ref 3.87–5.11)
RDW: 20.3 % — ABNORMAL HIGH (ref 11.5–15.5)
WBC: 6.8 10*3/uL (ref 4.0–10.5)
nRBC: 0 % (ref 0.0–0.2)

## 2021-02-16 LAB — ACETAMINOPHEN LEVEL: Acetaminophen (Tylenol), Serum: <10 ug/mL — ABNORMAL LOW (ref 10–30)

## 2021-02-16 LAB — COMPREHENSIVE METABOLIC PANEL
ALT: 10 U/L (ref 0–44)
AST: 19 U/L (ref 15–41)
Albumin: 3.9 g/dL (ref 3.5–5.0)
Alkaline Phosphatase: 43 U/L (ref 38–126)
Anion gap: 8 (ref 5–15)
BUN: 20 mg/dL (ref 8–23)
CO2: 23 mmol/L (ref 22–32)
Calcium: 8.7 mg/dL — ABNORMAL LOW (ref 8.9–10.3)
Chloride: 106 mmol/L (ref 98–111)
Creatinine, Ser: 0.78 mg/dL (ref 0.44–1.00)
GFR, Estimated: >60 mL/min (ref >60)
Glucose, Bld: 114 mg/dL — ABNORMAL HIGH (ref 70–99)
Potassium: 4.2 mmol/L (ref 3.5–5.1)
Sodium: 137 mmol/L (ref 135–145)
Total Bilirubin: 0.6 mg/dL (ref 0.3–1.2)
Total Protein: 6.5 g/dL (ref 6.5–8.1)

## 2021-02-16 LAB — CBG MONITORING, ED: Glucose-Capillary: 119 mg/dL — ABNORMAL HIGH (ref 70–99)

## 2021-02-16 LAB — SALICYLATE LEVEL: Salicylate Lvl: <7 mg/dL — ABNORMAL LOW (ref 7.0–30.0)

## 2021-02-16 LAB — RESP PANEL BY RT-PCR (FLU A&B, COVID) ARPGX2
Influenza A by PCR: NEGATIVE
Influenza B by PCR: NEGATIVE
SARS Coronavirus 2 by RT PCR: NEGATIVE

## 2021-02-16 LAB — ETHANOL: Alcohol, Ethyl (B): <10 mg/dL (ref <10)

## 2021-02-16 NOTE — ED Notes (Signed)
Pt has been wanded by security. 

## 2021-02-16 NOTE — ED Provider Notes (Signed)
[facility] EMERGENCY DEPARTMENT Provider Note   CSN: [id number] Arrival date & time: 02/16/21  2141     History Chief Complaint  Patient presents with   Suicide Attempt    Maria Murphy is a 65 y.o. female.  HPI  Patient is a 65 year old female with a complicated medical history as noted below.  She presents to the emergency department due to ingestion of Aleve as well as tramadol in an attempt to end her life around 9 PM tonight.  Patient states that she has a history of depression and has been increasingly depressed.  She feels as if she has a burden to other people.  She states yesterday she took 74 Aleve throughout the day which had no effect so today she took 12 Aleve as well as 6-7, 50 mg tramadol around 9 PM.  She reports fatigue and tingling around the lips but otherwise denies any complaints.  Patient notes that sometimes she "sees people that are not there" but denies any auditory hallucinations and denies that these people say anything to her.  No nausea, vomiting, chest pain, shortness of breath, abdominal pain.     Past Medical History:  Diagnosis Date   Acute saddle pulmonary embolism without acute cor pulmonale (HCC)    Collagen vascular disease (HCC)    Duodenal ulcer    Essential hypertension, benign 01/24/2015   Family history of ovarian cancer 01/06/2021   Family history of pancreatic cancer 01/06/2021   Family history of uterine cancer 01/06/2021   Foot ulcer ([place])    GERD (gastroesophageal reflux disease)    Hypertension    Memory loss    Peptic ulcer with perforation ([place])    Perforated abdominal viscus 04/22/2020   PUD (peptic ulcer disease)    with perforation s/p exploratory laparotomy   Pulmonary embolus ([place]) 04/2020   Skin cancer    Suicidal ideations 08/07/2020   Vision abnormalities     Patient Active Problem List   Diagnosis Date Noted   Genetic testing 01/14/2021   Family history of pancreatic cancer 01/06/2021   Family history of ovarian  cancer 01/06/2021   Family history of uterine cancer 01/06/2021   IDA (iron deficiency anemia) 12/25/2020   Opioid contract exists 12/09/2020   Pancreatic lesion    Chronic abdominal pain    History of DVT (deep vein thrombosis) 11/15/2020   Uncontrolled pain 11/15/2020   Anemia    History of pulmonary embolus (PE) 10/18/2020   Obesity (BMI 30-39.9) 10/18/2020   GERD (gastroesophageal reflux disease) 10/18/2020   Pancreatic mass 10/09/2020   Acquired complex cyst of kidney 10/09/2020   Basal cell carcinoma (BCC) of left side of nose 10/09/2020   Aortic atherosclerosis ([place]) 10/09/2020   Peripheral polyneuropathy 10/09/2020   Dehydration    Chronic diarrhea 09/24/2020   Diarrhea    Cholelithiasis 09/05/2020   Primary osteoarthritis of both knees 07/24/2020   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   Essential hypertension 01/24/2015   Memory loss 01/24/2015   Neck pain 01/24/2015   Depression 01/24/2015    Past Surgical History:  Procedure Laterality Date   BIOPSY  11/20/2020   Procedure: BIOPSY;  Surgeon: Rush Landmark Telford Nab., MD;  Location: [facility];  Service: Gastroenterology;;   CENTRAL LINE INSERTION Right 04/22/2020   Procedure: CENTRAL LINE INSERTION;  Surgeon: Virl Cagey, MD;  Location: [facility];  Service: General;  Laterality: Right;   DIAGNOSTIC LAPAROSCOPY     ESOPHAGOGASTRODUODENOSCOPY N/A 11/20/2020   Procedure: ESOPHAGOGASTRODUODENOSCOPY (EGD);  Surgeon:  Mansouraty, Telford Nab., MD;  Location: [facility];  Service: Gastroenterology;  Laterality: N/A;   ESOPHAGOGASTRODUODENOSCOPY (EGD) WITH PROPOFOL N/A 07/10/2020   Procedure: ESOPHAGOGASTRODUODENOSCOPY (EGD) WITH PROPOFOL;  Surgeon: Rogene Houston, MD;  [facility];  Service: Endoscopy;  Laterality: N/A;   ESOPHAGOGASTRODUODENOSCOPY (EGD) WITH PROPOFOL N/A 10/18/2020   Procedure: ESOPHAGOGASTRODUODENOSCOPY (EGD) WITH PROPOFOL;  Surgeon: Harvel Quale, MD;  [facility];  Service: Gastroenterology;  Laterality: N/A;   EUS N/A 11/20/2020   Procedure: UPPER ENDOSCOPIC ULTRASOUND (EUS) LINEAR;  Surgeon: Irving Copas., MD;  Location: [facility];  Service: Gastroenterology;  Laterality: N/A;   GASTRORRHAPHY  04/22/2020   Procedure: GASTRORRHAPHY;  Surgeon: Virl Cagey, MD;  Location: [facility];  Service: General;;   IR CATHETER TUBE CHANGE  05/09/2020   LAPAROTOMY N/A 04/22/2020   Procedure: EXPLORATORY LAPAROTOMY;  Surgeon: Virl Cagey, MD;  Location: [facility];  Service: General;  Laterality: N/A;   TONSILLECTOMY       OB History     Gravida  2   Para  2   Term      Preterm      AB      Living  2      SAB      IAB      Ectopic      Multiple      Live Births              Family History  Problem Relation Age of Onset   High Cholesterol Mother    Hypertension Mother    Arthritis/Rheumatoid Mother    Pancreatic cancer Mother 79   Alcohol abuse Father    Hypertension Maternal Grandmother    Uterine cancer Maternal Grandmother 73   Ovarian cancer Cousin        before age 46   Colon cancer Neg Hx    Colon polyps Neg Hx    Inflammatory bowel disease Neg Hx     Social History   Tobacco Use   Smoking status: Never   Smokeless tobacco: Never  Vaping Use   Vaping Use: Never used  Substance Use Topics   Alcohol use: Yes    Alcohol/week: 2.0 - 3.0 standard drinks    Types: 2 - 3 Glasses of wine per week   Drug use: No    Home Medications Prior to Admission medications   Medication Sig Start Date End Date Taking? Authorizing Provider  acetaminophen (TYLENOL) 500 MG tablet Take 1 tablet (500 mg total) by mouth every 6 (six) hours as needed for mild pain or fever. 09/27/20   Johnson, Clanford L, MD  amLODipine (NORVASC) 2.5 MG tablet Take 1 tablet (2.5 mg total) by mouth daily. For BP 12/09/20   Ladell Pier, MD  citalopram (CELEXA) 20 MG tablet Take 1 tablet (20 mg total) by mouth daily. 01/27/21    Elsie Stain, MD  dicyclomine (BENTYL) 10 MG capsule Take 10 mg by mouth 2 (two) times daily. 11/04/20   [provider]  donepezil (ARICEPT) 10 MG tablet Take 0.5-1 tablets (5-10 mg total) by mouth in the morning and at bedtime. 01/27/21   Elsie Stain, MD  gabapentin (NEURONTIN) 300 MG capsule Take 1 capsule (300 mg total) by mouth 2 (two) times daily. 01/27/21   Elsie Stain, MD  lactose free nutrition (BOOST) LIQD Take 237 mLs by mouth 2 (two) times daily.    [provider]  ondansetron (ZOFRAN ODT)  8 MG disintegrating tablet Take 1 tablet (8 mg total) by mouth every 8 (eight) hours as needed for nausea or vomiting. 01/27/21   Elsie Stain, MD  pantoprazole (PROTONIX) 40 MG tablet Take 1 tablet (40 mg total) by mouth 2 (two) times daily. 01/27/21 02/26/21  Elsie Stain, MD  sucralfate (CARAFATE) 1 GM/10ML suspension Take 10 mLs (1 g total) by mouth 4 (four) times daily -  with meals and at bedtime. 01/27/21   Elsie Stain, MD  tiZANidine (ZANAFLEX) 4 MG tablet Take 1 tablet (4 mg total) by mouth every 8 (eight) hours as needed for muscle spasms. 01/27/21   Elsie Stain, MD  traMADol (ULTRAM) 50 MG tablet Take 1 tablet (50 mg total) by mouth every 8 (eight) hours as needed. 01/27/21   Elsie Stain, MD  traZODone (DESYREL) 100 MG tablet Take 1 tablet (100 mg total) by mouth at bedtime. 01/27/21   Elsie Stain, MD    Allergies    Wheat extract and Ketamine  Review of Systems   Review of Systems  All other systems reviewed and are negative. Ten systems reviewed and are negative for acute change, except as noted in the HPI.   Physical Exam Updated Vital Signs BP (!) 167/74   Pulse 72   Temp 98.9 F (37.2 C) (Oral)   Resp 19   Ht 5\' 4"  (1.626 m)   Wt 81.6 kg   SpO2 96%   BMI 30.90 kg/m   Physical Exam Vitals and nursing note reviewed.  Constitutional:      General: She is not in acute distress.    Appearance: Normal appearance. She is  not ill-appearing, toxic-appearing or diaphoretic.  HENT:     Head: Normocephalic and atraumatic.     Right Ear: External ear normal.     Left Ear: External ear normal.     Nose: Nose normal.     Mouth/Throat:     Mouth: Mucous membranes are moist.     Pharynx: Oropharynx is clear. No oropharyngeal exudate or posterior oropharyngeal erythema.  Eyes:     Extraocular Movements: Extraocular movements intact.  Cardiovascular:     Rate and Rhythm: Normal rate and regular rhythm.     Pulses: Normal pulses.     Heart sounds: Normal heart sounds. No murmur heard.   No friction rub. No gallop.  Pulmonary:     Effort: Pulmonary effort is normal. No respiratory distress.     Breath sounds: Normal breath sounds. No stridor. No wheezing, rhonchi or rales.  Abdominal:     General: Abdomen is flat.     Tenderness: There is no abdominal tenderness.  Musculoskeletal:        General: Normal range of motion.     Cervical back: Normal range of motion and neck supple. No tenderness.  Skin:    General: Skin is warm and dry.  Neurological:     General: No focal deficit present.     Mental Status: She is alert and oriented to person, place, and time.  Psychiatric:        Attention and Perception: Attention normal. She is attentive.        Mood and Affect: Mood is depressed. Affect is flat.        Speech: Speech is delayed.        Behavior: Behavior is slowed. Behavior is not agitated or aggressive.        Thought Content: Thought content is delusional. Thought  content is not paranoid. Thought content includes suicidal ideation. Thought content does not include homicidal ideation. Thought content includes suicidal plan. Thought content does not include homicidal plan.    ED Results / Procedures / Treatments   Labs (all labs ordered are listed, but only abnormal results are displayed) Labs Reviewed  CBG MONITORING, ED - Abnormal; Notable for the following components:      Result Value    Glucose-Capillary 119 (*)    All other components within normal limits  RESP PANEL BY RT-PCR (FLU A&B, COVID) ARPGX2  COMPREHENSIVE METABOLIC PANEL  ETHANOL  RAPID URINE DRUG SCREEN, HOSP PERFORMED  CBC WITH DIFFERENTIAL/PLATELET  ACETAMINOPHEN LEVEL  SALICYLATE LEVEL    EKG None  Radiology No results found.  Procedures Procedures   Medications Ordered in ED Medications - No data to display  ED Course  I have reviewed the triage vital signs and the nursing notes.  Pertinent labs & imaging results that were available during my care of the patient were reviewed by me and considered in my medical decision making (see chart for details).  Clinical Course as of 02/16/21 2215  Sun Feb 16, 2021  2206 Patient discussed with [facility] poison control.  Recommend basic labs including a BMP and ECG.  4-hour postingestion Tylenol.  If reassuring labs patient can likely be medically cleared. [LJ]    Clinical Course User Index [LJ] Rayna Sexton, PA-C   MDM Rules/Calculators/A&P                          Patient is a 65 year old female who presents to the emergency department after a purposeful ingestion of Aleve as well as tramadol in an attempt to end her life about 9 PM tonight.  Physical exam mostly reassuring.  She is a fatigued with some mild slurring of her speech but otherwise denies any chest pain, shortness of breath, abdominal pain, nausea, vomiting.  No respiratory distress.  She has some mild tingling around her lips.  A&O x3.  Hypertensive but otherwise her vital signs are within normal limits.  She was discussed with poison control who feel that this is likely not a significant ingestion.  They recommend basic labs including a BMP and ECG as well as a 4-hour postingestion Tylenol level.  If reassuring she can likely be medically cleared and evaluated by TTS.  It is the end of my shift and patient care is being transferred to Dr. Wyvonnia Dusky.  Lab work and ECG is pending.   Patient will also require an additional Tylenol level at 1 AM.  If this is reassuring and patient's symptoms are improving she can likely be medically cleared and will need TTS evaluation.  Patient has been involuntarily committed.  Final Clinical Impression(s) / ED Diagnoses Final diagnoses:  Suicide attempt [facility])   Rx / DC Orders ED Discharge Orders     None        Rayna Sexton, Hershal Coria 02/16/21 2222    Davonna Belling, MD 02/17/21 [phone]

## 2021-02-16 NOTE — ED Triage Notes (Signed)
Pt attempted suicide today an hour ago. Depressed due to chronic abd pain. Took 6 tramadol and 12 aleve per EMS report.   [facility] had called 911.

## 2021-02-17 ENCOUNTER — Emergency Department (HOSPITAL_COMMUNITY): Payer: Medicaid Other

## 2021-02-17 LAB — RAPID URINE DRUG SCREEN, HOSP PERFORMED
Amphetamines: NOT DETECTED
Barbiturates: NOT DETECTED
Benzodiazepines: NOT DETECTED
Cocaine: NOT DETECTED
Opiates: NOT DETECTED
Tetrahydrocannabinol: NOT DETECTED

## 2021-02-17 LAB — TROPONIN I (HIGH SENSITIVITY)
Troponin I (High Sensitivity): 3 ng/L (ref <18)
Troponin I (High Sensitivity): 3 ng/L (ref <18)

## 2021-02-17 LAB — ACETAMINOPHEN LEVEL
Acetaminophen (Tylenol), Serum: <10 ug/mL — ABNORMAL LOW (ref 10–30)
Acetaminophen (Tylenol), Serum: <10 ug/mL — ABNORMAL LOW (ref 10–30)

## 2021-02-17 MED ORDER — SUCRALFATE 1 GM/10ML PO SUSP
1.0000 g | Freq: Three times a day (TID) | ORAL | Status: DC
  Administered 2021-02-17 (×4): 1 g via ORAL
  Filled 2021-02-17 (×4): qty 10

## 2021-02-17 MED ORDER — CITALOPRAM HYDROBROMIDE 20 MG PO TABS
20.0000 mg | ORAL_TABLET | Freq: Every day | ORAL | Status: DC
  Administered 2021-02-17: 20 mg via ORAL
  Filled 2021-02-17: qty 1

## 2021-02-17 MED ORDER — AMLODIPINE BESYLATE 5 MG PO TABS
2.5000 mg | ORAL_TABLET | Freq: Every day | ORAL | Status: DC
  Administered 2021-02-17: 2.5 mg via ORAL
  Filled 2021-02-17: qty 1

## 2021-02-17 MED ORDER — PANTOPRAZOLE SODIUM 40 MG PO TBEC: 40.0000 mg | DELAYED_RELEASE_TABLET | Freq: Two times a day (BID) | ORAL | Status: DC

## 2021-02-17 MED ORDER — DICYCLOMINE HCL 10 MG PO CAPS
10.0000 mg | ORAL_CAPSULE | Freq: Two times a day (BID) | ORAL | Status: DC
  Administered 2021-02-17 (×2): 10 mg via ORAL
  Filled 2021-02-17 (×2): qty 1

## 2021-02-17 MED ORDER — DICYCLOMINE HCL 10 MG PO CAPS: 10.0000 mg | ORAL_CAPSULE | Freq: Two times a day (BID) | ORAL | Status: DC

## 2021-02-17 MED ORDER — GABAPENTIN 300 MG PO CAPS
300.0000 mg | ORAL_CAPSULE | Freq: Two times a day (BID) | ORAL | Status: DC
  Administered 2021-02-17 (×2): 300 mg via ORAL
  Filled 2021-02-17 (×2): qty 1

## 2021-02-17 MED ORDER — ACETAMINOPHEN 500 MG PO TABS
1000.0000 mg | ORAL_TABLET | Freq: Once | ORAL | Status: AC
  Administered 2021-02-17: 1000 mg via ORAL
  Filled 2021-02-17: qty 2

## 2021-02-17 MED ORDER — PANTOPRAZOLE SODIUM 40 MG PO TBEC
40.0000 mg | DELAYED_RELEASE_TABLET | Freq: Two times a day (BID) | ORAL | Status: DC
  Administered 2021-02-17 (×2): 40 mg via ORAL
  Filled 2021-02-17 (×2): qty 1

## 2021-02-17 NOTE — ED Notes (Signed)
824 ml of urine found on bladder scan

## 2021-02-17 NOTE — ED Notes (Addendum)
Spoke with Poison Control regarding follow up. Pt is outside window of concern. Dr. Wyvonnia Dusky notified.

## 2021-02-17 NOTE — ED Notes (Signed)
Pt reports left sided chest pain that started "all of a sudden." EKG performed and Dr. Rogene Houston made aware.

## 2021-02-17 NOTE — ED Notes (Signed)
Pt has been on external catheter and reports she cannot urinate. Pt to bathroom with sitter ambulatory with cane.

## 2021-02-17 NOTE — BH Assessment (Signed)
Attempting to SecureChat pt's RN to setup TTS assessment. No response as yet.

## 2021-02-17 NOTE — Progress Notes (Signed)
Patient has been referred out due to no geri psych beds available at [facility]. Patient meets inpatient criteria per [facility] Bobbitt,NP. Patient referred to the following facilities:  [facility]  [address] [place] [address] [place]  [facility]  [address] [place] [address] [phone] [place]  [address] [place] [address] [facility]  [address] [place] [address] [phone] [place], [place] [address] [phone]  [facility]  [address], [place] [address] [phone]  [facility]  [address] [place], [place] [address] [phone] [facility]  [place], [place] [address] [phone] [facility]  [address] [place] [address] [phone]  [facility]  [address] [place] [address] [phone] [place]  [address] [place] [address] [phone] [place]  [address] [place] [address] [phone] [facility]  [place], [place] [address] [phone]  [facility]  [address], [place] [address] [phone]  [facility]  [address], [place] [address] [phone] [facility]  [address] [place] [address] [phone]    CSW will continue to monitor disposition.    Mariea Clonts, MSW, LCSW-A  10:43 AM 02/17/2021

## 2021-02-17 NOTE — ED Notes (Signed)
Bladder Scan reads 358

## 2021-02-17 NOTE — ED Notes (Signed)
Patient provided phone per request to call and cancel doctors appointment

## 2021-02-17 NOTE — ED Notes (Signed)
Pt is requesting pain medication. 

## 2021-02-17 NOTE — ED Provider Notes (Signed)
Care assumed from [facility].  Patient with chronic abdominal pain here with ingestion of Aleve and tramadol around 9 PM.  She is awaiting repeat acetaminophen level at 1 AM  Acetaminophen level is undetectable.  She is medically clear for TTS evaluation.  Holding orders placed.    Ezequiel Essex, MD 02/17/21 [phone]

## 2021-02-17 NOTE — ED Notes (Signed)
Pt unable to urinate, reported to Dr. Wyvonnia Dusky.
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
## 2021-02-17 NOTE — ED Provider Notes (Addendum)
Patient with a complaint of left anterior chest pain started about an hour ago.  EKG has been ordered without any acute changes.  Will order troponins.  And repeat chest x-ray.   Fredia Sorrow, MD 02/17/21 1828  Patient's work-up for the chest pain.  Troponins x2 are normal.  Chest x-ray without any acute findings.  No evidence of any acute cardiac event.    Fredia Sorrow, MD 02/18/21 0002

## 2021-02-18 ENCOUNTER — Telehealth: Payer: Self-pay | Admitting: Internal Medicine

## 2021-02-18 DIAGNOSIS — F332 Major depressive disorder, recurrent severe without psychotic features: Secondary | ICD-10-CM | POA: Insufficient documentation

## 2021-02-18 MED ORDER — TRAMADOL HCL 50 MG PO TABS
50.0000 mg | ORAL_TABLET | Freq: Three times a day (TID) | ORAL | Status: DC | PRN
Start: 2021-02-18 — End: 2021-02-18
  Administered 2021-02-18: 50 mg via ORAL
  Filled 2021-02-18: qty 1

## 2021-02-18 MED ORDER — TRAZODONE HCL 50 MG PO TABS
100.0000 mg | ORAL_TABLET | Freq: Every day | ORAL | Status: DC
  Administered 2021-02-18: 100 mg via ORAL
  Filled 2021-02-18: qty 2

## 2021-02-18 NOTE — ED Notes (Signed)
Pt leaving with [employer] [employer] to transport to [place].

## 2021-02-18 NOTE — ED Notes (Signed)
Pt resting in bed, eyes closed, slow, even respirations noted

## 2021-02-18 NOTE — ED Notes (Signed)
Pt very upset she is in the hallway; pt attempting to leave; informed pt she is not the only pt in the hallway and she has been IVC'd and cannot leave; pt laid back down but states she is leaving

## 2021-02-18 NOTE — Telephone Encounter (Signed)
RECALL FOR MRI 

## 2021-02-18 NOTE — ED Notes (Signed)
Received call from [facility] from [facility]. Pt has accepting bed. Accepting MD is Morrell Riddle, MD.  Address: [address], [place], [place] [address]  Number for report: [phone]  Bed assignment: 017 bed 2

## 2021-02-18 NOTE — Telephone Encounter (Signed)
Maria Murphy had requested MRI when d/c from hospital 09/2020. Since then she had MRI 12/2020. Does she need MRI at this time?

## 2021-02-18 NOTE — ED Notes (Signed)
Called and report given to Amy RN

## 2021-02-18 NOTE — ED Notes (Signed)
Pt stating her abdomen is hurting and she usually takes tramadol; EDP made aware and orders placed and carried out, see [facility]

## 2021-02-18 NOTE — ED Provider Notes (Signed)
Patient has been accepted at [facility].  Excepting physician is Morrell Riddle.   Delora Fuel, MD [birth year] [phone]

## 2021-02-19 DIAGNOSIS — I82409 Acute embolism and thrombosis of unspecified deep veins of unspecified lower extremity: Secondary | ICD-10-CM | POA: Insufficient documentation

## 2021-02-19 NOTE — Telephone Encounter (Signed)
noted 

## 2021-02-19 NOTE — Telephone Encounter (Signed)
No repeat right now. She is actually inpatient now for other reasons.

## 2021-02-25 ENCOUNTER — Ambulatory Visit: Payer: Medicaid Other | Admitting: Internal Medicine

## 2021-02-25 ENCOUNTER — Encounter: Payer: Self-pay | Admitting: Internal Medicine

## 2021-03-11 ENCOUNTER — Ambulatory Visit
Admission: EM | Admit: 2021-03-11 | Discharge: 2021-03-11 | Disposition: A | Discharge status: Home / Self Care | Payer: Medicaid Other | Attending: Urgent Care | Admitting: Urgent Care

## 2021-03-11 ENCOUNTER — Other Ambulatory Visit: Payer: Self-pay

## 2021-03-11 ENCOUNTER — Encounter: Payer: Self-pay | Admitting: Emergency Medicine

## 2021-03-11 DIAGNOSIS — A084 Viral intestinal infection, unspecified: Secondary | ICD-10-CM

## 2021-03-11 DIAGNOSIS — R112 Nausea with vomiting, unspecified: Secondary | ICD-10-CM

## 2021-03-11 MED ORDER — ONDANSETRON HCL 4 MG/2ML IJ SOLN
4.0000 mg | Freq: Once | INTRAMUSCULAR | Status: AC
  Administered 2021-03-11: 4 mg via INTRAMUSCULAR

## 2021-03-11 MED ORDER — LOPERAMIDE HCL 2 MG PO CAPS: 2.0000 mg | ORAL_CAPSULE | Freq: Two times a day (BID) | ORAL | 0 refills | Status: DC | PRN

## 2021-03-11 MED ORDER — ONDANSETRON 8 MG PO TBDP: 8.0000 mg | ORAL_TABLET | Freq: Three times a day (TID) | ORAL | 0 refills | Status: DC | PRN

## 2021-03-11 NOTE — ED Provider Notes (Addendum)
[place]   MRN: [id number] DOB: 02/24/1956  Subjective:   Maria Murphy is a 65 y.o. female presenting for 1 day history of acute onset nausea, vomiting, diarrhea.  She has had some mild occasional dizziness.  Denies fever, chills, body aches, chest pain, shortness of breath, sore throat, hematemesis, bloody stools.  Patient was recently in the hospital for 7 days for mental health, major depressive disorder.  Denies any recent long distance travel, recent antibiotic use.  She does have a history of a peptic ulcer with perforation but this feels different and she does not have any particular pain.  No current facility-administered medications for this encounter.  Current Outpatient Medications:    acetaminophen (TYLENOL) 500 MG tablet, Take 1 tablet (500 mg total) by mouth every 6 (six) hours as needed for mild pain or fever., Disp: , Rfl:    amLODipine (NORVASC) 2.5 MG tablet, Take 1 tablet (2.5 mg total) by mouth daily. For BP, Disp: 90 tablet, Rfl: 3   citalopram (CELEXA) 20 MG tablet, Take 1 tablet (20 mg total) by mouth daily., Disp: 60 tablet, Rfl: 3   dicyclomine (BENTYL) 10 MG capsule, Take 10 mg by mouth 2 (two) times daily. (Patient not taking: Reported on 02/17/2021), Disp: , Rfl:    donepezil (ARICEPT) 10 MG tablet, Take 0.5-1 tablets (5-10 mg total) by mouth in the morning and at bedtime., Disp: 30 tablet, Rfl: 2   gabapentin (NEURONTIN) 300 MG capsule, Take 1 capsule (300 mg total) by mouth 2 (two) times daily., Disp: 60 capsule, Rfl: 6   lactose free nutrition (BOOST) LIQD, Take 237 mLs by mouth 2 (two) times daily., Disp: , Rfl:    ondansetron (ZOFRAN ODT) 8 MG disintegrating tablet, Take 1 tablet (8 mg total) by mouth every 8 (eight) hours as needed for nausea or vomiting., Disp: 30 tablet, Rfl: 1   pantoprazole (PROTONIX) 40 MG tablet, Take 1 tablet (40 mg total) by mouth 2 (two) times daily., Disp: 60 tablet, Rfl: 6   sucralfate (CARAFATE) 1 GM/10ML  suspension, Take 10 mLs (1 g total) by mouth 4 (four) times daily -  with meals and at bedtime., Disp: 420 mL, Rfl: 2   tiZANidine (ZANAFLEX) 4 MG tablet, Take 1 tablet (4 mg total) by mouth every 8 (eight) hours as needed for muscle spasms., Disp: 30 tablet, Rfl: 0   traMADol (ULTRAM) 50 MG tablet, Take 1 tablet (50 mg total) by mouth every 8 (eight) hours as needed., Disp: 60 tablet, Rfl: 0   traZODone (DESYREL) 100 MG tablet, Take 1 tablet (100 mg total) by mouth at bedtime., Disp: 30 tablet, Rfl: 1   Allergies  Allergen Reactions   Wheat Extract Swelling   Ketamine Other (See Comments)    Agitation, confusion, hypertension, tachycardia    Past Medical History:  Diagnosis Date   Acute saddle pulmonary embolism without acute cor pulmonale (HCC)    Basal cell carcinoma 01/16/2021   L nose   Collagen vascular disease ([place])    Duodenal ulcer    Essential hypertension, benign 01/24/2015   Family history of ovarian cancer 01/06/2021   Family history of pancreatic cancer 01/06/2021   Family history of uterine cancer 01/06/2021   Foot ulcer ([place])    GERD (gastroesophageal reflux disease)    Hypertension    Memory loss    Peptic ulcer with perforation ([place])    Perforated abdominal viscus 04/22/2020   PUD (peptic ulcer disease)    with perforation s/p exploratory laparotomy  Pulmonary embolus ([place]) 04/2020   Skin cancer    Suicidal ideations 08/07/2020   Vision abnormalities      Past Surgical History:  Procedure Laterality Date   BIOPSY  11/20/2020   Procedure: BIOPSY;  Surgeon: Rush Landmark Telford Nab., MD;  Location: [facility];  Service: Gastroenterology;;   CENTRAL LINE INSERTION Right 04/22/2020   Procedure: CENTRAL LINE INSERTION;  Surgeon: Virl Cagey, MD;  Location: [facility];  Service: General;  Laterality: Right;   DIAGNOSTIC LAPAROSCOPY     ESOPHAGOGASTRODUODENOSCOPY N/A 11/20/2020   Procedure: ESOPHAGOGASTRODUODENOSCOPY (EGD);  Surgeon: Irving Copas.,  MD;  Location: [facility];  Service: Gastroenterology;  Laterality: N/A;   ESOPHAGOGASTRODUODENOSCOPY (EGD) WITH PROPOFOL N/A 07/10/2020   Procedure: ESOPHAGOGASTRODUODENOSCOPY (EGD) WITH PROPOFOL;  Surgeon: Rogene Houston, MD;  [facility];  Service: Endoscopy;  Laterality: N/A;   ESOPHAGOGASTRODUODENOSCOPY (EGD) WITH PROPOFOL N/A 10/18/2020   Procedure: ESOPHAGOGASTRODUODENOSCOPY (EGD) WITH PROPOFOL;  Surgeon: Harvel Quale, MD;  Location: [facility];  Service: Gastroenterology;  Laterality: N/A;   EUS N/A 11/20/2020   Procedure: UPPER ENDOSCOPIC ULTRASOUND (EUS) LINEAR;  Surgeon: Irving Copas., MD;  Location: [facility];  Service: Gastroenterology;  Laterality: N/A;   GASTRORRHAPHY  04/22/2020   Procedure: GASTRORRHAPHY;  Surgeon: Virl Cagey, MD;  Location: [facility];  Service: General;;   IR CATHETER TUBE CHANGE  05/09/2020   LAPAROTOMY N/A 04/22/2020   Procedure: EXPLORATORY LAPAROTOMY;  Surgeon: Virl Cagey, MD;  Location: [facility];  Service: General;  Laterality: N/A;   TONSILLECTOMY      Family History  Problem Relation Age of Onset   High Cholesterol Mother    Hypertension Mother    Arthritis/Rheumatoid Mother    Pancreatic cancer Mother 26   Alcohol abuse Father    Hypertension Maternal Grandmother    Uterine cancer Maternal Grandmother 50   Ovarian cancer Cousin        before age 57   Colon cancer Neg Hx    Colon polyps Neg Hx    Inflammatory bowel disease Neg Hx     Social History   Tobacco Use   Smoking status: Never   Smokeless tobacco: Never  Vaping Use   Vaping Use: Never used  Substance Use Topics   Alcohol use: Yes    Alcohol/week: 2.0 - 3.0 standard drinks    Types: 2 - 3 Glasses of wine per week   Drug use: No    ROS   Objective:   Vitals: BP (!) 155/88 (BP Location: Right Arm)   Pulse (!) 59   Temp 98 F (36.7 C) (Oral)   Resp 14   SpO2 96%   Physical Exam Constitutional:      General: She  is not in acute distress.    Appearance: Normal appearance. She is well-developed and normal weight. She is not ill-appearing, toxic-appearing or diaphoretic.  HENT:     Head: Normocephalic and atraumatic.     Right Ear: External ear normal.     Left Ear: External ear normal.     Nose: Nose normal.     Mouth/Throat:     Mouth: Mucous membranes are moist.     Pharynx: Oropharynx is clear.  Eyes:     General: No scleral icterus.    Extraocular Movements: Extraocular movements intact.     Pupils: Pupils are equal, round, and reactive to light.  Cardiovascular:     Rate and Rhythm: Normal rate and regular rhythm.  Heart sounds: Normal heart sounds. No murmur heard.   No friction rub. No gallop.  Pulmonary:     Effort: Pulmonary effort is normal. No respiratory distress.     Breath sounds: Normal breath sounds. No stridor. No wheezing, rhonchi or rales.  Abdominal:     General: Bowel sounds are normal. There is no distension.     Palpations: Abdomen is soft. There is no mass.     Tenderness: There is no abdominal tenderness. There is no right CVA tenderness, left CVA tenderness, guarding or rebound.  Skin:    General: Skin is warm and dry.     Coloration: Skin is not pale.     Findings: No rash.  Neurological:     General: No focal deficit present.     Mental Status: She is alert and oriented to person, place, and time.  Psychiatric:        Mood and Affect: Mood normal.        Behavior: Behavior normal.        Thought Content: Thought content normal.        Judgment: Judgment normal.    IM Zofran in clinic.  Assessment and Plan :   PDMP not reviewed this encounter.  1. Viral gastroenteritis   2. Nausea vomiting and diarrhea     Will manage for suspected viral gastroenteritis with supportive care.  Recommended patient hydrate well, eat light meals and maintain electrolytes.  Will use Zofran and Imodium for nausea, vomiting and diarrhea.  COVID-19 testing pending.   Counseled patient on potential for adverse effects with medications prescribed/recommended today, ER and return-to-clinic precautions discussed, patient verbalized understanding.    Jaynee Eagles, PA-C 03/11/21 1521

## 2021-03-11 NOTE — Discharge Instructions (Addendum)

## 2021-03-11 NOTE — ED Triage Notes (Addendum)
Dizziness, vomiting and diarrhea that started yesterday.  Pt taking zofran with no relief.

## 2021-03-12 LAB — NOVEL CORONAVIRUS, NAA: SARS-CoV-2, NAA: NOT DETECTED

## 2021-03-12 LAB — SARS-COV-2, NAA 2 DAY TAT

## 2021-03-19 NOTE — Progress Notes (Signed)
Patient ID: Maria Murphy, female   DOB: 1956/06/23, 65 y.o.   MRN: [id number]    Maria Murphy, is a 65 y.o. female  [id number]  [id number]  DOB - 02-24-1956  Subjective:  Chief Complaint and HPI: Maria Murphy is a 65 y.o. female here today for a follow up visit After ED visit 03/11/2021 for viral gastroenteritis.  She also had a recent suicide attempt in June by taking 70 aleve and 6 tramadol stating that she felt like a burden to her family.  She has been having follow up counseling at [facility]. She denies any current SI.  She is feeling better and more hopeful.  The gastroeneteritis resolved but she continue to have ongoing GI issues that are longstanding.  She needs her monthly RF of tamadol and has a contract for this with our clinic.  Hyperglycemia with A1C=4.9 in June.    From ED A/P: 1. Viral gastroenteritis  2. Nausea vomiting and diarrhea       Will manage for suspected viral gastroenteritis with supportive care.  Recommended patient hydrate well, eat light meals and maintain electrolytes.  Will use Zofran and Imodium for nausea, vomiting and diarrhea.  COVID-19 testing pending.  Counseled patient on potential for adverse effects with medications prescribed/recommended today, ER and return-to-clinic precautions discussed, patient verbalized understanding.  ED/Hospital notes reviewed.    ROS:   Constitutional:  No f/c, No night sweats, No unexplained weight loss. EENT:  No vision changes, No blurry vision, No hearing changes. No mouth, throat, or ear problems.  Respiratory: No cough, No SOB Cardiac: No CP, no palpitations GI:  longstanding GU: No Urinary s/sx Musculoskeletal: No joint pain Neuro: No headache, no dizziness, no motor weakness.  Skin: No rash Endocrine:  No polydipsia. No polyuria.  Psych: Denies SI/HI  No problems updated.  ALLERGIES: Allergies  Allergen Reactions   Wheat Extract Swelling   Ketamine Other (See Comments)    Agitation,  confusion, hypertension, tachycardia    PAST MEDICAL HISTORY: Past Medical History:  Diagnosis Date   Acute saddle pulmonary embolism without acute cor pulmonale (HCC)    Basal cell carcinoma 01/16/2021   L nose   Collagen vascular disease ([place])    Duodenal ulcer    Essential hypertension, benign 01/24/2015   Family history of ovarian cancer 01/06/2021   Family history of pancreatic cancer 01/06/2021   Family history of uterine cancer 01/06/2021   Foot ulcer ([place])    GERD (gastroesophageal reflux disease)    Hypertension    Memory loss    Peptic ulcer with perforation ([place])    Perforated abdominal viscus 04/22/2020   PUD (peptic ulcer disease)    with perforation s/p exploratory laparotomy   Pulmonary embolus ([place]) 04/2020   Skin cancer    Suicidal ideations 08/07/2020   Vision abnormalities     MEDICATIONS AT HOME: Prior to Admission medications   Medication Sig Start Date End Date Taking? Authorizing Provider  acetaminophen (TYLENOL) 500 MG tablet Take 1 tablet (500 mg total) by mouth every 6 (six) hours as needed for mild pain or fever. 09/27/20  Yes Johnson, Clanford L, MD  amLODipine (NORVASC) 2.5 MG tablet Take 1 tablet (2.5 mg total) by mouth daily. For BP 12/09/20  Yes Ladell Pier, MD  citalopram (CELEXA) 20 MG tablet Take 1 tablet (20 mg total) by mouth daily. 01/27/21  Yes Elsie Stain, MD  dicyclomine (BENTYL) 10 MG capsule Take 10 mg by mouth 2 (two) times daily. 11/04/20  Yes  [provider]  donepezil (ARICEPT) 10 MG tablet Take 0.5-1 tablets (5-10 mg total) by mouth in the morning and at bedtime. 01/27/21  Yes Elsie Stain, MD  gabapentin (NEURONTIN) 300 MG capsule Take 1 capsule (300 mg total) by mouth 2 (two) times daily. 01/27/21  Yes Elsie Stain, MD  lactose free nutrition (BOOST) LIQD Take 237 mLs by mouth 2 (two) times daily.   Yes [provider]  loperamide (IMODIUM) 2 MG capsule Take 1 capsule (2 mg total) by mouth 2 (two)  times daily as needed for diarrhea or loose stools. 03/11/21  Yes Jaynee Eagles, PA-C  ondansetron (ZOFRAN-ODT) 8 MG disintegrating tablet Take 1 tablet (8 mg total) by mouth every 8 (eight) hours as needed for nausea or vomiting. 03/11/21  Yes Jaynee Eagles, PA-C  sucralfate (CARAFATE) 1 GM/10ML suspension Take 10 mLs (1 g total) by mouth 4 (four) times daily -  with meals and at bedtime. 01/27/21  Yes Elsie Stain, MD  tiZANidine (ZANAFLEX) 4 MG tablet Take 1 tablet (4 mg total) by mouth every 8 (eight) hours as needed for muscle spasms. 01/27/21  Yes Elsie Stain, MD  traZODone (DESYREL) 100 MG tablet Take 1 tablet (100 mg total) by mouth at bedtime. 01/27/21  Yes Elsie Stain, MD  pantoprazole (PROTONIX) 40 MG tablet Take 1 tablet (40 mg total) by mouth 2 (two) times daily. 01/27/21 02/26/21  Elsie Stain, MD  traMADol (ULTRAM) 50 MG tablet Take 1 tablet (50 mg total) by mouth every 8 (eight) hours as needed. 03/20/21   Argentina Donovan, PA-C     Objective:  EXAM:   Vitals:   03/20/21 0846  BP: 132/82  Pulse: 71  SpO2: 99%  Weight: 160 lb 6.4 oz (72.8 kg)  Height: '5\' 4"'$  (1.626 m)    General appearance : A&OX3. NAD. Non-toxic-appearing; dressed in a color coordinated outfit in pink  HEENT: Atraumatic and Normocephalic.  PERRLA. EOM intact.  Chest/Lungs:  Breathing-non-labored, Good air entry bilaterally, breath sounds normal without rales, rhonchi, or wheezing  CVS: S1 S2 regular, no murmurs, gallops, rubs  Extremities: Bilateral Lower Ext shows no edema, both legs are warm to touch with = pulse throughout Neurology:  CN II-XII grossly intact, Non focal.   Psych:  TP linear. J/I fair. Normal speech. Appropriate eye contact and cheerful affect.  Skin:  No Rash  Depression screen [facility] 2/9 03/20/2021 02/17/2021 12/09/2020  Decreased Interest '3 2 3  '$ Down, Depressed, Hopeless '1 2 3  '$ PHQ - 2 Score '4 4 6  '$ Altered sleeping '2 3 3  '$ Tired, decreased energy '2 2 3  '$ Change in appetite '2 2 1   '$ Feeling bad or failure about yourself  '1 2 3  '$ Trouble concentrating '2 2 2  '$ Moving slowly or fidgety/restless 2 2 -  Suicidal thoughts 0 3 3  PHQ-9 Score '15 20 21  '$ Difficult doing work/chores - Very difficult -  Some encounter information is confidential and restricted. Go to Review Flowsheets activity to see all data.  Some recent data might be hidden     Data Review No results found for: HGBA1C   Assessment & Plan   1. Essential hypertension Continue amlodipine  2. Viral gastroenteritis Resolved-missed GI appt but has info to call and reschedule  3. Hospital discharge follow-up improving  4. Suicidal ideations None currently.  Seen at [facility] for counseling.  PHQ9 #9=0 - Ambulatory referral to Social Work  5. Chronic abdominal pain Has  contract fot 60 per month-RF to be addressed to her PCP - traMADol (ULTRAM) 50 MG tablet; Take 1 tablet (50 mg total) by mouth every 8 (eight) hours as needed.  Dispense: 60 tablet; Refill: 0     Patient have been counseled extensively about nutrition and exercise  Return in about 2 months (around 05/21/2021) for with PCP;  chronic conditions.  The patient was given clear instructions to go to ER or return to medical center if symptoms don't improve, worsen or new problems develop. The patient verbalized understanding. The patient was told to call to get lab results if they haven't heard anything in the next week.     Freeman Caldron, PA-C [facility] [place], [place]   03/20/2021, 9:01 AM

## 2021-03-20 ENCOUNTER — Other Ambulatory Visit: Payer: Self-pay

## 2021-03-20 ENCOUNTER — Ambulatory Visit: Payer: Medicaid Other | Attending: Physician Assistant | Admitting: Physician Assistant

## 2021-03-20 VITALS — BP 132/82 | HR 71 | Ht 64.0 in | Wt 160.4 lb

## 2021-03-20 DIAGNOSIS — G8929 Other chronic pain: Secondary | ICD-10-CM | POA: Diagnosis not present

## 2021-03-20 DIAGNOSIS — A084 Viral intestinal infection, unspecified: Secondary | ICD-10-CM | POA: Insufficient documentation

## 2021-03-20 DIAGNOSIS — I10 Essential (primary) hypertension: Secondary | ICD-10-CM | POA: Insufficient documentation

## 2021-03-20 DIAGNOSIS — Z9151 Personal history of suicidal behavior: Secondary | ICD-10-CM | POA: Diagnosis not present

## 2021-03-20 DIAGNOSIS — Z09 Encounter for follow-up examination after completed treatment for conditions other than malignant neoplasm: Secondary | ICD-10-CM | POA: Diagnosis not present

## 2021-03-20 DIAGNOSIS — R45851 Suicidal ideations: Secondary | ICD-10-CM | POA: Insufficient documentation

## 2021-03-20 DIAGNOSIS — R109 Unspecified abdominal pain: Secondary | ICD-10-CM

## 2021-03-20 MED ORDER — TRAMADOL HCL 50 MG PO TABS: 50.0000 mg | ORAL_TABLET | Freq: Three times a day (TID) | ORAL | 0 refills | Status: DC | PRN

## 2021-03-20 NOTE — Progress Notes (Signed)
Refill on tramadol

## 2021-04-07 ENCOUNTER — Other Ambulatory Visit: Payer: Self-pay | Admitting: Internal Medicine

## 2021-04-07 ENCOUNTER — Encounter: Payer: Self-pay | Admitting: Emergency Medicine

## 2021-04-07 ENCOUNTER — Ambulatory Visit
Admission: EM | Admit: 2021-04-07 | Discharge: 2021-04-07 | Disposition: A | Discharge status: Home / Self Care | Payer: Medicaid Other

## 2021-04-07 ENCOUNTER — Other Ambulatory Visit: Payer: Self-pay

## 2021-04-07 DIAGNOSIS — G8929 Other chronic pain: Secondary | ICD-10-CM | POA: Diagnosis not present

## 2021-04-07 DIAGNOSIS — R109 Unspecified abdominal pain: Secondary | ICD-10-CM

## 2021-04-07 MED ORDER — TIZANIDINE HCL 4 MG PO TABS: 4.0000 mg | ORAL_TABLET | Freq: Three times a day (TID) | ORAL | 0 refills | Status: DC | PRN

## 2021-04-07 NOTE — Discharge Instructions (Addendum)
Keep follow-up with PCP. Go to the ER if symptoms get worse.

## 2021-04-07 NOTE — Telephone Encounter (Signed)
  Future visit scheduled: yes   Notes to clinic:  Patient has appt on 04/10/2021 Review for refill    Requested Prescriptions  Pending Prescriptions Disp Refills   tiZANidine (ZANAFLEX) 4 MG tablet 30 tablet 0    Sig: Take 1 tablet (4 mg total) by mouth every 8 (eight) hours as needed for muscle spasms.     Not Delegated - Cardiovascular:  Alpha-2 Agonists - tizanidine Failed - 04/07/2021 10:35 AM      Failed - This refill cannot be delegated      Passed - Valid encounter within last 6 months    Recent Outpatient Visits           2 weeks ago Essential hypertension   [place], [place]   2 months ago Calculus of gallbladder without cholecystitis without obstruction   [place], [place]   3 months ago Hospital discharge follow-up   [place], [place]   5 months ago Hospital discharge follow-up   [place], [place]   6 months ago Hospital discharge follow-up   [place], [place]       Future Appointments             In 3 days Thereasa Solo, Angela M, [place]   In 2 weeks Ralene Bathe, MD [place]

## 2021-04-07 NOTE — ED Provider Notes (Signed)
[facility]    CSN: [id number] Arrival date & time: 04/07/21  1734      History   Chief Complaint Chief Complaint  Patient presents with   Flank Pain    HPI Maria Murphy is a 65 y.o. female.   HPI Patient with a history of chronic abdominal pain, hypertension, mental health disorder presents today complaining of right abdominal pain. She has chronic pain. She is scheduled to follow-up with PCP follow-up. Denies nausea, vomiting, or diarrhea. She has been referred to GI. Past Medical History:  Diagnosis Date   Acute saddle pulmonary embolism without acute cor pulmonale (HCC)    Basal cell carcinoma 01/16/2021   L nose   Collagen vascular disease (HCC)    Duodenal ulcer    Essential hypertension, benign 01/24/2015   Family history of ovarian cancer 01/06/2021   Family history of pancreatic cancer 01/06/2021   Family history of uterine cancer 01/06/2021   Foot ulcer ([place])    GERD (gastroesophageal reflux disease)    Hypertension    Memory loss    Peptic ulcer with perforation ([place])    Perforated abdominal viscus 04/22/2020   PUD (peptic ulcer disease)    with perforation s/p exploratory laparotomy   Pulmonary embolus ([place]) 04/2020   Skin cancer    Suicidal ideations 08/07/2020   Vision abnormalities     Patient Active Problem List   Diagnosis Date Noted   Genetic testing 01/14/2021   Family history of pancreatic cancer 01/06/2021   Family history of ovarian cancer 01/06/2021   Family history of uterine cancer 01/06/2021   IDA (iron deficiency anemia) 12/25/2020   Opioid contract exists 12/09/2020   Pancreatic lesion    Chronic abdominal pain    History of DVT (deep vein thrombosis) 11/15/2020   Uncontrolled pain 11/15/2020   Anemia    History of pulmonary embolus (PE) 10/18/2020   Obesity (BMI 30-39.9) 10/18/2020   GERD (gastroesophageal reflux disease) 10/18/2020   Pancreatic mass 10/09/2020   Acquired complex cyst of kidney 10/09/2020    Basal cell carcinoma (BCC) of left side of nose 10/09/2020   Aortic atherosclerosis ([place]) 10/09/2020   Peripheral polyneuropathy 10/09/2020   Dehydration    Chronic diarrhea 09/24/2020   Diarrhea    Cholelithiasis 09/05/2020   Primary osteoarthritis of both knees 07/24/2020   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   Essential hypertension 01/24/2015   Memory loss 01/24/2015   Neck pain 01/24/2015   Depression 01/24/2015    Past Surgical History:  Procedure Laterality Date   BIOPSY  11/20/2020   Procedure: BIOPSY;  Surgeon: Rush Landmark Telford Nab., MD;  Location: [facility];  Service: Gastroenterology;;   CENTRAL LINE INSERTION Right 04/22/2020   Procedure: CENTRAL LINE INSERTION;  Surgeon: Virl Cagey, MD;  Location: [facility];  Service: General;  Laterality: Right;   DIAGNOSTIC LAPAROSCOPY     ESOPHAGOGASTRODUODENOSCOPY N/A 11/20/2020   Procedure: ESOPHAGOGASTRODUODENOSCOPY (EGD);  Surgeon: Irving Copas., MD;  Location: [facility];  Service: Gastroenterology;  Laterality: N/A;   ESOPHAGOGASTRODUODENOSCOPY (EGD) WITH PROPOFOL N/A 07/10/2020   Procedure: ESOPHAGOGASTRODUODENOSCOPY (EGD) WITH PROPOFOL;  Surgeon: Rogene Houston, MD;  [facility];  Service: Endoscopy;  Laterality: N/A;   ESOPHAGOGASTRODUODENOSCOPY (EGD) WITH PROPOFOL N/A 10/18/2020   Procedure: ESOPHAGOGASTRODUODENOSCOPY (EGD) WITH PROPOFOL;  Surgeon: Harvel Quale, MD;  Location[facility];  Service: Gastroenterology;  Laterality: N/A;   EUS N/A 11/20/2020   Procedure: UPPER ENDOSCOPIC ULTRASOUND (EUS) LINEAR;  Surgeon: Irving Copas., MD;  Location: [facility];  Service: Gastroenterology;  Laterality: N/A;   GASTRORRHAPHY  04/22/2020   Procedure: GASTRORRHAPHY;  Surgeon: Virl Cagey, MD;  Location: [facility];  Service: General;;   IR CATHETER TUBE CHANGE  05/09/2020   LAPAROTOMY N/A 04/22/2020   Procedure: EXPLORATORY LAPAROTOMY;  Surgeon: Virl Cagey,  MD;  Location: [facility];  Service: General;  Laterality: N/A;   TONSILLECTOMY      OB History     Gravida  2   Para  2   Term      Preterm      AB      Living  2      SAB      IAB      Ectopic      Multiple      Live Births               Home Medications    Prior to Admission medications   Medication Sig Start Date End Date Taking? Authorizing Provider  acetaminophen (TYLENOL) 500 MG tablet Take 1 tablet (500 mg total) by mouth every 6 (six) hours as needed for mild pain or fever. 09/27/20   Johnson, Clanford L, MD  amLODipine (NORVASC) 2.5 MG tablet Take 1 tablet (2.5 mg total) by mouth daily. For BP 12/09/20   Ladell Pier, MD  citalopram (CELEXA) 20 MG tablet Take 1 tablet (20 mg total) by mouth daily. 01/27/21   Elsie Stain, MD  dicyclomine (BENTYL) 10 MG capsule Take 10 mg by mouth 2 (two) times daily. 11/04/20   [provider]  donepezil (ARICEPT) 10 MG tablet Take 0.5-1 tablets (5-10 mg total) by mouth in the morning and at bedtime. 01/27/21   Elsie Stain, MD  gabapentin (NEURONTIN) 300 MG capsule Take 1 capsule (300 mg total) by mouth 2 (two) times daily. 01/27/21   Elsie Stain, MD  lactose free nutrition (BOOST) LIQD Take 237 mLs by mouth 2 (two) times daily.    [provider]  loperamide (IMODIUM) 2 MG capsule Take 1 capsule (2 mg total) by mouth 2 (two) times daily as needed for diarrhea or loose stools. 03/11/21   Jaynee Eagles, PA-C  ondansetron (ZOFRAN-ODT) 8 MG disintegrating tablet Take 1 tablet (8 mg total) by mouth every 8 (eight) hours as needed for nausea or vomiting. 03/11/21   Jaynee Eagles, PA-C  pantoprazole (PROTONIX) 40 MG tablet Take 1 tablet (40 mg total) by mouth 2 (two) times daily. 01/27/21 02/26/21  Elsie Stain, MD  sucralfate (CARAFATE) 1 GM/10ML suspension Take 10 mLs (1 g total) by mouth 4 (four) times daily -  with meals and at bedtime. 01/27/21   Elsie Stain, MD  tiZANidine (ZANAFLEX) 4 MG tablet  Take 1 tablet (4 mg total) by mouth every 8 (eight) hours as needed for muscle spasms. 04/07/21   Ladell Pier, MD  traMADol (ULTRAM) 50 MG tablet Take 1 tablet (50 mg total) by mouth every 8 (eight) hours as needed. 03/20/21   Argentina Donovan, PA-C  traZODone (DESYREL) 100 MG tablet Take 1 tablet (100 mg total) by mouth at bedtime. 01/27/21   Elsie Stain, MD    Family History Family History  Problem Relation Age of Onset   High Cholesterol Mother    Hypertension Mother    Arthritis/Rheumatoid Mother    Pancreatic cancer Mother 81   Alcohol abuse Father    Hypertension Maternal Grandmother    Uterine cancer Maternal  Grandmother 36   Ovarian cancer Cousin        before age 38   Colon cancer Neg Hx    Colon polyps Neg Hx    Inflammatory bowel disease Neg Hx     Social History Social History   Tobacco Use   Smoking status: Never   Smokeless tobacco: Never  Vaping Use   Vaping Use: Never used  Substance Use Topics   Alcohol use: Yes    Alcohol/week: 2.0 - 3.0 standard drinks    Types: 2 - 3 Glasses of wine per week   Drug use: No     Allergies   Wheat extract and Ketamine   Review of Systems Review of Systems Pertinent negatives listed in HPI   Physical Exam Triage Vital Signs ED Triage Vitals  Enc Vitals Group     BP 04/07/21 1949 (!) 176/96     Pulse Rate 04/07/21 1949 73     Resp 04/07/21 1949 16     Temp 04/07/21 1949 98 F (36.7 C)     Temp Source 04/07/21 1949 Tympanic     SpO2 04/07/21 1949 94 %     Weight --      Height --      Head Circumference --      Peak Flow --      Pain Score 04/07/21 1957 8     Pain Loc --      Pain Edu? --      Excl. in [place]? --    No data found.  Updated Vital Signs BP (!) 176/96 (BP Location: Right Arm)   Pulse 73   Temp 98 F (36.7 C) (Tympanic)   Resp 16   SpO2 94%   Visual Acuity Right Eye Distance:   Left Eye Distance:   Bilateral Distance:    Right Eye Near:   Left Eye Near:    Bilateral  Near:     Physical Exam General appearance: alert, well developed, well nourished, cooperative and in no distress Head: Normocephalic, without obvious abnormality, atraumatic Respiratory: Respirations even and unlabored, normal respiratory rate Heart: rate and rhythm normal. No gallop or murmurs noted on exam  Abdomen: BS +, no distention, no rebound tenderness, or no mass Extremities: No gross deformities Skin: Skin color, texture, turgor normal. No rashes seen  Psych: Appropriate mood and affect. Neurologic: GCS 15 , normal coordination , normal gait  UC Treatments / Results  Labs (all labs ordered are listed, but only abnormal results are displayed) Labs Reviewed - No data to display  EKG   Radiology No results found.  Procedures Procedures (including critical care time)  Medications Ordered in UC Medications - No data to display  Initial Impression / Assessment and Plan / UC Course  I have reviewed the triage vital signs and the nursing notes.  Pertinent labs & imaging results that were available during my care of the patient were reviewed by me and considered in my medical decision making (see chart for details).    Chronic abdominal pain, no peritoneal signes therefore minimal concern for acute abdominal.  GI rest, Dicyclomine for abdominal pain , continue PPI. Keep follow-up with PCP an GI Final Clinical Impressions(s) / UC Diagnoses   Final diagnoses:  Chronic abdominal pain     Discharge Instructions      Keep follow-up with PCP. Go to the ER if symptoms get worse.   ED Prescriptions   None    PDMP not reviewed this encounter.  Scot Jun, FNP 04/14/21 [phone]

## 2021-04-07 NOTE — Telephone Encounter (Signed)
patient is calling to request refill(s) on the following medications - tiZANidine And  Duloxetine   Pharmacy is [facility] [place]  Patient number is [phone]

## 2021-04-07 NOTE — ED Triage Notes (Signed)
Pain to RT Side of trunk for over a week.  Reports diarrhea and vomiting Monday and Tuesday.  States she some black diarrhea Thursday Friday and Saturday.  Reports taking pepto bismol.  Denies burning or urinary frequency.

## 2021-04-10 ENCOUNTER — Ambulatory Visit: Payer: Medicaid Other | Attending: Physician Assistant | Admitting: Physician Assistant

## 2021-04-10 ENCOUNTER — Encounter: Payer: Self-pay | Admitting: Physician Assistant

## 2021-04-10 ENCOUNTER — Other Ambulatory Visit: Payer: Self-pay

## 2021-04-10 VITALS — BP 128/83 | HR 83 | Ht 64.0 in | Wt 167.6 lb

## 2021-04-10 DIAGNOSIS — R109 Unspecified abdominal pain: Secondary | ICD-10-CM | POA: Diagnosis not present

## 2021-04-10 DIAGNOSIS — Z09 Encounter for follow-up examination after completed treatment for conditions other than malignant neoplasm: Secondary | ICD-10-CM

## 2021-04-10 DIAGNOSIS — I10 Essential (primary) hypertension: Secondary | ICD-10-CM | POA: Diagnosis not present

## 2021-04-10 DIAGNOSIS — F332 Major depressive disorder, recurrent severe without psychotic features: Secondary | ICD-10-CM | POA: Diagnosis not present

## 2021-04-10 DIAGNOSIS — D649 Anemia, unspecified: Secondary | ICD-10-CM | POA: Diagnosis not present

## 2021-04-10 DIAGNOSIS — G8929 Other chronic pain: Secondary | ICD-10-CM

## 2021-04-10 MED ORDER — QUETIAPINE FUMARATE 50 MG PO TABS: 50.0000 mg | ORAL_TABLET | Freq: Every day | ORAL | 1 refills | Status: DC

## 2021-04-10 MED ORDER — AMLODIPINE BESYLATE 2.5 MG PO TABS: 2.5000 mg | ORAL_TABLET | Freq: Every day | ORAL | 3 refills | Status: DC

## 2021-04-10 NOTE — Progress Notes (Signed)
Referral to GI for abdominal pain.

## 2021-04-10 NOTE — Progress Notes (Signed)
Patient ID: Maria Murphy, female   DOB: [birth year], 65 y.o.   MRN: [id number]   Cherice Arie, is a 65 y.o. female  [id number]  [id number]  DOB - April 06, 1956  Chief Complaint  Patient presents with   Abdominal Pain       Subjective:   Maria Murphy is a 65 y.o. female here today for a follow up visit after ED visits for abdominal pain.  The abdominal pain she has is chronic and ongoing.  Some nausea.  Occasional diarrhea.  Some diarrhea. No melena/hematochezia.  Anemic for about 1 year.  Had to have transfusion 01/17/2021.  She has upcoming appt for follow up.  Hgb 02/26/2021=10.6.    Needs BP meds RF and requests 90 days.  No HA/dizziness/sob.    Also needs seroquel RF-she is taking this for sleep.  Previous SI but has not had in a while.  Feels stable and hopeful.  Not in pain today.  She was seen by pain management but she "didn't like the lady" and wants to renew her contract here which she will discuss with PCP as she does not need a RF today.    No problems updated.  ALLERGIES: Allergies  Allergen Reactions   Wheat Extract Swelling   Ketamine Other (See Comments)    Agitation, confusion, hypertension, tachycardia    PAST MEDICAL HISTORY: Past Medical History:  Diagnosis Date   Acute saddle pulmonary embolism without acute cor pulmonale (HCC)    Basal cell carcinoma 01/16/2021   L nose   Collagen vascular disease (HCC)    Duodenal ulcer    Essential hypertension, benign 01/24/2015   Family history of ovarian cancer 01/06/2021   Family history of pancreatic cancer 01/06/2021   Family history of uterine cancer 01/06/2021   Foot ulcer (HCC)    GERD (gastroesophageal reflux disease)    Hypertension    Memory loss    Peptic ulcer with perforation (HCC)    Perforated abdominal viscus 04/22/2020   PUD (peptic ulcer disease)    with perforation s/p exploratory laparotomy   Pulmonary embolus (HCC) 04/2020   Skin cancer    Suicidal ideations 08/07/2020    Vision abnormalities     MEDICATIONS AT HOME: Prior to Admission medications   Medication Sig Start Date End Date Taking? Authorizing Provider  acetaminophen (TYLENOL) 500 MG tablet Take 1 tablet (500 mg total) by mouth every 6 (six) hours as needed for mild pain or fever. 09/27/20  Yes Johnson, Clanford L, MD  dicyclomine (BENTYL) 10 MG capsule Take 10 mg by mouth 2 (two) times daily. 11/04/20  Yes [provider]  donepezil (ARICEPT) 10 MG tablet Take 0.5-1 tablets (5-10 mg total) by mouth in the morning and at bedtime. 01/27/21  Yes Storm Frisk, MD  gabapentin (NEURONTIN) 300 MG capsule Take 1 capsule (300 mg total) by mouth 2 (two) times daily. 01/27/21  Yes Storm Frisk, MD  lactose free nutrition (BOOST) LIQD Take 237 mLs by mouth 2 (two) times daily.   Yes [provider]  loperamide (IMODIUM) 2 MG capsule Take 1 capsule (2 mg total) by mouth 2 (two) times daily as needed for diarrhea or loose stools. 03/11/21  Yes Wallis Bamberg, PA-C  ondansetron (ZOFRAN-ODT) 8 MG disintegrating tablet Take 1 tablet (8 mg total) by mouth every 8 (eight) hours as needed for nausea or vomiting. 03/11/21  Yes Wallis Bamberg, PA-C  sucralfate (CARAFATE) 1 GM/10ML suspension Take 10 mLs (1 g total) by mouth 4 (four) times  daily -  with meals and at bedtime. 01/27/21  Yes Storm Frisk, MD  tiZANidine (ZANAFLEX) 4 MG tablet Take 1 tablet (4 mg total) by mouth every 8 (eight) hours as needed for muscle spasms. 04/07/21  Yes Marcine Matar, MD  traMADol (ULTRAM) 50 MG tablet Take 1 tablet (50 mg total) by mouth every 8 (eight) hours as needed. 03/20/21  Yes Anders Simmonds, PA-C  traZODone (DESYREL) 100 MG tablet Take 1 tablet (100 mg total) by mouth at bedtime. 01/27/21  Yes Storm Frisk, MD  amLODipine (NORVASC) 2.5 MG tablet Take 1 tablet (2.5 mg total) by mouth daily. For BP 04/10/21   Georgian Co M, PA-C  pantoprazole (PROTONIX) 40 MG tablet Take 1 tablet (40 mg total) by mouth 2  (two) times daily. 01/27/21 02/26/21  Storm Frisk, MD  QUEtiapine (SEROQUEL) 50 MG tablet Take 1 tablet (50 mg total) by mouth at bedtime. 04/10/21   Matsuko Kretz, Marzella Schlein, PA-C    ROS: Neg HEENT Neg resp Neg cardiac Neg GI Neg GU Neg MS Neg psych Neg neuro  Objective:   Vitals:   04/10/21 0923  BP: 128/83  Pulse: 83  SpO2: 98%  Weight: 167 lb 9.6 oz (76 kg)  Height: 5\' 4"  (1.626 m)   Exam General appearance : Awake, alert, not in any distress. Speech Clear. Not toxic looking HEENT: Atraumatic and Normocephalic Neck: Supple, no JVD. No cervical lymphadenopathy.  Chest: Good air entry bilaterally, CTAB.  No rales/rhonchi/wheezing CVS: S1 S2 regular, no murmurs.  Abdomen: Bowel sounds present, Non tender and not distended with no gaurding, rigidity or rebound. Extremities: B/L Lower Ext shows no edema, both legs are warm to touch Neurology: Awake alert, and oriented X 3, CN II-XII intact, Non focal Skin: No Rash  Data Review No results found for: HGBA1C  Assessment & Plan   1. Anemia, unspecified type F/up with heme/onc as scheduled.   - Ambulatory referral to Gastroenterology  2. Essential hypertension controlled - amLODipine (NORVASC) 2.5 MG tablet; Take 1 tablet (2.5 mg total) by mouth daily. For BP  Dispense: 90 tablet; Refill: 3  3. Chronic abdominal pain Non-acute abdomen - Ambulatory referral to Gastroenterology  4. Severe episode of recurrent major depressive disorder, without psychotic features (HCC) Stable and followed by [facility] - QUEtiapine (SEROQUEL) 50 MG tablet; Take 1 tablet (50 mg total) by mouth at bedtime.  Dispense: 90 tablet; Refill: 1  5. Hospital discharge follow-up    Patient have been counseled extensively about nutrition and exercise. Other issues discussed during this visit include: low cholesterol diet, weight control and daily exercise, foot care, annual eye examinations at Ophthalmology, importance of adherence with medications and  regular follow-up. We also discussed long term complications of uncontrolled diabetes and hypertension.   Return for has appt with Dr Delford Field 9/20.  The patient was given clear instructions to go to ER or return to medical center if symptoms don't improve, worsen or new problems develop. The patient verbalized understanding. The patient was told to call to get lab results if they haven't heard anything in the next week.      Georgian Co, PA-C [facility] [place], [place] [phone]   04/10/2021, 9:56 AM

## 2021-04-14 ENCOUNTER — Encounter: Payer: Self-pay | Admitting: Internal Medicine

## 2021-04-14 DIAGNOSIS — Z85828 Personal history of other malignant neoplasm of skin: Secondary | ICD-10-CM | POA: Insufficient documentation

## 2021-04-17 ENCOUNTER — Telehealth: Payer: Self-pay | Admitting: Internal Medicine

## 2021-04-17 NOTE — Telephone Encounter (Signed)
Copied from [place] [id number]. Topic: General - Inquiry >> Apr 15, 2021  3:51 PM Greggory Keen D wrote: Reason for CRM: Pt is calling asking about a referral to a gastroenterologist,  She said she has talked to Dr. Wynetta Emery about this.  She said she just needs a referral to a new doctor.   CB#  [place], I saw and referral from 8/18 for this patient. Would she need another one or need to wait for them to call her.

## 2021-04-21 NOTE — Telephone Encounter (Signed)
I Spoke to patient and new referral will be sent to [facility]

## 2021-04-23 ENCOUNTER — Encounter: Payer: Self-pay | Admitting: Internal Medicine

## 2021-04-23 NOTE — Progress Notes (Signed)
Note received from Dr. Onalee Hua at [facility] dermatology.  Patient seen 04/14/2021.  She had surgery to the left side of her nose to remove a basal cell lesion.

## 2021-04-24 ENCOUNTER — Ambulatory Visit (INDEPENDENT_AMBULATORY_CARE_PROVIDER_SITE_OTHER): Payer: Medicaid Other | Admitting: Dermatology

## 2021-04-24 ENCOUNTER — Other Ambulatory Visit: Payer: Self-pay

## 2021-04-24 ENCOUNTER — Encounter: Payer: Self-pay | Admitting: Dermatology

## 2021-04-24 DIAGNOSIS — L820 Inflamed seborrheic keratosis: Secondary | ICD-10-CM | POA: Diagnosis not present

## 2021-04-24 DIAGNOSIS — L578 Other skin changes due to chronic exposure to nonionizing radiation: Secondary | ICD-10-CM

## 2021-04-24 DIAGNOSIS — Z85828 Personal history of other malignant neoplasm of skin: Secondary | ICD-10-CM

## 2021-04-24 NOTE — Patient Instructions (Addendum)

## 2021-04-24 NOTE — Progress Notes (Signed)
   Follow-Up Visit   Subjective  Maria Murphy is a 65 y.o. female who presents for the following: Hx of BCC (L nose, 76mf/u, Mohs 10 days ago) and check spot (L lower eyelid, 793mitchy). Check face.  The following portions of the chart were reviewed this encounter and updated as appropriate:   Tobacco  Allergies  Meds  Problems  Med Hx  Surg Hx  Fam Hx     Review of Systems:  No other skin or systemic complaints except as noted in HPI or Assessment and Plan.  Objective  Well appearing patient in no apparent distress; mood and affect are within normal limits.  A focused examination was performed including face. Relevant physical exam findings are noted in the Assessment and Plan.  L nose Healing excision site  L lower eyelid margin x 1, Total = 1 Erythematous keratotic or waxy stuck-on papule or plaque.    Assessment & Plan  History of basal cell carcinoma (BCC) L nose S/p MOHs with Dr. CoLacinda Axonn 04/14/21 Healing excision site, observe  Inflamed seborrheic keratosis L lower eyelid margin x 1, Total = 1  Destruction of lesion - L lower eyelid margin x 1, Total = 1 Complexity: simple   Destruction method: cryotherapy   Informed consent: discussed and consent obtained   Timeout:  patient name, date of birth, surgical site, and procedure verified Lesion destroyed using liquid nitrogen: Yes   Region frozen until ice ball extended beyond lesion: Yes   Outcome: patient tolerated procedure well with no complications   Post-procedure details: wound care instructions given    Actinic Damage - chronic, secondary to cumulative UV radiation exposure/sun exposure over time - diffuse scaly erythematous macules with underlying dyspigmentation - Recommend daily broad spectrum sunscreen SPF 30+ to sun-exposed areas, reapply every 2 hours as needed.  - Recommend staying in the shade or wearing long sleeves, sun glasses (UVA+UVB protection) and wide brim hats (4-inch brim around the  entire circumference of the hat). - Call for new or changing lesions.  Return in about 4 months (around 08/24/2021) for Recheck ISK L lower eyelid margin, Hx of BCC L nose.  I, SoOthelia PullingRMA, am acting as scribe for DaSarina SerMD . Documentation: I have reviewed the above documentation for accuracy and completeness, and I agree with the above.  DaSarina SerMD

## 2021-04-29 ENCOUNTER — Telehealth: Payer: Self-pay | Admitting: Gastroenterology

## 2021-04-29 ENCOUNTER — Telehealth: Payer: Self-pay | Admitting: Internal Medicine

## 2021-04-29 NOTE — Telephone Encounter (Signed)
Hey Dr. Rush Landmark,   We have a transfer of care for anemia and abd pain. Patient was previously seen at [facility]. Records are in Epic. Could you please review and advise on scheduling?  Thank you

## 2021-04-29 NOTE — Telephone Encounter (Signed)
Patient called and stated that she was going to go to [place] and to cancel her upcoming appointment

## 2021-04-30 ENCOUNTER — Other Ambulatory Visit: Payer: Self-pay

## 2021-04-30 DIAGNOSIS — K862 Cyst of pancreas: Secondary | ICD-10-CM

## 2021-04-30 DIAGNOSIS — K269 Duodenal ulcer, unspecified as acute or chronic, without hemorrhage or perforation: Secondary | ICD-10-CM

## 2021-04-30 NOTE — Telephone Encounter (Signed)
Upper GI series needed to be scheduled order entered .  Will schedule EGD EUS after upper GI is complete.

## 2021-04-30 NOTE — Telephone Encounter (Signed)
I spoke with the pt and she agrees to upper GI.  Order has been entered and sent to the schedulers.

## 2021-04-30 NOTE — Telephone Encounter (Signed)
She has already established with the [facility] and should maintain that care coordination. I am not sure that I have availability to take her on as a new true patient rather than as a continued Optometrist.   With that being said, she does need repeat EGD/EUS attempt. Patty, if the patient wants [place] to move forward with trying to further evaluate her previous ulcer and pancreatic cyst, then please move forward with getting her scheduled for an upper GI series to evaluate the previous duodenal stricture/ulcer.  The patient may be scheduled for her the procedure EGD/EUS linear to reevaluate the pancreatic cyst after her upper GI series is completed. Hopefully her duodenum has healed if it has not then she may need surgical intervention. I will remain a consultant for the [facility] group and for the patient but I will not be her primary gastroenterologist. Thanks. GM

## 2021-05-01 ENCOUNTER — Encounter: Payer: Self-pay | Admitting: Dermatology

## 2021-05-06 ENCOUNTER — Ambulatory Visit: Payer: Self-pay | Admitting: *Deleted

## 2021-05-06 NOTE — Telephone Encounter (Signed)
C/o dizziness since yesterday and room spinning when walking . B/P 160/97 15 minutes ago prior to call. C/o nausea, worsening room spinning when standing of trying to walk. Blurred vision. Denies weakness on either side of body. Reports vanity light fell on her right side of head 05/01/21 when she was trying to change light bulb. Reports "2x2 swelling" to right side of head. Speech clear and oriented x 3. Instructed patient to go to ED now and patient reports she does not have anyone to take her. Instructed patient to call 911 and go to ED for evaluation for symptoms now. Patient verbalized understanding and reports she will call 911.

## 2021-05-06 NOTE — Telephone Encounter (Signed)
Reason for Disposition  SEVERE dizziness (vertigo) (e.g., unable to walk without assistance)  Answer Assessment - Initial Assessment Questions 1. DESCRIPTION: "Describe your dizziness."     Room spinning when standing up 2. VERTIGO: "Do you feel like either you or the room is spinning or tilting?"      Room spinning  3. LIGHTHEADED: "Do you feel lightheaded?" (e.g., somewhat faint, woozy, weak upon standing)     Na  4. SEVERITY: "How bad is it?"  "Can you walk?"   - MILD: Feels slightly dizzy and unsteady, but is walking normally.   - MODERATE: Feels unsteady when walking, but not falling; interferes with normal activities (e.g., school, work).   - SEVERE: Unable to walk without falling, or requires assistance to walk without falling.     Room spinning when standing up to walk 5. ONSET:  "When did the dizziness begin?"     Yesterday  6. AGGRAVATING FACTORS: "Does anything make it worse?" (e.g., standing, change in head position)     Standing  7. CAUSE: "What do you think is causing the dizziness?"     Vanity light fell on right side of head 05/01/21 8. RECURRENT SYMPTOM: "Have you had dizziness before?" If Yes, ask: "When was the last time?" "What happened that time?"     No  9. OTHER SYMPTOMS: "Do you have any other symptoms?" (e.g., headache, weakness, numbness, vomiting, earache)     Nausea, blurred vision, room spinning when standing  elevated B/P 160/97 10. PREGNANCY: "Is there any chance you are pregnant?" "When was your last menstrual period?"       na  Protocols used: Dizziness - Vertigo-A-AH

## 2021-05-06 NOTE — Telephone Encounter (Signed)
Will forward to provider  

## 2021-05-12 ENCOUNTER — Other Ambulatory Visit: Payer: Self-pay

## 2021-05-12 ENCOUNTER — Ambulatory Visit (HOSPITAL_COMMUNITY)
Admission: RE | Admit: 2021-05-12 | Discharge: 2021-05-12 | Disposition: A | Discharge status: Home / Self Care | Payer: Medicaid Other | Source: Ambulatory Visit | Attending: Gastroenterology | Admitting: Gastroenterology

## 2021-05-12 DIAGNOSIS — K269 Duodenal ulcer, unspecified as acute or chronic, without hemorrhage or perforation: Secondary | ICD-10-CM | POA: Diagnosis not present

## 2021-05-12 DIAGNOSIS — K862 Cyst of pancreas: Secondary | ICD-10-CM | POA: Insufficient documentation

## 2021-05-13 ENCOUNTER — Other Ambulatory Visit: Payer: Self-pay

## 2021-05-13 ENCOUNTER — Emergency Department (HOSPITAL_COMMUNITY)
Admission: EM | Admit: 2021-05-13 | Discharge: 2021-05-14 | Disposition: A | Discharge status: Home / Self Care | Payer: Medicaid Other | Source: Home / Self Care | Attending: Emergency Medicine | Admitting: Emergency Medicine

## 2021-05-13 ENCOUNTER — Ambulatory Visit: Payer: Medicaid Other | Admitting: Critical Care Medicine

## 2021-05-13 ENCOUNTER — Emergency Department (HOSPITAL_COMMUNITY)
Admission: EM | Admit: 2021-05-13 | Discharge: 2021-05-13 | Discharge status: Against Medical Advice | Payer: Medicaid Other | Attending: Emergency Medicine | Admitting: Emergency Medicine

## 2021-05-13 ENCOUNTER — Encounter (HOSPITAL_COMMUNITY): Payer: Self-pay | Admitting: Emergency Medicine

## 2021-05-13 DIAGNOSIS — R109 Unspecified abdominal pain: Secondary | ICD-10-CM | POA: Insufficient documentation

## 2021-05-13 DIAGNOSIS — R112 Nausea with vomiting, unspecified: Secondary | ICD-10-CM | POA: Insufficient documentation

## 2021-05-13 DIAGNOSIS — I10 Essential (primary) hypertension: Secondary | ICD-10-CM | POA: Insufficient documentation

## 2021-05-13 DIAGNOSIS — Z5321 Procedure and treatment not carried out due to patient leaving prior to being seen by health care provider: Secondary | ICD-10-CM | POA: Insufficient documentation

## 2021-05-13 DIAGNOSIS — Z85828 Personal history of other malignant neoplasm of skin: Secondary | ICD-10-CM | POA: Insufficient documentation

## 2021-05-13 DIAGNOSIS — R1110 Vomiting, unspecified: Secondary | ICD-10-CM | POA: Insufficient documentation

## 2021-05-13 LAB — CBC
HCT: 40.1 % (ref 36.0–46.0)
Hemoglobin: 13.3 g/dL (ref 12.0–15.0)
MCH: 28.2 pg (ref 26.0–34.0)
MCHC: 33.2 g/dL (ref 30.0–36.0)
MCV: 85 fL (ref 80.0–100.0)
Platelets: 277 10*3/uL (ref 150–400)
RBC: 4.72 MIL/uL (ref 3.87–5.11)
RDW: 14.8 % (ref 11.5–15.5)
WBC: 11.4 10*3/uL — ABNORMAL HIGH (ref 4.0–10.5)
nRBC: 0 % (ref 0.0–0.2)

## 2021-05-13 LAB — COMPREHENSIVE METABOLIC PANEL
ALT: 8 U/L (ref 0–44)
AST: 18 U/L (ref 15–41)
Albumin: 4.2 g/dL (ref 3.5–5.0)
Alkaline Phosphatase: 58 U/L (ref 38–126)
Anion gap: 14 (ref 5–15)
BUN: 11 mg/dL (ref 8–23)
CO2: 24 mmol/L (ref 22–32)
Calcium: 9.6 mg/dL (ref 8.9–10.3)
Chloride: 98 mmol/L (ref 98–111)
Creatinine, Ser: 0.84 mg/dL (ref 0.44–1.00)
GFR, Estimated: >60 mL/min (ref >60)
Glucose, Bld: 121 mg/dL — ABNORMAL HIGH (ref 70–99)
Potassium: 3.2 mmol/L — ABNORMAL LOW (ref 3.5–5.1)
Sodium: 136 mmol/L (ref 135–145)
Total Bilirubin: 0.8 mg/dL (ref 0.3–1.2)
Total Protein: 7.9 g/dL (ref 6.5–8.1)

## 2021-05-13 LAB — LIPASE, BLOOD: Lipase: 28 U/L (ref 11–51)

## 2021-05-13 MED ORDER — ONDANSETRON HCL 4 MG/2ML IJ SOLN
4.0000 mg | Freq: Once | INTRAMUSCULAR | Status: AC
  Administered 2021-05-13: 4 mg via INTRAVENOUS
  Filled 2021-05-13: qty 2

## 2021-05-13 MED ORDER — METOCLOPRAMIDE HCL 5 MG/ML IJ SOLN
10.0000 mg | Freq: Once | INTRAMUSCULAR | Status: AC
  Administered 2021-05-13: 10 mg via INTRAVENOUS
  Filled 2021-05-13: qty 2

## 2021-05-13 MED ORDER — SODIUM CHLORIDE 0.9 % IV BOLUS
500.0000 mL | Freq: Once | INTRAVENOUS | Status: AC
  Administered 2021-05-13: 500 mL via INTRAVENOUS

## 2021-05-13 NOTE — ED Provider Notes (Signed)
Emergency Medicine Provider Triage Evaluation Note  Maria Murphy , a 65 y.o. female  was evaluated in triage.  Pt complains of abdominal pain since Saturday. Pain located in RLQ that feels like dull ache. Pain made worse with movement and positioning. No alleviating factors. No trauma or falls. Tried taking pain medicine this morning but threw it up.   Review of Systems  Positive: Abdominal pain, nausea, vomiting  Negative: Diarrhea, constipation, dysuria, urinary frequency  Physical Exam  BP (!) 176/103 (BP Location: Left Arm)   Pulse 93   Temp 98.9 F (37.2 C) (Oral)   Resp 16   Ht 5\' 4"  (1.626 m)   Wt 72.6 kg   SpO2 100%   BMI 27.46 kg/m  Gen:   Awake, no distress   Resp:  Normal effort  MSK:   Moves extremities without difficulty  Other:    Medical Decision Making  Medically screening exam initiated at 3:21 PM.  Appropriate orders placed.  Shaneca Orne was informed that the remainder of the evaluation will be completed by another provider, this initial triage assessment does not replace that evaluation, and the importance of remaining in the ED until their evaluation is complete.     Estill Cotta 05/13/21 1523    Wyvonnia Dusky, MD 05/13/21 1710

## 2021-05-13 NOTE — ED Triage Notes (Signed)
Pt arrived c/o right sided abdominal pain that started on Saturday  Pt large amounts of vomit this morning  Movement and positioning make pain worse   Denies trauma to region, no falls in past 3 months

## 2021-05-13 NOTE — ED Notes (Signed)
No response for vitals  

## 2021-05-13 NOTE — ED Triage Notes (Signed)
Pt c/o right sided abd pain since Saturday, hx of same. Pt also c/o vomiting all day.   Pt seen at [facility] earlier today for same.

## 2021-05-14 ENCOUNTER — Other Ambulatory Visit: Payer: Self-pay

## 2021-05-14 DIAGNOSIS — R1310 Dysphagia, unspecified: Secondary | ICD-10-CM

## 2021-05-14 MED ORDER — DIPHENHYDRAMINE HCL 50 MG/ML IJ SOLN
25.0000 mg | Freq: Once | INTRAMUSCULAR | Status: AC
  Administered 2021-05-14: 25 mg via INTRAVENOUS
  Filled 2021-05-14: qty 1

## 2021-05-14 NOTE — ED Notes (Signed)
Pt was tearful stating her abdominal pain worsen after less than 10 minutes after IV zofran and Reglan. Dr Kandra Nicolas informed. Pt and family at bedside updated to allow medications given to take effect. Pt then began to yell out in pain stating her pain is unbearable. IV Benadryl administered with improvement.

## 2021-05-14 NOTE — ED Provider Notes (Signed)
[facility] EMERGENCY DEPARTMENT Provider Note   CSN: [id number] Arrival date & time: 05/13/21  2233     History Chief Complaint  Patient presents with   Abdominal Pain   Emesis    Maria Murphy is a 65 y.o. female.  Presents to the emergency department for evaluation of abdominal pain with nausea and vomiting.  Patient reports that she has a stricture between "stomach and intestine".  Patient has a history of recurrent nausea and vomiting similar to this.  Patient denies any associated fever, diarrhea.      Past Medical History:  Diagnosis Date   Acute saddle pulmonary embolism without acute cor pulmonale (HCC)    Basal cell carcinoma 01/16/2021   L nose, MOHs 04/14/21   Collagen vascular disease ([place])    Duodenal ulcer    Essential hypertension, benign 01/24/2015   Family history of ovarian cancer 01/06/2021   Family history of pancreatic cancer 01/06/2021   Family history of uterine cancer 01/06/2021   Foot ulcer ([place])    GERD (gastroesophageal reflux disease)    Hypertension    Memory loss    Peptic ulcer with perforation ([place])    Perforated abdominal viscus 04/22/2020   PUD (peptic ulcer disease)    with perforation s/p exploratory laparotomy   Pulmonary embolus ([place]) 04/2020   Skin cancer    Suicidal ideations 08/07/2020   Vision abnormalities     Patient Active Problem List   Diagnosis Date Noted   Genetic testing 01/14/2021   Family history of pancreatic cancer 01/06/2021   Family history of ovarian cancer 01/06/2021   Family history of uterine cancer 01/06/2021   IDA (iron deficiency anemia) 12/25/2020   Opioid contract exists 12/09/2020   Pancreatic lesion    Chronic abdominal pain    History of DVT (deep vein thrombosis) 11/15/2020   Uncontrolled pain 11/15/2020   Anemia    History of pulmonary embolus (PE) 10/18/2020   Obesity (BMI 30-39.9) 10/18/2020   GERD (gastroesophageal reflux disease) 10/18/2020   Pancreatic mass 10/09/2020    Acquired complex cyst of kidney 10/09/2020   Basal cell carcinoma (BCC) of left side of nose 10/09/2020   Aortic atherosclerosis ([place]) 10/09/2020   Peripheral polyneuropathy 10/09/2020   Dehydration    Chronic diarrhea 09/24/2020   Diarrhea    Cholelithiasis 09/05/2020   Primary osteoarthritis of both knees 07/24/2020   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   Essential hypertension 01/24/2015   Memory loss 01/24/2015   Neck pain 01/24/2015   Depression 01/24/2015    Past Surgical History:  Procedure Laterality Date   BIOPSY  11/20/2020   Procedure: BIOPSY;  Surgeon: Rush Landmark Telford Nab., MD;  Location: [facility];  Service: Gastroenterology;;   CENTRAL LINE INSERTION Right 04/22/2020   Procedure: CENTRAL LINE INSERTION;  Surgeon: Virl Cagey, MD;  Location: [facility];  Service: General;  Laterality: Right;   DIAGNOSTIC LAPAROSCOPY     ESOPHAGOGASTRODUODENOSCOPY N/A 11/20/2020   Procedure: ESOPHAGOGASTRODUODENOSCOPY (EGD);  Surgeon: Irving Copas., MD;  Location: [facility];  Service: Gastroenterology;  Laterality: N/A;   ESOPHAGOGASTRODUODENOSCOPY (EGD) WITH PROPOFOL N/A 07/10/2020   Procedure: ESOPHAGOGASTRODUODENOSCOPY (EGD) WITH PROPOFOL;  Surgeon: Rogene Houston, MD;  [facility];  Service: Endoscopy;  Laterality: N/A;   ESOPHAGOGASTRODUODENOSCOPY (EGD) WITH PROPOFOL N/A 10/18/2020   Procedure: ESOPHAGOGASTRODUODENOSCOPY (EGD) WITH PROPOFOL;  Surgeon: Harvel Quale, MD;  [facility];  Service: Gastroenterology;  Laterality: N/A;   EUS N/A 11/20/2020   Procedure: UPPER ENDOSCOPIC ULTRASOUND (  EUS) LINEAR;  Surgeon: Rush Landmark Telford Nab., MD;  Location: [facility];  Service: Gastroenterology;  Laterality: N/A;   GASTRORRHAPHY  04/22/2020   Procedure: GASTRORRHAPHY;  Surgeon: Virl Cagey, MD;  Location: [facility];  Service: General;;   IR CATHETER TUBE CHANGE  05/09/2020   LAPAROTOMY N/A 04/22/2020   Procedure:  EXPLORATORY LAPAROTOMY;  Surgeon: Virl Cagey, MD;  Location: [facility];  Service: General;  Laterality: N/A;   TONSILLECTOMY       OB History     Gravida  2   Para  2   Term      Preterm      AB      Living  2      SAB      IAB      Ectopic      Multiple      Live Births              Family History  Problem Relation Age of Onset   High Cholesterol Mother    Hypertension Mother    Arthritis/Rheumatoid Mother    Pancreatic cancer Mother 46   Alcohol abuse Father    Hypertension Maternal Grandmother    Uterine cancer Maternal Grandmother 46   Ovarian cancer Cousin        before age 45   Colon cancer Neg Hx    Colon polyps Neg Hx    Inflammatory bowel disease Neg Hx     Social History   Tobacco Use   Smoking status: Never   Smokeless tobacco: Never  Vaping Use   Vaping Use: Never used  Substance Use Topics   Alcohol use: Yes    Alcohol/week: 2.0 - 3.0 standard drinks    Types: 2 - 3 Glasses of wine per week   Drug use: No    Home Medications Prior to Admission medications   Medication Sig Start Date End Date Taking? Authorizing Provider  acetaminophen (TYLENOL) 500 MG tablet Take 1 tablet (500 mg total) by mouth every 6 (six) hours as needed for mild pain or fever. 09/27/20   Johnson, Clanford L, MD  amLODipine (NORVASC) 2.5 MG tablet Take 1 tablet (2.5 mg total) by mouth daily. For BP 04/10/21   Freeman Caldron M, PA-C  dicyclomine (BENTYL) 10 MG capsule Take 10 mg by mouth 2 (two) times daily. 11/04/20   [provider]  donepezil (ARICEPT) 10 MG tablet Take 0.5-1 tablets (5-10 mg total) by mouth in the morning and at bedtime. 01/27/21   Elsie Stain, MD  gabapentin (NEURONTIN) 300 MG capsule Take 1 capsule (300 mg total) by mouth 2 (two) times daily. 01/27/21   Elsie Stain, MD  lactose free nutrition (BOOST) LIQD Take 237 mLs by mouth 2 (two) times daily.    [provider]  loperamide (IMODIUM) 2 MG capsule Take 1  capsule (2 mg total) by mouth 2 (two) times daily as needed for diarrhea or loose stools. 03/11/21   Jaynee Eagles, PA-C  ondansetron (ZOFRAN-ODT) 8 MG disintegrating tablet Take 1 tablet (8 mg total) by mouth every 8 (eight) hours as needed for nausea or vomiting. 03/11/21   Jaynee Eagles, PA-C  pantoprazole (PROTONIX) 40 MG tablet Take 1 tablet (40 mg total) by mouth 2 (two) times daily. 01/27/21 02/26/21  Elsie Stain, MD  QUEtiapine (SEROQUEL) 50 MG tablet Take 1 tablet (50 mg total) by mouth at bedtime. 04/10/21   Argentina Donovan, PA-C  sucralfate (CARAFATE) 1 GM/10ML  suspension Take 10 mLs (1 g total) by mouth 4 (four) times daily -  with meals and at bedtime. 01/27/21   Elsie Stain, MD  tiZANidine (ZANAFLEX) 4 MG tablet Take 1 tablet (4 mg total) by mouth every 8 (eight) hours as needed for muscle spasms. 04/07/21   Ladell Pier, MD  traMADol (ULTRAM) 50 MG tablet Take 1 tablet (50 mg total) by mouth every 8 (eight) hours as needed. 03/20/21   Argentina Donovan, PA-C  traZODone (DESYREL) 100 MG tablet Take 1 tablet (100 mg total) by mouth at bedtime. 01/27/21   Elsie Stain, MD    Allergies    Wheat extract and Ketamine  Review of Systems   Review of Systems  Gastrointestinal:  Positive for abdominal pain, nausea and vomiting.  All other systems reviewed and are negative.  Physical Exam Updated Vital Signs BP (!) 157/95   Pulse 81   Temp 98.8 F (37.1 C) (Oral)   Resp 16   Ht 5\' 4"  (1.626 m)   Wt 72.6 kg   SpO2 95%   BMI 27.46 kg/m   Physical Exam Vitals and nursing note reviewed.  Constitutional:      General: She is not in acute distress.    Appearance: Normal appearance. She is well-developed.  HENT:     Head: Normocephalic and atraumatic.     Right Ear: Hearing normal.     Left Ear: Hearing normal.     Nose: Nose normal.  Eyes:     Conjunctiva/sclera: Conjunctivae normal.     Pupils: Pupils are equal, round, and reactive to light.  Cardiovascular:      Rate and Rhythm: Regular rhythm.     Heart sounds: S1 normal and S2 normal. No murmur heard.   No friction rub. No gallop.  Pulmonary:     Effort: Pulmonary effort is normal. No respiratory distress.     Breath sounds: Normal breath sounds.  Chest:     Chest wall: No tenderness.  Abdominal:     General: Bowel sounds are normal.     Palpations: Abdomen is soft.     Tenderness: There is no abdominal tenderness. There is no guarding or rebound. Negative signs include Murphy's sign and McBurney's sign.     Hernia: No hernia is present.  Musculoskeletal:        General: Normal range of motion.     Cervical back: Normal range of motion and neck supple.  Skin:    General: Skin is warm and dry.     Findings: No rash.  Neurological:     Mental Status: She is alert and oriented to person, place, and time.     GCS: GCS eye subscore is 4. GCS verbal subscore is 5. GCS motor subscore is 6.     Cranial Nerves: No cranial nerve deficit.     Sensory: No sensory deficit.     Coordination: Coordination normal.  Psychiatric:        Speech: Speech normal.        Behavior: Behavior normal.        Thought Content: Thought content normal.    ED Results / Procedures / Treatments   Labs (all labs ordered are listed, but only abnormal results are displayed) Labs Reviewed  URINALYSIS, ROUTINE W REFLEX MICROSCOPIC    EKG None  Radiology DG UGI W DOUBLE CM (HD BA)  Result Date: 05/12/2021 CLINICAL DATA:  Patient with history of duodenal ulcer and pancreatic cysts. Dysphagia  and abdominal pain. Difficulty keeping solid foods down. Vomiting. EXAM: UPPER GI SERIES WITH KUB TECHNIQUE: After obtaining a scout radiograph a routine upper GI series was performed using thin and thick barium FLUOROSCOPY TIME:  Fluoroscopy Time:  3 minutes 54 seconds Radiation Exposure Index (if provided by the fluoroscopic device): 135.5 mGy Number of Acquired Spot Images: 18 COMPARISON:  CT 01/25/2021, MRI 12/23/2020 FINDINGS:  Contrast flowed freely through the esophagus into the stomach. The stomach has elongated morphology with the greater curvature at the level of the pelvic brim in upright position. Initial imaging of the stomach demonstrates retained food stuff within the stomach. This makes evaluation of the gastric mucosa difficult. Contrast slowly flowed into the duodenal bulb, second portion duodenum and third portion duodenum. There is a persistent stricturing at the junction of the duodenal bulb and second portion the duodenum. The duodenal bulb hasa cloverleaf pattern. This stricturing at the junction of the first and second portion the duodenum occurs over proximally 1.5 cm and only a thin stream of barium passes. IMPRESSION: 1. Focus of persistent stricturing in the proximal duodenum at the junction of the duodenal bulb and second portion the duodenum. Stricturing occurs over approximately 1.5 cm segment and results in severe restriction of the lumen. 2. Cloverleaf pattern of the duodenal bulb suggests healed ulcer. 3. Distal second and third portion duodenum appear normal. 4. Retained food stuff within the stomach. Electronically Signed   By: Suzy Bouchard M.D.   On: 05/12/2021 11:05    Procedures Procedures   Medications Ordered in ED Medications  sodium chloride 0.9 % bolus 500 mL (500 mLs Intravenous New Bag/Given 05/13/21 2348)  ondansetron (ZOFRAN) injection 4 mg (4 mg Intravenous Given 05/13/21 2348)  metoCLOPramide (REGLAN) injection 10 mg (10 mg Intravenous Given 05/13/21 2349)  diphenhydrAMINE (BENADRYL) injection 25 mg (25 mg Intravenous Given 05/14/21 0014)    ED Course  I have reviewed the triage vital signs and the nursing notes.  Pertinent labs & imaging results that were available during my care of the patient were reviewed by me and considered in my medical decision making (see chart for details).    MDM Rules/Calculators/A&P                           Patient presents with acute nausea,  vomiting with abdominal pain.  Patient reports recurrent similar symptoms in the past.  Patient indicates right upper abdominal pain.  Exam, however, is benign, nontender.  No evidence of tenderness over the gallbladder, negative Murphy sign.  Patient's lab work is reassuring.  She had lab work performed earlier at [facility] prior to leaving because of long wait times.  She does not require imaging at this time.  She is feeling much better after symptomatic treatment.  Will discharge, follow-up with primary care.  Final Clinical Impression(s) / ED Diagnoses Final diagnoses:  Non-intractable vomiting with nausea, unspecified vomiting type    Rx / DC Orders ED Discharge Orders     None        Orpah Greek, MD 05/14/21 [phone]

## 2021-05-16 ENCOUNTER — Other Ambulatory Visit: Payer: Self-pay

## 2021-05-16 ENCOUNTER — Inpatient Hospital Stay (HOSPITAL_COMMUNITY): Payer: Medicaid Other | Attending: Hematology

## 2021-05-16 DIAGNOSIS — Z79899 Other long term (current) drug therapy: Secondary | ICD-10-CM | POA: Insufficient documentation

## 2021-05-16 DIAGNOSIS — Z7901 Long term (current) use of anticoagulants: Secondary | ICD-10-CM | POA: Diagnosis not present

## 2021-05-16 DIAGNOSIS — R5383 Other fatigue: Secondary | ICD-10-CM | POA: Diagnosis not present

## 2021-05-16 DIAGNOSIS — R002 Palpitations: Secondary | ICD-10-CM | POA: Diagnosis not present

## 2021-05-16 DIAGNOSIS — I10 Essential (primary) hypertension: Secondary | ICD-10-CM | POA: Diagnosis not present

## 2021-05-16 DIAGNOSIS — Z8711 Personal history of peptic ulcer disease: Secondary | ICD-10-CM | POA: Insufficient documentation

## 2021-05-16 DIAGNOSIS — K219 Gastro-esophageal reflux disease without esophagitis: Secondary | ICD-10-CM | POA: Insufficient documentation

## 2021-05-16 DIAGNOSIS — I2699 Other pulmonary embolism without acute cor pulmonale: Secondary | ICD-10-CM | POA: Insufficient documentation

## 2021-05-16 DIAGNOSIS — Z85828 Personal history of other malignant neoplasm of skin: Secondary | ICD-10-CM | POA: Insufficient documentation

## 2021-05-16 DIAGNOSIS — Z800 Family history of malignant neoplasm of digestive organs: Secondary | ICD-10-CM | POA: Insufficient documentation

## 2021-05-16 DIAGNOSIS — K8689 Other specified diseases of pancreas: Secondary | ICD-10-CM | POA: Insufficient documentation

## 2021-05-16 DIAGNOSIS — D509 Iron deficiency anemia, unspecified: Secondary | ICD-10-CM | POA: Insufficient documentation

## 2021-05-16 DIAGNOSIS — D500 Iron deficiency anemia secondary to blood loss (chronic): Secondary | ICD-10-CM

## 2021-05-16 LAB — CBC WITH DIFFERENTIAL/PLATELET
Abs Immature Granulocytes: 0.01 10*3/uL (ref 0.00–0.07)
Basophils Absolute: 0 10*3/uL (ref 0.0–0.1)
Basophils Relative: 0 %
Eosinophils Absolute: 0.1 10*3/uL (ref 0.0–0.5)
Eosinophils Relative: 3 %
HCT: 36.8 % (ref 36.0–46.0)
Hemoglobin: 12 g/dL (ref 12.0–15.0)
Immature Granulocytes: 0 %
Lymphocytes Relative: 27 %
Lymphs Abs: 1.2 10*3/uL (ref 0.7–4.0)
MCH: 28.8 pg (ref 26.0–34.0)
MCHC: 32.6 g/dL (ref 30.0–36.0)
MCV: 88.5 fL (ref 80.0–100.0)
Monocytes Absolute: 0.4 10*3/uL (ref 0.1–1.0)
Monocytes Relative: 8 %
Neutro Abs: 2.8 10*3/uL (ref 1.7–7.7)
Neutrophils Relative %: 62 %
Platelets: 220 10*3/uL (ref 150–400)
RBC: 4.16 MIL/uL (ref 3.87–5.11)
RDW: 15.4 % (ref 11.5–15.5)
WBC: 4.6 10*3/uL (ref 4.0–10.5)
nRBC: 0 % (ref 0.0–0.2)

## 2021-05-16 LAB — IRON AND TIBC
Iron: 64 ug/dL (ref 28–170)
Saturation Ratios: 18 % (ref 10.4–31.8)
TIBC: 353 ug/dL (ref 250–450)
UIBC: 289 ug/dL

## 2021-05-16 LAB — FERRITIN: Ferritin: 194 ng/mL (ref 11–307)

## 2021-05-20 ENCOUNTER — Other Ambulatory Visit: Payer: Self-pay | Admitting: Nurse Practitioner

## 2021-05-20 ENCOUNTER — Telehealth: Payer: Self-pay | Admitting: Internal Medicine

## 2021-05-20 DIAGNOSIS — G8929 Other chronic pain: Secondary | ICD-10-CM

## 2021-05-20 MED ORDER — TRAMADOL HCL 50 MG PO TABS: 50.0000 mg | ORAL_TABLET | Freq: Three times a day (TID) | ORAL | 0 refills | Status: DC | PRN

## 2021-05-20 MED ORDER — TIZANIDINE HCL 4 MG PO TABS: 4.0000 mg | ORAL_TABLET | Freq: Three times a day (TID) | ORAL | 0 refills | Status: DC | PRN

## 2021-05-20 MED ORDER — DONEPEZIL HCL 10 MG PO TABS: 5.0000 mg | ORAL_TABLET | Freq: Two times a day (BID) | ORAL | 0 refills | Status: DC

## 2021-05-20 NOTE — Telephone Encounter (Signed)
donepezil (ARICEPT) 10 MG was last refilled on 01/27/21 quantity of 30 with 2 refills    traMADol (ULTRAM) 50 MG tablet was last refilled 03/20/21 quantity of 60   tiZANidine (ZANAFLEX) 4 MG tablet was last refilled 04/07/21 quanity of 30     QUEtiapine (SEROQUEL) was last refilled 04/10/21 quantity of 90 with 1 refill    Contacted pt and lvm to make pt aware that some medications can not be pprescribed for a 3 month supply and I will send her refill request to the covering provider and if she has any questions or concern to give [place] a call

## 2021-05-20 NOTE — Telephone Encounter (Signed)
donepezil (ARICEPT) 10 MG tablet [[id number]]   traMADol (ULTRAM) 50 MG tablet [[id number]]   tiZANidine (ZANAFLEX) 4 MG tablet [[id number]]    QUEtiapine (SEROQUEL) 50 MG tablet [[id number]]   traMADol (ULTRAM) 50 MG tablet [[id number]]     [place], [place] - [address] [place], [place] [address]  Phone:  [phone]  Fax:  [phone]     Pt requesting 3 month supply PLEASE.

## 2021-05-22 ENCOUNTER — Telehealth: Payer: Self-pay | Admitting: Gastroenterology

## 2021-05-22 NOTE — Telephone Encounter (Signed)
Received message from Dr. Jenetta Downer notifying me that patient was undergoing EGD/EUS with Dr. Rush Landmark on 06/23/2021.  Appears that she called our office in September and canceled her follow-up stating she was transferring her care to [facility] due to our scheduling being out too late.  However, it appears Dr. Rush Landmark has recommended she keep her primary GI care with [facility] and see him as a Optometrist.   She is not currently scheduled for a follow-up with our office.  Can we reach out to her to see if she would like to arrange a follow-up as it doesn't appear Dr. Rush Landmark will be taking her on as a new patient?

## 2021-05-22 NOTE — Progress Notes (Signed)
[place], [place] [address]   CLINIC:  Medical Oncology/Hematology  PCP:  Ladell Pier, MD [place] [address] [phone]   REASON FOR VISIT:  Follow-up for normocytic anemia with history of bilateral pulmonary embolism  PRIOR THERAPY: Xarelto for PE; PRBC transfusion for GI bleed anemia  CURRENT THERAPY: Intermittent IV iron (Venofer x5 from 12/31/2020 through 01/17/2021), not on any current anticoagulation  INTERVAL HISTORY:  Maria Murphy 65 y.o. female returns for routine follow-up of her normocytic anemia with history of bilateral pulmonary embolism.  She was last seen by Tarri Abernethy PA-C on 02/14/2021.  At today's visit, she reports feeling fairly well.  She denies any recent hospitalizations, new diagnoses, or changes in her baseline health status.  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  She reports that she is unable to tolerate any solids without vomiting them back up, and is on an entirely liquid diet.  She tries to maintain her caloric intake and drinks 2 Ensure beverages per day.  She is following with gastroenterology, and reports that she has a procedure to correct her stricture scheduled for 06/23/2021.  Due to her inability to tolerate solid food, she has lost about 50 pounds in the past year.  She denies any current signs or symptoms of blood loss, no gross rectal hemorrhage or melena. She denies any chest pain, dyspnea on exertion, dizziness, or syncope. She continues to take iron supplementation at home.  She reports some moderate fatigue with energy level at about 40%, secondary to her GI issues.  No current signs or symptoms concerning for recurrent PE or DVT, no unilateral leg swelling/erythema/pain, dyspnea on exertion, syncope.     REVIEW OF SYSTEMS:  Review of Systems   Constitutional:  Positive for appetite change (40% appetite, unable to tolerate solids due to gastric stricture) and fatigue (40% energy). Negative for chills, diaphoresis, fever and unexpected weight change.  HENT:   Negative for lump/mass and nosebleeds.   Eyes:  Negative for eye problems.  Respiratory:  Negative for cough, hemoptysis and shortness of breath.   Cardiovascular:  Positive for palpitations (Reports that her heart "goes in and out of rhythm"). Negative for chest pain and leg swelling.  Gastrointestinal:  Positive for abdominal pain (Intermittent, exacerbated by eating solid food), nausea and vomiting. Negative for blood in stool, constipation and diarrhea.  Genitourinary:  Negative for hematuria.   Skin: Negative.   Neurological:  Positive for headaches. Negative for dizziness, light-headedness and numbness.  Hematological:  Does not bruise/bleed easily.  Psychiatric/Behavioral:  Positive for depression and sleep disturbance. The patient is not nervous/anxious.      PAST MEDICAL/SURGICAL HISTORY:  Past Medical History:  Diagnosis Date   Acute saddle pulmonary embolism without acute cor pulmonale (HCC)    Basal cell carcinoma 01/16/2021   L nose, MOHs 04/14/21   Collagen vascular disease ([place])    Duodenal ulcer    Essential hypertension, benign 01/24/2015   Family history of ovarian cancer 01/06/2021   Family history of pancreatic cancer 01/06/2021   Family history of uterine cancer 01/06/2021   Foot ulcer ([place])    GERD (gastroesophageal reflux disease)    Hypertension    Memory loss    Peptic ulcer with perforation ([place])    Perforated abdominal viscus 04/22/2020   PUD (peptic ulcer disease)    with perforation  s/p exploratory laparotomy   Pulmonary embolus ([place]) 04/2020   Skin cancer    Suicidal ideations 08/07/2020   Vision abnormalities    Past Surgical History:  Procedure Laterality Date   BIOPSY  11/20/2020   Procedure: BIOPSY;  Surgeon: Rush Landmark  Telford Nab., MD;  Location: [facility];  Service: Gastroenterology;;   CENTRAL LINE INSERTION Right 04/22/2020   Procedure: CENTRAL LINE INSERTION;  Surgeon: Virl Cagey, MD;  Location: [facility];  Service: General;  Laterality: Right;   DIAGNOSTIC LAPAROSCOPY     ESOPHAGOGASTRODUODENOSCOPY N/A 11/20/2020   Procedure: ESOPHAGOGASTRODUODENOSCOPY (EGD);  Surgeon: Irving Copas., MD;  Location: [facility];  Service: Gastroenterology;  Laterality: N/A;   ESOPHAGOGASTRODUODENOSCOPY (EGD) WITH PROPOFOL N/A 07/10/2020   Procedure: ESOPHAGOGASTRODUODENOSCOPY (EGD) WITH PROPOFOL;  Surgeon: Rogene Houston, MD;  [facility];  Service: Endoscopy;  Laterality: N/A;   ESOPHAGOGASTRODUODENOSCOPY (EGD) WITH PROPOFOL N/A 10/18/2020   Procedure: ESOPHAGOGASTRODUODENOSCOPY (EGD) WITH PROPOFOL;  Surgeon: Harvel Quale, MD;  [facility];  Service: Gastroenterology;  Laterality: N/A;   EUS N/A 11/20/2020   Procedure: UPPER ENDOSCOPIC ULTRASOUND (EUS) LINEAR;  Surgeon: Irving Copas., MD;  Location: [facility];  Service: Gastroenterology;  Laterality: N/A;   GASTRORRHAPHY  04/22/2020   Procedure: GASTRORRHAPHY;  Surgeon: Virl Cagey, MD;  Location: [facility];  Service: General;;   IR CATHETER TUBE CHANGE  05/09/2020   LAPAROTOMY N/A 04/22/2020   Procedure: EXPLORATORY LAPAROTOMY;  Surgeon: Virl Cagey, MD;  Location: [facility];  Service: General;  Laterality: N/A;   TONSILLECTOMY       SOCIAL HISTORY:  Social History   Socioeconomic History   Marital status: Widowed    Spouse name: Not on file   Number of children: 2   Years of education: Not on file   Highest education level: Not on file  Occupational History   Not on file  Tobacco Use   Smoking status: Never   Smokeless tobacco: Never  Vaping Use   Vaping Use: Never used  Substance and Sexual Activity   Alcohol use: Yes    Alcohol/week: 2.0 - 3.0 standard drinks    Types: 2 - 3  Glasses of wine per week   Drug use: No   Sexual activity: Not Currently    Birth control/protection: Post-menopausal  Other Topics Concern   Not on file  Social History Narrative   Not on file   Social Determinants of Health   Financial Resource Strain: Not on file  Food Insecurity: Not on file  Transportation Needs: No Transportation Needs   Lack of Transportation (Medical): No   Lack of Transportation (Non-Medical): No  Physical Activity: Inactive   Days of Exercise per Week: 0 days   Minutes of Exercise per Session: 0 min  Stress: Not on file  Social Connections: Not on file  Intimate Partner Violence: Not At Risk   Fear of Current or Ex-Partner: No   Emotionally Abused: No   Physically Abused: No   Sexually Abused: No    FAMILY HISTORY:  Family History  Problem Relation Age of Onset   High Cholesterol Mother    Hypertension Mother    Arthritis/Rheumatoid Mother    Pancreatic cancer Mother 53   Alcohol abuse Father    Hypertension Maternal Grandmother    Uterine cancer Maternal Grandmother 87   Ovarian cancer Cousin        before age 86   Colon cancer Neg Hx    Colon polyps  Neg Hx    Inflammatory bowel disease Neg Hx     CURRENT MEDICATIONS:  Outpatient Encounter Medications as of 05/23/2021  Medication Sig   acetaminophen (TYLENOL) 500 MG tablet Take 1 tablet (500 mg total) by mouth every 6 (six) hours as needed for mild pain or fever.   amLODipine (NORVASC) 2.5 MG tablet Take 1 tablet (2.5 mg total) by mouth daily. For BP   dicyclomine (BENTYL) 10 MG capsule Take 10 mg by mouth 2 (two) times daily.   donepezil (ARICEPT) 10 MG tablet Take 0.5-1 tablets (5-10 mg total) by mouth in the morning and at bedtime.   gabapentin (NEURONTIN) 300 MG capsule Take 1 capsule (300 mg total) by mouth 2 (two) times daily.   lactose free nutrition (BOOST) LIQD Take 237 mLs by mouth 2 (two) times daily.   loperamide (IMODIUM) 2 MG capsule Take 1 capsule (2 mg total) by mouth 2  (two) times daily as needed for diarrhea or loose stools.   ondansetron (ZOFRAN-ODT) 8 MG disintegrating tablet Take 1 tablet (8 mg total) by mouth every 8 (eight) hours as needed for nausea or vomiting.   pantoprazole (PROTONIX) 40 MG tablet Take 1 tablet (40 mg total) by mouth 2 (two) times daily.   QUEtiapine (SEROQUEL) 50 MG tablet Take 1 tablet (50 mg total) by mouth at bedtime.   sucralfate (CARAFATE) 1 GM/10ML suspension Take 10 mLs (1 g total) by mouth 4 (four) times daily -  with meals and at bedtime.   tiZANidine (ZANAFLEX) 4 MG tablet Take 1 tablet (4 mg total) by mouth every 8 (eight) hours as needed for muscle spasms.   traMADol (ULTRAM) 50 MG tablet Take 1 tablet (50 mg total) by mouth every 8 (eight) hours as needed.   traZODone (DESYREL) 100 MG tablet Take 1 tablet (100 mg total) by mouth at bedtime.   No facility-administered encounter medications on file as of 05/23/2021.    ALLERGIES:  Allergies  Allergen Reactions   Wheat Extract Swelling   Ketamine Other (See Comments)    Agitation, confusion, hypertension, tachycardia     PHYSICAL EXAM:  ECOG PERFORMANCE STATUS: 1 - Symptomatic but completely ambulatory  There were no vitals filed for this visit. There were no vitals filed for this visit. Physical Exam Constitutional:      Appearance: Normal appearance.  HENT:     Head: Normocephalic and atraumatic.     Mouth/Throat:     Mouth: Mucous membranes are moist.  Eyes:     Extraocular Movements: Extraocular movements intact.     Pupils: Pupils are equal, round, and reactive to light.  Cardiovascular:     Rate and Rhythm: Normal rate and regular rhythm.     Pulses: Normal pulses.     Heart sounds: Normal heart sounds.  Pulmonary:     Effort: Pulmonary effort is normal.     Breath sounds: Normal breath sounds.  Abdominal:     General: Bowel sounds are normal.     Palpations: Abdomen is soft.     Tenderness: There is no abdominal tenderness.  Musculoskeletal:         General: No swelling.     Right lower leg: Edema (Trace ankle edema) present.     Left lower leg: Edema (Trace ankle edema) present.  Lymphadenopathy:     Cervical: No cervical adenopathy.  Skin:    General: Skin is warm and dry.  Neurological:     General: No focal deficit present.  Mental Status: She is alert and oriented to person, place, and time.  Psychiatric:        Mood and Affect: Mood normal.        Behavior: Behavior normal.     LABORATORY DATA:  I have reviewed the labs as listed.  CBC    Component Value Date/Time   WBC 4.6 05/16/2021 0914   RBC 4.16 05/16/2021 0914   HGB 12.0 05/16/2021 0914   HCT 36.8 05/16/2021 0914   HCT 33.7 (L) 12/20/2020 1302   PLT 220 05/16/2021 0914   MCV 88.5 05/16/2021 0914   MCH 28.8 05/16/2021 0914   MCHC 32.6 05/16/2021 0914   RDW 15.4 05/16/2021 0914   LYMPHSABS 1.2 05/16/2021 0914   MONOABS 0.4 05/16/2021 0914   EOSABS 0.1 05/16/2021 0914   BASOSABS 0.0 05/16/2021 0914   CMP Latest Ref Rng & Units 05/13/2021 02/16/2021 01/25/2021  Glucose 70 - 99 mg/dL 121(H) 114(H) 120(H)  BUN 8 - 23 mg/dL 11 20 12   Creatinine 0.44 - 1.00 mg/dL 0.84 0.78 0.78  Sodium 135 - 145 mmol/L 136 137 137  Potassium 3.5 - 5.1 mmol/L 3.2(L) 4.2 3.8  Chloride 98 - 111 mmol/L 98 106 104  CO2 22 - 32 mmol/L 24 23 26   Calcium 8.9 - 10.3 mg/dL 9.6 8.7(L) 9.2  Total Protein 6.5 - 8.1 g/dL 7.9 6.5 7.6  Total Bilirubin 0.3 - 1.2 mg/dL 0.8 0.6 0.6  Alkaline Phos 38 - 126 U/L 58 43 55  AST 15 - 41 U/L 18 19 15   ALT 0 - 44 U/L 8 10 7     DIAGNOSTIC IMAGING:  I have independently reviewed the relevant imaging and discussed with the patient.  ASSESSMENT: 1.  History of bilateral pulmonary embolism - Bilateral PE with right heart strain in September 2021, which occurred during ulcer repair which suggests likely provoked episode - Patient was on Xarelto for Close to 6 months, then had GI bleeding - hence anticoagulation was held and hypercoagulable  work-up was ordered - Hypercoagulable work-up in March 2022 was negative - Most recent CT with PE protocol was negative - This was single episode of likely provoked PE, although it was high burden PE causing right heart strain; she has GI ulcers that intermittently bleeding; therefore, do not recommend long-term anticoagulation at this time - PLAN: No intervention or anticoagulation recommended at this time.  If patient has another episode of DVT/PE in the future, we will discuss with her again any further recommendations or changes to the above plan  2.  Iron deficiency anemia - Patient had GI bleed in March 2022 while on anticoagulation for pulmonary embolism, received PRBC transfusion x1 - She has longstanding history of peptic ulcers that intermittently bleed, most recent EGD 10/18/2020 showed erosive gastropathy with gastric ulcer and duodenal ulcer without any current stigmata of bleeding -Work-up for anemia initiated at last visit in April 2022 showed Hgb 10.6, ferritin 5, iron saturation 6%; vitamin B12, folate, LDH, SPEP/light chains normal - Suspect that she had persistent anemia and iron deficiency secondary to GI bleed in March 2022 - She continues to follow with GI  - She received IV iron with Venofer x5 from 12/31/2020 through 01/17/2021 -She continues to take oral ferrous sulfate 325 mg daily at home.   - She denies any current signs or symptoms of gross rectal hemorrhage or melena, has mildly dark stool secondary to oral iron supplementation  - Most recent labs (05/13/2021): Hemoglobin 13.3, ferritin 194, iron saturation 18%  with normal TIBC - Most recent labs (CBC on 01/25/2021) show resolution of anemia with Hgb 12.0/MCV 85.5 - PLAN: No indication for IV iron at this time.  Continue ferrous sulfate daily at home.  Repeat CBC and iron panel in 4 months with phone visit.   3.  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 Charlynne Cousins, we dont have any information that she has pancreatic cancer at this time. - Patient's mother had pancreatic cancer at age 19 - She was referred for genetic counseling (see note from genetic counselor dated 01/15/2021) - PLAN: Continue follow-up as above  4.  Palpitations - Patient reports that she feels her heart go "out of rhythm" - She has never seen a cardiologist, per her report - PLAN: Referral sent to cardiologist for further work-up and possible treatment.  Patient educated on alarm symptoms that would prompt immediate medical attention at the ED.   PLAN SUMMARY & DISPOSITION: - Referral to cardiology - Repeat labs and RTC in 4 months  All questions were answered. The patient knows to call the clinic with any problems, questions or concerns.  Medical decision making: Low  Time spent on visit: I spent 15 minutes counseling the patient face to face. The total time spent in the appointment was 25 minutes and more than 50% was on counseling.   Harriett Rush, PA-C  05/23/2021 10:24 AM

## 2021-05-22 NOTE — Telephone Encounter (Signed)
Pt is scheduled for the EGD/EUS on 06/23/2021. Would like to follow up with [place] afterwards. Pt will need a follow up appointment with [place] sometime after 06/23/2021.

## 2021-05-22 NOTE — Telephone Encounter (Signed)
Noted  

## 2021-05-22 NOTE — Telephone Encounter (Signed)
Communication noted. Please arrange follow-up.

## 2021-05-22 NOTE — Telephone Encounter (Signed)
See note from 04/29/2021. Pt transferred care to [facility]. [facility] has recommended that she stay with [place]. Please advise. Thanks.

## 2021-05-23 ENCOUNTER — Other Ambulatory Visit: Payer: Self-pay

## 2021-05-23 ENCOUNTER — Inpatient Hospital Stay (HOSPITAL_BASED_OUTPATIENT_CLINIC_OR_DEPARTMENT_OTHER): Payer: Medicaid Other | Admitting: Physician Assistant

## 2021-05-23 VITALS — BP 130/74 | HR 63 | Temp 98.4°F | Resp 16 | Wt 164.7 lb

## 2021-05-23 DIAGNOSIS — D500 Iron deficiency anemia secondary to blood loss (chronic): Secondary | ICD-10-CM

## 2021-05-23 DIAGNOSIS — I499 Cardiac arrhythmia, unspecified: Secondary | ICD-10-CM

## 2021-05-23 DIAGNOSIS — D509 Iron deficiency anemia, unspecified: Secondary | ICD-10-CM | POA: Diagnosis not present

## 2021-05-23 DIAGNOSIS — I2699 Other pulmonary embolism without acute cor pulmonale: Secondary | ICD-10-CM

## 2021-05-23 NOTE — Patient Instructions (Signed)
[place] at [facility] Discharge Instructions  You were seen today by Tarri Abernethy PA-C for your iron deficiency anemia and your history of blood clots.  Your blood and iron levels looks great today!  Keep taking your iron pill once a day.  You do not need any IV iron at this time.  We will check your labs again in 4 months, but if you have any major episodes of bleeding or begin to feel fatigued, please call our office and we will get you set up for labs and an office visit sooner.  As we discussed, you do not need any ongoing anticoagulation (blood thinner) for your history of blood clots.  However, if you have any blood clots in the future, please let us know immediately, as we may need to put you on indefinite anticoagulation at that time.  You mention today that your heart sometimes feels out of rhythm.  This may be a sign of underlying heart abnormalities, possibly a rhythm known as atrial fibrillation.  It is very important that you see a heart doctor for testing and possible treatments.  A referral has been sent to Dr. Domenic Polite, the cardiologist who practices medicine here in [place].  LABS: Return in 4 months for repeat labs  OTHER TESTS: None at this time  MEDICATIONS: Continue to take iron pill once daily.  No other changes to home medications.  FOLLOW-UP APPOINTMENT: Office visit in 4 months, after labs   Thank you for choosing [place] at [facility] to provide your oncology and hematology care.  To afford each patient quality time with our provider, please arrive at least 15 minutes before your scheduled appointment time.   If you have a lab appointment with the [facility] please come in thru the Main Entrance and check in at the main information desk.  You need to re-schedule your appointment should you arrive 10 or more minutes late.  We strive to give you quality time with our providers, and arriving late affects  you and other patients whose appointments are after yours.  Also, if you no show three or more times for appointments you may be dismissed from the clinic at the providers discretion.     Again, thank you for choosing [facility].  Our hope is that these requests will decrease the amount of time that you wait before being seen by our physicians.       _____________________________________________________________  Should you have questions after your visit to [facility], please contact our office at [phone] and follow the prompts.  Our office hours are 8:00 a.m. and 4:30 p.m. Monday - Friday.  Please note that voicemails left after 4:00 p.m. may not be returned until the following business day.  We are closed weekends and major holidays.  You do have access to a nurse 24-7, just call the main number to the clinic [phone] and do not press any options, hold on the line and a nurse will answer the phone.    For prescription refill requests, have your pharmacy contact our office and allow 72 hours.    Due to Covid, you will need to wear a mask upon entering the hospital. If you do not have a mask, a mask will be given to you at the Main Entrance upon arrival. For doctor visits, patients may have 1 support person age 79 or older with them. For treatment visits, patients can not  have anyone with them due to social distancing guidelines and our immunocompromised population.

## 2021-06-04 ENCOUNTER — Telehealth: Payer: Self-pay | Admitting: Internal Medicine

## 2021-06-04 NOTE — Telephone Encounter (Signed)
Spoke to pt. She informed me that she has been having pain on her right side about half hour after she eats. This is been going on for about 10 days. She stated that she has stopped eating because of the pain and is only drinking liquids. Would like to know recommendations on what to do?

## 2021-06-04 NOTE — Telephone Encounter (Signed)
Tried calling patient to discuss further. Left message requesting return call.

## 2021-06-04 NOTE — Telephone Encounter (Signed)
[phone] PATIENT WOULD LIKE TO SPEAK TO A NURSE ABOUT HER CARE

## 2021-06-06 ENCOUNTER — Other Ambulatory Visit: Payer: Self-pay | Admitting: Gastroenterology

## 2021-06-06 DIAGNOSIS — K269 Duodenal ulcer, unspecified as acute or chronic, without hemorrhage or perforation: Secondary | ICD-10-CM

## 2021-06-06 MED ORDER — LANSOPRAZOLE 30 MG PO TBDD: DELAYED_RELEASE_TABLET | ORAL | 3 refills | Status: DC

## 2021-06-06 NOTE — Telephone Encounter (Signed)
Spoke with patient today.  She states after eating any solid foods or taking pills, she is getting significant abdominal cramping, associated nausea without vomiting.  This is similar to prior symptoms. States she is following a liquid diet currently and seems to be doing well with that.  She is no longer taking a PPI due to abdominal pain with pills as well.   Notably, patient has known history of duodenal ulcer, gastric ulcer, duodenal stenosis and is scheduled for EGD/EUS on October 31 with Dr. Rush Landmark.  I suspect her symptoms are likely secondary to gastric/duodenal ulcer and duodenal stenosis.    As she is not able to tolerate solid foods, recommended she continue liquid/full liquid diet.  Encouraged adding Ensure/Boost or other protein shakes/smoothies 2-3 times a day to maintain adequate nutrition.  Advised if she is unable to tolerate oral intake, she would need to proceed to the emergency room.  Additionally, as she is unable to tolerate PPI tablet, I have sent in Prevacid disintegrating tablet for her.  She will call back if she has any further questions or concerns.

## 2021-06-09 ENCOUNTER — Telehealth: Payer: Self-pay | Admitting: Gastroenterology

## 2021-06-09 NOTE — Telephone Encounter (Signed)
PA for lansoprazole 30 mg Tab rap dr was approved. Approval letter will be scanned into patients chart.

## 2021-06-12 ENCOUNTER — Encounter (HOSPITAL_COMMUNITY): Payer: Self-pay | Admitting: Gastroenterology

## 2021-06-19 ENCOUNTER — Encounter: Payer: Self-pay | Admitting: Critical Care Medicine

## 2021-06-19 ENCOUNTER — Ambulatory Visit: Payer: Medicaid Other | Attending: Critical Care Medicine | Admitting: Critical Care Medicine

## 2021-06-19 ENCOUNTER — Other Ambulatory Visit: Payer: Self-pay

## 2021-06-19 VITALS — BP 162/101 | HR 72 | Resp 16 | Wt 162.8 lb

## 2021-06-19 DIAGNOSIS — R109 Unspecified abdominal pain: Secondary | ICD-10-CM

## 2021-06-19 DIAGNOSIS — G8929 Other chronic pain: Secondary | ICD-10-CM

## 2021-06-19 DIAGNOSIS — I824Y9 Acute embolism and thrombosis of unspecified deep veins of unspecified proximal lower extremity: Secondary | ICD-10-CM

## 2021-06-19 DIAGNOSIS — K8020 Calculus of gallbladder without cholecystitis without obstruction: Secondary | ICD-10-CM

## 2021-06-19 DIAGNOSIS — G629 Polyneuropathy, unspecified: Secondary | ICD-10-CM

## 2021-06-19 DIAGNOSIS — F332 Major depressive disorder, recurrent severe without psychotic features: Secondary | ICD-10-CM

## 2021-06-19 DIAGNOSIS — I10 Essential (primary) hypertension: Secondary | ICD-10-CM

## 2021-06-19 DIAGNOSIS — Z23 Encounter for immunization: Secondary | ICD-10-CM

## 2021-06-19 DIAGNOSIS — Z79891 Long term (current) use of opiate analgesic: Secondary | ICD-10-CM

## 2021-06-19 DIAGNOSIS — K315 Obstruction of duodenum: Secondary | ICD-10-CM

## 2021-06-19 MED ORDER — TRAZODONE HCL 100 MG PO TABS: 100.0000 mg | ORAL_TABLET | Freq: Every day | ORAL | 1 refills | Status: DC

## 2021-06-19 MED ORDER — AMLODIPINE BESYLATE 5 MG PO TABS: 5.0000 mg | ORAL_TABLET | Freq: Every day | ORAL | 2 refills | Status: DC

## 2021-06-19 MED ORDER — GABAPENTIN 300 MG PO CAPS: 300.0000 mg | ORAL_CAPSULE | Freq: Two times a day (BID) | ORAL | 6 refills | Status: DC

## 2021-06-19 MED ORDER — TRAMADOL HCL 50 MG PO TABS: 50.0000 mg | ORAL_TABLET | Freq: Three times a day (TID) | ORAL | 0 refills | Status: DC | PRN

## 2021-06-19 MED ORDER — CITALOPRAM HYDROBROMIDE 20 MG PO TABS: 20.0000 mg | ORAL_TABLET | Freq: Every day | ORAL | 3 refills | Status: DC

## 2021-06-19 MED ORDER — DULOXETINE HCL 40 MG PO CPEP: 1.0000 | ORAL_CAPSULE | Freq: Every day | ORAL | 4 refills | Status: DC

## 2021-06-19 MED ORDER — QUETIAPINE FUMARATE 50 MG PO TABS: 50.0000 mg | ORAL_TABLET | Freq: Every day | ORAL | 1 refills | Status: DC

## 2021-06-19 MED ORDER — LIDOCAINE 5 % EX PTCH: 1.0000 | MEDICATED_PATCH | CUTANEOUS | 0 refills | Status: DC

## 2021-06-19 NOTE — Progress Notes (Signed)
Established Patient Office Visit  Subjective:  Patient ID: Maria Murphy, female    DOB: 1956/02/11  Age: 65 y.o. MRN: [id number]  CC:  Chief Complaint  Patient presents with   Hypertension    HPI Maria Murphy presents for primary care follow-up.  Note patient wishes to switch to Dr. Joya Gaskins for primary care.  Patient has a history of gallstone without complaint referable to the gallstone.  Patient did have a duodenal ulcer now has stricture formation based on a recent imaging study in the duodenum.  She has follow-up with gastroenterology scheduled.  She complains of lower back pain as well with right lower extremity weakness.  She is due to have a total knee replacement on the right but this is pending.  Patient does complain of abdominal pain with bloating.  She uses tramadol for pain.  On arrival blood pressure is elevated 162/101 Prior history of pulmonary emboli that has resolved and now off anticoagulant therapy without evidence of recurrence  Back pain is been worse since she ran out of duloxetine Patient remains very depressed over her health is on Celexa needs refills this has been helpful Past Medical History:  Diagnosis Date   Acute saddle pulmonary embolism without acute cor pulmonale (HCC)    Basal cell carcinoma 01/16/2021   L nose, MOHs 04/14/21   Collagen vascular disease ([place])    Duodenal ulcer    Essential hypertension, benign 01/24/2015   Family history of ovarian cancer 01/06/2021   Family history of pancreatic cancer 01/06/2021   Family history of uterine cancer 01/06/2021   Foot ulcer (HCC)    GERD (gastroesophageal reflux disease)    Hypertension    Memory loss    Peptic ulcer with perforation ([place])    Perforated abdominal viscus 04/22/2020   PUD (peptic ulcer disease)    with perforation s/p exploratory laparotomy   Pulmonary embolus ([place]) 04/2020   Skin cancer    Suicidal ideations 08/07/2020   Vision abnormalities     Past Surgical History:   Procedure Laterality Date   BIOPSY  11/20/2020   Procedure: BIOPSY;  Surgeon: Irving Copas., MD;  Location: [facility];  Service: Gastroenterology;;   CENTRAL LINE INSERTION Right 04/22/2020   Procedure: CENTRAL LINE INSERTION;  Surgeon: Virl Cagey, MD;  Location: [facility];  Service: General;  Laterality: Right;   DIAGNOSTIC LAPAROSCOPY     ESOPHAGOGASTRODUODENOSCOPY N/A 11/20/2020   Procedure: ESOPHAGOGASTRODUODENOSCOPY (EGD);  Surgeon: Irving Copas., MD;  Location: [facility];  Service: Gastroenterology;  Laterality: N/A;   ESOPHAGOGASTRODUODENOSCOPY (EGD) WITH PROPOFOL N/A 07/10/2020   Procedure: ESOPHAGOGASTRODUODENOSCOPY (EGD) WITH PROPOFOL;  Surgeon: Rogene Houston, MD;  [facility]: [facility];  Service: Endoscopy;  Laterality: N/A;   ESOPHAGOGASTRODUODENOSCOPY (EGD) WITH PROPOFOL N/A 10/18/2020   Procedure: ESOPHAGOGASTRODUODENOSCOPY (EGD) WITH PROPOFOL;  Surgeon: Harvel Quale, MD;  Location: [facility];  Service: Gastroenterology;  Laterality: N/A;   EUS N/A 11/20/2020   Procedure: UPPER ENDOSCOPIC ULTRASOUND (EUS) LINEAR;  Surgeon: Irving Copas., MD;  Location: [facility];  Service: Gastroenterology;  Laterality: N/A;   GASTRORRHAPHY  04/22/2020   Procedure: GASTRORRHAPHY;  Surgeon: Virl Cagey, MD;  Location: [facility];  Service: General;;   IR CATHETER TUBE CHANGE  05/09/2020   LAPAROTOMY N/A 04/22/2020   Procedure: EXPLORATORY LAPAROTOMY;  Surgeon: Virl Cagey, MD;  Location: [facility];  Service: General;  Laterality: N/A;   TONSILLECTOMY      Family History  Problem Relation Age of Onset  High Cholesterol Mother    Hypertension Mother    Arthritis/Rheumatoid Mother    Pancreatic cancer Mother 1   Alcohol abuse Father    Hypertension Maternal Grandmother    Uterine cancer Maternal Grandmother 20   Ovarian cancer Cousin        before age 65   Colon cancer Neg Hx    Colon polyps Neg Hx    Inflammatory bowel  disease Neg Hx     Social History   Socioeconomic History   Marital status: Widowed    Spouse name: Not on file   Number of children: 2   Years of education: Not on file   Highest education level: Not on file  Occupational History   Not on file  Tobacco Use   Smoking status: Never   Smokeless tobacco: Never  Vaping Use   Vaping Use: Never used  Substance and Sexual Activity   Alcohol use: Yes    Alcohol/week: 2.0 - 3.0 standard drinks    Types: 2 - 3 Glasses of wine per week   Drug use: No   Sexual activity: Not Currently    Birth control/protection: Post-menopausal  Other Topics Concern   Not on file  Social History Narrative   Not on file   Social Determinants of Health   Financial Resource Strain: Not on file  Food Insecurity: Not on file  Transportation Needs: No Transportation Needs   Lack of Transportation (Medical): No   Lack of Transportation (Non-Medical): No  Physical Activity: Inactive   Days of Exercise per Week: 0 days   Minutes of Exercise per Session: 0 min  Stress: Not on file  Social Connections: Not on file  Intimate Partner Violence: Not At Risk   Fear of Current or Ex-Partner: No   Emotionally Abused: No   Physically Abused: No   Sexually Abused: No    Outpatient Medications Prior to Visit  Medication Sig Dispense Refill   acetaminophen (TYLENOL) 500 MG tablet Take 1 tablet (500 mg total) by mouth every 6 (six) hours as needed for mild pain or fever.     Ascorbic Acid (VITAMIN C PO) Take 1 tablet by mouth daily.     donepezil (ARICEPT) 10 MG tablet Take 0.5-1 tablets (5-10 mg total) by mouth in the morning and at bedtime. (Patient taking differently: Take 10 mg by mouth in the morning and at bedtime.) 180 tablet 0   ferrous sulfate 325 (65 FE) MG tablet Take 325 mg by mouth in the morning.     lactose free nutrition (BOOST) LIQD Take 237 mLs by mouth 2 (two) times daily. Boost Breeze Clear     lansoprazole (PREVACID SOLUTAB) 30 MG  disintegrating tablet Place 1 tablet on tongue twice daily and allow to dissolve until particles can be swallowed. Do not swallow whole, break, cut, or chew tablet. 60 tablet 3   loperamide (IMODIUM) 2 MG capsule Take 1 capsule (2 mg total) by mouth 2 (two) times daily as needed for diarrhea or loose stools. 14 capsule 0   ondansetron (ZOFRAN-ODT) 8 MG disintegrating tablet Take 1 tablet (8 mg total) by mouth every 8 (eight) hours as needed for nausea or vomiting. 20 tablet 0   sucralfate (CARAFATE) 1 GM/10ML suspension Take 10 mLs (1 g total) by mouth 4 (four) times daily -  with meals and at bedtime. (Patient taking differently: Take 1 g by mouth 2 (two) times daily.) 420 mL 2   tiZANidine (ZANAFLEX) 4 MG tablet Take 1  tablet (4 mg total) by mouth every 8 (eight) hours as needed for muscle spasms. 30 tablet 0   amLODipine (NORVASC) 2.5 MG tablet Take 1 tablet (2.5 mg total) by mouth daily. For BP 90 tablet 3   citalopram (CELEXA) 20 MG tablet Take 20 mg by mouth at bedtime.     DULoxetine HCl 40 MG CPEP Take 1 capsule by mouth daily.     gabapentin (NEURONTIN) 300 MG capsule Take 1 capsule (300 mg total) by mouth 2 (two) times daily. 60 capsule 6   QUEtiapine (SEROQUEL) 50 MG tablet Take 1 tablet (50 mg total) by mouth at bedtime. 90 tablet 1   traMADol (ULTRAM) 50 MG tablet Take 1 tablet (50 mg total) by mouth every 8 (eight) hours as needed. (Patient taking differently: Take 50 mg by mouth 2 (two) times daily.) 60 tablet 0   traZODone (DESYREL) 100 MG tablet Take 1 tablet (100 mg total) by mouth at bedtime. 30 tablet 1   No facility-administered medications prior to visit.    Allergies  Allergen Reactions   Wheat Extract Swelling   Ketamine Other (See Comments)    Agitation, confusion, hypertension, tachycardia    ROS Review of Systems  Constitutional: Negative.   HENT: Negative.  Negative for ear pain, postnasal drip, rhinorrhea, sinus pressure, sore throat, trouble swallowing and  voice change.   Eyes: Negative.   Respiratory: Negative.  Negative for apnea, cough, choking, chest tightness, shortness of breath, wheezing and stridor.   Cardiovascular: Negative.  Negative for chest pain, palpitations and leg swelling.  Gastrointestinal:  Positive for abdominal pain. Negative for abdominal distention, nausea and vomiting.  Genitourinary: Negative.   Musculoskeletal:  Positive for gait problem. Negative for arthralgias and myalgias.       Chronic right knee pain  Skin: Negative.  Negative for rash.  Allergic/Immunologic: Negative.  Negative for environmental allergies and food allergies.  Neurological:  Negative for dizziness, syncope, weakness and headaches.  Hematological: Negative.  Negative for adenopathy. Does not bruise/bleed easily.  Psychiatric/Behavioral:  Positive for decreased concentration and dysphoric mood. Negative for agitation, sleep disturbance and suicidal ideas. The patient is not nervous/anxious.      Objective:    Physical Exam Vitals reviewed.  Constitutional:      Appearance: Normal appearance. She is well-developed. She is not diaphoretic.  HENT:     Head: Normocephalic and atraumatic.     Nose: No nasal deformity, septal deviation, mucosal edema or rhinorrhea.     Right Sinus: No maxillary sinus tenderness or frontal sinus tenderness.     Left Sinus: No maxillary sinus tenderness or frontal sinus tenderness.     Mouth/Throat:     Pharynx: No oropharyngeal exudate.  Eyes:     General: No scleral icterus.    Conjunctiva/sclera: Conjunctivae normal.     Pupils: Pupils are equal, round, and reactive to light.  Neck:     Thyroid: No thyromegaly.     Vascular: No carotid bruit or JVD.     Trachea: Trachea normal. No tracheal tenderness or tracheal deviation.  Cardiovascular:     Rate and Rhythm: Normal rate and regular rhythm.     Chest Wall: PMI is not displaced.     Pulses: Normal pulses. No decreased pulses.     Heart sounds: Normal  heart sounds, S1 normal and S2 normal. Heart sounds not distant. No murmur heard. No systolic murmur is present.  No diastolic murmur is present.    No friction rub. No  gallop. No S3 or S4 sounds.  Pulmonary:     Effort: No tachypnea, accessory muscle usage or respiratory distress.     Breath sounds: No stridor. No decreased breath sounds, wheezing, rhonchi or rales.  Chest:     Chest wall: No tenderness.  Abdominal:     General: Bowel sounds are normal. There is no distension.     Palpations: Abdomen is soft. Abdomen is not rigid.     Tenderness: There is no abdominal tenderness. There is no guarding or rebound.  Musculoskeletal:        General: Normal range of motion.     Cervical back: Normal range of motion and neck supple. No edema, erythema or rigidity. No muscular tenderness. Normal range of motion.     Comments: Bilateral lumbar muscle spasticity and tenderness  Lymphadenopathy:     Head:     Right side of head: No submental or submandibular adenopathy.     Left side of head: No submental or submandibular adenopathy.     Cervical: No cervical adenopathy.  Skin:    General: Skin is warm and dry.     Coloration: Skin is not pale.     Findings: No rash.     Nails: There is no clubbing.  Neurological:     Mental Status: She is alert and oriented to person, place, and time.     Sensory: No sensory deficit.  Psychiatric:        Speech: Speech normal.        Behavior: Behavior normal.    BP (!) 162/101   Pulse 72   Resp 16   Wt 162 lb 12.8 oz (73.8 kg)   SpO2 100%   BMI 27.94 kg/m  Wt Readings from Last 3 Encounters:  06/19/21 162 lb 12.8 oz (73.8 kg)  05/23/21 164 lb 11.2 oz (74.7 kg)  05/13/21 160 lb (72.6 kg)     Health Maintenance Due  Topic Date Due   Zoster Vaccines- Shingrix (1 of 2) Never done   COVID-19 Vaccine (3 - Moderna risk series) 01/10/2020    There are no preventive care reminders to display for this patient.  Lab Results  Component Value  Date   TSH 1.13 10/17/2020   Lab Results  Component Value Date   WBC 4.6 05/16/2021   HGB 12.0 05/16/2021   HCT 36.8 05/16/2021   MCV 88.5 05/16/2021   PLT 220 05/16/2021   Lab Results  Component Value Date   NA 136 05/13/2021   K 3.2 (L) 05/13/2021   CO2 24 05/13/2021   GLUCOSE 121 (H) 05/13/2021   BUN 11 05/13/2021   CREATININE 0.84 05/13/2021   BILITOT 0.8 05/13/2021   ALKPHOS 58 05/13/2021   AST 18 05/13/2021   ALT 8 05/13/2021   PROT 7.9 05/13/2021   ALBUMIN 4.2 05/13/2021   CALCIUM 9.6 05/13/2021   ANIONGAP 14 05/13/2021   No results found for: CHOL No results found for: HDL No results found for: LDLCALC No results found for: TRIG No results found for: CHOLHDL No results found for: HGBA1C    Assessment & Plan:   Problem List Items Addressed This Visit       Cardiovascular and Mediastinum   Essential hypertension    Hypertension not well controlled plan is to increase amlodipine to 10 mg daily      Relevant Medications   amLODipine (NORVASC) 5 MG tablet   RESOLVED: DVT (deep venous thrombosis) (HCC)    Deep venous thrombosis  has resolved      Relevant Medications   amLODipine (NORVASC) 5 MG tablet     Digestive   Cholelithiasis    Chronic gallstones will observe      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        Nervous and Auditory   Peripheral polyneuropathy   Relevant Medications   citalopram (CELEXA) 20 MG tablet   DULoxetine HCl 40 MG CPEP   gabapentin (NEURONTIN) 300 MG capsule   QUEtiapine (SEROQUEL) 50 MG tablet   traZODone (DESYREL) 100 MG tablet     Other   Depression    Significant depression we will refill duloxetine and continue citalopram      Relevant Medications   citalopram (CELEXA) 20 MG tablet   DULoxetine HCl 40 MG CPEP   QUEtiapine (SEROQUEL) 50 MG tablet   traZODone (DESYREL) 100 MG tablet   Chronic abdominal pain   Relevant Medications   citalopram (CELEXA) 20  MG tablet   DULoxetine HCl 40 MG CPEP   gabapentin (NEURONTIN) 300 MG capsule   traMADol (ULTRAM) 50 MG tablet   traZODone (DESYREL) 100 MG tablet   Opioid contract exists    Chronic pain management with tramadol has a chronic pain contract refill tramadol      Major depressive disorder, recurrent severe without psychotic features (HCC)    Severe depression refill Celexa      Relevant Medications   citalopram (CELEXA) 20 MG tablet   DULoxetine HCl 40 MG CPEP   traZODone (DESYREL) 100 MG tablet   Other Visit Diagnoses     Need for vaccination against Streptococcus pneumoniae    -  Primary   Relevant Orders   Pneumococcal conjugate vaccine 20-valent (Prevnar 20) (Completed)       Meds ordered this encounter  Medications   amLODipine (NORVASC) 5 MG tablet    Sig: Take 1 tablet (5 mg total) by mouth daily. For BP    Dispense:  90 tablet    Refill:  2   citalopram (CELEXA) 20 MG tablet    Sig: Take 1 tablet (20 mg total) by mouth at bedtime.    Dispense:  60 tablet    Refill:  3   DULoxetine HCl 40 MG CPEP    Sig: Take 1 capsule by mouth daily.    Dispense:  60 capsule    Refill:  4   gabapentin (NEURONTIN) 300 MG capsule    Sig: Take 1 capsule (300 mg total) by mouth 2 (two) times daily.    Dispense:  60 capsule    Refill:  6   QUEtiapine (SEROQUEL) 50 MG tablet    Sig: Take 1 tablet (50 mg total) by mouth at bedtime.    Dispense:  90 tablet    Refill:  1   traMADol (ULTRAM) 50 MG tablet    Sig: Take 1 tablet (50 mg total) by mouth every 8 (eight) hours as needed.    Dispense:  60 tablet    Refill:  0   traZODone (DESYREL) 100 MG tablet    Sig: Take 1 tablet (100 mg total) by mouth at bedtime.    Dispense:  30 tablet    Refill:  1   lidocaine (LIDODERM) 5 %    Sig: Place 1 patch onto the skin daily. Remove & Discard patch within 12 hours or as directed by MD    Dispense:  30 patch    Refill:  0  Prevnar 20 vaccine given Follow-up: No follow-ups on file.     Asencion Noble, MD

## 2021-06-19 NOTE — Assessment & Plan Note (Signed)
Chronic pain management with tramadol has a chronic pain contract refill tramadol

## 2021-06-19 NOTE — Assessment & Plan Note (Signed)
Hypertension not well controlled plan is to increase amlodipine to 10 mg daily

## 2021-06-19 NOTE — Patient Instructions (Signed)
You declined a flu vaccine.  Prevnar 20 vaccine given  Refills on medications sent to [facility]  Start lidocaine patch daily to lower back  Keep follow up orthopedics and gastroenterology appts  Return Dr Joya Gaskins 2 months

## 2021-06-19 NOTE — Assessment & Plan Note (Signed)
Post duodenal ulcer stricture follow-up per gastroenterology

## 2021-06-19 NOTE — Assessment & Plan Note (Signed)
Severe depression refill Celexa

## 2021-06-19 NOTE — Assessment & Plan Note (Signed)
Significant depression we will refill duloxetine and continue citalopram

## 2021-06-19 NOTE — Assessment & Plan Note (Signed)
Chronic gallstones will observe

## 2021-06-19 NOTE — Assessment & Plan Note (Signed)
Deep venous thrombosis has resolved

## 2021-06-23 ENCOUNTER — Ambulatory Visit (HOSPITAL_COMMUNITY): Admission: RE | Admit: 2021-06-23 | Payer: Medicaid Other | Source: Home / Self Care | Admitting: Gastroenterology

## 2021-06-23 ENCOUNTER — Encounter: Payer: Self-pay | Admitting: Gastroenterology

## 2021-06-23 ENCOUNTER — Encounter (HOSPITAL_COMMUNITY): Payer: Self-pay | Admitting: Anesthesiology

## 2021-06-23 ENCOUNTER — Telehealth: Payer: Self-pay | Admitting: Gastroenterology

## 2021-06-23 SURGERY — ESOPHAGOGASTRODUODENOSCOPY (EGD) WITH PROPOFOL
Anesthesia: Monitor Anesthesia Care

## 2021-06-23 NOTE — Telephone Encounter (Signed)
Inbound call from Endo at [facility] stated that this pt was a No Show for her procedure today. Please advise.

## 2021-06-23 NOTE — Telephone Encounter (Signed)
I spoke with the pt and she tells me that she thought her appt was changed from today to 11/30.  She received a letter in the mail.  I advised her that we had not changed her appt and I see no letters in the system where she was sent anything from our office.  I did offer to reschedule and she states she is in a store and will call back when she can.

## 2021-06-23 NOTE — Telephone Encounter (Signed)
Patty, Thank you for the update. We will offer her 1 last appointment and should she miss this next 1, she will need to seek care outside of the [facility] system. I think will be best as well that we obtain an updated cross-sectional CT since it has been almost 6 months.  With plan a CT abdomen pancreas protocol.  FYI Dr. Gala Romney and PA Jodi Mourning patient no showed to our appointment today. We will try to get her rescheduled for EGD/duodenal stricture dilation and attempt at EUS.  Thanks to all. GM

## 2021-06-23 NOTE — Anesthesia Preprocedure Evaluation (Deleted)
Anesthesia Evaluation    Reviewed: Allergy & Precautions, Patient's Chart, lab work & pertinent test results  History of Anesthesia Complications Negative for: history of anesthetic complications  Airway        Dental   Pulmonary PE          Cardiovascular hypertension, Pt. on medications      Neuro/Psych PSYCHIATRIC DISORDERS Depression  Neuromuscular disease    GI/Hepatic Neg liver ROS, PUD, GERD  ,  Endo/Other  negative endocrine ROS  Renal/GU negative Renal ROS     Musculoskeletal  (+) Arthritis ,   Abdominal   Peds  Hematology  (+) anemia ,   Anesthesia Other Findings   Reproductive/Obstetrics                             Anesthesia Physical  Anesthesia Plan  ASA: 3  Anesthesia Plan: MAC   Post-op Pain Management:    Induction:   PONV Risk Score and Plan: 2 and Ondansetron and Propofol infusion  Airway Management Planned: Simple Face Mask and Natural Airway  Additional Equipment:   Intra-op Plan:   Post-operative Plan:   Informed Consent:   Plan Discussed with: Anesthesiologist  Anesthesia Plan Comments:         Anesthesia Quick Evaluation

## 2021-06-24 ENCOUNTER — Other Ambulatory Visit: Payer: Self-pay

## 2021-06-24 DIAGNOSIS — K862 Cyst of pancreas: Secondary | ICD-10-CM

## 2021-06-24 NOTE — Telephone Encounter (Signed)
The pt has been rescheduled to 08/21/21 at 10 am at [facility] with GM.  The pt has been advised and re-instructed.  I have also entered another amb referral as well as sent the pt new instructions.

## 2021-06-24 NOTE — Telephone Encounter (Signed)
Patient returning call to schedule procedure at [facility]

## 2021-07-09 ENCOUNTER — Ambulatory Visit: Payer: Self-pay

## 2021-07-09 NOTE — Telephone Encounter (Addendum)
Pt. Reports she started having vomiting and diarrhea Monday. Vomiting 6 times a day and diarrhea is watery and yellow. "It's worse than usual." Reports she has had these issues and has "ulcers." States "none of my medicines are helping. I called the GI doctor, but they don't answer the phone." Declines an office visit. Requests "some medicines to stop the vomiting be sent to pharmacy." States Zofran is not helping. Please advise pt.    Answer Assessment - Initial Assessment Questions 1. VOMITING SEVERITY: "How many times have you vomited in the past 24 hours?"     - MILD:  1 - 2 times/day    - MODERATE: 3 - 5 times/day, decreased oral intake without significant weight loss or symptoms of dehydration    - SEVERE: 6 or more times/day, vomits everything or nearly everything, with significant weight loss, symptoms of dehydration      6 2. ONSET: "When did the vomiting begin?"      Monday 3. FLUIDS: "What fluids or food have you vomited up today?" "Have you been able to keep any fluids down?"     No 4. ABDOMINAL PAIN: "Are your having any abdominal pain?" If yes : "How bad is it and what does it feel like?" (e.g., crampy, dull, intermittent, constant)      Cramping 5. DIARRHEA: "Is there any diarrhea?" If Yes, ask: "How many times today?"      Yes - yellow water 6. CONTACTS: "Is there anyone else in the family with the same symptoms?"      No 7. CAUSE: "What do you think is causing your vomiting?"     Ulcers, pancreas  8. HYDRATION STATUS: "Any signs of dehydration?" (e.g., dry mouth [not only dry lips], too weak to stand) "When did you last urinate?"     No 9. OTHER SYMPTOMS: "Do you have any other symptoms?" (e.g., fever, headache, vertigo, vomiting blood or coffee grounds, recent head injury)     Dizziness 10. PREGNANCY: "Is there any chance you are pregnant?" "When was your last menstrual period?"       No  Protocols used: Vomiting-A-AH

## 2021-07-10 ENCOUNTER — Encounter (HOSPITAL_COMMUNITY): Payer: Medicaid Other

## 2021-07-10 MED ORDER — PROMETHAZINE HCL 12.5 MG PO TABS: 12.5000 mg | ORAL_TABLET | Freq: Three times a day (TID) | ORAL | 0 refills | Status: DC | PRN

## 2021-07-10 NOTE — Telephone Encounter (Signed)
I sent phenegran to pharmacy

## 2021-07-10 NOTE — Telephone Encounter (Signed)
See encounter

## 2021-07-10 NOTE — Addendum Note (Signed)
Addended by: Asencion Noble E on: 07/10/2021 12:20 PM   Modules accepted: Orders

## 2021-07-10 NOTE — Telephone Encounter (Signed)
Will forward to pcp

## 2021-07-11 NOTE — Telephone Encounter (Signed)
Called pt and let her know that about the rx

## 2021-07-22 ENCOUNTER — Ambulatory Visit: Admit: 2021-07-22 | Payer: Medicaid Other | Admitting: Orthopedic Surgery

## 2021-07-22 SURGERY — ARTHROPLASTY, KNEE, TOTAL
Anesthesia: Choice | Site: Knee | Laterality: Right

## 2021-07-23 ENCOUNTER — Ambulatory Visit: Payer: Medicaid Other | Admitting: Gastroenterology

## 2021-08-08 ENCOUNTER — Encounter (HOSPITAL_COMMUNITY): Payer: Self-pay | Admitting: Gastroenterology

## 2021-08-08 NOTE — Progress Notes (Signed)
Attempted to obtain medical history via telephone, unable to reach at this time. I left a voicemail to return pre surgical testing department's phone call.  

## 2021-08-12 ENCOUNTER — Encounter: Payer: Self-pay | Admitting: Hematology and Oncology

## 2021-08-20 ENCOUNTER — Other Ambulatory Visit: Payer: Self-pay

## 2021-08-20 ENCOUNTER — Encounter: Payer: Self-pay | Admitting: Critical Care Medicine

## 2021-08-20 ENCOUNTER — Ambulatory Visit: Payer: Medicare Other | Attending: Critical Care Medicine | Admitting: Critical Care Medicine

## 2021-08-20 VITALS — BP 165/97 | HR 66 | Ht 64.0 in | Wt 173.2 lb

## 2021-08-20 DIAGNOSIS — K269 Duodenal ulcer, unspecified as acute or chronic, without hemorrhage or perforation: Secondary | ICD-10-CM

## 2021-08-20 DIAGNOSIS — K315 Obstruction of duodenum: Secondary | ICD-10-CM

## 2021-08-20 DIAGNOSIS — I10 Essential (primary) hypertension: Secondary | ICD-10-CM | POA: Diagnosis not present

## 2021-08-20 DIAGNOSIS — M170 Bilateral primary osteoarthritis of knee: Secondary | ICD-10-CM

## 2021-08-20 DIAGNOSIS — G8929 Other chronic pain: Secondary | ICD-10-CM

## 2021-08-20 DIAGNOSIS — F332 Major depressive disorder, recurrent severe without psychotic features: Secondary | ICD-10-CM | POA: Diagnosis not present

## 2021-08-20 DIAGNOSIS — R109 Unspecified abdominal pain: Secondary | ICD-10-CM

## 2021-08-20 DIAGNOSIS — Z79891 Long term (current) use of opiate analgesic: Secondary | ICD-10-CM

## 2021-08-20 DIAGNOSIS — G629 Polyneuropathy, unspecified: Secondary | ICD-10-CM

## 2021-08-20 DIAGNOSIS — K279 Peptic ulcer, site unspecified, unspecified as acute or chronic, without hemorrhage or perforation: Secondary | ICD-10-CM | POA: Diagnosis not present

## 2021-08-20 MED ORDER — PROMETHAZINE HCL 12.5 MG PO TABS: 12.5000 mg | ORAL_TABLET | Freq: Three times a day (TID) | ORAL | 0 refills | Status: DC | PRN

## 2021-08-20 MED ORDER — AMLODIPINE BESYLATE 10 MG PO TABS: 10.0000 mg | ORAL_TABLET | Freq: Every day | ORAL | 3 refills | Status: DC

## 2021-08-20 MED ORDER — CITALOPRAM HYDROBROMIDE 40 MG PO TABS: 40.0000 mg | ORAL_TABLET | Freq: Every day | ORAL | 2 refills | Status: DC

## 2021-08-20 MED ORDER — SUCRALFATE 1 GM/10ML PO SUSP: 1.0000 g | Freq: Three times a day (TID) | ORAL | 2 refills | Status: DC

## 2021-08-20 MED ORDER — DONEPEZIL HCL 10 MG PO TABS: 10.0000 mg | ORAL_TABLET | Freq: Two times a day (BID) | ORAL | 0 refills | Status: DC

## 2021-08-20 MED ORDER — DULOXETINE HCL 40 MG PO CPEP: 1.0000 | ORAL_CAPSULE | Freq: Every day | ORAL | 4 refills | Status: DC

## 2021-08-20 MED ORDER — TRAMADOL HCL 50 MG PO TABS: 50.0000 mg | ORAL_TABLET | Freq: Three times a day (TID) | ORAL | 0 refills | Status: DC | PRN

## 2021-08-20 MED ORDER — LIDOCAINE 5 % EX PTCH: 1.0000 | MEDICATED_PATCH | CUTANEOUS | 4 refills | Status: DC

## 2021-08-20 MED ORDER — QUETIAPINE FUMARATE 50 MG PO TABS: 50.0000 mg | ORAL_TABLET | Freq: Every day | ORAL | 1 refills | Status: DC

## 2021-08-20 MED ORDER — TIZANIDINE HCL 4 MG PO TABS: 4.0000 mg | ORAL_TABLET | Freq: Three times a day (TID) | ORAL | 0 refills | Status: DC | PRN

## 2021-08-20 MED ORDER — TRAZODONE HCL 100 MG PO TABS: 100.0000 mg | ORAL_TABLET | Freq: Every day | ORAL | 1 refills | Status: DC

## 2021-08-20 MED ORDER — GABAPENTIN 300 MG PO CAPS: 300.0000 mg | ORAL_CAPSULE | Freq: Two times a day (BID) | ORAL | 6 refills | Status: DC

## 2021-08-20 MED ORDER — LANSOPRAZOLE 30 MG PO TBDD: DELAYED_RELEASE_TABLET | ORAL | 3 refills | Status: DC

## 2021-08-20 NOTE — Assessment & Plan Note (Signed)
Patient encouraged to follow-up with gastroenterology for her esophageal procedure

## 2021-08-20 NOTE — Assessment & Plan Note (Signed)
Tramadol was refilled PDMP checked drug screen has been performed contract in place

## 2021-08-20 NOTE — Assessment & Plan Note (Signed)
Will eventually need knee replacement until then have to wait on her other condition to improve

## 2021-08-20 NOTE — Addendum Note (Signed)
Addended by: Elsie Stain on: 08/20/2021 07:12 PM   Modules accepted: Orders

## 2021-08-20 NOTE — Assessment & Plan Note (Signed)
Severe depression with history of suicidal ideation  Licensed clinical social work referral  Increase citalopram to 40 mg daily  Continue duloxetine 60 mg daily  Referral to outpatient psychiatry made

## 2021-08-20 NOTE — Progress Notes (Signed)
Established Patient Office Visit  Subjective:  Patient ID: Maria Murphy, female    DOB: 03-05-1956  Age: 65 y.o. MRN: [id number]  CC:  Chief Complaint  Patient presents with   Hypertension    HPI Criselda Starke presents for primary care follow-up.  Patient is about to undergo urgent gastroenterology evaluation in the morning for her duodenal obstruction.  She still has episodes of nausea.  Patient's depression still is not well controlled she still has somewhat disorganized thoughts of suicide which is a chronic condition.  She is never taking action on this.  Patient declined a tetanus vaccine and was offered.  She had a total knee replacement on the left canceled because of her gastrointestinal issues.  On arrival blood pressure is elevated 165/97.  She states at home she has had lower numbers.  The patient is enrolling in [place] healthcare sponsored elderly program 2 days a week.  Patient has no other complaints.  Past Medical History:  Diagnosis Date   Acute saddle pulmonary embolism without acute cor pulmonale (HCC)    Basal cell carcinoma 01/16/2021   L nose, MOHs 04/14/21   Collagen vascular disease ([place])    Duodenal ulcer    Essential hypertension, benign 01/24/2015   Family history of ovarian cancer 01/06/2021   Family history of pancreatic cancer 01/06/2021   Family history of uterine cancer 01/06/2021   Foot ulcer (HCC)    GERD (gastroesophageal reflux disease)    Hypertension    Memory loss    Peptic ulcer with perforation ([place])    Perforated abdominal viscus 04/22/2020   PUD (peptic ulcer disease)    with perforation s/p exploratory laparotomy   Pulmonary embolus ([place]) 04/2020   Skin cancer    Suicidal ideations 08/07/2020   Vision abnormalities     Past Surgical History:  Procedure Laterality Date   BIOPSY  11/20/2020   Procedure: BIOPSY;  Surgeon: Irving Copas., MD;  Location: [facility];  Service: Gastroenterology;;   CENTRAL LINE INSERTION  Right 04/22/2020   Procedure: CENTRAL LINE INSERTION;  Surgeon: Virl Cagey, MD;  Location: [facility];  Service: General;  Laterality: Right;   DIAGNOSTIC LAPAROSCOPY     ESOPHAGOGASTRODUODENOSCOPY N/A 11/20/2020   Procedure: ESOPHAGOGASTRODUODENOSCOPY (EGD);  Surgeon: Irving Copas., MD;  Location: [facility];  Service: Gastroenterology;  Laterality: N/A;   ESOPHAGOGASTRODUODENOSCOPY (EGD) WITH PROPOFOL N/A 07/10/2020   Procedure: ESOPHAGOGASTRODUODENOSCOPY (EGD) WITH PROPOFOL;  Surgeon: Rogene Houston, MD;  Location: [facility];  Service: Endoscopy;  Laterality: N/A;   ESOPHAGOGASTRODUODENOSCOPY (EGD) WITH PROPOFOL N/A 10/18/2020   Procedure: ESOPHAGOGASTRODUODENOSCOPY (EGD) WITH PROPOFOL;  Surgeon: Harvel Quale, MD;  Location: [facility];  Service: Gastroenterology;  Laterality: N/A;   EUS N/A 11/20/2020   Procedure: UPPER ENDOSCOPIC ULTRASOUND (EUS) LINEAR;  Surgeon: Irving Copas., MD;  Location: [facility];  Service: Gastroenterology;  Laterality: N/A;   GASTRORRHAPHY  04/22/2020   Procedure: GASTRORRHAPHY;  Surgeon: Virl Cagey, MD;  Location: [facility];  Service: General;;   IR CATHETER TUBE CHANGE  05/09/2020   LAPAROTOMY N/A 04/22/2020   Procedure: EXPLORATORY LAPAROTOMY;  Surgeon: Virl Cagey, MD;  Location: [facility];  Service: General;  Laterality: N/A;   TONSILLECTOMY      Family History  Problem Relation Age of Onset   High Cholesterol Mother    Hypertension Mother    Arthritis/Rheumatoid Mother    Pancreatic cancer Mother 35   Alcohol abuse Father    Hypertension Maternal Grandmother  Uterine cancer Maternal Grandmother 74   Ovarian cancer Cousin        before age 72   Colon cancer Neg Hx    Colon polyps Neg Hx    Inflammatory bowel disease Neg Hx     Social History   Socioeconomic History   Marital status: Widowed    Spouse name: Not on file   Number of children: 2   Years of education: Not on file   Highest  education level: Not on file  Occupational History   Not on file  Tobacco Use   Smoking status: Never   Smokeless tobacco: Never  Vaping Use   Vaping Use: Never used  Substance and Sexual Activity   Alcohol use: Yes    Alcohol/week: 2.0 - 3.0 standard drinks    Types: 2 - 3 Glasses of wine per week   Drug use: No   Sexual activity: Not Currently    Birth control/protection: Post-menopausal  Other Topics Concern   Not on file  Social History Narrative   Not on file   Social Determinants of Health   Financial Resource Strain: Not on file  Food Insecurity: Not on file  Transportation Needs: No Transportation Needs   Lack of Transportation (Medical): No   Lack of Transportation (Non-Medical): No  Physical Activity: Inactive   Days of Exercise per Week: 0 days   Minutes of Exercise per Session: 0 min  Stress: Not on file  Social Connections: Not on file  Intimate Partner Violence: Not At Risk   Fear of Current or Ex-Partner: No   Emotionally Abused: No   Physically Abused: No   Sexually Abused: No    Outpatient Medications Prior to Visit  Medication Sig Dispense Refill   acetaminophen (TYLENOL) 500 MG tablet Take 1 tablet (500 mg total) by mouth every 6 (six) hours as needed for mild pain or fever.     Ascorbic Acid (VITAMIN C PO) Take 1 tablet by mouth daily.     ferrous sulfate 325 (65 FE) MG tablet Take 325 mg by mouth in the morning.     lactose free nutrition (BOOST) LIQD Take 237 mLs by mouth 2 (two) times daily. Boost Breeze Clear     loperamide (IMODIUM) 2 MG capsule Take 1 capsule (2 mg total) by mouth 2 (two) times daily as needed for diarrhea or loose stools. 14 capsule 0   amLODipine (NORVASC) 5 MG tablet Take 1 tablet (5 mg total) by mouth daily. For BP 90 tablet 2   citalopram (CELEXA) 20 MG tablet Take 1 tablet (20 mg total) by mouth at bedtime. 60 tablet 3   DULoxetine HCl 40 MG CPEP Take 1 capsule by mouth daily. 60 capsule 4   gabapentin (NEURONTIN) 300  MG capsule Take 1 capsule (300 mg total) by mouth 2 (two) times daily. 60 capsule 6   lansoprazole (PREVACID SOLUTAB) 30 MG disintegrating tablet Place 1 tablet on tongue twice daily and allow to dissolve until particles can be swallowed. Do not swallow whole, break, cut, or chew tablet. 60 tablet 3   lidocaine (LIDODERM) 5 % Place 1 patch onto the skin daily. Remove & Discard patch within 12 hours or as directed by MD 30 patch 0   ondansetron (ZOFRAN-ODT) 8 MG disintegrating tablet Take 1 tablet (8 mg total) by mouth every 8 (eight) hours as needed for nausea or vomiting. 20 tablet 0   promethazine (PHENERGAN) 12.5 MG tablet Take 1 tablet (12.5 mg total) by mouth  every 8 (eight) hours as needed for nausea or vomiting. 20 tablet 0   QUEtiapine (SEROQUEL) 50 MG tablet Take 1 tablet (50 mg total) by mouth at bedtime. 90 tablet 1   sucralfate (CARAFATE) 1 GM/10ML suspension Take 10 mLs (1 g total) by mouth 4 (four) times daily -  with meals and at bedtime. 420 mL 2   tiZANidine (ZANAFLEX) 4 MG tablet Take 1 tablet (4 mg total) by mouth every 8 (eight) hours as needed for muscle spasms. 30 tablet 0   traMADol (ULTRAM) 50 MG tablet Take 1 tablet (50 mg total) by mouth every 8 (eight) hours as needed. 60 tablet 0   traZODone (DESYREL) 100 MG tablet Take 1 tablet (100 mg total) by mouth at bedtime. 30 tablet 1   donepezil (ARICEPT) 10 MG tablet Take 0.5-1 tablets (5-10 mg total) by mouth in the morning and at bedtime. (Patient taking differently: Take 10 mg by mouth in the morning and at bedtime.) 180 tablet 0   No facility-administered medications prior to visit.    Allergies  Allergen Reactions   Wheat Extract Swelling   Ketamine Other (See Comments)    Agitation, confusion, hypertension, tachycardia    ROS Review of Systems  Constitutional: Negative.   HENT: Negative.  Negative for ear pain, postnasal drip, rhinorrhea, sinus pressure, sore throat, trouble swallowing and voice change.   Eyes:  Negative.   Respiratory: Negative.  Negative for apnea, cough, choking, chest tightness, shortness of breath, wheezing and stridor.   Cardiovascular: Negative.  Negative for chest pain, palpitations and leg swelling.  Gastrointestinal: Negative.  Negative for abdominal distention, abdominal pain, nausea and vomiting.  Genitourinary: Negative.   Musculoskeletal: Negative.  Negative for arthralgias and myalgias.  Skin: Negative.  Negative for rash.  Allergic/Immunologic: Negative.  Negative for environmental allergies and food allergies.  Neurological: Negative.  Negative for dizziness, syncope, weakness and headaches.  Hematological: Negative.  Negative for adenopathy. Does not bruise/bleed easily.  Psychiatric/Behavioral:  Positive for behavioral problems, dysphoric mood and suicidal ideas. Negative for agitation, self-injury and sleep disturbance. The patient is not nervous/anxious.      Objective:    Physical Exam Vitals reviewed.  Constitutional:      Appearance: Normal appearance. She is well-developed. She is not diaphoretic.  HENT:     Head: Normocephalic and atraumatic.     Nose: No nasal deformity, septal deviation, mucosal edema or rhinorrhea.     Right Sinus: No maxillary sinus tenderness or frontal sinus tenderness.     Left Sinus: No maxillary sinus tenderness or frontal sinus tenderness.     Mouth/Throat:     Pharynx: No oropharyngeal exudate.  Eyes:     General: No scleral icterus.    Conjunctiva/sclera: Conjunctivae normal.     Pupils: Pupils are equal, round, and reactive to light.  Neck:     Thyroid: No thyromegaly.     Vascular: No carotid bruit or JVD.     Trachea: Trachea normal. No tracheal tenderness or tracheal deviation.  Cardiovascular:     Rate and Rhythm: Normal rate and regular rhythm.     Chest Wall: PMI is not displaced.     Pulses: Normal pulses. No decreased pulses.     Heart sounds: Normal heart sounds, S1 normal and S2 normal. Heart sounds not  distant. No murmur heard. No systolic murmur is present.  No diastolic murmur is present.    No friction rub. No gallop. No S3 or S4 sounds.  Pulmonary:  Effort: No tachypnea, accessory muscle usage or respiratory distress.     Breath sounds: No stridor. No decreased breath sounds, wheezing, rhonchi or rales.  Chest:     Chest wall: No tenderness.  Abdominal:     General: Bowel sounds are normal. There is no distension.     Palpations: Abdomen is soft. Abdomen is not rigid.     Tenderness: There is no abdominal tenderness. There is no guarding or rebound.  Musculoskeletal:        General: Normal range of motion.     Cervical back: Normal range of motion and neck supple. No edema, erythema or rigidity. No muscular tenderness. Normal range of motion.  Lymphadenopathy:     Head:     Right side of head: No submental or submandibular adenopathy.     Left side of head: No submental or submandibular adenopathy.     Cervical: No cervical adenopathy.  Skin:    General: Skin is warm and dry.     Coloration: Skin is not pale.     Findings: No rash.     Nails: There is no clubbing.  Neurological:     Mental Status: She is alert and oriented to person, place, and time.     Sensory: No sensory deficit.  Psychiatric:        Attention and Perception: Attention and perception normal.        Mood and Affect: Mood is depressed. Affect is flat.        Speech: Speech normal.        Behavior: Behavior normal.        Thought Content: Thought content is not paranoid or delusional. Thought content includes suicidal ideation. Thought content does not include homicidal ideation. Thought content does not include homicidal or suicidal plan.        Cognition and Memory: Cognition and memory normal.        Judgment: Judgment normal.    BP (!) 165/97    Pulse 66    Ht 5\' 4"  (1.626 m)    Wt 173 lb 3.2 oz (78.6 kg)    SpO2 97%    BMI 29.73 kg/m  Wt Readings from Last 3 Encounters:  08/20/21 173 lb 3.2 oz  (78.6 kg)  06/19/21 162 lb 12.8 oz (73.8 kg)  05/23/21 164 lb 11.2 oz (74.7 kg)     There are no preventive care reminders to display for this patient.   There are no preventive care reminders to display for this patient.  Lab Results  Component Value Date   TSH 1.13 10/17/2020   Lab Results  Component Value Date   WBC 4.6 05/16/2021   HGB 12.0 05/16/2021   HCT 36.8 05/16/2021   MCV 88.5 05/16/2021   PLT 220 05/16/2021   Lab Results  Component Value Date   NA 136 05/13/2021   K 3.2 (L) 05/13/2021   CO2 24 05/13/2021   GLUCOSE 121 (H) 05/13/2021   BUN 11 05/13/2021   CREATININE 0.84 05/13/2021   BILITOT 0.8 05/13/2021   ALKPHOS 58 05/13/2021   AST 18 05/13/2021   ALT 8 05/13/2021   PROT 7.9 05/13/2021   ALBUMIN 4.2 05/13/2021   CALCIUM 9.6 05/13/2021   ANIONGAP 14 05/13/2021   No results found for: CHOL No results found for: HDL No results found for: LDLCALC No results found for: TRIG No results found for: CHOLHDL No results found for: HGBA1C    Assessment & Plan:   Problem List  Items Addressed This Visit       Cardiovascular and Mediastinum   Essential hypertension    Hypertension not well controlled at this time Plan to increase amlodipine to 10 mg daily       Relevant Medications   amLODipine (NORVASC) 10 MG tablet     Digestive   Duodenal stricture    Patient encouraged to follow-up with gastroenterology for her esophageal procedure        Nervous and Auditory   Peripheral polyneuropathy   Relevant Medications   tiZANidine (ZANAFLEX) 4 MG tablet   citalopram (CELEXA) 40 MG tablet   donepezil (ARICEPT) 10 MG tablet   DULoxetine HCl 40 MG CPEP   gabapentin (NEURONTIN) 300 MG capsule   QUEtiapine (SEROQUEL) 50 MG tablet   traZODone (DESYREL) 100 MG tablet     Musculoskeletal and Integument   Primary osteoarthritis of both knees    Will eventually need knee replacement until then have to wait on her other condition to improve       Relevant Medications   tiZANidine (ZANAFLEX) 4 MG tablet   traMADol (ULTRAM) 50 MG tablet     Other   Chronic abdominal pain   Relevant Medications   tiZANidine (ZANAFLEX) 4 MG tablet   citalopram (CELEXA) 40 MG tablet   DULoxetine HCl 40 MG CPEP   gabapentin (NEURONTIN) 300 MG capsule   traZODone (DESYREL) 100 MG tablet   traMADol (ULTRAM) 50 MG tablet   Opioid contract exists    Tramadol was refilled PDMP checked drug screen has been performed contract in place      Major depressive disorder, recurrent severe without psychotic features ([place])    As per depression assessment  Dosing of citalopram increased to licensed clinical social work and psychiatry made      Relevant Medications   citalopram (CELEXA) 40 MG tablet   DULoxetine HCl 40 MG CPEP   traZODone (DESYREL) 100 MG tablet   RESOLVED: Depression - Primary    Severe depression with history of suicidal ideation  Licensed clinical social work referral  Increase citalopram to 40 mg daily  Continue duloxetine 60 mg daily  Referral to outpatient psychiatry made      Relevant Medications   citalopram (CELEXA) 40 MG tablet   DULoxetine HCl 40 MG CPEP   QUEtiapine (SEROQUEL) 50 MG tablet   traZODone (DESYREL) 100 MG tablet   Other Visit Diagnoses     PUD (peptic ulcer disease)       Relevant Medications   sucralfate (CARAFATE) 1 GM/10ML suspension   Duodenal ulcer       Relevant Medications   lansoprazole (PREVACID SOLUTAB) 30 MG disintegrating tablet       Meds ordered this encounter  Medications   tiZANidine (ZANAFLEX) 4 MG tablet    Sig: Take 1 tablet (4 mg total) by mouth every 8 (eight) hours as needed for muscle spasms.    Dispense:  30 tablet    Refill:  0   sucralfate (CARAFATE) 1 GM/10ML suspension    Sig: Take 10 mLs (1 g total) by mouth 4 (four) times daily -  with meals and at bedtime.    Dispense:  420 mL    Refill:  2   amLODipine (NORVASC) 10 MG tablet    Sig: Take 1 tablet (10 mg  total) by mouth daily. For BP    Dispense:  90 tablet    Refill:  3   citalopram (CELEXA) 40 MG tablet  Sig: Take 1 tablet (40 mg total) by mouth at bedtime.    Dispense:  90 tablet    Refill:  2    Dose change   donepezil (ARICEPT) 10 MG tablet    Sig: Take 1 tablet (10 mg total) by mouth in the morning and at bedtime.    Dispense:  180 tablet    Refill:  0   DULoxetine HCl 40 MG CPEP    Sig: Take 1 capsule by mouth daily.    Dispense:  60 capsule    Refill:  4   gabapentin (NEURONTIN) 300 MG capsule    Sig: Take 1 capsule (300 mg total) by mouth 2 (two) times daily.    Dispense:  60 capsule    Refill:  6   lansoprazole (PREVACID SOLUTAB) 30 MG disintegrating tablet    Sig: Place 1 tablet on tongue twice daily and allow to dissolve until particles can be swallowed. Do not swallow whole, break, cut, or chew tablet.    Dispense:  60 tablet    Refill:  3   lidocaine (LIDODERM) 5 %    Sig: Place 1 patch onto the skin daily. Remove & Discard patch within 12 hours or as directed by MD    Dispense:  30 patch    Refill:  4   promethazine (PHENERGAN) 12.5 MG tablet    Sig: Take 1 tablet (12.5 mg total) by mouth every 8 (eight) hours as needed for nausea or vomiting.    Dispense:  20 tablet    Refill:  0   QUEtiapine (SEROQUEL) 50 MG tablet    Sig: Take 1 tablet (50 mg total) by mouth at bedtime.    Dispense:  90 tablet    Refill:  1   traZODone (DESYREL) 100 MG tablet    Sig: Take 1 tablet (100 mg total) by mouth at bedtime.    Dispense:  30 tablet    Refill:  1   traMADol (ULTRAM) 50 MG tablet    Sig: Take 1 tablet (50 mg total) by mouth every 8 (eight) hours as needed.    Dispense:  60 tablet    Refill:  0    Follow-up: Return in about 2 months (around 10/21/2021).    Asencion Noble, MD

## 2021-08-20 NOTE — Assessment & Plan Note (Addendum)
As per depression assessment  Dosing of citalopram increased to licensed clinical social work and psychiatry made

## 2021-08-20 NOTE — Assessment & Plan Note (Signed)
Hypertension not well controlled at this time Plan to increase amlodipine to 10 mg daily

## 2021-08-20 NOTE — Patient Instructions (Signed)
Refills on all your medications sent to [facility]  Amlodipine is increased to 10 mg daily you can take 2 of the 5 mg tablets daily but your refill will be a single 10 mg tablet for your blood pressure  Citalopram is increased to 40 mg daily you can take 2 of the 20 mg tablets but your refill will be a single 40 mg tablet for depression  Referral to psychiatry will be made  If your suicidal ideation intensifies you can call the toll-free 988 number for suicide help  Our clinical social worker will be calling you to check in with you and we will will get you in with psychiatry  Keep your appointment with gastroenterology  Return to see Dr. Joya Gaskins 2 months

## 2021-08-21 ENCOUNTER — Ambulatory Visit (HOSPITAL_COMMUNITY): Payer: Medicare Other | Admitting: Anesthesiology

## 2021-08-21 ENCOUNTER — Ambulatory Visit (HOSPITAL_COMMUNITY): Payer: Medicare Other

## 2021-08-21 ENCOUNTER — Encounter (HOSPITAL_COMMUNITY)
Admission: RE | Disposition: A | Discharge status: Home / Self Care | Payer: Self-pay | Source: Home / Self Care | Attending: Gastroenterology

## 2021-08-21 ENCOUNTER — Ambulatory Visit (HOSPITAL_COMMUNITY)
Admission: RE | Admit: 2021-08-21 | Discharge: 2021-08-21 | Disposition: A | Discharge status: Home / Self Care | Payer: Medicare Other | Attending: Gastroenterology | Admitting: Gastroenterology

## 2021-08-21 ENCOUNTER — Other Ambulatory Visit (HOSPITAL_COMMUNITY): Payer: Self-pay | Admitting: Gastroenterology

## 2021-08-21 ENCOUNTER — Encounter (HOSPITAL_COMMUNITY): Payer: Self-pay | Admitting: Gastroenterology

## 2021-08-21 DIAGNOSIS — K2289 Other specified disease of esophagus: Secondary | ICD-10-CM | POA: Insufficient documentation

## 2021-08-21 DIAGNOSIS — R933 Abnormal findings on diagnostic imaging of other parts of digestive tract: Secondary | ICD-10-CM | POA: Diagnosis not present

## 2021-08-21 DIAGNOSIS — K315 Obstruction of duodenum: Secondary | ICD-10-CM

## 2021-08-21 DIAGNOSIS — K862 Cyst of pancreas: Secondary | ICD-10-CM | POA: Diagnosis present

## 2021-08-21 DIAGNOSIS — K269 Duodenal ulcer, unspecified as acute or chronic, without hemorrhage or perforation: Secondary | ICD-10-CM | POA: Diagnosis not present

## 2021-08-21 DIAGNOSIS — K3189 Other diseases of stomach and duodenum: Secondary | ICD-10-CM | POA: Insufficient documentation

## 2021-08-21 DIAGNOSIS — K8010 Calculus of gallbladder with chronic cholecystitis without obstruction: Secondary | ICD-10-CM | POA: Insufficient documentation

## 2021-08-21 DIAGNOSIS — K8020 Calculus of gallbladder without cholecystitis without obstruction: Secondary | ICD-10-CM | POA: Diagnosis not present

## 2021-08-21 DIAGNOSIS — K2970 Gastritis, unspecified, without bleeding: Secondary | ICD-10-CM

## 2021-08-21 DIAGNOSIS — K449 Diaphragmatic hernia without obstruction or gangrene: Secondary | ICD-10-CM | POA: Diagnosis not present

## 2021-08-21 DIAGNOSIS — Z419 Encounter for procedure for purposes other than remedying health state, unspecified: Secondary | ICD-10-CM

## 2021-08-21 HISTORY — PX: ESOPHAGOGASTRODUODENOSCOPY (EGD) WITH PROPOFOL: SHX5813

## 2021-08-21 HISTORY — PX: EUS: SHX5427

## 2021-08-21 HISTORY — PX: FINE NEEDLE ASPIRATION: SHX5430

## 2021-08-21 HISTORY — PX: BIOPSY: SHX5522

## 2021-08-21 HISTORY — PX: BILIARY DILATION: SHX6850

## 2021-08-21 SURGERY — UPPER ESOPHAGEAL ENDOSCOPIC ULTRASOUND (EUS)
Anesthesia: Monitor Anesthesia Care

## 2021-08-21 SURGERY — ESOPHAGOGASTRODUODENOSCOPY (EGD) WITH PROPOFOL
Anesthesia: Monitor Anesthesia Care

## 2021-08-21 MED ORDER — CIPROFLOXACIN HCL 500 MG PO TABS: 500.0000 mg | ORAL_TABLET | Freq: Two times a day (BID) | ORAL | 0 refills | Status: AC

## 2021-08-21 MED ORDER — CIPROFLOXACIN IN D5W 400 MG/200ML IV SOLN
400.0000 mg | Freq: Two times a day (BID) | INTRAVENOUS | Status: DC
  Administered 2021-08-21: 11:00:00 400 mg via INTRAVENOUS

## 2021-08-21 MED ORDER — SODIUM CHLORIDE 0.9 % IV SOLN: INTRAVENOUS | Status: DC

## 2021-08-21 MED ORDER — LIDOCAINE HCL (CARDIAC) PF 100 MG/5ML IV SOSY
PREFILLED_SYRINGE | INTRAVENOUS | Status: DC | PRN
  Administered 2021-08-21: 100 mg via INTRAVENOUS

## 2021-08-21 MED ORDER — PROPOFOL 10 MG/ML IV BOLUS
INTRAVENOUS | Status: DC | PRN
  Administered 2021-08-21 (×2): 40 mg via INTRAVENOUS
  Administered 2021-08-21: 20 mg via INTRAVENOUS
  Administered 2021-08-21: 40 mg via INTRAVENOUS

## 2021-08-21 MED ORDER — PROPOFOL 500 MG/50ML IV EMUL
INTRAVENOUS | Status: DC | PRN
  Administered 2021-08-21: 125 ug/kg/min via INTRAVENOUS

## 2021-08-21 MED ORDER — LACTATED RINGERS IV SOLN: INTRAVENOUS | Status: DC

## 2021-08-21 MED ORDER — CIPROFLOXACIN IN D5W 400 MG/200ML IV SOLN
INTRAVENOUS | Status: AC
  Filled 2021-08-21: qty 200

## 2021-08-21 MED ORDER — DEXMEDETOMIDINE (PRECEDEX) IN NS 20 MCG/5ML (4 MCG/ML) IV SYRINGE
PREFILLED_SYRINGE | INTRAVENOUS | Status: DC | PRN
  Administered 2021-08-21: 8 ug via INTRAVENOUS

## 2021-08-21 SURGICAL SUPPLY — 15 items

## 2021-08-21 NOTE — H&P (Addendum)
GASTROENTEROLOGY PROCEDURE H&P NOTE   Primary Care Physician: Elsie Stain, MD  HPI: Maria Murphy is a 65 y.o. female who presents for EGD/EUS to follow up prior Duodenal ulcer with subsequent duodenal stenosis and evaluation of pancreatic cyst and possible aspiration.  Past Medical History:  Diagnosis Date   Acute saddle pulmonary embolism without acute cor pulmonale (HCC)    Basal cell carcinoma 01/16/2021   L nose, MOHs 04/14/21   Collagen vascular disease ([place])    Duodenal ulcer    Essential hypertension, benign 01/24/2015   Family history of ovarian cancer 01/06/2021   Family history of pancreatic cancer 01/06/2021   Family history of uterine cancer 01/06/2021   Foot ulcer (HCC)    GERD (gastroesophageal reflux disease)    Hypertension    Memory loss    Peptic ulcer with perforation ([place])    Perforated abdominal viscus 04/22/2020   PUD (peptic ulcer disease)    with perforation s/p exploratory laparotomy   Pulmonary embolus ([place]) 04/2020   Skin cancer    Suicidal ideations 08/07/2020   Vision abnormalities    Past Surgical History:  Procedure Laterality Date   BIOPSY  11/20/2020   Procedure: BIOPSY;  Surgeon: Irving Copas., MD;  Location: [facility];  Service: Gastroenterology;;   CENTRAL LINE INSERTION Right 04/22/2020   Procedure: CENTRAL LINE INSERTION;  Surgeon: Virl Cagey, MD;  Location: [facility];  Service: General;  Laterality: Right;   DIAGNOSTIC LAPAROSCOPY     ESOPHAGOGASTRODUODENOSCOPY N/A 11/20/2020   Procedure: ESOPHAGOGASTRODUODENOSCOPY (EGD);  Surgeon: Irving Copas., MD;  Location: [facility];  Service: Gastroenterology;  Laterality: N/A;   ESOPHAGOGASTRODUODENOSCOPY (EGD) WITH PROPOFOL N/A 07/10/2020   Procedure: ESOPHAGOGASTRODUODENOSCOPY (EGD) WITH PROPOFOL;  Surgeon: Rogene Houston, MD;  Location: [facility];  Service: Endoscopy;  Laterality: N/A;   ESOPHAGOGASTRODUODENOSCOPY (EGD) WITH PROPOFOL N/A 10/18/2020    Procedure: ESOPHAGOGASTRODUODENOSCOPY (EGD) WITH PROPOFOL;  Surgeon: Harvel Quale, MD;  Location: [facility];  Service: Gastroenterology;  Laterality: N/A;   EUS N/A 11/20/2020   Procedure: UPPER ENDOSCOPIC ULTRASOUND (EUS) LINEAR;  Surgeon: Irving Copas., MD;  Location: [facility];  Service: Gastroenterology;  Laterality: N/A;   GASTRORRHAPHY  04/22/2020   Procedure: GASTRORRHAPHY;  Surgeon: Virl Cagey, MD;  Location: [facility];  Service: General;;   IR CATHETER TUBE CHANGE  05/09/2020   LAPAROTOMY N/A 04/22/2020   Procedure: EXPLORATORY LAPAROTOMY;  Surgeon: Virl Cagey, MD;  Location: [facility];  Service: General;  Laterality: N/A;   TONSILLECTOMY     No current facility-administered medications for this encounter.   No current facility-administered medications for this encounter. Allergies  Allergen Reactions   Wheat Extract Swelling   Ketamine Other (See Comments)    Agitation, confusion, hypertension, tachycardia   Family History  Problem Relation Age of Onset   High Cholesterol Mother    Hypertension Mother    Arthritis/Rheumatoid Mother    Pancreatic cancer Mother 76   Alcohol abuse Father    Hypertension Maternal Grandmother    Uterine cancer Maternal Grandmother 68   Ovarian cancer Cousin        before age 66   Colon cancer Neg Hx    Colon polyps Neg Hx    Inflammatory bowel disease Neg Hx    Social History   Socioeconomic History   Marital status: Widowed    Spouse name: Not on file   Number of children: 2   Years of education: Not on file  Highest education level: Not on file  Occupational History   Not on file  Tobacco Use   Smoking status: Never   Smokeless tobacco: Never  Vaping Use   Vaping Use: Never used  Substance and Sexual Activity   Alcohol use: Yes    Alcohol/week: 2.0 - 3.0 standard drinks    Types: 2 - 3 Glasses of wine per week   Drug use: No   Sexual activity: Not Currently    Birth  control/protection: Post-menopausal  Other Topics Concern   Not on file  Social History Narrative   Not on file   Social Determinants of Health   Financial Resource Strain: Not on file  Food Insecurity: Not on file  Transportation Needs: No Transportation Needs   Lack of Transportation (Medical): No   Lack of Transportation (Non-Medical): No  Physical Activity: Inactive   Days of Exercise per Week: 0 days   Minutes of Exercise per Session: 0 min  Stress: Not on file  Social Connections: Not on file  Intimate Partner Violence: Not At Risk   Fear of Current or Ex-Partner: No   Emotionally Abused: No   Physically Abused: No   Sexually Abused: No    Physical Exam: There were no vitals filed for this visit. There is no height or weight on file to calculate BMI. GEN: NAD EYE: Sclerae anicteric ENT: MMM CV: Non-tachycardic GI: Soft, protuberant abdomen, TTP in midabdomen noted (8-9/10 pain currently) NEURO:  Alert & Oriented x 3  Lab Results: No results for input(s): WBC, HGB, HCT, PLT in the last 72 hours. BMET No results for input(s): NA, K, CL, CO2, GLUCOSE, BUN, CREATININE, CALCIUM in the last 72 hours. LFT No results for input(s): PROT, ALBUMIN, AST, ALT, ALKPHOS, BILITOT, BILIDIR, IBILI in the last 72 hours. PT/INR No results for input(s): LABPROT, INR in the last 72 hours.   Impression / Plan: This is a 65 y.o.female who presents for EGD/EUS to follow up prior Duodenal ulcer with subsequent duodenal stenosis and evaluation of pancreatic cyst and possible aspiration.  The risks of an EUS including intestinal perforation, bleeding, infection, aspiration, and medication effects were discussed as was the possibility it may not give a definitive diagnosis if a biopsy is performed.  When a biopsy of the pancreas is done as part of the EUS, there is an additional risk of pancreatitis at the rate of about 1-2%.  It was explained that procedure related pancreatitis is typically  mild, although it can be severe and even life threatening, which is why we do not perform random pancreatic biopsies and only biopsy a lesion/area we feel is concerning enough to warrant the risk.  The risks and benefits of endoscopic evaluation/treatment were discussed with the patient and/or family; these include but are not limited to the risk of perforation, infection, bleeding, missed lesions, lack of diagnosis, severe illness requiring hospitalization, as well as anesthesia and sedation related illnesses.  The patient's history has been reviewed, patient examined, no change in status, and deemed stable for procedure.  The patient and/or family is agreeable to proceed.    Justice Britain, MD [facility] Office # [phone]

## 2021-08-21 NOTE — Transfer of Care (Signed)
Immediate Anesthesia Transfer of Care Note  Patient: Maria Murphy  Procedure(s) Performed: ESOPHAGOGASTRODUODENOSCOPY (EGD) WITH PROPOFOL BIOPSY FINE NEEDLE ASPIRATION (FNA) LINEAR UPPER ENDOSCOPIC ULTRASOUND (EUS) LINEAR BILIARY DILATION  Patient Location: PACU  Anesthesia Type:MAC  Level of Consciousness: awake and alert   Airway & Oxygen Therapy: Patient Spontanous Breathing and Patient connected to face mask oxygen  Post-op Assessment: Report given to RN and Post -op Vital signs reviewed and stable  Post vital signs: Reviewed and stable  Last Vitals:  Vitals Value Taken Time  BP    Temp    Pulse    Resp    SpO2      Last Pain:  Vitals:   08/21/21 0935  TempSrc: Oral  PainSc: 8          Complications: No notable events documented.

## 2021-08-21 NOTE — Op Note (Signed)
[facility] Patient Name: Maria Murphy Procedure Date : 08/21/2021 MRN: [id number] Attending MD: Justice Britain , MD Date of Birth: 21-Feb-1956 CSN: [id number] Age: 65 Admit Type: Outpatient Procedure:                Upper EUS Indications:              Pancreatic cyst on CT scan, Epigastric abdominal                            pain, Stenosis of the duodenum, For therapy of                            duodenal stenosis, Chronic duodenal ulcer with                            obstruction, Follow-up of chronic duodenal ulcer                            with obstruction, Abnormal UGI series Providers:                Justice Britain, MD, Grace Isaac, RN, Cletis Athens, Technician Referring MD:             Norvel Richards, MD, PA Jodi Mourning Asencion Noble, MD Medicines:                Monitored Anesthesia Care, Cipro 595 mg IV Complications:            No immediate complications. Estimated Blood Loss:     Estimated blood loss was minimal. Procedure:                Pre-Anesthesia Assessment:                           - Prior to the procedure, a History and Physical                            was performed, and patient medications and                            allergies were reviewed. The patient's tolerance of                            previous anesthesia was also reviewed. The risks                            and benefits of the procedure and the sedation                            options and risks were discussed with the patient.  All questions were answered, and informed consent                            was obtained. Prior Anticoagulants: The patient has                            taken no previous anticoagulant or antiplatelet                            agents. ASA Grade Assessment: III - A patient with                            severe systemic disease. After reviewing the risks                             and benefits, the patient was deemed in                            satisfactory condition to undergo the procedure.                           After obtaining informed consent, the endoscope was                            passed under direct vision. Throughout the                            procedure, the patient's blood pressure, pulse, and                            oxygen saturations were monitored continuously. The                            [id number]-H190 ([id number]) Olympus gastroscope was                            introduced through the mouth, and advanced to the                            second part of duodenum. The [id number] ([id number])                            Olympus linear ultrasound scope was introduced                            through the mouth, and advanced to the duodenum for                            ultrasound examination from the stomach and                            duodenum. The upper EUS was somewhat difficult due  to stenosis. Successful completion of the procedure                            was aided by performing the maneuvers documented                            (below) in this report. The patient tolerated the                            procedure. Scope In: Scope Out: Findings:      ENDOSCOPIC FINDING: :      No gross lesions were noted in the proximal esophagus and in the mid       esophagus.      One tongue of salmon-colored mucosa was present from 34 to 36 cm. No       other visible abnormalities were present. Biopsies were taken with a       cold forceps for histology.      The Z-line was irregular and was found 36 cm from the incisors.      A 3 cm hiatal hernia was present.      Localized moderately friable mucosa with no bleeding was found at the       gastroesophageal junction extending into the cardia within the [employer] [facility] [employer]       Biopsies were taken with a cold forceps for histology.      Patchy mild  inflammation characterized by erosions, erythema and       friability was found in the entire examined stomach. Biopsies were taken       with a cold forceps for histology and Helicobacter pylori testing.      A large healed ulcer was found in the duodenal bulb. Adjacent mucosal       findings include congestion, erosions, inflammation and ulcerations.      An acquired benign-appearing, intrinsic moderate stenosis was found in       the first portion of the duodenum and in the second portion of the       duodenum and was traversed. A TTS dilator was passed through the scope.       Dilation with a 06-04-11 mm and a 12-13.5-15 mm pyloric balloon dilator       was performed. The dilation site was examined and showed moderate       mucosal wrent/disruption, moderate improvement in luminal narrowing and       no perforation.      No gross lesions were noted in the second portion of the duodenum.      ENDOSONOGRAPHIC FINDING: :      A multicystic, hypoechoic and shadowing lesion was identified in the       region next to the pancreatic head/genu of the pancreas. It is not clear       to me that this is absolutely coming from the pancreas however as it is       not in obvious communication with the pancreatic duct. The lesion       measured at least 34 mm by 28 mm in maximal cross-sectional diameter.       There were many compartments thinly septated. The outer wall of the       lesion was thin. There was no associated mass. There was no internal       debris  within the fluid-filled cavity. Diagnostic needle aspiration for       fluid was performed of the largest compartment to decompression of that       area. There were multiple blood vessels interposed along the cystic       region. Color Doppler imaging was utilized prior to needle puncture to       confirm a lack of significant vascular structures within the needle       path. One pass was made with the Expect 22 gauge needle using a        transgastric approach. A stylet was used. The amount of fluid collected       was 40 mL. The fluid was clear, bilious and thin. Sample(s) were sent       for amylase concentration, bilirubin, lipase, cytology, CEA and GNAS       mutation analysis (if CEA >192).      There was no sign of significant endosonographic abnormality in the       pancreatic head (PD - 1.3 mm), genu of the pancreas (PD - 1.4 mm),       pancreatic body (PD - 1.6 mm) and pancreatic tai (PD - 1.1 mm)l. No       masses, the pancreatic duct was regular in contour.      Multiple stones were visualized endosonographically in the gallbladder.       The stones were round. They were hyperechoic and characterized by       shadowing.      There was no sign of significant endosonographic abnormality in the       common bile duct and in the common hepatic duct. No stones and no       biliary sludge were identified.      Endosonographic imaging in the visualized portion of the liver showed no       mass.      No malignant-appearing lymph nodes were visualized in the celiac region       (level 20), peripancreatic region and porta hepatis region.      The celiac region was visualized. Impression:               EGD Impression:                           - No gross lesions in esophagus proximally.                            Salmon-colored mucosa suspicious for Barrett's                            esophagus distally - biopsied.                           - Z-line irregular, 36 cm from the incisors.                           - 3 cm hiatal hernia.                           - Friable gastric mucosa at the GE Jxn/cardia  within [facility] - biopsied.                           - Gastritis - biopsied.                           - Duodenal scar with healing ulcer significantly                            improved from earlier this year but still active                            inflammation noted..                            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                            This was able to be traversed with the adult                            endoscope initially and then dilated to 15 mm.                           - No gross lesions in the second portion of the                            duodenum.                           EUS Impression:                           - A cystic lesion was seen in the pancreatic head                            and genu of the pancreas. Tissue was obtained from                            this exam, and results are pending. However, the                            endosonographic appearance is suggestive of benign                            inflammatory changes. Fine needle aspiration for                            fluid performed.                           - There was no sign of significant pathology                            parenchyma or ducts within the pancreatic head,  genu of the pancreas, pancreatic body and                            pancreatic tail.                           - Multiple stones were visualized                            endosonographically in the gallbladder.                           - There was no sign of significant pathology in the                            common bile duct and in the common hepatic duct.                           - No malignant-appearing lymph nodes were                            visualized in the celiac region (level 20),                            peripancreatic region and porta hepatis region. Recommendation:           - The patient will be observed post-procedure,                            until all discharge criteria are met.                           - Discharge patient to home.                           - Patient has a contact number available for                            emergencies. The signs and symptoms of potential                            delayed complications  were discussed with the                            patient. Return to normal activities tomorrow.                            Written discharge instructions were provided to the                            patient.                           - Low fat diet.                           -  Monitor for signs/symptoms of bleeding,                            perforation, and infection. If issues please call                            our number to get further assistance as needed.                           - Please use Cepacol or Halls Lozenges +/-                            Chloraseptic spray for next 72-96 hours to aid in                            sore thoat should you experience this.                           - Observe patient's clinical course.                           - Ciprofloxacin 500 mg twice daily x 5-days to                            decrease risk of post-interventional bleeding.                           - Await cytology results and await path results.                            Interesting that this was bilious in appearance                            rather than typical cystic areas. Query if this                            patient could have had a retained biloma after a                            cholecystitis or some sort of issue post her                            problems from Ulcer disease previously. Cytology                            and lab analysis will be helpful.                           - Recommend a CT-AP to evaluate and see how things                            look in 80-month (we had wanted one before this  procedure, but she did not have this done and last                            imaging was 49-months ago).                           - Continue twice daily PPI.                           - Continue Carafate QAC + QHS.                           - Return to GI clinic.                           - Recommend primary GI team have patient in  for                            follow up to be able to pursue further dilations                            based on symptoms. No fluoroscopy was required                            (though we had it available) today and we were able                            to dilate this area up to 15 mm.                           - The findings and recommendations were discussed                            with the patient.                           - The findings and recommendations were discussed                            with the patient's family. Procedure Code(s):        --- Professional ---                           [phone], Esophagogastroduodenoscopy, flexible,                            transoral; with transendoscopic ultrasound-guided                            intramural or transmural fine needle                            aspiration/biopsy(s), (includes endoscopic  ultrasound examination limited to the esophagus,                            stomach or duodenum, and adjacent structures)                           [id number], Esophagogastroduodenoscopy, flexible,                            transoral; with dilation of gastric/duodenal                            stricture(s) (eg, balloon, bougie) Diagnosis Code(s):        --- Professional ---                           K22.8, Other specified diseases of esophagus                           K44.9, Diaphragmatic hernia without obstruction or                            gangrene                           K31.89, Other diseases of stomach and duodenum                           K29.70, Gastritis, unspecified, without bleeding                           K31.5, Obstruction of duodenum                           K86.2, Cyst of pancreas                           K80.20, Calculus of gallbladder without                            cholecystitis without obstruction                           I89.9, Noninfective disorder of lymphatic vessels                             and lymph nodes, unspecified                           R10.13, Epigastric pain                           K26.7, Chronic duodenal ulcer without hemorrhage or                            perforation  R93.3, Abnormal findings on diagnostic imaging of                            other parts of digestive tract CPT copyright 2019 [employer]. All rights reserved. The codes documented in this report are preliminary and upon coder review may  be revised to meet current compliance requirements. Justice Britain, MD 08/21/2021 11:57:52 AM Number of Addenda: 0

## 2021-08-21 NOTE — Anesthesia Preprocedure Evaluation (Addendum)
Anesthesia Evaluation  Patient identified by MRN, date of birth, ID band Patient awake    Reviewed: Allergy & Precautions, NPO status , Patient's Chart, lab work & pertinent test results  History of Anesthesia Complications Negative for: history of anesthetic complications  Airway Mallampati: II  TM Distance: >3 FB Neck ROM: Full    Dental  (+) Dental Advisory Given   Pulmonary PE (2021)   breath sounds clear to auscultation       Cardiovascular hypertension, Pt. on medications (-) angina Rhythm:Regular Rate:Normal  '21 ECHO: EF 60 to 65%. The LV has normal function, no regional wall motion abnormalities. Left ventricular diastolic parameters were normal, no significant valvular abnormalities, no significant valvular abnormalities   Neuro/Psych Depression    GI/Hepatic Neg liver ROS, GERD  Medicated and Controlled,  Endo/Other  negative endocrine ROS  Renal/GU negative Renal ROS     Musculoskeletal  (+) Arthritis ,   Abdominal   Peds  Hematology  (+) anemia ,   Anesthesia Other Findings   Reproductive/Obstetrics                            Anesthesia Physical Anesthesia Plan  ASA: 3  Anesthesia Plan: MAC   Post-op Pain Management: Minimal or no pain anticipated   Induction:   PONV Risk Score and Plan: 2 and Propofol infusion, TIVA and Treatment may vary due to age or medical condition  Airway Management Planned: Natural Airway and Mask  Additional Equipment: None  Intra-op Plan:   Post-operative Plan:   Informed Consent: I have reviewed the patients History and Physical, chart, labs and discussed the procedure including the risks, benefits and alternatives for the proposed anesthesia with the patient or authorized representative who has indicated his/her understanding and acceptance.     Dental advisory given  Plan Discussed with: CRNA and Surgeon  Anesthesia Plan  Comments:        Anesthesia Quick Evaluation

## 2021-08-21 NOTE — Anesthesia Postprocedure Evaluation (Signed)
Anesthesia Post Note  Patient: Zandra Abts  Procedure(s) Performed: ESOPHAGOGASTRODUODENOSCOPY (EGD) WITH PROPOFOL BIOPSY FINE NEEDLE ASPIRATION (FNA) LINEAR UPPER ENDOSCOPIC ULTRASOUND (EUS) LINEAR BILIARY DILATION     Patient location during evaluation: PACU Anesthesia Type: MAC Level of consciousness: awake and alert, patient cooperative and oriented Pain management: pain level controlled Vital Signs Assessment: post-procedure vital signs reviewed and stable Respiratory status: spontaneous breathing, nonlabored ventilation and respiratory function stable Cardiovascular status: stable and blood pressure returned to baseline Postop Assessment: no apparent nausea or vomiting Anesthetic complications: no   No notable events documented.  Last Vitals:  Vitals:   08/21/21 1135 08/21/21 1150  BP: 119/77 (!) 124/54  Pulse: 62 (!) 56  Resp: 16 18  Temp:  36.6 C  SpO2: 100% 100%    Last Pain:  Vitals:   08/21/21 1150  TempSrc:   PainSc: 0-No pain                 Bradyn Vassey,E. Layliana Devins

## 2021-08-22 LAB — AMYLASE, BODY FLUID (OTHER): Amylase, Body Fluid: 126 U/L

## 2021-08-24 ENCOUNTER — Encounter (HOSPITAL_COMMUNITY): Payer: Self-pay | Admitting: Gastroenterology

## 2021-08-24 ENCOUNTER — Encounter: Payer: Self-pay | Admitting: Hematology and Oncology

## 2021-08-26 ENCOUNTER — Encounter: Payer: Self-pay | Admitting: Gastroenterology

## 2021-08-26 LAB — SURGICAL PATHOLOGY

## 2021-08-27 ENCOUNTER — Other Ambulatory Visit: Payer: Self-pay

## 2021-08-27 ENCOUNTER — Ambulatory Visit (INDEPENDENT_AMBULATORY_CARE_PROVIDER_SITE_OTHER): Payer: Commercial Managed Care - HMO | Admitting: Dermatology

## 2021-08-27 DIAGNOSIS — L821 Other seborrheic keratosis: Secondary | ICD-10-CM

## 2021-08-27 DIAGNOSIS — K862 Cyst of pancreas: Secondary | ICD-10-CM

## 2021-08-27 DIAGNOSIS — L820 Inflamed seborrheic keratosis: Secondary | ICD-10-CM

## 2021-08-27 DIAGNOSIS — L578 Other skin changes due to chronic exposure to nonionizing radiation: Secondary | ICD-10-CM

## 2021-08-27 DIAGNOSIS — Z85828 Personal history of other malignant neoplasm of skin: Secondary | ICD-10-CM

## 2021-08-27 NOTE — Patient Instructions (Signed)

## 2021-08-27 NOTE — Progress Notes (Signed)
° °  Follow-Up Visit   Subjective  Maria Murphy is a 66 y.o. female who presents for the following: irritated skin lesions (On the R cheek, L sideburn area, and L lower eyelid margin - patient would like areas treated today.).  The following portions of the chart were reviewed this encounter and updated as appropriate:   Tobacco   Allergies   Meds   Problems   Med Hx   Surg Hx   Fam Hx      Review of Systems:  No other skin or systemic complaints except as noted in HPI or Assessment and Plan.  Objective  Well appearing patient in no apparent distress; mood and affect are within normal limits.  A focused examination was performed including the face. Relevant physical exam findings are noted in the Assessment and Plan.  L lower eyelid margin x 1 Erythematous stuck-on, waxy papule or plaque  R cheek x 1, L sideburn x 1 (2) Erythematous stuck-on, waxy papule or plaque   Assessment & Plan  Inflamed seborrheic keratosis L lower eyelid margin x 1  Destruction of lesion - L lower eyelid margin x 1 Complexity: simple   Destruction method: cryotherapy   Informed consent: discussed and consent obtained   Timeout:  patient name, date of birth, surgical site, and procedure verified Lesion destroyed using liquid nitrogen: Yes   Region frozen until ice ball extended beyond lesion: Yes   Outcome: patient tolerated procedure well with no complications   Post-procedure details: wound care instructions given    Seborrheic keratosis, inflamed (2) R cheek x 1, L sideburn x 1  Destruction of lesion - R cheek x 1, L sideburn x 1 Complexity: simple   Destruction method: cryotherapy   Informed consent: discussed and consent obtained   Timeout:  patient name, date of birth, surgical site, and procedure verified Lesion destroyed using liquid nitrogen: Yes   Region frozen until ice ball extended beyond lesion: Yes   Outcome: patient tolerated procedure well with no complications   Post-procedure  details: wound care instructions given    Seborrheic Keratoses - Stuck-on, waxy, tan-brown papules and/or plaques  - Benign-appearing - Discussed benign etiology and prognosis. - Observe - Call for any changes  History of Basal Cell Carcinoma of the Skin - No evidence of recurrence today - Recommend regular full body skin exams - Recommend daily broad spectrum sunscreen SPF 30+ to sun-exposed areas, reapply every 2 hours as needed.  - Call if any new or changing lesions are noted between office visits  Actinic Damage - chronic, secondary to cumulative UV radiation exposure/sun exposure over time - diffuse scaly erythematous macules with underlying dyspigmentation - Recommend daily broad spectrum sunscreen SPF 30+ to sun-exposed areas, reapply every 2 hours as needed.  - Recommend staying in the shade or wearing long sleeves, sun glasses (UVA+UVB protection) and wide brim hats (4-inch brim around the entire circumference of the hat). - Call for new or changing lesions.  Return in about 3 months (around 11/25/2021) for ISK and BCC f/u .  Luther Redo, CMA, am acting as scribe for Sarina Ser, MD . Documentation: I have reviewed the above documentation for accuracy and completeness, and I agree with the above.  Sarina Ser, MD

## 2021-08-28 ENCOUNTER — Encounter: Payer: Self-pay | Admitting: Dermatology

## 2021-08-28 LAB — TOTAL BILIRUBIN, BODY FLUID

## 2021-08-29 ENCOUNTER — Encounter: Payer: Self-pay | Admitting: Gastroenterology

## 2021-08-29 NOTE — Progress Notes (Signed)
[facility] results from recent cyst aspiration  CEA 5.7 Amylase 147  This makes a mucinous cystic neoplasm seem much less likely.  Bilirubin level has not returned as of yet.  Hopefully this will give [place] some better understanding of what is going on as well as the cross-sectional imaging that will be repeated.  No changes in plan of action.  Justice Britain, MD [facility] Office # [phone]

## 2021-09-01 ENCOUNTER — Ambulatory Visit: Payer: Medicaid Other | Admitting: Gastroenterology

## 2021-09-01 LAB — LIPASE, FLUID: Lipase-Fluid: 172 U/L

## 2021-09-06 ENCOUNTER — Emergency Department (HOSPITAL_COMMUNITY)
Admission: EM | Admit: 2021-09-06 | Discharge: 2021-09-07 | Disposition: A | Discharge status: Home / Self Care | Payer: Medicare Other | Attending: Emergency Medicine | Admitting: Emergency Medicine

## 2021-09-06 ENCOUNTER — Emergency Department (HOSPITAL_COMMUNITY): Payer: Medicare Other

## 2021-09-06 ENCOUNTER — Encounter (HOSPITAL_COMMUNITY): Payer: Self-pay | Admitting: *Deleted

## 2021-09-06 ENCOUNTER — Other Ambulatory Visit: Payer: Self-pay

## 2021-09-06 DIAGNOSIS — W01198A Fall on same level from slipping, tripping and stumbling with subsequent striking against other object, initial encounter: Secondary | ICD-10-CM | POA: Diagnosis not present

## 2021-09-06 DIAGNOSIS — I10 Essential (primary) hypertension: Secondary | ICD-10-CM | POA: Insufficient documentation

## 2021-09-06 DIAGNOSIS — S43004A Unspecified dislocation of right shoulder joint, initial encounter: Secondary | ICD-10-CM

## 2021-09-06 DIAGNOSIS — W19XXXA Unspecified fall, initial encounter: Secondary | ICD-10-CM

## 2021-09-06 DIAGNOSIS — S0990XA Unspecified injury of head, initial encounter: Secondary | ICD-10-CM | POA: Diagnosis not present

## 2021-09-06 DIAGNOSIS — S0081XA Abrasion of other part of head, initial encounter: Secondary | ICD-10-CM | POA: Insufficient documentation

## 2021-09-06 DIAGNOSIS — K5730 Diverticulosis of large intestine without perforation or abscess without bleeding: Secondary | ICD-10-CM | POA: Diagnosis not present

## 2021-09-06 DIAGNOSIS — S43014A Anterior dislocation of right humerus, initial encounter: Secondary | ICD-10-CM | POA: Diagnosis not present

## 2021-09-06 DIAGNOSIS — S3991XA Unspecified injury of abdomen, initial encounter: Secondary | ICD-10-CM | POA: Diagnosis not present

## 2021-09-06 DIAGNOSIS — M19011 Primary osteoarthritis, right shoulder: Secondary | ICD-10-CM | POA: Diagnosis not present

## 2021-09-06 DIAGNOSIS — M2578 Osteophyte, vertebrae: Secondary | ICD-10-CM | POA: Diagnosis not present

## 2021-09-06 DIAGNOSIS — R109 Unspecified abdominal pain: Secondary | ICD-10-CM | POA: Diagnosis not present

## 2021-09-06 DIAGNOSIS — M160 Bilateral primary osteoarthritis of hip: Secondary | ICD-10-CM | POA: Diagnosis not present

## 2021-09-06 DIAGNOSIS — Z043 Encounter for examination and observation following other accident: Secondary | ICD-10-CM | POA: Diagnosis not present

## 2021-09-06 DIAGNOSIS — S299XXA Unspecified injury of thorax, initial encounter: Secondary | ICD-10-CM | POA: Diagnosis not present

## 2021-09-06 DIAGNOSIS — N281 Cyst of kidney, acquired: Secondary | ICD-10-CM | POA: Diagnosis not present

## 2021-09-06 DIAGNOSIS — G319 Degenerative disease of nervous system, unspecified: Secondary | ICD-10-CM | POA: Diagnosis not present

## 2021-09-06 DIAGNOSIS — Z20822 Contact with and (suspected) exposure to covid-19: Secondary | ICD-10-CM | POA: Insufficient documentation

## 2021-09-06 DIAGNOSIS — K449 Diaphragmatic hernia without obstruction or gangrene: Secondary | ICD-10-CM | POA: Diagnosis not present

## 2021-09-06 DIAGNOSIS — Z79899 Other long term (current) drug therapy: Secondary | ICD-10-CM | POA: Insufficient documentation

## 2021-09-06 DIAGNOSIS — M25511 Pain in right shoulder: Secondary | ICD-10-CM | POA: Diagnosis not present

## 2021-09-06 DIAGNOSIS — K8020 Calculus of gallbladder without cholecystitis without obstruction: Secondary | ICD-10-CM | POA: Diagnosis not present

## 2021-09-06 DIAGNOSIS — M47814 Spondylosis without myelopathy or radiculopathy, thoracic region: Secondary | ICD-10-CM | POA: Diagnosis not present

## 2021-09-06 DIAGNOSIS — J9811 Atelectasis: Secondary | ICD-10-CM | POA: Diagnosis not present

## 2021-09-06 DIAGNOSIS — F10920 Alcohol use, unspecified with intoxication, uncomplicated: Secondary | ICD-10-CM

## 2021-09-06 DIAGNOSIS — S43011A Anterior subluxation of right humerus, initial encounter: Secondary | ICD-10-CM | POA: Diagnosis not present

## 2021-09-06 LAB — COMPREHENSIVE METABOLIC PANEL
ALT: 10 U/L (ref 0–44)
AST: 25 U/L (ref 15–41)
Albumin: 3.7 g/dL (ref 3.5–5.0)
Alkaline Phosphatase: 54 U/L (ref 38–126)
Anion gap: 10 (ref 5–15)
BUN: 18 mg/dL (ref 8–23)
CO2: 23 mmol/L (ref 22–32)
Calcium: 8.1 mg/dL — ABNORMAL LOW (ref 8.9–10.3)
Chloride: 105 mmol/L (ref 98–111)
Creatinine, Ser: 1.21 mg/dL — ABNORMAL HIGH (ref 0.44–1.00)
GFR, Estimated: 50 mL/min — ABNORMAL LOW (ref >60)
Glucose, Bld: 64 mg/dL — ABNORMAL LOW (ref 70–99)
Potassium: 3.8 mmol/L (ref 3.5–5.1)
Sodium: 138 mmol/L (ref 135–145)
Total Bilirubin: 0.2 mg/dL — ABNORMAL LOW (ref 0.3–1.2)
Total Protein: 6.5 g/dL (ref 6.5–8.1)

## 2021-09-06 LAB — RESP PANEL BY RT-PCR (FLU A&B, COVID) ARPGX2
Influenza A by PCR: NEGATIVE
Influenza B by PCR: NEGATIVE
SARS Coronavirus 2 by RT PCR: NEGATIVE

## 2021-09-06 LAB — CBC
HCT: 36.4 % (ref 36.0–46.0)
Hemoglobin: 12.3 g/dL (ref 12.0–15.0)
MCH: 30.5 pg (ref 26.0–34.0)
MCHC: 33.8 g/dL (ref 30.0–36.0)
MCV: 90.3 fL (ref 80.0–100.0)
Platelets: 196 10*3/uL (ref 150–400)
RBC: 4.03 MIL/uL (ref 3.87–5.11)
RDW: 15.8 % — ABNORMAL HIGH (ref 11.5–15.5)
WBC: 6.6 10*3/uL (ref 4.0–10.5)
nRBC: 0 % (ref 0.0–0.2)

## 2021-09-06 LAB — PROTIME-INR
INR: 0.8 (ref 0.8–1.2)
Prothrombin Time: 11.5 seconds (ref 11.4–15.2)

## 2021-09-06 LAB — ETHANOL: Alcohol, Ethyl (B): 164 mg/dL — ABNORMAL HIGH (ref <10)

## 2021-09-06 LAB — LACTIC ACID, PLASMA: Lactic Acid, Venous: 3.3 mmol/L (ref 0.5–1.9)

## 2021-09-06 MED ORDER — KETAMINE HCL 50 MG/5ML IJ SOSY
0.3000 mg/kg | PREFILLED_SYRINGE | Freq: Once | INTRAMUSCULAR | Status: DC
  Filled 2021-09-06: qty 5

## 2021-09-06 MED ORDER — FENTANYL CITRATE PF 50 MCG/ML IJ SOSY
50.0000 ug | PREFILLED_SYRINGE | Freq: Once | INTRAMUSCULAR | Status: AC
  Administered 2021-09-06: 50 ug via INTRAVENOUS
  Filled 2021-09-06: qty 1

## 2021-09-06 MED ORDER — LACTATED RINGERS IV BOLUS
1000.0000 mL | Freq: Once | INTRAVENOUS | Status: AC
  Administered 2021-09-06: 1000 mL via INTRAVENOUS

## 2021-09-06 MED ORDER — LIDOCAINE HCL (PF) 1 % IJ SOLN
10.0000 mL | Freq: Once | INTRAMUSCULAR | Status: AC
  Administered 2021-09-07: 10 mL
  Filled 2021-09-06: qty 10

## 2021-09-06 NOTE — ED Notes (Signed)
MD notified of lactic 3.3.

## 2021-09-06 NOTE — ED Triage Notes (Addendum)
Pt states she was walking towards the car and fell, right posterior shoulder pain. Abrasion to the right forehead, denies LOC, no blood thinners.

## 2021-09-06 NOTE — ED Triage Notes (Signed)
Family does report some ETOH use tonight

## 2021-09-06 NOTE — ED Notes (Signed)
IVT at bedside.

## 2021-09-06 NOTE — ED Notes (Signed)
Patient transported to CT 

## 2021-09-06 NOTE — ED Provider Notes (Signed)
[facility] EMERGENCY DEPARTMENT Provider Note   CSN: [id number] Arrival date & time: 09/06/21  2009     History  Chief Complaint  Patient presents with   Maria Murphy is a 66 y.o. female with past medical history significant for HTN, duodenal stricture, osteoarthritis, chronic abdominal pain, depression, history of PE not on blood thinners who presents after a fall.  The patient has reportedly been drinking earlier in the evening and was walking behind a family member when she fell onto concrete.  The patient hit her head, but she denies loss of consciousness.  She is complaining of pain in her right shoulder.     Home Medications Prior to Admission medications   Medication Sig Start Date End Date Taking? Authorizing Provider  acetaminophen (TYLENOL) 500 MG tablet Take 1 tablet (500 mg total) by mouth every 6 (six) hours as needed for mild pain or fever. 09/27/20   Johnson, Clanford L, MD  amLODipine (NORVASC) 10 MG tablet Take 1 tablet (10 mg total) by mouth daily. For BP 08/20/21   Elsie Stain, MD  Ascorbic Acid (VITAMIN C PO) Take 1 tablet by mouth daily.    [provider]  citalopram (CELEXA) 40 MG tablet Take 1 tablet (40 mg total) by mouth at bedtime. 08/20/21   Elsie Stain, MD  donepezil (ARICEPT) 10 MG tablet Take 1 tablet (10 mg total) by mouth in the morning and at bedtime. 08/20/21 11/18/21  Elsie Stain, MD  DULoxetine HCl 40 MG CPEP Take 1 capsule by mouth daily. 08/20/21   Elsie Stain, MD  ferrous sulfate 325 (65 FE) MG tablet Take 325 mg by mouth in the morning. 02/27/21   [provider]  gabapentin (NEURONTIN) 300 MG capsule Take 1 capsule (300 mg total) by mouth 2 (two) times daily. 08/20/21   Elsie Stain, MD  lactose free nutrition (BOOST) LIQD Take 237 mLs by mouth 2 (two) times daily. Boost Breeze Clear    [provider]  lansoprazole (PREVACID SOLUTAB) 30 MG disintegrating tablet  Place 1 tablet on tongue twice daily and allow to dissolve until particles can be swallowed. Do not swallow whole, break, cut, or chew tablet. 08/20/21   Elsie Stain, MD  lidocaine (LIDODERM) 5 % Place 1 patch onto the skin daily. Remove & Discard patch within 12 hours or as directed by MD 08/20/21   Elsie Stain, MD  loperamide (IMODIUM) 2 MG capsule Take 1 capsule (2 mg total) by mouth 2 (two) times daily as needed for diarrhea or loose stools. 03/11/21   Jaynee Eagles, PA-C  promethazine (PHENERGAN) 12.5 MG tablet Take 1 tablet (12.5 mg total) by mouth every 8 (eight) hours as needed for nausea or vomiting. 08/20/21   Elsie Stain, MD  QUEtiapine (SEROQUEL) 50 MG tablet Take 1 tablet (50 mg total) by mouth at bedtime. 08/20/21   Elsie Stain, MD  sucralfate (CARAFATE) 1 GM/10ML suspension Take 10 mLs (1 g total) by mouth 4 (four) times daily -  with meals and at bedtime. 08/20/21   Elsie Stain, MD  tiZANidine (ZANAFLEX) 4 MG tablet Take 1 tablet (4 mg total) by mouth every 8 (eight) hours as needed for muscle spasms. 08/20/21   Elsie Stain, MD  traMADol (ULTRAM) 50 MG tablet Take 1 tablet (50 mg total) by mouth every 8 (eight) hours as needed. 08/20/21   Elsie Stain, MD  traZODone (DESYREL) 100  MG tablet Take 1 tablet (100 mg total) by mouth at bedtime. 08/20/21   Elsie Stain, MD      Allergies    Wheat extract and Ketamine    Review of Systems   Review of Systems  Constitutional:  Negative for fever.  Eyes:  Negative for visual disturbance.  Respiratory:  Negative for cough and shortness of breath.   Cardiovascular:  Negative for chest pain.  Gastrointestinal:  Positive for abdominal pain (chronic). Negative for nausea and vomiting.  Musculoskeletal:  Positive for arthralgias and myalgias. Negative for neck pain.  Skin:  Negative for wound.  Neurological:  Negative for numbness and headaches.   Physical Exam Updated Vital Signs BP 118/75     Pulse 81    Temp 98.7 F (37.1 C) (Oral)    Resp 16    Wt 78.5 kg Comment: from 08/21/21   SpO2 99%    BMI 29.70 kg/m  Physical Exam Constitutional:      General: She is in acute distress.     Appearance: She is not ill-appearing.     Comments: Clinically intoxicated  HENT:     Head: Normocephalic.     Comments: Abrasion to forehead    Right Ear: External ear normal.     Left Ear: External ear normal.     Nose: Nose normal.     Mouth/Throat:     Mouth: Mucous membranes are moist.     Pharynx: Oropharynx is clear.  Eyes:     Extraocular Movements: Extraocular movements intact.     Pupils: Pupils are equal, round, and reactive to light.  Cardiovascular:     Rate and Rhythm: Normal rate and regular rhythm.     Pulses: Normal pulses.          Radial pulses are 2+ on the right side and 2+ on the left side.       Dorsalis pedis pulses are 2+ on the right side and 2+ on the left side.     Heart sounds: Normal heart sounds.  Pulmonary:     Effort: Pulmonary effort is normal.     Breath sounds: Normal breath sounds.  Abdominal:     General: There is no distension.     Palpations: Abdomen is soft.     Tenderness: There is abdominal tenderness. There is guarding (voluntary). There is no rebound.     Hernia: No hernia is present.  Musculoskeletal:        General: Tenderness (R shoulder) and deformity (R shoulder) present.  Skin:    Capillary Refill: Capillary refill takes less than 2 seconds.  Neurological:     General: No focal deficit present.     Mental Status: She is alert and oriented to person, place, and time.     GCS: GCS eye subscore is 4. GCS verbal subscore is 5. GCS motor subscore is 6.  Psychiatric:        Speech: Speech is slurred.    ED Results / Procedures / Treatments   Labs (all labs ordered are listed, but only abnormal results are displayed) Labs Reviewed  COMPREHENSIVE METABOLIC PANEL - Abnormal; Notable for the following components:      Result Value    Glucose, Bld 64 (*)    Creatinine, Ser 1.21 (*)    Calcium 8.1 (*)    Total Bilirubin 0.2 (*)    GFR, Estimated 50 (*)    All other components within normal limits  CBC - Abnormal; Notable  for the following components:   RDW 15.8 (*)    All other components within normal limits  ETHANOL - Abnormal; Notable for the following components:   Alcohol, Ethyl (B) 164 (*)    All other components within normal limits  LACTIC ACID, PLASMA - Abnormal; Notable for the following components:   Lactic Acid, Venous 3.3 (*)    All other components within normal limits  RESP PANEL BY RT-PCR (FLU A&B, COVID) [id number]  PROTIME-INR    EKG None  Radiology DG Pelvis Portable  Result Date: 09/06/2021 CLINICAL DATA:  [place] EXAM: PORTABLE PELVIS 1-2 VIEWS COMPARISON:  01/25/2021 FINDINGS: Single frontal view of the pelvis demonstrates no acute displaced fractures. Mild symmetrical bilateral hip osteoarthritis. Remainder of the bony pelvis is unremarkable. Sacroiliac joints are normal. IMPRESSION: 1. Bilateral hip osteoarthritis.  No acute displaced fracture. Electronically Signed   By: Randa Ngo M.D.   On: 09/06/2021 21:38   DG Chest Port 1 View  Result Date: 09/06/2021 CLINICAL DATA:  [place] EXAM: PORTABLE CHEST 1 VIEW COMPARISON:  02/17/2021 FINDINGS: Single frontal view of the chest demonstrates an unremarkable cardiac silhouette. No airspace disease, effusion, or pneumothorax. Right glenohumeral dislocation. Dedicated right shoulder views are recommended. No acute displaced fractures. IMPRESSION: 1. Right glenohumeral dislocation. 2. Otherwise no acute intrathoracic process. Electronically Signed   By: Randa Ngo M.D.   On: 09/06/2021 21:36   DG Shoulder Right Port  Result Date: 09/06/2021 CLINICAL DATA:  [place] EXAM: RIGHT HUMERUS - 2+ VIEW; RIGHT SHOULDER - 1 VIEW COMPARISON:  03/26/2020 FINDINGS: Right shoulder: Frontal and transscapular views of the right shoulder demonstrate anterior glenohumeral  dislocation. No displaced fracture. Marked spurring of the undersurface of the acromion process. Visualized portions of the right chest are clear. Right humerus: Frontal and lateral views demonstrate anterior glenohumeral dislocation. The right elbow is well aligned. No acute displaced fractures. Soft tissues are unremarkable. IMPRESSION: 1. Anterior glenohumeral dislocation. 2. No acute displaced fractures. Electronically Signed   By: Randa Ngo M.D.   On: 09/06/2021 21:37   DG Humerus Right  Result Date: 09/06/2021 CLINICAL DATA:  [place] EXAM: RIGHT HUMERUS - 2+ VIEW; RIGHT SHOULDER - 1 VIEW COMPARISON:  03/26/2020 FINDINGS: Right shoulder: Frontal and transscapular views of the right shoulder demonstrate anterior glenohumeral dislocation. No displaced fracture. Marked spurring of the undersurface of the acromion process. Visualized portions of the right chest are clear. Right humerus: Frontal and lateral views demonstrate anterior glenohumeral dislocation. The right elbow is well aligned. No acute displaced fractures. Soft tissues are unremarkable. IMPRESSION: 1. Anterior glenohumeral dislocation. 2. No acute displaced fractures. Electronically Signed   By: Randa Ngo M.D.   On: 09/06/2021 21:37    Procedures Procedures   Medications Ordered in ED Medications  lidocaine (PF) (XYLOCAINE) 1 % injection 10 mL (has no administration in time range)  fentaNYL (SUBLIMAZE) injection 50 mcg (50 mcg Intravenous Given 09/06/21 2246)  lactated ringers bolus 1,000 mL (1,000 mLs Intravenous New Bag/Given 09/06/21 2249)    ED Course/ Medical Decision Making/ A&P                            Patient presents after fall as described in HPI above.  Patient is clinically toxic and on physical exam.  I discussed the patient with her family member at bedside who reported that the patient had been drinking earlier in the day.  Initial hypotensive blood pressures documented likely spurious as BP cuff  was not well  fitted and patient was moving around in bed.  Subsequent blood pressure measurements demonstrated normotension.  Patient is complaining of pain in her right shoulder and has obvious asymmetry noted with the shoulder held in an abducted position.  She has good radial pulses in the right arm.  Suspect shoulder dislocation.  The patient also has voluntary guarding of her abdomen on exam.  She is intoxicated and is difficult for me to get a reliable exam.  On review of the patient's chart, she also has frequent ED visits for chronic abdominal pain.  Unclear if this is new from the fall or a chronic problem.  Given unreliable historian and intoxication, will obtain full CT trauma scans.  Plain films of the right shoulder ordered.  IV pain medicine ordered.  Plain films reviewed by myself confirm right anterior shoulder dislocation.  No evidence of fracture.  CT scans pending at the time of handoff to Dr. Dina Rich.  Dr. Dina Rich to perform shoulder reduction and follow-up on CT scans.  Please refer to her documentation for patient's final disposition.  Final Clinical Impression(s) / ED Diagnoses Final diagnoses:  Fall    Rx / DC Orders ED Discharge Orders     None         Saaya Procell, Amalia Hailey, MD 09/07/21 1126    Luna Fuse, MD 09/07/21 2220

## 2021-09-07 ENCOUNTER — Encounter: Payer: Self-pay | Admitting: Hematology and Oncology

## 2021-09-07 ENCOUNTER — Emergency Department (HOSPITAL_COMMUNITY): Payer: Medicare Other

## 2021-09-07 DIAGNOSIS — K5730 Diverticulosis of large intestine without perforation or abscess without bleeding: Secondary | ICD-10-CM | POA: Diagnosis not present

## 2021-09-07 DIAGNOSIS — K8020 Calculus of gallbladder without cholecystitis without obstruction: Secondary | ICD-10-CM | POA: Diagnosis not present

## 2021-09-07 DIAGNOSIS — S0990XA Unspecified injury of head, initial encounter: Secondary | ICD-10-CM | POA: Diagnosis not present

## 2021-09-07 DIAGNOSIS — G319 Degenerative disease of nervous system, unspecified: Secondary | ICD-10-CM | POA: Diagnosis not present

## 2021-09-07 DIAGNOSIS — N281 Cyst of kidney, acquired: Secondary | ICD-10-CM | POA: Diagnosis not present

## 2021-09-07 DIAGNOSIS — K449 Diaphragmatic hernia without obstruction or gangrene: Secondary | ICD-10-CM | POA: Diagnosis not present

## 2021-09-07 DIAGNOSIS — M19011 Primary osteoarthritis, right shoulder: Secondary | ICD-10-CM | POA: Diagnosis not present

## 2021-09-07 DIAGNOSIS — J9811 Atelectasis: Secondary | ICD-10-CM | POA: Diagnosis not present

## 2021-09-07 DIAGNOSIS — M47814 Spondylosis without myelopathy or radiculopathy, thoracic region: Secondary | ICD-10-CM | POA: Diagnosis not present

## 2021-09-07 DIAGNOSIS — S299XXA Unspecified injury of thorax, initial encounter: Secondary | ICD-10-CM | POA: Diagnosis not present

## 2021-09-07 DIAGNOSIS — S43004A Unspecified dislocation of right shoulder joint, initial encounter: Secondary | ICD-10-CM | POA: Diagnosis not present

## 2021-09-07 DIAGNOSIS — S43014A Anterior dislocation of right humerus, initial encounter: Secondary | ICD-10-CM | POA: Diagnosis not present

## 2021-09-07 DIAGNOSIS — S3991XA Unspecified injury of abdomen, initial encounter: Secondary | ICD-10-CM | POA: Diagnosis not present

## 2021-09-07 DIAGNOSIS — M2578 Osteophyte, vertebrae: Secondary | ICD-10-CM | POA: Diagnosis not present

## 2021-09-07 MED ORDER — IOHEXOL 300 MG/ML  SOLN
100.0000 mL | Freq: Once | INTRAMUSCULAR | Status: AC | PRN
  Administered 2021-09-07: 100 mL via INTRAVENOUS

## 2021-09-07 MED ORDER — MORPHINE SULFATE (PF) 2 MG/ML IV SOLN
2.0000 mg | Freq: Once | INTRAVENOUS | Status: AC
  Administered 2021-09-07: 2 mg via INTRAVENOUS
  Filled 2021-09-07: qty 1

## 2021-09-07 NOTE — Progress Notes (Signed)
Orthopedic Tech Progress Note Patient Details:  Maria Murphy 04-Feb-1956 [id number]  Ortho Devices Type of Ortho Device: Sling immobilizer Ortho Device/Splint Location: rue Ortho Device/Splint Interventions: Ordered, Application, Adjustment   Post Interventions Patient Tolerated: Well Instructions Provided: Care of device, Adjustment of device  Karolee Stamps 09/07/2021, 1:54 AM

## 2021-09-07 NOTE — Discharge Instructions (Signed)
You were seen today after a fall.  Your blood alcohol levels were elevated.  You had a right shoulder dislocation.  This was reduced.  Follow-up with Dr. Percell Miller.  Avoid extreme positioning of the right shoulder as you will be more prone to dislocated it again.

## 2021-09-07 NOTE — ED Provider Notes (Signed)
Patient signed out pending imaging and shoulder reduction.  Alcohol level 163.  On my evaluation, she is awake, alert.  Complaining of right shoulder pain.  Physical Exam  BP 118/75    Pulse 81    Temp 98.7 F (37.1 C) (Oral)    Resp 16    Wt 78.5 kg Comment: from 08/21/21   SpO2 99%    BMI 29.70 kg/m   Physical Exam Constitutional:      Appearance: Normal appearance. She is not ill-appearing.  Musculoskeletal:     Comments: Right shoulder asymmetry noted with avoid glenoid, shoulder held in an adducted position, 2+ radial pulse, neurovascular intact  Neurological:     Mental Status: She is alert.    Procedures  Reduction of dislocation  Date/Time: 09/07/2021 1:24 AM Performed by: Merryl Hacker, MD Authorized by: Merryl Hacker, MD  Consent: Verbal consent obtained. Consent given by: patient Time out: Immediately prior to procedure a "time out" was called to verify the correct patient, procedure, equipment, support staff and site/side marked as required. Local anesthesia used: yes Anesthesia: local infiltration  Anesthesia: Local anesthesia used: yes Local Anesthetic: lidocaine 2% without epinephrine Anesthetic total: 10 mL  Sedation: Patient sedated: no  Patient tolerance: patient tolerated the procedure well with no immediate complications Comments: Shoulder reduced easily with local joint injection, axial traction and external rotation    ED Course / MDM    Medical Decision Making  Patient presents following a fall.  Only obvious injury is a right shoulder dislocation.  CT scan imaging and x-rays reviewed by myself and showed no obvious additional traumatic injuries.  She does have some incidental findings.  She has a stable lung nodule that does not require further evaluation.  She is nontoxic.  Shoulder was reduced at bedside without difficulty.  Repeat x-ray shows good reduction.  She did receive IV pain medication.  Patient follows with Dr. Percell Miller.  We will  have her follow-up as an outpatient with Dr. Percell Miller.  She is ambulatory at discharge.  Labs notable for BAL of 163. X-rays notable for dislocated right shoulder  Disposition Home with family support     Merryl Hacker, MD 09/07/21 [phone]

## 2021-09-10 DIAGNOSIS — M25551 Pain in right hip: Secondary | ICD-10-CM | POA: Diagnosis not present

## 2021-09-10 DIAGNOSIS — M25511 Pain in right shoulder: Secondary | ICD-10-CM | POA: Diagnosis not present

## 2021-09-15 ENCOUNTER — Encounter: Payer: Self-pay | Admitting: Hematology and Oncology

## 2021-09-15 DIAGNOSIS — M545 Low back pain, unspecified: Secondary | ICD-10-CM | POA: Diagnosis not present

## 2021-09-15 DIAGNOSIS — M25551 Pain in right hip: Secondary | ICD-10-CM | POA: Diagnosis not present

## 2021-09-17 ENCOUNTER — Ambulatory Visit (INDEPENDENT_AMBULATORY_CARE_PROVIDER_SITE_OTHER): Payer: Commercial Managed Care - HMO | Admitting: Adult Health

## 2021-09-17 ENCOUNTER — Other Ambulatory Visit: Payer: Self-pay

## 2021-09-17 ENCOUNTER — Encounter: Payer: Self-pay | Admitting: Adult Health

## 2021-09-17 ENCOUNTER — Other Ambulatory Visit (HOSPITAL_COMMUNITY)
Admission: RE | Admit: 2021-09-17 | Discharge: 2021-09-17 | Disposition: A | Discharge status: Home / Self Care | Payer: Commercial Managed Care - HMO | Source: Ambulatory Visit | Attending: Adult Health | Admitting: Adult Health

## 2021-09-17 VITALS — BP 157/94 | HR 77 | Ht 64.0 in | Wt 176.0 lb

## 2021-09-17 DIAGNOSIS — Z1211 Encounter for screening for malignant neoplasm of colon: Secondary | ICD-10-CM

## 2021-09-17 DIAGNOSIS — Z780 Asymptomatic menopausal state: Secondary | ICD-10-CM | POA: Diagnosis not present

## 2021-09-17 DIAGNOSIS — Z01419 Encounter for gynecological examination (general) (routine) without abnormal findings: Secondary | ICD-10-CM

## 2021-09-17 DIAGNOSIS — R1031 Right lower quadrant pain: Secondary | ICD-10-CM | POA: Insufficient documentation

## 2021-09-17 DIAGNOSIS — F32A Depression, unspecified: Secondary | ICD-10-CM | POA: Diagnosis not present

## 2021-09-17 DIAGNOSIS — F419 Anxiety disorder, unspecified: Secondary | ICD-10-CM | POA: Insufficient documentation

## 2021-09-17 LAB — HEMOCCULT GUIAC POC 1CARD (OFFICE): Fecal Occult Blood, POC: NEGATIVE

## 2021-09-17 NOTE — Progress Notes (Signed)
Patient ID: Maria Murphy, female   DOB: 02/05/1956, 66 y.o.   MRN: [id number] History of Present Illness: Maria Murphy is a 66 year old female, widowed, PM in for well woman gyn exam and pap. Has had pain RLQ for about 6 months.  She fell about 8 days and dislocated her right shoulder, and has sling on.  Lab Results  Component Value Date   DIAGPAP  02/13/2019    NEGATIVE FOR INTRAEPITHELIAL LESIONS OR MALIGNANCY.   HPV NOT DETECTED 02/13/2019     Current Medications, Allergies, Past Medical History, Past Surgical History, Family History and Social History were reviewed in [employer] record.     Review of Systems: Patient denies any daily headaches, hearing loss, fatigue, blurred vision, shortness of breath, chest pain, abdominal pain, problems with bowel movements, urination, or intercourse.(Not active).  No joint pain or mood swings.  Denies any vaginal bleeding.   Physical Exam:BP (!) 157/94 (BP Location: Left Arm, Patient Position: Sitting, Cuff Size: Normal)    Pulse 77    Ht 5\' 4"  (1.626 m)    Wt 176 lb (79.8 kg)    BMI 30.21 kg/m   General:  Well developed, well nourished, no acute distress Skin:  Warm and dry Neck:  Midline trachea, normal thyroid, good ROM, no lymphadenopathy,no carotid bruits heard Lungs; Clear to auscultation bilaterally Breast:  Deferred  Cardiovascular: Regular rate and rhythm Abdomen:  Soft, no hepatosplenomegaly Pelvic:  External genitalia is normal in appearance, no lesions.  The vagina is pale with loss of rugae. Urethra has no lesions or masses. The cervix is smooth,pap with HR HPV genotyping performed.  Uterus is felt to be normal size, shape, and contour.  No adnexal masses + RLQ tenderness noted.Bladder is non tender, no masses felt. Has some tenderness in abdominal wall too Rectal: Good sphincter tone, no polyps, or hemorrhoids felt.  Hemoccult negative. Extremities/musculoskeletal:  No swelling or varicosities noted, no clubbing  or cyanosis Psych:  No mood changes, alert and cooperative,seems happy AA is 3 Fall risk is moderate Depression screen [facility] 2/9 09/17/2021 08/20/2021 03/20/2021  Decreased Interest 2 2 3   Down, Depressed, Hopeless 2 2 1   PHQ - 2 Score 4 4 4   Altered sleeping 2 2 2   Tired, decreased energy 3 2 2   Change in appetite 1 2 2   Feeling bad or failure about yourself  1 2 1   Trouble concentrating 2 3 2   Moving slowly or fidgety/restless 1 2 2   Suicidal thoughts 3 2 0  PHQ-9 Score 17 19 15   Difficult doing work/chores - - -  Some encounter information is confidential and restricted. Go to Review Flowsheets activity to see all data.  Some recent data might be hidden    She is on meds, and denies being suicidal  GAD 7 : Generalized Anxiety Score 09/17/2021 08/20/2021 03/20/2021 12/09/2020  Nervous, Anxious, on Edge 3 2 1 3   Control/stop worrying 3 3 2 3   Worry too much - different things 3 3 2 3   Trouble relaxing 3 2 2 3   Restless 2 2 2 3   Easily annoyed or irritable 3 2 1 3   Afraid - awful might happen 0 1 1 2   Total GAD 7 Score 17 15 11 20       Upstream - 09/17/21 1431       Pregnancy Intention Screening   Does the patient want to become pregnant in the next year? N/A    Does the patient's partner want to  become pregnant in the next year? N/A    Would the patient like to discuss contraceptive options today? N/A      Contraception Wrap Up   Current Method No Method - Other Reason   postmenopausal   End Method No Method - Other Reason   PM   Contraception Counseling Provided No            Examination chaperoned by Balinda Quails [employer] student who assisted in exam.  Impression and Plan: 1. Encounter for gynecological examination with Papanicolaou smear of cervix Pap sent Physical with PCP next year No more paps needed if this one normal Mammogram every 2 years  - Cytology - PAP  2. Encounter for screening fecal occult blood testing  - POCT occult blood stool  3. Pain,  abdominal, RLQ [place] scheduled at [facility] 09/24/21 at 1:30 pm to assess uterus and ovaries  - US PELVIC COMPLETE WITH TRANSVAGINAL; Future  4. Postmenopause  5. Anxiety and depression Follow up with PCP

## 2021-09-19 LAB — CYTOLOGY - PAP
Comment: NEGATIVE
Diagnosis: NEGATIVE
High risk HPV: NEGATIVE

## 2021-09-22 ENCOUNTER — Inpatient Hospital Stay (HOSPITAL_COMMUNITY): Payer: Commercial Managed Care - HMO | Attending: Hematology

## 2021-09-22 ENCOUNTER — Other Ambulatory Visit: Payer: Self-pay

## 2021-09-22 DIAGNOSIS — D500 Iron deficiency anemia secondary to blood loss (chronic): Secondary | ICD-10-CM | POA: Insufficient documentation

## 2021-09-22 DIAGNOSIS — C44311 Basal cell carcinoma of skin of nose: Secondary | ICD-10-CM | POA: Diagnosis not present

## 2021-09-22 LAB — CBC WITH DIFFERENTIAL/PLATELET
Abs Immature Granulocytes: 0.02 10*3/uL (ref 0.00–0.07)
Basophils Absolute: 0 10*3/uL (ref 0.0–0.1)
Basophils Relative: 1 %
Eosinophils Absolute: 0.1 10*3/uL (ref 0.0–0.5)
Eosinophils Relative: 2 %
HCT: 39.2 % (ref 36.0–46.0)
Hemoglobin: 12.8 g/dL (ref 12.0–15.0)
Immature Granulocytes: 0 %
Lymphocytes Relative: 20 %
Lymphs Abs: 1.5 10*3/uL (ref 0.7–4.0)
MCH: 29.6 pg (ref 26.0–34.0)
MCHC: 32.7 g/dL (ref 30.0–36.0)
MCV: 90.5 fL (ref 80.0–100.0)
Monocytes Absolute: 0.6 10*3/uL (ref 0.1–1.0)
Monocytes Relative: 8 %
Neutro Abs: 5.2 10*3/uL (ref 1.7–7.7)
Neutrophils Relative %: 69 %
Platelets: 206 10*3/uL (ref 150–400)
RBC: 4.33 MIL/uL (ref 3.87–5.11)
RDW: 16.3 % — ABNORMAL HIGH (ref 11.5–15.5)
WBC: 7.5 10*3/uL (ref 4.0–10.5)
nRBC: 0 % (ref 0.0–0.2)

## 2021-09-22 LAB — IRON AND TIBC
Iron: 73 ug/dL (ref 28–170)
Saturation Ratios: 16 % (ref 10.4–31.8)
TIBC: 451 ug/dL — ABNORMAL HIGH (ref 250–450)
UIBC: 378 ug/dL

## 2021-09-22 LAB — FERRITIN: Ferritin: 18 ng/mL (ref 11–307)

## 2021-09-23 ENCOUNTER — Ambulatory Visit (HOSPITAL_COMMUNITY): Payer: Commercial Managed Care - HMO | Admitting: Occupational Therapy

## 2021-09-23 DIAGNOSIS — M25511 Pain in right shoulder: Secondary | ICD-10-CM | POA: Diagnosis not present

## 2021-09-23 DIAGNOSIS — M25551 Pain in right hip: Secondary | ICD-10-CM | POA: Diagnosis not present

## 2021-09-23 DIAGNOSIS — R69 Illness, unspecified: Secondary | ICD-10-CM | POA: Diagnosis not present

## 2021-09-24 ENCOUNTER — Emergency Department (HOSPITAL_COMMUNITY)
Admission: EM | Admit: 2021-09-24 | Discharge: 2021-09-25 | Disposition: A | Discharge status: Home / Self Care | Payer: Medicare Other | Attending: Emergency Medicine | Admitting: Emergency Medicine

## 2021-09-24 ENCOUNTER — Other Ambulatory Visit: Payer: Self-pay

## 2021-09-24 ENCOUNTER — Ambulatory Visit: Payer: Self-pay | Admitting: *Deleted

## 2021-09-24 ENCOUNTER — Encounter (HOSPITAL_COMMUNITY): Payer: Self-pay | Admitting: Emergency Medicine

## 2021-09-24 ENCOUNTER — Encounter: Payer: Self-pay | Admitting: Hematology and Oncology

## 2021-09-24 ENCOUNTER — Emergency Department (HOSPITAL_COMMUNITY)
Admission: RE | Admit: 2021-09-24 | Discharge: 2021-09-24 | Disposition: A | Discharge status: Home / Self Care | Payer: Medicare Other | Source: Ambulatory Visit | Attending: Adult Health | Admitting: Adult Health

## 2021-09-24 DIAGNOSIS — Z85828 Personal history of other malignant neoplasm of skin: Secondary | ICD-10-CM | POA: Insufficient documentation

## 2021-09-24 DIAGNOSIS — R6889 Other general symptoms and signs: Secondary | ICD-10-CM | POA: Diagnosis not present

## 2021-09-24 DIAGNOSIS — K862 Cyst of pancreas: Secondary | ICD-10-CM | POA: Insufficient documentation

## 2021-09-24 DIAGNOSIS — R1084 Generalized abdominal pain: Secondary | ICD-10-CM | POA: Diagnosis not present

## 2021-09-24 DIAGNOSIS — M160 Bilateral primary osteoarthritis of hip: Secondary | ICD-10-CM | POA: Diagnosis not present

## 2021-09-24 DIAGNOSIS — R1031 Right lower quadrant pain: Secondary | ICD-10-CM

## 2021-09-24 DIAGNOSIS — R197 Diarrhea, unspecified: Secondary | ICD-10-CM | POA: Diagnosis not present

## 2021-09-24 DIAGNOSIS — Z743 Need for continuous supervision: Secondary | ICD-10-CM | POA: Diagnosis not present

## 2021-09-24 DIAGNOSIS — Z79899 Other long term (current) drug therapy: Secondary | ICD-10-CM | POA: Diagnosis not present

## 2021-09-24 DIAGNOSIS — R1030 Lower abdominal pain, unspecified: Secondary | ICD-10-CM | POA: Diagnosis present

## 2021-09-24 DIAGNOSIS — R110 Nausea: Secondary | ICD-10-CM | POA: Diagnosis not present

## 2021-09-24 DIAGNOSIS — R140 Abdominal distension (gaseous): Secondary | ICD-10-CM | POA: Diagnosis not present

## 2021-09-24 DIAGNOSIS — R109 Unspecified abdominal pain: Secondary | ICD-10-CM | POA: Diagnosis not present

## 2021-09-24 DIAGNOSIS — I10 Essential (primary) hypertension: Secondary | ICD-10-CM | POA: Diagnosis not present

## 2021-09-24 DIAGNOSIS — K6389 Other specified diseases of intestine: Secondary | ICD-10-CM | POA: Diagnosis not present

## 2021-09-24 LAB — CBC
HCT: 37.3 % (ref 36.0–46.0)
Hemoglobin: 12.7 g/dL (ref 12.0–15.0)
MCH: 30.8 pg (ref 26.0–34.0)
MCHC: 34 g/dL (ref 30.0–36.0)
MCV: 90.5 fL (ref 80.0–100.0)
Platelets: 211 K/uL (ref 150–400)
RBC: 4.12 MIL/uL (ref 3.87–5.11)
RDW: 16.1 % — ABNORMAL HIGH (ref 11.5–15.5)
WBC: 10.3 K/uL (ref 4.0–10.5)
nRBC: 0 % (ref 0.0–0.2)

## 2021-09-24 NOTE — Telephone Encounter (Signed)
Per agent a nurse was calling to report PAD scores. CAll dropped or hung up before transferred to NT. Attempted to reach nurse at number she called from, left VM. Attempted to reach pt, "Call cannot be completed at this time." Reason for Disposition  Caller has cancelled the call before the first contact  Protocols used: No Contact or Duplicate Contact Call-A-AH

## 2021-09-24 NOTE — ED Triage Notes (Signed)
Pt c/o lower abd pain cramping that radiates to back since this morning with nausea.

## 2021-09-25 ENCOUNTER — Emergency Department (HOSPITAL_COMMUNITY): Payer: Medicare Other

## 2021-09-25 ENCOUNTER — Other Ambulatory Visit: Payer: Self-pay

## 2021-09-25 DIAGNOSIS — M160 Bilateral primary osteoarthritis of hip: Secondary | ICD-10-CM | POA: Diagnosis not present

## 2021-09-25 DIAGNOSIS — R1084 Generalized abdominal pain: Secondary | ICD-10-CM | POA: Diagnosis not present

## 2021-09-25 DIAGNOSIS — K6389 Other specified diseases of intestine: Secondary | ICD-10-CM | POA: Diagnosis not present

## 2021-09-25 DIAGNOSIS — R109 Unspecified abdominal pain: Secondary | ICD-10-CM | POA: Diagnosis not present

## 2021-09-25 DIAGNOSIS — R140 Abdominal distension (gaseous): Secondary | ICD-10-CM | POA: Diagnosis not present

## 2021-09-25 LAB — URINALYSIS, ROUTINE W REFLEX MICROSCOPIC
Bilirubin Urine: NEGATIVE
Glucose, UA: NEGATIVE mg/dL
Hgb urine dipstick: NEGATIVE
Ketones, ur: NEGATIVE mg/dL
Nitrite: NEGATIVE
Protein, ur: NEGATIVE mg/dL
Specific Gravity, Urine: 1.02 (ref 1.005–1.030)
pH: 7 (ref 5.0–8.0)

## 2021-09-25 LAB — URINALYSIS, MICROSCOPIC (REFLEX)

## 2021-09-25 LAB — COMPREHENSIVE METABOLIC PANEL
ALT: 8 U/L (ref 0–44)
AST: 16 U/L (ref 15–41)
Albumin: 3.9 g/dL (ref 3.5–5.0)
Alkaline Phosphatase: 44 U/L (ref 38–126)
Anion gap: 4 — ABNORMAL LOW (ref 5–15)
BUN: 14 mg/dL (ref 8–23)
CO2: 22 mmol/L (ref 22–32)
Calcium: 8.6 mg/dL — ABNORMAL LOW (ref 8.9–10.3)
Chloride: 109 mmol/L (ref 98–111)
Creatinine, Ser: 0.64 mg/dL (ref 0.44–1.00)
GFR, Estimated: >60 mL/min (ref >60)
Glucose, Bld: 156 mg/dL — ABNORMAL HIGH (ref 70–99)
Potassium: 4.4 mmol/L (ref 3.5–5.1)
Sodium: 135 mmol/L (ref 135–145)
Total Bilirubin: 0.5 mg/dL (ref 0.3–1.2)
Total Protein: 6.7 g/dL (ref 6.5–8.1)

## 2021-09-25 LAB — LACTIC ACID, PLASMA: Lactic Acid, Venous: 0.8 mmol/L (ref 0.5–1.9)

## 2021-09-25 LAB — LIPASE, BLOOD: Lipase: 34 U/L (ref 11–51)

## 2021-09-25 MED ORDER — FENTANYL CITRATE PF 50 MCG/ML IJ SOSY
100.0000 ug | PREFILLED_SYRINGE | Freq: Once | INTRAMUSCULAR | Status: AC
  Administered 2021-09-25: 100 ug via INTRAVENOUS
  Filled 2021-09-25: qty 2

## 2021-09-25 MED ORDER — LACTATED RINGERS IV BOLUS
500.0000 mL | Freq: Once | INTRAVENOUS | Status: AC
  Administered 2021-09-25: 500 mL via INTRAVENOUS

## 2021-09-25 MED ORDER — ONDANSETRON HCL 4 MG/2ML IJ SOLN
4.0000 mg | Freq: Once | INTRAMUSCULAR | Status: AC
  Administered 2021-09-25: 4 mg via INTRAVENOUS
  Filled 2021-09-25: qty 2

## 2021-09-25 NOTE — Telephone Encounter (Signed)
Noted  

## 2021-09-25 NOTE — Discharge Instructions (Signed)
°  SEEK IMMEDIATE MEDICAL ATTENTION IF: °The pain does not go away or becomes severe, particularly over the next 8-12 hours.  °A temperature above 100.4F develops.  °Repeated vomiting occurs (multiple episodes).  ° °Blood is being passed in stools or vomit (bright red or black tarry stools).  °Return also if you develop chest pain, difficulty breathing, dizziness or fainting, or become confused, poorly responsive, or inconsolable. ° °

## 2021-09-25 NOTE — ED Provider Notes (Signed)
[facility] EMERGENCY DEPARTMENT Provider Note   CSN: [id number] Arrival date & time: 09/24/21  2012     History  Chief Complaint  Patient presents with   Abdominal Pain    Maria Murphy is a 66 y.o. female.  The history is provided by the patient.  Abdominal Pain Pain quality: aching and cramping   Pain severity:  Moderate Onset quality:  Gradual Duration:  1 day Timing:  Intermittent Progression:  Worsening Chronicity:  Recurrent Context: not alcohol use   Relieved by:  Nothing Worsened by:  Movement and palpation Associated symptoms: diarrhea and nausea   Associated symptoms: no chest pain, no dysuria, no fever, no hematochezia, no shortness of breath and no vomiting   Risk factors: no alcohol abuse and no NSAID use   Patient with history of hypertension, previous PE, peptic ulcer previous surgery presents with abdominal pain.  She reports lower abdominal cramping with some back pain. No fevers or vomiting.  She does report nonbloody diarrhea. She reports previous history of abdominal pain.  She took 2 Tylenol without relief. Denies alcohol abuse Past Medical History:  Diagnosis Date   Acute saddle pulmonary embolism without acute cor pulmonale (HCC)    Basal cell carcinoma 01/16/2021   L nose, MOHs 04/14/21   Collagen vascular disease ([place])    Duodenal ulcer    Essential hypertension, benign 01/24/2015   Family history of ovarian cancer 01/06/2021   Family history of pancreatic cancer 01/06/2021   Family history of uterine cancer 01/06/2021   Foot ulcer ([place])    GERD (gastroesophageal reflux disease)    Hypertension    Memory loss    Peptic ulcer with perforation ([place])    Perforated abdominal viscus 04/22/2020   PUD (peptic ulcer disease)    with perforation s/p exploratory laparotomy   Pulmonary embolus ([place]) 04/2020   Skin cancer    Suicidal ideations 08/07/2020   Vision abnormalities     Home Medications Prior to Admission medications   Medication  Sig Start Date End Date Taking? Authorizing Provider  acetaminophen (TYLENOL) 500 MG tablet Take 1 tablet (500 mg total) by mouth every 6 (six) hours as needed for mild pain or fever. 09/27/20   Johnson, Clanford L, MD  amLODipine (NORVASC) 10 MG tablet Take 1 tablet (10 mg total) by mouth daily. For BP 08/20/21   Elsie Stain, MD  Ascorbic Acid (VITAMIN C PO) Take 1 tablet by mouth daily.    [provider]  citalopram (CELEXA) 40 MG tablet Take 1 tablet (40 mg total) by mouth at bedtime. 08/20/21   Elsie Stain, MD  donepezil (ARICEPT) 10 MG tablet Take 1 tablet (10 mg total) by mouth in the morning and at bedtime. 08/20/21 11/18/21  Elsie Stain, MD  DULoxetine HCl 40 MG CPEP Take 1 capsule by mouth daily. 08/20/21   Elsie Stain, MD  ferrous sulfate 325 (65 FE) MG tablet Take 325 mg by mouth in the morning. 02/27/21   [provider]  gabapentin (NEURONTIN) 300 MG capsule Take 1 capsule (300 mg total) by mouth 2 (two) times daily. 08/20/21   Elsie Stain, MD  ibuprofen (ADVIL) 800 MG tablet Take 800 mg by mouth 3 (three) times daily. 09/15/21   [provider]  lactose free nutrition (BOOST) LIQD Take 237 mLs by mouth 2 (two) times daily. Boost Breeze Clear    [provider]  lansoprazole (PREVACID SOLUTAB) 30 MG disintegrating tablet Place 1 tablet on tongue  twice daily and allow to dissolve until particles can be swallowed. Do not swallow whole, break, cut, or chew tablet. 08/20/21   Elsie Stain, MD  lidocaine (LIDODERM) 5 % Place 1 patch onto the skin daily. Remove & Discard patch within 12 hours or as directed by MD 08/20/21   Elsie Stain, MD  loperamide (IMODIUM) 2 MG capsule Take 1 capsule (2 mg total) by mouth 2 (two) times daily as needed for diarrhea or loose stools. 03/11/21   Jaynee Eagles, PA-C  predniSONE (STERAPRED UNI-PAK 48 TAB) 10 MG (48) TBPK tablet Take by mouth as directed. 09/10/21   [provider]   promethazine (PHENERGAN) 12.5 MG tablet Take 1 tablet (12.5 mg total) by mouth every 8 (eight) hours as needed for nausea or vomiting. 08/20/21   Elsie Stain, MD  QUEtiapine (SEROQUEL) 50 MG tablet Take 1 tablet (50 mg total) by mouth at bedtime. 08/20/21   Elsie Stain, MD  sucralfate (CARAFATE) 1 GM/10ML suspension Take 10 mLs (1 g total) by mouth 4 (four) times daily -  with meals and at bedtime. 08/20/21   Elsie Stain, MD  tiZANidine (ZANAFLEX) 4 MG tablet Take 1 tablet (4 mg total) by mouth every 8 (eight) hours as needed for muscle spasms. 08/20/21   Elsie Stain, MD  traMADol (ULTRAM) 50 MG tablet Take 1 tablet (50 mg total) by mouth every 8 (eight) hours as needed. 08/20/21   Elsie Stain, MD  traZODone (DESYREL) 100 MG tablet Take 1 tablet (100 mg total) by mouth at bedtime. 08/20/21   Elsie Stain, MD      Allergies    Wheat extract and Ketamine    Review of Systems   Review of Systems  Constitutional:  Negative for fever.  Respiratory:  Negative for shortness of breath.   Cardiovascular:  Negative for chest pain.  Gastrointestinal:  Positive for abdominal pain, diarrhea and nausea. Negative for hematochezia and vomiting.  Genitourinary:  Negative for dysuria.  All other systems reviewed and are negative.  Physical Exam Updated Vital Signs BP 131/78    Pulse 81    Temp 98.1 F (36.7 C) (Oral)    Resp 18    Ht 1.626 m (5\' 4" )    Wt 79.8 kg    SpO2 94%    BMI 30.20 kg/m  Physical Exam CONSTITUTIONAL: Elderly, no acute distress HEAD: Normocephalic/atraumatic EYES: EOMI/PERRL, no icterus ENMT: Mucous membranes moist NECK: supple no meningeal signs SPINE/BACK:entire spine nontender CV: S1/S2 noted, no murmurs/rubs/gallops noted LUNGS: Lungs are clear to auscultation bilaterally, no apparent distress ABDOMEN: soft, mild lower abdominal tenderness, no rebound or guarding, bowel sounds noted throughout abdomen GU:no cva tenderness NEURO: Pt is  awake/alert/appropriate, moves all extremitiesx4.  No facial droop.   EXTREMITIES: pulses normal/equal, full ROM SKIN: warm, color normal PSYCH: no abnormalities of mood noted, alert and oriented to situation  ED Results / Procedures / Treatments   Labs (all labs ordered are listed, but only abnormal results are displayed) Labs Reviewed  COMPREHENSIVE METABOLIC PANEL - Abnormal; Notable for the following components:      Result Value   Glucose, Bld 156 (*)    Calcium 8.6 (*)    Anion gap 4 (*)    All other components within normal limits  CBC - Abnormal; Notable for the following components:   RDW 16.1 (*)    All other components within normal limits  URINALYSIS, ROUTINE W REFLEX MICROSCOPIC - Abnormal; Notable  for the following components:   Leukocytes,Ua TRACE (*)    All other components within normal limits  URINALYSIS, MICROSCOPIC (REFLEX) - Abnormal; Notable for the following components:   Bacteria, UA RARE (*)    All other components within normal limits  LIPASE, BLOOD  LACTIC ACID, PLASMA    EKG EKG Interpretation  Date/Time:  Thursday September 25 2021 01:44:59 EST Ventricular Rate:  70 PR Interval:  144 QRS Duration: 93 QT Interval:  456 QTC Calculation: 493 R Axis:   -30 Text Interpretation: Sinus rhythm Probable left atrial enlargement Left axis deviation Low voltage, precordial leads Borderline prolonged QT interval Confirmed by Ripley Fraise [phone]) on 09/25/2021 1:49:49 AM  Radiology DG Abdomen Acute W/Chest  Result Date: 09/25/2021 CLINICAL DATA:  Pain. EXAM: DG ABDOMEN ACUTE WITH 1 VIEW CHEST COMPARISON:  CT abdomen and pelvis 01/25/2021. FINDINGS: Lung volumes are low. There is a band of opacity in the right upper lobe. No pleural effusion or pneumothorax. Cardiomediastinal silhouette is within normal limits. There is no free air under the diaphragm. There is gaseous distention of the colon. Air seen to the level the rectum. Air-fluid levels noted in the  stomach. No significant small bowel gas identified. Rounded calcifications in the pelvis are likely phleboliths. No acute fractures are seen. There are degenerative changes of both hips in the lower lumbar spine. IMPRESSION: 1. Nonobstructive bowel gas pattern. Gaseous distention of the colon. 2. Band of opacity in the right upper lobe favored is atelectasis. Electronically Signed   By: Ronney Asters M.D.   On: 09/25/2021 01:36   US PELVIC COMPLETE WITH TRANSVAGINAL  Result Date: 09/24/2021 CLINICAL DATA:  Pelvic pain, pain right lower quadrant EXAM: TRANSABDOMINAL AND TRANSVAGINAL ULTRASOUND OF PELVIS DOPPLER ULTRASOUND OF OVARIES TECHNIQUE: Both transabdominal and transvaginal ultrasound examinations of the pelvis were performed. Transabdominal technique was performed for global imaging of the pelvis including uterus, ovaries, adnexal regions, and pelvic cul-de-sac. It was necessary to proceed with endovaginal exam following the transabdominal exam to visualize the ovaries. Color and duplex Doppler ultrasound was utilized to evaluate blood flow to the ovaries. COMPARISON:  CT abdomen done on 01/25/2021 FINDINGS: Uterus Measurements: 7.5 x 4.5 x 6.2 cm = volume: 108.03 mL. There is inhomogeneous echogenicity in myometrium with multiple fibroids. There is 2.2 x 2.4 cm fibroid in the right fundus. There is 2.3 x 1.9 cm fibroid in the fundus. There is 3.8 x 3.6 cm fibroid in the posterior body. Endometrium Endometrium is not distinctly visualized due to markedly inhomogeneous echogenicity in the myometrium. Right ovary Measurements: 1.3 x 1.1 x 1 cm = volume: 5.3 cm mL. Normal appearance/no adnexal mass. Left ovary Measurements: 1.9 x 0.8 x 0.8 cm. Normal appearance/no adnexal mass. Color doppler evaluation of both ovaries demonstrates vascular flow in both adnexal regions. Other findings No abnormal free fluid. IMPRESSION: There are multiple fibroids in uterus. No other sonographic abnormality is seen in the  pelvis. Electronically Signed   By: Elmer Picker M.D.   On: 09/24/2021 15:29    Procedures Procedures    Medications Ordered in ED Medications  fentaNYL (SUBLIMAZE) injection 100 mcg (100 mcg Intravenous Given 09/25/21 0100)  ondansetron (ZOFRAN) injection 4 mg (4 mg Intravenous Given 09/25/21 0106)  lactated ringers bolus 500 mL (0 mLs Intravenous Stopped 09/25/21 0226)    ED Course/ Medical Decision Making/ A&P Clinical Course as of 09/25/21 0321  Thu Sep 25, 2021  0155 Patient with history of chronic abdominal pain presents with lower abdominal pain.  Work-up is pending at this time [DW]    Clinical Course User Index [DW] Ripley Fraise, MD                           Medical Decision Making Amount and/or Complexity of Data Reviewed Labs: ordered. Radiology: ordered. ECG/medicine tests: ordered.  Risk Prescription drug management.   This patient presents to the ED for concern of abdominal pain, this involves an extensive number of treatment options, and is a complaint that carries with it a high risk of complications and morbidity.  The differential diagnosis includes bowel perforation, bowel obstruction, gastritis, enteritis, appendicitis, pancreatitis, cholecystitis  Comorbidities that complicate the patient evaluation: Patients presentation is complicated by their history of hypertension  Additional history obtained: Records reviewed Primary Care Documents  Lab Tests: I Ordered, and personally interpreted labs.  The pertinent results include: Mild hyperglycemia  Imaging Studies ordered: I ordered imaging studies including X-ray acute abdominal series I independently visualized and interpreted imaging which showed no acute findings I agree with the radiologist interpretation  Cardiac Monitoring: The patient was maintained on a cardiac monitor.  I personally viewed and interpreted the cardiac monitor which showed an underlying rhythm of:  sinus  rhythm  Medicines ordered and prescription drug management: I ordered medication including fentanyl for pain Reevaluation of the patient after these medicines showed that the patient    improved   Reevaluation: After the interventions noted above, I reevaluated the patient and found that they have :improved  Complexity of problems addressed: Patients presentation is most consistent with  exacerbation of chronic illness      Disposition: After consideration of the diagnostic results and the patients response to treatment,  I feel that the patent would benefit from discharge .    Patient presented with recurrent abdominal pain and cramping.  Overall labs are reassuring.  I personally viewed x-ray and labs and they are unremarkable.  Patient been resting comfortably, no vomiting. It is reported that she has a history of chronic abdominal pain with multiple evaluation At this point I feel patient is safe for discharge home        Final Clinical Impression(s) / ED Diagnoses Final diagnoses:  Generalized abdominal pain    Rx / DC Orders ED Discharge Orders     None         Ripley Fraise, MD 09/25/21 [phone]

## 2021-09-26 ENCOUNTER — Other Ambulatory Visit: Payer: Self-pay

## 2021-09-26 ENCOUNTER — Ambulatory Visit (HOSPITAL_COMMUNITY)
Admission: RE | Admit: 2021-09-26 | Discharge: 2021-09-26 | Disposition: A | Discharge status: Home / Self Care | Payer: Medicare Other | Source: Ambulatory Visit | Attending: Gastroenterology | Admitting: Gastroenterology

## 2021-09-26 DIAGNOSIS — M545 Low back pain, unspecified: Secondary | ICD-10-CM | POA: Diagnosis not present

## 2021-09-26 DIAGNOSIS — K862 Cyst of pancreas: Secondary | ICD-10-CM | POA: Insufficient documentation

## 2021-09-26 DIAGNOSIS — I700 Atherosclerosis of aorta: Secondary | ICD-10-CM | POA: Diagnosis not present

## 2021-09-26 DIAGNOSIS — R109 Unspecified abdominal pain: Secondary | ICD-10-CM | POA: Diagnosis not present

## 2021-09-26 MED ORDER — IOHEXOL 300 MG/ML  SOLN
100.0000 mL | Freq: Once | INTRAMUSCULAR | Status: AC | PRN
  Administered 2021-09-26: 100 mL via INTRAVENOUS

## 2021-09-26 NOTE — Progress Notes (Signed)
Maria Murphy, [place] [address]   CLINIC:  Medical Oncology/Hematology  PCP:  Elsie Stain, MD [address] [id number] [phone]   REASON FOR VISIT:  Follow-up for normocytic anemia with history of bilateral pulmonary embolism   PRIOR THERAPY: Xarelto for PE; PRBC transfusion for GI bleed anemia   CURRENT THERAPY: Intermittent IV iron (Venofer x5 from 12/31/2020 through 01/17/2021), not on any current anticoagulation  INTERVAL HISTORY:  Ms. Kissick 66 y.o. female returns for routine follow-up of  her normocytic anemia with history of bilateral pulmonary embolism.  She was last seen by Tarri Abernethy PA-C on 05/23/2021.  At today's visit, she reports feeling fair.  She apparently tripped and fell 12 days ago and dislocated her right shoulder.  She also had some bruising to her ribs and hip.  She is recovering slowly at home, but is still having some general soreness and residual fatigue after her fall.  She denies any current signs or symptoms of blood loss, no gross rectal hemorrhage or melena. She has some chest wall pain secondary to fall 2 weeks ago, but denies any anginal type chest pain. She reports new dyspnea on exertion, as well as recurrent dizziness, lightheadedness, and headache.  She denies any syncope, pica, or restless leg symptoms. She continues to take iron supplementation at home. She denies any unilateral leg swelling, but does report that her bilateral peripheral edema is worse than usual, and that she is now waking up with swollen legs in the morning.  She continues to follow-up with gastroenterology (Dr. Rush Landmark) for her many GI issues.  She is eating better after esophageal dilation in December, but continues to have significant abdominal cramps after eating.  Her weight is stable, although she had previously lost about 50 pounds in a year due to her inability to tolerate adequate oral intake.  She  reports that her energy is 50% with 100% appetite.  She has been maintaining a stable weight at this time.   REVIEW OF SYSTEMS:  Review of Systems  Constitutional:  Positive for fatigue. Negative for appetite change, chills, diaphoresis, fever and unexpected weight change.  HENT:   Negative for lump/mass and nosebleeds.   Eyes:  Negative for eye problems.  Respiratory:  Positive for shortness of breath (with exertion). Negative for cough and hemoptysis.   Cardiovascular:  Positive for leg swelling and palpitations. Negative for chest pain.  Gastrointestinal:  Positive for abdominal pain and nausea. Negative for blood in stool, constipation, diarrhea and vomiting.  Genitourinary:  Negative for hematuria.   Skin: Negative.   Neurological:  Positive for headaches. Negative for dizziness and light-headedness.  Hematological:  Does not bruise/bleed easily.  Psychiatric/Behavioral:  Positive for sleep disturbance.      PAST MEDICAL/SURGICAL HISTORY:  Past Medical History:  Diagnosis Date   Acute saddle pulmonary embolism without acute cor pulmonale (HCC)    Basal cell carcinoma 01/16/2021   L nose, MOHs 04/14/21   Collagen vascular disease ([place])    Duodenal ulcer    Essential hypertension, benign 01/24/2015   Family history of ovarian cancer 01/06/2021   Family history of pancreatic cancer 01/06/2021   Family history of uterine cancer 01/06/2021   Foot ulcer ([place])    GERD (gastroesophageal reflux disease)    Hypertension    Memory loss    Peptic ulcer with perforation ([place])    Perforated abdominal viscus 04/22/2020   PUD (peptic ulcer disease)  with perforation s/p exploratory laparotomy   Pulmonary embolus ([facility]) 04/2020   Skin cancer    Suicidal ideations 08/07/2020   Vision abnormalities    Past Surgical History:  Procedure Laterality Date   BILIARY DILATION  08/21/2021   Procedure: DUODENAL DILATION;  Surgeon: Rush Landmark Telford Nab., MD;  Location: [facility];   Service: Gastroenterology;;   BIOPSY  11/20/2020   Procedure: BIOPSY;  Surgeon: Irving Copas., MD;  Location: [facility];  Service: Gastroenterology;;   BIOPSY  08/21/2021   Procedure: BIOPSY;  Surgeon: Irving Copas., MD;  Location: [facility];  Service: Gastroenterology;;   CENTRAL LINE INSERTION Right 04/22/2020   Procedure: CENTRAL LINE INSERTION;  Surgeon: Virl Cagey, MD;  Location: [facility];  Service: General;  Laterality: Right;   DIAGNOSTIC LAPAROSCOPY     ESOPHAGOGASTRODUODENOSCOPY N/A 11/20/2020   Procedure: ESOPHAGOGASTRODUODENOSCOPY (EGD);  Surgeon: Irving Copas., MD;  Location: [facility];  Service: Gastroenterology;  Laterality: N/A;   ESOPHAGOGASTRODUODENOSCOPY (EGD) WITH PROPOFOL N/A 07/10/2020   Procedure: ESOPHAGOGASTRODUODENOSCOPY (EGD) WITH PROPOFOL;  Surgeon: Rogene Houston, MD;  [facility];  Service: Endoscopy;  Laterality: N/A;   ESOPHAGOGASTRODUODENOSCOPY (EGD) WITH PROPOFOL N/A 10/18/2020   Procedure: ESOPHAGOGASTRODUODENOSCOPY (EGD) WITH PROPOFOL;  Surgeon: Harvel Quale, MD;  [facility];  Service: Gastroenterology;  Laterality: N/A;   ESOPHAGOGASTRODUODENOSCOPY (EGD) WITH PROPOFOL N/A 08/21/2021   Procedure: ESOPHAGOGASTRODUODENOSCOPY (EGD) WITH PROPOFOL;  Surgeon: Rush Landmark Telford Nab., MD;  Location: [facility];  Service: Gastroenterology;  Laterality: N/A;   EUS N/A 11/20/2020   Procedure: UPPER ENDOSCOPIC ULTRASOUND (EUS) LINEAR;  Surgeon: Irving Copas., MD;  Location: [facility];  Service: Gastroenterology;  Laterality: N/A;   EUS  08/21/2021   Procedure: UPPER ENDOSCOPIC ULTRASOUND (EUS) LINEAR;  Surgeon: Irving Copas., MD;  Location: [facility];  Service: Gastroenterology;;   FINE NEEDLE ASPIRATION  08/21/2021   Procedure: FINE NEEDLE ASPIRATION (FNA) LINEAR;  Surgeon: Irving Copas., MD;  Location: [facility];  Service: Gastroenterology;;    Toniann Ket  04/22/2020   Procedure: Toniann Ket;  Surgeon: Virl Cagey, MD;  [facility];  Service: General;;   IR CATHETER TUBE CHANGE  05/09/2020   LAPAROTOMY N/A 04/22/2020   Procedure: EXPLORATORY LAPAROTOMY;  Surgeon: Virl Cagey, MD;  [facility];  Service: General;  Laterality: N/A;   TONSILLECTOMY       SOCIAL HISTORY:  Social History   Socioeconomic History   Marital status: Widowed    Spouse name: Not on file   Number of children: 2   Years of education: Not on file   Highest education level: Not on file  Occupational History   Not on file  Tobacco Use   Smoking status: Never   Smokeless tobacco: Never  Vaping Use   Vaping Use: Never used  Substance and Sexual Activity   Alcohol use: Yes    Alcohol/week: 2.0 - 3.0 standard drinks    Types: 2 - 3 Glasses of wine per week   Drug use: No   Sexual activity: Not Currently    Birth control/protection: Post-menopausal  Other Topics Concern   Not on file  Social History Narrative   Not on file   Social Determinants of Health   Financial Resource Strain: Low Risk    Difficulty of Paying Living Expenses: Not hard at all  Food Insecurity: No Food Insecurity   Worried About Charity fundraiser in the Last Year: Never true   [employer]  in the Last Year: Never true  Transportation Needs: Unmet Transportation Needs   Lack of Transportation (Medical): Yes   Lack of Transportation (Non-Medical): No  Physical Activity: Insufficiently Active   Days of Exercise per Week: 7 days   Minutes of Exercise per Session: 10 min  Stress: Stress Concern Present   Feeling of Stress : Very much  Social Connections: Moderately Integrated   Frequency of Communication with Friends and Family: More than three times a week   Frequency of Social Gatherings with Friends and Family: More than three times a week   Attends Religious Services: More than 4 times per year   Active Member of [employer] or Organizations:  Yes   Attends Archivist Meetings: More than 4 times per year   Marital Status: Widowed  Human resources officer Violence: Not At Risk   Fear of Current or Ex-Partner: No   Emotionally Abused: No   Physically Abused: No   Sexually Abused: No    FAMILY HISTORY:  Family History  Problem Relation Age of Onset   High Cholesterol Mother    Hypertension Mother    Arthritis/Rheumatoid Mother    Pancreatic cancer Mother 49   Alcohol abuse Father    Hypertension Maternal Grandmother    Uterine cancer Maternal Grandmother 76   Ovarian cancer Cousin        before age 19   Colon cancer Neg Hx    Colon polyps Neg Hx    Inflammatory bowel disease Neg Hx     CURRENT MEDICATIONS:  Outpatient Encounter Medications as of 09/29/2021  Medication Sig   acetaminophen (TYLENOL) 500 MG tablet Take 1 tablet (500 mg total) by mouth every 6 (six) hours as needed for mild pain or fever.   amLODipine (NORVASC) 10 MG tablet Take 1 tablet (10 mg total) by mouth daily. For BP   Ascorbic Acid (VITAMIN C PO) Take 1 tablet by mouth daily.   citalopram (CELEXA) 40 MG tablet Take 1 tablet (40 mg total) by mouth at bedtime.   donepezil (ARICEPT) 10 MG tablet Take 1 tablet (10 mg total) by mouth in the morning and at bedtime.   DULoxetine HCl 40 MG CPEP Take 1 capsule by mouth daily.   ferrous sulfate 325 (65 FE) MG tablet Take 325 mg by mouth in the morning.   gabapentin (NEURONTIN) 300 MG capsule Take 1 capsule (300 mg total) by mouth 2 (two) times daily.   ibuprofen (ADVIL) 800 MG tablet Take 800 mg by mouth 3 (three) times daily.   lactose free nutrition (BOOST) LIQD Take 237 mLs by mouth 2 (two) times daily. Boost Breeze Clear   lansoprazole (PREVACID SOLUTAB) 30 MG disintegrating tablet Place 1 tablet on tongue twice daily and allow to dissolve until particles can be swallowed. Do not swallow whole, break, cut, or chew tablet.   lidocaine (LIDODERM) 5 % Place 1 patch onto the skin daily. Remove & Discard  patch within 12 hours or as directed by MD   loperamide (IMODIUM) 2 MG capsule Take 1 capsule (2 mg total) by mouth 2 (two) times daily as needed for diarrhea or loose stools.   predniSONE (STERAPRED UNI-PAK 48 TAB) 10 MG (48) TBPK tablet Take by mouth as directed.   promethazine (PHENERGAN) 12.5 MG tablet Take 1 tablet (12.5 mg total) by mouth every 8 (eight) hours as needed for nausea or vomiting.   QUEtiapine (SEROQUEL) 50 MG tablet Take 1 tablet (50 mg total) by mouth at bedtime.  sucralfate (CARAFATE) 1 GM/10ML suspension Take 10 mLs (1 g total) by mouth 4 (four) times daily -  with meals and at bedtime.   tiZANidine (ZANAFLEX) 4 MG tablet Take 1 tablet (4 mg total) by mouth every 8 (eight) hours as needed for muscle spasms.   traMADol (ULTRAM) 50 MG tablet Take 1 tablet (50 mg total) by mouth every 8 (eight) hours as needed.   traZODone (DESYREL) 100 MG tablet Take 1 tablet (100 mg total) by mouth at bedtime.   No facility-administered encounter medications on file as of 09/29/2021.    ALLERGIES:  Allergies  Allergen Reactions   Wheat Extract Swelling   Ketamine Other (See Comments)    Agitation, confusion, hypertension, tachycardia     PHYSICAL EXAM:  ECOG PERFORMANCE STATUS: 2 - Symptomatic, <50% confined to bed  There were no vitals filed for this visit. There were no vitals filed for this visit. Physical Exam Constitutional:      Appearance: Normal appearance.  HENT:     Head: Normocephalic and atraumatic.     Mouth/Throat:     Mouth: Mucous membranes are moist.  Eyes:     Extraocular Movements: Extraocular movements intact.     Pupils: Pupils are equal, round, and reactive to light.  Cardiovascular:     Rate and Rhythm: Normal rate and regular rhythm.     Pulses: Normal pulses.     Heart sounds: Normal heart sounds.  Pulmonary:     Effort: Pulmonary effort is normal.     Breath sounds: Normal breath sounds. Decreased air movement (diminished bases bilaterally)  present.  Abdominal:     General: Bowel sounds are normal.     Palpations: Abdomen is soft.     Tenderness: There is no abdominal tenderness.  Musculoskeletal:        General: No swelling.     Right lower leg: Edema (1+ ankle edema) present.     Left lower leg: Edema (1+ ankle edema) present.  Lymphadenopathy:     Cervical: No cervical adenopathy.  Skin:    General: Skin is warm and dry.  Neurological:     General: No focal deficit present.     Mental Status: She is alert and oriented to person, place, and time.  Psychiatric:        Mood and Affect: Mood normal.        Behavior: Behavior normal.     LABORATORY DATA:  I have reviewed the labs as listed.  CBC    Component Value Date/Time   WBC 10.3 09/24/2021 2350   RBC 4.12 09/24/2021 2350   HGB 12.7 09/24/2021 2350   HCT 37.3 09/24/2021 2350   HCT 33.7 (L) 12/20/2020 1302   PLT 211 09/24/2021 2350   MCV 90.5 09/24/2021 2350   MCH 30.8 09/24/2021 2350   MCHC 34.0 09/24/2021 2350   RDW 16.1 (H) 09/24/2021 2350   LYMPHSABS 1.5 09/22/2021 1027   MONOABS 0.6 09/22/2021 1027   EOSABS 0.1 09/22/2021 1027   BASOSABS 0.0 09/22/2021 1027   CMP Latest Ref Rng & Units 09/24/2021 09/06/2021 05/13/2021  Glucose 70 - 99 mg/dL 156(H) 64(L) 121(H)  BUN 8 - 23 mg/dL 14 18 11   Creatinine 0.44 - 1.00 mg/dL 0.64 1.21(H) 0.84  Sodium 135 - 145 mmol/L 135 138 136  Potassium 3.5 - 5.1 mmol/L 4.4 3.8 3.2(L)  Chloride 98 - 111 mmol/L 109 105 98  CO2 22 - 32 mmol/L 22 23 24   Calcium 8.9 - 10.3 mg/dL  8.6(L) 8.1(L) 9.6  Total Protein 6.5 - 8.1 g/dL 6.7 6.5 7.9  Total Bilirubin 0.3 - 1.2 mg/dL 0.5 0.2(L) 0.8  Alkaline Phos 38 - 126 U/L 44 54 58  AST 15 - 41 U/L 16 25 18   ALT 0 - 44 U/L 8 10 8     DIAGNOSTIC IMAGING:  I have independently reviewed the relevant imaging and discussed with the patient.  ASSESSMENT & PLAN: 1.  History of bilateral pulmonary embolism - Bilateral PE with right heart strain in September 2021, which occurred  during ulcer repair which suggests likely provoked episode - Patient was on Xarelto for close to 6 months, then had GI bleeding - hence anticoagulation was held and hypercoagulable work-up was ordered - Hypercoagulable work-up in March 2022 was negative - Most recent CT with PE protocol was negative - This was single episode of likely provoked PE, although it was high burden PE causing right heart strain; she has GI ulcers that intermittently bleeding; therefore, do not recommend long-term anticoagulation at this time - No current signs or symptoms of DVT or PE  - PLAN: No intervention or anticoagulation recommended at this time.  If patient has another episode of DVT/PE in the future, we will discuss with her again any further recommendations or changes to the above plan   2.  Iron deficiency anemia - Patient had GI bleed in March 2022 while on anticoagulation for pulmonary embolism, received PRBC transfusion x1 - She has longstanding history of peptic ulcers that intermittently bleed - EGD 10/18/2020 showed erosive gastropathy with gastric ulcer and duodenal ulcer without any current stigmata of bleeding - EGD 08/21/2021: Friable gastric mucosa, gastritis with patchy mild inflammation characterized by erosions, erythema, and friability; duodenal scar with healing ulcer improved but still with active inflammation noted - Work-up for anemia initiated at last visit in April 2022 showed Hgb 10.6, ferritin 5, iron saturation 6%; vitamin B12, folate, LDH, SPEP/light chains normal - Suspect that she had persistent anemia and iron deficiency secondary to GI bleed in March 2022 - She continues to follow with GI (Dr. Rush Landmark) - She received IV iron with Venofer x5 from 12/31/2020 through 01/17/2021 - She continues to take oral ferrous sulfate 325 mg daily at home.     - She denies any current signs or symptoms of gross rectal hemorrhage or melena, has mildly dark stool secondary to oral iron  supplementation    - She is symptomatic with fatigue, dyspnea on exertion, lightheadedness, headache - Most recent labs (09/22/2021): Hgb 12.8/MCV 90.5, ferritin 18, iron saturation 16%, TIBC 451 - PLAN: Recommend IV iron with Feraheme x2  - Continue ferrous sulfate daily at home. - Repeat CBC and iron panel in 4 months with office visit. - Continue GI follow-up for intermittent GI bleeding and peptic ulcer disease   3.  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 Charlynne Cousins, we dont have any information that she has pancreatic cancer at this time. - Patient's mother had pancreatic cancer at age 26 - She was referred for genetic counseling (see note from genetic counselor dated 01/15/2021) - PLAN: Continue follow-up as above   4.  Palpitations and dyspnea on exertion - Patient reports that she feels her heart go "out of rhythm" - She reports increased dyspnea on exertion and bilateral lower extremity edema - Breath sounds markedly diminished at bases on exam -Referral was previously sent to cardiologist, but she  never heard back from them - PLAN: Patient encouraged to follow-up with PCP for work-up of the above symptoms and for possible referral to cardiology. - Patient educated on alarm symptoms that would prompt immediate medical attention at the ED.   PLAN SUMMARY & DISPOSITION: IV Feraheme x2 Labs and office visit in 4 months  All questions were answered. The patient knows to call the clinic with any problems, questions or concerns.  Medical decision making: Moderate  Time spent on visit: I spent 20 minutes counseling the patient face to face. The total time spent in the appointment was 30 minutes and more than 50% was on counseling.   Harriett Rush, PA-C  09/29/21 2:31 PM

## 2021-09-29 ENCOUNTER — Inpatient Hospital Stay (HOSPITAL_COMMUNITY): Payer: Medicare Other | Attending: Hematology | Admitting: Physician Assistant

## 2021-09-29 ENCOUNTER — Other Ambulatory Visit: Payer: Self-pay

## 2021-09-29 VITALS — BP 143/91 | HR 83 | Temp 97.6°F | Resp 18 | Ht 64.0 in | Wt 175.7 lb

## 2021-09-29 DIAGNOSIS — R0602 Shortness of breath: Secondary | ICD-10-CM | POA: Diagnosis not present

## 2021-09-29 DIAGNOSIS — I2699 Other pulmonary embolism without acute cor pulmonale: Secondary | ICD-10-CM | POA: Diagnosis not present

## 2021-09-29 DIAGNOSIS — Z79899 Other long term (current) drug therapy: Secondary | ICD-10-CM | POA: Diagnosis not present

## 2021-09-29 DIAGNOSIS — K219 Gastro-esophageal reflux disease without esophagitis: Secondary | ICD-10-CM | POA: Insufficient documentation

## 2021-09-29 DIAGNOSIS — Z800 Family history of malignant neoplasm of digestive organs: Secondary | ICD-10-CM | POA: Insufficient documentation

## 2021-09-29 DIAGNOSIS — Z8041 Family history of malignant neoplasm of ovary: Secondary | ICD-10-CM | POA: Diagnosis not present

## 2021-09-29 DIAGNOSIS — Z7901 Long term (current) use of anticoagulants: Secondary | ICD-10-CM | POA: Insufficient documentation

## 2021-09-29 DIAGNOSIS — D500 Iron deficiency anemia secondary to blood loss (chronic): Secondary | ICD-10-CM | POA: Diagnosis not present

## 2021-09-29 DIAGNOSIS — Z86711 Personal history of pulmonary embolism: Secondary | ICD-10-CM | POA: Diagnosis not present

## 2021-09-29 DIAGNOSIS — I10 Essential (primary) hypertension: Secondary | ICD-10-CM | POA: Diagnosis not present

## 2021-09-29 DIAGNOSIS — K862 Cyst of pancreas: Secondary | ICD-10-CM | POA: Insufficient documentation

## 2021-09-29 NOTE — Patient Instructions (Signed)
[place] at [facility] Discharge Instructions  You were seen today by Tarri Abernethy PA-C for your iron deficiency anemia and your history of blood clots.  Your blood levels are normal, but your iron level is low. - We will schedule you for IV iron x2 doses. - It is possible that you may still be having some slow bleeding from your stomach or intestines.  Make sure that you continue to follow-up with your GI doctor and notify them if you have any blood in your stool or black bowel movements. - Continue taking your iron pill at home once a day.  As we discussed, you do not need any ongoing anticoagulation (blood thinner) for your history of blood clots.  However, if you have any blood clots in the future, please let us know immediately, as we may need to put you on indefinite anticoagulation at that time.  ** Make sure that you discuss with your primary care provider regarding your palpitations, shortness of breath with exertion, and leg swelling.  These may be signs that you have some underlying heart problems, and your primary care provider may need to refer you to a cardiologist as well.  LABS: Return in 4 months for repeat labs  OTHER TESTS: None at this time  TREATMENT: - IV iron x2 doses - Continue to take iron pill once daily.    FOLLOW-UP APPOINTMENT: Office visit in 4 months, after labs   Thank you for choosing [place] at [facility] to provide your oncology and hematology care.  To afford each patient quality time with our provider, please arrive at least 15 minutes before your scheduled appointment time.   If you have a lab appointment with the [facility] please come in thru the Main Entrance and check in at the main information desk.  You need to re-schedule your appointment should you arrive 10 or more minutes late.  We strive to give you quality time with our providers, and arriving late affects you and other  patients whose appointments are after yours.  Also, if you no show three or more times for appointments you may be dismissed from the clinic at the providers discretion.     Again, thank you for choosing [facility].  Our hope is that these requests will decrease the amount of time that you wait before being seen by our physicians.       _____________________________________________________________  Should you have questions after your visit to [facility], please contact our office at [phone] and follow the prompts.  Our office hours are 8:00 a.m. and 4:30 p.m. Monday - Friday.  Please note that voicemails left after 4:00 p.m. may not be returned until the following business day.  We are closed weekends and major holidays.  You do have access to a nurse 24-7, just call the main number to the clinic [phone] and do not press any options, hold on the line and a nurse will answer the phone.    For prescription refill requests, have your pharmacy contact our office and allow 72 hours.    Due to Covid, you will need to wear a mask upon entering the hospital. If you do not have a mask, a mask will be given to you at the Main Entrance upon arrival. For doctor visits, patients may have 1 support person age 66 or older with them. For treatment visits, patients can not have anyone with them  due to social distancing guidelines and our immunocompromised population.

## 2021-10-02 ENCOUNTER — Other Ambulatory Visit: Payer: Self-pay

## 2021-10-02 ENCOUNTER — Inpatient Hospital Stay (HOSPITAL_COMMUNITY): Payer: Medicare Other

## 2021-10-02 VITALS — BP 133/79 | HR 62 | Temp 97.2°F | Resp 18

## 2021-10-02 DIAGNOSIS — R0602 Shortness of breath: Secondary | ICD-10-CM | POA: Diagnosis not present

## 2021-10-02 DIAGNOSIS — Z86711 Personal history of pulmonary embolism: Secondary | ICD-10-CM | POA: Diagnosis not present

## 2021-10-02 DIAGNOSIS — Z79899 Other long term (current) drug therapy: Secondary | ICD-10-CM | POA: Diagnosis not present

## 2021-10-02 DIAGNOSIS — I10 Essential (primary) hypertension: Secondary | ICD-10-CM | POA: Diagnosis not present

## 2021-10-02 DIAGNOSIS — D500 Iron deficiency anemia secondary to blood loss (chronic): Secondary | ICD-10-CM | POA: Diagnosis not present

## 2021-10-02 DIAGNOSIS — Z800 Family history of malignant neoplasm of digestive organs: Secondary | ICD-10-CM | POA: Diagnosis not present

## 2021-10-02 DIAGNOSIS — K862 Cyst of pancreas: Secondary | ICD-10-CM | POA: Diagnosis not present

## 2021-10-02 DIAGNOSIS — Z7901 Long term (current) use of anticoagulants: Secondary | ICD-10-CM | POA: Diagnosis not present

## 2021-10-02 DIAGNOSIS — K219 Gastro-esophageal reflux disease without esophagitis: Secondary | ICD-10-CM | POA: Diagnosis not present

## 2021-10-02 MED ORDER — ACETAMINOPHEN 325 MG PO TABS
650.0000 mg | ORAL_TABLET | Freq: Once | ORAL | Status: AC
  Administered 2021-10-02: 650 mg via ORAL
  Filled 2021-10-02: qty 2

## 2021-10-02 MED ORDER — LORATADINE 10 MG PO TABS
10.0000 mg | ORAL_TABLET | Freq: Once | ORAL | Status: AC
  Administered 2021-10-02: 10 mg via ORAL
  Filled 2021-10-02: qty 1

## 2021-10-02 MED ORDER — SODIUM CHLORIDE 0.9 % IV SOLN
510.0000 mg | Freq: Once | INTRAVENOUS | Status: AC
  Administered 2021-10-02: 510 mg via INTRAVENOUS
  Filled 2021-10-02: qty 510

## 2021-10-02 MED ORDER — SODIUM CHLORIDE 0.9 % IV SOLN: Freq: Once | INTRAVENOUS | Status: AC

## 2021-10-02 NOTE — Progress Notes (Signed)
Patient presents today for Feraheme infusion per providers order.  Vital signs WNL.  Patient has no new complaints at this time.    Peripheral IV started and blood return noted pre and post infusion.  Feraheme infusion  given today per MD orders.  Stable during infusion without adverse affects.  Vital signs stable.  No complaints at this time.  Discharge from clinic ambulatory in stable condition.  Alert and oriented X 3.  Follow up with [facility] as scheduled.  

## 2021-10-02 NOTE — Patient Instructions (Signed)
[facility]  Discharge Instructions: °Thank you for choosing [facility] to provide your oncology and hematology care.  °If you have a lab appointment with the Cancer Center, please come in thru the Main Entrance and check in at the main information desk. ° °Wear comfortable clothing and clothing appropriate for easy access to any Portacath or PICC line.  ° °We strive to give you quality time with your provider. You may need to reschedule your appointment if you arrive late (15 or more minutes).  Arriving late affects you and other patients whose appointments are after yours.  Also, if you miss three or more appointments without notifying the office, you may be dismissed from the clinic at the provider’s discretion.    °  °For prescription refill requests, have your pharmacy contact our office and allow 72 hours for refills to be completed.   ° °Today you received the following chemotherapy and/or immunotherapy agents Feraheme    °  °To help prevent nausea and vomiting after your treatment, we encourage you to take your nausea medication as directed. ° °BELOW ARE SYMPTOMS THAT SHOULD BE REPORTED IMMEDIATELY: °*FEVER GREATER THAN 100.4 F (38 °C) OR HIGHER °*CHILLS OR SWEATING °*NAUSEA AND VOMITING THAT IS NOT CONTROLLED WITH YOUR NAUSEA MEDICATION °*UNUSUAL SHORTNESS OF BREATH °*UNUSUAL BRUISING OR BLEEDING °*URINARY PROBLEMS (pain or burning when urinating, or frequent urination) °*BOWEL PROBLEMS (unusual diarrhea, constipation, pain near the anus) °TENDERNESS IN MOUTH AND THROAT WITH OR WITHOUT PRESENCE OF ULCERS (sore throat, sores in mouth, or a toothache) °UNUSUAL RASH, SWELLING OR PAIN  °UNUSUAL VAGINAL DISCHARGE OR ITCHING  ° °Items with * indicate a potential emergency and should be followed up as soon as possible or go to the Emergency Department if any problems should occur. ° °Please show the CHEMOTHERAPY ALERT CARD or IMMUNOTHERAPY ALERT CARD at check-in to the Emergency  Department and triage nurse. ° °Should you have questions after your visit or need to cancel or reschedule your appointment, please contact [facility] [phone]  and follow the prompts.  Office hours are 8:00 a.m. to 4:30 p.m. Monday - Friday. Please note that voicemails left after 4:00 p.m. may not be returned until the following business day.  We are closed weekends and major holidays. You have access to a nurse at all times for urgent questions. Please call the main number to the clinic [phone] and follow the prompts. ° °For any non-urgent questions, you may also contact your provider using MyChart. We now offer e-Visits for anyone 18 and older to request care online for non-urgent symptoms. For details visit [email or web address]. °  °Also download the MyChart app! Go to the app store, search "MyChart", open the app, select [facility], and log in with your MyChart username and password. ° °Due to Covid, a mask is required upon entering the hospital/clinic. If you do not have a mask, one will be given to you upon arrival. For doctor visits, patients may have 1 support person aged 18 or older with them. For treatment visits, patients cannot have anyone with them due to current Covid guidelines and our immunocompromised population.  °

## 2021-10-03 ENCOUNTER — Ambulatory Visit (HOSPITAL_COMMUNITY): Payer: Medicare Other

## 2021-10-03 DIAGNOSIS — M25561 Pain in right knee: Secondary | ICD-10-CM | POA: Diagnosis not present

## 2021-10-03 DIAGNOSIS — M25562 Pain in left knee: Secondary | ICD-10-CM | POA: Diagnosis not present

## 2021-10-07 ENCOUNTER — Telehealth: Payer: Self-pay | Admitting: Gastroenterology

## 2021-10-07 NOTE — Telephone Encounter (Signed)
The pt has a pancreatic cyst and wants to discuss with Dr Rush Landmark.  She forgot that she has an appt tomorrow with Dr Rush Landmark at 850 am.  She will keep the appt and discuss at that time.

## 2021-10-07 NOTE — Telephone Encounter (Signed)
Inbound call from patient stated that she is having abd pain and pain below her chest seems like every 5 mins. Patient is scheduled to see Dr. Rush Landmark 2/15. Please advise.

## 2021-10-08 ENCOUNTER — Ambulatory Visit (INDEPENDENT_AMBULATORY_CARE_PROVIDER_SITE_OTHER): Payer: Medicare Other | Admitting: Gastroenterology

## 2021-10-08 ENCOUNTER — Encounter: Payer: Self-pay | Admitting: Gastroenterology

## 2021-10-08 ENCOUNTER — Other Ambulatory Visit (INDEPENDENT_AMBULATORY_CARE_PROVIDER_SITE_OTHER): Payer: Medicare Other

## 2021-10-08 VITALS — BP 144/82 | HR 59 | Ht 64.0 in | Wt 174.0 lb

## 2021-10-08 DIAGNOSIS — Z8639 Personal history of other endocrine, nutritional and metabolic disease: Secondary | ICD-10-CM

## 2021-10-08 DIAGNOSIS — R509 Fever, unspecified: Secondary | ICD-10-CM | POA: Insufficient documentation

## 2021-10-08 DIAGNOSIS — K862 Cyst of pancreas: Secondary | ICD-10-CM

## 2021-10-08 DIAGNOSIS — K2270 Barrett's esophagus without dysplasia: Secondary | ICD-10-CM | POA: Insufficient documentation

## 2021-10-08 DIAGNOSIS — G8929 Other chronic pain: Secondary | ICD-10-CM

## 2021-10-08 DIAGNOSIS — R1013 Epigastric pain: Secondary | ICD-10-CM

## 2021-10-08 DIAGNOSIS — R159 Full incontinence of feces: Secondary | ICD-10-CM | POA: Insufficient documentation

## 2021-10-08 DIAGNOSIS — R1114 Bilious vomiting: Secondary | ICD-10-CM

## 2021-10-08 DIAGNOSIS — K279 Peptic ulcer, site unspecified, unspecified as acute or chronic, without hemorrhage or perforation: Secondary | ICD-10-CM | POA: Diagnosis not present

## 2021-10-08 DIAGNOSIS — K529 Noninfective gastroenteritis and colitis, unspecified: Secondary | ICD-10-CM

## 2021-10-08 DIAGNOSIS — R112 Nausea with vomiting, unspecified: Secondary | ICD-10-CM | POA: Insufficient documentation

## 2021-10-08 DIAGNOSIS — K315 Obstruction of duodenum: Secondary | ICD-10-CM | POA: Diagnosis not present

## 2021-10-08 LAB — HEMOGLOBIN A1C: Hgb A1c MFr Bld: 5.5 % (ref 4.6–6.5)

## 2021-10-08 LAB — CORTISOL: Cortisol, Plasma: 2.8 ug/dL

## 2021-10-08 LAB — CBC
HCT: 36.3 % (ref 36.0–46.0)
Hemoglobin: 12.3 g/dL (ref 12.0–15.0)
MCHC: 34 g/dL (ref 30.0–36.0)
MCV: 90.2 fl (ref 78.0–100.0)
Platelets: 187 10*3/uL (ref 150.0–400.0)
RBC: 4.03 Mil/uL (ref 3.87–5.11)
RDW: 16.3 % — ABNORMAL HIGH (ref 11.5–15.5)
WBC: 6.8 10*3/uL (ref 4.0–10.5)

## 2021-10-08 LAB — C-REACTIVE PROTEIN: CRP: <1 mg/dL (ref 0.5–20.0)

## 2021-10-08 LAB — PROTIME-INR
INR: 1 ratio (ref 0.8–1.0)
Prothrombin Time: 11 s (ref 9.6–13.1)

## 2021-10-08 LAB — SEDIMENTATION RATE: Sed Rate: 10 mm/hr (ref 0–30)

## 2021-10-08 LAB — LIPASE: Lipase: 26 U/L (ref 11.0–59.0)

## 2021-10-08 LAB — TSH: TSH: 1.31 u[IU]/mL (ref 0.35–5.50)

## 2021-10-08 MED ORDER — SUPREP BOWEL PREP KIT 17.5-3.13-1.6 GM/177ML PO SOLN: 1.0000 | ORAL | 0 refills | Status: DC

## 2021-10-08 NOTE — Patient Instructions (Signed)
We have sent the following medications to your pharmacy for you to pick up at your convenience: [place] provider has requested that you go to the basement level for lab work before leaving today. Press "B" on the elevator. The lab is located at the first door on the left as you exit the elevator.   You have been scheduled for an endoscopy and colonoscopy. Please follow the written instructions given to you at your visit today. Please pick up your prep supplies at the pharmacy within the next 1-3 days. If you use inhalers (even only as needed), please bring them with you on the day of your procedure.  Please contact your Primary Physician regarding recurrent fevers.   Minimize the use of NSAIDS- Ibuprofen and Naproxen.   Try to use Tylenol -1000mg  2-3 times daily for fever. ) no more than 3000mg  in 24 hr period.)  If you are age 54 or older, your body mass index should be between 23-30. Your Body mass index is 29.87 kg/m. If this is out of the aforementioned range listed, please consider follow up with your Primary Care Provider.  ________________________________________________________  The [facility] providers would like to encourage you to use [facility] to communicate with providers for non-urgent requests or questions.  Due to long hold times on the telephone, sending your provider a message by [facility] may be a faster and more efficient way to get a response.  Please allow 48 business hours for a response.  Please remember that this is for non-urgent requests.   Thank you for choosing me and [facility].  Dr. Rush Landmark

## 2021-10-08 NOTE — Progress Notes (Addendum)
[place] VISIT   Primary Care Provider Elsie Stain, MD [address] [place] [address] [phone]   Patient Profile: Maria Murphy is a 66 y.o. 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  The patient presents to the [facility] for an evaluation and management of problem(s) noted below:  Problem List 1. Pancreatic cyst   2. Abdominal pain, chronic, epigastric   3. Duodenal stenosis   4. Peptic ulcer disease   5. Barrett's esophagus without dysplasia   6. History of iron deficiency   7. Chronic diarrhea   8. Incontinence of feces, unspecified fecal incontinence type   9. Bilious vomiting with nausea   10. Fever, unspecified fever cause     History of Present Illness This is a patient who was previously been evaluated in the [place] office.  She is transitioning her care to [place].  She is a patient who has had significant ulcer disease in the past leading to perforation requiring patching.  She had a significant chronic ulcer that persisted over the course of the last few years.  She developed a duodenal stenosis as a result of this.  She also had a pancreatic cyst of an unclear etiology that was noted.  We could not initially perform an EUS due to the significant chronic ulceration but on most recent EUS attempt, we were able to see the cyst and take fluid.  Cyst fluid analysis was documented in the chart but did not suggest IPMN or MCN.  There was concern about potential bilious fluid within that collection, but a bile could never be added to the fluid specimen even after multiple tries.  Repeat  imaging was performed that shows a pancreas cyst had redeveloped.  Today, the patient states that after her EUS in December with cyst aspiration as well as duodenal stricture dilation that she was doing better for quite a few weeks.  She was eating more.  Food was not feeling as if it was staying in the stomach too long.  However she still had days where she did have her chronic abdominal pain.  Thanks progressed in the course the last 48 hours where she had more significant nausea and vomiting.  Vomiting did help with her symptoms.  The patient also tells me that she has had chronic diarrhea for the last 2 years as well as significant fecal urgency.  She has had episodes of fecal incontinence as well.  She states that she had a previous Cologuard done within the last 2 to 3 years and that it was negative.  She chose that method of colon cancer screening because she wanted to minimize colonoscopy if possible but this predated the symptoms of her chronic diarrhea.  About a year ago she did have an episode of bright red blood per rectum with her diarrhea but that has not recurred.  There is no family history of colorectal cancer.  Patient takes Imodium on a relatively frequent basis due to her diarrhea and it does slow things down to allow her to be functional but she is always worried about incontinence occurring or not getting to the restroom quickly enough.  Within the last month she was in an accident and had further worsening  of her right shoulder rotator cuff.  She was actually prescribed ibuprofen and naproxen.  Over the course the last few weeks she has been taking naproxen also to help with fevers of unclear etiology as well as pain and she believes that since the initiation of the naproxen her abdominal pain had worsened and she did not know that naproxen was in NSAID which she knew she should not be taking.  The patient is receiving further IV iron infusions with her next 1 later this week.  She does  have a history of iron deficiency anemia of unclear etiology but she is followed by the hematology group.  GI Review of Systems Positive as above Negative for pyrosis (it is medically controlled with lansoprazole), dysphagia, odynophagia, melena, hematochezia  Review of Systems General: Denies fevers/chills/weight loss unintentionally HEENT: Denies oral lesions/sore throat Cardiovascular: Denies chest pain Pulmonary: Denies shortness of breath Gastroenterological: See HPI Genitourinary: Denies darkened urine Hematological: Denies easy bruising/bleeding Endocrine: Denies temperature intolerance Dermatological: Denies jaundice Psychological: Mood is stable though she hopes to feel better   Medications Current Outpatient Medications  Medication Sig Dispense Refill   acetaminophen (TYLENOL) 500 MG tablet Take 1 tablet (500 mg total) by mouth every 6 (six) hours as needed for mild pain or fever.     amLODipine (NORVASC) 10 MG tablet Take 1 tablet (10 mg total) by mouth daily. For BP 90 tablet 3   Ascorbic Acid (VITAMIN C PO) Take 1 tablet by mouth daily.     citalopram (CELEXA) 40 MG tablet Take 1 tablet (40 mg total) by mouth at bedtime. 90 tablet 2   donepezil (ARICEPT) 10 MG tablet Take 1 tablet (10 mg total) by mouth in the morning and at bedtime. 180 tablet 0   DULoxetine HCl 40 MG CPEP Take 1 capsule by mouth daily. 60 capsule 4   ferrous sulfate 325 (65 FE) MG tablet Take 325 mg by mouth in the morning.     gabapentin (NEURONTIN) 300 MG capsule Take 1 capsule (300 mg total) by mouth 2 (two) times daily. 60 capsule 6   ibuprofen (ADVIL) 800 MG tablet Take 800 mg by mouth 3 (three) times daily.     lactose free nutrition (BOOST) LIQD Take 237 mLs by mouth 2 (two) times daily. Boost Breeze Clear     lansoprazole (PREVACID SOLUTAB) 30 MG disintegrating tablet Place 1 tablet on tongue twice daily and allow to dissolve until particles can be swallowed. Do not swallow whole, break, cut, or  chew tablet. 60 tablet 3   lidocaine (LIDODERM) 5 % Place 1 patch onto the skin daily. Remove & Discard patch within 12 hours or as directed by MD 30 patch 4   loperamide (IMODIUM) 2 MG capsule Take 1 capsule (2 mg total) by mouth 2 (two) times daily as needed for diarrhea or loose stools. 14 capsule 0   predniSONE (STERAPRED UNI-PAK 48 TAB) 10 MG (48) TBPK tablet Take by mouth as directed.     promethazine (PHENERGAN) 12.5 MG tablet Take 1 tablet (12.5 mg total) by mouth every 8 (eight) hours as needed for nausea or vomiting. 20 tablet 0   QUEtiapine (SEROQUEL) 50 MG tablet Take 1 tablet (50 mg total) by mouth at bedtime. 90 tablet 1   sucralfate (CARAFATE) 1 GM/10ML suspension Take 10 mLs (1 g total) by mouth 4 (four) times daily -  with meals and at bedtime. 420 mL 2   SUPREP BOWEL PREP KIT 17.5-3.13-1.6 GM/177ML SOLN Take 1 kit  by mouth as directed. For colonoscopy prep 354 mL 0   tiZANidine (ZANAFLEX) 4 MG tablet Take 1 tablet (4 mg total) by mouth every 8 (eight) hours as needed for muscle spasms. 30 tablet 0   traMADol (ULTRAM) 50 MG tablet Take 1 tablet (50 mg total) by mouth every 8 (eight) hours as needed. 60 tablet 0   traZODone (DESYREL) 100 MG tablet Take 1 tablet (100 mg total) by mouth at bedtime. 30 tablet 1   No current facility-administered medications for this visit.    Allergies Allergies  Allergen Reactions   Wheat Extract Swelling   Ketamine Other (See Comments)    Agitation, confusion, hypertension, tachycardia    Histories Past Medical History:  Diagnosis Date   Acute saddle pulmonary embolism without acute cor pulmonale (HCC)    Basal cell carcinoma 01/16/2021   L nose, MOHs 04/14/21   Collagen vascular disease (HCC)    Duodenal ulcer    Essential hypertension, benign 01/24/2015   Family history of ovarian cancer 01/06/2021   Family history of pancreatic cancer 01/06/2021   Family history of uterine cancer 01/06/2021   Foot ulcer (HCC)    GERD  (gastroesophageal reflux disease)    Hypertension    Memory loss    Peptic ulcer with perforation (HCC)    Perforated abdominal viscus 04/22/2020   PUD (peptic ulcer disease)    with perforation s/p exploratory laparotomy   Pulmonary embolus ([place]) 04/2020   Skin cancer    Suicidal ideations 08/07/2020   Vision abnormalities    Past Surgical History:  Procedure Laterality Date   BILIARY DILATION  08/21/2021   Procedure: DUODENAL DILATION;  Surgeon: Irving Copas., MD;  Location: [place];  Service: Gastroenterology;;   BIOPSY  11/20/2020   Procedure: BIOPSY;  Surgeon: Irving Copas., MD;  Location: [facility];  Service: Gastroenterology;;   BIOPSY  08/21/2021   Procedure: BIOPSY;  Surgeon: Irving Copas., MD;  Location: [place];  Service: Gastroenterology;;   CENTRAL LINE INSERTION Right 04/22/2020   Procedure: CENTRAL LINE INSERTION;  Surgeon: Virl Cagey, MD;  Location: [facility];  Service: General;  Laterality: Right;   DIAGNOSTIC LAPAROSCOPY     ESOPHAGOGASTRODUODENOSCOPY N/A 11/20/2020   Procedure: ESOPHAGOGASTRODUODENOSCOPY (EGD);  Surgeon: Irving Copas., MD;  Location: [facility];  Service: Gastroenterology;  Laterality: N/A;   ESOPHAGOGASTRODUODENOSCOPY (EGD) WITH PROPOFOL N/A 07/10/2020   Procedure: ESOPHAGOGASTRODUODENOSCOPY (EGD) WITH PROPOFOL;  Surgeon: Rogene Houston, MD;  [facility];  Service: Endoscopy;  Laterality: N/A;   ESOPHAGOGASTRODUODENOSCOPY (EGD) WITH PROPOFOL N/A 10/18/2020   Procedure: ESOPHAGOGASTRODUODENOSCOPY (EGD) WITH PROPOFOL;  Surgeon: Harvel Quale, MD;  [facility];  Service: Gastroenterology;  Laterality: N/A;   ESOPHAGOGASTRODUODENOSCOPY (EGD) WITH PROPOFOL N/A 08/21/2021   Procedure: ESOPHAGOGASTRODUODENOSCOPY (EGD) WITH PROPOFOL;  Surgeon: Rush Landmark Telford Nab., MD;  Location: [place];  Service: Gastroenterology;  Laterality: N/A;   EUS N/A 11/20/2020    Procedure: UPPER ENDOSCOPIC ULTRASOUND (EUS) LINEAR;  Surgeon: Irving Copas., MD;  Location: [facility];  Service: Gastroenterology;  Laterality: N/A;   EUS  08/21/2021   Procedure: UPPER ENDOSCOPIC ULTRASOUND (EUS) LINEAR;  Surgeon: Irving Copas., MD;  Location: [place];  Service: Gastroenterology;;   FINE NEEDLE ASPIRATION  08/21/2021   Procedure: FINE NEEDLE ASPIRATION (FNA) LINEAR;  Surgeon: Irving Copas., MD;  Location: [place];  Service: Gastroenterology;;   Toniann Ket  04/22/2020   Procedure: Toniann Ket;  Surgeon: Virl Cagey, MD;  Location: [facility];  Service: General;;   IR CATHETER TUBE CHANGE  05/09/2020   LAPAROTOMY N/A 04/22/2020   Procedure: EXPLORATORY LAPAROTOMY;  Surgeon: Virl Cagey, MD;  Location: [facility];  Service: General;  Laterality: N/A;   TONSILLECTOMY     Social History   Socioeconomic History   Marital status: Widowed    Spouse name: Not on file   Number of children: 2   Years of education: Not on file   Highest education level: Not on file  Occupational History   Not on file  Tobacco Use   Smoking status: Never   Smokeless tobacco: Never  Vaping Use   Vaping Use: Never used  Substance and Sexual Activity   Alcohol use: Yes    Alcohol/week: 2.0 - 3.0 standard drinks    Types: 2 - 3 Glasses of wine per week   Drug use: No   Sexual activity: Not Currently    Birth control/protection: Post-menopausal  Other Topics Concern   Not on file  Social History Narrative   Not on file   Social Determinants of Health   Financial Resource Strain: Low Risk    Difficulty of Paying Living Expenses: Not hard at all  Food Insecurity: No Food Insecurity   Worried About Charity fundraiser in the Last Year: Never true   Ran Out of Food in the Last Year: Never true  Transportation Needs: Unmet Transportation Needs   Lack of Transportation (Medical): Yes   Lack of Transportation (Non-Medical): No  Physical  Activity: Insufficiently Active   Days of Exercise per Week: 7 days   Minutes of Exercise per Session: 10 min  Stress: Stress Concern Present   Feeling of Stress : Very much  Social Connections: Moderately Integrated   Frequency of Communication with Friends and Family: More than three times a week   Frequency of Social Gatherings with Friends and Family: More than three times a week   Attends Religious Services: More than 4 times per year   Active Member of [employer] or Organizations: Yes   Attends Archivist Meetings: More than 4 times per year   Marital Status: Widowed  Human resources officer Violence: Not At Risk   Fear of Current or Ex-Partner: No   Emotionally Abused: No   Physically Abused: No   Sexually Abused: No   Family History  Problem Relation Age of Onset   High Cholesterol Mother    Hypertension Mother    Arthritis/Rheumatoid Mother    Pancreatic cancer Mother 54   Alcohol abuse Father    Hypertension Maternal Grandmother    Uterine cancer Maternal Grandmother 9   Ovarian cancer Cousin        before age 74   Colon cancer Neg Hx    Colon polyps Neg Hx    Inflammatory bowel disease Neg Hx    Esophageal cancer Neg Hx    Liver disease Neg Hx    Rectal cancer Neg Hx    Stomach cancer Neg Hx    I have reviewed her medical, social, and family history in detail and updated the electronic medical record as necessary.    PHYSICAL EXAMINATION  BP (!) 144/82    Pulse (!) 59    Ht $R'5\' 4"'Az$  (1.626 m)    Wt 174 lb (78.9 kg)    BMI 29.87 kg/m  Wt Readings from Last 3 Encounters:  10/08/21 174 lb (78.9 kg)  09/29/21 175 lb 11.3 oz (79.7 kg)  09/24/21 175 lb 14.8 oz (79.8  kg)  GEN: NAD, appears stated age, doesn't appear chronically ill PSYCH: Cooperative, without pressured speech EYE: Conjunctivae pink, sclerae anicteric ENT: MMM CV: Nontachycardic RESP: CTAB posteriorly GI: NABS, soft, protuberant abdomen, rounded, minimal tenderness to palpation in the  midepigastrium and right upper quadrant, no rebound or guarding  MSK/EXT: Trace bilateral lower extremity edema SKIN: No jaundice NEURO:  Alert & Oriented x 3, no focal deficits   REVIEW OF DATA  I reviewed the following data at the time of this encounter:  GI Procedures and Studies  December 2022 EUS EGD Impression: - No gross lesions in esophagus proximally. Salmon-colored mucosa suspicious for Barrett's esophagus distally - biopsied. - Z-line irregular, 36 cm from the incisors. - 3 cm hiatal hernia. - Friable gastric mucosa at the GE Jxn/cardia within [facility] - biopsied. - Gastritis - biopsied. - Duodenal scar with healing ulcer significantly improved from earlier this year but still active inflammation noted.. 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 This was able to be traversed with the adult endoscope initially and then dilated to 15 mm. - No gross lesions in the second portion of the duodenum. EUS Impression: - A cystic lesion was seen in the pancreatic head and genu of the pancreas. Tissue was obtained from this exam, and results are pending. However, the endosonographic appearance is suggestive of benign inflammatory changes. Fine needle aspiration for fluid performed. - There was no sign of significant pathology parenchyma or ducts within the pancreatic head, genu of the pancreas, pancreatic body and pancreatic tail. - Multiple stones were visualized endosonographically in the gallbladder. - There was no sign of significant pathology in the common bile duct and in the common hepatic duct. - No malignant-appearing lymph nodes were visualized in the celiac region (level 20), peripancreatic region and porta hepatis region.  Pathology FINAL MICROSCOPIC DIAGNOSIS:  A. ESOPHAGUS, DISTAL, BIOPSY:  -  Squamous and glandular epithelium with acute and chronic inflammation  -  Intestinal metaplasia present  -  No dysplasia or malignancy identified  -  See comment  B. GE JUNCTION,  BIOPSY:  -  Minute fragments of squamocolumnar junction with chronic inflammation  -  No intestinal metaplasia, dysplasia or malignancy identified  C. STOMACH, BIOPSY:  -  Benign gastric mucosa with features consistent with proton pump inhibitor effect  -  No H. pylori, intestinal metaplasia or malignancy identified  COMMENT:  A.  With the proper endoscopic findings this is diagnostic of Barrett esophagus.   Laboratory Studies  Reviewed those in epic  Imaging Studies  February 2023 CT abdomen pelvis with and without contrast IMPRESSION: 1. Indeterminate complex 4.0 cm cystic mass in the anterior pancreatic head with coarse heterogeneous internal and peripheral calcifications and borderline thickened enhancing internal septations, not appreciably changed in size from baseline CT abdomen/pelvis study 04/20/2020 CT abdomen/pelvis study. Mucinous cystic pancreatic neoplasm remains on the differential. No significant pancreatic duct dilatation. EUS/FNA remains a suggested management consideration if not previously performed. Continued imaging surveillance recommended in 12 months at a minimum, either MRI (preferred) or CT abdomen without and with IV contrast. 2. Cholelithiasis. No biliary ductal dilatation. 3. Marked sigmoid diverticulosis. 4. Chronic enlarged myomatous uterus. 5. Chronic Bosniak category 1 and category 2 renal cysts. 6. Aortic Atherosclerosis (ICD10-I70.0).  January 2023 CT chest abdomen pelvis IMPRESSION: 1. Anterior dislocation of the right shoulder. 2. Stable 5 mm noncalcified right lower lobe lung nodule. This is seen on the prior studies dated December 14, 2020 and 11-19-20, it is  not clearly identified on the May 01, 2020 exam. No follow-up needed if patient is low-risk. Non-contrast chest CT can be considered in 12 months if patient is high-risk. This recommendation follows the consensus statement: Guidelines for Management of Incidental Pulmonary Nodules  Detected on CT Images: From the Fleischner Society 2017; Radiology 2017; 284:228-243. 3. Cholelithiasis without evidence of acute cholecystitis. 4. Bilateral calcified and noncalcified renal cysts. 5. Sigmoid diverticulosis. 6. Multiple partially calcified and noncalcified uterine fibroids.   ASSESSMENT  Ms. Shanks is a 66 y.o. 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  The patient is seen today for evaluation and management of:  1. Pancreatic cyst   2. Abdominal pain, chronic, epigastric   3. Duodenal stenosis   4. Peptic ulcer disease   5. Barrett's esophagus without dysplasia   6. History of iron deficiency   7. Chronic diarrhea   8. Incontinence of feces, unspecified fecal incontinence type   9. Bilious vomiting with nausea   10. Fever, unspecified fever cause    The patient is hemodynamically stable today.  Clinically, she had been doing better after her recent EUS and cyst aspiration and duodenal stricture dilation but seems to have had some issues recur in the last few days.  She is also been taking NSAIDs which she was unaware naproxen was and so that has potentially exacerbated some symptoms as well.  At this point of asked her to stop taking naproxen.  She will continue her PPI to twice daily therapy.  She will continue taking Carafate therapy taking it between 2-4 times per day and I recommend increasing it up to 4 times per day for the next few weeks since she has had increased symptoms.  She is going to continue taking oral iron as well as receive her iron infusions.  Due to her history of previous iron deficiency and anemia of unclear etiology, I think it is  reasonable to pursue a colonoscopy.  She also has issues of chronic diarrhea that we are going to explore with stool studies but we will also want to rule out issues of microscopic/collagenous colitis and thus performing a colonoscopy as well for that reason/indication will be helpful.  Due to the patient's improvement after duodenal stricture dilation, I think it is reasonable for [place] to try and do a repeat dilation.  It is possible she may require a cold AXIOS 15 mm x 15 mm stent to further dilate or stretch things depending on how she is doing but time will tell and we will begin with repeat balloon dilation as well as removing the NSAIDs from her medications.  It is not clear to me that this pancreatic cyst is not more than just a pseudocyst but in the setting of its quick recurrence we do need to monitor it.  My plan will be for an MRI/MRCP in 6 months and consider the potential of repeat fluid aspiration if there are any changes or if symptomatically she feels that makes an improvement.  Time will tell.  We need to be mindful due to her family history of pancreas cancer in her mother even though it was at a later age.  The risks and benefits of endoscopic evaluation were discussed with the patient; these include but  are not limited to the risk of perforation, infection, bleeding, missed lesions, lack of diagnosis, severe illness requiring hospitalization, as well as anesthesia and sedation related illnesses.  The patient and/or family is agreeable to proceed.  All patient questions were answered to the best of my ability, and the patient agrees to the aforementioned plan of action with follow-up as indicated.   PLAN  Laboratories as outlined below Stool studies as outlined below CA 19-9 to be obtained due to pancreatic cyst for further evaluation in case repeat FNA/FNB may be required and earlier imaging MRI/MRCP to be scheduled for 6 months from now Repeat endoscopy for further duodenal stricture  dilation and if this is not successful or persistent inflammation/stenosis is present after dilation will need to consider potential AXIOS stenting Colonoscopy for evaluation of chronic diarrhea with biopsies but also to for evaluation of her chronic iron deficiency (thankfully iron deficiency has been improving) She may continue to use Imodium for now but may consider Lomotil in future Patient will need formal EGD surveillance in 3 years for Barrett's esophagus nondysplastic   Orders Placed This Encounter  Procedures   Calprotectin, Fecal   CBC   Lipase   TSH   Cortisol   Sedimentation rate   C-reactive protein   INR/PT   HgB A1c   Cancer antigen 19-9   Clostridium difficile Toxin B, Qualitative, Real-Time PCR   GI Profile, Stool, PCR   Fecal fat, qualitative   Pancreatic elastase, fecal   Ambulatory referral to Gastroenterology    New Prescriptions   SUPREP BOWEL PREP KIT 17.5-3.13-1.6 GM/177ML SOLN    Take 1 kit by mouth as directed. For colonoscopy prep   Modified Medications   No medications on file    Planned Follow Up No follow-ups on file.   Total Time in Face-to-Face and in Coordination of Care for patient including independent/personal interpretation/review of prior testing, medical history, examination, medication adjustment, communicating results with the patient directly, and documentation within the EHR is 35 minutes.   Justice Britain, MD [facility] Office # [phone]

## 2021-10-09 ENCOUNTER — Ambulatory Visit (HOSPITAL_COMMUNITY): Payer: Medicare Other | Attending: Orthopedic Surgery

## 2021-10-09 ENCOUNTER — Encounter (HOSPITAL_COMMUNITY): Payer: Self-pay

## 2021-10-09 LAB — CANCER ANTIGEN 19-9: CA 19-9: 3 U/mL (ref <34)

## 2021-10-10 ENCOUNTER — Encounter (HOSPITAL_COMMUNITY): Payer: Self-pay

## 2021-10-10 ENCOUNTER — Other Ambulatory Visit: Payer: Self-pay

## 2021-10-10 ENCOUNTER — Inpatient Hospital Stay (HOSPITAL_COMMUNITY): Payer: Medicare Other

## 2021-10-10 VITALS — BP 130/74 | HR 77 | Temp 96.7°F | Resp 18

## 2021-10-10 DIAGNOSIS — D500 Iron deficiency anemia secondary to blood loss (chronic): Secondary | ICD-10-CM

## 2021-10-10 DIAGNOSIS — Z86711 Personal history of pulmonary embolism: Secondary | ICD-10-CM | POA: Diagnosis not present

## 2021-10-10 DIAGNOSIS — R0602 Shortness of breath: Secondary | ICD-10-CM | POA: Diagnosis not present

## 2021-10-10 DIAGNOSIS — I10 Essential (primary) hypertension: Secondary | ICD-10-CM | POA: Diagnosis not present

## 2021-10-10 DIAGNOSIS — Z79899 Other long term (current) drug therapy: Secondary | ICD-10-CM | POA: Diagnosis not present

## 2021-10-10 DIAGNOSIS — Z7901 Long term (current) use of anticoagulants: Secondary | ICD-10-CM | POA: Diagnosis not present

## 2021-10-10 DIAGNOSIS — S43004D Unspecified dislocation of right shoulder joint, subsequent encounter: Secondary | ICD-10-CM | POA: Diagnosis not present

## 2021-10-10 DIAGNOSIS — Z800 Family history of malignant neoplasm of digestive organs: Secondary | ICD-10-CM | POA: Diagnosis not present

## 2021-10-10 DIAGNOSIS — K862 Cyst of pancreas: Secondary | ICD-10-CM | POA: Diagnosis not present

## 2021-10-10 DIAGNOSIS — K219 Gastro-esophageal reflux disease without esophagitis: Secondary | ICD-10-CM | POA: Diagnosis not present

## 2021-10-10 DIAGNOSIS — M25511 Pain in right shoulder: Secondary | ICD-10-CM | POA: Diagnosis not present

## 2021-10-10 DIAGNOSIS — M25611 Stiffness of right shoulder, not elsewhere classified: Secondary | ICD-10-CM | POA: Diagnosis not present

## 2021-10-10 DIAGNOSIS — M6281 Muscle weakness (generalized): Secondary | ICD-10-CM | POA: Diagnosis not present

## 2021-10-10 MED ORDER — SODIUM CHLORIDE 0.9 % IV SOLN
510.0000 mg | Freq: Once | INTRAVENOUS | Status: AC
  Administered 2021-10-10: 510 mg via INTRAVENOUS
  Filled 2021-10-10: qty 510

## 2021-10-10 MED ORDER — LORATADINE 10 MG PO TABS
10.0000 mg | ORAL_TABLET | Freq: Once | ORAL | Status: AC
  Administered 2021-10-10: 10 mg via ORAL
  Filled 2021-10-10: qty 1

## 2021-10-10 MED ORDER — SODIUM CHLORIDE 0.9 % IV SOLN: Freq: Once | INTRAVENOUS | Status: AC

## 2021-10-10 MED ORDER — ACETAMINOPHEN 325 MG PO TABS
650.0000 mg | ORAL_TABLET | Freq: Once | ORAL | Status: AC
  Administered 2021-10-10: 650 mg via ORAL
  Filled 2021-10-10: qty 2

## 2021-10-10 NOTE — Progress Notes (Signed)
Patient presents today for Feraheme per providers order.  Vital signs WNL.  Patient has no new complaints at this time.  Peripheral IV started and blood return noted pre and post infusion.  Feraheme given today per MD orders.  Tolerated infusion without adverse affects.  Vital signs stable.  No complaints at this time.  Patient refused to wait her 30 minutes post infusion wait time.  Discharge from clinic ambulatory in stable condition.  Alert and oriented X 3.  Follow up with [facility] as scheduled.

## 2021-10-10 NOTE — Patient Instructions (Signed)
[facility]  Discharge Instructions: °Thank you for choosing [facility] to provide your oncology and hematology care.  °If you have a lab appointment with the Cancer Center, please come in thru the Main Entrance and check in at the main information desk. ° °Wear comfortable clothing and clothing appropriate for easy access to any Portacath or PICC line.  ° °We strive to give you quality time with your provider. You may need to reschedule your appointment if you arrive late (15 or more minutes).  Arriving late affects you and other patients whose appointments are after yours.  Also, if you miss three or more appointments without notifying the office, you may be dismissed from the clinic at the provider’s discretion.    °  °For prescription refill requests, have your pharmacy contact our office and allow 72 hours for refills to be completed.   ° °Today you received the following chemotherapy and/or immunotherapy agents Feraheme    °  °To help prevent nausea and vomiting after your treatment, we encourage you to take your nausea medication as directed. ° °BELOW ARE SYMPTOMS THAT SHOULD BE REPORTED IMMEDIATELY: °*FEVER GREATER THAN 100.4 F (38 °C) OR HIGHER °*CHILLS OR SWEATING °*NAUSEA AND VOMITING THAT IS NOT CONTROLLED WITH YOUR NAUSEA MEDICATION °*UNUSUAL SHORTNESS OF BREATH °*UNUSUAL BRUISING OR BLEEDING °*URINARY PROBLEMS (pain or burning when urinating, or frequent urination) °*BOWEL PROBLEMS (unusual diarrhea, constipation, pain near the anus) °TENDERNESS IN MOUTH AND THROAT WITH OR WITHOUT PRESENCE OF ULCERS (sore throat, sores in mouth, or a toothache) °UNUSUAL RASH, SWELLING OR PAIN  °UNUSUAL VAGINAL DISCHARGE OR ITCHING  ° °Items with * indicate a potential emergency and should be followed up as soon as possible or go to the Emergency Department if any problems should occur. ° °Please show the CHEMOTHERAPY ALERT CARD or IMMUNOTHERAPY ALERT CARD at check-in to the Emergency  Department and triage nurse. ° °Should you have questions after your visit or need to cancel or reschedule your appointment, please contact [facility] [phone]  and follow the prompts.  Office hours are 8:00 a.m. to 4:30 p.m. Monday - Friday. Please note that voicemails left after 4:00 p.m. may not be returned until the following business day.  We are closed weekends and major holidays. You have access to a nurse at all times for urgent questions. Please call the main number to the clinic [phone] and follow the prompts. ° °For any non-urgent questions, you may also contact your provider using MyChart. We now offer e-Visits for anyone 18 and older to request care online for non-urgent symptoms. For details visit [email or web address]. °  °Also download the MyChart app! Go to the app store, search "MyChart", open the app, select [facility], and log in with your MyChart username and password. ° °Due to Covid, a mask is required upon entering the hospital/clinic. If you do not have a mask, one will be given to you upon arrival. For doctor visits, patients may have 1 support person aged 18 or older with them. For treatment visits, patients cannot have anyone with them due to current Covid guidelines and our immunocompromised population.  °

## 2021-10-17 ENCOUNTER — Other Ambulatory Visit: Payer: Medicare Other

## 2021-10-17 DIAGNOSIS — G8929 Other chronic pain: Secondary | ICD-10-CM

## 2021-10-17 DIAGNOSIS — K2270 Barrett's esophagus without dysplasia: Secondary | ICD-10-CM

## 2021-10-17 DIAGNOSIS — K862 Cyst of pancreas: Secondary | ICD-10-CM | POA: Diagnosis not present

## 2021-10-17 DIAGNOSIS — K279 Peptic ulcer, site unspecified, unspecified as acute or chronic, without hemorrhage or perforation: Secondary | ICD-10-CM

## 2021-10-17 DIAGNOSIS — Z8639 Personal history of other endocrine, nutritional and metabolic disease: Secondary | ICD-10-CM

## 2021-10-17 DIAGNOSIS — K315 Obstruction of duodenum: Secondary | ICD-10-CM

## 2021-10-17 DIAGNOSIS — R1013 Epigastric pain: Secondary | ICD-10-CM | POA: Diagnosis not present

## 2021-10-19 LAB — CLOSTRIDIUM DIFFICILE TOXIN B, QUALITATIVE, REAL-TIME PCR: Toxigenic C. Difficile by PCR: NOT DETECTED

## 2021-10-20 DIAGNOSIS — S43004D Unspecified dislocation of right shoulder joint, subsequent encounter: Secondary | ICD-10-CM | POA: Diagnosis not present

## 2021-10-20 DIAGNOSIS — M25611 Stiffness of right shoulder, not elsewhere classified: Secondary | ICD-10-CM | POA: Diagnosis not present

## 2021-10-20 DIAGNOSIS — M6281 Muscle weakness (generalized): Secondary | ICD-10-CM | POA: Diagnosis not present

## 2021-10-20 DIAGNOSIS — M25511 Pain in right shoulder: Secondary | ICD-10-CM | POA: Diagnosis not present

## 2021-10-20 LAB — GI PROFILE, STOOL, PCR

## 2021-10-20 LAB — FECAL FAT, QUALITATIVE
Fat Qual Neutral, Stl: NORMAL
Fat Qual Total, Stl: NORMAL

## 2021-10-22 LAB — CALPROTECTIN, FECAL: Calprotectin, Fecal: 24 ug/g (ref 0–120)

## 2021-10-23 ENCOUNTER — Ambulatory Visit: Payer: Medicaid Other | Admitting: Gastroenterology

## 2021-10-24 LAB — PANCREATIC ELASTASE, FECAL: Pancreatic Elastase-1, Stool: >500 mcg/g

## 2021-10-29 ENCOUNTER — Other Ambulatory Visit: Payer: Self-pay

## 2021-10-29 ENCOUNTER — Encounter: Payer: Self-pay | Admitting: Critical Care Medicine

## 2021-10-29 ENCOUNTER — Ambulatory Visit: Payer: Medicare Other | Attending: Critical Care Medicine | Admitting: Critical Care Medicine

## 2021-10-29 VITALS — BP 183/83 | HR 54 | Resp 16 | Wt 176.6 lb

## 2021-10-29 DIAGNOSIS — R002 Palpitations: Secondary | ICD-10-CM | POA: Diagnosis not present

## 2021-10-29 DIAGNOSIS — K2270 Barrett's esophagus without dysplasia: Secondary | ICD-10-CM

## 2021-10-29 DIAGNOSIS — K8020 Calculus of gallbladder without cholecystitis without obstruction: Secondary | ICD-10-CM

## 2021-10-29 DIAGNOSIS — R1114 Bilious vomiting: Secondary | ICD-10-CM

## 2021-10-29 DIAGNOSIS — M170 Bilateral primary osteoarthritis of knee: Secondary | ICD-10-CM

## 2021-10-29 DIAGNOSIS — K269 Duodenal ulcer, unspecified as acute or chronic, without hemorrhage or perforation: Secondary | ICD-10-CM

## 2021-10-29 DIAGNOSIS — K315 Obstruction of duodenum: Secondary | ICD-10-CM

## 2021-10-29 DIAGNOSIS — I10 Essential (primary) hypertension: Secondary | ICD-10-CM

## 2021-10-29 DIAGNOSIS — F332 Major depressive disorder, recurrent severe without psychotic features: Secondary | ICD-10-CM

## 2021-10-29 DIAGNOSIS — R413 Other amnesia: Secondary | ICD-10-CM

## 2021-10-29 DIAGNOSIS — K279 Peptic ulcer, site unspecified, unspecified as acute or chronic, without hemorrhage or perforation: Secondary | ICD-10-CM | POA: Diagnosis not present

## 2021-10-29 DIAGNOSIS — K862 Cyst of pancreas: Secondary | ICD-10-CM | POA: Diagnosis not present

## 2021-10-29 DIAGNOSIS — G8929 Other chronic pain: Secondary | ICD-10-CM

## 2021-10-29 DIAGNOSIS — K529 Noninfective gastroenteritis and colitis, unspecified: Secondary | ICD-10-CM | POA: Diagnosis not present

## 2021-10-29 DIAGNOSIS — G629 Polyneuropathy, unspecified: Secondary | ICD-10-CM

## 2021-10-29 DIAGNOSIS — R109 Unspecified abdominal pain: Secondary | ICD-10-CM

## 2021-10-29 MED ORDER — TIZANIDINE HCL 4 MG PO TABS: 4.0000 mg | ORAL_TABLET | Freq: Three times a day (TID) | ORAL | 0 refills | Status: AC | PRN

## 2021-10-29 MED ORDER — GABAPENTIN 300 MG PO CAPS: 300.0000 mg | ORAL_CAPSULE | Freq: Two times a day (BID) | ORAL | 6 refills | Status: AC

## 2021-10-29 MED ORDER — SUCRALFATE 1 GM/10ML PO SUSP: 1.0000 g | Freq: Three times a day (TID) | ORAL | 2 refills | Status: AC

## 2021-10-29 MED ORDER — TRAZODONE HCL 100 MG PO TABS: 100.0000 mg | ORAL_TABLET | Freq: Every day | ORAL | 1 refills | Status: AC

## 2021-10-29 MED ORDER — QUETIAPINE FUMARATE 50 MG PO TABS: 50.0000 mg | ORAL_TABLET | Freq: Every day | ORAL | 1 refills | Status: DC

## 2021-10-29 MED ORDER — CITALOPRAM HYDROBROMIDE 40 MG PO TABS: 40.0000 mg | ORAL_TABLET | Freq: Every day | ORAL | 2 refills | Status: AC

## 2021-10-29 MED ORDER — TRAMADOL HCL 50 MG PO TABS: 50.0000 mg | ORAL_TABLET | Freq: Three times a day (TID) | ORAL | 0 refills | Status: AC | PRN

## 2021-10-29 MED ORDER — DULOXETINE HCL 40 MG PO CPEP: 1.0000 | ORAL_CAPSULE | Freq: Every day | ORAL | 4 refills | Status: AC

## 2021-10-29 MED ORDER — CARVEDILOL 12.5 MG PO TABS: 12.5000 mg | ORAL_TABLET | Freq: Two times a day (BID) | ORAL | 3 refills | Status: AC

## 2021-10-29 MED ORDER — PROMETHAZINE HCL 12.5 MG PO TABS: 12.5000 mg | ORAL_TABLET | Freq: Three times a day (TID) | ORAL | 0 refills | Status: AC | PRN

## 2021-10-29 MED ORDER — AMLODIPINE BESYLATE 10 MG PO TABS: 10.0000 mg | ORAL_TABLET | Freq: Every day | ORAL | 3 refills | Status: AC

## 2021-10-29 MED ORDER — DONEPEZIL HCL 10 MG PO TABS: 10.0000 mg | ORAL_TABLET | Freq: Two times a day (BID) | ORAL | 0 refills | Status: DC

## 2021-10-29 MED ORDER — LANSOPRAZOLE 30 MG PO TBDD: DELAYED_RELEASE_TABLET | ORAL | 3 refills | Status: AC

## 2021-10-29 NOTE — Assessment & Plan Note (Signed)
Still has episodic nausea and vomiting this may complicate any planned knee replacement ?

## 2021-10-29 NOTE — Progress Notes (Signed)
Established Patient Office Visit  Subjective:  Patient ID: Maria Murphy, female    DOB: Jun 17, 1956  Age: 66 y.o. MRN: [id number]  CC:  Chief Complaint  Patient presents with   Pre-op Exam    HPI  08/20/21 Maria Murphy presents for primary care follow-up.  Patient is about to undergo urgent gastroenterology evaluation in the morning for her duodenal obstruction.  She still has episodes of nausea.  Patient's depression still is not well controlled she still has somewhat disorganized thoughts of suicide which is a chronic condition.  She is never taking action on this.  Patient declined a tetanus vaccine and was offered.  She had a total knee replacement on the left canceled because of her gastrointestinal issues.  On arrival blood pressure is elevated 165/97.  She states at home she has had lower numbers.  The patient is enrolling in [place] healthcare sponsored elderly program 2 days a week.  Patient has no other complaints.   10/29/2021 Maria Murphy presents for preop evaluation for planned right total knee replacement.  Patient's orthopedic surgeon is Fredonia Highland.  Patient has previously been followed by hematology and is now off anticoagulants for prior pulmonary embolism.  Patient's received 2 iron infusions during the month of January.  Iron levels have been improving.  She does not absorb iron well because of her gastrointestinal tract and she also has chronic blood loss from the gastric duodenal areas.  She has had a complete evaluation by gastroenterology.  See documentation below.  Below is documentation from the February hematology visit Has seen Hematology:09/29/21 At today's visit, she reports feeling fair.  She apparently tripped and fell 12 days ago and dislocated her right shoulder.  She also had some bruising to her ribs and hip.  She is recovering slowly at home, but is still having some general soreness and residual fatigue after her fall.   She denies any current signs or  symptoms of blood loss, no gross rectal hemorrhage or melena. She has some chest wall pain secondary to fall 2 weeks ago, but denies any anginal type chest pain. She reports new dyspnea on exertion, as well as recurrent dizziness, lightheadedness, and headache.  She denies any syncope, pica, or restless leg symptoms. She continues to take iron supplementation at home. She denies any unilateral leg swelling, but does report that her bilateral peripheral edema is worse than usual, and that she is now waking up with swollen legs in the morning.   She continues to follow-up with gastroenterology (Dr. Rush Landmark) for her many GI issues.  She is eating better after esophageal dilation in December, but continues to have significant abdominal cramps after eating.  Her weight is stable, although she had previously lost about 50 pounds in a year due to her inability to tolerate adequate oral intake.  She reports that her energy is 50% with 100% appetite.  She has been maintaining a stable weight at this time.  ASSESSMENT & PLAN: 1.  History of bilateral pulmonary embolism - Bilateral PE with right heart strain in September 2021, which occurred during ulcer repair which suggests likely provoked episode - Patient was on Xarelto for close to 6 months, then had GI bleeding - hence anticoagulation was held and hypercoagulable work-up was ordered - Hypercoagulable work-up in March 2022 was negative - Most recent CT with PE protocol was negative - This was single episode of likely provoked PE, although it was high burden PE causing right heart strain; she has GI  ulcers that intermittently bleeding; therefore, do not recommend long-term anticoagulation at this time - No current signs or symptoms of DVT or PE  - PLAN: No intervention or anticoagulation recommended at this time.  If patient has another episode of DVT/PE in the future, we will discuss with her again any further recommendations or changes to the above  plan   2.  Iron deficiency anemia - Patient had GI bleed in March 2022 while on anticoagulation for pulmonary embolism, received PRBC transfusion x1 - She has longstanding history of peptic ulcers that intermittently bleed - EGD 10/18/2020 showed erosive gastropathy with gastric ulcer and duodenal ulcer without any current stigmata of bleeding - EGD 08/21/2021: Friable gastric mucosa, gastritis with patchy mild inflammation characterized by erosions, erythema, and friability; duodenal scar with healing ulcer improved but still with active inflammation noted - Work-up for anemia initiated at last visit in April 2022 showed Hgb 10.6, ferritin 5, iron saturation 6%; vitamin B12, folate, LDH, SPEP/light chains normal - Suspect that she had persistent anemia and iron deficiency secondary to GI bleed in March 2022 - She continues to follow with GI (Dr. Rush Landmark) - She received IV iron with Venofer x5 from 12/31/2020 through 01/17/2021 - She continues to take oral ferrous sulfate 325 mg daily at home.     - She denies any current signs or symptoms of gross rectal hemorrhage or melena, has mildly dark stool secondary to oral iron supplementation    - She is symptomatic with fatigue, dyspnea on exertion, lightheadedness, headache - Most recent labs (09/22/2021): Hgb 12.8/MCV 90.5, ferritin 18, iron saturation 16%, TIBC 451 - PLAN: Recommend IV iron with Feraheme x2  - Continue ferrous sulfate daily at home. - Repeat CBC and iron panel in 4 months with office visit. - Continue GI follow-up for intermittent GI bleeding and peptic ulcer disease   3.  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 Charlynne Cousins, we dont have any information that she has pancreatic cancer at this time. - Patient's mother had pancreatic cancer at age 29 - She was referred for genetic counseling (see note from genetic counselor dated 01/15/2021) -  PLAN: Continue follow-up as above   4.  Palpitations and dyspnea on exertion - Patient reports that she feels her heart go "out of rhythm" - She reports increased dyspnea on exertion and bilateral lower extremity edema - Breath sounds markedly diminished at bases on exam -Referral was previously sent to cardiologist, but she never heard back from them - PLAN: Patient encouraged to follow-up with PCP for work-up of the above symptoms and for possible referral to cardiology. - Patient educated on alarm symptoms that would prompt immediate medical attention at the ED.   Note patient continues to have intermittent palpitations.  Hemoglobin levels are normal in this status.  Hematology wanted a cardiology referral they try to get her into cardiology in [place] but was not able to achieve an appointment in September.  She will need cardiology clearance prior to any knee replacement.  Below is documentation from the gastroenterology visit in February and note patient is planning upcoming colonoscopy in the next 24 hours.  She also will have another stent placed in the duodenum.  10/08/21 GI: Yalonda Sample is a 66 y.o. female with a pmh significant for hypertension, anxiety, MDD, prior VTE/PE, chronic abdominal pain, family history pancreas cancer (mother), gastroduodenitis, PUD leading to perforation with exploratory laparotomy and  patching, duodenal stenosis, Barrett's esophagus, pancreatic cyst (low CEA and mild amylase elevation so not MCN or IPMN), chronic abdominal pain.  The patient presents to the [facility] for an evaluation and management of problem(s) noted below:   Problem List 1. Pancreatic cyst   2. Abdominal pain, chronic, epigastric   3. Duodenal stenosis   4. Peptic ulcer disease   5. Barrett's esophagus without dysplasia   6. History of iron deficiency   7. Chronic diarrhea   8. Incontinence of feces, unspecified fecal incontinence type   9. Bilious vomiting  with nausea   10. Fever, unspecified fever cause       History of Present Illness This is a patient who was previously been evaluated in the [facility] office.  She is transitioning her care to [facility].  She is a patient who has had significant ulcer disease in the past leading to perforation requiring patching.  She had a significant chronic ulcer that persisted over the course of the last few years.  She developed a duodenal stenosis as a result of this.  She also had a pancreatic cyst of an unclear etiology that was noted.  We could not initially perform an EUS due to the significant chronic ulceration but on most recent EUS attempt, we were able to see the cyst and take fluid.  Cyst fluid analysis was documented in the chart but did not suggest IPMN or MCN.  There was concern about potential bilious fluid within that collection, but a bile could never be added to the fluid specimen even after multiple tries.  Repeat imaging was performed that shows a pancreas cyst had redeveloped.  Today, the patient states that after her EUS in December with cyst aspiration as well as duodenal stricture dilation that she was doing better for quite a few weeks.  She was eating more.  Food was not feeling as if it was staying in the stomach too long.  However she still had days where she did have her chronic abdominal pain.  Thanks progressed in the course the last 48 hours where she had more significant nausea and vomiting.  Vomiting did help with her symptoms.  The patient also tells me that she has had chronic diarrhea for the last 2 years as well as significant fecal urgency.  She has had episodes of fecal incontinence as well.  She states that she had a previous Cologuard done within the last 2 to 3 years and that it was negative.  She chose that method of colon cancer screening because she wanted to minimize colonoscopy if possible but this predated the symptoms of her chronic diarrhea.  About a year ago she  did have an episode of bright red blood per rectum with her diarrhea but that has not recurred.  There is no family history of colorectal cancer.  Patient takes Imodium on a relatively frequent basis due to her diarrhea and it does slow things down to allow her to be functional but she is always worried about incontinence occurring or not getting to the restroom quickly enough.  Within the last month she was in an accident and had further worsening of her right shoulder rotator cuff.  She was actually prescribed ibuprofen and naproxen.  Over the course the last few weeks she has been taking naproxen also to help with fevers of unclear etiology as well as pain and she believes that since the initiation of the naproxen her abdominal pain had worsened and she  did not know that naproxen was in NSAID which she knew she should not be taking.  The patient is receiving further IV iron infusions with her next 1 later this week.  She does have a history of iron deficiency anemia of unclear etiology but she is followed by the hematology group.    GI Procedures and Studies  December 2022 EUS EGD Impression: - No gross lesions in esophagus proximally. Salmon-colored mucosa suspicious for Barrett's esophagus distally - biopsied. - Z-line irregular, 36 cm from the incisors. - 3 cm hiatal hernia. - Friable gastric mucosa at the GE Jxn/cardia within [facility] - biopsied. - Gastritis - biopsied. - Duodenal scar with healing ulcer significantly improved from earlier this year but still active inflammation noted.. 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 This was able to be traversed with the adult endoscope initially and then dilated to 15 mm. - No gross lesions in the second portion of the duodenum. EUS Impression: - A cystic lesion was seen in the pancreatic head and genu of the pancreas. Tissue was obtained from this exam, and results are pending. However, the endosonographic appearance is suggestive of benign  inflammatory changes. Fine needle aspiration for fluid performed. - There was no sign of significant pathology parenchyma or ducts within the pancreatic head, genu of the pancreas, pancreatic body and pancreatic tail. - Multiple stones were visualized endosonographically in the gallbladder. - There was no sign of significant pathology in the common bile duct and in the common hepatic duct. - No malignant-appearing lymph nodes were visualized in the celiac region (level 20), peripancreatic region and porta hepatis region.   Pathology FINAL MICROSCOPIC DIAGNOSIS:  A. ESOPHAGUS, DISTAL, BIOPSY:  -  Squamous and glandular epithelium with acute and chronic inflammation  -  Intestinal metaplasia present  -  No dysplasia or malignancy identified  -  See comment  B. GE JUNCTION, BIOPSY:  -  Minute fragments of squamocolumnar junction with chronic inflammation  -  No intestinal metaplasia, dysplasia or malignancy identified  C. STOMACH, BIOPSY:  -  Benign gastric mucosa with features consistent with proton pump inhibitor effect  -  No H. pylori, intestinal metaplasia or malignancy identified  COMMENT:  A.  With the proper endoscopic findings this is diagnostic of Barrett esophagus.    Laboratory Studies  Reviewed those in epic   Imaging Studies  February 2023 CT abdomen pelvis with and without contrast IMPRESSION: 1. Indeterminate complex 4.0 cm cystic mass in the anterior pancreatic head with coarse heterogeneous internal and peripheral calcifications and borderline thickened enhancing internal septations, not appreciably changed in size from baseline CT abdomen/pelvis study 04/20/2020 CT abdomen/pelvis study. Mucinous cystic pancreatic neoplasm remains on the differential. No significant pancreatic duct dilatation. EUS/FNA remains a suggested management consideration if not previously performed. Continued imaging surveillance recommended in 12 months at a minimum, either MRI (preferred) or CT  abdomen without and with IV contrast. 2. Cholelithiasis. No biliary ductal dilatation. 3. Marked sigmoid diverticulosis. 4. Chronic enlarged myomatous uterus. 5. Chronic Bosniak category 1 and category 2 renal cysts. 6. Aortic Atherosclerosis (ICD10-I70.0).   January 2023 CT chest abdomen pelvis IMPRESSION: 1. Anterior dislocation of the right shoulder. 2. Stable 5 mm noncalcified right lower lobe lung nodule. This is seen on the prior studies dated December 14, 2020 and 2020/11/28, it is not clearly identified on the May 01, 2020 exam. No follow-up needed if patient is low-risk. Non-contrast chest CT can be considered in 12 months if patient  is high-risk. This recommendation follows the consensus statement: Guidelines for Management of Incidental Pulmonary Nodules Detected on CT Images: From the Fleischner Society 2017; Radiology 2017; 284:228-243. 3. Cholelithiasis without evidence of acute cholecystitis. 4. Bilateral calcified and noncalcified renal cysts. 5. Sigmoid diverticulosis. 6. Multiple partially calcified and noncalcified uterine fibroids.     ASSESSMENT  Ms. Weesner is a 66 y.o. 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  The patient is seen today for evaluation and management of:   1. Pancreatic cyst   2. Abdominal pain, chronic, epigastric   3. Duodenal stenosis   4. Peptic ulcer disease   5. Barrett's esophagus without dysplasia   6. History of iron deficiency   7. Chronic diarrhea   8. Incontinence of feces, unspecified fecal incontinence type   9. Bilious vomiting with nausea   10. Fever, unspecified fever cause     The patient is  hemodynamically stable today.  Clinically, she had been doing better after her recent EUS and cyst aspiration and duodenal stricture dilation but seems to have had some issues recur in the last few days.  She is also been taking NSAIDs which she was unaware naproxen was and so that has potentially exacerbated some symptoms as well.  At this point of asked her to stop taking naproxen.  She will continue her PPI to twice daily therapy.  She will continue taking Carafate therapy taking it between 2-4 times per day and I recommend increasing it up to 4 times per day for the next few weeks since she has had increased symptoms.  She is going to continue taking oral iron as well as receive her iron infusions.  Due to her history of previous iron deficiency and anemia of unclear etiology, I think it is reasonable to pursue a colonoscopy.  She also has issues of chronic diarrhea that we are going to explore with stool studies but we will also want to rule out issues of microscopic/collagenous colitis and thus performing a colonoscopy as well for that reason/indication will be helpful.  Due to the patient's improvement after duodenal stricture dilation, I think it is reasonable for [place] to try and do a repeat dilation.  It is possible she may require a cold AXIOS 15 mm x 15 mm stent to further dilate or stretch things depending on how she is doing but time will tell and we will begin with repeat balloon dilation as well as removing the NSAIDs from her medications.  It is not clear to me that this pancreatic cyst is not more than just a pseudocyst but in the setting of its quick recurrence we do need to monitor it.  My plan will be for an MRI/MRCP in 6 months and consider the potential of repeat fluid aspiration if there are any changes or if symptomatically she feels that makes an improvement.  Time will tell.  We need to be mindful due to her family history of pancreas cancer in her mother even though it was at a later age.   The risks and benefits of endoscopic evaluation were discussed with the patient; these include but are not limited to the risk of perforation, infection, bleeding, missed lesions, lack of diagnosis, severe illness requiring hospitalization, as well as anesthesia  and sedation related illnesses.  The patient and/or family is agreeable to proceed.  All patient questions were answered to the best of my ability, and the patient agrees to the aforementioned plan of action with follow-up as indicated.     PLAN  Laboratories as outlined below Stool studies as outlined below CA 19-9 to be obtained due to pancreatic cyst for further evaluation in case repeat FNA/FNB may be required and earlier imaging MRI/MRCP to be scheduled for 6 months from now Repeat endoscopy for further duodenal stricture dilation and if this is not successful or persistent inflammation/stenosis is present after dilation will need to consider potential AXIOS stenting Colonoscopy for evaluation of chronic diarrhea with biopsies but also to for evaluation of her chronic iron deficiency (thankfully iron deficiency has been improving) She may continue to use Imodium for now but may consider Lomotil in future Patient will need formal EGD surveillance in 3 years for Barrett's esophagus nondysplastic   At this point the biggest issue is that she has sufficient protein stores for healing and she is yet to be cleared by cardiology.  Albumin levels are normal we will check a prealbumin level at this visit.  The patient still has occasional diarrhea stool studies were normal.  Patient still is having the palpitations that come and go will need further cardiology clearance.  From a gastrointestinal perspective I think she is stable enough to undergo knee replacement. Elevated blood pressure is another issue here today blood pressure remains in the 165/90 range Past Medical History:  Diagnosis Date   Acute saddle pulmonary embolism without  acute cor pulmonale (HCC)    Basal cell carcinoma 01/16/2021   L nose, MOHs 04/14/21   Collagen vascular disease ([place])    Duodenal ulcer    Essential hypertension, benign 01/24/2015   Family history of ovarian cancer 01/06/2021   Family history of pancreatic cancer 01/06/2021   Family history of uterine cancer 01/06/2021   Foot ulcer (HCC)    GERD (gastroesophageal reflux disease)    Hypertension    Memory loss    Peptic ulcer with perforation (HCC)    Perforated abdominal viscus 04/22/2020   PUD (peptic ulcer disease)    with perforation s/p exploratory laparotomy   Pulmonary embolus ([place]) 04/2020   Skin cancer    Suicidal ideations 08/07/2020   Vision abnormalities     Past Surgical History:  Procedure Laterality Date   BILIARY DILATION  08/21/2021   Procedure: DUODENAL DILATION;  Surgeon: Irving Copas., MD;  Location: [place];  Service: Gastroenterology;;   BIOPSY  11/20/2020   Procedure: BIOPSY;  Surgeon: Irving Copas., MD;  Location: [facility];  Service: Gastroenterology;;   BIOPSY  08/21/2021   Procedure: BIOPSY;  Surgeon: Irving Copas., MD;  Location: [facility];  Service: Gastroenterology;;   CENTRAL LINE INSERTION Right 04/22/2020   Procedure: CENTRAL LINE INSERTION;  Surgeon: Virl Cagey, MD;  Location: [facility];  Service: General;  Laterality: Right;   DIAGNOSTIC LAPAROSCOPY     ESOPHAGOGASTRODUODENOSCOPY N/A 11/20/2020   Procedure: ESOPHAGOGASTRODUODENOSCOPY (EGD);  Surgeon: Irving Copas., MD;  Location: [facility];  Service: Gastroenterology;  Laterality: N/A;   ESOPHAGOGASTRODUODENOSCOPY (EGD) WITH PROPOFOL N/A 07/10/2020   Procedure: ESOPHAGOGASTRODUODENOSCOPY (EGD) WITH PROPOFOL;  Surgeon: Rogene Houston, MD;  [facility];  Service: Endoscopy;  Laterality: N/A;   ESOPHAGOGASTRODUODENOSCOPY (EGD) WITH PROPOFOL N/A 10/18/2020   Procedure: ESOPHAGOGASTRODUODENOSCOPY (EGD) WITH PROPOFOL;  Surgeon:  Harvel Quale, MD;  [facility];  Service: Gastroenterology;  Laterality: N/A;   ESOPHAGOGASTRODUODENOSCOPY (EGD) WITH PROPOFOL N/A 08/21/2021   Procedure: ESOPHAGOGASTRODUODENOSCOPY (EGD) WITH PROPOFOL;  Surgeon: Rush Landmark Telford Nab., MD;  Location: [place];  Service: Gastroenterology;  Laterality: N/A;   EUS N/A 11/20/2020   Procedure: UPPER ENDOSCOPIC ULTRASOUND (EUS) LINEAR;  Surgeon: Irving Copas., MD;  Location: [facility];  Service: Gastroenterology;  Laterality: N/A;   EUS  08/21/2021   Procedure: UPPER ENDOSCOPIC ULTRASOUND (EUS) LINEAR;  Surgeon: Rush Landmark Telford Nab., MD;  Location: [place];  Service: Gastroenterology;;   FINE NEEDLE ASPIRATION  08/21/2021   Procedure: FINE NEEDLE ASPIRATION (FNA) LINEAR;  Surgeon: Irving Copas., MD;  Location: [place];  Service: Gastroenterology;;   Toniann Ket  04/22/2020   Procedure: Toniann Ket;  Surgeon: Virl Cagey, MD;  Location: [facility];  Service: General;;   IR CATHETER TUBE CHANGE  05/09/2020   LAPAROTOMY N/A 04/22/2020   Procedure: EXPLORATORY LAPAROTOMY;  Surgeon: Virl Cagey, MD;  Location: [facility];  Service: General;  Laterality: N/A;   TONSILLECTOMY      Family History  Problem Relation Age of Onset   High Cholesterol Mother    Hypertension Mother    Arthritis/Rheumatoid Mother    Pancreatic cancer Mother 30   Alcohol abuse Father    Hypertension Maternal Grandmother    Uterine cancer Maternal Grandmother 51   Ovarian cancer Cousin        before age 61   Colon cancer Neg Hx    Colon polyps Neg Hx    Inflammatory bowel disease Neg Hx    Esophageal cancer Neg Hx    Liver disease Neg Hx    Rectal cancer Neg Hx    Stomach cancer Neg Hx     Social History   Socioeconomic History   Marital status: Widowed    Spouse name: Not on file   Number of children: 2   Years of education: Not on file   Highest education level: Not on file  Occupational  History   Not on file  Tobacco Use   Smoking status: Never   Smokeless tobacco: Never  Vaping Use   Vaping Use: Never used  Substance and Sexual Activity   Alcohol use: Yes    Alcohol/week: 2.0 - 3.0 standard drinks    Types: 2 - 3 Glasses of wine per week   Drug use: No   Sexual activity: Not Currently    Birth control/protection: Post-menopausal  Other Topics Concern   Not on file  Social History Narrative   Not on file   Social Determinants of Health   Financial Resource Strain: Low Risk    Difficulty of Paying Living Expenses: Not hard at all  Food Insecurity: No Food Insecurity   Worried About Charity fundraiser in the Last Year: Never true   Ran Out of Food in the Last Year: Never true  Transportation Needs: Unmet Transportation Needs   Lack of Transportation (Medical): Yes   Lack of Transportation (Non-Medical): No  Physical Activity: Insufficiently Active   Days of Exercise per Week: 7 days   Minutes of Exercise per Session: 10 min  Stress: Stress Concern Present   Feeling of Stress : Very much  Social Connections: Moderately Integrated   Frequency of Communication with Friends and Family: More than three times a week   Frequency of Social Gatherings with Friends and Family: More than three times a week   Attends Religious Services: More than 4 times per  year   Active Member of Clubs or Organizations: Yes   Attends Archivist Meetings: More than 4 times per year   Marital Status: Widowed  Human resources officer Violence: Not At Risk   Fear of Current or Ex-Partner: No   Emotionally Abused: No   Physically Abused: No   Sexually Abused: No    Outpatient Medications Prior to Visit  Medication Sig Dispense Refill   acetaminophen (TYLENOL) 500 MG tablet Take 1 tablet (500 mg total) by mouth every 6 (six) hours as needed for mild pain or fever.     Ascorbic Acid (VITAMIN C PO) Take 1 tablet by mouth daily.     ferrous sulfate 325 (65 FE) MG tablet Take 325  mg by mouth in the morning.     lactose free nutrition (BOOST) LIQD Take 237 mLs by mouth 2 (two) times daily. Boost Breeze Clear     lidocaine (LIDODERM) 5 % Place 1 patch onto the skin daily. Remove & Discard patch within 12 hours or as directed by MD 30 patch 4   loperamide (IMODIUM) 2 MG capsule Take 1 capsule (2 mg total) by mouth 2 (two) times daily as needed for diarrhea or loose stools. 14 capsule 0   SUPREP BOWEL PREP KIT 17.5-3.13-1.6 GM/177ML SOLN Take 1 kit by mouth as directed. For colonoscopy prep 354 mL 0   amLODipine (NORVASC) 10 MG tablet Take 1 tablet (10 mg total) by mouth daily. For BP 90 tablet 3   citalopram (CELEXA) 40 MG tablet Take 1 tablet (40 mg total) by mouth at bedtime. 90 tablet 2   donepezil (ARICEPT) 10 MG tablet Take 1 tablet (10 mg total) by mouth in the morning and at bedtime. 180 tablet 0   DULoxetine HCl 40 MG CPEP Take 1 capsule by mouth daily. 60 capsule 4   gabapentin (NEURONTIN) 300 MG capsule Take 1 capsule (300 mg total) by mouth 2 (two) times daily. 60 capsule 6   ibuprofen (ADVIL) 800 MG tablet Take 800 mg by mouth 3 (three) times daily.     lansoprazole (PREVACID SOLUTAB) 30 MG disintegrating tablet Place 1 tablet on tongue twice daily and allow to dissolve until particles can be swallowed. Do not swallow whole, break, cut, or chew tablet. 60 tablet 3   predniSONE (STERAPRED UNI-PAK 48 TAB) 10 MG (48) TBPK tablet Take by mouth as directed.     promethazine (PHENERGAN) 12.5 MG tablet Take 1 tablet (12.5 mg total) by mouth every 8 (eight) hours as needed for nausea or vomiting. 20 tablet 0   QUEtiapine (SEROQUEL) 50 MG tablet Take 1 tablet (50 mg total) by mouth at bedtime. 90 tablet 1   sucralfate (CARAFATE) 1 GM/10ML suspension Take 10 mLs (1 g total) by mouth 4 (four) times daily -  with meals and at bedtime. 420 mL 2   tiZANidine (ZANAFLEX) 4 MG tablet Take 1 tablet (4 mg total) by mouth every 8 (eight) hours as needed for muscle spasms. 30 tablet 0    traMADol (ULTRAM) 50 MG tablet Take 1 tablet (50 mg total) by mouth every 8 (eight) hours as needed. 60 tablet 0   traZODone (DESYREL) 100 MG tablet Take 1 tablet (100 mg total) by mouth at bedtime. 30 tablet 1   No facility-administered medications prior to visit.    Allergies  Allergen Reactions   Wheat Extract Swelling   Ketamine Other (See Comments)    Agitation, confusion, hypertension, tachycardia    ROS Review of Systems  Constitutional:  Negative for chills, diaphoresis and fever.  HENT:  Negative for congestion, hearing loss, nosebleeds, sore throat and tinnitus.   Eyes:  Negative for photophobia and redness.  Respiratory:  Negative for cough, shortness of breath, wheezing and stridor.   Cardiovascular:  Negative for chest pain, palpitations and leg swelling.  Gastrointestinal:  Negative for abdominal pain, blood in stool, constipation, diarrhea, nausea and vomiting.  Endocrine: Negative for polydipsia.  Genitourinary:  Negative for dysuria, flank pain, frequency, hematuria and urgency.  Musculoskeletal:  Negative for back pain, myalgias and neck pain.  Skin:  Negative for rash.  Allergic/Immunologic: Negative for environmental allergies.  Neurological:  Negative for dizziness, tremors, seizures, weakness and headaches.  Hematological:  Does not bruise/bleed easily.  Psychiatric/Behavioral:  Positive for dysphoric mood and suicidal ideas. Negative for self-injury and sleep disturbance. The patient is not nervous/anxious.      Objective:    Physical Exam Vitals reviewed.  Constitutional:      Appearance: Normal appearance. She is well-developed. She is not diaphoretic.  HENT:     Head: Normocephalic and atraumatic.     Nose: No nasal deformity, septal deviation, mucosal edema or rhinorrhea.     Right Sinus: No maxillary sinus tenderness or frontal sinus tenderness.     Left Sinus: No maxillary sinus tenderness or frontal sinus tenderness.     Mouth/Throat:      Pharynx: No oropharyngeal exudate.  Eyes:     General: No scleral icterus.    Conjunctiva/sclera: Conjunctivae normal.     Pupils: Pupils are equal, round, and reactive to light.  Neck:     Thyroid: No thyromegaly.     Vascular: No carotid bruit or JVD.     Trachea: Trachea normal. No tracheal tenderness or tracheal deviation.  Cardiovascular:     Rate and Rhythm: Normal rate and regular rhythm.     Chest Wall: PMI is not displaced.     Pulses: Normal pulses. No decreased pulses.     Heart sounds: Normal heart sounds, S1 normal and S2 normal. Heart sounds not distant. No murmur heard. No systolic murmur is present.  No diastolic murmur is present.    No friction rub. No gallop. No S3 or S4 sounds.  Pulmonary:     Effort: No tachypnea, accessory muscle usage or respiratory distress.     Breath sounds: No stridor. No decreased breath sounds, wheezing, rhonchi or rales.  Chest:     Chest wall: No tenderness.  Abdominal:     General: Bowel sounds are normal. There is no distension.     Palpations: Abdomen is soft. Abdomen is not rigid.     Tenderness: There is abdominal tenderness. There is no guarding or rebound.     Comments: Tender epigastric area  Musculoskeletal:        General: Tenderness present.     Cervical back: Normal range of motion and neck supple. No edema, erythema or rigidity. No muscular tenderness. Normal range of motion.     Comments: Decreased range of motion right knee  Lymphadenopathy:     Head:     Right side of head: No submental or submandibular adenopathy.     Left side of head: No submental or submandibular adenopathy.     Cervical: No cervical adenopathy.  Skin:    General: Skin is warm and dry.     Coloration: Skin is not pale.     Findings: No rash.     Nails: There is no clubbing.  Neurological:     General: No focal deficit present.     Mental Status: She is alert and oriented to person, place, and time. Mental status is at baseline.      Sensory: No sensory deficit.  Psychiatric:        Attention and Perception: Attention and perception normal.        Mood and Affect: Mood is depressed. Affect is flat.        Speech: Speech normal.        Behavior: Behavior normal. Behavior is cooperative.        Thought Content: Thought content is not paranoid or delusional. Thought content includes suicidal ideation. Thought content does not include homicidal ideation. Thought content does not include homicidal or suicidal plan.        Cognition and Memory: Memory is impaired. She exhibits impaired remote memory.        Judgment: Judgment normal.    BP (!) 183/83    Pulse (!) 54    Resp 16    Wt 176 lb 9.6 oz (80.1 kg)    SpO2 98%    BMI 30.31 kg/m  Wt Readings from Last 3 Encounters:  10/29/21 176 lb 9.6 oz (80.1 kg)  10/08/21 174 lb (78.9 kg)  09/29/21 175 lb 11.3 oz (79.7 kg)     Health Maintenance Due  Topic Date Due   COVID-19 Vaccine (3 - Moderna risk series) 01/10/2020    There are no preventive care reminders to display for this patient.  Lab Results  Component Value Date   TSH 1.31 10/08/2021   Lab Results  Component Value Date   WBC 6.8 10/08/2021   HGB 12.3 10/08/2021   HCT 36.3 10/08/2021   MCV 90.2 10/08/2021   PLT 187.0 10/08/2021   Lab Results  Component Value Date   NA 135 09/24/2021   K 4.4 09/24/2021   CO2 22 09/24/2021   GLUCOSE 156 (H) 09/24/2021   BUN 14 09/24/2021   CREATININE 0.64 09/24/2021   BILITOT 0.5 09/24/2021   ALKPHOS 44 09/24/2021   AST 16 09/24/2021   ALT 8 09/24/2021   PROT 6.7 09/24/2021   ALBUMIN 3.9 09/24/2021   CALCIUM 8.6 (L) 09/24/2021   ANIONGAP 4 (L) 09/24/2021   No results found for: CHOL No results found for: HDL No results found for: LDLCALC No results found for: TRIG No results found for: CHOLHDL Lab Results  Component Value Date   HGBA1C 5.5 10/08/2021      Assessment & Plan:   Problem List Items Addressed This Visit       Cardiovascular and  Mediastinum   Essential hypertension    Blood pressure not yet controlled Plan to add carvedilol 12.5 mg twice daily continue amlodipine as prescribed  Cardiology referral for preop clearance will be obtained urgently for Dr. Percell Miller with regards to the total right knee replacement      Relevant Medications   amLODipine (NORVASC) 10 MG tablet   carvedilol (COREG) 12.5 MG tablet   Other Relevant Orders   Ambulatory referral to Cardiology     Digestive   Cholelithiasis    Cholelithiasis without common bile duct disease we will monitor      Chronic diarrhea    Stool studies negative I wonder if she simply just suffering from malabsorption      Relevant Orders   Prealbumin   Duodenal stricture    Esophageal stricture is to be addressed again in the morning with upper  endoscopy per Dr. Rush Landmark      Relevant Orders   Prealbumin   Pancreatic cyst    Chronic pancreatic cyst with duodenal stricture this likely may complicate any postoperative course with her knee replacement also need to reassess nutrition status with a prealbumin      Peptic ulcer disease    Ongoing peptic ulcer disease recent upper endoscopy showed gastritis and esophagitis patient is on appropriate therapy with double dose proton pump inhibitor and Carafate  Patient will need additional duodenal stricture assessment this is going to occur tomorrow 10/30/2021 also needs colonoscopy note stool studies were negative with chronic diarrhea      Barrett's esophagus without dysplasia    Esophagitis shows Barrett's esophagus being monitored by gastroenterology      Nausea and vomiting    Still has episodic nausea and vomiting this may complicate any planned knee replacement        Nervous and Auditory   Peripheral polyneuropathy    Significant polyneuropathy involving both feet      Relevant Medications   citalopram (CELEXA) 40 MG tablet   donepezil (ARICEPT) 10 MG tablet   DULoxetine HCl 40 MG CPEP    gabapentin (NEURONTIN) 300 MG capsule   QUEtiapine (SEROQUEL) 50 MG tablet   tiZANidine (ZANAFLEX) 4 MG tablet   traZODone (DESYREL) 100 MG tablet     Musculoskeletal and Integument   Primary osteoarthritis of both knees    Status post left knee replacement in the past needs a right knee replacement  At this point I cannot clear this patient from a cardiac standpoint she is not optimized she is optimized medically only at this time she is still a prohibitive risk I would like cardiology's input on her I am obtaining a stat consult on this and will be back in touch with Dr. Percell Miller with regards to these results in the interim I will send an interim report over to his office gastroenterology also is to be given follow-up as well      Relevant Medications   tiZANidine (ZANAFLEX) 4 MG tablet   traMADol (ULTRAM) 50 MG tablet     Other   Memory loss    This has improved with Aricept      Chronic abdominal pain   Relevant Medications   citalopram (CELEXA) 40 MG tablet   DULoxetine HCl 40 MG CPEP   gabapentin (NEURONTIN) 300 MG capsule   tiZANidine (ZANAFLEX) 4 MG tablet   traMADol (ULTRAM) 50 MG tablet   traZODone (DESYREL) 100 MG tablet   Severe episode of recurrent major depressive disorder, without psychotic features ([place])    Patient with history of significant depression she is on 2 different antidepressants she does not have a specific plan for suicide were watching      Relevant Medications   citalopram (CELEXA) 40 MG tablet   DULoxetine HCl 40 MG CPEP   QUEtiapine (SEROQUEL) 50 MG tablet   traZODone (DESYREL) 100 MG tablet   Palpitations - Primary    Plan referral to cardiology      Relevant Orders   Ambulatory referral to Cardiology   Other Visit Diagnoses     Duodenal ulcer       Relevant Medications   lansoprazole (PREVACID SOLUTAB) 30 MG disintegrating tablet   PUD (peptic ulcer disease)       Relevant Medications   sucralfate (CARAFATE) 1 GM/10ML suspension        Meds ordered this encounter  Medications   amLODipine (NORVASC)  10 MG tablet    Sig: Take 1 tablet (10 mg total) by mouth daily. For BP    Dispense:  90 tablet    Refill:  3   citalopram (CELEXA) 40 MG tablet    Sig: Take 1 tablet (40 mg total) by mouth at bedtime.    Dispense:  90 tablet    Refill:  2    Dose change   donepezil (ARICEPT) 10 MG tablet    Sig: Take 1 tablet (10 mg total) by mouth in the morning and at bedtime.    Dispense:  180 tablet    Refill:  0   DULoxetine HCl 40 MG CPEP    Sig: Take 1 capsule by mouth daily.    Dispense:  60 capsule    Refill:  4   gabapentin (NEURONTIN) 300 MG capsule    Sig: Take 1 capsule (300 mg total) by mouth 2 (two) times daily.    Dispense:  60 capsule    Refill:  6   lansoprazole (PREVACID SOLUTAB) 30 MG disintegrating tablet    Sig: Place 1 tablet on tongue twice daily and allow to dissolve until particles can be swallowed. Do not swallow whole, break, cut, or chew tablet.    Dispense:  60 tablet    Refill:  3   promethazine (PHENERGAN) 12.5 MG tablet    Sig: Take 1 tablet (12.5 mg total) by mouth every 8 (eight) hours as needed for nausea or vomiting.    Dispense:  20 tablet    Refill:  0   QUEtiapine (SEROQUEL) 50 MG tablet    Sig: Take 1 tablet (50 mg total) by mouth at bedtime.    Dispense:  90 tablet    Refill:  1   sucralfate (CARAFATE) 1 GM/10ML suspension    Sig: Take 10 mLs (1 g total) by mouth 4 (four) times daily -  with meals and at bedtime.    Dispense:  420 mL    Refill:  2   tiZANidine (ZANAFLEX) 4 MG tablet    Sig: Take 1 tablet (4 mg total) by mouth every 8 (eight) hours as needed for muscle spasms.    Dispense:  30 tablet    Refill:  0   traMADol (ULTRAM) 50 MG tablet    Sig: Take 1 tablet (50 mg total) by mouth every 8 (eight) hours as needed.    Dispense:  60 tablet    Refill:  0   traZODone (DESYREL) 100 MG tablet    Sig: Take 1 tablet (100 mg total) by mouth at bedtime.    Dispense:  30  tablet    Refill:  1   carvedilol (COREG) 12.5 MG tablet    Sig: Take 1 tablet (12.5 mg total) by mouth 2 (two) times daily with a meal.    Dispense:  60 tablet    Refill:  3  65 minutes spent going over the patient's old records performing history and physical complex assessment high risk of hospital readmission high degree of complexity and all medication prescriptions collaboration with multiple providers  Follow-up: Return in about 6 weeks (around 12/10/2021).    Asencion Noble, MD

## 2021-10-29 NOTE — Assessment & Plan Note (Signed)
Significant polyneuropathy involving both feet ?

## 2021-10-29 NOTE — Assessment & Plan Note (Signed)
This has improved with Aricept ?

## 2021-10-29 NOTE — Assessment & Plan Note (Signed)
Patient with history of significant depression she is on 2 different antidepressants she does not have a specific plan for suicide were watching ?

## 2021-10-29 NOTE — Assessment & Plan Note (Signed)
Chronic pancreatic cyst with duodenal stricture this likely may complicate any postoperative course with her knee replacement also need to reassess nutrition status with a prealbumin ?

## 2021-10-29 NOTE — Assessment & Plan Note (Signed)
Esophagitis shows Barrett's esophagus being monitored by gastroenterology ?

## 2021-10-29 NOTE — Assessment & Plan Note (Signed)
Ongoing peptic ulcer disease recent upper endoscopy showed gastritis and esophagitis patient is on appropriate therapy with double dose proton pump inhibitor and Carafate ? ?Patient will need additional duodenal stricture assessment this is going to occur tomorrow 10/30/2021 also needs colonoscopy note stool studies were negative with chronic diarrhea ?

## 2021-10-29 NOTE — Assessment & Plan Note (Signed)
Blood pressure not yet controlled ?Plan to add carvedilol 12.5 mg twice daily continue amlodipine as prescribed ? ?Cardiology referral for preop clearance will be obtained urgently for Dr. Percell Miller with regards to the total right knee replacement ?

## 2021-10-29 NOTE — Assessment & Plan Note (Signed)
Plan referral to cardiology

## 2021-10-29 NOTE — Assessment & Plan Note (Signed)
Status post left knee replacement in the past needs a right knee replacement ? ?At this point I cannot clear this patient from a cardiac standpoint she is not optimized she is optimized medically only at this time she is still a prohibitive risk I would like cardiology's input on her I am obtaining a stat consult on this and will be back in touch with Dr. Percell Miller with regards to these results in the interim I will send an interim report over to his office gastroenterology also is to be given follow-up as well ?

## 2021-10-29 NOTE — Assessment & Plan Note (Addendum)
Stool studies negative I wonder if she simply just suffering from malabsorption ?

## 2021-10-29 NOTE — Patient Instructions (Signed)
Referral to Cardiology was made ? ?Keep your GI appointments with Dr Rush Landmark ? ?We cannot clear you yet for knee surgery, we will let Dr Percell Miller know ? ?Refills on all medications sent, start carvediolol 12.'5mg'$  twice daily ? ?Return Dr Joya Gaskins 6 weeks after seen by cardiology ? ?Labs today Prealbumen ? ? ?

## 2021-10-29 NOTE — Assessment & Plan Note (Signed)
Cholelithiasis without common bile duct disease we will monitor ?

## 2021-10-29 NOTE — Assessment & Plan Note (Signed)
Esophageal stricture is to be addressed again in the morning with upper endoscopy per Dr. Rush Landmark ?

## 2021-10-30 ENCOUNTER — Ambulatory Visit (AMBULATORY_SURGERY_CENTER): Payer: Medicare Other | Admitting: Gastroenterology

## 2021-10-30 ENCOUNTER — Encounter: Payer: Self-pay | Admitting: Gastroenterology

## 2021-10-30 ENCOUNTER — Telehealth: Payer: Self-pay

## 2021-10-30 VITALS — BP 167/86 | HR 66 | Temp 98.0°F | Resp 15 | Ht 64.0 in | Wt 174.0 lb

## 2021-10-30 DIAGNOSIS — K641 Second degree hemorrhoids: Secondary | ICD-10-CM | POA: Diagnosis not present

## 2021-10-30 DIAGNOSIS — R1013 Epigastric pain: Secondary | ICD-10-CM

## 2021-10-30 DIAGNOSIS — K259 Gastric ulcer, unspecified as acute or chronic, without hemorrhage or perforation: Secondary | ICD-10-CM | POA: Diagnosis not present

## 2021-10-30 DIAGNOSIS — K449 Diaphragmatic hernia without obstruction or gangrene: Secondary | ICD-10-CM

## 2021-10-30 DIAGNOSIS — D122 Benign neoplasm of ascending colon: Secondary | ICD-10-CM

## 2021-10-30 DIAGNOSIS — K319 Disease of stomach and duodenum, unspecified: Secondary | ICD-10-CM | POA: Diagnosis not present

## 2021-10-30 DIAGNOSIS — K2270 Barrett's esophagus without dysplasia: Secondary | ICD-10-CM

## 2021-10-30 DIAGNOSIS — K269 Duodenal ulcer, unspecified as acute or chronic, without hemorrhage or perforation: Secondary | ICD-10-CM

## 2021-10-30 DIAGNOSIS — D125 Benign neoplasm of sigmoid colon: Secondary | ICD-10-CM | POA: Diagnosis not present

## 2021-10-30 DIAGNOSIS — R197 Diarrhea, unspecified: Secondary | ICD-10-CM

## 2021-10-30 DIAGNOSIS — K2100 Gastro-esophageal reflux disease with esophagitis, without bleeding: Secondary | ICD-10-CM

## 2021-10-30 DIAGNOSIS — D120 Benign neoplasm of cecum: Secondary | ICD-10-CM | POA: Diagnosis not present

## 2021-10-30 DIAGNOSIS — K279 Peptic ulcer, site unspecified, unspecified as acute or chronic, without hemorrhage or perforation: Secondary | ICD-10-CM

## 2021-10-30 DIAGNOSIS — R1310 Dysphagia, unspecified: Secondary | ICD-10-CM

## 2021-10-30 DIAGNOSIS — K5730 Diverticulosis of large intestine without perforation or abscess without bleeding: Secondary | ICD-10-CM

## 2021-10-30 DIAGNOSIS — K2901 Acute gastritis with bleeding: Secondary | ICD-10-CM

## 2021-10-30 DIAGNOSIS — D123 Benign neoplasm of transverse colon: Secondary | ICD-10-CM

## 2021-10-30 DIAGNOSIS — R1114 Bilious vomiting: Secondary | ICD-10-CM

## 2021-10-30 DIAGNOSIS — K219 Gastro-esophageal reflux disease without esophagitis: Secondary | ICD-10-CM

## 2021-10-30 DIAGNOSIS — K529 Noninfective gastroenteritis and colitis, unspecified: Secondary | ICD-10-CM

## 2021-10-30 DIAGNOSIS — Z8639 Personal history of other endocrine, nutritional and metabolic disease: Secondary | ICD-10-CM

## 2021-10-30 LAB — PREALBUMIN: PREALBUMIN: 26 mg/dL (ref 10–36)

## 2021-10-30 MED ORDER — SODIUM CHLORIDE 0.9 % IV SOLN: 500.0000 mL | INTRAVENOUS | Status: DC

## 2021-10-30 MED ORDER — RABEPRAZOLE SODIUM 20 MG PO TBEC: 20.0000 mg | DELAYED_RELEASE_TABLET | Freq: Two times a day (BID) | ORAL | 6 refills | Status: DC

## 2021-10-30 NOTE — Progress Notes (Signed)
VSS, transported to PACU °

## 2021-10-30 NOTE — Progress Notes (Signed)
GASTROENTEROLOGY PROCEDURE H&P NOTE   Primary Care Physician: Elsie Stain, MD  HPI: Maria Murphy is a 66 y.o. female who presents for EGD/Colonoscopy for evaluation of IDA, diarrhea, duodenal stenosis, abdominal pain, previous duodenal ulcer, colon cancer screening.  Past Medical History:  Diagnosis Date   Acute saddle pulmonary embolism without acute cor pulmonale (HCC)    Basal cell carcinoma 01/16/2021   L nose, MOHs 04/14/21   Collagen vascular disease (HCC)    Duodenal ulcer    Essential hypertension, benign 01/24/2015   Family history of ovarian cancer 01/06/2021   Family history of pancreatic cancer 01/06/2021   Family history of uterine cancer 01/06/2021   Foot ulcer (HCC)    GERD (gastroesophageal reflux disease)    Hypertension    Memory loss    Peptic ulcer with perforation (HCC)    Perforated abdominal viscus 04/22/2020   PUD (peptic ulcer disease)    with perforation s/p exploratory laparotomy   Pulmonary embolus ([place]) 04/2020   Skin cancer    Suicidal ideations 08/07/2020   Vision abnormalities    Past Surgical History:  Procedure Laterality Date   BILIARY DILATION  08/21/2021   Procedure: DUODENAL DILATION;  Surgeon: Irving Copas., MD;  Location: [place];  Service: Gastroenterology;;   BIOPSY  11/20/2020   Procedure: BIOPSY;  Surgeon: Irving Copas., MD;  Location: [facility];  Service: Gastroenterology;;   BIOPSY  08/21/2021   Procedure: BIOPSY;  Surgeon: Irving Copas., MD;  Location: [place];  Service: Gastroenterology;;   CENTRAL LINE INSERTION Right 04/22/2020   Procedure: CENTRAL LINE INSERTION;  Surgeon: Virl Cagey, MD;  Location: [facility];  Service: General;  Laterality: Right;   DIAGNOSTIC LAPAROSCOPY     ESOPHAGOGASTRODUODENOSCOPY N/A 11/20/2020   Procedure: ESOPHAGOGASTRODUODENOSCOPY (EGD);  Surgeon: Irving Copas., MD;  Location: [facility];  Service: Gastroenterology;  Laterality:  N/A;   ESOPHAGOGASTRODUODENOSCOPY (EGD) WITH PROPOFOL N/A 07/10/2020   Procedure: ESOPHAGOGASTRODUODENOSCOPY (EGD) WITH PROPOFOL;  Surgeon: Rogene Houston, MD;  [facility];  Service: Endoscopy;  Laterality: N/A;   ESOPHAGOGASTRODUODENOSCOPY (EGD) WITH PROPOFOL N/A 10/18/2020   Procedure: ESOPHAGOGASTRODUODENOSCOPY (EGD) WITH PROPOFOL;  Surgeon: Harvel Quale, MD;  [facility];  Service: Gastroenterology;  Laterality: N/A;   ESOPHAGOGASTRODUODENOSCOPY (EGD) WITH PROPOFOL N/A 08/21/2021   Procedure: ESOPHAGOGASTRODUODENOSCOPY (EGD) WITH PROPOFOL;  Surgeon: Rush Landmark Telford Nab., MD;  Location: [place];  Service: Gastroenterology;  Laterality: N/A;   EUS N/A 11/20/2020   Procedure: UPPER ENDOSCOPIC ULTRASOUND (EUS) LINEAR;  Surgeon: Irving Copas., MD;  Location: [facility];  Service: Gastroenterology;  Laterality: N/A;   EUS  08/21/2021   Procedure: UPPER ENDOSCOPIC ULTRASOUND (EUS) LINEAR;  Surgeon: Irving Copas., MD;  Location: [place];  Service: Gastroenterology;;   FINE NEEDLE ASPIRATION  08/21/2021   Procedure: FINE NEEDLE ASPIRATION (FNA) LINEAR;  Surgeon: Irving Copas., MD;  Location: [place];  Service: Gastroenterology;;   Toniann Ket  04/22/2020   Procedure: Toniann Ket;  Surgeon: Virl Cagey, MD;  Location: [facility];  Service: General;;   IR CATHETER TUBE CHANGE  05/09/2020   LAPAROTOMY N/A 04/22/2020   Procedure: EXPLORATORY LAPAROTOMY;  Surgeon: Virl Cagey, MD;  Location: [facility];  Service: General;  Laterality: N/A;   TONSILLECTOMY     Current Outpatient Medications  Medication Sig Dispense Refill   amLODipine (NORVASC) 10 MG tablet Take 1 tablet (10 mg total) by mouth daily. For BP 90 tablet 3   Ascorbic Acid (VITAMIN  C PO) Take 1 tablet by mouth daily.     citalopram (CELEXA) 40 MG tablet Take 1 tablet (40 mg total) by mouth at bedtime. 90 tablet 2   donepezil (ARICEPT) 10 MG tablet  Take 1 tablet (10 mg total) by mouth in the morning and at bedtime. 180 tablet 0   DULoxetine HCl 40 MG CPEP Take 1 capsule by mouth daily. 60 capsule 4   ferrous sulfate 325 (65 FE) MG tablet Take 325 mg by mouth in the morning.     gabapentin (NEURONTIN) 300 MG capsule Take 1 capsule (300 mg total) by mouth 2 (two) times daily. 60 capsule 6   lactose free nutrition (BOOST) LIQD Take 237 mLs by mouth 2 (two) times daily. Boost Breeze Clear     lansoprazole (PREVACID SOLUTAB) 30 MG disintegrating tablet Place 1 tablet on tongue twice daily and allow to dissolve until particles can be swallowed. Do not swallow whole, break, cut, or chew tablet. 60 tablet 3   loperamide (IMODIUM) 2 MG capsule Take 1 capsule (2 mg total) by mouth 2 (two) times daily as needed for diarrhea or loose stools. 14 capsule 0   promethazine (PHENERGAN) 12.5 MG tablet Take 1 tablet (12.5 mg total) by mouth every 8 (eight) hours as needed for nausea or vomiting. 20 tablet 0   QUEtiapine (SEROQUEL) 50 MG tablet Take 1 tablet (50 mg total) by mouth at bedtime. 90 tablet 1   sucralfate (CARAFATE) 1 GM/10ML suspension Take 10 mLs (1 g total) by mouth 4 (four) times daily -  with meals and at bedtime. 420 mL 2   SUPREP BOWEL PREP KIT 17.5-3.13-1.6 GM/177ML SOLN Take 1 kit by mouth as directed. For colonoscopy prep 354 mL 0   tiZANidine (ZANAFLEX) 4 MG tablet Take 1 tablet (4 mg total) by mouth every 8 (eight) hours as needed for muscle spasms. 30 tablet 0   traMADol (ULTRAM) 50 MG tablet Take 1 tablet (50 mg total) by mouth every 8 (eight) hours as needed. 60 tablet 0   traZODone (DESYREL) 100 MG tablet Take 1 tablet (100 mg total) by mouth at bedtime. 30 tablet 1   acetaminophen (TYLENOL) 500 MG tablet Take 1 tablet (500 mg total) by mouth every 6 (six) hours as needed for mild pain or fever.     carvedilol (COREG) 12.5 MG tablet Take 1 tablet (12.5 mg total) by mouth 2 (two) times daily with a meal. 60 tablet 3   lidocaine  (LIDODERM) 5 % Place 1 patch onto the skin daily. Remove & Discard patch within 12 hours or as directed by MD 30 patch 4   Current Facility-Administered Medications  Medication Dose Route Frequency Provider Last Rate Last Admin   0.9 %  sodium chloride infusion  500 mL Intravenous Continuous Mansouraty, Telford Nab., MD        Current Outpatient Medications:    amLODipine (NORVASC) 10 MG tablet, Take 1 tablet (10 mg total) by mouth daily. For BP, Disp: 90 tablet, Rfl: 3   Ascorbic Acid (VITAMIN C PO), Take 1 tablet by mouth daily., Disp: , Rfl:    citalopram (CELEXA) 40 MG tablet, Take 1 tablet (40 mg total) by mouth at bedtime., Disp: 90 tablet, Rfl: 2   donepezil (ARICEPT) 10 MG tablet, Take 1 tablet (10 mg total) by mouth in the morning and at bedtime., Disp: 180 tablet, Rfl: 0   DULoxetine HCl 40 MG CPEP, Take 1 capsule by mouth daily., Disp: 60 capsule, Rfl: 4  ferrous sulfate 325 (65 FE) MG tablet, Take 325 mg by mouth in the morning., Disp: , Rfl:    gabapentin (NEURONTIN) 300 MG capsule, Take 1 capsule (300 mg total) by mouth 2 (two) times daily., Disp: 60 capsule, Rfl: 6   lactose free nutrition (BOOST) LIQD, Take 237 mLs by mouth 2 (two) times daily. Boost Breeze Clear, Disp: , Rfl:    lansoprazole (PREVACID SOLUTAB) 30 MG disintegrating tablet, Place 1 tablet on tongue twice daily and allow to dissolve until particles can be swallowed. Do not swallow whole, break, cut, or chew tablet., Disp: 60 tablet, Rfl: 3   loperamide (IMODIUM) 2 MG capsule, Take 1 capsule (2 mg total) by mouth 2 (two) times daily as needed for diarrhea or loose stools., Disp: 14 capsule, Rfl: 0   promethazine (PHENERGAN) 12.5 MG tablet, Take 1 tablet (12.5 mg total) by mouth every 8 (eight) hours as needed for nausea or vomiting., Disp: 20 tablet, Rfl: 0   QUEtiapine (SEROQUEL) 50 MG tablet, Take 1 tablet (50 mg total) by mouth at bedtime., Disp: 90 tablet, Rfl: 1   sucralfate (CARAFATE) 1 GM/10ML suspension, Take  10 mLs (1 g total) by mouth 4 (four) times daily -  with meals and at bedtime., Disp: 420 mL, Rfl: 2   SUPREP BOWEL PREP KIT 17.5-3.13-1.6 GM/177ML SOLN, Take 1 kit by mouth as directed. For colonoscopy prep, Disp: 354 mL, Rfl: 0   tiZANidine (ZANAFLEX) 4 MG tablet, Take 1 tablet (4 mg total) by mouth every 8 (eight) hours as needed for muscle spasms., Disp: 30 tablet, Rfl: 0   traMADol (ULTRAM) 50 MG tablet, Take 1 tablet (50 mg total) by mouth every 8 (eight) hours as needed., Disp: 60 tablet, Rfl: 0   traZODone (DESYREL) 100 MG tablet, Take 1 tablet (100 mg total) by mouth at bedtime., Disp: 30 tablet, Rfl: 1   acetaminophen (TYLENOL) 500 MG tablet, Take 1 tablet (500 mg total) by mouth every 6 (six) hours as needed for mild pain or fever., Disp: , Rfl:    carvedilol (COREG) 12.5 MG tablet, Take 1 tablet (12.5 mg total) by mouth 2 (two) times daily with a meal., Disp: 60 tablet, Rfl: 3   lidocaine (LIDODERM) 5 %, Place 1 patch onto the skin daily. Remove & Discard patch within 12 hours or as directed by MD, Disp: 30 patch, Rfl: 4  Current Facility-Administered Medications:    0.9 %  sodium chloride infusion, 500 mL, Intravenous, Continuous, Mansouraty, Telford Nab., MD Allergies  Allergen Reactions   Wheat Extract Swelling   Ketamine Other (See Comments)    Agitation, confusion, hypertension, tachycardia   Family History  Problem Relation Age of Onset   High Cholesterol Mother    Hypertension Mother    Arthritis/Rheumatoid Mother    Pancreatic cancer Mother 24   Alcohol abuse Father    Hypertension Maternal Grandmother    Uterine cancer Maternal Grandmother 11   Ovarian cancer Cousin        before age 63   Colon cancer Neg Hx    Colon polyps Neg Hx    Inflammatory bowel disease Neg Hx    Esophageal cancer Neg Hx    Liver disease Neg Hx    Rectal cancer Neg Hx    Stomach cancer Neg Hx    Social History   Socioeconomic History   Marital status: Widowed    Spouse name: Not on  file   Number of children: 2   Years of education:  Not on file   Highest education level: Not on file  Occupational History   Not on file  Tobacco Use   Smoking status: Never   Smokeless tobacco: Never  Vaping Use   Vaping Use: Never used  Substance and Sexual Activity   Alcohol use: Yes    Alcohol/week: 2.0 - 3.0 standard drinks    Types: 2 - 3 Glasses of wine per week   Drug use: No   Sexual activity: Not Currently    Birth control/protection: Post-menopausal  Other Topics Concern   Not on file  Social History Narrative   Not on file   Social Determinants of Health   Financial Resource Strain: Low Risk    Difficulty of Paying Living Expenses: Not hard at all  Food Insecurity: No Food Insecurity   Worried About Charity fundraiser in the Last Year: Never true   Ran Out of Food in the Last Year: Never true  Transportation Needs: Unmet Transportation Needs   Lack of Transportation (Medical): Yes   Lack of Transportation (Non-Medical): No  Physical Activity: Insufficiently Active   Days of Exercise per Week: 7 days   Minutes of Exercise per Session: 10 min  Stress: Stress Concern Present   Feeling of Stress : Very much  Social Connections: Moderately Integrated   Frequency of Communication with Friends and Family: More than three times a week   Frequency of Social Gatherings with Friends and Family: More than three times a week   Attends Religious Services: More than 4 times per year   Active Member of [employer] or Organizations: Yes   Attends Archivist Meetings: More than 4 times per year   Marital Status: Widowed  Human resources officer Violence: Not At Risk   Fear of Current or Ex-Partner: No   Emotionally Abused: No   Physically Abused: No   Sexually Abused: No    Physical Exam: Today's Vitals   10/30/21 1424  BP: (!) 159/79  Pulse: 60  Temp: 98 F (36.7 C)  TempSrc: Temporal  SpO2: 98%  Weight: 174 lb (78.9 kg)  Height: _0  (1.626 m)   Body mass  index is 29.87 kg/m. GEN: NAD EYE: Sclerae anicteric ENT: MMM CV: Non-tachycardic GI: Soft, TTP throughout abdomen NEURO:  Alert & Oriented x 3  Lab Results: No results for input(s): WBC, HGB, HCT, PLT in the last 72 hours. BMET No results for input(s): NA, K, CL, CO2, GLUCOSE, BUN, CREATININE, CALCIUM in the last 72 hours. LFT No results for input(s): PROT, ALBUMIN, AST, ALT, ALKPHOS, BILITOT, BILIDIR, IBILI in the last 72 hours. PT/INR No results for input(s): LABPROT, INR in the last 72 hours.   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  The risks and benefits of endoscopic evaluation/treatment were discussed with the patient and/or family; these include but are not limited to the risk of perforation, infection, bleeding, missed lesions, lack of diagnosis, severe illness requiring hospitalization, as well as anesthesia and sedation related illnesses.  The patient's history has been reviewed, patient examined, no change in status, and deemed stable for procedure.  The patient and/or family is agreeable to proceed.    Justice Britain, MD [facility] Office # [phone]

## 2021-10-30 NOTE — Telephone Encounter (Signed)
Pt was called and vm was left, Information has been sent to nurse pool.   

## 2021-10-30 NOTE — Progress Notes (Signed)
Pt's states no medical or surgical changes since previsit or office visit. 

## 2021-10-30 NOTE — Telephone Encounter (Signed)
-----   Message from Elsie Stain, MD sent at 10/30/2021 11:57 AM EST ----- ?Let pt know prealbumin is normal ?

## 2021-10-30 NOTE — Op Note (Signed)
[place] ?Patient Name: Maria Murphy ?Procedure Date: 10/30/2021 3:34 PM ?MRN: [id number] ?Endoscopist: Justice Britain , MD ?Age: 66 ?Referring MD:  ?Date of Birth: 19-Nov-1955 ?Gender: Female ?Account #: [id number] ?Procedure:                Colonoscopy ?Indications:              Screening for malignant neoplasm in the colon,  ?                          Incidental - Generalized abdominal pain, Incidental  ?                          - Chronic diarrhea ?Medicines:                Monitored Anesthesia Care ?Procedure:                Pre-Anesthesia Assessment: ?                          - Prior to the procedure, a History and Physical  ?                          was performed, and patient medications and  ?                          allergies were reviewed. The patient's tolerance of  ?                          previous anesthesia was also reviewed. The risks  ?                          and benefits of the procedure and the sedation  ?                          options and risks were discussed with the patient.  ?                          All questions were answered, and informed consent  ?                          was obtained. Prior Anticoagulants: The patient has  ?                          taken no previous anticoagulant or antiplatelet  ?                          agents. ASA Grade Assessment: III - A patient with  ?                          severe systemic disease. After reviewing the risks  ?                          and benefits, the patient was deemed in  ?  satisfactory condition to undergo the procedure. ?                          After obtaining informed consent, the colonoscope  ?                          was passed under direct vision. Throughout the  ?                          procedure, the patient's blood pressure, pulse, and  ?                          oxygen saturations were monitored continuously. The  ?                          Olympus [id number]-H190DL (#[id number])  Colonoscope was  ?                          introduced through the anus and advanced to the the  ?                          cecum, identified by appendiceal orifice and  ?                          ileocecal valve. The colonoscopy was performed  ?                          without difficulty. The patient tolerated the  ?                          procedure. The quality of the bowel preparation was  ?                          adequate. The ileocecal valve, appendiceal orifice,  ?                          and rectum were photographed. ?Scope In: 4:02:02 PM ?Scope Out: 4:17:07 PM ?Scope Withdrawal Time: 0 hours 11 minutes 35 seconds  ?Total Procedure Duration: 0 hours 15 minutes 5 seconds  ?Findings:                 The digital rectal exam findings include  ?                          hemorrhoids. Pertinent negatives include no  ?                          palpable rectal lesions. ?                          Five sessile polyps were found in the sigmoid colon  ?                          (1), hepatic flexure (1), ascending colon (1) and  ?  cecum (2). The polyps were 2 to 6 mm in size. These  ?                          polyps were removed with a cold snare. Resection  ?                          and retrieval were complete. ?                          Multiple small-mouthed diverticula were found in  ?                          the recto-sigmoid colon and sigmoid colon. ?                          Normal mucosa was found in the entire colon  ?                          otherwise. Biopsies for histology were taken with a  ?                          cold forceps from the entire colon for evaluation  ?                          of microscopic colitis. ?                          Non-bleeding non-thrombosed external and internal  ?                          hemorrhoids were found during retroflexion, during  ?                          perianal exam and during digital exam. The  ?                           hemorrhoids were Grade II (internal hemorrhoids  ?                          that prolapse but reduce spontaneously). ?Complications:            No immediate complications. ?Estimated Blood Loss:     Estimated blood loss was minimal. ?Impression:               - Hemorrhoids found on digital rectal exam. ?                          - Five 2 to 6 mm polyps in the sigmoid colon, at  ?                          the hepatic flexure, in the ascending colon and in  ?                          the cecum, removed with a cold snare. Resected and  ?  retrieved. ?                          - Diverticulosis in the recto-sigmoid colon and in  ?                          the sigmoid colon. ?                          - Normal mucosa in the entire examined colon  ?                          otherwise. Biopsied. ?                          - Non-bleeding non-thrombosed external and internal  ?                          hemorrhoids. ?Recommendation:           - The patient will be observed post-procedure,  ?                          until all discharge criteria are met. ?                          - Discharge patient to home. ?                          - Patient has a contact number available for  ?                          emergencies. The signs and symptoms of potential  ?                          delayed complications were discussed with the  ?                          patient. Return to normal activities tomorrow.  ?                          Written discharge instructions were provided to the  ?                          patient. ?                          - High fiber diet. ?                          - Use FiberCon 1-2 tablets PO daily. ?                          - Continue present medications. ?                          - Await pathology results. ?                          -  Repeat colonoscopy for surveillance based on  ?                          pathology results. ?                          - The findings and  recommendations were discussed  ?                          with the patient. ?Justice Britain, MD ?10/30/2021 4:32:12 PM ?

## 2021-10-30 NOTE — Op Note (Signed)
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                          - Prior to the procedure, a History and Physical  ?                          was performed, and patient medications and  ?                          allergies were reviewed. The patient's tolerance of  ?                          previous anesthesia was also reviewed. The risks  ?                          and benefits of the procedure and the sedation  ?                          options and risks were discussed with the patient.  ?                          All questions were answered, and informed consent  ?                          was obtained. Prior Anticoagulants: The patient has  ?                          taken no previous anticoagulant or antiplatelet  ?                          agents. ASA Grade Assessment: III - A patient with  ?                          severe systemic disease. After reviewing the risks  ?                          and benefits, the patient was deemed in  ?                          satisfactory condition to undergo the procedure. ?  After obtaining informed consent, the endoscope was  ?                          passed under direct vision. Throughout the  ?                          procedure, the patient's blood pressure, pulse, and  ?                          oxygen saturations were monitored continuously. The  ?                          Endoscope was introduced through the mouth, and  ?                           advanced to the second part of duodenum. The upper  ?                          GI endoscopy was accomplished without difficulty.  ?                          The patient tolerated the procedure. ?Scope In: ?Scope Out: ?Findings:                 No gross lesions were noted in the proximal  ?                          esophagus and in the mid esophagus. ?                          LA Grade A (one or more mucosal breaks less than 5  ?                          mm, not extending between tops of 2 mucosal folds)  ?                          esophagitis with no bleeding was found in the  ?                          distal esophagus. ?                          The Z-line was irregular and was found 33 cm from  ?                          the incisors. ?                          A 5 cm hiatal hernia was present. ?                          Patchy severe inflammation with hemorrhage  ?                          characterized by  erosions, erythema, friability,  ?                          granularity and shallow ulcerations was found in  ?                          the entire examined stomach. Biopsies were taken  ?                          with a cold forceps for histology and Helicobacter  ?                          pylori testing. ?                          Two non-bleeding cratered gastric ulcers with a  ?                          clean ulcer base (Forrest Class III) were found in  ?                          the gastric antrum. The largest lesion was 13 mm in  ?                          largest dimension. ?                          A deformity was found in the gastric antrum due to  ?                          the ulcers. ?                          One non-bleeding cratered duodenal ulcer with a  ?                          clean ulcer base (Forrest Class III) was found in  ?                          the D1/D2 sweep of the duodenum. The lesion was 16  ?                          mm in largest dimension. ?                          A  mild post-ulcer deformity was found in the D1/D2  ?                          sweep but was easily traversed. The angulation is  ?                          longer than 1 cm however and AXIOS stenting is not  ?  likely to be able to achieve dilation. This is more  ?                          deformed due to active disease that is worse than  ?                          prior in regards to the ulcer present. ?                          No other gross lesions were noted in the second  ?                          portion of the duodenum. ?Complications:            No immediate complications. ?Estimated Blood Loss:     Estimated blood loss was minimal. ?Impression:               - No gross lesions in esophagus proximally. LA  ?                          Grade A esophagitis with no bleeding distally. ?                          - Z-line irregular, 33 cm from the incisors. ?                          - 5 cm hiatal hernia. ?                          - Gastritis with hemorrhage. Biopsied. ?                          - 2 non-bleeding gastric ulcers with a clean ulcer  ?                          base (Forrest Class III). Leads to an acquired  ?                          deformity in the gastric antrum as well. ?                          - Non-bleeding duodenal ulcer with a clean ulcer  ?                          base (Forrest Class III) - this is worse than prior  ?                          since her prior EGD showed ulcer was healing. ?                          - Duodenal deformity distal to the ulcer in  ?                          previous stenosis region.  Easily traversed. Wire  ?                          guided-CRE dilation can be considered again, long  ?                          1.5 cm AXIOS may be possible though not absolute  ?                          and with active inflammation proximal to this may  ?                          be higher risk. ?                          - No gross lesions in the second  portion of the  ?                          duodenum. ?Recommendation:           - Proceed to scheduled colonoscopy. ?                          - Resume previous diet. ?                          - Observe patient's clinical course. ?                          - Await pathology results. ?                          - Transition PPI to Aciphex 20 BID or Dexilant 30  ?                          BID. ?                          - Continue Carafate twice daily. ?                          - Consider Misoprostol use in future. ?                          - Observe patient's clinical course. ?                          - The findings and recommendations were discussed  ?                          with the patient. ?Justice Britain, MD ?10/30/2021 4:28:27 PM ?

## 2021-10-30 NOTE — Patient Instructions (Addendum)
Await pathology ? ?Please read over handouts about polyps, hemorrhoids, diverticulosis, gastritis, hiatal hernias, and high fiber diets ? ?Continue your Carafate twice daily ?Take Fibercon 1-2 tablets daily ? ?Stop Prevacid= start taking Aciphex '20mg'$  twice daily ? ?YOU HAD AN ENDOSCOPIC PROCEDURE TODAY AT [facility]:   Refer to the procedure report that was given to you for any specific questions about what was found during the examination.  If the procedure report does not answer your questions, please call your gastroenterologist to clarify.  If you requested that your care partner not be given the details of your procedure findings, then the procedure report has been included in a sealed envelope for you to review at your convenience later. ? ?YOU SHOULD EXPECT: Some feelings of bloating in the abdomen. Passage of more gas than usual.  Walking can help get rid of the air that was put into your GI tract during the procedure and reduce the bloating. If you had a lower endoscopy (such as a colonoscopy or flexible sigmoidoscopy) you may notice spotting of blood in your stool or on the toilet paper. If you underwent a bowel prep for your procedure, you may not have a normal bowel movement for a few days. ? ?Please Note:  You might notice some irritation and congestion in your nose or some drainage.  This is from the oxygen used during your procedure.  There is no need for concern and it should clear up in a day or so. ? ?SYMPTOMS TO REPORT IMMEDIATELY: ? ?Following lower endoscopy (colonoscopy or flexible sigmoidoscopy): ? Excessive amounts of blood in the stool ? Significant tenderness or worsening of abdominal pains ? Swelling of the abdomen that is new, acute ? Fever of 100?F or higher ? ?Following upper endoscopy (EGD) ? Vomiting of blood or coffee ground material ? New chest pain or pain under the shoulder blades ? Painful or persistently difficult swallowing ? New shortness of breath ? Fever of  100?F or higher ? Black, tarry-looking stools ? ?For urgent or emergent issues, a gastroenterologist can be reached at any hour by calling [phone]. ?Do not use MyChart messaging for urgent concerns.  ? ? ?DIET:  We do recommend a small meal at first, but then you may proceed to your regular diet.  Drink plenty of fluids but you should avoid alcoholic beverages for 24 hours. ? ?ACTIVITY:  You should plan to take it easy for the rest of today and you should NOT DRIVE or use heavy machinery until tomorrow (because of the sedation medicines used during the test).   ? ?FOLLOW UP: ?Our staff will call the number listed on your records 48-72 hours following your procedure to check on you and address any questions or concerns that you may have regarding the information given to you following your procedure. If we do not reach you, we will leave a message.  We will attempt to reach you two times.  During this call, we will ask if you have developed any symptoms of COVID 19. If you develop any symptoms (ie: fever, flu-like symptoms, shortness of breath, cough etc.) before then, please call [phone].  If you test positive for Covid 19 in the 2 weeks post procedure, please call and report this information to [place].   ? ?If any biopsies were taken you will be contacted by phone or by letter within the next 1-3 weeks.  Please call us at [phone] if you have not heard about the biopsies  in 3 weeks.  ? ? ?SIGNATURES/CONFIDENTIALITY: ?You and/or your care partner have signed paperwork which will be entered into your electronic medical record.  These signatures attest to the fact that that the information above on your After Visit Summary has been reviewed and is understood.  Full responsibility of the confidentiality of this discharge information lies with you and/or your care-partner. ? ?

## 2021-10-30 NOTE — Progress Notes (Signed)
1630- pt drowsy, hard to awaken.  Pt will open eyes and c/o abd pain as an "8."  Abdomen soft, easily palpable; pt passing large amount of air. Levsin 0.125 mg SL 2 tablets po given.   ? ?1640 pt smiling, no c/o pain at this time.   ? ?1650- pt awake, no complaints at this time.  Georgeanna Lea RN assisting pt to dress.  Awaiting Dr. Rush Landmark to speak with pt prior to D/C. ?

## 2021-11-03 ENCOUNTER — Telehealth: Payer: Self-pay

## 2021-11-03 NOTE — Telephone Encounter (Signed)
Attempted f/u call. No answer, left VM. 

## 2021-11-04 ENCOUNTER — Other Ambulatory Visit: Payer: Self-pay

## 2021-11-04 ENCOUNTER — Ambulatory Visit (INDEPENDENT_AMBULATORY_CARE_PROVIDER_SITE_OTHER): Payer: Medicare Other | Admitting: Dermatology

## 2021-11-04 ENCOUNTER — Encounter (HOSPITAL_COMMUNITY): Payer: Self-pay | Admitting: Hematology

## 2021-11-04 DIAGNOSIS — Z85828 Personal history of other malignant neoplasm of skin: Secondary | ICD-10-CM

## 2021-11-04 DIAGNOSIS — L820 Inflamed seborrheic keratosis: Secondary | ICD-10-CM | POA: Diagnosis not present

## 2021-11-04 DIAGNOSIS — L578 Other skin changes due to chronic exposure to nonionizing radiation: Secondary | ICD-10-CM | POA: Diagnosis not present

## 2021-11-04 NOTE — Patient Instructions (Signed)

## 2021-11-04 NOTE — Progress Notes (Signed)
? ?  Follow-Up Visit ?  ?Subjective  ?Maria Murphy is a 66 y.o. female who presents for the following: ISK f/u (L lower eyelid margin, R cheek, L sideburn, LN2 last visit/Some on breast) and hx of BCC (L nose, Mohs 08/22). ?The patient has spots, moles and lesions to be evaluated, some may be new or changing and the patient has concerns that these could be cancer. ? ?The following portions of the chart were reviewed this encounter and updated as appropriate:  ? Tobacco  Allergies  Meds  Problems  Med Hx  Surg Hx  Fam Hx   ?  ?Review of Systems:  No other skin or systemic complaints except as noted in HPI or Assessment and Plan. ? ?Objective  ?Well appearing patient in no apparent distress; mood and affect are within normal limits. ? ?A focused examination was performed including face, breast. Relevant physical exam findings are noted in the Assessment and Plan. ? ?Left lat breast x 3, R lat breast x 5 (8) ?Stuck on waxy paps with erythema ? ? ?Assessment & Plan  ? ?History of Basal Cell Carcinoma of the Skin ?- No evidence of recurrence today ?- Recommend regular full body skin exams ?- Recommend daily broad spectrum sunscreen SPF 30+ to sun-exposed areas, reapply every 2 hours as needed.  ?- Call if any new or changing lesions are noted between office visits  ?- L nose ? ?Actinic Damage ?- chronic, secondary to cumulative UV radiation exposure/sun exposure over time ?- diffuse scaly erythematous macules with underlying dyspigmentation ?- Recommend daily broad spectrum sunscreen SPF 30+ to sun-exposed areas, reapply every 2 hours as needed.  ?- Recommend staying in the shade or wearing long sleeves, sun glasses (UVA+UVB protection) and wide brim hats (4-inch brim around the entire circumference of the hat). ?- Call for new or changing lesions.  ? ?Inflamed seborrheic keratosis (8) ?Left lat breast x 3, R lat breast x 5 ? ?Destruction of lesion - Left lat breast x 3, R lat breast x 5 ?Complexity: simple    ?Destruction method: cryotherapy   ?Informed consent: discussed and consent obtained   ?Timeout:  patient name, date of birth, surgical site, and procedure verified ?Lesion destroyed using liquid nitrogen: Yes   ?Region frozen until ice ball extended beyond lesion: Yes   ?Outcome: patient tolerated procedure well with no complications   ?Post-procedure details: wound care instructions given   ? ? ?Return in about 2 months (around 01/04/2022) for TBSE, Hx of BCC, recheck ISKs. ? ?I, Othelia Pulling, RMA, am acting as scribe for Sarina Ser, MD . ?Documentation: I have reviewed the above documentation for accuracy and completeness, and I agree with the above. ? ?Sarina Ser, MD ? ?

## 2021-11-06 ENCOUNTER — Encounter: Payer: Self-pay | Admitting: Gastroenterology

## 2021-11-07 DIAGNOSIS — M25561 Pain in right knee: Secondary | ICD-10-CM | POA: Diagnosis not present

## 2021-11-07 DIAGNOSIS — M25562 Pain in left knee: Secondary | ICD-10-CM | POA: Diagnosis not present

## 2021-11-10 DIAGNOSIS — Z7984 Long term (current) use of oral hypoglycemic drugs: Secondary | ICD-10-CM | POA: Diagnosis not present

## 2021-11-10 DIAGNOSIS — E1136 Type 2 diabetes mellitus with diabetic cataract: Secondary | ICD-10-CM | POA: Diagnosis not present

## 2021-11-10 DIAGNOSIS — H25813 Combined forms of age-related cataract, bilateral: Secondary | ICD-10-CM | POA: Diagnosis not present

## 2021-11-13 ENCOUNTER — Encounter: Payer: Self-pay | Admitting: Dermatology

## 2021-11-13 ENCOUNTER — Encounter: Payer: Self-pay | Admitting: *Deleted

## 2021-11-14 ENCOUNTER — Telehealth: Payer: Self-pay | Admitting: Critical Care Medicine

## 2021-11-14 DIAGNOSIS — E669 Obesity, unspecified: Secondary | ICD-10-CM

## 2021-11-14 NOTE — Telephone Encounter (Signed)
Fyi.

## 2021-11-14 NOTE — Telephone Encounter (Signed)
Copied from [place] [phone]. Topic: Referral - Request for Referral ?>> Nov 14, 2021  2:27 PM Erick Blinks wrote: ?Has patient seen PCP for this complaint? Yes.   ?*If NO, is insurance requiring patient see PCP for this issue before PCP can refer them? ?Referral for which specialty: Nutrition  ?Preferred provider/office: [facility]  ?Reason for referral: Discussed/promised during recent appt ?

## 2021-11-15 NOTE — Telephone Encounter (Signed)
Referral sent 

## 2021-11-17 ENCOUNTER — Telehealth: Payer: Self-pay

## 2021-11-17 NOTE — Telephone Encounter (Signed)
Called pt and left vm  

## 2021-11-17 NOTE — Telephone Encounter (Signed)
Pt gave m,e a call back and is aware of referral for nutritionist. Also had question about medication refill(trazodone). Pt has refills at pharmacy. ? ?

## 2021-11-18 ENCOUNTER — Telehealth: Payer: Self-pay | Admitting: Critical Care Medicine

## 2021-11-18 NOTE — Telephone Encounter (Signed)
Copied from [place] [id number]. Topic: Quick Communication - Rx Refill/Question ?>> Nov 18, 2021  3:26 PM Yvette Rack wrote: ?Pt stated usually her Rx is for 2 tablets but this one was only for 1 tablet and she can not sleep if she only takes 1. ? ?Medication: traZODone (DESYREL) 100 MG tablet ? ?Has the patient contacted their pharmacy? Yes.  Pt told to contact provider ?(Agent: If no, request that the patient contact the pharmacy for the refill. If patient does not wish to contact the pharmacy document the reason why and proceed with request.) ?(Agent: If yes, when and what did the pharmacy advise?) ? ?Preferred Pharmacy (with phone number or street name): [place], [place] [address]  ?Phone: [phone] ?Fax: [phone] ? ?Has the patient been seen for an appointment in the last year OR does the patient have an upcoming appointment? Yes.   ? ?Agent: Please be advised that RX refills may take up to 3 business days. We ask that you follow-up with your pharmacy. ?

## 2021-11-19 NOTE — Telephone Encounter (Signed)
I looked in her history and we have been filling '100mg'$  daily prn for sleep since she has been seeing Dr. Joya Gaskins here. I did note where she was receiving '200mg'$  inpatient. Will need to route to Dr. Joya Gaskins if she is requesting an increase in dose.  ?

## 2021-11-19 NOTE — Telephone Encounter (Signed)
Given her health hx I do not align with a dose increase on trazadone ?

## 2021-11-26 DIAGNOSIS — H25812 Combined forms of age-related cataract, left eye: Secondary | ICD-10-CM | POA: Diagnosis not present

## 2021-11-26 DIAGNOSIS — H5703 Miosis: Secondary | ICD-10-CM | POA: Diagnosis not present

## 2021-11-26 DIAGNOSIS — H25011 Cortical age-related cataract, right eye: Secondary | ICD-10-CM | POA: Diagnosis not present

## 2021-11-26 DIAGNOSIS — H21561 Pupillary abnormality, right eye: Secondary | ICD-10-CM | POA: Diagnosis not present

## 2021-11-26 DIAGNOSIS — H2511 Age-related nuclear cataract, right eye: Secondary | ICD-10-CM | POA: Diagnosis not present

## 2021-12-10 DIAGNOSIS — H25812 Combined forms of age-related cataract, left eye: Secondary | ICD-10-CM | POA: Diagnosis not present

## 2021-12-10 DIAGNOSIS — H2512 Age-related nuclear cataract, left eye: Secondary | ICD-10-CM | POA: Diagnosis not present

## 2021-12-10 DIAGNOSIS — H21562 Pupillary abnormality, left eye: Secondary | ICD-10-CM | POA: Diagnosis not present

## 2021-12-14 NOTE — Progress Notes (Addendum)
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 female with PMH of aortic sclerosis, saddle PE (on Xarelto), HTN, GERD, PUD.  Patient developed bilateral PE with right heart strain in September 2021 following PUD repair.  She was treated with Xarelto for 6 months then developed GI bleed with hypercoagulable state found to be provoked by Xarelto.  No current signs or symptoms of DVT or PE based on CT angio in 2021. Aortic atherosclerosis was noted on CT angio of chest following    Maria Murphy had reported some chronic intermittent palpitations associated with chest pain. She was seen by Dr. Marlou Porch for preop evaluation in 4/22. Patient completed a Lexiscan stress test for GI surgerywhich showed low risk with no ischemia. 2D echo was completed 9/21 with EF 60-65%, normal LV function with no RWMA, moderately elevated PA pressures, RV systolic pressure of 63.8 mmHg.     Since last being seen in our clinic the patient reports doing well.  She presents today for preoperative clearance and HTN follow up.  Her blood pressure today was 156/90 and she reports compliance with her current medication regimen.  She states that she does still feel occasional palpitations that she describes as sharp pain in the left upper sternal region.  These occur at rest or with  exertion and only last for a few seconds.  She states that these occur almost daily.  She is requiring clearance for knee surgery.  She is able to ambulate with a cane without any difficulty.  She denies chest pain, palpitations, dyspnea, PND, orthopnea, nausea, vomiting, dizziness, syncope, edema, weight gain, or early satiety.  Past Medical History    Past Medical History:  Diagnosis Date   Acute saddle pulmonary embolism without acute cor pulmonale (HCC)    Basal cell carcinoma 01/16/2021   L nose, MOHs 04/14/21   Collagen vascular disease (HCC)    Duodenal ulcer    Essential hypertension, benign 01/24/2015   Family history of ovarian cancer 01/06/2021   Family history of pancreatic cancer 01/06/2021   Family history of uterine cancer 01/06/2021   Foot ulcer ([place])    GERD (gastroesophageal reflux disease)    Hypertension    Memory loss    Peptic ulcer with perforation (HCC)    Perforated abdominal viscus 04/22/2020   PUD (peptic ulcer disease)    with perforation s/p exploratory laparotomy   Pulmonary embolus ([place]) 04/2020   Skin cancer    Suicidal ideations 08/07/2020   Vision abnormalities      Allergies  Allergies  Allergen Reactions   Wheat Extract Swelling   Ketamine Other (See Comments)    Agitation, confusion, hypertension, tachycardia    Home Medications    Current Outpatient Medications  Medication Sig Dispense Refill   acetaminophen (TYLENOL) 500 MG tablet Take 1 tablet (500 mg total) by mouth every 6 (  six) hours as needed for mild pain or fever.     amLODipine (NORVASC) 10 MG tablet Take 1 tablet (10 mg total) by mouth daily. For BP 90 tablet 3   Ascorbic Acid (VITAMIN C PO) Take 1 tablet by mouth daily.     carvedilol (COREG) 12.5 MG tablet Take 1 tablet (12.5 mg total) by mouth 2 (two) times daily with a meal. 60 tablet 3   citalopram (CELEXA) 40 MG tablet Take 1 tablet (40 mg total) by mouth at bedtime. 90 tablet 2   donepezil (ARICEPT) 10 MG tablet  Take 1 tablet (10 mg total) by mouth in the morning and at bedtime. 180 tablet 0   DULoxetine HCl 40 MG CPEP Take 1 capsule by mouth daily. 60 capsule 4   ferrous sulfate 325 (65 FE) MG tablet Take 325 mg by mouth in the morning.     gabapentin (NEURONTIN) 300 MG capsule Take 1 capsule (300 mg total) by mouth 2 (two) times daily. 60 capsule 6   lactose free nutrition (BOOST) LIQD Take 237 mLs by mouth 2 (two) times daily. Boost Breeze Clear     lansoprazole (PREVACID SOLUTAB) 30 MG disintegrating tablet Place 1 tablet on tongue twice daily and allow to dissolve until particles can be swallowed. Do not swallow whole, break, cut, or chew tablet. 60 tablet 3   lidocaine (LIDODERM) 5 % Place 1 patch onto the skin daily. Remove & Discard patch within 12 hours or as directed by MD 30 patch 4   lisinopril-hydrochlorothiazide (ZESTORETIC) 10-12.5 MG tablet Take 1 tablet by mouth daily. 90 tablet 1   loperamide (IMODIUM) 2 MG capsule Take 1 capsule (2 mg total) by mouth 2 (two) times daily as needed for diarrhea or loose stools. 14 capsule 0   metoprolol tartrate (LOPRESSOR) 100 MG tablet TAKE 1 TABLET BY MOUTH 2 HOURS PRIOR TO THE CARDIAC CT 1 tablet 0   promethazine (PHENERGAN) 12.5 MG tablet Take 1 tablet (12.5 mg total) by mouth every 8 (eight) hours as needed for nausea or vomiting. 20 tablet 0   QUEtiapine (SEROQUEL) 50 MG tablet Take 1 tablet (50 mg total) by mouth at bedtime. 90 tablet 1   RABEprazole (ACIPHEX) 20 MG tablet Take 1 tablet (20 mg total) by mouth 2 (two) times daily. 60 tablet 6   sucralfate (CARAFATE) 1 GM/10ML suspension Take 10 mLs (1 g total) by mouth 4 (four) times daily -  with meals and at bedtime. 420 mL 2   tiZANidine (ZANAFLEX) 4 MG tablet Take 1 tablet (4 mg total) by mouth every 8 (eight) hours as needed for muscle spasms. 30 tablet 0   traMADol (ULTRAM) 50 MG tablet Take 1 tablet (50 mg total) by mouth every 8 (eight) hours as needed. 60 tablet 0   traZODone (DESYREL) 100 MG  tablet Take 1 tablet (100 mg total) by mouth at bedtime. 30 tablet 1   No current facility-administered medications for this visit.     Review of Systems  Please see the history of present illness.    (+) Bilateral knee pain (+) Upper sternal chest pain  All other systems reviewed and are otherwise negative except as noted above.  Physical Exam    Wt Readings from Last 3 Encounters:  12/15/21 190 lb 6.4 oz (86.4 kg)  10/30/21 174 lb (78.9 kg)  10/29/21 176 lb 9.6 oz (80.1 kg)   VS: Vitals:   12/15/21 1017  BP: (!) 156/90  Pulse: 82  SpO2: 97%  ,  Body mass index is 33.73 kg/m.  Constitutional:      Appearance: Healthy appearance. Not in distress.  Neck:     Vascular: JVD normal.  Pulmonary:     Effort: Pulmonary effort is normal.     Breath sounds: No wheezing. No rales.  Cardiovascular:     Normal rate. Regular rhythm. Normal S1. Normal S2.      Murmurs: There is no murmur.  Edema:    Trace edema in bilateral lower extremities Abdominal:     Palpations: Abdomen is soft. There is no hepatomegaly.  Skin:    General: Skin is warm and dry.  Neurological:     General: No focal deficit present.     Mental Status: Alert and oriented to person, place and time.     Cranial Nerves: Cranial nerves are intact.  EKG/LABS/Other Studies Reviewed    ECG personally reviewed by me today -none completed today   Lab Results  Component Value Date   WBC 6.8 10/08/2021   HGB 12.3 10/08/2021   HCT 36.3 10/08/2021   MCV 90.2 10/08/2021   PLT 187.0 10/08/2021   Lab Results  Component Value Date   CREATININE 0.64 09/24/2021   BUN 14 09/24/2021   NA 135 09/24/2021   K 4.4 09/24/2021   CL 109 09/24/2021   CO2 22 09/24/2021   Lab Results  Component Value Date   ALT 8 09/24/2021   AST 16 09/24/2021   ALKPHOS 44 09/24/2021   BILITOT 0.5 09/24/2021   No results found for: CHOL, HDL, LDLCALC, LDLDIRECT, TRIG, CHOLHDL  Lab Results  Component Value Date   HGBA1C 5.5  10/08/2021    Assessment & Plan     Addendum: Cardiac CTA completed and revealed nonobstructive coronary artery disease. According to ACC/AHA guidelines, no further cardiovascular testing needed.  The patient may proceed to surgery at acceptable risk.    1.  Preoperative evaluation: -Patient presents today for preoperative evaluation and clearance for upcoming knee procedure.  She endorses some chest pain and palpitations with and without exertion. -We will obtain cardiac CTA to evaluate chest pain and palpitations to rule out possible ischemic cause. -Surgical clearance will be granted based on findings of cardiac CTA :[id number]}   Maria Murphy's perioperative risk of a major cardiac event is 0.4% according to the Revised Cardiac Risk Index (RCRI).  Therefore, she is at low risk for perioperative complications.   Her functional capacity is good at 4.31 METs according to the Duke Activity Status Index (DASI). Recommendations: -We will obtain cardiac CTA to evaluate chest pain and palpitations to rule out possible ischemic cause. -Surgical clearance will be granted based on findings of cardiac CTA  Antiplatelet and/or Anticoagulation Recommendations: N/A  2.  Essential hypertension: -Patient's blood pressure today was 156/90 -We will start Zestoretic 10-12.5 mg daily -Continue amlodipine 10 mg daily and carvedilol 12.5 mg daily -Patient will record blood pressures for 2 weeks and report findings.  3.  Palpitations/atypical chest pain: -Patient states that she has complaint of sharp upper sternal chest pain that occurs with and without rest. -BMET today -Metoprolol 100 mg daily to be taken morning of procedure with Coreg 12.5  Disposition: Follow-up with None or APP in 6 months    Medication Adjustments/Labs and Tests Ordered: Current medicines are reviewed at length with the patient today.  Concerns regarding medicines are outlined above.  Tests Ordered: Orders Placed This  Encounter  Procedures   CT CORONARY MORPH W/CTA COR W/SCORE W/CA W/CM &/OR  WO/CM   Basic metabolic panel   Medication Changes: Meds ordered this encounter  Medications   metoprolol tartrate (LOPRESSOR) 100 MG tablet    Sig: TAKE 1 TABLET BY MOUTH 2 HOURS PRIOR TO THE CARDIAC CT    Dispense:  1 tablet    Refill:  0   lisinopril-hydrochlorothiazide (ZESTORETIC) 10-12.5 MG tablet    Sig: Take 1 tablet by mouth daily.    Dispense:  90 tablet    Refill:  1    Signed, Mable Fill, Marissa Nestle, NP 12/15/2021, 12:48 PM [place]

## 2021-12-15 ENCOUNTER — Encounter: Payer: Self-pay | Admitting: Nurse Practitioner

## 2021-12-15 ENCOUNTER — Ambulatory Visit (INDEPENDENT_AMBULATORY_CARE_PROVIDER_SITE_OTHER): Payer: Medicare Other | Admitting: Nurse Practitioner

## 2021-12-15 VITALS — BP 156/90 | HR 82 | Ht 63.0 in | Wt 190.4 lb

## 2021-12-15 DIAGNOSIS — I10 Essential (primary) hypertension: Secondary | ICD-10-CM | POA: Diagnosis not present

## 2021-12-15 DIAGNOSIS — I700 Atherosclerosis of aorta: Secondary | ICD-10-CM

## 2021-12-15 DIAGNOSIS — Z01818 Encounter for other preprocedural examination: Secondary | ICD-10-CM

## 2021-12-15 DIAGNOSIS — R079 Chest pain, unspecified: Secondary | ICD-10-CM

## 2021-12-15 DIAGNOSIS — I200 Unstable angina: Secondary | ICD-10-CM | POA: Diagnosis not present

## 2021-12-15 LAB — BASIC METABOLIC PANEL
BUN/Creatinine Ratio: 22 (ref 12–28)
BUN: 16 mg/dL (ref 8–27)
CO2: 24 mmol/L (ref 20–29)
Calcium: 9.3 mg/dL (ref 8.7–10.3)
Chloride: 106 mmol/L (ref 96–106)
Creatinine, Ser: 0.74 mg/dL (ref 0.57–1.00)
Glucose: 105 mg/dL — ABNORMAL HIGH (ref 70–99)
Potassium: 4.3 mmol/L (ref 3.5–5.2)
Sodium: 143 mmol/L (ref 134–144)
eGFR: 90 mL/min/{1.73_m2} (ref >59)

## 2021-12-15 MED ORDER — METOPROLOL TARTRATE 100 MG PO TABS: ORAL_TABLET | ORAL | 0 refills | Status: DC

## 2021-12-15 MED ORDER — LISINOPRIL-HYDROCHLOROTHIAZIDE 10-12.5 MG PO TABS: 1.0000 | ORAL_TABLET | Freq: Every day | ORAL | 1 refills | Status: AC

## 2021-12-15 NOTE — Patient Instructions (Addendum)
Medication Instructions:  ?Your physician has recommended you make the following change in your medication:  ? START Lisinopril-HCTZ 10-12.5 mg taking 1 daily  ? ?*If you need a refill on your cardiac medications before your next appointment, please call your pharmacy* ? ? ?Lab Work: ?TODAY:  BMET ? ?If you have labs (blood work) drawn today and your tests are completely normal, you will receive your results only by: ?MyChart Message (if you have MyChart) OR ?A paper copy in the mail ?If you have any lab test that is abnormal or we need to change your treatment, we will call you to review the results. ? ? ?Testing/Procedures: ?Your physician has requested that you have cardiac CT. Cardiac computed tomography (CT) is a painless test that uses an x-ray machine to take clear, detailed pictures of your heart. For further information please visit [email or web address]. Please follow instruction sheet BELOW: ? ? ? ?Your cardiac CT will be scheduled at one of the below locations:  ? ?[facility] ?[address] ?[place], [place] [address] ?[phone] ? ?At [facility], please arrive at the [facility]'s Entrance (Entrance C2) of [facility] 30 minutes prior to test start time. ?You can use the FREE valet parking offered at entrance C (encouraged to control the heart rate for the test)  ?Proceed to the [facility] Radiology Department (first floor) to check-in and test prep. ? ?All radiology patients and guests should use entrance C2 at [facility], accessed from [facility], even though the hospital's physical address listed is [address]. ? ? ? ?Please follow these instructions carefully (unless otherwise directed): ? ?On the Night Before the Test: ?Be sure to Drink plenty of water. ?Do not consume any caffeinated/decaffeinated beverages or chocolate 12 hours prior to your test. ?Do not take any antihistamines 12 hours prior to your test. ? ? ?On the  Day of the Test: ?Drink plenty of water until 1 hour prior to the test. ?Do not eat any food 4 hours prior to the test. ?You may take your regular medications prior to the test.  ?Take metoprolol (Lopressor) 100 MG two hours prior to test. (This has been sent to [facility]) ?HOLD Lisinopril/Hydrochlorothiazide morning of the test. ?FEMALES- please wear underwire-free bra if available, avoid dresses & tight clothing ? ?    ?After the Test: ?Drink plenty of water. ?After receiving IV contrast, you may experience a mild flushed feeling. This is normal. ?On occasion, you may experience a mild rash up to 24 hours after the test. This is not dangerous. If this occurs, you can take Benadryl 25 mg and increase your fluid intake. ?If you experience trouble breathing, this can be serious. If it is severe call 911 IMMEDIATELY. If it is mild, please call our office. ? ? ?We will call to schedule your test 2-4 weeks out understanding that some insurance companies will need an authorization prior to the service being performed.  ? ?For non-scheduling related questions, please contact the cardiac imaging nurse navigator should you have any questions/concerns: ?Marchia Bond, Cardiac Imaging Nurse Navigator ?Gordy Clement, Cardiac Imaging Nurse Navigator ? [facility] ?Direct Office Dial: [phone]  ? ?For scheduling needs, including cancellations and rescheduling, please call [place], [phone]. ? ? ? ? ? ?Follow-Up: ?At [facility], you and your health needs are our priority.  As part of our continuing mission to provide you with exceptional heart care, we have created designated Provider Care  Teams.  These Care Teams include your primary Cardiologist (physician) and Advanced Practice Providers (APPs -  Physician Assistants and Nurse Practitioners) who all work together to provide you with the care you need, when you need it. ? ?We recommend signing up for the patient portal called "MyChart".   Sign up information is provided on this After Visit Summary.  MyChart is used to connect with patients for Virtual Visits (Telemedicine).  Patients are able to view lab/test results, encounter notes, upcoming appointments, etc.  Non-urgent messages can be sent to your provider as well.   ?To learn more about what you can do with MyChart, go to [email or web address].   ? ?Your next appointment:   ?6 month(s) ? ?The format for your next appointment:   ?In Person ? ?Provider:   ?Candee Furbish, MD  ? ? ?Other Instructions ? ?Your physician has requested that you regularly monitor and record your blood pressure readings at home. Please use the same machine at the same time of day to check your readings and record them to bring to your follow-up visit. ?  ?Please monitor blood pressures and keep a log of your readings for 2 weeks then  send [place] the reading via mychart or call the office.  ?  ?Make sure to check 2 hours after your medications.  ?  ?AVOID these things for 30 minutes before checking your blood pressure: ?No Drinking caffeine. ?No Drinking alcohol. ?No Eating. ?No Smoking. ?No Exercising. ?  ?Five minutes before checking your blood pressure: ?Pee. ?Sit in a dining chair. Avoid sitting in a soft couch or armchair. ?Be quiet. Do not talk ? ?Important Information About Sugar ? ? ? ? ?  ?

## 2022-01-14 ENCOUNTER — Ambulatory Visit (INDEPENDENT_AMBULATORY_CARE_PROVIDER_SITE_OTHER): Payer: Medicare Other | Admitting: Dermatology

## 2022-01-14 DIAGNOSIS — L578 Other skin changes due to chronic exposure to nonionizing radiation: Secondary | ICD-10-CM | POA: Diagnosis not present

## 2022-01-14 DIAGNOSIS — D1800 Hemangioma unspecified site: Secondary | ICD-10-CM | POA: Diagnosis not present

## 2022-01-14 DIAGNOSIS — D229 Melanocytic nevi, unspecified: Secondary | ICD-10-CM | POA: Diagnosis not present

## 2022-01-14 DIAGNOSIS — L905 Scar conditions and fibrosis of skin: Secondary | ICD-10-CM | POA: Diagnosis not present

## 2022-01-14 DIAGNOSIS — L988 Other specified disorders of the skin and subcutaneous tissue: Secondary | ICD-10-CM | POA: Diagnosis not present

## 2022-01-14 DIAGNOSIS — Z85828 Personal history of other malignant neoplasm of skin: Secondary | ICD-10-CM

## 2022-01-14 DIAGNOSIS — L700 Acne vulgaris: Secondary | ICD-10-CM | POA: Diagnosis not present

## 2022-01-14 DIAGNOSIS — L821 Other seborrheic keratosis: Secondary | ICD-10-CM | POA: Diagnosis not present

## 2022-01-14 DIAGNOSIS — Z1283 Encounter for screening for malignant neoplasm of skin: Secondary | ICD-10-CM | POA: Diagnosis not present

## 2022-01-14 DIAGNOSIS — D485 Neoplasm of uncertain behavior of skin: Secondary | ICD-10-CM | POA: Diagnosis not present

## 2022-01-14 DIAGNOSIS — L814 Other melanin hyperpigmentation: Secondary | ICD-10-CM

## 2022-01-14 MED ORDER — TRETINOIN 0.025 % EX CREA: TOPICAL_CREAM | CUTANEOUS | 3 refills | Status: AC

## 2022-01-14 NOTE — Patient Instructions (Addendum)

## 2022-01-14 NOTE — Progress Notes (Signed)
Follow-Up Visit   Subjective  Maria Murphy is a 66 y.o. female who presents for the following: Annual Exam. The patient presents for Total-Body Skin Exam (TBSE) for skin cancer screening and mole check.  The patient has spots, moles and lesions to be evaluated, some may be new or changing.  The following portions of the chart were reviewed this encounter and updated as appropriate:   Tobacco  Allergies  Meds  Problems  Med Hx  Surg Hx  Fam Hx     Review of Systems:  No other skin or systemic complaints except as noted in HPI or Assessment and Plan.  Objective  Well appearing patient in no apparent distress; mood and affect are within normal limits.  A full examination was performed including scalp, head, eyes, ears, nose, lips, neck, chest, axillae, abdomen, back, buttocks, bilateral upper extremities, bilateral lower extremities, hands, feet, fingers, toes, fingernails, and toenails. All findings within normal limits unless otherwise noted below.  Perioral x 2 Comedones.  Face Rhytides and volume loss.   L sup lat scapula 0.5 cm irregular brown macule.   L med ankle Dyspigmented smooth macule or patch.    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 Condition is symptomatic / bothersome to patient. Not to goal. Discussed non prescription Differin 0.1% gel vs Tretinoin 0.025% cream QHS. Patient prefers Tretinoin 0.025% cream.   tretinoin (RETIN-A) 0.025 % cream - Perioral x 2 Apply a pea sized amount to the entire face QHS.  Elastosis of skin Face Recommend one syringe of Restylane Defyne to the B/L nasolabial areas.  Neoplasm of uncertain behavior of skin L sup lat scapula Epidermal / dermal shaving  Lesion diameter (cm):  0.5 Informed consent: discussed and consent obtained   Timeout: patient name, date of birth, surgical site, and procedure verified   Procedure prep:   Patient was prepped and draped in usual sterile fashion Prep type:  Isopropyl alcohol Anesthesia: the lesion was anesthetized in a standard fashion   Anesthetic:  1% lidocaine w/ epinephrine 1-100,000 buffered w/ 8.4% NaHCO3 Instrument used: flexible razor blade   Hemostasis achieved with: pressure, aluminum chloride and electrodesiccation   Outcome: patient tolerated procedure well   Post-procedure details: sterile dressing applied and wound care instructions given   Dressing type: bandage and petrolatum    Specimen 1 - Surgical pathology Differential Diagnosis: D48.5 r/o dysplastic nevus Check Margins: No  Scar conditions and fibrosis of skin L med ankle Recommend Serica moisturizing scar formula cream every night or [employer] brand or Mederma silicone scar sheet every night for the first year after a scar appears to help with scar remodeling if desired. Scars remodel on their own for a full year and will gradually improve in appearance over time.  Skin cancer screening  Lentigines - Scattered tan macules - Due to sun exposure - Benign-appearing, observe - Recommend daily broad spectrum sunscreen SPF 30+ to sun-exposed areas, reapply every 2 hours as needed. - Call for any changes  Seborrheic Keratoses - Stuck-on, waxy, tan-brown papules and/or plaques  - Benign-appearing - Discussed benign etiology and prognosis. - Observe - Call for any changes  Melanocytic Nevi - Tan-brown and/or pink-flesh-colored symmetric macules and papules - Benign appearing on exam today - Observation - Call clinic for new or changing moles - Recommend daily use of broad spectrum spf 30+ sunscreen to sun-exposed areas.   Hemangiomas - Red papules - Discussed  benign nature - Observe - Call for any changes  Actinic Damage - Chronic condition, secondary to cumulative UV/sun exposure - diffuse scaly erythematous macules with underlying dyspigmentation - Recommend daily broad spectrum sunscreen  SPF 30+ to sun-exposed areas, reapply every 2 hours as needed.  - Staying in the shade or wearing long sleeves, sun glasses (UVA+UVB protection) and wide brim hats (4-inch brim around the entire circumference of the hat) are also recommended for sun protection.  - Call for new or changing lesions.  History of Basal Cell Carcinoma of the Skin - No evidence of recurrence today - Recommend regular full body skin exams - Recommend daily broad spectrum sunscreen SPF 30+ to sun-exposed areas, reapply every 2 hours as needed.  - Call if any new or changing lesions are noted between office visits  Skin cancer screening performed today.  Return in about 1 year (around 01/15/2023) for TBSE; 2 mths for filler.  Luther Redo, CMA, am acting as scribe for Sarina Ser, MD . Documentation: I have reviewed the above documentation for accuracy and completeness, and I agree with the above.  Sarina Ser, MD

## 2022-01-19 ENCOUNTER — Encounter: Payer: Self-pay | Admitting: Dermatology

## 2022-01-20 ENCOUNTER — Telehealth: Payer: Self-pay

## 2022-01-20 NOTE — Telephone Encounter (Signed)
-----   Message from Ralene Bathe, MD sent at 01/15/2022  4:39 PM EDT ----- Diagnosis Skin , left sup lat scapula LENTIGO  Benign freckle No further treatment needed

## 2022-01-20 NOTE — Telephone Encounter (Signed)
Advised pt of bx result/sh ?

## 2022-01-21 ENCOUNTER — Encounter (INDEPENDENT_AMBULATORY_CARE_PROVIDER_SITE_OTHER): Payer: Self-pay

## 2022-01-21 DIAGNOSIS — Z1159 Encounter for screening for other viral diseases: Secondary | ICD-10-CM | POA: Diagnosis not present

## 2022-01-21 DIAGNOSIS — G629 Polyneuropathy, unspecified: Secondary | ICD-10-CM | POA: Diagnosis not present

## 2022-01-21 DIAGNOSIS — I10 Essential (primary) hypertension: Secondary | ICD-10-CM | POA: Diagnosis not present

## 2022-01-21 DIAGNOSIS — Z79899 Other long term (current) drug therapy: Secondary | ICD-10-CM | POA: Diagnosis not present

## 2022-01-21 DIAGNOSIS — D509 Iron deficiency anemia, unspecified: Secondary | ICD-10-CM | POA: Diagnosis not present

## 2022-01-21 DIAGNOSIS — I700 Atherosclerosis of aorta: Secondary | ICD-10-CM | POA: Diagnosis not present

## 2022-01-21 DIAGNOSIS — K219 Gastro-esophageal reflux disease without esophagitis: Secondary | ICD-10-CM | POA: Diagnosis not present

## 2022-01-21 DIAGNOSIS — E559 Vitamin D deficiency, unspecified: Secondary | ICD-10-CM | POA: Diagnosis not present

## 2022-01-21 DIAGNOSIS — D492 Neoplasm of unspecified behavior of bone, soft tissue, and skin: Secondary | ICD-10-CM | POA: Diagnosis not present

## 2022-01-21 DIAGNOSIS — K8020 Calculus of gallbladder without cholecystitis without obstruction: Secondary | ICD-10-CM | POA: Diagnosis not present

## 2022-01-21 NOTE — Progress Notes (Deleted)
Established Patient Office Visit  Subjective:  Patient ID: Taresa Montville, female    DOB: 06/25/1956  Age: 66 y.o. MRN: [id number]  CC:  No chief complaint on file.   HPI  08/20/21 Zandra Abts presents for primary care follow-up.  Patient is about to undergo urgent gastroenterology evaluation in the morning for her duodenal obstruction.  She still has episodes of nausea.  Patient's depression still is not well controlled she still has somewhat disorganized thoughts of suicide which is a chronic condition.  She is never taking action on this.  Patient declined a tetanus vaccine and was offered.  She had a total knee replacement on the left canceled because of her gastrointestinal issues.  On arrival blood pressure is elevated 165/97.  She states at home she has had lower numbers.  The patient is enrolling in [place] healthcare sponsored elderly program 2 days a week.  Patient has no other complaints.   01/21/2022 Zandra Abts presents for preop evaluation for planned right total knee replacement.  Patient's orthopedic surgeon is Fredonia Highland.  Patient has previously been followed by hematology and is now off anticoagulants for prior pulmonary embolism.  Patient's received 2 iron infusions during the month of January.  Iron levels have been improving.  She does not absorb iron well because of her gastrointestinal tract and she also has chronic blood loss from the gastric duodenal areas.  She has had a complete evaluation by gastroenterology.  See documentation below.  Below is documentation from the February hematology visit Has seen Hematology:09/29/21 At today's visit, she reports feeling fair.  She apparently tripped and fell 12 days ago and dislocated her right shoulder.  She also had some bruising to her ribs and hip.  She is recovering slowly at home, but is still having some general soreness and residual fatigue after her fall.   She denies any current signs or symptoms of blood loss, no  gross rectal hemorrhage or melena. She has some chest wall pain secondary to fall 2 weeks ago, but denies any anginal type chest pain. She reports new dyspnea on exertion, as well as recurrent dizziness, lightheadedness, and headache.  She denies any syncope, pica, or restless leg symptoms. She continues to take iron supplementation at home. She denies any unilateral leg swelling, but does report that her bilateral peripheral edema is worse than usual, and that she is now waking up with swollen legs in the morning.   She continues to follow-up with gastroenterology (Dr. Rush Landmark) for her many GI issues.  She is eating better after esophageal dilation in December, but continues to have significant abdominal cramps after eating.  Her weight is stable, although she had previously lost about 50 pounds in a year due to her inability to tolerate adequate oral intake.  She reports that her energy is 50% with 100% appetite.  She has been maintaining a stable weight at this time.  ASSESSMENT & PLAN: 1.  History of bilateral pulmonary embolism - Bilateral PE with right heart strain in September 2021, which occurred during ulcer repair which suggests likely provoked episode - Patient was on Xarelto for close to 6 months, then had GI bleeding - hence anticoagulation was held and hypercoagulable work-up was ordered - Hypercoagulable work-up in March 2022 was negative - Most recent CT with PE protocol was negative - This was single episode of likely provoked PE, although it was high burden PE causing right heart strain; she has GI ulcers that intermittently bleeding; therefore, do not  recommend long-term anticoagulation at this time - No current signs or symptoms of DVT or PE  - PLAN: No intervention or anticoagulation recommended at this time.  If patient has another episode of DVT/PE in the future, we will discuss with her again any further recommendations or changes to the above plan   2.  Iron deficiency  anemia - Patient had GI bleed in March 2022 while on anticoagulation for pulmonary embolism, received PRBC transfusion x1 - She has longstanding history of peptic ulcers that intermittently bleed - EGD 10/18/2020 showed erosive gastropathy with gastric ulcer and duodenal ulcer without any current stigmata of bleeding - EGD 08/21/2021: Friable gastric mucosa, gastritis with patchy mild inflammation characterized by erosions, erythema, and friability; duodenal scar with healing ulcer improved but still with active inflammation noted - Work-up for anemia initiated at last visit in April 2022 showed Hgb 10.6, ferritin 5, iron saturation 6%; vitamin B12, folate, LDH, SPEP/light chains normal - Suspect that she had persistent anemia and iron deficiency secondary to GI bleed in March 2022 - She continues to follow with GI (Dr. Rush Landmark) - She received IV iron with Venofer x5 from 12/31/2020 through 01/17/2021 - She continues to take oral ferrous sulfate 325 mg daily at home.     - She denies any current signs or symptoms of gross rectal hemorrhage or melena, has mildly dark stool secondary to oral iron supplementation    - She is symptomatic with fatigue, dyspnea on exertion, lightheadedness, headache - Most recent labs (09/22/2021): Hgb 12.8/MCV 90.5, ferritin 18, iron saturation 16%, TIBC 451 - PLAN: Recommend IV iron with Feraheme x2  - Continue ferrous sulfate daily at home. - Repeat CBC and iron panel in 4 months with office visit. - Continue GI follow-up for intermittent GI bleeding and peptic ulcer disease   3.  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 Charlynne Cousins, we dont have any information that she has pancreatic cancer at this time. - Patient's mother had pancreatic cancer at age 18 - She was referred for genetic counseling (see note from genetic counselor dated 01/15/2021) - PLAN: Continue follow-up as  above   4.  Palpitations and dyspnea on exertion - Patient reports that she feels her heart go "out of rhythm" - She reports increased dyspnea on exertion and bilateral lower extremity edema - Breath sounds markedly diminished at bases on exam -Referral was previously sent to cardiologist, but she never heard back from them - PLAN: Patient encouraged to follow-up with PCP for work-up of the above symptoms and for possible referral to cardiology. - Patient educated on alarm symptoms that would prompt immediate medical attention at the ED.   Note patient continues to have intermittent palpitations.  Hemoglobin levels are normal in this status.  Hematology wanted a cardiology referral they try to get her into cardiology in [place] but was not able to achieve an appointment in September.  She will need cardiology clearance prior to any knee replacement.  Below is documentation from the gastroenterology visit in February and note patient is planning upcoming colonoscopy in the next 24 hours.  She also will have another stent placed in the duodenum.  10/08/21 GI: Annete Ayuso is a 66 y.o. female with a pmh significant for hypertension, anxiety, MDD, prior VTE/PE, chronic abdominal pain, family history pancreas cancer (mother), gastroduodenitis, PUD leading to perforation with exploratory laparotomy and patching, duodenal stenosis, Barrett's esophagus, pancreatic cyst (  low CEA and mild amylase elevation so not MCN or IPMN), chronic abdominal pain.  The patient presents to the [facility] for an evaluation and management of problem(s) noted below:   Problem List 1. Pancreatic cyst   2. Abdominal pain, chronic, epigastric   3. Duodenal stenosis   4. Peptic ulcer disease   5. Barrett's esophagus without dysplasia   6. History of iron deficiency   7. Chronic diarrhea   8. Incontinence of feces, unspecified fecal incontinence type   9. Bilious vomiting with nausea   10. Fever,  unspecified fever cause       History of Present Illness This is a patient who was previously been evaluated in the [place] office.  She is transitioning her care to [place].  She is a patient who has had significant ulcer disease in the past leading to perforation requiring patching.  She had a significant chronic ulcer that persisted over the course of the last few years.  She developed a duodenal stenosis as a result of this.  She also had a pancreatic cyst of an unclear etiology that was noted.  We could not initially perform an EUS due to the significant chronic ulceration but on most recent EUS attempt, we were able to see the cyst and take fluid.  Cyst fluid analysis was documented in the chart but did not suggest IPMN or MCN.  There was concern about potential bilious fluid within that collection, but a bile could never be added to the fluid specimen even after multiple tries.  Repeat imaging was performed that shows a pancreas cyst had redeveloped.  Today, the patient states that after her EUS in December with cyst aspiration as well as duodenal stricture dilation that she was doing better for quite a few weeks.  She was eating more.  Food was not feeling as if it was staying in the stomach too long.  However she still had days where she did have her chronic abdominal pain.  Thanks progressed in the course the last 48 hours where she had more significant nausea and vomiting.  Vomiting did help with her symptoms.  The patient also tells me that she has had chronic diarrhea for the last 2 years as well as significant fecal urgency.  She has had episodes of fecal incontinence as well.  She states that she had a previous Cologuard done within the last 2 to 3 years and that it was negative.  She chose that method of colon cancer screening because she wanted to minimize colonoscopy if possible but this predated the symptoms of her chronic diarrhea.  About a year ago she did have an episode of  bright red blood per rectum with her diarrhea but that has not recurred.  There is no family history of colorectal cancer.  Patient takes Imodium on a relatively frequent basis due to her diarrhea and it does slow things down to allow her to be functional but she is always worried about incontinence occurring or not getting to the restroom quickly enough.  Within the last month she was in an accident and had further worsening of her right shoulder rotator cuff.  She was actually prescribed ibuprofen and naproxen.  Over the course the last few weeks she has been taking naproxen also to help with fevers of unclear etiology as well as pain and she believes that since the initiation of the naproxen her abdominal pain had worsened and she did not know that naproxen was in  NSAID which she knew she should not be taking.  The patient is receiving further IV iron infusions with her next 1 later this week.  She does have a history of iron deficiency anemia of unclear etiology but she is followed by the hematology group.    GI Procedures and Studies  December 2022 EUS EGD Impression: - No gross lesions in esophagus proximally. Salmon-colored mucosa suspicious for Barrett's esophagus distally - biopsied. - Z-line irregular, 36 cm from the incisors. - 3 cm hiatal hernia. - Friable gastric mucosa at the GE Jxn/cardia within [facility] - biopsied. - Gastritis - biopsied. - Duodenal scar with healing ulcer significantly improved from earlier this year but still active inflammation noted.. 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 This was able to be traversed with the adult endoscope initially and then dilated to 15 mm. - No gross lesions in the second portion of the duodenum. EUS Impression: - A cystic lesion was seen in the pancreatic head and genu of the pancreas. Tissue was obtained from this exam, and results are pending. However, the endosonographic appearance is suggestive of benign inflammatory changes. Fine  needle aspiration for fluid performed. - There was no sign of significant pathology parenchyma or ducts within the pancreatic head, genu of the pancreas, pancreatic body and pancreatic tail. - Multiple stones were visualized endosonographically in the gallbladder. - There was no sign of significant pathology in the common bile duct and in the common hepatic duct. - No malignant-appearing lymph nodes were visualized in the celiac region (level 20), peripancreatic region and porta hepatis region.   Pathology FINAL MICROSCOPIC DIAGNOSIS:  A. ESOPHAGUS, DISTAL, BIOPSY:  -  Squamous and glandular epithelium with acute and chronic inflammation  -  Intestinal metaplasia present  -  No dysplasia or malignancy identified  -  See comment  B. GE JUNCTION, BIOPSY:  -  Minute fragments of squamocolumnar junction with chronic inflammation  -  No intestinal metaplasia, dysplasia or malignancy identified  C. STOMACH, BIOPSY:  -  Benign gastric mucosa with features consistent with proton pump inhibitor effect  -  No H. pylori, intestinal metaplasia or malignancy identified  COMMENT:  A.  With the proper endoscopic findings this is diagnostic of Barrett esophagus.    Laboratory Studies  Reviewed those in epic   Imaging Studies  February 2023 CT abdomen pelvis with and without contrast IMPRESSION: 1. Indeterminate complex 4.0 cm cystic mass in the anterior pancreatic head with coarse heterogeneous internal and peripheral calcifications and borderline thickened enhancing internal septations, not appreciably changed in size from baseline CT abdomen/pelvis study 04/20/2020 CT abdomen/pelvis study. Mucinous cystic pancreatic neoplasm remains on the differential. No significant pancreatic duct dilatation. EUS/FNA remains a suggested management consideration if not previously performed. Continued imaging surveillance recommended in 12 months at a minimum, either MRI (preferred) or CT abdomen without and with IV  contrast. 2. Cholelithiasis. No biliary ductal dilatation. 3. Marked sigmoid diverticulosis. 4. Chronic enlarged myomatous uterus. 5. Chronic Bosniak category 1 and category 2 renal cysts. 6. Aortic Atherosclerosis (ICD10-I70.0).   January 2023 CT chest abdomen pelvis IMPRESSION: 1. Anterior dislocation of the right shoulder. 2. Stable 5 mm noncalcified right lower lobe lung nodule. This is seen on the prior studies dated December 14, 2020 and 11/19/20, it is not clearly identified on the May 01, 2020 exam. No follow-up needed if patient is low-risk. Non-contrast chest CT can be considered in 12 months if patient is high-risk. This recommendation follows the consensus  statement: Guidelines for Management of Incidental Pulmonary Nodules Detected on CT Images: From the Fleischner Society 2017; Radiology 2017; 284:228-243. 3. Cholelithiasis without evidence of acute cholecystitis. 4. Bilateral calcified and noncalcified renal cysts. 5. Sigmoid diverticulosis. 6. Multiple partially calcified and noncalcified uterine fibroids.     ASSESSMENT  Ms. Matzke is a 66 y.o. 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  The patient is seen today for evaluation and management of:   1. Pancreatic cyst   2. Abdominal pain, chronic, epigastric   3. Duodenal stenosis   4. Peptic ulcer disease   5. Barrett's esophagus without dysplasia   6. History of iron deficiency   7. Chronic diarrhea   8. Incontinence of feces, unspecified fecal incontinence type   9. Bilious vomiting with nausea   10. Fever, unspecified fever cause     The patient is hemodynamically stable today.   Clinically, she had been doing better after her recent EUS and cyst aspiration and duodenal stricture dilation but seems to have had some issues recur in the last few days.  She is also been taking NSAIDs which she was unaware naproxen was and so that has potentially exacerbated some symptoms as well.  At this point of asked her to stop taking naproxen.  She will continue her PPI to twice daily therapy.  She will continue taking Carafate therapy taking it between 2-4 times per day and I recommend increasing it up to 4 times per day for the next few weeks since she has had increased symptoms.  She is going to continue taking oral iron as well as receive her iron infusions.  Due to her history of previous iron deficiency and anemia of unclear etiology, I think it is reasonable to pursue a colonoscopy.  She also has issues of chronic diarrhea that we are going to explore with stool studies but we will also want to rule out issues of microscopic/collagenous colitis and thus performing a colonoscopy as well for that reason/indication will be helpful.  Due to the patient's improvement after duodenal stricture dilation, I think it is reasonable for [place] to try and do a repeat dilation.  It is possible she may require a cold AXIOS 15 mm x 15 mm stent to further dilate or stretch things depending on how she is doing but time will tell and we will begin with repeat balloon dilation as well as removing the NSAIDs from her medications.  It is not clear to me that this pancreatic cyst is not more than just a pseudocyst but in the setting of its quick recurrence we do need to monitor it.  My plan will be for an MRI/MRCP in 6 months and consider the potential of repeat fluid aspiration if there are any changes or if symptomatically she feels that makes an improvement.  Time will tell.  We need to be mindful due to her family history of pancreas cancer in her mother even though it was at a later age.  The risks and benefits of  endoscopic evaluation were discussed with the patient; these include but are not limited to the risk of perforation, infection, bleeding, missed lesions, lack of diagnosis, severe illness requiring hospitalization, as well as anesthesia and sedation related illnesses.  The patient  and/or family is agreeable to proceed.  All patient questions were answered to the best of my ability, and the patient agrees to the aforementioned plan of action with follow-up as indicated.     PLAN  Laboratories as outlined below Stool studies as outlined below CA 19-9 to be obtained due to pancreatic cyst for further evaluation in case repeat FNA/FNB may be required and earlier imaging MRI/MRCP to be scheduled for 6 months from now Repeat endoscopy for further duodenal stricture dilation and if this is not successful or persistent inflammation/stenosis is present after dilation will need to consider potential AXIOS stenting Colonoscopy for evaluation of chronic diarrhea with biopsies but also to for evaluation of her chronic iron deficiency (thankfully iron deficiency has been improving) She may continue to use Imodium for now but may consider Lomotil in future Patient will need formal EGD surveillance in 3 years for Barrett's esophagus nondysplastic   At this point the biggest issue is that she has sufficient protein stores for healing and she is yet to be cleared by cardiology.  Albumin levels are normal we will check a prealbumin level at this visit.  The patient still has occasional diarrhea stool studies were normal.  Patient still is having the palpitations that come and go will need further cardiology clearance.  From a gastrointestinal perspective I think she is stable enough to undergo knee replacement. Elevated blood pressure is another issue here today blood pressure remains in the 165/90 range  01/22/22  Essential hypertension    Blood pressure not yet controlled Plan to add carvedilol 12.5 mg twice  daily continue amlodipine as prescribed  Cardiology referral for preop clearance will be obtained urgently for Dr. Percell Miller with regards to the total right knee replacement      Relevant Medications   amLODipine (NORVASC) 10 MG tablet   carvedilol (COREG) 12.5 MG tablet   Other Relevant Orders   Ambulatory referral to Cardiology     Digestive   Cholelithiasis    Cholelithiasis without common bile duct disease we will monitor      Chronic diarrhea    Stool studies negative I wonder if she simply just suffering from malabsorption      Relevant Orders   Prealbumin   Duodenal stricture    Esophageal stricture is to be addressed again in the morning with upper endoscopy per Dr. Rush Landmark      Relevant Orders   Prealbumin   Pancreatic cyst    Chronic pancreatic cyst with duodenal stricture this likely may complicate any postoperative course with her knee replacement also need to reassess nutrition status with a prealbumin      Peptic ulcer disease    Ongoing peptic ulcer disease recent upper endoscopy showed gastritis and esophagitis patient is on appropriate therapy with double dose proton pump inhibitor and Carafate  Patient will need additional duodenal stricture assessment this is going to occur tomorrow 10/30/2021 also needs colonoscopy note stool studies were negative with chronic diarrhea      Barrett's esophagus without dysplasia    Esophagitis shows Barrett's esophagus being monitored by gastroenterology      Nausea and vomiting    Still has episodic nausea and vomiting this may complicate any planned knee replacement        Nervous and Auditory   Peripheral polyneuropathy    Significant polyneuropathy involving both feet      Relevant Medications   citalopram (CELEXA) 40 MG tablet   donepezil (ARICEPT) 10 MG tablet  DULoxetine HCl 40 MG CPEP   gabapentin (NEURONTIN) 300 MG capsule   QUEtiapine (SEROQUEL) 50 MG tablet   tiZANidine (ZANAFLEX) 4 MG tablet    traZODone (DESYREL) 100 MG tablet     Musculoskeletal and Integument   Primary osteoarthritis of both knees    Status post left knee replacement in the past needs a right knee replacement  At this point I cannot clear this patient from a cardiac standpoint she is not optimized she is optimized medically only at this time she is still a prohibitive risk I would like cardiology's input on her I am obtaining a stat consult on this and will be back in touch with Dr. Percell Miller with regards to these results in the interim I will send an interim report over to his office gastroenterology also is to be given follow-up as well      Relevant Medications   tiZANidine (ZANAFLEX) 4 MG tablet   traMADol (ULTRAM) 50 MG tablet     Other   Memory loss    This has improved with Aricept      Chronic abdominal pain   Relevant Medications   citalopram (CELEXA) 40 MG tablet   DULoxetine HCl 40 MG CPEP   gabapentin (NEURONTIN) 300 MG capsule   tiZANidine (ZANAFLEX) 4 MG tablet   traMADol (ULTRAM) 50 MG tablet   traZODone (DESYREL) 100 MG tablet   Severe episode of recurrent major depressive disorder, without psychotic features ([place])    Patient with history of significant depression she is on 2 different antidepressants she does not have a specific plan for suicide were watching      Relevant Medications   citalopram (CELEXA) 40 MG tablet   DULoxetine HCl 40 MG CPEP   QUEtiapine (SEROQUEL) 50 MG tablet   traZODone (DESYREL) 100 MG tablet   Palpitations - Primary    Plan referral to cardiology      Relevant Orders   Ambulatory referral to Cardiology   Other Visit Diagnoses     Duodenal ulcer       Relevant Medications   lansoprazole (PREVACID SOLUTAB) 30 MG disintegrating tablet   PUD (peptic ulcer disease)       Relevant Medications   sucralfate (CARAFATE) 1 GM/10ML suspension   Past Medical History:  Diagnosis Date   Acute saddle pulmonary embolism without acute cor pulmonale ([place])     Basal cell carcinoma 01/16/2021   L nose, MOHs 04/14/21   Collagen vascular disease ([place])    Duodenal ulcer    Essential hypertension, benign 01/24/2015   Family history of ovarian cancer 01/06/2021   Family history of pancreatic cancer 01/06/2021   Family history of uterine cancer 01/06/2021   Foot ulcer ([place])    GERD (gastroesophageal reflux disease)    Hypertension    Memory loss    Peptic ulcer with perforation ([place])    Perforated abdominal viscus 04/22/2020   PUD (peptic ulcer disease)    with perforation s/p exploratory laparotomy   Pulmonary embolus ([place]) 04/2020   Skin cancer    Suicidal ideations 08/07/2020   Vision abnormalities     Past Surgical History:  Procedure Laterality Date   BILIARY DILATION  08/21/2021   Procedure: DUODENAL DILATION;  Surgeon: Irving Copas., MD;  Location: [place];  Service: Gastroenterology;;   BIOPSY  11/20/2020   Procedure: BIOPSY;  Surgeon: Irving Copas., MD;  Location: [facility];  Service: Gastroenterology;;   BIOPSY  08/21/2021   Procedure: BIOPSY;  Surgeon: Rush Landmark,  Telford Nab., MD;  Location: [facility];  Service: Gastroenterology;;   CENTRAL LINE INSERTION Right 04/22/2020   Procedure: CENTRAL LINE INSERTION;  Surgeon: Virl Cagey, MD;  Location: [facility];  Service: General;  Laterality: Right;   DIAGNOSTIC LAPAROSCOPY     ESOPHAGOGASTRODUODENOSCOPY N/A 11/20/2020   Procedure: ESOPHAGOGASTRODUODENOSCOPY (EGD);  Surgeon: Irving Copas., MD;  Location: [facility];  Service: Gastroenterology;  Laterality: N/A;   ESOPHAGOGASTRODUODENOSCOPY (EGD) WITH PROPOFOL N/A 07/10/2020   Procedure: ESOPHAGOGASTRODUODENOSCOPY (EGD) WITH PROPOFOL;  Surgeon: Rogene Houston, MD;  [facility];  Service: Endoscopy;  Laterality: N/A;   ESOPHAGOGASTRODUODENOSCOPY (EGD) WITH PROPOFOL N/A 10/18/2020   Procedure: ESOPHAGOGASTRODUODENOSCOPY (EGD) WITH PROPOFOL;  Surgeon: Harvel Quale, MD;   [facility];  Service: Gastroenterology;  Laterality: N/A;   ESOPHAGOGASTRODUODENOSCOPY (EGD) WITH PROPOFOL N/A 08/21/2021   Procedure: ESOPHAGOGASTRODUODENOSCOPY (EGD) WITH PROPOFOL;  Surgeon: Rush Landmark Telford Nab., MD;  Location: [place];  Service: Gastroenterology;  Laterality: N/A;   EUS N/A 11/20/2020   Procedure: UPPER ENDOSCOPIC ULTRASOUND (EUS) LINEAR;  Surgeon: Irving Copas., MD;  Location: [facility];  Service: Gastroenterology;  Laterality: N/A;   EUS  08/21/2021   Procedure: UPPER ENDOSCOPIC ULTRASOUND (EUS) LINEAR;  Surgeon: Rush Landmark Telford Nab., MD;  Location: [place];  Service: Gastroenterology;;   FINE NEEDLE ASPIRATION  08/21/2021   Procedure: FINE NEEDLE ASPIRATION (FNA) LINEAR;  Surgeon: Irving Copas., MD;  Location: [place];  Service: Gastroenterology;;   Toniann Ket  04/22/2020   Procedure: Toniann Ket;  Surgeon: Virl Cagey, MD;  Location: [facility];  Service: General;;   IR CATHETER TUBE CHANGE  05/09/2020   LAPAROTOMY N/A 04/22/2020   Procedure: EXPLORATORY LAPAROTOMY;  Surgeon: Virl Cagey, MD;  Location: [facility];  Service: General;  Laterality: N/A;   TONSILLECTOMY      Family History  Problem Relation Age of Onset   High Cholesterol Mother    Hypertension Mother    Arthritis/Rheumatoid Mother    Pancreatic cancer Mother 31   Alcohol abuse Father    Hypertension Maternal Grandmother    Uterine cancer Maternal Grandmother 38   Ovarian cancer Cousin        before age 55   Colon cancer Neg Hx    Colon polyps Neg Hx    Inflammatory bowel disease Neg Hx    Esophageal cancer Neg Hx    Liver disease Neg Hx    Rectal cancer Neg Hx    Stomach cancer Neg Hx     Social History   Socioeconomic History   Marital status: Widowed    Spouse name: Not on file   Number of children: 2   Years of education: Not on file   Highest education level: Not on file  Occupational History   Not on file  Tobacco  Use   Smoking status: Never   Smokeless tobacco: Never  Vaping Use   Vaping Use: Never used  Substance and Sexual Activity   Alcohol use: Yes    Alcohol/week: 2.0 - 3.0 standard drinks    Types: 2 - 3 Glasses of wine per week   Drug use: No   Sexual activity: Not Currently    Birth control/protection: Post-menopausal  Other Topics Concern   Not on file  Social History Narrative   Not on file   Social Determinants of Health   Financial Resource Strain: Low Risk    Difficulty of Paying Living Expenses: Not hard at all  Food Insecurity: No Food Insecurity  Worried About Charity fundraiser in the Last Year: Never true   [place] in the Last Year: Never true  Transportation Needs: Unmet Transportation Needs   Lack of Transportation (Medical): Yes   Lack of Transportation (Non-Medical): No  Physical Activity: Insufficiently Active   Days of Exercise per Week: 7 days   Minutes of Exercise per Session: 10 min  Stress: Stress Concern Present   Feeling of Stress : Very much  Social Connections: Moderately Integrated   Frequency of Communication with Friends and Family: More than three times a week   Frequency of Social Gatherings with Friends and Family: More than three times a week   Attends Religious Services: More than 4 times per year   Active Member of [employer] or Organizations: Yes   Attends Archivist Meetings: More than 4 times per year   Marital Status: Widowed  Human resources officer Violence: Not At Risk   Fear of Current or Ex-Partner: No   Emotionally Abused: No   Physically Abused: No   Sexually Abused: No    Outpatient Medications Prior to Visit  Medication Sig Dispense Refill   acetaminophen (TYLENOL) 500 MG tablet Take 1 tablet (500 mg total) by mouth every 6 (six) hours as needed for mild pain or fever.     amLODipine (NORVASC) 10 MG tablet Take 1 tablet (10 mg total) by mouth daily. For BP 90 tablet 3   Ascorbic Acid (VITAMIN C PO) Take 1 tablet by  mouth daily.     carvedilol (COREG) 12.5 MG tablet Take 1 tablet (12.5 mg total) by mouth 2 (two) times daily with a meal. 60 tablet 3   citalopram (CELEXA) 40 MG tablet Take 1 tablet (40 mg total) by mouth at bedtime. 90 tablet 2   donepezil (ARICEPT) 10 MG tablet Take 1 tablet (10 mg total) by mouth in the morning and at bedtime. 180 tablet 0   DULoxetine HCl 40 MG CPEP Take 1 capsule by mouth daily. 60 capsule 4   ferrous sulfate 325 (65 FE) MG tablet Take 325 mg by mouth in the morning.     gabapentin (NEURONTIN) 300 MG capsule Take 1 capsule (300 mg total) by mouth 2 (two) times daily. 60 capsule 6   lactose free nutrition (BOOST) LIQD Take 237 mLs by mouth 2 (two) times daily. Boost Breeze Clear     lansoprazole (PREVACID SOLUTAB) 30 MG disintegrating tablet Place 1 tablet on tongue twice daily and allow to dissolve until particles can be swallowed. Do not swallow whole, break, cut, or chew tablet. 60 tablet 3   lidocaine (LIDODERM) 5 % Place 1 patch onto the skin daily. Remove & Discard patch within 12 hours or as directed by MD 30 patch 4   lisinopril-hydrochlorothiazide (ZESTORETIC) 10-12.5 MG tablet Take 1 tablet by mouth daily. 90 tablet 1   loperamide (IMODIUM) 2 MG capsule Take 1 capsule (2 mg total) by mouth 2 (two) times daily as needed for diarrhea or loose stools. 14 capsule 0   metoprolol tartrate (LOPRESSOR) 100 MG tablet TAKE 1 TABLET BY MOUTH 2 HOURS PRIOR TO THE CARDIAC CT 1 tablet 0   promethazine (PHENERGAN) 12.5 MG tablet Take 1 tablet (12.5 mg total) by mouth every 8 (eight) hours as needed for nausea or vomiting. 20 tablet 0   QUEtiapine (SEROQUEL) 50 MG tablet Take 1 tablet (50 mg total) by mouth at bedtime. 90 tablet 1   RABEprazole (ACIPHEX) 20 MG tablet Take 1 tablet (  20 mg total) by mouth 2 (two) times daily. 60 tablet 6   sucralfate (CARAFATE) 1 GM/10ML suspension Take 10 mLs (1 g total) by mouth 4 (four) times daily -  with meals and at bedtime. 420 mL 2    tiZANidine (ZANAFLEX) 4 MG tablet Take 1 tablet (4 mg total) by mouth every 8 (eight) hours as needed for muscle spasms. 30 tablet 0   traMADol (ULTRAM) 50 MG tablet Take 1 tablet (50 mg total) by mouth every 8 (eight) hours as needed. 60 tablet 0   traZODone (DESYREL) 100 MG tablet Take 1 tablet (100 mg total) by mouth at bedtime. 30 tablet 1   tretinoin (RETIN-A) 0.025 % cream Apply a pea sized amount to the entire face QHS. 45 g 3   No facility-administered medications prior to visit.    Allergies  Allergen Reactions   Wheat Extract Swelling    ROS Review of Systems  Constitutional:  Negative for chills, diaphoresis and fever.  HENT:  Negative for congestion, hearing loss, nosebleeds, sore throat and tinnitus.   Eyes:  Negative for photophobia and redness.  Respiratory:  Negative for cough, shortness of breath, wheezing and stridor.   Cardiovascular:  Negative for chest pain, palpitations and leg swelling.  Gastrointestinal:  Negative for abdominal pain, blood in stool, constipation, diarrhea, nausea and vomiting.  Endocrine: Negative for polydipsia.  Genitourinary:  Negative for dysuria, flank pain, frequency, hematuria and urgency.  Musculoskeletal:  Negative for back pain, myalgias and neck pain.  Skin:  Negative for rash.  Allergic/Immunologic: Negative for environmental allergies.  Neurological:  Negative for dizziness, tremors, seizures, weakness and headaches.  Hematological:  Does not bruise/bleed easily.  Psychiatric/Behavioral:  Positive for dysphoric mood and suicidal ideas. Negative for self-injury and sleep disturbance. The patient is not nervous/anxious.      Objective:    Physical Exam Vitals reviewed.  Constitutional:      Appearance: Normal appearance. She is well-developed. She is not diaphoretic.  HENT:     Head: Normocephalic and atraumatic.     Nose: No nasal deformity, septal deviation, mucosal edema or rhinorrhea.     Right Sinus: No maxillary sinus  tenderness or frontal sinus tenderness.     Left Sinus: No maxillary sinus tenderness or frontal sinus tenderness.     Mouth/Throat:     Pharynx: No oropharyngeal exudate.  Eyes:     General: No scleral icterus.    Conjunctiva/sclera: Conjunctivae normal.     Pupils: Pupils are equal, round, and reactive to light.  Neck:     Thyroid: No thyromegaly.     Vascular: No carotid bruit or JVD.     Trachea: Trachea normal. No tracheal tenderness or tracheal deviation.  Cardiovascular:     Rate and Rhythm: Normal rate and regular rhythm.     Chest Wall: PMI is not displaced.     Pulses: Normal pulses. No decreased pulses.     Heart sounds: Normal heart sounds, S1 normal and S2 normal. Heart sounds not distant. No murmur heard. No systolic murmur is present.  No diastolic murmur is present.    No friction rub. No gallop. No S3 or S4 sounds.  Pulmonary:     Effort: No tachypnea, accessory muscle usage or respiratory distress.     Breath sounds: No stridor. No decreased breath sounds, wheezing, rhonchi or rales.  Chest:     Chest wall: No tenderness.  Abdominal:     General: Bowel sounds are normal. There is no  distension.     Palpations: Abdomen is soft. Abdomen is not rigid.     Tenderness: There is abdominal tenderness. There is no guarding or rebound.     Comments: Tender epigastric area  Musculoskeletal:        General: Tenderness present.     Cervical back: Normal range of motion and neck supple. No edema, erythema or rigidity. No muscular tenderness. Normal range of motion.     Comments: Decreased range of motion right knee  Lymphadenopathy:     Head:     Right side of head: No submental or submandibular adenopathy.     Left side of head: No submental or submandibular adenopathy.     Cervical: No cervical adenopathy.  Skin:    General: Skin is warm and dry.     Coloration: Skin is not pale.     Findings: No rash.     Nails: There is no clubbing.  Neurological:     General: No  focal deficit present.     Mental Status: She is alert and oriented to person, place, and time. Mental status is at baseline.     Sensory: No sensory deficit.  Psychiatric:        Attention and Perception: Attention and perception normal.        Mood and Affect: Mood is depressed. Affect is flat.        Speech: Speech normal.        Behavior: Behavior normal. Behavior is cooperative.        Thought Content: Thought content is not paranoid or delusional. Thought content includes suicidal ideation. Thought content does not include homicidal ideation. Thought content does not include homicidal or suicidal plan.        Cognition and Memory: Memory is impaired. She exhibits impaired remote memory.        Judgment: Judgment normal.    There were no vitals taken for this visit. Wt Readings from Last 3 Encounters:  12/15/21 190 lb 6.4 oz (86.4 kg)  10/30/21 174 lb (78.9 kg)  10/29/21 176 lb 9.6 oz (80.1 kg)     Health Maintenance Due  Topic Date Due   Zoster Vaccines- Shingrix (1 of 2) Never done   COVID-19 Vaccine (3 - Moderna risk series) 01/10/2020    There are no preventive care reminders to display for this patient.  Lab Results  Component Value Date   TSH 1.31 10/08/2021   Lab Results  Component Value Date   WBC 6.8 10/08/2021   HGB 12.3 10/08/2021   HCT 36.3 10/08/2021   MCV 90.2 10/08/2021   PLT 187.0 10/08/2021   Lab Results  Component Value Date   NA 143 12/15/2021   K 4.3 12/15/2021   CO2 24 12/15/2021   GLUCOSE 105 (H) 12/15/2021   BUN 16 12/15/2021   CREATININE 0.74 12/15/2021   BILITOT 0.5 09/24/2021   ALKPHOS 44 09/24/2021   AST 16 09/24/2021   ALT 8 09/24/2021   PROT 6.7 09/24/2021   ALBUMIN 3.9 09/24/2021   CALCIUM 9.3 12/15/2021   ANIONGAP 4 (L) 09/24/2021   EGFR 90 12/15/2021   No results found for: CHOL No results found for: HDL No results found for: LDLCALC No results found for: TRIG No results found for: CHOLHDL Lab Results  Component  Value Date   HGBA1C 5.5 10/08/2021      Assessment & Plan:   Problem List Items Addressed This Visit   None  No orders of the defined  types were placed in this encounter. 65 minutes spent going over the patient's old records performing history and physical complex assessment high risk of hospital readmission high degree of complexity and all medication prescriptions collaboration with multiple providers  Follow-up: No follow-ups on file.    Asencion Noble, MD

## 2022-01-22 ENCOUNTER — Ambulatory Visit: Payer: Medicare Other | Admitting: Critical Care Medicine

## 2022-01-26 ENCOUNTER — Inpatient Hospital Stay (HOSPITAL_COMMUNITY): Payer: Medicare Other | Attending: Hematology

## 2022-01-26 DIAGNOSIS — Z86711 Personal history of pulmonary embolism: Secondary | ICD-10-CM | POA: Diagnosis present

## 2022-01-26 DIAGNOSIS — D509 Iron deficiency anemia, unspecified: Secondary | ICD-10-CM | POA: Insufficient documentation

## 2022-01-26 DIAGNOSIS — D500 Iron deficiency anemia secondary to blood loss (chronic): Secondary | ICD-10-CM

## 2022-01-26 LAB — FERRITIN: Ferritin: 167 ng/mL (ref 11–307)

## 2022-01-26 LAB — CBC WITH DIFFERENTIAL/PLATELET
Abs Immature Granulocytes: 0.02 10*3/uL (ref 0.00–0.07)
Basophils Absolute: 0 10*3/uL (ref 0.0–0.1)
Basophils Relative: 1 %
Eosinophils Absolute: 0.1 10*3/uL (ref 0.0–0.5)
Eosinophils Relative: 2 %
HCT: 39.5 % (ref 36.0–46.0)
Hemoglobin: 13.3 g/dL (ref 12.0–15.0)
Immature Granulocytes: 0 %
Lymphocytes Relative: 20 %
Lymphs Abs: 1.1 10*3/uL (ref 0.7–4.0)
MCH: 31.6 pg (ref 26.0–34.0)
MCHC: 33.7 g/dL (ref 30.0–36.0)
MCV: 93.8 fL (ref 80.0–100.0)
Monocytes Absolute: 0.4 10*3/uL (ref 0.1–1.0)
Monocytes Relative: 8 %
Neutro Abs: 3.8 10*3/uL (ref 1.7–7.7)
Neutrophils Relative %: 69 %
Platelets: 150 10*3/uL (ref 150–400)
RBC: 4.21 MIL/uL (ref 3.87–5.11)
RDW: 12.8 % (ref 11.5–15.5)
WBC: 5.6 10*3/uL (ref 4.0–10.5)
nRBC: 0 % (ref 0.0–0.2)

## 2022-01-26 LAB — IRON AND TIBC
Iron: 108 ug/dL (ref 28–170)
Saturation Ratios: 31 % (ref 10.4–31.8)
TIBC: 344 ug/dL (ref 250–450)
UIBC: 236 ug/dL

## 2022-01-29 ENCOUNTER — Encounter (HOSPITAL_COMMUNITY): Payer: Self-pay

## 2022-01-31 NOTE — Progress Notes (Deleted)
NO SHOW

## 2022-02-02 ENCOUNTER — Encounter (HOSPITAL_COMMUNITY): Payer: Self-pay | Admitting: Hematology

## 2022-02-02 ENCOUNTER — Inpatient Hospital Stay (HOSPITAL_COMMUNITY): Payer: Medicare Other | Admitting: Physician Assistant

## 2022-02-17 ENCOUNTER — Other Ambulatory Visit: Payer: Self-pay | Admitting: Critical Care Medicine

## 2022-03-04 ENCOUNTER — Telehealth (HOSPITAL_COMMUNITY): Payer: Self-pay | Admitting: Emergency Medicine

## 2022-03-04 NOTE — Telephone Encounter (Signed)
Reaching out to patient to offer assistance regarding upcoming cardiac imaging study; pt verbalizes understanding of appt date/time, parking situation and where to check in, pre-test NPO status and medications ordered, and verified current allergies; name and call back number provided for further questions should they arise Marchia Bond RN Navigator Cardiac Imaging [facility] [phone] office [phone] cell  Arrival 930 '100mg'$  metoprolol tartrate Aware of iv  Aware of nitro

## 2022-03-05 ENCOUNTER — Ambulatory Visit (HOSPITAL_COMMUNITY)
Admission: RE | Admit: 2022-03-05 | Discharge: 2022-03-05 | Disposition: A | Discharge status: Home / Self Care | Payer: Medicare Other | Source: Ambulatory Visit | Attending: Nurse Practitioner | Admitting: Nurse Practitioner

## 2022-03-05 DIAGNOSIS — I200 Unstable angina: Secondary | ICD-10-CM | POA: Insufficient documentation

## 2022-03-05 MED ORDER — NITROGLYCERIN 0.4 MG SL SUBL
SUBLINGUAL_TABLET | SUBLINGUAL | Status: AC
  Filled 2022-03-05: qty 2

## 2022-03-05 MED ORDER — NITROGLYCERIN 0.4 MG SL SUBL
0.8000 mg | SUBLINGUAL_TABLET | Freq: Once | SUBLINGUAL | Status: AC
  Administered 2022-03-05: 0.8 mg via SUBLINGUAL

## 2022-03-05 MED ORDER — IOHEXOL 350 MG/ML SOLN
100.0000 mL | Freq: Once | INTRAVENOUS | Status: AC | PRN
  Administered 2022-03-05: 100 mL via INTRAVENOUS

## 2022-03-13 ENCOUNTER — Encounter (HOSPITAL_COMMUNITY): Payer: Self-pay | Admitting: Hematology

## 2022-03-16 ENCOUNTER — Telehealth: Payer: Self-pay | Admitting: Nurse Practitioner

## 2022-03-16 ENCOUNTER — Encounter: Payer: Self-pay | Admitting: *Deleted

## 2022-03-16 ENCOUNTER — Ambulatory Visit: Payer: Medicare Other | Admitting: Dermatology

## 2022-03-16 NOTE — Telephone Encounter (Signed)
  Pt is returning calling to get CT result

## 2022-03-16 NOTE — Telephone Encounter (Signed)
Pt verbalized understanding fo her cardiac CT results and copy sent to her PCP... at [facility]... Dr Dorian Pod.

## 2022-03-23 ENCOUNTER — Ambulatory Visit (INDEPENDENT_AMBULATORY_CARE_PROVIDER_SITE_OTHER): Payer: Medicare Other | Admitting: Podiatry

## 2022-03-23 ENCOUNTER — Ambulatory Visit (INDEPENDENT_AMBULATORY_CARE_PROVIDER_SITE_OTHER): Payer: Medicare Other

## 2022-03-23 ENCOUNTER — Encounter: Payer: Self-pay | Admitting: Podiatry

## 2022-03-23 DIAGNOSIS — M21619 Bunion of unspecified foot: Secondary | ICD-10-CM

## 2022-03-23 DIAGNOSIS — B351 Tinea unguium: Secondary | ICD-10-CM | POA: Diagnosis not present

## 2022-03-23 DIAGNOSIS — M79671 Pain in right foot: Secondary | ICD-10-CM

## 2022-03-23 DIAGNOSIS — M79672 Pain in left foot: Secondary | ICD-10-CM

## 2022-03-23 DIAGNOSIS — M778 Other enthesopathies, not elsewhere classified: Secondary | ICD-10-CM

## 2022-03-23 MED ORDER — TRIAMCINOLONE ACETONIDE 10 MG/ML IJ SUSP
20.0000 mg | Freq: Once | INTRAMUSCULAR | Status: AC
  Administered 2022-03-23: 20 mg

## 2022-03-23 NOTE — Progress Notes (Signed)
Subjective:   Patient ID: Maria Murphy, female   DOB: 66 y.o.   MRN: [id number]   HPI Patient presents concerns about bunion deformity and pain on top of both feet and nail disease stating the pain on top of the feet are worse and her nails she is concerned about growth patterns.  Patient does not smoke likes to be active   Review of Systems  All other systems reviewed and are negative.       Objective:  Physical Exam Vitals and nursing note reviewed.  Constitutional:      Appearance: She is well-developed.  Pulmonary:     Effort: Pulmonary effort is normal.  Musculoskeletal:        General: Normal range of motion.  Skin:    General: Skin is warm.  Neurological:     Mental Status: She is alert.     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  She wants this procedure I did sterile prep I injected each extensor complex midtarsal joint bilateral 3 mg dexamethasone Kenalog 5 mg Xylocaine discussed her bunions we could consider it but I want to hold off and see how she responds first and do not recommend treatment for nails  X-rays indicate significant elevation of the intermetatarsal angle bilateral with osteoporotic type heads noted

## 2022-03-25 ENCOUNTER — Ambulatory Visit: Payer: Medicare Other | Admitting: Critical Care Medicine

## 2022-03-30 ENCOUNTER — Telehealth: Payer: Self-pay

## 2022-03-30 DIAGNOSIS — K862 Cyst of pancreas: Secondary | ICD-10-CM

## 2022-03-30 NOTE — Telephone Encounter (Signed)
-----   Message from Timothy Lasso, RN sent at 09/29/2021 10:16 AM EST ----- repeat the imaging with an MRI/MRCP in 6 months

## 2022-03-30 NOTE — Telephone Encounter (Signed)
The pt has been advised she is due for a MRI MRCP.  She has agreed to the follow up. The order has been entered and sent to the schedulers.

## 2022-04-14 ENCOUNTER — Ambulatory Visit (HOSPITAL_COMMUNITY)
Admission: RE | Admit: 2022-04-14 | Discharge: 2022-04-14 | Disposition: A | Discharge status: Home / Self Care | Payer: Medicare Other | Source: Ambulatory Visit | Attending: Gastroenterology | Admitting: Gastroenterology

## 2022-04-14 ENCOUNTER — Other Ambulatory Visit: Payer: Self-pay | Admitting: Gastroenterology

## 2022-04-14 DIAGNOSIS — K862 Cyst of pancreas: Secondary | ICD-10-CM

## 2022-04-14 MED ORDER — GADOBUTROL 1 MMOL/ML IV SOLN
8.5000 mL | Freq: Once | INTRAVENOUS | Status: AC | PRN
Start: 2022-04-14 — End: 2022-04-14
  Administered 2022-04-14: 8.5 mL via INTRAVENOUS

## 2022-04-23 ENCOUNTER — Ambulatory Visit: Payer: Medicare Other | Admitting: Dermatology

## 2022-05-04 ENCOUNTER — Ambulatory Visit (INDEPENDENT_AMBULATORY_CARE_PROVIDER_SITE_OTHER): Payer: Medicare Other | Admitting: Podiatry

## 2022-05-04 ENCOUNTER — Encounter: Payer: Self-pay | Admitting: Podiatry

## 2022-05-04 DIAGNOSIS — B351 Tinea unguium: Secondary | ICD-10-CM

## 2022-05-04 DIAGNOSIS — M21619 Bunion of unspecified foot: Secondary | ICD-10-CM | POA: Diagnosis not present

## 2022-05-04 DIAGNOSIS — M778 Other enthesopathies, not elsewhere classified: Secondary | ICD-10-CM

## 2022-05-04 NOTE — Progress Notes (Signed)
Subjective:   Patient ID: Maria Murphy, female   DOB: 66 y.o.   MRN: [id number]   HPI Patient presents with 3 separate problems with 1 being pain in the top of the right foot that has improved secondarily bunion deformity right over left with numbness of her right foot that she states causes falls and a thickened hallux nail left   ROS      Objective:  Physical Exam  Neurovascular status intact with extensor tendinitis bilateral that has improved quite nicely with diminishment of pain and swelling dorsally with bunion deformity right moderately red but not enough to cause numbness of her entire foot and balance and fall issues with a thickened hallux nail left nonpainful     Assessment:  Improved extensor tendinitis bilateral with bunion deformity that I do not believe is causing the numbness she is experiencing that may be more systemic and damage left hallux nailbed     Plan:  H&P reviewed all conditions explained that the extensor tendinitis will probably have to treat again in future but not now and the structural bunion I do not recommend correction due to osteoporosis and health and I do not think it will improve the numbness experience that she has and discussed nail removal left which is the only way to treat this but she wants to hold off and I did educate her on the procedure

## 2022-06-19 ENCOUNTER — Ambulatory Visit: Payer: Medicare Other | Admitting: Gastroenterology

## 2022-06-23 ENCOUNTER — Encounter: Payer: Self-pay | Admitting: Gastroenterology

## 2022-06-23 ENCOUNTER — Ambulatory Visit (INDEPENDENT_AMBULATORY_CARE_PROVIDER_SITE_OTHER): Payer: Medicare Other | Admitting: Gastroenterology

## 2022-06-23 VITALS — BP 118/72 | HR 54 | Ht 64.0 in | Wt 183.0 lb

## 2022-06-23 DIAGNOSIS — R152 Fecal urgency: Secondary | ICD-10-CM

## 2022-06-23 DIAGNOSIS — K529 Noninfective gastroenteritis and colitis, unspecified: Secondary | ICD-10-CM

## 2022-06-23 DIAGNOSIS — K269 Duodenal ulcer, unspecified as acute or chronic, without hemorrhage or perforation: Secondary | ICD-10-CM

## 2022-06-23 DIAGNOSIS — R1011 Right upper quadrant pain: Secondary | ICD-10-CM

## 2022-06-23 DIAGNOSIS — K862 Cyst of pancreas: Secondary | ICD-10-CM

## 2022-06-23 DIAGNOSIS — K580 Irritable bowel syndrome with diarrhea: Secondary | ICD-10-CM

## 2022-06-23 NOTE — Progress Notes (Signed)
[facility] VISIT   Primary Care Provider Loura Pardon, MD [address] [place] [address] [phone]   Patient Profile: Maria Murphy is a 66 y.o. 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  The patient presents to the [facility] for an evaluation and management of problem(s) noted below:  Problem List 1. Chronic diarrhea   2. Irritable bowel syndrome with diarrhea   3. Fecal urgency   4. RUQ pain   5. Pancreatic cyst   6. Duodenal ulcer     History of Present Illness Please see prior notes for full details of HPI.  Interval History The patient returns for follow-up.  She was scheduled for clinic last week but unfortunately due to transportation issues was not able to make it but we had an opening so she came today.  Patient states she continues to deal with ongoing diarrheal symptoms.  She uses the restroom between 2 and 4 times per day.  She takes Imodium (but is only taking 4 to 6 mg total in a day) and still having these bowel movements.  She has urgency.  She has not noted any blood.  She has not noted any mucus.  Her last colonoscopy as outlined below showed no evidence of IBD or microscopic colitis.  The patient hopes that she can do better.  Patient still having episodes of abdominal pain as well as nausea.  She only takes her PPI and Carafate if she has symptoms but otherwise if she has a day where she has no symptoms she does not take her medications.  Her last endoscopy as outlined below showed evidence of a persisting duodenal ulcer.  Patient is hopeful that we can  come up with answers and help her continue to feel better.    GI Review of Systems Positive as above  Negative for pyrosis (for the most part she is not experiencing this), odynophagia, dysphagia, melena, hematochezia  Review of Systems General: Denies fevers/chills/weight loss unintentionally Cardiovascular: Denies chest pain Pulmonary: Denies shortness of breath Gastroenterological: See HPI Genitourinary: Denies darkened urine Hematological: Denies easy bruising/bleeding Dermatological: Denies jaundice Psychological: Mood is stable   Medications Current Outpatient Medications  Medication Sig Dispense Refill   acetaminophen (TYLENOL) 500 MG tablet Take 1 tablet (500 mg total) by mouth every 6 (six) hours as needed for mild pain or fever.     amLODipine (NORVASC) 10 MG tablet Take 1 tablet (10 mg total) by mouth daily. For BP 90 tablet 3   Ascorbic Acid (VITAMIN C PO) Take 1 tablet by mouth daily.     carvedilol (COREG) 12.5 MG tablet Take 1 tablet (12.5 mg total) by mouth 2 (two) times daily with a meal. 60 tablet 3   citalopram (CELEXA) 40 MG tablet Take 1 tablet (40 mg total) by mouth at bedtime. 90 tablet 2   donepezil (ARICEPT) 10 MG tablet TAKE 1 TABLET BY MOUTH IN THE MORNING AND AT BEDTIME 180 tablet 0   DULoxetine HCl 40 MG CPEP Take 1 capsule by mouth daily. 60 capsule 4   ferrous sulfate 325 (65 FE) MG tablet Take 325 mg by mouth in the morning.     gabapentin (NEURONTIN) 300 MG capsule Take 1 capsule (  300 mg total) by mouth 2 (two) times daily. 60 capsule 6   lactose free nutrition (BOOST) LIQD Take 237 mLs by mouth 2 (two) times daily. Boost Breeze Clear     lansoprazole (PREVACID SOLUTAB) 30 MG disintegrating tablet Place 1 tablet on tongue twice daily and allow to dissolve until particles can be swallowed. Do not swallow whole, break, cut, or chew tablet. 60 tablet 3   lisinopril-hydrochlorothiazide (ZESTORETIC) 10-12.5 MG tablet Take 1 tablet by mouth daily. 90 tablet 1    loperamide (IMODIUM) 2 MG capsule Take 1 capsule (2 mg total) by mouth 2 (two) times daily as needed for diarrhea or loose stools. 14 capsule 0   promethazine (PHENERGAN) 12.5 MG tablet Take 1 tablet (12.5 mg total) by mouth every 8 (eight) hours as needed for nausea or vomiting. 20 tablet 0   QUEtiapine (SEROQUEL) 50 MG tablet Take 1 tablet (50 mg total) by mouth at bedtime. 90 tablet 1   RABEprazole (ACIPHEX) 20 MG tablet Take 1 tablet (20 mg total) by mouth 2 (two) times daily. 60 tablet 6   sucralfate (CARAFATE) 1 GM/10ML suspension Take 10 mLs (1 g total) by mouth 4 (four) times daily -  with meals and at bedtime. 420 mL 2   tiZANidine (ZANAFLEX) 4 MG tablet Take 1 tablet (4 mg total) by mouth every 8 (eight) hours as needed for muscle spasms. 30 tablet 0   traMADol (ULTRAM) 50 MG tablet Take 1 tablet (50 mg total) by mouth every 8 (eight) hours as needed. 60 tablet 0   traZODone (DESYREL) 100 MG tablet Take 1 tablet (100 mg total) by mouth at bedtime. 30 tablet 1   tretinoin (RETIN-A) 0.025 % cream Apply a pea sized amount to the entire face QHS. 45 g 3   No current facility-administered medications for this visit.    Allergies Allergies  Allergen Reactions   Wheat Extract Swelling    Histories Past Medical History:  Diagnosis Date   Acute saddle pulmonary embolism without acute cor pulmonale (HCC)    Basal cell carcinoma 01/16/2021   L nose, MOHs 04/14/21   Collagen vascular disease (HCC)    Duodenal ulcer    Essential hypertension, benign 01/24/2015   Family history of ovarian cancer 01/06/2021   Family history of pancreatic cancer 01/06/2021   Family history of uterine cancer 01/06/2021   Foot ulcer (HCC)    GERD (gastroesophageal reflux disease)    Hypertension    Memory loss    Peptic ulcer with perforation (HCC)    Perforated abdominal viscus 04/22/2020   PUD (peptic ulcer disease)    with perforation s/p exploratory laparotomy   Pulmonary embolus ([place]) 04/2020    Skin cancer    Suicidal ideations 08/07/2020   Vision abnormalities    Past Surgical History:  Procedure Laterality Date   BILIARY DILATION  08/21/2021   Procedure: DUODENAL DILATION;  Surgeon: Irving Copas., MD;  Location: [place];  Service: Gastroenterology;;   BIOPSY  11/20/2020   Procedure: BIOPSY;  Surgeon: Irving Copas., MD;  Location: [facility];  Service: Gastroenterology;;   BIOPSY  08/21/2021   Procedure: BIOPSY;  Surgeon: Irving Copas., MD;  Location[facility];  Service: Gastroenterology;;   CENTRAL LINE INSERTION Right 04/22/2020   Procedure: CENTRAL LINE INSERTION;  Surgeon: Virl Cagey, MD;  Location: [facility];  Service: General;  Laterality: Right;   DIAGNOSTIC LAPAROSCOPY     ESOPHAGOGASTRODUODENOSCOPY N/A 11/20/2020   Procedure: ESOPHAGOGASTRODUODENOSCOPY (EGD);  Surgeon:  Mansouraty, Telford Nab., MD;  Location: [facility];  Service: Gastroenterology;  Laterality: N/A;   ESOPHAGOGASTRODUODENOSCOPY (EGD) WITH PROPOFOL N/A 07/10/2020   Procedure: ESOPHAGOGASTRODUODENOSCOPY (EGD) WITH PROPOFOL;  Surgeon: Rogene Houston, MD;  [facility];  Service: Endoscopy;  Laterality: N/A;   ESOPHAGOGASTRODUODENOSCOPY (EGD) WITH PROPOFOL N/A 10/18/2020   Procedure: ESOPHAGOGASTRODUODENOSCOPY (EGD) WITH PROPOFOL;  Surgeon: Harvel Quale, MD;  [facility];  Service: Gastroenterology;  Laterality: N/A;   ESOPHAGOGASTRODUODENOSCOPY (EGD) WITH PROPOFOL N/A 08/21/2021   Procedure: ESOPHAGOGASTRODUODENOSCOPY (EGD) WITH PROPOFOL;  Surgeon: Rush Landmark Telford Nab., MD;  Location: [place];  Service: Gastroenterology;  Laterality: N/A;   EUS N/A 11/20/2020   Procedure: UPPER ENDOSCOPIC ULTRASOUND (EUS) LINEAR;  Surgeon: Irving Copas., MD;  Location: [facility];  Service: Gastroenterology;  Laterality: N/A;   EUS  08/21/2021   Procedure: UPPER ENDOSCOPIC ULTRASOUND (EUS) LINEAR;  Surgeon: Irving Copas., MD;  Location: [place];  Service: Gastroenterology;;   FINE NEEDLE ASPIRATION  08/21/2021   Procedure: FINE NEEDLE ASPIRATION (FNA) LINEAR;  Surgeon: Irving Copas., MD;  Location: [place];  Service: Gastroenterology;;   Toniann Ket  04/22/2020   Procedure: Toniann Ket;  Surgeon: Virl Cagey, MD;  Location: [facility];  Service: General;;   IR CATHETER TUBE CHANGE  05/09/2020   LAPAROTOMY N/A 04/22/2020   Procedure: EXPLORATORY LAPAROTOMY;  Surgeon: Virl Cagey, MD;  Location: [facility];  Service: General;  Laterality: N/A;   TONSILLECTOMY     Social History   Socioeconomic History   Marital status: Widowed    Spouse name: Not on file   Number of children: 2   Years of education: Not on file   Highest education level: Not on file  Occupational History   Not on file  Tobacco Use   Smoking status: Never   Smokeless tobacco: Never  Vaping Use   Vaping Use: Never used  Substance and Sexual Activity   Alcohol use: Yes    Alcohol/week: 2.0 - 3.0 standard drinks of alcohol    Types: 2 - 3 Glasses of wine per week   Drug use: No   Sexual activity: Not Currently    Birth control/protection: Post-menopausal  Other Topics Concern   Not on file  Social History Narrative   Not on file   Social Determinants of Health   Financial Resource Strain: Low Risk  (09/17/2021)   Overall Financial Resource Strain (CARDIA)    Difficulty of Paying Living Expenses: Not hard at all  Food Insecurity: No Food Insecurity (09/17/2021)   Hunger Vital Sign    Worried About Running Out of Food in the Last Year: Never true    Ran Out of Food in the Last Year: Never true  Transportation Needs: Unmet Transportation Needs (09/17/2021)   PRAPARE - Hydrologist (Medical): Yes    Lack of Transportation (Non-Medical): No  Physical Activity: Insufficiently Active (09/17/2021)   Exercise Vital Sign    Days of Exercise per Week: 7 days    Minutes  of Exercise per Session: 10 min  Stress: Stress Concern Present (09/17/2021)   [place]    Feeling of Stress : Very much  Social Connections: Moderately Integrated (09/17/2021)   Social Connection and Isolation Panel [NHANES]    Frequency of Communication with Friends and Family: More than three times a week    Frequency of Social Gatherings with Friends and Family: More than three times  a week    Attends Religious Services: More than 4 times per year    Active Member of Clubs or Organizations: Yes    Attends Archivist Meetings: More than 4 times per year    Marital Status: Widowed  Intimate Partner Violence: Not At Risk (09/17/2021)   Humiliation, Afraid, Rape, and Kick questionnaire    Fear of Current or Ex-Partner: No    Emotionally Abused: No    Physically Abused: No    Sexually Abused: No   Family History  Problem Relation Age of Onset   High Cholesterol Mother    Hypertension Mother    Arthritis/Rheumatoid Mother    Pancreatic cancer Mother 97   Alcohol abuse Father    Hypertension Maternal Grandmother    Uterine cancer Maternal Grandmother 65   Ovarian cancer Cousin        before age 35   Colon cancer Neg Hx    Colon polyps Neg Hx    Inflammatory bowel disease Neg Hx    Esophageal cancer Neg Hx    Liver disease Neg Hx    Rectal cancer Neg Hx    Stomach cancer Neg Hx    I have reviewed her medical, social, and family history in detail and updated the electronic medical record as necessary.    PHYSICAL EXAMINATION  BP 118/72   Pulse (!) 54   Ht '5\' 4"'$  (1.626 m)   Wt 183 lb (83 kg)   SpO2 98%   BMI 31.41 kg/m  Wt Readings from Last 3 Encounters:  06/23/22 183 lb (83 kg)  12/15/21 190 lb 6.4 oz (86.4 kg)  10/30/21 174 lb (78.9 kg)  GEN: NAD, appears stated age, doesn't appear chronically ill PSYCH: Cooperative, without pressured speech EYE: Conjunctivae pink, sclerae anicteric ENT:  MMM CV: Nontachycardic RESP: No audible wheezing GI: NABS, soft, protuberant abdomen, rounded, mild TTP in MEG, no rebound  MSK/EXT: Trace bilateral lower extremity edema SKIN: No jaundice NEURO:  Alert & Oriented x 3, no focal deficits   REVIEW OF DATA  I reviewed the following data at the time of this encounter:  GI Procedures and Studies  March 2023 EGD - No gross lesions in esophagus proximally. LA Grade A esophagitis with no bleeding distally. - Z-line irregular, 33 cm from the incisors. - 5 cm hiatal hernia. - Gastritis with hemorrhage. Biopsied. - 2 non-bleeding gastric ulcers with a clean ulcer base (Forrest Class III). Leads to an acquired deformity in the gastric antrum as well.  - Non-bleeding duodenal ulcer with a clean ulcer base (Forrest Class III) - this is worse than prior since her prior EGD showed ulcer was healing. - Duodenal deformity distal to the ulcer in previous stenosis region. Easily traversed. Wire guided-CRE dilation can be considered again, long 1.5 cm AXIOS may be possible though not absolute and with active inflammation proximal to this may be higher risk. - No gross lesions in the second portion of the duodenum.  March 2023 colonoscopy - Hemorrhoids found on digital rectal exam. - Five 2 to 6 mm polyps in the sigmoid colon, at the hepatic flexure, in the ascending colon and in the cecum, removed with a cold snare. Resected and retrieved. - Diverticulosis in the recto-sigmoid colon and in the sigmoid colon. - Normal mucosa in the entire examined colon otherwise. Biopsied.  Pathology Diagnosis 1. Surgical [P], random gastric sites - GASTRIC ANTRAL MUCOSA WITH NONSPECIFIC REACTIVE GASTROPATHY - GASTRIC OXYNTIC MUCOSA WITH NO SPECIFIC HISTOPATHOLOGIC CHANGES -  HELICOBACTER PYLORI-LIKE ORGANISMS ARE NOT IDENTIFIED ON ROUTINE H&E STAIN 2. Surgical [P], colon, sigmoid, hepatic flexure, ascending, and cecum, polyp (5) - TUBULAR ADENOMA(S) - NEGATIVE FOR  HIGH-GRADE DYSPLASIA OR MALIGNANCY 3. Surgical [P], colon nos, random sites - COLONIC MUCOSA WITH NO SPECIFIC HISTOPATHOLOGIC CHANGES - NEGATIVE FOR ACUTE INFLAMMATION, INCREASED INTRAEPITHELIAL LYMPHOCYTES OR THICKENED SUBEPITHELIAL COLLAGEN TABLE  Laboratory Studies  Reviewed those in epic  Imaging Studies  August 2023 MRI/MRCP IMPRESSION: 1. 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 However, would suggest further evaluation with direct fluid sampling, if not previously performed, as mucinous cyst pancreatic neoplasm remains a pertinent differential consideration. At a minimum continued imaging surveillance recommended at 12 months by MRCP with and without intravenous contrast material. 2. Complex exophytic right upper pole renal lesion with peripheral rim calcifications and intrinsic mild T1 hyperintensity but no evidence of suspicious postcontrast enhancement on subtraction imaging is most consistent with a Bosniak category 2 benign hemorrhagic/proteinaceous renal cyst. While this lesion does not require independent imaging follow-up, given its complexity with would suggest attention on follow-up imaging. 3. Small hiatal hernia with distal esophageal wall thickening consistent with patient's biopsy-proven esophagitis/Barretts esophagus. 4. Cholelithiasis without findings of acute cholecystitis. 5. Colonic diverticulosis without findings of acute diverticulitis.   ASSESSMENT  Ms. Ju is a 66 y.o. female with a pmh significant for hypertension, anxiety, MDD, prior VTE/PE, family history pancreas cancer (mother), gastroduodenitis, PUD leading to perforation with exploratory laparotomy and patching, duodenal stenosis, duodenal ulcer Barrett's esophagus, pancreatic cyst (low CEA and  mild amylase so not clearly MCN or IPMN), chronic abdominal pain, chronic diarrhea/?IBS-D, colon polyps (TA's).  The patient is seen today for evaluation and management of:  1. Chronic diarrhea   2. Irritable bowel syndrome with diarrhea   3. Fecal urgency   4. RUQ pain   5. Pancreatic cyst   6. Duodenal ulcer    The patient remains hemodynamically stable today.  Clinically, she continues to experience GI issues which I suspect is due to ongoing issues of a chronically nonhealing duodenal ulcer.  She only takes her PPI and Carafate when she was having symptoms of nausea and pain so I suspect that she has not healed this completely.  I have asked her to be very consistent with taking her PPI and Carafate at this point and hopefully that will allow things to be even better in the coming weeks.  I recommend we repeat upper endoscopy in at least 2 to 3 months to ensure healing of that area.  We will also make sure that there is no evidence of progressive stenosis that could require further enteral stenting with long AXIOS.  In regards to the patient's diarrheal symptoms, during the work-up at this point has been unremarkable we will plan to do SIBO breath testing but I suspect she likely has irritable bowel with diarrhea.  We will initiate therapy depending on the SIBO breath testing.  I will have her for the next few weeks to Imodium 4 mg in the morning and up to another 8 mg during the course of the day to see if that makes any difference.  If within a few weeks it is not making a difference then she will let us know and we will transition her to Lomotil to see if that makes a difference but we  are awaiting her SIBO breath testing.  We will consider Xifaxan therapy even if SIBO breath testing is negative, for IBS-D.  I plan a 1 year follow-up MRI/MRCP to follow-up the pancreatic cysts.  We will consider the role of repeat aspiration based on how things look at that time.  All patient questions were answered  to the best of my ability, and the patient agrees to the aforementioned plan of action with follow-up as indicated.   PLAN  Proceed with SIBO breath testing Consider empiric IBS-D treatment with Xifaxan in near future Loperamide 4 mg in the morning and up to another 8 mg during the course of the day - If no improvement after 3 weeks in regards to bowels then patient will let us know and we will transition to Lomotil Continue PPI twice daily Continue Carafate twice daily Repeat endoscopy in 2 to 3 months to evaluate duodenal ulcer/stenosis Repeat colonoscopy in 2026 MRI/MRCP in August 2024 for follow-up of pancreatic cyst    No orders of the defined types were placed in this encounter.   New Prescriptions   No medications on file   Modified Medications   No medications on file    Planned Follow Up No follow-ups on file.   Total Time in Face-to-Face and in Coordination of Care for patient including independent/personal interpretation/review of prior testing, medical history, examination, medication adjustment, communicating results with the patient directly, and documentation within the EHR is 25 minutes.   Justice Britain, MD [facility] Office # [phone]

## 2022-06-23 NOTE — Patient Instructions (Signed)
Trial of Imodium -Take 2 mg by mouth four times daily. ( Up to 12 mg total a day)   If no improvement in your symptoms after 2 weeks, then let us know and Dr. Rush Landmark will consider sending prescription for Lomotil.   You will be due for a recall EGD in Feb 2024. We will send you a reminder in the mail when it gets closer to that time.  We will contact you once we have SIBO test available here at the once. You will need to come by office and pick up kit.   _______________________________________________________  If you are age 63 or older, your body mass index should be between 23-30. Your Body mass index is 31.41 kg/m. If this is out of the aforementioned range listed, please consider follow up with your Primary Care Provider.  If you are age 79 or younger, your body mass index should be between 19-25. Your Body mass index is 31.41 kg/m. If this is out of the aformentioned range listed, please consider follow up with your Primary Care Provider.   ________________________________________________________  The [place] [facility] providers would like to encourage you to use [facility] to communicate with providers for non-urgent requests or questions.  Due to long hold times on the telephone, sending your provider a message by [facility] may be a faster and more efficient way to get a response.  Please allow 48 business hours for a response.  Please remember that this is for non-urgent requests.  _______________________________________________________  Thank you for choosing me and [facility].  Dr. Rush Landmark

## 2022-06-26 ENCOUNTER — Encounter: Payer: Self-pay | Admitting: Gastroenterology

## 2022-06-26 DIAGNOSIS — R1011 Right upper quadrant pain: Secondary | ICD-10-CM | POA: Insufficient documentation

## 2022-06-26 DIAGNOSIS — K580 Irritable bowel syndrome with diarrhea: Secondary | ICD-10-CM | POA: Insufficient documentation

## 2022-06-26 DIAGNOSIS — R152 Fecal urgency: Secondary | ICD-10-CM | POA: Insufficient documentation

## 2022-07-03 ENCOUNTER — Emergency Department (HOSPITAL_BASED_OUTPATIENT_CLINIC_OR_DEPARTMENT_OTHER)
Admission: EM | Admit: 2022-07-03 | Discharge: 2022-07-03 | Disposition: A | Discharge status: Home / Self Care | Payer: Medicare Other | Attending: Emergency Medicine | Admitting: Emergency Medicine

## 2022-07-03 ENCOUNTER — Emergency Department (HOSPITAL_BASED_OUTPATIENT_CLINIC_OR_DEPARTMENT_OTHER): Payer: Medicare Other | Admitting: Radiology

## 2022-07-03 ENCOUNTER — Encounter (HOSPITAL_BASED_OUTPATIENT_CLINIC_OR_DEPARTMENT_OTHER): Payer: Self-pay | Admitting: Emergency Medicine

## 2022-07-03 DIAGNOSIS — R1110 Vomiting, unspecified: Secondary | ICD-10-CM | POA: Insufficient documentation

## 2022-07-03 DIAGNOSIS — R109 Unspecified abdominal pain: Secondary | ICD-10-CM | POA: Insufficient documentation

## 2022-07-03 DIAGNOSIS — R0602 Shortness of breath: Secondary | ICD-10-CM | POA: Insufficient documentation

## 2022-07-03 DIAGNOSIS — Z1152 Encounter for screening for COVID-19: Secondary | ICD-10-CM | POA: Insufficient documentation

## 2022-07-03 DIAGNOSIS — R5381 Other malaise: Secondary | ICD-10-CM | POA: Diagnosis not present

## 2022-07-03 DIAGNOSIS — R0982 Postnasal drip: Secondary | ICD-10-CM | POA: Diagnosis not present

## 2022-07-03 DIAGNOSIS — R0789 Other chest pain: Secondary | ICD-10-CM | POA: Diagnosis not present

## 2022-07-03 DIAGNOSIS — Z79899 Other long term (current) drug therapy: Secondary | ICD-10-CM | POA: Insufficient documentation

## 2022-07-03 DIAGNOSIS — K921 Melena: Secondary | ICD-10-CM | POA: Insufficient documentation

## 2022-07-03 DIAGNOSIS — R051 Acute cough: Secondary | ICD-10-CM | POA: Diagnosis not present

## 2022-07-03 LAB — CBC
HCT: 43.9 % (ref 36.0–46.0)
Hemoglobin: 14.8 g/dL (ref 12.0–15.0)
MCH: 30.5 pg (ref 26.0–34.0)
MCHC: 33.7 g/dL (ref 30.0–36.0)
MCV: 90.5 fL (ref 80.0–100.0)
Platelets: 182 10*3/uL (ref 150–400)
RBC: 4.85 MIL/uL (ref 3.87–5.11)
RDW: 13.4 % (ref 11.5–15.5)
WBC: 6.1 10*3/uL (ref 4.0–10.5)
nRBC: 0 % (ref 0.0–0.2)

## 2022-07-03 LAB — COMPREHENSIVE METABOLIC PANEL
ALT: 9 U/L (ref 0–44)
AST: 22 U/L (ref 15–41)
Albumin: 4.9 g/dL (ref 3.5–5.0)
Alkaline Phosphatase: 52 U/L (ref 38–126)
Anion gap: 13 (ref 5–15)
BUN: 10 mg/dL (ref 8–23)
CO2: 26 mmol/L (ref 22–32)
Calcium: 10.1 mg/dL (ref 8.9–10.3)
Chloride: 102 mmol/L (ref 98–111)
Creatinine, Ser: 0.87 mg/dL (ref 0.44–1.00)
GFR, Estimated: >60 mL/min (ref >60)
Glucose, Bld: 110 mg/dL — ABNORMAL HIGH (ref 70–99)
Potassium: 3.8 mmol/L (ref 3.5–5.1)
Sodium: 141 mmol/L (ref 135–145)
Total Bilirubin: 0.5 mg/dL (ref 0.3–1.2)
Total Protein: 8.2 g/dL — ABNORMAL HIGH (ref 6.5–8.1)

## 2022-07-03 LAB — RESP PANEL BY RT-PCR (FLU A&B, COVID) ARPGX2
Influenza A by PCR: NEGATIVE
Influenza B by PCR: NEGATIVE
SARS Coronavirus 2 by RT PCR: NEGATIVE

## 2022-07-03 LAB — TROPONIN I (HIGH SENSITIVITY)
Troponin I (High Sensitivity): <2 ng/L (ref <18)
Troponin I (High Sensitivity): 4 ng/L (ref <18)

## 2022-07-03 LAB — LIPASE, BLOOD: Lipase: 24 U/L (ref 11–51)

## 2022-07-03 MED ORDER — ONDANSETRON HCL 4 MG/2ML IJ SOLN
4.0000 mg | Freq: Once | INTRAMUSCULAR | Status: AC
  Administered 2022-07-03: 4 mg via INTRAVENOUS
  Filled 2022-07-03: qty 2

## 2022-07-03 MED ORDER — IPRATROPIUM-ALBUTEROL 0.5-2.5 (3) MG/3ML IN SOLN
3.0000 mL | Freq: Once | RESPIRATORY_TRACT | Status: AC
  Administered 2022-07-03: 3 mL via RESPIRATORY_TRACT
  Filled 2022-07-03: qty 3

## 2022-07-03 MED ORDER — ALBUTEROL SULFATE HFA 108 (90 BASE) MCG/ACT IN AERS: 1.0000 | INHALATION_SPRAY | Freq: Four times a day (QID) | RESPIRATORY_TRACT | 0 refills | Status: AC | PRN

## 2022-07-03 MED ORDER — GUAIFENESIN-CODEINE 100-10 MG/5ML PO SOLN
5.0000 mL | Freq: Once | ORAL | Status: AC
  Administered 2022-07-03: 5 mL via ORAL
  Filled 2022-07-03: qty 5

## 2022-07-03 MED ORDER — SODIUM CHLORIDE 0.9 % IV BOLUS
1000.0000 mL | Freq: Once | INTRAVENOUS | Status: AC
Start: 2022-07-03 — End: 2022-07-03
  Administered 2022-07-03: 1000 mL via INTRAVENOUS

## 2022-07-03 MED ORDER — PSEUDOEPHEDRINE HCL 60 MG PO TABS: 60.0000 mg | ORAL_TABLET | Freq: Three times a day (TID) | ORAL | 0 refills | Status: AC | PRN

## 2022-07-03 MED ORDER — ACETAMINOPHEN 325 MG PO TABS
650.0000 mg | ORAL_TABLET | Freq: Once | ORAL | Status: AC
  Administered 2022-07-03: 650 mg via ORAL
  Filled 2022-07-03: qty 2

## 2022-07-03 MED ORDER — BENZONATATE 100 MG PO CAPS: 100.0000 mg | ORAL_CAPSULE | Freq: Three times a day (TID) | ORAL | 0 refills | Status: AC

## 2022-07-03 MED ORDER — KETOROLAC TROMETHAMINE 15 MG/ML IJ SOLN
15.0000 mg | Freq: Once | INTRAMUSCULAR | Status: AC
  Administered 2022-07-03: 15 mg via INTRAVENOUS
  Filled 2022-07-03: qty 1

## 2022-07-03 MED ORDER — BENZONATATE 100 MG PO CAPS
200.0000 mg | ORAL_CAPSULE | Freq: Once | ORAL | Status: AC
Start: 2022-07-03 — End: 2022-07-03
  Administered 2022-07-03: 200 mg via ORAL
  Filled 2022-07-03: qty 2

## 2022-07-03 MED ORDER — OXYMETAZOLINE HCL 0.05 % NA SOLN: 1.0000 | Freq: Two times a day (BID) | NASAL | 0 refills | Status: AC

## 2022-07-03 NOTE — ED Provider Notes (Signed)
[facility] DEPT Provider Note   CSN: [id number] Arrival date & time: 07/03/22  1121     History  Chief Complaint  Patient presents with   Emesis   Shortness of Breath    Maria Murphy is a 66 y.o. female.  Patient as above with significant medical history as below, including Follows with [facility] Dr Rush Landmark History of chronic diarrhea, IBS MDD, anxiety, hypertension  Gastroenterology started her on Xifaxin, PPI, loperamide, carafate.    EGD 3/23  with gastritis, hiatal hernia, non-bleeding gastric ulcers noted, duodenal deformity with stenosis.    who presents to the ED with complaint of cough, dib, chest tightness, post nasal drip, malaise. She has been having post nasal drip the last few days, not improved with benadryl at home, did not try any other medications. She has been having ongoing cough the last couple days, worsened today. She has not  tried any anti-tussive medications. No sick contacts, no change to her typical bowel/bladder activity, no fevers or chills. She had a few episodes of post tussive emesis. No BRBPR or melena reported. She is having some mild abdominal pain but only when she is coughing. She is having chest tightness and mild chest pain, often a/w coughing but also having some discomfort when she is not coughing, mild dib. Cough intermittently productive with clear or yellow or brown sputum.      Past Medical History:  Diagnosis Date   Acute saddle pulmonary embolism without acute cor pulmonale (HCC)    Basal cell carcinoma 01/16/2021   L nose, MOHs 04/14/21   Collagen vascular disease (HCC)    Duodenal ulcer    Essential hypertension, benign 01/24/2015   Family history of ovarian cancer 01/06/2021   Family history of pancreatic cancer 01/06/2021   Family history of uterine cancer 01/06/2021   Foot ulcer (HCC)    GERD (gastroesophageal reflux disease)    Hypertension    Memory loss    Peptic ulcer with perforation  (HCC)    Perforated abdominal viscus 04/22/2020   PUD (peptic ulcer disease)    with perforation s/p exploratory laparotomy   Pulmonary embolus ([place]) 04/2020   Skin cancer    Suicidal ideations 08/07/2020   Vision abnormalities     Past Surgical History:  Procedure Laterality Date   BILIARY DILATION  08/21/2021   Procedure: DUODENAL DILATION;  Surgeon: Irving Copas., MD;  Location: [facility];  Service: Gastroenterology;;   BIOPSY  11/20/2020   Procedure: BIOPSY;  Surgeon: Irving Copas., MD;  Location: [facility];  Service: Gastroenterology;;   BIOPSY  08/21/2021   Procedure: BIOPSY;  Surgeon: Irving Copas., MD;  Location: [facility];  Service: Gastroenterology;;   CENTRAL LINE INSERTION Right 04/22/2020   Procedure: CENTRAL LINE INSERTION;  Surgeon: Virl Cagey, MD;  Location: [facility];  Service: General;  Laterality: Right;   DIAGNOSTIC LAPAROSCOPY     ESOPHAGOGASTRODUODENOSCOPY N/A 11/20/2020   Procedure: ESOPHAGOGASTRODUODENOSCOPY (EGD);  Surgeon: Irving Copas., MD;  Location: [facility];  Service: Gastroenterology;  Laterality: N/A;   ESOPHAGOGASTRODUODENOSCOPY (EGD) WITH PROPOFOL N/A 07/10/2020   Procedure: ESOPHAGOGASTRODUODENOSCOPY (EGD) WITH PROPOFOL;  Surgeon: Rogene Houston, MD;  [facility];  Service: Endoscopy;  Laterality: N/A;   ESOPHAGOGASTRODUODENOSCOPY (EGD) WITH PROPOFOL N/A 10/18/2020   Procedure: ESOPHAGOGASTRODUODENOSCOPY (EGD) WITH PROPOFOL;  Surgeon: Harvel Quale, MD;  Location: [facility];  Service: Gastroenterology;  Laterality: N/A;   ESOPHAGOGASTRODUODENOSCOPY (EGD) WITH PROPOFOL N/A 08/21/2021   Procedure: ESOPHAGOGASTRODUODENOSCOPY (  EGD) WITH PROPOFOL;  Surgeon: Mansouraty, Telford Nab., MD;  Location: [place] [facility];  Service: Gastroenterology;  Laterality: N/A;   EUS N/A 11/20/2020   Procedure: UPPER ENDOSCOPIC ULTRASOUND (EUS) LINEAR;  Surgeon: Irving Copas., MD;   Location: [facility];  Service: Gastroenterology;  Laterality: N/A;   EUS  08/21/2021   Procedure: UPPER ENDOSCOPIC ULTRASOUND (EUS) LINEAR;  Surgeon: Irving Copas., MD;  Location: [facility];  Service: Gastroenterology;;   FINE NEEDLE ASPIRATION  08/21/2021   Procedure: FINE NEEDLE ASPIRATION (FNA) LINEAR;  Surgeon: Irving Copas., MD;  Location: [place];  Service: Gastroenterology;;   Toniann Ket  04/22/2020   Procedure: Toniann Ket;  Surgeon: Virl Cagey, MD;  Location: [facility];  Service: General;;   IR CATHETER TUBE CHANGE  05/09/2020   LAPAROTOMY N/A 04/22/2020   Procedure: EXPLORATORY LAPAROTOMY;  Surgeon: Virl Cagey, MD;  Location: [facility];  Service: General;  Laterality: N/A;   TONSILLECTOMY       The history is provided by the patient. No language interpreter was used.  Emesis Associated symptoms: abdominal pain and cough   Associated symptoms: no fever and no headaches   Shortness of Breath Associated symptoms: abdominal pain, chest pain, cough and vomiting   Associated symptoms: no fever, no headaches and no rash        Home Medications Prior to Admission medications   Medication Sig Start Date End Date Taking? Authorizing Provider  albuterol (VENTOLIN HFA) 108 (90 Base) MCG/ACT inhaler Inhale 1-2 puffs into the lungs every 6 (six) hours as needed for wheezing or shortness of breath. 07/03/22  Yes Wynona Dove A, DO  benzonatate (TESSALON) 100 MG capsule Take 1 capsule (100 mg total) by mouth every 8 (eight) hours. 07/03/22  Yes Wynona Dove A, DO  oxymetazoline (AFRIN NASAL SPRAY) 0.05 % nasal spray Place 1 spray into both nostrils 2 (two) times daily for 3 days. 07/03/22 07/06/22 Yes Wynona Dove A, DO  pseudoephedrine (SUDAFED) 60 MG tablet Take 1 tablet (60 mg total) by mouth every 8 (eight) hours as needed for congestion. 07/03/22  Yes Jeanell Sparrow, DO  acetaminophen (TYLENOL) 500 MG tablet Take 1 tablet (500 mg total) by  mouth every 6 (six) hours as needed for mild pain or fever. 09/27/20   Johnson, Clanford L, MD  amLODipine (NORVASC) 10 MG tablet Take 1 tablet (10 mg total) by mouth daily. For BP 10/29/21   Elsie Stain, MD  Ascorbic Acid (VITAMIN C PO) Take 1 tablet by mouth daily.    [provider]  carvedilol (COREG) 12.5 MG tablet Take 1 tablet (12.5 mg total) by mouth 2 (two) times daily with a meal. 10/29/21   Elsie Stain, MD  citalopram (CELEXA) 40 MG tablet Take 1 tablet (40 mg total) by mouth at bedtime. 10/29/21   Elsie Stain, MD  donepezil (ARICEPT) 10 MG tablet TAKE 1 TABLET BY MOUTH IN THE MORNING AND AT BEDTIME 02/17/22   Elsie Stain, MD  DULoxetine HCl 40 MG CPEP Take 1 capsule by mouth daily. 10/29/21   Elsie Stain, MD  ferrous sulfate 325 (65 FE) MG tablet Take 325 mg by mouth in the morning. 02/27/21   [provider]  gabapentin (NEURONTIN) 300 MG capsule Take 1 capsule (300 mg total) by mouth 2 (two) times daily. 10/29/21   Elsie Stain, MD  lactose free nutrition (BOOST) LIQD Take 237 mLs by mouth 2 (two) times daily. Boost Breeze Clear  [provider]  lansoprazole (PREVACID SOLUTAB) 30 MG disintegrating tablet Place 1 tablet on tongue twice daily and allow to dissolve until particles can be swallowed. Do not swallow whole, break, cut, or chew tablet. 10/29/21   Elsie Stain, MD  lisinopril-hydrochlorothiazide (ZESTORETIC) 10-12.5 MG tablet Take 1 tablet by mouth daily. 12/15/21   Marylu Lund., NP  loperamide (IMODIUM) 2 MG capsule Take 1 capsule (2 mg total) by mouth 2 (two) times daily as needed for diarrhea or loose stools. 03/11/21   Jaynee Eagles, PA-C  promethazine (PHENERGAN) 12.5 MG tablet Take 1 tablet (12.5 mg total) by mouth every 8 (eight) hours as needed for nausea or vomiting. 10/29/21   Elsie Stain, MD  QUEtiapine (SEROQUEL) 50 MG tablet Take 1 tablet (50 mg total) by mouth at bedtime. 10/29/21   Elsie Stain, MD   RABEprazole (ACIPHEX) 20 MG tablet Take 1 tablet (20 mg total) by mouth 2 (two) times daily. 10/30/21   Mansouraty, Telford Nab., MD  sucralfate (CARAFATE) 1 GM/10ML suspension Take 10 mLs (1 g total) by mouth 4 (four) times daily -  with meals and at bedtime. 10/29/21   Elsie Stain, MD  tiZANidine (ZANAFLEX) 4 MG tablet Take 1 tablet (4 mg total) by mouth every 8 (eight) hours as needed for muscle spasms. 10/29/21   Elsie Stain, MD  traMADol (ULTRAM) 50 MG tablet Take 1 tablet (50 mg total) by mouth every 8 (eight) hours as needed. 10/29/21   Elsie Stain, MD  traZODone (DESYREL) 100 MG tablet Take 1 tablet (100 mg total) by mouth at bedtime. 10/29/21   Elsie Stain, MD  tretinoin (RETIN-A) 0.025 % cream Apply a pea sized amount to the entire face QHS. 01/14/22   Ralene Bathe, MD      Allergies    Wheat extract    Review of Systems   Review of Systems  Constitutional:  Positive for fatigue. Negative for activity change and fever.  HENT:  Negative for facial swelling and trouble swallowing.   Eyes:  Negative for discharge and redness.  Respiratory:  Positive for cough, chest tightness and shortness of breath.   Cardiovascular:  Positive for chest pain. Negative for palpitations.  Gastrointestinal:  Positive for abdominal pain and vomiting. Negative for nausea.  Genitourinary:  Negative for dysuria and flank pain.  Musculoskeletal:  Negative for back pain and gait problem.  Skin:  Negative for pallor and rash.  Neurological:  Negative for syncope and headaches.    Physical Exam Updated Vital Signs BP (!) 159/84   Pulse 71   Temp 98.1 F (36.7 C)   Resp 13   Ht '5\' 4"'$  (1.626 m)   Wt 81.6 kg   SpO2 97%   BMI 30.90 kg/m  Physical Exam Vitals and nursing note reviewed.  Constitutional:      General: She is not in acute distress.    Appearance: Normal appearance. She is well-developed.     Comments: Multiple episodes of coughing during assessment  HENT:     Head:  Normocephalic and atraumatic.     Right Ear: External ear normal.     Left Ear: External ear normal.     Nose: Nose normal.     Mouth/Throat:     Mouth: Mucous membranes are moist.  Eyes:     General: No scleral icterus.       Right eye: No discharge.        Left eye:  No discharge.  Cardiovascular:     Rate and Rhythm: Normal rate and regular rhythm.     Pulses: Normal pulses.     Heart sounds: Normal heart sounds.  Pulmonary:     Effort: Pulmonary effort is normal. Tachypnea present. No respiratory distress.     Breath sounds: Normal breath sounds. No decreased breath sounds or wheezing.  Abdominal:     General: Abdomen is flat.     Palpations: Abdomen is soft.     Tenderness: There is no abdominal tenderness. There is no guarding or rebound.  Musculoskeletal:        General: Normal range of motion.     Cervical back: Normal range of motion.     Right lower leg: No edema.     Left lower leg: No edema.  Skin:    General: Skin is warm and dry.     Capillary Refill: Capillary refill takes less than 2 seconds.  Neurological:     Mental Status: She is alert and oriented to person, place, and time.     GCS: GCS eye subscore is 4. GCS verbal subscore is 5. GCS motor subscore is 6.  Psychiatric:        Mood and Affect: Mood normal.        Behavior: Behavior normal.     ED Results / Procedures / Treatments   Labs (all labs ordered are listed, but only abnormal results are displayed) Labs Reviewed  COMPREHENSIVE METABOLIC PANEL - Abnormal; Notable for the following components:      Result Value   Glucose, Bld 110 (*)    Total Protein 8.2 (*)    All other components within normal limits  RESP PANEL BY RT-PCR (FLU A&B, COVID) ARPGX2  LIPASE, BLOOD  CBC  TROPONIN I (HIGH SENSITIVITY)  TROPONIN I (HIGH SENSITIVITY)    EKG EKG Interpretation  Date/Time:  Friday July 03 2022 12:30:09 EST Ventricular Rate:  49 PR Interval:  146 QRS Duration: 84 QT Interval:  432 QTC  Calculation: 390 R Axis:   -29 Text Interpretation: Sinus bradycardia Minimal voltage criteria for LVH, may be normal variant ( R in aVL ) Possible Anterior infarct , age undetermined Abnormal ECG When compared with ECG of 25-Sep-2021 01:44, PREVIOUS ECG IS PRESENT similar to prior tracing 2/23 Confirmed by Wynona Dove ([id number]) on 07/03/2022 3:24:49 PM  Radiology DG Chest 2 View  Result Date: 07/03/2022 CLINICAL DATA:  Shortness of breath. EXAM: CHEST - 2 VIEW COMPARISON:  September 25, 2021 FINDINGS: Cardiomediastinal silhouette is normal. Mediastinal contours appear intact. There is no evidence of focal airspace consolidation, pleural effusion or pneumothorax. Mild interstitial thickening. Osseous structures are without acute abnormality. Soft tissues are grossly normal. IMPRESSION: Mild interstitial thickening, usually seen with pulmonary vascular congestion. Electronically Signed   By: Fidela Salisbury M.D.   On: 07/03/2022 12:40    Procedures Procedures    Medications Ordered in ED Medications  benzonatate (TESSALON) capsule 200 mg (has no administration in time range)  ipratropium-albuterol (DUONEB) 0.5-2.5 (3) MG/3ML nebulizer solution 3 mL (3 mLs Nebulization Given 07/03/22 1607)  guaiFENesin-codeine 100-10 MG/5ML solution 5 mL (5 mLs Oral Given 07/03/22 1534)  sodium chloride 0.9 % bolus 1,000 mL (0 mLs Intravenous Stopped 07/03/22 1741)  acetaminophen (TYLENOL) tablet 650 mg (650 mg Oral Given 07/03/22 1617)  ondansetron (ZOFRAN) injection 4 mg (4 mg Intravenous Given 07/03/22 1535)  ketorolac (TORADOL) 15 MG/ML injection 15 mg (15 mg Intravenous Given 07/03/22 1822)    ED  Course/ Medical Decision Making/ A&P                           Medical Decision Making Risk OTC drugs. Prescription drug management.   This patient presents to the ED with chief complaint(s) of cough, dib, cp, post tussive emesis with pertinent past medical history of as above which further complicates  the presenting complaint. The complaint involves an extensive differential diagnosis and also carries with it a high risk of complications and morbidity.    The differential diagnosis includes but not limited to Differential includes all life-threatening causes for chest pain. This includes but is not exclusive to acute coronary syndrome, aortic dissection, pulmonary embolism, cardiac tamponade, community-acquired pneumonia, pericarditis, musculoskeletal chest wall pain, etc.  In my evaluation of this patient's dyspnea my DDx includes, but is not limited to, pneumonia, pulmonary embolism, pneumothorax, pulmonary edema, metabolic acidosis, asthma, COPD, cardiac cause, anemia, anxiety, etc.   . Serious etiologies were considered.   The initial plan is to screening labs, cxr, give duoneb, anti tussive, tylenol, ivf   Additional history obtained: Additional history obtained from family Records reviewed Primary Care Documents and prior labs/imaging/home meds  Independent labs interpretation:  The following labs were independently interpreted:  CBC unremarkable, CMP unremarkable.  Lipase negative, RVP negative, Trope negative x2  Independent visualization of imaging: - I independently visualized the following imaging with scope of interpretation limited to determining acute life threatening conditions related to emergency care: c x-ray, which revealed no acute process  Cardiac monitoring was reviewed and interpreted by myself which shows NSR  Treatment and Reassessment: IV fluids, Toradol, Tylenol, DuoNeb, Zofran, antitussive >> Symptoms improved, cough improved  Consultation: - Consulted or discussed management/test interpretation w/ external professional: na  Consideration for admission or further workup: Admission was considered   Patient w/ acute cough, favor provoked by postnasal drip, URI.  Chest discomfort likely due to coughing, given negative EKG and negative troponins feel ACS  is less likely. S He is HDS, breathing comfortably on ambient air.  P.o. intake without difficulty.  Recommend further outpatient management of likely viral syndrome, close follow-up with PCP.  Discussed supportive care at home.  The patient improved significantly and was discharged in stable condition. Detailed discussions were had with the patient regarding current findings, and need for close f/u with PCP or on call doctor. The patient has been instructed to return immediately if the symptoms worsen in any way for re-evaluation. Patient verbalized understanding and is in agreement with current care plan. All questions answered prior to discharge.    Social Determinants of health: Social History   Tobacco Use   Smoking status: Never   Smokeless tobacco: Never  Vaping Use   Vaping Use: Never used  Substance Use Topics   Alcohol use: Yes    Alcohol/week: 2.0 - 3.0 standard drinks of alcohol    Types: 2 - 3 Glasses of wine per week   Drug use: No            Final Clinical Impression(s) / ED Diagnoses Final diagnoses:  Acute cough  Post-nasal drip    Rx / DC Orders ED Discharge Orders          Ordered    benzonatate (TESSALON) 100 MG capsule  Every 8 hours        07/03/22 2059    albuterol (VENTOLIN HFA) 108 (90 Base) MCG/ACT inhaler  Every 6 hours PRN  07/03/22 2059    pseudoephedrine (SUDAFED) 60 MG tablet  Every 8 hours PRN        07/03/22 2059    oxymetazoline (AFRIN NASAL SPRAY) 0.05 % nasal spray  2 times daily        07/03/22 2059              Jeanell Sparrow, DO 07/03/22 2103

## 2022-07-03 NOTE — ED Triage Notes (Signed)
Pt sent from dr office c/o vomiting, diarrhea, cough and SOB. Neg COVID

## 2022-07-03 NOTE — Discharge Instructions (Addendum)
It was a pleasure caring for you today in the emergency department.  Consider drinking hot tea with honey as tolerated to help alleviate your cough.  Please return to the emergency department for any worsening or worrisome symptoms.

## 2022-07-22 ENCOUNTER — Telehealth: Payer: Self-pay | Admitting: *Deleted

## 2022-07-22 NOTE — Telephone Encounter (Signed)
   Pre-operative Risk Assessment    Patient Name: Maria Murphy  DOB: 1956/05/08 MRN: [id number]      Request for Surgical Clearance    Procedure:   RIGHT TOTAL KNEE REPLACEMENT   Date of Surgery:  Clearance TBD                                 Surgeon:  DR. Anice Paganini Surgeon's Group or Practice Name:  [facility] Phone number:  [phone] EXT [phone] Fax number:  [phone]   Type of Clearance Requested:   - Medical ; NO MEDICATIONS LISTED AS NEEDING TO BE HELD   Type of Anesthesia:  Spinal   Additional requests/questions:    Maria Murphy   07/22/2022, 12:51 PM

## 2022-07-22 NOTE — Telephone Encounter (Signed)
    Primary Cardiologist:None  Chart reviewed as part of pre-operative protocol coverage. Because of Maria Murphy's past medical history and time since last visit, he/she will require a follow-up visit in order to better assess preoperative cardiovascular risk.  Pre-op covering staff: - Please schedule appointment and call patient to inform them. - Please contact requesting surgeon's office via preferred method (i.e, phone, fax) to inform them of need for appointment prior to surgery.  If applicable, this message will also be routed to pharmacy pool and/or primary cardiologist for input on holding anticoagulant/antiplatelet agent as requested below so that this information is available at time of patient's appointment.   Deberah Pelton, NP  07/22/2022, 1:16 PM

## 2022-07-24 NOTE — Telephone Encounter (Signed)
Left message to call back to schedule an in office appt for pre op clearance. Looks like primary card may be Dr. Marlou Porch. Pt can APP as well.

## 2022-07-28 NOTE — Telephone Encounter (Signed)
Have left message again for the pt to call back to schedule in office appt for pre op clearance. We have attempted x 3 to reach the pt. I will send FYI to requesting office pt needs in office appt with the cardiologist a APP before she can be cleared. I will remove from the pre op call back pool at this time.

## 2022-08-31 ENCOUNTER — Encounter (HOSPITAL_COMMUNITY): Payer: Self-pay | Admitting: Hematology

## 2022-09-08 ENCOUNTER — Ambulatory Visit (HOSPITAL_COMMUNITY): Payer: Medicare HMO | Attending: Family Medicine | Admitting: Physical Therapy

## 2022-09-08 DIAGNOSIS — M5459 Other low back pain: Secondary | ICD-10-CM | POA: Insufficient documentation

## 2022-09-08 DIAGNOSIS — R2689 Other abnormalities of gait and mobility: Secondary | ICD-10-CM | POA: Diagnosis present

## 2022-09-08 NOTE — Therapy (Signed)
OUTPATIENT PHYSICAL THERAPY THORACOLUMBAR EVALUATION   Patient Name: Maria Murphy MRN: [id number] DOB:11-14-1955, 67 y.o., female Today's Date: 09/08/2022  END OF SESSION:  PT End of Session - 09/08/22 1023     Visit Number 1    Number of Visits 12    Date for PT Re-Evaluation 10/20/22    Authorization Type [employer]    Authorization Time Period check auth    Authorization - Visit Number 1    Authorization - Number of Visits 1    Progress Note Due on Visit 10    PT Start Time [phone]    PT Stop Time 1026    PT Time Calculation (min) 38 min    Activity Tolerance Patient tolerated treatment well    Behavior During Therapy WFL for tasks assessed/performed             Past Medical History:  Diagnosis Date   Acute saddle pulmonary embolism without acute cor pulmonale (HCC)    Basal cell carcinoma 01/16/2021   L nose, MOHs 04/14/21   Collagen vascular disease ([place])    Duodenal ulcer    Essential hypertension, benign 01/24/2015   Family history of ovarian cancer 01/06/2021   Family history of pancreatic cancer 01/06/2021   Family history of uterine cancer 01/06/2021   Foot ulcer (HCC)    GERD (gastroesophageal reflux disease)    Hypertension    Memory loss    Peptic ulcer with perforation (HCC)    Perforated abdominal viscus 04/22/2020   PUD (peptic ulcer disease)    with perforation s/p exploratory laparotomy   Pulmonary embolus ([place]) 04/2020   Skin cancer    Suicidal ideations 08/07/2020   Vision abnormalities    Past Surgical History:  Procedure Laterality Date   BILIARY DILATION  08/21/2021   Procedure: DUODENAL DILATION;  Surgeon: Irving Copas., MD;  Location: [place];  Service: Gastroenterology;;   BIOPSY  11/20/2020   Procedure: BIOPSY;  Surgeon: Irving Copas., MD;  Location: [facility];  Service: Gastroenterology;;   BIOPSY  08/21/2021   Procedure: BIOPSY;  Surgeon: Irving Copas., MD;  Location: [facility];   Service: Gastroenterology;;   CENTRAL LINE INSERTION Right 04/22/2020   Procedure: CENTRAL LINE INSERTION;  Surgeon: Virl Cagey, MD;  [facility];  Service: General;  Laterality: Right;   DIAGNOSTIC LAPAROSCOPY     ESOPHAGOGASTRODUODENOSCOPY N/A 11/20/2020   Procedure: ESOPHAGOGASTRODUODENOSCOPY (EGD);  Surgeon: Irving Copas., MD;  Location: [facility];  Service: Gastroenterology;  Laterality: N/A;   ESOPHAGOGASTRODUODENOSCOPY (EGD) WITH PROPOFOL N/A 07/10/2020   Procedure: ESOPHAGOGASTRODUODENOSCOPY (EGD) WITH PROPOFOL;  Surgeon: Rogene Houston, MD;  [facility];  Service: Endoscopy;  Laterality: N/A;   ESOPHAGOGASTRODUODENOSCOPY (EGD) WITH PROPOFOL N/A 10/18/2020   Procedure: ESOPHAGOGASTRODUODENOSCOPY (EGD) WITH PROPOFOL;  Surgeon: Harvel Quale, MD;  [facility];  Service: Gastroenterology;  Laterality: N/A;   ESOPHAGOGASTRODUODENOSCOPY (EGD) WITH PROPOFOL N/A 08/21/2021   Procedure: ESOPHAGOGASTRODUODENOSCOPY (EGD) WITH PROPOFOL;  Surgeon: Rush Landmark Telford Nab., MD;  Location: [place];  Service: Gastroenterology;  Laterality: N/A;   EUS N/A 11/20/2020   Procedure: UPPER ENDOSCOPIC ULTRASOUND (EUS) LINEAR;  Surgeon: Irving Copas., MD;  Location: [facility];  Service: Gastroenterology;  Laterality: N/A;   EUS  08/21/2021   Procedure: UPPER ENDOSCOPIC ULTRASOUND (EUS) LINEAR;  Surgeon: Irving Copas., MD;  Location: [facility] ENDOSCOPY;  Service: Gastroenterology;;   FINE NEEDLE ASPIRATION  08/21/2021   Procedure: FINE NEEDLE ASPIRATION (FNA) LINEAR;  Surgeon: Justice Britain  Brooke Bonito., MD;  Location: [place];  Service: Gastroenterology;;   Toniann Ket  04/22/2020   Procedure: Toniann Ket;  Surgeon: Virl Cagey, MD;  [facility];  Service: General;;   IR CATHETER TUBE CHANGE  05/09/2020   LAPAROTOMY N/A 04/22/2020   Procedure: EXPLORATORY LAPAROTOMY;  Surgeon: Virl Cagey, MD;  [facility];  Service: General;  Laterality: N/A;   TONSILLECTOMY     Patient Active Problem List   Diagnosis Date Noted   RUQ pain 06/26/2022   Fecal urgency 06/26/2022   Irritable bowel syndrome with diarrhea 06/26/2022   Palpitations 10/29/2021   Pancreatic cyst 10/08/2021   Abdominal pain, chronic, epigastric 10/08/2021   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   Peptic ulcer disease 10/08/2021   Barrett's esophagus without dysplasia 10/08/2021   History of iron deficiency 10/08/2021   Nausea and vomiting 10/08/2021   Fever 10/08/2021   Anxiety and depression 09/17/2021   Encounter for gynecological examination with Papanicolaou smear of cervix 09/17/2021   Encounter for screening fecal occult blood testing 09/17/2021   Pain, abdominal, RLQ 09/17/2021   Postmenopause 09/17/2021   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   Personal history of other malignant neoplasm of skin 04/14/2021   Severe episode of recurrent major depressive disorder, without psychotic features ([place]) 02/18/2021   Genetic testing 01/14/2021   Family history of pancreatic cancer 01/06/2021   Family history of ovarian cancer 01/06/2021   Family history of uterine cancer 01/06/2021   IDA (iron deficiency anemia) 12/25/2020   Opioid contract exists 12/09/2020   Pancreatic lesion    Chronic abdominal pain    History of DVT (deep vein thrombosis) 11/15/2020   Anemia    History of pulmonary embolus (PE) 10/18/2020   Obesity (BMI 30-39.9) 10/18/2020   GERD (gastroesophageal reflux disease) 10/18/2020   Acquired complex cyst of kidney 10/09/2020   Basal cell carcinoma (BCC) of left side of nose 10/09/2020   Aortic atherosclerosis ([place]) 10/09/2020   Peripheral polyneuropathy 10/09/2020   Chronic diarrhea 09/24/2020   Cholelithiasis 09/05/2020   Primary osteoarthritis of both knees 07/24/2020   Duodenal ulcer    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   Essential hypertension 01/24/2015   Memory loss 01/24/2015   Neck pain  01/24/2015    PCP: Loura Pardon MD  REFERRING PROVIDER: Renette Butters, MD  REFERRING DIAG: M54.50 (ICD-10-CM) - Low back pain  Rationale for Evaluation and Treatment: Rehabilitation  THERAPY DIAG:  Other low back pain - Plan: PT plan of care cert/re-cert  Other abnormalities of gait and mobility - Plan: PT plan of care cert/re-cert  ONSET DATE: Chronic   SUBJECTIVE:  SUBJECTIVE STATEMENT: Patient presents to therapy with complaint of LBP. She reports chronic history of arthritis but notes that back recently has flared up with pain. She was offered injections but would like to do therapy first. She denies taking medication for this issue.   PERTINENT HISTORY:  neuropathy  PAIN:  Are you having pain? Yes: NPRS scale: 7/10 Pain location: low back  Pain description: ache, sharp  Aggravating factors: bending, twisting Relieving factors: meds, laying down  PRECAUTIONS: None  WEIGHT BEARING RESTRICTIONS: No  FALLS:  Has patient fallen in last 6 months? No  LIVING ENVIRONMENT: Lives with: lives alone Lives in: House/apartment Stairs: No Has following equipment at home: None  OCCUPATION: Retired  PLOF: Independent  PATIENT GOALS: Less pain   NEXT MD VISIT:   OBJECTIVE:   DIAGNOSTIC FINDINGS:  NA  PATIENT SURVEYS:  FOTO 45% function    COGNITION: Overall cognitive status: Within functional limits for tasks assessed     SENSATION: WFL   PALPATION: Mod TTP about bilateral ow lumbar paraspinals   LUMBAR ROM:   AROM eval  Flexion FUll  Extension 90% limited   Right lateral flexion 50% limited  Left lateral flexion 50% limited  Right rotation   Left rotation    (Blank rows = not tested)  LOWER EXTREMITY ROM:     Active  Right eval Left eval  Hip flexion     Hip extension    Hip abduction    Hip adduction    Hip internal rotation    Hip external rotation    Knee flexion -5 -5  Knee extension 57 90  Ankle dorsiflexion    Ankle plantarflexion    Ankle inversion    Ankle eversion     (Blank rows = not tested)  LOWER EXTREMITY MMT:    MMT Right eval Left eval  Hip flexion 4 4+  Hip extension 3+ 4  Hip abduction 3+ 4  Hip adduction    Hip internal rotation    Hip external rotation    Knee flexion 5 5  Knee extension 4+ 5  Ankle dorsiflexion 5 5  Ankle plantarflexion    Ankle inversion    Ankle eversion     (Blank rows = not tested)  FUNCTIONAL TESTS:  5 times sit to stand: 19.2 sec with LUE assist and RLE extended  2 minute walk test: 250eet with no AD   GAIT: Decreased stride, trendelenburg, decreased RT knee flexion   TODAY'S TREATMENT:                                                                                                                              DATE:  09/08/22 Eval    PATIENT EDUCATION:  Education details: on Eval findings, POC and HEP  Person educated: Patient Education method: Explanation Education comprehension: verbalized understanding  HOME EXERCISE PROGRAM: Access Code: [email or web address] URL: [email or web address] Date: 09/08/2022 Prepared by: Josue Hector  Exercises - Supine Heel  Slide with Strap  - 2-3 x daily - 7 x weekly - 1-2 sets - 10 reps - 5 sec hold - Supine Lower Trunk Rotation  - 2-3 x daily - 7 x weekly - 1 sets - 10 reps - 10 sec hold - Supine Transversus Abdominis Bracing - Hands on Stomach  - 2-3 x daily - 7 x weekly - 1-2 sets - 10 reps - 5 sec hold  ASSESSMENT:  CLINICAL IMPRESSION: Patient is a 67 y.o. female who presents to physical therapy with complaint of LBP. Patient demonstrates muscle weakness, reduced ROM, and fascial restrictions which are likely contributing to symptoms of pain and are negatively impacting patient ability to perform ADLs and  functional mobility tasks. Patient will benefit from skilled physical therapy services to address these deficits to reduce pain and improve level of function with ADLs and functional mobility tasks.   OBJECTIVE IMPAIRMENTS: Abnormal gait, decreased activity tolerance, decreased balance, decreased mobility, difficulty walking, decreased ROM, decreased strength, hypomobility, improper body mechanics, and pain.   ACTIVITY LIMITATIONS: carrying, lifting, bending, sitting, standing, squatting, stairs, transfers, and locomotion level  PARTICIPATION LIMITATIONS: meal prep, cleaning, laundry, driving, shopping, community activity, and yard work  PERSONAL FACTORS: Past/current experiences and Time since onset of injury/illness/exacerbation are also affecting patient's functional outcome.   REHAB POTENTIAL: Good  CLINICAL DECISION MAKING: Stable/uncomplicated  EVALUATION COMPLEXITY: Low   GOALS: SHORT TERM GOALS: Target date: 09/29/2022  Patient will be independent with initial HEP and self-management strategies to improve functional outcomes Baseline:  Goal status: INITIAL   LONG TERM GOALS: Target date: 10/20/2022  Patient will be independent with advanced HEP and self-management strategies to improve functional outcomes Baseline:  Goal status: INITIAL  2.  Patient will improve FOTO score to predicted value to indicate improvement in functional outcomes Baseline: 45% Goal status: INITIAL  3.  Patient will have bilat knee AROM 5-90 degrees to improve functional mobility and facilitate squatting to pick up items from floor. Baseline: See AROM  Goal status: INITIAL  4. Patient will have equal to or > 4/5 MMT throughout BLE to improve ability to perform functional mobility, stair ambulation and ADLs.  Baseline: See MMT Goal status: INITIAL  5. Patient will be able to ambulate at least 325 feet during 2MWT with LRAD to demonstrate improved ability to perform functional mobility and  associated tasks. Baseline: 250 feet with no AD  Goal status: INITIAL  PLAN:  PT FREQUENCY: 1-2x/week  PT DURATION: 6 weeks  PLANNED INTERVENTIONS: Therapeutic exercises, Therapeutic activity, Neuromuscular re-education, Balance training, Gait training, Patient/Family education, Joint manipulation, Joint mobilization, Stair training, Aquatic Therapy, Dry Needling, Electrical stimulation, Spinal manipulation, Spinal mobilization, Cryotherapy, Moist heat, scar mobilization, Taping, Traction, Ultrasound, Biofeedback, Ionotophoresis '4mg'$ /ml Dexamethasone, and Manual therapy. Marland Kitchen  PLAN FOR NEXT SESSION: Progress hip and core strength as tolerated. Improve knee AROM for gait mechanics   10:26 AM, 09/08/22 Josue Hector PT DPT  Physical Therapist with [facility]  [phone]

## 2022-09-14 NOTE — Therapy (Incomplete)
OUTPATIENT PHYSICAL THERAPY THORACOLUMBAR EVALUATION   Patient Name: Maria Murphy MRN: [id number] DOB:1956-05-10, 67 y.o., female Today's Date: 09/08/2022  END OF SESSION:  PT End of Session - 09/08/22 1023     Visit Number 1    Number of Visits 12    Date for PT Re-Evaluation 10/20/22    Authorization Type [employer]    Authorization Time Period check auth    Authorization - Visit Number 1    Authorization - Number of Visits 1    Progress Note Due on Visit 10    PT Start Time [phone]    PT Stop Time 1026    PT Time Calculation (min) 38 min    Activity Tolerance Patient tolerated treatment well    Behavior During Therapy WFL for tasks assessed/performed             Past Medical History:  Diagnosis Date   Acute saddle pulmonary embolism without acute cor pulmonale (HCC)    Basal cell carcinoma 01/16/2021   L nose, MOHs 04/14/21   Collagen vascular disease ([place])    Duodenal ulcer    Essential hypertension, benign 01/24/2015   Family history of ovarian cancer 01/06/2021   Family history of pancreatic cancer 01/06/2021   Family history of uterine cancer 01/06/2021   Foot ulcer (HCC)    GERD (gastroesophageal reflux disease)    Hypertension    Memory loss    Peptic ulcer with perforation (HCC)    Perforated abdominal viscus 04/22/2020   PUD (peptic ulcer disease)    with perforation s/p exploratory laparotomy   Pulmonary embolus ([place]) 04/2020   Skin cancer    Suicidal ideations 08/07/2020   Vision abnormalities    Past Surgical History:  Procedure Laterality Date   BILIARY DILATION  08/21/2021   Procedure: DUODENAL DILATION;  Surgeon: Irving Copas., MD;  Location: [place];  Service: Gastroenterology;;   BIOPSY  11/20/2020   Procedure: BIOPSY;  Surgeon: Irving Copas., MD;  Location: [facility];  Service: Gastroenterology;;   BIOPSY  08/21/2021   Procedure: BIOPSY;  Surgeon: Irving Copas., MD;  Location: [facility]COPY;   Service: Gastroenterology;;   CENTRAL LINE INSERTION Right 04/22/2020   Procedure: CENTRAL LINE INSERTION;  Surgeon: Virl Cagey, MD;  [facility];  Service: General;  Laterality: Right;   DIAGNOSTIC LAPAROSCOPY     ESOPHAGOGASTRODUODENOSCOPY N/A 11/20/2020   Procedure: ESOPHAGOGASTRODUODENOSCOPY (EGD);  Surgeon: Irving Copas., MD;  Location: [facility];  Service: Gastroenterology;  Laterality: N/A;   ESOPHAGOGASTRODUODENOSCOPY (EGD) WITH PROPOFOL N/A 07/10/2020   Procedure: ESOPHAGOGASTRODUODENOSCOPY (EGD) WITH PROPOFOL;  Surgeon: Rogene Houston, MD;  [facility];  Service: Endoscopy;  Laterality: N/A;   ESOPHAGOGASTRODUODENOSCOPY (EGD) WITH PROPOFOL N/A 10/18/2020   Procedure: ESOPHAGOGASTRODUODENOSCOPY (EGD) WITH PROPOFOL;  Surgeon: Harvel Quale, MD;  [facility];  Service: Gastroenterology;  Laterality: N/A;   ESOPHAGOGASTRODUODENOSCOPY (EGD) WITH PROPOFOL N/A 08/21/2021   Procedure: ESOPHAGOGASTRODUODENOSCOPY (EGD) WITH PROPOFOL;  Surgeon: Rush Landmark Telford Nab., MD;  Location: [place];  Service: Gastroenterology;  Laterality: N/A;   EUS N/A 11/20/2020   Procedure: UPPER ENDOSCOPIC ULTRASOUND (EUS) LINEAR;  Surgeon: Irving Copas., MD;  Location: [facility];  Service: Gastroenterology;  Laterality: N/A;   EUS  08/21/2021   Procedure: UPPER ENDOSCOPIC ULTRASOUND (EUS) LINEAR;  Surgeon: Irving Copas., MD;  Location: [facility] END[facility]COPY;  Service: Gastroenterology;;   FINE NEEDLE ASPIRATION  08/21/2021   Procedure: FINE NEEDLE ASPIRATION (FNA) LINEAR;  Surgeon: Justice Britain  Brooke Bonito., MD;  Location: [place];  Service: Gastroenterology;;   Toniann Ket  04/22/2020   Procedure: Toniann Ket;  Surgeon: Virl Cagey, MD;  [facility];  Service: General;;   IR CATHETER TUBE CHANGE  05/09/2020   LAPAROTOMY N/A 04/22/2020   Procedure: EXPLORATORY LAPAROTOMY;  Surgeon: Virl Cagey, MD;  [facility];  Service: General;  Laterality: N/A;   TONSILLECTOMY     Patient Active Problem List   Diagnosis Date Noted   RUQ pain 06/26/2022   Fecal urgency 06/26/2022   Irritable bowel syndrome with diarrhea 06/26/2022   Palpitations 10/29/2021   Pancreatic cyst 10/08/2021   Abdominal pain, chronic, epigastric 10/08/2021   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   Peptic ulcer disease 10/08/2021   Barrett's esophagus without dysplasia 10/08/2021   History of iron deficiency 10/08/2021   Nausea and vomiting 10/08/2021   Fever 10/08/2021   Anxiety and depression 09/17/2021   Encounter for gynecological examination with Papanicolaou smear of cervix 09/17/2021   Encounter for screening fecal occult blood testing 09/17/2021   Pain, abdominal, RLQ 09/17/2021   Postmenopause 09/17/2021   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   Personal history of other malignant neoplasm of skin 04/14/2021   Severe episode of recurrent major depressive disorder, without psychotic features ([place]) 02/18/2021   Genetic testing 01/14/2021   Family history of pancreatic cancer 01/06/2021   Family history of ovarian cancer 01/06/2021   Family history of uterine cancer 01/06/2021   IDA (iron deficiency anemia) 12/25/2020   Opioid contract exists 12/09/2020   Pancreatic lesion    Chronic abdominal pain    History of DVT (deep vein thrombosis) 11/15/2020   Anemia    History of pulmonary embolus (PE) 10/18/2020   Obesity (BMI 30-39.9) 10/18/2020   GERD (gastroesophageal reflux disease) 10/18/2020   Acquired complex cyst of kidney 10/09/2020   Basal cell carcinoma (BCC) of left side of nose 10/09/2020   Aortic atherosclerosis ([place]) 10/09/2020   Peripheral polyneuropathy 10/09/2020   Chronic diarrhea 09/24/2020   Cholelithiasis 09/05/2020   Primary osteoarthritis of both knees 07/24/2020   Duodenal ulcer    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   Essential hypertension 01/24/2015   Memory loss 01/24/2015   Neck pain  01/24/2015    PCP: Loura Pardon MD  REFERRING PROVIDER: Renette Butters, MD  REFERRING DIAG: M54.50 (ICD-10-CM) - Low back pain  Rationale for Evaluation and Treatment: Rehabilitation  THERAPY DIAG:  Other low back pain - Plan: PT plan of care cert/re-cert  Other abnormalities of gait and mobility - Plan: PT plan of care cert/re-cert  ONSET DATE: Chronic   SUBJECTIVE:  SUBJECTIVE STATEMENT: ***   PERTINENT HISTORY:  neuropathy  PAIN:  Are you having pain? Yes: NPRS scale: 7/10 Pain location: low back  Pain description: ache, sharp  Aggravating factors: bending, twisting Relieving factors: meds, laying down  PRECAUTIONS: None  WEIGHT BEARING RESTRICTIONS: No  FALLS:  Has patient fallen in last 6 months? No  LIVING ENVIRONMENT: Lives with: lives alone Lives in: House/apartment Stairs: No Has following equipment at home: None  OCCUPATION: Retired  PLOF: Independent  PATIENT GOALS: Less pain   NEXT MD VISIT:   OBJECTIVE:  09/15/2022 Treatment 1) ***  2)***  3)***  4)***  5)***     DIAGNOSTIC FINDINGS:  NA  PATIENT SURVEYS:  FOTO 45% function    COGNITION: Overall cognitive status: Within functional limits for tasks assessed     SENSATION: WFL   PALPATION: Mod TTP about bilateral ow lumbar paraspinals   LUMBAR ROM:   AROM eval  Flexion FUll  Extension 90% limited   Right lateral flexion 50% limited  Left lateral flexion 50% limited  Right rotation   Left rotation    (Blank rows = not tested)  LOWER EXTREMITY ROM:     Active  Right eval Left eval  Hip flexion    Hip extension    Hip abduction    Hip adduction    Hip internal rotation    Hip external rotation    Knee flexion -5 -5  Knee extension 57 90  Ankle dorsiflexion     Ankle plantarflexion    Ankle inversion    Ankle eversion     (Blank rows = not tested)  LOWER EXTREMITY MMT:    MMT Right eval Left eval  Hip flexion 4 4+  Hip extension 3+ 4  Hip abduction 3+ 4  Hip adduction    Hip internal rotation    Hip external rotation    Knee flexion 5 5  Knee extension 4+ 5  Ankle dorsiflexion 5 5  Ankle plantarflexion    Ankle inversion    Ankle eversion     (Blank rows = not tested)  FUNCTIONAL TESTS:  5 times sit to stand: 19.2 sec with LUE assist and RLE extended  2 minute walk test: 250eet with no AD   GAIT: Decreased stride, trendelenburg, decreased RT knee flexion   TODAY'S TREATMENT:                                                                                                                              DATE:  09/08/22 Eval    PATIENT EDUCATION:  Education details: on Eval findings, POC and HEP  Person educated: Patient Education method: Explanation Education comprehension: verbalized understanding  HOME EXERCISE PROGRAM: Access Code: [email or web address] URL: [email or web address] Date: 09/08/2022 Prepared by: Josue Hector  Exercises - Supine Heel Slide with Strap  - 2-3 x daily - 7 x weekly - 1-2 sets - 10 reps - 5 sec hold - Supine Lower Trunk  Rotation  - 2-3 x daily - 7 x weekly - 1 sets - 10 reps - 10 sec hold - Supine Transversus Abdominis Bracing - Hands on Stomach  - 2-3 x daily - 7 x weekly - 1-2 sets - 10 reps - 5 sec hold  ASSESSMENT:  CLINICAL IMPRESSION: ***  OBJECTIVE IMPAIRMENTS: Abnormal gait, decreased activity tolerance, decreased balance, decreased mobility, difficulty walking, decreased ROM, decreased strength, hypomobility, improper body mechanics, and pain.   ACTIVITY LIMITATIONS: carrying, lifting, bending, sitting, standing, squatting, stairs, transfers, and locomotion level  PARTICIPATION LIMITATIONS: meal prep, cleaning, laundry, driving, shopping, community activity, and yard  work  PERSONAL FACTORS: Past/current experiences and Time since onset of injury/illness/exacerbation are also affecting patient's functional outcome.   REHAB POTENTIAL: Good  CLINICAL DECISION MAKING: Stable/uncomplicated  EVALUATION COMPLEXITY: Low   GOALS: SHORT TERM GOALS: Target date: 09/29/2022  Patient will be independent with initial HEP and self-management strategies to improve functional outcomes Baseline:  Goal status: INITIAL   LONG TERM GOALS: Target date: 10/20/2022  Patient will be independent with advanced HEP and self-management strategies to improve functional outcomes Baseline:  Goal status: INITIAL  2.  Patient will improve FOTO score to predicted value to indicate improvement in functional outcomes Baseline: 45% Goal status: INITIAL  3.  Patient will have bilat knee AROM 5-90 degrees to improve functional mobility and facilitate squatting to pick up items from floor. Baseline: See AROM  Goal status: INITIAL  4. Patient will have equal to or > 4/5 MMT throughout BLE to improve ability to perform functional mobility, stair ambulation and ADLs.  Baseline: See MMT Goal status: INITIAL  5. Patient will be able to ambulate at least 325 feet during 2MWT with LRAD to demonstrate improved ability to perform functional mobility and associated tasks. Baseline: 250 feet with no AD  Goal status: INITIAL  PLAN:  PT FREQUENCY: 1-2x/week  PT DURATION: 6 weeks  PLANNED INTERVENTIONS: Therapeutic exercises, Therapeutic activity, Neuromuscular re-education, Balance training, Gait training, Patient/Family education, Joint manipulation, Joint mobilization, Stair training, Aquatic Therapy, Dry Needling, Electrical stimulation, Spinal manipulation, Spinal mobilization, Cryotherapy, Moist heat, scar mobilization, Taping, Traction, Ultrasound, Biofeedback, Ionotophoresis '4mg'$ /ml Dexamethasone, and Manual therapy. Marland Kitchen  PLAN FOR NEXT SESSION: *** Progress hip and core strength as  tolerated. Improve knee AROM for gait mechanics   10:26 AM, 09/08/22 Josue Hector PT DPT  Physical Therapist with [facility]  [phone]

## 2022-09-15 ENCOUNTER — Ambulatory Visit (HOSPITAL_COMMUNITY): Payer: Medicare HMO | Admitting: Physical Therapy

## 2022-09-15 DIAGNOSIS — M5459 Other low back pain: Secondary | ICD-10-CM

## 2022-09-15 DIAGNOSIS — R2689 Other abnormalities of gait and mobility: Secondary | ICD-10-CM

## 2022-09-15 NOTE — Therapy (Signed)
OUTPATIENT PHYSICAL THERAPY THORACOLUMBAR TREATMENT    Patient Name: Maria Murphy MRN: [id number] DOB:[birth year], 67 y.o., female Today's Date: 09/15/2022  END OF SESSION:  PT End of Session - 09/15/22 0908     Visit Number 2    Number of Visits 12    Date for PT Re-Evaluation 10/20/22    Authorization Type [employer]    Authorization Time Period 12 approved 1/16 to 10/20/22    Authorization - Visit Number 1    Authorization - Number of Visits 12    Progress Note Due on Visit 10    PT Start Time 0906    PT Stop Time 0945    PT Time Calculation (min) 39 min    Activity Tolerance Patient tolerated treatment well    Behavior During Therapy [facility] for tasks assessed/performed             Past Medical History:  Diagnosis Date   Acute saddle pulmonary embolism without acute cor pulmonale (HCC)    Basal cell carcinoma 01/16/2021   L nose, MOHs 04/14/21   Collagen vascular disease ([place])    Duodenal ulcer    Essential hypertension, benign 01/24/2015   Family history of ovarian cancer 01/06/2021   Family history of pancreatic cancer 01/06/2021   Family history of uterine cancer 01/06/2021   Foot ulcer (HCC)    GERD (gastroesophageal reflux disease)    Hypertension    Memory loss    Peptic ulcer with perforation (HCC)    Perforated abdominal viscus 04/22/2020   PUD (peptic ulcer disease)    with perforation s/p exploratory laparotomy   Pulmonary embolus ([place]) 04/2020   Skin cancer    Suicidal ideations 08/07/2020   Vision abnormalities    Past Surgical History:  Procedure Laterality Date   BILIARY DILATION  08/21/2021   Procedure: DUODENAL DILATION;  Surgeon: Irving Copas., MD;  Location: [place];  Service: Gastroenterology;;   BIOPSY  11/20/2020   Procedure: BIOPSY;  Surgeon: Irving Copas., MD;  Location: [facility];  Service: Gastroenterology;;   BIOPSY  08/21/2021   Procedure: BIOPSY;  Surgeon: Irving Copas., MD;  Location:  [facility];  Service: Gastroenterology;;   CENTRAL LINE INSERTION Right 04/22/2020   Procedure: CENTRAL LINE INSERTION;  Surgeon: Virl Cagey, MD;  Location: [facility];  Service: General;  Laterality: Right;   DIAGNOSTIC LAPAROSCOPY     ESOPHAGOGASTRODUODENOSCOPY N/A 11/20/2020   Procedure: ESOPHAGOGASTRODUODENOSCOPY (EGD);  Surgeon: Irving Copas., MD;  Location: [facility];  Service: Gastroenterology;  Laterality: N/A;   ESOPHAGOGASTRODUODENOSCOPY (EGD) WITH PROPOFOL N/A 07/10/2020   Procedure: ESOPHAGOGASTRODUODENOSCOPY (EGD) WITH PROPOFOL;  Surgeon: Rogene Houston, MD;  [facility];  Service: Endoscopy;  Laterality: N/A;   ESOPHAGOGASTRODUODENOSCOPY (EGD) WITH PROPOFOL N/A 10/18/2020   Procedure: ESOPHAGOGASTRODUODENOSCOPY (EGD) WITH PROPOFOL;  Surgeon: Harvel Quale, MD;  [facility];  Service: Gastroenterology;  Laterality: N/A;   ESOPHAGOGASTRODUODENOSCOPY (EGD) WITH PROPOFOL N/A 08/21/2021   Procedure: ESOPHAGOGASTRODUODENOSCOPY (EGD) WITH PROPOFOL;  Surgeon: Rush Landmark Telford Nab., MD;  Location: [place];  Service: Gastroenterology;  Laterality: N/A;   EUS N/A 11/20/2020   Procedure: UPPER ENDOSCOPIC ULTRASOUND (EUS) LINEAR;  Surgeon: Irving Copas., MD;  Location: [facility];  Service: Gastroenterology;  Laterality: N/A;   EUS  08/21/2021   Procedure: UPPER ENDOSCOPIC ULTRASOUND (EUS) LINEAR;  Surgeon: Irving Copas., MD;  Location: [place];  Service: Gastroenterology;;   FINE NEEDLE ASPIRATION  08/21/2021   Procedure: FINE NEEDLE ASPIRATION (FNA) LINEAR;  Surgeon: Irving Copas., MD;  Location: [facility];  Service: Gastroenterology;;   Toniann Ket  04/22/2020   Procedure: Toniann Ket;  Surgeon: Virl Cagey, MD;  Location: [facility];  Service: General;;   IR CATHETER TUBE CHANGE  05/09/2020   LAPAROTOMY N/A 04/22/2020   Procedure: EXPLORATORY LAPAROTOMY;  Surgeon: Virl Cagey, MD;   Location: [facility];  Service: General;  Laterality: N/A;   TONSILLECTOMY     Patient Active Problem List   Diagnosis Date Noted   RUQ pain 06/26/2022   Fecal urgency 06/26/2022   Irritable bowel syndrome with diarrhea 06/26/2022   Palpitations 10/29/2021   Pancreatic cyst 10/08/2021   Abdominal pain, chronic, epigastric 10/08/2021   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   Peptic ulcer disease 10/08/2021   Barrett's esophagus without dysplasia 10/08/2021   History of iron deficiency 10/08/2021   Nausea and vomiting 10/08/2021   Fever 10/08/2021   Anxiety and depression 09/17/2021   Encounter for gynecological examination with Papanicolaou smear of cervix 09/17/2021   Encounter for screening fecal occult blood testing 09/17/2021   Pain, abdominal, RLQ 09/17/2021   Postmenopause 09/17/2021   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   Personal history of other malignant neoplasm of skin 04/14/2021   Severe episode of recurrent major depressive disorder, without psychotic features ([place]) 02/18/2021   Genetic testing 01/14/2021   Family history of pancreatic cancer 01/06/2021   Family history of ovarian cancer 01/06/2021   Family history of uterine cancer 01/06/2021   IDA (iron deficiency anemia) 12/25/2020   Opioid contract exists 12/09/2020   Pancreatic lesion    Chronic abdominal pain    History of DVT (deep vein thrombosis) 11/15/2020   Anemia    History of pulmonary embolus ([place]) 10/18/2020   Obesity (BMI 30-39.9) 10/18/2020   GERD (gastroesophageal reflux disease) 10/18/2020   Acquired complex cyst of kidney 10/09/2020   Basal cell carcinoma (BCC) of left side of nose 10/09/2020   Aortic atherosclerosis ([place]) 10/09/2020   Peripheral polyneuropathy 10/09/2020   Chronic diarrhea 09/24/2020   Cholelithiasis 09/05/2020   Primary osteoarthritis of both knees 07/24/2020   Duodenal ulcer    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   Essential hypertension 01/24/2015   Memory loss 01/24/2015    Neck pain 01/24/2015    PCP: Loura Pardon MD  REFERRING PROVIDER: Renette Butters, MD  REFERRING DIAG: M54.50 (ICD-10-CM) - Low back pain  Rationale for Evaluation and Treatment: Rehabilitation  THERAPY DIAG:  Other low back pain  Other abnormalities of gait and mobility  ONSET DATE: Chronic   SUBJECTIVE:  SUBJECTIVE STATEMENT: "It (low back) always hurts"  Patient presents to therapy with complaint of LBP. She reports chronic history of arthritis but notes that back recently has flared up with pain. She was offered injections but would like to do therapy first. She denies taking medication for this issue.   PERTINENT HISTORY:  neuropathy  PAIN:  Are you having pain? Yes: NPRS scale: 7/10 Pain location: low back  Pain description: ache, sharp  Aggravating factors: bending, twisting Relieving factors: meds, laying down  PRECAUTIONS: None  WEIGHT BEARING RESTRICTIONS: No  FALLS:  Has patient fallen in last 6 months? No  LIVING ENVIRONMENT: Lives with: lives alone Lives in: House/apartment Stairs: No Has following equipment at home: None  OCCUPATION: Retired  PLOF: Independent  PATIENT GOALS: Less pain   NEXT MD VISIT:   OBJECTIVE:   DIAGNOSTIC FINDINGS:  NA  PATIENT SURVEYS:  FOTO 45% function    COGNITION: Overall cognitive status: Within functional limits for tasks assessed     SENSATION: WFL   PALPATION: Mod TTP about bilateral ow lumbar paraspinals   LUMBAR ROM:   AROM eval  Flexion FUll  Extension 90% limited   Right lateral flexion 50% limited  Left lateral flexion 50% limited  Right rotation   Left rotation    (Blank rows = not tested)  LOWER EXTREMITY ROM:     Active  Right eval Left eval  Hip flexion    Hip extension    Hip  abduction    Hip adduction    Hip internal rotation    Hip external rotation    Knee flexion -5 -5  Knee extension 57 90  Ankle dorsiflexion    Ankle plantarflexion    Ankle inversion    Ankle eversion     (Blank rows = not tested)  LOWER EXTREMITY MMT:    MMT Right eval Left eval  Hip flexion 4 4+  Hip extension 3+ 4  Hip abduction 3+ 4  Hip adduction    Hip internal rotation    Hip external rotation    Knee flexion 5 5  Knee extension 4+ 5  Ankle dorsiflexion 5 5  Ankle plantarflexion    Ankle inversion    Ankle eversion     (Blank rows = not tested)  FUNCTIONAL TESTS:  5 times sit to stand: 19.2 sec with LUE assist and RLE extended  2 minute walk test: 250eet with no AD   GAIT: Decreased stride, trendelenburg, decreased RT knee flexion   TODAY'S TREATMENT:                                                                                                                              DATE:  09/15/22 Heel slides 10 x 5" each LTR 10 x 5" Ab brace 10 x 5" SLR with ab brace 2 x 10 Bridge 2 x 10 Sidelying hip abduction 2 x 10 each  Standing:  heel raise 2 x  10 Step up 4 inch 2 x 10 each  Side step 4 inch x 10 each  Sit to stand 2 x 10  09/08/22 Eval    PATIENT EDUCATION:  Education details: on Eval findings, POC and HEP  Person educated: Patient Education method: Explanation Education comprehension: verbalized understanding  HOME EXERCISE PROGRAM: Access Code: [email or web address] URL: [email or web address]  09/15/22 - Sidelying Hip Abduction  - 2 x daily - 7 x weekly - 2 sets - 10 reps - Supine Bridge  - 2 x daily - 7 x weekly - 2 sets - 10 reps - Sit to Stand Without Arm Support  - 2 x daily - 7 x weekly - 1-2 sets - 10 reps  Date: 09/08/2022 Prepared by: Josue Hector  Exercises - Supine Heel Slide with Strap  - 2-3 x daily - 7 x weekly - 1-2 sets - 10 reps - 5 sec hold - Supine Lower Trunk Rotation  - 2-3 x daily - 7 x weekly - 1 sets - 10  reps - 10 sec hold - Supine Transversus Abdominis Bracing - Hands on Stomach  - 2-3 x daily - 7 x weekly - 1-2 sets - 10 reps - 5 sec hold  ASSESSMENT:  CLINICAL IMPRESSION: Initiated ther ex. Patient tolerated session well today. Progressed LE and core strength. Patient continues to demo significant restriction in RT knee flexion. Patient cued for improved knee bending and even weight distribution during sit to stands. Patient requires HHA for stability during step ups. Patient will continue to benefit from skilled therapy services to reduce remaining deficits and improve functional ability.    OBJECTIVE IMPAIRMENTS: Abnormal gait, decreased activity tolerance, decreased balance, decreased mobility, difficulty walking, decreased ROM, decreased strength, hypomobility, improper body mechanics, and pain.   ACTIVITY LIMITATIONS: carrying, lifting, bending, sitting, standing, squatting, stairs, transfers, and locomotion level  PARTICIPATION LIMITATIONS: meal prep, cleaning, laundry, driving, shopping, community activity, and yard work  PERSONAL FACTORS: Past/current experiences and Time since onset of injury/illness/exacerbation are also affecting patient's functional outcome.   REHAB POTENTIAL: Good  CLINICAL DECISION MAKING: Stable/uncomplicated  EVALUATION COMPLEXITY: Low   GOALS: SHORT TERM GOALS: Target date: 09/29/2022  Patient will be independent with initial HEP and self-management strategies to improve functional outcomes Baseline:  Goal status: INITIAL   LONG TERM GOALS: Target date: 10/20/2022  Patient will be independent with advanced HEP and self-management strategies to improve functional outcomes Baseline:  Goal status: INITIAL  2.  Patient will improve FOTO score to predicted value to indicate improvement in functional outcomes Baseline: 45% Goal status: INITIAL  3.  Patient will have bilat knee AROM 5-90 degrees to improve functional mobility and facilitate  squatting to pick up items from floor. Baseline: See AROM  Goal status: INITIAL  4. Patient will have equal to or > 4/5 MMT throughout BLE to improve ability to perform functional mobility, stair ambulation and ADLs.  Baseline: See MMT Goal status: INITIAL  5. Patient will be able to ambulate at least 325 feet during 2MWT with LRAD to demonstrate improved ability to perform functional mobility and associated tasks. Baseline: 250 feet with no AD  Goal status: INITIAL  PLAN:  PT FREQUENCY: 1-2x/week  PT DURATION: 6 weeks  PLANNED INTERVENTIONS: Therapeutic exercises, Therapeutic activity, Neuromuscular re-education, Balance training, Gait training, Patient/Family education, Joint manipulation, Joint mobilization, Stair training, Aquatic Therapy, Dry Needling, Electrical stimulation, Spinal manipulation, Spinal mobilization, Cryotherapy, Moist heat, scar mobilization, Taping, Traction, Ultrasound, Biofeedback, Ionotophoresis '4mg'$ /ml Dexamethasone, and Manual therapy. Marland Kitchen  PLAN FOR NEXT SESSION: Progress hip and core strength as tolerated. Improve knee AROM for gait mechanics   9:45 AM, 09/15/22 Josue Hector PT DPT  Physical Therapist with [facility]  [phone]

## 2022-09-22 ENCOUNTER — Ambulatory Visit (HOSPITAL_COMMUNITY): Payer: Medicare HMO

## 2022-09-22 DIAGNOSIS — M5459 Other low back pain: Secondary | ICD-10-CM

## 2022-09-22 DIAGNOSIS — R2689 Other abnormalities of gait and mobility: Secondary | ICD-10-CM

## 2022-09-22 NOTE — Therapy (Signed)
OUTPATIENT PHYSICAL THERAPY THORACOLUMBAR TREATMENT    Patient Name: Maria Murphy MRN: [id number] DOB:06-15-1956, 67 y.o., female Today's Date: 09/22/2022  END OF SESSION:  PT End of Session - 09/22/22 0738     Visit Number 3    Number of Visits 12    Date for PT Re-Evaluation 10/20/22    Authorization Type [employer]    Authorization Time Period 12 approved 1/16 to 10/20/22    Authorization - Visit Number 2    Authorization - Number of Visits 12    Progress Note Due on Visit 10    PT Start Time 0735    PT Stop Time 0815    PT Time Calculation (min) 40 min    Activity Tolerance Patient tolerated treatment well    Behavior During Therapy [facility] for tasks assessed/performed             Past Medical History:  Diagnosis Date   Acute saddle pulmonary embolism without acute cor pulmonale (HCC)    Basal cell carcinoma 01/16/2021   L nose, MOHs 04/14/21   Collagen vascular disease (HCC)    Duodenal ulcer    Essential hypertension, benign 01/24/2015   Family history of ovarian cancer 01/06/2021   Family history of pancreatic cancer 01/06/2021   Family history of uterine cancer 01/06/2021   Foot ulcer (HCC)    GERD (gastroesophageal reflux disease)    Hypertension    Memory loss    Peptic ulcer with perforation (HCC)    Perforated abdominal viscus 04/22/2020   PUD (peptic ulcer disease)    with perforation s/p exploratory laparotomy   Pulmonary embolus ([place]) 04/2020   Skin cancer    Suicidal ideations 08/07/2020   Vision abnormalities    Past Surgical History:  Procedure Laterality Date   BILIARY DILATION  08/21/2021   Procedure: DUODENAL DILATION;  Surgeon: Irving Copas., MD;  Location: [place];  Service: Gastroenterology;;   BIOPSY  11/20/2020   Procedure: BIOPSY;  Surgeon: Irving Copas., MD;  Location: [facility];  Service: Gastroenterology;;   BIOPSY  08/21/2021   Procedure: BIOPSY;  Surgeon: Irving Copas., MD;  Location[facility];  Service: Gastroenterology;;   CENTRAL LINE INSERTION Right 04/22/2020   Procedure: CENTRAL LINE INSERTION;  Surgeon: Virl Cagey, MD;  Location: [facility];  Service: General;  Laterality: Right;   DIAGNOSTIC LAPAROSCOPY     ESOPHAGOGASTRODUODENOSCOPY N/A 11/20/2020   Procedure: ESOPHAGOGASTRODUODENOSCOPY (EGD);  Surgeon: Irving Copas., MD;  Location: [facility];  Service: Gastroenterology;  Laterality: N/A;   ESOPHAGOGASTRODUODENOSCOPY (EGD) WITH PROPOFOL N/A 07/10/2020   Procedure: ESOPHAGOGASTRODUODENOSCOPY (EGD) WITH PROPOFOL;  Surgeon: Rogene Houston, MD;  Location: [facility];  Service: Endoscopy;  Laterality: N/A;   ESOPHAGOGASTRODUODENOSCOPY (EGD) WITH PROPOFOL N/A 10/18/2020   Procedure: ESOPHAGOGASTRODUODENOSCOPY (EGD) WITH PROPOFOL;  Surgeon: Harvel Quale, MD;  Location: [facility];  Service: Gastroenterology;  Laterality: N/A;   ESOPHAGOGASTRODUODENOSCOPY (EGD) WITH PROPOFOL N/A 08/21/2021   Procedure: ESOPHAGOGASTRODUODENOSCOPY (EGD) WITH PROPOFOL;  Surgeon: Rush Landmark Telford Nab., MD;  Location: [place];  Service: Gastroenterology;  Laterality: N/A;   EUS N/A 11/20/2020   Procedure: UPPER ENDOSCOPIC ULTRASOUND (EUS) LINEAR;  Surgeon: Irving Copas., MD;  Location: [facility];  Service: Gastroenterology;  Laterality: N/A;   EUS  08/21/2021   Procedure: UPPER ENDOSCOPIC ULTRASOUND (EUS) LINEAR;  Surgeon: Irving Copas., MD;  Location: [place];  Service: Gastroenterology;;   FINE NEEDLE ASPIRATION  08/21/2021   Procedure: FINE NEEDLE ASPIRATION (FNA) LINEAR;  Surgeon: Irving Copas., MD;  Location: [facility];  Service: Gastroenterology;;   Toniann Ket  04/22/2020   Procedure: Toniann Ket;  Surgeon: Virl Cagey, MD;  Location: [facility];  Service: General;;   IR CATHETER TUBE CHANGE  05/09/2020   LAPAROTOMY N/A 04/22/2020   Procedure: EXPLORATORY LAPAROTOMY;  Surgeon: Virl Cagey, MD;   Location: [facility];  Service: General;  Laterality: N/A;   TONSILLECTOMY     Patient Active Problem List   Diagnosis Date Noted   RUQ pain 06/26/2022   Fecal urgency 06/26/2022   Irritable bowel syndrome with diarrhea 06/26/2022   Palpitations 10/29/2021   Pancreatic cyst 10/08/2021   Abdominal pain, chronic, epigastric 10/08/2021   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   Peptic ulcer disease 10/08/2021   Barrett's esophagus without dysplasia 10/08/2021   History of iron deficiency 10/08/2021   Nausea and vomiting 10/08/2021   Fever 10/08/2021   Anxiety and depression 09/17/2021   Encounter for gynecological examination with Papanicolaou smear of cervix 09/17/2021   Encounter for screening fecal occult blood testing 09/17/2021   Pain, abdominal, RLQ 09/17/2021   Postmenopause 09/17/2021   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   Personal history of other malignant neoplasm of skin 04/14/2021   Severe episode of recurrent major depressive disorder, without psychotic features ([place]) 02/18/2021   Genetic testing 01/14/2021   Family history of pancreatic cancer 01/06/2021   Family history of ovarian cancer 01/06/2021   Family history of uterine cancer 01/06/2021   IDA (iron deficiency anemia) 12/25/2020   Opioid contract exists 12/09/2020   Pancreatic lesion    Chronic abdominal pain    History of DVT (deep vein thrombosis) 11/15/2020   Anemia    History of pulmonary embolus (PE) 10/18/2020   Obesity (BMI 30-39.9) 10/18/2020   GERD (gastroesophageal reflux disease) 10/18/2020   Acquired complex cyst of kidney 10/09/2020   Basal cell carcinoma (BCC) of left side of nose 10/09/2020   Aortic atherosclerosis ([place]) 10/09/2020   Peripheral polyneuropathy 10/09/2020   Chronic diarrhea 09/24/2020   Cholelithiasis 09/05/2020   Primary osteoarthritis of both knees 07/24/2020   Duodenal ulcer    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   Essential hypertension 01/24/2015   Memory loss 01/24/2015    Neck pain 01/24/2015    PCP: Loura Pardon MD  REFERRING PROVIDER: Renette Butters, MD  REFERRING DIAG: M54.50 (ICD-10-CM) - Low back pain  Rationale for Evaluation and Treatment: Rehabilitation  THERAPY DIAG:  Other low back pain  Other abnormalities of gait and mobility  ONSET DATE: Chronic   SUBJECTIVE:  SUBJECTIVE STATEMENT: Continued back pain; reports continuous pain but tends to be worse in the evenings.  7/10 pain today.  Goes to the pool in [place] 3 times a week; "I feel good in the pool"  Patient presents to therapy with complaint of LBP. She reports chronic history of arthritis but notes that back recently has flared up with pain. She was offered injections but would like to do therapy first. She denies taking medication for this issue.   PERTINENT HISTORY:  neuropathy  PAIN:  Are you having pain? Yes: NPRS scale: 7/10 Pain location: low back  Pain description: ache, sharp  Aggravating factors: bending, twisting Relieving factors: meds, laying down  PRECAUTIONS: None  WEIGHT BEARING RESTRICTIONS: No  FALLS:  Has patient fallen in last 6 months? No  LIVING ENVIRONMENT: Lives with: lives alone Lives in: House/apartment Stairs: No Has following equipment at home: None  OCCUPATION: Retired  PLOF: Independent  PATIENT GOALS: Less pain   NEXT MD VISIT:   OBJECTIVE:   DIAGNOSTIC FINDINGS:  NA  PATIENT SURVEYS:  FOTO 45% function    COGNITION: Overall cognitive status: Within functional limits for tasks assessed     SENSATION: WFL   PALPATION: Mod TTP about bilateral ow lumbar paraspinals   LUMBAR ROM:   AROM eval  Flexion FUll  Extension 90% limited   Right lateral flexion 50% limited  Left lateral flexion 50% limited  Right rotation   Left  rotation    (Blank rows = not tested)  LOWER EXTREMITY ROM:     Active  Right eval Left eval  Hip flexion    Hip extension    Hip abduction    Hip adduction    Hip internal rotation    Hip external rotation    Knee flexion -5 -5  Knee extension 57 90  Ankle dorsiflexion    Ankle plantarflexion    Ankle inversion    Ankle eversion     (Blank rows = not tested)  LOWER EXTREMITY MMT:    MMT Right eval Left eval  Hip flexion 4 4+  Hip extension 3+ 4  Hip abduction 3+ 4  Hip adduction    Hip internal rotation    Hip external rotation    Knee flexion 5 5  Knee extension 4+ 5  Ankle dorsiflexion 5 5  Ankle plantarflexion    Ankle inversion    Ankle eversion     (Blank rows = not tested)  FUNCTIONAL TESTS:  5 times sit to stand: 19.2 sec with LUE assist and RLE extended  2 minute walk test: 250eet with no AD   GAIT: Decreased stride, trendelenburg, decreased RT knee flexion   TODAY'S TREATMENT:                                                                                                                              DATE:  09/22/22 Supine: Heel slides x 10 each LTR 10 x 5"  Ab set 5" hold x 10 Ab set with SLR 2 x 10 Bridge 2 x 10 Sidelying hip abduction 2 x 10  Standing: Knee drives for flexion on steps x 1' each Heel/toe raises 2 x 10 Mini squat 2 x 10 RTB scapular retraction 2 x 10 RTB shoulder extension 2 x 10 Slant board 5 x 20" 4" step ups 2 x 10  Sit to stand 2 x 5      09/15/22 Heel slides 10 x 5" each LTR 10 x 5" Ab brace 10 x 5" SLR with ab brace 2 x 10 Bridge 2 x 10 Sidelying hip abduction 2 x 10 each  Standing:  heel raise 2 x 10 Step up 4 inch 2 x 10 each  Side step 4 inch x 10 each  Sit to stand 2 x 10  09/08/22 Eval    PATIENT EDUCATION:  Education details: on Eval findings, POC and HEP  Person educated: Patient Education method: Explanation Education comprehension: verbalized understanding  HOME EXERCISE  PROGRAM: Access Code: [email or web address] URL: [email or web address]  09/15/22 - Sidelying Hip Abduction  - 2 x daily - 7 x weekly - 2 sets - 10 reps - Supine Bridge  - 2 x daily - 7 x weekly - 2 sets - 10 reps - Sit to Stand Without Arm Support  - 2 x daily - 7 x weekly - 1-2 sets - 10 reps  Date: 09/08/2022 Prepared by: Josue Hector  Exercises - Supine Heel Slide with Strap  - 2-3 x daily - 7 x weekly - 1-2 sets - 10 reps - 5 sec hold - Supine Lower Trunk Rotation  - 2-3 x daily - 7 x weekly - 1 sets - 10 reps - 10 sec hold - Supine Transversus Abdominis Bracing - Hands on Stomach  - 2-3 x daily - 7 x weekly - 1-2 sets - 10 reps - 5 sec hold  ASSESSMENT:  CLINICAL IMPRESSION: Printed off patient's HEP as printer was not working last session.  Today's session continued to focus on core and lower extremity strengthening.  Patient continues with significant stiffness both knees with flexion right > left.  Sit to stands with noted weight shifted over to the left side.  Squat depth limited by limited knee flexion.  Needs cues to avoid circumduction of the right leg with right side step ups.  Patient will continue to benefit from skilled therapy services to reduce remaining deficits and improve functional ability.    OBJECTIVE IMPAIRMENTS: Abnormal gait, decreased activity tolerance, decreased balance, decreased mobility, difficulty walking, decreased ROM, decreased strength, hypomobility, improper body mechanics, and pain.   ACTIVITY LIMITATIONS: carrying, lifting, bending, sitting, standing, squatting, stairs, transfers, and locomotion level  PARTICIPATION LIMITATIONS: meal prep, cleaning, laundry, driving, shopping, community activity, and yard work  PERSONAL FACTORS: Past/current experiences and Time since onset of injury/illness/exacerbation are also affecting patient's functional outcome.   REHAB POTENTIAL: Good  CLINICAL DECISION MAKING: Stable/uncomplicated  EVALUATION  COMPLEXITY: Low   GOALS: SHORT TERM GOALS: Target date: 09/29/2022  Patient will be independent with initial HEP and self-management strategies to improve functional outcomes Baseline:  Goal status: IN PROGRESS   LONG TERM GOALS: Target date: 10/20/2022  Patient will be independent with advanced HEP and self-management strategies to improve functional outcomes Baseline:  Goal status: IN PROGRESS  2.  Patient will improve FOTO score to predicted value to indicate improvement in functional outcomes Baseline: 45% Goal status: IN PROGRESS  3.  Patient will have bilat  knee AROM 5-90 degrees to improve functional mobility and facilitate squatting to pick up items from floor. Baseline: See AROM  Goal status: IN PROGRESS  4. Patient will have equal to or > 4/5 MMT throughout BLE to improve ability to perform functional mobility, stair ambulation and ADLs.  Baseline: See MMT Goal status: IN PROGRESS  5. Patient will be able to ambulate at least 325 feet during 2MWT with LRAD to demonstrate improved ability to perform functional mobility and associated tasks. Baseline: 250 feet with no AD  Goal status: IN PROGRESS  PLAN:  PT FREQUENCY: 1-2x/week  PT DURATION: 6 weeks  PLANNED INTERVENTIONS: Therapeutic exercises, Therapeutic activity, Neuromuscular re-education, Balance training, Gait training, Patient/Family education, Joint manipulation, Joint mobilization, Stair training, Aquatic Therapy, Dry Needling, Electrical stimulation, Spinal manipulation, Spinal mobilization, Cryotherapy, Moist heat, scar mobilization, Taping, Traction, Ultrasound, Biofeedback, Ionotophoresis '4mg'$ /ml Dexamethasone, and Manual therapy. Marland Kitchen  PLAN FOR NEXT SESSION: Progress hip and core strength as tolerated. Improve knee AROM for gait mechanics   8:15 AM, 09/22/22 [place] Herbig MPT [facility] [place] [phone]

## 2022-09-25 ENCOUNTER — Encounter (HOSPITAL_COMMUNITY): Payer: Medicare HMO

## 2022-09-29 ENCOUNTER — Encounter (HOSPITAL_COMMUNITY): Payer: Medicare HMO | Admitting: Physical Therapy

## 2022-10-02 ENCOUNTER — Encounter (HOSPITAL_COMMUNITY): Payer: Medicare HMO

## 2022-10-02 ENCOUNTER — Other Ambulatory Visit: Payer: Self-pay | Admitting: Nurse Practitioner

## 2022-10-02 DIAGNOSIS — F0390 Unspecified dementia without behavioral disturbance: Secondary | ICD-10-CM

## 2022-10-06 ENCOUNTER — Encounter (HOSPITAL_COMMUNITY): Payer: Medicare HMO | Admitting: Physical Therapy

## 2022-10-09 ENCOUNTER — Encounter (HOSPITAL_COMMUNITY): Payer: Medicare HMO

## 2022-10-09 ENCOUNTER — Encounter: Payer: Self-pay | Admitting: Nurse Practitioner

## 2022-10-10 ENCOUNTER — Ambulatory Visit
Admission: RE | Admit: 2022-10-10 | Discharge: 2022-10-10 | Disposition: A | Discharge status: Home / Self Care | Payer: Medicare HMO | Source: Ambulatory Visit | Attending: Nurse Practitioner | Admitting: Nurse Practitioner

## 2022-10-10 DIAGNOSIS — F0390 Unspecified dementia without behavioral disturbance: Secondary | ICD-10-CM

## 2022-10-10 MED ORDER — GADOPICLENOL 0.5 MMOL/ML IV SOLN
7.5000 mL | Freq: Once | INTRAVENOUS | Status: AC | PRN
  Administered 2022-10-10: 7.5 mL via INTRAVENOUS

## 2022-10-13 ENCOUNTER — Encounter (HOSPITAL_COMMUNITY): Payer: Medicare HMO | Admitting: Physical Therapy

## 2022-10-16 ENCOUNTER — Encounter (HOSPITAL_COMMUNITY): Payer: Medicare HMO

## 2022-10-20 ENCOUNTER — Encounter (HOSPITAL_COMMUNITY): Payer: Medicare HMO | Admitting: Physical Therapy

## 2022-10-22 ENCOUNTER — Encounter: Payer: Self-pay | Admitting: Radiology

## 2022-10-23 ENCOUNTER — Encounter (HOSPITAL_COMMUNITY): Payer: Medicare HMO

## 2022-11-11 ENCOUNTER — Emergency Department (HOSPITAL_COMMUNITY): Payer: Medicare HMO

## 2022-11-11 ENCOUNTER — Emergency Department (HOSPITAL_COMMUNITY)
Admission: EM | Admit: 2022-11-11 | Discharge: 2022-11-11 | Disposition: A | Discharge status: Home / Self Care | Payer: Medicare HMO | Attending: Emergency Medicine | Admitting: Emergency Medicine

## 2022-11-11 DIAGNOSIS — I10 Essential (primary) hypertension: Secondary | ICD-10-CM | POA: Diagnosis not present

## 2022-11-11 DIAGNOSIS — D259 Leiomyoma of uterus, unspecified: Secondary | ICD-10-CM | POA: Diagnosis not present

## 2022-11-11 DIAGNOSIS — K862 Cyst of pancreas: Secondary | ICD-10-CM | POA: Diagnosis not present

## 2022-11-11 DIAGNOSIS — Y92410 Unspecified street and highway as the place of occurrence of the external cause: Secondary | ICD-10-CM | POA: Diagnosis not present

## 2022-11-11 DIAGNOSIS — I672 Cerebral atherosclerosis: Secondary | ICD-10-CM | POA: Insufficient documentation

## 2022-11-11 DIAGNOSIS — M542 Cervicalgia: Secondary | ICD-10-CM | POA: Diagnosis not present

## 2022-11-11 DIAGNOSIS — K769 Liver disease, unspecified: Secondary | ICD-10-CM | POA: Insufficient documentation

## 2022-11-11 DIAGNOSIS — N281 Cyst of kidney, acquired: Secondary | ICD-10-CM | POA: Diagnosis not present

## 2022-11-11 DIAGNOSIS — M545 Low back pain, unspecified: Secondary | ICD-10-CM | POA: Diagnosis not present

## 2022-11-11 LAB — COMPREHENSIVE METABOLIC PANEL
ALT: 9 U/L (ref 0–44)
AST: 21 U/L (ref 15–41)
Albumin: 3.8 g/dL (ref 3.5–5.0)
Alkaline Phosphatase: 53 U/L (ref 38–126)
Anion gap: 13 (ref 5–15)
BUN: 9 mg/dL (ref 8–23)
CO2: 17 mmol/L — ABNORMAL LOW (ref 22–32)
Calcium: 8.8 mg/dL — ABNORMAL LOW (ref 8.9–10.3)
Chloride: 108 mmol/L (ref 98–111)
Creatinine, Ser: 0.84 mg/dL (ref 0.44–1.00)
GFR, Estimated: >60 mL/min (ref >60)
Glucose, Bld: 104 mg/dL — ABNORMAL HIGH (ref 70–99)
Potassium: 3.1 mmol/L — ABNORMAL LOW (ref 3.5–5.1)
Sodium: 138 mmol/L (ref 135–145)
Total Bilirubin: 0.6 mg/dL (ref 0.3–1.2)
Total Protein: 6.4 g/dL — ABNORMAL LOW (ref 6.5–8.1)

## 2022-11-11 LAB — RAPID URINE DRUG SCREEN, HOSP PERFORMED
Amphetamines: NOT DETECTED
Barbiturates: NOT DETECTED
Benzodiazepines: NOT DETECTED
Cocaine: NOT DETECTED
Opiates: NOT DETECTED
Tetrahydrocannabinol: POSITIVE — AB

## 2022-11-11 LAB — URINALYSIS, ROUTINE W REFLEX MICROSCOPIC
Bilirubin Urine: NEGATIVE
Glucose, UA: NEGATIVE mg/dL
Hgb urine dipstick: NEGATIVE
Ketones, ur: NEGATIVE mg/dL
Nitrite: NEGATIVE
Protein, ur: NEGATIVE mg/dL
Specific Gravity, Urine: 1.024 (ref 1.005–1.030)
pH: 6 (ref 5.0–8.0)

## 2022-11-11 LAB — CBC WITH DIFFERENTIAL/PLATELET
Abs Immature Granulocytes: 0.01 10*3/uL (ref 0.00–0.07)
Basophils Absolute: 0 10*3/uL (ref 0.0–0.1)
Basophils Relative: 0 %
Eosinophils Absolute: 0.1 10*3/uL (ref 0.0–0.5)
Eosinophils Relative: 1 %
HCT: 39 % (ref 36.0–46.0)
Hemoglobin: 13.1 g/dL (ref 12.0–15.0)
Immature Granulocytes: 0 %
Lymphocytes Relative: 24 %
Lymphs Abs: 1.2 10*3/uL (ref 0.7–4.0)
MCH: 30.7 pg (ref 26.0–34.0)
MCHC: 33.6 g/dL (ref 30.0–36.0)
MCV: 91.3 fL (ref 80.0–100.0)
Monocytes Absolute: 0.3 10*3/uL (ref 0.1–1.0)
Monocytes Relative: 6 %
Neutro Abs: 3.3 10*3/uL (ref 1.7–7.7)
Neutrophils Relative %: 69 %
Platelets: 155 10*3/uL (ref 150–400)
RBC: 4.27 MIL/uL (ref 3.87–5.11)
RDW: 14.4 % (ref 11.5–15.5)
WBC: 4.9 10*3/uL (ref 4.0–10.5)
nRBC: 0 % (ref 0.0–0.2)

## 2022-11-11 LAB — I-STAT CHEM 8, ED
BUN: 9 mg/dL (ref 8–23)
Calcium, Ion: 1.18 mmol/L (ref 1.15–1.40)
Chloride: 106 mmol/L (ref 98–111)
Creatinine, Ser: 0.8 mg/dL (ref 0.44–1.00)
Glucose, Bld: 107 mg/dL — ABNORMAL HIGH (ref 70–99)
HCT: 38 % (ref 36.0–46.0)
Hemoglobin: 12.9 g/dL (ref 12.0–15.0)
Potassium: 3.2 mmol/L — ABNORMAL LOW (ref 3.5–5.1)
Sodium: 141 mmol/L (ref 135–145)
TCO2: 24 mmol/L (ref 22–32)

## 2022-11-11 LAB — SAMPLE TO BLOOD BANK

## 2022-11-11 LAB — PROTIME-INR
INR: 1.1 (ref 0.8–1.2)
Prothrombin Time: 13.7 seconds (ref 11.4–15.2)

## 2022-11-11 LAB — ETHANOL: Alcohol, Ethyl (B): <10 mg/dL (ref <10)

## 2022-11-11 MED ORDER — IOHEXOL 350 MG/ML SOLN
75.0000 mL | Freq: Once | INTRAVENOUS | Status: AC | PRN
  Administered 2022-11-11: 75 mL via INTRAVENOUS

## 2022-11-11 MED ORDER — FENTANYL CITRATE PF 50 MCG/ML IJ SOSY
50.0000 ug | PREFILLED_SYRINGE | Freq: Once | INTRAMUSCULAR | Status: AC
  Administered 2022-11-11: 50 ug via INTRAVENOUS
  Filled 2022-11-11: qty 1

## 2022-11-11 NOTE — ED Notes (Signed)
Patient transported to CT 
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
## 2022-11-11 NOTE — ED Notes (Signed)
Patient alert and oriented speaking on phone.
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
## 2022-11-11 NOTE — Progress Notes (Signed)
Orthopedic Tech Progress Note Patient Details:  [name] Mar 24, 1956 [id number]  Patient ID: Maria Murphy, female   DOB: Mar 31, 1956, 67 y.o.   MRN: [id number] Level II; not currently needed. Vernona Rieger 11/11/2022, 9:19 AM
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
## 2022-11-26 ENCOUNTER — Encounter (HOSPITAL_COMMUNITY): Payer: Self-pay | Admitting: Hematology

## 2022-12-03 ENCOUNTER — Ambulatory Visit (INDEPENDENT_AMBULATORY_CARE_PROVIDER_SITE_OTHER): Payer: Medicare HMO | Admitting: Podiatry

## 2022-12-03 ENCOUNTER — Ambulatory Visit (INDEPENDENT_AMBULATORY_CARE_PROVIDER_SITE_OTHER): Payer: Medicare HMO

## 2022-12-03 ENCOUNTER — Encounter: Payer: Self-pay | Admitting: Podiatry

## 2022-12-03 DIAGNOSIS — M21612 Bunion of left foot: Secondary | ICD-10-CM

## 2022-12-03 DIAGNOSIS — M7751 Other enthesopathy of right foot: Secondary | ICD-10-CM | POA: Diagnosis not present

## 2022-12-03 DIAGNOSIS — M21611 Bunion of right foot: Secondary | ICD-10-CM

## 2022-12-03 DIAGNOSIS — M7752 Other enthesopathy of left foot: Secondary | ICD-10-CM | POA: Diagnosis not present

## 2022-12-03 MED ORDER — TRIAMCINOLONE ACETONIDE 10 MG/ML IJ SUSP
20.0000 mg | Freq: Once | INTRAMUSCULAR | Status: AC
  Administered 2022-12-03: 20 mg

## 2022-12-03 NOTE — Progress Notes (Signed)
Subjective:   Patient ID: Maria Murphy, female   DOB: 67 y.o.   MRN: [id number]   HPI Patient presents with large bunion deformities of both feet stating that recently they have started to become more tender and making it hard to wear shoes.  The area we worked on previously is doing well   ROS      Objective:  Physical Exam  Neurovascular status intact with large structural deformity first metatarsal head bilateral with inflammation fluid around the metatarsal head itself and pain when pressed.     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  At this point I did do sterile prep injected the capsule bilateral 3 mg Dexasone Kenalog 5 mg Xylocaine to shrink the inflammation and then advised on wider shoes reappoint as symptoms indicate may require surgery  X-rays indicate large elevation of intermetatarsal angle left over right with moderate osteoporosis noted

## 2022-12-09 ENCOUNTER — Ambulatory Visit: Payer: Medicare HMO | Admitting: Physician Assistant

## 2022-12-21 ENCOUNTER — Other Ambulatory Visit: Payer: Self-pay | Admitting: Podiatry

## 2022-12-21 DIAGNOSIS — M7752 Other enthesopathy of left foot: Secondary | ICD-10-CM

## 2022-12-21 DIAGNOSIS — M21611 Bunion of right foot: Secondary | ICD-10-CM

## 2022-12-21 DIAGNOSIS — M7751 Other enthesopathy of right foot: Secondary | ICD-10-CM

## 2023-01-11 ENCOUNTER — Ambulatory Visit: Payer: Medicare HMO | Attending: Cardiology | Admitting: Cardiology

## 2023-01-11 ENCOUNTER — Encounter: Payer: Self-pay | Admitting: Cardiology

## 2023-01-11 VITALS — BP 130/76 | HR 93 | Ht 64.0 in | Wt 147.0 lb

## 2023-01-11 DIAGNOSIS — I700 Atherosclerosis of aorta: Secondary | ICD-10-CM

## 2023-01-11 DIAGNOSIS — I2510 Atherosclerotic heart disease of native coronary artery without angina pectoris: Secondary | ICD-10-CM | POA: Diagnosis not present

## 2023-01-11 DIAGNOSIS — R0602 Shortness of breath: Secondary | ICD-10-CM
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
## 2023-01-11 NOTE — Patient Instructions (Signed)
Medication Instructions:  The current medical regimen is effective;  continue present plan and medications.  *If you need a refill on your cardiac medications before your next appointment, please call your pharmacy*  Testing/Procedures: Your physician has requested that you have an echocardiogram. Echocardiography is a painless test that uses sound waves to create images of your heart. It provides your doctor with information about the size and shape of your heart and how well your heart's chambers and valves are working. This procedure takes approximately one hour. There are no restrictions for this procedure. Please do NOT wear cologne, perfume, aftershave, or lotions (deodorant is allowed). Please arrive 15 minutes prior to your appointment time.  Follow-Up: At [facility], you and your health needs are our priority.  As part of our continuing mission to provide you with exceptional heart care, we have created designated Provider Care Teams.  These Care Teams include your primary Cardiologist (physician) and Advanced Practice Providers (APPs -  Physician Assistants and Nurse Practitioners) who all work together to provide you with the care you need, when you need it.  We recommend signing up for the patient portal called "MyChart".  Sign up information is provided on this After Visit Summary.  MyChart is used to connect with patients for Virtual Visits (Telemedicine).  Patients are able to view lab/test results, encounter notes, upcoming appointments, etc.  Non-urgent messages can be sent to your provider as well.   To learn more about what you can do with MyChart, go to [email or web address].    Your next appointment:   1 year(s)  Provider:   Dr Mark Skains      

## 2023-01-17 IMAGING — CT CT ANGIO CHEST
2 of 6 series · 16 of 46 positions shown · IV contrast (Omnipaque or Isovue)
Comparison: CT Abdomen and Pelvis 10/15/2020 and earlier. CTA chest
05/01/2020.

CLINICAL DATA: 64-year-old female with right rib and flank pain.
History of PE in April 2020. Abdominal pain with black tarry
stools. History of cholelithiasis. History of exploratory
laparotomy, perforated gastric ulcer in March 2020.

EXAM:
CT ANGIOGRAPHY CHEST
CT ABDOMEN AND PELVIS WITH CONTRAST
TECHNIQUE: Multidetector CT imaging of the chest was performed using the
standard protocol during bolus administration of intravenous
contrast. Multiplanar CT image reconstructions and MIPs were
obtained to evaluate the vascular anatomy. Multidetector CT imaging
of the abdomen and pelvis was performed using the standard protocol
during bolus administration of intravenous contrast.
CONTRAST:  100mL OMNIPAQUE IOHEXOL 350 MG/ML SOLN

[Series 3: pe axialthins · axial · 0.76mm/px · z∈[+1208,+1461]mm · 13 of 346 slices shown]
[im 15/346  lung]
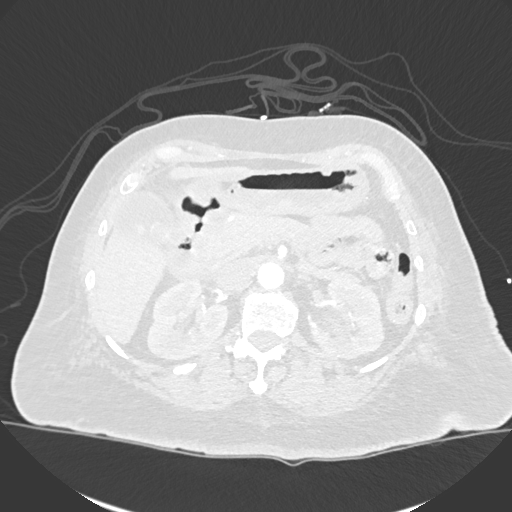
[im 44/346  soft-tissue]
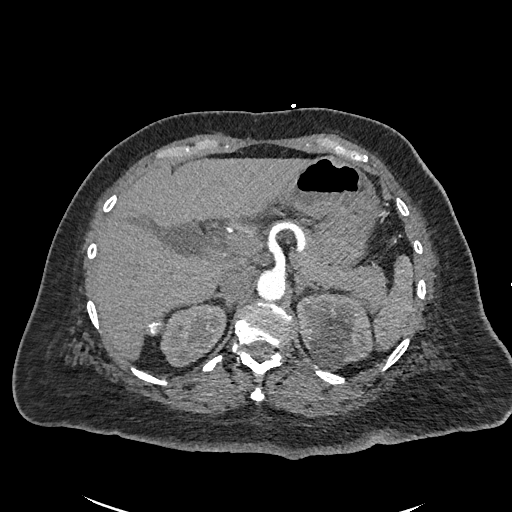
[im 72/346  lung]
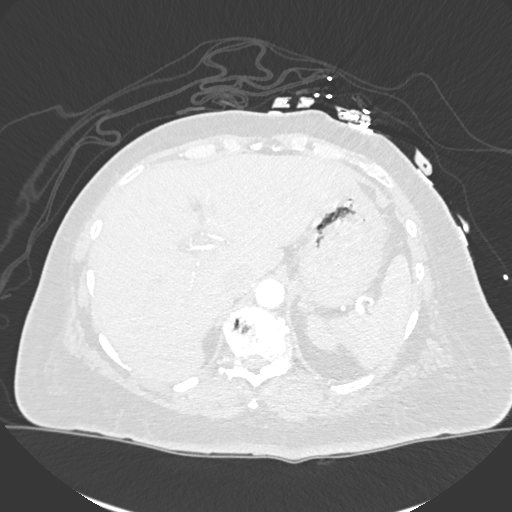
[im 101/346  soft-tissue]
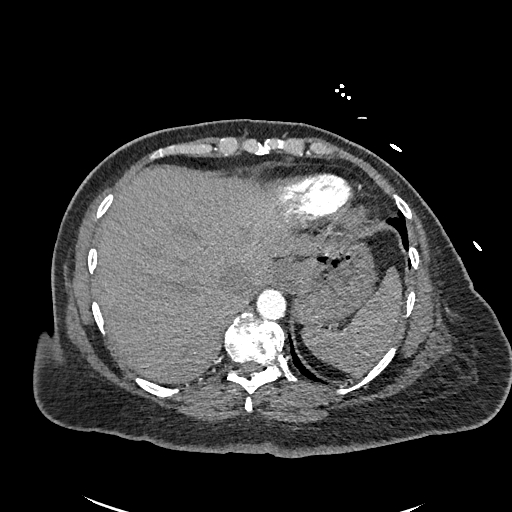
[im 116/346  lung]
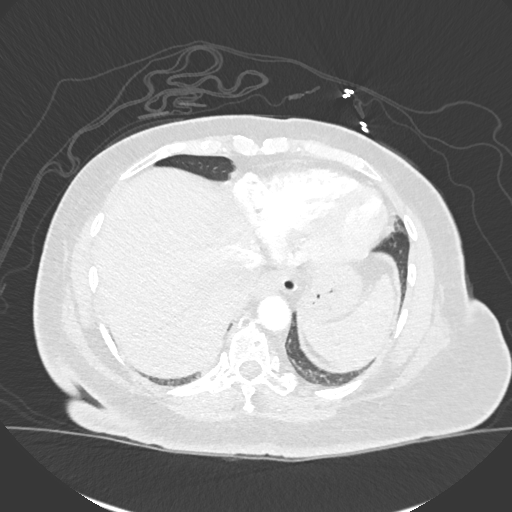
[im 144/346  soft-tissue]
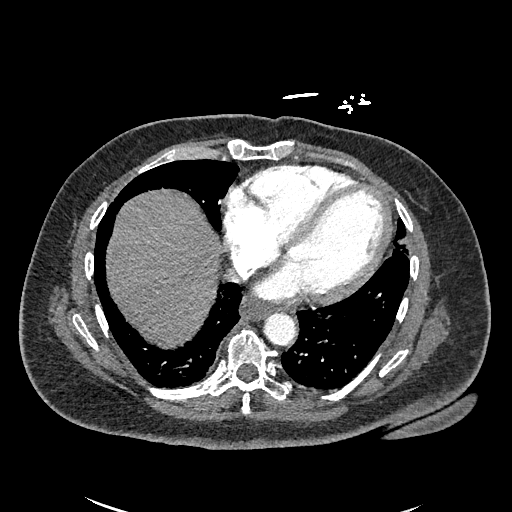
[im 173/346  lung]
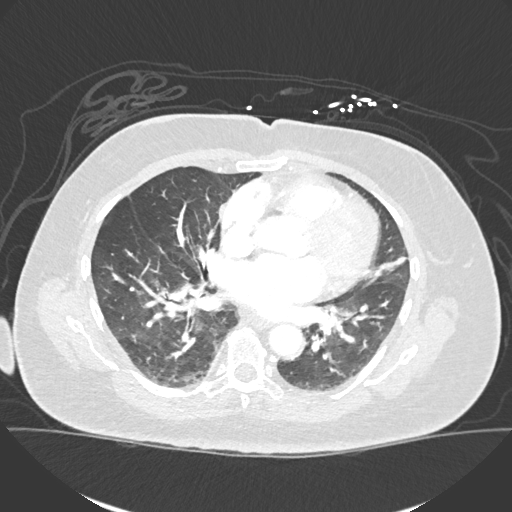
[im 202/346  soft-tissue]
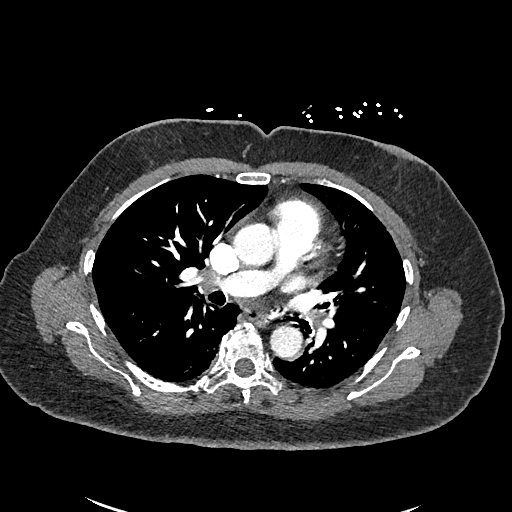
[im 231/346  lung]
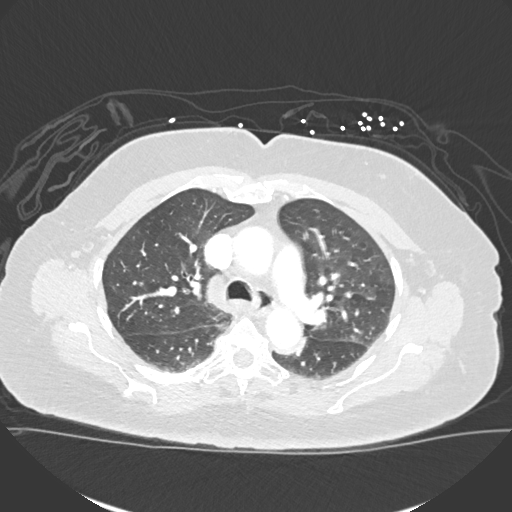
[im 245/346  soft-tissue]
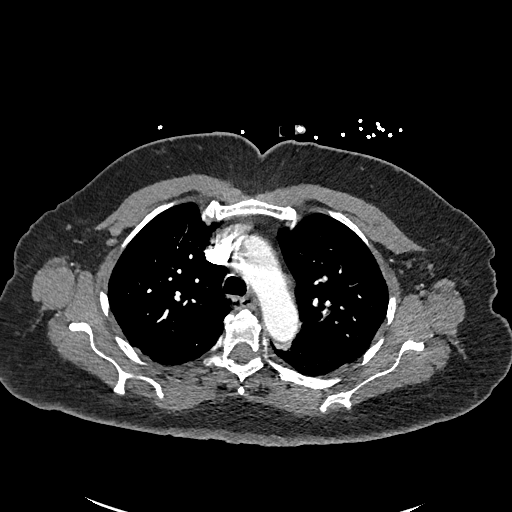
[im 274/346  lung]
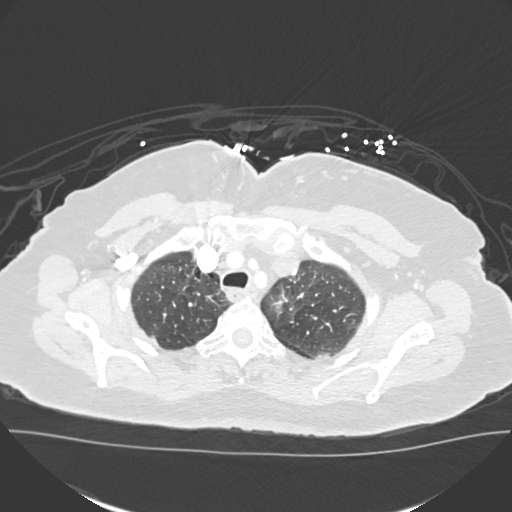
[im 302/346  soft-tissue]
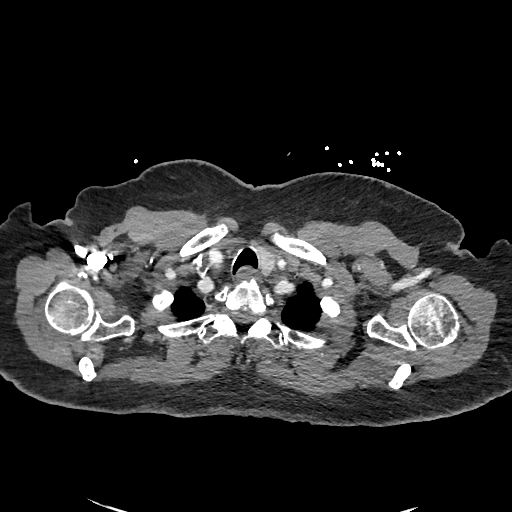
[im 331/346  lung]
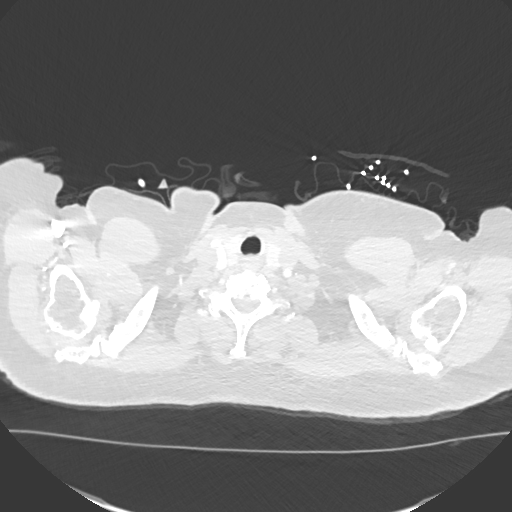

[Series 5: cor soft · coronal · 0.59mm/px · 3 of 141 slices shown]
[im 36/141  soft-tissue]
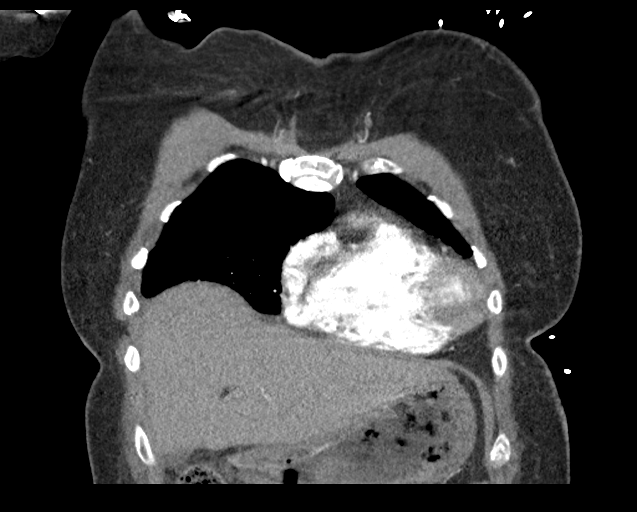
[im 71/141  soft-tissue]
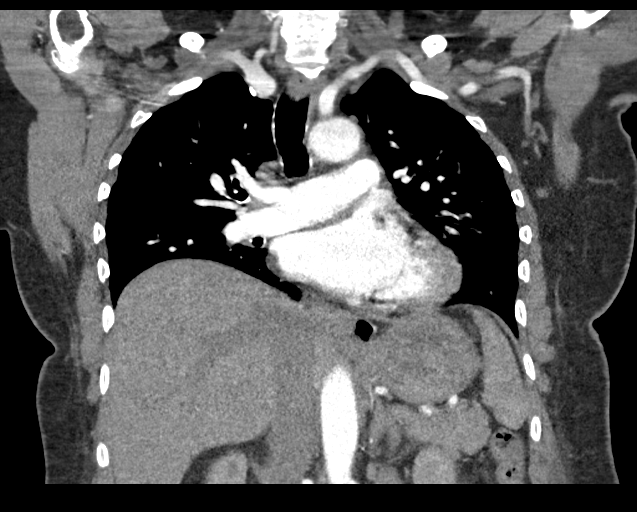
[im 106/141  soft-tissue]
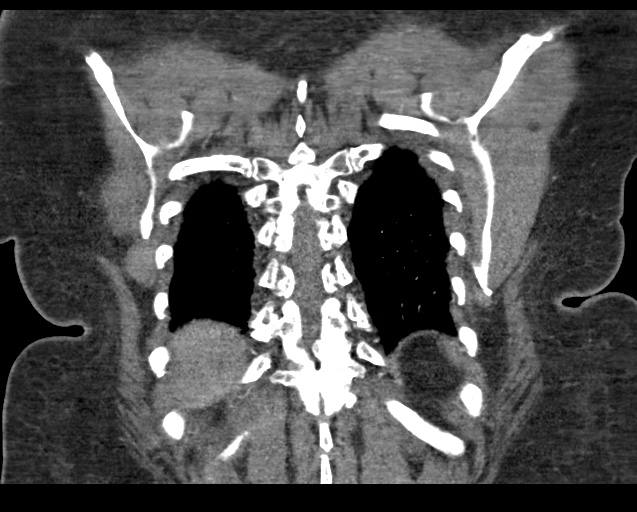

[16 of 46 positions shown; findings below may reference images not displayed]

FINDINGS: CTA CHEST FINDINGS

Cardiovascular: Good contrast bolus timing in the pulmonary arterial
tree.

No focal filling defect identified in the pulmonary arteries today
to suggest acute or residual pulmonary embolism.

Minimal calcified coronary artery atherosclerosis is evident. Mild
cardiomegaly. No pericardial effusion. Minimal plaque of the
thoracic aorta.

Mediastinum/Nodes: Negative.  No lymphadenopathy.

Lungs/Pleura: Somewhat lower lung volumes compared to last year.
Resolved pleural effusions and lower lobe collapse or consolidation.
Mild mosaic attenuation and mild peribronchial streaky opacity in
the lingula, favor a combination of atelectasis and mild gas
trapping. Major airways are patent. No areas suspicious for acute
respiratory infection.

Musculoskeletal: Chronic severe T10-T11 disc and endplate
degeneration. Chronic severe degeneration at the visible shoulders.
No acute osseous abnormality identified.

Review of the MIP images confirms the above findings.

CT ABDOMEN and PELVIS FINDINGS

Hepatobiliary: Chronic gallstones individually up to 16 mm.
Continued indistinct appearance of the porta hepatis which appears
mildly inflamed. But at the gallbladder fundus no wall thickening or
pericholecystic inflammation is evident. No hyperenhancement at the
gallbladder fossa. Stable liver parenchyma including tiny benign
appearing low-density area at the dome. No bile duct enlargement.

Pancreas: Abnormal partially calcified multi cystic appearing
pancreatic head mass as described on the CT last month appears
stable measuring 4.3 cm on series 4, image 37. No main pancreatic
ductal dilatation. No superimposed acute pancreatitis suspected.

However, along with the indistinct porta hepatis the gastric antrum
and proximal duodenum continue to appear indistinct and inflamed.
See coronal image 35 today. Compared to last month, the distal
stomach and proximal duodenum appear mildly improved. No
extraluminal gas or fluid identified. Proximal stomach and distal
duodenum appear within normal limits.

Spleen: Negative.

Adrenals/Urinary Tract: Adrenal glands remain normal. Stable kidneys
with bilateral parapelvic and exophytic renal cystic lesions which
appeared benign by MRI in [REDACTED] of last year. Symmetric renal
contrast excretion with no hydronephrosis or hydroureter. Diminutive
and unremarkable urinary bladder. Stable pelvic phleboliths.

Stomach/Bowel: Sigmoid diverticulosis again noted. No active
inflammation identified. Otherwise redundant large bowel. Retained
stool in the right colon and at the hepatic flexure. Normal appendix
visible on series 4, image 60. Decompressed and negative terminal
ileum. No dilated small bowel. Stomach and duodenum described above.
No free air. No free fluid.

Vascular/Lymphatic: Major arterial structures remain patent with
tortuosity but minimal atherosclerosis. Portal venous system is
patent.

No lymphadenopathy.

Reproductive: Stable lobulated fibroid uterus.

Other: No pelvic free fluid.

Musculoskeletal: Stable advanced lumbar disc and facet degeneration.
No acute osseous abnormality identified.

Review of the MIP images confirms the above findings.
IMPRESSION: CHEST:

1. Negative for acute or residual pulmonary embolus.
2. Mild scattered pulmonary atelectasis and gas trapping. No other
acute process in the chest.

ABDOMEN AND PELVIS:

1. Largely stable CT appearance of the abdomen and pelvis since
10/15/2020 (please see also that report):
2. Stable cystic and partially calcified pancreatic head mass,
cm.
3. Slightly improved appearance of the gastric antrum, duodenal
bulb, and porta hepatis from last month.
As before this may reflect improving peptic ulcer disease.
Underlying chronic cholelithiasis, without strong evidence of acute
cholecystitis today.
4. Fibroid uterus. Multiple renal cysts which had a benign MRI
appearance last yea. Sigmoid diverticulosis.

## 2023-01-18 IMAGING — DX DG ABDOMEN ACUTE W/ 1V CHEST
3 series · 3 of 3 positions shown · non-contrast
Comparison: CTA chest and CT Abdomen and Pelvis 11/10/2020, and
earlier.

CLINICAL DATA: 64-year-old female with persistent abdominal pain
and vomiting.

EXAM:
DG ABDOMEN ACUTE WITH 1 VIEW CHEST

[abdomen erect ap]
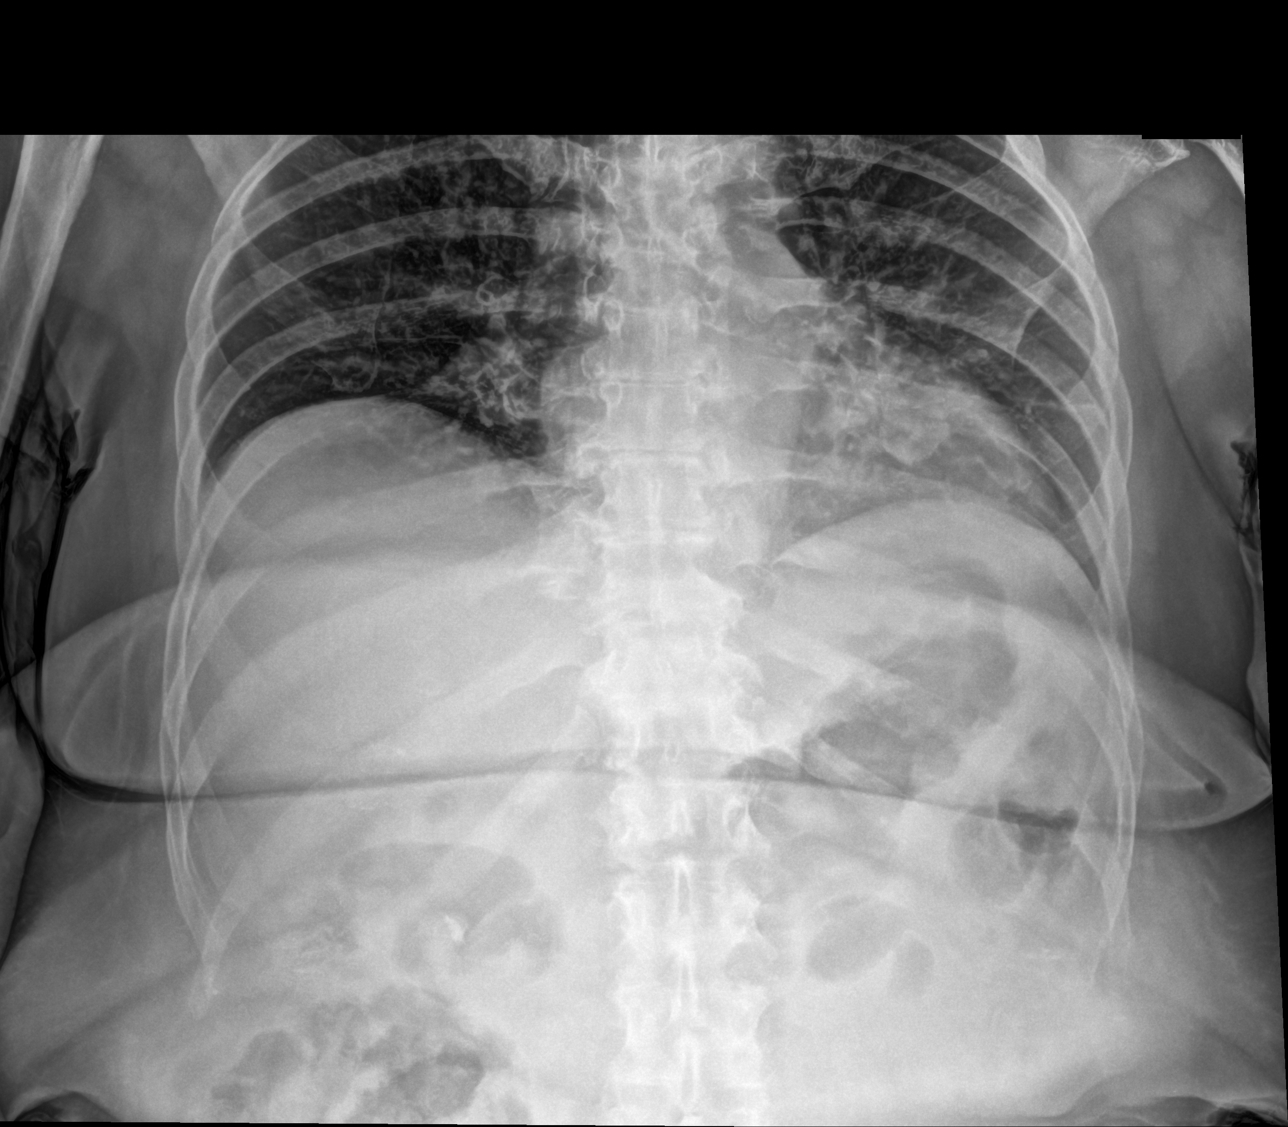

[abdomen supine]
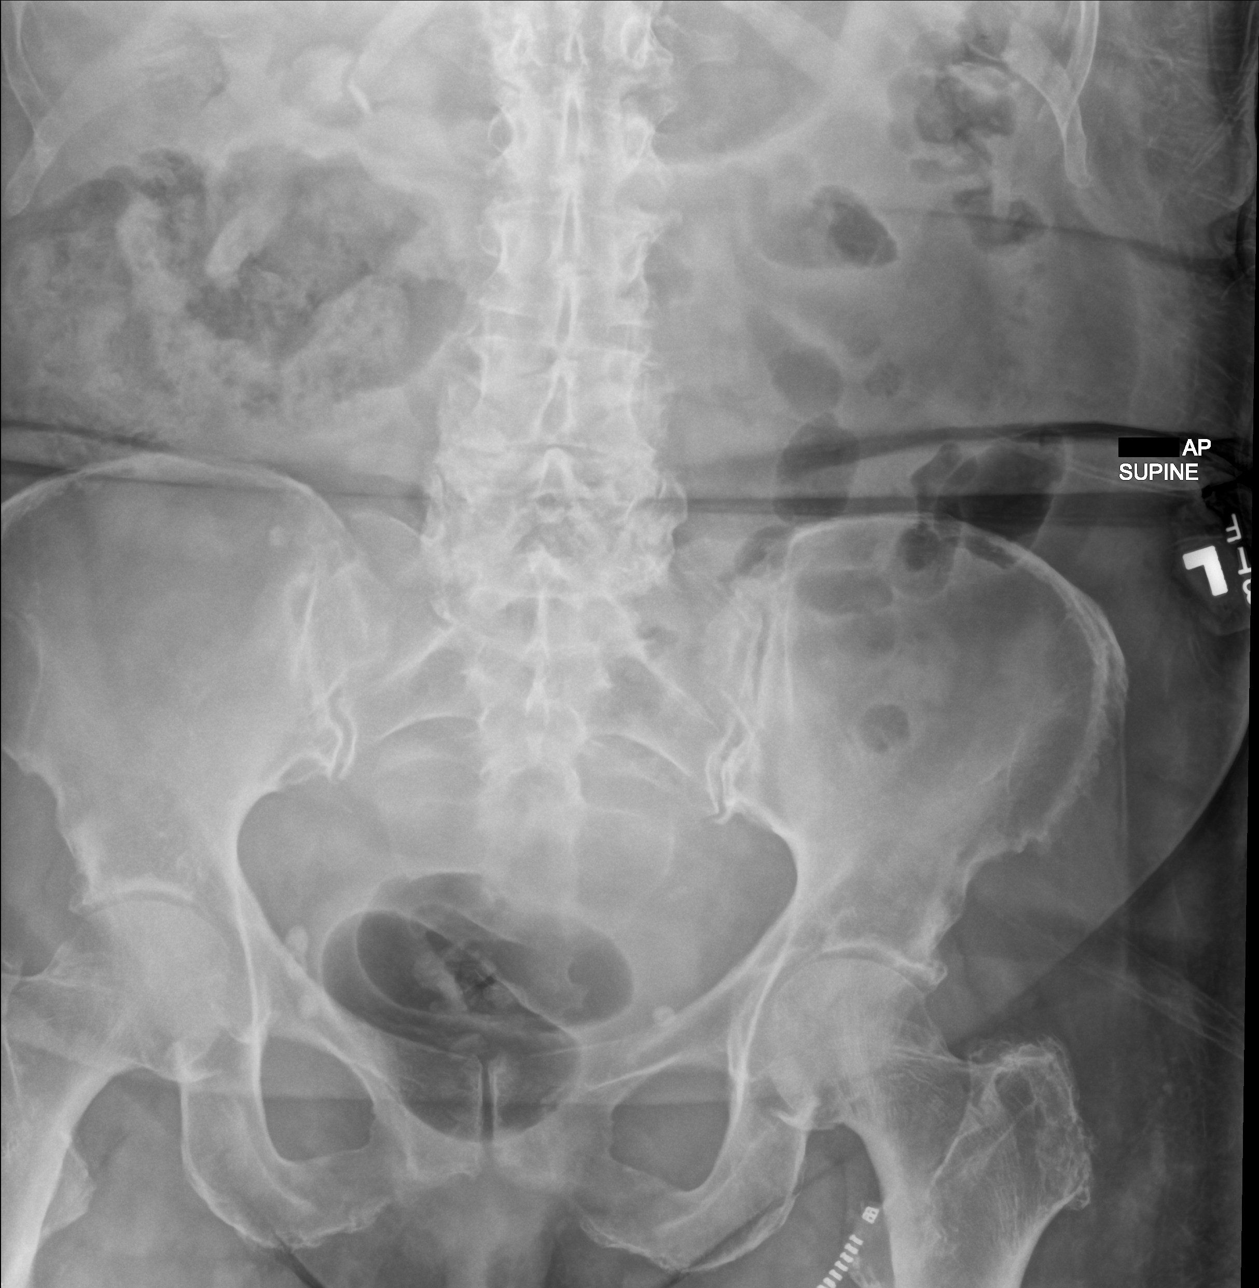

[chest ap]
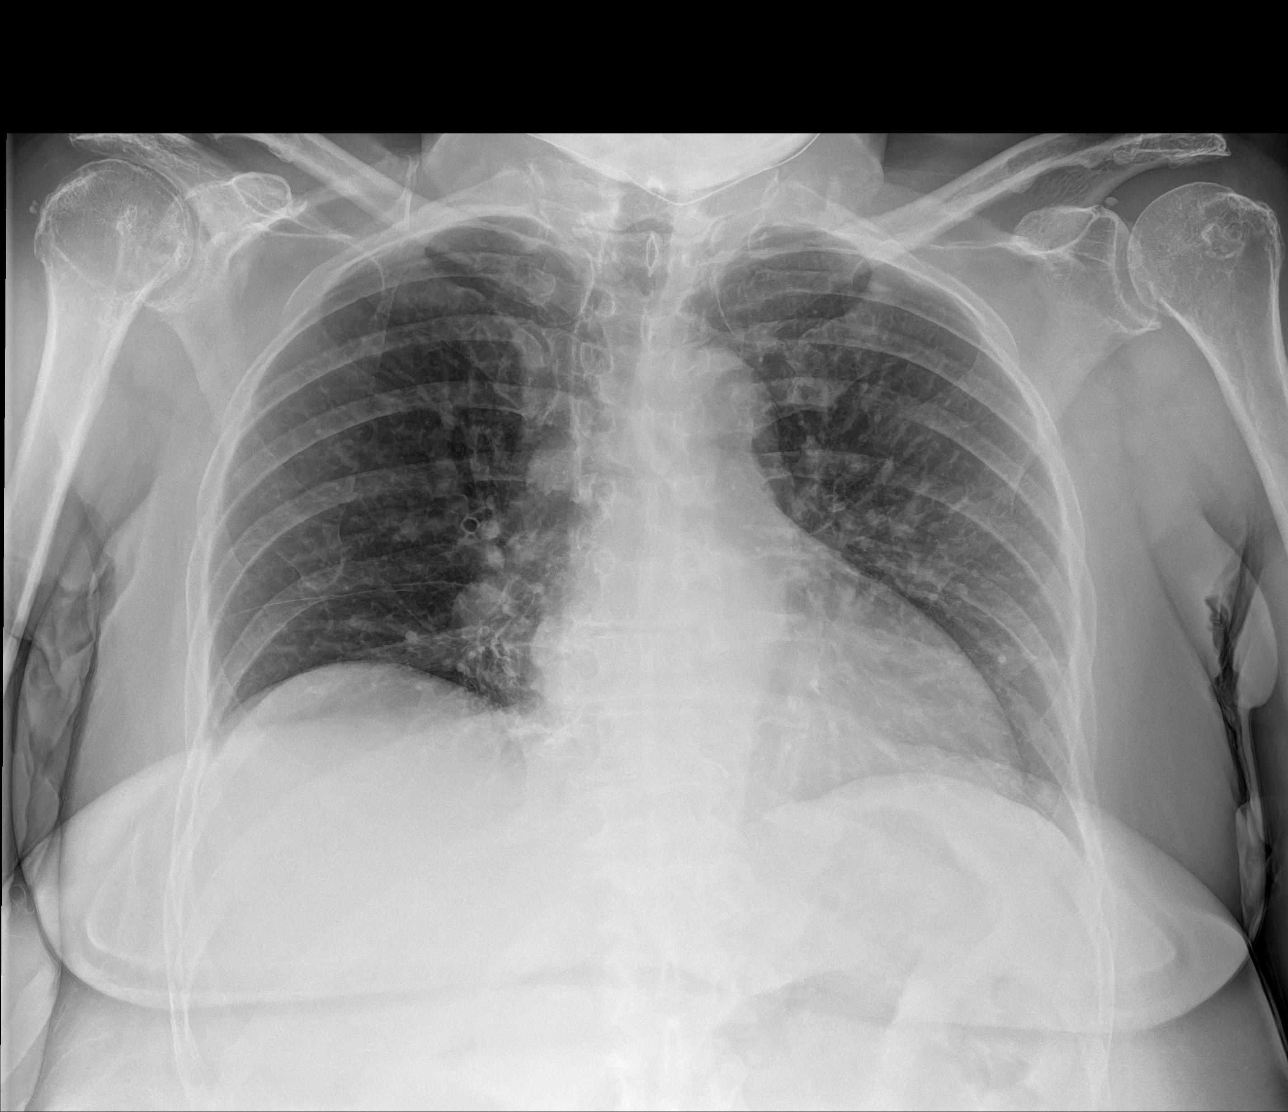

[3 of 3 positions shown; findings below may reference images not displayed]

FINDINGS: Portable AP upright view of the chest at 0479 hours. Lower lung
volumes. No acute pulmonary opacity. Stable cardiac size and
mediastinal contours. No pneumoperitoneum.

Portable upright and supine views of the abdomen 9943 hours. Non
obstructed bowel gas pattern. Cholelithiasis again noted. Pelvic
vascular calcifications. No acute osseous abnormality identified.
IMPRESSION: 1. Normal bowel gas pattern, no free air. Cholelithiasis again
noted.
2. Lower lung volumes with no acute cardiopulmonary abnormality.

## 2023-01-21 ENCOUNTER — Ambulatory Visit: Payer: Medicaid Other | Admitting: Dermatology

## 2023-02-05 ENCOUNTER — Ambulatory Visit: Payer: Medicare HMO | Admitting: Physician Assistant

## 2023-02-08 ENCOUNTER — Ambulatory Visit (HOSPITAL_COMMUNITY): Payer: Medicare HMO | Attending: Cardiology

## 2023-02-08 DIAGNOSIS — R0602 Shortness of breath: Secondary | ICD-10-CM | POA: Insufficient documentation

## 2023-02-08 LAB — ECHOCARDIOGRAM COMPLETE
Area-P 1/2: 4.15 cm2
S' Lateral: 2.5 cm

## 2023-02-22 ENCOUNTER — Ambulatory Visit: Payer: Medicare HMO | Admitting: Podiatry

## 2023-03-18 ENCOUNTER — Encounter: Payer: Self-pay | Admitting: Dermatology

## 2023-03-18 ENCOUNTER — Ambulatory Visit: Payer: Medicare HMO | Admitting: Dermatology

## 2023-03-18 VITALS — BP 118/63 | HR 59

## 2023-03-18 DIAGNOSIS — D2221 Melanocytic nevi of right ear and external auricular canal: Secondary | ICD-10-CM | POA: Diagnosis not present

## 2023-03-18 DIAGNOSIS — Z1283 Encounter for screening for malignant neoplasm of skin: Secondary | ICD-10-CM

## 2023-03-18 DIAGNOSIS — B351 Tinea unguium: Secondary | ICD-10-CM | POA: Diagnosis not present

## 2023-03-18 DIAGNOSIS — Z7189 Other specified counseling: Secondary | ICD-10-CM

## 2023-03-18 DIAGNOSIS — L814 Other melanin hyperpigmentation: Secondary | ICD-10-CM

## 2023-03-18 DIAGNOSIS — L820 Inflamed seborrheic keratosis: Secondary | ICD-10-CM | POA: Diagnosis not present

## 2023-03-18 DIAGNOSIS — W908XXA Exposure to other nonionizing radiation, initial encounter: Secondary | ICD-10-CM | POA: Diagnosis not present

## 2023-03-18 DIAGNOSIS — D1801 Hemangioma of skin and subcutaneous tissue: Secondary | ICD-10-CM

## 2023-03-18 DIAGNOSIS — Z79899 Other long term (current) drug therapy: Secondary | ICD-10-CM

## 2023-03-18 DIAGNOSIS — Z85828 Personal history of other malignant neoplasm of skin: Secondary | ICD-10-CM

## 2023-03-18 DIAGNOSIS — L578 Other skin changes due to chronic exposure to nonionizing radiation: Secondary | ICD-10-CM

## 2023-03-18 DIAGNOSIS — L821 Other seborrheic keratosis: Secondary | ICD-10-CM

## 2023-03-18 DIAGNOSIS — D492 Neoplasm of unspecified behavior of bone, soft tissue, and skin: Secondary | ICD-10-CM

## 2023-03-18 MED ORDER — TERBINAFINE HCL 250 MG PO TABS: 250.0000 mg | ORAL_TABLET | Freq: Every day | ORAL | 0 refills | Status: AC

## 2023-03-18 NOTE — Progress Notes (Signed)
Follow-Up Visit   Subjective  Maria Murphy is a 67 y.o. female who presents for the following: Skin Cancer Screening and Full Body Skin Exam. Hx of BCC. Area behind ear is raised, growing.   Check eyelids. C/O itching. Started recently. No tx.   The patient presents for Total-Body Skin Exam (TBSE) for skin cancer screening and mole check. The patient has spots, moles and lesions to be evaluated, some may be new or changing and the patient may have concern these could be cancer.    The following portions of the chart were reviewed this encounter and updated as appropriate: medications, allergies, medical history  Review of Systems:  No other skin or systemic complaints except as noted in HPI or Assessment and Plan.  Objective  Well appearing patient in no apparent distress; mood and affect are within normal limits.  A full examination was performed including scalp, head, eyes, ears, nose, lips, neck, chest, axillae, abdomen, back, buttocks, bilateral upper extremities, bilateral lower extremities, hands, feet, fingers, toes, fingernails, and toenails. All findings within normal limits unless otherwise noted below.   Relevant physical exam findings are noted in the Assessment and Plan.  left mid lower eyelid margin x1 Erythematous keratotic or waxy stuck-on papule or plaque.  Right Mid To Inferior Helix 0.4 cm brown papule         Assessment & Plan   SKIN CANCER SCREENING PERFORMED TODAY.  ACTINIC DAMAGE - Chronic condition, secondary to cumulative UV/sun exposure - diffuse scaly erythematous macules with underlying dyspigmentation - Recommend daily broad spectrum sunscreen SPF 30+ to sun-exposed areas, reapply every 2 hours as needed.  - Staying in the shade or wearing long sleeves, sun glasses (UVA+UVB protection) and wide brim hats (4-inch brim around the entire circumference of the hat) are also recommended for sun protection.  - Call for new or changing  lesions.  LENTIGINES, SEBORRHEIC KERATOSES, HEMANGIOMAS - Benign normal skin lesions - Benign-appearing - Call for any changes  MELANOCYTIC NEVI - Tan-brown and/or pink-flesh-colored symmetric macules and papules - Benign appearing on exam today - Observation - Call clinic for new or changing moles - Recommend daily use of broad spectrum spf 30+ sunscreen to sun-exposed areas.    HISTORY OF BASAL CELL CARCINOMA OF THE SKIN. L nose, MOHs 04/14/21  - No evidence of recurrence today - Recommend regular full body skin exams - Recommend daily broad spectrum sunscreen SPF 30+ to sun-exposed areas, reapply every 2 hours as needed.  - Call if any new or changing lesions are noted between office visits  ONYCHOMYCOSIS Exam: Thickened toenails with subungal debris c/w onychomycosis  Chronic and persistent condition with duration or expected duration over one year. Condition is symptomatic/ bothersome to patient. Not currently at goal.  Treatment Plan: Start Terbinafine 250 mg once daily for 1 month.  Terbinafine Counseling  Terbinafine is an anti-fungal medicine that can be applied to the skin (over the counter) or taken by mouth (prescription) to treat fungal infections. The pill version is often used to treat fungal infections of the nails or scalp. While most people do not have any side effects from taking terbinafine pills, some possible side effects of the medicine can include taste changes, headache, loss of smell, vision changes, nausea, vomiting, or diarrhea.   Rare side effects can include irritation of the liver, allergic reaction, or decrease in blood counts (which may show up as not feeling well or developing an infection). If you are concerned about any of these side  effects, please stop the medicine and call your doctor, or in the case of an emergency such as feeling very unwell, seek immediate medical care.    Labs from 11/11/2022 reviewed. LFT WNL.     Inflamed seborrheic  keratosis left mid lower eyelid margin x1  Symptomatic, irritating, patient would like treated.  Destruction of lesion - left mid lower eyelid margin x1 Complexity: simple   Destruction method: cryotherapy   Informed consent: discussed and consent obtained   Timeout:  patient name, date of birth, surgical site, and procedure verified Lesion destroyed using liquid nitrogen: Yes   Region frozen until ice ball extended beyond lesion: Yes   Outcome: patient tolerated procedure well with no complications   Post-procedure details: wound care instructions given   Additional details:  Prior to procedure, discussed risks of blister formation, small wound, skin dyspigmentation, or rare scar following cryotherapy. Recommend Vaseline ointment to treated areas while healing.   Neoplasm of skin Right Mid To Inferior Helix  Epidermal / dermal shaving  Lesion diameter (cm):  0.4 Informed consent: discussed and consent obtained   Timeout: patient name, date of birth, surgical site, and procedure verified   Procedure prep:  Patient was prepped and draped in usual sterile fashion Prep type:  Isopropyl alcohol Anesthesia: the lesion was anesthetized in a standard fashion   Anesthetic:  1% lidocaine w/ epinephrine 1-100,000 buffered w/ 8.4% NaHCO3 Instrument used: flexible razor blade   Hemostasis achieved with: pressure, aluminum chloride and electrodesiccation   Outcome: patient tolerated procedure well   Post-procedure details: sterile dressing applied and wound care instructions given   Dressing type: bandage and petrolatum    Specimen 1 - Surgical pathology Differential Diagnosis: R/O dysplastic nevus  Check Margins: No   Return in about 1 year (around 03/17/2024) for TBSE, HxBCC; Toenail recheck in 2 months.  I, Lawson Radar, CMA, am acting as scribe for Armida Sans, MD.   Documentation: I have reviewed the above documentation for accuracy and completeness, and I agree with the  above.  Armida Sans, MD

## 2023-03-18 NOTE — Patient Instructions (Addendum)
Start Terbinafine 250 mg once daily for 1 month.  Terbinafine Counseling  Terbinafine is an anti-fungal medicine that can be applied to the skin (over the counter) or taken by mouth (prescription) to treat fungal infections. The pill version is often used to treat fungal infections of the nails or scalp. While most people do not have any side effects from taking terbinafine pills, some possible side effects of the medicine can include taste changes, headache, loss of smell, vision changes, nausea, vomiting, or diarrhea.   Rare side effects can include irritation of the liver, allergic reaction, or decrease in blood counts (which may show up as not feeling well or developing an infection). If you are concerned about any of these side effects, please stop the medicine and call your doctor, or in the case of an emergency such as feeling very unwell, seek immediate medical care.     Cryotherapy Aftercare  Wash gently with soap and water everyday.   Apply Vaseline Jelly daily until healed.   Wound Care Instructions  Cleanse wound gently with soap and water once a day then pat dry with clean gauze. Apply a thin coat of Petrolatum (petroleum jelly, "Vaseline") over the wound (unless you have an allergy to this). We recommend that you use a new, sterile tube of Vaseline. Do not pick or remove scabs. Do not remove the yellow or white "healing tissue" from the base of the wound.  Cover the wound with fresh, clean, nonstick gauze and secure with paper tape. You may use Band-Aids in place of gauze and tape if the wound is small enough, but would recommend trimming much of the tape off as there is often too much. Sometimes Band-Aids can irritate the skin.  You should call the office for your biopsy report after 1 week if you have not already been contacted.  If you experience any problems, such as abnormal amounts of bleeding, swelling, significant bruising, significant pain, or evidence of infection,  please call the office immediately.  FOR ADULT SURGERY PATIENTS: If you need something for pain relief you may take 1 extra strength Tylenol (acetaminophen) AND 2 Ibuprofen (200mg  each) together every 4 hours as needed for pain. (do not take these if you are allergic to them or if you have a reason you should not take them.) Typically, you may only need pain medication for 1 to 3 days.      Recommend daily broad spectrum sunscreen SPF 30+ to sun-exposed areas, reapply every 2 hours as needed. Call for new or changing lesions.  Staying in the shade or wearing long sleeves, sun glasses (UVA+UVB protection) and wide brim hats (4-inch brim around the entire circumference of the hat) are also recommended for sun protection.   Melanoma ABCDEs  Melanoma is the most dangerous type of skin cancer, and is the leading cause of death from skin disease.  You are more likely to develop melanoma if you: Have light-colored skin, light-colored eyes, or red or blond hair Spend a lot of time in the sun Tan regularly, either outdoors or in a tanning bed Have had blistering sunburns, especially during childhood Have a close family member who has had a melanoma Have atypical moles or large birthmarks  Early detection of melanoma is key since treatment is typically straightforward and cure rates are extremely high if we catch it early.   The first sign of melanoma is often a change in a mole or a new dark spot.  The ABCDE system is a  way of remembering the signs of melanoma.  A for asymmetry:  The two halves do not match. B for border:  The edges of the growth are irregular. C for color:  A mixture of colors are present instead of an even brown color. D for diameter:  Melanomas are usually (but not always) greater than 6mm - the size of a pencil eraser. E for evolution:  The spot keeps changing in size, shape, and color.  Please check your skin once per month between visits. You can use a small mirror in  front and a large mirror behind you to keep an eye on the back side or your body.   If you see any new or changing lesions before your next follow-up, please call to schedule a visit.  Please continue daily skin protection including broad spectrum sunscreen SPF 30+ to sun-exposed areas, reapplying every 2 hours as needed when you're outdoors.   Staying in the shade or wearing long sleeves, sun glasses (UVA+UVB protection) and wide brim hats (4-inch brim around the entire circumference of the hat) are also recommended for sun protection.    Dermatological/Plastic surgeon Normand Sloop, MD Address: [address] [place], [place] [address] Phone: [phone]   Due to recent changes in healthcare laws, you may see results of your pathology and/or laboratory studies on MyChart before the doctors have had a chance to review them. We understand that in some cases there may be results that are confusing or concerning to you. Please understand that not all results are received at the same time and often the doctors may need to interpret multiple results in order to provide you with the best plan of care or course of treatment. Therefore, we ask that you please give [place] 2 business days to thoroughly review all your results before contacting the office for clarification. Should we see a critical lab result, you will be contacted sooner.   If You Need Anything After Your Visit  If you have any questions or concerns for your doctor, please call our main line at [phone] and press option 4 to reach your doctor's medical assistant. If no one answers, please leave a voicemail as directed and we will return your call as soon as possible. Messages left after 4 pm will be answered the following business day.   You may also send [place] a message via MyChart. We typically respond to MyChart messages within 1-2 business days.  For prescription refills, please ask your pharmacy to contact our office. Our fax  number is [phone].  If you have an urgent issue when the clinic is closed that cannot wait until the next business day, you can page your doctor at the number below.    Please note that while we do our best to be available for urgent issues outside of office hours, we are not available 24/7.   If you have an urgent issue and are unable to reach [place], you may choose to seek medical care at your doctor's office, retail clinic, urgent care center, or emergency room.  If you have a medical emergency, please immediately call 911 or go to the emergency department.  Pager Numbers  - Dr. Gwen Pounds: [phone]  - Dr. Neale Burly: [phone]  - Dr. Roseanne Reno: [phone]  In the event of inclement weather, please call our main line at [phone] for an update on the status of any delays or closures.  Dermatology Medication Tips: Please keep the boxes that topical medications come  in in order to help keep track of the instructions about where and how to use these. Pharmacies typically print the medication instructions only on the boxes and not directly on the medication tubes.   If your medication is too expensive, please contact our office at [phone] option 4 or send [place] a message through MyChart.   We are unable to tell what your co-pay for medications will be in advance as this is different depending on your insurance coverage. However, we may be able to find a substitute medication at lower cost or fill out paperwork to get insurance to cover a needed medication.   If a prior authorization is required to get your medication covered by your insurance company, please allow [place] 1-2 business days to complete this process.  Drug prices often vary depending on where the prescription is filled and some pharmacies may offer cheaper prices.  The website www.goodrx.com contains coupons for medications through different pharmacies. The prices here do not account for what the cost may be with help  from insurance (it may be cheaper with your insurance), but the website can give you the price if you did not use any insurance.  - You can print the associated coupon and take it with your prescription to the pharmacy.  - You may also stop by our office during regular business hours and pick up a GoodRx coupon card.  - If you need your prescription sent electronically to a different pharmacy, notify our office through [facility] or by phone at [phone] option 4.     Si Usted Necesita Algo Despus de Su Visita  Tambin puede enviarnos un mensaje a travs de Clinical cytogeneticist. Por lo general respondemos a los mensajes de MyChart en el transcurso de 1 a 2 das hbiles.  Para renovar recetas, por favor pida a su farmacia que se ponga en contacto con nuestra oficina. Annie Sable de fax es [place] [phone].  Si tiene un asunto urgente cuando la clnica est cerrada y que no puede esperar hasta el siguiente da hbil, puede llamar/localizar a su doctor(a) al nmero que aparece a continuacin.   Por favor, tenga en cuenta que aunque hacemos todo lo posible para estar disponibles para asuntos urgentes fuera del horario de [place], no estamos disponibles las 24 horas del da, los 7 [address]  [address] de la [place].   Si tiene un problema urgente y no puede comunicarse con nosotros, puede optar por buscar atencin mdica  en el consultorio de su doctor(a), en una clnica privada, en un centro de atencin urgente o en una sala de emergencias.  Si tiene Engineer, drilling, por favor llame inmediatamente al 911 o vaya a la sala de emergencias.  Nmeros de bper  - Dr. Gwen Pounds: [phone]  - Dra. Moye: [phone]  - Dra. Roseanne Reno: [phone]  En caso de inclemencias del [place], por favor llame a Lacy Duverney principal al [phone] para una actualizacin sobre el [place] de cualquier retraso o cierre.  Consejos para la medicacin en dermatologa: Por favor, guarde las cajas en las que vienen los  medicamentos de uso tpico para ayudarle a seguir las instrucciones sobre dnde y cmo usarlos. Las farmacias generalmente imprimen las instrucciones del medicamento slo en las cajas y no directamente en los tubos del [place].   Si su medicamento es muy caro, por favor, pngase en contacto con Rolm Gala llamando al [phone] y presione la opcin 4 o envenos un mensaje a travs de Clinical cytogeneticist.   No podemos decirle cul  ser su copago por los medicamentos por adelantado ya que esto es diferente dependiendo de la cobertura de su seguro. Sin embargo, es posible que podamos encontrar un medicamento sustituto a Audiological scientist un formulario para que el seguro cubra el medicamento que se considera necesario.   Si se requiere una autorizacin previa para que su compaa de seguros [place] su medicamento, por favor permtanos de 1 a 2 das hbiles para completar [address]  Los precios de los medicamentos varan con frecuencia dependiendo del Environmental consultant de dnde se surte la receta y alguna farmacias pueden ofrecer precios ms baratos.  El sitio web www.goodrx.com tiene cupones para medicamentos de Health and safety inspector. Los precios aqu no tienen en cuenta lo que podra costar con la ayuda del seguro (puede ser ms barato con su seguro), pero el sitio web puede darle el precio si no utiliz Tourist information centre manager.  - Puede imprimir el cupn correspondiente y llevarlo con su receta a la farmacia.  - Tambin puede pasar por nuestra oficina durante el horario de atencin regular y Education officer, museum una tarjeta de cupones de GoodRx.  - Si necesita que su receta se enve electrnicamente a una farmacia diferente, informe a nuestra oficina a travs de MyChart de [place] o por telfono llamando al [phone] y presione la opcin 4.

## 2023-03-22 ENCOUNTER — Encounter: Payer: Self-pay | Admitting: Dermatology

## 2023-03-30 ENCOUNTER — Telehealth: Payer: Self-pay

## 2023-03-30 NOTE — Telephone Encounter (Signed)
Discussed biopsy results with pt  °

## 2023-03-30 NOTE — Telephone Encounter (Signed)
-----   Message from Armida Sans sent at 03/25/2023  6:03 PM EDT ----- Diagnosis Skin , right mid to inferior helix MELANOCYTIC NEVUS  Benign mole No further treatment needed

## 2023-04-19 ENCOUNTER — Telehealth: Payer: Self-pay

## 2023-04-19 DIAGNOSIS — K862 Cyst of pancreas: Secondary | ICD-10-CM

## 2023-04-19 NOTE — Telephone Encounter (Signed)
-----   Message from Nurse Hailea Eaglin P sent at 04/17/2022 11:19 AM EDT ----- 1 year MRI/MRCP recall/order to be placed in the system.

## 2023-04-19 NOTE — Telephone Encounter (Signed)
MRI MRCP order entered  Left message on machine to call back

## 2023-04-20 NOTE — Telephone Encounter (Signed)
Left message on machine to call back  

## 2023-04-21 NOTE — Telephone Encounter (Signed)
The patient has been notified of this information and all questions answered. The pt is aware she will be called for MRI by the schedulers. She will reach back out if she does not hear from that office in 1 week.

## 2023-04-21 NOTE — Telephone Encounter (Signed)
Attempted to reach the pt by phone- MRI order entered   Someone answered the phone but never spoke. I called again and line was busy will attempt at a later time

## 2023-05-05 NOTE — Progress Notes (Signed)
Patient stopped by [facility] with flyer for referral Would like an am class but cannot do Mondays/Wednesdays.  Needs a T/TH am class (before 12 N).  Advised will contact her when I have a class.   Noted she lives in [place]. Will reach to Coach there to check schedule

## 2023-05-16 ENCOUNTER — Ambulatory Visit (HOSPITAL_COMMUNITY)
Admission: RE | Admit: 2023-05-16 | Discharge: 2023-05-16 | Disposition: A | Discharge status: Home / Self Care | Payer: Medicare HMO | Source: Ambulatory Visit | Attending: Gastroenterology | Admitting: Gastroenterology

## 2023-05-16 ENCOUNTER — Other Ambulatory Visit: Payer: Self-pay | Admitting: Gastroenterology

## 2023-05-16 DIAGNOSIS — K862 Cyst of pancreas: Secondary | ICD-10-CM | POA: Diagnosis present

## 2023-05-16 MED ORDER — GADOBUTROL 1 MMOL/ML IV SOLN
6.0000 mL | Freq: Once | INTRAVENOUS | Status: AC | PRN
  Administered 2023-05-16: 6 mL via INTRAVENOUS

## 2023-05-18 NOTE — Progress Notes (Signed)
Pt stopped by office today at [facility]. Wants to do PREP with friend. Will start at [facility] on 06/14/23 MW 230p-345p x 12 wks

## 2023-06-03 ENCOUNTER — Ambulatory Visit: Payer: Medicare HMO | Admitting: Dermatology

## 2023-06-07 ENCOUNTER — Encounter: Payer: Self-pay | Admitting: Podiatry

## 2023-06-07 ENCOUNTER — Ambulatory Visit (INDEPENDENT_AMBULATORY_CARE_PROVIDER_SITE_OTHER): Payer: Medicare HMO | Admitting: Podiatry

## 2023-06-07 DIAGNOSIS — M21611 Bunion of right foot: Secondary | ICD-10-CM | POA: Diagnosis not present

## 2023-06-07 DIAGNOSIS — M21612 Bunion of left foot: Secondary | ICD-10-CM | POA: Diagnosis not present

## 2023-06-07 NOTE — Progress Notes (Signed)
LM with friend will delay start of PREP.  Today's intake cancelled. Will contact when I am able to schedule

## 2023-06-08 ENCOUNTER — Other Ambulatory Visit: Payer: Self-pay | Admitting: Nurse Practitioner

## 2023-06-08 DIAGNOSIS — W19XXXA Unspecified fall, initial encounter: Secondary | ICD-10-CM

## 2023-06-09 ENCOUNTER — Encounter: Payer: Self-pay | Admitting: Nurse Practitioner

## 2023-06-09 NOTE — Progress Notes (Signed)
Subjective:   Patient ID: Maria Murphy, female   DOB: 67 y.o.   MRN: [id number]   HPI Patient presents stating that his bunion on my right has really been bothering me and the left also but the right actually is worse even though it is not as bad.  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  I did discuss doing the simplest procedure possible and doing a modified McBride removing the evidence on the medial side first metatarsal head.  The advantage of the surgical be very short and she will walk on it immediately which should help prevent any chance for DVT or for pulmonary embolism.  Patient does not feel any risk from that standpoint and I do think given the fact she will be more mobile that it does decrease the risk significantly.  At this point I did allow her to read the consent form going over alternative treatments complications and risk and after extensive review she signs and she is scheduled for outpatient procedure

## 2023-06-10 ENCOUNTER — Encounter (HOSPITAL_COMMUNITY): Payer: Self-pay | Admitting: Hematology

## 2023-06-10 ENCOUNTER — Ambulatory Visit
Admission: RE | Admit: 2023-06-10 | Discharge: 2023-06-10 | Disposition: A | Discharge status: Home / Self Care | Payer: Medicare HMO | Source: Ambulatory Visit | Attending: Nurse Practitioner | Admitting: Nurse Practitioner

## 2023-06-10 DIAGNOSIS — W19XXXA Unspecified fall, initial encounter: Secondary | ICD-10-CM

## 2023-06-21 ENCOUNTER — Other Ambulatory Visit: Payer: Medicare HMO

## 2023-07-05 ENCOUNTER — Telehealth: Payer: Self-pay | Admitting: Podiatry

## 2023-07-05 NOTE — Telephone Encounter (Signed)
SPOKE WITH PT TO INFORM HER THAT HER 07/06/23 SURGERY HAS BEEN CANCELLED DUE TO DENIAL FROM HER INSURANCE COMPANY. PT IS NOW AWARE, THE SURGERY CENTER HAS BEEN MADE AWARE, DR.REGAL HAS BEEN MADE AWARE AND ALL POST OP VISITS HAVE BEEN CANCELLED. PT WOULD TO MAKE ANOTHER APPOINTMENT TO COME IN AND SEE DR.REGAL.

## 2023-07-12 ENCOUNTER — Encounter: Payer: Self-pay | Admitting: Podiatry

## 2023-07-12 ENCOUNTER — Ambulatory Visit (INDEPENDENT_AMBULATORY_CARE_PROVIDER_SITE_OTHER): Payer: Medicare HMO | Admitting: Podiatry

## 2023-07-12 ENCOUNTER — Ambulatory Visit (INDEPENDENT_AMBULATORY_CARE_PROVIDER_SITE_OTHER): Payer: Medicare HMO

## 2023-07-12 ENCOUNTER — Encounter: Payer: Medicare HMO | Admitting: Podiatry

## 2023-07-12 DIAGNOSIS — L600 Ingrowing nail: Secondary | ICD-10-CM | POA: Diagnosis not present

## 2023-07-12 DIAGNOSIS — M21612 Bunion of left foot: Secondary | ICD-10-CM | POA: Diagnosis not present

## 2023-07-12 DIAGNOSIS — M21611 Bunion of right foot: Secondary | ICD-10-CM

## 2023-07-12 DIAGNOSIS — M21619 Bunion of unspecified foot: Secondary | ICD-10-CM

## 2023-07-12 NOTE — Progress Notes (Signed)
Subjective:   Patient ID: Maria Murphy, female   DOB: 67 y.o.   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  She understands this is permanent procedure and I infiltrated each big toe 60 mg like Marcaine mixture sterile prep done using sterile instrumentation removed the hallux nails exposed matrix applied phenol for applications 30 seconds to each nailbed followed by alcohol lavage sterile dressing gave instructions on soaks wear dressings 24 hours take them off earlier if throbbing were to occur.  For the bunion continue conservative care consisting of soaks wider shoe mesh materials and padding.  X-rays do indicate structural bunion deformity right with redness and pain and prominence around the first metatarsal head

## 2023-07-12 NOTE — Patient Instructions (Signed)

## 2023-07-26 ENCOUNTER — Encounter: Payer: Medicare HMO | Admitting: Podiatry

## 2023-08-04 ENCOUNTER — Ambulatory Visit (INDEPENDENT_AMBULATORY_CARE_PROVIDER_SITE_OTHER): Payer: Medicare HMO | Admitting: Podiatry

## 2023-08-04 DIAGNOSIS — M7751 Other enthesopathy of right foot: Secondary | ICD-10-CM | POA: Diagnosis not present

## 2023-08-04 DIAGNOSIS — M7752 Other enthesopathy of left foot: Secondary | ICD-10-CM

## 2023-08-04 MED ORDER — TRIAMCINOLONE ACETONIDE 10 MG/ML IJ SUSP
10.0000 mg | Freq: Once | INTRAMUSCULAR | Status: AC
Start: 2023-08-04 — End: 2023-08-04
  Administered 2023-08-04: 10 mg via INTRA_ARTICULAR

## 2023-08-05 NOTE — Progress Notes (Signed)
Subjective:   Patient ID: Maria Murphy, female   DOB: 67 y.o.   MRN: [id number]   HPI Patient states her nails are doing well from removal but still having problems with the bunions of both feet stating that they are bothersome for her and inflamed with the holidays coming   ROS      Objective:  Physical Exam  Neurovascular status intact with chronic structural bunion deformity bilateral that has been present for a long time and has inflammation fluid around the medial side with well-healed nail sites bilateral      Assessment:  Structural bunion chronic in nature right left foot with inflammation fluid around the first MPJ and well-healed nail sites bilateral     Plan:  H&P reviewed conditions and were gena try to get her through the next month or 2 because she eventually is going to need surgery on bunions.  At this point I did do sterile prep I injected around the first MPJ 3 mg dexamethasone Kenalog 5 mg Xylocaine advised on wider shoes and will be seen back to discuss surgical intervention in the next 4 to 6 weeks

## 2023-09-01 ENCOUNTER — Encounter (HOSPITAL_COMMUNITY): Payer: Self-pay | Admitting: Hematology

## 2023-09-08 ENCOUNTER — Encounter: Payer: Self-pay | Admitting: Podiatry

## 2023-09-08 ENCOUNTER — Ambulatory Visit: Payer: 59 | Admitting: Podiatry

## 2023-09-08 DIAGNOSIS — M21611 Bunion of right foot: Secondary | ICD-10-CM | POA: Diagnosis not present

## 2023-09-08 DIAGNOSIS — M21619 Bunion of unspecified foot: Secondary | ICD-10-CM

## 2023-09-10 NOTE — Progress Notes (Signed)
Subjective:   Patient ID: Maria Murphy, female   DOB: 68 y.o.   MRN: [id number]   HPI Patient states her nails are doing great and crusted over and healed beautifully and she wants to have correction of her bunion understanding it will not be a full correction but she wants to at least be able to get rid of the bump which has become so aggravating for her right over left foot   ROS      Objective:  Physical Exam  Neuro vascular status intact patient does have moderate to severe osteoporosis does have a significant bunion deformity bilateral with redness around the first metatarsal head both feet and inability to wear shoe gear with any degree of comfort and has tried wider shoes has tried padding has tried soaks without relief of symptoms chronic bunion     Assessment:  Deformity bilateral with very painful     Plan:  H&P reviewed at great length.  We did discuss bunion correction and I did discuss that I want to do a distal osteotomy we may not be able to get complete correction due to the osteoporosis and cyst but I am very concerned about fusion from her and healing.  She agrees with this approach wants to take this understands complications and alternative treatments and after extensive review signed consent form scheduled outpatient surgery [facility].  I did go ahead today and I dispensed air fracture walker that I want her to get used to prior to surgery and went over everything associated with boot usage and she will start using this at this time

## 2023-09-15 ENCOUNTER — Telehealth: Payer: Self-pay | Admitting: Podiatry

## 2023-09-15 NOTE — Telephone Encounter (Signed)
DOS-10/05/23  Serafina Royals [id number]  [employer] EFFECTIVE DATE-08/25/23  DEDUCTIBLE- $257.00 WITH REMAINING $257.00 OOP-$9350.00 WITH REMAINING $9350.00 COINSURANCE- 20%  PER THE [employer] PORTAL, PRIOR AUTH IS NOT REQUIRED FOR CPT CODE 45409.  AUTH Decision ID #: [id number]

## 2023-09-20 ENCOUNTER — Encounter: Payer: Self-pay | Admitting: Gastroenterology

## 2023-10-04 MED ORDER — ONDANSETRON HCL 4 MG PO TABS: 4.0000 mg | ORAL_TABLET | Freq: Three times a day (TID) | ORAL | 0 refills | Status: DC | PRN

## 2023-10-04 MED ORDER — OXYCODONE-ACETAMINOPHEN 10-325 MG PO TABS: 1.0000 | ORAL_TABLET | Freq: Four times a day (QID) | ORAL | 0 refills | Status: AC | PRN

## 2023-10-04 NOTE — Addendum Note (Signed)
 Addended by: Brandt Cake on: 10/04/2023 01:31 PM   Modules accepted: Orders

## 2023-10-05 DIAGNOSIS — M2011 Hallux valgus (acquired), right foot: Secondary | ICD-10-CM

## 2023-10-11 ENCOUNTER — Encounter: Payer: Self-pay | Admitting: Podiatry

## 2023-10-11 ENCOUNTER — Ambulatory Visit (INDEPENDENT_AMBULATORY_CARE_PROVIDER_SITE_OTHER): Payer: 59

## 2023-10-11 ENCOUNTER — Ambulatory Visit (INDEPENDENT_AMBULATORY_CARE_PROVIDER_SITE_OTHER): Payer: 59 | Admitting: Podiatry

## 2023-10-11 DIAGNOSIS — M2011 Hallux valgus (acquired), right foot: Secondary | ICD-10-CM | POA: Diagnosis not present

## 2023-10-11 DIAGNOSIS — Z9889 Other specified postprocedural states: Secondary | ICD-10-CM | POA: Diagnosis not present

## 2023-10-14 NOTE — Progress Notes (Signed)
Subjective:   Patient ID: Maria Murphy, female   DOB: 68 y.o.   MRN: [id number]   HPI Patient states she is doing very well she might of had some mild trauma to the big toe joint right after surgery   ROS      Objective:  Physical Exam  Neurovascular status intact negative Denna Haggard' sign noted wound edges coapted well hallux rectus position range of motion adequate currently     Assessment:  Doing well post osteotomy right first metatarsal     Plan:  H&P reviewed discussed and I at this time reviewed x-rays.  I reapplied sterile dressing continue complete immobilization reappoint to recheck in the next several weeks should heal uneventfully  X-rays do indicate there may have been slight stress on the osteotomy site but it appears to be stable and should not be a long-term issue

## 2023-10-25 ENCOUNTER — Encounter: Payer: 59 | Admitting: Podiatry

## 2023-11-04 ENCOUNTER — Encounter: Payer: 59 | Admitting: Podiatry

## 2023-11-08 ENCOUNTER — Ambulatory Visit (INDEPENDENT_AMBULATORY_CARE_PROVIDER_SITE_OTHER): Admitting: Podiatry

## 2023-11-08 ENCOUNTER — Ambulatory Visit (INDEPENDENT_AMBULATORY_CARE_PROVIDER_SITE_OTHER)

## 2023-11-08 DIAGNOSIS — M21611 Bunion of right foot: Secondary | ICD-10-CM

## 2023-11-08 DIAGNOSIS — M21619 Bunion of unspecified foot: Secondary | ICD-10-CM

## 2023-11-08 NOTE — Progress Notes (Unsigned)
 POV # 2 DOS 10/05/23 --- Maria Murphy WITH FIXATION RIGHT FOOT.Pt came into visit already wearing a regular shoe when she should have been in a surgical shoe per doctors's orders. Pt presents with swelling to foot denies pain. Instructed pt to keep foot elevated and wear compression anklet. Move Great to joint upward and downward as much as possible.xraY indicated good alignment with patient very happy with result and wearing surgical shoe earlier then she should but doing ok

## 2024-01-03 ENCOUNTER — Other Ambulatory Visit (HOSPITAL_COMMUNITY): Payer: Self-pay | Admitting: Family Medicine

## 2024-01-03 DIAGNOSIS — M810 Age-related osteoporosis without current pathological fracture: Secondary | ICD-10-CM

## 2024-03-01 ENCOUNTER — Emergency Department (HOSPITAL_COMMUNITY)

## 2024-03-01 ENCOUNTER — Encounter (HOSPITAL_COMMUNITY): Payer: Self-pay | Admitting: Hematology

## 2024-03-01 ENCOUNTER — Encounter (HOSPITAL_COMMUNITY): Payer: Self-pay | Admitting: Emergency Medicine

## 2024-03-01 ENCOUNTER — Emergency Department (HOSPITAL_COMMUNITY)
Admission: EM | Admit: 2024-03-01 | Discharge: 2024-03-02 | Disposition: A | Discharge status: Home / Self Care | Attending: Student | Admitting: Student

## 2024-03-01 DIAGNOSIS — R197 Diarrhea, unspecified: Secondary | ICD-10-CM | POA: Diagnosis not present

## 2024-03-01 DIAGNOSIS — I10 Essential (primary) hypertension: Secondary | ICD-10-CM | POA: Diagnosis not present

## 2024-03-01 DIAGNOSIS — Z85828 Personal history of other malignant neoplasm of skin: Secondary | ICD-10-CM | POA: Diagnosis not present

## 2024-03-01 DIAGNOSIS — Z79899 Other long term (current) drug therapy: Secondary | ICD-10-CM | POA: Insufficient documentation

## 2024-03-01 DIAGNOSIS — R1012 Left upper quadrant pain: Secondary | ICD-10-CM | POA: Insufficient documentation

## 2024-03-01 DIAGNOSIS — R935 Abnormal findings on diagnostic imaging of other abdominal regions, including retroperitoneum: Secondary | ICD-10-CM

## 2024-03-01 LAB — COMPREHENSIVE METABOLIC PANEL WITH GFR
ALT: 9 U/L (ref 0–44)
AST: 22 U/L (ref 15–41)
Albumin: 4.2 g/dL (ref 3.5–5.0)
Alkaline Phosphatase: 56 U/L (ref 38–126)
Anion gap: 13 (ref 5–15)
BUN: 21 mg/dL (ref 8–23)
CO2: 23 mmol/L (ref 22–32)
Calcium: 9.3 mg/dL (ref 8.9–10.3)
Chloride: 102 mmol/L (ref 98–111)
Creatinine, Ser: 0.82 mg/dL (ref 0.44–1.00)
GFR, Estimated: >60 mL/min (ref >60)
Glucose, Bld: 83 mg/dL (ref 70–99)
Potassium: 3.7 mmol/L (ref 3.5–5.1)
Sodium: 138 mmol/L (ref 135–145)
Total Bilirubin: 0.5 mg/dL (ref 0.0–1.2)
Total Protein: 7.3 g/dL (ref 6.5–8.1)

## 2024-03-01 LAB — CBC
HCT: 39.8 % (ref 36.0–46.0)
Hemoglobin: 13.3 g/dL (ref 12.0–15.0)
MCH: 30.6 pg (ref 26.0–34.0)
MCHC: 33.4 g/dL (ref 30.0–36.0)
MCV: 91.5 fL (ref 80.0–100.0)
Platelets: 188 K/uL (ref 150–400)
RBC: 4.35 MIL/uL (ref 3.87–5.11)
RDW: 14.2 % (ref 11.5–15.5)
WBC: 5.1 K/uL (ref 4.0–10.5)
nRBC: 0 % (ref 0.0–0.2)

## 2024-03-01 LAB — URINALYSIS, ROUTINE W REFLEX MICROSCOPIC
Bilirubin Urine: NEGATIVE
Glucose, UA: NEGATIVE mg/dL
Hgb urine dipstick: NEGATIVE
Ketones, ur: NEGATIVE mg/dL
Nitrite: NEGATIVE
Protein, ur: NEGATIVE mg/dL
Specific Gravity, Urine: 1.021 (ref 1.005–1.030)
pH: 5 (ref 5.0–8.0)

## 2024-03-01 LAB — TROPONIN I (HIGH SENSITIVITY)
Troponin I (High Sensitivity): 3 ng/L (ref <18)
Troponin I (High Sensitivity): 4 ng/L (ref <18)

## 2024-03-01 LAB — LIPASE, BLOOD: Lipase: 42 U/L (ref 11–51)

## 2024-03-01 LAB — D-DIMER, QUANTITATIVE: D-Dimer, Quant: 0.35 ug{FEU}/mL (ref 0.00–0.50)

## 2024-03-01 MED ORDER — ONDANSETRON 4 MG PO TBDP: 4.0000 mg | ORAL_TABLET | Freq: Once | ORAL | Status: DC | PRN

## 2024-03-01 MED ORDER — IOHEXOL 300 MG/ML  SOLN
100.0000 mL | Freq: Once | INTRAMUSCULAR | Status: AC | PRN
  Administered 2024-03-01: 100 mL via INTRAVENOUS

## 2024-03-01 MED ORDER — ONDANSETRON HCL 4 MG/2ML IJ SOLN
4.0000 mg | Freq: Once | INTRAMUSCULAR | Status: AC
  Administered 2024-03-01: 4 mg via INTRAVENOUS
  Filled 2024-03-01: qty 2

## 2024-03-01 MED ORDER — MORPHINE SULFATE (PF) 4 MG/ML IV SOLN
4.0000 mg | Freq: Once | INTRAVENOUS | Status: AC
  Administered 2024-03-01: 4 mg via INTRAVENOUS
  Filled 2024-03-01: qty 1

## 2024-03-01 NOTE — ED Triage Notes (Addendum)
 Pov c/o abdominal pain and RUQ pain that started 10xdays ago that has progressively gotten worse. Pt states pain radiates into the left side of ribs. C/o of belly and bilateral leg swelling. Pt ambulatory in triage. Endorses n/v/d/f. Pt states fever 2xdays ago 101F.  6 rounds of emesis last 24 hours. 12-15 rounds of diarrhea last 24 hours. Pt states is having blurred vision. EKG done in triage

## 2024-03-01 NOTE — ED Provider Notes (Signed)
 [place] EMERGENCY DEPARTMENT AT [facility] Provider Note   CSN: [id number] Arrival date & time: 03/01/24  2036     Patient presents with: Abdominal Pain   Maria Murphy is a 68 y.o. female.  {Add pertinent medical, surgical, social history, OB history to HPI:32947} HPI     This is a 68 year old female with history of IBS who presents with left upper quadrant pain.  Patient reports epigastric and left upper quadrant pain starting 10 days ago.  She had multiple episodes of nonbilious, nonbloody emesis and diarrhea.  She has a history of IBS so that is not necessarily abnormal for her.  She states that she has had a fever 2 days ago to 101 but thinks this may be related to a toe infection.  She states that her cat scratched her toe and she noted that it was red.  She used some topical home remedies and states that it looks much better.  She has not had any shortness of breath or chest pain.  Does have a history of a provoked PE after surgery and is no longer on anticoagulants.  Prior to Admission medications   Medication Sig Start Date End Date Taking? Authorizing Provider  acetaminophen  (TYLENOL ) 500 MG tablet Take 1 tablet (500 mg total) by mouth every 6 (six) hours as needed for mild pain or fever. 09/27/20   Johnson, Clanford L, MD  albuterol  (VENTOLIN  HFA) 108 (90 Base) MCG/ACT inhaler Inhale 1-2 puffs into the lungs every 6 (six) hours as needed for wheezing or shortness of breath. 07/03/22   Elnor Jayson LABOR, DO  amLODipine  (NORVASC ) 10 MG tablet Take 1 tablet (10 mg total) by mouth daily. For BP Patient taking differently: Take 10 mg by mouth as needed (elevated bp). For BP 10/29/21   Brien Belvie BRAVO, MD  Ascorbic Acid (VITAMIN C PO) Take 1 tablet by mouth daily.    [provider]  benzonatate  (TESSALON ) 100 MG capsule Take 1 capsule (100 mg total) by mouth every 8 (eight) hours. Patient taking differently: Take 100 mg by mouth as needed for cough. 07/03/22    Elnor Jayson LABOR, DO  carvedilol  (COREG ) 12.5 MG tablet Take 1 tablet (12.5 mg total) by mouth 2 (two) times daily with a meal. 10/29/21   Brien Belvie BRAVO, MD  citalopram  (CELEXA ) 40 MG tablet Take 1 tablet (40 mg total) by mouth at bedtime. 10/29/21   Brien Belvie BRAVO, MD  donepezil  (ARICEPT ) 10 MG tablet TAKE 1 TABLET BY MOUTH IN THE MORNING AND AT BEDTIME 02/17/22   Brien Belvie BRAVO, MD  DULoxetine  HCl 40 MG CPEP Take 1 capsule by mouth daily. 10/29/21   Brien Belvie BRAVO, MD  ferrous sulfate  325 (65 FE) MG tablet Take 325 mg by mouth in the morning. 02/27/21   [provider]  furosemide  (LASIX ) 20 MG tablet Take 20 mg by mouth as needed for fluid or edema. 08/09/22   [provider]  gabapentin  (NEURONTIN ) 300 MG capsule Take 1 capsule (300 mg total) by mouth 2 (two) times daily. Patient taking differently: Take 300 mg by mouth as needed (nerve pain). 10/29/21   Brien Belvie BRAVO, MD  lactose free nutrition (BOOST) LIQD Take 237 mLs by mouth 2 (two) times daily. Boost Breeze Clear    [provider]  lansoprazole  (PREVACID  SOLUTAB) 30 MG disintegrating tablet Place 1 tablet on tongue twice daily and allow to dissolve until particles can be swallowed. Do not swallow whole, break, cut, or  chew tablet. 10/29/21   Brien Belvie BRAVO, MD  lisinopril -hydrochlorothiazide  (ZESTORETIC ) 10-12.5 MG tablet Take 1 tablet by mouth daily. 12/15/21   Wyn Jackee VEAR Mickey., NP  loperamide  (IMODIUM ) 2 MG capsule Take 1 capsule (2 mg total) by mouth 2 (two) times daily as needed for diarrhea or loose stools. 03/11/21   Christopher Savannah, PA-C  meclizine (ANTIVERT) 25 MG tablet Take 25 mg by mouth as needed for dizziness. 11/27/22   [provider]  ondansetron  (ZOFRAN ) 4 MG tablet Take 1 tablet (4 mg total) by mouth every 8 (eight) hours as needed for nausea or vomiting. 10/04/23   Regal, Pasco RAMAN, DPM  promethazine  (PHENERGAN ) 12.5 MG tablet Take 1 tablet (12.5 mg total) by mouth every 8 (eight) hours as  needed for nausea or vomiting. 10/29/21   Brien Belvie BRAVO, MD  pseudoephedrine  (SUDAFED) 60 MG tablet Take 1 tablet (60 mg total) by mouth every 8 (eight) hours as needed for congestion. 07/03/22   Elnor Jayson LABOR, DO  QUEtiapine  (SEROQUEL ) 50 MG tablet Take 1 tablet (50 mg total) by mouth at bedtime. 10/29/21   Brien Belvie BRAVO, MD  RABEprazole  (ACIPHEX ) 20 MG tablet Take 1 tablet (20 mg total) by mouth 2 (two) times daily. 10/30/21   Mansouraty, Aloha Mickey., MD  sucralfate  (CARAFATE ) 1 GM/10ML suspension Take 10 mLs (1 g total) by mouth 4 (four) times daily -  with meals and at bedtime. 10/29/21   Brien Belvie BRAVO, MD  tiZANidine  (ZANAFLEX ) 4 MG tablet Take 1 tablet (4 mg total) by mouth every 8 (eight) hours as needed for muscle spasms. 10/29/21   Brien Belvie BRAVO, MD  traMADol  (ULTRAM ) 50 MG tablet Take 1 tablet (50 mg total) by mouth every 8 (eight) hours as needed. 10/29/21   Brien Belvie BRAVO, MD  traZODone  (DESYREL ) 100 MG tablet Take 1 tablet (100 mg total) by mouth at bedtime. 10/29/21   Brien Belvie BRAVO, MD  tretinoin  (RETIN-A ) 0.025 % cream Apply a pea sized amount to the entire face QHS. 01/14/22   Hester Alm BROCKS, MD    Allergies: Wheat extract    Review of Systems  Constitutional:  Positive for fever.  Respiratory:  Negative for shortness of breath.   Cardiovascular:  Negative for chest pain.  Gastrointestinal:  Positive for abdominal pain, diarrhea, nausea and vomiting.  Genitourinary:  Negative for dysuria.  Skin:  Positive for color change.  All other systems reviewed and are negative.   Updated Vital Signs BP (!) 151/97   Pulse (!) 55   Temp 97.8 F (36.6 C) (Oral)   Resp 20   SpO2 100%   Physical Exam Vitals and nursing note reviewed.  Constitutional:      Appearance: She is well-developed. She is not ill-appearing.  HENT:     Head: Normocephalic and atraumatic.  Eyes:     Pupils: Pupils are equal, round, and reactive to light.  Cardiovascular:     Rate and Rhythm:  Normal rate and regular rhythm.     Heart sounds: Normal heart sounds.  Pulmonary:     Effort: Pulmonary effort is normal. No respiratory distress.     Breath sounds: No wheezing.  Abdominal:     General: Bowel sounds are normal.     Palpations: Abdomen is soft.     Tenderness: There is abdominal tenderness in the epigastric area and left upper quadrant. There is no guarding or rebound. Negative signs include Murphy's sign.  Musculoskeletal:     Cervical  back: Neck supple.  Skin:    General: Skin is warm and dry.     Comments: Left great toe without evidence of fluctuance or induration, no significant erythema  Neurological:     General: No focal deficit present.     Mental Status: She is alert and oriented to person, place, and time.     (all labs ordered are listed, but only abnormal results are displayed) Labs Reviewed  URINALYSIS, ROUTINE W REFLEX MICROSCOPIC - Abnormal; Notable for the following components:      Result Value   Leukocytes,Ua MODERATE (*)    Bacteria, UA RARE (*)    All other components within normal limits  LIPASE, BLOOD  COMPREHENSIVE METABOLIC PANEL WITH GFR  CBC  D-DIMER, QUANTITATIVE  TROPONIN I (HIGH SENSITIVITY)  TROPONIN I (HIGH SENSITIVITY)    EKG: EKG Interpretation Date/Time:  Wednesday March 01 2024 21:41:14 EDT Ventricular Rate:  57 PR Interval:  153 QRS Duration:  94 QT Interval:  474 QTC Calculation: 462 R Axis:   -11  Text Interpretation: Sinus rhythm Minimal ST elevation, inferior leads Confirmed by Bari Pfeiffer ([id number]) on 03/01/2024 10:53:28 PM  Radiology: CT ABDOMEN PELVIS W CONTRAST Result Date: 03/01/2024 CLINICAL DATA:  Epigastric pain EXAM: CT ABDOMEN AND PELVIS WITH CONTRAST TECHNIQUE: Multidetector CT imaging of the abdomen and pelvis was performed using the standard protocol following bolus administration of intravenous contrast. RADIATION DOSE REDUCTION: This exam was performed according to the departmental  dose-optimization program which includes automated exposure control, adjustment of the mA and/or kV according to patient size and/or use of iterative reconstruction technique. CONTRAST:  OMNIPAQUE  IOHEXOL  300 MG/ML  SOLN COMPARISON:  CT 11/11/2022, MRI 05/16/2023, 12/09/2020 FINDINGS: Lower chest: Lung bases demonstrate no acute airspace disease. Small hiatal hernia. Hepatobiliary: Subcentimeter hypodensity in the right hepatic lobe too small to further characterize. Multiple gallstones. There appears to be interval diffuse wall thickening of the gallbladder with possible pericholecystic fluid. No biliary dilatation Pancreas: No acute inflammation. 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 Spleen: Normal in size without focal abnormality. Adrenals/Urinary Tract: Adrenal glands are normal. Kidneys show no hydronephrosis. Stable renal cysts including rim calcified exophytic lesion off the upper right kidney measuring 18 mm, no specific imaging follow-up is recommended. The bladder is normal. Stomach/Bowel: Stomach is nonenlarged. No dilated small bowel. No acute bowel wall thickening. Fluid within the colon Vascular/Lymphatic: Aortic atherosclerosis. No enlarged abdominal or pelvic lymph nodes. Reproductive: Lobulated uterus with multiple fibroids. No adnexal mass. Other: Negative for pelvic effusion or free air Musculoskeletal: No acute or suspicious osseous abnormality. IMPRESSION: 1. Cholelithiasis with suspicion of interval diffuse wall thickening of the gallbladder and possible pericholecystic fluid. Findings raise concern for acute cholecystitis. Suggest ultrasound correlate 2. Fluid within the colon consistent, possible diarrhea. 3. 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 Reference MRI exam from September 2024. 4. Uterine fibroids. 5. Aortic atherosclerosis. Aortic Atherosclerosis  (ICD10-I70.0). Electronically Signed   By: Luke Bun M.D.   On: 03/01/2024 22:45   DG Chest 2 View Result Date: 03/01/2024 CLINICAL DATA:  Left side chest pain EXAM: CHEST - 2 VIEW COMPARISON:  07/03/2022 FINDINGS: Heart and mediastinal contours are within normal limits. No focal opacities or effusions. No acute bony abnormality. IMPRESSION: No active cardiopulmonary disease. Electronically Signed   By: Franky Crease M.D.   On: 03/01/2024 21:21    {Document cardiac monitor, telemetry assessment procedure when appropriate:32947} Procedures  Medications Ordered in the ED  ondansetron  (ZOFRAN -ODT) disintegrating tablet 4 mg (has no administration in time range)  iohexol  (OMNIPAQUE ) 300 MG/ML solution 100 mL (100 mLs Intravenous Contrast Given 03/01/24 2222)      {Click here for ABCD2, HEART and other calculators REFRESH Note before signing:1}                              Medical Decision Making Amount and/or Complexity of Data Reviewed Labs: ordered. Radiology: ordered.  Risk Prescription drug management.   ***  {Document critical care time when appropriate  Document review of labs and clinical decision tools ie CHADS2VASC2, etc  Document your independent review of radiology images and any outside records  Document your discussion with family members, caretakers and with consultants  Document social determinants of health affecting pt's care  Document your decision making why or why not admission, treatments were needed:32947:::1}   Final diagnoses:  None    ED Discharge Orders     None

## 2024-03-02 MED ORDER — ONDANSETRON 4 MG PO TBDP: 4.0000 mg | ORAL_TABLET | Freq: Three times a day (TID) | ORAL | 0 refills | Status: AC | PRN

## 2024-03-02 MED ORDER — LOPERAMIDE HCL 2 MG PO CAPS: 2.0000 mg | ORAL_CAPSULE | Freq: Four times a day (QID) | ORAL | 0 refills | Status: AC | PRN

## 2024-03-02 NOTE — Discharge Instructions (Signed)
 Are seen today for abdominal pain.  Your workup including labs is largely reassuring.  You did have some changes on your gallbladder on your CT imaging but given location of pain and your normal labs, do not suspect the cause of your symptoms today of your gallbladder.  Follow-up for ultrasound.  Follow-up with Dr. Mavis as an outpatient.  Return for any new or worsening symptoms.

## 2024-03-06 ENCOUNTER — Ambulatory Visit (HOSPITAL_COMMUNITY)
Admission: RE | Admit: 2024-03-06 | Discharge: 2024-03-06 | Disposition: A | Discharge status: Home / Self Care | Source: Ambulatory Visit | Attending: Emergency Medicine | Admitting: Emergency Medicine

## 2024-03-06 DIAGNOSIS — R935 Abnormal findings on diagnostic imaging of other abdominal regions, including retroperitoneum: Secondary | ICD-10-CM | POA: Insufficient documentation

## 2024-03-13 ENCOUNTER — Ambulatory Visit (HOSPITAL_COMMUNITY)

## 2024-04-06 ENCOUNTER — Other Ambulatory Visit: Payer: Self-pay | Admitting: *Deleted

## 2024-04-20 ENCOUNTER — Ambulatory Visit: Admitting: General Surgery

## 2024-04-26 ENCOUNTER — Encounter: Payer: Self-pay | Admitting: Gastroenterology

## 2024-04-26 ENCOUNTER — Ambulatory Visit (INDEPENDENT_AMBULATORY_CARE_PROVIDER_SITE_OTHER): Admitting: Gastroenterology

## 2024-04-26 DIAGNOSIS — K315 Obstruction of duodenum: Secondary | ICD-10-CM | POA: Diagnosis not present

## 2024-04-26 DIAGNOSIS — R1010 Upper abdominal pain, unspecified: Secondary | ICD-10-CM

## 2024-04-26 DIAGNOSIS — K269 Duodenal ulcer, unspecified as acute or chronic, without hemorrhage or perforation: Secondary | ICD-10-CM

## 2024-04-26 DIAGNOSIS — K8020 Calculus of gallbladder without cholecystitis without obstruction: Secondary | ICD-10-CM | POA: Diagnosis not present

## 2024-04-26 DIAGNOSIS — R1011 Right upper quadrant pain: Secondary | ICD-10-CM

## 2024-04-26 NOTE — Progress Notes (Signed)
 GASTROENTEROLOGY OUTPATIENT CLINIC VISIT   Primary Care Provider Delores Talbot NOVAK, MD (Inactive) No address on file None   Patient Profile: Maria Murphy is a 68 y.o. 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  The patient presents to the [facility] for an evaluation and management of problem(s) noted below:  Problem List 1. Upper abdominal pain   2. RUQ pain   3. Duodenal ulcer   4. Duodenal stricture   5. Calculus of gallbladder without cholecystitis without obstruction    Discussed the use of AI scribe software for clinical note transcription with the patient, who gave verbal consent to proceed.  History of Present Illness Please see prior notes for full details of HPI.  Interval History Maria Murphy is a 68 year old female with history of duodenal ulcers and stricture and pancreatic cysts who presents with abdominal pain and gastrointestinal symptoms for follow-up.  She has been experiencing daily abdominal pain, vomiting, and diarrhea over the past year though this has been tolerable.  We have not seen her since 2023.  In July, she was seen in the ED for progressive symptoms leading to a CT scan indicated potential inflammation of the gallbladder.  She ultimately felt better and was discharged to have outpatient US  and for follow-up with General surgery for consideration of cholecystectomy.  An ultrasound performed five days later showed gallstones up to two centimeters in size and thickening of the gallbladder wall but not overt cholecystitis but if there was concerned to subsequently need  HIDA scan imaging.  She never received or heard of these results or need for follow-up with surgery which had been the plan.  She still takes sucralfate  (Carafate ) liquid three times a day and PPI therapy for acid suppression. Despite this treatment, she continues to experience gastrointestinal symptoms, including vomiting and diarrhea, and is uncertain if the ulcers have resolved.  During the review of symptoms, she reports right upper quadrant pain, vomiting, and diarrhea. No use of blood thinners. She feels slightly better today compared to previous episodes, although she still experiences some pain.  She is interested in getting better.  She knows she was supposed to follow-up in regard to prior ulcer to ensure healing and is willing to do that currently.   GI Review of Systems Positive as above  Negative for dysphagia, odynophagia, melena, hematochezia  Review of Systems General: Denies fevers/chills/weight loss unintentionally Cardiovascular: Denies chest pain Pulmonary: Denies shortness of breath Gastroenterological: See HPI Genitourinary: Denies darkened urine Hematological: Denies easy bruising/bleeding Dermatological: Denies jaundice Psychological: Mood is stable   Medications Current Outpatient Medications  Medication Sig Dispense Refill   acetaminophen  (TYLENOL ) 500 MG tablet Take 1 tablet (500 mg total) by mouth every 6 (six) hours as needed for mild pain or fever.     albuterol  (VENTOLIN  HFA) 108 (90 Base) MCG/ACT inhaler Inhale 1-2 puffs into the lungs every 6 (six) hours as needed for wheezing or shortness of breath. 1 each 0   amLODipine  (NORVASC ) 10 MG tablet Take 1 tablet (10 mg total) by mouth daily. For BP (Patient taking differently: Take 10 mg by mouth as needed (elevated bp). For BP) 90 tablet 3   Ascorbic Acid (VITAMIN C PO) Take 1 tablet  by mouth daily.     benzonatate  (TESSALON ) 100 MG capsule Take 1 capsule (100 mg total) by mouth every 8 (eight) hours. (Patient  taking differently: Take 100 mg by mouth as needed for cough.) 21 capsule 0   carvedilol  (COREG ) 12.5 MG tablet Take 1 tablet (12.5 mg total) by mouth 2 (two) times daily with a meal. 60 tablet 3   citalopram  (CELEXA ) 40 MG tablet Take 1 tablet (40 mg total) by mouth at bedtime. 90 tablet 2   donepezil  (ARICEPT ) 10 MG tablet TAKE 1 TABLET BY MOUTH IN THE MORNING AND AT BEDTIME 180 tablet 0   DULoxetine  HCl 40 MG CPEP Take 1 capsule by mouth daily. 60 capsule 4   furosemide  (LASIX ) 20 MG tablet Take 20 mg by mouth as needed for fluid or edema.     gabapentin  (NEURONTIN ) 300 MG capsule Take 1 capsule (300 mg total) by mouth 2 (two) times daily. (Patient taking differently: Take 300 mg by mouth as needed (nerve pain).) 60 capsule 6   lactose free nutrition (BOOST) LIQD Take 237 mLs by mouth 2 (two) times daily. Boost Breeze Clear     lansoprazole  (PREVACID  SOLUTAB) 30 MG disintegrating tablet Place 1 tablet on tongue twice daily and allow to dissolve until particles can be swallowed. Do not swallow whole, break, cut, or chew tablet. 60 tablet 3   lisinopril -hydrochlorothiazide  (ZESTORETIC ) 10-12.5 MG tablet Take 1 tablet by mouth daily. 90 tablet 1   loperamide  (IMODIUM ) 2 MG capsule Take 1 capsule (2 mg total) by mouth 4 (four) times daily as needed for diarrhea or loose stools. 12 capsule 0   meclizine (ANTIVERT) 25 MG tablet Take 25 mg by mouth as needed for dizziness.     ondansetron  (ZOFRAN ) 4 MG tablet Take 1 tablet (4 mg total) by mouth every 8 (eight) hours as needed for nausea or vomiting. 20 tablet 0   ondansetron  (ZOFRAN -ODT) 4 MG disintegrating tablet Take 1 tablet (4 mg total) by mouth every 8 (eight) hours as needed for nausea or vomiting. 20 tablet 0   promethazine  (PHENERGAN ) 12.5 MG tablet Take 1 tablet (12.5 mg total) by mouth every 8 (eight) hours as needed for nausea or vomiting. 20 tablet 0   pseudoephedrine  (SUDAFED) 60 MG tablet Take 1 tablet (60 mg total) by mouth every 8 (eight)  hours as needed for congestion. 30 tablet 0   RABEprazole  (ACIPHEX ) 20 MG tablet Take 1 tablet (20 mg total) by mouth 2 (two) times daily. 60 tablet 6   sucralfate  (CARAFATE ) 1 GM/10ML suspension Take 10 mLs (1 g total) by mouth 4 (four) times daily -  with meals and at bedtime. 420 mL 2   tiZANidine  (ZANAFLEX ) 4 MG tablet Take 1 tablet (4 mg total) by mouth every 8 (eight) hours as needed for muscle spasms. 30 tablet 0   traMADol  (ULTRAM ) 50 MG tablet Take 1 tablet (50 mg total) by mouth every 8 (eight) hours as needed. 60 tablet 0   traZODone  (DESYREL ) 100 MG tablet Take 1 tablet (100 mg total) by mouth at bedtime. 30 tablet 1   tretinoin  (RETIN-A ) 0.025 % cream Apply a pea sized amount to the entire face QHS. 45 g 3   No current facility-administered medications for this visit.    Allergies Allergies  Allergen Reactions   Wheat Extract Swelling    Histories Past Medical History:  Diagnosis Date   Acute saddle pulmonary embolism without acute cor pulmonale (HCC)    Basal cell carcinoma  01/16/2021   L nose, MOHs 04/14/21   Collagen vascular disease (HCC)    Duodenal ulcer    Essential hypertension, benign 01/24/2015   Family history of ovarian cancer 01/06/2021   Family history of pancreatic cancer 01/06/2021   Family history of uterine cancer 01/06/2021   Foot ulcer (HCC)    GERD (gastroesophageal reflux disease)    Hypertension    Memory loss    Peptic ulcer with perforation (HCC)    Perforated abdominal viscus 04/22/2020   PUD (peptic ulcer disease)    with perforation s/p exploratory laparotomy   Pulmonary embolus (HCC) 04/2020   Skin cancer    Suicidal ideations 08/07/2020   Vision abnormalities    Past Surgical History:  Procedure Laterality Date   BILIARY DILATION  08/21/2021   Procedure: DUODENAL DILATION;  Surgeon: Wilhelmenia Aloha Raddle., MD;  Location: [facility] ENDOSCOPY;  Service: Gastroenterology;;   BIOPSY  11/20/2020   Procedure: BIOPSY;  Surgeon: Wilhelmenia Aloha Raddle., MD;  Location: [facility];  Service: Gastroenterology;;   BIOPSY  08/21/2021   Procedure: BIOPSY;  Surgeon: Wilhelmenia Aloha Raddle., MD;  Location: [facility] ENDOSCOPY;  Service: Gastroenterology;;   CENTRAL LINE INSERTION Right 04/22/2020   Procedure: CENTRAL LINE INSERTION;  Surgeon: Kallie Manuelita BROCKS, MD;  Location: [facility];  Service: General;  Laterality: Right;   DIAGNOSTIC LAPAROSCOPY     ESOPHAGOGASTRODUODENOSCOPY N/A 11/20/2020   Procedure: ESOPHAGOGASTRODUODENOSCOPY (EGD);  Surgeon: Wilhelmenia Aloha Raddle., MD;  Location: [facility];  Service: Gastroenterology;  Laterality: N/A;   ESOPHAGOGASTRODUODENOSCOPY (EGD) WITH PROPOFOL  N/A 07/10/2020   Procedure: ESOPHAGOGASTRODUODENOSCOPY (EGD) WITH PROPOFOL ;  Surgeon: Golda Claudis PENNER, MD;  Location: [facility];  Service: Endoscopy;  Laterality: N/A;   ESOPHAGOGASTRODUODENOSCOPY (EGD) WITH PROPOFOL  N/A 10/18/2020   Procedure: ESOPHAGOGASTRODUODENOSCOPY (EGD) WITH PROPOFOL ;  Surgeon: Eartha Angelia Sieving, MD;  Location: [facility];  Service: Gastroenterology;  Laterality: N/A;   ESOPHAGOGASTRODUODENOSCOPY (EGD) WITH PROPOFOL  N/A 08/21/2021   Procedure: ESOPHAGOGASTRODUODENOSCOPY (EGD) WITH PROPOFOL ;  Surgeon: Wilhelmenia Aloha Raddle., MD;  Location: [facility] ENDOSCOPY;  Service: Gastroenterology;  Laterality: N/A;   EUS N/A 11/20/2020   Procedure: UPPER ENDOSCOPIC ULTRASOUND (EUS) LINEAR;  Surgeon: Wilhelmenia Aloha Raddle., MD;  Location: [facility] ENDOSCOPY;  Service: Gastroenterology;  Laterality: N/A;   EUS  08/21/2021   Procedure: UPPER ENDOSCOPIC ULTRASOUND (EUS) LINEAR;  Surgeon: Wilhelmenia Aloha Raddle., MD;  Location: [facility] ENDOSCOPY;  Service: Gastroenterology;;   FINE NEEDLE ASPIRATION  08/21/2021   Procedure: FINE NEEDLE ASPIRATION (FNA) LINEAR;  Surgeon: Wilhelmenia Aloha Raddle., MD;  Location: [facility] ENDOSCOPY;  Service: Gastroenterology;;   ELIGIO  04/22/2020   Procedure: ELIGIO;  Surgeon: Kallie Manuelita BROCKS, MD;  Location:  [facility];  Service: General;;   IR CATHETER TUBE CHANGE  05/09/2020   LAPAROTOMY N/A 04/22/2020   Procedure: EXPLORATORY LAPAROTOMY;  Surgeon: Kallie Manuelita BROCKS, MD;  Location: [facility]S;  Service: General;  Laterality: N/A;   TONSILLECTOMY     Social History   Socioeconomic History   Marital status: Widowed    Spouse name: Not on file   Number of children: 2   Years of education: Not on file   Highest education level: Not on file  Occupational History   Not on file  Tobacco Use   Smoking status: Never   Smokeless tobacco: Never  Vaping Use   Vaping status: Never Used  Substance and Sexual Activity   Alcohol use: Yes    Alcohol/week: 2.0 - 3.0 standard drinks of alcohol    Types: 2 - 3  Glasses of wine per week    Comment: occ   Drug use: No   Sexual activity: Not Currently    Birth control/protection: Post-menopausal  Other Topics Concern   Not on file  Social History Narrative   Not on file   Social Drivers of Health   Financial Resource Strain: Low Risk  (09/17/2021)   Overall Financial Resource Strain (CARDIA)    Difficulty of Paying Living Expenses: Not hard at all  Food Insecurity: No Food Insecurity (09/17/2021)   Hunger Vital Sign    Worried About Running Out of Food in the Last Year: Never true    Ran Out of Food in the Last Year: Never true  Transportation Needs: Unmet Transportation Needs (09/17/2021)   PRAPARE - Administrator, Civil Service (Medical): Yes    Lack of Transportation (Non-Medical): No  Physical Activity: Insufficiently Active (09/17/2021)   Exercise Vital Sign    Days of Exercise per Week: 7 days    Minutes of Exercise per Session: 10 min  Stress: Stress Concern Present (09/17/2021)   [employer] of Occupational Health - Occupational Stress Questionnaire    Feeling of Stress : Very much  Social Connections: Unknown (01/06/2022)   Received from [facility]   Social Network    Social Network: Not on file  Intimate Partner  Violence: Unknown (11/28/2021)   Received from [facility]   HITS    Physically Hurt: Not on file    Insult or Talk Down To: Not on file    Threaten Physical Harm: Not on file    Scream or Curse: Not on file   Family History  Problem Relation Age of Onset   High Cholesterol Mother    Hypertension Mother    Arthritis/Rheumatoid Mother    Pancreatic cancer Mother 95   Alcohol abuse Father    Hypertension Maternal Grandmother    Uterine cancer Maternal Grandmother 95   Ovarian cancer Cousin        before age 51   Colon cancer Neg Hx    Colon polyps Neg Hx    Inflammatory bowel disease Neg Hx    Esophageal cancer Neg Hx    Liver disease Neg Hx    Rectal cancer Neg Hx    Stomach cancer Neg Hx    I have reviewed her medical, social, and family history in detail and updated the electronic medical record as necessary.    PHYSICAL EXAMINATION  BP 100/80   Pulse 66   Ht 5' 4 (1.626 m)   Wt 156 lb 9.6 oz (71 kg)   BMI 26.88 kg/m  Wt Readings from Last 3 Encounters:  04/26/24 156 lb 9.6 oz (71 kg)  01/11/23 147 lb (66.7 kg)  11/11/22 185 lb (83.9 kg)  GEN: NAD, appears stated age, doesn't appear chronically ill PSYCH: Cooperative, without pressured speech EYE: Conjunctivae pink, sclerae anicteric ENT: MMM CV: Nontachycardic RESP: No audible wheezing GI: NABS, soft, protuberant abdomen, rounded, mild TTP in MEG, no rebound, no guarding MSK/EXT: Trace bilateral lower extremity edema SKIN: No jaundice NEURO:  Alert & Oriented x 3, no focal deficits   REVIEW OF DATA  I reviewed the following data at the time of this encounter:  GI Procedures and Studies  Today we reviewed her last endoscopy March 2023 EGD - No gross lesions in esophagus proximally. LA Grade A esophagitis with no bleeding distally. - Z-line irregular, 33 cm from the incisors. - 5 cm hiatal hernia. - Gastritis  with hemorrhage. Biopsied. - 2 non-bleeding gastric ulcers with a clean ulcer base (Forrest  Class III). Leads to an acquired deformity in the gastric antrum as well.  - Non-bleeding duodenal ulcer with a clean ulcer base (Forrest Class III) - this is worse than prior since her prior EGD showed ulcer was healing. - Duodenal deformity distal to the ulcer in previous stenosis region. Easily traversed. Wire guided-CRE dilation can be considered again, long 1.5 cm AXIOS may be possible though not absolute and with active inflammation proximal to this may be higher risk. - No gross lesions in the second portion of the duodenum.  Laboratory Studies  Reviewed those in epic  Imaging Studies  July 2025 CTAP IMPRESSION: 1. Cholelithiasis with suspicion of interval diffuse wall thickening of the gallbladder and possible pericholecystic fluid. Findings raise concern for acute cholecystitis. Suggest ultrasound correlate 2. Fluid within the colon consistent, possible diarrhea. 3. 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 Reference MRI exam from September 2024. 4. Uterine fibroids. 5. Aortic atherosclerosis.  July 2025 ultrasound IMPRESSION: Cholelithiasis with mild gallbladder mural thickening of the underdistended gallbladder. No sonographic Murphy sign noted by sonographer. Findings are equivocal for acute cholecystitis. If there is clinical concern for acute cholecystitis, consider further evaluation with HIDA scan.   ASSESSMENT/PLAN  Ms. Tobia is a 68 y.o. female.  The patient is seen today for evaluation and management of:  1. Upper abdominal pain   2. RUQ pain   3. Duodenal ulcer   4. Duodenal stricture   5. Calculus of gallbladder without cholecystitis without obstruction    Abdominal pain Acute on Chronic nature. History of significant PUD of the duodenum.  Suspect that her gallbladder disease /cholelithiasis could be playing some role of things as well.  I would like to reevaluate the mural thickening on recent ultrasound, but I would  also like to rule out persisting PUD.  I do believe there is some functional pain overlay, but she has had significant true disease.   - Order repeat ultrasound to reassess gallbladder - If ultrasound findings are similar, order HIDA scan to evaluate gallbladder function - If ultrasound is definitive or very concerning, then will certainly need follow-up with surgery for cholecystectomy discussions - Schedule EGD to evaluate current status of ulcers - Consider switching acid suppression medication if significant ulcers are present to PCAB - If stricturing present may require dilation  Stable pancreatic cyst - Follow-up MRI/MRCP in 2026     The risks and benefits of endoscopic evaluation were discussed with the patient; these include but are not limited to the risk of perforation, infection, bleeding, missed lesions, lack of diagnosis, severe illness requiring hospitalization, as well as anesthesia and sedation related illnesses.  The patient and/or family is agreeable to proceed.   Repeat colonoscopy in 2026   Orders Placed This Encounter  Procedures   US  Abdomen Limited RUQ (LIVER/GB)   Ambulatory referral to Gastroenterology    New Prescriptions   No medications on file   Modified Medications   No medications on file    Planned Follow Up No follow-ups on file.   Total Time in Face-to-Face and in Coordination of Care for patient including independent/personal interpretation/review of prior testing, medical history, examination, medication adjustment, communicating results with the patient directly, and documentation within the EHR is 25 minutes.   Aloha Finner, MD [facility]oscopy Office # [phone]

## 2024-04-26 NOTE — Patient Instructions (Signed)
 You have been scheduled for an abdominal ultrasound at [facility] Radiology (1st floor of hospital) on 05/05/24 at 10:00 am . Please arrive 30 minutes prior to your appointment for registration. Make certain not to have anything to eat or drink 6 hours prior to your appointment. Should you need to reschedule your appointment, please contact radiology at [phone]. This test typically takes about 30 minutes to perform.  You have been scheduled for an endoscopy. Please follow written instructions given to you at your visit today.  If you use inhalers (even only as needed), please bring them with you on the day of your procedure.  If you take any of the following medications, they will need to be adjusted prior to your procedure:   DO NOT TAKE 7 DAYS PRIOR TO TEST- Trulicity (dulaglutide) Ozempic, Wegovy (semaglutide) Mounjaro (tirzepatide) Bydureon Bcise (exanatide extended release)  DO NOT TAKE 1 DAY PRIOR TO YOUR TEST Rybelsus (semaglutide) Adlyxin (lixisenatide) Victoza (liraglutide) Byetta (exanatide) ___________________________________________________________________________   Due to recent changes in healthcare laws, you may see the results of your imaging and laboratory studies on MyChart before your provider has had a chance to review them.  We understand that in some cases there may be results that are confusing or concerning to you. Not all laboratory results come back in the same time frame and the provider may be waiting for multiple results in order to interpret others.  Please give us  48 hours in order for your provider to thoroughly review all the results before contacting the office for clarification of your results.   Thank you for choosing me and [facility].  Dr. Wilhelmenia

## 2024-04-27 ENCOUNTER — Telehealth: Payer: Self-pay | Admitting: Gastroenterology

## 2024-04-27 NOTE — Telephone Encounter (Signed)
 Good morning Dr. Wilhelmenia,  This patient called and stated that she was needing to reschedule her EGD that was scheduled with you on September the 5 th due to her having a scheduling conflict. This patient was rescheduled for October the 1 st at 3 PM. Please advise.   Thank you.

## 2024-04-27 NOTE — Telephone Encounter (Signed)
Thank you for update. GM 

## 2024-04-28 ENCOUNTER — Encounter: Admitting: Gastroenterology

## 2024-04-29 ENCOUNTER — Encounter: Payer: Self-pay | Admitting: Gastroenterology

## 2024-04-29 DIAGNOSIS — R1010 Upper abdominal pain, unspecified: Secondary | ICD-10-CM | POA: Insufficient documentation

## 2024-05-05 ENCOUNTER — Ambulatory Visit (HOSPITAL_COMMUNITY)
Admission: RE | Admit: 2024-05-05 | Discharge: 2024-05-05 | Disposition: A | Discharge status: Home / Self Care | Source: Ambulatory Visit | Attending: Gastroenterology | Admitting: Gastroenterology

## 2024-05-05 DIAGNOSIS — K315 Obstruction of duodenum: Secondary | ICD-10-CM | POA: Insufficient documentation

## 2024-05-05 DIAGNOSIS — R1011 Right upper quadrant pain: Secondary | ICD-10-CM | POA: Diagnosis present

## 2024-05-05 DIAGNOSIS — K269 Duodenal ulcer, unspecified as acute or chronic, without hemorrhage or perforation: Secondary | ICD-10-CM | POA: Insufficient documentation

## 2024-05-05 DIAGNOSIS — R1010 Upper abdominal pain, unspecified: Secondary | ICD-10-CM | POA: Diagnosis present

## 2024-05-12 ENCOUNTER — Ambulatory Visit: Payer: Self-pay | Admitting: Gastroenterology

## 2024-05-24 ENCOUNTER — Encounter: Payer: Self-pay | Admitting: Gastroenterology

## 2024-05-24 ENCOUNTER — Ambulatory Visit (AMBULATORY_SURGERY_CENTER): Admitting: Gastroenterology

## 2024-05-24 VITALS — BP 136/72 | HR 53 | Temp 97.2°F | Resp 19 | Ht 64.0 in | Wt 156.0 lb

## 2024-05-24 DIAGNOSIS — K3189 Other diseases of stomach and duodenum: Secondary | ICD-10-CM

## 2024-05-24 DIAGNOSIS — R1010 Upper abdominal pain, unspecified: Secondary | ICD-10-CM

## 2024-05-24 DIAGNOSIS — K2950 Unspecified chronic gastritis without bleeding: Secondary | ICD-10-CM

## 2024-05-24 DIAGNOSIS — K5710 Diverticulosis of small intestine without perforation or abscess without bleeding: Secondary | ICD-10-CM

## 2024-05-24 DIAGNOSIS — K315 Obstruction of duodenum: Secondary | ICD-10-CM | POA: Diagnosis not present

## 2024-05-24 DIAGNOSIS — K2289 Other specified disease of esophagus: Secondary | ICD-10-CM | POA: Diagnosis not present

## 2024-05-24 DIAGNOSIS — K208 Other esophagitis without bleeding: Secondary | ICD-10-CM | POA: Diagnosis not present

## 2024-05-24 DIAGNOSIS — K2970 Gastritis, unspecified, without bleeding: Secondary | ICD-10-CM

## 2024-05-24 DIAGNOSIS — K449 Diaphragmatic hernia without obstruction or gangrene: Secondary | ICD-10-CM

## 2024-05-24 MED ORDER — VOQUEZNA 20 MG PO TABS: 20.0000 mg | ORAL_TABLET | Freq: Every day | ORAL | 6 refills | Status: AC

## 2024-05-24 MED ORDER — SODIUM CHLORIDE 0.9 % IV SOLN: 500.0000 mL | INTRAVENOUS | Status: AC

## 2024-05-24 NOTE — Progress Notes (Signed)
 GASTROENTEROLOGY PROCEDURE H&P NOTE   Primary Care Physician: Shona Norleen PEDLAR, MD  HPI: Maria Murphy is a 68 y.o. female who presents for EGD for evaluation of abdominal pain and prior history of duodenal stricturing.  Past Medical History:  Diagnosis Date   Acute saddle pulmonary embolism without acute cor pulmonale (HCC)    Basal cell carcinoma 01/16/2021   L nose, MOHs 04/14/21   Collagen vascular disease    Duodenal ulcer    Essential hypertension, benign 01/24/2015   Family history of ovarian cancer 01/06/2021   Family history of pancreatic cancer 01/06/2021   Family history of uterine cancer 01/06/2021   Foot ulcer (HCC)    GERD (gastroesophageal reflux disease)    Hypertension    Memory loss    Peptic ulcer with perforation (HCC)    Perforated abdominal viscus 04/22/2020   PUD (peptic ulcer disease)    with perforation s/p exploratory laparotomy   Pulmonary embolus (HCC) 04/2020   Skin cancer    Suicidal ideations 08/07/2020   Vision abnormalities    Past Surgical History:  Procedure Laterality Date   BILIARY DILATION  08/21/2021   Procedure: DUODENAL DILATION;  Surgeon: Wilhelmenia Aloha Raddle., MD;  Location: [facility] ENDOSCOPY;  Service: Gastroenterology;;   BIOPSY  11/20/2020   Procedure: BIOPSY;  Surgeon: Wilhelmenia Aloha Raddle., MD;  Location: [facility];  Service: Gastroenterology;;   BIOPSY  08/21/2021   Procedure: BIOPSY;  Surgeon: Wilhelmenia Aloha Raddle., MD;  Location: [facility] ENDOSCOPY;  Service: Gastroenterology;;   CENTRAL LINE INSERTION Right 04/22/2020   Procedure: CENTRAL LINE INSERTION;  Surgeon: Kallie Manuelita BROCKS, MD;  Location: [facility] ORS;  Service: General;  Laterality: Right;   DIAGNOSTIC LAPAROSCOPY     ESOPHAGOGASTRODUODENOSCOPY N/A 11/20/2020   Procedure: ESOPHAGOGASTRODUODENOSCOPY (EGD);  Surgeon: Wilhelmenia Aloha Raddle., MD;  Location: [facility];  Service: Gastroenterology;  Laterality: N/A;   ESOPHAGOGASTRODUODENOSCOPY (EGD) WITH PROPOFOL  N/A  07/10/2020   Procedure: ESOPHAGOGASTRODUODENOSCOPY (EGD) WITH PROPOFOL ;  Surgeon: Golda Claudis PENNER, MD;  Location: [facility];  Service: Endoscopy;  Laterality: N/A;   ESOPHAGOGASTRODUODENOSCOPY (EGD) WITH PROPOFOL  N/A 10/18/2020   Procedure: ESOPHAGOGASTRODUODENOSCOPY (EGD) WITH PROPOFOL ;  Surgeon: Eartha Angelia Sieving, MD;  Location: [facility];  Service: Gastroenterology;  Laterality: N/A;   ESOPHAGOGASTRODUODENOSCOPY (EGD) WITH PROPOFOL  N/A 08/21/2021   Procedure: ESOPHAGOGASTRODUODENOSCOPY (EGD) WITH PROPOFOL ;  Surgeon: Wilhelmenia Aloha Raddle., MD;  Location: [facility] ENDOSCOPY;  Service: Gastroenterology;  Laterality: N/A;   EUS N/A 11/20/2020   Procedure: UPPER ENDOSCOPIC ULTRASOUND (EUS) LINEAR;  Surgeon: Wilhelmenia Aloha Raddle., MD;  Location: [facility] ENDOSCOPY;  Service: Gastroenterology;  Laterality: N/A;   EUS  08/21/2021   Procedure: UPPER ENDOSCOPIC ULTRASOUND (EUS) LINEAR;  Surgeon: Wilhelmenia Aloha Raddle., MD;  Location: [facility] ENDOSCOPY;  Service: Gastroenterology;;   FINE NEEDLE ASPIRATION  08/21/2021   Procedure: FINE NEEDLE ASPIRATION (FNA) LINEAR;  Surgeon: Wilhelmenia Aloha Raddle., MD;  Location: [facility] ENDOSCOPY;  Service: Gastroenterology;;   ELIGIO  04/22/2020   Procedure: ELIGIO;  Surgeon: Kallie Manuelita BROCKS, MD;  Location: [facility];  Service: General;;   IR CATHETER TUBE CHANGE  05/09/2020   LAPAROTOMY N/A 04/22/2020   Procedure: EXPLORATORY LAPAROTOMY;  Surgeon: Kallie Manuelita BROCKS, MD;  Location: [facility];  Service: General;  Laterality: N/A;   TONSILLECTOMY     Current Outpatient Medications  Medication Sig Dispense Refill   acetaminophen  (TYLENOL ) 500 MG tablet Take 1 tablet (500 mg total) by mouth every 6 (six) hours as needed for mild pain or fever.     albuterol  (  VENTOLIN  HFA) 108 (90 Base) MCG/ACT inhaler Inhale 1-2 puffs into the lungs every 6 (six) hours as needed for wheezing or shortness of breath. 1 each 0   amLODipine  (NORVASC ) 10 MG tablet Take 1 tablet (10  mg total) by mouth daily. For BP (Patient taking differently: Take 10 mg by mouth as needed (elevated bp). For BP) 90 tablet 3   Ascorbic Acid (VITAMIN C PO) Take 1 tablet by mouth daily.     benzonatate  (TESSALON ) 100 MG capsule Take 1 capsule (100 mg total) by mouth every 8 (eight) hours. (Patient taking differently: Take 100 mg by mouth as needed for cough.) 21 capsule 0   carvedilol  (COREG ) 12.5 MG tablet Take 1 tablet (12.5 mg total) by mouth 2 (two) times daily with a meal. 60 tablet 3   citalopram  (CELEXA ) 40 MG tablet Take 1 tablet (40 mg total) by mouth at bedtime. 90 tablet 2   donepezil  (ARICEPT ) 10 MG tablet TAKE 1 TABLET BY MOUTH IN THE MORNING AND AT BEDTIME 180 tablet 0   DULoxetine  HCl 40 MG CPEP Take 1 capsule by mouth daily. 60 capsule 4   furosemide  (LASIX ) 20 MG tablet Take 20 mg by mouth as needed for fluid or edema.     gabapentin  (NEURONTIN ) 300 MG capsule Take 1 capsule (300 mg total) by mouth 2 (two) times daily. (Patient taking differently: Take 300 mg by mouth as needed (nerve pain).) 60 capsule 6   lactose free nutrition (BOOST) LIQD Take 237 mLs by mouth 2 (two) times daily. Boost Breeze Clear     lansoprazole  (PREVACID  SOLUTAB) 30 MG disintegrating tablet Place 1 tablet on tongue twice daily and allow to dissolve until particles can be swallowed. Do not swallow whole, break, cut, or chew tablet. 60 tablet 3   lisinopril -hydrochlorothiazide  (ZESTORETIC ) 10-12.5 MG tablet Take 1 tablet by mouth daily. 90 tablet 1   loperamide  (IMODIUM ) 2 MG capsule Take 1 capsule (2 mg total) by mouth 4 (four) times daily as needed for diarrhea or loose stools. 12 capsule 0   meclizine (ANTIVERT) 25 MG tablet Take 25 mg by mouth as needed for dizziness.     ondansetron  (ZOFRAN ) 4 MG tablet Take 1 tablet (4 mg total) by mouth every 8 (eight) hours as needed for nausea or vomiting. 20 tablet 0   ondansetron  (ZOFRAN -ODT) 4 MG disintegrating tablet Take 1 tablet (4 mg total) by mouth every 8  (eight) hours as needed for nausea or vomiting. 20 tablet 0   promethazine  (PHENERGAN ) 12.5 MG tablet Take 1 tablet (12.5 mg total) by mouth every 8 (eight) hours as needed for nausea or vomiting. 20 tablet 0   pseudoephedrine  (SUDAFED) 60 MG tablet Take 1 tablet (60 mg total) by mouth every 8 (eight) hours as needed for congestion. 30 tablet 0   RABEprazole  (ACIPHEX ) 20 MG tablet Take 1 tablet (20 mg total) by mouth 2 (two) times daily. 60 tablet 6   sucralfate  (CARAFATE ) 1 GM/10ML suspension Take 10 mLs (1 g total) by mouth 4 (four) times daily -  with meals and at bedtime. 420 mL 2   tiZANidine  (ZANAFLEX ) 4 MG tablet Take 1 tablet (4 mg total) by mouth every 8 (eight) hours as needed for muscle spasms. 30 tablet 0   traMADol  (ULTRAM ) 50 MG tablet Take 1 tablet (50 mg total) by mouth every 8 (eight) hours as needed. 60 tablet 0   traZODone  (DESYREL ) 100 MG tablet Take 1 tablet (100 mg total) by mouth at  bedtime. 30 tablet 1   tretinoin  (RETIN-A ) 0.025 % cream Apply a pea sized amount to the entire face QHS. 45 g 3   No current facility-administered medications for this visit.    Current Outpatient Medications:    acetaminophen  (TYLENOL ) 500 MG tablet, Take 1 tablet (500 mg total) by mouth every 6 (six) hours as needed for mild pain or fever., Disp: , Rfl:    albuterol  (VENTOLIN  HFA) 108 (90 Base) MCG/ACT inhaler, Inhale 1-2 puffs into the lungs every 6 (six) hours as needed for wheezing or shortness of breath., Disp: 1 each, Rfl: 0   amLODipine  (NORVASC ) 10 MG tablet, Take 1 tablet (10 mg total) by mouth daily. For BP (Patient taking differently: Take 10 mg by mouth as needed (elevated bp). For BP), Disp: 90 tablet, Rfl: 3   Ascorbic Acid (VITAMIN C PO), Take 1 tablet by mouth daily., Disp: , Rfl:    benzonatate  (TESSALON ) 100 MG capsule, Take 1 capsule (100 mg total) by mouth every 8 (eight) hours. (Patient taking differently: Take 100 mg by mouth as needed for cough.), Disp: 21 capsule, Rfl:  0   carvedilol  (COREG ) 12.5 MG tablet, Take 1 tablet (12.5 mg total) by mouth 2 (two) times daily with a meal., Disp: 60 tablet, Rfl: 3   citalopram  (CELEXA ) 40 MG tablet, Take 1 tablet (40 mg total) by mouth at bedtime., Disp: 90 tablet, Rfl: 2   donepezil  (ARICEPT ) 10 MG tablet, TAKE 1 TABLET BY MOUTH IN THE MORNING AND AT BEDTIME, Disp: 180 tablet, Rfl: 0   DULoxetine  HCl 40 MG CPEP, Take 1 capsule by mouth daily., Disp: 60 capsule, Rfl: 4   furosemide  (LASIX ) 20 MG tablet, Take 20 mg by mouth as needed for fluid or edema., Disp: , Rfl:    gabapentin  (NEURONTIN ) 300 MG capsule, Take 1 capsule (300 mg total) by mouth 2 (two) times daily. (Patient taking differently: Take 300 mg by mouth as needed (nerve pain).), Disp: 60 capsule, Rfl: 6   lactose free nutrition (BOOST) LIQD, Take 237 mLs by mouth 2 (two) times daily. Boost Breeze Clear, Disp: , Rfl:    lansoprazole  (PREVACID  SOLUTAB) 30 MG disintegrating tablet, Place 1 tablet on tongue twice daily and allow to dissolve until particles can be swallowed. Do not swallow whole, break, cut, or chew tablet., Disp: 60 tablet, Rfl: 3   lisinopril -hydrochlorothiazide  (ZESTORETIC ) 10-12.5 MG tablet, Take 1 tablet by mouth daily., Disp: 90 tablet, Rfl: 1   loperamide  (IMODIUM ) 2 MG capsule, Take 1 capsule (2 mg total) by mouth 4 (four) times daily as needed for diarrhea or loose stools., Disp: 12 capsule, Rfl: 0   meclizine (ANTIVERT) 25 MG tablet, Take 25 mg by mouth as needed for dizziness., Disp: , Rfl:    ondansetron  (ZOFRAN ) 4 MG tablet, Take 1 tablet (4 mg total) by mouth every 8 (eight) hours as needed for nausea or vomiting., Disp: 20 tablet, Rfl: 0   ondansetron  (ZOFRAN -ODT) 4 MG disintegrating tablet, Take 1 tablet (4 mg total) by mouth every 8 (eight) hours as needed for nausea or vomiting., Disp: 20 tablet, Rfl: 0   promethazine  (PHENERGAN ) 12.5 MG tablet, Take 1 tablet (12.5 mg total) by mouth every 8 (eight) hours as needed for nausea or  vomiting., Disp: 20 tablet, Rfl: 0   pseudoephedrine  (SUDAFED) 60 MG tablet, Take 1 tablet (60 mg total) by mouth every 8 (eight) hours as needed for congestion., Disp: 30 tablet, Rfl: 0   RABEprazole  (ACIPHEX ) 20 MG tablet,  Take 1 tablet (20 mg total) by mouth 2 (two) times daily., Disp: 60 tablet, Rfl: 6   sucralfate  (CARAFATE ) 1 GM/10ML suspension, Take 10 mLs (1 g total) by mouth 4 (four) times daily -  with meals and at bedtime., Disp: 420 mL, Rfl: 2   tiZANidine  (ZANAFLEX ) 4 MG tablet, Take 1 tablet (4 mg total) by mouth every 8 (eight) hours as needed for muscle spasms., Disp: 30 tablet, Rfl: 0   traMADol  (ULTRAM ) 50 MG tablet, Take 1 tablet (50 mg total) by mouth every 8 (eight) hours as needed., Disp: 60 tablet, Rfl: 0   traZODone  (DESYREL ) 100 MG tablet, Take 1 tablet (100 mg total) by mouth at bedtime., Disp: 30 tablet, Rfl: 1   tretinoin  (RETIN-A ) 0.025 % cream, Apply a pea sized amount to the entire face QHS., Disp: 45 g, Rfl: 3 Allergies  Allergen Reactions   Wheat Extract Swelling   Family History  Problem Relation Age of Onset   High Cholesterol Mother    Hypertension Mother    Arthritis/Rheumatoid Mother    Pancreatic cancer Mother 2   Alcohol abuse Father    Hypertension Maternal Grandmother    Uterine cancer Maternal Grandmother 63   Ovarian cancer Cousin        before age 36   Colon cancer Neg Hx    Colon polyps Neg Hx    Inflammatory bowel disease Neg Hx    Esophageal cancer Neg Hx    Liver disease Neg Hx    Rectal cancer Neg Hx    Stomach cancer Neg Hx    Social History   Socioeconomic History   Marital status: Widowed    Spouse name: Not on file   Number of children: 2   Years of education: Not on file   Highest education level: Not on file  Occupational History   Not on file  Tobacco Use   Smoking status: Never   Smokeless tobacco: Never  Vaping Use   Vaping status: Never Used  Substance and Sexual Activity   Alcohol use: Yes    Alcohol/week:  2.0 - 3.0 standard drinks of alcohol    Types: 2 - 3 Glasses of wine per week    Comment: occ   Drug use: No   Sexual activity: Not Currently    Birth control/protection: Post-menopausal  Other Topics Concern   Not on file  Social History Narrative   Not on file   Social Drivers of Health   Financial Resource Strain: Low Risk  (09/17/2021)   Overall Financial Resource Strain (CARDIA)    Difficulty of Paying Living Expenses: Not hard at all  Food Insecurity: No Food Insecurity (09/17/2021)   Hunger Vital Sign    Worried About Running Out of Food in the Last Year: Never true    Ran Out of Food in the Last Year: Never true  Transportation Needs: Unmet Transportation Needs (09/17/2021)   PRAPARE - Administrator, Civil Service (Medical): Yes    Lack of Transportation (Non-Medical): No  Physical Activity: Insufficiently Active (09/17/2021)   Exercise Vital Sign    Days of Exercise per Week: 7 days    Minutes of Exercise per Session: 10 min  Stress: Stress Concern Present (09/17/2021)   [employer] of Occupational Health - Occupational Stress Questionnaire    Feeling of Stress : Very much  Social Connections: Unknown (01/06/2022)   Received from [facility]   Social Network    Social Network: Not on  file  Intimate Partner Violence: Unknown (11/28/2021)   [facility]   HITS    Physically Hurt: Not on file    Insult or Talk Down To: Not on file    Threaten Physical Harm: Not on file    Scream or Curse: Not on file    Physical Exam: There were no vitals filed for this visit. There is no height or weight on file to calculate BMI. GEN: NAD EYE: Sclerae anicteric ENT: MMM CV: Non-tachycardic GI: 4-5/10 pain in upper abdomen preprocedure  NEURO:  Alert & Oriented x 3  Lab Results: No results for input(s): WBC, HGB, HCT, PLT in the last 72 hours. BMET No results for input(s): NA, K, CL, CO2, GLUCOSE, BUN, CREATININE,  CALCIUM in the last 72 hours. LFT No results for input(s): PROT, ALBUMIN, AST, ALT, ALKPHOS, BILITOT, BILIDIR, IBILI in the last 72 hours. PT/INR No results for input(s): LABPROT, INR in the last 72 hours.   Impression / Plan: This is a 68 y.o.female who presents for EGD for evaluation of abdominal pain and prior history of duodenal stricturing.  The risks and benefits of endoscopic evaluation/treatment were discussed with the patient and/or family; these include but are not limited to the risk of perforation, infection, bleeding, missed lesions, lack of diagnosis, severe illness requiring hospitalization, as well as anesthesia and sedation related illnesses.  The patient's history has been reviewed, patient examined, no change in status, and deemed stable for procedure.  The patient and/or family is agreeable to proceed.    Aloha Finner, MD [facility]oscopy Office # [phone]

## 2024-05-24 NOTE — Progress Notes (Signed)
 Called to room to assist during endoscopic procedure.  Patient ID and intended procedure confirmed with present staff. Received instructions for my participation in the procedure from the performing physician.

## 2024-05-24 NOTE — Op Note (Signed)
 [facility] Patient Name: Maria Murphy Procedure Date: 05/24/2024 4:07 PM MRN: [id number] Endoscopist: Aloha Finner , MD, [id number] Age: 68 Referring MD:  Date of Birth: [birth year] Gender: Female Account #: [id number] Procedure:                Upper GI endoscopy Indications:              Epigastric abdominal pain, Abdominal pain in the                            right upper quadrant, Heartburn, Exclusion of                            duodenal stenosis, Follow-up of duodenal stenosis Medicines:                Monitored Anesthesia Care Procedure:                Pre-Anesthesia Assessment:                           - Prior to the procedure, a History and Physical                            was performed, and patient medications and                            allergies were reviewed. The patient's tolerance of                            previous anesthesia was also reviewed. The risks                            and benefits of the procedure and the sedation                            options and risks were discussed with the patient.                            All questions were answered, and informed consent                            was obtained. Prior Anticoagulants: The patient has                            taken no anticoagulant or antiplatelet agents. ASA                            Grade Assessment: III - A patient with severe                            systemic disease. After reviewing the risks and                            benefits, the patient was deemed in satisfactory  condition to undergo the procedure.                           After obtaining informed consent, the endoscope was                            passed under direct vision. Throughout the                            procedure, the patient's blood pressure, pulse, and                            oxygen saturations were monitored continuously. The                             GIF [id number] #[id number] was introduced through the                            mouth, and advanced to the second part of duodenum.                            The upper GI endoscopy was accomplished without                            difficulty. The patient tolerated the procedure. Scope In: Scope Out: Findings:                 No gross lesions were noted in the proximal                            esophagus and in the mid esophagus.                           LA Grade B (one or more mucosal breaks greater than                            5 mm, not extending between the tops of two mucosal                            folds) esophagitis with no bleeding was found in                            the distal esophagus.                           The Z-line was irregular and was found 34 cm from                            the incisors.                           A 5 cm hiatal hernia was present.  An angulation deformity was found in the gastric                            antrum/prepyloric region of the stomach.                           Patchy mildly erythematous mucosa without bleeding                            was found in the entire examined stomach. Biopsies                            were taken with a cold forceps for histology and                            Helicobacter pylori testing.                           An acquired benign-appearing, intrinsic mild                            stenosis was found in the duodenal bulb and was                            traversed. Approximately 14 mm in size. Biopsies                            were taken with a cold forceps for histology. A TTS                            dilator was passed through the scope. Dilation with                            a 12-13.5-15 mm and a 15-16.5-18 mm pyloric balloon                            dilator was performed (up to maximim of 18 mm). The                            dilation site was examined and showed  only after                            the 18 mm dilation a mild mucosal disruption, mild                            improvement in luminal narrowing and no perforation.                           A small diverticulum was found in the duodenal                            sweep.  No gross lesions were noted in the second portion                            of the duodenum. Complications:            No immediate complications. Estimated Blood Loss:     Estimated blood loss was minimal. Impression:               - No gross lesions in the proximal esophagus and in                            the mid esophagus.                           - LA Grade B erosive esophagitis with no bleeding                            found distally.                           - Z-line irregular, 34 cm from the incisors.                           - 5 cm hiatal hernia.                           - Angulation deformity in the gastric                            antrum/prepyloric region of the stomach.                           - Erythematous mucosa in the stomach. Biopsied.                           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 Biopsied.                            Dilated to 18 mm with mild mucosal wrent.                           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                           - No gross lesions in the second portion of the                            duodenum. Recommendation:           - The patient will be observed post-procedure,                            until all discharge criteria are met.                           - Discharge patient to home.                           -  Patient has a contact number available for                            emergencies. The signs and symptoms of potential                            delayed complications were discussed with the                            patient. Return to normal activities tomorrow.                             Written discharge instructions were provided to the                            patient.                           - Resume previous diet.                           - Continue present medications.                           - Await pathology results.                           - Repeat upper endoscopy in 4 months to check                            healing of esophagitis.                           - Referral for cholecystectomy should be considered                            now that we have ruled out chronic PUD.                           - Switch to Voquenza in the setting of her findings                            of esophagitis on high dose PPI therapy and                            Carafate  therapy already. May need prior                            authorization. Until this can be obtained continue                            current PPI twice daily.                           - Observe patient's clinical course.                           -  The findings and recommendations were discussed                            with the patient. Aloha Finner, MD 05/24/2024 4:56:03 PM

## 2024-05-24 NOTE — Progress Notes (Signed)
 Report to PACU, RN, vss, BBS= Clear.

## 2024-05-24 NOTE — Patient Instructions (Signed)
 YOU HAD AN ENDOSCOPIC PROCEDURE TODAY AT THE [facility]:   Refer to the procedure report that was given to you for any specific questions about what was found during the examination.  If the procedure report does not answer your questions, please call your gastroenterologist to clarify.  If you requested that your care partner not be given the details of your procedure findings, then the procedure report has been included in a sealed envelope for you to review at your convenience later.  YOU SHOULD EXPECT: Some feelings of bloating in the abdomen. Passage of more gas than usual.  Walking can help get rid of the air that was put into your GI tract during the procedure and reduce the bloating. If you had a lower endoscopy (such as a colonoscopy or flexible sigmoidoscopy) you may notice spotting of blood in your stool or on the toilet paper. If you underwent a bowel prep for your procedure, you may not have a normal bowel movement for a few days.  Please Note:  You might notice some irritation and congestion in your nose or some drainage.  This is from the oxygen used during your procedure.  There is no need for concern and it should clear up in a day or so.  SYMPTOMS TO REPORT IMMEDIATELY:  Following upper endoscopy (EGD)  Vomiting of blood or coffee ground material  New chest pain or pain under the shoulder blades  Painful or persistently difficult swallowing  New shortness of breath  Fever of 100F or higher  Black, tarry-looking stools  For urgent or emergent issues, a gastroenterologist can be reached at any hour by calling [phone]. Do not use MyChart messaging for urgent concerns.    DIET:  We do recommend a small meal at first, but then you may proceed to your regular diet.  Drink plenty of fluids but you should avoid alcoholic beverages for 24 hours.  ACTIVITY:  You should plan to take it easy for the rest of today and you should NOT DRIVE or use heavy machinery until  tomorrow (because of the sedation medicines used during the test).    FOLLOW UP: Our staff will call the number listed on your records the next business day following your procedure.  We will call around 7:15- 8:00 am to check on you and address any questions or concerns that you may have regarding the information given to you following your procedure. If we do not reach you, we will leave a message.     If any biopsies were taken you will be contacted by phone or by letter within the next 1-3 weeks.  Please call us  at [phone] if you have not heard about the biopsies in 3 weeks.    SIGNATURES/CONFIDENTIALITY: You and/or your care partner have signed paperwork which will be entered into your electronic medical record.  These signatures attest to the fact that that the information above on your After Visit Summary has been reviewed and is understood.  Full responsibility of the confidentiality of this discharge information lies with you and/or your care-partner.

## 2024-05-24 NOTE — Progress Notes (Signed)
 Pt's states no medical or surgical changes since previsit or office visit.

## 2024-05-25 ENCOUNTER — Telehealth: Payer: Self-pay

## 2024-05-25 ENCOUNTER — Other Ambulatory Visit (HOSPITAL_COMMUNITY): Payer: Self-pay

## 2024-05-25 MED ORDER — DEXLANSOPRAZOLE 30 MG PO CPDR: 30.0000 mg | DELAYED_RELEASE_CAPSULE | Freq: Two times a day (BID) | ORAL | 6 refills | Status: AC

## 2024-05-25 NOTE — Telephone Encounter (Signed)
 Let's try Dexilant 30 mg BID in this case (60/6). Thanks. GM

## 2024-05-25 NOTE — Telephone Encounter (Signed)
 Pharmacy Patient Advocate Encounter   Received notification from CoverMyMeds that prior authorization for Voquezna 20MG  tablets is required/requested.   Insurance verification completed.   The patient is insured through [facility].   Per test claim: PA required; PA submitted to above mentioned insurance via Latent Key/confirmation #/EOC [facility] Status is pending

## 2024-05-25 NOTE — Telephone Encounter (Signed)
 Pharmacy Patient Advocate Encounter   Received notification from CoverMyMeds that prior authorization for Dexlansoprazole 30MG  dr capsules is required/requested.   Insurance verification completed.   The patient is insured through [facility].   Per test claim: PA required; PA submitted to above mentioned insurance via Latent Key/confirmation #/EOC [id number] Status is pending

## 2024-05-25 NOTE — Telephone Encounter (Signed)
 Referral has been made with records faxed.

## 2024-05-25 NOTE — Telephone Encounter (Signed)
Prescription has been sent to the pharmacy as ordered.

## 2024-05-25 NOTE — Telephone Encounter (Signed)
 Pharmacy Patient Advocate Encounter  Received notification from [facility] [employer] that Prior Authorization for Voquezna 20MG  tablets has been DENIED.  Full denial letter will be uploaded to the media tab. See denial reason below.  Voquezna is denied because it is not on your plan's Drug List (formulary). Medication authorization requires the following:  (1) You need to try two (2) of these covered drugs:  (A) Dexlansoprazole (B) Esomeprazole magnesium  (C) Omeprazole (2) OR your doctor needs to give us  specific medical reasons why two (2) of the covered drug(s) are not appropriate for you  PA #/Case ID/Reference #: [id number]

## 2024-05-25 NOTE — Telephone Encounter (Signed)
-----   Message from [facility] sent at 05/24/2024  5:01 PM EDT ----- Regarding: Referral for cholecystectomy Maria Murphy, Please place nonurgent referral for cholecystectomy consideration to our [facility] surgeons. Dx abdominal pain and cholelithiasis. Thanks. GM

## 2024-05-25 NOTE — Telephone Encounter (Signed)
  Follow up Call-     05/24/2024    3:16 PM 10/30/2021    2:24 PM  Call back number  Post procedure Call Back phone  # [phone]  Permission to leave phone message Yes Yes     Patient questions:  Do you have a fever, pain , or abdominal swelling? No. Pain Score  0 *  Have you tolerated food without any problems? Yes.    Have you been able to return to your normal activities? Yes.    Do you have any questions about your discharge instructions: Diet   No. Medications  No. Follow up visit  No.  Do you have questions or concerns about your Care? No.  Actions: * If pain score is 4 or above: No action needed, pain <4.

## 2024-05-26 ENCOUNTER — Other Ambulatory Visit (HOSPITAL_COMMUNITY): Payer: Self-pay

## 2024-05-26 NOTE — Telephone Encounter (Signed)
 Pharmacy Patient Advocate Encounter  Received notification from [facility] [employer] that Prior Authorization for Dexlansoprazole 30MG  dr capsules has been APPROVED from 05-25-2024 to 08-23-2025. Ran test claim, Copay is $0.00. This test claim was processed through [facility]- copay amounts may vary at other pharmacies due to pharmacy/plan contracts, or as the patient moves through the different stages of their insurance plan.   PA #/Case ID/Reference #: [id number]

## 2024-05-29 LAB — SURGICAL PATHOLOGY

## 2024-05-30 ENCOUNTER — Ambulatory Visit: Payer: Self-pay | Admitting: Gastroenterology

## 2024-06-02 ENCOUNTER — Ambulatory Visit (INDEPENDENT_AMBULATORY_CARE_PROVIDER_SITE_OTHER): Admitting: Podiatry

## 2024-06-02 ENCOUNTER — Encounter: Payer: Self-pay | Admitting: Podiatry

## 2024-06-02 ENCOUNTER — Ambulatory Visit (INDEPENDENT_AMBULATORY_CARE_PROVIDER_SITE_OTHER)

## 2024-06-02 DIAGNOSIS — M21619 Bunion of unspecified foot: Secondary | ICD-10-CM | POA: Diagnosis not present

## 2024-06-02 DIAGNOSIS — S90212A Contusion of left great toe with damage to nail, initial encounter: Secondary | ICD-10-CM

## 2024-06-02 DIAGNOSIS — M21612 Bunion of left foot: Secondary | ICD-10-CM | POA: Diagnosis not present

## 2024-06-02 NOTE — Progress Notes (Signed)
 Subjective:   Patient ID: Maria Murphy, female   DOB: 68 y.o.   MRN: [id number]   HPI Patient states she traumatized her left big toenail and she is actually here to discuss bunion correction of her left foot.  States that the right foot is doing well from surgery that we did early this year but the left one bothers her more   ROS      Objective:  Physical Exam  Neurovascular status intact with the patient's left hallux nail having been traumatized with contusion with discoloration of the nailbed no active drainage no proximal edema erythema noted.  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  I explained to her these differences and she is more interested in a Lapidus or fusion of the first MPJ and I am referring her to another physician in 2 weeks to discuss.  He will review her overall health history and again she did do well with the right 1 but this when she will be nonweightbearing for several weeks.  Nail seems to be doing well and advised on the continuation of soaks and bandage usage from trauma  X-rays left indicate that there is no contusion or trauma to the left hallux

## 2024-06-09 ENCOUNTER — Ambulatory Visit (INDEPENDENT_AMBULATORY_CARE_PROVIDER_SITE_OTHER)

## 2024-06-09 DIAGNOSIS — M205X2 Other deformities of toe(s) (acquired), left foot: Secondary | ICD-10-CM

## 2024-06-09 DIAGNOSIS — M19072 Primary osteoarthritis, left ankle and foot: Secondary | ICD-10-CM

## 2024-06-09 DIAGNOSIS — M21612 Bunion of left foot: Secondary | ICD-10-CM | POA: Diagnosis not present

## 2024-06-09 NOTE — Progress Notes (Signed)
 Subjective:  Patient ID: Maria Murphy, female    DOB: August 14, 1956,  MRN: [id number]  Chief Complaint  Patient presents with   Bunions    Rm21 Patient complains of bunion pain right foot/ hurts with shoe gear and walking/  MTP fusion, poss DPN neurectomy  68 y.o. female presents with the above complaint. She has had pain to her left big toe joint for an extended period of time.  She complains of pain with range of motion and with pressure to the medial eminence.  Separately, she does relate to some pain in her midfoot that she attributes to arthritis.  She has attempted orthotics, different shoe gear to accommodate the bunion and decrease pain with range of motion. She has not found relief.  Review of Systems: Negative except as noted in the HPI. Denies N/V/F/Ch.  Past Medical History:  Diagnosis Date   Acute saddle pulmonary embolism without acute cor pulmonale (HCC)    Basal cell carcinoma 01/16/2021   L nose, MOHs 04/14/21   Collagen vascular disease    Duodenal ulcer    Essential hypertension, benign 01/24/2015   Family history of ovarian cancer 01/06/2021   Family history of pancreatic cancer 01/06/2021   Family history of uterine cancer 01/06/2021   Foot ulcer (HCC)    GERD (gastroesophageal reflux disease)    Hypertension    Memory loss    Peptic ulcer with perforation (HCC)    Perforated abdominal viscus 04/22/2020   PUD (peptic ulcer disease)    with perforation s/p exploratory laparotomy   Pulmonary embolus (HCC) 04/2020   Skin cancer    Suicidal ideations 08/07/2020   Vision abnormalities     Current Outpatient Medications:    acetaminophen  (TYLENOL ) 500 MG tablet, Take 1 tablet (500 mg total) by mouth every 6 (six) hours as needed for mild pain or fever., Disp: , Rfl:    albuterol  (VENTOLIN  HFA) 108 (90 Base) MCG/ACT inhaler, Inhale 1-2 puffs into the lungs every 6 (six) hours as needed for wheezing or shortness of breath., Disp: 1 each, Rfl: 0   amLODipine   (NORVASC ) 10 MG tablet, Take 1 tablet (10 mg total) by mouth daily. For BP (Patient taking differently: Take 10 mg by mouth as needed (elevated bp). For BP), Disp: 90 tablet, Rfl: 3   Ascorbic Acid (VITAMIN C PO), Take 1 tablet by mouth daily., Disp: , Rfl:    benzonatate  (TESSALON ) 100 MG capsule, Take 1 capsule (100 mg total) by mouth every 8 (eight) hours. (Patient taking differently: Take 100 mg by mouth as needed for cough.), Disp: 21 capsule, Rfl: 0   carvedilol  (COREG ) 12.5 MG tablet, Take 1 tablet (12.5 mg total) by mouth 2 (two) times daily with a meal., Disp: 60 tablet, Rfl: 3   citalopram  (CELEXA ) 40 MG tablet, Take 1 tablet (40 mg total) by mouth at bedtime., Disp: 90 tablet, Rfl: 2   Dexlansoprazole (DEXILANT) 30 MG capsule DR, Take 1 capsule (30 mg total) by mouth 2 (two) times daily., Disp: 60 capsule, Rfl: 6   donepezil  (ARICEPT ) 10 MG tablet, TAKE 1 TABLET BY MOUTH IN THE MORNING AND AT BEDTIME, Disp: 180 tablet, Rfl: 0   DULoxetine  HCl 40 MG CPEP, Take 1 capsule by mouth daily., Disp: 60 capsule, Rfl: 4   furosemide  (LASIX ) 20 MG tablet, Take 20 mg by mouth as needed for fluid or edema., Disp: , Rfl:    gabapentin  (NEURONTIN ) 300 MG capsule, Take 1 capsule (300 mg total) by mouth 2 (two)  times daily. (Patient taking differently: Take 300 mg by mouth as needed (nerve pain).), Disp: 60 capsule, Rfl: 6   lactose free nutrition (BOOST) LIQD, Take 237 mLs by mouth 2 (two) times daily. Boost Breeze Clear, Disp: , Rfl:    lansoprazole  (PREVACID  SOLUTAB) 30 MG disintegrating tablet, Place 1 tablet on tongue twice daily and allow to dissolve until particles can be swallowed. Do not swallow whole, break, cut, or chew tablet., Disp: 60 tablet, Rfl: 3   lisinopril -hydrochlorothiazide  (ZESTORETIC ) 10-12.5 MG tablet, Take 1 tablet by mouth daily., Disp: 90 tablet, Rfl: 1   loperamide  (IMODIUM ) 2 MG capsule, Take 1 capsule (2 mg total) by mouth 4 (four) times daily as needed for diarrhea or loose  stools., Disp: 12 capsule, Rfl: 0   meclizine (ANTIVERT) 25 MG tablet, Take 25 mg by mouth as needed for dizziness., Disp: , Rfl:    ondansetron  (ZOFRAN -ODT) 4 MG disintegrating tablet, Take 1 tablet (4 mg total) by mouth every 8 (eight) hours as needed for nausea or vomiting., Disp: 20 tablet, Rfl: 0   promethazine  (PHENERGAN ) 12.5 MG tablet, Take 1 tablet (12.5 mg total) by mouth every 8 (eight) hours as needed for nausea or vomiting., Disp: 20 tablet, Rfl: 0   pseudoephedrine  (SUDAFED) 60 MG tablet, Take 1 tablet (60 mg total) by mouth every 8 (eight) hours as needed for congestion., Disp: 30 tablet, Rfl: 0   sucralfate  (CARAFATE ) 1 GM/10ML suspension, Take 10 mLs (1 g total) by mouth 4 (four) times daily -  with meals and at bedtime., Disp: 420 mL, Rfl: 2   tiZANidine  (ZANAFLEX ) 4 MG tablet, Take 1 tablet (4 mg total) by mouth every 8 (eight) hours as needed for muscle spasms., Disp: 30 tablet, Rfl: 0   traMADol  (ULTRAM ) 50 MG tablet, Take 1 tablet (50 mg total) by mouth every 8 (eight) hours as needed., Disp: 60 tablet, Rfl: 0   traZODone  (DESYREL ) 100 MG tablet, Take 1 tablet (100 mg total) by mouth at bedtime., Disp: 30 tablet, Rfl: 1   tretinoin  (RETIN-A ) 0.025 % cream, Apply a pea sized amount to the entire face QHS., Disp: 45 g, Rfl: 3   Vonoprazan Fumarate (VOQUEZNA) 20 MG TABS, Take 20 mg by mouth daily., Disp: 30 tablet, Rfl: 6  Social History   Tobacco Use  Smoking Status Never  Smokeless Tobacco Never    Allergies  Allergen Reactions   Wheat Extract Swelling   Objective:  There were no vitals filed for this visit. There is no height or weight on file to calculate BMI. Constitutional Well developed. Well nourished. Oriented to person, place, and time.  Vascular Dorsalis pedis pulses palpable bilaterally. Posterior tibial pulses palpable bilaterally. Capillary refill normal to all digits.  No cyanosis or clubbing noted. Pedal hair growth normal.  Neurologic Normal  speech. Epicritic sensation to light touch grossly present bilaterally. Negative tinel sign at tarsal tunnel bilaterally.   Dermatologic Skin texture and turgor are within normal limits.  No open wounds. No skin lesions.  Musculoskeletal: Decreased right 1st MTP joint ROM. 10 degrees dorsiflexion, 20 degrees plantarflexion left. Rectus hindfoot.  Hallux abductovalgus deformity present Left 1st MPJ diminished range with pain  RighLeftt 1st TMT without gross hypermobility. Lesser digital contractures absent right. Pain to palpation of 2nd and 3rd tarsometatarsal joints. No pain with 2nd and 3rd MTP ROM.    Radiographs: Previously taken left foot XR reviewed.  Elevated intermetatarsal 1-2 angle indicative of hallux abductovalgus deformity.  Joint space narrowing of the  first metatarsophalangeal joint with osteophyte formation and subchondral sclerosis consistent with arthritis.  Mild joint space narrowing with subchondral sclerosis of the 2nd and 3rd tarsometatarsal joints.  Rectus foot shape.  Assessment:   1. Acquired hallux limitus of left foot   2. Bunion, left foot   3. Arthritis of left midfoot    Plan:  Patient was evaluated and treated and all questions answered.  Hallux abductovalgus deformity/Hallux limitus, left -XR as above. -Conservative and surgical treatment options were discussed with the patient.  Patient feels that she has exhausted conservative treatments at this time.  She request surgical intervention.  I do believe that this is reasonable given her deformity, arthritis and attempted conservative treatment.  Midfoot arthritis, left - X-ray as above. - Discussed with the patient the diagnosis of midfoot arthritis.  We discussed different treatment options ranging from conservative to surgical. - Discussed the indication for deep peroneal neurectomy diagnostic block to evaluate possible pain relief that the deep peroneal neurectomy could provide her.  Performed an  injection today to her deep peroneal nerve utilizing 1.5 cc 2% lidocaine  plain and 1.5 cc 0.5% Marcaine  plain.  She experienced immediate pain relief.  She is going to consider if she would like to have this procedure done while we are addressing her hallux abductovalgus/hallux limitus.  - Informed surgical risk consent was reviewed and read aloud to the patient.  I reviewed the imaging.  I have discussed my findings with the patient in great detail.  I have discussed all risks including but not limited to infection, stiffness, scarring, limp, disability, deformity, damage to blood vessels and nerves, numbness, poor healing, need for braces, arthritis, chronic pain, amputation, and death.  All benefits and realistic expectations discussed in great detail.  I have made no promises as to the outcome.  I have provided realistic expectations.  I have offered the patient a 2nd opinion, which they have declined and assured me they preferred to proceed despite the risks.  Planned procedures: Left 1st metatarsophalangeal joint arthrodesis, possible left deep peroneal neurectomy  Post-operative weight bearing status: WB for pivots/transfers onlyin CAM walker. Plan to progress to surgical shoe at week 3-4.   VTE risk assessment/need for prophylaxis: Lovenox  40mg  qd x 30 days  Post-operative pain management: Oxycodone /Acetaminophen  5mg /325mg  q6h PRN  Patient risk factors that were discussed: None identified   RTC 1 week after surgery  Prentice Ovens, DPM AACFAS Fellowship Trained Podiatric Surgeon [facility]

## 2024-06-12 ENCOUNTER — Telehealth: Payer: Self-pay

## 2024-06-12 NOTE — Telephone Encounter (Signed)
 Called and left message for patient to contact back to schedule surgery.
